# Patient Record
Sex: Male | Born: 1940
Health system: Southern US, Community
[De-identification: ages and names within clinical notes are randomized; demographics above are authoritative.]

## PROBLEM LIST (undated history)

## (undated) DIAGNOSIS — K5790 Diverticulosis of intestine, part unspecified, without perforation or abscess without bleeding: Secondary | ICD-10-CM

## (undated) DIAGNOSIS — Z9889 Other specified postprocedural states: Secondary | ICD-10-CM

## (undated) DIAGNOSIS — K635 Polyp of colon: Secondary | ICD-10-CM

## (undated) DIAGNOSIS — Z950 Presence of cardiac pacemaker: Secondary | ICD-10-CM

## (undated) DIAGNOSIS — R627 Adult failure to thrive: Secondary | ICD-10-CM

## (undated) DIAGNOSIS — I480 Paroxysmal atrial fibrillation: Secondary | ICD-10-CM

## (undated) DIAGNOSIS — G5603 Carpal tunnel syndrome, bilateral upper limbs: Secondary | ICD-10-CM

## (undated) DIAGNOSIS — E119 Type 2 diabetes mellitus without complications: Secondary | ICD-10-CM

## (undated) DIAGNOSIS — I639 Cerebral infarction, unspecified: Secondary | ICD-10-CM

## (undated) DIAGNOSIS — K279 Peptic ulcer, site unspecified, unspecified as acute or chronic, without hemorrhage or perforation: Secondary | ICD-10-CM

## (undated) DIAGNOSIS — I2510 Atherosclerotic heart disease of native coronary artery without angina pectoris: Secondary | ICD-10-CM

## (undated) DIAGNOSIS — I509 Heart failure, unspecified: Secondary | ICD-10-CM

## (undated) DIAGNOSIS — K2270 Barrett's esophagus without dysplasia: Secondary | ICD-10-CM

## (undated) DIAGNOSIS — N186 End stage renal disease: Secondary | ICD-10-CM

## (undated) DIAGNOSIS — D649 Anemia, unspecified: Secondary | ICD-10-CM

## (undated) DIAGNOSIS — Z992 Dependence on renal dialysis: Secondary | ICD-10-CM

## (undated) DIAGNOSIS — K219 Gastro-esophageal reflux disease without esophagitis: Secondary | ICD-10-CM

## (undated) DIAGNOSIS — Z92241 Personal history of systemic steroid therapy: Secondary | ICD-10-CM

## (undated) DIAGNOSIS — E785 Hyperlipidemia, unspecified: Secondary | ICD-10-CM

## (undated) DIAGNOSIS — E1142 Type 2 diabetes mellitus with diabetic polyneuropathy: Secondary | ICD-10-CM

## (undated) DIAGNOSIS — C801 Malignant (primary) neoplasm, unspecified: Secondary | ICD-10-CM

## (undated) DIAGNOSIS — D126 Benign neoplasm of colon, unspecified: Secondary | ICD-10-CM

## (undated) DIAGNOSIS — R112 Nausea with vomiting, unspecified: Secondary | ICD-10-CM

## (undated) DIAGNOSIS — F32A Depression, unspecified: Secondary | ICD-10-CM

## (undated) DIAGNOSIS — Z87442 Personal history of urinary calculi: Secondary | ICD-10-CM

## (undated) DIAGNOSIS — H3530 Unspecified macular degeneration: Secondary | ICD-10-CM

## (undated) DIAGNOSIS — H409 Unspecified glaucoma: Secondary | ICD-10-CM

## (undated) DIAGNOSIS — I350 Nonrheumatic aortic (valve) stenosis: Secondary | ICD-10-CM

## (undated) DIAGNOSIS — R0600 Dyspnea, unspecified: Secondary | ICD-10-CM

## (undated) DIAGNOSIS — R001 Bradycardia, unspecified: Secondary | ICD-10-CM

## (undated) DIAGNOSIS — I10 Essential (primary) hypertension: Secondary | ICD-10-CM

## (undated) DIAGNOSIS — I951 Orthostatic hypotension: Secondary | ICD-10-CM

## (undated) DIAGNOSIS — F329 Major depressive disorder, single episode, unspecified: Secondary | ICD-10-CM

## (undated) DIAGNOSIS — H548 Legal blindness, as defined in USA: Secondary | ICD-10-CM

## (undated) DIAGNOSIS — J302 Other seasonal allergic rhinitis: Secondary | ICD-10-CM

## (undated) HISTORY — DX: Cerebral infarction, unspecified: I63.9

## (undated) HISTORY — DX: Hyperlipidemia, unspecified: E78.5

## (undated) HISTORY — DX: Gastro-esophageal reflux disease without esophagitis: K21.9

## (undated) HISTORY — PX: BACK SURGERY: SHX140

## (undated) HISTORY — DX: Peptic ulcer, site unspecified, unspecified as acute or chronic, without hemorrhage or perforation: K27.9

## (undated) HISTORY — DX: Orthostatic hypotension: I95.1

## (undated) HISTORY — DX: Other seasonal allergic rhinitis: J30.2

## (undated) HISTORY — DX: Personal history of systemic steroid therapy: Z92.241

## (undated) HISTORY — DX: Type 2 diabetes mellitus with diabetic polyneuropathy: E11.42

## (undated) HISTORY — DX: Benign neoplasm of colon, unspecified: D12.6

## (undated) HISTORY — DX: Unspecified macular degeneration: H35.30

## (undated) HISTORY — DX: Barrett's esophagus without dysplasia: K22.70

## (undated) HISTORY — DX: Carpal tunnel syndrome, bilateral upper limbs: G56.03

## (undated) HISTORY — DX: Essential (primary) hypertension: I10

## (undated) HISTORY — PX: CYSTOSCOPY: SUR368

## (undated) HISTORY — DX: Major depressive disorder, single episode, unspecified: F32.9

## (undated) HISTORY — DX: Polyp of colon: K63.5

## (undated) HISTORY — DX: Diverticulosis of intestine, part unspecified, without perforation or abscess without bleeding: K57.90

## (undated) HISTORY — DX: Nonrheumatic aortic (valve) stenosis: I35.0

## (undated) HISTORY — DX: Depression, unspecified: F32.A

## (undated) HISTORY — DX: Anemia, unspecified: D64.9

---

## 1962-03-29 HISTORY — PX: TONSILLECTOMY: SUR1361

## 1967-03-30 HISTORY — PX: LAMINECTOMY: SHX219

## 1997-03-29 HISTORY — PX: CORNEAL TRANSPLANT: SHX108

## 1998-04-23 ENCOUNTER — Ambulatory Visit (HOSPITAL_COMMUNITY): Admission: RE | Admit: 1998-04-23 | Discharge: 1998-04-23 | Payer: Self-pay | Admitting: *Deleted

## 1998-06-19 ENCOUNTER — Ambulatory Visit (HOSPITAL_COMMUNITY): Admission: RE | Admit: 1998-06-19 | Discharge: 1998-06-19 | Payer: Self-pay | Admitting: Specialist

## 1998-06-19 ENCOUNTER — Encounter: Payer: Self-pay | Admitting: Specialist

## 1998-07-14 ENCOUNTER — Ambulatory Visit (HOSPITAL_COMMUNITY): Admission: RE | Admit: 1998-07-14 | Discharge: 1998-07-14 | Payer: Self-pay | Admitting: Specialist

## 1998-07-14 ENCOUNTER — Encounter: Payer: Self-pay | Admitting: Specialist

## 1998-07-29 ENCOUNTER — Ambulatory Visit (HOSPITAL_COMMUNITY): Admission: RE | Admit: 1998-07-29 | Discharge: 1998-07-29 | Payer: Self-pay | Admitting: Specialist

## 1998-07-29 ENCOUNTER — Encounter: Payer: Self-pay | Admitting: Specialist

## 1998-08-12 ENCOUNTER — Encounter: Payer: Self-pay | Admitting: Specialist

## 1998-08-12 ENCOUNTER — Ambulatory Visit (HOSPITAL_COMMUNITY): Admission: RE | Admit: 1998-08-12 | Discharge: 1998-08-12 | Payer: Self-pay | Admitting: Specialist

## 1999-09-17 ENCOUNTER — Encounter: Payer: Self-pay | Admitting: Internal Medicine

## 1999-09-17 ENCOUNTER — Ambulatory Visit (HOSPITAL_COMMUNITY): Admission: RE | Admit: 1999-09-17 | Discharge: 1999-09-17 | Payer: Self-pay | Admitting: Internal Medicine

## 1999-11-16 ENCOUNTER — Encounter: Payer: Self-pay | Admitting: Nephrology

## 1999-11-16 ENCOUNTER — Ambulatory Visit (HOSPITAL_COMMUNITY): Admission: RE | Admit: 1999-11-16 | Discharge: 1999-11-16 | Payer: Self-pay | Admitting: Nephrology

## 2000-06-21 ENCOUNTER — Encounter: Payer: Self-pay | Admitting: Emergency Medicine

## 2000-06-21 ENCOUNTER — Emergency Department (HOSPITAL_COMMUNITY): Admission: EM | Admit: 2000-06-21 | Discharge: 2000-06-21 | Payer: Self-pay | Admitting: Emergency Medicine

## 2000-06-28 ENCOUNTER — Ambulatory Visit (HOSPITAL_COMMUNITY): Admission: RE | Admit: 2000-06-28 | Discharge: 2000-06-28 | Payer: Self-pay | Admitting: Internal Medicine

## 2000-06-28 ENCOUNTER — Encounter: Payer: Self-pay | Admitting: Internal Medicine

## 2001-07-27 DIAGNOSIS — D126 Benign neoplasm of colon, unspecified: Secondary | ICD-10-CM

## 2001-07-27 HISTORY — DX: Benign neoplasm of colon, unspecified: D12.6

## 2001-08-03 ENCOUNTER — Encounter (INDEPENDENT_AMBULATORY_CARE_PROVIDER_SITE_OTHER): Payer: Self-pay | Admitting: Specialist

## 2001-08-03 ENCOUNTER — Ambulatory Visit (HOSPITAL_COMMUNITY): Admission: RE | Admit: 2001-08-03 | Discharge: 2001-08-03 | Payer: Self-pay | Admitting: *Deleted

## 2003-01-24 ENCOUNTER — Inpatient Hospital Stay (HOSPITAL_COMMUNITY): Admission: EM | Admit: 2003-01-24 | Discharge: 2003-01-30 | Payer: Self-pay | Admitting: Emergency Medicine

## 2003-01-25 ENCOUNTER — Encounter: Payer: Self-pay | Admitting: Internal Medicine

## 2003-02-07 ENCOUNTER — Inpatient Hospital Stay (HOSPITAL_COMMUNITY): Admission: EM | Admit: 2003-02-07 | Discharge: 2003-02-10 | Payer: Self-pay | Admitting: Emergency Medicine

## 2003-02-08 ENCOUNTER — Encounter: Payer: Self-pay | Admitting: Cardiology

## 2003-04-26 ENCOUNTER — Encounter: Admission: RE | Admit: 2003-04-26 | Discharge: 2003-04-26 | Payer: Self-pay | Admitting: Internal Medicine

## 2003-04-29 ENCOUNTER — Ambulatory Visit (HOSPITAL_COMMUNITY): Admission: RE | Admit: 2003-04-29 | Discharge: 2003-04-29 | Payer: Self-pay | Admitting: Neurology

## 2003-05-23 ENCOUNTER — Encounter: Admission: RE | Admit: 2003-05-23 | Discharge: 2003-05-23 | Payer: Self-pay | Admitting: *Deleted

## 2003-05-28 DIAGNOSIS — K2270 Barrett's esophagus without dysplasia: Secondary | ICD-10-CM

## 2003-05-28 HISTORY — DX: Barrett's esophagus without dysplasia: K22.70

## 2003-05-29 ENCOUNTER — Encounter: Admission: RE | Admit: 2003-05-29 | Discharge: 2003-07-24 | Payer: Self-pay | Admitting: Internal Medicine

## 2003-06-12 ENCOUNTER — Ambulatory Visit (HOSPITAL_COMMUNITY): Admission: RE | Admit: 2003-06-12 | Discharge: 2003-06-12 | Payer: Self-pay | Admitting: *Deleted

## 2003-06-12 ENCOUNTER — Encounter (INDEPENDENT_AMBULATORY_CARE_PROVIDER_SITE_OTHER): Payer: Self-pay | Admitting: Specialist

## 2004-08-25 ENCOUNTER — Ambulatory Visit (HOSPITAL_COMMUNITY): Admission: RE | Admit: 2004-08-25 | Discharge: 2004-08-25 | Payer: Self-pay | Admitting: *Deleted

## 2004-08-25 ENCOUNTER — Encounter (INDEPENDENT_AMBULATORY_CARE_PROVIDER_SITE_OTHER): Payer: Self-pay | Admitting: *Deleted

## 2004-10-21 ENCOUNTER — Ambulatory Visit: Payer: Self-pay

## 2004-11-14 ENCOUNTER — Encounter: Admission: RE | Admit: 2004-11-14 | Discharge: 2004-11-14 | Payer: Self-pay | Admitting: Neurology

## 2005-08-13 ENCOUNTER — Encounter (INDEPENDENT_AMBULATORY_CARE_PROVIDER_SITE_OTHER): Payer: Self-pay | Admitting: *Deleted

## 2005-08-13 ENCOUNTER — Ambulatory Visit (HOSPITAL_COMMUNITY): Admission: RE | Admit: 2005-08-13 | Discharge: 2005-08-13 | Payer: Self-pay | Admitting: *Deleted

## 2006-11-02 ENCOUNTER — Encounter (INDEPENDENT_AMBULATORY_CARE_PROVIDER_SITE_OTHER): Payer: Self-pay | Admitting: *Deleted

## 2006-11-02 ENCOUNTER — Ambulatory Visit (HOSPITAL_COMMUNITY): Admission: RE | Admit: 2006-11-02 | Discharge: 2006-11-02 | Payer: Self-pay | Admitting: *Deleted

## 2007-03-30 HISTORY — PX: AV FISTULA PLACEMENT: SHX1204

## 2007-08-24 ENCOUNTER — Ambulatory Visit: Payer: Self-pay | Admitting: *Deleted

## 2007-09-18 ENCOUNTER — Ambulatory Visit (HOSPITAL_COMMUNITY): Admission: RE | Admit: 2007-09-18 | Discharge: 2007-09-18 | Payer: Self-pay | Admitting: *Deleted

## 2007-09-18 ENCOUNTER — Ambulatory Visit: Payer: Self-pay | Admitting: *Deleted

## 2007-10-04 ENCOUNTER — Ambulatory Visit: Payer: Self-pay | Admitting: Vascular Surgery

## 2008-03-17 ENCOUNTER — Emergency Department (HOSPITAL_COMMUNITY): Admission: EM | Admit: 2008-03-17 | Discharge: 2008-03-17 | Payer: Self-pay | Admitting: Emergency Medicine

## 2008-09-04 ENCOUNTER — Ambulatory Visit (HOSPITAL_COMMUNITY): Admission: RE | Admit: 2008-09-04 | Discharge: 2008-09-04 | Payer: Self-pay | Admitting: *Deleted

## 2008-09-04 ENCOUNTER — Encounter (INDEPENDENT_AMBULATORY_CARE_PROVIDER_SITE_OTHER): Payer: Self-pay | Admitting: *Deleted

## 2009-04-28 ENCOUNTER — Encounter: Admission: RE | Admit: 2009-04-28 | Discharge: 2009-04-28 | Payer: Self-pay | Admitting: Endocrinology

## 2010-05-21 ENCOUNTER — Encounter (HOSPITAL_COMMUNITY): Payer: Medicare Other | Attending: Nephrology

## 2010-05-21 DIAGNOSIS — D509 Iron deficiency anemia, unspecified: Secondary | ICD-10-CM | POA: Insufficient documentation

## 2010-05-28 ENCOUNTER — Encounter (HOSPITAL_COMMUNITY): Payer: Medicare Other | Attending: Nephrology

## 2010-05-28 DIAGNOSIS — D509 Iron deficiency anemia, unspecified: Secondary | ICD-10-CM | POA: Insufficient documentation

## 2010-07-06 LAB — GLUCOSE, CAPILLARY: Glucose-Capillary: 96 mg/dL (ref 70–99)

## 2010-08-11 NOTE — Assessment & Plan Note (Signed)
OFFICE VISIT   JUNO, BOZARD  DOB:  1940/10/23                                       10/04/2007  [id number]#:[id number]   The patient returns for follow-up today after placement of a left  radiocephalic AV fistula on 09/18/2007.  On exam today, there is an  easily palpable thrill in the fistula.  The vein is maturing well on the  left volar forearm.  He does have a small amount of numbness in his left  thumb.  Hopefully this will resolve over time.  Overall, the fistula  seems to be maturing well.  He will follow up on an as-needed basis.   Janetta Hora. Fields, MD  Electronically Signed   CEF/MEDQ  D:  11/16/2007  T:  11/16/2007  Job:  [id number]

## 2010-08-11 NOTE — Consult Note (Signed)
VASCULAR SURGERY CONSULTATION   MORT, Raiford C  DOB:  09/10/1940                                       08/24/2007  [id number]#:[id number]   REFERRING PHYSICIAN:  Cecille Aver, M.D.   PRIMARY CARE PHYSICIAN:  Soyla Murphy. Renne Crigler, M.D.   REASON FOR CONSULTATION:  Chronic renal insufficiency.   HISTORY:  The patient is a 70 year old gentleman with a long history of  chronic kidney disease.  This is now stage IV.  This has progressed very  slowly.  He does complain of some fatigue.  He has chosen hemodialysis  and is referred at this time for access.   PAST MEDICAL HISTORY:  1. Hypertension.  2. Type 2 diabetes.  3. Hyperlipidemia.  4. Stage IV chronic kidney disease.  5. Anemia.  6. Secondary hyperparathyroidism.  7. CVA.   MEDICATIONS:  1. Micardis 80 mg daily.  2. Clonidine 0.1 mg b.i.d.  3. Prilosec 20 mg daily.  4. Plavix 75 mg daily.  5. Lipitor 40 mg daily.  6. Allegra 180 mg daily.  7. Actos 30 mg daily.  8. Zetia 10 mg daily at bedtime.  9. Fluoxetine 40 mg daily.  10.Furosemide 80 mg daily.  11.Toprol 25 mg daily.  12.Hectorol 0.5 mg b.i.d.  13.Iron 1 tablet daily.   ALLERGIES:  Penicillin which causes hives.   SOCIAL HISTORY:  The patient is married with three grown children.  He  is retired.  Does not smoke.  Discontinued tobacco use 6 years ago.  No  alcohol use.   REVIEW OF SYSTEMS:  Refer to patient encounter form, no major  complaints.   FAMILY HISTORY:  Noncontributory.   PHYSICAL EXAMINATION:  General:  A 70 year old male.  Alert and  oriented.  Mild expressive dysphasia.  No acute distress.  Vital signs:  BP 150/86, pulse 51 per minute.  Extremities:  Upper extremities 2+  brachial, radial pulses bilaterally.   INVESTIGATIONS:  Upper extremity vein mapping reveals patent bilateral  cephalic veins measuring from 4.4-0.3 cm bilaterally.   FINAL IMPRESSION:  1. Chronic kidney disease stage IV.  2. Hypertension.  3.  Hyperlipidemia.  4. Type 2 diabetes.  5. Cerebrovascular accident.   PLAN:  The patient will be scheduled for left Cimino arteriovenous  fistula to be performed 09/18/2007 at [facility].  Discontinue  Plavix 1 week prior to surgery and bridge with aspirin 325 mg daily.   Balinda Quails, M.D.  Electronically Signed  PGH/MEDQ  D:  08/24/2007  T:  08/25/2007  Job:  [id number]   cc:   Soyla Murphy. Renne Crigler, M.D.  Cecille Aver, M.D.

## 2010-08-11 NOTE — Op Note (Signed)
NAMEMONTY, SPICHER NO.:  [id number]   MEDICAL RECORD NO.:  [id number]          PATIENT TYPE:  AMB   LOCATION:  ENDO                         FACILITY:  [facility]   PHYSICIAN:  Georgiana Spinner, M.D.    DATE OF BIRTH:  08-21-1940   DATE OF PROCEDURE:  09/04/2008  DATE OF DISCHARGE:                               OPERATIVE REPORT   PROCEDURE:  Upper endoscopy with biopsy.   INDICATIONS:  Gastroesophageal reflux disease.   ANESTHESIA:  Fentanyl 50 mcg, Versed 5 mg.   PROCEDURE IN DETAIL:  With the patient mildly sedated in the left  lateral decubitus position the Pentax videoscopic endoscope was inserted  in the mouth and passed under direct vision through the esophagus which  appeared normal.  I really could not see a clear evidence of Barrett's  esophagus but I elected to biopsy around the periphery of the  squamocolumnar junction.  The endoscope was then advanced into the  stomach.  Fundus, body, antrum, duodenal bulb, second portion of  duodenum were visualized.  From this point the endoscope was slowly  withdrawn taking circumferential views of duodenal mucosa until the  endoscope had been pulled back into the stomach, placed in retroflexion  to view the stomach from below.  The endoscope was straightened and  withdrawn taking circumferential views of remaining gastric and  esophageal mucosa.  The patient's vital signs and pulse oximeter  remained stable.  The patient tolerated the procedure well without  apparent complication.   FINDINGS:  Question of Barrett's esophagus, biopsied.  Await biopsy  report.  The patient will call me for results and follow up with me as  an outpatient.           ______________________________  Georgiana Spinner, M.D.     GMO/MEDQ  D:  09/04/2008  T:  09/04/2008  Job:  [id number]

## 2010-08-11 NOTE — Procedures (Signed)
CEPHALIC VEIN MAPPING   INDICATION:  End-stage renal disease.   HISTORY:  End-stage renal disease.   EXAM:  Bilateral cephalic vein duplex.   The right cephalic vein is compressible.   Diameter measurements range from 0.33 to 0.64.   The left cephalic vein is compressible.   Diameter measurements range from 0.44 to 0.60   See attached worksheet for all measurements.   IMPRESSION:  Patent bilateral cephalic veins which are of acceptable  diameter for use as a dialysis access site.   ___________________________________________  P. Liliane Bade, M.D.   MG/MEDQ  D:  08/24/2007  T:  08/24/2007  Job:  [id number]

## 2010-08-11 NOTE — Op Note (Signed)
[name], [name] NO.:  [id number]   MEDICAL RECORD NO.:  [id number]          PATIENT TYPE:  AMB   LOCATION:  ENDO                         FACILITY:  [facility]   PHYSICIAN:  Georgiana Spinner, M.D.    DATE OF BIRTH:  [birth year]   DATE OF PROCEDURE:  11/02/2006  DATE OF DISCHARGE:                               OPERATIVE REPORT   PROCEDURE:  Upper endoscopy.   INDICATIONS:  GERD.   ANESTHESIA:  1. Fentanyl 50 mcg.  2. Versed 5 mg.   PROCEDURE:  With the patient mildly sedated in the left lateral  decubitus position, the Pentax videoscopic endoscope was inserted in the  mouth and passed under direct vision through the esophagus, which  appeared normal, except there was maybe one small area of Barrett's that  I could see.  This was photographed and biopsied.  We entered into the  stomach.  The fundus, body, antrum, duodenal bulb, and second portion of  the duodenum were visualized. From this point, the endoscope was slowly  withdrawn, taking circumferential views of the duodenal mucosa until the  endoscope had been pulled back into the stomach and placed in  retroflexion to view the stomach from below. The endoscope was then  straightened and withdrawn, taking circumferential views of the  remaining gastric and esophageal mucosa, stopping in the fundus of the  stomach, where undigested food particles had appeared.  These were  fairly recently and just were seen, photographed, and the fundus was  biopsied because it appeared that it may possibly have some degree of  gastritis.  The patient's vital signs and pulse oximeter remained  stable.  The patient tolerated procedure well, without apparent  complications.   FINDINGS:  Apparent small area of Barrett's esophagus, biopsied.  Gastric biopsies taken.  Await biopsy report.  The patient will call me  for results and follow up with me as an outpatient.           ______________________________  Georgiana Spinner,  M.D.     GMO/MEDQ  D:  11/02/2006  T:  11/02/2006  Job:  [id number]

## 2010-08-11 NOTE — Op Note (Signed)
NAMECHRISTOPHR, CALIX                ACCOUNT NO.:  [id number]   MEDICAL RECORD NO.:  [id number]          PATIENT TYPE:  AMB   LOCATION:  SDS                          FACILITY:  [facility]   PHYSICIAN:  Janetta Hora. Fields, MD  DATE OF BIRTH:  11/01/1940   DATE OF PROCEDURE:  09/18/2007  DATE OF DISCHARGE:  09/18/2007                               OPERATIVE REPORT   PROCEDURE:  Left radiocephalic arteriovenous fistula.   PREOPERATIVE DIAGNOSIS:  End-stage renal disease.   POSTOPERATIVE DIAGNOSIS:  End-stage renal disease.   ANESTHESIA:  Local with IV sedation.   ASSISTANT:  Ezzard Flax, RNFA.   OPERATIVE DETAILS:  After obtaining informed consent, the patient taken  to the operating.  The patient placed in supine position on the  operating table.  After adequate sedation, the patient's entire left  upper extremity was prepped and draped in usual sterile fashion.  Local  anesthesia was infiltrated near the wrist.  A longitudinal incision was  made midway between the cephalic vein and radial artery at the wrist on  the left arm.  Incision was carried down through the subcutaneous  tissues down to the level of cephalic vein.  Cephalic vein was dissected  free circumferentially.  Small side branches were ligated and divided  between silk ties.  Next, the radial artery was dissected free in the  medial portion of the incision.  Vessel loops were placed proximal and  distal to the planned site of arteriotomy.  The patient was then given  5000 units of intravenous heparin.  This cephalic vein was ligated with  a 2-0 silk tie and transected.  It was gently distended with heparinized  saline and found to be 3 to 3.5 mm in diameter.  It was then marked for  orientation and occluded proximally with a fine bulldog clamp.  Vessel  loops were used to control the radial artery.  Longitudinal opening was  made in the radial artery.  The vein was swung over to the level of the  artery and sewn end of  vein to side of artery using a running 7-0  Prolene suture.  Just prior to completion, anastomosis was fore bled,  back bled, and thoroughly flushed.  Anastomosis was secured.  Vessel  loops were released.  There was palpable thrill in the fistula  immediately.  Next, hemostasis was obtained.  Subcutaneous tissues  reapproximated using running 3-0 Vicryl suture.  Skin was closed with 4-  0 Vicryl subcuticular stitch.  The patient tolerated procedure well and  there were no complications.  Instrument, sponge, and needle counts were  correct at the end of the case.  The patient was taken to recovery room  in stable condition.      Janetta Hora. Fields, MD  Electronically Signed     CEF/MEDQ  D:  09/18/2007  T:  09/19/2007  Job:  [id number]

## 2010-08-14 NOTE — Op Note (Signed)
Alexander [name], Alexander [name] NO.:  [id number]   MEDICAL RECORD NO.:  [id number]          PATIENT TYPE:  AMB   LOCATION:  ENDO                         FACILITY:  [facility]   PHYSICIAN:  Georgiana Spinner, M.D.    DATE OF BIRTH:  10-May-1940   DATE OF PROCEDURE:  08/13/2005  DATE OF DISCHARGE:                                 OPERATIVE REPORT   PROCEDURE:  Colonoscopy.   INDICATIONS:  Colon polyps.   ANESTHESIA:  Demerol 60, Versed 8 mg.   PROCEDURE:  With the patient mildly sedated in the left lateral decubitus  position, a rectal examination was attempted.  Subsequently, the Olympus  videoscopic colonoscope was inserted into the rectum and passed under direct  vision to the cecum identified by ileocecal valve and appendiceal orifice,  both of which were photographed.  From this point, the colonoscope was  slowly withdrawn taking circumferential views of colonic mucosa, stopping at  60 cm from anal verge which I judged to be the descending colon, at which  point a polyp was seen photographed and removed using hot biopsy forceps  technique setting of 20/200 blended current.  We next stopped to photograph  diverticulosis seen in the sigmoid colon until we reached the rectum which  appeared normal on direct and showed hemorrhoids on retroflexed view.  The  endoscope was straightened and withdrawn.  The patient's vital signs and  pulse oximeter remained stable.  The patient tolerated the procedure well  without apparent complications.   FINDINGS:  Internal hemorrhoids, diverticulosis of sigmoid colon.  Intermediate size polyp, approximately 7 mm in size, in the descending  colon.   PLAN:  Await biopsy report.  The patient will call me for results and follow  up with me as an outpatient.           ______________________________  Georgiana Spinner, M.D.     GMO/MEDQ  D:  08/13/2005  T:  08/13/2005  Job:  [id number]

## 2010-08-14 NOTE — Procedures (Signed)
[facility]  Patient:    Alexander [name], Alexander [name] Visit Number: [id number] MRN: [id number]          Service Type: END Location: ENDO Attending Physician:  Sabino Gasser Dictated by:   Sabino Gasser, M.D. Proc. Date: 08/03/01 Admit Date:  08/03/2001                             Procedure Report  PROCEDURE:  Colonoscopy.  INDICATIONS:  Colon polyps.  ANESTHESIA:  Demerol 30, Versed 3 mg.  DESCRIPTION OF PROCEDURE:  With the patient mildly sedated in the left lateral decubitus position, the Olympus videoscopic colonoscope was inserted into the rectum and passed under direct vision to the cecum, identified by the ileocecal valve and appendiceal orifice.  From this point, the colonoscope was slowly withdrawn, taking  circumferential views of the remaining colonic mucosa, stopping only first in the ascending colon where a small polyp was seen and removed using hot biopsy forceps technique, setting of 20-20 blended current, in the proximal transverse colon where a slightly larger polyp was seen and photographed and removed using snare cautery technique, again a setting of 20-20 blended current.  This was done until we reached the rectum which showed changes of hemorrhoids on retroflex view.  The endoscope was straightened and withdrawn.  The patients vital signs and pulse oximeter remained stable.  The patient tolerated the procedure well without apparent complications.  FINDINGS: 1. Diverticulosis of sigmoid colon, fairly mild. 2. Polyp of ascending colon and proximal transverse colon, removed.  PLAN:  Await biopsy report.  The patient will call me for results and follow up with me as an outpatient.  See endoscopy note for further details as well. Dictated by:   Sabino Gasser, M.D. Attending Physician:  Sabino Gasser DD:  08/03/01 TD:  08/04/01 Job: [id number]

## 2010-08-14 NOTE — Discharge Summary (Signed)
NAME:  Alexander [name], Alexander [name]                          ACCOUNT NO.:  [id number]   MEDICAL RECORD NO.:  [id number]                   PATIENT TYPE:  INP   LOCATION:  2024                                 FACILITY:  [facility]   PHYSICIAN:  Norwood Levo, MD               DATE OF BIRTH:  1940/04/01   DATE OF ADMISSION:  02/07/2003  DATE OF DISCHARGE:  02/10/2003                                 DISCHARGE SUMMARY   PRIMARY CARE PHYSICIAN:  Dr. Soyla Murphy. Pharr   NEUROLOGIST:  Dr. Pearlean Brownie   NEPHROLOGIST:  Dr. Lavera Guise and Dr. Eliott Nine   CHIEF COMPLAINT:  Transient substernal chest pain, loss of consciousness,  right foot pain.   HISTORY OF PRESENT ILLNESS:  This 70 year old white male with a past medical  history significant for recent acute left brain stroke on January 16, 2003,  diabetes mellitus and blindness secondary to macular degeneration.  He  presents from home to the emergency room at [facility] after having passed  out at home secondary to a physical therapy exercise just minutes  previously.  The patient was trying to get up, felt lightheaded, and has  weak legs and buckled to the floor. This was witnessed by his daughter.  The  patient fell, traumatized his head to the floor and injured his right  shoulder and foot. The patient did not loose consciousness, but his daughter  did state that the patient's eyes rolled back into his head as he fell to  the floor. He did wake up almost immediately and oriented himself to his  wife. There was no reported seizure activity at that time.  There was  no  incontinence of bladder or bowel and no laceration or trauma to the head.  The patient had, at that time, no recollection of the event secondary to  short-term memory loss associated with the previous stroke.   He was later evaluated in Dr. Carolee Rota office and also complained of some  chest pain then and given a sublingual nitroglycerin in his office with pain  relief, and thus the patient was sent  to the emergency room for further  evaluation.  Of note is that the family had stated that he has poor  appetite.  He felt chronically dizzy secondary to dysequilibrium post  stroke.  He is blind and also was quite debilitated.  Patient at this time  did not use cane/walker for ambulation.  The patient denied any dysphagia,  any nausea, vomiting, diarrhea, or any headache.  Denies any melena or  bright red blood per rectum.  Denies any painful urination.   PAST MEDICAL HISTORY:  1. Status post acute left posterior communicating infarct on January 24, 2003.  MRI of the brain, last done January 26, 2003:  The patient has     residual, short-term memory loss and mild right-sided weakness.  2. Left internal carotid occlusion  per MRI of brain of 100%.  3. Hypertension.  4. Diabetes mellitus 2, hemoglobin A1c 6.6 in November 2004.  5. Hyperlipidemia.  6. Elevated homocystine.  7. Macular degeneration, legal blindness.  8. History of tobacco quite 3 years ago.  9. Recent sinus infection 1 month previously.  10.      Chronic renal insufficiency seen by Dr. Eliott Nine.  Baseline     creatinine between 2 and 3.   PAST SURGICAL HISTORY:  1. Status post low back surgery x2, status post tonsillectomy.  2. Status post colonic polyps, resected by Dr. Virginia Rochester.  3. Status post colonoscopy in May 2003.  4. Known history of diverticulosis and esophagitis.   MEDICATIONS ON ADMISSION:  1. Aspirin 325 p.o. daily.  2. Lopressor 100 mg p.o. b.i.d.  3. Avandia 4 mg p.o. daily.  4. Accupril 20 mg p.o. daily.  5. Pravachol 80 mg p.o. daily.  6. Clarinex 5 mg p.o. daily.  7. Micardis/HCTZ 80/12.5 mg p.o. b.i.d.  8. Catapres 0.2 mg p.o. t.i.d.  9. Plavix 75 p.o. daily.  10.      Foltx 1 tab p.o. daily.  11.      Tylenol p.r.n.  12.      Fish oil capsules 1-2 per day.  13.      Darvocet-N 100 1/2 tab q.6h. p.r.n. for pain.   ALLERGIES:  PENICILLIN.   SOCIAL HISTORY, FAMILY HISTORY, AND REVIEW OF SYSTEMS:   As per HPE.   PHYSICAL EXAMINATION:  Physical examination on admission is of note for mild  intense tremor of the right, but no motor sensory loss.  The gait was not  assessed at that time.   Orthostatic vital signs were noted with supine BP of 106/65 with a pulse of  60; standing BP of 99/45 with a pulse of 64; borderline orthostatic changes.   ADMITTING LABORATORIES:  Head CT shows old LPCA infarct with no acute  changes.  Chest x-ray shows no acute disease.  EKG shows sinus bradycardia  at a rate of 55 with nonspecific ST T-wave abnormalities.  Laboratories done  on admission were negative.   HOSPITAL COURSE BY PROBLEMS:  1. Cardiovascular:  Presyncope/syncope. At the time of admission, the     patient appears to be borderline orthostatic, dehydrated per BUN and     creatinine levels well above his baseline.  The creatinine as high as 3.4     and uremia.  He was placed on telemetry.  He underwent serial cardiac     enzymes--all negative.  Echocardiogram was performed and was also     negative with no wall motion abnormalities or valvular defect.  An     ejection fraction of approximately 55%.  During his hospitalization the     patient did have 2 brief runs of PSVT with normalization within seconds.     This improved with low dose Lopressor and hydration.  The patient had no     further symptoms of atypical angina.  Of course, with his short-term     memory loss it is very difficult to evaluate.  Certainly, from the     clinical standpoint, he did not appear to be having any immediate     symptomatology associated to ACS.  All his preadmission cardiac     medications were withheld because of his dehydrated state and suspected     orthostatic/vagal episode.  The only two reinstituted were Lopressor at     25 mg b.i.d. and Catapres  0.1 p.o. b.i.d. both reduced dramatically from    their original dosing.  Micardis and Accupril were held because of the     acute renal  insufficiency.   1. Renal: Acute renal failure, acute renal insufficiency, with good urine     output on chronic insufficiency.  Dr. Lavera Guise saw the patient for Dr.     Eliott Nine.  The patient was hydrated with good reduction in uremia and     lowering of creatinine from 3.4 to 2.9 on the day of discharge with good     down trending resolution. Once again, ACE inhibitors and diuretics were     held.  Urinalysis, urine sodium and creatinine were normal. The renal     ultrasound was not performed at this time, and the suggestion was to do     either a renal Doppler or renal MRA to rule out RAS in the patient.     This, in view of his history of atherosclerotic disease, although a     strong suspicion of __________, but there is an underlying diabetic     nephropathy.  Further workup will be performed on the patient as an     outpatient.   1. Neurology:  Status post LPCA CVA.  Romberg testing was negative.  It is     unclear whether there is a cerebellar involvement and that explains his     dysequilibrium.  Dr. Pearlean Brownie did see the patient and recommended possibly     allowing a slightly higher SVT baseline target at approximately 135-130.     Thus, Lopressor and Catapres were reinstituted for this purpose.  The     patient was continued on aspirin, Plavix and Pravachol; as well as Foltx.     The patient did not have any further episodes consistent with recurrent     CVA and TIA and no seizure activity.  The patient will be seen in     approximately 3-4 weeks as an outpatient by Dr. Pearlean Brownie for elective     catheter cerebral angiogram to further evaluate the LICA and LVA.   1. Endocrinology:  NIDDM.  CBGs were stable throughout and the patient was     continued on Avandia without any complications.  Past control has been     very good with hemoglobin A1c of 6.6.   DISCHARGE DIAGNOSES:  1. Orthostatism/vasovagal episode of near syncope.  2. Transient hypotension.  3. Acute renal insufficiency on  chronic renal insufficiency.  4. Status post acute LPCA infarct with residual short-term memory loss and     slight right-sided weakness and tremor and dysequilibrium.  5. Non-insulin-dependent diabetes mellitus.   MEDICATIONS ON DISCHARGE:  1. ASA 325 mg p.o. daily.  2. Plavix 75 mg p.o. daily.  3. MiraLax 17 gm p.o. daily.  4. Pravachol 80 mg p.o. q.h.s.  5. Foltx 1 tab p.o. daily.  6. Avandia 4 mg p.o. daily.  7. Lopressor 25 mg p.o. b.i.d.  8. Darvocet 100/650 one tab p.o. q.8h. p.r.n.  9. Catapres 0.1 mg p.o. b.i.d. (hold Accupril and Micardis/HCTZ until seen     by Dr. Renne Crigler this week.   DIETARY RESTRICTIONS:  ADA 1800 calorie diet with cardiac 2 diet.   ACTIVITY:  The patient will continue at home with physical therapy.  He was  evaluated by PT and OT during this hospitalization.  He requires no OT at  this time.  The patient's family has already had a PT home safety check  performed and has most of the necessary equipment to aide in the house.   DISCHARGE INSTRUCTIONS:  1. Follow up with Dr. Pearlean Brownie in 3-4 weeks for cerebral angiogram, call for     appointment.  2. Follow up with Dr. Lavera Guise or Eliott Nine in 2-4 weeks; call for appointment.  3. Follow up with Dr. Renne Crigler this week call for an appointment.                                                Norwood Levo, MD    APM/MEDQ  D:  02/10/2003  T:  02/10/2003  Job:  [id number]   cc:   Dr. Viann Shove, M.D.  [address] [place], [place] [address]  Fax: [phone]   Duke Salvia. Eliott Nine, M.D.  [address]  [place]  [place] [address]  Fax: [phone]   Soyla Murphy. Renne Crigler, M.D.  [address]  [place]  [place] [address]  Fax: [phone]

## 2010-08-14 NOTE — Op Note (Signed)
NAMETIRAS, BIANCHINI NO.:  [id number]   MEDICAL RECORD NO.:  [id number]          PATIENT TYPE:  AMB   LOCATION:  ENDO                         FACILITY:  [facility]   PHYSICIAN:  Georgiana Spinner, M.D.    DATE OF BIRTH:  Jul 11, 1940   DATE OF PROCEDURE:  08/25/2004  DATE OF DISCHARGE:                                 OPERATIVE REPORT   PROCEDURE:  Upper endoscopy.   ENDOSCOPIST:  Georgiana Spinner, M.D.   INDICATIONS:  GERD.   ANESTHESIA:  Demerol 50 mg, Versed 5 mg.   PROCEDURE:  With the patient mildly sedated in the left lateral decubitus  position, the Olympus videoscopic endoscope was inserted into the mouth and  passed under direct vision through the esophagus, which appeared normal.  The distal esophagus was approached and we photographed and biopsied this  area to assess for Barrett's.  We entered into the stomach and it was noted  that the fundus was somewhat erythematous and thickened, and it was  photographed and biopsied.  We advanced to the body and antrum, which  appeared normal, as did duodenal bulb and second portion of duodenum.  From  this point, the endoscope was slowly withdrawn, taking circumferential views  of duodenal mucosa until the endoscope had been pulled back into the stomach  and placed in retroflexion to view the stomach from below.  The endoscope  was straightened and withdrawn, taking circumferential views of remaining  gastric and esophageal mucosa.  The patient's vital signs and pulse oximetry  remained stable.  The patient tolerated the procedure well without apparent  complications.   FINDINGS:  Erythema of fundus of stomach, biopsied; biopsies of distal  esophagus; await biopsy report.   PLAN:  The patient will call me for results and follow up with me as an  outpatient      GMO/MEDQ  D:  08/25/2004  T:  08/25/2004  Job:  [id number]

## 2010-08-14 NOTE — Procedures (Signed)
[facility]  Patient:    Alexander [name], Alexander [name] Visit Number: [id number] MRN: [id number]          Service Type: END Location: ENDO Attending Physician:  Sabino Gasser Dictated by:   Sabino Gasser, M.D. Proc. Date: 08/03/01 Admit Date:  08/03/2001                             Procedure Report  PROCEDURE:  Upper endoscopy.  INDICATIONS:  GERD.  ANESTHESIA:  Demerol 60, Versed 6 mg.  DESCRIPTION OF PROCEDURE:  With patient mildly sedated in the left lateral decubitus position, the Olympus videoscopic endoscope was inserted into the mouth and passed under direct vision through the esophagus into the stomach and, subsequently, duodenal bulb and second portion of duodenum.  From this point, the endoscope was slowly withdrawn, taking circumferential views of the entire duodenal mucosa until the endoscope then pulled back into the stomach and placed in retroflexion to view the stomach from below.  The endoscope was then straightened and withdrawn, taking circumferential views of the remaining gastric and esophageal mucosa, stopping in the stomach where some changes of gastritis were seen in body and fundus.  These were photographed and biopsied. as well as changes of what appeared to be a localized esophagitis in the distal esophagus that was again photographed and biopsied.  The patients vital signs and pulse oximeter remained stable.  The patient tolerated the procedure well without apparent complications.  FINDINGS:  Changes of gastritis and possible esophagitis, biopsied.  Await biopsy report.  The patient will call me for results and follow up with me as an outpatient.  Proceed to colonoscopy as planned. Dictated by:   Sabino Gasser, M.D. Attending Physician:  Sabino Gasser DD:  08/03/01 TD:  08/04/01 Job: [id number]

## 2010-08-14 NOTE — H&P (Signed)
NAME:  Alexander [name], Alexander [name]                          ACCOUNT NO.:  [id number]   MEDICAL RECORD NO.:  [id number]                   PATIENT TYPE:  INP   LOCATION:  2024                                 FACILITY:  [facility]   PHYSICIAN:  Elliot Cousin, M.D.                 DATE OF BIRTH:  January 14, 1941   DATE OF ADMISSION:  02/07/2003  DATE OF DISCHARGE:                                HISTORY & PHYSICAL   PRIMARY CARE PHYSICIAN:  Dr. Soyla Murphy. Pharr.   CHIEF COMPLAINT:  Transient substernal chest pain, passed out, right  shoulder and right foot pain secondary to fall.   HISTORY OF PRESENT ILLNESS:  Mr. Koudelka is a 70 year old man with a past  medical history significant for an acute left brain stroke, January 24, 2003, diabetes mellitus, and blindness secondary to macular degeneration,  who presented to the emergency department today with an episode of passing  out transiently at home.  The patient was completing physical therapy today.  After the physical therapist left, he rested on the couch.  He attempted to  get up and became lightheaded, his legs became weak, and he buckled to the  floor.  His daughter witnessed this.  She tried to catch his fall but could  not.  The patient fell and hit his head on the floor and apparently fell on  his right side and hurt his shoulder and his right foot.  When the patient  hit the floor, he did not lose consciousness.  According to the daughter,  the patient stood up and transiently his eyes rolled back into his head and  then he fell to the floor.  However, when he hit the floor, he did wake up.  He was oriented to himself and to his daughter.  There was no reported  seizure activity.  There was no incontinence of bladder and bowel.  The  patient hit the back of his head, which was somewhat sore earlier, but on  exam tonight, the patient denies any pain or any area on the back of his  head that is swollen.   The patient had a routine followup  appointment with Dr. Renne Crigler today.  When  he arrived to the office, he complained of some substernal chest pain.  He  was given a sublingual nitroglycerin in the office which relieved his pain.  When the patient is asked about chest pain tonight in the emergency  department, he does not recall having chest pain.  He is currently chest-  pain-free.  His only complaint is some tenderness in the right shoulder and  some discomfort in his right leg.  The patient denies any associated  diaphoresis or nausea with the chest pain.  He denies any radiation of the  chest pain.   The patient's family states that the patient's appetite has been poor over  the past week or so.  He is generally more weak on his right side than he is  on his left side.  He does not use a cane or a walker for ambulation.  He is  chronically dizzy, primarily not from vertigo but from dysequilibrium;  this is secondary to the stroke, per family.  The patient is also blind from  macular degeneration.  However, he does not use a cane or walker to  ambulate.  The patient does not complain of a headache now.  He has had no  recent fever or chills.  No dysphagia.  No abdominal pain, nausea, vomiting  or diarrhea.  He does have some constipation.  He denies any melena or  bright red blood per rectum (per wife's report).  He denies any painful  urination.  He denies any increase in swelling in his legs or his knees over  the past few weeks.  He does have chronic shortness of breath which appears  to have started after his stroke two weeks ago.   PAST MEDICAL HISTORY:  1. Acute left posterior communicating artery infarction on January 24, 2003     per MRI of the brain on January 26, 2003.     a. The patient has residual short-term memory loss and mild right-sided        weakness.  2. Left internal carotid artery occlusion per MRA of the brain, January 26, 2003.  3. Hypertension.  4. Type 2 diabetes mellitus.  Hemoglobin A1c  6.6 in early November 2004.  5. Hyperlipidemia.  6. Elevated homocysteine level.  7. Macular degeneration causing legal blindness.  8. History of tobacco abuse, quit three years ago.  9. History of recent sinus infection approximately one month ago.  10.      Chronic renal insufficiency (has seen Dr. Aram Beecham B. Eliott Nine in the     past).  Baseline creatinine appears to be between 2 and 3.  11.      Status post low back surgery x2 in the past.  12.      Status post tonsillectomy as a young man.  13.      The patient has a history of diverticulosis of the sigmoid colon     and polyps of the ascending colon and proximal transverse colon which     were removed per Dr. Melrose Nakayama. Virginia Rochester per colonoscopy in May of 2003.  14.      Chronic gastritis and possible esophagitis per EGD per Dr. Virginia Rochester in     May of 2003.   MEDICATIONS:  1. Aspirin 325 mg daily.  2. Lopressor 100 mg b.i.d.  3. Avandia 4 mg daily.  4. Accupril 20 mg daily.  5. Pravachol 80 mg daily.  6. Clarinex 5 mg daily.  7. Micardis/hydrochlorothiazide 80/12.5 mg two tablets daily.  8. Catapres 0.2 mg t.i.d.  9. Plavix 75 mg daily.  10.      Foltx one daily.  11.      Tylenol p.r.n.  12.      Fish oil capsules one to two daily.  13.      Darvocet-N 100 half a tablet every six hours as needed for pain.   ALLERGIES:  PENICILLIN.   SOCIAL HISTORY:  The patient is married and lives in [place], [place].  He has three children.  He stopped smoking three years ago.  He  denies alcohol use.  He is retired and disabled from [employer].  He  ambulates unassisted.  He needs assistance with dressing and bathing.  He  does feed himself.   FAMILY HISTORY:  The family history is positive for hypertension.  His  father died of a myocardial infarction.  He has a sister who has a history  of hypertension.   REVIEW OF SYSTEMS:  Review of systems as above in the history of present  illness.   EXAM:  VITAL SIGNS:  Temperature  98.8, blood pressure 98/60, pulse 67,  respiratory rate 18, oxygen saturation 99% on 2 L.  GENERAL:  The patient is an alert 70 year old Caucasian man who is currently  sitting up in bed in no acute distress.  He is well-nourished.  HEENT:  Head is normocephalic, nontraumatic.  I could not palpate a  hematoma.  I did not see any ecchymotic lesion on his scalp.  His scalp and  his head were nontender.  Pupils are equal, round and reactive to light.  Extraocular movements are intact.  Conjunctivae are clear.  Sclerae are  white.  Tympanic membranes were obscured by cerumen bilaterally.  Nasal  mucosa is moist, no drainage.  No sinus tenderness.  Oropharynx reveals good  dentition.  Mucous membranes are mildly dry.  No posterior exudates or  erythema.  NECK:  Neck is supple with no adenopathy, no thyromegaly, no bruit.  LUNGS:  The lungs are clear to auscultation bilaterally.  HEART:  S1, S2, with a soft systolic murmur and bradycardia.  CHEST WALL:  No tenderness to palpation of the pectoralis major muscles on  the left or right.  ABDOMEN:  Positive bowel sounds.  Soft, nontender and nondistended.  No  hepatosplenomegaly.  MUSCULOSKELETAL/EXTREMITIES:  The patient has good range of motion of his  shoulders and his hips and his knees symmetrically and bilaterally.  The  patient has good muscle tone throughout.  He is only mildly tender to  palpation of his right shoulder capsule.  No bruises seen over his shoulder  or his back.  Both feet have pedal pulses, trace to 1+ bilaterally.  He is  able to move his right foot with good range of motion.  No swelling of his  right foot.  No swelling or edema of his extremities bilaterally.  NEUROLOGIC:  The patient is alert and oriented to year, place, mouth, wife,  daughter, himself.  The patient does have some short-term memory loss  because he becomes dependent on his family answering questions about the  events of the day.  Cranial nerves II-XII  are grossly intact with the  exception of visual acuity.  The patient does have a mild intention tremor  on the right.  The strength of his hand grips bilaterally is 5/5.  His  distal extremity strength is 5/5.  His proximal lower extremity muscles were  not tested.  The gait was not assessed.   ORTHOSTATIC VITAL SIGNS:  Supine blood pressure 106/55, supine pulse 60;  standing blood pressure 99/45 and standing pulse 64.   ADMISSION LABORATORIES:  Chest x-ray:  No acute disease.   CT scan of the head reveals an old left posterior communicating artery  infarct.  No acute changes.   EKG:  Sinus bradycardia with a heart rate of 55 and no acute changes.  Mild  ST-T wave abnormalities.   Sodium 137, potassium 3.5, chloride 102, BUN 59, glucose 82.  PT 12, INR  0.8, PTT 35.  Myoglobin 201, troponin I less than 0.05.  Magnesium 3.0.  WBC 6.7, hemoglobin 14.2,  hematocrit 42.2, MCV 90.6, platelets 255,000.   ASSESSMENT:  1. Presyncope/syncope.  The patient apparently became syncopal as he stood     up in preparation to walk.  He apparently fell and hit his head and     mildly injured his right shoulder.  The chest pain came later.  The     patient denied any chest pain prior to briefly passing out.  We will     consider orthostatic hypotension as the potential cause.  Other causes     would include ischemia, pulmonary embolus and transient ischemic attack.     The patient is relatively hypotensive, given that he is normally on four     antihypertensive medications for hypertension.  He is also mildly     orthostatic, which may be the most single important cause of his syncopal     episode today.  2. Transient substernal chest pain.  The patient's chest pain was apparently     relieved with a sublingual nitroglycerin today in Dr. Soyla Murphy. Pharr's     office.  The patient is currently chest-pain-free.  In fact, the patient     does not recall having chest pain earlier in the day.  His EKG  reveals     sinus bradycardia and no acute abnormalities.  His first set of cardiac     markers in the emergency department are negative.  He appears to be     hemodynamically stable with the exception of mildly low blood pressure.     The patient will need to be admitted to rule out myocardial infarction.     We will also check a D-dimer to assess for low-probability pulmonary     embolus.  3. Right shoulder and right foot pain secondary to a fall.  The patient has     good range of motion of his right shoulder and right foot.  No bruises or     edema noted on exam.  Only minimal tenderness of his right dorsum and     right shoulder capsule.  Given the mild discomfort and good range of     motion of all joints, the patient was not imaged.  4. Status post acute left brain stroke on January 24, 2003.  The patient was     discharged from the hospital on January 30, 2003.  He does have some     transient mild weakness on the right, per family history, however, the     patient's strength in the standing position was not assessed today.  His     proximal upper and lower extremity strength was not assessed today.  His     strength appeared to be intact distally and symmetrically of the lower     and upper extremities.  The patient probably has some chronic     dysequilibrium per the family's account of his progress over the past     week or so.  5. Type 2 diabetes mellitus.  The patient's A1c was assessed earlier in the     month and was well within normal limits with a value of 6.6.  6. Chronic renal insufficiency.  The patient's baseline creatinine and BUN     are unknown, however, his BUN in October was 28 and the creatinine was     2.5.  The patient may hae some aspect of volume depletion.  He has seen     Dr. Eliott Nine in the past.   PLAN:  1. The patient will be admitted to a telemetry bed.  Cardiac enzymes will be    ordered every eight hours x3 to rule out or rule in for a myocardial      infarction.  2. We will check an EKG in the morning.  3. We will add a D-dimer to assess for PE, although this is a low     probability.  4. We will continue to check orthostatics in the a.m.  5. Empiric oxygen therapy at 1.5 L per minute.  6. Gentle volume repletion with normal saline with 20 mEq of potassium     chloride at 125 mL an hour x8 hours, then 75 mL an hour.  7. Strict I's and O's.  8. Check a basic metabolic panel in the morning as well as a CBC.  The     patient's creatinine needs to be assessed.  9. We will hold the patient's antihypertensive medications for now.  Restart     medications one or more at the time when his blood pressure rebounds     after volume repletion.  10.      We will consider a cardiology consult if needed.  The patient did     have a 2-D echocardiogram ordered a week or so ago during hospital     admission.  The 2-D echo was technically difficult and with poor quality     at the time.  We will consider obtaining another 2-D echocardiogram.  11.      OT and PT consults as tolerated tomorrow.                                                Elliot Cousin, M.D.    DF/MEDQ  D:  02/08/2003  T:  02/08/2003  Job:  [id number]   cc:   Soyla Murphy. Renne Crigler, M.D.  [address]  [place]  [place] [address]  Fax: [phone]

## 2010-08-14 NOTE — Consult Note (Signed)
NAME:  Alexander [name], Alexander [name]                          ACCOUNT NO.:  [id number]   MEDICAL RECORD NO.:  [id number]                   PATIENT TYPE:  INP   LOCATION:  3704                                 FACILITY:  [facility]   PHYSICIAN:  Pramod P. Pearlean Brownie, MD                 DATE OF BIRTH:  1941/01/08   DATE OF CONSULTATION:  DATE OF DISCHARGE:  01/30/2003                                   CONSULTATION   REFERRING PHYSICIAN:  Elliot Cousin, M.D.   REASON FOR REFERRAL:  Stroke.   HISTORY OF PRESENT ILLNESS:  Mr. Alexander [name] is a pleasant 70 year old gentleman  who is known to me from recent hospital admission for left posterior  cerebral artery territory infarction on January 24, 2003.  The patient had  been discharged home and was doing well until two days ago when he developed  sudden onset of dizziness at home when he got up from his chair.  He fell  down and hit his right side of his body and toe and complained of pain.  Subsequently when he was seen in the physician's office he complained of  some chest pain which was relieved with nitroglycerin.  He has subsequently  been hospitalized and at the time of admission he had a blood pressure of  95/55.  Since then he has been able to get up and walk with assistance to  the bathroom and complains of dizziness only intermittently.  Denied any  vertigo, any numbness, extremity weakness, slurred speech.  He was recently  hospitalized on January 24, 2003 for sudden onset of falling and loss of  vision from the right periphery.  He has poor vision at baseline from  macular degeneration and was legally blind but now following the last stroke  he had lost complete vision.  Stroke risk stratification evaluation at that  time revealed a large left complete posterior cerebral artery infarction.  MRA of the brain revealed low flow in the left internal carotid artery.  There was diffuse intracranial atherosclerotic changes in the cerebral  vessels.  The right  posterior cerebral artery had a fetal origin.  Carotid  ultrasound revealed near total occlusion of the left internal carotid  artery.  The patient was started on aspirin and Plavix and not on Coumadin  because he was considered a high risk for fall due to his blindness and  recent stroke.  The plan was to have elective catheter angiogram done in  four to six weeks' time to see if his left internal carotid artery or the  left vertebral artery could be revascularized.  The carotid ultrasound had  shown abnormal left vertebral artery wave form which had indicated possible  proximal vertebral/subclavian artery stenosis.  The stroke risk factors had  revealed hypertension, diabetes, hyperlipidemia, as well as  hyperhomocysteinemia.  He was on appropriate medical therapy for the same.   PAST MEDICAL HISTORY:  1. Macular degeneration.  2. Chronic renal failure.  3. Diverticulosis.  4. Colon polyps.  5. Gastritis.  6. Osteoarthritis.   PAST SURGICAL HISTORY:  Low back surgery.   ALLERGIES:  PENICILLIN.   SOCIAL HISTORY:  The patient is on disability.  He used to be former  Curator.  He is married, living with his wife.  He has three daughters.   FAMILY HISTORY:  Not significant for anybody with stroke.   REVIEW OF SYSTEMS:  Positive nocturia, right foot pain, right-sided  weakness, chest pain.  No shortness of breath, abdominal pain, melena, or  headache.   MEDICATIONS ON ADMISSION:  1. Aspirin.  2. Plavix.  3. Claritin.  4. Citrucel.  5. Pravachol.  6. Avandia.  7. Senna.  8. Foltx.  9. Tylenol.   MEDICATIONS ON HOLD AT THE TIME OF ADMISSION DUE TO LOW BLOOD PRESSURE:  1. Accupril.  2. Micardis.  3. Hydrochlorothiazide.  4. Fish oil.  5. Lopressor.   PHYSICAL EXAMINATION:  GENERAL:  Pleasant, elderly gentleman not in  distress.  VITAL SIGNS:  Pulse rate 70 per minute, regular, blood pressure today  117/63, saturations 96% on room air.  HEENT:  Head is nontraumatic.   ENT examination unremarkable.  NECK:  Supple.  Soft carotid bruit heard bilaterally.  CARDIAC:  No murmur or gallop.  LUNGS:  Clear to auscultation.  NEUROLOGIC:  Awake, alert, oriented x3 with normal speech and language  function.  There is no aphasia, apraxia, or dysarthria.  Pupils are both 4  mm and very sluggishly reactive.  He is completely blind in both eyes.  He  has only light perception in the left eye and not in the right.  Funduscopic  examination was not done.  Eye movements have full ranges at this time.  Face is symmetric bilaterally.  Tongue is midline.  Motor system  examination:  There is symmetric upper and lower extremity strength, tone,  reflexes except for ankles, which are absent.  Plantars are downgoing.  He  has some hyperesthesia in both feet on the soles and toes.  His gait is  broad based and ataxic and he required one person assist.  There is no  significant truncal ataxia.   DATA REVIEWED:  CAT scan of the head reveals old left PCA infarct and no  acute abnormalities are noted.  Recent hospitalization for stroke and stroke  risk stratification work-up from January 24, 2003 admission were reviewed.   IMPRESSION:  A 70 year old gentleman with recent left posterior cerebral  artery infarction with multivessel diffuse severe arteriosclerotic disease  with transient episode of dizziness upon rising likely secondary to  orthostatic hypotension of multiple factor etiology due to hypertensive  effect of several medications as well as hypovolemia and prerenal azotemia.  New acute infarction is unlikely.   PLAN:  I would not recommend any further diagnostic neurological work-up at  the present time.  Continue aspirin and Plavix for secondary stroke  prevention as well as control of his hyperlipidemia, diabetes, and  hyperhomocysteinemia.  Agree with adjusting his antihypertensive medications and aim for a systolic blood pressure of at least 110.  The patient will  not  be able to tolerate hypertension below this secondary to his diffuse  intracranial vascular stenosis.  He needs pressure help to maintain his  cerebral function.  The patient may benefit by elective catheter cerebral  angiogram in three to four weeks' time to see if he will benefit from  revascularization of the left  internal carotid artery if it is open or left  vertebral artery or subclavian artery if they have high grade stenosis.  I  discussed this with patient and his wife and multiple family members and  answered questions.  Family call for questions.                                               Pramod P. Pearlean Brownie, MD    PPS/MEDQ  D:  02/09/2003  T:  02/09/2003  Job:  [id number]   cc:   Elliot Cousin, M.D.

## 2010-08-14 NOTE — Discharge Summary (Signed)
NAME:  Alexander [name], Alexander [name]                          ACCOUNT NO.:  [id number]   MEDICAL RECORD NO.:  [id number]                   PATIENT TYPE:  INP   LOCATION:  3704                                 FACILITY:  [facility]   PHYSICIAN:  Pramod P. Pearlean Brownie, MD                 DATE OF BIRTH:  March 28, 1941   DATE OF ADMISSION:  01/24/2003  DATE OF DISCHARGE:  01/30/2003                                 DISCHARGE SUMMARY   DIAGNOSES AT THE TIME OF DISCHARGE:  1. Acute left posterior communicating artery infarction.  2. Left internal carotid artery occlusion.  3. Diffuse intracranial atherosclerotic disease.  4. Hyperhomocysteinemia.  5. Hyperlipidemia.  6. Diabetes.  7. Hypertension.  8. Myocardial infarction, history.  9. Macular degeneration, legally blind.  10.      Status post root canal week of admission.  11.      History of tobacco abuse, quit three years ago.   MEDICATIONS AT THE TIME OF DISCHARGE:  1. Aspirin 325 mg daily.  2. Lopressor 100 mg b.i.d.  3. Avandia 4 mg daily with meals.  4. Accupril 20 mg a day.  5. Pravachol 80 mg a day.  6. Clarinex 5 mg a day.  7. Micardis 80/12.5 mg, 2 daily.  8. Catapres 0.2 mg three times daily.  9. Plavix 75 mg daily.  10.      Foltx 1 daily.  11.      Tylenol p.r.n.  12.      Okay to resume fish oil.  13.      Darvocet-N 100, 1/2 q.6h. p.r.n. pain.   STUDIES PERFORMED:  1. CT of the head on admission showed a left PCA infarct.  2. MRI showed acute left PCA infarct and occipital lobe.  3. MRA of the head showed decreased flow left ICA.  Decrease in left PCA.     Fetal origin right PCA.  Diffuse intracranial atherosclerosis     bilaterally.  4. Carotid Doppler showed left ICA occlusion.  With left vertebral artery     with abnormal wave forms.  5. A 2-D echocardiogram was technically difficult with poor quality and     sensitivity.  6. Transcranial Doppler was completed.  Results are pending at the time of     discharge.  7. EKG:  Sinus  bradycardia, suspect nonspecific ST and T-wave abnormality.   LABORATORY STUDIES:  Homocystine 19.49.  Hemoglobin A1C 6.6, cholesterol  213, triglycerides 183, LDL 150, HDL 26.  Cardiac enzymes negative.  Hemoglobin 15.8, hematocrit 45.4, white blood cells 9.2, red blood cells  4.9, MCV 92, MCHC 34.8, RDW 15.8, platelets 236, neutrophils elevated at 81,  otherwise differential normal.  Sodium 136, potassium 3.1, chloride 103, CO2  26, glucose 175, BUN 32, creatinine 2.5, calcium 9.4, total protein 8.1,  albumin 3.8.  AST 23, ALT 17, ALP 64, total bilirubin 0.8.  Urine drug  screen negative.  UA with few epithelials, 3-6 red blood cells, 3-6 white  blood cells with hyaline casts and granular casts.  Blood culture negative  x3.  Urine culture 2000 colonies, growth insignificant.   HISTORY OF PRESENT ILLNESS:  Alexander [name] is a 70 year old white male  who had sudden loss of right-sided vision and inability to walk straight,  accompanied by severe headaches.  He did not seek medical care and the next  day went to his dentist for a scheduled root canal.  Several days later he  appeared in Dr. Carolee Rota office somewhat confused, dysarthric, and unable to  coordinate movements.  He walked into objects due to his visual loss on the  right.  He described frontal and temporal headaches.  Dr. Renne Crigler referred the  patient to the emergency room, where a CT scan confirmed a left PCA stroke  in a juxta distribution involving the thalamus.  The patient was admitted to  the hospital for further stroke workup.   HOSPITAL COURSE:  MRI confirmed the presence of PCA infarction felt  secondary to diffuse intracranial atherosclerosis with stenosis in his  vertebral arteries.  His left ICA was occluded.  Coumadin would have been  the treatment of choice, but it was felt the patient was at high risk for  falls and bleeding due to his previously existing macular degeneration with  legal blindness, now with a  decrease in his visual field to the right.  Choice was made to treat the patient with aspirin and Plavix for six weeks.  At that point would do a diagnostic cerebral angiogram to look for a  treatable proximal left vertebral artery stenosis and consider stenting at  that time.  Will also reevaluate the left ICA occlusion to see if anything  can be done there.   Other risk factors identified include elevated homocystine for which the  patient was started on Foltx.  The patient also was encouraged to keep  better control of his diabetes.  Initial plans were to transfer him to  rehabilitation.  As he was able to walk 250 feet without assistance, then  the decision was made to send the patient home initially with home health,  PT, OT, and speech with probable conversion to outpatient.   The patient also had sinusitis present on CT scan with elevated neutrophils  and upper respiratory symptoms for approximately a month per wife.  He was  placed on Zithromax and treated with a five-day course.  He was treated here  with Claritin and will continue on Clarinex at home as prior to admission.   CONDITION AT DISCHARGE:  Patient alert and oriented x3.  Right and left  confusion is resolving.  He is able to name and repeat.  Able to follow  commands.  He is blind bilaterally, with only light perception on the left.  He moves all extremities well.  He has no focal weakness.   DISCHARGE PLAN:  1. Discharge home to care of wife.  She will take _________ to care for him     initially.  2. Home health PT, OT, and speech therapy with [facility].  3. Aspirin and Plavix for secondary prevention.  4.     Follow up with Dr. Pearlean Brownie in two months.  Consider angiogram in six weeks to      evaluate vertebral artery stenosis and questionable stenting of that as     well as occluded left ICA to see if that can be stented. 5.  Follow up with Dr. Renne Crigler in one month.      Annie Main, N.P.                          Pramod P. Pearlean Brownie, MD    SB/MEDQ  D:  01/30/2003  T:  01/31/2003  Job:  [id number]   cc:   Soyla Murphy. Renne Crigler, M.D.  [address]  [place]  [place] [address]  Fax: [phone]   Dr. Antoine Poche  phone #[phone]

## 2010-08-14 NOTE — Consult Note (Signed)
NAME:  Alexander [name], Alexander [name]                          ACCOUNT NO.:  [id number]   MEDICAL RECORD NO.:  [id number]                   PATIENT TYPE:  INP   LOCATION:  2024                                 FACILITY:  [facility]   PHYSICIAN:  Maree Krabbe, M.D.             DATE OF BIRTH:  1940-04-04   DATE OF CONSULTATION:  02/08/2003  DATE OF DISCHARGE:                                   CONSULTATION   REFERRING PHYSICIAN:  Norwood Levo, MD   REASON FOR CONSULTATION:  Acute on chronic renal insufficiency.   BRIEF HISTORY:  Mr. Mochizuki is a 70 year old man with recent acute stroke on  January 24, 2003.  He also has diabetes, chronic renal insufficiency,  blindness secondary to macular degeneration.  He was admitted by the  medicine hospitalists service for an episode of syncope or presyncope on  February 07, 2003.  The history the patient is giving is consistent with an  orthostatic hypotensive episode, and he also had persistent hypotension  since admission.  He was discharged on January 30, 2003, and he was placed  on four antihypertensive medications.  For the past week, he has not been  eating good, and his fluid intake was also poor.  The patient denies any  nausea, vomiting, diarrhea, polyuria.  The family confirms this.   PAST MEDICAL HISTORY:  1. Left communicating artery infarction with right-sided weakness in October     2004.  2. Left internal carotid occlusion.  3. Hypertension.  4. Diabetes mellitus.  5. Hyperlipidemia.  6. Hypohomocystinemia.  7. Macular degeneration.  8. Chronic renal insufficiency with baseline creatinine of 2.2.  9. Status post diskectomy.  10.      Diverticulosis.  11.      Colon polyps.  12.      Gastritis.  13.      Esophagitis.  14.      Osteoarthritis.   ALLERGIES:  PENICILLIN.   SOCIAL HISTORY:  He is on disability, former Curator.  He is married and  has three daughters.   FAMILY HISTORY:  Father passed away from an MI.  Mom passed  away with  cancer.  He has four siblings who are healthy short of some hypertension and  diabetes in a few of them.   REVIEW OF SYSTEMS:  Positive for nocturia.  Positive for some right-sided  weakness and some right foot pain.  Negative for abdominal pain, negative  for melena, hematochezia, or headache.   MEDICATIONS:  1. Aspirin 325 mg once a day.  2. Plavix 75 mg once a day.  3. Claritin 10 mg once a day.  4. Citracal twice a day.  5. Pravachol 80 once a day.  6. Avandia 4 once a day.  7. Senokot 2 tablets once a day.  8. B12 once a day.  9. Folic acid once a day.  10.      B6 once  a day.  11.      Tylenol as needed.  12.      Darvocet as needed.  13.      Before admission, he was also on Accupril, Micardis,     hydrochlorothiazide, and Lopressor.   PHYSICAL EXAMINATION:  VITAL SIGNS: Temperature 98.1, blood pressure 110/60,  heart rate 64, respirations 20, saturation 97% on room air.  Urine output  1300 ml.  GENERAL:  He is in no acute distress, sitting in chair.  HEENT:  Pupils equal, round, and reactive to light and accommodation.  Extraocular muscles intact.  Normocephalic and atraumatic.  NECK:  Supple. No JVD and no carotid bruits.  LUNGS:  Clear to auscultation without wheezing, rhonchi, or crackles.  HEART:  Regular rate and rhythm with prominent S1.  No murmurs, rubs, or  gallops.  ABDOMEN:  Soft, nontender, nondistended.  Bowel sounds present.  EXTREMITIES:  No edema and good pulses.  NEUROLOGIC:  Mild decrease in strength on the right side.  He has aphasia,  confusion.  SKIN:  Redness on both feet.  Left forearm has scratch marks, is dry, and  has positive turgor.   LABORATORY DATA:  Phosphorus 2.8, magnesium 2.9.  D-dimer less than 0.22.  Three sets of CK, CK-MB, and troponin normal.  Sodium 140, potassium 3.8,  chloride 103, bicarbonate 31, BUN 52, creatinine 3.4, glucose 116, calcium  10.  Leukocytes 6, hemoglobin 13.5, hematocrit 39.6, and platelets  219.   A 2-D echocardiogram of heart showed 65% ejection fraction and some left  ventricular hypertrophy.   IMPRESSION AND PLAN:  1. Acute on chronic renal insufficiency most likely related to     hyperperfusion secondary to persistent hypotension and ACE inhibitor use.     At this point, we agree with holding all the antihypertensives and     following him closely.  We also agree with gentle hydration.  Check urine     sodium, urine creatinine, and urinalysis to exclude acute tubular     necrosis.  If has persistent renal insufficiency, will check a renal     ultrasound, although at this point, he does not act like he has acute     obstruction.  2. His hypermagnesemia is most likely related to this renal insufficiency     and probably some magnesia-containing     chronic use at home.  3. Diabetes under good control here in the hospital and agree with all the     other things that he is on.   Will follow along with you.     Lonia Blood, M.D.                          Maree Krabbe, M.D.    SL/MEDQ  D:  02/08/2003  T:  02/09/2003  Job:  [id number]   cc:   Norwood Levo, MD  Fax: [phone]

## 2010-08-14 NOTE — Op Note (Signed)
NAME:  Alexander [name], Alexander [name]                          ACCOUNT NO.:  [id number]   MEDICAL RECORD NO.:  [id number]                   PATIENT TYPE:  AMB   LOCATION:  ENDO                                 FACILITY:  [facility]   PHYSICIAN:  Georgiana Spinner, M.D.                 DATE OF BIRTH:  [birth year]   DATE OF PROCEDURE:  06/12/2003  DATE OF DISCHARGE:                                 OPERATIVE REPORT   PROCEDURE:  Colonoscopy.   INDICATIONS FOR PROCEDURE:  Colon polyps resected approximately two years  ago.   ANESTHESIA:  Demerol 10, Versed 1 mg.   DESCRIPTION OF PROCEDURE:  With the patient mildly sedated in the left  lateral decubitus position, the Olympus videoscopic colonoscope was inserted  in the rectum and passed under direct vision to the cecum identified by the  ileocecal valve and base of the cecum both of which were photographed.  From  this point, the colonoscope was slowly withdrawn taking circumferential  views of the colonic mucosa stopping at where I felt was probably the  splenic flexure area at which point we could see a polyp; however, I could  photograph it but I could not really fully remove it because of positioning  at the fold.  I was able to biopsy it with hot biopsy forceps and try to  eradicate the top of the polyp with hot biopsy forceps but I am not sure we  were able to remove all the polypoid tissue therefore I placed a clip in  this area to localize it by x-ray. The endoscope was then further withdrawn  taking circumferential views of the remaining colonic mucosa stopping only  then in the rectum which appeared normal in direct and showed hemorrhoids on  retroflexed view. The endoscope was straightened and withdrawn. The  patient's vital signs and pulse oximeter remained stable.  The patient  tolerated the procedure well without apparent complications.   FINDINGS:  Moderate size polyp at what I thought was probably the splenic  flexure or a turn in the colon  marked with a clip for localization, some  moderate diverticulosis of sigmoid colon, internal hemorrhoids.   PLAN:  Await biopsy report and give consideration for surgical removal or  possibly reexplore endoscopic removal depending on results of biopsy.                                               Georgiana Spinner, M.D.    GMO/MEDQ  D:  06/12/2003  T:  06/12/2003  Job:  [id number]

## 2010-08-14 NOTE — H&P (Signed)
NAME:  Alexander [name], Alexander [name]                          ACCOUNT NO.:  [id number]   MEDICAL RECORD NO.:  [id number]                   PATIENT TYPE:  EMS   LOCATION:  MAJO                                 FACILITY:  [facility]   PHYSICIAN:  Melvyn Novas, M.D.               DATE OF BIRTH:  1940-06-03   DATE OF ADMISSION:  01/24/2003  DATE OF DISCHARGE:                                HISTORY & PHYSICAL   HISTORY OF PRESENT ILLNESS:  This gentleman is presenting to the ER with  five days of increasing malaise.  Apparently, he is a 70 year old Caucasian  gentleman who suffered from a respiratory infection for the last three  weeks, protracted, and felt congested for several weeks.  Then on Monday,  had a sudden loss of right-sided vision, inability to walk straight, and  accompanied by severe headaches.  He did not seek medical care.  He did go  to his dentist the next morning to keep a scheduled appointment for a root  canal.  He appeared in Dr. Carolee Rota office, appeared confusion, somewhat  dysarthric, and unable to coordinate, and apparently walked into objects due  to visual field loss on the right.  He also described a severe frontal and  temporal headache on the left.  Dr. Renne Crigler referred the patient to the ER  where a CT confirmed a left PCA stroke in a textbook distribution with  involvement of the perforated into the thalamus.   REVIEW OF SYSTEMS:  Visual field cut was basically some acquired injury due  to the visual deficit.  In addition, congestion, headaches, shortness of  breath.   PAST MEDICAL HISTORY:  1. Diabetes.  2. Hypercholesterolemia.  3. Macular degeneration, severe.  4. Status post laser surgery and retinal buccal implant, and multiple     corneal surgeries by Dr. Grier Rocher at [facility].  5. Hypertension.  6. Renal insufficiency.   FAMILY HISTORY:  Positive for hypertension.  Father died of a MI.  Sister  has hypertension.   SOCIAL HISTORY:  Married, children,  retired, unemployed for a prolonged  period of time prior to retirement.   MEDICATIONS:  1. Metoprolol 100 mg q.d. or b.i.d., not known at this time.  2. Allegra 180 mg.  3. Avandia 4 mg.  4. Micardis 80/12.5 once a day.  5. Pulmicort 80 mg at night.  6. Accupril 200 mg.  7. Clonidine 0.2 mg.   ALLERGIES:  PENICILLIN.   PHYSICAL EXAMINATION:  The patient has a heart rate of 75, blood pressure of  141/78, temperature of 97, 99% oxygenation on 2 L nasal air flow.  He has  bronchial rales, appears congested, and in distress.  He has a cardiac mild  rubbing murmur, no carotid bruit, no clubbing, no cyanosis.  Cranial nerves:  Left pupil 4 mm, right pupil 3 mm, both disks appear round, but appear  sluggish reactive to light.  Due to laser surgery, the retina appears scared  and I cannot appreciate a clear picture.  He has complete visual field loss  to bilateral simultaneous stimulation to the right side in a homonomous  fashion, and has also central vision loss for a prolonged period of time due  to macular degeneration.  Tongue and uvula move midline.  Facial symmetry is  intact.  Facial strength is intact bilaterally.  The patient complains of  left frontotemporal headache, but no sensory loss over the skin.  Motor exam  shows good cord strength with normal tone and equal DTRs.  He does have  apraxia and confuses left and right.  He has significant dysmetria when  asked to touch his nose, and a finger-nose test with the right.  There seems  to be a hemineglect on the right to visual and tactile responses.  The  patient had an upgoing Babinski response on the left, not on the right.   ASSESSMENT:  Left PCA ischemic infarction by history, most likely occurring  last Monday, which dates it over 72 hours back with involvement of  perforation into the thalamus.  Multiple risk factors, hypertension,  hypercholesterolemia, diabetes mellitus, renal failure, and smoking in the  past.   Complication in his symptoms is that the patient already has macular  degeneration and now has lost even more vision.  He is also appearing  confused, dysthymic; he has trouble to find names, and appears erratic in  his behavior and very frustrated.  He insisted that Dr. Renne Crigler, did for  example, his eye surgery, and that Dr. Carolynne Edouard never did eye surgery, etc.  He  appears somewhat restless.   PLAN:  Due to the patient's history of some metal implant I do not feel  comfortable ordering an MRI and MRA until we have confirmation from Dr. Carolynne Edouard  that this is an inert substance.  Due to his renal history, it is probably  not advisable to put him through a CT with contrast.  I will order all other  tests by stroke protocol.  I continued his medications.  He will be admitted  to telemetry, 2000, Dr. Pearlean Brownie.                                                Melvyn Novas, M.D.    CD/MEDQ  D:  01/24/2003  T:  01/24/2003  Job:  [id number]

## 2010-08-14 NOTE — Op Note (Signed)
NAME:  Alexander [name], Alexander [name]                          ACCOUNT NO.:  [id number]   MEDICAL RECORD NO.:  [id number]                   PATIENT TYPE:  AMB   LOCATION:  ENDO                                 [facility]AC[facility]   PHYSICIAN:  Georgiana Spinner, M.D.                 DATE OF BIRTH:  Nov 10, 1940   DATE OF PROCEDURE:  06/12/2003  DATE OF DISCHARGE:                                 OPERATIVE REPORT   PROCEDURE:  Upper endoscopy.   INDICATIONS FOR PROCEDURE:  Abdominal pain.   ANESTHESIA:  Demerol 60, Versed 6 mg.   DESCRIPTION OF PROCEDURE:  With the patient mildly sedated in the left  lateral decubitus position, the Olympus videoscopic endoscope was inserted  in the mouth and passed under direct vision through the esophagus which  appeared normal except possibly some changes of Barrett's or reflux changes  distally which were photographed and biopsied.  We entered into the stomach.  The fundus, body, antrum, duodenal bulb and second portion of the duodenum  were visualized. From this point, the endoscope was slowly withdrawn taking  circumferential views of the duodenal mucosa until the endoscope was then  pulled back in the stomach, placed in retroflexion to view the stomach from  below. The endoscope was then straightened and withdrawn taking  circumferential views of the remaining gastric and esophageal mucosa  stopping in the fundus of the stomach where there were changes of what  appeared to be passive gastropathy or just gastritis. These were  photographed and biopsied.  It was fairly extensive in the fundus.  The  endoscope was then withdrawn taking circumferential views of the remaining  gastric and esophageal mucosa.  The patient's vital signs and pulse oximeter  remained stable. The patient tolerated the procedure well without apparent  complications.   FINDINGS:  Changes described above in the fundus of the stomach.  Await  biopsy report. The patient has had a CT scan for this  abdominal pain which  showed negative abdominal and pelvic findings.   PLAN:  As above.  Await biopsy and proceed to colonoscopy as planned.                                               Georgiana Spinner, M.D.    GMO/MEDQ  D:  06/12/2003  T:  06/12/2003  Job:  [id number]

## 2010-12-07 ENCOUNTER — Other Ambulatory Visit (HOSPITAL_COMMUNITY): Payer: Self-pay | Admitting: Nephrology

## 2010-12-07 DIAGNOSIS — N186 End stage renal disease: Secondary | ICD-10-CM

## 2010-12-15 ENCOUNTER — Other Ambulatory Visit (HOSPITAL_COMMUNITY): Payer: BC Managed Care – PPO

## 2010-12-23 ENCOUNTER — Encounter: Payer: Self-pay | Admitting: Gastroenterology

## 2010-12-24 ENCOUNTER — Ambulatory Visit (HOSPITAL_COMMUNITY)
Admission: RE | Admit: 2010-12-24 | Discharge: 2010-12-24 | Disposition: A | Discharge status: Home / Self Care | Payer: Medicare Other | Source: Ambulatory Visit | Attending: Nephrology | Admitting: Nephrology

## 2010-12-24 DIAGNOSIS — T82898A Other specified complication of vascular prosthetic devices, implants and grafts, initial encounter: Secondary | ICD-10-CM | POA: Insufficient documentation

## 2010-12-24 DIAGNOSIS — I120 Hypertensive chronic kidney disease with stage 5 chronic kidney disease or end stage renal disease: Secondary | ICD-10-CM | POA: Insufficient documentation

## 2010-12-24 DIAGNOSIS — Z992 Dependence on renal dialysis: Secondary | ICD-10-CM

## 2010-12-24 DIAGNOSIS — E669 Obesity, unspecified: Secondary | ICD-10-CM | POA: Insufficient documentation

## 2010-12-24 DIAGNOSIS — D649 Anemia, unspecified: Secondary | ICD-10-CM | POA: Insufficient documentation

## 2010-12-24 DIAGNOSIS — N2581 Secondary hyperparathyroidism of renal origin: Secondary | ICD-10-CM | POA: Insufficient documentation

## 2010-12-24 DIAGNOSIS — E785 Hyperlipidemia, unspecified: Secondary | ICD-10-CM | POA: Insufficient documentation

## 2010-12-24 DIAGNOSIS — Y832 Surgical operation with anastomosis, bypass or graft as the cause of abnormal reaction of the patient, or of later complication, without mention of misadventure at the time of the procedure: Secondary | ICD-10-CM | POA: Insufficient documentation

## 2010-12-24 DIAGNOSIS — N186 End stage renal disease: Secondary | ICD-10-CM | POA: Insufficient documentation

## 2010-12-24 LAB — POCT I-STAT 4, (NA,K, GLUC, HGB,HCT)
Glucose, Bld: 122 — ABNORMAL HIGH
HCT: 41
Hemoglobin: 13.9
Operator id: 181601
Potassium: 3.8
Sodium: 140

## 2010-12-24 MED ORDER — IOHEXOL 300 MG/ML  SOLN
100.0000 mL | Freq: Once | INTRAMUSCULAR | Status: AC | PRN
  Administered 2010-12-24: 30 mL via INTRAVENOUS

## 2010-12-31 LAB — BASIC METABOLIC PANEL
BUN: 37 mg/dL — ABNORMAL HIGH (ref 6–23)
Calcium: 9.6 mg/dL (ref 8.4–10.5)
Creatinine, Ser: 3.39 mg/dL — ABNORMAL HIGH (ref 0.4–1.5)
GFR calc Af Amer: 22 mL/min — ABNORMAL LOW (ref >60)

## 2010-12-31 LAB — D-DIMER, QUANTITATIVE: D-Dimer, Quant: 0.24 ug/mL-FEU (ref 0.00–0.48)

## 2011-01-21 ENCOUNTER — Ambulatory Visit (AMBULATORY_SURGERY_CENTER): Payer: Medicare Other | Admitting: *Deleted

## 2011-01-21 ENCOUNTER — Telehealth: Payer: Self-pay | Admitting: *Deleted

## 2011-01-21 VITALS — Ht 70.5 in | Wt 186.0 lb

## 2011-01-21 DIAGNOSIS — Z1211 Encounter for screening for malignant neoplasm of colon: Secondary | ICD-10-CM

## 2011-01-21 MED ORDER — PEG-KCL-NACL-NASULF-NA ASC-C 100 G PO SOLR: ORAL | Status: DC

## 2011-01-21 NOTE — Telephone Encounter (Signed)
Dr. Russella Dar- pt in [facility] today for recall colonoscopy.  He had previous procedures with Dr. Virginia Rochester.  He has hx adenomatous polyps 2003, 2005.  Hyperplastic polyps 2007.  Also has had numerous EGD procedures with Dr. Virginia Rochester for GERD/Barrett's.  I am putting his records from Dr. Virginia Rochester on your desk for review.  Pt had ESRD and is on hemodialysis.  I have scheduled him with propofol.  [name]

## 2011-01-21 NOTE — Progress Notes (Signed)
Pt is previous pt of Dr. Virginia Rochester.  Colonoscopy in 2003 adenomatous polyps, 2005 adenomatous polyps, 2007 hyperplastic polyps.  EGD 2006 Barrett's Esophagus.  Last EGD 2010. Here for [facility] today for colonoscopy scheduled 02/16/2011 w/ Dr. Russella Dar.  Will give Dr. Russella Dar reports from Dr. Virginia Rochester to review.  [name]'

## 2011-01-22 NOTE — Telephone Encounter (Signed)
Reviewed records. OK for colonoscopy now. Recall EGD for 08/2013, for Barretts

## 2011-01-22 NOTE — Telephone Encounter (Signed)
Recall for EGD entered in computer for 09/20/2013 [name]

## 2011-01-28 ENCOUNTER — Emergency Department (HOSPITAL_COMMUNITY): Payer: Medicare Other

## 2011-01-28 ENCOUNTER — Encounter (HOSPITAL_COMMUNITY): Payer: Self-pay | Admitting: Radiology

## 2011-01-28 ENCOUNTER — Emergency Department (HOSPITAL_COMMUNITY)
Admission: EM | Admit: 2011-01-28 | Discharge: 2011-01-29 | Disposition: A | Discharge status: Home / Self Care | Payer: Medicare Other | Attending: Emergency Medicine | Admitting: Emergency Medicine

## 2011-01-28 DIAGNOSIS — E119 Type 2 diabetes mellitus without complications: Secondary | ICD-10-CM | POA: Insufficient documentation

## 2011-01-28 DIAGNOSIS — R0789 Other chest pain: Secondary | ICD-10-CM | POA: Insufficient documentation

## 2011-01-28 DIAGNOSIS — H3530 Unspecified macular degeneration: Secondary | ICD-10-CM | POA: Insufficient documentation

## 2011-01-28 DIAGNOSIS — Z992 Dependence on renal dialysis: Secondary | ICD-10-CM | POA: Insufficient documentation

## 2011-01-28 DIAGNOSIS — Z8679 Personal history of other diseases of the circulatory system: Secondary | ICD-10-CM | POA: Insufficient documentation

## 2011-01-28 DIAGNOSIS — F0390 Unspecified dementia without behavioral disturbance: Secondary | ICD-10-CM | POA: Insufficient documentation

## 2011-01-28 DIAGNOSIS — N186 End stage renal disease: Secondary | ICD-10-CM | POA: Insufficient documentation

## 2011-01-28 DIAGNOSIS — R1013 Epigastric pain: Secondary | ICD-10-CM | POA: Insufficient documentation

## 2011-01-28 DIAGNOSIS — I120 Hypertensive chronic kidney disease with stage 5 chronic kidney disease or end stage renal disease: Secondary | ICD-10-CM | POA: Insufficient documentation

## 2011-01-28 DIAGNOSIS — K859 Acute pancreatitis without necrosis or infection, unspecified: Secondary | ICD-10-CM | POA: Insufficient documentation

## 2011-01-28 DIAGNOSIS — I4460 Unspecified fascicular block: Secondary | ICD-10-CM | POA: Insufficient documentation

## 2011-01-28 DIAGNOSIS — R112 Nausea with vomiting, unspecified: Secondary | ICD-10-CM | POA: Insufficient documentation

## 2011-01-28 LAB — CBC
HCT: 36.8 % — ABNORMAL LOW (ref 39.0–52.0)
Hemoglobin: 11.9 g/dL — ABNORMAL LOW (ref 13.0–17.0)
MCH: 32.8 pg (ref 26.0–34.0)
MCHC: 32.3 g/dL (ref 30.0–36.0)
MCV: 101.4 fL — ABNORMAL HIGH (ref 78.0–100.0)
Platelets: 206 10*3/uL (ref 150–400)
RBC: 3.63 MIL/uL — ABNORMAL LOW (ref 4.22–5.81)
RDW: 16.1 % — ABNORMAL HIGH (ref 11.5–15.5)
WBC: 7.3 10*3/uL (ref 4.0–10.5)

## 2011-01-28 LAB — COMPREHENSIVE METABOLIC PANEL
ALT: 32 U/L (ref 0–53)
AST: 28 U/L (ref 0–37)
CO2: 25 mEq/L (ref 19–32)
Calcium: 10.8 mg/dL — ABNORMAL HIGH (ref 8.4–10.5)
Creatinine, Ser: 4.66 mg/dL — ABNORMAL HIGH (ref 0.50–1.35)
GFR calc Af Amer: 13 mL/min — ABNORMAL LOW (ref >90)
GFR calc non Af Amer: 12 mL/min — ABNORMAL LOW (ref >90)
Glucose, Bld: 128 mg/dL — ABNORMAL HIGH (ref 70–99)
Sodium: 140 mEq/L (ref 135–145)
Total Protein: 7.1 g/dL (ref 6.0–8.3)

## 2011-01-28 LAB — COMPREHENSIVE METABOLIC PANEL WITH GFR
Albumin: 3.8 g/dL (ref 3.5–5.2)
Alkaline Phosphatase: 59 U/L (ref 39–117)
BUN: 31 mg/dL — ABNORMAL HIGH (ref 6–23)
Chloride: 100 meq/L (ref 96–112)
Potassium: 3.4 meq/L — ABNORMAL LOW (ref 3.5–5.1)
Total Bilirubin: 0.5 mg/dL (ref 0.3–1.2)

## 2011-01-28 LAB — LIPASE, BLOOD: Lipase: 264 U/L — ABNORMAL HIGH (ref 11–59)

## 2011-01-28 LAB — POCT I-STAT TROPONIN I: Troponin i, poc: 0.05 ng/mL (ref 0.00–0.08)

## 2011-02-03 ENCOUNTER — Encounter: Payer: Self-pay | Admitting: Gastroenterology

## 2011-02-03 ENCOUNTER — Other Ambulatory Visit: Payer: Self-pay | Admitting: Gastroenterology

## 2011-02-03 ENCOUNTER — Ambulatory Visit (INDEPENDENT_AMBULATORY_CARE_PROVIDER_SITE_OTHER): Payer: Medicare Other | Admitting: Gastroenterology

## 2011-02-03 VITALS — BP 160/82 | HR 85 | Ht 70.0 in | Wt 186.0 lb

## 2011-02-03 DIAGNOSIS — R1011 Right upper quadrant pain: Secondary | ICD-10-CM

## 2011-02-03 DIAGNOSIS — K219 Gastro-esophageal reflux disease without esophagitis: Secondary | ICD-10-CM | POA: Insufficient documentation

## 2011-02-03 DIAGNOSIS — D649 Anemia, unspecified: Secondary | ICD-10-CM

## 2011-02-03 DIAGNOSIS — Z86010 Personal history of colonic polyps: Secondary | ICD-10-CM

## 2011-02-03 NOTE — Patient Instructions (Signed)
Keep your Colonoscopy appointment on November 20th, 2012.  Please call if you have any questions regarding your preparation for your procedure.  cc: Lauree Chandler, MD

## 2011-02-03 NOTE — Progress Notes (Signed)
History of Present Illness: This is a 70 year old male here today with his wife. He is a former patient of Dr. Greggory Stallion Ward. He has a history of Barrett's esophagus diagnosed in 2005 and history of adenomatous colon polyps initially diagnosed in 2005. He relates a history of chronic right upper quadrant pain is there constantly for 7 years. He occasionally has nausea and vomiting when he overeats. He recently underwent a CT scan of the abdomen and pelvis which was unremarkable. Recent blood work showed a macrocytic anemia with a hemoglobin of 11.9. Denies weight loss,  constipation, diarrhea, change in stool caliber, melena, hematochezia, dysphagia, chest pain.  Past Medical History  Diagnosis Date  . End stage renal disease     hemodialysis 3 times a week  . Seasonal allergies   . Hyperlipidemia   . Anemia   . Depression   . Diabetes mellitus     controlled by diet  . Macular degeneration     both eyes  . GERD (gastroesophageal reflux disease)   . Hypertension   . CVA (cerebral infarction)     2004  . Peptic ulcer     bleeding, 1969  . Kidney stones   . Renal insufficiency   . Diverticulosis   . Tubular adenoma of colon 07/2001  . Barrett's esophagus 05/2003  . Stroke 2004  . Colon polyps    Past Surgical History  Procedure Date  . Av fistula placement 2009  . Laminectomy 1969  . Tonsillectomy 1964  . Corneal transplant 1999    right eye  . Cystoscopy     kidney stones  . Back surgery     reports that he quit smoking about 13 years ago. He has never used smokeless tobacco. He reports that he does not drink alcohol or use illicit drugs. family history includes Hypertension in his father and Stomach cancer in his mother.  There is no history of Colon cancer. Allergies  Allergen Reactions  . Penicillins Swelling   Outpatient Encounter Prescriptions as of 02/03/2011  Medication Sig Dispense Refill  . amiodarone (PACERONE) 200 MG tablet Take 200 mg by mouth daily.       [name]  aspirin 81 MG tablet Take 81 mg by mouth daily.        [name] b complex-vitamin c-folic acid (NEPHRO-VITE) 0.8 MG TABS Take 0.8 mg by mouth at bedtime.        . bromocriptine (PARLODEL) 5 MG capsule Take 5 mg by mouth daily.       . calcium acetate, Phos Binder, (PHOSLYRA) 667 MG/5ML SOLN Take by mouth 3 (three) times daily with meals.        [name] FLUoxetine (PROZAC) 20 MG capsule Take by mouth 2 (two) times daily.       [name] levocetirizine (XYZAL) 5 MG tablet Take 5 mg by mouth every evening.       [name] omeprazole (PRILOSEC) 20 MG capsule Take 20 mg by mouth daily.        [name] ZETIA 10 MG tablet Take 10 mg by mouth daily.       [name] DISCONTD: atorvastatin (LIPITOR) 40 MG tablet Take 40 mg by mouth daily. Takes 1/2 tablet daily      . DISCONTD: peg 3350 powder (MOVIPREP) 100 G SOLR moviprep as directed  1 kit  0      Review of Systems: Pertinent positive and negative review of systems were noted in the above HPI section. All other review of systems were otherwise negative.  Physical Exam: General: Well developed , well nourished, no acute distress Head: Normocephalic and atraumatic Eyes:  sclerae anicteric, EOMI Ears: Normal auditory acuity Mouth: No deformity or lesions Neck: Supple, no masses or thyromegaly Lungs: Clear throughout to auscultation Heart: Regular rate and rhythm; no murmurs, rubs or bruits Abdomen: Soft, non tender and non distended. No masses, hepatosplenomegaly or hernias noted. Normal Bowel sounds Rectal: Deferred to colonoscopy, recent exam by Dr. Renne Crigler was unremarkable with Hemoccult negative stool Musculoskeletal: Symmetrical with no gross deformities  Skin: No lesions on visible extremities Pulses:  Normal pulses noted Extremities: No clubbing, cyanosis, edema or deformities noted Neurological: Alert oriented x 4, grossly nonfocal Cervical Nodes:  No significant cervical adenopathy Inguinal Nodes: No significant inguinal adenopathy Psychological:  Alert and cooperative. Normal  mood and affect  Assessment and Recommendations:  1. Chronic right upper quadrant pain. This appears to be abdominal wall pain. I suspect this is musculoskeletal or neuropathic. Schedule an abdominal ultrasound although his symptoms are not typical for biliary disorders. Consider a CCK HIDA.  2. Barrett's esophagus and GERD. Barrett's was not noted on his last 2 endoscopies. Continue daily PPI and standard antireflux measures. Surveillance endoscopy in 5 years.  3. Personal history of adenomatous colon polyps. He is due for surveillance colonoscopy. The risks, benefits, and alternatives to colonoscopy with possible biopsy and possible polypectomy were discussed with the patient and they consent to proceed.   4. Macrocytic anemia. Further evaluation with Dr. Renne Crigler.  5. End-stage renal disease on hemodialysis.

## 2011-02-04 ENCOUNTER — Other Ambulatory Visit: Payer: BC Managed Care – PPO | Admitting: Gastroenterology

## 2011-02-05 ENCOUNTER — Ambulatory Visit
Admission: RE | Admit: 2011-02-05 | Discharge: 2011-02-05 | Disposition: A | Discharge status: Home / Self Care | Payer: Medicare Other | Source: Ambulatory Visit | Attending: Gastroenterology | Admitting: Gastroenterology

## 2011-02-05 DIAGNOSIS — R1011 Right upper quadrant pain: Secondary | ICD-10-CM

## 2011-02-09 ENCOUNTER — Other Ambulatory Visit: Payer: Self-pay

## 2011-02-09 DIAGNOSIS — R1013 Epigastric pain: Secondary | ICD-10-CM

## 2011-02-15 ENCOUNTER — Telehealth: Payer: Self-pay | Admitting: Gastroenterology

## 2011-02-15 NOTE — Telephone Encounter (Signed)
Wife says pt. Ate full breakfast this am, bacon, eggs toast , coffee,wanting to know if Dr. Eulah Citizen still do procedure. Notified Dr. Russella Dar per Christie Nottingham. Called pt's wife back & told them Dr. Russella Dar said procedure would still be done that pt should remain on clear liquids only & do prep as instructed.

## 2011-02-16 ENCOUNTER — Telehealth: Payer: Self-pay | Admitting: Internal Medicine

## 2011-02-16 ENCOUNTER — Ambulatory Visit (AMBULATORY_SURGERY_CENTER): Payer: Medicare Other | Admitting: Gastroenterology

## 2011-02-16 ENCOUNTER — Encounter: Payer: Self-pay | Admitting: Gastroenterology

## 2011-02-16 ENCOUNTER — Other Ambulatory Visit: Payer: Self-pay | Admitting: Gastroenterology

## 2011-02-16 DIAGNOSIS — Z86010 Personal history of colonic polyps: Secondary | ICD-10-CM

## 2011-02-16 DIAGNOSIS — D126 Benign neoplasm of colon, unspecified: Secondary | ICD-10-CM

## 2011-02-16 DIAGNOSIS — R109 Unspecified abdominal pain: Secondary | ICD-10-CM

## 2011-02-16 DIAGNOSIS — R1011 Right upper quadrant pain: Secondary | ICD-10-CM

## 2011-02-16 DIAGNOSIS — Z1211 Encounter for screening for malignant neoplasm of colon: Secondary | ICD-10-CM

## 2011-02-16 DIAGNOSIS — D649 Anemia, unspecified: Secondary | ICD-10-CM

## 2011-02-16 LAB — GLUCOSE, CAPILLARY
Glucose-Capillary: 73 mg/dL (ref 70–99)
Glucose-Capillary: 87 mg/dL (ref 70–99)

## 2011-02-16 MED ORDER — SODIUM CHLORIDE 0.9 % IV SOLN: 500.0000 mL | INTRAVENOUS | Status: DC

## 2011-02-16 MED ORDER — DEXTROSE 5 % IV SOLN: INTRAVENOUS | Status: DC

## 2011-02-16 NOTE — Telephone Encounter (Signed)
Alexander [name] [id number] [birth year]   The patient's wife called. The husband was also on the line at times. It was in the background. He had a temperature to 101.5. This was hours after his colonoscopy. He had taken a nap and had eaten food without difficulty. There are no other symptoms. His IV site looks fine according to them. Specifically no abnormal pain, nausea or vomiting, diarrhea or bleeding reported.  I advised them to monitor things. I thought it was okay to take acetaminophen. If he has recurrent fever, or develops other problems I advised call back and or go to the emergency department.

## 2011-02-16 NOTE — Progress Notes (Signed)
Moderate amount of sputum and secretions with procedure.

## 2011-02-16 NOTE — Patient Instructions (Signed)
Please refer to the blue and neon green sheets for instructions regarding diet and activity for the rest of today.  Handouts on polyps,diverticulosis and hemorrhoids given. Resume previous medications.

## 2011-02-17 ENCOUNTER — Telehealth: Payer: Self-pay

## 2011-02-17 NOTE — Telephone Encounter (Signed)
Follow up Call- Patient questions:  Do you have a fever, pain , or abdominal swelling? no Pain Score  0 *  Have you tolerated food without any problems? yes  Have you been able to return to your normal activities? yes  Do you have any questions about your discharge instructions: Diet   no Medications  no Follow up visit  no  Do you have questions or concerns about your Care? no  Actions: * If pain score is 4 or above: No action needed, pain <4. Patient ran a slight temperature. Called MD on call instructed to take tylenol. Patient doing okay this morning no temperature. Instructed to call back if needed.

## 2011-02-22 ENCOUNTER — Encounter: Payer: Self-pay | Admitting: Gastroenterology

## 2011-02-25 ENCOUNTER — Encounter: Payer: BC Managed Care – PPO | Admitting: Gastroenterology

## 2011-03-04 ENCOUNTER — Other Ambulatory Visit (HOSPITAL_COMMUNITY): Payer: Medicare Other

## 2011-03-08 ENCOUNTER — Encounter (HOSPITAL_COMMUNITY): Payer: Self-pay | Admitting: Emergency Medicine

## 2011-03-08 ENCOUNTER — Emergency Department (HOSPITAL_COMMUNITY)
Admission: EM | Admit: 2011-03-08 | Discharge: 2011-03-09 | Disposition: A | Discharge status: Home / Self Care | Payer: Medicare Other | Attending: Emergency Medicine | Admitting: Emergency Medicine

## 2011-03-08 DIAGNOSIS — R55 Syncope and collapse: Secondary | ICD-10-CM | POA: Insufficient documentation

## 2011-03-08 DIAGNOSIS — Z8673 Personal history of transient ischemic attack (TIA), and cerebral infarction without residual deficits: Secondary | ICD-10-CM | POA: Insufficient documentation

## 2011-03-08 DIAGNOSIS — R404 Transient alteration of awareness: Secondary | ICD-10-CM | POA: Insufficient documentation

## 2011-03-08 DIAGNOSIS — R5381 Other malaise: Secondary | ICD-10-CM | POA: Insufficient documentation

## 2011-03-08 DIAGNOSIS — N186 End stage renal disease: Secondary | ICD-10-CM | POA: Insufficient documentation

## 2011-03-08 DIAGNOSIS — R5383 Other fatigue: Secondary | ICD-10-CM | POA: Insufficient documentation

## 2011-03-08 DIAGNOSIS — Z79899 Other long term (current) drug therapy: Secondary | ICD-10-CM | POA: Insufficient documentation

## 2011-03-08 DIAGNOSIS — F329 Major depressive disorder, single episode, unspecified: Secondary | ICD-10-CM | POA: Insufficient documentation

## 2011-03-08 DIAGNOSIS — Z992 Dependence on renal dialysis: Secondary | ICD-10-CM | POA: Insufficient documentation

## 2011-03-08 DIAGNOSIS — K219 Gastro-esophageal reflux disease without esophagitis: Secondary | ICD-10-CM | POA: Insufficient documentation

## 2011-03-08 DIAGNOSIS — K2270 Barrett's esophagus without dysplasia: Secondary | ICD-10-CM | POA: Insufficient documentation

## 2011-03-08 DIAGNOSIS — E785 Hyperlipidemia, unspecified: Secondary | ICD-10-CM | POA: Insufficient documentation

## 2011-03-08 DIAGNOSIS — I120 Hypertensive chronic kidney disease with stage 5 chronic kidney disease or end stage renal disease: Secondary | ICD-10-CM | POA: Insufficient documentation

## 2011-03-08 DIAGNOSIS — F3289 Other specified depressive episodes: Secondary | ICD-10-CM | POA: Insufficient documentation

## 2011-03-08 DIAGNOSIS — E119 Type 2 diabetes mellitus without complications: Secondary | ICD-10-CM | POA: Insufficient documentation

## 2011-03-08 LAB — DIFFERENTIAL
Eosinophils Relative: 1 % (ref 0–5)
Lymphocytes Relative: 23 % (ref 12–46)
Lymphs Abs: 1.7 10*3/uL (ref 0.7–4.0)
Monocytes Absolute: 0.5 10*3/uL (ref 0.1–1.0)
Neutro Abs: 5 10*3/uL (ref 1.7–7.7)

## 2011-03-08 LAB — CBC
HCT: 34 % — ABNORMAL LOW (ref 39.0–52.0)
MCV: 100.3 fL — ABNORMAL HIGH (ref 78.0–100.0)
Platelets: 206 10*3/uL (ref 150–400)
RBC: 3.39 MIL/uL — ABNORMAL LOW (ref 4.22–5.81)
WBC: 7.3 10*3/uL (ref 4.0–10.5)

## 2011-03-08 NOTE — ED Notes (Signed)
PT. REPORTS SYNCOPAL EPISODE AFTER HEMODIALYSIS THIS EVENING , STATES HIS BLOOD PRESSURE DROPPED ,  REPORTS HEADACHE / MID ABDOMINAL PAIN AFTER EATING A LOT. ALERT AND ORIENTED AT TRIAGE.

## 2011-03-09 ENCOUNTER — Emergency Department (HOSPITAL_COMMUNITY): Payer: Medicare Other

## 2011-03-09 ENCOUNTER — Other Ambulatory Visit: Payer: Self-pay

## 2011-03-09 LAB — BASIC METABOLIC PANEL
CO2: 31 mEq/L (ref 19–32)
Chloride: 97 mEq/L (ref 96–112)
GFR calc Af Amer: 18 mL/min — ABNORMAL LOW (ref >90)
Sodium: 138 mEq/L (ref 135–145)

## 2011-03-09 LAB — URINE MICROSCOPIC-ADD ON

## 2011-03-09 LAB — URINALYSIS, ROUTINE W REFLEX MICROSCOPIC
Bilirubin Urine: NEGATIVE
Glucose, UA: NEGATIVE mg/dL
Ketones, ur: NEGATIVE mg/dL
Leukocytes, UA: NEGATIVE
Nitrite: NEGATIVE
Protein, ur: 100 mg/dL — AB
Specific Gravity, Urine: 1.011 (ref 1.005–1.030)
Urobilinogen, UA: 1 mg/dL (ref 0.0–1.0)
pH: 6.5 (ref 5.0–8.0)

## 2011-03-09 LAB — POCT I-STAT TROPONIN I

## 2011-03-09 NOTE — ED Notes (Signed)
Pt resting quietly with no s/s of any pain or distress noted. Pt denies any pain, SOB, dizziness or n/v/d at this time. Plan of care is updated with verbal understanding and will continue to monitor pt. Pt is awaiting test results for disposition.

## 2011-03-09 NOTE — ED Notes (Signed)
Pt denies any pain or questions upon discharge. 

## 2011-03-11 ENCOUNTER — Telehealth: Payer: Self-pay | Admitting: Gastroenterology

## 2011-03-11 NOTE — Telephone Encounter (Signed)
Patient was scheduled for CCK Hida on 03/04/11 he was a no show.  I have rescheduled it for 04/06/11 [facility] 8:00.  Patient's wife was advised he needs to be NPO, arrive at 7:45 and register in radiology

## 2011-03-31 DIAGNOSIS — N2581 Secondary hyperparathyroidism of renal origin: Secondary | ICD-10-CM | POA: Diagnosis not present

## 2011-03-31 DIAGNOSIS — D509 Iron deficiency anemia, unspecified: Secondary | ICD-10-CM | POA: Diagnosis not present

## 2011-03-31 DIAGNOSIS — Z992 Dependence on renal dialysis: Secondary | ICD-10-CM | POA: Diagnosis not present

## 2011-03-31 DIAGNOSIS — E119 Type 2 diabetes mellitus without complications: Secondary | ICD-10-CM | POA: Diagnosis not present

## 2011-03-31 DIAGNOSIS — N186 End stage renal disease: Secondary | ICD-10-CM | POA: Diagnosis not present

## 2011-03-31 DIAGNOSIS — D631 Anemia in chronic kidney disease: Secondary | ICD-10-CM | POA: Diagnosis not present

## 2011-04-02 ENCOUNTER — Other Ambulatory Visit (HOSPITAL_COMMUNITY): Payer: Medicare Other

## 2011-04-06 ENCOUNTER — Encounter (HOSPITAL_COMMUNITY)
Admission: RE | Admit: 2011-04-06 | Discharge: 2011-04-06 | Disposition: A | Discharge status: Home / Self Care | Payer: Medicare Other | Source: Ambulatory Visit | Attending: Gastroenterology | Admitting: Gastroenterology

## 2011-04-06 ENCOUNTER — Encounter (HOSPITAL_COMMUNITY): Payer: Self-pay

## 2011-04-06 DIAGNOSIS — R109 Unspecified abdominal pain: Secondary | ICD-10-CM | POA: Insufficient documentation

## 2011-04-06 DIAGNOSIS — R1013 Epigastric pain: Secondary | ICD-10-CM

## 2011-04-06 MED ORDER — TECHNETIUM TC 99M MEBROFENIN IV KIT
4.9000 | PACK | Freq: Once | INTRAVENOUS | Status: AC | PRN
  Administered 2011-04-06: 4.9 via INTRAVENOUS

## 2011-04-06 MED ORDER — SINCALIDE 5 MCG IJ SOLR: 0.0200 ug/kg | Freq: Once | INTRAMUSCULAR | Status: DC

## 2011-04-20 ENCOUNTER — Other Ambulatory Visit: Payer: Self-pay | Admitting: Dermatology

## 2011-04-20 DIAGNOSIS — L570 Actinic keratosis: Secondary | ICD-10-CM | POA: Diagnosis not present

## 2011-04-20 DIAGNOSIS — D485 Neoplasm of uncertain behavior of skin: Secondary | ICD-10-CM | POA: Diagnosis not present

## 2011-04-20 DIAGNOSIS — L820 Inflamed seborrheic keratosis: Secondary | ICD-10-CM | POA: Diagnosis not present

## 2011-04-20 NOTE — ED Provider Notes (Signed)
History     CSN: [id number]  Arrival date & time 03/08/11  4098   First MD Initiated Contact with Patient 03/09/11 0028      Chief Complaint  Patient presents with  . Loss of Consciousness    HPI: Patient is a 71 y.o. male presenting with syncope.  Loss of Consciousness This is a recurrent problem. The current episode started today. The problem has been gradually worsening. Associated symptoms include weakness. Pertinent negatives include no chest pain, coughing, fever, vertigo or visual change. He has tried nothing for the symptoms.  Pt reports weakness followed by syncopal episode today after dialysis. States recently started dialysis and with last 2 to 3 dialysis treatments he becomes very weak immediately following dialysis. Efforts were made today to alter his dialysis (not pull off as much fluid) in order to avoid this weakness and pt states he was feeling better initially after his treatment. But when he attempted to leave the facility he again became very weak and ultimately "fainted". Denies CP, SOB, nausea or other sx's other than weakness prior to or after the event. No unilateral symptoms of weakness or visual disturbances. No bowel or bladder incontinence. Denies recent illnesses. Pt states his symptoms have completely resolved and he now "feels fine".  Past Medical History  Diagnosis Date  . End stage renal disease     hemodialysis 3 times a week  . Seasonal allergies   . Hyperlipidemia   . Anemia   . Depression   . Diabetes mellitus     controlled by diet  . Macular degeneration     both eyes  . GERD (gastroesophageal reflux disease)   . Hypertension   . CVA (cerebral infarction)     2004  . Peptic ulcer     bleeding, 1969  . Kidney stones   . Renal insufficiency   . Diverticulosis   . Tubular adenoma of colon 07/2001  . Barrett's esophagus 05/2003  . Stroke 2004  . Colon polyps   . Renal failure   . Hemodialysis patient     Past Surgical History    Procedure Date  . Av fistula placement 2009  . Laminectomy 1969  . Tonsillectomy 1964  . Corneal transplant 1999    right eye  . Cystoscopy     kidney stones  . Back surgery     Family History  Problem Relation Age of Onset  . Colon cancer Neg Hx   . Stomach cancer Mother   . Hypertension Father     Died of heart attack    History  Substance Use Topics  . Smoking status: Former Smoker    Quit date: 01/20/1998  . Smokeless tobacco: Never Used  . Alcohol Use: No      Review of Systems  Constitutional: Negative for fever.  HENT: Negative.   Eyes: Negative.   Respiratory: Negative.  Negative for cough.   Cardiovascular: Positive for syncope. Negative for chest pain.  Gastrointestinal: Negative.   Genitourinary: Negative.   Musculoskeletal: Negative.   Skin: Negative.   Neurological: Positive for weakness. Negative for vertigo.  Hematological: Negative.   Psychiatric/Behavioral: Negative.     Allergies  Penicillins  Home Medications   Current Outpatient Rx  Name Route Sig Dispense Refill  . AMIODARONE HCL 200 MG PO TABS Oral Take 200 mg by mouth daily.     . ASPIRIN 81 MG PO TABS Oral Take 81 mg by mouth daily.     . ATORVASTATIN CALCIUM 40  MG PO TABS Oral Take 20 mg by mouth daily.     [name] NEPHRO-VITE 0.8 MG PO TABS Oral Take 0.8 mg by mouth at bedtime.      [name] BROMOCRIPTINE MESYLATE 5 MG PO CAPS Oral Take 5 mg by mouth daily.     [name] CALCIUM ACETATE (PHOS BINDER) 667 MG/5ML PO SOLN Oral Take by mouth 3 (three) times daily with meals.      [name] FLUOXETINE HCL 20 MG PO CAPS Oral Take 40 mg by mouth daily.     [name] LEVOCETIRIZINE DIHYDROCHLORIDE 5 MG PO TABS Oral Take 5 mg by mouth every evening.     [name] OMEPRAZOLE 20 MG PO CPDR Oral Take 20 mg by mouth daily.      [name] ZETIA 10 MG PO TABS Oral Take 10 mg by mouth daily.       BP 146/78  Pulse 57  Temp(Src) 97.8 F (36.6 C) (Oral)  Resp 18  SpO2 96%  Physical Exam  Constitutional: He is oriented to person, place,  and time. He appears well-developed and well-nourished.  HENT:  Head: Normocephalic and atraumatic.  Eyes: Conjunctivae are normal.  Neck: Neck supple.  Cardiovascular: Normal rate and regular rhythm.        LUE AV graft w/ positive bruit and thrill.   Pulmonary/Chest: Effort normal and breath sounds normal.  Abdominal: Soft. Bowel sounds are normal.  Musculoskeletal: Normal range of motion.  Neurological: He is alert and oriented to person, place, and time. He has normal strength and normal reflexes. No cranial nerve deficit. Coordination and gait normal. GCS eye subscore is 4. GCS verbal subscore is 5. GCS motor subscore is 6.  Skin: Skin is warm and dry.  Psychiatric: He has a normal mood and affect.    ED Course  Procedures    Date: 03/09/2011  Rate: 60  Rhythm:  sinus rhythm w/ 1st degree AV block  QRS Axis: normal  Intervals: QT prolonged  ST/T Wave abnormalities: normal  Conduction Disutrbances:none  Narrative Interpretation:  Prolonged QT and 1st degree AV block new since EKG from 01/28/2011  Old EKG Reviewed: changes noted  Findings discussed w/ pt and spouse. Pt continues to deny symptoms. I have discussed pt w/ Dr Hyacinth Meeker. Will consult w/ nephrology for plan.  0220: Discussed pt w/  Dr Lewis Moccasin who states pt may be d/c'd home. States pt should keep his scheduled appointment for dialysis tomorrow and they would again adjust his dialysate. Discussed plan w/ pt who is agreeable.  Labs Reviewed  BASIC METABOLIC PANEL - Abnormal; Notable for the following:    Potassium 3.2 (*)    Creatinine, Ser 3.58 (*)    GFR calc non Af Amer 16 (*)    GFR calc Af Amer 18 (*)    All other components within normal limits  CBC - Abnormal; Notable for the following:    RBC 3.39 (*)    Hemoglobin 11.3 (*)    HCT 34.0 (*)    MCV 100.3 (*)    All other components within normal limits  URINALYSIS, ROUTINE W REFLEX MICROSCOPIC - Abnormal; Notable for the following:    APPearance CLOUDY  (*)    Hgb urine dipstick TRACE (*)    Protein, ur 100 (*)    All other components within normal limits  URINE MICROSCOPIC-ADD ON - Abnormal; Notable for the following:    Squamous Epithelial / LPF FEW (*)    Bacteria, UA FEW (*)    Casts HYALINE  CASTS (*)    All other components within normal limits  DIFFERENTIAL  LAB REPORT - SCANNED  POCT I-STAT TROPONIN I   No results found.   1. Syncope       MDM  HPI/PE and clinical findings c/w syncopal episode likely related to intolerance to dialysis as there have been repeated episodes of weakness after dialysis recently. EKG w/o acute findings, Trop I neg, no focal neurological findings. Symptoms have resolved.        Leanne Chang, NP 04/20/11 2013

## 2011-04-20 NOTE — ED Provider Notes (Signed)
Medical screening examination/treatment/procedure(s) were performed by non-physician practitioner and as supervising physician I was immediately available for consultation/collaboration.   Vida Roller, MD 04/20/11 2312

## 2011-04-21 DIAGNOSIS — E1129 Type 2 diabetes mellitus with other diabetic kidney complication: Secondary | ICD-10-CM | POA: Diagnosis not present

## 2011-04-27 DIAGNOSIS — E119 Type 2 diabetes mellitus without complications: Secondary | ICD-10-CM | POA: Diagnosis not present

## 2011-04-29 DIAGNOSIS — N186 End stage renal disease: Secondary | ICD-10-CM | POA: Diagnosis not present

## 2011-04-30 DIAGNOSIS — D631 Anemia in chronic kidney disease: Secondary | ICD-10-CM | POA: Diagnosis not present

## 2011-04-30 DIAGNOSIS — D509 Iron deficiency anemia, unspecified: Secondary | ICD-10-CM | POA: Diagnosis not present

## 2011-04-30 DIAGNOSIS — N186 End stage renal disease: Secondary | ICD-10-CM | POA: Diagnosis not present

## 2011-04-30 DIAGNOSIS — E119 Type 2 diabetes mellitus without complications: Secondary | ICD-10-CM | POA: Diagnosis not present

## 2011-04-30 DIAGNOSIS — N2581 Secondary hyperparathyroidism of renal origin: Secondary | ICD-10-CM | POA: Diagnosis not present

## 2011-05-04 DIAGNOSIS — E119 Type 2 diabetes mellitus without complications: Secondary | ICD-10-CM | POA: Diagnosis not present

## 2011-05-04 DIAGNOSIS — N186 End stage renal disease: Secondary | ICD-10-CM | POA: Diagnosis not present

## 2011-05-04 DIAGNOSIS — R7989 Other specified abnormal findings of blood chemistry: Secondary | ICD-10-CM | POA: Diagnosis not present

## 2011-05-04 DIAGNOSIS — Z7682 Awaiting organ transplant status: Secondary | ICD-10-CM | POA: Diagnosis not present

## 2011-05-04 DIAGNOSIS — I10 Essential (primary) hypertension: Secondary | ICD-10-CM | POA: Diagnosis not present

## 2011-05-06 DIAGNOSIS — N289 Disorder of kidney and ureter, unspecified: Secondary | ICD-10-CM | POA: Diagnosis not present

## 2011-05-06 DIAGNOSIS — E119 Type 2 diabetes mellitus without complications: Secondary | ICD-10-CM | POA: Diagnosis not present

## 2011-05-20 DIAGNOSIS — I4891 Unspecified atrial fibrillation: Secondary | ICD-10-CM | POA: Diagnosis not present

## 2011-05-20 DIAGNOSIS — I10 Essential (primary) hypertension: Secondary | ICD-10-CM | POA: Diagnosis not present

## 2011-05-26 DIAGNOSIS — N2581 Secondary hyperparathyroidism of renal origin: Secondary | ICD-10-CM | POA: Diagnosis not present

## 2011-05-26 DIAGNOSIS — D509 Iron deficiency anemia, unspecified: Secondary | ICD-10-CM | POA: Diagnosis not present

## 2011-05-26 DIAGNOSIS — N186 End stage renal disease: Secondary | ICD-10-CM | POA: Diagnosis not present

## 2011-05-27 DIAGNOSIS — N186 End stage renal disease: Secondary | ICD-10-CM | POA: Diagnosis not present

## 2011-05-28 DIAGNOSIS — D509 Iron deficiency anemia, unspecified: Secondary | ICD-10-CM | POA: Diagnosis not present

## 2011-05-28 DIAGNOSIS — D631 Anemia in chronic kidney disease: Secondary | ICD-10-CM | POA: Diagnosis not present

## 2011-05-28 DIAGNOSIS — N2581 Secondary hyperparathyroidism of renal origin: Secondary | ICD-10-CM | POA: Diagnosis not present

## 2011-05-28 DIAGNOSIS — N186 End stage renal disease: Secondary | ICD-10-CM | POA: Diagnosis not present

## 2011-05-28 DIAGNOSIS — E119 Type 2 diabetes mellitus without complications: Secondary | ICD-10-CM | POA: Diagnosis not present

## 2011-06-15 DIAGNOSIS — N186 End stage renal disease: Secondary | ICD-10-CM | POA: Diagnosis not present

## 2011-06-15 DIAGNOSIS — I6529 Occlusion and stenosis of unspecified carotid artery: Secondary | ICD-10-CM | POA: Diagnosis not present

## 2011-06-27 DIAGNOSIS — N186 End stage renal disease: Secondary | ICD-10-CM | POA: Diagnosis not present

## 2011-06-28 DIAGNOSIS — E119 Type 2 diabetes mellitus without complications: Secondary | ICD-10-CM | POA: Diagnosis not present

## 2011-06-28 DIAGNOSIS — N2581 Secondary hyperparathyroidism of renal origin: Secondary | ICD-10-CM | POA: Diagnosis not present

## 2011-06-28 DIAGNOSIS — N186 End stage renal disease: Secondary | ICD-10-CM | POA: Diagnosis not present

## 2011-06-28 DIAGNOSIS — D631 Anemia in chronic kidney disease: Secondary | ICD-10-CM | POA: Diagnosis not present

## 2011-06-30 DIAGNOSIS — N186 End stage renal disease: Secondary | ICD-10-CM | POA: Diagnosis not present

## 2011-06-30 DIAGNOSIS — D631 Anemia in chronic kidney disease: Secondary | ICD-10-CM | POA: Diagnosis not present

## 2011-06-30 DIAGNOSIS — E119 Type 2 diabetes mellitus without complications: Secondary | ICD-10-CM | POA: Diagnosis not present

## 2011-06-30 DIAGNOSIS — N2581 Secondary hyperparathyroidism of renal origin: Secondary | ICD-10-CM | POA: Diagnosis not present

## 2011-07-02 DIAGNOSIS — N039 Chronic nephritic syndrome with unspecified morphologic changes: Secondary | ICD-10-CM | POA: Diagnosis not present

## 2011-07-02 DIAGNOSIS — E119 Type 2 diabetes mellitus without complications: Secondary | ICD-10-CM | POA: Diagnosis not present

## 2011-07-02 DIAGNOSIS — N2581 Secondary hyperparathyroidism of renal origin: Secondary | ICD-10-CM | POA: Diagnosis not present

## 2011-07-02 DIAGNOSIS — N186 End stage renal disease: Secondary | ICD-10-CM | POA: Diagnosis not present

## 2011-07-05 DIAGNOSIS — N039 Chronic nephritic syndrome with unspecified morphologic changes: Secondary | ICD-10-CM | POA: Diagnosis not present

## 2011-07-05 DIAGNOSIS — E119 Type 2 diabetes mellitus without complications: Secondary | ICD-10-CM | POA: Diagnosis not present

## 2011-07-05 DIAGNOSIS — N186 End stage renal disease: Secondary | ICD-10-CM | POA: Diagnosis not present

## 2011-07-05 DIAGNOSIS — N2581 Secondary hyperparathyroidism of renal origin: Secondary | ICD-10-CM | POA: Diagnosis not present

## 2011-07-06 DIAGNOSIS — D444 Neoplasm of uncertain behavior of craniopharyngeal duct: Secondary | ICD-10-CM | POA: Diagnosis not present

## 2011-07-06 DIAGNOSIS — D443 Neoplasm of uncertain behavior of pituitary gland: Secondary | ICD-10-CM | POA: Diagnosis not present

## 2011-07-07 DIAGNOSIS — N186 End stage renal disease: Secondary | ICD-10-CM | POA: Diagnosis not present

## 2011-07-07 DIAGNOSIS — N039 Chronic nephritic syndrome with unspecified morphologic changes: Secondary | ICD-10-CM | POA: Diagnosis not present

## 2011-07-07 DIAGNOSIS — N2581 Secondary hyperparathyroidism of renal origin: Secondary | ICD-10-CM | POA: Diagnosis not present

## 2011-07-07 DIAGNOSIS — E119 Type 2 diabetes mellitus without complications: Secondary | ICD-10-CM | POA: Diagnosis not present

## 2011-07-08 DIAGNOSIS — D235 Other benign neoplasm of skin of trunk: Secondary | ICD-10-CM | POA: Diagnosis not present

## 2011-07-08 DIAGNOSIS — L570 Actinic keratosis: Secondary | ICD-10-CM | POA: Diagnosis not present

## 2011-07-09 DIAGNOSIS — N186 End stage renal disease: Secondary | ICD-10-CM | POA: Diagnosis not present

## 2011-07-09 DIAGNOSIS — E119 Type 2 diabetes mellitus without complications: Secondary | ICD-10-CM | POA: Diagnosis not present

## 2011-07-09 DIAGNOSIS — D631 Anemia in chronic kidney disease: Secondary | ICD-10-CM | POA: Diagnosis not present

## 2011-07-09 DIAGNOSIS — N2581 Secondary hyperparathyroidism of renal origin: Secondary | ICD-10-CM | POA: Diagnosis not present

## 2011-07-12 DIAGNOSIS — N039 Chronic nephritic syndrome with unspecified morphologic changes: Secondary | ICD-10-CM | POA: Diagnosis not present

## 2011-07-12 DIAGNOSIS — N2581 Secondary hyperparathyroidism of renal origin: Secondary | ICD-10-CM | POA: Diagnosis not present

## 2011-07-12 DIAGNOSIS — N186 End stage renal disease: Secondary | ICD-10-CM | POA: Diagnosis not present

## 2011-07-12 DIAGNOSIS — D509 Iron deficiency anemia, unspecified: Secondary | ICD-10-CM | POA: Diagnosis not present

## 2011-07-12 DIAGNOSIS — D631 Anemia in chronic kidney disease: Secondary | ICD-10-CM | POA: Diagnosis not present

## 2011-07-13 DIAGNOSIS — E789 Disorder of lipoprotein metabolism, unspecified: Secondary | ICD-10-CM | POA: Diagnosis not present

## 2011-07-13 DIAGNOSIS — D352 Benign neoplasm of pituitary gland: Secondary | ICD-10-CM | POA: Diagnosis not present

## 2011-07-13 DIAGNOSIS — D353 Benign neoplasm of craniopharyngeal duct: Secondary | ICD-10-CM | POA: Diagnosis not present

## 2011-07-13 DIAGNOSIS — N189 Chronic kidney disease, unspecified: Secondary | ICD-10-CM | POA: Diagnosis not present

## 2011-07-14 DIAGNOSIS — D631 Anemia in chronic kidney disease: Secondary | ICD-10-CM | POA: Diagnosis not present

## 2011-07-14 DIAGNOSIS — N2581 Secondary hyperparathyroidism of renal origin: Secondary | ICD-10-CM | POA: Diagnosis not present

## 2011-07-14 DIAGNOSIS — D509 Iron deficiency anemia, unspecified: Secondary | ICD-10-CM | POA: Diagnosis not present

## 2011-07-14 DIAGNOSIS — N186 End stage renal disease: Secondary | ICD-10-CM | POA: Diagnosis not present

## 2011-07-16 DIAGNOSIS — D631 Anemia in chronic kidney disease: Secondary | ICD-10-CM | POA: Diagnosis not present

## 2011-07-16 DIAGNOSIS — N039 Chronic nephritic syndrome with unspecified morphologic changes: Secondary | ICD-10-CM | POA: Diagnosis not present

## 2011-07-16 DIAGNOSIS — D509 Iron deficiency anemia, unspecified: Secondary | ICD-10-CM | POA: Diagnosis not present

## 2011-07-16 DIAGNOSIS — N2581 Secondary hyperparathyroidism of renal origin: Secondary | ICD-10-CM | POA: Diagnosis not present

## 2011-07-16 DIAGNOSIS — N186 End stage renal disease: Secondary | ICD-10-CM | POA: Diagnosis not present

## 2011-07-19 DIAGNOSIS — D509 Iron deficiency anemia, unspecified: Secondary | ICD-10-CM | POA: Diagnosis not present

## 2011-07-19 DIAGNOSIS — D631 Anemia in chronic kidney disease: Secondary | ICD-10-CM | POA: Diagnosis not present

## 2011-07-19 DIAGNOSIS — N2581 Secondary hyperparathyroidism of renal origin: Secondary | ICD-10-CM | POA: Diagnosis not present

## 2011-07-19 DIAGNOSIS — N186 End stage renal disease: Secondary | ICD-10-CM | POA: Diagnosis not present

## 2011-07-21 DIAGNOSIS — N039 Chronic nephritic syndrome with unspecified morphologic changes: Secondary | ICD-10-CM | POA: Diagnosis not present

## 2011-07-21 DIAGNOSIS — D509 Iron deficiency anemia, unspecified: Secondary | ICD-10-CM | POA: Diagnosis not present

## 2011-07-21 DIAGNOSIS — N186 End stage renal disease: Secondary | ICD-10-CM | POA: Diagnosis not present

## 2011-07-21 DIAGNOSIS — N2581 Secondary hyperparathyroidism of renal origin: Secondary | ICD-10-CM | POA: Diagnosis not present

## 2011-07-21 DIAGNOSIS — E1129 Type 2 diabetes mellitus with other diabetic kidney complication: Secondary | ICD-10-CM | POA: Diagnosis not present

## 2011-07-23 DIAGNOSIS — D631 Anemia in chronic kidney disease: Secondary | ICD-10-CM | POA: Diagnosis not present

## 2011-07-23 DIAGNOSIS — N186 End stage renal disease: Secondary | ICD-10-CM | POA: Diagnosis not present

## 2011-07-23 DIAGNOSIS — D509 Iron deficiency anemia, unspecified: Secondary | ICD-10-CM | POA: Diagnosis not present

## 2011-07-23 DIAGNOSIS — N2581 Secondary hyperparathyroidism of renal origin: Secondary | ICD-10-CM | POA: Diagnosis not present

## 2011-07-26 DIAGNOSIS — N186 End stage renal disease: Secondary | ICD-10-CM | POA: Diagnosis not present

## 2011-07-26 DIAGNOSIS — D509 Iron deficiency anemia, unspecified: Secondary | ICD-10-CM | POA: Diagnosis not present

## 2011-07-26 DIAGNOSIS — N039 Chronic nephritic syndrome with unspecified morphologic changes: Secondary | ICD-10-CM | POA: Diagnosis not present

## 2011-07-26 DIAGNOSIS — N2581 Secondary hyperparathyroidism of renal origin: Secondary | ICD-10-CM | POA: Diagnosis not present

## 2011-07-28 DIAGNOSIS — D509 Iron deficiency anemia, unspecified: Secondary | ICD-10-CM | POA: Diagnosis not present

## 2011-07-28 DIAGNOSIS — N186 End stage renal disease: Secondary | ICD-10-CM | POA: Diagnosis not present

## 2011-07-28 DIAGNOSIS — D631 Anemia in chronic kidney disease: Secondary | ICD-10-CM | POA: Diagnosis not present

## 2011-07-28 DIAGNOSIS — N2581 Secondary hyperparathyroidism of renal origin: Secondary | ICD-10-CM | POA: Diagnosis not present

## 2011-08-16 DIAGNOSIS — E1159 Type 2 diabetes mellitus with other circulatory complications: Secondary | ICD-10-CM | POA: Diagnosis not present

## 2011-08-16 DIAGNOSIS — L608 Other nail disorders: Secondary | ICD-10-CM | POA: Diagnosis not present

## 2011-08-16 DIAGNOSIS — I739 Peripheral vascular disease, unspecified: Secondary | ICD-10-CM | POA: Diagnosis not present

## 2011-08-16 DIAGNOSIS — L84 Corns and callosities: Secondary | ICD-10-CM | POA: Diagnosis not present

## 2011-08-27 DIAGNOSIS — N186 End stage renal disease: Secondary | ICD-10-CM | POA: Diagnosis not present

## 2011-08-30 DIAGNOSIS — N2581 Secondary hyperparathyroidism of renal origin: Secondary | ICD-10-CM | POA: Diagnosis not present

## 2011-08-30 DIAGNOSIS — D631 Anemia in chronic kidney disease: Secondary | ICD-10-CM | POA: Diagnosis not present

## 2011-08-30 DIAGNOSIS — Z992 Dependence on renal dialysis: Secondary | ICD-10-CM | POA: Diagnosis not present

## 2011-08-30 DIAGNOSIS — N186 End stage renal disease: Secondary | ICD-10-CM | POA: Diagnosis not present

## 2011-08-30 DIAGNOSIS — D509 Iron deficiency anemia, unspecified: Secondary | ICD-10-CM | POA: Diagnosis not present

## 2011-09-10 DIAGNOSIS — F325 Major depressive disorder, single episode, in full remission: Secondary | ICD-10-CM | POA: Diagnosis not present

## 2011-09-10 DIAGNOSIS — Z940 Kidney transplant status: Secondary | ICD-10-CM | POA: Diagnosis not present

## 2011-09-18 DIAGNOSIS — N186 End stage renal disease: Secondary | ICD-10-CM | POA: Diagnosis not present

## 2011-09-20 DIAGNOSIS — N186 End stage renal disease: Secondary | ICD-10-CM | POA: Diagnosis not present

## 2011-09-20 DIAGNOSIS — N2581 Secondary hyperparathyroidism of renal origin: Secondary | ICD-10-CM | POA: Diagnosis not present

## 2011-09-22 DIAGNOSIS — N2581 Secondary hyperparathyroidism of renal origin: Secondary | ICD-10-CM | POA: Diagnosis not present

## 2011-09-22 DIAGNOSIS — N186 End stage renal disease: Secondary | ICD-10-CM | POA: Diagnosis not present

## 2011-09-24 DIAGNOSIS — N2581 Secondary hyperparathyroidism of renal origin: Secondary | ICD-10-CM | POA: Diagnosis not present

## 2011-09-24 DIAGNOSIS — N186 End stage renal disease: Secondary | ICD-10-CM | POA: Diagnosis not present

## 2011-09-27 DIAGNOSIS — E1129 Type 2 diabetes mellitus with other diabetic kidney complication: Secondary | ICD-10-CM | POA: Diagnosis not present

## 2011-09-27 DIAGNOSIS — D509 Iron deficiency anemia, unspecified: Secondary | ICD-10-CM | POA: Diagnosis not present

## 2011-09-27 DIAGNOSIS — N186 End stage renal disease: Secondary | ICD-10-CM | POA: Diagnosis not present

## 2011-09-27 DIAGNOSIS — D539 Nutritional anemia, unspecified: Secondary | ICD-10-CM | POA: Diagnosis not present

## 2011-09-27 DIAGNOSIS — E878 Other disorders of electrolyte and fluid balance, not elsewhere classified: Secondary | ICD-10-CM | POA: Diagnosis not present

## 2011-09-27 DIAGNOSIS — N2581 Secondary hyperparathyroidism of renal origin: Secondary | ICD-10-CM | POA: Diagnosis not present

## 2011-10-07 DIAGNOSIS — L570 Actinic keratosis: Secondary | ICD-10-CM | POA: Diagnosis not present

## 2011-10-20 DIAGNOSIS — E1129 Type 2 diabetes mellitus with other diabetic kidney complication: Secondary | ICD-10-CM | POA: Diagnosis not present

## 2011-10-27 DIAGNOSIS — N186 End stage renal disease: Secondary | ICD-10-CM | POA: Diagnosis not present

## 2011-10-29 DIAGNOSIS — N186 End stage renal disease: Secondary | ICD-10-CM | POA: Diagnosis not present

## 2011-10-29 DIAGNOSIS — D509 Iron deficiency anemia, unspecified: Secondary | ICD-10-CM | POA: Diagnosis not present

## 2011-10-29 DIAGNOSIS — D631 Anemia in chronic kidney disease: Secondary | ICD-10-CM | POA: Diagnosis not present

## 2011-10-29 DIAGNOSIS — N2581 Secondary hyperparathyroidism of renal origin: Secondary | ICD-10-CM | POA: Diagnosis not present

## 2011-11-02 DIAGNOSIS — N2581 Secondary hyperparathyroidism of renal origin: Secondary | ICD-10-CM | POA: Diagnosis not present

## 2011-11-02 DIAGNOSIS — N186 End stage renal disease: Secondary | ICD-10-CM | POA: Diagnosis not present

## 2011-11-04 DIAGNOSIS — N186 End stage renal disease: Secondary | ICD-10-CM | POA: Diagnosis not present

## 2011-11-04 DIAGNOSIS — N2581 Secondary hyperparathyroidism of renal origin: Secondary | ICD-10-CM | POA: Diagnosis not present

## 2011-11-06 DIAGNOSIS — N186 End stage renal disease: Secondary | ICD-10-CM | POA: Diagnosis not present

## 2011-11-06 DIAGNOSIS — N2581 Secondary hyperparathyroidism of renal origin: Secondary | ICD-10-CM | POA: Diagnosis not present

## 2011-11-16 DIAGNOSIS — I4891 Unspecified atrial fibrillation: Secondary | ICD-10-CM | POA: Diagnosis not present

## 2011-11-16 DIAGNOSIS — I495 Sick sinus syndrome: Secondary | ICD-10-CM | POA: Diagnosis not present

## 2011-11-24 DIAGNOSIS — R5383 Other fatigue: Secondary | ICD-10-CM | POA: Diagnosis not present

## 2011-11-25 DIAGNOSIS — I495 Sick sinus syndrome: Secondary | ICD-10-CM | POA: Diagnosis not present

## 2011-11-25 DIAGNOSIS — I4891 Unspecified atrial fibrillation: Secondary | ICD-10-CM | POA: Diagnosis not present

## 2011-11-25 DIAGNOSIS — I359 Nonrheumatic aortic valve disorder, unspecified: Secondary | ICD-10-CM | POA: Diagnosis not present

## 2011-11-27 DIAGNOSIS — N186 End stage renal disease: Secondary | ICD-10-CM | POA: Diagnosis not present

## 2011-11-29 DIAGNOSIS — D509 Iron deficiency anemia, unspecified: Secondary | ICD-10-CM | POA: Diagnosis not present

## 2011-11-29 DIAGNOSIS — D631 Anemia in chronic kidney disease: Secondary | ICD-10-CM | POA: Diagnosis not present

## 2011-11-29 DIAGNOSIS — N2581 Secondary hyperparathyroidism of renal origin: Secondary | ICD-10-CM | POA: Diagnosis not present

## 2011-11-29 DIAGNOSIS — N186 End stage renal disease: Secondary | ICD-10-CM | POA: Diagnosis not present

## 2011-11-29 DIAGNOSIS — Z23 Encounter for immunization: Secondary | ICD-10-CM | POA: Diagnosis not present

## 2011-12-02 DIAGNOSIS — H353290 Exudative age-related macular degeneration, unspecified eye, stage unspecified: Secondary | ICD-10-CM | POA: Insufficient documentation

## 2011-12-02 DIAGNOSIS — H3320 Serous retinal detachment, unspecified eye: Secondary | ICD-10-CM | POA: Insufficient documentation

## 2011-12-27 DIAGNOSIS — N186 End stage renal disease: Secondary | ICD-10-CM | POA: Diagnosis not present

## 2011-12-29 DIAGNOSIS — N186 End stage renal disease: Secondary | ICD-10-CM | POA: Diagnosis not present

## 2011-12-29 DIAGNOSIS — N2581 Secondary hyperparathyroidism of renal origin: Secondary | ICD-10-CM | POA: Diagnosis not present

## 2011-12-29 DIAGNOSIS — D509 Iron deficiency anemia, unspecified: Secondary | ICD-10-CM | POA: Diagnosis not present

## 2011-12-29 DIAGNOSIS — D631 Anemia in chronic kidney disease: Secondary | ICD-10-CM | POA: Diagnosis not present

## 2012-01-18 DIAGNOSIS — E119 Type 2 diabetes mellitus without complications: Secondary | ICD-10-CM | POA: Diagnosis not present

## 2012-01-18 DIAGNOSIS — E7800 Pure hypercholesterolemia, unspecified: Secondary | ICD-10-CM | POA: Diagnosis not present

## 2012-01-18 DIAGNOSIS — I10 Essential (primary) hypertension: Secondary | ICD-10-CM | POA: Diagnosis not present

## 2012-01-18 DIAGNOSIS — Z125 Encounter for screening for malignant neoplasm of prostate: Secondary | ICD-10-CM | POA: Diagnosis not present

## 2012-01-18 DIAGNOSIS — D443 Neoplasm of uncertain behavior of pituitary gland: Secondary | ICD-10-CM | POA: Diagnosis not present

## 2012-01-18 DIAGNOSIS — E789 Disorder of lipoprotein metabolism, unspecified: Secondary | ICD-10-CM | POA: Diagnosis not present

## 2012-01-18 DIAGNOSIS — Z0000 Encounter for general adult medical examination without abnormal findings: Secondary | ICD-10-CM | POA: Diagnosis not present

## 2012-01-19 DIAGNOSIS — E1129 Type 2 diabetes mellitus with other diabetic kidney complication: Secondary | ICD-10-CM | POA: Diagnosis not present

## 2012-01-20 DIAGNOSIS — Z7682 Awaiting organ transplant status: Secondary | ICD-10-CM | POA: Diagnosis not present

## 2012-01-25 DIAGNOSIS — Z0000 Encounter for general adult medical examination without abnormal findings: Secondary | ICD-10-CM | POA: Diagnosis not present

## 2012-01-25 DIAGNOSIS — E119 Type 2 diabetes mellitus without complications: Secondary | ICD-10-CM | POA: Diagnosis not present

## 2012-01-25 DIAGNOSIS — N189 Chronic kidney disease, unspecified: Secondary | ICD-10-CM | POA: Diagnosis not present

## 2012-01-25 DIAGNOSIS — Z1212 Encounter for screening for malignant neoplasm of rectum: Secondary | ICD-10-CM | POA: Diagnosis not present

## 2012-01-25 DIAGNOSIS — Z992 Dependence on renal dialysis: Secondary | ICD-10-CM | POA: Diagnosis not present

## 2012-01-25 DIAGNOSIS — I10 Essential (primary) hypertension: Secondary | ICD-10-CM | POA: Diagnosis not present

## 2012-01-25 DIAGNOSIS — R252 Cramp and spasm: Secondary | ICD-10-CM | POA: Diagnosis not present

## 2012-01-27 DIAGNOSIS — N186 End stage renal disease: Secondary | ICD-10-CM | POA: Diagnosis not present

## 2012-01-27 DIAGNOSIS — D353 Benign neoplasm of craniopharyngeal duct: Secondary | ICD-10-CM | POA: Diagnosis not present

## 2012-01-27 DIAGNOSIS — N19 Unspecified kidney failure: Secondary | ICD-10-CM | POA: Diagnosis not present

## 2012-01-27 DIAGNOSIS — D352 Benign neoplasm of pituitary gland: Secondary | ICD-10-CM | POA: Diagnosis not present

## 2012-01-27 DIAGNOSIS — N62 Hypertrophy of breast: Secondary | ICD-10-CM | POA: Diagnosis not present

## 2012-01-27 DIAGNOSIS — E789 Disorder of lipoprotein metabolism, unspecified: Secondary | ICD-10-CM | POA: Diagnosis not present

## 2012-01-28 DIAGNOSIS — N2581 Secondary hyperparathyroidism of renal origin: Secondary | ICD-10-CM | POA: Diagnosis not present

## 2012-01-28 DIAGNOSIS — N186 End stage renal disease: Secondary | ICD-10-CM | POA: Diagnosis not present

## 2012-01-28 DIAGNOSIS — N039 Chronic nephritic syndrome with unspecified morphologic changes: Secondary | ICD-10-CM | POA: Diagnosis not present

## 2012-01-28 DIAGNOSIS — D631 Anemia in chronic kidney disease: Secondary | ICD-10-CM | POA: Diagnosis not present

## 2012-01-28 DIAGNOSIS — D509 Iron deficiency anemia, unspecified: Secondary | ICD-10-CM | POA: Diagnosis not present

## 2012-01-31 DIAGNOSIS — N2581 Secondary hyperparathyroidism of renal origin: Secondary | ICD-10-CM | POA: Diagnosis not present

## 2012-01-31 DIAGNOSIS — N186 End stage renal disease: Secondary | ICD-10-CM | POA: Diagnosis not present

## 2012-01-31 DIAGNOSIS — D631 Anemia in chronic kidney disease: Secondary | ICD-10-CM | POA: Diagnosis not present

## 2012-01-31 DIAGNOSIS — D509 Iron deficiency anemia, unspecified: Secondary | ICD-10-CM | POA: Diagnosis not present

## 2012-02-02 DIAGNOSIS — N186 End stage renal disease: Secondary | ICD-10-CM | POA: Diagnosis not present

## 2012-02-02 DIAGNOSIS — N2581 Secondary hyperparathyroidism of renal origin: Secondary | ICD-10-CM | POA: Diagnosis not present

## 2012-02-02 DIAGNOSIS — D509 Iron deficiency anemia, unspecified: Secondary | ICD-10-CM | POA: Diagnosis not present

## 2012-02-02 DIAGNOSIS — N039 Chronic nephritic syndrome with unspecified morphologic changes: Secondary | ICD-10-CM | POA: Diagnosis not present

## 2012-02-04 DIAGNOSIS — N2581 Secondary hyperparathyroidism of renal origin: Secondary | ICD-10-CM | POA: Diagnosis not present

## 2012-02-04 DIAGNOSIS — N186 End stage renal disease: Secondary | ICD-10-CM | POA: Diagnosis not present

## 2012-02-04 DIAGNOSIS — D509 Iron deficiency anemia, unspecified: Secondary | ICD-10-CM | POA: Diagnosis not present

## 2012-02-04 DIAGNOSIS — N039 Chronic nephritic syndrome with unspecified morphologic changes: Secondary | ICD-10-CM | POA: Diagnosis not present

## 2012-02-07 DIAGNOSIS — D509 Iron deficiency anemia, unspecified: Secondary | ICD-10-CM | POA: Diagnosis not present

## 2012-02-07 DIAGNOSIS — D631 Anemia in chronic kidney disease: Secondary | ICD-10-CM | POA: Diagnosis not present

## 2012-02-07 DIAGNOSIS — N2581 Secondary hyperparathyroidism of renal origin: Secondary | ICD-10-CM | POA: Diagnosis not present

## 2012-02-07 DIAGNOSIS — N186 End stage renal disease: Secondary | ICD-10-CM | POA: Diagnosis not present

## 2012-02-09 DIAGNOSIS — N186 End stage renal disease: Secondary | ICD-10-CM | POA: Diagnosis not present

## 2012-02-09 DIAGNOSIS — N2581 Secondary hyperparathyroidism of renal origin: Secondary | ICD-10-CM | POA: Diagnosis not present

## 2012-02-09 DIAGNOSIS — D509 Iron deficiency anemia, unspecified: Secondary | ICD-10-CM | POA: Diagnosis not present

## 2012-02-09 DIAGNOSIS — N039 Chronic nephritic syndrome with unspecified morphologic changes: Secondary | ICD-10-CM | POA: Diagnosis not present

## 2012-02-11 DIAGNOSIS — D631 Anemia in chronic kidney disease: Secondary | ICD-10-CM | POA: Diagnosis not present

## 2012-02-11 DIAGNOSIS — N039 Chronic nephritic syndrome with unspecified morphologic changes: Secondary | ICD-10-CM | POA: Diagnosis not present

## 2012-02-11 DIAGNOSIS — N2581 Secondary hyperparathyroidism of renal origin: Secondary | ICD-10-CM | POA: Diagnosis not present

## 2012-02-11 DIAGNOSIS — N186 End stage renal disease: Secondary | ICD-10-CM | POA: Diagnosis not present

## 2012-02-11 DIAGNOSIS — D509 Iron deficiency anemia, unspecified: Secondary | ICD-10-CM | POA: Diagnosis not present

## 2012-02-14 DIAGNOSIS — D509 Iron deficiency anemia, unspecified: Secondary | ICD-10-CM | POA: Diagnosis not present

## 2012-02-14 DIAGNOSIS — N2581 Secondary hyperparathyroidism of renal origin: Secondary | ICD-10-CM | POA: Diagnosis not present

## 2012-02-14 DIAGNOSIS — N039 Chronic nephritic syndrome with unspecified morphologic changes: Secondary | ICD-10-CM | POA: Diagnosis not present

## 2012-02-14 DIAGNOSIS — N186 End stage renal disease: Secondary | ICD-10-CM | POA: Diagnosis not present

## 2012-02-16 DIAGNOSIS — D509 Iron deficiency anemia, unspecified: Secondary | ICD-10-CM | POA: Diagnosis not present

## 2012-02-16 DIAGNOSIS — N2581 Secondary hyperparathyroidism of renal origin: Secondary | ICD-10-CM | POA: Diagnosis not present

## 2012-02-16 DIAGNOSIS — N186 End stage renal disease: Secondary | ICD-10-CM | POA: Diagnosis not present

## 2012-02-16 DIAGNOSIS — D631 Anemia in chronic kidney disease: Secondary | ICD-10-CM | POA: Diagnosis not present

## 2012-02-18 DIAGNOSIS — D631 Anemia in chronic kidney disease: Secondary | ICD-10-CM | POA: Diagnosis not present

## 2012-02-18 DIAGNOSIS — N2581 Secondary hyperparathyroidism of renal origin: Secondary | ICD-10-CM | POA: Diagnosis not present

## 2012-02-18 DIAGNOSIS — N186 End stage renal disease: Secondary | ICD-10-CM | POA: Diagnosis not present

## 2012-02-18 DIAGNOSIS — D509 Iron deficiency anemia, unspecified: Secondary | ICD-10-CM | POA: Diagnosis not present

## 2012-02-21 DIAGNOSIS — N186 End stage renal disease: Secondary | ICD-10-CM | POA: Diagnosis not present

## 2012-02-21 DIAGNOSIS — N2581 Secondary hyperparathyroidism of renal origin: Secondary | ICD-10-CM | POA: Diagnosis not present

## 2012-02-21 DIAGNOSIS — N039 Chronic nephritic syndrome with unspecified morphologic changes: Secondary | ICD-10-CM | POA: Diagnosis not present

## 2012-02-21 DIAGNOSIS — D509 Iron deficiency anemia, unspecified: Secondary | ICD-10-CM | POA: Diagnosis not present

## 2012-02-23 DIAGNOSIS — N2581 Secondary hyperparathyroidism of renal origin: Secondary | ICD-10-CM | POA: Diagnosis not present

## 2012-02-23 DIAGNOSIS — N186 End stage renal disease: Secondary | ICD-10-CM | POA: Diagnosis not present

## 2012-02-23 DIAGNOSIS — D509 Iron deficiency anemia, unspecified: Secondary | ICD-10-CM | POA: Diagnosis not present

## 2012-02-23 DIAGNOSIS — N039 Chronic nephritic syndrome with unspecified morphologic changes: Secondary | ICD-10-CM | POA: Diagnosis not present

## 2012-02-25 DIAGNOSIS — D631 Anemia in chronic kidney disease: Secondary | ICD-10-CM | POA: Diagnosis not present

## 2012-02-25 DIAGNOSIS — D509 Iron deficiency anemia, unspecified: Secondary | ICD-10-CM | POA: Diagnosis not present

## 2012-02-25 DIAGNOSIS — N2581 Secondary hyperparathyroidism of renal origin: Secondary | ICD-10-CM | POA: Diagnosis not present

## 2012-02-25 DIAGNOSIS — N186 End stage renal disease: Secondary | ICD-10-CM | POA: Diagnosis not present

## 2012-02-26 DIAGNOSIS — N186 End stage renal disease: Secondary | ICD-10-CM | POA: Diagnosis not present

## 2012-02-28 DIAGNOSIS — Z992 Dependence on renal dialysis: Secondary | ICD-10-CM | POA: Diagnosis not present

## 2012-02-28 DIAGNOSIS — D509 Iron deficiency anemia, unspecified: Secondary | ICD-10-CM | POA: Diagnosis not present

## 2012-02-28 DIAGNOSIS — N2581 Secondary hyperparathyroidism of renal origin: Secondary | ICD-10-CM | POA: Diagnosis not present

## 2012-02-28 DIAGNOSIS — D631 Anemia in chronic kidney disease: Secondary | ICD-10-CM | POA: Diagnosis not present

## 2012-02-28 DIAGNOSIS — N186 End stage renal disease: Secondary | ICD-10-CM | POA: Diagnosis not present

## 2012-03-02 DIAGNOSIS — N2581 Secondary hyperparathyroidism of renal origin: Secondary | ICD-10-CM | POA: Diagnosis not present

## 2012-03-02 DIAGNOSIS — N186 End stage renal disease: Secondary | ICD-10-CM | POA: Diagnosis not present

## 2012-03-04 DIAGNOSIS — N186 End stage renal disease: Secondary | ICD-10-CM | POA: Diagnosis not present

## 2012-03-04 DIAGNOSIS — N2581 Secondary hyperparathyroidism of renal origin: Secondary | ICD-10-CM | POA: Diagnosis not present

## 2012-03-28 DIAGNOSIS — N186 End stage renal disease: Secondary | ICD-10-CM | POA: Diagnosis not present

## 2012-03-31 DIAGNOSIS — N2581 Secondary hyperparathyroidism of renal origin: Secondary | ICD-10-CM | POA: Diagnosis not present

## 2012-03-31 DIAGNOSIS — D509 Iron deficiency anemia, unspecified: Secondary | ICD-10-CM | POA: Diagnosis not present

## 2012-03-31 DIAGNOSIS — N186 End stage renal disease: Secondary | ICD-10-CM | POA: Diagnosis not present

## 2012-03-31 DIAGNOSIS — Z992 Dependence on renal dialysis: Secondary | ICD-10-CM | POA: Diagnosis not present

## 2012-03-31 DIAGNOSIS — D631 Anemia in chronic kidney disease: Secondary | ICD-10-CM | POA: Diagnosis not present

## 2012-04-03 DIAGNOSIS — N2581 Secondary hyperparathyroidism of renal origin: Secondary | ICD-10-CM | POA: Diagnosis not present

## 2012-04-03 DIAGNOSIS — Z992 Dependence on renal dialysis: Secondary | ICD-10-CM | POA: Diagnosis not present

## 2012-04-03 DIAGNOSIS — D509 Iron deficiency anemia, unspecified: Secondary | ICD-10-CM | POA: Diagnosis not present

## 2012-04-03 DIAGNOSIS — N186 End stage renal disease: Secondary | ICD-10-CM | POA: Diagnosis not present

## 2012-04-03 DIAGNOSIS — D631 Anemia in chronic kidney disease: Secondary | ICD-10-CM | POA: Diagnosis not present

## 2012-04-05 DIAGNOSIS — D631 Anemia in chronic kidney disease: Secondary | ICD-10-CM | POA: Diagnosis not present

## 2012-04-05 DIAGNOSIS — D509 Iron deficiency anemia, unspecified: Secondary | ICD-10-CM | POA: Diagnosis not present

## 2012-04-05 DIAGNOSIS — N186 End stage renal disease: Secondary | ICD-10-CM | POA: Diagnosis not present

## 2012-04-05 DIAGNOSIS — Z992 Dependence on renal dialysis: Secondary | ICD-10-CM | POA: Diagnosis not present

## 2012-04-05 DIAGNOSIS — N2581 Secondary hyperparathyroidism of renal origin: Secondary | ICD-10-CM | POA: Diagnosis not present

## 2012-04-07 DIAGNOSIS — D509 Iron deficiency anemia, unspecified: Secondary | ICD-10-CM | POA: Diagnosis not present

## 2012-04-07 DIAGNOSIS — N186 End stage renal disease: Secondary | ICD-10-CM | POA: Diagnosis not present

## 2012-04-07 DIAGNOSIS — N039 Chronic nephritic syndrome with unspecified morphologic changes: Secondary | ICD-10-CM | POA: Diagnosis not present

## 2012-04-07 DIAGNOSIS — Z992 Dependence on renal dialysis: Secondary | ICD-10-CM | POA: Diagnosis not present

## 2012-04-07 DIAGNOSIS — N2581 Secondary hyperparathyroidism of renal origin: Secondary | ICD-10-CM | POA: Diagnosis not present

## 2012-04-10 DIAGNOSIS — Z992 Dependence on renal dialysis: Secondary | ICD-10-CM | POA: Diagnosis not present

## 2012-04-10 DIAGNOSIS — N2581 Secondary hyperparathyroidism of renal origin: Secondary | ICD-10-CM | POA: Diagnosis not present

## 2012-04-10 DIAGNOSIS — N186 End stage renal disease: Secondary | ICD-10-CM | POA: Diagnosis not present

## 2012-04-10 DIAGNOSIS — N039 Chronic nephritic syndrome with unspecified morphologic changes: Secondary | ICD-10-CM | POA: Diagnosis not present

## 2012-04-10 DIAGNOSIS — D509 Iron deficiency anemia, unspecified: Secondary | ICD-10-CM | POA: Diagnosis not present

## 2012-04-12 DIAGNOSIS — Z992 Dependence on renal dialysis: Secondary | ICD-10-CM | POA: Diagnosis not present

## 2012-04-12 DIAGNOSIS — N039 Chronic nephritic syndrome with unspecified morphologic changes: Secondary | ICD-10-CM | POA: Diagnosis not present

## 2012-04-12 DIAGNOSIS — N2581 Secondary hyperparathyroidism of renal origin: Secondary | ICD-10-CM | POA: Diagnosis not present

## 2012-04-12 DIAGNOSIS — D509 Iron deficiency anemia, unspecified: Secondary | ICD-10-CM | POA: Diagnosis not present

## 2012-04-12 DIAGNOSIS — N186 End stage renal disease: Secondary | ICD-10-CM | POA: Diagnosis not present

## 2012-04-14 DIAGNOSIS — Z992 Dependence on renal dialysis: Secondary | ICD-10-CM | POA: Diagnosis not present

## 2012-04-14 DIAGNOSIS — N186 End stage renal disease: Secondary | ICD-10-CM | POA: Diagnosis not present

## 2012-04-14 DIAGNOSIS — N2581 Secondary hyperparathyroidism of renal origin: Secondary | ICD-10-CM | POA: Diagnosis not present

## 2012-04-14 DIAGNOSIS — D631 Anemia in chronic kidney disease: Secondary | ICD-10-CM | POA: Diagnosis not present

## 2012-04-14 DIAGNOSIS — D509 Iron deficiency anemia, unspecified: Secondary | ICD-10-CM | POA: Diagnosis not present

## 2012-04-14 DIAGNOSIS — N039 Chronic nephritic syndrome with unspecified morphologic changes: Secondary | ICD-10-CM | POA: Diagnosis not present

## 2012-04-17 DIAGNOSIS — N2581 Secondary hyperparathyroidism of renal origin: Secondary | ICD-10-CM | POA: Diagnosis not present

## 2012-04-17 DIAGNOSIS — N039 Chronic nephritic syndrome with unspecified morphologic changes: Secondary | ICD-10-CM | POA: Diagnosis not present

## 2012-04-17 DIAGNOSIS — D509 Iron deficiency anemia, unspecified: Secondary | ICD-10-CM | POA: Diagnosis not present

## 2012-04-17 DIAGNOSIS — Z992 Dependence on renal dialysis: Secondary | ICD-10-CM | POA: Diagnosis not present

## 2012-04-17 DIAGNOSIS — N186 End stage renal disease: Secondary | ICD-10-CM | POA: Diagnosis not present

## 2012-04-19 DIAGNOSIS — N2581 Secondary hyperparathyroidism of renal origin: Secondary | ICD-10-CM | POA: Diagnosis not present

## 2012-04-19 DIAGNOSIS — D509 Iron deficiency anemia, unspecified: Secondary | ICD-10-CM | POA: Diagnosis not present

## 2012-04-19 DIAGNOSIS — N186 End stage renal disease: Secondary | ICD-10-CM | POA: Diagnosis not present

## 2012-04-19 DIAGNOSIS — D631 Anemia in chronic kidney disease: Secondary | ICD-10-CM | POA: Diagnosis not present

## 2012-04-19 DIAGNOSIS — Z992 Dependence on renal dialysis: Secondary | ICD-10-CM | POA: Diagnosis not present

## 2012-04-21 DIAGNOSIS — D631 Anemia in chronic kidney disease: Secondary | ICD-10-CM | POA: Diagnosis not present

## 2012-04-21 DIAGNOSIS — N2581 Secondary hyperparathyroidism of renal origin: Secondary | ICD-10-CM | POA: Diagnosis not present

## 2012-04-21 DIAGNOSIS — D509 Iron deficiency anemia, unspecified: Secondary | ICD-10-CM | POA: Diagnosis not present

## 2012-04-21 DIAGNOSIS — Z992 Dependence on renal dialysis: Secondary | ICD-10-CM | POA: Diagnosis not present

## 2012-04-21 DIAGNOSIS — N186 End stage renal disease: Secondary | ICD-10-CM | POA: Diagnosis not present

## 2012-04-24 DIAGNOSIS — Z992 Dependence on renal dialysis: Secondary | ICD-10-CM | POA: Diagnosis not present

## 2012-04-24 DIAGNOSIS — N2581 Secondary hyperparathyroidism of renal origin: Secondary | ICD-10-CM | POA: Diagnosis not present

## 2012-04-24 DIAGNOSIS — D631 Anemia in chronic kidney disease: Secondary | ICD-10-CM | POA: Diagnosis not present

## 2012-04-24 DIAGNOSIS — D509 Iron deficiency anemia, unspecified: Secondary | ICD-10-CM | POA: Diagnosis not present

## 2012-04-24 DIAGNOSIS — N186 End stage renal disease: Secondary | ICD-10-CM | POA: Diagnosis not present

## 2012-04-25 DIAGNOSIS — H5210 Myopia, unspecified eye: Secondary | ICD-10-CM | POA: Diagnosis not present

## 2012-04-26 DIAGNOSIS — N2581 Secondary hyperparathyroidism of renal origin: Secondary | ICD-10-CM | POA: Diagnosis not present

## 2012-04-26 DIAGNOSIS — D509 Iron deficiency anemia, unspecified: Secondary | ICD-10-CM | POA: Diagnosis not present

## 2012-04-26 DIAGNOSIS — Z992 Dependence on renal dialysis: Secondary | ICD-10-CM | POA: Diagnosis not present

## 2012-04-26 DIAGNOSIS — E1129 Type 2 diabetes mellitus with other diabetic kidney complication: Secondary | ICD-10-CM | POA: Diagnosis not present

## 2012-04-26 DIAGNOSIS — N186 End stage renal disease: Secondary | ICD-10-CM | POA: Diagnosis not present

## 2012-04-26 DIAGNOSIS — N039 Chronic nephritic syndrome with unspecified morphologic changes: Secondary | ICD-10-CM | POA: Diagnosis not present

## 2012-04-28 DIAGNOSIS — N186 End stage renal disease: Secondary | ICD-10-CM | POA: Diagnosis not present

## 2012-04-28 DIAGNOSIS — D509 Iron deficiency anemia, unspecified: Secondary | ICD-10-CM | POA: Diagnosis not present

## 2012-04-28 DIAGNOSIS — N2581 Secondary hyperparathyroidism of renal origin: Secondary | ICD-10-CM | POA: Diagnosis not present

## 2012-04-28 DIAGNOSIS — Z992 Dependence on renal dialysis: Secondary | ICD-10-CM | POA: Diagnosis not present

## 2012-04-28 DIAGNOSIS — N039 Chronic nephritic syndrome with unspecified morphologic changes: Secondary | ICD-10-CM | POA: Diagnosis not present

## 2012-05-01 DIAGNOSIS — D509 Iron deficiency anemia, unspecified: Secondary | ICD-10-CM | POA: Diagnosis not present

## 2012-05-01 DIAGNOSIS — N186 End stage renal disease: Secondary | ICD-10-CM | POA: Diagnosis not present

## 2012-05-01 DIAGNOSIS — N2581 Secondary hyperparathyroidism of renal origin: Secondary | ICD-10-CM | POA: Diagnosis not present

## 2012-05-01 DIAGNOSIS — D631 Anemia in chronic kidney disease: Secondary | ICD-10-CM | POA: Diagnosis not present

## 2012-05-26 DIAGNOSIS — N186 End stage renal disease: Secondary | ICD-10-CM | POA: Diagnosis not present

## 2012-05-29 DIAGNOSIS — D509 Iron deficiency anemia, unspecified: Secondary | ICD-10-CM | POA: Diagnosis not present

## 2012-05-29 DIAGNOSIS — N2581 Secondary hyperparathyroidism of renal origin: Secondary | ICD-10-CM | POA: Diagnosis not present

## 2012-05-29 DIAGNOSIS — N039 Chronic nephritic syndrome with unspecified morphologic changes: Secondary | ICD-10-CM | POA: Diagnosis not present

## 2012-05-29 DIAGNOSIS — N186 End stage renal disease: Secondary | ICD-10-CM | POA: Diagnosis not present

## 2012-05-31 DIAGNOSIS — N186 End stage renal disease: Secondary | ICD-10-CM | POA: Diagnosis not present

## 2012-05-31 DIAGNOSIS — N039 Chronic nephritic syndrome with unspecified morphologic changes: Secondary | ICD-10-CM | POA: Diagnosis not present

## 2012-05-31 DIAGNOSIS — N2581 Secondary hyperparathyroidism of renal origin: Secondary | ICD-10-CM | POA: Diagnosis not present

## 2012-05-31 DIAGNOSIS — D509 Iron deficiency anemia, unspecified: Secondary | ICD-10-CM | POA: Diagnosis not present

## 2012-06-01 DIAGNOSIS — I359 Nonrheumatic aortic valve disorder, unspecified: Secondary | ICD-10-CM | POA: Diagnosis not present

## 2012-06-01 DIAGNOSIS — N19 Unspecified kidney failure: Secondary | ICD-10-CM | POA: Diagnosis not present

## 2012-06-01 DIAGNOSIS — D500 Iron deficiency anemia secondary to blood loss (chronic): Secondary | ICD-10-CM | POA: Diagnosis not present

## 2012-06-02 DIAGNOSIS — D631 Anemia in chronic kidney disease: Secondary | ICD-10-CM | POA: Diagnosis not present

## 2012-06-02 DIAGNOSIS — N186 End stage renal disease: Secondary | ICD-10-CM | POA: Diagnosis not present

## 2012-06-02 DIAGNOSIS — N2581 Secondary hyperparathyroidism of renal origin: Secondary | ICD-10-CM | POA: Diagnosis not present

## 2012-06-02 DIAGNOSIS — D509 Iron deficiency anemia, unspecified: Secondary | ICD-10-CM | POA: Diagnosis not present

## 2012-06-05 DIAGNOSIS — D509 Iron deficiency anemia, unspecified: Secondary | ICD-10-CM | POA: Diagnosis not present

## 2012-06-05 DIAGNOSIS — D631 Anemia in chronic kidney disease: Secondary | ICD-10-CM | POA: Diagnosis not present

## 2012-06-05 DIAGNOSIS — N2581 Secondary hyperparathyroidism of renal origin: Secondary | ICD-10-CM | POA: Diagnosis not present

## 2012-06-05 DIAGNOSIS — N186 End stage renal disease: Secondary | ICD-10-CM | POA: Diagnosis not present

## 2012-06-07 ENCOUNTER — Other Ambulatory Visit (HOSPITAL_COMMUNITY): Payer: Self-pay | Admitting: Cardiovascular Disease

## 2012-06-07 DIAGNOSIS — N186 End stage renal disease: Secondary | ICD-10-CM | POA: Diagnosis not present

## 2012-06-07 DIAGNOSIS — N2581 Secondary hyperparathyroidism of renal origin: Secondary | ICD-10-CM | POA: Diagnosis not present

## 2012-06-07 DIAGNOSIS — I350 Nonrheumatic aortic (valve) stenosis: Secondary | ICD-10-CM

## 2012-06-07 DIAGNOSIS — D631 Anemia in chronic kidney disease: Secondary | ICD-10-CM | POA: Diagnosis not present

## 2012-06-07 DIAGNOSIS — D509 Iron deficiency anemia, unspecified: Secondary | ICD-10-CM | POA: Diagnosis not present

## 2012-06-09 DIAGNOSIS — N186 End stage renal disease: Secondary | ICD-10-CM | POA: Diagnosis not present

## 2012-06-09 DIAGNOSIS — D631 Anemia in chronic kidney disease: Secondary | ICD-10-CM | POA: Diagnosis not present

## 2012-06-09 DIAGNOSIS — D509 Iron deficiency anemia, unspecified: Secondary | ICD-10-CM | POA: Diagnosis not present

## 2012-06-09 DIAGNOSIS — N2581 Secondary hyperparathyroidism of renal origin: Secondary | ICD-10-CM | POA: Diagnosis not present

## 2012-06-12 DIAGNOSIS — N2581 Secondary hyperparathyroidism of renal origin: Secondary | ICD-10-CM | POA: Diagnosis not present

## 2012-06-12 DIAGNOSIS — D509 Iron deficiency anemia, unspecified: Secondary | ICD-10-CM | POA: Diagnosis not present

## 2012-06-12 DIAGNOSIS — N039 Chronic nephritic syndrome with unspecified morphologic changes: Secondary | ICD-10-CM | POA: Diagnosis not present

## 2012-06-12 DIAGNOSIS — N186 End stage renal disease: Secondary | ICD-10-CM | POA: Diagnosis not present

## 2012-06-14 DIAGNOSIS — D631 Anemia in chronic kidney disease: Secondary | ICD-10-CM | POA: Diagnosis not present

## 2012-06-14 DIAGNOSIS — N2581 Secondary hyperparathyroidism of renal origin: Secondary | ICD-10-CM | POA: Diagnosis not present

## 2012-06-14 DIAGNOSIS — D509 Iron deficiency anemia, unspecified: Secondary | ICD-10-CM | POA: Diagnosis not present

## 2012-06-14 DIAGNOSIS — N186 End stage renal disease: Secondary | ICD-10-CM | POA: Diagnosis not present

## 2012-06-15 ENCOUNTER — Ambulatory Visit (HOSPITAL_COMMUNITY)
Admission: RE | Admit: 2012-06-15 | Discharge: 2012-06-15 | Disposition: A | Discharge status: Home / Self Care | Payer: Medicare Other | Source: Ambulatory Visit | Attending: Cardiovascular Disease | Admitting: Cardiovascular Disease

## 2012-06-15 DIAGNOSIS — I350 Nonrheumatic aortic (valve) stenosis: Secondary | ICD-10-CM

## 2012-06-15 DIAGNOSIS — I359 Nonrheumatic aortic valve disorder, unspecified: Secondary | ICD-10-CM | POA: Insufficient documentation

## 2012-06-15 DIAGNOSIS — I4891 Unspecified atrial fibrillation: Secondary | ICD-10-CM | POA: Diagnosis not present

## 2012-06-15 DIAGNOSIS — G459 Transient cerebral ischemic attack, unspecified: Secondary | ICD-10-CM | POA: Diagnosis not present

## 2012-06-15 HISTORY — DX: Nonrheumatic aortic (valve) stenosis: I35.0

## 2012-06-15 NOTE — Progress Notes (Signed)
[facility]   2D echo completed 06/15/2012.   Cindy Kevan Prouty, RDCS  

## 2012-06-16 DIAGNOSIS — N186 End stage renal disease: Secondary | ICD-10-CM | POA: Diagnosis not present

## 2012-06-16 DIAGNOSIS — N2581 Secondary hyperparathyroidism of renal origin: Secondary | ICD-10-CM | POA: Diagnosis not present

## 2012-06-16 DIAGNOSIS — D631 Anemia in chronic kidney disease: Secondary | ICD-10-CM | POA: Diagnosis not present

## 2012-06-16 DIAGNOSIS — D509 Iron deficiency anemia, unspecified: Secondary | ICD-10-CM | POA: Diagnosis not present

## 2012-06-19 DIAGNOSIS — D509 Iron deficiency anemia, unspecified: Secondary | ICD-10-CM | POA: Diagnosis not present

## 2012-06-19 DIAGNOSIS — D631 Anemia in chronic kidney disease: Secondary | ICD-10-CM | POA: Diagnosis not present

## 2012-06-19 DIAGNOSIS — N2581 Secondary hyperparathyroidism of renal origin: Secondary | ICD-10-CM | POA: Diagnosis not present

## 2012-06-19 DIAGNOSIS — N039 Chronic nephritic syndrome with unspecified morphologic changes: Secondary | ICD-10-CM | POA: Diagnosis not present

## 2012-06-19 DIAGNOSIS — N186 End stage renal disease: Secondary | ICD-10-CM | POA: Diagnosis not present

## 2012-06-21 DIAGNOSIS — D509 Iron deficiency anemia, unspecified: Secondary | ICD-10-CM | POA: Diagnosis not present

## 2012-06-21 DIAGNOSIS — N039 Chronic nephritic syndrome with unspecified morphologic changes: Secondary | ICD-10-CM | POA: Diagnosis not present

## 2012-06-21 DIAGNOSIS — N186 End stage renal disease: Secondary | ICD-10-CM | POA: Diagnosis not present

## 2012-06-21 DIAGNOSIS — N2581 Secondary hyperparathyroidism of renal origin: Secondary | ICD-10-CM | POA: Diagnosis not present

## 2012-06-23 DIAGNOSIS — D631 Anemia in chronic kidney disease: Secondary | ICD-10-CM | POA: Diagnosis not present

## 2012-06-23 DIAGNOSIS — N2581 Secondary hyperparathyroidism of renal origin: Secondary | ICD-10-CM | POA: Diagnosis not present

## 2012-06-23 DIAGNOSIS — N186 End stage renal disease: Secondary | ICD-10-CM | POA: Diagnosis not present

## 2012-06-23 DIAGNOSIS — D509 Iron deficiency anemia, unspecified: Secondary | ICD-10-CM | POA: Diagnosis not present

## 2012-06-26 ENCOUNTER — Encounter: Payer: Self-pay | Admitting: Cardiovascular Disease

## 2012-06-26 ENCOUNTER — Encounter: Payer: Self-pay | Admitting: Pharmacist Clinician (PhC)/ Clinical Pharmacy Specialist

## 2012-06-26 DIAGNOSIS — D509 Iron deficiency anemia, unspecified: Secondary | ICD-10-CM | POA: Diagnosis not present

## 2012-06-26 DIAGNOSIS — N2581 Secondary hyperparathyroidism of renal origin: Secondary | ICD-10-CM | POA: Diagnosis not present

## 2012-06-26 DIAGNOSIS — N186 End stage renal disease: Secondary | ICD-10-CM | POA: Diagnosis not present

## 2012-06-26 DIAGNOSIS — N039 Chronic nephritic syndrome with unspecified morphologic changes: Secondary | ICD-10-CM | POA: Diagnosis not present

## 2012-06-28 DIAGNOSIS — D631 Anemia in chronic kidney disease: Secondary | ICD-10-CM | POA: Diagnosis not present

## 2012-06-28 DIAGNOSIS — D509 Iron deficiency anemia, unspecified: Secondary | ICD-10-CM | POA: Diagnosis not present

## 2012-06-28 DIAGNOSIS — N186 End stage renal disease: Secondary | ICD-10-CM | POA: Diagnosis not present

## 2012-06-28 DIAGNOSIS — N2581 Secondary hyperparathyroidism of renal origin: Secondary | ICD-10-CM | POA: Diagnosis not present

## 2012-07-17 DIAGNOSIS — E7800 Pure hypercholesterolemia, unspecified: Secondary | ICD-10-CM | POA: Diagnosis not present

## 2012-07-17 DIAGNOSIS — R946 Abnormal results of thyroid function studies: Secondary | ICD-10-CM | POA: Diagnosis not present

## 2012-07-17 DIAGNOSIS — E789 Disorder of lipoprotein metabolism, unspecified: Secondary | ICD-10-CM | POA: Diagnosis not present

## 2012-07-17 DIAGNOSIS — D443 Neoplasm of uncertain behavior of pituitary gland: Secondary | ICD-10-CM | POA: Diagnosis not present

## 2012-07-17 DIAGNOSIS — D444 Neoplasm of uncertain behavior of craniopharyngeal duct: Secondary | ICD-10-CM | POA: Diagnosis not present

## 2012-07-19 DIAGNOSIS — E1129 Type 2 diabetes mellitus with other diabetic kidney complication: Secondary | ICD-10-CM | POA: Diagnosis not present

## 2012-07-25 DIAGNOSIS — I10 Essential (primary) hypertension: Secondary | ICD-10-CM | POA: Diagnosis not present

## 2012-07-25 DIAGNOSIS — D352 Benign neoplasm of pituitary gland: Secondary | ICD-10-CM | POA: Diagnosis not present

## 2012-07-25 DIAGNOSIS — D353 Benign neoplasm of craniopharyngeal duct: Secondary | ICD-10-CM | POA: Diagnosis not present

## 2012-07-25 DIAGNOSIS — E789 Disorder of lipoprotein metabolism, unspecified: Secondary | ICD-10-CM | POA: Diagnosis not present

## 2012-07-25 DIAGNOSIS — N19 Unspecified kidney failure: Secondary | ICD-10-CM | POA: Diagnosis not present

## 2012-07-26 DIAGNOSIS — N186 End stage renal disease: Secondary | ICD-10-CM | POA: Diagnosis not present

## 2012-07-28 DIAGNOSIS — D631 Anemia in chronic kidney disease: Secondary | ICD-10-CM | POA: Diagnosis not present

## 2012-07-28 DIAGNOSIS — N2581 Secondary hyperparathyroidism of renal origin: Secondary | ICD-10-CM | POA: Diagnosis not present

## 2012-07-28 DIAGNOSIS — N186 End stage renal disease: Secondary | ICD-10-CM | POA: Diagnosis not present

## 2012-08-17 DIAGNOSIS — I120 Hypertensive chronic kidney disease with stage 5 chronic kidney disease or end stage renal disease: Secondary | ICD-10-CM | POA: Diagnosis not present

## 2012-08-17 DIAGNOSIS — N186 End stage renal disease: Secondary | ICD-10-CM | POA: Diagnosis not present

## 2012-08-17 DIAGNOSIS — I6529 Occlusion and stenosis of unspecified carotid artery: Secondary | ICD-10-CM | POA: Diagnosis not present

## 2012-08-17 DIAGNOSIS — I6523 Occlusion and stenosis of bilateral carotid arteries: Secondary | ICD-10-CM | POA: Insufficient documentation

## 2012-08-26 DIAGNOSIS — N186 End stage renal disease: Secondary | ICD-10-CM | POA: Diagnosis not present

## 2012-08-28 DIAGNOSIS — B1910 Unspecified viral hepatitis B without hepatic coma: Secondary | ICD-10-CM | POA: Diagnosis not present

## 2012-08-28 DIAGNOSIS — N186 End stage renal disease: Secondary | ICD-10-CM | POA: Diagnosis not present

## 2012-08-28 DIAGNOSIS — N2581 Secondary hyperparathyroidism of renal origin: Secondary | ICD-10-CM | POA: Diagnosis not present

## 2012-08-28 DIAGNOSIS — D631 Anemia in chronic kidney disease: Secondary | ICD-10-CM | POA: Diagnosis not present

## 2012-08-30 DIAGNOSIS — N2581 Secondary hyperparathyroidism of renal origin: Secondary | ICD-10-CM | POA: Diagnosis not present

## 2012-08-30 DIAGNOSIS — B1910 Unspecified viral hepatitis B without hepatic coma: Secondary | ICD-10-CM | POA: Diagnosis not present

## 2012-08-30 DIAGNOSIS — N186 End stage renal disease: Secondary | ICD-10-CM | POA: Diagnosis not present

## 2012-08-30 DIAGNOSIS — D631 Anemia in chronic kidney disease: Secondary | ICD-10-CM | POA: Diagnosis not present

## 2012-09-01 DIAGNOSIS — N2581 Secondary hyperparathyroidism of renal origin: Secondary | ICD-10-CM | POA: Diagnosis not present

## 2012-09-01 DIAGNOSIS — N186 End stage renal disease: Secondary | ICD-10-CM | POA: Diagnosis not present

## 2012-09-01 DIAGNOSIS — N039 Chronic nephritic syndrome with unspecified morphologic changes: Secondary | ICD-10-CM | POA: Diagnosis not present

## 2012-09-01 DIAGNOSIS — B1910 Unspecified viral hepatitis B without hepatic coma: Secondary | ICD-10-CM | POA: Diagnosis not present

## 2012-09-04 DIAGNOSIS — D631 Anemia in chronic kidney disease: Secondary | ICD-10-CM | POA: Diagnosis not present

## 2012-09-04 DIAGNOSIS — B1910 Unspecified viral hepatitis B without hepatic coma: Secondary | ICD-10-CM | POA: Diagnosis not present

## 2012-09-04 DIAGNOSIS — N2581 Secondary hyperparathyroidism of renal origin: Secondary | ICD-10-CM | POA: Diagnosis not present

## 2012-09-04 DIAGNOSIS — N186 End stage renal disease: Secondary | ICD-10-CM | POA: Diagnosis not present

## 2012-09-06 DIAGNOSIS — N186 End stage renal disease: Secondary | ICD-10-CM | POA: Diagnosis not present

## 2012-09-06 DIAGNOSIS — N039 Chronic nephritic syndrome with unspecified morphologic changes: Secondary | ICD-10-CM | POA: Diagnosis not present

## 2012-09-06 DIAGNOSIS — B1910 Unspecified viral hepatitis B without hepatic coma: Secondary | ICD-10-CM | POA: Diagnosis not present

## 2012-09-06 DIAGNOSIS — N2581 Secondary hyperparathyroidism of renal origin: Secondary | ICD-10-CM | POA: Diagnosis not present

## 2012-09-08 DIAGNOSIS — N2581 Secondary hyperparathyroidism of renal origin: Secondary | ICD-10-CM | POA: Diagnosis not present

## 2012-09-08 DIAGNOSIS — N039 Chronic nephritic syndrome with unspecified morphologic changes: Secondary | ICD-10-CM | POA: Diagnosis not present

## 2012-09-08 DIAGNOSIS — B1910 Unspecified viral hepatitis B without hepatic coma: Secondary | ICD-10-CM | POA: Diagnosis not present

## 2012-09-08 DIAGNOSIS — N186 End stage renal disease: Secondary | ICD-10-CM | POA: Diagnosis not present

## 2012-09-11 DIAGNOSIS — N2581 Secondary hyperparathyroidism of renal origin: Secondary | ICD-10-CM | POA: Diagnosis not present

## 2012-09-11 DIAGNOSIS — B1910 Unspecified viral hepatitis B without hepatic coma: Secondary | ICD-10-CM | POA: Diagnosis not present

## 2012-09-11 DIAGNOSIS — N039 Chronic nephritic syndrome with unspecified morphologic changes: Secondary | ICD-10-CM | POA: Diagnosis not present

## 2012-09-11 DIAGNOSIS — N186 End stage renal disease: Secondary | ICD-10-CM | POA: Diagnosis not present

## 2012-09-13 DIAGNOSIS — N2581 Secondary hyperparathyroidism of renal origin: Secondary | ICD-10-CM | POA: Diagnosis not present

## 2012-09-13 DIAGNOSIS — N186 End stage renal disease: Secondary | ICD-10-CM | POA: Diagnosis not present

## 2012-09-13 DIAGNOSIS — B1910 Unspecified viral hepatitis B without hepatic coma: Secondary | ICD-10-CM | POA: Diagnosis not present

## 2012-09-13 DIAGNOSIS — N039 Chronic nephritic syndrome with unspecified morphologic changes: Secondary | ICD-10-CM | POA: Diagnosis not present

## 2012-09-15 DIAGNOSIS — N186 End stage renal disease: Secondary | ICD-10-CM | POA: Diagnosis not present

## 2012-09-15 DIAGNOSIS — N2581 Secondary hyperparathyroidism of renal origin: Secondary | ICD-10-CM | POA: Diagnosis not present

## 2012-09-15 DIAGNOSIS — D631 Anemia in chronic kidney disease: Secondary | ICD-10-CM | POA: Diagnosis not present

## 2012-09-15 DIAGNOSIS — B1910 Unspecified viral hepatitis B without hepatic coma: Secondary | ICD-10-CM | POA: Diagnosis not present

## 2012-09-18 DIAGNOSIS — D631 Anemia in chronic kidney disease: Secondary | ICD-10-CM | POA: Diagnosis not present

## 2012-09-18 DIAGNOSIS — N039 Chronic nephritic syndrome with unspecified morphologic changes: Secondary | ICD-10-CM | POA: Diagnosis not present

## 2012-09-18 DIAGNOSIS — N186 End stage renal disease: Secondary | ICD-10-CM | POA: Diagnosis not present

## 2012-09-20 DIAGNOSIS — N039 Chronic nephritic syndrome with unspecified morphologic changes: Secondary | ICD-10-CM | POA: Diagnosis not present

## 2012-09-20 DIAGNOSIS — N186 End stage renal disease: Secondary | ICD-10-CM | POA: Diagnosis not present

## 2012-09-22 DIAGNOSIS — N186 End stage renal disease: Secondary | ICD-10-CM | POA: Diagnosis not present

## 2012-09-22 DIAGNOSIS — D631 Anemia in chronic kidney disease: Secondary | ICD-10-CM | POA: Diagnosis not present

## 2012-09-25 DIAGNOSIS — N186 End stage renal disease: Secondary | ICD-10-CM | POA: Diagnosis not present

## 2012-09-25 DIAGNOSIS — N039 Chronic nephritic syndrome with unspecified morphologic changes: Secondary | ICD-10-CM | POA: Diagnosis not present

## 2012-09-25 DIAGNOSIS — N2581 Secondary hyperparathyroidism of renal origin: Secondary | ICD-10-CM | POA: Diagnosis not present

## 2012-09-25 DIAGNOSIS — B1910 Unspecified viral hepatitis B without hepatic coma: Secondary | ICD-10-CM | POA: Diagnosis not present

## 2012-09-27 DIAGNOSIS — Z23 Encounter for immunization: Secondary | ICD-10-CM | POA: Diagnosis not present

## 2012-09-27 DIAGNOSIS — D509 Iron deficiency anemia, unspecified: Secondary | ICD-10-CM | POA: Diagnosis not present

## 2012-09-27 DIAGNOSIS — D631 Anemia in chronic kidney disease: Secondary | ICD-10-CM | POA: Diagnosis not present

## 2012-09-27 DIAGNOSIS — N2581 Secondary hyperparathyroidism of renal origin: Secondary | ICD-10-CM | POA: Diagnosis not present

## 2012-09-27 DIAGNOSIS — N186 End stage renal disease: Secondary | ICD-10-CM | POA: Diagnosis not present

## 2012-10-18 DIAGNOSIS — E1129 Type 2 diabetes mellitus with other diabetic kidney complication: Secondary | ICD-10-CM | POA: Diagnosis not present

## 2012-10-26 DIAGNOSIS — N186 End stage renal disease: Secondary | ICD-10-CM | POA: Diagnosis not present

## 2012-10-27 DIAGNOSIS — N2581 Secondary hyperparathyroidism of renal origin: Secondary | ICD-10-CM | POA: Diagnosis not present

## 2012-10-27 DIAGNOSIS — D509 Iron deficiency anemia, unspecified: Secondary | ICD-10-CM | POA: Diagnosis not present

## 2012-10-27 DIAGNOSIS — D631 Anemia in chronic kidney disease: Secondary | ICD-10-CM | POA: Diagnosis not present

## 2012-10-27 DIAGNOSIS — N039 Chronic nephritic syndrome with unspecified morphologic changes: Secondary | ICD-10-CM | POA: Diagnosis not present

## 2012-10-27 DIAGNOSIS — N186 End stage renal disease: Secondary | ICD-10-CM | POA: Diagnosis not present

## 2012-10-30 DIAGNOSIS — D509 Iron deficiency anemia, unspecified: Secondary | ICD-10-CM | POA: Diagnosis not present

## 2012-10-30 DIAGNOSIS — D631 Anemia in chronic kidney disease: Secondary | ICD-10-CM | POA: Diagnosis not present

## 2012-10-30 DIAGNOSIS — N186 End stage renal disease: Secondary | ICD-10-CM | POA: Diagnosis not present

## 2012-10-30 DIAGNOSIS — N2581 Secondary hyperparathyroidism of renal origin: Secondary | ICD-10-CM | POA: Diagnosis not present

## 2012-11-01 DIAGNOSIS — N186 End stage renal disease: Secondary | ICD-10-CM | POA: Diagnosis not present

## 2012-11-01 DIAGNOSIS — D509 Iron deficiency anemia, unspecified: Secondary | ICD-10-CM | POA: Diagnosis not present

## 2012-11-01 DIAGNOSIS — N2581 Secondary hyperparathyroidism of renal origin: Secondary | ICD-10-CM | POA: Diagnosis not present

## 2012-11-01 DIAGNOSIS — D631 Anemia in chronic kidney disease: Secondary | ICD-10-CM | POA: Diagnosis not present

## 2012-11-01 DIAGNOSIS — N039 Chronic nephritic syndrome with unspecified morphologic changes: Secondary | ICD-10-CM | POA: Diagnosis not present

## 2012-11-02 ENCOUNTER — Other Ambulatory Visit: Payer: Self-pay | Admitting: *Deleted

## 2012-11-02 DIAGNOSIS — E782 Mixed hyperlipidemia: Secondary | ICD-10-CM | POA: Diagnosis not present

## 2012-11-02 DIAGNOSIS — R0989 Other specified symptoms and signs involving the circulatory and respiratory systems: Secondary | ICD-10-CM

## 2012-11-02 DIAGNOSIS — R011 Cardiac murmur, unspecified: Secondary | ICD-10-CM | POA: Diagnosis not present

## 2012-11-02 DIAGNOSIS — I359 Nonrheumatic aortic valve disorder, unspecified: Secondary | ICD-10-CM | POA: Diagnosis not present

## 2012-11-02 DIAGNOSIS — I718 Aortic aneurysm of unspecified site, ruptured: Secondary | ICD-10-CM

## 2012-11-03 ENCOUNTER — Telehealth: Payer: Self-pay | Admitting: Cardiovascular Disease

## 2012-11-03 DIAGNOSIS — N2581 Secondary hyperparathyroidism of renal origin: Secondary | ICD-10-CM | POA: Diagnosis not present

## 2012-11-03 DIAGNOSIS — D509 Iron deficiency anemia, unspecified: Secondary | ICD-10-CM | POA: Diagnosis not present

## 2012-11-03 DIAGNOSIS — N186 End stage renal disease: Secondary | ICD-10-CM | POA: Diagnosis not present

## 2012-11-03 DIAGNOSIS — D631 Anemia in chronic kidney disease: Secondary | ICD-10-CM | POA: Diagnosis not present

## 2012-11-03 NOTE — Telephone Encounter (Signed)
Message forwarded to J.C. Wildman, LPN.  

## 2012-11-03 NOTE — Telephone Encounter (Signed)
Please call-wants you to contact a doctor at [facility] had some test down there.

## 2012-11-06 DIAGNOSIS — N186 End stage renal disease: Secondary | ICD-10-CM | POA: Diagnosis not present

## 2012-11-06 DIAGNOSIS — N039 Chronic nephritic syndrome with unspecified morphologic changes: Secondary | ICD-10-CM | POA: Diagnosis not present

## 2012-11-06 DIAGNOSIS — D509 Iron deficiency anemia, unspecified: Secondary | ICD-10-CM | POA: Diagnosis not present

## 2012-11-06 DIAGNOSIS — N2581 Secondary hyperparathyroidism of renal origin: Secondary | ICD-10-CM | POA: Diagnosis not present

## 2012-11-08 DIAGNOSIS — N186 End stage renal disease: Secondary | ICD-10-CM | POA: Diagnosis not present

## 2012-11-08 DIAGNOSIS — N2581 Secondary hyperparathyroidism of renal origin: Secondary | ICD-10-CM | POA: Diagnosis not present

## 2012-11-08 DIAGNOSIS — D509 Iron deficiency anemia, unspecified: Secondary | ICD-10-CM | POA: Diagnosis not present

## 2012-11-08 DIAGNOSIS — N039 Chronic nephritic syndrome with unspecified morphologic changes: Secondary | ICD-10-CM | POA: Diagnosis not present

## 2012-11-10 DIAGNOSIS — N2581 Secondary hyperparathyroidism of renal origin: Secondary | ICD-10-CM | POA: Diagnosis not present

## 2012-11-10 DIAGNOSIS — N039 Chronic nephritic syndrome with unspecified morphologic changes: Secondary | ICD-10-CM | POA: Diagnosis not present

## 2012-11-10 DIAGNOSIS — N186 End stage renal disease: Secondary | ICD-10-CM | POA: Diagnosis not present

## 2012-11-10 DIAGNOSIS — D509 Iron deficiency anemia, unspecified: Secondary | ICD-10-CM | POA: Diagnosis not present

## 2012-11-13 DIAGNOSIS — N2581 Secondary hyperparathyroidism of renal origin: Secondary | ICD-10-CM | POA: Diagnosis not present

## 2012-11-13 DIAGNOSIS — N186 End stage renal disease: Secondary | ICD-10-CM | POA: Diagnosis not present

## 2012-11-13 DIAGNOSIS — D509 Iron deficiency anemia, unspecified: Secondary | ICD-10-CM | POA: Diagnosis not present

## 2012-11-13 DIAGNOSIS — D631 Anemia in chronic kidney disease: Secondary | ICD-10-CM | POA: Diagnosis not present

## 2012-11-15 DIAGNOSIS — N2581 Secondary hyperparathyroidism of renal origin: Secondary | ICD-10-CM | POA: Diagnosis not present

## 2012-11-15 DIAGNOSIS — N186 End stage renal disease: Secondary | ICD-10-CM | POA: Diagnosis not present

## 2012-11-15 DIAGNOSIS — D631 Anemia in chronic kidney disease: Secondary | ICD-10-CM | POA: Diagnosis not present

## 2012-11-15 DIAGNOSIS — D509 Iron deficiency anemia, unspecified: Secondary | ICD-10-CM | POA: Diagnosis not present

## 2012-11-16 ENCOUNTER — Encounter: Payer: Self-pay | Admitting: Gastroenterology

## 2012-11-16 ENCOUNTER — Ambulatory Visit (HOSPITAL_COMMUNITY)
Admission: RE | Admit: 2012-11-16 | Discharge: 2012-11-16 | Disposition: A | Discharge status: Home / Self Care | Payer: Medicare Other | Source: Ambulatory Visit | Attending: Internal Medicine | Admitting: Internal Medicine

## 2012-11-16 DIAGNOSIS — I718 Aortic aneurysm of unspecified site, ruptured: Secondary | ICD-10-CM | POA: Diagnosis not present

## 2012-11-16 NOTE — Progress Notes (Signed)
Abdominal Aortic Duplex Completed °Brianna L Mazza,RVT °

## 2012-11-17 ENCOUNTER — Telehealth: Payer: Self-pay | Admitting: Gastroenterology

## 2012-11-17 DIAGNOSIS — N186 End stage renal disease: Secondary | ICD-10-CM | POA: Diagnosis not present

## 2012-11-17 DIAGNOSIS — D631 Anemia in chronic kidney disease: Secondary | ICD-10-CM | POA: Diagnosis not present

## 2012-11-17 DIAGNOSIS — N2581 Secondary hyperparathyroidism of renal origin: Secondary | ICD-10-CM | POA: Diagnosis not present

## 2012-11-17 DIAGNOSIS — D509 Iron deficiency anemia, unspecified: Secondary | ICD-10-CM | POA: Diagnosis not present

## 2012-11-17 NOTE — Telephone Encounter (Signed)
See Recall Sheet Scanned

## 2012-11-20 DIAGNOSIS — N2581 Secondary hyperparathyroidism of renal origin: Secondary | ICD-10-CM | POA: Diagnosis not present

## 2012-11-20 DIAGNOSIS — N186 End stage renal disease: Secondary | ICD-10-CM | POA: Diagnosis not present

## 2012-11-20 DIAGNOSIS — D631 Anemia in chronic kidney disease: Secondary | ICD-10-CM | POA: Diagnosis not present

## 2012-11-20 DIAGNOSIS — D509 Iron deficiency anemia, unspecified: Secondary | ICD-10-CM | POA: Diagnosis not present

## 2012-11-22 DIAGNOSIS — N2581 Secondary hyperparathyroidism of renal origin: Secondary | ICD-10-CM | POA: Diagnosis not present

## 2012-11-22 DIAGNOSIS — D631 Anemia in chronic kidney disease: Secondary | ICD-10-CM | POA: Diagnosis not present

## 2012-11-22 DIAGNOSIS — N186 End stage renal disease: Secondary | ICD-10-CM | POA: Diagnosis not present

## 2012-11-22 DIAGNOSIS — D509 Iron deficiency anemia, unspecified: Secondary | ICD-10-CM | POA: Diagnosis not present

## 2012-11-24 DIAGNOSIS — D509 Iron deficiency anemia, unspecified: Secondary | ICD-10-CM | POA: Diagnosis not present

## 2012-11-24 DIAGNOSIS — N186 End stage renal disease: Secondary | ICD-10-CM | POA: Diagnosis not present

## 2012-11-24 DIAGNOSIS — N2581 Secondary hyperparathyroidism of renal origin: Secondary | ICD-10-CM | POA: Diagnosis not present

## 2012-11-24 DIAGNOSIS — D631 Anemia in chronic kidney disease: Secondary | ICD-10-CM | POA: Diagnosis not present

## 2012-11-26 DIAGNOSIS — N186 End stage renal disease: Secondary | ICD-10-CM | POA: Diagnosis not present

## 2012-11-27 DIAGNOSIS — R609 Edema, unspecified: Secondary | ICD-10-CM | POA: Diagnosis not present

## 2012-11-27 DIAGNOSIS — D509 Iron deficiency anemia, unspecified: Secondary | ICD-10-CM | POA: Diagnosis not present

## 2012-11-27 DIAGNOSIS — N186 End stage renal disease: Secondary | ICD-10-CM | POA: Diagnosis not present

## 2012-11-27 DIAGNOSIS — N2581 Secondary hyperparathyroidism of renal origin: Secondary | ICD-10-CM | POA: Diagnosis not present

## 2012-11-27 DIAGNOSIS — D631 Anemia in chronic kidney disease: Secondary | ICD-10-CM | POA: Diagnosis not present

## 2012-11-29 DIAGNOSIS — N2581 Secondary hyperparathyroidism of renal origin: Secondary | ICD-10-CM | POA: Diagnosis not present

## 2012-11-29 DIAGNOSIS — D631 Anemia in chronic kidney disease: Secondary | ICD-10-CM | POA: Diagnosis not present

## 2012-11-29 DIAGNOSIS — R609 Edema, unspecified: Secondary | ICD-10-CM | POA: Diagnosis not present

## 2012-11-29 DIAGNOSIS — D509 Iron deficiency anemia, unspecified: Secondary | ICD-10-CM | POA: Diagnosis not present

## 2012-11-29 DIAGNOSIS — N186 End stage renal disease: Secondary | ICD-10-CM | POA: Diagnosis not present

## 2012-12-01 DIAGNOSIS — D631 Anemia in chronic kidney disease: Secondary | ICD-10-CM | POA: Diagnosis not present

## 2012-12-01 DIAGNOSIS — N186 End stage renal disease: Secondary | ICD-10-CM | POA: Diagnosis not present

## 2012-12-01 DIAGNOSIS — N2581 Secondary hyperparathyroidism of renal origin: Secondary | ICD-10-CM | POA: Diagnosis not present

## 2012-12-01 DIAGNOSIS — R609 Edema, unspecified: Secondary | ICD-10-CM | POA: Diagnosis not present

## 2012-12-01 DIAGNOSIS — D509 Iron deficiency anemia, unspecified: Secondary | ICD-10-CM | POA: Diagnosis not present

## 2012-12-04 DIAGNOSIS — D631 Anemia in chronic kidney disease: Secondary | ICD-10-CM | POA: Diagnosis not present

## 2012-12-04 DIAGNOSIS — N186 End stage renal disease: Secondary | ICD-10-CM | POA: Diagnosis not present

## 2012-12-04 DIAGNOSIS — N2581 Secondary hyperparathyroidism of renal origin: Secondary | ICD-10-CM | POA: Diagnosis not present

## 2012-12-04 DIAGNOSIS — R609 Edema, unspecified: Secondary | ICD-10-CM | POA: Diagnosis not present

## 2012-12-04 DIAGNOSIS — D509 Iron deficiency anemia, unspecified: Secondary | ICD-10-CM | POA: Diagnosis not present

## 2012-12-05 ENCOUNTER — Ambulatory Visit (HOSPITAL_COMMUNITY)
Admission: RE | Admit: 2012-12-05 | Discharge: 2012-12-05 | Disposition: A | Discharge status: Home / Self Care | Payer: Medicare Other | Source: Ambulatory Visit | Attending: Cardiovascular Disease | Admitting: Cardiovascular Disease

## 2012-12-05 DIAGNOSIS — R0989 Other specified symptoms and signs involving the circulatory and respiratory systems: Secondary | ICD-10-CM

## 2012-12-05 NOTE — Progress Notes (Signed)
Carotid Duplex Completed. An occluded left ICA was noted. - preliminary by tech. Marilynne Halsted, BS, RDMS, RVT

## 2012-12-06 DIAGNOSIS — R609 Edema, unspecified: Secondary | ICD-10-CM | POA: Diagnosis not present

## 2012-12-06 DIAGNOSIS — D631 Anemia in chronic kidney disease: Secondary | ICD-10-CM | POA: Diagnosis not present

## 2012-12-06 DIAGNOSIS — N2581 Secondary hyperparathyroidism of renal origin: Secondary | ICD-10-CM | POA: Diagnosis not present

## 2012-12-06 DIAGNOSIS — D509 Iron deficiency anemia, unspecified: Secondary | ICD-10-CM | POA: Diagnosis not present

## 2012-12-06 DIAGNOSIS — N186 End stage renal disease: Secondary | ICD-10-CM | POA: Diagnosis not present

## 2012-12-08 ENCOUNTER — Encounter (HOSPITAL_COMMUNITY): Payer: Self-pay | Admitting: Emergency Medicine

## 2012-12-08 ENCOUNTER — Emergency Department (HOSPITAL_COMMUNITY)
Admission: EM | Admit: 2012-12-08 | Discharge: 2012-12-08 | Disposition: A | Discharge status: Home / Self Care | Payer: Medicare Other | Attending: Emergency Medicine | Admitting: Emergency Medicine

## 2012-12-08 DIAGNOSIS — F329 Major depressive disorder, single episode, unspecified: Secondary | ICD-10-CM | POA: Insufficient documentation

## 2012-12-08 DIAGNOSIS — Z79899 Other long term (current) drug therapy: Secondary | ICD-10-CM | POA: Insufficient documentation

## 2012-12-08 DIAGNOSIS — Z860100 Personal history of colon polyps, unspecified: Secondary | ICD-10-CM | POA: Insufficient documentation

## 2012-12-08 DIAGNOSIS — Z992 Dependence on renal dialysis: Secondary | ICD-10-CM | POA: Insufficient documentation

## 2012-12-08 DIAGNOSIS — N186 End stage renal disease: Secondary | ICD-10-CM | POA: Insufficient documentation

## 2012-12-08 DIAGNOSIS — Z8711 Personal history of peptic ulcer disease: Secondary | ICD-10-CM | POA: Insufficient documentation

## 2012-12-08 DIAGNOSIS — R079 Chest pain, unspecified: Secondary | ICD-10-CM | POA: Diagnosis not present

## 2012-12-08 DIAGNOSIS — E119 Type 2 diabetes mellitus without complications: Secondary | ICD-10-CM | POA: Diagnosis not present

## 2012-12-08 DIAGNOSIS — Z87891 Personal history of nicotine dependence: Secondary | ICD-10-CM | POA: Diagnosis not present

## 2012-12-08 DIAGNOSIS — I350 Nonrheumatic aortic (valve) stenosis: Secondary | ICD-10-CM

## 2012-12-08 DIAGNOSIS — Z87442 Personal history of urinary calculi: Secondary | ICD-10-CM | POA: Insufficient documentation

## 2012-12-08 DIAGNOSIS — Z880 Allergy status to penicillin: Secondary | ICD-10-CM | POA: Diagnosis not present

## 2012-12-08 DIAGNOSIS — Z862 Personal history of diseases of the blood and blood-forming organs and certain disorders involving the immune mechanism: Secondary | ICD-10-CM | POA: Diagnosis not present

## 2012-12-08 DIAGNOSIS — R0789 Other chest pain: Secondary | ICD-10-CM | POA: Diagnosis not present

## 2012-12-08 DIAGNOSIS — I359 Nonrheumatic aortic valve disorder, unspecified: Secondary | ICD-10-CM | POA: Insufficient documentation

## 2012-12-08 DIAGNOSIS — I4891 Unspecified atrial fibrillation: Secondary | ICD-10-CM | POA: Insufficient documentation

## 2012-12-08 DIAGNOSIS — Z8669 Personal history of other diseases of the nervous system and sense organs: Secondary | ICD-10-CM | POA: Insufficient documentation

## 2012-12-08 DIAGNOSIS — R609 Edema, unspecified: Secondary | ICD-10-CM | POA: Diagnosis not present

## 2012-12-08 DIAGNOSIS — Z7982 Long term (current) use of aspirin: Secondary | ICD-10-CM | POA: Insufficient documentation

## 2012-12-08 DIAGNOSIS — F3289 Other specified depressive episodes: Secondary | ICD-10-CM | POA: Insufficient documentation

## 2012-12-08 DIAGNOSIS — Z8673 Personal history of transient ischemic attack (TIA), and cerebral infarction without residual deficits: Secondary | ICD-10-CM | POA: Diagnosis not present

## 2012-12-08 DIAGNOSIS — K219 Gastro-esophageal reflux disease without esophagitis: Secondary | ICD-10-CM | POA: Diagnosis not present

## 2012-12-08 DIAGNOSIS — D631 Anemia in chronic kidney disease: Secondary | ICD-10-CM | POA: Diagnosis not present

## 2012-12-08 DIAGNOSIS — R011 Cardiac murmur, unspecified: Secondary | ICD-10-CM | POA: Diagnosis not present

## 2012-12-08 DIAGNOSIS — I480 Paroxysmal atrial fibrillation: Secondary | ICD-10-CM

## 2012-12-08 DIAGNOSIS — E785 Hyperlipidemia, unspecified: Secondary | ICD-10-CM | POA: Diagnosis not present

## 2012-12-08 DIAGNOSIS — R109 Unspecified abdominal pain: Secondary | ICD-10-CM | POA: Insufficient documentation

## 2012-12-08 DIAGNOSIS — I120 Hypertensive chronic kidney disease with stage 5 chronic kidney disease or end stage renal disease: Secondary | ICD-10-CM | POA: Insufficient documentation

## 2012-12-08 DIAGNOSIS — D509 Iron deficiency anemia, unspecified: Secondary | ICD-10-CM | POA: Diagnosis not present

## 2012-12-08 DIAGNOSIS — N2581 Secondary hyperparathyroidism of renal origin: Secondary | ICD-10-CM | POA: Diagnosis not present

## 2012-12-08 LAB — CBC
Hemoglobin: 10.6 g/dL — ABNORMAL LOW (ref 13.0–17.0)
MCH: 32.4 pg (ref 26.0–34.0)
Platelets: 186 10*3/uL (ref 150–400)
RBC: 3.27 MIL/uL — ABNORMAL LOW (ref 4.22–5.81)
WBC: 9.6 10*3/uL (ref 4.0–10.5)

## 2012-12-08 LAB — BASIC METABOLIC PANEL
CO2: 28 mEq/L (ref 19–32)
Chloride: 101 mEq/L (ref 96–112)
Glucose, Bld: 103 mg/dL — ABNORMAL HIGH (ref 70–99)
Potassium: 3.4 mEq/L — ABNORMAL LOW (ref 3.5–5.1)
Sodium: 139 mEq/L (ref 135–145)

## 2012-12-08 LAB — POCT I-STAT TROPONIN I: Troponin i, poc: 0.01 ng/mL (ref 0.00–0.08)

## 2012-12-08 LAB — TROPONIN I: Troponin I: <0.3 ng/mL (ref <0.30)

## 2012-12-08 NOTE — Progress Notes (Signed)
Discontinue needle from AV shunt;no bleeding noted,pressure drsg applied.Educated pt.

## 2012-12-08 NOTE — H&P (Signed)
Pt. Seen and examined. Agree with the NP/PA-C note as written.  Pleasant 72 yo male with a-fib, moderate aortic stenosis by exam, ESRD on HD M,W,F. He notes today that his blood pressure was just above 100 systolic before dialysis. He noted during dialysis that he became diaphoretic and had a crampy pain develop across his upper abdomen. He was then given fluid back and the symptoms resolved before EMS arrived. He continues to be asymptomatic. He says this has happened before a few times and was related to "taking off too much fluid". He plans to discuss with his nephrologist. Initial POC troponin was 0.01. Second troponin is pending. He had a nuclear stress test last month which was negative.   Impression: 1. Possible abdominal cramping from dialysis versus hypotensive episode, in the setting of moderate AS  Plan: 1. Agree with checking a second troponin. It has been 5-6 hours. If negative, he could be safely discharged and follow-up with Dr. Alanda Amass.  Chrystie Nose, MD, [facility] Attending Cardiologist [facility]

## 2012-12-08 NOTE — ED Notes (Signed)
Troponin was completed at 1109

## 2012-12-08 NOTE — ED Provider Notes (Signed)
CSN: [id number]     Arrival date & time 12/08/12  2725 History   First MD Initiated Contact with Patient 12/08/12 [phone]     Chief Complaint  Patient presents with  . Chest Pain   (Consider location/radiation/quality/duration/timing/severity/associated sxs/prior Treatment) HPI Comments: Alexander [name] is a 72 y.o. male who developed chest pain, while being dialyzed this morning. The chest pain, improved spontaneously. He was taken off the dialysis before completion of the treatment. He was given an unknown amount of aspirin. He took his usual 81 mg aspirin at home, this morning. He ate this morning without problems. There has been no recurrence of the pain. He did not receive treatment, by EMS. He has not had chest pain like this in several months. He does not use nitroglycerin. He sees his cardiologist regularly and reports having a recent stress test that was normal. There been no recent illnesses. He denies fever, chills, nausea, vomiting, cough, shortness of breath, or back pain. There are no other known modifying factors.   Patient is a 72 y.o. male presenting with chest pain. The history is provided by the patient, the EMS personnel and a relative.  Chest Pain   Past Medical History  Diagnosis Date  . End stage renal disease     hemodialysis 3 times a week  . Seasonal allergies   . Hyperlipidemia   . Anemia   . Depression   . Diabetes mellitus     controlled by diet  . Macular degeneration     both eyes  . GERD (gastroesophageal reflux disease)   . Hypertension   . CVA (cerebral infarction)     2004  . Peptic ulcer     bleeding, 1969  . Kidney stones   . Renal insufficiency   . Diverticulosis   . Tubular adenoma of colon 07/2001  . Barrett's esophagus 05/2003  . Stroke 2004  . Colon polyps   . Renal failure   . Hemodialysis patient   . Aortic stenosis 06/15/12    TEE - EF 55-60%; grade 1 diastolic dysfunction; mild/mod aortic valve stenosis; Mitral valve had calcified  annulus, mild pulm htn PA peak pressure  . Atrial fibrillation 04/01/10    14 day event monitor - some sinus bradycardia, PACs and sinus tachycardia  . Atrial fibrillation 11/03/09    R/P MV - EF 66%; normal myocardial perfusion study, w/o chest pain or EKG changes for ischemia   Past Surgical History  Procedure Laterality Date  . Av fistula placement  2009  . Laminectomy  1969  . Tonsillectomy  1964  . 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 Cystoscopy      kidney stones  . Back surgery     Family History  Problem Relation Age of Onset  . Colon cancer Neg Hx   . Stomach cancer Mother   . Hypertension Father     Died of heart attack   History  Substance Use Topics  . Smoking status: Former Smoker    Quit date: 01/20/1998  . Smokeless tobacco: Never Used  . Alcohol Use: No    Review of Systems  Cardiovascular: Positive for chest pain.  All other systems reviewed and are negative.    Allergies  Penicillins  Home Medications   Current Outpatient Rx  Name  Route  Sig  Dispense  Refill  . acetaminophen (TYLENOL) 500 MG tablet   Oral   Take 1,000 mg by mouth every 6 (six)  hours as needed for pain.         [name] amiodarone (PACERONE) 200 MG tablet   Oral   Take 200 mg by mouth daily.          [name] aspirin 81 MG tablet   Oral   Take 81 mg by mouth daily.          [name] atorvastatin (LIPITOR) 20 MG tablet   Oral   Take 10 mg by mouth daily.         [name] b complex-vitamin c-folic acid (NEPHRO-VITE) 0.8 MG TABS   Oral   Take 0.8 mg by mouth daily.          . bromocriptine (PARLODEL) 5 MG capsule   Oral   Take 5 mg by mouth daily.          . calcium acetate (PHOSLO) 667 MG capsule   Oral   Take 1,334 mg by mouth 3 (three) times daily with meals.         . cinacalcet (SENSIPAR) 30 MG tablet   Oral   Take 30 mg by mouth daily.         [name] FLUoxetine (PROZAC) 20 MG capsule   Oral   Take 40 mg by mouth daily.          [name] levocetirizine (XYZAL) 5  MG tablet   Oral   Take 5 mg by mouth every evening.          [name] omeprazole (PRILOSEC) 20 MG capsule   Oral   Take 20 mg by mouth daily.            BP 142/48  Pulse 56  Temp(Src) 98 F (36.7 C) (Oral)  Resp 17  Ht 5' 10.5" (1.791 m)  Wt 190 lb (86.183 kg)  BMI 26.87 kg/m2  SpO2 98% Physical Exam  Nursing note and vitals reviewed. Constitutional: He is oriented to person, place, and time. He appears well-developed.  Elderly, appears older than stated age  HENT:  Head: Normocephalic and atraumatic.  Right Ear: External ear normal.  Left Ear: External ear normal.  Eyes: Conjunctivae and EOM are normal. Pupils are equal, round, and reactive to light.  Neck: Normal range of motion and phonation normal. Neck supple.  Cardiovascular: Normal rate, regular rhythm, normal heart sounds and intact distal pulses.   Pulmonary/Chest: Effort normal and breath sounds normal. No respiratory distress. He has no wheezes. He exhibits no tenderness and no bony tenderness.  Abdominal: Soft. Normal appearance. There is tenderness (mild lower quadrant tenderness, bilaterally, which he states is usual for him.).  Musculoskeletal: Normal range of motion.  Neurological: He is alert and oriented to person, place, and time. He has normal strength. No cranial nerve deficit or sensory deficit. He exhibits normal muscle tone. Coordination normal.  No dysarthria or aphasia.  Skin: Skin is warm, dry and intact.  Psychiatric: He has a normal mood and affect. His behavior is normal. Judgment and thought content normal.    ED Course  Procedures (including critical care time)  Patient Vitals for the past 24 hrs:  BP Temp Temp src Pulse Resp SpO2 Height Weight  12/08/12 1806 142/48 mmHg - - 56 17 98 % - -  12/08/12 1608 135/60 mmHg - - 57 18 100 % - -  12/08/12 1600 - - - 58 18 98 % - -  12/08/12 1545 - - - 60 18 97 % - -  12/08/12 1530 - - - 60 21 98 % - -  12/08/12 1515 - - - 60 20 96 % - -  12/08/12  1500 - - - 58 24 98 % - -  12/08/12 1445 - - - 59 22 97 % - -  12/08/12 1400 130/62 mmHg - - 62 20 97 % - -  12/08/12 1330 130/95 mmHg - - 63 18 97 % - -  12/08/12 1300 139/76 mmHg - - 63 17 100 % - -  12/08/12 1230 140/59 mmHg - - 65 19 94 % - -  12/08/12 1200 147/69 mmHg - - 68 19 93 % - -  12/08/12 1130 139/76 mmHg - - 67 17 98 % - -  12/08/12 1100 122/45 mmHg - - 63 30 97 % - -  12/08/12 1030 125/55 mmHg - - 64 20 99 % - -  12/08/12 0913 131/89 mmHg - - 65 24 100 % - -  12/08/12 0845 128/65 mmHg - - 61 17 100 % - -  12/08/12 0840 139/57 mmHg 98 F (36.7 C) Oral 63 20 100 % 5' 10.5" (1.791 m) 190 lb (86.183 kg)  12/08/12 0827 - - - - - 100 % - -     0900- chest pain free in the emergency department. He has not required treatment or intervention, at this time.  12:00 PM Reevaluation with update and discussion. After initial assessment and treatment, an updated evaluation reveals . He remains pain-free. Epsie Walthall L    Date: 12/08/12  Rate: 63  Rhythm: normal sinus rhythm  QRS Axis: normal  PR and QT Intervals: Borderline prolonged PR interval, prolonged QT  ST/T Wave abnormalities: normal  PR and QRS Conduction Disutrbances:first-degree A-V block   Narrative Interpretation:   Old EKG Reviewed: changes noted- PR interval is longer, since 06/01/2012   Labs Review Labs Reviewed  CBC - Abnormal; Notable for the following:    RBC 3.27 (*)    Hemoglobin 10.6 (*)    HCT 31.8 (*)    All other components within normal limits  BASIC METABOLIC PANEL - Abnormal; Notable for the following:    Potassium 3.4 (*)    Glucose, Bld 103 (*)    BUN 25 (*)    Creatinine, Ser 4.93 (*)    Calcium 8.1 (*)    GFR calc non Af Amer 11 (*)    GFR calc Af Amer 12 (*)    All other components within normal limits  TROPONIN I  POCT I-STAT TROPONIN I   Imaging Review No results found.  MDM   1. Chest pain   2. PAF (paroxysmal atrial fibrillation)   3. Moderate aortic stenosis     Nonspecific transient chest pain, improved spontaneously. The patient has been evaluated in the emergency department by cardiology, who feel he can be discharged, to follow up with his PCP, as there are no indicators for cardiac injury.   Nursing Notes Reviewed/ Care Coordinated, and agree without changes. Applicable Imaging Reviewed.  Interpretation of Laboratory Data incorporated into ED treatment   Plan: Home Medications- usual; Home Treatments and Observation- rest, watch for progressive symptoms; return here if the recommended treatment, does not improve the symptoms; Recommended follow up- follow up with cardiology in one week and PCP, as needed    Flint Melter, MD 12/08/12 1909

## 2012-12-08 NOTE — ED Notes (Signed)
Receieved pt from dialysis with c/o sudden onset of centered CP. Pt was about 1 1/2 hours into his dialysis when occurred. Pt given 1 81 mg ASA at dialysis.

## 2012-12-08 NOTE — H&P (Signed)
Alexander [name] is an 72 y.o. male.   Chief Complaint:  CP HPI:   The patient is a 72 YO male with history of atrial fibrillation on chronic amiodarone therapy, chronic renal failure with end-stage renal disease on hemodialysis Monday Wednesday Friday, diabetes mellitus, hyperlipidemia, TIA in 2004.  Patient's most recent recent nuclear stress test was August 2014 and was normal.  Quit smoking 14 years ago.  He was at hemodialysis today when he suddenly developed a 10 out of 10 chest pressure which was more in the epigastric region. The patient states that he felt like "they pumped to quick" at hemodialysis.  By the time EMS arrived at hemodialysis his pain had resolved in he has had none since. He reports some diaphoresis but otherwise denies nausea, vomiting, fever,  shortness of breath, orthopnea, dizziness, PND, cough, congestion, abdominal pain, hematochezia, melena, lower extremity edema, claudication.   Past Medical History  Diagnosis Date  . End stage renal disease     hemodialysis 3 times a week  . Seasonal allergies   . Hyperlipidemia   . Anemia   . Depression   . Diabetes mellitus     controlled by diet  . Macular degeneration     both eyes  . GERD (gastroesophageal reflux disease)   . Hypertension   . CVA (cerebral infarction)     2004  . Peptic ulcer     bleeding, 1969  . Kidney stones   . Renal insufficiency   . Diverticulosis   . Tubular adenoma of colon 07/2001  . Barrett's esophagus 05/2003  . Stroke 2004  . Colon polyps   . Renal failure   . Hemodialysis patient   . Aortic stenosis 06/15/12    TEE - EF 55-60%; grade 1 diastolic dysfunction; mild/mod aortic valve stenosis; Mitral valve had calcified annulus, mild pulm htn PA peak pressure  . Atrial fibrillation 04/01/10    14 day event monitor - some sinus bradycardia, PACs and sinus tachycardia  . Atrial fibrillation 11/03/09    R/P MV - EF 66%; normal myocardial perfusion study, w/o chest pain or EKG changes  for ischemia    Past Surgical History  Procedure Laterality Date  . Av fistula placement  2009  . Laminectomy  1969  . Tonsillectomy  1964  . 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 Cystoscopy      kidney stones  . Back surgery      Family History  Problem Relation Age of Onset  . Colon cancer Neg Hx   . Stomach cancer Mother   . Hypertension Father     Died of heart attack   Social History:  reports that he quit smoking about 14 years ago. He has never used smokeless tobacco. He reports that he does not drink alcohol or use illicit drugs.  Allergies:  Allergies  Allergen Reactions  . Penicillins Swelling     (Not in a hospital admission)  Results for orders placed during the hospital encounter of 12/08/12 (from the past 48 hour(s))  CBC     Status: Abnormal   Collection Time    12/08/12  8:43 AM      Result Value Range   WBC 9.6  4.0 - 10.5 K/uL   RBC 3.27 (*) 4.22 - 5.81 MIL/uL   Hemoglobin 10.6 (*) 13.0 - 17.0 g/dL   HCT 40.9 (*) 81.1 - 91.4 %   MCV 97.2  78.0 - 100.0 fL  MCH 32.4  26.0 - 34.0 pg   MCHC 33.3  30.0 - 36.0 g/dL   RDW 16.1  09.6 - 04.5 %   Platelets 186  150 - 400 K/uL  BASIC METABOLIC PANEL     Status: Abnormal   Collection Time    12/08/12  8:43 AM      Result Value Range   Sodium 139  135 - 145 mEq/L   Potassium 3.4 (*) 3.5 - 5.1 mEq/L   Chloride 101  96 - 112 mEq/L   CO2 28  19 - 32 mEq/L   Glucose, Bld 103 (*) 70 - 99 mg/dL   BUN 25 (*) 6 - 23 mg/dL   Creatinine, Ser 4.09 (*) 0.50 - 1.35 mg/dL   Calcium 8.1 (*) 8.4 - 10.5 mg/dL   GFR calc non Af Amer 11 (*) >90 mL/min   GFR calc Af Amer 12 (*) >90 mL/min   Comment: (NOTE)     The eGFR has been calculated using the CKD EPI equation.     This calculation has not been validated in all clinical situations.     eGFR's persistently <90 mL/min signify possible Chronic Kidney     Disease.  POCT I-STAT TROPONIN I     Status: None   Collection Time    12/08/12 11:00 AM      Result  Value Range   Troponin i, poc 0.01  0.00 - 0.08 ng/mL   Comment 3            Comment: Due to the release kinetics of cTnI,     a negative result within the first hours     of the onset of symptoms does not rule out     myocardial infarction with certainty.     If myocardial infarction is still suspected,     repeat the test at appropriate intervals.   No results found.  Review of Systems  Constitutional: Negative for fever.  HENT: Negative for congestion.   Respiratory: Negative for cough and shortness of breath.   Cardiovascular: Positive for chest pain. Negative for orthopnea, leg swelling and PND.  Gastrointestinal: Positive for abdominal pain (He describes this at a "tightness" which he has had for ten years.). Negative for nausea, vomiting and blood in stool.  Genitourinary: Negative for hematuria.  Musculoskeletal: Negative for myalgias.  Neurological: Negative for dizziness.  All other systems reviewed and are negative.    Blood pressure 140/59, pulse 65, temperature 98 F (36.7 C), temperature source Oral, resp. rate 19, height 5' 10.5" (1.791 m), weight 190 lb (86.183 kg), SpO2 94.00%. Physical Exam  Constitutional: He is oriented to person, place, and time. He appears well-developed and well-nourished. No distress.  HENT:  Head: Normocephalic and atraumatic.  Mouth/Throat: Oropharynx is clear and moist. No oropharyngeal exudate.  Eyes: EOM are normal. Pupils are equal, round, and reactive to light. No scleral icterus.  Neck: Normal range of motion. Neck supple. No JVD present.  Cardiovascular: Normal rate, regular rhythm, S1 normal and S2 normal.   Murmur heard.  Systolic murmur is present with a grade of 2/6  Pulses:      Radial pulses are 2+ on the right side, and 2+ on the left side.       Dorsalis pedis pulses are 2+ on the right side, and 2+ on the left side.  Respiratory: Effort normal and breath sounds normal. He has no wheezes. He has no rales.  GI: Soft.  Bowel sounds are  normal. He exhibits no distension. There is no tenderness.  Musculoskeletal: He exhibits no edema.  Neurological: He is alert and oriented to person, place, and time. He exhibits normal muscle tone.  Skin: Skin is warm and dry.  Psychiatric: He has a normal mood and affect.     Assessment/Plan Active Problems:   Chest pain   Hx of PAF  Plan:  Initial troponin is negative. No acute EKG changes.  Patient had a negative nuclear stress test one month ago. It is conceivable that his pain was related to demand ischemia from hypotension while at HD however, I do not see where he may have been hypotensive.  He does not report any dizziness either.  I would repeat the troponin at 1430 and if negative, DC home.    MD opinion to follow.    Laine Giovanetti 12/08/2012, 1:11 PM

## 2012-12-08 NOTE — ED Notes (Signed)
Cardiology at bedside.

## 2012-12-09 DIAGNOSIS — D509 Iron deficiency anemia, unspecified: Secondary | ICD-10-CM | POA: Diagnosis not present

## 2012-12-09 DIAGNOSIS — R609 Edema, unspecified: Secondary | ICD-10-CM | POA: Diagnosis not present

## 2012-12-09 DIAGNOSIS — D631 Anemia in chronic kidney disease: Secondary | ICD-10-CM | POA: Diagnosis not present

## 2012-12-09 DIAGNOSIS — N186 End stage renal disease: Secondary | ICD-10-CM | POA: Diagnosis not present

## 2012-12-09 DIAGNOSIS — N2581 Secondary hyperparathyroidism of renal origin: Secondary | ICD-10-CM | POA: Diagnosis not present

## 2012-12-11 DIAGNOSIS — R609 Edema, unspecified: Secondary | ICD-10-CM | POA: Diagnosis not present

## 2012-12-11 DIAGNOSIS — D631 Anemia in chronic kidney disease: Secondary | ICD-10-CM | POA: Diagnosis not present

## 2012-12-11 DIAGNOSIS — D509 Iron deficiency anemia, unspecified: Secondary | ICD-10-CM | POA: Diagnosis not present

## 2012-12-11 DIAGNOSIS — N2581 Secondary hyperparathyroidism of renal origin: Secondary | ICD-10-CM | POA: Diagnosis not present

## 2012-12-11 DIAGNOSIS — N186 End stage renal disease: Secondary | ICD-10-CM | POA: Diagnosis not present

## 2012-12-12 ENCOUNTER — Encounter: Payer: Self-pay | Admitting: Cardiology

## 2012-12-12 ENCOUNTER — Ambulatory Visit (INDEPENDENT_AMBULATORY_CARE_PROVIDER_SITE_OTHER): Payer: Medicare Other | Admitting: Cardiology

## 2012-12-12 VITALS — BP 128/68 | HR 56 | Ht 70.5 in | Wt 197.1 lb

## 2012-12-12 DIAGNOSIS — I480 Paroxysmal atrial fibrillation: Secondary | ICD-10-CM

## 2012-12-12 DIAGNOSIS — I359 Nonrheumatic aortic valve disorder, unspecified: Secondary | ICD-10-CM | POA: Diagnosis not present

## 2012-12-12 DIAGNOSIS — R079 Chest pain, unspecified: Secondary | ICD-10-CM

## 2012-12-12 DIAGNOSIS — I4891 Unspecified atrial fibrillation: Secondary | ICD-10-CM

## 2012-12-12 DIAGNOSIS — I350 Nonrheumatic aortic (valve) stenosis: Secondary | ICD-10-CM

## 2012-12-12 DIAGNOSIS — I739 Peripheral vascular disease, unspecified: Secondary | ICD-10-CM | POA: Insufficient documentation

## 2012-12-12 NOTE — Assessment & Plan Note (Signed)
Rare episode, currently on no anticoagulation, no episodes recently

## 2012-12-12 NOTE — Progress Notes (Signed)
12/12/2012   PCP: Londell Moh, MD   Chief Complaint  Patient presents with  . Follow-up    post hosp  on Friday w/CP; no complaints; carotid doppler last week    Primary Cardiologist:Dr. Alanda Amass requested Dr. Rennis Golden    HPI:  72 year old white married male is here today after emergency room visit for chest pain.  He has a history of atrial fibrillation on chronic amiodarone therapy, chronic renal failure with end-stage renal disease on hemodialysis Monday Wednesday Friday, diabetes mellitus, hyperlipidemia, TIA in 2004. Patient's most recent recent nuclear stress test was August 2014 and was normal. Quit smoking 14 years ago. He was at hemodialysis 12/08/12 when he suddenly developed a 10 out of 10 chest pressure which was more in the epigastric region. The patient stated that he felt like "they pumped to quick" at hemodialysis. By the time EMS arrived at hemodialysis his pain had resolved and he has had none since. He reported some diaphoresis but otherwise denied nausea, vomiting, fever, shortness of breath, orthopnea, dizziness, PND, cough, congestion, abdominal pain, hematochezia, melena, lower extremity edema, claudication.  His troponin's were negative.  He was seen by Dr. Rennis Golden and felt stable for discharge. He is back today as stated.    Patient has been quite well since the emergency room visit he denies any further chest pain, no lightheadedness, no dizziness, no complaints at all.  We reviewed his carotid dopplers and his abd. Ultrasound.  We discussed Dr. Kandis Cocking retirement and the patient would prefer to followup with Dr. Rennis Golden at this point since he does know him now.   Allergies  Allergen Reactions  . Penicillins Swelling    Current Outpatient Prescriptions  Medication Sig Dispense Refill  . acetaminophen (TYLENOL) 500 MG tablet Take 1,000 mg by mouth every 6 (six) hours as needed for pain.      [name] amiodarone (PACERONE) 200 MG tablet Take 200 mg by  mouth daily.       [name] aspirin 81 MG tablet Take 81 mg by mouth daily.       [name] atorvastatin (LIPITOR) 20 MG tablet Take 10 mg by mouth daily.      [name] b complex-vitamin c-folic acid (NEPHRO-VITE) 0.8 MG TABS Take 0.8 mg by mouth daily.       . bromocriptine (PARLODEL) 5 MG capsule Take 5 mg by mouth daily.       . calcium acetate (PHOSLO) 667 MG capsule Take 1,334 mg by mouth 3 (three) times daily with meals.      . Cetirizine HCl 10 MG CAPS Take by mouth daily.      . cinacalcet (SENSIPAR) 30 MG tablet Take 30 mg by mouth daily.      [name] FLUoxetine (PROZAC) 20 MG capsule Take 40 mg by mouth daily.       [name] omeprazole (PRILOSEC) 20 MG capsule Take 20 mg by mouth daily.         Current Facility-Administered Medications  Medication Dose Route Frequency Provider Last Rate Last Dose  . dextrose 5 % solution   Intravenous Continuous Meryl Dare, MD        Past Medical History  Diagnosis Date  . End stage renal disease     hemodialysis 3 times a week  . Seasonal allergies   . Hyperlipidemia   . Anemia   . Depression   . Diabetes mellitus     controlled by diet  . Macular degeneration  both eyes  . GERD (gastroesophageal reflux disease)   . Hypertension   . CVA (cerebral infarction)     2004  . Peptic ulcer     bleeding, 1969  . Kidney stones   . Renal insufficiency   . Diverticulosis   . Tubular adenoma of colon 07/2001  . Barrett's esophagus 05/2003  . Stroke 2004  . Colon polyps   . Renal failure   . Hemodialysis patient   . Aortic stenosis 06/15/12    TEE - EF 55-60%; grade 1 diastolic dysfunction; mild/mod aortic valve stenosis; Mitral valve had calcified annulus, mild pulm htn PA peak pressure  . Atrial fibrillation 04/01/10    14 day event monitor - some sinus bradycardia, PACs and sinus tachycardia  . Atrial fibrillation 11/03/09    R/P MV - EF 66%; normal myocardial perfusion study, w/o chest pain or EKG changes for ischemia    Past Surgical History  Procedure  Laterality Date  . Av fistula placement  2009  . Laminectomy  1969  . Tonsillectomy  1964  . 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 Cystoscopy      kidney stones  . Back surgery      WUJ:WJXBJYN:WG colds or fevers, no weight changes Skin:no rashes or ulcers HEENT:no blurred vision, no congestion CV:see HPI PUL:see HPI GI:no diarrhea, constipation or melena, no indigestion GU:no hematuria, no dysuria MS:no joint pain, no claudication Neuro:no syncope, no lightheadedness Endo:+ diabetes, no thyroid disease  PHYSICAL EXAM BP 128/68  Pulse 56  Ht 5' 10.5" (1.791 m)  Wt 197 lb 1.6 oz (89.404 kg)  BMI 27.87 kg/m2 General:Pleasant affect, NAD Skin:Warm and dry, brisk capillary refill HEENT:normocephalic, sclera clear, mucus membranes moist Neck:supple, no JVD, + carotid bruits bil. Heart:S1S2 RRR without murmur, gallup, rub or click Lungs:clear without rales, rhonchi, or wheezes NFA:OZHY, non tender, + BS, do not palpate liver spleen or masses Ext:no lower ext edema, 2+ pedal pulses, 2+ radial pulses Neuro:alert and oriented, MAE, follows commands, + facial symmetry  EKG: Sinus bradycardia rate of 56 per screen the block PR 216 ms,no acute changes from previous tracings.  ASSESSMENT AND PLAN Chest pain Result no further episodes  Aortic stenosis Stable  PAF (paroxysmal atrial fibrillation) Rare episode, currently on no anticoagulation, no episodes recently  PAD (peripheral artery disease) Carotid dopplers with complete Lt ICA occl.  ? Old, rt ICA 50-69 % diameter reduction.  Lt subclavian 50-69% diameter reduction by velocity, ? Due to Lt arm dialysis shunt.   Currently stable patient will follow up with Dr. Rennis Golden in 3-4 months I will asked Dr. Alanda Amass his opinion of his carotid Dopplers. Patient will call if he has any further problems.

## 2012-12-12 NOTE — Assessment & Plan Note (Signed)
Stable

## 2012-12-12 NOTE — Patient Instructions (Addendum)
Your physician wants you to follow-up in: 3-4 months with Dr. Rennis Golden. You will receive a reminder letter in the mail two months in advance. If you don't receive a letter, please call our office to schedule the follow-up appointment.  Your abdominal aorta ultrasound was normal.  Your carotid doppler studies were stable, when compared to your last scan.

## 2012-12-12 NOTE — Assessment & Plan Note (Signed)
Carotid dopplers with complete Lt ICA occl.  ? Old, rt ICA 50-69 % diameter reduction.  Lt subclavian 50-69% diameter reduction by velocity, ? Due to Lt arm dialysis shunt.

## 2012-12-12 NOTE — Assessment & Plan Note (Signed)
Result no further episodes

## 2012-12-13 DIAGNOSIS — D631 Anemia in chronic kidney disease: Secondary | ICD-10-CM | POA: Diagnosis not present

## 2012-12-13 DIAGNOSIS — D509 Iron deficiency anemia, unspecified: Secondary | ICD-10-CM | POA: Diagnosis not present

## 2012-12-13 DIAGNOSIS — R609 Edema, unspecified: Secondary | ICD-10-CM | POA: Diagnosis not present

## 2012-12-13 DIAGNOSIS — N2581 Secondary hyperparathyroidism of renal origin: Secondary | ICD-10-CM | POA: Diagnosis not present

## 2012-12-13 DIAGNOSIS — N186 End stage renal disease: Secondary | ICD-10-CM | POA: Diagnosis not present

## 2012-12-15 DIAGNOSIS — N2581 Secondary hyperparathyroidism of renal origin: Secondary | ICD-10-CM | POA: Diagnosis not present

## 2012-12-15 DIAGNOSIS — D631 Anemia in chronic kidney disease: Secondary | ICD-10-CM | POA: Diagnosis not present

## 2012-12-15 DIAGNOSIS — N186 End stage renal disease: Secondary | ICD-10-CM | POA: Diagnosis not present

## 2012-12-15 DIAGNOSIS — R609 Edema, unspecified: Secondary | ICD-10-CM | POA: Diagnosis not present

## 2012-12-15 DIAGNOSIS — D509 Iron deficiency anemia, unspecified: Secondary | ICD-10-CM | POA: Diagnosis not present

## 2012-12-18 DIAGNOSIS — D631 Anemia in chronic kidney disease: Secondary | ICD-10-CM | POA: Diagnosis not present

## 2012-12-18 DIAGNOSIS — R609 Edema, unspecified: Secondary | ICD-10-CM | POA: Diagnosis not present

## 2012-12-18 DIAGNOSIS — D509 Iron deficiency anemia, unspecified: Secondary | ICD-10-CM | POA: Diagnosis not present

## 2012-12-18 DIAGNOSIS — N2581 Secondary hyperparathyroidism of renal origin: Secondary | ICD-10-CM | POA: Diagnosis not present

## 2012-12-18 DIAGNOSIS — N186 End stage renal disease: Secondary | ICD-10-CM | POA: Diagnosis not present

## 2012-12-20 DIAGNOSIS — D631 Anemia in chronic kidney disease: Secondary | ICD-10-CM | POA: Diagnosis not present

## 2012-12-20 DIAGNOSIS — N186 End stage renal disease: Secondary | ICD-10-CM | POA: Diagnosis not present

## 2012-12-20 DIAGNOSIS — R609 Edema, unspecified: Secondary | ICD-10-CM | POA: Diagnosis not present

## 2012-12-20 DIAGNOSIS — N2581 Secondary hyperparathyroidism of renal origin: Secondary | ICD-10-CM | POA: Diagnosis not present

## 2012-12-20 DIAGNOSIS — D509 Iron deficiency anemia, unspecified: Secondary | ICD-10-CM | POA: Diagnosis not present

## 2012-12-22 DIAGNOSIS — D509 Iron deficiency anemia, unspecified: Secondary | ICD-10-CM | POA: Diagnosis not present

## 2012-12-22 DIAGNOSIS — N2581 Secondary hyperparathyroidism of renal origin: Secondary | ICD-10-CM | POA: Diagnosis not present

## 2012-12-22 DIAGNOSIS — N186 End stage renal disease: Secondary | ICD-10-CM | POA: Diagnosis not present

## 2012-12-22 DIAGNOSIS — R609 Edema, unspecified: Secondary | ICD-10-CM | POA: Diagnosis not present

## 2012-12-22 DIAGNOSIS — D631 Anemia in chronic kidney disease: Secondary | ICD-10-CM | POA: Diagnosis not present

## 2012-12-25 DIAGNOSIS — D509 Iron deficiency anemia, unspecified: Secondary | ICD-10-CM | POA: Diagnosis not present

## 2012-12-25 DIAGNOSIS — N186 End stage renal disease: Secondary | ICD-10-CM | POA: Diagnosis not present

## 2012-12-25 DIAGNOSIS — R609 Edema, unspecified: Secondary | ICD-10-CM | POA: Diagnosis not present

## 2012-12-25 DIAGNOSIS — N2581 Secondary hyperparathyroidism of renal origin: Secondary | ICD-10-CM | POA: Diagnosis not present

## 2012-12-25 DIAGNOSIS — D631 Anemia in chronic kidney disease: Secondary | ICD-10-CM | POA: Diagnosis not present

## 2012-12-26 DIAGNOSIS — N186 End stage renal disease: Secondary | ICD-10-CM | POA: Diagnosis not present

## 2012-12-27 DIAGNOSIS — N2581 Secondary hyperparathyroidism of renal origin: Secondary | ICD-10-CM | POA: Diagnosis not present

## 2012-12-27 DIAGNOSIS — N186 End stage renal disease: Secondary | ICD-10-CM | POA: Diagnosis not present

## 2012-12-27 DIAGNOSIS — D509 Iron deficiency anemia, unspecified: Secondary | ICD-10-CM | POA: Diagnosis not present

## 2012-12-27 DIAGNOSIS — Z23 Encounter for immunization: Secondary | ICD-10-CM | POA: Diagnosis not present

## 2012-12-27 DIAGNOSIS — D631 Anemia in chronic kidney disease: Secondary | ICD-10-CM | POA: Diagnosis not present

## 2013-01-02 DIAGNOSIS — Z961 Presence of intraocular lens: Secondary | ICD-10-CM | POA: Diagnosis not present

## 2013-01-02 DIAGNOSIS — H40059 Ocular hypertension, unspecified eye: Secondary | ICD-10-CM | POA: Diagnosis not present

## 2013-01-02 DIAGNOSIS — Z79899 Other long term (current) drug therapy: Secondary | ICD-10-CM | POA: Diagnosis not present

## 2013-01-02 DIAGNOSIS — H43819 Vitreous degeneration, unspecified eye: Secondary | ICD-10-CM | POA: Diagnosis not present

## 2013-01-02 DIAGNOSIS — H353290 Exudative age-related macular degeneration, unspecified eye, stage unspecified: Secondary | ICD-10-CM | POA: Diagnosis not present

## 2013-01-08 ENCOUNTER — Telehealth: Payer: Self-pay | Admitting: Gastroenterology

## 2013-01-08 ENCOUNTER — Encounter: Payer: Self-pay | Admitting: Gastroenterology

## 2013-01-08 NOTE — Telephone Encounter (Signed)
Message copied by Arna Snipe on Mon Jan 08, 2013  9:00 AM ------      Message from: Jessee Avers      Created: Mon Jan 08, 2013  8:11 AM       Can you read this note from Dr. Russella Dar please. Can you please schedule him for a direct. He doesn't need to keep his appt.      ----- Message -----         From: Meryl Dare, MD         Sent: 01/06/2013   1:33 PM           To: Jessee Avers, CMA            HD patients can be direct scheduled. Direct is ok with me.             ----- Message -----         From: Jessee Avers, CMA         Sent: 01/05/2013   2:43 PM           To: Meryl Dare, MD            This patient is on your schedule for 01/22/13 to discuss Colon, on dialysis. We did send him a recall letter in August. He also needs a extended bowel prep. I don't see where this patient actually needs to come into the office but I wanted to ask you first. Do they need an appt or can they be a direct?             ------

## 2013-01-17 DIAGNOSIS — E1129 Type 2 diabetes mellitus with other diabetic kidney complication: Secondary | ICD-10-CM | POA: Diagnosis not present

## 2013-01-18 DIAGNOSIS — E7800 Pure hypercholesterolemia, unspecified: Secondary | ICD-10-CM | POA: Diagnosis not present

## 2013-01-18 DIAGNOSIS — E119 Type 2 diabetes mellitus without complications: Secondary | ICD-10-CM | POA: Diagnosis not present

## 2013-01-18 DIAGNOSIS — Z125 Encounter for screening for malignant neoplasm of prostate: Secondary | ICD-10-CM | POA: Diagnosis not present

## 2013-01-18 DIAGNOSIS — I10 Essential (primary) hypertension: Secondary | ICD-10-CM | POA: Diagnosis not present

## 2013-01-18 DIAGNOSIS — D443 Neoplasm of uncertain behavior of pituitary gland: Secondary | ICD-10-CM | POA: Diagnosis not present

## 2013-01-22 ENCOUNTER — Ambulatory Visit: Payer: Medicare Other | Admitting: Gastroenterology

## 2013-01-25 DIAGNOSIS — Z23 Encounter for immunization: Secondary | ICD-10-CM | POA: Diagnosis not present

## 2013-01-25 DIAGNOSIS — N189 Chronic kidney disease, unspecified: Secondary | ICD-10-CM | POA: Diagnosis not present

## 2013-01-25 DIAGNOSIS — E789 Disorder of lipoprotein metabolism, unspecified: Secondary | ICD-10-CM | POA: Diagnosis not present

## 2013-01-25 DIAGNOSIS — D352 Benign neoplasm of pituitary gland: Secondary | ICD-10-CM | POA: Diagnosis not present

## 2013-01-26 DIAGNOSIS — N186 End stage renal disease: Secondary | ICD-10-CM | POA: Diagnosis not present

## 2013-01-29 DIAGNOSIS — D509 Iron deficiency anemia, unspecified: Secondary | ICD-10-CM | POA: Diagnosis not present

## 2013-01-29 DIAGNOSIS — N2581 Secondary hyperparathyroidism of renal origin: Secondary | ICD-10-CM | POA: Diagnosis not present

## 2013-01-29 DIAGNOSIS — N186 End stage renal disease: Secondary | ICD-10-CM | POA: Diagnosis not present

## 2013-01-29 DIAGNOSIS — D631 Anemia in chronic kidney disease: Secondary | ICD-10-CM | POA: Diagnosis not present

## 2013-01-31 DIAGNOSIS — N2581 Secondary hyperparathyroidism of renal origin: Secondary | ICD-10-CM | POA: Diagnosis not present

## 2013-01-31 DIAGNOSIS — D631 Anemia in chronic kidney disease: Secondary | ICD-10-CM | POA: Diagnosis not present

## 2013-01-31 DIAGNOSIS — D509 Iron deficiency anemia, unspecified: Secondary | ICD-10-CM | POA: Diagnosis not present

## 2013-01-31 DIAGNOSIS — N186 End stage renal disease: Secondary | ICD-10-CM | POA: Diagnosis not present

## 2013-02-01 DIAGNOSIS — H6120 Impacted cerumen, unspecified ear: Secondary | ICD-10-CM | POA: Diagnosis not present

## 2013-02-01 DIAGNOSIS — N186 End stage renal disease: Secondary | ICD-10-CM | POA: Diagnosis not present

## 2013-02-01 DIAGNOSIS — N2581 Secondary hyperparathyroidism of renal origin: Secondary | ICD-10-CM | POA: Diagnosis not present

## 2013-02-01 DIAGNOSIS — E7800 Pure hypercholesterolemia, unspecified: Secondary | ICD-10-CM | POA: Diagnosis not present

## 2013-02-01 DIAGNOSIS — E119 Type 2 diabetes mellitus without complications: Secondary | ICD-10-CM | POA: Diagnosis not present

## 2013-02-01 DIAGNOSIS — N189 Chronic kidney disease, unspecified: Secondary | ICD-10-CM | POA: Diagnosis not present

## 2013-02-01 DIAGNOSIS — Z1331 Encounter for screening for depression: Secondary | ICD-10-CM | POA: Diagnosis not present

## 2013-02-01 DIAGNOSIS — Z1212 Encounter for screening for malignant neoplasm of rectum: Secondary | ICD-10-CM | POA: Diagnosis not present

## 2013-02-01 DIAGNOSIS — Z992 Dependence on renal dialysis: Secondary | ICD-10-CM | POA: Diagnosis not present

## 2013-02-01 DIAGNOSIS — D631 Anemia in chronic kidney disease: Secondary | ICD-10-CM | POA: Diagnosis not present

## 2013-02-01 DIAGNOSIS — D509 Iron deficiency anemia, unspecified: Secondary | ICD-10-CM | POA: Diagnosis not present

## 2013-02-02 ENCOUNTER — Encounter: Payer: Self-pay | Admitting: Internal Medicine

## 2013-02-05 ENCOUNTER — Telehealth: Payer: Self-pay

## 2013-02-05 DIAGNOSIS — N186 End stage renal disease: Secondary | ICD-10-CM | POA: Diagnosis not present

## 2013-02-05 DIAGNOSIS — D631 Anemia in chronic kidney disease: Secondary | ICD-10-CM | POA: Diagnosis not present

## 2013-02-05 DIAGNOSIS — D509 Iron deficiency anemia, unspecified: Secondary | ICD-10-CM | POA: Diagnosis not present

## 2013-02-05 DIAGNOSIS — N2581 Secondary hyperparathyroidism of renal origin: Secondary | ICD-10-CM | POA: Diagnosis not present

## 2013-02-05 NOTE — Telephone Encounter (Signed)
Can pt have cardiac clearance for colonoscopy at [facility]?

## 2013-02-06 ENCOUNTER — Ambulatory Visit (AMBULATORY_SURGERY_CENTER): Payer: Self-pay

## 2013-02-06 VITALS — Ht 71.0 in | Wt 200.4 lb

## 2013-02-06 DIAGNOSIS — Z86010 Personal history of colonic polyps: Secondary | ICD-10-CM

## 2013-02-06 MED ORDER — MOVIPREP 100 G PO SOLR: ORAL | Status: DC

## 2013-02-07 DIAGNOSIS — N2581 Secondary hyperparathyroidism of renal origin: Secondary | ICD-10-CM | POA: Diagnosis not present

## 2013-02-07 DIAGNOSIS — D631 Anemia in chronic kidney disease: Secondary | ICD-10-CM | POA: Diagnosis not present

## 2013-02-07 DIAGNOSIS — N186 End stage renal disease: Secondary | ICD-10-CM | POA: Diagnosis not present

## 2013-02-07 DIAGNOSIS — D509 Iron deficiency anemia, unspecified: Secondary | ICD-10-CM | POA: Diagnosis not present

## 2013-02-09 DIAGNOSIS — D631 Anemia in chronic kidney disease: Secondary | ICD-10-CM | POA: Diagnosis not present

## 2013-02-09 DIAGNOSIS — N2581 Secondary hyperparathyroidism of renal origin: Secondary | ICD-10-CM | POA: Diagnosis not present

## 2013-02-09 DIAGNOSIS — D509 Iron deficiency anemia, unspecified: Secondary | ICD-10-CM | POA: Diagnosis not present

## 2013-02-09 DIAGNOSIS — N186 End stage renal disease: Secondary | ICD-10-CM | POA: Diagnosis not present

## 2013-02-12 DIAGNOSIS — D509 Iron deficiency anemia, unspecified: Secondary | ICD-10-CM | POA: Diagnosis not present

## 2013-02-12 DIAGNOSIS — N186 End stage renal disease: Secondary | ICD-10-CM | POA: Diagnosis not present

## 2013-02-12 DIAGNOSIS — N2581 Secondary hyperparathyroidism of renal origin: Secondary | ICD-10-CM | POA: Diagnosis not present

## 2013-02-12 DIAGNOSIS — D631 Anemia in chronic kidney disease: Secondary | ICD-10-CM | POA: Diagnosis not present

## 2013-02-13 DIAGNOSIS — H353290 Exudative age-related macular degeneration, unspecified eye, stage unspecified: Secondary | ICD-10-CM | POA: Diagnosis not present

## 2013-02-14 DIAGNOSIS — D509 Iron deficiency anemia, unspecified: Secondary | ICD-10-CM | POA: Diagnosis not present

## 2013-02-14 DIAGNOSIS — N2581 Secondary hyperparathyroidism of renal origin: Secondary | ICD-10-CM | POA: Diagnosis not present

## 2013-02-14 DIAGNOSIS — D631 Anemia in chronic kidney disease: Secondary | ICD-10-CM | POA: Diagnosis not present

## 2013-02-14 DIAGNOSIS — N186 End stage renal disease: Secondary | ICD-10-CM | POA: Diagnosis not present

## 2013-02-16 DIAGNOSIS — D631 Anemia in chronic kidney disease: Secondary | ICD-10-CM | POA: Diagnosis not present

## 2013-02-16 DIAGNOSIS — N2581 Secondary hyperparathyroidism of renal origin: Secondary | ICD-10-CM | POA: Diagnosis not present

## 2013-02-16 DIAGNOSIS — D509 Iron deficiency anemia, unspecified: Secondary | ICD-10-CM | POA: Diagnosis not present

## 2013-02-16 DIAGNOSIS — N186 End stage renal disease: Secondary | ICD-10-CM | POA: Diagnosis not present

## 2013-02-19 DIAGNOSIS — N2581 Secondary hyperparathyroidism of renal origin: Secondary | ICD-10-CM | POA: Diagnosis not present

## 2013-02-19 DIAGNOSIS — N186 End stage renal disease: Secondary | ICD-10-CM | POA: Diagnosis not present

## 2013-02-19 DIAGNOSIS — D631 Anemia in chronic kidney disease: Secondary | ICD-10-CM | POA: Diagnosis not present

## 2013-02-19 DIAGNOSIS — D509 Iron deficiency anemia, unspecified: Secondary | ICD-10-CM | POA: Diagnosis not present

## 2013-02-21 DIAGNOSIS — N2581 Secondary hyperparathyroidism of renal origin: Secondary | ICD-10-CM | POA: Diagnosis not present

## 2013-02-21 DIAGNOSIS — D509 Iron deficiency anemia, unspecified: Secondary | ICD-10-CM | POA: Diagnosis not present

## 2013-02-21 DIAGNOSIS — D631 Anemia in chronic kidney disease: Secondary | ICD-10-CM | POA: Diagnosis not present

## 2013-02-21 DIAGNOSIS — N186 End stage renal disease: Secondary | ICD-10-CM | POA: Diagnosis not present

## 2013-02-24 DIAGNOSIS — N2581 Secondary hyperparathyroidism of renal origin: Secondary | ICD-10-CM | POA: Diagnosis not present

## 2013-02-24 DIAGNOSIS — D631 Anemia in chronic kidney disease: Secondary | ICD-10-CM | POA: Diagnosis not present

## 2013-02-24 DIAGNOSIS — N186 End stage renal disease: Secondary | ICD-10-CM | POA: Diagnosis not present

## 2013-02-24 DIAGNOSIS — D509 Iron deficiency anemia, unspecified: Secondary | ICD-10-CM | POA: Diagnosis not present

## 2013-02-25 DIAGNOSIS — N186 End stage renal disease: Secondary | ICD-10-CM | POA: Diagnosis not present

## 2013-02-27 ENCOUNTER — Encounter: Payer: Self-pay | Admitting: Gastroenterology

## 2013-02-27 ENCOUNTER — Ambulatory Visit (AMBULATORY_SURGERY_CENTER): Payer: Medicare Other | Admitting: Gastroenterology

## 2013-02-27 VITALS — BP 122/66 | HR 76 | Temp 96.8°F | Resp 20 | Ht 71.0 in | Wt 200.0 lb

## 2013-02-27 DIAGNOSIS — Z1211 Encounter for screening for malignant neoplasm of colon: Secondary | ICD-10-CM | POA: Diagnosis not present

## 2013-02-27 DIAGNOSIS — Z8673 Personal history of transient ischemic attack (TIA), and cerebral infarction without residual deficits: Secondary | ICD-10-CM | POA: Diagnosis not present

## 2013-02-27 DIAGNOSIS — Z860100 Personal history of colon polyps, unspecified: Secondary | ICD-10-CM

## 2013-02-27 DIAGNOSIS — D126 Benign neoplasm of colon, unspecified: Secondary | ICD-10-CM

## 2013-02-27 DIAGNOSIS — I10 Essential (primary) hypertension: Secondary | ICD-10-CM | POA: Diagnosis not present

## 2013-02-27 MED ORDER — SODIUM CHLORIDE 0.9 % IV SOLN: 500.0000 mL | INTRAVENOUS | Status: DC

## 2013-02-27 NOTE — Op Note (Addendum)
[facility] [address] [place], [address]   COLONOSCOPY PROCEDURE REPORT PATIENT: Alexander [name], Alexander [name]  MR#: [id number] BIRTHDATE: Jul 29, 1940 , 72  yrs. old GENDER: Male ENDOSCOPIST: Meryl Dare, MD, [facility] PROCEDURE DATE:  02/27/2013 PROCEDURE:   Colonoscopy with snare polypectomy First Screening Colonoscopy - Avg.  risk and is 50 yrs.  old or older - No.  Prior Negative Screening - Now for repeat screening. N/A  History of Adenoma - Now for follow-up colonoscopy & has been > or = to 3 yrs.  No.  It has been less than 3 yrs since last colonoscopy.  Medical reason.  Polyps Removed Today? Yes. ASA CLASS:   Class III INDICATIONS:Patient's personal history of adenomatous colon polyps.  MEDICATIONS: MAC sedation, administered by CRNA and propofol (Diprivan) 250mg  IV DESCRIPTION OF PROCEDURE:   After the risks benefits and alternatives of the procedure were thoroughly explained, informed consent was obtained.  A digital rectal exam revealed no abnormalities of the rectum.   The LB [id number]  endoscope was introduced through the anus and advanced to the cecum, which was identified by both the appendix and ileocecal valve. No adverse events experienced wtih a tortuous colon.   The quality of the prep was good, using MoviPrep  The instrument was then slowly withdrawn as the colon was fully examined.  COLON FINDINGS: A 1 cm sessile polyp was found in the ascending colon.  A polypectomy was performed using snare cautery.  The resection was complete and the polyp tissue was completely retrieved.   Three sessile polyps measuring 7-8 mm in size were found in the hepatic flexure. A polypectomy was performed using snare cautery. The resection was complete and the polyp tissue was completely retrieved.   A sessile polyp measuring 7 mm in size was found in the transverse colon.  A polypectomy was performed using snare cautery  The resection was complete and the polyp  tissue was completely retrieved.  Moderate diverticulosis was noted in the descending colon and sigmoid colon.  The colon was otherwise normal. There was no diverticulosis, inflammation, polyps or cancers unless previously stated.  Retroflexed views revealed internal hemorrhoids. The time to cecum=6 minutes 30 seconds. Withdrawal time=15 minutes 32 seconds.  The scope was withdrawn and the procedure completed. COMPLICATIONS: There were no complications. ENDOSCOPIC IMPRESSION: 1.   Sessile polyp measuring 1 cm in the ascending colon; polypectomy performed using snare cautery 2.   Three sessile polyps measuring 7-8 mm in the hepatic flexure; polypectomy performed using snare cautery 3.   Sessile polyp measuring 7 mm in size in the transverse colon; polypectomy performed using snare cautery 4.   Moderate diverticulosis in the descending colon and sigmoid colon 5.   Large internal hemorrhoids RECOMMENDATIONS: 1.  Await pathology results 2.  Hold aspirin, aspirin products, and anti-inflammatory medication for 2 weeks. 3.  High fiber diet with liberal fluid intake. 4.  Repeat Colonoscopy in 3 years. eSigned:  Meryl Dare, MD, [facility] 02/27/2013 2:01 PM Revised: 02/27/2013 2:01 PM cc: Romero Liner, MD   PATIENT NAME:  Alexander [name], Alexander [name] MR#: [id number]

## 2013-02-27 NOTE — Progress Notes (Signed)
Patient did not experience any of the following events: a burn prior to discharge; a fall within the facility; wrong site/side/patient/procedure/implant event; or a hospital transfer or hospital admission upon discharge from the facility. (G8907) Patient did not have preoperative order for IV antibiotic SSI prophylaxis. (G8918)  

## 2013-02-27 NOTE — Patient Instructions (Signed)

## 2013-02-27 NOTE — Progress Notes (Signed)
Called to room to assist during endoscopic procedure.  Patient ID and intended procedure confirmed with present staff. Received instructions for my participation in the procedure from the performing physician.  

## 2013-02-28 ENCOUNTER — Telehealth: Payer: Self-pay | Admitting: *Deleted

## 2013-02-28 DIAGNOSIS — N186 End stage renal disease: Secondary | ICD-10-CM | POA: Diagnosis not present

## 2013-02-28 DIAGNOSIS — N2581 Secondary hyperparathyroidism of renal origin: Secondary | ICD-10-CM | POA: Diagnosis not present

## 2013-02-28 DIAGNOSIS — D509 Iron deficiency anemia, unspecified: Secondary | ICD-10-CM | POA: Diagnosis not present

## 2013-02-28 DIAGNOSIS — D631 Anemia in chronic kidney disease: Secondary | ICD-10-CM | POA: Diagnosis not present

## 2013-02-28 NOTE — Telephone Encounter (Signed)
  Follow up Call-  Call back number 02/27/2013 02/16/2011  Post procedure Call Back phone  # [phone] (wife's #) [phone]  Permission to leave phone message Yes -     Patient questions:  Do you have a fever, pain , or abdominal swelling? no Pain Score  0 *  Have you tolerated food without any problems? yes  Have you been able to return to your normal activities? yes  Do you have any questions about your discharge instructions: Diet   no Medications  no Follow up visit  no  Do you have questions or concerns about your Care? no  Actions: * If pain score is 4 or above: No action needed, pain <4.

## 2013-03-02 DIAGNOSIS — D509 Iron deficiency anemia, unspecified: Secondary | ICD-10-CM | POA: Diagnosis not present

## 2013-03-02 DIAGNOSIS — N2581 Secondary hyperparathyroidism of renal origin: Secondary | ICD-10-CM | POA: Diagnosis not present

## 2013-03-02 DIAGNOSIS — D631 Anemia in chronic kidney disease: Secondary | ICD-10-CM | POA: Diagnosis not present

## 2013-03-02 DIAGNOSIS — N186 End stage renal disease: Secondary | ICD-10-CM | POA: Diagnosis not present

## 2013-03-05 DIAGNOSIS — N2581 Secondary hyperparathyroidism of renal origin: Secondary | ICD-10-CM | POA: Diagnosis not present

## 2013-03-05 DIAGNOSIS — N186 End stage renal disease: Secondary | ICD-10-CM | POA: Diagnosis not present

## 2013-03-05 DIAGNOSIS — D631 Anemia in chronic kidney disease: Secondary | ICD-10-CM | POA: Diagnosis not present

## 2013-03-05 DIAGNOSIS — D509 Iron deficiency anemia, unspecified: Secondary | ICD-10-CM | POA: Diagnosis not present

## 2013-03-06 ENCOUNTER — Encounter: Payer: Self-pay | Admitting: Gastroenterology

## 2013-03-07 DIAGNOSIS — D509 Iron deficiency anemia, unspecified: Secondary | ICD-10-CM | POA: Diagnosis not present

## 2013-03-07 DIAGNOSIS — D631 Anemia in chronic kidney disease: Secondary | ICD-10-CM | POA: Diagnosis not present

## 2013-03-07 DIAGNOSIS — N186 End stage renal disease: Secondary | ICD-10-CM | POA: Diagnosis not present

## 2013-03-07 DIAGNOSIS — N2581 Secondary hyperparathyroidism of renal origin: Secondary | ICD-10-CM | POA: Diagnosis not present

## 2013-03-09 DIAGNOSIS — N186 End stage renal disease: Secondary | ICD-10-CM | POA: Diagnosis not present

## 2013-03-09 DIAGNOSIS — D509 Iron deficiency anemia, unspecified: Secondary | ICD-10-CM | POA: Diagnosis not present

## 2013-03-09 DIAGNOSIS — D631 Anemia in chronic kidney disease: Secondary | ICD-10-CM | POA: Diagnosis not present

## 2013-03-09 DIAGNOSIS — N2581 Secondary hyperparathyroidism of renal origin: Secondary | ICD-10-CM | POA: Diagnosis not present

## 2013-03-12 DIAGNOSIS — D509 Iron deficiency anemia, unspecified: Secondary | ICD-10-CM | POA: Diagnosis not present

## 2013-03-12 DIAGNOSIS — D631 Anemia in chronic kidney disease: Secondary | ICD-10-CM | POA: Diagnosis not present

## 2013-03-12 DIAGNOSIS — N2581 Secondary hyperparathyroidism of renal origin: Secondary | ICD-10-CM | POA: Diagnosis not present

## 2013-03-12 DIAGNOSIS — N186 End stage renal disease: Secondary | ICD-10-CM | POA: Diagnosis not present

## 2013-03-13 ENCOUNTER — Encounter: Payer: Self-pay | Admitting: Internal Medicine

## 2013-03-13 ENCOUNTER — Ambulatory Visit (INDEPENDENT_AMBULATORY_CARE_PROVIDER_SITE_OTHER): Payer: Medicare Other | Admitting: Internal Medicine

## 2013-03-13 VITALS — Ht 70.5 in | Wt 198.6 lb

## 2013-03-13 DIAGNOSIS — I359 Nonrheumatic aortic valve disorder, unspecified: Secondary | ICD-10-CM | POA: Diagnosis not present

## 2013-03-13 DIAGNOSIS — I350 Nonrheumatic aortic (valve) stenosis: Secondary | ICD-10-CM

## 2013-03-13 DIAGNOSIS — N186 End stage renal disease: Secondary | ICD-10-CM

## 2013-03-13 DIAGNOSIS — I4891 Unspecified atrial fibrillation: Secondary | ICD-10-CM

## 2013-03-13 DIAGNOSIS — I739 Peripheral vascular disease, unspecified: Secondary | ICD-10-CM | POA: Diagnosis not present

## 2013-03-13 DIAGNOSIS — R079 Chest pain, unspecified: Secondary | ICD-10-CM

## 2013-03-13 DIAGNOSIS — I480 Paroxysmal atrial fibrillation: Secondary | ICD-10-CM

## 2013-03-13 DIAGNOSIS — I351 Nonrheumatic aortic (valve) insufficiency: Secondary | ICD-10-CM

## 2013-03-13 NOTE — Patient Instructions (Signed)
Your physician has requested that you have an echocardiogram. Echocardiography is a painless test that uses sound waves to create images of your heart. It provides your doctor with information about the size and shape of your heart and how well your heart's chambers and valves are working. This procedure takes approximately one hour. There are no restrictions for this procedure. Have the echo prior to your follow up visit.   Your physician wants you to follow-up in: 6 months. You will receive a reminder letter in the mail two months in advance. If you don't receive a letter, please call our office to schedule the follow-up appointment.

## 2013-03-13 NOTE — Progress Notes (Signed)
03/13/2013   PCP: Londell Moh, MD   Chief Complaint  Patient presents with  . ROV 3 months    No complaints.    HPI:  72 year old white married male, previously followed by Dr. Alanda Amass. I had actually admitted him to the hospital back in the Spring and will be assuming care of him as his cardiologist. He has a history of atrial fibrillation on chronic amiodarone therapy, chronic renal failure with end-stage renal disease on hemodialysis Monday Wednesday Friday, diabetes mellitus, hyperlipidemia, TIA in 2004. Patient's most recent recent nuclear stress test was August 2014 and was normal. Quit smoking 14 years ago. He was at hemodialysis 12/08/12 when he suddenly developed a 10 out of 10 chest pressure which was more in the epigastric region. The patient stated that he felt like "they pumped to quick" at hemodialysis. By the time EMS arrived at hemodialysis his pain had resolved and he has had none since. He reported some diaphoresis but otherwise denied nausea, vomiting, fever, shortness of breath, orthopnea, dizziness, PND, cough, congestion, abdominal pain, hematochezia, melena, lower extremity edema, claudication.  His troponin's were negative.  He was seen by Dr. Rennis Golden and felt stable for discharge. He is back today as stated.  Patient has been quite well since the emergency room visit he denies any further chest pain, no lightheadedness, no dizziness, no complaints at all.  We reviewed his carotid dopplers and his abd. Ultrasound.  He reports that he continues to go to [facility] for some followup of his carotid Dopplers. 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 He unfortunately is not a candidate for renal transplant 22 peripheral arterial disease and vessel calcification. His last echocardiogram as mentioned was in February of 2014 which showed at least moderate aortic stenosis.  Allergies  Allergen Reactions  . Penicillins  Swelling    Current Outpatient Prescriptions  Medication Sig Dispense Refill  . acetaminophen (TYLENOL) 500 MG tablet Take 1,000 mg by mouth every 6 (six) hours as needed for pain.      [name] amiodarone (PACERONE) 200 MG tablet Take 200 mg by mouth daily.       [name] aspirin 81 MG tablet Take 81 mg by mouth daily.       [name] atorvastatin (LIPITOR) 40 MG tablet Take 20 mg by mouth daily.      [name] b complex-vitamin c-folic acid (NEPHRO-VITE) 0.8 MG TABS Take 0.8 mg by mouth daily.       . bromocriptine (PARLODEL) 5 MG capsule Take 5 mg by mouth daily.       . calcium acetate (PHOSLO) 667 MG capsule Take 1,334 mg by mouth 3 (three) times daily with meals.      . Cetirizine HCl 10 MG CAPS Take by mouth daily.      . cinacalcet (SENSIPAR) 30 MG tablet Take 30 mg by mouth daily.      [name] FLUoxetine (PROZAC) 20 MG capsule Take 40 mg by mouth daily.       [name] ipratropium (ATROVENT) 0.03 % nasal spray Place 1 spray into the nose daily as needed.      . latanoprost (XALATAN) 0.005 % ophthalmic solution Place 1 drop into both eyes at bedtime.      [name] omeprazole (PRILOSEC) 20 MG capsule Take 20 mg by mouth daily.         Current Facility-Administered Medications  Medication Dose Route Frequency Provider Last Rate Last Dose  .  dextrose 5 % solution   Intravenous Continuous Meryl Dare, MD        Past Medical History  Diagnosis Date  . End stage renal disease     hemodialysis 3 times a week  . Seasonal allergies   . Hyperlipidemia   . Anemia   . Depression   . Macular degeneration     both eyes  . GERD (gastroesophageal reflux disease)   . Hypertension   . CVA (cerebral infarction)     2004/affected left side  . Peptic ulcer     bleeding, 1969  . Kidney stones   . Renal insufficiency   . Diverticulosis   . Tubular adenoma of colon 07/2001  . Barrett's esophagus 05/2003  . Stroke 2004  . Colon polyps   . Renal failure   . Hemodialysis patient   . Aortic stenosis 06/15/12    TEE - EF 55-60%; grade 1  diastolic dysfunction; mild/mod aortic valve stenosis; Mitral valve had calcified annulus, mild pulm htn PA peak pressure  . Atrial fibrillation 04/01/10    14 day event monitor - some sinus bradycardia, PACs and sinus tachycardia  . Atrial fibrillation 11/03/09    R/P MV - EF 66%; normal myocardial perfusion study, w/o chest pain or EKG changes for ischemia  . S/P epidural steroid injection     last  injection over 10 years ago    Past Surgical History  Procedure Laterality Date  . Av fistula placement  2009  . Laminectomy  1969  . Tonsillectomy  1964  . 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 Cystoscopy  several times    kidney stones  . Back surgery      BJY:NWGNFAO:ZH colds or fevers, no weight changes Skin:no rashes or ulcers HEENT:no blurred vision, no congestion CV:see HPI PUL:see HPI GI:no diarrhea, constipation or melena, no indigestion GU:no hematuria, no dysuria MS:no joint pain, no claudication Neuro:no syncope, no lightheadedness Endo:+ diabetes, no thyroid disease  PHYSICAL EXAM Ht 5' 10.5" (1.791 m)  Wt 198 lb 9.6 oz (90.084 kg)  BMI 28.08 kg/m2 General:Pleasant affect, NAD Skin:Warm and dry, brisk capillary refill HEENT:normocephalic, sclera clear, mucus membranes moist Neck:supple, no JVD, + carotid bruits bil. Heart:S1S2 RRR, there is a mid-peaking 3/6 systolic aortic murmur at the RUSB, that does not radiate Lungs:clear without rales, rhonchi, or wheezes YQM:VHQI, non tender, + BS, do not palpate liver spleen or masses Ext:no lower ext edema, 2+ pedal pulses, 2+ radial pulses Neuro:alert and oriented, MAE, follows commands, + facial symmetry  EKG: Sinus bradycardia rate of 53, 1st degree AV block PR 226 ms  ASSESSMENT AND PLAN Patient Active Problem List   Diagnosis Date Noted  . ESRD (end stage renal disease) on dialysis 03/13/2013  . PAF (paroxysmal atrial fibrillation) 12/12/2012  . PAD (peripheral artery disease) 12/12/2012  . Chest  pain 12/08/2012  . Aortic stenosis 06/15/2012  . Personal history of colonic polyps 02/03/2011  . Esophageal reflux 02/03/2011  . Anemia, unspecified 02/03/2011   PLAN: 1.  Mr. Lagman has at least moderate aortic stenosis. He was last assessed in February of 2014 and should have a repeat echo prior to his next visit in 6 months. At this point I would not recommend changing any of his medications. He seems to do fairly well at dialysis as long as excess fluid is not removed.  Chrystie Nose, MD, [facility] Attending Cardiologist [facility]

## 2013-03-14 DIAGNOSIS — D631 Anemia in chronic kidney disease: Secondary | ICD-10-CM | POA: Diagnosis not present

## 2013-03-14 DIAGNOSIS — N2581 Secondary hyperparathyroidism of renal origin: Secondary | ICD-10-CM | POA: Diagnosis not present

## 2013-03-14 DIAGNOSIS — N186 End stage renal disease: Secondary | ICD-10-CM | POA: Diagnosis not present

## 2013-03-14 DIAGNOSIS — D509 Iron deficiency anemia, unspecified: Secondary | ICD-10-CM | POA: Diagnosis not present

## 2013-03-16 DIAGNOSIS — D509 Iron deficiency anemia, unspecified: Secondary | ICD-10-CM | POA: Diagnosis not present

## 2013-03-16 DIAGNOSIS — N186 End stage renal disease: Secondary | ICD-10-CM | POA: Diagnosis not present

## 2013-03-16 DIAGNOSIS — D631 Anemia in chronic kidney disease: Secondary | ICD-10-CM | POA: Diagnosis not present

## 2013-03-16 DIAGNOSIS — N2581 Secondary hyperparathyroidism of renal origin: Secondary | ICD-10-CM | POA: Diagnosis not present

## 2013-03-18 DIAGNOSIS — N2581 Secondary hyperparathyroidism of renal origin: Secondary | ICD-10-CM | POA: Diagnosis not present

## 2013-03-18 DIAGNOSIS — D631 Anemia in chronic kidney disease: Secondary | ICD-10-CM | POA: Diagnosis not present

## 2013-03-18 DIAGNOSIS — D509 Iron deficiency anemia, unspecified: Secondary | ICD-10-CM | POA: Diagnosis not present

## 2013-03-18 DIAGNOSIS — N186 End stage renal disease: Secondary | ICD-10-CM | POA: Diagnosis not present

## 2013-03-20 DIAGNOSIS — N186 End stage renal disease: Secondary | ICD-10-CM | POA: Diagnosis not present

## 2013-03-20 DIAGNOSIS — D631 Anemia in chronic kidney disease: Secondary | ICD-10-CM | POA: Diagnosis not present

## 2013-03-20 DIAGNOSIS — N2581 Secondary hyperparathyroidism of renal origin: Secondary | ICD-10-CM | POA: Diagnosis not present

## 2013-03-20 DIAGNOSIS — D509 Iron deficiency anemia, unspecified: Secondary | ICD-10-CM | POA: Diagnosis not present

## 2013-03-23 DIAGNOSIS — N2581 Secondary hyperparathyroidism of renal origin: Secondary | ICD-10-CM | POA: Diagnosis not present

## 2013-03-23 DIAGNOSIS — D631 Anemia in chronic kidney disease: Secondary | ICD-10-CM | POA: Diagnosis not present

## 2013-03-23 DIAGNOSIS — N186 End stage renal disease: Secondary | ICD-10-CM | POA: Diagnosis not present

## 2013-03-23 DIAGNOSIS — D509 Iron deficiency anemia, unspecified: Secondary | ICD-10-CM | POA: Diagnosis not present

## 2013-03-25 DIAGNOSIS — N2581 Secondary hyperparathyroidism of renal origin: Secondary | ICD-10-CM | POA: Diagnosis not present

## 2013-03-25 DIAGNOSIS — N186 End stage renal disease: Secondary | ICD-10-CM | POA: Diagnosis not present

## 2013-03-25 DIAGNOSIS — D631 Anemia in chronic kidney disease: Secondary | ICD-10-CM | POA: Diagnosis not present

## 2013-03-25 DIAGNOSIS — D509 Iron deficiency anemia, unspecified: Secondary | ICD-10-CM | POA: Diagnosis not present

## 2013-03-27 DIAGNOSIS — N2581 Secondary hyperparathyroidism of renal origin: Secondary | ICD-10-CM | POA: Diagnosis not present

## 2013-03-27 DIAGNOSIS — N186 End stage renal disease: Secondary | ICD-10-CM | POA: Diagnosis not present

## 2013-03-27 DIAGNOSIS — D509 Iron deficiency anemia, unspecified: Secondary | ICD-10-CM | POA: Diagnosis not present

## 2013-03-27 DIAGNOSIS — D631 Anemia in chronic kidney disease: Secondary | ICD-10-CM | POA: Diagnosis not present

## 2013-03-28 DIAGNOSIS — N186 End stage renal disease: Secondary | ICD-10-CM | POA: Diagnosis not present

## 2013-03-30 DIAGNOSIS — N039 Chronic nephritic syndrome with unspecified morphologic changes: Secondary | ICD-10-CM | POA: Diagnosis not present

## 2013-03-30 DIAGNOSIS — N2581 Secondary hyperparathyroidism of renal origin: Secondary | ICD-10-CM | POA: Diagnosis not present

## 2013-03-30 DIAGNOSIS — N186 End stage renal disease: Secondary | ICD-10-CM | POA: Diagnosis not present

## 2013-03-30 DIAGNOSIS — D631 Anemia in chronic kidney disease: Secondary | ICD-10-CM | POA: Diagnosis not present

## 2013-04-02 DIAGNOSIS — N186 End stage renal disease: Secondary | ICD-10-CM | POA: Diagnosis not present

## 2013-04-02 DIAGNOSIS — D631 Anemia in chronic kidney disease: Secondary | ICD-10-CM | POA: Diagnosis not present

## 2013-04-02 DIAGNOSIS — N2581 Secondary hyperparathyroidism of renal origin: Secondary | ICD-10-CM | POA: Diagnosis not present

## 2013-04-04 DIAGNOSIS — N186 End stage renal disease: Secondary | ICD-10-CM | POA: Diagnosis not present

## 2013-04-04 DIAGNOSIS — N2581 Secondary hyperparathyroidism of renal origin: Secondary | ICD-10-CM | POA: Diagnosis not present

## 2013-04-04 DIAGNOSIS — D631 Anemia in chronic kidney disease: Secondary | ICD-10-CM | POA: Diagnosis not present

## 2013-04-04 DIAGNOSIS — N039 Chronic nephritic syndrome with unspecified morphologic changes: Secondary | ICD-10-CM | POA: Diagnosis not present

## 2013-04-06 DIAGNOSIS — N2581 Secondary hyperparathyroidism of renal origin: Secondary | ICD-10-CM | POA: Diagnosis not present

## 2013-04-06 DIAGNOSIS — D631 Anemia in chronic kidney disease: Secondary | ICD-10-CM | POA: Diagnosis not present

## 2013-04-06 DIAGNOSIS — N186 End stage renal disease: Secondary | ICD-10-CM | POA: Diagnosis not present

## 2013-04-09 DIAGNOSIS — N186 End stage renal disease: Secondary | ICD-10-CM | POA: Diagnosis not present

## 2013-04-09 DIAGNOSIS — D631 Anemia in chronic kidney disease: Secondary | ICD-10-CM | POA: Diagnosis not present

## 2013-04-09 DIAGNOSIS — N2581 Secondary hyperparathyroidism of renal origin: Secondary | ICD-10-CM | POA: Diagnosis not present

## 2013-04-11 DIAGNOSIS — D631 Anemia in chronic kidney disease: Secondary | ICD-10-CM | POA: Diagnosis not present

## 2013-04-11 DIAGNOSIS — N2581 Secondary hyperparathyroidism of renal origin: Secondary | ICD-10-CM | POA: Diagnosis not present

## 2013-04-11 DIAGNOSIS — N039 Chronic nephritic syndrome with unspecified morphologic changes: Secondary | ICD-10-CM | POA: Diagnosis not present

## 2013-04-11 DIAGNOSIS — N186 End stage renal disease: Secondary | ICD-10-CM | POA: Diagnosis not present

## 2013-04-13 DIAGNOSIS — N2581 Secondary hyperparathyroidism of renal origin: Secondary | ICD-10-CM | POA: Diagnosis not present

## 2013-04-13 DIAGNOSIS — N186 End stage renal disease: Secondary | ICD-10-CM | POA: Diagnosis not present

## 2013-04-13 DIAGNOSIS — D631 Anemia in chronic kidney disease: Secondary | ICD-10-CM | POA: Diagnosis not present

## 2013-04-16 DIAGNOSIS — D631 Anemia in chronic kidney disease: Secondary | ICD-10-CM | POA: Diagnosis not present

## 2013-04-16 DIAGNOSIS — N186 End stage renal disease: Secondary | ICD-10-CM | POA: Diagnosis not present

## 2013-04-16 DIAGNOSIS — N2581 Secondary hyperparathyroidism of renal origin: Secondary | ICD-10-CM | POA: Diagnosis not present

## 2013-04-17 DIAGNOSIS — Z961 Presence of intraocular lens: Secondary | ICD-10-CM | POA: Diagnosis not present

## 2013-04-17 DIAGNOSIS — H353290 Exudative age-related macular degeneration, unspecified eye, stage unspecified: Secondary | ICD-10-CM | POA: Diagnosis not present

## 2013-04-18 DIAGNOSIS — N039 Chronic nephritic syndrome with unspecified morphologic changes: Secondary | ICD-10-CM | POA: Diagnosis not present

## 2013-04-18 DIAGNOSIS — N2581 Secondary hyperparathyroidism of renal origin: Secondary | ICD-10-CM | POA: Diagnosis not present

## 2013-04-18 DIAGNOSIS — N186 End stage renal disease: Secondary | ICD-10-CM | POA: Diagnosis not present

## 2013-04-18 DIAGNOSIS — D631 Anemia in chronic kidney disease: Secondary | ICD-10-CM | POA: Diagnosis not present

## 2013-04-20 DIAGNOSIS — D631 Anemia in chronic kidney disease: Secondary | ICD-10-CM | POA: Diagnosis not present

## 2013-04-20 DIAGNOSIS — N186 End stage renal disease: Secondary | ICD-10-CM | POA: Diagnosis not present

## 2013-04-20 DIAGNOSIS — N2581 Secondary hyperparathyroidism of renal origin: Secondary | ICD-10-CM | POA: Diagnosis not present

## 2013-04-23 DIAGNOSIS — N039 Chronic nephritic syndrome with unspecified morphologic changes: Secondary | ICD-10-CM | POA: Diagnosis not present

## 2013-04-23 DIAGNOSIS — D631 Anemia in chronic kidney disease: Secondary | ICD-10-CM | POA: Diagnosis not present

## 2013-04-23 DIAGNOSIS — N186 End stage renal disease: Secondary | ICD-10-CM | POA: Diagnosis not present

## 2013-04-23 DIAGNOSIS — N2581 Secondary hyperparathyroidism of renal origin: Secondary | ICD-10-CM | POA: Diagnosis not present

## 2013-04-25 DIAGNOSIS — E1129 Type 2 diabetes mellitus with other diabetic kidney complication: Secondary | ICD-10-CM | POA: Diagnosis not present

## 2013-04-25 DIAGNOSIS — N2581 Secondary hyperparathyroidism of renal origin: Secondary | ICD-10-CM | POA: Diagnosis not present

## 2013-04-25 DIAGNOSIS — D631 Anemia in chronic kidney disease: Secondary | ICD-10-CM | POA: Diagnosis not present

## 2013-04-25 DIAGNOSIS — N039 Chronic nephritic syndrome with unspecified morphologic changes: Secondary | ICD-10-CM | POA: Diagnosis not present

## 2013-04-25 DIAGNOSIS — N186 End stage renal disease: Secondary | ICD-10-CM | POA: Diagnosis not present

## 2013-04-27 DIAGNOSIS — N2581 Secondary hyperparathyroidism of renal origin: Secondary | ICD-10-CM | POA: Diagnosis not present

## 2013-04-27 DIAGNOSIS — N186 End stage renal disease: Secondary | ICD-10-CM | POA: Diagnosis not present

## 2013-04-28 DIAGNOSIS — N186 End stage renal disease: Secondary | ICD-10-CM | POA: Diagnosis not present

## 2013-04-30 DIAGNOSIS — N2581 Secondary hyperparathyroidism of renal origin: Secondary | ICD-10-CM | POA: Diagnosis not present

## 2013-04-30 DIAGNOSIS — N186 End stage renal disease: Secondary | ICD-10-CM | POA: Diagnosis not present

## 2013-04-30 DIAGNOSIS — D631 Anemia in chronic kidney disease: Secondary | ICD-10-CM | POA: Diagnosis not present

## 2013-05-26 DIAGNOSIS — N186 End stage renal disease: Secondary | ICD-10-CM | POA: Diagnosis not present

## 2013-05-28 DIAGNOSIS — D631 Anemia in chronic kidney disease: Secondary | ICD-10-CM | POA: Diagnosis not present

## 2013-05-28 DIAGNOSIS — N2581 Secondary hyperparathyroidism of renal origin: Secondary | ICD-10-CM | POA: Diagnosis not present

## 2013-05-28 DIAGNOSIS — D509 Iron deficiency anemia, unspecified: Secondary | ICD-10-CM | POA: Diagnosis not present

## 2013-05-28 DIAGNOSIS — N186 End stage renal disease: Secondary | ICD-10-CM | POA: Diagnosis not present

## 2013-05-31 DIAGNOSIS — H548 Legal blindness, as defined in USA: Secondary | ICD-10-CM | POA: Diagnosis not present

## 2013-05-31 DIAGNOSIS — H353290 Exudative age-related macular degeneration, unspecified eye, stage unspecified: Secondary | ICD-10-CM | POA: Diagnosis not present

## 2013-06-07 DIAGNOSIS — H543 Unqualified visual loss, both eyes: Secondary | ICD-10-CM | POA: Diagnosis not present

## 2013-06-14 DIAGNOSIS — H543 Unqualified visual loss, both eyes: Secondary | ICD-10-CM | POA: Diagnosis not present

## 2013-06-21 DIAGNOSIS — H543 Unqualified visual loss, both eyes: Secondary | ICD-10-CM | POA: Diagnosis not present

## 2013-06-26 DIAGNOSIS — N186 End stage renal disease: Secondary | ICD-10-CM | POA: Diagnosis not present

## 2013-06-27 DIAGNOSIS — D631 Anemia in chronic kidney disease: Secondary | ICD-10-CM | POA: Diagnosis not present

## 2013-06-27 DIAGNOSIS — N186 End stage renal disease: Secondary | ICD-10-CM | POA: Diagnosis not present

## 2013-06-27 DIAGNOSIS — E1129 Type 2 diabetes mellitus with other diabetic kidney complication: Secondary | ICD-10-CM | POA: Diagnosis not present

## 2013-06-28 DIAGNOSIS — H543 Unqualified visual loss, both eyes: Secondary | ICD-10-CM | POA: Diagnosis not present

## 2013-07-03 DIAGNOSIS — H543 Unqualified visual loss, both eyes: Secondary | ICD-10-CM | POA: Diagnosis not present

## 2013-07-05 ENCOUNTER — Encounter: Payer: Self-pay | Admitting: Gastroenterology

## 2013-07-10 DIAGNOSIS — H3530 Unspecified macular degeneration: Secondary | ICD-10-CM | POA: Diagnosis not present

## 2013-07-10 DIAGNOSIS — H40059 Ocular hypertension, unspecified eye: Secondary | ICD-10-CM | POA: Diagnosis not present

## 2013-07-10 DIAGNOSIS — I6529 Occlusion and stenosis of unspecified carotid artery: Secondary | ICD-10-CM | POA: Diagnosis not present

## 2013-07-10 DIAGNOSIS — H4011X0 Primary open-angle glaucoma, stage unspecified: Secondary | ICD-10-CM | POA: Diagnosis not present

## 2013-07-10 DIAGNOSIS — Z961 Presence of intraocular lens: Secondary | ICD-10-CM | POA: Diagnosis not present

## 2013-07-10 DIAGNOSIS — H401190 Primary open-angle glaucoma, unspecified eye, stage unspecified: Secondary | ICD-10-CM | POA: Insufficient documentation

## 2013-07-10 DIAGNOSIS — H43819 Vitreous degeneration, unspecified eye: Secondary | ICD-10-CM | POA: Diagnosis not present

## 2013-07-10 DIAGNOSIS — H409 Unspecified glaucoma: Secondary | ICD-10-CM | POA: Diagnosis not present

## 2013-07-10 DIAGNOSIS — Z79899 Other long term (current) drug therapy: Secondary | ICD-10-CM | POA: Diagnosis not present

## 2013-07-10 DIAGNOSIS — H353290 Exudative age-related macular degeneration, unspecified eye, stage unspecified: Secondary | ICD-10-CM | POA: Diagnosis not present

## 2013-07-12 DIAGNOSIS — D443 Neoplasm of uncertain behavior of pituitary gland: Secondary | ICD-10-CM | POA: Diagnosis not present

## 2013-07-12 DIAGNOSIS — I10 Essential (primary) hypertension: Secondary | ICD-10-CM | POA: Diagnosis not present

## 2013-07-12 DIAGNOSIS — E119 Type 2 diabetes mellitus without complications: Secondary | ICD-10-CM | POA: Diagnosis not present

## 2013-07-12 DIAGNOSIS — E7800 Pure hypercholesterolemia, unspecified: Secondary | ICD-10-CM | POA: Diagnosis not present

## 2013-07-18 DIAGNOSIS — E1129 Type 2 diabetes mellitus with other diabetic kidney complication: Secondary | ICD-10-CM | POA: Diagnosis not present

## 2013-07-19 DIAGNOSIS — D353 Benign neoplasm of craniopharyngeal duct: Secondary | ICD-10-CM | POA: Diagnosis not present

## 2013-07-19 DIAGNOSIS — D352 Benign neoplasm of pituitary gland: Secondary | ICD-10-CM | POA: Diagnosis not present

## 2013-07-19 DIAGNOSIS — H543 Unqualified visual loss, both eyes: Secondary | ICD-10-CM | POA: Diagnosis not present

## 2013-07-19 DIAGNOSIS — E789 Disorder of lipoprotein metabolism, unspecified: Secondary | ICD-10-CM | POA: Diagnosis not present

## 2013-07-19 DIAGNOSIS — I4891 Unspecified atrial fibrillation: Secondary | ICD-10-CM | POA: Diagnosis not present

## 2013-07-26 DIAGNOSIS — N186 End stage renal disease: Secondary | ICD-10-CM | POA: Diagnosis not present

## 2013-07-27 DIAGNOSIS — E1129 Type 2 diabetes mellitus with other diabetic kidney complication: Secondary | ICD-10-CM | POA: Diagnosis not present

## 2013-07-27 DIAGNOSIS — D631 Anemia in chronic kidney disease: Secondary | ICD-10-CM | POA: Diagnosis not present

## 2013-07-27 DIAGNOSIS — N186 End stage renal disease: Secondary | ICD-10-CM | POA: Diagnosis not present

## 2013-07-31 DIAGNOSIS — H543 Unqualified visual loss, both eyes: Secondary | ICD-10-CM | POA: Diagnosis not present

## 2013-08-16 DIAGNOSIS — I6350 Cerebral infarction due to unspecified occlusion or stenosis of unspecified cerebral artery: Secondary | ICD-10-CM | POA: Diagnosis not present

## 2013-08-16 DIAGNOSIS — I6529 Occlusion and stenosis of unspecified carotid artery: Secondary | ICD-10-CM | POA: Diagnosis not present

## 2013-08-26 DIAGNOSIS — N186 End stage renal disease: Secondary | ICD-10-CM | POA: Diagnosis not present

## 2013-08-27 ENCOUNTER — Ambulatory Visit (INDEPENDENT_AMBULATORY_CARE_PROVIDER_SITE_OTHER): Payer: Medicare Other | Admitting: Internal Medicine

## 2013-08-27 ENCOUNTER — Encounter: Payer: Self-pay | Admitting: Internal Medicine

## 2013-08-27 ENCOUNTER — Encounter: Payer: Self-pay | Admitting: Gastroenterology

## 2013-08-27 VITALS — BP 154/92 | HR 55 | Ht 70.0 in | Wt 203.4 lb

## 2013-08-27 DIAGNOSIS — I359 Nonrheumatic aortic valve disorder, unspecified: Secondary | ICD-10-CM | POA: Diagnosis not present

## 2013-08-27 DIAGNOSIS — N186 End stage renal disease: Secondary | ICD-10-CM | POA: Diagnosis not present

## 2013-08-27 DIAGNOSIS — E1129 Type 2 diabetes mellitus with other diabetic kidney complication: Secondary | ICD-10-CM | POA: Diagnosis not present

## 2013-08-27 DIAGNOSIS — D631 Anemia in chronic kidney disease: Secondary | ICD-10-CM | POA: Diagnosis not present

## 2013-08-27 DIAGNOSIS — I480 Paroxysmal atrial fibrillation: Secondary | ICD-10-CM

## 2013-08-27 DIAGNOSIS — I350 Nonrheumatic aortic (valve) stenosis: Secondary | ICD-10-CM

## 2013-08-27 DIAGNOSIS — I739 Peripheral vascular disease, unspecified: Secondary | ICD-10-CM

## 2013-08-27 DIAGNOSIS — I4891 Unspecified atrial fibrillation: Secondary | ICD-10-CM

## 2013-08-27 DIAGNOSIS — Z992 Dependence on renal dialysis: Secondary | ICD-10-CM

## 2013-08-27 NOTE — Patient Instructions (Signed)
Please schedule your echocardiogram.   Your physician wants you to follow-up in: 6 months. You will receive a reminder letter in the mail two months in advance. If you don't receive a letter, please call our office to schedule the follow-up appointment.

## 2013-08-27 NOTE — Progress Notes (Signed)
08/27/2013   PCP: Horatio Pel, MD   Chief Complaint  Patient presents with  . 6 month visit    occasional lightheadedness/dizziness while in dialysis and a few other times per wife    HPI:  73 year old white married male, previously followed by Dr. Rollene Fare. I had actually admitted him to the hospital back in the Spring and will be assuming care of him as his cardiologist. He has a history of atrial fibrillation on chronic amiodarone therapy, chronic renal failure with end-stage renal disease on hemodialysis Monday Wednesday Friday, diabetes mellitus, hyperlipidemia, TIA in 2004. Patient's most recent recent nuclear stress test was August 2014 and was normal. Quit smoking 14 years ago. He was at hemodialysis 12/08/12 when he suddenly developed a 10 out of 10 chest pressure which was more in the epigastric region. The patient stated that he felt like "they pumped to quick" at hemodialysis. By the time EMS arrived at hemodialysis his pain had resolved and he has had none since. He reported some diaphoresis but otherwise denied nausea, vomiting, fever, shortness of breath, orthopnea, dizziness, PND, cough, congestion, abdominal pain, hematochezia, melena, lower extremity edema, claudication.  His troponin's were negative.  He was seen by [name] and felt stable for discharge. He is back today as stated.  Patient has been quite well since the emergency room visit he denies any further chest pain, no lightheadedness, no dizziness, no complaints at all.  We reviewed his carotid dopplers and his abd. Ultrasound.  He reports that he continues to go to [facility] for some followup of his carotid Dopplers. 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 He unfortunately is not a candidate for renal transplant 22 peripheral arterial disease and vessel calcification. His last echocardiogram as mentioned was in February of 2014 which showed at least moderate  aortic stenosis.  Mr. Zunker returns today for followup. He's been doing generally well at dialysis but does have episodes of hypotension. He feels dizzy during those episodes. This is certainly related to his aortic stenosis. This is felt to be moderate to severe and he is due for a repeat echocardiogram. I had ordered at his last visit as it now has been over a year since his last echo, but it was not obtained. He denies any chest pain or syncopal events.  Allergies  Allergen Reactions  . Penicillins Swelling    Current Outpatient Prescriptions  Medication Sig Dispense Refill  . acetaminophen (TYLENOL) 500 MG tablet Take 1,000 mg by mouth every 6 (six) hours as needed for pain.      [name] amiodarone (PACERONE) 200 MG tablet Take 200 mg by mouth daily.       [name] aspirin 81 MG tablet Take 81 mg by mouth daily.       [name] atorvastatin (LIPITOR) 40 MG tablet Take 20 mg by mouth daily.      [name] b complex-vitamin c-folic acid (NEPHRO-VITE) 0.8 MG TABS Take 0.8 mg by mouth daily.       . bromocriptine (PARLODEL) 5 MG capsule Take 5 mg by mouth daily.       . calcium acetate (PHOSLO) 667 MG capsule Take 1,334 mg by mouth 3 (three) times daily with meals.      . Cetirizine HCl 10 MG CAPS Take by mouth daily.      . cinacalcet (SENSIPAR) 30 MG tablet Take 30 mg by mouth daily.      [name]  dorzolamide-timolol (COSOPT) 22.3-6.8 MG/ML ophthalmic solution Place 1 drop into the right eye 2 (two) times daily.      [name] FLUoxetine (PROZAC) 20 MG capsule Take 40 mg by mouth daily.       [name] ipratropium (ATROVENT) 0.03 % nasal spray Place 1 spray into the nose daily as needed.      . latanoprost (XALATAN) 0.005 % ophthalmic solution Place 1 drop into both eyes at bedtime.      [name] omeprazole (PRILOSEC) 20 MG capsule Take 20 mg by mouth daily.         Current Facility-Administered Medications  Medication Dose Route Frequency Provider Last Rate Last Dose  . dextrose 5 % solution   Intravenous Continuous Ladene Artist, MD          Past Medical History  Diagnosis Date  . End stage renal disease     hemodialysis 3 times a week  . Seasonal allergies   . Hyperlipidemia   . Anemia   . Depression   . Macular degeneration     both eyes  . GERD (gastroesophageal reflux disease)   . Hypertension   . CVA (cerebral infarction)     2004/affected left side  . Peptic ulcer     bleeding, 1969  . Kidney stones   . Renal insufficiency   . Diverticulosis   . Tubular adenoma of colon 07/2001  . Barrett's esophagus 05/2003  . Stroke 2004  . Colon polyps   . Renal failure   . Hemodialysis patient   . Aortic stenosis 06/15/12    TEE - EF 95-09%; grade 1 diastolic dysfunction; mild/mod aortic valve stenosis; Mitral valve had calcified annulus, mild pulm htn PA peak pressure 58mmHg  . Atrial fibrillation 04/01/10    14 day event monitor - some sinus bradycardia, PACs and sinus tachycardia  . Atrial fibrillation 11/03/09    R/P MV - EF 66%; normal myocardial perfusion study, w/o chest pain or EKG changes for ischemia  . S/P epidural steroid injection     last  injection over 10 years ago    Past Surgical History  Procedure Laterality Date  . Av fistula placement  2009  . Laminectomy  1969  . Tonsillectomy  1964  . 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 Cystoscopy  several times    kidney stones  . Back surgery      TOI:ZTIWPYK:DX colds or fevers, no weight changes Skin:no rashes or ulcers HEENT:no blurred vision, no congestion CV:see HPI PUL:see HPI GI:no diarrhea, constipation or melena, no indigestion GU:no hematuria, no dysuria MS:no joint pain, no claudication Neuro:no syncope, no lightheadedness Endo:+ diabetes, no thyroid disease  PHYSICAL EXAM BP 154/92  Pulse 55  Ht 5\' 10"  (1.778 m)  Wt 203 lb 6.4 oz (92.262 kg)  BMI 29.18 kg/m2 General:Pleasant affect, NAD Skin:Warm and dry, brisk capillary refill HEENT:normocephalic, sclera clear, mucus membranes moist Neck:supple, no JVD, + carotid bruits  bil. Heart:S1S2 RRR, there is a mid-peaking 3/6 systolic aortic murmur at the RUSB, that does not radiate Lungs:clear without rales, rhonchi, or wheezes IPJ:ASNK, non tender, + BS, do not palpate liver spleen or masses Ext:no lower ext edema, 2+ pedal pulses, 2+ radial pulses Neuro:alert and oriented, MAE, follows commands, + facial symmetry  EKG: Sinus bradycardia rate of 53, 1st degree AV block PR 226 ms  ASSESSMENT AND PLAN Patient Active Problem List   Diagnosis Date Noted  . ESRD (end stage renal disease) on dialysis 03/13/2013  .  PAF (paroxysmal atrial fibrillation) 12/12/2012  . PAD (peripheral artery disease) 12/12/2012  . Chest pain 12/08/2012  . Aortic stenosis 06/15/2012  . Personal history of colonic polyps 02/03/2011  . Esophageal reflux 02/03/2011  . Anemia, unspecified 02/03/2011   PLAN: 1.  Mr. Hageman has at least moderate aortic stenosis. He has been having some episodes of hypotension at dialysis and does get mildly short of breath with exertion. He is however able to mow his own lawn albeit at a slow pace. His clinical exam today is concerning for more severe aortic stenosis and I will again refer him for echocardiogram to evaluate this. We briefly discussed some options regarding surgical management and/or possibly TAVR as alternatives for aortic valve replacement. Followup will be scheduled for 6 months however based on his echo results he may need to be seen sooner.  He was advised that he should discuss with his nephrologist to make sure that the volume is not taken off too quickly at dialysis as he is relatively preload dependent.  Pixie Casino, MD, [facility] Attending Cardiologist [place]

## 2013-08-30 ENCOUNTER — Ambulatory Visit (HOSPITAL_COMMUNITY)
Admission: RE | Admit: 2013-08-30 | Discharge: 2013-08-30 | Disposition: A | Discharge status: Home / Self Care | Payer: Medicare Other | Source: Ambulatory Visit | Attending: Internal Medicine | Admitting: Internal Medicine

## 2013-08-30 DIAGNOSIS — I369 Nonrheumatic tricuspid valve disorder, unspecified: Secondary | ICD-10-CM | POA: Diagnosis not present

## 2013-08-30 DIAGNOSIS — I351 Nonrheumatic aortic (valve) insufficiency: Secondary | ICD-10-CM

## 2013-08-30 DIAGNOSIS — I359 Nonrheumatic aortic valve disorder, unspecified: Secondary | ICD-10-CM | POA: Diagnosis not present

## 2013-08-30 NOTE — Progress Notes (Signed)
2D Echo Performed 08/30/2013    Lamerle Jabs, RCS  

## 2013-09-17 DIAGNOSIS — N186 End stage renal disease: Secondary | ICD-10-CM | POA: Diagnosis not present

## 2013-09-19 DIAGNOSIS — N186 End stage renal disease: Secondary | ICD-10-CM | POA: Diagnosis not present

## 2013-09-21 DIAGNOSIS — N186 End stage renal disease: Secondary | ICD-10-CM | POA: Diagnosis not present

## 2013-09-25 DIAGNOSIS — N186 End stage renal disease: Secondary | ICD-10-CM | POA: Diagnosis not present

## 2013-09-26 ENCOUNTER — Telehealth: Payer: Self-pay | Admitting: *Deleted

## 2013-09-26 DIAGNOSIS — N186 End stage renal disease: Secondary | ICD-10-CM | POA: Diagnosis not present

## 2013-09-26 DIAGNOSIS — N039 Chronic nephritic syndrome with unspecified morphologic changes: Secondary | ICD-10-CM | POA: Diagnosis not present

## 2013-09-26 DIAGNOSIS — E1129 Type 2 diabetes mellitus with other diabetic kidney complication: Secondary | ICD-10-CM | POA: Diagnosis not present

## 2013-09-26 DIAGNOSIS — D631 Anemia in chronic kidney disease: Secondary | ICD-10-CM | POA: Diagnosis not present

## 2013-09-26 NOTE — Telephone Encounter (Signed)
Alexander [name],  This pt is cleared for care at [facility]  Thanks,  Jenny Reichmann

## 2013-09-26 NOTE — Telephone Encounter (Signed)
Alexander [name],  This pt has a complicated hx.  He is scheduled for an EGD 10-11-13.  Is he ok for [place] or does he need an OV first?  The last time he saw Dr. Fuller Plan in the office was in 2012.  Thanks, [employer]

## 2013-09-27 ENCOUNTER — Ambulatory Visit (AMBULATORY_SURGERY_CENTER): Payer: Self-pay

## 2013-09-27 VITALS — Ht 70.5 in | Wt 204.0 lb

## 2013-09-27 DIAGNOSIS — K2270 Barrett's esophagus without dysplasia: Secondary | ICD-10-CM

## 2013-09-27 NOTE — Progress Notes (Signed)
No allergies to eggs or soy No past problems with anesthesia No home oxygen No diet/weight loss meds ON DIALYSIS 3 TIMES WEEKLY  Has email  Emmi instructions given for endoscopy

## 2013-10-11 ENCOUNTER — Encounter: Payer: Self-pay | Admitting: Gastroenterology

## 2013-10-11 ENCOUNTER — Ambulatory Visit (AMBULATORY_SURGERY_CENTER): Payer: Medicare Other | Admitting: Gastroenterology

## 2013-10-11 VITALS — BP 131/58 | HR 51 | Temp 98.2°F | Resp 26 | Ht 70.0 in | Wt 204.0 lb

## 2013-10-11 DIAGNOSIS — K2270 Barrett's esophagus without dysplasia: Secondary | ICD-10-CM | POA: Diagnosis not present

## 2013-10-11 DIAGNOSIS — Z8673 Personal history of transient ischemic attack (TIA), and cerebral infarction without residual deficits: Secondary | ICD-10-CM | POA: Diagnosis not present

## 2013-10-11 DIAGNOSIS — N19 Unspecified kidney failure: Secondary | ICD-10-CM | POA: Diagnosis not present

## 2013-10-11 DIAGNOSIS — K2970 Gastritis, unspecified, without bleeding: Secondary | ICD-10-CM | POA: Diagnosis not present

## 2013-10-11 DIAGNOSIS — K2960 Other gastritis without bleeding: Secondary | ICD-10-CM | POA: Diagnosis not present

## 2013-10-11 DIAGNOSIS — K2990 Gastroduodenitis, unspecified, without bleeding: Secondary | ICD-10-CM

## 2013-10-11 MED ORDER — SODIUM CHLORIDE 0.9 % IV SOLN: 500.0000 mL | INTRAVENOUS | Status: DC

## 2013-10-11 NOTE — Progress Notes (Signed)
Called to room to assist during endoscopic procedure.  Patient ID and intended procedure confirmed with present staff. Received instructions for my participation in the procedure from the performing physician.  

## 2013-10-11 NOTE — Patient Instructions (Signed)

## 2013-10-11 NOTE — Op Note (Signed)
[place]  [address] [place], [address]   ENDOSCOPY PROCEDURE REPORT  PATIENT: Alexander, [name]  MR#: [id number] BIRTHDATE: 06-08-1940 , 73  yrs. old GENDER: Male ENDOSCOPIST: Ladene Artist, MD, [facility] REFERRED BY:  Alden Server, M.D. PROCEDURE DATE:  10/11/2013 PROCEDURE:  EGD w/ biopsy ASA CLASS:     Class III INDICATIONS:  history of Barrett's esophagus. MEDICATIONS: MAC sedation, administered by CRNA and propofol (Diprivan) 100mg  IV TOPICAL ANESTHETIC: none DESCRIPTION OF PROCEDURE: After the risks benefits and alternatives of the procedure were thoroughly explained, informed consent was obtained.  The LB [id number] endoscope was introduced through the mouth and advanced to the second portion of the duodenum. Without limitations.  The instrument was slowly withdrawn as the mucosa was fully examined.    ESOPHAGUS: A variable Z-line was observed at 38 cm from incisors. Multiple biopsies were performed.   The esophagus was otherwise normal. STOMACH: Moderate gastritis was found in the gastric body, gastric fundus, and gastric antrum.  Multiple biopsies were performed. The stomach otherwise appeared normal. DUODENUM: The duodenal mucosa showed no abnormalities in the bulb and second portion of the duodenum.  Retroflexed views revealed no abnormalities.     The scope was then withdrawn from the patient and the procedure completed.  COMPLICATIONS: There were no complications.  ENDOSCOPIC IMPRESSION: 1.   Variable Z-line was observed; multiple biopsies 2.   Gastritis in the gastric body, gastric fundus, and gastric antrum; multiple biopsies  RECOMMENDATIONS: 1.  Anti-reflux regimen long term 2.  Await pathology results. If Barrett's with no dysplasia is noted follow up EGD in 5 years 3.  PPI qam long term   eSigned:  Ladene Artist, MD, [facility] 10/11/2013 10:44 AM

## 2013-10-11 NOTE — Progress Notes (Signed)
A/ox3 pleased with MAC, report to Penny RN 

## 2013-10-12 ENCOUNTER — Telehealth: Payer: Self-pay | Admitting: *Deleted

## 2013-10-12 NOTE — Telephone Encounter (Signed)
  Follow up Call-  Call back number 10/11/2013 02/27/2013 02/16/2011  Post procedure Call Back phone  # [phone] (wife's #) [phone]  Permission to leave phone message Yes Yes -     Patient questions:  Do you have a fever, pain , or abdominal swelling? No. Pain Score  0 *  Have you tolerated food without any problems? Yes.    Have you been able to return to your normal activities? Yes.    Do you have any questions about your discharge instructions: Diet   No. Medications  No. Follow up visit  No.  Do you have questions or concerns about your Care? No.  Actions: * If pain score is 4 or above: No action needed, pain <4.

## 2013-10-16 ENCOUNTER — Encounter: Payer: Self-pay | Admitting: Gastroenterology

## 2013-10-17 DIAGNOSIS — E1129 Type 2 diabetes mellitus with other diabetic kidney complication: Secondary | ICD-10-CM | POA: Diagnosis not present

## 2013-10-26 DIAGNOSIS — N186 End stage renal disease: Secondary | ICD-10-CM | POA: Diagnosis not present

## 2013-10-29 DIAGNOSIS — D631 Anemia in chronic kidney disease: Secondary | ICD-10-CM | POA: Diagnosis not present

## 2013-10-29 DIAGNOSIS — E1129 Type 2 diabetes mellitus with other diabetic kidney complication: Secondary | ICD-10-CM | POA: Diagnosis not present

## 2013-10-29 DIAGNOSIS — N186 End stage renal disease: Secondary | ICD-10-CM | POA: Diagnosis not present

## 2013-10-29 DIAGNOSIS — N039 Chronic nephritic syndrome with unspecified morphologic changes: Secondary | ICD-10-CM | POA: Diagnosis not present

## 2013-11-06 DIAGNOSIS — H4011X0 Primary open-angle glaucoma, stage unspecified: Secondary | ICD-10-CM | POA: Diagnosis not present

## 2013-11-06 DIAGNOSIS — H409 Unspecified glaucoma: Secondary | ICD-10-CM | POA: Diagnosis not present

## 2013-11-06 DIAGNOSIS — Z961 Presence of intraocular lens: Secondary | ICD-10-CM | POA: Diagnosis not present

## 2013-11-26 DIAGNOSIS — N186 End stage renal disease: Secondary | ICD-10-CM | POA: Diagnosis not present

## 2013-11-28 DIAGNOSIS — E1129 Type 2 diabetes mellitus with other diabetic kidney complication: Secondary | ICD-10-CM | POA: Diagnosis not present

## 2013-11-28 DIAGNOSIS — N186 End stage renal disease: Secondary | ICD-10-CM | POA: Diagnosis not present

## 2013-11-28 DIAGNOSIS — N039 Chronic nephritic syndrome with unspecified morphologic changes: Secondary | ICD-10-CM | POA: Diagnosis not present

## 2013-11-28 DIAGNOSIS — D631 Anemia in chronic kidney disease: Secondary | ICD-10-CM | POA: Diagnosis not present

## 2013-12-04 DIAGNOSIS — H548 Legal blindness, as defined in USA: Secondary | ICD-10-CM | POA: Diagnosis not present

## 2013-12-18 ENCOUNTER — Telehealth: Payer: Self-pay | Admitting: Internal Medicine

## 2013-12-18 NOTE — Telephone Encounter (Signed)
Closed encounter °

## 2013-12-26 DIAGNOSIS — N186 End stage renal disease: Secondary | ICD-10-CM | POA: Diagnosis not present

## 2013-12-28 DIAGNOSIS — N186 End stage renal disease: Secondary | ICD-10-CM | POA: Diagnosis not present

## 2013-12-28 DIAGNOSIS — D631 Anemia in chronic kidney disease: Secondary | ICD-10-CM | POA: Diagnosis not present

## 2013-12-28 DIAGNOSIS — E1129 Type 2 diabetes mellitus with other diabetic kidney complication: Secondary | ICD-10-CM | POA: Diagnosis not present

## 2013-12-28 DIAGNOSIS — Z23 Encounter for immunization: Secondary | ICD-10-CM | POA: Diagnosis not present

## 2014-01-10 DIAGNOSIS — R946 Abnormal results of thyroid function studies: Secondary | ICD-10-CM | POA: Diagnosis not present

## 2014-01-10 DIAGNOSIS — E789 Disorder of lipoprotein metabolism, unspecified: Secondary | ICD-10-CM | POA: Diagnosis not present

## 2014-01-17 DIAGNOSIS — I10 Essential (primary) hypertension: Secondary | ICD-10-CM | POA: Diagnosis not present

## 2014-01-17 DIAGNOSIS — E780 Pure hypercholesterolemia: Secondary | ICD-10-CM | POA: Diagnosis not present

## 2014-01-17 DIAGNOSIS — N19 Unspecified kidney failure: Secondary | ICD-10-CM | POA: Diagnosis not present

## 2014-01-17 DIAGNOSIS — D352 Benign neoplasm of pituitary gland: Secondary | ICD-10-CM | POA: Diagnosis not present

## 2014-01-23 DIAGNOSIS — E1129 Type 2 diabetes mellitus with other diabetic kidney complication: Secondary | ICD-10-CM | POA: Diagnosis not present

## 2014-01-26 DIAGNOSIS — N186 End stage renal disease: Secondary | ICD-10-CM | POA: Diagnosis not present

## 2014-01-26 DIAGNOSIS — Z992 Dependence on renal dialysis: Secondary | ICD-10-CM | POA: Diagnosis not present

## 2014-01-28 DIAGNOSIS — N186 End stage renal disease: Secondary | ICD-10-CM | POA: Diagnosis not present

## 2014-01-28 DIAGNOSIS — E1129 Type 2 diabetes mellitus with other diabetic kidney complication: Secondary | ICD-10-CM | POA: Diagnosis not present

## 2014-01-28 DIAGNOSIS — D509 Iron deficiency anemia, unspecified: Secondary | ICD-10-CM | POA: Diagnosis not present

## 2014-01-28 DIAGNOSIS — D631 Anemia in chronic kidney disease: Secondary | ICD-10-CM | POA: Diagnosis not present

## 2014-02-05 ENCOUNTER — Other Ambulatory Visit: Payer: Self-pay | Admitting: *Deleted

## 2014-02-05 MED ORDER — AMIODARONE HCL 200 MG PO TABS: 200.0000 mg | ORAL_TABLET | Freq: Every day | ORAL | Status: DC

## 2014-02-07 DIAGNOSIS — J40 Bronchitis, not specified as acute or chronic: Secondary | ICD-10-CM | POA: Diagnosis not present

## 2014-02-25 DIAGNOSIS — N186 End stage renal disease: Secondary | ICD-10-CM | POA: Diagnosis not present

## 2014-02-25 DIAGNOSIS — Z992 Dependence on renal dialysis: Secondary | ICD-10-CM | POA: Diagnosis not present

## 2014-02-26 ENCOUNTER — Encounter: Payer: Self-pay | Admitting: Internal Medicine

## 2014-02-26 ENCOUNTER — Ambulatory Visit (INDEPENDENT_AMBULATORY_CARE_PROVIDER_SITE_OTHER): Payer: Medicare Other | Admitting: Internal Medicine

## 2014-02-26 VITALS — BP 128/64 | HR 52 | Ht 69.0 in | Wt 207.6 lb

## 2014-02-26 DIAGNOSIS — Z5181 Encounter for therapeutic drug level monitoring: Secondary | ICD-10-CM

## 2014-02-26 DIAGNOSIS — I350 Nonrheumatic aortic (valve) stenosis: Secondary | ICD-10-CM | POA: Diagnosis not present

## 2014-02-26 DIAGNOSIS — I480 Paroxysmal atrial fibrillation: Secondary | ICD-10-CM

## 2014-02-26 DIAGNOSIS — I739 Peripheral vascular disease, unspecified: Secondary | ICD-10-CM | POA: Diagnosis not present

## 2014-02-26 DIAGNOSIS — Z992 Dependence on renal dialysis: Secondary | ICD-10-CM

## 2014-02-26 DIAGNOSIS — N186 End stage renal disease: Secondary | ICD-10-CM | POA: Diagnosis not present

## 2014-02-26 DIAGNOSIS — Z79899 Other long term (current) drug therapy: Secondary | ICD-10-CM

## 2014-02-26 LAB — HEPATIC FUNCTION PANEL
ALT: 22 U/L (ref 0–53)
AST: 23 U/L (ref 0–37)
Albumin: 4 g/dL (ref 3.5–5.2)
Alkaline Phosphatase: 57 U/L (ref 39–117)
Bilirubin, Direct: 0.2 mg/dL (ref 0.0–0.3)
Indirect Bilirubin: 0.4 mg/dL (ref 0.2–1.2)
Total Bilirubin: 0.6 mg/dL (ref 0.2–1.2)
Total Protein: 6.6 g/dL (ref 6.0–8.3)

## 2014-02-26 LAB — TSH: TSH: 1.599 u[IU]/mL (ref 0.350–4.500)

## 2014-02-26 NOTE — Progress Notes (Signed)
02/26/2014   PCP: Horatio Pel, MD   Chief Complaint  Patient presents with  . 6 month visit    dizzy after dialysis (normal)     HPI:  73 year old white married male, previously followed by Dr. Rollene Fare. I had actually admitted him to the hospital back in the Spring and will be assuming care of him as his cardiologist. He has a history of atrial fibrillation on chronic amiodarone therapy, chronic renal failure with end-stage renal disease on hemodialysis Monday Wednesday Friday, diabetes mellitus, hyperlipidemia, TIA in 2004. Patient's most recent recent nuclear stress test was August 2014 and was normal. Quit smoking 14 years ago. He was at hemodialysis 12/08/12 when he suddenly developed a 10 out of 10 chest pressure which was more in the epigastric region. The patient stated that he felt like "they pumped to quick" at hemodialysis. By the time EMS arrived at hemodialysis his pain had resolved and he has had none since. He reported some diaphoresis but otherwise denied nausea, vomiting, fever, shortness of breath, orthopnea, dizziness, PND, cough, congestion, abdominal pain, hematochezia, melena, lower extremity edema, claudication.  His troponin's were negative.  He was seen by [name] and felt stable for discharge. He is back today as stated.  Patient has been quite well since the emergency room visit he denies any further chest pain, no lightheadedness, no dizziness, no complaints at all.  We reviewed his carotid dopplers and his abd. Ultrasound.  He reports that he continues to go to [facility] for some followup of his carotid Dopplers. 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 He unfortunately is not a candidate for renal transplant 22 peripheral arterial disease and vessel calcification. His last echocardiogram as mentioned was in February of 2014 which showed at least moderate aortic stenosis.  Mr. Baranek returns today for  followup. He's been doing generally well at dialysis but does have episodes of hypotension. He feels dizzy during those episodes. This is certainly related to his aortic stenosis. Recent echo in June 2015 demonstrated moderate aortic stenosis with a valve area of 1-1.1 cm, however was interpreted as mild. On physical exam the murmur is mid to late peaking consistent with more moderate to severe aortic stenosis. He continues to be fairly stable with his episodes of hypotension however we may need to consider adding low-dose Midodrine prior to his dialysis sessions. He denies any chest pain. He has not had recent screening laboratory work for his amiodarone therapy.  Allergies  Allergen Reactions  . Penicillins Swelling    Current Outpatient Prescriptions  Medication Sig Dispense Refill  . acetaminophen (TYLENOL) 500 MG tablet Take 1,000 mg by mouth every 6 (six) hours as needed for pain.    [name] amiodarone (PACERONE) 200 MG tablet Take 1 tablet (200 mg total) by mouth daily. 90 tablet 1  . aspirin 81 MG tablet Take 81 mg by mouth daily.     [name] atorvastatin (LIPITOR) 40 MG tablet Take 20 mg by mouth daily.    [name] b complex-vitamin c-folic acid (NEPHRO-VITE) 0.8 MG TABS Take 0.8 mg by mouth daily.     . bromocriptine (PARLODEL) 5 MG capsule Take 5 mg by mouth daily.     . calcium acetate (PHOSLO) 667 MG capsule Take 1,334 mg by mouth 3 (three) times daily with meals.    . Cetirizine HCl 10 MG CAPS Take by mouth daily.    . cinacalcet (SENSIPAR) 30 MG  tablet Take 30 mg by mouth daily.    . dorzolamide-timolol (COSOPT) 22.3-6.8 MG/ML ophthalmic solution Place 1 drop into the right eye 2 (two) times daily.    [name] FLUoxetine (PROZAC) 20 MG capsule Take 40 mg by mouth daily.     [name] latanoprost (XALATAN) 0.005 % ophthalmic solution Place 1 drop into both eyes at bedtime.    [name] omeprazole (PRILOSEC) 20 MG capsule Take 20 mg by mouth daily.       Current Facility-Administered Medications  Medication Dose Route  Frequency Provider Last Rate Last Dose  . dextrose 5 % solution   Intravenous Continuous Ladene Artist, MD        Past Medical History  Diagnosis Date  . End stage renal disease     hemodialysis 3 times a week  . Seasonal allergies   . Hyperlipidemia   . Anemia   . Depression   . Macular degeneration     both eyes  . GERD (gastroesophageal reflux disease)   . Hypertension   . CVA (cerebral infarction)     2004/affected left side  . Peptic ulcer     bleeding, 1969  . Kidney stones   . Renal insufficiency   . Diverticulosis   . Tubular adenoma of colon 07/2001  . Barrett's esophagus 05/2003  . Stroke 2004  . Colon polyps   . Renal failure   . Hemodialysis patient   . Aortic stenosis 06/15/12    TEE - EF 99-35%; grade 1 diastolic dysfunction; mild/mod aortic valve stenosis; Mitral valve had calcified annulus, mild pulm htn PA peak pressure 82mmHg  . Atrial fibrillation 04/01/10    14 day event monitor - some sinus bradycardia, PACs and sinus tachycardia  . Atrial fibrillation 11/03/09    R/P MV - EF 66%; normal myocardial perfusion study, w/o chest pain or EKG changes for ischemia  . S/P epidural steroid injection     last  injection over 10 years ago    Past Surgical History  Procedure Laterality Date  . Av fistula placement  2009  . Laminectomy  1969  . Tonsillectomy  1964  . 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 Cystoscopy  several times    kidney stones  . Back surgery      TSV:[id number]:ES colds or fevers, no weight changes Skin:no rashes or ulcers HEENT:no blurred vision, no congestion CV:see HPI PUL:see HPI GI:no diarrhea, constipation or melena, no indigestion GU:no hematuria, no dysuria MS:no joint pain, no claudication Neuro:no syncope, no lightheadedness Endo:+ diabetes, no thyroid disease  PHYSICAL EXAM BP 128/64 mmHg  Pulse 52  Ht 5\' 9"  (1.753 m)  Wt 207 lb 9.6 oz (94.167 kg)  BMI 30.64 kg/m2 General:Pleasant affect, NAD Skin:Warm and dry,  brisk capillary refill HEENT:normocephalic, sclera clear, mucus membranes moist Neck:supple, no JVD, + carotid bruits bil. Heart:S1S2 RRR, there is a mid-peaking 3/6 systolic aortic murmur at the RUSB, that does not radiate Lungs:clear without rales, rhonchi, or wheezes PQZ:RAQT, non tender, + BS, do not palpate liver spleen or masses Ext:no lower ext edema, 2+ pedal pulses, 2+ radial pulses Neuro:alert and oriented, MAE, follows commands, + facial symmetry  EKG: Sinus bradycardia rate of 52, 1st degree AV block PR 254 ms  ASSESSMENT AND PLAN Patient Active Problem List   Diagnosis Date Noted  . ESRD (end stage renal disease) on dialysis 03/13/2013  . PAF (paroxysmal atrial fibrillation) 12/12/2012  . PAD (peripheral artery disease) 12/12/2012  . Chest pain  12/08/2012  . Aortic stenosis 06/15/2012  . Personal history of colonic polyps 02/03/2011  . Esophageal reflux 02/03/2011  . Anemia, unspecified 02/03/2011   PLAN: 1.  Mr. Heyde has at least moderate aortic stenosis. He has been having some episodes of hypotension at dialysis and does get mildly short of breath with exertion. His echocardiogram shows fairly stable moderate aortic stenosis. This point he is not a need for valve replacement. We have discussed options. With regards to ongoing amiodarone there is no recurrent A. fib that I'm aware of. He will need repeat thyroid function testing, pulmonary function testing and liver function testing. Will order those today. Plan to see him back in 6 months or sooner as necessary.  Pixie Casino, MD, [facility] Attending Cardiologist [place]

## 2014-02-26 NOTE — Patient Instructions (Signed)
Your physician wants you to follow-up in: 6 months with [name]. You will receive a reminder letter in the mail two months in advance. If you don't receive a letter, please call our office to schedule the follow-up appointment.  Your physician has recommended that you have a pulmonary function test. Pulmonary Function Tests are a group of tests that measure how well air moves in and out of your lungs. >> at [facility] recommends that you return for lab work today or at your convenience.

## 2014-02-27 DIAGNOSIS — D509 Iron deficiency anemia, unspecified: Secondary | ICD-10-CM | POA: Diagnosis not present

## 2014-02-27 DIAGNOSIS — E1129 Type 2 diabetes mellitus with other diabetic kidney complication: Secondary | ICD-10-CM | POA: Diagnosis not present

## 2014-02-27 DIAGNOSIS — N186 End stage renal disease: Secondary | ICD-10-CM | POA: Diagnosis not present

## 2014-02-27 DIAGNOSIS — D631 Anemia in chronic kidney disease: Secondary | ICD-10-CM | POA: Diagnosis not present

## 2014-02-28 DIAGNOSIS — I10 Essential (primary) hypertension: Secondary | ICD-10-CM | POA: Diagnosis not present

## 2014-02-28 DIAGNOSIS — E118 Type 2 diabetes mellitus with unspecified complications: Secondary | ICD-10-CM | POA: Diagnosis not present

## 2014-02-28 DIAGNOSIS — Z125 Encounter for screening for malignant neoplasm of prostate: Secondary | ICD-10-CM | POA: Diagnosis not present

## 2014-02-28 DIAGNOSIS — N390 Urinary tract infection, site not specified: Secondary | ICD-10-CM | POA: Diagnosis not present

## 2014-02-28 DIAGNOSIS — E119 Type 2 diabetes mellitus without complications: Secondary | ICD-10-CM | POA: Diagnosis not present

## 2014-02-28 DIAGNOSIS — E780 Pure hypercholesterolemia: Secondary | ICD-10-CM | POA: Diagnosis not present

## 2014-02-28 DIAGNOSIS — Z0000 Encounter for general adult medical examination without abnormal findings: Secondary | ICD-10-CM | POA: Diagnosis not present

## 2014-03-01 DIAGNOSIS — E1129 Type 2 diabetes mellitus with other diabetic kidney complication: Secondary | ICD-10-CM | POA: Diagnosis not present

## 2014-03-01 DIAGNOSIS — D631 Anemia in chronic kidney disease: Secondary | ICD-10-CM | POA: Diagnosis not present

## 2014-03-01 DIAGNOSIS — N186 End stage renal disease: Secondary | ICD-10-CM | POA: Diagnosis not present

## 2014-03-01 DIAGNOSIS — D509 Iron deficiency anemia, unspecified: Secondary | ICD-10-CM | POA: Diagnosis not present

## 2014-03-04 DIAGNOSIS — N186 End stage renal disease: Secondary | ICD-10-CM | POA: Diagnosis not present

## 2014-03-04 DIAGNOSIS — D509 Iron deficiency anemia, unspecified: Secondary | ICD-10-CM | POA: Diagnosis not present

## 2014-03-04 DIAGNOSIS — E1129 Type 2 diabetes mellitus with other diabetic kidney complication: Secondary | ICD-10-CM | POA: Diagnosis not present

## 2014-03-04 DIAGNOSIS — D631 Anemia in chronic kidney disease: Secondary | ICD-10-CM | POA: Diagnosis not present

## 2014-03-06 DIAGNOSIS — N186 End stage renal disease: Secondary | ICD-10-CM | POA: Diagnosis not present

## 2014-03-06 DIAGNOSIS — D509 Iron deficiency anemia, unspecified: Secondary | ICD-10-CM | POA: Diagnosis not present

## 2014-03-06 DIAGNOSIS — E1129 Type 2 diabetes mellitus with other diabetic kidney complication: Secondary | ICD-10-CM | POA: Diagnosis not present

## 2014-03-06 DIAGNOSIS — D631 Anemia in chronic kidney disease: Secondary | ICD-10-CM | POA: Diagnosis not present

## 2014-03-07 DIAGNOSIS — H6120 Impacted cerumen, unspecified ear: Secondary | ICD-10-CM | POA: Diagnosis not present

## 2014-03-07 DIAGNOSIS — J311 Chronic nasopharyngitis: Secondary | ICD-10-CM | POA: Diagnosis not present

## 2014-03-07 DIAGNOSIS — R05 Cough: Secondary | ICD-10-CM | POA: Diagnosis not present

## 2014-03-07 DIAGNOSIS — H6123 Impacted cerumen, bilateral: Secondary | ICD-10-CM | POA: Diagnosis not present

## 2014-03-07 DIAGNOSIS — Z1212 Encounter for screening for malignant neoplasm of rectum: Secondary | ICD-10-CM | POA: Diagnosis not present

## 2014-03-08 DIAGNOSIS — D631 Anemia in chronic kidney disease: Secondary | ICD-10-CM | POA: Diagnosis not present

## 2014-03-08 DIAGNOSIS — N186 End stage renal disease: Secondary | ICD-10-CM | POA: Diagnosis not present

## 2014-03-08 DIAGNOSIS — E1129 Type 2 diabetes mellitus with other diabetic kidney complication: Secondary | ICD-10-CM | POA: Diagnosis not present

## 2014-03-08 DIAGNOSIS — D509 Iron deficiency anemia, unspecified: Secondary | ICD-10-CM | POA: Diagnosis not present

## 2014-03-11 DIAGNOSIS — D631 Anemia in chronic kidney disease: Secondary | ICD-10-CM | POA: Diagnosis not present

## 2014-03-11 DIAGNOSIS — N186 End stage renal disease: Secondary | ICD-10-CM | POA: Diagnosis not present

## 2014-03-11 DIAGNOSIS — E1129 Type 2 diabetes mellitus with other diabetic kidney complication: Secondary | ICD-10-CM | POA: Diagnosis not present

## 2014-03-11 DIAGNOSIS — D509 Iron deficiency anemia, unspecified: Secondary | ICD-10-CM | POA: Diagnosis not present

## 2014-03-12 DIAGNOSIS — H4011X0 Primary open-angle glaucoma, stage unspecified: Secondary | ICD-10-CM | POA: Diagnosis not present

## 2014-03-13 ENCOUNTER — Telehealth: Payer: Self-pay | Admitting: Internal Medicine

## 2014-03-13 DIAGNOSIS — E1129 Type 2 diabetes mellitus with other diabetic kidney complication: Secondary | ICD-10-CM | POA: Diagnosis not present

## 2014-03-13 DIAGNOSIS — N186 End stage renal disease: Secondary | ICD-10-CM | POA: Diagnosis not present

## 2014-03-13 DIAGNOSIS — D631 Anemia in chronic kidney disease: Secondary | ICD-10-CM | POA: Diagnosis not present

## 2014-03-13 DIAGNOSIS — D509 Iron deficiency anemia, unspecified: Secondary | ICD-10-CM | POA: Diagnosis not present

## 2014-03-13 NOTE — Telephone Encounter (Signed)
Pt scheduled for a test tomorrow at [facility]. Pt has a question about his instructions.

## 2014-03-13 NOTE — Telephone Encounter (Signed)
Instructions for B/A PFT's state no caffeine.  Calling to see if he can eat breakfast.  Call Pulmonary at [facility] to verify but had to leave a voice mail message.  Asked that they call the patient and verify restriction.  Wife notified she should be getting a call.

## 2014-03-14 ENCOUNTER — Ambulatory Visit (HOSPITAL_COMMUNITY)
Admission: RE | Admit: 2014-03-14 | Discharge: 2014-03-14 | Disposition: A | Discharge status: Home / Self Care | Payer: Medicare Other | Source: Ambulatory Visit | Attending: Internal Medicine | Admitting: Internal Medicine

## 2014-03-14 DIAGNOSIS — Z79899 Other long term (current) drug therapy: Secondary | ICD-10-CM | POA: Diagnosis not present

## 2014-03-14 DIAGNOSIS — Z5181 Encounter for therapeutic drug level monitoring: Secondary | ICD-10-CM

## 2014-03-14 LAB — PULMONARY FUNCTION TEST
DL/VA % pred: 91 %
DL/VA: 4.2 ml/min/mmHg/L
DLCO UNC % PRED: 84 %
DLCO UNC: 27.33 ml/min/mmHg
FEF 25-75 POST: 2.6 L/s
FEF 25-75 PRE: 1.74 L/s
FEF2575-%Change-Post: 49 %
FEF2575-%Pred-Post: 113 %
FEF2575-%Pred-Pre: 75 %
FEV1-%Change-Post: 8 %
FEV1-%Pred-Post: 99 %
FEV1-%Pred-Pre: 92 %
FEV1-POST: 3.1 L
FEV1-PRE: 2.87 L
FEV1FVC-%Change-Post: 0 %
FEV1FVC-%Pred-Pre: 98 %
FEV6-%CHANGE-POST: 8 %
FEV6-%Pred-Post: 104 %
FEV6-%Pred-Pre: 95 %
FEV6-PRE: 3.87 L
FEV6-Post: 4.21 L
FEV6FVC-%CHANGE-POST: 1 %
FEV6FVC-%PRED-POST: 104 %
FEV6FVC-%Pred-Pre: 103 %
FVC-%Change-Post: 7 %
FVC-%Pred-Post: 99 %
FVC-%Pred-Pre: 93 %
FVC-Post: 4.28 L
FVC-Pre: 3.99 L
PRE FEV6/FVC RATIO: 97 %
Post FEV1/FVC ratio: 72 %
Post FEV6/FVC ratio: 98 %
Pre FEV1/FVC ratio: 72 %
RV % pred: 77 %
RV: 1.94 L
TLC % pred: 91 %
TLC: 6.45 L

## 2014-03-14 MED ORDER — ALBUTEROL SULFATE (2.5 MG/3ML) 0.083% IN NEBU
2.5000 mg | INHALATION_SOLUTION | Freq: Once | RESPIRATORY_TRACT | Status: AC
  Administered 2014-03-14: 2.5 mg via RESPIRATORY_TRACT

## 2014-03-15 DIAGNOSIS — D509 Iron deficiency anemia, unspecified: Secondary | ICD-10-CM | POA: Diagnosis not present

## 2014-03-15 DIAGNOSIS — D631 Anemia in chronic kidney disease: Secondary | ICD-10-CM | POA: Diagnosis not present

## 2014-03-15 DIAGNOSIS — N186 End stage renal disease: Secondary | ICD-10-CM | POA: Diagnosis not present

## 2014-03-15 DIAGNOSIS — E1129 Type 2 diabetes mellitus with other diabetic kidney complication: Secondary | ICD-10-CM | POA: Diagnosis not present

## 2014-03-18 DIAGNOSIS — N186 End stage renal disease: Secondary | ICD-10-CM | POA: Diagnosis not present

## 2014-03-18 DIAGNOSIS — E1129 Type 2 diabetes mellitus with other diabetic kidney complication: Secondary | ICD-10-CM | POA: Diagnosis not present

## 2014-03-18 DIAGNOSIS — D631 Anemia in chronic kidney disease: Secondary | ICD-10-CM | POA: Diagnosis not present

## 2014-03-18 DIAGNOSIS — D509 Iron deficiency anemia, unspecified: Secondary | ICD-10-CM | POA: Diagnosis not present

## 2014-03-20 DIAGNOSIS — N186 End stage renal disease: Secondary | ICD-10-CM | POA: Diagnosis not present

## 2014-03-20 DIAGNOSIS — D509 Iron deficiency anemia, unspecified: Secondary | ICD-10-CM | POA: Diagnosis not present

## 2014-03-20 DIAGNOSIS — D631 Anemia in chronic kidney disease: Secondary | ICD-10-CM | POA: Diagnosis not present

## 2014-03-20 DIAGNOSIS — E1129 Type 2 diabetes mellitus with other diabetic kidney complication: Secondary | ICD-10-CM | POA: Diagnosis not present

## 2014-03-23 DIAGNOSIS — N186 End stage renal disease: Secondary | ICD-10-CM | POA: Diagnosis not present

## 2014-03-23 DIAGNOSIS — D509 Iron deficiency anemia, unspecified: Secondary | ICD-10-CM | POA: Diagnosis not present

## 2014-03-23 DIAGNOSIS — D631 Anemia in chronic kidney disease: Secondary | ICD-10-CM | POA: Diagnosis not present

## 2014-03-23 DIAGNOSIS — E1129 Type 2 diabetes mellitus with other diabetic kidney complication: Secondary | ICD-10-CM | POA: Diagnosis not present

## 2014-03-25 DIAGNOSIS — E1129 Type 2 diabetes mellitus with other diabetic kidney complication: Secondary | ICD-10-CM | POA: Diagnosis not present

## 2014-03-25 DIAGNOSIS — D509 Iron deficiency anemia, unspecified: Secondary | ICD-10-CM | POA: Diagnosis not present

## 2014-03-25 DIAGNOSIS — D631 Anemia in chronic kidney disease: Secondary | ICD-10-CM | POA: Diagnosis not present

## 2014-03-25 DIAGNOSIS — N186 End stage renal disease: Secondary | ICD-10-CM | POA: Diagnosis not present

## 2014-03-27 DIAGNOSIS — N186 End stage renal disease: Secondary | ICD-10-CM | POA: Diagnosis not present

## 2014-03-27 DIAGNOSIS — E1129 Type 2 diabetes mellitus with other diabetic kidney complication: Secondary | ICD-10-CM | POA: Diagnosis not present

## 2014-03-27 DIAGNOSIS — D631 Anemia in chronic kidney disease: Secondary | ICD-10-CM | POA: Diagnosis not present

## 2014-03-27 DIAGNOSIS — D509 Iron deficiency anemia, unspecified: Secondary | ICD-10-CM | POA: Diagnosis not present

## 2014-03-28 DIAGNOSIS — N186 End stage renal disease: Secondary | ICD-10-CM | POA: Diagnosis not present

## 2014-03-28 DIAGNOSIS — Z992 Dependence on renal dialysis: Secondary | ICD-10-CM | POA: Diagnosis not present

## 2014-03-30 DIAGNOSIS — E1129 Type 2 diabetes mellitus with other diabetic kidney complication: Secondary | ICD-10-CM | POA: Diagnosis not present

## 2014-03-30 DIAGNOSIS — D509 Iron deficiency anemia, unspecified: Secondary | ICD-10-CM | POA: Diagnosis not present

## 2014-03-30 DIAGNOSIS — N186 End stage renal disease: Secondary | ICD-10-CM | POA: Diagnosis not present

## 2014-04-03 ENCOUNTER — Emergency Department (HOSPITAL_COMMUNITY): Payer: Medicare Other

## 2014-04-03 ENCOUNTER — Inpatient Hospital Stay (HOSPITAL_COMMUNITY)
Admission: EM | Admit: 2014-04-03 | Discharge: 2014-04-06 | DRG: 202 | Disposition: A | Discharge status: Home / Self Care | Payer: Medicare Other | Attending: Internal Medicine | Admitting: Internal Medicine

## 2014-04-03 ENCOUNTER — Encounter (HOSPITAL_COMMUNITY): Payer: Self-pay | Admitting: Emergency Medicine

## 2014-04-03 DIAGNOSIS — I959 Hypotension, unspecified: Secondary | ICD-10-CM | POA: Diagnosis present

## 2014-04-03 DIAGNOSIS — I2510 Atherosclerotic heart disease of native coronary artery without angina pectoris: Secondary | ICD-10-CM | POA: Diagnosis present

## 2014-04-03 DIAGNOSIS — R072 Precordial pain: Secondary | ICD-10-CM | POA: Diagnosis not present

## 2014-04-03 DIAGNOSIS — R079 Chest pain, unspecified: Secondary | ICD-10-CM | POA: Diagnosis not present

## 2014-04-03 DIAGNOSIS — H3530 Unspecified macular degeneration: Secondary | ICD-10-CM | POA: Diagnosis present

## 2014-04-03 DIAGNOSIS — N2581 Secondary hyperparathyroidism of renal origin: Secondary | ICD-10-CM | POA: Diagnosis present

## 2014-04-03 DIAGNOSIS — Z8673 Personal history of transient ischemic attack (TIA), and cerebral infarction without residual deficits: Secondary | ICD-10-CM

## 2014-04-03 DIAGNOSIS — D631 Anemia in chronic kidney disease: Secondary | ICD-10-CM | POA: Diagnosis present

## 2014-04-03 DIAGNOSIS — I480 Paroxysmal atrial fibrillation: Secondary | ICD-10-CM | POA: Diagnosis present

## 2014-04-03 DIAGNOSIS — I359 Nonrheumatic aortic valve disorder, unspecified: Secondary | ICD-10-CM | POA: Diagnosis not present

## 2014-04-03 DIAGNOSIS — Z992 Dependence on renal dialysis: Secondary | ICD-10-CM

## 2014-04-03 DIAGNOSIS — I700 Atherosclerosis of aorta: Secondary | ICD-10-CM | POA: Diagnosis present

## 2014-04-03 DIAGNOSIS — F329 Major depressive disorder, single episode, unspecified: Secondary | ICD-10-CM | POA: Diagnosis present

## 2014-04-03 DIAGNOSIS — J9811 Atelectasis: Secondary | ICD-10-CM | POA: Diagnosis present

## 2014-04-03 DIAGNOSIS — Z7982 Long term (current) use of aspirin: Secondary | ICD-10-CM | POA: Diagnosis not present

## 2014-04-03 DIAGNOSIS — Z87891 Personal history of nicotine dependence: Secondary | ICD-10-CM

## 2014-04-03 DIAGNOSIS — N186 End stage renal disease: Secondary | ICD-10-CM | POA: Diagnosis present

## 2014-04-03 DIAGNOSIS — I272 Other secondary pulmonary hypertension: Secondary | ICD-10-CM | POA: Diagnosis present

## 2014-04-03 DIAGNOSIS — K219 Gastro-esophageal reflux disease without esophagitis: Secondary | ICD-10-CM | POA: Diagnosis present

## 2014-04-03 DIAGNOSIS — K2270 Barrett's esophagus without dysplasia: Secondary | ICD-10-CM | POA: Diagnosis present

## 2014-04-03 DIAGNOSIS — J209 Acute bronchitis, unspecified: Secondary | ICD-10-CM | POA: Diagnosis not present

## 2014-04-03 DIAGNOSIS — I951 Orthostatic hypotension: Secondary | ICD-10-CM | POA: Diagnosis not present

## 2014-04-03 DIAGNOSIS — I120 Hypertensive chronic kidney disease with stage 5 chronic kidney disease or end stage renal disease: Secondary | ICD-10-CM | POA: Diagnosis present

## 2014-04-03 DIAGNOSIS — D649 Anemia, unspecified: Secondary | ICD-10-CM | POA: Diagnosis present

## 2014-04-03 DIAGNOSIS — E785 Hyperlipidemia, unspecified: Secondary | ICD-10-CM | POA: Diagnosis present

## 2014-04-03 DIAGNOSIS — I350 Nonrheumatic aortic (valve) stenosis: Secondary | ICD-10-CM | POA: Diagnosis present

## 2014-04-03 DIAGNOSIS — R918 Other nonspecific abnormal finding of lung field: Secondary | ICD-10-CM | POA: Diagnosis not present

## 2014-04-03 DIAGNOSIS — R071 Chest pain on breathing: Secondary | ICD-10-CM | POA: Diagnosis not present

## 2014-04-03 HISTORY — DX: Type 2 diabetes mellitus without complications: E11.9

## 2014-04-03 LAB — CBC
HCT: 37.9 % — ABNORMAL LOW (ref 39.0–52.0)
Hemoglobin: 12.6 g/dL — ABNORMAL LOW (ref 13.0–17.0)
MCH: 34.4 pg — ABNORMAL HIGH (ref 26.0–34.0)
MCHC: 33.2 g/dL (ref 30.0–36.0)
MCV: 103.6 fL — AB (ref 78.0–100.0)
PLATELETS: 178 10*3/uL (ref 150–400)
RBC: 3.66 MIL/uL — AB (ref 4.22–5.81)
RDW: 15.2 % (ref 11.5–15.5)
WBC: 15.5 10*3/uL — AB (ref 4.0–10.5)

## 2014-04-03 LAB — COMPREHENSIVE METABOLIC PANEL
ALT: 19 U/L (ref 0–53)
AST: 23 U/L (ref 0–37)
Albumin: 3.6 g/dL (ref 3.5–5.2)
Alkaline Phosphatase: 79 U/L (ref 39–117)
Anion gap: 14 (ref 5–15)
BUN: 20 mg/dL (ref 6–23)
CO2: 23 mmol/L (ref 19–32)
CREATININE: 4.47 mg/dL — AB (ref 0.50–1.35)
Calcium: 8.7 mg/dL (ref 8.4–10.5)
Chloride: 100 mEq/L (ref 96–112)
GFR calc Af Amer: 14 mL/min — ABNORMAL LOW (ref >90)
GFR calc non Af Amer: 12 mL/min — ABNORMAL LOW (ref >90)
Glucose, Bld: 133 mg/dL — ABNORMAL HIGH (ref 70–99)
Potassium: 3.4 mmol/L — ABNORMAL LOW (ref 3.5–5.1)
Sodium: 137 mmol/L (ref 135–145)
TOTAL PROTEIN: 7.2 g/dL (ref 6.0–8.3)
Total Bilirubin: 0.7 mg/dL (ref 0.3–1.2)

## 2014-04-03 LAB — TROPONIN I

## 2014-04-03 MED ORDER — ASPIRIN 325 MG PO TABS
325.0000 mg | ORAL_TABLET | Freq: Once | ORAL | Status: AC
  Administered 2014-04-03: 325 mg via ORAL
  Filled 2014-04-03: qty 1

## 2014-04-03 MED ORDER — IOHEXOL 350 MG/ML SOLN
100.0000 mL | Freq: Once | INTRAVENOUS | Status: AC | PRN
  Administered 2014-04-03: 100 mL via INTRAVENOUS

## 2014-04-03 NOTE — ED Provider Notes (Signed)
CSN: [id number]     Arrival date & time 04/03/14  1915 History   First MD Initiated Contact with Patient 04/03/14 1937     Chief Complaint  Patient presents with  . Chest Pain     (Consider location/radiation/quality/duration/timing/severity/associated sxs/prior Treatment) Patient is a 74 y.o. male presenting with chest pain.  Chest Pain Pain location:  Substernal area Pain quality: pressure and tightness   Pain radiates to:  Does not radiate Pain radiates to the back: no   Pain severity:  Moderate Onset quality:  Gradual Duration:  1 day Timing:  Constant Progression:  Worsening Chronicity:  New Context: breathing and at rest   Relieved by:  Nothing Worsened by:  Nothing tried Ineffective treatments:  None tried Associated symptoms: shortness of breath   Associated symptoms: no abdominal pain, no fever, no nausea and not vomiting     Past Medical History  Diagnosis Date  . End stage renal disease     hemodialysis 3 times a week  . Seasonal allergies   . Hyperlipidemia   . Anemia   . Depression   . Macular degeneration     both eyes  . GERD (gastroesophageal reflux disease)   . Hypertension   . CVA (cerebral infarction)     2004/affected left side  . Peptic ulcer     bleeding, 1969  . Kidney stones   . Renal insufficiency   . Diverticulosis   . Tubular adenoma of colon 07/2001  . Barrett's esophagus 05/2003  . Stroke 2004  . Colon polyps   . Renal failure   . Hemodialysis patient   . Aortic stenosis 06/15/12    TEE - EF 09-38%; grade 1 diastolic dysfunction; mild/mod aortic valve stenosis; Mitral valve had calcified annulus, mild pulm htn PA peak pressure 61mmHg  . Atrial fibrillation 04/01/10    14 day event monitor - some sinus bradycardia, PACs and sinus tachycardia  . Atrial fibrillation 11/03/09    R/P MV - EF 66%; normal myocardial perfusion study, w/o chest pain or EKG changes for ischemia  . S/P epidural steroid injection     last  injection over 10 years  ago   Past Surgical History  Procedure Laterality Date  . Av fistula placement  2009  . Laminectomy  1969  . Tonsillectomy  1964  . 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 Cystoscopy  several times    kidney stones  . Back surgery     Family History  Problem Relation Age of Onset  . Colon cancer Neg Hx   . Stomach cancer Mother   . Hypertension Father     Died of heart attack   History  Substance Use Topics  . Smoking status: Former Smoker    Quit date: 01/20/1998  . Smokeless tobacco: Never Used  . Alcohol Use: No    Review of Systems  Constitutional: Negative for fever.  Respiratory: Positive for shortness of breath.   Cardiovascular: Positive for chest pain.  Gastrointestinal: Negative for nausea, vomiting and abdominal pain.  All other systems reviewed and are negative.     Allergies  Penicillins  Home Medications   Prior to Admission medications   Medication Sig Start Date End Date Taking? Authorizing Provider  acetaminophen (TYLENOL) 500 MG tablet Take 1,000 mg by mouth every 6 (six) hours as needed for pain.   Yes Historical Provider, MD  amiodarone (PACERONE) 200 MG tablet Take 1 tablet (200 mg total) by mouth daily.  02/05/14  Yes Pixie Casino, MD  aspirin 81 MG tablet Take 81 mg by mouth daily.    Yes Historical Provider, MD  atorvastatin (LIPITOR) 40 MG tablet Take 20 mg by mouth daily.   Yes Historical Provider, MD  b complex-vitamin c-folic acid (NEPHRO-VITE) 0.8 MG TABS Take 0.8 mg by mouth daily.    Yes Historical Provider, MD  bromocriptine (PARLODEL) 5 MG capsule Take 5 mg by mouth daily.  11/30/10  Yes Historical Provider, MD  calcium acetate (PHOSLO) 667 MG capsule Take 1,334 mg by mouth 3 (three) times daily with meals.   Yes Historical Provider, MD  cinacalcet (SENSIPAR) 30 MG tablet Take 30 mg by mouth daily.   Yes Historical Provider, MD  dextromethorphan 15 MG/5ML syrup Take 10 mLs by mouth 4 (four) times daily as needed for cough.   Yes  Historical Provider, MD  dorzolamide-timolol (COSOPT) 22.3-6.8 MG/ML ophthalmic solution Place 1 drop into the right eye 2 (two) times daily. 08/13/13  Yes Historical Provider, MD  FLUoxetine (PROZAC) 20 MG capsule Take 40 mg by mouth daily.  11/30/10  Yes Historical Provider, MD  latanoprost (XALATAN) 0.005 % ophthalmic solution Place 1 drop into both eyes at bedtime. 02/13/13  Yes Historical Provider, MD  omeprazole (PRILOSEC) 20 MG capsule Take 20 mg by mouth daily.     Yes Historical Provider, MD  Cetirizine HCl 10 MG CAPS Take by mouth daily.    Historical Provider, MD   BP 91/62 mmHg  Pulse 93  Temp(Src) 98.3 F (36.8 C) (Oral)  Resp 22  SpO2 95% Physical Exam  Constitutional: He is oriented to person, place, and time. He appears well-developed and well-nourished.  HENT:  Head: Normocephalic and atraumatic.  Eyes: Conjunctivae and EOM are normal.  Neck: Normal range of motion. Neck supple.  Cardiovascular: Normal rate, regular rhythm and normal heart sounds.   Pulmonary/Chest: Effort normal and breath sounds normal. No respiratory distress.  Abdominal: He exhibits no distension. There is no tenderness. There is no rebound and no guarding.  Musculoskeletal: Normal range of motion.  Neurological: He is alert and oriented to person, place, and time.  Skin: Skin is warm and dry.  Vitals reviewed.   ED Course  Procedures (including critical care time) Labs Review Labs Reviewed  CBC - Abnormal; Notable for the following:    WBC 15.5 (*)    RBC 3.66 (*)    Hemoglobin 12.6 (*)    HCT 37.9 (*)    MCV 103.6 (*)    MCH 34.4 (*)    All other components within normal limits  COMPREHENSIVE METABOLIC PANEL - Abnormal; Notable for the following:    Potassium 3.4 (*)    Glucose, Bld 133 (*)    Creatinine, Ser 4.47 (*)    GFR calc non Af Amer 12 (*)    GFR calc Af Amer 14 (*)    All other components within normal limits  TROPONIN I - Abnormal; Notable for the following:    Troponin I  0.04 (*)    All other components within normal limits  CBC - Abnormal; Notable for the following:    WBC 14.6 (*)    RBC 3.44 (*)    Hemoglobin 11.9 (*)    HCT 35.4 (*)    MCV 102.9 (*)    MCH 34.6 (*)    All other components within normal limits  CREATININE, SERUM - Abnormal; Notable for the following:    Creatinine, Ser 5.22 (*)  GFR calc non Af Amer 10 (*)    GFR calc Af Amer 11 (*)    All other components within normal limits  LIPID PANEL - Abnormal; Notable for the following:    HDL 35 (*)    All other components within normal limits  BRAIN NATRIURETIC PEPTIDE - Abnormal; Notable for the following:    B Natriuretic Peptide 571.9 (*)    All other components within normal limits  TROPONIN I  TROPONIN I  TROPONIN I  HEMOGLOBIN A1C    Imaging Review Dg Chest 2 View  04/03/2014   CLINICAL DATA:  Acute onset of mid sternal chest pain and epigastric pain. Initial encounter.  EXAM: CHEST  2 VIEW  COMPARISON:  Chest radiograph performed 03/09/2011  FINDINGS: The lungs are well-aerated. Mild left basilar opacity may reflect atelectasis or mild pneumonia. There is no evidence of pleural effusion or pneumothorax.  The heart is mildly enlarged. There is a unusual contour to the right paraspinal region at the lower thoracic spine and to the descending thoracic aortic contour, without definite evidence of a hiatal hernia on the lateral view. The thoracic aorta appears mildly more prominent on the frontal view in comparison to 2012. No acute osseous abnormalities are seen.  IMPRESSION: 1. Mild left basilar opacity may reflect atelectasis or mild pneumonia. 2. Unusual contour to the right paraspinal region at the lower thoracic spine, and to the descending thoracic aortic contour, without definite evidence of a hiatal hernia on the lateral view. The thoracic aorta appears mildly more prominent on the current study. CT of the chest would be helpful for further evaluation, to exclude underlying mass  or thoracic aortic aneurysm, as deemed clinically appropriate.   Electronically Signed   By: Garald Balding M.D.   On: 04/03/2014 21:40   Ct Angio Chest Aorta W/cm &/or Wo/cm  04/03/2014   CLINICAL DATA:  Of patient complains of central non radiating sharp and stabbing chest pain with shortness of breath. Chest pain began this morning. Patient went in dialysis and chest pain subsided. After dialysis, chest pain began again. History of stroke.  EXAM: CT ANGIOGRAPHY CHEST, ABDOMEN AND PELVIS  TECHNIQUE: Multidetector CT imaging through the chest, abdomen and pelvis was performed using the standard protocol during bolus administration of intravenous contrast. Multiplanar reconstructed images and MIPs were obtained and reviewed to evaluate the vascular anatomy.  CONTRAST:  168mL OMNIPAQUE IOHEXOL 350 MG/ML SOLN  COMPARISON:  CT abdomen and pelvis 01/28/2011  FINDINGS: CTA CHEST FINDINGS  Noncontrast images of the chest demonstrate calcification of the thoracic aorta and Coronary arteries as well as base of great vessels. Calcification in the visualized abdominal aorta and splenic artery. No evidence of intramural hematoma.  On views obtained during arterial phase after intravenous contrast injection are somewhat limited due to motion artifact. However, the aorta is normal in caliber without evidence of aneurysm. No evidence of aortic dissection, allowing for motion artifact. Central pulmonary arteries are opacified without evidence of significant pulmonary embolus.  Normal heart size. Great vessel origins are patent. Scattered mediastinal lymph nodes are not pathologically enlarged. Esophagus is decompressed. Sub cm hypo enhancing lesion in the left lobe of the thyroid. Thyroid gland is otherwise normal in size.  Evaluation of lungs is limited due to motion artifact. Probable dependent atelectasis in the lung bases. No focal consolidation is suggested. No pleural effusions. No pneumothorax. Airways appear patent.   Review of the MIP images confirms the above findings.  CTA ABDOMEN AND PELVIS FINDINGS  Abdominal aorta demonstrates diffuse calcification. No aortic aneurysm or dissection. The abdominal aorta, celiac axis, superior mesenteric artery, single bilateral renal arteries, inferior mesenteric artery, and bilateral iliac, external iliac, internal iliac, and common femoral arteries are patent although there is diffuse atherosclerotic change throughout. Renal nephrograms are symmetrical.  The liver, spleen, gallbladder, pancreas, adrenal glands, inferior vena cava, and retroperitoneal lymph nodes are unremarkable. Both kidneys demonstrate diffuse parenchymal atrophy with subcentimeter cysts. No hydronephrosis or hydroureter. Stomach and small bowel are decompressed. Stool-filled colon without abnormal distention. No free air or free fluid in the abdomen.  Pelvis: The appendix is normal. Prostate gland is mildly enlarged, measuring 4.5 x 3.5 cm. Bladder wall is mildly thickened, possibly due to under distention. Bladder infection or bowel obstruction not excluded. Diverticulosis of the sigmoid colon without evidence of diverticulitis. No free or loculated pelvic fluid collections. No pelvic mass or lymphadenopathy. Small right inguinal hernia containing fat.  Bones: Degenerative changes throughout the thoracic and lumbar spine. Normal alignment. Degenerative changes in the shoulders. No destructive bone lesions are identified. No acute displaced fractures are suggested.  Review of the MIP images confirms the above findings.  IMPRESSION: No evidence of aneurysm or dissection in the thoracic or abdominal aorta. Diffuse atherosclerotic changes throughout. No definite evidence of any active pulmonary disease. No focal acute process demonstrated in the abdomen or pelvis. Prostate gland is enlarged. Small right inguinal hernia containing fat. Diffuse parenchymal atrophy in both kidneys.   Electronically Signed   By: Lucienne Capers M.D.   On: 04/03/2014 23:30   Ct Angio Abd/pel W/ And/or W/o  04/03/2014   CLINICAL DATA:  Of patient complains of central non radiating sharp and stabbing chest pain with shortness of breath. Chest pain began this morning. Patient went in dialysis and chest pain subsided. After dialysis, chest pain began again. History of stroke.  EXAM: CT ANGIOGRAPHY CHEST, ABDOMEN AND PELVIS  TECHNIQUE: Multidetector CT imaging through the chest, abdomen and pelvis was performed using the standard protocol during bolus administration of intravenous contrast. Multiplanar reconstructed images and MIPs were obtained and reviewed to evaluate the vascular anatomy.  CONTRAST:  180mL OMNIPAQUE IOHEXOL 350 MG/ML SOLN  COMPARISON:  CT abdomen and pelvis 01/28/2011  FINDINGS: CTA CHEST FINDINGS  Noncontrast images of the chest demonstrate calcification of the thoracic aorta and Coronary arteries as well as base of great vessels. Calcification in the visualized abdominal aorta and splenic artery. No evidence of intramural hematoma.  On views obtained during arterial phase after intravenous contrast injection are somewhat limited due to motion artifact. However, the aorta is normal in caliber without evidence of aneurysm. No evidence of aortic dissection, allowing for motion artifact. Central pulmonary arteries are opacified without evidence of significant pulmonary embolus.  Normal heart size. Great vessel origins are patent. Scattered mediastinal lymph nodes are not pathologically enlarged. Esophagus is decompressed. Sub cm hypo enhancing lesion in the left lobe of the thyroid. Thyroid gland is otherwise normal in size.  Evaluation of lungs is limited due to motion artifact. Probable dependent atelectasis in the lung bases. No focal consolidation is suggested. No pleural effusions. No pneumothorax. Airways appear patent.  Review of the MIP images confirms the above findings.  CTA ABDOMEN AND PELVIS FINDINGS  Abdominal aorta  demonstrates diffuse calcification. No aortic aneurysm or dissection. The abdominal aorta, celiac axis, superior mesenteric artery, single bilateral renal arteries, inferior mesenteric artery, and bilateral iliac, external iliac, internal iliac, and common femoral arteries are patent although there is  diffuse atherosclerotic change throughout. Renal nephrograms are symmetrical.  The liver, spleen, gallbladder, pancreas, adrenal glands, inferior vena cava, and retroperitoneal lymph nodes are unremarkable. Both kidneys demonstrate diffuse parenchymal atrophy with subcentimeter cysts. No hydronephrosis or hydroureter. Stomach and small bowel are decompressed. Stool-filled colon without abnormal distention. No free air or free fluid in the abdomen.  Pelvis: The appendix is normal. Prostate gland is mildly enlarged, measuring 4.5 x 3.5 cm. Bladder wall is mildly thickened, possibly due to under distention. Bladder infection or bowel obstruction not excluded. Diverticulosis of the sigmoid colon without evidence of diverticulitis. No free or loculated pelvic fluid collections. No pelvic mass or lymphadenopathy. Small right inguinal hernia containing fat.  Bones: Degenerative changes throughout the thoracic and lumbar spine. Normal alignment. Degenerative changes in the shoulders. No destructive bone lesions are identified. No acute displaced fractures are suggested.  Review of the MIP images confirms the above findings.  IMPRESSION: No evidence of aneurysm or dissection in the thoracic or abdominal aorta. Diffuse atherosclerotic changes throughout. No definite evidence of any active pulmonary disease. No focal acute process demonstrated in the abdomen or pelvis. Prostate gland is enlarged. Small right inguinal hernia containing fat. Diffuse parenchymal atrophy in both kidneys.   Electronically Signed   By: Lucienne Capers M.D.   On: 04/03/2014 23:30     EKG Interpretation   Date/Time:  Wednesday April 03 2014  19:21:01 EST Ventricular Rate:  66 PR Interval:    QRS Duration: 103 QT Interval:  611 QTC Calculation: 640 R Axis:   -32 Text Interpretation:  Normal sinus rhythm Prolonged QT interval Baseline  wander in lead(s) II III aVF No significant change since last tracing  Confirmed by Debby Freiberg [phone]) on 04/03/2014 8:17:34 PM      MDM   Final diagnoses:  Chest pain    74 y.o. male with pertinent PMH of prior CVA with very minimal L sided residual deficit, ESRD on MWF dialysis presents with new onset chest pain concerning for unstable angina.  First noted this am, then resolved.  Pt then had recurrence which has persisted until now.  Elba Barman today remarkable for prominence of aortic shadow on xr, so ct scan obtained and unremarkable.  Admitted for ACS ro.    I have reviewed all laboratory and imaging studies if ordered as above  1. Chest pain         Debby Freiberg, MD 04/04/14 1136

## 2014-04-03 NOTE — ED Notes (Signed)
Per EMS, pt from home c/o of 8/10 central non-radiating "sharp and stabbing" CP with mild SOB. Pt states CP began this morning, he went to dialysis and CP subsided. After dialysis CP began again. Pt denies dizziness, N/V. Pt has hx of stroke so he is mildly dysphagic at baseline. LT pupil is larger than RT, per EMS this is also baseline. Pt legally blind. NAD noted. VSS.

## 2014-04-03 NOTE — ED Notes (Addendum)
Pt 02 saturation 88% when resting. Placed pt on 2L [place]. O2 saturation 100% on 2L.

## 2014-04-03 NOTE — ED Notes (Signed)
MD Gentry at bedside.

## 2014-04-04 ENCOUNTER — Encounter (HOSPITAL_COMMUNITY): Payer: Self-pay | Admitting: General Practice

## 2014-04-04 DIAGNOSIS — R071 Chest pain on breathing: Secondary | ICD-10-CM

## 2014-04-04 DIAGNOSIS — Z992 Dependence on renal dialysis: Secondary | ICD-10-CM

## 2014-04-04 DIAGNOSIS — I951 Orthostatic hypotension: Secondary | ICD-10-CM | POA: Diagnosis not present

## 2014-04-04 DIAGNOSIS — Z7982 Long term (current) use of aspirin: Secondary | ICD-10-CM | POA: Diagnosis not present

## 2014-04-04 DIAGNOSIS — N186 End stage renal disease: Secondary | ICD-10-CM | POA: Diagnosis not present

## 2014-04-04 DIAGNOSIS — Z87891 Personal history of nicotine dependence: Secondary | ICD-10-CM | POA: Diagnosis not present

## 2014-04-04 DIAGNOSIS — R072 Precordial pain: Secondary | ICD-10-CM | POA: Diagnosis not present

## 2014-04-04 DIAGNOSIS — E785 Hyperlipidemia, unspecified: Secondary | ICD-10-CM | POA: Diagnosis not present

## 2014-04-04 DIAGNOSIS — I359 Nonrheumatic aortic valve disorder, unspecified: Secondary | ICD-10-CM | POA: Diagnosis not present

## 2014-04-04 DIAGNOSIS — Z8673 Personal history of transient ischemic attack (TIA), and cerebral infarction without residual deficits: Secondary | ICD-10-CM | POA: Diagnosis not present

## 2014-04-04 DIAGNOSIS — I2510 Atherosclerotic heart disease of native coronary artery without angina pectoris: Secondary | ICD-10-CM | POA: Diagnosis present

## 2014-04-04 DIAGNOSIS — K2270 Barrett's esophagus without dysplasia: Secondary | ICD-10-CM

## 2014-04-04 DIAGNOSIS — D631 Anemia in chronic kidney disease: Secondary | ICD-10-CM | POA: Diagnosis not present

## 2014-04-04 DIAGNOSIS — K219 Gastro-esophageal reflux disease without esophagitis: Secondary | ICD-10-CM

## 2014-04-04 DIAGNOSIS — I700 Atherosclerosis of aorta: Secondary | ICD-10-CM | POA: Diagnosis present

## 2014-04-04 DIAGNOSIS — R079 Chest pain, unspecified: Secondary | ICD-10-CM

## 2014-04-04 DIAGNOSIS — D649 Anemia, unspecified: Secondary | ICD-10-CM | POA: Diagnosis present

## 2014-04-04 DIAGNOSIS — I480 Paroxysmal atrial fibrillation: Secondary | ICD-10-CM | POA: Diagnosis not present

## 2014-04-04 DIAGNOSIS — I350 Nonrheumatic aortic (valve) stenosis: Secondary | ICD-10-CM | POA: Diagnosis present

## 2014-04-04 DIAGNOSIS — I959 Hypotension, unspecified: Secondary | ICD-10-CM | POA: Diagnosis not present

## 2014-04-04 DIAGNOSIS — N2581 Secondary hyperparathyroidism of renal origin: Secondary | ICD-10-CM | POA: Diagnosis present

## 2014-04-04 DIAGNOSIS — I272 Other secondary pulmonary hypertension: Secondary | ICD-10-CM | POA: Diagnosis present

## 2014-04-04 DIAGNOSIS — F329 Major depressive disorder, single episode, unspecified: Secondary | ICD-10-CM | POA: Diagnosis present

## 2014-04-04 DIAGNOSIS — J9811 Atelectasis: Secondary | ICD-10-CM | POA: Diagnosis present

## 2014-04-04 DIAGNOSIS — J209 Acute bronchitis, unspecified: Secondary | ICD-10-CM | POA: Diagnosis not present

## 2014-04-04 DIAGNOSIS — I120 Hypertensive chronic kidney disease with stage 5 chronic kidney disease or end stage renal disease: Secondary | ICD-10-CM | POA: Diagnosis not present

## 2014-04-04 DIAGNOSIS — H3530 Unspecified macular degeneration: Secondary | ICD-10-CM | POA: Diagnosis present

## 2014-04-04 LAB — CBC
HEMATOCRIT: 35.4 % — AB (ref 39.0–52.0)
HEMOGLOBIN: 11.9 g/dL — AB (ref 13.0–17.0)
MCH: 34.6 pg — AB (ref 26.0–34.0)
MCHC: 33.6 g/dL (ref 30.0–36.0)
MCV: 102.9 fL — ABNORMAL HIGH (ref 78.0–100.0)
Platelets: 163 10*3/uL (ref 150–400)
RBC: 3.44 MIL/uL — AB (ref 4.22–5.81)
RDW: 15.5 % (ref 11.5–15.5)
WBC: 14.6 10*3/uL — ABNORMAL HIGH (ref 4.0–10.5)

## 2014-04-04 LAB — MRSA PCR SCREENING: MRSA by PCR: NEGATIVE

## 2014-04-04 LAB — LIPID PANEL
Cholesterol: 104 mg/dL (ref 0–200)
HDL: 35 mg/dL — ABNORMAL LOW
LDL Cholesterol: 55 mg/dL (ref 0–99)
Total CHOL/HDL Ratio: 3 ratio
Triglycerides: 68 mg/dL
VLDL: 14 mg/dL (ref 0–40)

## 2014-04-04 LAB — TROPONIN I
TROPONIN I: 0.04 ng/mL — AB (ref <0.031)
TROPONIN I: 0.03 ng/mL (ref <0.031)
Troponin I: 0.03 ng/mL (ref <0.031)
Troponin I: 0.03 ng/mL (ref <0.031)
Troponin I: 0.03 ng/mL (ref <0.031)

## 2014-04-04 LAB — HEMOGLOBIN A1C
Hgb A1c MFr Bld: 5.3 %
Mean Plasma Glucose: 105 mg/dL

## 2014-04-04 LAB — BRAIN NATRIURETIC PEPTIDE: B NATRIURETIC PEPTIDE 5: 571.9 pg/mL — AB (ref 0.0–100.0)

## 2014-04-04 LAB — CREATININE, SERUM
CREATININE: 5.22 mg/dL — AB (ref 0.50–1.35)
GFR calc Af Amer: 11 mL/min — ABNORMAL LOW (ref >90)
GFR, EST NON AFRICAN AMERICAN: 10 mL/min — AB (ref >90)

## 2014-04-04 LAB — CORTISOL: Cortisol, Plasma: 40.4 ug/dL

## 2014-04-04 LAB — TSH: TSH: 1.721 u[IU]/mL (ref 0.350–4.500)

## 2014-04-04 MED ORDER — FLUOXETINE HCL 20 MG PO CAPS
40.0000 mg | ORAL_CAPSULE | Freq: Every day | ORAL | Status: DC
  Administered 2014-04-04 – 2014-04-06 (×3): 40 mg via ORAL
  Filled 2014-04-04 (×3): qty 2

## 2014-04-04 MED ORDER — MORPHINE SULFATE 2 MG/ML IJ SOLN
2.0000 mg | INTRAMUSCULAR | Status: DC | PRN
  Administered 2014-04-04 (×2): 2 mg via INTRAVENOUS
  Filled 2014-04-04 (×3): qty 1

## 2014-04-04 MED ORDER — FENTANYL CITRATE 0.05 MG/ML IJ SOLN: 25.0000 ug | INTRAMUSCULAR | Status: DC | PRN

## 2014-04-04 MED ORDER — ONDANSETRON HCL 4 MG/2ML IJ SOLN
4.0000 mg | Freq: Four times a day (QID) | INTRAMUSCULAR | Status: DC | PRN
  Administered 2014-04-05 – 2014-04-06 (×2): 4 mg via INTRAVENOUS
  Filled 2014-04-04 (×2): qty 2

## 2014-04-04 MED ORDER — PANTOPRAZOLE SODIUM 40 MG IV SOLR: 40.0000 mg | Freq: Two times a day (BID) | INTRAVENOUS | Status: DC

## 2014-04-04 MED ORDER — DORZOLAMIDE HCL-TIMOLOL MAL 2-0.5 % OP SOLN
1.0000 [drp] | Freq: Two times a day (BID) | OPHTHALMIC | Status: DC
  Administered 2014-04-04 – 2014-04-06 (×5): 1 [drp] via OPHTHALMIC
  Filled 2014-04-04: qty 10

## 2014-04-04 MED ORDER — ASPIRIN EC 325 MG PO TBEC
325.0000 mg | DELAYED_RELEASE_TABLET | Freq: Every day | ORAL | Status: DC
  Administered 2014-04-04: 325 mg via ORAL
  Filled 2014-04-04 (×2): qty 1

## 2014-04-04 MED ORDER — DEXTROMETHORPHAN POLISTIREX 30 MG/5ML PO LQCR
30.0000 mg | Freq: Two times a day (BID) | ORAL | Status: DC | PRN
  Filled 2014-04-04: qty 5

## 2014-04-04 MED ORDER — CINACALCET HCL 30 MG PO TABS
30.0000 mg | ORAL_TABLET | Freq: Every day | ORAL | Status: DC
  Administered 2014-04-04 – 2014-04-06 (×3): 30 mg via ORAL
  Filled 2014-04-04 (×4): qty 1

## 2014-04-04 MED ORDER — HEPARIN SODIUM (PORCINE) 5000 UNIT/ML IJ SOLN
5000.0000 [IU] | Freq: Three times a day (TID) | INTRAMUSCULAR | Status: DC
  Administered 2014-04-04 – 2014-04-06 (×7): 5000 [IU] via SUBCUTANEOUS
  Filled 2014-04-04 (×8): qty 1

## 2014-04-04 MED ORDER — ATORVASTATIN CALCIUM 20 MG PO TABS
20.0000 mg | ORAL_TABLET | Freq: Every day | ORAL | Status: DC
  Administered 2014-04-04 – 2014-04-05 (×2): 20 mg via ORAL
  Filled 2014-04-04 (×3): qty 1

## 2014-04-04 MED ORDER — MIDODRINE HCL 5 MG PO TABS
10.0000 mg | ORAL_TABLET | Freq: Every day | ORAL | Status: DC
  Administered 2014-04-04 – 2014-04-06 (×3): 10 mg via ORAL
  Filled 2014-04-04 (×2): qty 2

## 2014-04-04 MED ORDER — AMIODARONE HCL 200 MG PO TABS
200.0000 mg | ORAL_TABLET | Freq: Every day | ORAL | Status: DC
  Administered 2014-04-04 – 2014-04-06 (×3): 200 mg via ORAL
  Filled 2014-04-04 (×3): qty 1

## 2014-04-04 MED ORDER — DEXTROMETHORPHAN HBR 15 MG/5ML PO SYRP: 10.0000 mL | ORAL_SOLUTION | Freq: Four times a day (QID) | ORAL | Status: DC | PRN

## 2014-04-04 MED ORDER — ASPIRIN EC 81 MG PO TBEC: 81.0000 mg | DELAYED_RELEASE_TABLET | Freq: Every day | ORAL | Status: DC

## 2014-04-04 MED ORDER — SODIUM CHLORIDE 0.9 % IV SOLN
INTRAVENOUS | Status: DC
  Administered 2014-04-04: 100 mL/h via INTRAVENOUS

## 2014-04-04 MED ORDER — CALCIUM ACETATE 667 MG PO CAPS
1334.0000 mg | ORAL_CAPSULE | Freq: Three times a day (TID) | ORAL | Status: DC
  Administered 2014-04-04 – 2014-04-06 (×6): 1334 mg via ORAL
  Filled 2014-04-04 (×10): qty 2

## 2014-04-04 MED ORDER — LATANOPROST 0.005 % OP SOLN
1.0000 [drp] | Freq: Every day | OPHTHALMIC | Status: DC
  Administered 2014-04-04 – 2014-04-05 (×2): 1 [drp] via OPHTHALMIC
  Filled 2014-04-04 (×2): qty 2.5

## 2014-04-04 MED ORDER — PANTOPRAZOLE SODIUM 40 MG PO TBEC
80.0000 mg | DELAYED_RELEASE_TABLET | Freq: Every day | ORAL | Status: DC
  Administered 2014-04-04: 80 mg via ORAL
  Filled 2014-04-04: qty 2

## 2014-04-04 MED ORDER — ACETAMINOPHEN 325 MG PO TABS: 650.0000 mg | ORAL_TABLET | ORAL | Status: DC | PRN

## 2014-04-04 MED ORDER — PANTOPRAZOLE SODIUM 40 MG PO TBEC
40.0000 mg | DELAYED_RELEASE_TABLET | Freq: Every day | ORAL | Status: DC
  Administered 2014-04-05: 40 mg via ORAL
  Filled 2014-04-04 (×2): qty 1

## 2014-04-04 MED ORDER — SODIUM CHLORIDE 0.9 % IV BOLUS (SEPSIS)
1000.0000 mL | Freq: Once | INTRAVENOUS | Status: AC
  Administered 2014-04-04: 1000 mL via INTRAVENOUS

## 2014-04-04 MED ORDER — ZOLPIDEM TARTRATE 5 MG PO TABS: 5.0000 mg | ORAL_TABLET | Freq: Every evening | ORAL | Status: DC | PRN

## 2014-04-04 MED ORDER — BROMOCRIPTINE MESYLATE 2.5 MG PO TABS
5.0000 mg | ORAL_TABLET | Freq: Every day | ORAL | Status: DC
  Administered 2014-04-04: 5 mg via ORAL
  Filled 2014-04-04 (×2): qty 2

## 2014-04-04 MED ORDER — GI COCKTAIL ~~LOC~~
30.0000 mL | Freq: Once | ORAL | Status: AC
  Administered 2014-04-04: 30 mL via ORAL

## 2014-04-04 NOTE — Consult Note (Signed)
Reason for Consult: chest pain Primary Cardiologist: [name] Referring Physician: Dr. Colin Rhein and Dr. Donato Heinz is an 74 y.o. male.  HPI: Mr. Dhanush Jokerst is a 74 yo man with PMH of ESRD on hemodialysis, dyslipidemia, atrial fibrillation on chronic amiodarone therapy, GERD, prior CVA/TIA, hypertension who presents with chest/upper abdominal discomfort. He characterizes the pain as tightness/pressure that is worse with deep breaths and coughing. In the ER he had a negative CTA for dissection and CT Abd/Pelvis for other etiologies unrevealing. He's been coughing for a few weeks and he cannot get everything up. He denies sick contacts. His chest/abdomen discomfort did not improve with morphine (although it made him feel slightly better). His initial cardiac biomarkers were negative and ECG unrevealing in the ER. He had a negative August 2014 nuclear stress test.   Past Medical History  Diagnosis Date  . End stage renal disease     hemodialysis 3 times a week  . Seasonal allergies   . Hyperlipidemia   . Anemia   . Depression   . Macular degeneration     both eyes  . GERD (gastroesophageal reflux disease)   . Hypertension   . CVA (cerebral infarction)     2004/affected left side  . Peptic ulcer     bleeding, 1969  . Kidney stones   . Renal insufficiency   . Diverticulosis   . Tubular adenoma of colon 07/2001  . Barrett's esophagus 05/2003  . Stroke 2004  . Colon polyps   . Renal failure   . Hemodialysis patient   . Aortic stenosis 06/15/12    TEE - EF 05-69%; grade 1 diastolic dysfunction; mild/mod aortic valve stenosis; Mitral valve had calcified annulus, mild pulm htn PA peak pressure 73mHg  . Atrial fibrillation 04/01/10    14 day event monitor - some sinus bradycardia, PACs and sinus tachycardia  . Atrial fibrillation 11/03/09    R/P MV - EF 66%; normal myocardial perfusion study, w/o chest pain or EKG changes for ischemia  . S/P epidural steroid injection     last   injection over 10 years ago    Past Surgical History  Procedure Laterality Date  . Av fistula placement  2009  . Laminectomy  1969  . Tonsillectomy  1964  . 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 Cystoscopy  several times    kidney stones  . Back surgery      Family History  Problem Relation Age of Onset  . Colon cancer Neg Hx   . Stomach cancer Mother   . Hypertension Father     Died of heart attack    Social History:  reports that he quit smoking about 16 years ago. He has never used smokeless tobacco. He reports that he does not drink alcohol or use illicit drugs.  Allergies:  Allergies  Allergen Reactions  . Penicillins Swelling    Medications: I have reviewed the patient's current medications. Prior to Admission:  (Not in a hospital admission) Scheduled:  Continuous:   Results for orders placed or performed during the hospital encounter of 04/03/14 (from the past 48 hour(s))  CBC     Status: Abnormal   Collection Time: 04/03/14  8:47 PM  Result Value Ref Range   WBC 15.5 (H) 4.0 - 10.5 K/uL   RBC 3.66 (L) 4.22 - 5.81 MIL/uL   Hemoglobin 12.6 (L) 13.0 - 17.0 g/dL   HCT 37.9 (L) 39.0 - 52.0 %  MCV 103.6 (H) 78.0 - 100.0 fL   MCH 34.4 (H) 26.0 - 34.0 pg   MCHC 33.2 30.0 - 36.0 g/dL   RDW 15.2 11.5 - 15.5 %   Platelets 178 150 - 400 K/uL  Troponin I     Status: None   Collection Time: 04/03/14  8:47 PM  Result Value Ref Range   Troponin I <0.03 <0.031 ng/mL    Comment:        NO INDICATION OF MYOCARDIAL INJURY. Please note change in reference range.   Comprehensive metabolic panel     Status: Abnormal   Collection Time: 04/03/14  8:47 PM  Result Value Ref Range   Sodium 137 135 - 145 mmol/L    Comment: Please note change in reference range.   Potassium 3.4 (L) 3.5 - 5.1 mmol/L    Comment: Please note change in reference range.   Chloride 100 96 - 112 mEq/L   CO2 23 19 - 32 mmol/L   Glucose, Bld 133 (H) 70 - 99 mg/dL   BUN 20 6 - 23 mg/dL    Creatinine, Ser 4.47 (H) 0.50 - 1.35 mg/dL   Calcium 8.7 8.4 - 10.5 mg/dL   Total Protein 7.2 6.0 - 8.3 g/dL   Albumin 3.6 3.5 - 5.2 g/dL   AST 23 0 - 37 U/L   ALT 19 0 - 53 U/L   Alkaline Phosphatase 79 39 - 117 U/L   Total Bilirubin 0.7 0.3 - 1.2 mg/dL   GFR calc non Af Amer 12 (L) >90 mL/min   GFR calc Af Amer 14 (L) >90 mL/min    Comment: (NOTE) The eGFR has been calculated using the CKD EPI equation. This calculation has not been validated in all clinical situations. eGFR's persistently <90 mL/min signify possible Chronic Kidney Disease.    Anion gap 14 5 - 15    Dg Chest 2 View  04/03/2014   CLINICAL DATA:  Acute onset of mid sternal chest pain and epigastric pain. Initial encounter.  EXAM: CHEST  2 VIEW  COMPARISON:  Chest radiograph performed 03/09/2011  FINDINGS: The lungs are well-aerated. Mild left basilar opacity may reflect atelectasis or mild pneumonia. There is no evidence of pleural effusion or pneumothorax.  The heart is mildly enlarged. There is a unusual contour to the right paraspinal region at the lower thoracic spine and to the descending thoracic aortic contour, without definite evidence of a hiatal hernia on the lateral view. The thoracic aorta appears mildly more prominent on the frontal view in comparison to 2012. No acute osseous abnormalities are seen.  IMPRESSION: 1. Mild left basilar opacity may reflect atelectasis or mild pneumonia. 2. Unusual contour to the right paraspinal region at the lower thoracic spine, and to the descending thoracic aortic contour, without definite evidence of a hiatal hernia on the lateral view. The thoracic aorta appears mildly more prominent on the current study. CT of the chest would be helpful for further evaluation, to exclude underlying mass or thoracic aortic aneurysm, as deemed clinically appropriate.   Electronically Signed   By: Garald Balding M.D.   On: 04/03/2014 21:40   Ct Angio Chest Aorta W/cm &/or Wo/cm  04/03/2014    CLINICAL DATA:  Of patient complains of central non radiating sharp and stabbing chest pain with shortness of breath. Chest pain began this morning. Patient went in dialysis and chest pain subsided. After dialysis, chest pain began again. History of stroke.  EXAM: CT ANGIOGRAPHY CHEST, ABDOMEN AND PELVIS  TECHNIQUE: Multidetector CT imaging through the chest, abdomen and pelvis was performed using the standard protocol during bolus administration of intravenous contrast. Multiplanar reconstructed images and MIPs were obtained and reviewed to evaluate the vascular anatomy.  CONTRAST:  169m OMNIPAQUE IOHEXOL 350 MG/ML SOLN  COMPARISON:  CT abdomen and pelvis 01/28/2011  FINDINGS: CTA CHEST FINDINGS  Noncontrast images of the chest demonstrate calcification of the thoracic aorta and Coronary arteries as well as base of great vessels. Calcification in the visualized abdominal aorta and splenic artery. No evidence of intramural hematoma.  On views obtained during arterial phase after intravenous contrast injection are somewhat limited due to motion artifact. However, the aorta is normal in caliber without evidence of aneurysm. No evidence of aortic dissection, allowing for motion artifact. Central pulmonary arteries are opacified without evidence of significant pulmonary embolus.  Normal heart size. Great vessel origins are patent. Scattered mediastinal lymph nodes are not pathologically enlarged. Esophagus is decompressed. Sub cm hypo enhancing lesion in the left lobe of the thyroid. Thyroid gland is otherwise normal in size.  Evaluation of lungs is limited due to motion artifact. Probable dependent atelectasis in the lung bases. No focal consolidation is suggested. No pleural effusions. No pneumothorax. Airways appear patent.  Review of the MIP images confirms the above findings.  CTA ABDOMEN AND PELVIS FINDINGS  Abdominal aorta demonstrates diffuse calcification. No aortic aneurysm or dissection. The abdominal aorta,  celiac axis, superior mesenteric artery, single bilateral renal arteries, inferior mesenteric artery, and bilateral iliac, external iliac, internal iliac, and common femoral arteries are patent although there is diffuse atherosclerotic change throughout. Renal nephrograms are symmetrical.  The liver, spleen, gallbladder, pancreas, adrenal glands, inferior vena cava, and retroperitoneal lymph nodes are unremarkable. Both kidneys demonstrate diffuse parenchymal atrophy with subcentimeter cysts. No hydronephrosis or hydroureter. Stomach and small bowel are decompressed. Stool-filled colon without abnormal distention. No free air or free fluid in the abdomen.  Pelvis: The appendix is normal. Prostate gland is mildly enlarged, measuring 4.5 x 3.5 cm. Bladder wall is mildly thickened, possibly due to under distention. Bladder infection or bowel obstruction not excluded. Diverticulosis of the sigmoid colon without evidence of diverticulitis. No free or loculated pelvic fluid collections. No pelvic mass or lymphadenopathy. Small right inguinal hernia containing fat.  Bones: Degenerative changes throughout the thoracic and lumbar spine. Normal alignment. Degenerative changes in the shoulders. No destructive bone lesions are identified. No acute displaced fractures are suggested.  Review of the MIP images confirms the above findings.  IMPRESSION: No evidence of aneurysm or dissection in the thoracic or abdominal aorta. Diffuse atherosclerotic changes throughout. No definite evidence of any active pulmonary disease. No focal acute process demonstrated in the abdomen or pelvis. Prostate gland is enlarged. Small right inguinal hernia containing fat. Diffuse parenchymal atrophy in both kidneys.   Electronically Signed   By: WLucienne CapersM.D.   On: 04/03/2014 23:30   Ct Angio Abd/pel W/ And/or W/o  04/03/2014   CLINICAL DATA:  Of patient complains of central non radiating sharp and stabbing chest pain with shortness of  breath. Chest pain began this morning. Patient went in dialysis and chest pain subsided. After dialysis, chest pain began again. History of stroke.  EXAM: CT ANGIOGRAPHY CHEST, ABDOMEN AND PELVIS  TECHNIQUE: Multidetector CT imaging through the chest, abdomen and pelvis was performed using the standard protocol during bolus administration of intravenous contrast. Multiplanar reconstructed images and MIPs were obtained and reviewed to evaluate the vascular anatomy.  CONTRAST:  1064mOMNIPAQUE  IOHEXOL 350 MG/ML SOLN  COMPARISON:  CT abdomen and pelvis 01/28/2011  FINDINGS: CTA CHEST FINDINGS  Noncontrast images of the chest demonstrate calcification of the thoracic aorta and Coronary arteries as well as base of great vessels. Calcification in the visualized abdominal aorta and splenic artery. No evidence of intramural hematoma.  On views obtained during arterial phase after intravenous contrast injection are somewhat limited due to motion artifact. However, the aorta is normal in caliber without evidence of aneurysm. No evidence of aortic dissection, allowing for motion artifact. Central pulmonary arteries are opacified without evidence of significant pulmonary embolus.  Normal heart size. Great vessel origins are patent. Scattered mediastinal lymph nodes are not pathologically enlarged. Esophagus is decompressed. Sub cm hypo enhancing lesion in the left lobe of the thyroid. Thyroid gland is otherwise normal in size.  Evaluation of lungs is limited due to motion artifact. Probable dependent atelectasis in the lung bases. No focal consolidation is suggested. No pleural effusions. No pneumothorax. Airways appear patent.  Review of the MIP images confirms the above findings.  CTA ABDOMEN AND PELVIS FINDINGS  Abdominal aorta demonstrates diffuse calcification. No aortic aneurysm or dissection. The abdominal aorta, celiac axis, superior mesenteric artery, single bilateral renal arteries, inferior mesenteric artery, and  bilateral iliac, external iliac, internal iliac, and common femoral arteries are patent although there is diffuse atherosclerotic change throughout. Renal nephrograms are symmetrical.  The liver, spleen, gallbladder, pancreas, adrenal glands, inferior vena cava, and retroperitoneal lymph nodes are unremarkable. Both kidneys demonstrate diffuse parenchymal atrophy with subcentimeter cysts. No hydronephrosis or hydroureter. Stomach and small bowel are decompressed. Stool-filled colon without abnormal distention. No free air or free fluid in the abdomen.  Pelvis: The appendix is normal. Prostate gland is mildly enlarged, measuring 4.5 x 3.5 cm. Bladder wall is mildly thickened, possibly due to under distention. Bladder infection or bowel obstruction not excluded. Diverticulosis of the sigmoid colon without evidence of diverticulitis. No free or loculated pelvic fluid collections. No pelvic mass or lymphadenopathy. Small right inguinal hernia containing fat.  Bones: Degenerative changes throughout the thoracic and lumbar spine. Normal alignment. Degenerative changes in the shoulders. No destructive bone lesions are identified. No acute displaced fractures are suggested.  Review of the MIP images confirms the above findings.  IMPRESSION: No evidence of aneurysm or dissection in the thoracic or abdominal aorta. Diffuse atherosclerotic changes throughout. No definite evidence of any active pulmonary disease. No focal acute process demonstrated in the abdomen or pelvis. Prostate gland is enlarged. Small right inguinal hernia containing fat. Diffuse parenchymal atrophy in both kidneys.   Electronically Signed   By: Lucienne Capers M.D.   On: 04/03/2014 23:30    Review of Systems  Constitutional: Positive for malaise/fatigue. Negative for fever and chills.  HENT: Negative for ear pain.   Eyes: Negative for double vision and photophobia.  Respiratory: Positive for cough. Negative for shortness of breath.     Cardiovascular: Negative for palpitations.       Substernal/Upper abdominal discomfort  Gastrointestinal: Positive for heartburn and abdominal pain. Negative for nausea, vomiting and blood in stool.  Genitourinary: Negative for dysuria and hematuria.  Musculoskeletal: Negative for myalgias and neck pain.  Skin: Positive for itching.  Neurological: Negative for tingling, tremors, sensory change and headaches.  Endo/Heme/Allergies: Negative for polydipsia. Bruises/bleeds easily.  Psychiatric/Behavioral: Negative for suicidal ideas, hallucinations and substance abuse.   Blood pressure 130/65, pulse 66, temperature 98.3 F (36.8 C), temperature source Oral, resp. rate 18, SpO2 95 %. Physical Exam  Nursing note and vitals reviewed. Constitutional: He is oriented to person, place, and time. He appears well-developed and well-nourished. He appears distressed.  Cardiovascular: Normal rate, regular rhythm and intact distal pulses.   Murmur heard. Systolic murmur at RUSB/LUSB and LLSB  Respiratory: Effort normal and breath sounds normal. No respiratory distress. He has no wheezes. He has no rales. He exhibits tenderness.  Slight reproduction of pain; Coughs frequently one exam  GI: Soft. Bowel sounds are normal. He exhibits no distension. There is no tenderness.  Musculoskeletal: Normal range of motion. He exhibits no edema or tenderness.  Neurological: He is oriented to person, place, and time. No cranial nerve deficit. Coordination normal.  Skin: Skin is warm and dry. No rash noted. He is not diaphoretic. No erythema.  Psychiatric: His behavior is normal. Thought content normal.   Labs reviewed; wbc 15.5, h/h 12.6/37.9, plt 178, na 137, K 3.4, bun/cr 20/4.5, ast/alt 23/19, Troponin negative CTA negative for dissection ECG with ? Inferior Q, sinus rhythm 6/15 Echo with EF 50-55%, moderate LVH, mild AS with mean gradient 17 mmHg  Assessment/Plan: Problem List Chest Discomfort Abdominal  Discomfort Coughing ESRD on HD Mild Aortic Stenosis  GERD Dyslipidemia  Mr. Jasten Guyette is a 74 yo man with PMH of ESRD on hemodialysis, dyslipidemia, GERD, prior CVA, hypertension who presents with substernal chest discomfort/upper abdominal pain. Differential diagnosis is musculoskeletal pain, esophageal spasm, GERD, pericarditis, ACS/NSTEMI among other etiologies. I favor a diagnosis of underlying esophageal spasm, musculoskeletal pain related to incessant coughing or some sort of bronchospasm. I cannot rule out pericarditis but his ECG is not entirely consistent with this and his discomfort is not that positional. Although there is a high prevalence of CAD in patients with significant renal disease on dialysis he had a negative 2014 stress nuclear study. Trial of GI cocktail also done in the ER to assess response.  - trend cardiac markers - observation on telemetry - continue asa 81 mg, atorvastatin 40 mg qHS - hba1c, tsh, lipid panel, BNP - would suppress cough as able - tylenol with codeine  Jamielyn Petrucci 04/04/2014, 1:23 AM

## 2014-04-04 NOTE — H&P (Signed)
[facility] History and Physical  Alexander [name] VOJ:[id number] DOB: June 02, 1940 DOA: 04/03/2014  Referring physician: Debby Freiberg, MD PCP: Horatio Pel, MD   Chief Complaint: Chest Pain  HPI: Alexander [name] is a 74 y.o. male presents with chest pain. Patient states the pain started this morning. Patient states that pain is located in the middle chest. He indicates that it does not radiate. Patient states the pain is more of a heaviness. He states nothing has relieved the pain he has only had aspirin. Patient states the pain does not go down his arms. He states he does have a history of reflux. He did take prilosec this morning. In addition he does have a cold over the last two weeks. He has not been on any antibiotics. He states pain started on an empty stomach. Patient states he has not had relief with food. This pain is not similar to any chest pains he has had in the past.   Review of Systems:  Constitutional:  No weight loss, night sweats, Fevers, chills, fatigue.  HEENT:  No headaches, ++ear ache, ++nasal congestion, no post nasal drip,  Cardio-vascular:  ++chest pain, no Orthopnea, PND, ++dizziness, no palpitations GI:  No heartburn, indigestion, abdominal pain, nausea, vomiting, diarrhea, change in bowel habits Resp:  ++shortness of breath. No coughing up of blood.No change in color of mucus.No wheezing  Skin:  no rash or lesions.  GU:  no dysuria, change in color of urine, no urgency or frequency Musculoskeletal:  No joint pain or swelling. No decreased range of motion Psych:  No change in mood or affect. No depression or anxiety  Past Medical History  Diagnosis Date  . End stage renal disease     hemodialysis 3 times a week  . Seasonal allergies   . Hyperlipidemia   . Anemia   . Depression   . Macular degeneration     both eyes  . GERD (gastroesophageal reflux disease)   . Hypertension   . CVA (cerebral infarction)     2004/affected left side    . Peptic ulcer     bleeding, 1969  . Kidney stones   . Renal insufficiency   . Diverticulosis   . Tubular adenoma of colon 07/2001  . Barrett's esophagus 05/2003  . Stroke 2004  . Colon polyps   . Renal failure   . Hemodialysis patient   . Aortic stenosis 06/15/12    TEE - EF 99-37%; grade 1 diastolic dysfunction; mild/mod aortic valve stenosis; Mitral valve had calcified annulus, mild pulm htn PA peak pressure 2mmHg  . Atrial fibrillation 04/01/10    14 day event monitor - some sinus bradycardia, PACs and sinus tachycardia  . Atrial fibrillation 11/03/09    R/P MV - EF 66%; normal myocardial perfusion study, w/o chest pain or EKG changes for ischemia  . S/P epidural steroid injection     last  injection over 10 years ago   Past Surgical History  Procedure Laterality Date  . Av fistula placement  2009  . Laminectomy  1969  . Tonsillectomy  1964  . 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 Cystoscopy  several times    kidney stones  . Back surgery     Social History:  reports that he quit smoking about 16 years ago. He has never used smokeless tobacco. He reports that he does not drink alcohol or use illicit drugs.  Allergies  Allergen Reactions  . Penicillins Swelling  Family History  Problem Relation Age of Onset  . Colon cancer Neg Hx   . Stomach cancer Mother   . Hypertension Father     Died of heart attack     Prior to Admission medications   Medication Sig Start Date End Date Taking? Authorizing Provider  acetaminophen (TYLENOL) 500 MG tablet Take 1,000 mg by mouth every 6 (six) hours as needed for pain.   Yes Historical Provider, MD  amiodarone (PACERONE) 200 MG tablet Take 1 tablet (200 mg total) by mouth daily. 02/05/14  Yes Pixie Casino, MD  aspirin 81 MG tablet Take 81 mg by mouth daily.    Yes Historical Provider, MD  atorvastatin (LIPITOR) 40 MG tablet Take 20 mg by mouth daily.   Yes Historical Provider, MD  b complex-vitamin c-folic acid  (NEPHRO-VITE) 0.8 MG TABS Take 0.8 mg by mouth daily.    Yes Historical Provider, MD  bromocriptine (PARLODEL) 5 MG capsule Take 5 mg by mouth daily.  11/30/10  Yes Historical Provider, MD  calcium acetate (PHOSLO) 667 MG capsule Take 1,334 mg by mouth 3 (three) times daily with meals.   Yes Historical Provider, MD  cinacalcet (SENSIPAR) 30 MG tablet Take 30 mg by mouth daily.   Yes Historical Provider, MD  dextromethorphan 15 MG/5ML syrup Take 10 mLs by mouth 4 (four) times daily as needed for cough.   Yes Historical Provider, MD  dorzolamide-timolol (COSOPT) 22.3-6.8 MG/ML ophthalmic solution Place 1 drop into the right eye 2 (two) times daily. 08/13/13  Yes Historical Provider, MD  FLUoxetine (PROZAC) 20 MG capsule Take 40 mg by mouth daily.  11/30/10  Yes Historical Provider, MD  latanoprost (XALATAN) 0.005 % ophthalmic solution Place 1 drop into both eyes at bedtime. 02/13/13  Yes Historical Provider, MD  omeprazole (PRILOSEC) 20 MG capsule Take 20 mg by mouth daily.     Yes Historical Provider, MD  Cetirizine HCl 10 MG CAPS Take by mouth daily.    Historical Provider, MD   Physical Exam: Filed Vitals:   04/03/14 2200 04/03/14 2230 04/03/14 2324 04/03/14 2325  BP: 146/70 145/66 142/60   Pulse: 71 72  73  Temp:      TempSrc:      Resp: 25 23 17 19   SpO2: 91% 94%  96%    Wt Readings from Last 3 Encounters:  02/26/14 94.167 kg (207 lb 9.6 oz)  10/11/13 92.534 kg (204 lb)  09/27/13 92.534 kg (204 lb)    General:  Appears calm and comfortable Eyes: PERRL, normal lids, irises & conjunctiva ENT: grossly normal hearing, lips & tongue Neck: no LAD, masses or thyromegaly Cardiovascular: RRR, no m/r/g. No LE edema. Respiratory: CTA bilaterally, no w/r/r. Normal respiratory effort. Abdomen: soft, ntnd Skin: no rash or induration seen on limited exam Musculoskeletal: grossly normal tone BUE/BLE Psychiatric: grossly normal mood and affect, speech fluent and appropriate Neurologic: grossly  non-focal.          Labs on Admission:  Basic Metabolic Panel:  Recent Labs Lab 04/03/14 2047  NA 137  K 3.4*  CL 100  CO2 23  GLUCOSE 133*  BUN 20  CREATININE 4.47*  CALCIUM 8.7   Liver Function Tests:  Recent Labs Lab 04/03/14 2047  AST 23  ALT 19  ALKPHOS 79  BILITOT 0.7  PROT 7.2  ALBUMIN 3.6   No results for input(s): LIPASE, AMYLASE in the last 168 hours. No results for input(s): AMMONIA in the last 168 hours. CBC:  Recent  Labs Lab 04/03/14 2047  WBC 15.5*  HGB 12.6*  HCT 37.9*  MCV 103.6*  PLT 178   Cardiac Enzymes:  Recent Labs Lab 04/03/14 2047  TROPONINI <0.03    BNP (last 3 results) No results for input(s): PROBNP in the last 8760 hours. CBG: No results for input(s): GLUCAP in the last 168 hours.  Radiological Exams on Admission: Dg Chest 2 View  04/03/2014   CLINICAL DATA:  Acute onset of mid sternal chest pain and epigastric pain. Initial encounter.  EXAM: CHEST  2 VIEW  COMPARISON:  Chest radiograph performed 03/09/2011  FINDINGS: The lungs are well-aerated. Mild left basilar opacity may reflect atelectasis or mild pneumonia. There is no evidence of pleural effusion or pneumothorax.  The heart is mildly enlarged. There is a unusual contour to the right paraspinal region at the lower thoracic spine and to the descending thoracic aortic contour, without definite evidence of a hiatal hernia on the lateral view. The thoracic aorta appears mildly more prominent on the frontal view in comparison to 2012. No acute osseous abnormalities are seen.  IMPRESSION: 1. Mild left basilar opacity may reflect atelectasis or mild pneumonia. 2. Unusual contour to the right paraspinal region at the lower thoracic spine, and to the descending thoracic aortic contour, without definite evidence of a hiatal hernia on the lateral view. The thoracic aorta appears mildly more prominent on the current study. CT of the chest would be helpful for further evaluation, to  exclude underlying mass or thoracic aortic aneurysm, as deemed clinically appropriate.   Electronically Signed   By: Garald Balding M.D.   On: 04/03/2014 21:40   Ct Angio Chest Aorta W/cm &/or Wo/cm  04/03/2014   CLINICAL DATA:  Of patient complains of central non radiating sharp and stabbing chest pain with shortness of breath. Chest pain began this morning. Patient went in dialysis and chest pain subsided. After dialysis, chest pain began again. History of stroke.  EXAM: CT ANGIOGRAPHY CHEST, ABDOMEN AND PELVIS  TECHNIQUE: Multidetector CT imaging through the chest, abdomen and pelvis was performed using the standard protocol during bolus administration of intravenous contrast. Multiplanar reconstructed images and MIPs were obtained and reviewed to evaluate the vascular anatomy.  CONTRAST:  153mL OMNIPAQUE IOHEXOL 350 MG/ML SOLN  COMPARISON:  CT abdomen and pelvis 01/28/2011  FINDINGS: CTA CHEST FINDINGS  Noncontrast images of the chest demonstrate calcification of the thoracic aorta and Coronary arteries as well as base of great vessels. Calcification in the visualized abdominal aorta and splenic artery. No evidence of intramural hematoma.  On views obtained during arterial phase after intravenous contrast injection are somewhat limited due to motion artifact. However, the aorta is normal in caliber without evidence of aneurysm. No evidence of aortic dissection, allowing for motion artifact. Central pulmonary arteries are opacified without evidence of significant pulmonary embolus.  Normal heart size. Great vessel origins are patent. Scattered mediastinal lymph nodes are not pathologically enlarged. Esophagus is decompressed. Sub cm hypo enhancing lesion in the left lobe of the thyroid. Thyroid gland is otherwise normal in size.  Evaluation of lungs is limited due to motion artifact. Probable dependent atelectasis in the lung bases. No focal consolidation is suggested. No pleural effusions. No pneumothorax.  Airways appear patent.  Review of the MIP images confirms the above findings.  CTA ABDOMEN AND PELVIS FINDINGS  Abdominal aorta demonstrates diffuse calcification. No aortic aneurysm or dissection. The abdominal aorta, celiac axis, superior mesenteric artery, single bilateral renal arteries, inferior mesenteric artery, and bilateral  iliac, external iliac, internal iliac, and common femoral arteries are patent although there is diffuse atherosclerotic change throughout. Renal nephrograms are symmetrical.  The liver, spleen, gallbladder, pancreas, adrenal glands, inferior vena cava, and retroperitoneal lymph nodes are unremarkable. Both kidneys demonstrate diffuse parenchymal atrophy with subcentimeter cysts. No hydronephrosis or hydroureter. Stomach and small bowel are decompressed. Stool-filled colon without abnormal distention. No free air or free fluid in the abdomen.  Pelvis: The appendix is normal. Prostate gland is mildly enlarged, measuring 4.5 x 3.5 cm. Bladder wall is mildly thickened, possibly due to under distention. Bladder infection or bowel obstruction not excluded. Diverticulosis of the sigmoid colon without evidence of diverticulitis. No free or loculated pelvic fluid collections. No pelvic mass or lymphadenopathy. Small right inguinal hernia containing fat.  Bones: Degenerative changes throughout the thoracic and lumbar spine. Normal alignment. Degenerative changes in the shoulders. No destructive bone lesions are identified. No acute displaced fractures are suggested.  Review of the MIP images confirms the above findings.  IMPRESSION: No evidence of aneurysm or dissection in the thoracic or abdominal aorta. Diffuse atherosclerotic changes throughout. No definite evidence of any active pulmonary disease. No focal acute process demonstrated in the abdomen or pelvis. Prostate gland is enlarged. Small right inguinal hernia containing fat. Diffuse parenchymal atrophy in both kidneys.   Electronically  Signed   By: Lucienne Capers M.D.   On: 04/03/2014 23:30   Ct Angio Abd/pel W/ And/or W/o  04/03/2014   CLINICAL DATA:  Of patient complains of central non radiating sharp and stabbing chest pain with shortness of breath. Chest pain began this morning. Patient went in dialysis and chest pain subsided. After dialysis, chest pain began again. History of stroke.  EXAM: CT ANGIOGRAPHY CHEST, ABDOMEN AND PELVIS  TECHNIQUE: Multidetector CT imaging through the chest, abdomen and pelvis was performed using the standard protocol during bolus administration of intravenous contrast. Multiplanar reconstructed images and MIPs were obtained and reviewed to evaluate the vascular anatomy.  CONTRAST:  120mL OMNIPAQUE IOHEXOL 350 MG/ML SOLN  COMPARISON:  CT abdomen and pelvis 01/28/2011  FINDINGS: CTA CHEST FINDINGS  Noncontrast images of the chest demonstrate calcification of the thoracic aorta and Coronary arteries as well as base of great vessels. Calcification in the visualized abdominal aorta and splenic artery. No evidence of intramural hematoma.  On views obtained during arterial phase after intravenous contrast injection are somewhat limited due to motion artifact. However, the aorta is normal in caliber without evidence of aneurysm. No evidence of aortic dissection, allowing for motion artifact. Central pulmonary arteries are opacified without evidence of significant pulmonary embolus.  Normal heart size. Great vessel origins are patent. Scattered mediastinal lymph nodes are not pathologically enlarged. Esophagus is decompressed. Sub cm hypo enhancing lesion in the left lobe of the thyroid. Thyroid gland is otherwise normal in size.  Evaluation of lungs is limited due to motion artifact. Probable dependent atelectasis in the lung bases. No focal consolidation is suggested. No pleural effusions. No pneumothorax. Airways appear patent.  Review of the MIP images confirms the above findings.  CTA ABDOMEN AND PELVIS FINDINGS   Abdominal aorta demonstrates diffuse calcification. No aortic aneurysm or dissection. The abdominal aorta, celiac axis, superior mesenteric artery, single bilateral renal arteries, inferior mesenteric artery, and bilateral iliac, external iliac, internal iliac, and common femoral arteries are patent although there is diffuse atherosclerotic change throughout. Renal nephrograms are symmetrical.  The liver, spleen, gallbladder, pancreas, adrenal glands, inferior vena cava, and retroperitoneal lymph nodes are unremarkable. Both kidneys  demonstrate diffuse parenchymal atrophy with subcentimeter cysts. No hydronephrosis or hydroureter. Stomach and small bowel are decompressed. Stool-filled colon without abnormal distention. No free air or free fluid in the abdomen.  Pelvis: The appendix is normal. Prostate gland is mildly enlarged, measuring 4.5 x 3.5 cm. Bladder wall is mildly thickened, possibly due to under distention. Bladder infection or bowel obstruction not excluded. Diverticulosis of the sigmoid colon without evidence of diverticulitis. No free or loculated pelvic fluid collections. No pelvic mass or lymphadenopathy. Small right inguinal hernia containing fat.  Bones: Degenerative changes throughout the thoracic and lumbar spine. Normal alignment. Degenerative changes in the shoulders. No destructive bone lesions are identified. No acute displaced fractures are suggested.  Review of the MIP images confirms the above findings.  IMPRESSION: No evidence of aneurysm or dissection in the thoracic or abdominal aorta. Diffuse atherosclerotic changes throughout. No definite evidence of any active pulmonary disease. No focal acute process demonstrated in the abdomen or pelvis. Prostate gland is enlarged. Small right inguinal hernia containing fat. Diffuse parenchymal atrophy in both kidneys.   Electronically Signed   By: Lucienne Capers M.D.   On: 04/03/2014 23:30      Assessment/Plan Principal Problem:   Chest  pain Active Problems:   PAF (paroxysmal atrial fibrillation)   ESRD (end stage renal disease) on dialysis   Hyperlipidemia   1. Chest pain -appears to sound atypical but he does have aortic stenosis in the past -will admit to telemetry -cardiology to see the patient  2. ESRD on Dialysis -received dialysis today -will monitor labs  3. Hyperlipidemia -continue with home medications -will monitor labs  4. Atrial fibrillation -currently rate controlled -will monitor  5. Aortic Stenosis -was mild by echo in June -cardiology to see -repeat echo   Code Status: Full Code (must indicate code status--if unknown or must be presumed, indicate so) DVT Prophylaxis:Heparin Family Communication: Wife (indicate person spoken with, if applicable, with phone number if by telephone) Disposition Plan: Home (indicate anticipated LOS)  Time spent: 76min  Jana Swartzlander A [facility] Pager [phone]

## 2014-04-04 NOTE — ED Notes (Signed)
Contacted pharmacy regarding pt [place], pharmacy will send to pod A

## 2014-04-04 NOTE — ED Notes (Signed)
Spoke with Dr. Hilbert Bible, will change pt's bed request. 2W informed. Flow called.

## 2014-04-04 NOTE — Consult Note (Signed)
Referring Provider: No ref. provider found Primary Care Physician:  Horatio Pel, MD Primary Nephrologist:  Dr. Lorrene Reid  Reason for Consultation:  Medical management of ESRD   Anemia  Secondary HPT and volume management  HPI: Alexander [name] is a 74 y.o. male presents with chest pain x 1 day. locatized in the middle chest. He indicates that it does not radiate. Patient states the pain is more of a heaviness.  Pain is persistent despite nitroglycerine . [name] He states he does have a history of  Dyspepsia and reflux.and takes prilosec. He has aortic stenosis and underwent 2 D echo 2013.   Past Medical History  Diagnosis Date  . End stage renal disease     hemodialysis 3 times a week  . Seasonal allergies   . Hyperlipidemia   . Anemia   . Depression   . Macular degeneration     both eyes  . GERD (gastroesophageal reflux disease)   . Hypertension   . CVA (cerebral infarction)     2004/affected left side  . Peptic ulcer     bleeding, 1969  . Kidney stones   . Renal insufficiency   . Diverticulosis   . Tubular adenoma of colon 07/2001  . Barrett's esophagus 05/2003  . Stroke 2004  . Colon polyps   . Renal failure   . Hemodialysis patient   . Aortic stenosis 06/15/12    TEE - EF 33-54%; grade 1 diastolic dysfunction; mild/mod aortic valve stenosis; Mitral valve had calcified annulus, mild pulm htn PA peak pressure 20mmHg  . Atrial fibrillation 04/01/10    14 day event monitor - some sinus bradycardia, PACs and sinus tachycardia  . Atrial fibrillation 11/03/09    R/P MV - EF 66%; normal myocardial perfusion study, w/o chest pain or EKG changes for ischemia  . S/P epidural steroid injection     last  injection over 10 years ago  . Diabetes mellitus without complication     Past Surgical History  Procedure Laterality Date  . Av fistula placement  2009  . Laminectomy  1969  . Tonsillectomy  1964  . 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 Cystoscopy  several times    kidney  stones  . Back surgery      Prior to Admission medications   Medication Sig Start Date End Date Taking? Authorizing Provider  acetaminophen (TYLENOL) 500 MG tablet Take 1,000 mg by mouth every 6 (six) hours as needed for pain.   Yes Historical Provider, MD  amiodarone (PACERONE) 200 MG tablet Take 1 tablet (200 mg total) by mouth daily. 02/05/14  Yes Pixie Casino, MD  aspirin 81 MG tablet Take 81 mg by mouth daily.    Yes Historical Provider, MD  atorvastatin (LIPITOR) 40 MG tablet Take 20 mg by mouth daily.   Yes Historical Provider, MD  b complex-vitamin c-folic acid (NEPHRO-VITE) 0.8 MG TABS Take 0.8 mg by mouth daily.    Yes Historical Provider, MD  bromocriptine (PARLODEL) 5 MG capsule Take 5 mg by mouth daily.  11/30/10  Yes Historical Provider, MD  calcium acetate (PHOSLO) 667 MG capsule Take 1,334 mg by mouth 3 (three) times daily with meals.   Yes Historical Provider, MD  cinacalcet (SENSIPAR) 30 MG tablet Take 30 mg by mouth daily.   Yes Historical Provider, MD  dextromethorphan 15 MG/5ML syrup Take 10 mLs by mouth 4 (four) times daily as needed for cough.   Yes Historical Provider, MD  dorzolamide-timolol (COSOPT) 22.3-6.8 MG/ML ophthalmic solution Place 1 drop into the right eye 2 (two) times daily. 08/13/13  Yes Historical Provider, MD  FLUoxetine (PROZAC) 20 MG capsule Take 40 mg by mouth daily.  11/30/10  Yes Historical Provider, MD  latanoprost (XALATAN) 0.005 % ophthalmic solution Place 1 drop into both eyes at bedtime. 02/13/13  Yes Historical Provider, MD  omeprazole (PRILOSEC) 20 MG capsule Take 20 mg by mouth daily.     Yes Historical Provider, MD  Cetirizine HCl 10 MG CAPS Take by mouth daily.    Historical Provider, MD    Current Facility-Administered Medications  Medication Dose Route Frequency Provider Last Rate Last Dose  . acetaminophen (TYLENOL) tablet 650 mg  650 mg Oral Q4H PRN Allyne Gee, MD      . amiodarone (PACERONE) tablet 200 mg  200 mg Oral Daily Allyne Gee, MD   200 mg at 04/04/14 1058  . [START ON 04/05/2014] aspirin EC tablet 81 mg  81 mg Oral Daily Sahar Osman, PA-C      . atorvastatin (LIPITOR) tablet 20 mg  20 mg Oral q1800 Allyne Gee, MD   20 mg at 04/04/14 1809  . bromocriptine (PARLODEL) tablet 5 mg  5 mg Oral Daily Allyne Gee, MD   5 mg at 04/04/14 1026  . calcium acetate (PHOSLO) capsule 1,334 mg  1,334 mg Oral TID WC Allyne Gee, MD   1,334 mg at 04/04/14 1800  . cinacalcet (SENSIPAR) tablet 30 mg  30 mg Oral Q breakfast Allyne Gee, MD   30 mg at 04/04/14 1027  . dextromethorphan (DELSYM) 30 MG/5ML liquid 30 mg  30 mg Oral BID PRN Arty Baumgartner, RPH      . dorzolamide-timolol (COSOPT) 22.3-6.8 MG/ML ophthalmic solution 1 drop  1 drop Right Eye BID Allyne Gee, MD   1 drop at 04/04/14 0550  . fentaNYL (SUBLIMAZE) injection 25 mcg  25 mcg Intravenous Q4H PRN Ripudeep K Rai, MD      . FLUoxetine (PROZAC) capsule 40 mg  40 mg Oral Daily Allyne Gee, MD   40 mg at 04/04/14 1032  . heparin injection 5,000 Units  5,000 Units Subcutaneous 3 times per day Allyne Gee, MD   5,000 Units at 04/04/14 1330  . latanoprost (XALATAN) 0.005 % ophthalmic solution 1 drop  1 drop Both Eyes QHS Allyne Gee, MD      . midodrine (PROAMATINE) tablet 10 mg  10 mg Oral Daily Ripudeep Krystal Eaton, MD   10 mg at 04/04/14 1809  . ondansetron (ZOFRAN) injection 4 mg  4 mg Intravenous Q6H PRN Allyne Gee, MD      . Derrill Memo ON 04/05/2014] pantoprazole (PROTONIX) EC tablet 40 mg  40 mg Oral Q0600 Vena Rua, PA-C      . zolpidem (AMBIEN) tablet 5 mg  5 mg Oral QHS PRN Allyne Gee, MD        Allergies as of 04/03/2014 - Review Complete 04/03/2014  Allergen Reaction Noted  . Penicillins Swelling 01/21/2011    Family History  Problem Relation Age of Onset  . Colon cancer Neg Hx   . Stomach cancer Mother   . Hypertension Father     Died of heart attack    History   Social History  . Marital Status: Married    Spouse Name: N/A     Number of Children: 3  . Years of Education: N/A  Occupational History  . retired    Social History Main Topics  . Smoking status: Former Smoker    Quit date: 01/20/1998  . Smokeless tobacco: Never Used  . Alcohol Use: No  . Drug Use: No  . Sexual Activity: Not on file   Other Topics Concern  . Not on file   Social History Narrative    Review of Systems: Gen: Denies any fever, chills, sweats, anorexia, fatigue, weakness, malaise, weight loss, and sleep disorder HEENT: some congestion  CV: + chest pain, angina, palpitations, syncope,+ orthopnea, PND, peripheral edema, and claudication. Resp: Denies dyspnea at rest, dyspnea with exercise, cough, sputum, wheezing, coughing up blood, and pleurisy. GI: Denies vomiting blood, jaundice, and fecal incontinence.   Denies dysphagia or odynophagia. GU : Denies urinary burning, blood in urine, urinary frequency, urinary hesitancy, nocturnal urination, and urinary incontinence.  No renal calculi. MS: Denies joint pain, limitation of movement, and swelling, stiffness, low back pain, extremity pain. Denies muscle weakness, cramps, atrophy.  No use of non steroidal antiinflammatory drugs. Derm: Denies rash, itching, dry skin, hives, moles, warts, or unhealing ulcers.  Psych: Denies depression, anxiety, memory loss, suicidal ideation, hallucinations, paranoia, and confusion. Heme: Denies bruising, bleeding, and enlarged lymph nodes. Neuro: No headache.  No diplopia. No dysarthria.  No dysphasia.  No history of CVA.  No Seizures. No paresthesias.  No weakness. Endocrine No DM.  No Thyroid disease.  No Adrenal disease.  Physical Exam: Vital signs in last 24 hours: Temp:  [98.3 F (36.8 C)] 98.3 F (36.8 C) (01/07 1205) Pulse Rate:  [56-93] 88 (01/07 1205) Resp:  [16-29] 18 (01/07 1205) BP: (80-151)/(47-89) 80/61 mmHg (01/07 1205) SpO2:  [90 %-100 %] 94 % (01/07 1205) Weight:  [95.482 kg (210 lb 8 oz)] 95.482 kg (210 lb 8 oz) (01/07 1205)    General:   Alert,  Well-developed, well-nourished, pleasant and cooperative in NAD Head:  Normocephalic and atraumatic. Eyes:  Sclera clear, no icterus.   Conjunctiva pink. Ears:  Normal auditory acuity. Nose:  No deformity, discharge,  or lesions. Mouth:  No deformity or lesions, dentition normal. Neck:  Supple; no masses or thyromegaly. JVP not elevated Lungs:  Clear throughout to auscultation.   No wheezes, crackles, or rhonchi. No acute distress. Heart:  Regular rate and rhythm; no murmurs, clicks, rubs,  or gallops. Abdomen:  Soft, nontender and nondistended. No masses, hepatosplenomegaly or hernias noted. Normal bowel sounds, without guarding, and without rebound.   Msk:  Symmetrical without gross deformities. Normal posture. Pulses:  No carotid, renal, femoral bruits. DP and PT symmetrical and equal Extremities:  Without clubbing or edema. Neurologic:  Alert and  oriented x4;  grossly normal neurologically. Skin:  Intact without significant lesions or rashes. Cervical Nodes:  No significant cervical adenopathy. Psych:  Alert and cooperative. Normal mood and affect.  Intake/Output from previous day:   Intake/Output this shift:    Lab Results:  Recent Labs  04/03/14 2047 04/04/14 0357  WBC 15.5* 14.6*  HGB 12.6* 11.9*  HCT 37.9* 35.4*  PLT 178 163   BMET  Recent Labs  04/03/14 2047 04/04/14 0357  NA 137  --   K 3.4*  --   CL 100  --   CO2 23  --   GLUCOSE 133*  --   BUN 20  --   CREATININE 4.47* 5.22*  CALCIUM 8.7  --    LFT  Recent Labs  04/03/14 2047  PROT 7.2  ALBUMIN 3.6  AST 23  ALT 19  ALKPHOS 79  BILITOT 0.7   PT/INR No results for input(s): LABPROT, INR in the last 72 hours. Hepatitis Panel No results for input(s): HEPBSAG, HCVAB, HEPAIGM, HEPBIGM in the last 72 hours.  Studies/Results: Dg Chest 2 View  04/03/2014   CLINICAL DATA:  Acute onset of mid sternal chest pain and epigastric pain. Initial encounter.  EXAM: CHEST  2 VIEW   COMPARISON:  Chest radiograph performed 03/09/2011  FINDINGS: The lungs are well-aerated. Mild left basilar opacity may reflect atelectasis or mild pneumonia. There is no evidence of pleural effusion or pneumothorax.  The heart is mildly enlarged. There is a unusual contour to the right paraspinal region at the lower thoracic spine and to the descending thoracic aortic contour, without definite evidence of a hiatal hernia on the lateral view. The thoracic aorta appears mildly more prominent on the frontal view in comparison to 2012. No acute osseous abnormalities are seen.  IMPRESSION: 1. Mild left basilar opacity may reflect atelectasis or mild pneumonia. 2. Unusual contour to the right paraspinal region at the lower thoracic spine, and to the descending thoracic aortic contour, without definite evidence of a hiatal hernia on the lateral view. The thoracic aorta appears mildly more prominent on the current study. CT of the chest would be helpful for further evaluation, to exclude underlying mass or thoracic aortic aneurysm, as deemed clinically appropriate.   Electronically Signed   By: Garald Balding M.D.   On: 04/03/2014 21:40   Ct Angio Chest Aorta W/cm &/or Wo/cm  04/03/2014   CLINICAL DATA:  Of patient complains of central non radiating sharp and stabbing chest pain with shortness of breath. Chest pain began this morning. Patient went in dialysis and chest pain subsided. After dialysis, chest pain began again. History of stroke.  EXAM: CT ANGIOGRAPHY CHEST, ABDOMEN AND PELVIS  TECHNIQUE: Multidetector CT imaging through the chest, abdomen and pelvis was performed using the standard protocol during bolus administration of intravenous contrast. Multiplanar reconstructed images and MIPs were obtained and reviewed to evaluate the vascular anatomy.  CONTRAST:  145mL OMNIPAQUE IOHEXOL 350 MG/ML SOLN  COMPARISON:  CT abdomen and pelvis 01/28/2011  FINDINGS: CTA CHEST FINDINGS  Noncontrast images of the chest  demonstrate calcification of the thoracic aorta and Coronary arteries as well as base of great vessels. Calcification in the visualized abdominal aorta and splenic artery. No evidence of intramural hematoma.  On views obtained during arterial phase after intravenous contrast injection are somewhat limited due to motion artifact. However, the aorta is normal in caliber without evidence of aneurysm. No evidence of aortic dissection, allowing for motion artifact. Central pulmonary arteries are opacified without evidence of significant pulmonary embolus.  Normal heart size. Great vessel origins are patent. Scattered mediastinal lymph nodes are not pathologically enlarged. Esophagus is decompressed. Sub cm hypo enhancing lesion in the left lobe of the thyroid. Thyroid gland is otherwise normal in size.  Evaluation of lungs is limited due to motion artifact. Probable dependent atelectasis in the lung bases. No focal consolidation is suggested. No pleural effusions. No pneumothorax. Airways appear patent.  Review of the MIP images confirms the above findings.  CTA ABDOMEN AND PELVIS FINDINGS  Abdominal aorta demonstrates diffuse calcification. No aortic aneurysm or dissection. The abdominal aorta, celiac axis, superior mesenteric artery, single bilateral renal arteries, inferior mesenteric artery, and bilateral iliac, external iliac, internal iliac, and common femoral arteries are patent although there is diffuse atherosclerotic change throughout. Renal nephrograms are symmetrical.  The liver, spleen,  gallbladder, pancreas, adrenal glands, inferior vena cava, and retroperitoneal lymph nodes are unremarkable. Both kidneys demonstrate diffuse parenchymal atrophy with subcentimeter cysts. No hydronephrosis or hydroureter. Stomach and small bowel are decompressed. Stool-filled colon without abnormal distention. No free air or free fluid in the abdomen.  Pelvis: The appendix is normal. Prostate gland is mildly enlarged,  measuring 4.5 x 3.5 cm. Bladder wall is mildly thickened, possibly due to under distention. Bladder infection or bowel obstruction not excluded. Diverticulosis of the sigmoid colon without evidence of diverticulitis. No free or loculated pelvic fluid collections. No pelvic mass or lymphadenopathy. Small right inguinal hernia containing fat.  Bones: Degenerative changes throughout the thoracic and lumbar spine. Normal alignment. Degenerative changes in the shoulders. No destructive bone lesions are identified. No acute displaced fractures are suggested.  Review of the MIP images confirms the above findings.  IMPRESSION: No evidence of aneurysm or dissection in the thoracic or abdominal aorta. Diffuse atherosclerotic changes throughout. No definite evidence of any active pulmonary disease. No focal acute process demonstrated in the abdomen or pelvis. Prostate gland is enlarged. Small right inguinal hernia containing fat. Diffuse parenchymal atrophy in both kidneys.   Electronically Signed   By: Lucienne Capers M.D.   On: 04/03/2014 23:30   Ct Angio Abd/pel W/ And/or W/o  04/03/2014   CLINICAL DATA:  Of patient complains of central non radiating sharp and stabbing chest pain with shortness of breath. Chest pain began this morning. Patient went in dialysis and chest pain subsided. After dialysis, chest pain began again. History of stroke.  EXAM: CT ANGIOGRAPHY CHEST, ABDOMEN AND PELVIS  TECHNIQUE: Multidetector CT imaging through the chest, abdomen and pelvis was performed using the standard protocol during bolus administration of intravenous contrast. Multiplanar reconstructed images and MIPs were obtained and reviewed to evaluate the vascular anatomy.  CONTRAST:  149mL OMNIPAQUE IOHEXOL 350 MG/ML SOLN  COMPARISON:  CT abdomen and pelvis 01/28/2011  FINDINGS: CTA CHEST FINDINGS  Noncontrast images of the chest demonstrate calcification of the thoracic aorta and Coronary arteries as well as base of great vessels.  Calcification in the visualized abdominal aorta and splenic artery. No evidence of intramural hematoma.  On views obtained during arterial phase after intravenous contrast injection are somewhat limited due to motion artifact. However, the aorta is normal in caliber without evidence of aneurysm. No evidence of aortic dissection, allowing for motion artifact. Central pulmonary arteries are opacified without evidence of significant pulmonary embolus.  Normal heart size. Great vessel origins are patent. Scattered mediastinal lymph nodes are not pathologically enlarged. Esophagus is decompressed. Sub cm hypo enhancing lesion in the left lobe of the thyroid. Thyroid gland is otherwise normal in size.  Evaluation of lungs is limited due to motion artifact. Probable dependent atelectasis in the lung bases. No focal consolidation is suggested. No pleural effusions. No pneumothorax. Airways appear patent.  Review of the MIP images confirms the above findings.  CTA ABDOMEN AND PELVIS FINDINGS  Abdominal aorta demonstrates diffuse calcification. No aortic aneurysm or dissection. The abdominal aorta, celiac axis, superior mesenteric artery, single bilateral renal arteries, inferior mesenteric artery, and bilateral iliac, external iliac, internal iliac, and common femoral arteries are patent although there is diffuse atherosclerotic change throughout. Renal nephrograms are symmetrical.  The liver, spleen, gallbladder, pancreas, adrenal glands, inferior vena cava, and retroperitoneal lymph nodes are unremarkable. Both kidneys demonstrate diffuse parenchymal atrophy with subcentimeter cysts. No hydronephrosis or hydroureter. Stomach and small bowel are decompressed. Stool-filled colon without abnormal distention. No free air or  free fluid in the abdomen.  Pelvis: The appendix is normal. Prostate gland is mildly enlarged, measuring 4.5 x 3.5 cm. Bladder wall is mildly thickened, possibly due to under distention. Bladder infection  or bowel obstruction not excluded. Diverticulosis of the sigmoid colon without evidence of diverticulitis. No free or loculated pelvic fluid collections. No pelvic mass or lymphadenopathy. Small right inguinal hernia containing fat.  Bones: Degenerative changes throughout the thoracic and lumbar spine. Normal alignment. Degenerative changes in the shoulders. No destructive bone lesions are identified. No acute displaced fractures are suggested.  Review of the MIP images confirms the above findings.  IMPRESSION: No evidence of aneurysm or dissection in the thoracic or abdominal aorta. Diffuse atherosclerotic changes throughout. No definite evidence of any active pulmonary disease. No focal acute process demonstrated in the abdomen or pelvis. Prostate gland is enlarged. Small right inguinal hernia containing fat. Diffuse parenchymal atrophy in both kidneys.   Electronically Signed   By: Lucienne Capers M.D.   On: 04/03/2014 23:30    Assessment/Plan:  ESRD- MWF dialysis patient [place]   ANEMIA- no ESA  MBD- will check phosphorus with next set of labs  HTN/VOL- low BP concerning no evidence of sepsis  Most likely  Cardiac   ACCESS- AVF with [place] Left  LOS: 1 [place] W @TODAY @6 :10 PM

## 2014-04-04 NOTE — Progress Notes (Signed)
B/P 80/61 MD notified 1L bolus started lunch tray came to room and pt sat on edge of bed took 1 bite and began having dry heaves After settled down pt did tol 1 saltine cracker and few sips of sprite. Bolus completed at this time and pt is resting quietly in bed B/P 88/65

## 2014-04-04 NOTE — ED Notes (Signed)
Cardiology at [facility]

## 2014-04-04 NOTE — ED Notes (Signed)
Admitting MD paged 

## 2014-04-04 NOTE — ED Notes (Signed)
Dr. Claiborne Billings, cardio, gives verbal order for GI cocktail.

## 2014-04-04 NOTE — ED Notes (Signed)
Will adm morphine and will re-evaluate pain to see if pt is able to go to a telemetry unit.

## 2014-04-04 NOTE — ED Notes (Signed)
Contacted pharmacy again in regards to [place], pharmacy to send to [facility] right now

## 2014-04-04 NOTE — Consult Note (Signed)
[facility] Consult: 8:28 AM 04/04/2014  LOS: 1 day    Referring Provider: Dr Tana Coast  Primary Care Physician:  Alexander Pel, MD Primary Gastroenterologist:  Alexander. Fuller [name].  Previously Alexander [name].      Reason for Consultation:  Non-cardiac chest pain.    HPI: Alexander [name] is a 74 y.o. male.  Hx ESRD. CVA/TIA.  HTN   DM2. A fib on Amiodarone but not warfarin etc. . Non-cardiac chest/epigastric pain. Hx Barretts and gastritis.  No Barretts on EGD 09/2013.  Hx adenomatous polyps on several colonoscopies.   Pt has had vigorous, productive cough with nasal congestion for 2 weeks.  Alexander Shelia Media treated it with nasal spray. Awoke 6 AM yesterday with acute pain in epigastrum/lower sternum.  Resolved after one hour.  Spent the usual 4 hours at dialysis.  No problems at lunch.  At 3 PM pain recurred and has not resolved yet.  Some slight improvement with GI cocktail and better relief with Morphine.  However his chronically low BP (systolic 82N) is now in 56O so anaelgesic options limited. Swallowing liquids has not exacerbated the discomfort.     Labs thus far with negative cardiac enzymes and EKG.  Nuclear stress test 10/2012 was negative.  Cardiology has seen pt and favor non-cardiac cause of sxs.   LFTs normal.  CT angio chest/abd/pelvis/aorta with diffuse atherosclerosis of aorta/celiac/mesenteric/iliac/femoral vessels but all are patent.      Past Medical History  Diagnosis Date  . End stage renal disease     hemodialysis 3 times a week  . Seasonal allergies   . Hyperlipidemia   . Anemia   . Depression   . Macular degeneration     both eyes  . GERD (gastroesophageal reflux disease)   . Hypertension   . CVA (cerebral infarction)     2004/affected left side  . Peptic ulcer     bleeding, 1969  . Kidney stones   .  Renal insufficiency   . Diverticulosis   . Tubular adenoma of colon 07/2001  . Barrett's esophagus 05/2003  . Stroke 2004  . Colon polyps   . Renal failure   . Hemodialysis patient   . Aortic stenosis 06/15/12    TEE - EF 13-08%; grade 1 diastolic dysfunction; mild/mod aortic valve stenosis; Mitral valve had calcified annulus, mild pulm htn PA peak pressure 68mmHg  . Atrial fibrillation 04/01/10    14 day event monitor - some sinus bradycardia, PACs and sinus tachycardia  . Atrial fibrillation 11/03/09    R/P MV - EF 66%; normal myocardial perfusion study, w/o chest pain or EKG changes for ischemia  . S/P epidural steroid injection     last  injection over 10 years ago    Past Surgical History  Procedure Laterality Date  . Av fistula placement  2009  . Laminectomy  1969  . Tonsillectomy  1964  . 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 Cystoscopy  several times    kidney stones  . Back surgery      Prior  to Admission medications   Medication Sig Start Date End Date Taking? Authorizing Provider  acetaminophen (TYLENOL) 500 MG tablet Take 1,000 mg by mouth every 6 (six) hours as needed for pain.   Yes Historical Provider, MD  amiodarone (PACERONE) 200 MG tablet Take 1 tablet (200 mg total) by mouth daily. 02/05/14  Yes Pixie Casino, MD  aspirin 81 MG tablet Take 81 mg by mouth daily.    Yes Historical Provider, MD  atorvastatin (LIPITOR) 40 MG tablet Take 20 mg by mouth daily.   Yes Historical Provider, MD  b complex-vitamin c-folic acid (NEPHRO-VITE) 0.8 MG TABS Take 0.8 mg by mouth daily.    Yes Historical Provider, MD  bromocriptine (PARLODEL) 5 MG capsule Take 5 mg by mouth daily.  11/30/10  Yes Historical Provider, MD  calcium acetate (PHOSLO) 667 MG capsule Take 1,334 mg by mouth 3 (three) times daily with meals.   Yes Historical Provider, MD  cinacalcet (SENSIPAR) 30 MG tablet Take 30 mg by mouth daily.   Yes Historical Provider, MD  dextromethorphan 15 MG/5ML syrup Take 10  mLs by mouth 4 (four) times daily as needed for cough.   Yes Historical Provider, MD  dorzolamide-timolol (COSOPT) 22.3-6.8 MG/ML ophthalmic solution Place 1 drop into the right eye 2 (two) times daily. 08/13/13  Yes Historical Provider, MD  FLUoxetine (PROZAC) 20 MG capsule Take 40 mg by mouth daily.  11/30/10  Yes Historical Provider, MD  latanoprost (XALATAN) 0.005 % ophthalmic solution Place 1 drop into both eyes at bedtime. 02/13/13  Yes Historical Provider, MD  omeprazole (PRILOSEC) 20 MG capsule Take 20 mg by mouth daily.     Yes Historical Provider, MD  Cetirizine HCl 10 MG CAPS Take by mouth daily.    Historical Provider, MD    Scheduled Meds: . amiodarone  200 mg Oral Daily  . aspirin EC  325 mg Oral Daily  . atorvastatin  20 mg Oral Daily  . bromocriptine  5 mg Oral Daily  . calcium acetate  1,334 mg Oral TID WC  . cinacalcet  30 mg Oral Daily  . dorzolamide-timolol  1 drop Right Eye BID  . FLUoxetine  40 mg Oral Daily  . heparin  5,000 Units Subcutaneous 3 times per day  . latanoprost  1 drop Both Eyes QHS  . pantoprazole  80 mg Oral Daily   Infusions:   PRN Meds: acetaminophen, dextromethorphan, morphine injection, ondansetron (ZOFRAN) IV, zolpidem   Allergies as of 04/03/2014 - Review Complete 04/03/2014  Allergen Reaction Noted  . Penicillins Swelling 01/21/2011    Family History  Problem Relation Age of Onset  . Colon cancer Neg Hx   . Stomach cancer Mother   . Hypertension Father     Died of heart attack    History   Social History  . Marital Status: Married    Spouse Name: N/A    Number of Children: 3  . Years of Education: N/A   Occupational History  . retired    Social History Main Topics  . Smoking status: Former Smoker    Quit date: 01/20/1998  . Smokeless tobacco: Never Used  . Alcohol Use: No  . Drug Use: No  . Sexual Activity: Not on file   Other Topics Concern  . Not on file   Social History Narrative    REVIEW OF  SYSTEMS: Constitutional:  Stable weight. No weakness. ENT:  No nose bleeds Pulm:  Per HPI CV:  No palpitations, no LE edema.  GU:  No hematuria, no frequency.  Does still make urine. GI:  Per HPI Heme:  No unusual bleeding or excessive bruising.   Transfusions:   None Neuro:  No headaches, no peripheral tingling or numbness Derm:  No itching, no rash or sores.  Endocrine:  No sweats or chills.  No polyuria or dysuria Immunization:   Up-to-date on flu and pneumococcal vaccinations. Travel:  None beyond local counties in last few months.    PHYSICAL EXAM: Vital signs in last 24 hours: Filed Vitals:   04/04/14 0656  BP: 133/89  Pulse: 75  Temp:   Resp: 20   Wt Readings from Last 3 Encounters:  02/26/14 207 lb 9.6 oz (94.167 kg)  10/11/13 204 lb (92.534 kg)  09/27/13 204 lb (92.534 kg)   General:  patient looks well and is comfortable. Head:   No facial edema or asymmetry.  Eyes:   No scleral icterus, no conjunctival pallor. Ears:   Slightly diminished hearing.  Nose:   No rhinorrhea or sneezing. Mouth:   Clear, mucous membranes moist. Good dental repair but lots of plaque. Neck:   No JVD, masses, TMG or bruits. Lungs:   Clear to auscultation and percussion bilaterally.  No cough or dyspnea observed. No wet vocal quality. Heart:  RRR. No MRG. S1/S2 audible. Abdomen:   Soft, ND.  There is tenderness in the epigastric region to palpation. Pain also accelerates when patient asked to take a deep breath. No masses, no HSM, no bruits..   Rectal:  deferred   Musc/Skeltl:  no joint swelling, contracture or erythema. Extremities:   No pedal edema. Feet are warm.  Neurologic:   Alert and oriented 3. No limb weakness or asymmetric strength. No tremors. Skin:   No telangiectasia, sores, or rashes. Tattoos:   None Nodes:   No cervical adenopathy.   Psych:   Pleasant, relaxed.  Intake/Output from previous day:   Intake/Output this shift:    LAB RESULTS:  Recent Labs   04/03/14 2047 04/04/14 0357  WBC 15.5* 14.6*  HGB 12.6* 11.9*  HCT 37.9* 35.4*  PLT 178 163   BMET Lab Results  Component Value Date   NA 137 04/03/2014   NA 139 12/08/2012   NA 138 03/08/2011   K 3.4* 04/03/2014   K 3.4* 12/08/2012   K 3.2* 03/08/2011   CL 100 04/03/2014   CL 101 12/08/2012   CL 97 03/08/2011   CO2 23 04/03/2014   CO2 28 12/08/2012   CO2 31 03/08/2011   GLUCOSE 133* 04/03/2014   GLUCOSE 103* 12/08/2012   GLUCOSE 90 03/08/2011   BUN 20 04/03/2014   BUN 25* 12/08/2012   BUN 18 03/08/2011   CREATININE 5.22* 04/04/2014   CREATININE 4.47* 04/03/2014   CREATININE 4.93* 12/08/2012   CALCIUM 8.7 04/03/2014   CALCIUM 8.1* 12/08/2012   CALCIUM 9.8 03/08/2011   LFT  Recent Labs  04/03/14 2047  PROT 7.2  ALBUMIN 3.6  AST 23  ALT 19  ALKPHOS 79  BILITOT 0.7   Lipase     Component Value Date/Time   LIPASE 264* 01/28/2011 1844   RADIOLOGY STUDIES: Dg Chest 2 View 04/03/2014   CLINICAL DATA:  Acute onset of mid sternal chest pain and epigastric pain. Initial encounter.  EXAM: CHEST  2 VIEW  COMPARISON:  Chest radiograph performed 03/09/2011  FINDINGS: The lungs are well-aerated. Mild left basilar opacity may reflect atelectasis or mild pneumonia. There is no evidence of pleural effusion or pneumothorax.  The  heart is mildly enlarged. There is a unusual contour to the right paraspinal region at the lower thoracic spine and to the descending thoracic aortic contour, without definite evidence of a hiatal hernia on the lateral view. The thoracic aorta appears mildly more prominent on the frontal view in comparison to 2012. No acute osseous abnormalities are seen.  IMPRESSION: 1. Mild left basilar opacity may reflect atelectasis or mild pneumonia. 2. Unusual contour to the right paraspinal region at the lower thoracic spine, and to the descending thoracic aortic contour, without definite evidence of a hiatal hernia on the lateral view. The thoracic aorta appears  mildly more prominent on the current study. CT of the chest would be helpful for further evaluation, to exclude underlying mass or thoracic aortic aneurysm, as deemed clinically appropriate.   Electronically Signed   By: Garald Balding M.D.   On: 04/03/2014 21:40   Ct Angio Chest Aorta W/cm &/or Wo/cm Ct Angio Abd/[name] W/ And/or W/o 04/03/2014     COMPARISON:  CT abdomen and pelvis 01/28/2011  FINDINGS: CTA CHEST FINDINGS  Noncontrast images of the chest demonstrate calcification of the thoracic aorta and Coronary arteries as well as base of great vessels. Calcification in the visualized abdominal aorta and splenic artery. No evidence of intramural hematoma.  On views obtained during arterial phase after intravenous contrast injection are somewhat limited due to motion artifact. However, the aorta is normal in caliber without evidence of aneurysm. No evidence of aortic dissection, allowing for motion artifact. Central pulmonary arteries are opacified without evidence of significant pulmonary embolus.  Normal heart size. Great vessel origins are patent. Scattered mediastinal lymph nodes are not pathologically enlarged. Esophagus is decompressed. Sub cm hypo enhancing lesion in the left lobe of the thyroid. Thyroid gland is otherwise normal in size.  Evaluation of lungs is limited due to motion artifact. Probable dependent atelectasis in the lung bases. No focal consolidation is suggested. No pleural effusions. No pneumothorax. Airways appear patent.  Review of the MIP images confirms the above findings.  CTA ABDOMEN AND PELVIS FINDINGS  Abdominal aorta demonstrates diffuse calcification. No aortic aneurysm or dissection. The abdominal aorta, celiac axis, superior mesenteric artery, single bilateral renal arteries, inferior mesenteric artery, and bilateral iliac, external iliac, internal iliac, and common femoral arteries are patent although there is diffuse atherosclerotic change throughout. Renal nephrograms are  symmetrical.  The liver, spleen, gallbladder, pancreas, adrenal glands, inferior vena cava, and retroperitoneal lymph nodes are unremarkable. Both kidneys demonstrate diffuse parenchymal atrophy with subcentimeter cysts. No hydronephrosis or hydroureter. Stomach and small bowel are decompressed. Stool-filled colon without abnormal distention. No free air or free fluid in the abdomen.  Pelvis: The appendix is normal. Prostate gland is mildly enlarged, measuring 4.5 x 3.5 cm. Bladder wall is mildly thickened, possibly due to under distention. Bladder infection or bowel obstruction not excluded. Diverticulosis of the sigmoid colon without evidence of diverticulitis. No free or loculated pelvic fluid collections. No pelvic mass or lymphadenopathy. Small right inguinal hernia containing fat.  Bones: Degenerative changes throughout the thoracic and lumbar spine. Normal alignment. Degenerative changes in the shoulders. No destructive bone lesions are identified. No acute displaced fractures are suggested.  Review of the MIP images confirms the above findings.  IMPRESSION: No evidence of aneurysm or dissection in the thoracic or abdominal aorta. Diffuse atherosclerotic changes throughout. No definite evidence of any active pulmonary disease. No focal acute process demonstrated in the abdomen or pelvis. Prostate gland is enlarged. Small right inguinal hernia containing fat.  Diffuse parenchymal atrophy in both kidneys.   Electronically Signed   By: Lucienne Capers M.D.   On: 04/03/2014 23:30    ENDOSCOPIC STUDIES: 10/11/2013   EGD  Alexander [name]  For hx Barretts Irregular Z line.  Path: - INTESTINAL METAPLASIA WITH GOBLET CELLS (BARRETT'S ESOPHAGUS) - NEGATIVE FOR DYSPLASIA - SQUAMOCOLUMNAR MUCOSA WITH SQUAMOUS EPITHELIAL CHANGES CONSISTENT WITH REFLUX RELATED INJURY - NEGATIVE FOR MORPHOLOGICAL FEATURES OF EOSINOPHILIC ESOPHAGITIS Gastritis. Path: - MILD CHRONIC INACTIVE GASTRITIS (OXYNTIC MUCOSA) WITH PARIETAL  CELL PSEUDOHYPERTROPHY.  WARTHIN STARRY STAIN NEGATIVE FOR H. PYLORI  02/2013  Colonoscopy  Alexander [name].  5 polyps removed: ascending, hepatic flexure, transverse colon.  Left sided tics, large internal hemorrhoids.  Patholgy: tubular adenomas.   2012 Colonoscopy  Alexander [name] 5 polyps in tranxverse colon.  Sigmoid/descending tics.  Internal hemorrhoids.  Path: tubular adenoma.   2010 EGD  Alexander Alexander [name].  ? Barretts.  Pathology:  No Barretts.   2008  Colonoscopy.  Alexander Alexander [name].  Internal hemorrhoids. Descending polyp  2008  EGD Barretts esophagus. Biopsy of normal looking esophagus.   2006   EGD Gastric erythema.    2005  Colonoscopy Polyp splenic flexure. Biopsied, not removed.   2005 EGD  ? Barretts.  Gastritis.   2003 EGD  Gastritis .  Esophagitis.    2003  Colonoscopy Sigmoid tics.  Ascending and transverse polyps  IMPRESSION:   *  Acute chest pain, no prodrome.  Pattern of occurrence after 2 weeks of vigorous coughing, nasal congestion etc along with pain worsened by deep breathing and palpation of sternum/epigastrium all c/w musculoskeletal pain.  Per chest x-ray there is question of mild pneumonia versus atelectasis. On the CT this looks to be more atelectatic.   *  Hx Barretts esophagus, not seen on latest EGD of 7/16.   *  GERD.  sxs controlled with daily Omeprazole.   *  Hx adenomatous colon polyps.  Up to date with screening.   *  ESRD  *  Hypotension, acute on chronic.     [name]:     *  Stop the IV Protonix, replace with once daily oral Protonix.  *  Stop the clear liquid diet and advanced to renal diet.    Azucena Freed  04/04/2014, 8:28 AM Pager: [phone]      Attending physician's note   I have taken a history, examined the patient and reviewed the chart. I agree with the Advanced Practitioner's note, impression and recommendations. History and exam are all c/w musculoskeletal chest pain. Atelectasis or pneumonia suggested on CT as well. He had had some N/V  after arrival to ED likely related to morphine given in ED. Would avoid morphine. Clear liquids for now and advance diet as tolerated. No further GI evaluation needed. Resume once daily PPI for chronic GERD/Barrett's. GI signing off.  Ladene Artist, MD [facility]

## 2014-04-04 NOTE — ED Notes (Signed)
Admitting MD at bedside. Discussing consults with gastroenterology due to pt hx of GERD.

## 2014-04-04 NOTE — ED Notes (Signed)
Spoke with admitting MD regarding pt's active chest pain, reports he believes his chest pain is atypical and that he can go to the floor. Spoke with 2W charge RN & RR RN, unable to have pt go to telemetry floor. On-call MD paged.

## 2014-04-04 NOTE — Progress Notes (Signed)
[facility] PROGRESS NOTE  NAITHEN RIVENBURG VOJ:[id number] DOB: [birth year] DOA: 04/03/2014 PCP: Horatio Pel, MD  Assessment/Plan:  Chest pain -With risk for CAD given ESRD and AS, however, unlikely cardiac given negative initial workup/ relation with cough/ improvement with GI cocktail   -CTA abd/pelvis and CTA chest- no evidence of dissection/aneurysm/active pulmonary disease -Cards on board, rec's- continue ASA 81mg , statin -GI consulted -Cycled troponins negative; BNP 571.9; LDL normal -Repeat Echo pending  Leukocytosis -WBC 14.6, afebrile -CXR with questionable PNA, however CTA chest with no active pulmonary disease -will check UA  Hypotension - BP soft.  Given 1L bolus. Will check cortisol levels.    Aortic Stenosis -2D echo 08/2013- EF 50-55%, moderate LVH, mild stenosis; mitral valve calcified annulus, mild pulm HTN PAP 3mmhg  Atrial Fibrillation -Rate controlled -Continue amiodarone 200mg  daily   CVA -No current deficits -Continue ASA 81mg  daily  Dyslipidemia --Lipid panel normal -Continue Lipitor 20mg  daily  GERD -Continue Protonix 80 mg daily, Phoslo with meals  ESRD - Hemodialysis M, W, F -Will consult Nephro Friday for dialysis -Continue Cinaclcet, Phoslo  daily   Depression -stable, no complaints -Continue Prozac 40mg  daily   Macular degeneration -Continue Cosopt drops daily   DVT Prophylaxis SQ heparin Code Status: Full Family Communication: wife at bedside Disposition Plan: Inpatient   Consultants:  Cardiology  Gastroenterology  Procedures:  2D echo 04/04/13  Antibiotics:  None  HPI/Subjective: Omere Marti 74 yo male with PMH of ESRD on hemodialysis, atrial fibrillation on amiodarone, aortic stenosis, dyslipidemia, hypertension, history of CVA, GERD, that presents with chest pain that started the morning of admission.  Describes the pain as heaviness in the middle of his chest.  The pain lasted for one hour, and  recurred again later.  States pain is worth with breathing and coughing, and relates that he's had a cough for the past 2 weeks. He denies any radiation to his arms, shoulder, sob,  nausea, vomiting, or diaphoresis. He took aspirin without relief of his symptoms.He has a history of GERD, which she takes medication for.  In ED, initial cardiac markers were negative, EKG unrevealing.  Cardiology has been consultation, and patient is admitted for further workup.  States chest pain is persistent.  Denies any shortness of breath, or any other complaints.  Objective: Filed Vitals:   04/04/14 1030  BP: 94/55  Pulse: 84  Temp:   Resp: 19    Intake/Output Summary (Last 24 hours) at 04/04/14 1101 Last data filed at 04/04/14 0932  Gross per 24 hour  Intake      0 ml  Output      0 ml  Net      0 ml   There were no vitals filed for this visit.  Exam:  Gen: Alert Caucasian male in NAD Chest: clear to auscultate bilaterally, no ronchi or rales  Cardiac: Regular rate and rhythm, X9-B7, 1+ systolic murmur  Abdomen: soft, non tender, non distended, +bowel sounds. No guarding or rigidity  Extremities: Symmetrical in appearance without cyanosis or edema  Neurological: Alert awake oriented to time place and person.  Psychiatric: Appears normal.   Data Reviewed: Basic Metabolic Panel:  Recent Labs Lab 04/03/14 2047 04/04/14 0357  NA 137  --   K 3.4*  --   CL 100  --   CO2 23  --   GLUCOSE 133*  --   BUN 20  --   CREATININE 4.47* 5.22*  CALCIUM 8.7  --    Liver Function  Tests:  Recent Labs Lab 04/03/14 2047  AST 23  ALT 19  ALKPHOS 79  BILITOT 0.7  PROT 7.2  ALBUMIN 3.6   No results for input(s): LIPASE, AMYLASE in the last 168 hours. No results for input(s): AMMONIA in the last 168 hours. CBC:  Recent Labs Lab 04/03/14 2047 04/04/14 0357  WBC 15.5* 14.6*  HGB 12.6* 11.9*  HCT 37.9* 35.4*  MCV 103.6* 102.9*  PLT 178 163   Cardiac Enzymes:  Recent Labs Lab  04/03/14 2047 04/04/14 0357 04/04/14 0845  TROPONINI <0.03 0.03  0.03 0.04*   BNP (last 3 results) No results for input(s): PROBNP in the last 8760 hours. CBG: No results for input(s): GLUCAP in the last 168 hours.  No results found for this or any previous visit (from the past 240 hour(s)).   Studies: Dg Chest 2 View  04/03/2014   CLINICAL DATA:  Acute onset of mid sternal chest pain and epigastric pain. Initial encounter.  EXAM: CHEST  2 VIEW  COMPARISON:  Chest radiograph performed 03/09/2011  FINDINGS: The lungs are well-aerated. Mild left basilar opacity may reflect atelectasis or mild pneumonia. There is no evidence of pleural effusion or pneumothorax.  The heart is mildly enlarged. There is a unusual contour to the right paraspinal region at the lower thoracic spine and to the descending thoracic aortic contour, without definite evidence of a hiatal hernia on the lateral view. The thoracic aorta appears mildly more prominent on the frontal view in comparison to 2012. No acute osseous abnormalities are seen.  IMPRESSION: 1. Mild left basilar opacity may reflect atelectasis or mild pneumonia. 2. Unusual contour to the right paraspinal region at the lower thoracic spine, and to the descending thoracic aortic contour, without definite evidence of a hiatal hernia on the lateral view. The thoracic aorta appears mildly more prominent on the current study. CT of the chest would be helpful for further evaluation, to exclude underlying mass or thoracic aortic aneurysm, as deemed clinically appropriate.   Electronically Signed   By: Garald Balding M.D.   On: 04/03/2014 21:40   Ct Angio Chest Aorta W/cm &/or Wo/cm  04/03/2014   CLINICAL DATA:  Of patient complains of central non radiating sharp and stabbing chest pain with shortness of breath. Chest pain began this morning. Patient went in dialysis and chest pain subsided. After dialysis, chest pain began again. History of stroke.  EXAM: CT ANGIOGRAPHY  CHEST, ABDOMEN AND PELVIS  TECHNIQUE: Multidetector CT imaging through the chest, abdomen and pelvis was performed using the standard protocol during bolus administration of intravenous contrast. Multiplanar reconstructed images and MIPs were obtained and reviewed to evaluate the vascular anatomy.  CONTRAST:  1106mL OMNIPAQUE IOHEXOL 350 MG/ML SOLN  COMPARISON:  CT abdomen and pelvis 01/28/2011  FINDINGS: CTA CHEST FINDINGS  Noncontrast images of the chest demonstrate calcification of the thoracic aorta and Coronary arteries as well as base of great vessels. Calcification in the visualized abdominal aorta and splenic artery. No evidence of intramural hematoma.  On views obtained during arterial phase after intravenous contrast injection are somewhat limited due to motion artifact. However, the aorta is normal in caliber without evidence of aneurysm. No evidence of aortic dissection, allowing for motion artifact. Central pulmonary arteries are opacified without evidence of significant pulmonary embolus.  Normal heart size. Great vessel origins are patent. Scattered mediastinal lymph nodes are not pathologically enlarged. Esophagus is decompressed. Sub cm hypo enhancing lesion in the left lobe of the thyroid. Thyroid gland is  otherwise normal in size.  Evaluation of lungs is limited due to motion artifact. Probable dependent atelectasis in the lung bases. No focal consolidation is suggested. No pleural effusions. No pneumothorax. Airways appear patent.  Review of the MIP images confirms the above findings.  CTA ABDOMEN AND PELVIS FINDINGS  Abdominal aorta demonstrates diffuse calcification. No aortic aneurysm or dissection. The abdominal aorta, celiac axis, superior mesenteric artery, single bilateral renal arteries, inferior mesenteric artery, and bilateral iliac, external iliac, internal iliac, and common femoral arteries are patent although there is diffuse atherosclerotic change throughout. Renal nephrograms are  symmetrical.  The liver, spleen, gallbladder, pancreas, adrenal glands, inferior vena cava, and retroperitoneal lymph nodes are unremarkable. Both kidneys demonstrate diffuse parenchymal atrophy with subcentimeter cysts. No hydronephrosis or hydroureter. Stomach and small bowel are decompressed. Stool-filled colon without abnormal distention. No free air or free fluid in the abdomen.  Pelvis: The appendix is normal. Prostate gland is mildly enlarged, measuring 4.5 x 3.5 cm. Bladder wall is mildly thickened, possibly due to under distention. Bladder infection or bowel obstruction not excluded. Diverticulosis of the sigmoid colon without evidence of diverticulitis. No free or loculated pelvic fluid collections. No pelvic mass or lymphadenopathy. Small right inguinal hernia containing fat.  Bones: Degenerative changes throughout the thoracic and lumbar spine. Normal alignment. Degenerative changes in the shoulders. No destructive bone lesions are identified. No acute displaced fractures are suggested.  Review of the MIP images confirms the above findings.  IMPRESSION: No evidence of aneurysm or dissection in the thoracic or abdominal aorta. Diffuse atherosclerotic changes throughout. No definite evidence of any active pulmonary disease. No focal acute process demonstrated in the abdomen or pelvis. Prostate gland is enlarged. Small right inguinal hernia containing fat. Diffuse parenchymal atrophy in both kidneys.   Electronically Signed   By: Lucienne Capers M.D.   On: 04/03/2014 23:30   Ct Angio Abd/pel W/ And/or W/o  04/03/2014   CLINICAL DATA:  Of patient complains of central non radiating sharp and stabbing chest pain with shortness of breath. Chest pain began this morning. Patient went in dialysis and chest pain subsided. After dialysis, chest pain began again. History of stroke.  EXAM: CT ANGIOGRAPHY CHEST, ABDOMEN AND PELVIS  TECHNIQUE: Multidetector CT imaging through the chest, abdomen and pelvis was  performed using the standard protocol during bolus administration of intravenous contrast. Multiplanar reconstructed images and MIPs were obtained and reviewed to evaluate the vascular anatomy.  CONTRAST:  142mL OMNIPAQUE IOHEXOL 350 MG/ML SOLN  COMPARISON:  CT abdomen and pelvis 01/28/2011  FINDINGS: CTA CHEST FINDINGS  Noncontrast images of the chest demonstrate calcification of the thoracic aorta and Coronary arteries as well as base of great vessels. Calcification in the visualized abdominal aorta and splenic artery. No evidence of intramural hematoma.  On views obtained during arterial phase after intravenous contrast injection are somewhat limited due to motion artifact. However, the aorta is normal in caliber without evidence of aneurysm. No evidence of aortic dissection, allowing for motion artifact. Central pulmonary arteries are opacified without evidence of significant pulmonary embolus.  Normal heart size. Great vessel origins are patent. Scattered mediastinal lymph nodes are not pathologically enlarged. Esophagus is decompressed. Sub cm hypo enhancing lesion in the left lobe of the thyroid. Thyroid gland is otherwise normal in size.  Evaluation of lungs is limited due to motion artifact. Probable dependent atelectasis in the lung bases. No focal consolidation is suggested. No pleural effusions. No pneumothorax. Airways appear patent.  Review of the MIP images confirms  the above findings.  CTA ABDOMEN AND PELVIS FINDINGS  Abdominal aorta demonstrates diffuse calcification. No aortic aneurysm or dissection. The abdominal aorta, celiac axis, superior mesenteric artery, single bilateral renal arteries, inferior mesenteric artery, and bilateral iliac, external iliac, internal iliac, and common femoral arteries are patent although there is diffuse atherosclerotic change throughout. Renal nephrograms are symmetrical.  The liver, spleen, gallbladder, pancreas, adrenal glands, inferior vena cava, and  retroperitoneal lymph nodes are unremarkable. Both kidneys demonstrate diffuse parenchymal atrophy with subcentimeter cysts. No hydronephrosis or hydroureter. Stomach and small bowel are decompressed. Stool-filled colon without abnormal distention. No free air or free fluid in the abdomen.  Pelvis: The appendix is normal. Prostate gland is mildly enlarged, measuring 4.5 x 3.5 cm. Bladder wall is mildly thickened, possibly due to under distention. Bladder infection or bowel obstruction not excluded. Diverticulosis of the sigmoid colon without evidence of diverticulitis. No free or loculated pelvic fluid collections. No pelvic mass or lymphadenopathy. Small right inguinal hernia containing fat.  Bones: Degenerative changes throughout the thoracic and lumbar spine. Normal alignment. Degenerative changes in the shoulders. No destructive bone lesions are identified. No acute displaced fractures are suggested.  Review of the MIP images confirms the above findings.  IMPRESSION: No evidence of aneurysm or dissection in the thoracic or abdominal aorta. Diffuse atherosclerotic changes throughout. No definite evidence of any active pulmonary disease. No focal acute process demonstrated in the abdomen or pelvis. Prostate gland is enlarged. Small right inguinal hernia containing fat. Diffuse parenchymal atrophy in both kidneys.   Electronically Signed   By: Lucienne Capers M.D.   On: 04/03/2014 23:30    Scheduled Meds: . amiodarone  200 mg Oral Daily  . aspirin EC  325 mg Oral Daily  . atorvastatin  20 mg Oral q1800  . bromocriptine  5 mg Oral Daily  . calcium acetate  1,334 mg Oral TID WC  . cinacalcet  30 mg Oral Q breakfast  . dorzolamide-timolol  1 drop Right Eye BID  . FLUoxetine  40 mg Oral Daily  . heparin  5,000 Units Subcutaneous 3 times per day  . latanoprost  1 drop Both Eyes QHS  . pantoprazole  80 mg Oral Daily   Continuous Infusions:   Principal Problem:   Chest pain Active Problems:   PAF  (paroxysmal atrial fibrillation)   ESRD (end stage renal disease) on dialysis   Hyperlipidemia    Time spent: [place], [facility]  [facility] Pager [phone]. If 7PM-7AM, please contact night-coverage at [email or web address], password [facility] 04/04/2014, 11:01 AM  LOS: 1 day     I have personally examined the patient and reviewed the entire database. Agree with the above note and plan as outlined, any necessary changes made. 74year old male with CAD, ESRD on HD admitted with atypical CP, trop slightly positive x1, cardiology consulted. GI also consulted. Chronically low BP in 90's, now 80's, gave fluid bolus and placed on maintenance IVF.    Gi recommending oral PPI, renal diet. Endoscopy plans per GI.  Hoyte Ziebell M.D. [facility] 04/04/2014, 1:52 PM Pager: [phone]  If 7PM-7AM, please contact night-coverage [email or web address] Password [email or web address]

## 2014-04-04 NOTE — ED Notes (Signed)
Pt resting at this time. No apparent distress noted. Vitals stable. Chest pain 5/10 sharp stabbing. Pt refuses pain medication.

## 2014-04-04 NOTE — ED Notes (Signed)
Ordered breakfast tray  

## 2014-04-04 NOTE — ED Notes (Signed)
Spoke with Dr. Tana Coast regarding amiodarone and most recent BP 91/68. Per Dr. Tana Coast, amiodarone to be given.

## 2014-04-04 NOTE — Progress Notes (Signed)
Echocardiogram 2D Echocardiogram has been performed.  Alexander [name] 04/04/2014, 5:10 PM

## 2014-04-05 DIAGNOSIS — R072 Precordial pain: Secondary | ICD-10-CM

## 2014-04-05 DIAGNOSIS — J209 Acute bronchitis, unspecified: Secondary | ICD-10-CM | POA: Diagnosis present

## 2014-04-05 DIAGNOSIS — I480 Paroxysmal atrial fibrillation: Secondary | ICD-10-CM

## 2014-04-05 LAB — CBC
HEMATOCRIT: 33.9 % — AB (ref 39.0–52.0)
Hemoglobin: 11.2 g/dL — ABNORMAL LOW (ref 13.0–17.0)
MCH: 34.8 pg — ABNORMAL HIGH (ref 26.0–34.0)
MCHC: 33 g/dL (ref 30.0–36.0)
MCV: 105.3 fL — AB (ref 78.0–100.0)
PLATELETS: 141 10*3/uL — AB (ref 150–400)
RBC: 3.22 MIL/uL — ABNORMAL LOW (ref 4.22–5.81)
RDW: 15.8 % — ABNORMAL HIGH (ref 11.5–15.5)
WBC: 10.8 10*3/uL — AB (ref 4.0–10.5)

## 2014-04-05 LAB — BASIC METABOLIC PANEL
Anion gap: 13 (ref 5–15)
BUN: 41 mg/dL — ABNORMAL HIGH (ref 6–23)
CO2: 20 mmol/L (ref 19–32)
Calcium: 7.7 mg/dL — ABNORMAL LOW (ref 8.4–10.5)
Chloride: 101 mEq/L (ref 96–112)
Creatinine, Ser: 7.02 mg/dL — ABNORMAL HIGH (ref 0.50–1.35)
GFR calc Af Amer: 8 mL/min — ABNORMAL LOW (ref >90)
GFR calc non Af Amer: 7 mL/min — ABNORMAL LOW (ref >90)
Glucose, Bld: 98 mg/dL (ref 70–99)
Potassium: 3.9 mmol/L (ref 3.5–5.1)
Sodium: 134 mmol/L — ABNORMAL LOW (ref 135–145)

## 2014-04-05 LAB — PROTIME-INR
INR: 1.13 (ref 0.00–1.49)
Prothrombin Time: 14.6 seconds (ref 11.6–15.2)

## 2014-04-05 LAB — PHOSPHORUS: Phosphorus: 3.9 mg/dL (ref 2.3–4.6)

## 2014-04-05 LAB — TROPONIN I: Troponin I: 0.03 ng/mL (ref <0.031)

## 2014-04-05 MED ORDER — SODIUM CHLORIDE 0.9 % IV SOLN: 100.0000 mL | INTRAVENOUS | Status: DC | PRN

## 2014-04-05 MED ORDER — BENZONATATE 100 MG PO CAPS
200.0000 mg | ORAL_CAPSULE | Freq: Three times a day (TID) | ORAL | Status: DC
  Administered 2014-04-05 – 2014-04-06 (×3): 200 mg via ORAL
  Filled 2014-04-05 (×4): qty 2

## 2014-04-05 MED ORDER — TRAMADOL HCL 50 MG PO TABS: 50.0000 mg | ORAL_TABLET | Freq: Three times a day (TID) | ORAL | Status: DC | PRN

## 2014-04-05 MED ORDER — PANTOPRAZOLE SODIUM 40 MG PO TBEC: 40.0000 mg | DELAYED_RELEASE_TABLET | Freq: Every day | ORAL | Status: DC

## 2014-04-05 MED ORDER — LIDOCAINE HCL (PF) 1 % IJ SOLN: 5.0000 mL | INTRAMUSCULAR | Status: DC | PRN

## 2014-04-05 MED ORDER — PENTAFLUOROPROP-TETRAFLUOROETH EX AERO: 1.0000 "application " | INHALATION_SPRAY | CUTANEOUS | Status: DC | PRN

## 2014-04-05 MED ORDER — MIDODRINE HCL 5 MG PO TABS
ORAL_TABLET | ORAL | Status: AC
  Filled 2014-04-05: qty 2

## 2014-04-05 MED ORDER — MIDODRINE HCL 10 MG PO TABS: 10.0000 mg | ORAL_TABLET | Freq: Every day | ORAL | Status: DC

## 2014-04-05 MED ORDER — WARFARIN SODIUM 2.5 MG PO TABS: 2.5000 mg | ORAL_TABLET | Freq: Every evening | ORAL | Status: DC

## 2014-04-05 MED ORDER — LEVALBUTEROL HCL 0.63 MG/3ML IN NEBU: 0.6300 mg | INHALATION_SOLUTION | Freq: Four times a day (QID) | RESPIRATORY_TRACT | Status: DC | PRN

## 2014-04-05 MED ORDER — HYDROCODONE-HOMATROPINE 5-1.5 MG/5ML PO SYRP: 5.0000 mL | ORAL_SOLUTION | Freq: Four times a day (QID) | ORAL | Status: DC | PRN

## 2014-04-05 MED ORDER — HYDROCODONE-HOMATROPINE 5-1.5 MG/5ML PO SYRP
5.0000 mL | ORAL_SOLUTION | ORAL | Status: DC | PRN
  Administered 2014-04-05 – 2014-04-06 (×3): 5 mL via ORAL
  Filled 2014-04-05 (×3): qty 5

## 2014-04-05 MED ORDER — PREDNISONE 20 MG PO TABS
40.0000 mg | ORAL_TABLET | Freq: Every day | ORAL | Status: DC
  Administered 2014-04-06: 40 mg via ORAL
  Filled 2014-04-05: qty 2

## 2014-04-05 MED ORDER — LIDOCAINE-PRILOCAINE 2.5-2.5 % EX CREA
1.0000 "application " | TOPICAL_CREAM | CUTANEOUS | Status: DC | PRN
  Filled 2014-04-05: qty 5

## 2014-04-05 MED ORDER — PANTOPRAZOLE SODIUM 40 MG PO TBEC
40.0000 mg | DELAYED_RELEASE_TABLET | Freq: Two times a day (BID) | ORAL | Status: DC
  Administered 2014-04-05 – 2014-04-06 (×2): 40 mg via ORAL
  Filled 2014-04-05 (×2): qty 1

## 2014-04-05 MED ORDER — LORATADINE 10 MG PO TABS
10.0000 mg | ORAL_TABLET | Freq: Every day | ORAL | Status: DC
  Administered 2014-04-05 – 2014-04-06 (×2): 10 mg via ORAL
  Filled 2014-04-05 (×2): qty 1

## 2014-04-05 MED ORDER — PREDNISONE 20 MG PO TABS
60.0000 mg | ORAL_TABLET | Freq: Once | ORAL | Status: AC
  Administered 2014-04-05: 60 mg via ORAL
  Filled 2014-04-05: qty 3

## 2014-04-05 MED ORDER — HEPARIN SODIUM (PORCINE) 1000 UNIT/ML DIALYSIS: 1000.0000 [IU] | INTRAMUSCULAR | Status: DC | PRN

## 2014-04-05 MED ORDER — ALTEPLASE 2 MG IJ SOLR
2.0000 mg | Freq: Once | INTRAMUSCULAR | Status: DC | PRN
  Filled 2014-04-05: qty 2

## 2014-04-05 MED ORDER — LEVOFLOXACIN IN D5W 750 MG/150ML IV SOLN: 750.0000 mg | INTRAVENOUS | Status: DC

## 2014-04-05 MED ORDER — LEVALBUTEROL HCL 0.63 MG/3ML IN NEBU
0.6300 mg | INHALATION_SOLUTION | Freq: Four times a day (QID) | RESPIRATORY_TRACT | Status: DC
  Administered 2014-04-05 (×2): 0.63 mg via RESPIRATORY_TRACT
  Filled 2014-04-05 (×2): qty 3

## 2014-04-05 MED ORDER — LEVOFLOXACIN IN D5W 750 MG/150ML IV SOLN
750.0000 mg | Freq: Once | INTRAVENOUS | Status: AC
  Administered 2014-04-05: 750 mg via INTRAVENOUS
  Filled 2014-04-05: qty 150

## 2014-04-05 MED ORDER — LEVOFLOXACIN IN D5W 500 MG/100ML IV SOLN: 500.0000 mg | INTRAVENOUS | Status: DC

## 2014-04-05 MED ORDER — WARFARIN - PHARMACIST DOSING INPATIENT
Freq: Every day | Status: DC
  Administered 2014-04-05: 18:00:00

## 2014-04-05 MED ORDER — BROMOCRIPTINE MESYLATE 2.5 MG PO TABS
5.0000 mg | ORAL_TABLET | Freq: Every day | ORAL | Status: DC
  Administered 2014-04-05 – 2014-04-06 (×2): 5 mg via ORAL
  Filled 2014-04-05 (×2): qty 2

## 2014-04-05 MED ORDER — TRAMADOL HCL 50 MG PO TABS
50.0000 mg | ORAL_TABLET | Freq: Three times a day (TID) | ORAL | Status: DC | PRN
Start: 2014-04-05 — End: 2014-04-06

## 2014-04-05 MED ORDER — RENA-VITE PO TABS
1.0000 | ORAL_TABLET | Freq: Every day | ORAL | Status: DC
  Administered 2014-04-05: 1 via ORAL
  Filled 2014-04-05: qty 1

## 2014-04-05 MED ORDER — WARFARIN SODIUM 2.5 MG PO TABS
2.5000 mg | ORAL_TABLET | Freq: Every day | ORAL | Status: DC
  Administered 2014-04-05: 2.5 mg via ORAL
  Filled 2014-04-05: qty 1

## 2014-04-05 MED ORDER — COUMADIN BOOK
Freq: Once | Status: AC
  Administered 2014-04-05: 14:00:00
  Filled 2014-04-05: qty 1

## 2014-04-05 MED ORDER — NEPRO/CARBSTEADY PO LIQD
237.0000 mL | ORAL | Status: DC | PRN
  Filled 2014-04-05: qty 237

## 2014-04-05 NOTE — Progress Notes (Signed)
UR Completed Danashia Landers Graves-Bigelow, RN,BSN [phone]  

## 2014-04-05 NOTE — Progress Notes (Signed)
ANTICOAGULATION CONSULT NOTE - Initial Consult  Pharmacy Consult for coumadin Indication: atrial fibrillation  Allergies  Allergen Reactions  . Penicillins Swelling    Patient Measurements: Height: 5' 10.5" (179.1 cm) Weight: 203 lb 7.8 oz (92.3 kg) IBW/kg (Calculated) : 74.15  Vital Signs: Temp: 98 F (36.7 C) (01/08 1138) Temp Source: Oral (01/08 1138) BP: 122/73 mmHg (01/08 1138) Pulse Rate: 88 (01/08 1104)  Labs:  Recent Labs  04/03/14 2047 04/04/14 0357  04/04/14 1237 04/04/14 1830 04/05/14 0015  HGB 12.6* 11.9*  --   --   --  11.2*  HCT 37.9* 35.4*  --   --   --  33.9*  PLT 178 163  --   --   --  141*  CREATININE 4.47* 5.22*  --   --   --  7.02*  TROPONINI <0.03 0.03  0.03  < > 0.03 0.03 0.03  < > = values in this interval not displayed.  Estimated Creatinine Clearance: 10.8 mL/min (by C-G formula based on Cr of 7.02).   Medical History: Past Medical History  Diagnosis Date  . End stage renal disease     hemodialysis 3 times a week  . Seasonal allergies   . Hyperlipidemia   . Anemia   . Depression   . Macular degeneration     both eyes  . GERD (gastroesophageal reflux disease)   . Hypertension   . CVA (cerebral infarction)     2004/affected left side  . Peptic ulcer     bleeding, 1969  . Kidney stones   . Renal insufficiency   . Diverticulosis   . Tubular adenoma of colon 07/2001  . Barrett's esophagus 05/2003  . Stroke 2004  . Colon polyps   . Renal failure   . Hemodialysis patient   . Aortic stenosis 06/15/12    TEE - EF 22-97%; grade 1 diastolic dysfunction; mild/mod aortic valve stenosis; Mitral valve had calcified annulus, mild pulm htn PA peak pressure 50mmHg  . Atrial fibrillation 04/01/10    14 day event monitor - some sinus bradycardia, PACs and sinus tachycardia  . Atrial fibrillation 11/03/09    R/P MV - EF 66%; normal myocardial perfusion study, w/o chest pain or EKG changes for ischemia  . S/P epidural steroid injection     last   injection over 10 years ago  . Diabetes mellitus without complication    Assessment: Patient is a 74 y.o M with ESRD on HD MWF and is now on afib.  To start coumadin for stroke prevention.  Goal of Therapy:  INR 2-3    Plan:  - coumadin 2.5mg  x1 today per Dr. Stanford Breed - will be conservative with coumadin dose as patient has ESRD and is on amiodarone and levaquin - daily INR - Will adjust Levaquin to 750mg  IV x1 then 500mg  q48h for renal function  Tanmay Halteman P 04/05/2014,12:47 PM

## 2014-04-05 NOTE — Progress Notes (Addendum)
    Subjective:  Denies CP or dyspnea   Objective:  Filed Vitals:   04/05/14 0929 04/05/14 1000 04/05/14 1025 04/05/14 1053  BP: 101/78 143/75 123/75 132/69  Pulse: 92 93 93 90  Temp:      TempSrc:      Resp: 20 19    Height:      Weight:      SpO2: 96%       Intake/Output from previous day:  Intake/Output Summary (Last 24 hours) at 04/05/14 1053 Last data filed at 04/05/14 0500  Gross per 24 hour  Intake    240 ml  Output    100 ml  Net    140 ml    Physical Exam: Physical exam: Well-developed well-nourished in no acute distress.  Skin is warm and dry.  HEENT is normal.  Neck is supple. Chest is clear to auscultation with normal expansion.  Cardiovascular exam is irregular Abdominal exam nontender or distended. No masses palpated. Extremities show no edema. neuro grossly intact    Lab Results: Basic Metabolic Panel:  Recent Labs  04/03/14 2047 04/04/14 0357 04/05/14 0015 04/05/14 0725  NA 137  --  134*  --   K 3.4*  --  3.9  --   CL 100  --  101  --   CO2 23  --  20  --   GLUCOSE 133*  --  98  --   BUN 20  --  41*  --   CREATININE 4.47* 5.22* 7.02*  --   CALCIUM 8.7  --  7.7*  --   PHOS  --   --   --  3.9   CBC:  Recent Labs  04/04/14 0357 04/05/14 0015  WBC 14.6* 10.8*  HGB 11.9* 11.2*  HCT 35.4* 33.9*  MCV 102.9* 105.3*  PLT 163 141*   Cardiac Enzymes:  Recent Labs  04/04/14 1237 04/04/14 1830 04/05/14 0015  TROPONINI 0.03 0.03 0.03     Assessment/Plan:  1 chest pain-symptoms seem most consistent with musculoskeletal pain. Enzymes unremarkable. No plans for further ischemia evaluation. 2 paroxysmal atrial fibrillation-follow-up electrocardiogram shows recurrent atrial fibrillation; rate controlled. The patient is not having symptoms. He has multiple embolic risk factors and his chadsvasc score is 4; would discontinue aspirin and treat with Coumadin (begin 2.5 mg daily in light of interaction with amiodarone) with goal INR 2-3.  He will need follow-up in the Coumadin clinic.Continue amiodarone for now as atrial fibrillation appears to be paroxysmal. Note TSH is normal. Echocardiogram shows normal LV function with mild to moderate aortic stenosis. 3 history of mild to moderate aortic stenosis. No change on recent echocardiogram. 4 end-stage renal disease-nephrology following for dialysis.  Kirk Ruths 04/05/2014, 10:53 AM

## 2014-04-05 NOTE — Discharge Instructions (Signed)

## 2014-04-05 NOTE — Procedures (Signed)
Pt seen on HD.   Ap 170 Vp 140.  BFR 400.  Says CP is somewhat better.  On 4K bath.

## 2014-04-05 NOTE — Progress Notes (Signed)
[facility] PROGRESS NOTE  EN[place] CRAIGHEAD PPJ:[id number] DOB: [birth year] DOA: 04/03/2014 PCP: Horatio Pel, MD   Brief history of present illness  Patient is a 74 year old male with ESRD on hemodialysis, chronic hypotension, hyperlipidemia, CVA presented with atypical chest pain, more of a heaviness. Patient also reported history of reflux. He reported having a cold over the last 2 weeks but not on any antibiotics. Patient reported chest pain worse with deep breathing and coughing. CT angiogram of the chest, abdomen and pelvis was negative for dissection and with no acute pathology. Slight improvement with GI cocktail.  Assessment/Plan:  Chest pain - Resolved, risk for CAD given ESRD and AS, however, unlikely cardiac given negative initial workup/ relation with cough/ improvement with GI cocktail. CTA abd/pelvis and CTA chest showed no evidence of dissection/aneurysm/active pulmonary disease - Cardiology was consulted, recommended low-dose aspirin with statin, no further cardiac workup as less likely to be cardiac etiology -Gastroenterology was also consulted, felt likely musculoskeletal chest pain, no further GI evaluation, once daily PPI . - cycled troponins negative; BNP 571.9; LDL normalEcho showed EF of 60-65%, wall motion normal, mild to moderate aortic stenosis, no regional wall motion of the mellitus.   Leukocytosis/Acute bronchitis - Patient continues to have coughing, causes him to have vomiting. Discussed in detail with the patient's daughters and wife at the bedside, they reported that his coughing spells with wheezing has been going on for 2 weeks. I feel that his symptoms at presentation could be all related to acute bronchitis. Placed on Levaquin, Hycodan cough syrup, Tessalon Perles, prednisone with taper.    Hypotension -Patient has chronically low BP, placed on midodrine   Aortic Stenosis -2D echo 08/2013- EF 50-55%, moderate LVH, mild stenosis; mitral valve  calcified annulus, mild pulm HTN PAP 31mmhg  Atrial Fibrillation -Rate controlled - Continue amiodarone 200mg  daily  - Cardiology recommended starting Coumadin, placed on 2.5 mg daily starting today. Coumadin teaching per pharmacy.   CVA -No current deficits, will not need aspirin if starting on Coumadin  - DC aspirin after starting Coumadin today   Dyslipidemia --Lipid panel normal -Continue Lipitor 20mg  daily  GERD -Continue Protonix 80 mg daily, Phoslo with meals  ESRD - Hemodialysis M, W, F -Will consult Nephro Friday for dialysis -Continue Cinaclcet, Phoslo  daily   Depression -stable, no complaints -Continue Prozac 40mg  daily   Macular degeneration -Continue Cosopt drops daily   DVT Prophylaxis SQ heparin  Code Status: Full  Family Communication: Discussed in detail with wife and daughters at bedside  Disposition Plan: Inpatient, Hopefully DC tomorrow   Consultants:  Cardiology  Gastroenterology  Procedures:  2D echo 04/04/13  Antibiotics:  IV Levaquin  Subjective:  patient seen and examined twice, once during hemodialysis, doing well with no complaints  Subsequently seen after hemodialysis, with family at the bedside, having coughing spells. Patient's family reported was not able to tolerate diet due to coughing and that triggers vomiting  Objective: Filed Vitals:   04/05/14 1331  BP: 101/64  Pulse: 88  Temp: 98.2 F (36.8 C)  Resp: 20    Intake/Output Summary (Last 24 hours) at 04/05/14 1335 Last data filed at 04/05/14 1300  Gross per 24 hour  Intake    440 ml  Output   2110 ml  Net  -1670 ml   Filed Weights   04/05/14 0628 04/05/14 0700 04/05/14 1104  Weight: 95.119 kg (209 lb 11.2 oz) 94.1 kg (207 lb 7.3 oz) 92.3 kg (203 lb 7.8 oz)  Exam:  Gen: Alert and oriented, NAD  Chest:  mild wheezing in the upper lobes after coughing  Cardiac: Regular rate and rhythm, C3-U1, 1+ systolic murmur  Abdomen: soft, non tender, non  distended, +bowel sounds. No guarding or rigidity  Extremities: Symmetrical in appearance without cyanosis or edema  Neurological: Alert awake oriented to time place and person.  Psychiatric: Appears normal.   Data Reviewed: Basic Metabolic Panel:  Recent Labs Lab 04/03/14 2047 04/04/14 0357 04/05/14 0015 04/05/14 0725  NA 137  --  134*  --   K 3.4*  --  3.9  --   CL 100  --  101  --   CO2 23  --  20  --   GLUCOSE 133*  --  98  --   BUN 20  --  41*  --   CREATININE 4.47* 5.22* 7.02*  --   CALCIUM 8.7  --  7.7*  --   PHOS  --   --   --  3.9   Liver Function Tests:  Recent Labs Lab 04/03/14 2047  AST 23  ALT 19  ALKPHOS 79  BILITOT 0.7  PROT 7.2  ALBUMIN 3.6   No results for input(s): LIPASE, AMYLASE in the last 168 hours. No results for input(s): AMMONIA in the last 168 hours. CBC:  Recent Labs Lab 04/03/14 2047 04/04/14 0357 04/05/14 0015  WBC 15.5* 14.6* 10.8*  HGB 12.6* 11.9* 11.2*  HCT 37.9* 35.4* 33.9*  MCV 103.6* 102.9* 105.3*  PLT 178 163 141*   Cardiac Enzymes:  Recent Labs Lab 04/04/14 0357 04/04/14 0845 04/04/14 1237 04/04/14 1830 04/05/14 0015  TROPONINI 0.03  0.03 0.04* 0.03 0.03 0.03   BNP (last 3 results) No results for input(s): PROBNP in the last 8760 hours. CBG: No results for input(s): GLUCAP in the last 168 hours.  Recent Results (from the past 240 hour(s))  MRSA PCR Screening     Status: None   Collection Time: 04/04/14  7:08 PM  Result Value Ref Range Status   MRSA by PCR NEGATIVE NEGATIVE Final    Comment:        The GeneXpert MRSA Assay (FDA approved for NASAL specimens only), is one component of a comprehensive MRSA colonization surveillance program. It is not intended to diagnose MRSA infection nor to guide or monitor treatment for MRSA infections.      Studies: Dg Chest 2 View  04/03/2014   CLINICAL DATA:  Acute onset of mid sternal chest pain and epigastric pain. Initial encounter.  EXAM: CHEST  2 VIEW   COMPARISON:  Chest radiograph performed 03/09/2011  FINDINGS: The lungs are well-aerated. Mild left basilar opacity may reflect atelectasis or mild pneumonia. There is no evidence of pleural effusion or pneumothorax.  The heart is mildly enlarged. There is a unusual contour to the right paraspinal region at the lower thoracic spine and to the descending thoracic aortic contour, without definite evidence of a hiatal hernia on the lateral view. The thoracic aorta appears mildly more prominent on the frontal view in comparison to 2012. No acute osseous abnormalities are seen.  IMPRESSION: 1. Mild left basilar opacity may reflect atelectasis or mild pneumonia. 2. Unusual contour to the right paraspinal region at the lower thoracic spine, and to the descending thoracic aortic contour, without definite evidence of a hiatal hernia on the lateral view. The thoracic aorta appears mildly more prominent on the current study. CT of the chest would be helpful for further evaluation, to exclude underlying  mass or thoracic aortic aneurysm, as deemed clinically appropriate.   Electronically Signed   By: Garald Balding M.D.   On: 04/03/2014 21:40   Ct Angio Chest Aorta W/cm &/or Wo/cm  04/03/2014   CLINICAL DATA:  Of patient complains of central non radiating sharp and stabbing chest pain with shortness of breath. Chest pain began this morning. Patient went in dialysis and chest pain subsided. After dialysis, chest pain began again. History of stroke.  EXAM: CT ANGIOGRAPHY CHEST, ABDOMEN AND PELVIS  TECHNIQUE: Multidetector CT imaging through the chest, abdomen and pelvis was performed using the standard protocol during bolus administration of intravenous contrast. Multiplanar reconstructed images and MIPs were obtained and reviewed to evaluate the vascular anatomy.  CONTRAST:  173mL OMNIPAQUE IOHEXOL 350 MG/ML SOLN  COMPARISON:  CT abdomen and pelvis 01/28/2011  FINDINGS: CTA CHEST FINDINGS  Noncontrast images of the chest  demonstrate calcification of the thoracic aorta and Coronary arteries as well as base of great vessels. Calcification in the visualized abdominal aorta and splenic artery. No evidence of intramural hematoma.  On views obtained during arterial phase after intravenous contrast injection are somewhat limited due to motion artifact. However, the aorta is normal in caliber without evidence of aneurysm. No evidence of aortic dissection, allowing for motion artifact. Central pulmonary arteries are opacified without evidence of significant pulmonary embolus.  Normal heart size. Great vessel origins are patent. Scattered mediastinal lymph nodes are not pathologically enlarged. Esophagus is decompressed. Sub cm hypo enhancing lesion in the left lobe of the thyroid. Thyroid gland is otherwise normal in size.  Evaluation of lungs is limited due to motion artifact. Probable dependent atelectasis in the lung bases. No focal consolidation is suggested. No pleural effusions. No pneumothorax. Airways appear patent.  Review of the MIP images confirms the above findings.  CTA ABDOMEN AND PELVIS FINDINGS  Abdominal aorta demonstrates diffuse calcification. No aortic aneurysm or dissection. The abdominal aorta, celiac axis, superior mesenteric artery, single bilateral renal arteries, inferior mesenteric artery, and bilateral iliac, external iliac, internal iliac, and common femoral arteries are patent although there is diffuse atherosclerotic change throughout. Renal nephrograms are symmetrical.  The liver, spleen, gallbladder, pancreas, adrenal glands, inferior vena cava, and retroperitoneal lymph nodes are unremarkable. Both kidneys demonstrate diffuse parenchymal atrophy with subcentimeter cysts. No hydronephrosis or hydroureter. Stomach and small bowel are decompressed. Stool-filled colon without abnormal distention. No free air or free fluid in the abdomen.  Pelvis: The appendix is normal. Prostate gland is mildly enlarged,  measuring 4.5 x 3.5 cm. Bladder wall is mildly thickened, possibly due to under distention. Bladder infection or bowel obstruction not excluded. Diverticulosis of the sigmoid colon without evidence of diverticulitis. No free or loculated pelvic fluid collections. No pelvic mass or lymphadenopathy. Small right inguinal hernia containing fat.  Bones: Degenerative changes throughout the thoracic and lumbar spine. Normal alignment. Degenerative changes in the shoulders. No destructive bone lesions are identified. No acute displaced fractures are suggested.  Review of the MIP images confirms the above findings.  IMPRESSION: No evidence of aneurysm or dissection in the thoracic or abdominal aorta. Diffuse atherosclerotic changes throughout. No definite evidence of any active pulmonary disease. No focal acute process demonstrated in the abdomen or pelvis. Prostate gland is enlarged. Small right inguinal hernia containing fat. Diffuse parenchymal atrophy in both kidneys.   Electronically Signed   By: Lucienne Capers M.D.   On: 04/03/2014 23:30   Ct Angio Abd/pel W/ And/or W/o  04/03/2014   CLINICAL DATA:  Of patient complains of central non radiating sharp and stabbing chest pain with shortness of breath. Chest pain began this morning. Patient went in dialysis and chest pain subsided. After dialysis, chest pain began again. History of stroke.  EXAM: CT ANGIOGRAPHY CHEST, ABDOMEN AND PELVIS  TECHNIQUE: Multidetector CT imaging through the chest, abdomen and pelvis was performed using the standard protocol during bolus administration of intravenous contrast. Multiplanar reconstructed images and MIPs were obtained and reviewed to evaluate the vascular anatomy.  CONTRAST:  129mL OMNIPAQUE IOHEXOL 350 MG/ML SOLN  COMPARISON:  CT abdomen and pelvis 01/28/2011  FINDINGS: CTA CHEST FINDINGS  Noncontrast images of the chest demonstrate calcification of the thoracic aorta and Coronary arteries as well as base of great vessels.  Calcification in the visualized abdominal aorta and splenic artery. No evidence of intramural hematoma.  On views obtained during arterial phase after intravenous contrast injection are somewhat limited due to motion artifact. However, the aorta is normal in caliber without evidence of aneurysm. No evidence of aortic dissection, allowing for motion artifact. Central pulmonary arteries are opacified without evidence of significant pulmonary embolus.  Normal heart size. Great vessel origins are patent. Scattered mediastinal lymph nodes are not pathologically enlarged. Esophagus is decompressed. Sub cm hypo enhancing lesion in the left lobe of the thyroid. Thyroid gland is otherwise normal in size.  Evaluation of lungs is limited due to motion artifact. Probable dependent atelectasis in the lung bases. No focal consolidation is suggested. No pleural effusions. No pneumothorax. Airways appear patent.  Review of the MIP images confirms the above findings.  CTA ABDOMEN AND PELVIS FINDINGS  Abdominal aorta demonstrates diffuse calcification. No aortic aneurysm or dissection. The abdominal aorta, celiac axis, superior mesenteric artery, single bilateral renal arteries, inferior mesenteric artery, and bilateral iliac, external iliac, internal iliac, and common femoral arteries are patent although there is diffuse atherosclerotic change throughout. Renal nephrograms are symmetrical.  The liver, spleen, gallbladder, pancreas, adrenal glands, inferior vena cava, and retroperitoneal lymph nodes are unremarkable. Both kidneys demonstrate diffuse parenchymal atrophy with subcentimeter cysts. No hydronephrosis or hydroureter. Stomach and small bowel are decompressed. Stool-filled colon without abnormal distention. No free air or free fluid in the abdomen.  Pelvis: The appendix is normal. Prostate gland is mildly enlarged, measuring 4.5 x 3.5 cm. Bladder wall is mildly thickened, possibly due to under distention. Bladder infection  or bowel obstruction not excluded. Diverticulosis of the sigmoid colon without evidence of diverticulitis. No free or loculated pelvic fluid collections. No pelvic mass or lymphadenopathy. Small right inguinal hernia containing fat.  Bones: Degenerative changes throughout the thoracic and lumbar spine. Normal alignment. Degenerative changes in the shoulders. No destructive bone lesions are identified. No acute displaced fractures are suggested.  Review of the MIP images confirms the above findings.  IMPRESSION: No evidence of aneurysm or dissection in the thoracic or abdominal aorta. Diffuse atherosclerotic changes throughout. No definite evidence of any active pulmonary disease. No focal acute process demonstrated in the abdomen or pelvis. Prostate gland is enlarged. Small right inguinal hernia containing fat. Diffuse parenchymal atrophy in both kidneys.   Electronically Signed   By: Lucienne Capers M.D.   On: 04/03/2014 23:30    Scheduled Meds: . amiodarone  200 mg Oral Daily  . atorvastatin  20 mg Oral q1800  . benzonatate  200 mg Oral TID  . bromocriptine  5 mg Oral Daily  . calcium acetate  1,334 mg Oral TID WC  . cinacalcet  30 mg Oral Q breakfast  .  coumadin book   Does not apply Once  . dorzolamide-timolol  1 drop Right Eye BID  . FLUoxetine  40 mg Oral Daily  . heparin  5,000 Units Subcutaneous 3 times per day  . latanoprost  1 drop Both Eyes QHS  . levalbuterol  0.63 mg Nebulization Q6H  . [START ON 04/07/2014] levofloxacin (LEVAQUIN) IV  500 mg Intravenous Q48H  . levofloxacin (LEVAQUIN) IV  750 mg Intravenous Once  . loratadine  10 mg Oral Daily  . midodrine  10 mg Oral Daily  . multivitamin  1 tablet Oral QHS  . pantoprazole  40 mg Oral BID AC  . [START ON 04/06/2014] predniSONE  40 mg Oral Q breakfast  . predniSONE  60 mg Oral Once  . warfarin  2.5 mg Oral q1800  . Warfarin - Pharmacist Dosing Inpatient   Does not apply q25    Peytan Andringa M.D. [facility] 04/05/2014,  1:35 PM Pager: [phone]  If 7PM-7AM, please contact night-coverage [email or web address] Password [email or web address]

## 2014-04-06 LAB — BASIC METABOLIC PANEL
Anion gap: 9 (ref 5–15)
BUN: 33 mg/dL — ABNORMAL HIGH (ref 6–23)
CALCIUM: 7.9 mg/dL — AB (ref 8.4–10.5)
CHLORIDE: 98 meq/L (ref 96–112)
CO2: 28 mmol/L (ref 19–32)
CREATININE: 5.6 mg/dL — AB (ref 0.50–1.35)
GFR calc Af Amer: 10 mL/min — ABNORMAL LOW (ref >90)
GFR calc non Af Amer: 9 mL/min — ABNORMAL LOW (ref >90)
GLUCOSE: 126 mg/dL — AB (ref 70–99)
POTASSIUM: 4.5 mmol/L (ref 3.5–5.1)
SODIUM: 135 mmol/L (ref 135–145)

## 2014-04-06 LAB — PROTIME-INR
INR: 1.1 (ref 0.00–1.49)
PROTHROMBIN TIME: 14.4 s (ref 11.6–15.2)

## 2014-04-06 LAB — CBC
HEMATOCRIT: 33.8 % — AB (ref 39.0–52.0)
Hemoglobin: 11.1 g/dL — ABNORMAL LOW (ref 13.0–17.0)
MCH: 33.8 pg (ref 26.0–34.0)
MCHC: 32.8 g/dL (ref 30.0–36.0)
MCV: 103 fL — ABNORMAL HIGH (ref 78.0–100.0)
PLATELETS: 166 10*3/uL (ref 150–400)
RBC: 3.28 MIL/uL — ABNORMAL LOW (ref 4.22–5.81)
RDW: 15.5 % (ref 11.5–15.5)
WBC: 11.1 10*3/uL — ABNORMAL HIGH (ref 4.0–10.5)

## 2014-04-06 MED ORDER — LEVOFLOXACIN 500 MG PO TABS: 500.0000 mg | ORAL_TABLET | ORAL | Status: DC

## 2014-04-06 MED ORDER — PREDNISONE 10 MG PO TABS: ORAL_TABLET | ORAL | Status: DC

## 2014-04-06 MED ORDER — LEVALBUTEROL TARTRATE 45 MCG/ACT IN AERO: 1.0000 | INHALATION_SPRAY | Freq: Three times a day (TID) | RESPIRATORY_TRACT | Status: DC | PRN

## 2014-04-06 MED ORDER — LORATADINE 10 MG PO TABS: 10.0000 mg | ORAL_TABLET | Freq: Every day | ORAL | Status: DC

## 2014-04-06 MED ORDER — OMEPRAZOLE 20 MG PO CPDR: 20.0000 mg | DELAYED_RELEASE_CAPSULE | Freq: Two times a day (BID) | ORAL | Status: DC

## 2014-04-06 MED ORDER — TRAMADOL HCL 50 MG PO TABS: 50.0000 mg | ORAL_TABLET | Freq: Three times a day (TID) | ORAL | Status: DC | PRN

## 2014-04-06 MED ORDER — BENZONATATE 200 MG PO CAPS: 200.0000 mg | ORAL_CAPSULE | Freq: Three times a day (TID) | ORAL | Status: DC | PRN

## 2014-04-06 NOTE — Discharge Summary (Signed)
Physician Discharge Summary  Patient ID: Alexander [name] MRN: [id number] DOB/AGE: 27-Feb-1941 74 y.o.  Admit date: 04/03/2014 Discharge date: 04/06/2014  Primary Care Physician:  Horatio Pel, MD  Discharge Diagnoses:    . Chest pain . PAF (paroxysmal atrial fibrillation) . Acute bronchitis GERD  Consults: Cardiology, Dr. Stanford Breed Gastroenterology, Dr. Fuller Plan    Recommendations for Outpatient Follow-up:  Patient is started on Coumadin by cardiology service, will need close following of PT/INR. Appointment scheduled on 04/08/14 with cardiology Coumadin clinic.    TESTS THAT NEED FOLLOW-UP PT/INR   DIET: Mechanical soft diet    Allergies:   Allergies  Allergen Reactions  . Penicillins Swelling     Discharge Medications:   Medication List    STOP taking these medications        aspirin 81 MG tablet     Cetirizine HCl 10 MG Caps     dextromethorphan 15 MG/5ML syrup      TAKE these medications        acetaminophen 500 MG tablet  Commonly known as:  TYLENOL  Take 1,000 mg by mouth every 6 (six) hours as needed for pain.     amiodarone 200 MG tablet  Commonly known as:  PACERONE  Take 1 tablet (200 mg total) by mouth daily.     atorvastatin 40 MG tablet  Commonly known as:  LIPITOR  Take 20 mg by mouth daily.     b complex-vitamin c-folic acid 0.8 MG Tabs tablet  Take 0.8 mg by mouth daily.     benzonatate 200 MG capsule  Commonly known as:  TESSALON  Take 1 capsule (200 mg total) by mouth 3 (three) times daily as needed for cough.     bromocriptine 5 MG capsule  Commonly known as:  PARLODEL  Take 5 mg by mouth daily.     calcium acetate 667 MG capsule  Commonly known as:  PHOSLO  Take 1,334 mg by mouth 3 (three) times daily with meals.     cinacalcet 30 MG tablet  Commonly known as:  SENSIPAR  Take 30 mg by mouth daily.     dorzolamide-timolol 22.3-6.8 MG/ML ophthalmic solution  Commonly known as:  COSOPT  Place 1 drop into the right  eye 2 (two) times daily.     FLUoxetine 20 MG capsule  Commonly known as:  PROZAC  Take 40 mg by mouth daily.     HYDROcodone-homatropine 5-1.5 MG/5ML syrup  Commonly known as:  HYCODAN  Take 5 mLs by mouth every 6 (six) hours as needed for cough.     latanoprost 0.005 % ophthalmic solution  Commonly known as:  XALATAN  Place 1 drop into both eyes at bedtime.     levalbuterol 45 MCG/ACT inhaler  Commonly known as:  XOPENEX HFA  Inhale 1 puff into the lungs every 8 (eight) hours as needed for wheezing.     levofloxacin 500 MG tablet  Commonly known as:  LEVAQUIN  Take 1 tablet (500 mg total) by mouth every other day.  Start taking on:  04/07/2014     loratadine 10 MG tablet  Commonly known as:  CLARITIN  Take 1 tablet (10 mg total) by mouth daily.     midodrine 10 MG tablet  Commonly known as:  PROAMATINE  Take 1 tablet (10 mg total) by mouth daily.     omeprazole 20 MG capsule  Commonly known as:  PRILOSEC  Take 1 capsule (20 mg total) by mouth 2 (two) times daily  before a meal.     pantoprazole 40 MG tablet  Commonly known as:  PROTONIX  Take 1 tablet (40 mg total) by mouth daily.     predniSONE 10 MG tablet  Commonly known as:  DELTASONE  - Prednisone dosing: Take  Prednisone 40mg  (4 tabs) x 2 days, then taper to 30mg  (3 tabs) x 3 days, then 20mg  (2 tabs) x 3days, then 10mg  (1 tab) x 3days, then OFF.  -   - Dispense:  26 tabs, refills: None  Start taking on:  04/07/2014     traMADol 50 MG tablet  Commonly known as:  ULTRAM  Take 1 tablet (50 mg total) by mouth every 8 (eight) hours as needed for moderate pain.     warfarin 2.5 MG tablet  Commonly known as:  COUMADIN  Take 1 tablet (2.5 mg total) by mouth every evening. Dose to be adjusted at the coumadin clinic         Brief H and P: For complete details please refer to admission H and P, but in brief, patient is a 74 year old male with ESRD on hemodialysis, atrial fibrillation on amiodarone, aortic  stenosis, dyslipidemia, hypertension, CVA, GERD presented with chest pain that started the morning of admission. He described the pain as heaviness in the middle of his chest, lasted for about an hour and reoccurred again. Pain was worse with breathing and coughing, reported coughing for last 2 weeks. He took aspirin without any relief of his symptoms, in ED initial cardiac markers were negative. Cardiology was consulted and patient was admitted for further workup.  Hospital Course:   Patient is a 74 year old male with ESRD on hemodialysis, chronic hypotension, hyperlipidemia, CVA presented with atypical chest pain, more of a heaviness. Patient also reported history of reflux. He reported having a cold over the last 2 weeks but not on any antibiotics. Patient reported chest pain worse with deep breathing and coughing. CT angiogram of the chest, abdomen and pelvis was negative for dissection and with no acute pathology. Slight improvement with GI cocktail.  Chest pain - Resolved, risk for CAD given ESRD and AS, however, unlikely cardiac given negative initial workup/ relation with cough/ improvement with GI cocktail. CTA abd/pelvis and CTA chest showed no evidence of dissection/aneurysm/active pulmonary disease. Cardiology was consulted, recommended low-dose aspirin with statin, no further cardiac workup as less likely to be cardiac etiology. Gastroenterology was also consulted, felt likely musculoskeletal chest pain, no further GI evaluation, once daily PPI . Cycled troponins negative; BNP 571.9; LDL normalEcho showed EF of 60-65%, wall motion normal, mild to moderate aortic stenosis, no regional wall motion of the mellitus.   Leukocytosis/Acute bronchitis - Patient continued to have coughing, caused him to have vomiting. Discussed in detail with the patient's daughters and wife at the bedside, they reported that his coughing spells with wheezing has been going on for 2 weeks. I feel that his symptoms at  presentation could be all related to acute bronchitis. Placed on Levaquin, Hycodan cough syrup, Tessalon Perles, prednisone with taper. The patient had remarkable improvement in his symptoms and was able to tolerate solid diet. Shortness of breath had significantly improved at the time of discharge.  Hypotension -Patient has chronically low BP, placed on midodrine. This can be changed to every other day or on the days of dialysis by his PCP or nephrologist.  Aortic Stenosis -2D echo 08/2013- EF 50-55%, moderate LVH, mild stenosis; mitral valve calcified annulus, mild pulm HTN PAP 43mmhg  Atrial Fibrillation -  Rate controlled - Continue amiodarone 200mg  daily  - Cardiology recommended starting Coumadin, placed on 2.5 mg daily . Patient has appointment to check PT/INR on 04/08/14  CVA -No current deficits, will not need aspirin if starting on Coumadin   Dyslipidemia --Lipid panel normal -Continue Lipitor 20mg  daily  GERD -Continue Protonix 80 mg daily, Phoslo with meals  ESRD - Hemodialysis M, W, F, patient received his regular hemodialysis on Friday.  Depression -stable, no complaints -Continue Prozac 40mg  daily   Macular degeneration -Continue Cosopt drops daily   Day of Discharge BP 118/90 mmHg  Pulse 86  Temp(Src) 98.4 F (36.9 C) (Oral)  Resp 16  Ht 5' 10.5" (1.791 m)  Wt 92.171 kg (203 lb 3.2 oz)  BMI 28.73 kg/m2  SpO2 90%  Physical Exam: General: Alert and awake oriented x3 not in any acute distress. HEENT: anicteric sclera, pupils reactive to light and accommodation CVS: S1-S2 clear no murmur rubs or gallops Chest: clear to auscultation bilaterally, no wheezing rales or rhonchi Abdomen: soft nontender, nondistended, normal bowel sounds Extremities: no cyanosis, clubbing or edema noted bilaterally Neuro: Cranial nerves II-XII intact, no focal neurological deficits   The results of significant diagnostics from this hospitalization (including imaging,  microbiology, ancillary and laboratory) are listed below for reference.    LAB RESULTS: Basic Metabolic Panel:  Recent Labs Lab 04/05/14 0015 04/05/14 0725 04/06/14 0340  NA 134*  --  135  K 3.9  --  4.5  CL 101  --  98  CO2 20  --  28  GLUCOSE 98  --  126*  BUN 41*  --  33*  CREATININE 7.02*  --  5.60*  CALCIUM 7.7*  --  7.9*  PHOS  --  3.9  --    Liver Function Tests:  Recent Labs Lab 04/03/14 2047  AST 23  ALT 19  ALKPHOS 79  BILITOT 0.7  PROT 7.2  ALBUMIN 3.6   No results for input(s): LIPASE, AMYLASE in the last 168 hours. No results for input(s): AMMONIA in the last 168 hours. CBC:  Recent Labs Lab 04/05/14 0015 04/06/14 0340  WBC 10.8* 11.1*  HGB 11.2* 11.1*  HCT 33.9* 33.8*  MCV 105.3* 103.0*  PLT 141* 166   Cardiac Enzymes:  Recent Labs Lab 04/04/14 1830 04/05/14 0015  TROPONINI 0.03 0.03   BNP: Invalid input(s): POCBNP CBG: No results for input(s): GLUCAP in the last 168 hours.  Significant Diagnostic Studies:  Dg Chest 2 View  04/03/2014   CLINICAL DATA:  Acute onset of mid sternal chest pain and epigastric pain. Initial encounter.  EXAM: CHEST  2 VIEW  COMPARISON:  Chest radiograph performed 03/09/2011  FINDINGS: The lungs are well-aerated. Mild left basilar opacity may reflect atelectasis or mild pneumonia. There is no evidence of pleural effusion or pneumothorax.  The heart is mildly enlarged. There is a unusual contour to the right paraspinal region at the lower thoracic spine and to the descending thoracic aortic contour, without definite evidence of a hiatal hernia on the lateral view. The thoracic aorta appears mildly more prominent on the frontal view in comparison to 2012. No acute osseous abnormalities are seen.  IMPRESSION: 1. Mild left basilar opacity may reflect atelectasis or mild pneumonia. 2. Unusual contour to the right paraspinal region at the lower thoracic spine, and to the descending thoracic aortic contour, without definite  evidence of a hiatal hernia on the lateral view. The thoracic aorta appears mildly more prominent on the current study. CT  of the chest would be helpful for further evaluation, to exclude underlying mass or thoracic aortic aneurysm, as deemed clinically appropriate.   Electronically Signed   By: Garald Balding M.D.   On: 04/03/2014 21:40   Ct Angio Chest Aorta W/cm &/or Wo/cm  04/03/2014   CLINICAL DATA:  Of patient complains of central non radiating sharp and stabbing chest pain with shortness of breath. Chest pain began this morning. Patient went in dialysis and chest pain subsided. After dialysis, chest pain began again. History of stroke.  EXAM: CT ANGIOGRAPHY CHEST, ABDOMEN AND PELVIS  TECHNIQUE: Multidetector CT imaging through the chest, abdomen and pelvis was performed using the standard protocol during bolus administration of intravenous contrast. Multiplanar reconstructed images and MIPs were obtained and reviewed to evaluate the vascular anatomy.  CONTRAST:  12mL OMNIPAQUE IOHEXOL 350 MG/ML SOLN  COMPARISON:  CT abdomen and pelvis 01/28/2011  FINDINGS: CTA CHEST FINDINGS  Noncontrast images of the chest demonstrate calcification of the thoracic aorta and Coronary arteries as well as base of great vessels. Calcification in the visualized abdominal aorta and splenic artery. No evidence of intramural hematoma.  On views obtained during arterial phase after intravenous contrast injection are somewhat limited due to motion artifact. However, the aorta is normal in caliber without evidence of aneurysm. No evidence of aortic dissection, allowing for motion artifact. Central pulmonary arteries are opacified without evidence of significant pulmonary embolus.  Normal heart size. Great vessel origins are patent. Scattered mediastinal lymph nodes are not pathologically enlarged. Esophagus is decompressed. Sub cm hypo enhancing lesion in the left lobe of the thyroid. Thyroid gland is otherwise normal in size.   Evaluation of lungs is limited due to motion artifact. Probable dependent atelectasis in the lung bases. No focal consolidation is suggested. No pleural effusions. No pneumothorax. Airways appear patent.  Review of the MIP images confirms the above findings.  CTA ABDOMEN AND PELVIS FINDINGS  Abdominal aorta demonstrates diffuse calcification. No aortic aneurysm or dissection. The abdominal aorta, celiac axis, superior mesenteric artery, single bilateral renal arteries, inferior mesenteric artery, and bilateral iliac, external iliac, internal iliac, and common femoral arteries are patent although there is diffuse atherosclerotic change throughout. Renal nephrograms are symmetrical.  The liver, spleen, gallbladder, pancreas, adrenal glands, inferior vena cava, and retroperitoneal lymph nodes are unremarkable. Both kidneys demonstrate diffuse parenchymal atrophy with subcentimeter cysts. No hydronephrosis or hydroureter. Stomach and small bowel are decompressed. Stool-filled colon without abnormal distention. No free air or free fluid in the abdomen.  Pelvis: The appendix is normal. Prostate gland is mildly enlarged, measuring 4.5 x 3.5 cm. Bladder wall is mildly thickened, possibly due to under distention. Bladder infection or bowel obstruction not excluded. Diverticulosis of the sigmoid colon without evidence of diverticulitis. No free or loculated pelvic fluid collections. No pelvic mass or lymphadenopathy. Small right inguinal hernia containing fat.  Bones: Degenerative changes throughout the thoracic and lumbar spine. Normal alignment. Degenerative changes in the shoulders. No destructive bone lesions are identified. No acute displaced fractures are suggested.  Review of the MIP images confirms the above findings.  IMPRESSION: No evidence of aneurysm or dissection in the thoracic or abdominal aorta. Diffuse atherosclerotic changes throughout. No definite evidence of any active pulmonary disease. No focal acute  process demonstrated in the abdomen or pelvis. Prostate gland is enlarged. Small right inguinal hernia containing fat. Diffuse parenchymal atrophy in both kidneys.   Electronically Signed   By: Lucienne Capers M.D.   On: 04/03/2014 23:30   Ct  Angio Abd/pel W/ And/or W/o  04/03/2014   CLINICAL DATA:  Of patient complains of central non radiating sharp and stabbing chest pain with shortness of breath. Chest pain began this morning. Patient went in dialysis and chest pain subsided. After dialysis, chest pain began again. History of stroke.  EXAM: CT ANGIOGRAPHY CHEST, ABDOMEN AND PELVIS  TECHNIQUE: Multidetector CT imaging through the chest, abdomen and pelvis was performed using the standard protocol during bolus administration of intravenous contrast. Multiplanar reconstructed images and MIPs were obtained and reviewed to evaluate the vascular anatomy.  CONTRAST:  110mL OMNIPAQUE IOHEXOL 350 MG/ML SOLN  COMPARISON:  CT abdomen and pelvis 01/28/2011  FINDINGS: CTA CHEST FINDINGS  Noncontrast images of the chest demonstrate calcification of the thoracic aorta and Coronary arteries as well as base of great vessels. Calcification in the visualized abdominal aorta and splenic artery. No evidence of intramural hematoma.  On views obtained during arterial phase after intravenous contrast injection are somewhat limited due to motion artifact. However, the aorta is normal in caliber without evidence of aneurysm. No evidence of aortic dissection, allowing for motion artifact. Central pulmonary arteries are opacified without evidence of significant pulmonary embolus.  Normal heart size. Great vessel origins are patent. Scattered mediastinal lymph nodes are not pathologically enlarged. Esophagus is decompressed. Sub cm hypo enhancing lesion in the left lobe of the thyroid. Thyroid gland is otherwise normal in size.  Evaluation of lungs is limited due to motion artifact. Probable dependent atelectasis in the lung bases. No  focal consolidation is suggested. No pleural effusions. No pneumothorax. Airways appear patent.  Review of the MIP images confirms the above findings.  CTA ABDOMEN AND PELVIS FINDINGS  Abdominal aorta demonstrates diffuse calcification. No aortic aneurysm or dissection. The abdominal aorta, celiac axis, superior mesenteric artery, single bilateral renal arteries, inferior mesenteric artery, and bilateral iliac, external iliac, internal iliac, and common femoral arteries are patent although there is diffuse atherosclerotic change throughout. Renal nephrograms are symmetrical.  The liver, spleen, gallbladder, pancreas, adrenal glands, inferior vena cava, and retroperitoneal lymph nodes are unremarkable. Both kidneys demonstrate diffuse parenchymal atrophy with subcentimeter cysts. No hydronephrosis or hydroureter. Stomach and small bowel are decompressed. Stool-filled colon without abnormal distention. No free air or free fluid in the abdomen.  Pelvis: The appendix is normal. Prostate gland is mildly enlarged, measuring 4.5 x 3.5 cm. Bladder wall is mildly thickened, possibly due to under distention. Bladder infection or bowel obstruction not excluded. Diverticulosis of the sigmoid colon without evidence of diverticulitis. No free or loculated pelvic fluid collections. No pelvic mass or lymphadenopathy. Small right inguinal hernia containing fat.  Bones: Degenerative changes throughout the thoracic and lumbar spine. Normal alignment. Degenerative changes in the shoulders. No destructive bone lesions are identified. No acute displaced fractures are suggested.  Review of the MIP images confirms the above findings.  IMPRESSION: No evidence of aneurysm or dissection in the thoracic or abdominal aorta. Diffuse atherosclerotic changes throughout. No definite evidence of any active pulmonary disease. No focal acute process demonstrated in the abdomen or pelvis. Prostate gland is enlarged. Small right inguinal hernia  containing fat. Diffuse parenchymal atrophy in both kidneys.   Electronically Signed   By: Lucienne Capers M.D.   On: 04/03/2014 23:30    2D ECHO: Study Conclusions  - Left ventricle: The cavity size was normal. There was mild concentric hypertrophy. Systolic function was normal. The estimated ejection fraction was in the range of 60% to 65%. Wall motion was normal; there were no  regional wall motion abnormalities. - Aortic valve: Cusp separation was reduced. There was mild to moderate stenosis. Valve area (VTI): 1.25 cm^2. Valve area (Vmax): 1.26 cm^2. Valve area (Vmean): 1.22 cm^2. - Mitral valve: Moderately calcified annulus. - Left atrium: The atrium was mildly dilated. - Right ventricle: The cavity size was mildly dilated. Wall thickness was normal. - Right atrium: The atrium was mildly dilated. - Pulmonary arteries: Systolic pressure was mildly increased. PA peak pressure: 39 mm Hg (S).  Impressions:  - Since prior studu, there is no advancement in aortic stenosis.  Disposition and Follow-up:     Discharge Instructions    Diet - low sodium heart healthy    Complete by:  As directed      Discharge instructions    Complete by:  As directed   Soft bland diet     Discharge instructions    Complete by:  As directed   DIET: SOFT BLAND DIET for a week atleast  Please continue Tesselon perles for cough TID for this week, after that, as needed.   While on prednisone, please continue prilosec twice a day.     Increase activity slowly    Complete by:  As directed      Increase activity slowly    Complete by:  As directed             DISPOSITION: home   DISCHARGE FOLLOW-UP Follow-up Information    Follow up with [facility] On 04/08/2014.   Why:  at 7:40 AM   Contact information:   [address] [place] [address]       Follow up with Horatio Pel, MD. Schedule an appointment as soon as possible for a  visit in 2 weeks.   Specialty:  Internal Medicine   Contact information:   [address] [place] [address] [phone]       Follow up with Pixie Casino, MD. Schedule an appointment as soon as possible for a visit in 2 weeks.   Specialty:  Cardiology   Why:  for hospital follow-up   Contact information:   [address] [place] [address] [phone]        Time spent on Discharge: 40 mins  Signed:   Lynnel Zanetti M.D. [facility] 04/06/2014, 2:13 PM Pager: [phone]

## 2014-04-06 NOTE — Progress Notes (Signed)
ANTICOAGULATION CONSULT NOTE - Follow Up Consult  Pharmacy Consult for Coumadin Indication: atrial fibrillation  Allergies  Allergen Reactions  . Penicillins Swelling    Patient Measurements: Height: 5' 10.5" (179.1 cm) Weight: 203 lb 3.2 oz (92.171 kg) IBW/kg (Calculated) : 74.15  Vital Signs: Temp: 98.4 F (36.9 C) (01/09 1159) Temp Source: Oral (01/09 1159) BP: 118/90 mmHg (01/09 1159) Pulse Rate: 86 (01/09 1159)  Labs:  Recent Labs  04/04/14 0357  04/04/14 1237 04/04/14 1830 04/05/14 0015 04/05/14 1250 04/06/14 0340  HGB 11.9*  --   --   --  11.2*  --  11.1*  HCT 35.4*  --   --   --  33.9*  --  33.8*  PLT 163  --   --   --  141*  --  166  LABPROT  --   --   --   --   --  14.6 14.4  INR  --   --   --   --   --  1.13 1.10  CREATININE 5.22*  --   --   --  7.02*  --  5.60*  TROPONINI 0.03  0.03  < > 0.03 0.03 0.03  --   --   < > = values in this interval not displayed.  Estimated Creatinine Clearance: 13.5 mL/min (by C-G formula based on Cr of 5.6).   Medications:  Coumadin 2.5mg  given on 1/8  Assessment: 74 year old male with atrial fibrillation who started anticoagulation with Coumadin on 1/8.  His INR is unchanged after his first dose of Coumadin as would be expected. Note a lower dose of Coumadin is being utilized due to drug interactions with amiodarone and levofloxacin.  Goal of Therapy:  INR 2-3   Plan:  Continue Coumadin 2.5mg  daily Daily PT/INR  Legrand Como, Pharm.D., BCPS, AAHIVP Clinical Pharmacist Phone: [phone] or [phone] 04/06/2014, 12:31 PM

## 2014-04-06 NOTE — Progress Notes (Signed)
Pt tolerated regular diet well, no c/o nausea or vomiting, will continue to monitor

## 2014-04-08 ENCOUNTER — Ambulatory Visit (INDEPENDENT_AMBULATORY_CARE_PROVIDER_SITE_OTHER): Payer: Medicare Other | Admitting: Pharmacist Clinician (PhC)/ Clinical Pharmacy Specialist

## 2014-04-08 ENCOUNTER — Encounter: Payer: Medicare Other | Admitting: Pharmacist Clinician (PhC)/ Clinical Pharmacy Specialist

## 2014-04-08 DIAGNOSIS — Z7901 Long term (current) use of anticoagulants: Secondary | ICD-10-CM | POA: Diagnosis not present

## 2014-04-08 DIAGNOSIS — I480 Paroxysmal atrial fibrillation: Secondary | ICD-10-CM

## 2014-04-08 LAB — POCT INR: INR: 1.4

## 2014-04-10 ENCOUNTER — Encounter: Payer: Medicare Other | Admitting: Pharmacist Clinician (PhC)/ Clinical Pharmacy Specialist

## 2014-04-11 DIAGNOSIS — R0789 Other chest pain: Secondary | ICD-10-CM | POA: Diagnosis not present

## 2014-04-11 DIAGNOSIS — J208 Acute bronchitis due to other specified organisms: Secondary | ICD-10-CM | POA: Diagnosis not present

## 2014-04-11 DIAGNOSIS — R109 Unspecified abdominal pain: Secondary | ICD-10-CM | POA: Diagnosis not present

## 2014-04-12 ENCOUNTER — Ambulatory Visit (INDEPENDENT_AMBULATORY_CARE_PROVIDER_SITE_OTHER): Payer: Medicare Other | Admitting: Pharmacist Clinician (PhC)/ Clinical Pharmacy Specialist

## 2014-04-12 DIAGNOSIS — I480 Paroxysmal atrial fibrillation: Secondary | ICD-10-CM

## 2014-04-12 DIAGNOSIS — Z7901 Long term (current) use of anticoagulants: Secondary | ICD-10-CM

## 2014-04-12 LAB — POCT INR: INR: 1.5

## 2014-04-16 ENCOUNTER — Encounter: Payer: Self-pay | Admitting: Internal Medicine

## 2014-04-16 ENCOUNTER — Ambulatory Visit (INDEPENDENT_AMBULATORY_CARE_PROVIDER_SITE_OTHER): Payer: Medicare Other | Admitting: Internal Medicine

## 2014-04-16 VITALS — BP 160/90 | HR 93 | Ht 70.5 in | Wt 206.6 lb

## 2014-04-16 DIAGNOSIS — I480 Paroxysmal atrial fibrillation: Secondary | ICD-10-CM

## 2014-04-16 DIAGNOSIS — D689 Coagulation defect, unspecified: Secondary | ICD-10-CM

## 2014-04-16 DIAGNOSIS — R5383 Other fatigue: Secondary | ICD-10-CM | POA: Diagnosis not present

## 2014-04-16 DIAGNOSIS — Z01812 Encounter for preprocedural laboratory examination: Secondary | ICD-10-CM

## 2014-04-16 DIAGNOSIS — I350 Nonrheumatic aortic (valve) stenosis: Secondary | ICD-10-CM

## 2014-04-16 DIAGNOSIS — Z992 Dependence on renal dialysis: Secondary | ICD-10-CM

## 2014-04-16 DIAGNOSIS — N186 End stage renal disease: Secondary | ICD-10-CM

## 2014-04-16 DIAGNOSIS — I739 Peripheral vascular disease, unspecified: Secondary | ICD-10-CM | POA: Diagnosis not present

## 2014-04-16 DIAGNOSIS — Z7901 Long term (current) use of anticoagulants: Secondary | ICD-10-CM

## 2014-04-16 MED ORDER — AMIODARONE HCL 200 MG PO TABS: 400.0000 mg | ORAL_TABLET | Freq: Every day | ORAL | Status: DC

## 2014-04-16 NOTE — Patient Instructions (Addendum)
Your physician has recommended you make the following change in your medication: INCREASE amiodarone to 400mg  daily   You will need weekly INR checks with [name] (pharmacist) for 1 month >> please schedule your next INR check for 04/19/14 or 04/22/14 with [name]  [name] has ordered a cardioversion - this will be done late February/early March (your INR checks need to be therapeutic or in range for 4 weeks) >> this is done at [facility]  >> you will need to have lab work done 3-5 days prior to your procedure

## 2014-04-16 NOTE — Progress Notes (Signed)
04/16/2014   PCP: Alexander Pel, MD   Chief Complaint  Patient presents with  . Follow-up    post hospital (chest pain, SOB)    HPI:  74 year old white married male, previously followed by Dr. Rollene [name]. I had actually admitted him to the hospital back in the Spring and will be assuming care of him as his cardiologist. He has a history of atrial fibrillation on chronic amiodarone therapy, chronic renal failure with end-stage renal disease on hemodialysis Monday Wednesday Friday, diabetes mellitus, hyperlipidemia, TIA in 2004. Patient's most recent recent nuclear stress test was August 2014 and was normal. Quit smoking 14 years ago. He was at hemodialysis 12/08/12 when he suddenly developed a 10 out of 10 chest pressure which was more in the epigastric region. The patient stated that he felt like "they pumped to quick" at hemodialysis. By the time EMS arrived at hemodialysis his pain had resolved and he has had none since. He reported some diaphoresis but otherwise denied nausea, vomiting, fever, shortness of breath, orthopnea, dizziness, PND, cough, congestion, abdominal pain, hematochezia, melena, lower extremity edema, claudication.  His troponin's were negative.  He was seen by Dr. Debara [name] and felt stable for discharge. He is back today as stated.  Patient has been quite well since the emergency room visit he denies any further chest pain, no lightheadedness, no dizziness, no complaints at all.  We reviewed his carotid dopplers and his abd. Ultrasound.  He reports that he continues to go to [facility] for some followup of his carotid Dopplers. 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 He unfortunately is not a candidate for renal transplant 22 peripheral arterial disease and vessel calcification. His last echocardiogram as mentioned was in February of 2014 which showed at least moderate aortic stenosis.  Alexander [name] returns today for  followup. He's been doing generally well at dialysis but does have episodes of hypotension. He feels dizzy during those episodes. This is certainly related to his aortic stenosis. Recent echo in June 2015 demonstrated moderate aortic stenosis with a valve area of 1-1.1 cm, however was interpreted as mild. On physical exam the murmur is mid to late peaking consistent with more moderate to severe aortic stenosis. He continues to be fairly stable with his episodes of hypotension however we may need to consider adding low-dose Midodrine prior to his dialysis sessions. He denies any chest pain. He has not had recent screening laboratory work for his amiodarone therapy.  I saw Alexander [name] in the office today. He was recently hospitalized with atypical chest pain. He was thought to have accommodation of acute bronchitis and productive cough. He was also noted to be back in atrial fibrillation, therefore is thought to be paroxysmal. He says he feels somewhat more tired in atrial fibrillation. Based on this he was started back on warfarin and recently was seen in our anticoagulation clinic.  Allergies  Allergen Reactions  . Penicillins Swelling    Current Outpatient Prescriptions  Medication Sig Dispense Refill  . acetaminophen (TYLENOL) 500 MG tablet Take 1,000 mg by mouth every 6 (six) hours as needed for pain.    [name] amiodarone (PACERONE) 200 MG tablet Take 2 tablets (400 mg total) by mouth daily. 180 tablet 0  . atorvastatin (LIPITOR) 40 MG tablet Take 20 mg by mouth daily.    [name] b complex-vitamin c-folic acid (NEPHRO-VITE) 0.8 MG TABS Take 0.8 mg by mouth daily.     [name]  benzonatate (TESSALON) 200 MG capsule Take 1 capsule (200 mg total) by mouth 3 (three) times daily as needed for cough. 60 capsule 0  . bromocriptine (PARLODEL) 5 MG capsule Take 5 mg by mouth daily.     . calcium acetate (PHOSLO) 667 MG capsule Take 1,334 mg by mouth 3 (three) times daily with meals.    . cinacalcet (SENSIPAR) 30 MG tablet  Take 30 mg by mouth daily.    . dorzolamide-timolol (COSOPT) 22.3-6.8 MG/ML ophthalmic solution Place 1 drop into the right eye 2 (two) times daily.    [name] FLUoxetine (PROZAC) 20 MG capsule Take 40 mg by mouth daily.     [name] HYDROcodone-homatropine (HYCODAN) 5-1.5 MG/5ML syrup Take 5 mLs by mouth every 6 (six) hours as needed for cough. 120 mL 0  . latanoprost (XALATAN) 0.005 % ophthalmic solution Place 1 drop into both eyes at bedtime.    . levalbuterol (XOPENEX HFA) 45 MCG/ACT inhaler Inhale 1 puff into the lungs every 8 (eight) hours as needed for wheezing. 1 Inhaler 2  . loratadine (CLARITIN) 10 MG tablet Take 1 tablet (10 mg total) by mouth daily. 30 tablet 2  . midodrine (PROAMATINE) 10 MG tablet Take 1 tablet (10 mg total) by mouth daily. 30 tablet 4  . omeprazole (PRILOSEC) 20 MG capsule Take 1 capsule (20 mg total) by mouth 2 (two) times daily before a meal. 60 capsule 3  . pantoprazole (PROTONIX) 40 MG tablet Take 1 tablet (40 mg total) by mouth daily. 30 tablet 3  . predniSONE (DELTASONE) 10 MG tablet Prednisone dosing: Take  Prednisone 40mg  (4 tabs) x 2 days, then taper to 30mg  (3 tabs) x 3 days, then 20mg  (2 tabs) x 3days, then 10mg  (1 tab) x 3days, then OFF.  Dispense:  26 tabs, refills: None 26 tablet 0  . traMADol (ULTRAM) 50 MG tablet Take 1 tablet (50 mg total) by mouth every 8 (eight) hours as needed for moderate pain. 60 tablet 0  . warfarin (COUMADIN) 2.5 MG tablet Take 1 tablet (2.5 mg total) by mouth every evening. Dose to be adjusted at the coumadin clinic (Patient taking differently: Take 2.5 mg by mouth every evening. 1 and 1/2 tablets daily. Dose to be adjusted at the coumadin clinic) 30 tablet 0   No current facility-administered medications for this visit.    Past Medical History  Diagnosis Date  . End stage renal disease     hemodialysis 3 times a week  . Seasonal allergies   . Hyperlipidemia   . Anemia   . Depression   . Macular degeneration     both eyes  .  GERD (gastroesophageal reflux disease)   . Hypertension   . CVA (cerebral infarction)     2004/affected left side  . Peptic ulcer     bleeding, 1969  . Kidney stones   . Renal insufficiency   . Diverticulosis   . Tubular adenoma of colon 07/2001  . Barrett's esophagus 05/2003  . Stroke 2004  . Colon polyps   . Renal failure   . Hemodialysis patient   . Aortic stenosis 06/15/12    TEE - EF 85-88%; grade 1 diastolic dysfunction; mild/mod aortic valve stenosis; Mitral valve had calcified annulus, mild pulm htn PA peak pressure 45mmHg  . Atrial fibrillation 04/01/10    14 day event monitor - some sinus bradycardia, PACs and sinus tachycardia  . Atrial fibrillation 11/03/09    R/P MV - EF 66%; normal myocardial perfusion study, w/o  chest pain or EKG changes for ischemia  . S/P epidural steroid injection     last  injection over 10 years ago  . Diabetes mellitus without complication     Past Surgical History  Procedure Laterality Date  . Av fistula placement  2009  . Laminectomy  1969  . Tonsillectomy  1964  . 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 Cystoscopy  several times    kidney stones  . Back surgery      YOM:AYOKHTX:HF colds or fevers, no weight changes Skin:no rashes or ulcers HEENT:no blurred vision, no congestion CV:see HPI PUL:see HPI GI:no diarrhea, constipation or melena, no indigestion GU:no hematuria, no dysuria MS:no joint pain, no claudication Neuro:no syncope, no lightheadedness Endo:+ diabetes, no thyroid disease  PHYSICAL EXAM BP 160/90 mmHg  Pulse 93  Ht 5' 10.5" (1.791 m)  Wt 206 lb 9.6 oz (93.713 kg)  BMI 29.22 kg/m2 General:Pleasant affect, NAD Skin:Warm and dry, brisk capillary refill HEENT:normocephalic, sclera clear, mucus membranes moist Neck:supple, no JVD, + carotid bruits bil. Heart:S1S2 RRR, there is a mid-peaking 3/6 systolic aortic murmur at the RUSB, that does not radiate Lungs:clear without rales, rhonchi, or wheezes SFS:ELTR, non  tender, + BS, do not palpate liver spleen or masses Ext:no lower ext edema, 2+ pedal pulses, 2+ radial pulses Neuro:alert and oriented, MAE, follows commands, + facial symmetry  EKG:  Atrial fibrillation at 93  ASSESSMENT AND PLAN Patient Active Problem List   Diagnosis Date Noted  . Long-term (current) use of anticoagulants 04/08/2014  . Acute bronchitis 04/05/2014  . Hyperlipidemia 04/04/2014  . ESRD (end stage renal disease) on dialysis 03/13/2013  . PAF (paroxysmal atrial fibrillation) 12/12/2012  . PAD (peripheral artery disease) 12/12/2012  . Chest pain 12/08/2012  . Aortic stenosis 06/15/2012  . Personal history of colonic polyps 02/03/2011  . Esophageal reflux 02/03/2011  . Anemia, unspecified 02/03/2011   PLAN: 1.  Mr. Ribas has at least moderate aortic stenosis. Recently he's noted to go back in atrial fibrillation with controlled ventricular response. He says he feels a little more fatigue, and this could be from decreased preload in the setting of at least moderate aortic stenosis. He also of course has end-stage renal disease on dialysis. We could consider an attempt at reestablishing sinus rhythm. He was last noted to be in sinus rhythm in the beginning of December when I saw him in the office. In order to increase our chances of that I would recommend increasing his amiodarone to 400 mg daily. We'll continue to check his warfarin levels and he'll need to have weekly INRs that are therapeutic between 2 and 3 for at least one month prior to possible cardioversion. We discussed risks and benefits of cardioversion and wishes to proceed.  Alexander Casino, MD, [facility] Attending Cardiologist [place]

## 2014-04-17 DIAGNOSIS — E1129 Type 2 diabetes mellitus with other diabetic kidney complication: Secondary | ICD-10-CM | POA: Diagnosis not present

## 2014-04-19 ENCOUNTER — Ambulatory Visit: Payer: Medicare Other | Admitting: Pharmacist Clinician (PhC)/ Clinical Pharmacy Specialist

## 2014-04-23 ENCOUNTER — Other Ambulatory Visit: Payer: Self-pay | Admitting: Physical Medicine and Rehabilitation

## 2014-04-23 DIAGNOSIS — M961 Postlaminectomy syndrome, not elsewhere classified: Secondary | ICD-10-CM

## 2014-04-24 DIAGNOSIS — I482 Chronic atrial fibrillation: Secondary | ICD-10-CM | POA: Diagnosis not present

## 2014-04-26 ENCOUNTER — Other Ambulatory Visit: Payer: Self-pay | Admitting: Pharmacist Clinician (PhC)/ Clinical Pharmacy Specialist

## 2014-04-26 ENCOUNTER — Ambulatory Visit (INDEPENDENT_AMBULATORY_CARE_PROVIDER_SITE_OTHER): Payer: Medicare Other | Admitting: Pharmacist Clinician (PhC)/ Clinical Pharmacy Specialist

## 2014-04-26 ENCOUNTER — Telehealth: Payer: Self-pay | Admitting: Pharmacist Clinician (PhC)/ Clinical Pharmacy Specialist

## 2014-04-26 DIAGNOSIS — I482 Chronic atrial fibrillation: Secondary | ICD-10-CM | POA: Diagnosis not present

## 2014-04-26 DIAGNOSIS — I480 Paroxysmal atrial fibrillation: Secondary | ICD-10-CM

## 2014-04-26 DIAGNOSIS — Z7901 Long term (current) use of anticoagulants: Secondary | ICD-10-CM

## 2014-04-26 LAB — PROTIME-INR: INR: 3.6 — AB (ref 0.9–1.1)

## 2014-04-26 MED ORDER — WARFARIN SODIUM 2.5 MG PO TABS: ORAL_TABLET | ORAL | Status: DC

## 2014-04-26 NOTE — Telephone Encounter (Signed)
She also stated that she help his Heparin and took another INR

## 2014-04-26 NOTE — Telephone Encounter (Signed)
Caryl Pina wanted to report and urgent INR for this pt. And would like to be advised on some things moving forward   INR-3.56

## 2014-04-28 DIAGNOSIS — N186 End stage renal disease: Secondary | ICD-10-CM | POA: Diagnosis not present

## 2014-04-28 DIAGNOSIS — Z992 Dependence on renal dialysis: Secondary | ICD-10-CM | POA: Diagnosis not present

## 2014-04-28 NOTE — Telephone Encounter (Signed)
See anticoag note

## 2014-04-29 ENCOUNTER — Ambulatory Visit (INDEPENDENT_AMBULATORY_CARE_PROVIDER_SITE_OTHER): Payer: Medicare Other | Admitting: Pharmacist Clinician (PhC)/ Clinical Pharmacy Specialist

## 2014-04-29 DIAGNOSIS — Z7901 Long term (current) use of anticoagulants: Secondary | ICD-10-CM | POA: Diagnosis not present

## 2014-04-29 DIAGNOSIS — D631 Anemia in chronic kidney disease: Secondary | ICD-10-CM | POA: Diagnosis not present

## 2014-04-29 DIAGNOSIS — N186 End stage renal disease: Secondary | ICD-10-CM | POA: Diagnosis not present

## 2014-04-29 DIAGNOSIS — I480 Paroxysmal atrial fibrillation: Secondary | ICD-10-CM

## 2014-04-29 LAB — POCT INR: INR: 1.4

## 2014-05-02 ENCOUNTER — Other Ambulatory Visit: Payer: Medicare Other

## 2014-05-06 ENCOUNTER — Ambulatory Visit (INDEPENDENT_AMBULATORY_CARE_PROVIDER_SITE_OTHER): Payer: Medicare Other | Admitting: Pharmacist Clinician (PhC)/ Clinical Pharmacy Specialist

## 2014-05-06 DIAGNOSIS — Z7901 Long term (current) use of anticoagulants: Secondary | ICD-10-CM

## 2014-05-06 DIAGNOSIS — I480 Paroxysmal atrial fibrillation: Secondary | ICD-10-CM

## 2014-05-06 LAB — POCT INR: INR: 1.2

## 2014-05-12 ENCOUNTER — Ambulatory Visit
Admission: RE | Admit: 2014-05-12 | Discharge: 2014-05-12 | Disposition: A | Discharge status: Home / Self Care | Payer: Medicare Other | Source: Ambulatory Visit | Attending: Physical Medicine and Rehabilitation | Admitting: Physical Medicine and Rehabilitation

## 2014-05-12 DIAGNOSIS — M961 Postlaminectomy syndrome, not elsewhere classified: Secondary | ICD-10-CM

## 2014-05-12 DIAGNOSIS — M47816 Spondylosis without myelopathy or radiculopathy, lumbar region: Secondary | ICD-10-CM | POA: Diagnosis not present

## 2014-05-13 ENCOUNTER — Ambulatory Visit (INDEPENDENT_AMBULATORY_CARE_PROVIDER_SITE_OTHER): Payer: Medicare Other | Admitting: Pharmacist Clinician (PhC)/ Clinical Pharmacy Specialist

## 2014-05-13 DIAGNOSIS — Z7901 Long term (current) use of anticoagulants: Secondary | ICD-10-CM

## 2014-05-13 DIAGNOSIS — I480 Paroxysmal atrial fibrillation: Secondary | ICD-10-CM

## 2014-05-13 LAB — POCT INR: INR: 1.8

## 2014-05-14 DIAGNOSIS — R1031 Right lower quadrant pain: Secondary | ICD-10-CM | POA: Diagnosis not present

## 2014-05-14 DIAGNOSIS — M5137 Other intervertebral disc degeneration, lumbosacral region: Secondary | ICD-10-CM | POA: Diagnosis not present

## 2014-05-14 DIAGNOSIS — M961 Postlaminectomy syndrome, not elsewhere classified: Secondary | ICD-10-CM | POA: Diagnosis not present

## 2014-05-14 DIAGNOSIS — G894 Chronic pain syndrome: Secondary | ICD-10-CM | POA: Diagnosis not present

## 2014-05-14 DIAGNOSIS — M5417 Radiculopathy, lumbosacral region: Secondary | ICD-10-CM | POA: Diagnosis not present

## 2014-05-14 DIAGNOSIS — M4727 Other spondylosis with radiculopathy, lumbosacral region: Secondary | ICD-10-CM | POA: Diagnosis not present

## 2014-05-14 DIAGNOSIS — M4806 Spinal stenosis, lumbar region: Secondary | ICD-10-CM | POA: Diagnosis not present

## 2014-05-14 DIAGNOSIS — M5127 Other intervertebral disc displacement, lumbosacral region: Secondary | ICD-10-CM | POA: Diagnosis not present

## 2014-05-16 ENCOUNTER — Telehealth: Payer: Self-pay | Admitting: Internal Medicine

## 2014-05-16 NOTE — Telephone Encounter (Signed)
Acknowledged receipt of fax in Dr. Lysbeth Penner inbasket. Daughter wanted to be advised of any recommendations or instructions.  Will forward to [place].

## 2014-05-16 NOTE — Telephone Encounter (Signed)
Routed to [name] to review note

## 2014-05-16 NOTE — Telephone Encounter (Signed)
Please call,daughter wants to know if Dr Ace Gins have talked to Dr Debara Pickett. Dr Ace Gins wants to do injection in his spine.

## 2014-05-17 DIAGNOSIS — N186 End stage renal disease: Secondary | ICD-10-CM | POA: Diagnosis not present

## 2014-05-17 NOTE — Telephone Encounter (Signed)
Left message for [name] (daughter) to call back.

## 2014-05-17 NOTE — Telephone Encounter (Signed)
Explained situation to daughter, communicated Dr. Lysbeth Penner advice, she voiced understanding.

## 2014-05-17 NOTE — Telephone Encounter (Signed)
He cannot come off of warfarin for injections right now - he is back in a-fib and we are trying to get him therapeutic for a cardioversion (please see my office note from January) - he will need a month of consecutive therapeutic INR's before we can do this, but he has been all over the board.  Alexander [name] is working with him.  Dr. Lemmie Evens

## 2014-05-20 ENCOUNTER — Ambulatory Visit (INDEPENDENT_AMBULATORY_CARE_PROVIDER_SITE_OTHER): Payer: Medicare Other | Admitting: Pharmacist Clinician (PhC)/ Clinical Pharmacy Specialist

## 2014-05-20 DIAGNOSIS — Z7901 Long term (current) use of anticoagulants: Secondary | ICD-10-CM

## 2014-05-20 DIAGNOSIS — I480 Paroxysmal atrial fibrillation: Secondary | ICD-10-CM | POA: Diagnosis not present

## 2014-05-20 LAB — POCT INR: INR: 2.2

## 2014-05-21 DIAGNOSIS — J329 Chronic sinusitis, unspecified: Secondary | ICD-10-CM | POA: Diagnosis not present

## 2014-05-22 DIAGNOSIS — I482 Chronic atrial fibrillation: Secondary | ICD-10-CM | POA: Diagnosis not present

## 2014-05-27 ENCOUNTER — Ambulatory Visit (INDEPENDENT_AMBULATORY_CARE_PROVIDER_SITE_OTHER): Payer: Medicare Other | Admitting: Pharmacist Clinician (PhC)/ Clinical Pharmacy Specialist

## 2014-05-27 DIAGNOSIS — I480 Paroxysmal atrial fibrillation: Secondary | ICD-10-CM | POA: Diagnosis not present

## 2014-05-27 DIAGNOSIS — Z992 Dependence on renal dialysis: Secondary | ICD-10-CM | POA: Diagnosis not present

## 2014-05-27 DIAGNOSIS — N186 End stage renal disease: Secondary | ICD-10-CM | POA: Diagnosis not present

## 2014-05-27 DIAGNOSIS — Z7901 Long term (current) use of anticoagulants: Secondary | ICD-10-CM | POA: Diagnosis not present

## 2014-05-27 LAB — POCT INR: INR: 2.1

## 2014-05-28 DIAGNOSIS — H4011X4 Primary open-angle glaucoma, indeterminate stage: Secondary | ICD-10-CM | POA: Diagnosis not present

## 2014-05-28 DIAGNOSIS — H40022 Open angle with borderline findings, high risk, left eye: Secondary | ICD-10-CM | POA: Diagnosis not present

## 2014-05-29 DIAGNOSIS — E1129 Type 2 diabetes mellitus with other diabetic kidney complication: Secondary | ICD-10-CM | POA: Diagnosis not present

## 2014-05-29 DIAGNOSIS — N186 End stage renal disease: Secondary | ICD-10-CM | POA: Diagnosis not present

## 2014-05-29 DIAGNOSIS — D631 Anemia in chronic kidney disease: Secondary | ICD-10-CM | POA: Diagnosis not present

## 2014-05-31 NOTE — Telephone Encounter (Signed)
Faxed notification to [facility], [place] [phone]) that patient CANNOT hold warfarin for injections

## 2014-06-03 ENCOUNTER — Ambulatory Visit (INDEPENDENT_AMBULATORY_CARE_PROVIDER_SITE_OTHER): Payer: Medicare Other | Admitting: Pharmacist Clinician (PhC)/ Clinical Pharmacy Specialist

## 2014-06-03 DIAGNOSIS — I480 Paroxysmal atrial fibrillation: Secondary | ICD-10-CM | POA: Diagnosis not present

## 2014-06-03 DIAGNOSIS — Z7901 Long term (current) use of anticoagulants: Secondary | ICD-10-CM | POA: Diagnosis not present

## 2014-06-03 LAB — POCT INR: INR: 1.8

## 2014-06-06 DIAGNOSIS — H548 Legal blindness, as defined in USA: Secondary | ICD-10-CM | POA: Diagnosis not present

## 2014-06-06 DIAGNOSIS — H542 Low vision, both eyes: Secondary | ICD-10-CM | POA: Diagnosis not present

## 2014-06-06 DIAGNOSIS — H40022 Open angle with borderline findings, high risk, left eye: Secondary | ICD-10-CM | POA: Diagnosis not present

## 2014-06-06 DIAGNOSIS — H3532 Exudative age-related macular degeneration: Secondary | ICD-10-CM | POA: Diagnosis not present

## 2014-06-06 NOTE — Telephone Encounter (Signed)
Called Dr. Anselmo Rod office - [facility] [place] and informed them that patient CANNOT hold warfarin for lumbar injections r/t cardiac condition. Explained that this info was faxed on 05/31/14. Informed them there is no time frame known at this time at which holding warfarin will be acceptable and this will have to be addressed at a later date.

## 2014-06-10 ENCOUNTER — Ambulatory Visit (INDEPENDENT_AMBULATORY_CARE_PROVIDER_SITE_OTHER): Payer: Medicare Other | Admitting: Pharmacist Clinician (PhC)/ Clinical Pharmacy Specialist

## 2014-06-10 DIAGNOSIS — I480 Paroxysmal atrial fibrillation: Secondary | ICD-10-CM

## 2014-06-10 DIAGNOSIS — Z7901 Long term (current) use of anticoagulants: Secondary | ICD-10-CM | POA: Diagnosis not present

## 2014-06-10 LAB — POCT INR: INR: 2.4

## 2014-06-17 ENCOUNTER — Ambulatory Visit (INDEPENDENT_AMBULATORY_CARE_PROVIDER_SITE_OTHER): Payer: Medicare Other | Admitting: Pharmacist Clinician (PhC)/ Clinical Pharmacy Specialist

## 2014-06-17 DIAGNOSIS — Z7901 Long term (current) use of anticoagulants: Secondary | ICD-10-CM | POA: Diagnosis not present

## 2014-06-17 DIAGNOSIS — I480 Paroxysmal atrial fibrillation: Secondary | ICD-10-CM | POA: Diagnosis not present

## 2014-06-17 LAB — POCT INR: INR: 2.8

## 2014-06-17 MED ORDER — WARFARIN SODIUM 2.5 MG PO TABS: ORAL_TABLET | ORAL | Status: DC

## 2014-06-19 DIAGNOSIS — I482 Chronic atrial fibrillation: Secondary | ICD-10-CM | POA: Diagnosis not present

## 2014-06-24 ENCOUNTER — Ambulatory Visit (INDEPENDENT_AMBULATORY_CARE_PROVIDER_SITE_OTHER): Payer: Medicare Other | Admitting: *Deleted

## 2014-06-24 ENCOUNTER — Encounter: Payer: Self-pay | Admitting: Cardiology

## 2014-06-24 ENCOUNTER — Other Ambulatory Visit: Payer: Self-pay | Admitting: *Deleted

## 2014-06-24 DIAGNOSIS — Z0189 Encounter for other specified special examinations: Secondary | ICD-10-CM

## 2014-06-24 DIAGNOSIS — Z7901 Long term (current) use of anticoagulants: Secondary | ICD-10-CM | POA: Diagnosis not present

## 2014-06-24 DIAGNOSIS — I480 Paroxysmal atrial fibrillation: Secondary | ICD-10-CM

## 2014-06-24 LAB — POCT INR: INR: 2.6

## 2014-06-27 DIAGNOSIS — E1129 Type 2 diabetes mellitus with other diabetic kidney complication: Secondary | ICD-10-CM | POA: Diagnosis not present

## 2014-06-27 DIAGNOSIS — N186 End stage renal disease: Secondary | ICD-10-CM | POA: Diagnosis not present

## 2014-06-27 DIAGNOSIS — Z992 Dependence on renal dialysis: Secondary | ICD-10-CM | POA: Diagnosis not present

## 2014-06-28 DIAGNOSIS — N186 End stage renal disease: Secondary | ICD-10-CM | POA: Diagnosis not present

## 2014-06-28 DIAGNOSIS — E1129 Type 2 diabetes mellitus with other diabetic kidney complication: Secondary | ICD-10-CM | POA: Diagnosis not present

## 2014-06-28 DIAGNOSIS — D631 Anemia in chronic kidney disease: Secondary | ICD-10-CM | POA: Diagnosis not present

## 2014-07-01 ENCOUNTER — Other Ambulatory Visit: Payer: Self-pay | Admitting: *Deleted

## 2014-07-01 DIAGNOSIS — I480 Paroxysmal atrial fibrillation: Secondary | ICD-10-CM

## 2014-07-01 DIAGNOSIS — N186 End stage renal disease: Secondary | ICD-10-CM | POA: Diagnosis not present

## 2014-07-01 DIAGNOSIS — E1129 Type 2 diabetes mellitus with other diabetic kidney complication: Secondary | ICD-10-CM | POA: Diagnosis not present

## 2014-07-01 DIAGNOSIS — D631 Anemia in chronic kidney disease: Secondary | ICD-10-CM | POA: Diagnosis not present

## 2014-07-01 MED ORDER — AMIODARONE HCL 200 MG PO TABS: 400.0000 mg | ORAL_TABLET | Freq: Every day | ORAL | Status: DC

## 2014-07-01 NOTE — Telephone Encounter (Signed)
Rx(s) sent to pharmacy electronically.  

## 2014-07-03 ENCOUNTER — Ambulatory Visit (INDEPENDENT_AMBULATORY_CARE_PROVIDER_SITE_OTHER): Payer: Medicare Other | Admitting: Pharmacist Clinician (PhC)/ Clinical Pharmacy Specialist

## 2014-07-03 DIAGNOSIS — I480 Paroxysmal atrial fibrillation: Secondary | ICD-10-CM | POA: Diagnosis not present

## 2014-07-03 DIAGNOSIS — E1129 Type 2 diabetes mellitus with other diabetic kidney complication: Secondary | ICD-10-CM | POA: Diagnosis not present

## 2014-07-03 DIAGNOSIS — Z7901 Long term (current) use of anticoagulants: Secondary | ICD-10-CM | POA: Diagnosis not present

## 2014-07-03 DIAGNOSIS — N186 End stage renal disease: Secondary | ICD-10-CM | POA: Diagnosis not present

## 2014-07-03 DIAGNOSIS — D631 Anemia in chronic kidney disease: Secondary | ICD-10-CM | POA: Diagnosis not present

## 2014-07-03 LAB — POCT INR: INR: 2.2

## 2014-07-04 ENCOUNTER — Telehealth: Payer: Self-pay | Admitting: *Deleted

## 2014-07-04 DIAGNOSIS — I480 Paroxysmal atrial fibrillation: Secondary | ICD-10-CM

## 2014-07-04 MED ORDER — AMIODARONE HCL 200 MG PO TABS: 400.0000 mg | ORAL_TABLET | Freq: Every day | ORAL | Status: DC

## 2014-07-04 NOTE — Telephone Encounter (Signed)
Encounter for paper refill

## 2014-07-05 DIAGNOSIS — N186 End stage renal disease: Secondary | ICD-10-CM | POA: Diagnosis not present

## 2014-07-05 DIAGNOSIS — D689 Coagulation defect, unspecified: Secondary | ICD-10-CM | POA: Diagnosis not present

## 2014-07-05 DIAGNOSIS — D631 Anemia in chronic kidney disease: Secondary | ICD-10-CM | POA: Diagnosis not present

## 2014-07-05 DIAGNOSIS — Z01812 Encounter for preprocedural laboratory examination: Secondary | ICD-10-CM | POA: Diagnosis not present

## 2014-07-05 DIAGNOSIS — E1129 Type 2 diabetes mellitus with other diabetic kidney complication: Secondary | ICD-10-CM | POA: Diagnosis not present

## 2014-07-05 DIAGNOSIS — R5383 Other fatigue: Secondary | ICD-10-CM | POA: Diagnosis not present

## 2014-07-06 LAB — CBC
HCT: 35.6 % — ABNORMAL LOW (ref 39.0–52.0)
HEMOGLOBIN: 11.6 g/dL — AB (ref 13.0–17.0)
MCH: 34 pg (ref 26.0–34.0)
MCHC: 32.6 g/dL (ref 30.0–36.0)
MCV: 104.4 fL — ABNORMAL HIGH (ref 78.0–100.0)
MPV: 10.6 fL (ref 8.6–12.4)
Platelets: 255 10*3/uL (ref 150–400)
RBC: 3.41 MIL/uL — ABNORMAL LOW (ref 4.22–5.81)
RDW: 17.8 % — ABNORMAL HIGH (ref 11.5–15.5)
WBC: 6.6 10*3/uL (ref 4.0–10.5)

## 2014-07-06 LAB — BASIC METABOLIC PANEL
BUN: 12 mg/dL (ref 6–23)
CO2: 33 mEq/L — ABNORMAL HIGH (ref 19–32)
Calcium: 8.5 mg/dL (ref 8.4–10.5)
Chloride: 99 mEq/L (ref 96–112)
Creat: 3.29 mg/dL — ABNORMAL HIGH (ref 0.50–1.35)
Glucose, Bld: 93 mg/dL (ref 70–99)
Potassium: 3.9 mEq/L (ref 3.5–5.3)
Sodium: 139 mEq/L (ref 135–145)

## 2014-07-06 LAB — PROTIME-INR
INR: 2.05 — AB (ref <1.50)
PROTHROMBIN TIME: 23.1 s — AB (ref 11.6–15.2)

## 2014-07-06 LAB — TSH: TSH: 2.069 u[IU]/mL (ref 0.350–4.500)

## 2014-07-06 LAB — APTT: APTT: 39 s — AB (ref 24–37)

## 2014-07-08 DIAGNOSIS — N186 End stage renal disease: Secondary | ICD-10-CM | POA: Diagnosis not present

## 2014-07-08 DIAGNOSIS — D631 Anemia in chronic kidney disease: Secondary | ICD-10-CM | POA: Diagnosis not present

## 2014-07-08 DIAGNOSIS — E1129 Type 2 diabetes mellitus with other diabetic kidney complication: Secondary | ICD-10-CM | POA: Diagnosis not present

## 2014-07-09 DIAGNOSIS — H3323 Serous retinal detachment, bilateral: Secondary | ICD-10-CM | POA: Diagnosis not present

## 2014-07-09 DIAGNOSIS — H40022 Open angle with borderline findings, high risk, left eye: Secondary | ICD-10-CM | POA: Diagnosis not present

## 2014-07-09 DIAGNOSIS — H548 Legal blindness, as defined in USA: Secondary | ICD-10-CM | POA: Diagnosis not present

## 2014-07-09 DIAGNOSIS — H3532 Exudative age-related macular degeneration: Secondary | ICD-10-CM | POA: Diagnosis not present

## 2014-07-10 DIAGNOSIS — N186 End stage renal disease: Secondary | ICD-10-CM | POA: Diagnosis not present

## 2014-07-10 DIAGNOSIS — E1129 Type 2 diabetes mellitus with other diabetic kidney complication: Secondary | ICD-10-CM | POA: Diagnosis not present

## 2014-07-10 DIAGNOSIS — D631 Anemia in chronic kidney disease: Secondary | ICD-10-CM | POA: Diagnosis not present

## 2014-07-10 MED ORDER — SODIUM CHLORIDE 0.9 % IV SOLN: INTRAVENOUS | Status: DC

## 2014-07-11 ENCOUNTER — Encounter (HOSPITAL_COMMUNITY)
Admission: RE | Disposition: A | Discharge status: Home / Self Care | Payer: Self-pay | Source: Ambulatory Visit | Attending: Cardiology

## 2014-07-11 ENCOUNTER — Ambulatory Visit (HOSPITAL_COMMUNITY)
Admission: RE | Admit: 2014-07-11 | Discharge: 2014-07-11 | Disposition: A | Discharge status: Home / Self Care | Payer: Medicare Other | Source: Ambulatory Visit | Attending: Cardiology | Admitting: Cardiology

## 2014-07-11 ENCOUNTER — Telehealth: Payer: Self-pay | Admitting: Internal Medicine

## 2014-07-11 ENCOUNTER — Encounter (HOSPITAL_COMMUNITY): Payer: Self-pay | Admitting: *Deleted

## 2014-07-11 DIAGNOSIS — Z0189 Encounter for other specified special examinations: Secondary | ICD-10-CM

## 2014-07-11 DIAGNOSIS — Z539 Procedure and treatment not carried out, unspecified reason: Secondary | ICD-10-CM | POA: Insufficient documentation

## 2014-07-11 SURGERY — CANCELLED PROCEDURE

## 2014-07-11 NOTE — Telephone Encounter (Signed)
Returned call to patient's wife Dr.Hilty out of office.Spoke to DOD Dr.Croitoru he advised patient will be on coumadin long term.He advised ok to hold coumadin 5 to 7 days prior to steroid injection.Advised to restart coumadin as soon as injecting Dr.says ok to restart.

## 2014-07-11 NOTE — Telephone Encounter (Signed)
Pt's wife called in wanting to know that since his heart is back in rhythm , how long will he need to be on coumadin. She asks because he is having some pain in his back and will need a shot but he will have to be off of coumadin in order to receive the shot. Please f/u with his wife  Thanks

## 2014-07-11 NOTE — Telephone Encounter (Signed)
Returned call to patient's wife.She stated she wanted to ask Dr.Hilty how long will patient be on coumadin.Also he needs steroid injections in his back,will it be ok to hold coumadin before steroid injections.Message sent to Dr.Hilty for advice.

## 2014-07-12 DIAGNOSIS — E1129 Type 2 diabetes mellitus with other diabetic kidney complication: Secondary | ICD-10-CM | POA: Diagnosis not present

## 2014-07-12 DIAGNOSIS — N186 End stage renal disease: Secondary | ICD-10-CM | POA: Diagnosis not present

## 2014-07-12 DIAGNOSIS — D631 Anemia in chronic kidney disease: Secondary | ICD-10-CM | POA: Diagnosis not present

## 2014-07-15 DIAGNOSIS — D631 Anemia in chronic kidney disease: Secondary | ICD-10-CM | POA: Diagnosis not present

## 2014-07-15 DIAGNOSIS — E1129 Type 2 diabetes mellitus with other diabetic kidney complication: Secondary | ICD-10-CM | POA: Diagnosis not present

## 2014-07-15 DIAGNOSIS — N186 End stage renal disease: Secondary | ICD-10-CM | POA: Diagnosis not present

## 2014-07-17 DIAGNOSIS — E1129 Type 2 diabetes mellitus with other diabetic kidney complication: Secondary | ICD-10-CM | POA: Diagnosis not present

## 2014-07-17 DIAGNOSIS — I482 Chronic atrial fibrillation: Secondary | ICD-10-CM | POA: Diagnosis not present

## 2014-07-17 DIAGNOSIS — D631 Anemia in chronic kidney disease: Secondary | ICD-10-CM | POA: Diagnosis not present

## 2014-07-17 DIAGNOSIS — N186 End stage renal disease: Secondary | ICD-10-CM | POA: Diagnosis not present

## 2014-07-17 LAB — PROTIME-INR

## 2014-07-18 DIAGNOSIS — M5417 Radiculopathy, lumbosacral region: Secondary | ICD-10-CM | POA: Diagnosis not present

## 2014-07-18 DIAGNOSIS — M5127 Other intervertebral disc displacement, lumbosacral region: Secondary | ICD-10-CM | POA: Diagnosis not present

## 2014-07-18 DIAGNOSIS — M5137 Other intervertebral disc degeneration, lumbosacral region: Secondary | ICD-10-CM | POA: Diagnosis not present

## 2014-07-18 DIAGNOSIS — M4806 Spinal stenosis, lumbar region: Secondary | ICD-10-CM | POA: Diagnosis not present

## 2014-07-18 DIAGNOSIS — M961 Postlaminectomy syndrome, not elsewhere classified: Secondary | ICD-10-CM | POA: Diagnosis not present

## 2014-07-18 DIAGNOSIS — M4727 Other spondylosis with radiculopathy, lumbosacral region: Secondary | ICD-10-CM | POA: Diagnosis not present

## 2014-07-19 ENCOUNTER — Ambulatory Visit (INDEPENDENT_AMBULATORY_CARE_PROVIDER_SITE_OTHER): Payer: Medicare Other | Admitting: Pharmacist Clinician (PhC)/ Clinical Pharmacy Specialist

## 2014-07-19 DIAGNOSIS — N186 End stage renal disease: Secondary | ICD-10-CM | POA: Diagnosis not present

## 2014-07-19 DIAGNOSIS — Z7901 Long term (current) use of anticoagulants: Secondary | ICD-10-CM | POA: Diagnosis not present

## 2014-07-19 DIAGNOSIS — I480 Paroxysmal atrial fibrillation: Secondary | ICD-10-CM | POA: Diagnosis not present

## 2014-07-19 DIAGNOSIS — E1129 Type 2 diabetes mellitus with other diabetic kidney complication: Secondary | ICD-10-CM | POA: Diagnosis not present

## 2014-07-19 DIAGNOSIS — D631 Anemia in chronic kidney disease: Secondary | ICD-10-CM | POA: Diagnosis not present

## 2014-07-22 DIAGNOSIS — E1129 Type 2 diabetes mellitus with other diabetic kidney complication: Secondary | ICD-10-CM | POA: Diagnosis not present

## 2014-07-22 DIAGNOSIS — D631 Anemia in chronic kidney disease: Secondary | ICD-10-CM | POA: Diagnosis not present

## 2014-07-22 DIAGNOSIS — N186 End stage renal disease: Secondary | ICD-10-CM | POA: Diagnosis not present

## 2014-07-23 ENCOUNTER — Encounter: Payer: Self-pay | Admitting: Internal Medicine

## 2014-07-24 DIAGNOSIS — N186 End stage renal disease: Secondary | ICD-10-CM | POA: Diagnosis not present

## 2014-07-24 DIAGNOSIS — E1129 Type 2 diabetes mellitus with other diabetic kidney complication: Secondary | ICD-10-CM | POA: Diagnosis not present

## 2014-07-24 DIAGNOSIS — D631 Anemia in chronic kidney disease: Secondary | ICD-10-CM | POA: Diagnosis not present

## 2014-07-26 DIAGNOSIS — D631 Anemia in chronic kidney disease: Secondary | ICD-10-CM | POA: Diagnosis not present

## 2014-07-26 DIAGNOSIS — E1129 Type 2 diabetes mellitus with other diabetic kidney complication: Secondary | ICD-10-CM | POA: Diagnosis not present

## 2014-07-26 DIAGNOSIS — N186 End stage renal disease: Secondary | ICD-10-CM | POA: Diagnosis not present

## 2014-07-27 DIAGNOSIS — N186 End stage renal disease: Secondary | ICD-10-CM | POA: Diagnosis not present

## 2014-07-27 DIAGNOSIS — Z992 Dependence on renal dialysis: Secondary | ICD-10-CM | POA: Diagnosis not present

## 2014-07-27 DIAGNOSIS — E1129 Type 2 diabetes mellitus with other diabetic kidney complication: Secondary | ICD-10-CM | POA: Diagnosis not present

## 2014-07-29 ENCOUNTER — Ambulatory Visit (INDEPENDENT_AMBULATORY_CARE_PROVIDER_SITE_OTHER): Payer: Medicare Other | Admitting: Pharmacist Clinician (PhC)/ Clinical Pharmacy Specialist

## 2014-07-29 DIAGNOSIS — N186 End stage renal disease: Secondary | ICD-10-CM | POA: Diagnosis not present

## 2014-07-29 DIAGNOSIS — E8779 Other fluid overload: Secondary | ICD-10-CM | POA: Diagnosis not present

## 2014-07-29 DIAGNOSIS — E1129 Type 2 diabetes mellitus with other diabetic kidney complication: Secondary | ICD-10-CM | POA: Diagnosis not present

## 2014-07-29 DIAGNOSIS — D631 Anemia in chronic kidney disease: Secondary | ICD-10-CM | POA: Diagnosis not present

## 2014-07-29 DIAGNOSIS — I480 Paroxysmal atrial fibrillation: Secondary | ICD-10-CM | POA: Diagnosis not present

## 2014-07-29 DIAGNOSIS — Z7901 Long term (current) use of anticoagulants: Secondary | ICD-10-CM | POA: Diagnosis not present

## 2014-07-29 LAB — POCT INR: INR: 2.1

## 2014-08-01 ENCOUNTER — Telehealth: Payer: Self-pay | Admitting: *Deleted

## 2014-08-01 NOTE — Telephone Encounter (Signed)
Faxed clearance for patient to HOLD coumadin 6 days prior to lumbar injection to [facility] [place] - Dr. Neomia Dear

## 2014-08-06 DIAGNOSIS — D352 Benign neoplasm of pituitary gland: Secondary | ICD-10-CM | POA: Diagnosis not present

## 2014-08-13 DIAGNOSIS — N189 Chronic kidney disease, unspecified: Secondary | ICD-10-CM | POA: Diagnosis not present

## 2014-08-13 DIAGNOSIS — E119 Type 2 diabetes mellitus without complications: Secondary | ICD-10-CM | POA: Diagnosis not present

## 2014-08-13 DIAGNOSIS — E789 Disorder of lipoprotein metabolism, unspecified: Secondary | ICD-10-CM | POA: Diagnosis not present

## 2014-08-13 DIAGNOSIS — E221 Hyperprolactinemia: Secondary | ICD-10-CM | POA: Diagnosis not present

## 2014-08-15 DIAGNOSIS — M4806 Spinal stenosis, lumbar region: Secondary | ICD-10-CM | POA: Diagnosis not present

## 2014-08-15 DIAGNOSIS — M5137 Other intervertebral disc degeneration, lumbosacral region: Secondary | ICD-10-CM | POA: Diagnosis not present

## 2014-08-15 DIAGNOSIS — M5127 Other intervertebral disc displacement, lumbosacral region: Secondary | ICD-10-CM | POA: Diagnosis not present

## 2014-08-15 DIAGNOSIS — M5417 Radiculopathy, lumbosacral region: Secondary | ICD-10-CM | POA: Diagnosis not present

## 2014-08-15 DIAGNOSIS — M961 Postlaminectomy syndrome, not elsewhere classified: Secondary | ICD-10-CM | POA: Diagnosis not present

## 2014-08-15 DIAGNOSIS — G894 Chronic pain syndrome: Secondary | ICD-10-CM | POA: Diagnosis not present

## 2014-08-15 DIAGNOSIS — M545 Low back pain: Secondary | ICD-10-CM | POA: Diagnosis not present

## 2014-08-15 DIAGNOSIS — M4727 Other spondylosis with radiculopathy, lumbosacral region: Secondary | ICD-10-CM | POA: Diagnosis not present

## 2014-08-21 ENCOUNTER — Encounter: Payer: Self-pay | Admitting: Internal Medicine

## 2014-08-21 DIAGNOSIS — I482 Chronic atrial fibrillation: Secondary | ICD-10-CM | POA: Diagnosis not present

## 2014-08-22 LAB — PROTIME-INR: INR: 1.2 — AB (ref 0.9–1.1)

## 2014-08-23 ENCOUNTER — Telehealth: Payer: Self-pay

## 2014-08-23 NOTE — Telephone Encounter (Signed)
Called spoke with pt and his wife, INR faxed from [facility] 1.18, monthly blood draw, pt had held Coumadin x 6 days prior to injection.  Resumed previous dosage of Coumadin on 08/16/14.  Advised to have pt take 7.5mg  today, and take 5mg  tomorrow, then resume previous dosage 3.75mg  QD except 5mg  MF.  Pt has recheck ordered at [facility] for 08/28/14.  Will await result.

## 2014-08-27 DIAGNOSIS — N186 End stage renal disease: Secondary | ICD-10-CM | POA: Diagnosis not present

## 2014-08-27 DIAGNOSIS — E1129 Type 2 diabetes mellitus with other diabetic kidney complication: Secondary | ICD-10-CM | POA: Diagnosis not present

## 2014-08-27 DIAGNOSIS — Z992 Dependence on renal dialysis: Secondary | ICD-10-CM | POA: Diagnosis not present

## 2014-08-28 ENCOUNTER — Ambulatory Visit (INDEPENDENT_AMBULATORY_CARE_PROVIDER_SITE_OTHER): Payer: Medicare Other | Admitting: Pharmacist Clinician (PhC)/ Clinical Pharmacy Specialist

## 2014-08-28 DIAGNOSIS — D631 Anemia in chronic kidney disease: Secondary | ICD-10-CM | POA: Diagnosis not present

## 2014-08-28 DIAGNOSIS — N2581 Secondary hyperparathyroidism of renal origin: Secondary | ICD-10-CM | POA: Diagnosis not present

## 2014-08-28 DIAGNOSIS — Z7901 Long term (current) use of anticoagulants: Secondary | ICD-10-CM | POA: Diagnosis not present

## 2014-08-28 DIAGNOSIS — N186 End stage renal disease: Secondary | ICD-10-CM | POA: Diagnosis not present

## 2014-08-28 DIAGNOSIS — E1129 Type 2 diabetes mellitus with other diabetic kidney complication: Secondary | ICD-10-CM | POA: Diagnosis not present

## 2014-08-28 DIAGNOSIS — I480 Paroxysmal atrial fibrillation: Secondary | ICD-10-CM | POA: Diagnosis not present

## 2014-08-28 LAB — POCT INR: INR: 3.1

## 2014-08-30 DIAGNOSIS — N186 End stage renal disease: Secondary | ICD-10-CM | POA: Diagnosis not present

## 2014-08-30 DIAGNOSIS — D631 Anemia in chronic kidney disease: Secondary | ICD-10-CM | POA: Diagnosis not present

## 2014-08-30 DIAGNOSIS — N2581 Secondary hyperparathyroidism of renal origin: Secondary | ICD-10-CM | POA: Diagnosis not present

## 2014-08-30 DIAGNOSIS — E1129 Type 2 diabetes mellitus with other diabetic kidney complication: Secondary | ICD-10-CM | POA: Diagnosis not present

## 2014-09-02 DIAGNOSIS — D631 Anemia in chronic kidney disease: Secondary | ICD-10-CM | POA: Diagnosis not present

## 2014-09-02 DIAGNOSIS — N186 End stage renal disease: Secondary | ICD-10-CM | POA: Diagnosis not present

## 2014-09-02 DIAGNOSIS — E1129 Type 2 diabetes mellitus with other diabetic kidney complication: Secondary | ICD-10-CM | POA: Diagnosis not present

## 2014-09-02 DIAGNOSIS — N2581 Secondary hyperparathyroidism of renal origin: Secondary | ICD-10-CM | POA: Diagnosis not present

## 2014-09-04 DIAGNOSIS — D631 Anemia in chronic kidney disease: Secondary | ICD-10-CM | POA: Diagnosis not present

## 2014-09-04 DIAGNOSIS — N186 End stage renal disease: Secondary | ICD-10-CM | POA: Diagnosis not present

## 2014-09-04 DIAGNOSIS — N2581 Secondary hyperparathyroidism of renal origin: Secondary | ICD-10-CM | POA: Diagnosis not present

## 2014-09-04 DIAGNOSIS — E1129 Type 2 diabetes mellitus with other diabetic kidney complication: Secondary | ICD-10-CM | POA: Diagnosis not present

## 2014-09-06 DIAGNOSIS — N2581 Secondary hyperparathyroidism of renal origin: Secondary | ICD-10-CM | POA: Diagnosis not present

## 2014-09-06 DIAGNOSIS — N186 End stage renal disease: Secondary | ICD-10-CM | POA: Diagnosis not present

## 2014-09-06 DIAGNOSIS — D631 Anemia in chronic kidney disease: Secondary | ICD-10-CM | POA: Diagnosis not present

## 2014-09-06 DIAGNOSIS — E1129 Type 2 diabetes mellitus with other diabetic kidney complication: Secondary | ICD-10-CM | POA: Diagnosis not present

## 2014-09-09 DIAGNOSIS — E1129 Type 2 diabetes mellitus with other diabetic kidney complication: Secondary | ICD-10-CM | POA: Diagnosis not present

## 2014-09-09 DIAGNOSIS — N2581 Secondary hyperparathyroidism of renal origin: Secondary | ICD-10-CM | POA: Diagnosis not present

## 2014-09-09 DIAGNOSIS — D631 Anemia in chronic kidney disease: Secondary | ICD-10-CM | POA: Diagnosis not present

## 2014-09-09 DIAGNOSIS — N186 End stage renal disease: Secondary | ICD-10-CM | POA: Diagnosis not present

## 2014-09-11 ENCOUNTER — Ambulatory Visit (INDEPENDENT_AMBULATORY_CARE_PROVIDER_SITE_OTHER): Payer: Medicare Other | Admitting: Pharmacist Clinician (PhC)/ Clinical Pharmacy Specialist

## 2014-09-11 DIAGNOSIS — I480 Paroxysmal atrial fibrillation: Secondary | ICD-10-CM | POA: Diagnosis not present

## 2014-09-11 DIAGNOSIS — Z7901 Long term (current) use of anticoagulants: Secondary | ICD-10-CM | POA: Diagnosis not present

## 2014-09-11 DIAGNOSIS — N2581 Secondary hyperparathyroidism of renal origin: Secondary | ICD-10-CM | POA: Diagnosis not present

## 2014-09-11 DIAGNOSIS — N186 End stage renal disease: Secondary | ICD-10-CM | POA: Diagnosis not present

## 2014-09-11 DIAGNOSIS — D631 Anemia in chronic kidney disease: Secondary | ICD-10-CM | POA: Diagnosis not present

## 2014-09-11 DIAGNOSIS — E1129 Type 2 diabetes mellitus with other diabetic kidney complication: Secondary | ICD-10-CM | POA: Diagnosis not present

## 2014-09-11 LAB — POCT INR: INR: 1

## 2014-09-12 DIAGNOSIS — G894 Chronic pain syndrome: Secondary | ICD-10-CM | POA: Diagnosis not present

## 2014-09-12 DIAGNOSIS — M5417 Radiculopathy, lumbosacral region: Secondary | ICD-10-CM | POA: Diagnosis not present

## 2014-09-12 DIAGNOSIS — M961 Postlaminectomy syndrome, not elsewhere classified: Secondary | ICD-10-CM | POA: Diagnosis not present

## 2014-09-12 DIAGNOSIS — M4727 Other spondylosis with radiculopathy, lumbosacral region: Secondary | ICD-10-CM | POA: Diagnosis not present

## 2014-09-12 DIAGNOSIS — M4806 Spinal stenosis, lumbar region: Secondary | ICD-10-CM | POA: Diagnosis not present

## 2014-09-12 DIAGNOSIS — M5137 Other intervertebral disc degeneration, lumbosacral region: Secondary | ICD-10-CM | POA: Diagnosis not present

## 2014-09-12 DIAGNOSIS — M5127 Other intervertebral disc displacement, lumbosacral region: Secondary | ICD-10-CM | POA: Diagnosis not present

## 2014-09-13 DIAGNOSIS — N186 End stage renal disease: Secondary | ICD-10-CM | POA: Diagnosis not present

## 2014-09-13 DIAGNOSIS — D631 Anemia in chronic kidney disease: Secondary | ICD-10-CM | POA: Diagnosis not present

## 2014-09-13 DIAGNOSIS — N2581 Secondary hyperparathyroidism of renal origin: Secondary | ICD-10-CM | POA: Diagnosis not present

## 2014-09-13 DIAGNOSIS — E1129 Type 2 diabetes mellitus with other diabetic kidney complication: Secondary | ICD-10-CM | POA: Diagnosis not present

## 2014-09-16 DIAGNOSIS — D631 Anemia in chronic kidney disease: Secondary | ICD-10-CM | POA: Diagnosis not present

## 2014-09-16 DIAGNOSIS — N186 End stage renal disease: Secondary | ICD-10-CM | POA: Diagnosis not present

## 2014-09-16 DIAGNOSIS — N2581 Secondary hyperparathyroidism of renal origin: Secondary | ICD-10-CM | POA: Diagnosis not present

## 2014-09-16 DIAGNOSIS — E1129 Type 2 diabetes mellitus with other diabetic kidney complication: Secondary | ICD-10-CM | POA: Diagnosis not present

## 2014-09-18 DIAGNOSIS — D631 Anemia in chronic kidney disease: Secondary | ICD-10-CM | POA: Diagnosis not present

## 2014-09-18 DIAGNOSIS — I482 Chronic atrial fibrillation: Secondary | ICD-10-CM | POA: Diagnosis not present

## 2014-09-18 DIAGNOSIS — N2581 Secondary hyperparathyroidism of renal origin: Secondary | ICD-10-CM | POA: Diagnosis not present

## 2014-09-18 DIAGNOSIS — N186 End stage renal disease: Secondary | ICD-10-CM | POA: Diagnosis not present

## 2014-09-18 DIAGNOSIS — E1129 Type 2 diabetes mellitus with other diabetic kidney complication: Secondary | ICD-10-CM | POA: Diagnosis not present

## 2014-09-20 DIAGNOSIS — E1129 Type 2 diabetes mellitus with other diabetic kidney complication: Secondary | ICD-10-CM | POA: Diagnosis not present

## 2014-09-20 DIAGNOSIS — N186 End stage renal disease: Secondary | ICD-10-CM | POA: Diagnosis not present

## 2014-09-20 DIAGNOSIS — N2581 Secondary hyperparathyroidism of renal origin: Secondary | ICD-10-CM | POA: Diagnosis not present

## 2014-09-20 DIAGNOSIS — D631 Anemia in chronic kidney disease: Secondary | ICD-10-CM | POA: Diagnosis not present

## 2014-09-23 DIAGNOSIS — N2581 Secondary hyperparathyroidism of renal origin: Secondary | ICD-10-CM | POA: Diagnosis not present

## 2014-09-23 DIAGNOSIS — N186 End stage renal disease: Secondary | ICD-10-CM | POA: Diagnosis not present

## 2014-09-25 DIAGNOSIS — N2581 Secondary hyperparathyroidism of renal origin: Secondary | ICD-10-CM | POA: Diagnosis not present

## 2014-09-25 DIAGNOSIS — N186 End stage renal disease: Secondary | ICD-10-CM | POA: Diagnosis not present

## 2014-09-26 DIAGNOSIS — N186 End stage renal disease: Secondary | ICD-10-CM | POA: Diagnosis not present

## 2014-09-26 DIAGNOSIS — Z992 Dependence on renal dialysis: Secondary | ICD-10-CM | POA: Diagnosis not present

## 2014-09-26 DIAGNOSIS — E1129 Type 2 diabetes mellitus with other diabetic kidney complication: Secondary | ICD-10-CM | POA: Diagnosis not present

## 2014-09-27 DIAGNOSIS — N186 End stage renal disease: Secondary | ICD-10-CM | POA: Diagnosis not present

## 2014-09-27 DIAGNOSIS — N2581 Secondary hyperparathyroidism of renal origin: Secondary | ICD-10-CM | POA: Diagnosis not present

## 2014-09-30 DIAGNOSIS — N2581 Secondary hyperparathyroidism of renal origin: Secondary | ICD-10-CM | POA: Diagnosis not present

## 2014-09-30 DIAGNOSIS — E1129 Type 2 diabetes mellitus with other diabetic kidney complication: Secondary | ICD-10-CM | POA: Diagnosis not present

## 2014-09-30 DIAGNOSIS — N186 End stage renal disease: Secondary | ICD-10-CM | POA: Diagnosis not present

## 2014-10-01 DIAGNOSIS — Z961 Presence of intraocular lens: Secondary | ICD-10-CM | POA: Diagnosis not present

## 2014-10-01 DIAGNOSIS — H40022 Open angle with borderline findings, high risk, left eye: Secondary | ICD-10-CM | POA: Diagnosis not present

## 2014-10-01 DIAGNOSIS — H4011X4 Primary open-angle glaucoma, indeterminate stage: Secondary | ICD-10-CM | POA: Diagnosis not present

## 2014-10-02 ENCOUNTER — Ambulatory Visit (INDEPENDENT_AMBULATORY_CARE_PROVIDER_SITE_OTHER): Payer: Medicare Other | Admitting: Pharmacist Clinician (PhC)/ Clinical Pharmacy Specialist

## 2014-10-02 DIAGNOSIS — I480 Paroxysmal atrial fibrillation: Secondary | ICD-10-CM

## 2014-10-02 DIAGNOSIS — N186 End stage renal disease: Secondary | ICD-10-CM | POA: Diagnosis not present

## 2014-10-02 DIAGNOSIS — E1129 Type 2 diabetes mellitus with other diabetic kidney complication: Secondary | ICD-10-CM | POA: Diagnosis not present

## 2014-10-02 DIAGNOSIS — N2581 Secondary hyperparathyroidism of renal origin: Secondary | ICD-10-CM | POA: Diagnosis not present

## 2014-10-02 DIAGNOSIS — Z7901 Long term (current) use of anticoagulants: Secondary | ICD-10-CM | POA: Diagnosis not present

## 2014-10-02 LAB — POCT INR: INR: 2.7

## 2014-10-03 ENCOUNTER — Telehealth: Payer: Self-pay | Admitting: Pharmacist Clinician (PhC)/ Clinical Pharmacy Specialist

## 2014-10-03 DIAGNOSIS — M47817 Spondylosis without myelopathy or radiculopathy, lumbosacral region: Secondary | ICD-10-CM | POA: Diagnosis not present

## 2014-10-03 DIAGNOSIS — G894 Chronic pain syndrome: Secondary | ICD-10-CM | POA: Diagnosis not present

## 2014-10-03 DIAGNOSIS — M545 Low back pain: Secondary | ICD-10-CM | POA: Diagnosis not present

## 2014-10-03 DIAGNOSIS — M961 Postlaminectomy syndrome, not elsewhere classified: Secondary | ICD-10-CM | POA: Diagnosis not present

## 2014-10-03 NOTE — Telephone Encounter (Signed)
[facility] asking to speak to me about mutual patient.    Tried to return call at 4pm, office closed, will be closed Friday.  Will return call on Monday

## 2014-10-04 DIAGNOSIS — E1129 Type 2 diabetes mellitus with other diabetic kidney complication: Secondary | ICD-10-CM | POA: Diagnosis not present

## 2014-10-04 DIAGNOSIS — N186 End stage renal disease: Secondary | ICD-10-CM | POA: Diagnosis not present

## 2014-10-04 DIAGNOSIS — N2581 Secondary hyperparathyroidism of renal origin: Secondary | ICD-10-CM | POA: Diagnosis not present

## 2014-10-04 MED ORDER — ENOXAPARIN SODIUM 30 MG/0.3ML ~~LOC~~ SOLN: 30.0000 mg | SUBCUTANEOUS | Status: DC

## 2014-10-04 NOTE — Telephone Encounter (Signed)
Spoke with spouse.  Because of previous TIA, will want to bridge with lovenox.  Pt to have procedure on Thursday July 14, per Dr. Ace Gins, off warfarin x 7 days.  Cannot get patient into office today for lovenox teaching.  Will call prescription to pharmacy, wife will pick up this weekend.  Patient and spouse have no knowledge of injection technique.  Will have patient come to office after dialysis on Monday for first injection and training.    Reviewed with [name] for dose, will give patient 30 mg q24h.  Rx sent to pharmacy.

## 2014-10-07 ENCOUNTER — Telehealth: Payer: Self-pay | Admitting: Pharmacist Clinician (PhC)/ Clinical Pharmacy Specialist

## 2014-10-07 DIAGNOSIS — N2581 Secondary hyperparathyroidism of renal origin: Secondary | ICD-10-CM | POA: Diagnosis not present

## 2014-10-07 DIAGNOSIS — E1129 Type 2 diabetes mellitus with other diabetic kidney complication: Secondary | ICD-10-CM | POA: Diagnosis not present

## 2014-10-07 DIAGNOSIS — N186 End stage renal disease: Secondary | ICD-10-CM | POA: Diagnosis not present

## 2014-10-07 NOTE — Telephone Encounter (Signed)
Pt and wife stopped in office for training of lovenox injections.  Per wife, rx is $500 for 10 syringes, they did not pick up.  Dose is 30 mg q24h x 2 days prior to injection (Monday and Tuesday - injection is Thursday am), then again q24h x 5 days after.  Pt was able to get some 80 mg syringes, his dose is 30 mg daily.  Showed wife how to remove medication from syringe to get to 30 mg dose.  She pushed out first syringe to correct dose and injection was given in the office.

## 2014-10-09 DIAGNOSIS — E1129 Type 2 diabetes mellitus with other diabetic kidney complication: Secondary | ICD-10-CM | POA: Diagnosis not present

## 2014-10-09 DIAGNOSIS — N186 End stage renal disease: Secondary | ICD-10-CM | POA: Diagnosis not present

## 2014-10-09 DIAGNOSIS — N2581 Secondary hyperparathyroidism of renal origin: Secondary | ICD-10-CM | POA: Diagnosis not present

## 2014-10-10 DIAGNOSIS — M47817 Spondylosis without myelopathy or radiculopathy, lumbosacral region: Secondary | ICD-10-CM | POA: Diagnosis not present

## 2014-10-10 DIAGNOSIS — G894 Chronic pain syndrome: Secondary | ICD-10-CM | POA: Diagnosis not present

## 2014-10-10 DIAGNOSIS — M545 Low back pain: Secondary | ICD-10-CM | POA: Diagnosis not present

## 2014-10-10 DIAGNOSIS — R1031 Right lower quadrant pain: Secondary | ICD-10-CM | POA: Diagnosis not present

## 2014-10-10 DIAGNOSIS — M961 Postlaminectomy syndrome, not elsewhere classified: Secondary | ICD-10-CM | POA: Diagnosis not present

## 2014-10-11 DIAGNOSIS — N2581 Secondary hyperparathyroidism of renal origin: Secondary | ICD-10-CM | POA: Diagnosis not present

## 2014-10-11 DIAGNOSIS — N186 End stage renal disease: Secondary | ICD-10-CM | POA: Diagnosis not present

## 2014-10-11 DIAGNOSIS — E1129 Type 2 diabetes mellitus with other diabetic kidney complication: Secondary | ICD-10-CM | POA: Diagnosis not present

## 2014-10-14 DIAGNOSIS — N186 End stage renal disease: Secondary | ICD-10-CM | POA: Diagnosis not present

## 2014-10-14 DIAGNOSIS — E1129 Type 2 diabetes mellitus with other diabetic kidney complication: Secondary | ICD-10-CM | POA: Diagnosis not present

## 2014-10-14 DIAGNOSIS — N2581 Secondary hyperparathyroidism of renal origin: Secondary | ICD-10-CM | POA: Diagnosis not present

## 2014-10-15 DIAGNOSIS — N186 End stage renal disease: Secondary | ICD-10-CM | POA: Diagnosis not present

## 2014-10-15 DIAGNOSIS — J449 Chronic obstructive pulmonary disease, unspecified: Secondary | ICD-10-CM | POA: Diagnosis not present

## 2014-10-16 ENCOUNTER — Ambulatory Visit (INDEPENDENT_AMBULATORY_CARE_PROVIDER_SITE_OTHER): Payer: Medicare Other | Admitting: Pharmacist Clinician (PhC)/ Clinical Pharmacy Specialist

## 2014-10-16 DIAGNOSIS — N186 End stage renal disease: Secondary | ICD-10-CM | POA: Diagnosis not present

## 2014-10-16 DIAGNOSIS — I480 Paroxysmal atrial fibrillation: Secondary | ICD-10-CM | POA: Diagnosis not present

## 2014-10-16 DIAGNOSIS — Z7901 Long term (current) use of anticoagulants: Secondary | ICD-10-CM | POA: Diagnosis not present

## 2014-10-16 DIAGNOSIS — E1129 Type 2 diabetes mellitus with other diabetic kidney complication: Secondary | ICD-10-CM | POA: Diagnosis not present

## 2014-10-16 DIAGNOSIS — N2581 Secondary hyperparathyroidism of renal origin: Secondary | ICD-10-CM | POA: Diagnosis not present

## 2014-10-16 LAB — POCT INR: INR: 1.3

## 2014-10-18 DIAGNOSIS — N2581 Secondary hyperparathyroidism of renal origin: Secondary | ICD-10-CM | POA: Diagnosis not present

## 2014-10-18 DIAGNOSIS — E1129 Type 2 diabetes mellitus with other diabetic kidney complication: Secondary | ICD-10-CM | POA: Diagnosis not present

## 2014-10-18 DIAGNOSIS — N186 End stage renal disease: Secondary | ICD-10-CM | POA: Diagnosis not present

## 2014-10-21 ENCOUNTER — Telehealth: Payer: Self-pay | Admitting: Internal Medicine

## 2014-10-21 DIAGNOSIS — N2581 Secondary hyperparathyroidism of renal origin: Secondary | ICD-10-CM | POA: Diagnosis not present

## 2014-10-21 DIAGNOSIS — E1129 Type 2 diabetes mellitus with other diabetic kidney complication: Secondary | ICD-10-CM | POA: Diagnosis not present

## 2014-10-21 DIAGNOSIS — N186 End stage renal disease: Secondary | ICD-10-CM | POA: Diagnosis not present

## 2014-10-21 MED ORDER — MIDODRINE HCL 10 MG PO TABS: ORAL_TABLET | ORAL | Status: DC

## 2014-10-21 NOTE — Telephone Encounter (Signed)
Patient has been having increased weakness and dizziness over the past month. Patient and his wife reports that it is to the extent that at times he feels he is going to "pass out" and he has fallen a few times due to the weakness. Wife states he is sometimes unable to even get out of the car on dialysis days (MWF) and usually has to have assistance with ambulating to make sure he does not fall. Reports that also at dialysis there are times that his BP systolically "bottoms out to around 60). Denies irregular heartrate. States he has not been taking Midodrine and is unsure why or when he stopped. Reviewed by [name]. Advised for patient to resume taking low dose Midodrine as ordered. Wife states they do not have Rx and could we call it in to [facility] on [address]. This has been completed. Patient is to call office back should he not see any improvement once on Midodrine. Also encouraged him to reach out to nephrologist to report hypotensive episodes with dialysis treatments. Wife verbalized understanding and agreement.

## 2014-10-21 NOTE — Telephone Encounter (Signed)
Patient has been having dizzy spells for the past couple of weeks.  Once in a while he is sick on his stomach with these spells.  Please call.

## 2014-10-23 DIAGNOSIS — N2581 Secondary hyperparathyroidism of renal origin: Secondary | ICD-10-CM | POA: Diagnosis not present

## 2014-10-23 DIAGNOSIS — N186 End stage renal disease: Secondary | ICD-10-CM | POA: Diagnosis not present

## 2014-10-23 DIAGNOSIS — E1129 Type 2 diabetes mellitus with other diabetic kidney complication: Secondary | ICD-10-CM | POA: Diagnosis not present

## 2014-10-25 ENCOUNTER — Encounter: Payer: Self-pay | Admitting: Internal Medicine

## 2014-10-25 DIAGNOSIS — N2581 Secondary hyperparathyroidism of renal origin: Secondary | ICD-10-CM | POA: Diagnosis not present

## 2014-10-25 DIAGNOSIS — E1129 Type 2 diabetes mellitus with other diabetic kidney complication: Secondary | ICD-10-CM | POA: Diagnosis not present

## 2014-10-25 DIAGNOSIS — N186 End stage renal disease: Secondary | ICD-10-CM | POA: Diagnosis not present

## 2014-10-27 DIAGNOSIS — Z992 Dependence on renal dialysis: Secondary | ICD-10-CM | POA: Diagnosis not present

## 2014-10-27 DIAGNOSIS — N186 End stage renal disease: Secondary | ICD-10-CM | POA: Diagnosis not present

## 2014-10-27 DIAGNOSIS — E1129 Type 2 diabetes mellitus with other diabetic kidney complication: Secondary | ICD-10-CM | POA: Diagnosis not present

## 2014-10-28 ENCOUNTER — Ambulatory Visit (INDEPENDENT_AMBULATORY_CARE_PROVIDER_SITE_OTHER): Payer: Medicare Other | Admitting: Pharmacist Clinician (PhC)/ Clinical Pharmacy Specialist

## 2014-10-28 DIAGNOSIS — N186 End stage renal disease: Secondary | ICD-10-CM | POA: Diagnosis not present

## 2014-10-28 DIAGNOSIS — Z7901 Long term (current) use of anticoagulants: Secondary | ICD-10-CM | POA: Diagnosis not present

## 2014-10-28 DIAGNOSIS — I480 Paroxysmal atrial fibrillation: Secondary | ICD-10-CM

## 2014-10-28 DIAGNOSIS — D631 Anemia in chronic kidney disease: Secondary | ICD-10-CM | POA: Diagnosis not present

## 2014-10-28 DIAGNOSIS — N2581 Secondary hyperparathyroidism of renal origin: Secondary | ICD-10-CM | POA: Diagnosis not present

## 2014-10-28 DIAGNOSIS — E1129 Type 2 diabetes mellitus with other diabetic kidney complication: Secondary | ICD-10-CM | POA: Diagnosis not present

## 2014-10-28 DIAGNOSIS — D509 Iron deficiency anemia, unspecified: Secondary | ICD-10-CM | POA: Diagnosis not present

## 2014-10-28 LAB — POCT INR: INR: 3.3

## 2014-10-29 DIAGNOSIS — I639 Cerebral infarction, unspecified: Secondary | ICD-10-CM | POA: Diagnosis not present

## 2014-10-29 DIAGNOSIS — I6523 Occlusion and stenosis of bilateral carotid arteries: Secondary | ICD-10-CM | POA: Diagnosis not present

## 2014-10-29 DIAGNOSIS — I6529 Occlusion and stenosis of unspecified carotid artery: Secondary | ICD-10-CM | POA: Diagnosis not present

## 2014-10-30 ENCOUNTER — Telehealth: Payer: Self-pay | Admitting: Internal Medicine

## 2014-10-30 ENCOUNTER — Ambulatory Visit: Payer: Medicare Other | Admitting: Pharmacist Clinician (PhC)/ Clinical Pharmacy Specialist

## 2014-10-30 DIAGNOSIS — N2581 Secondary hyperparathyroidism of renal origin: Secondary | ICD-10-CM | POA: Diagnosis not present

## 2014-10-30 DIAGNOSIS — D509 Iron deficiency anemia, unspecified: Secondary | ICD-10-CM | POA: Diagnosis not present

## 2014-10-30 DIAGNOSIS — N186 End stage renal disease: Secondary | ICD-10-CM | POA: Diagnosis not present

## 2014-10-30 DIAGNOSIS — E1129 Type 2 diabetes mellitus with other diabetic kidney complication: Secondary | ICD-10-CM | POA: Diagnosis not present

## 2014-10-30 DIAGNOSIS — D631 Anemia in chronic kidney disease: Secondary | ICD-10-CM | POA: Diagnosis not present

## 2014-10-30 NOTE — Telephone Encounter (Signed)
Wife is frustrated and patient is feeling bad but not able to be specific as to "What" feels bad.  Attributes it to the Midodrine he is on.  Wife states that the medication is too strong for him.  Each time after dialysis he passes out.  Wife wants to know if he can take a different medication.  Said she cut it down to 1/2 but it still happens and he feels bad.  I told her that I would send the messag to dr. Geralyn Corwin and Dr. Lorrene Reid for advice.

## 2014-10-30 NOTE — Telephone Encounter (Signed)
The Midodrine is too strong for the pt.His blood pressure drops when he stands up and he passes out. Is there a lower dose or something else he can take? Pt is also on dialysis.

## 2014-11-01 DIAGNOSIS — D509 Iron deficiency anemia, unspecified: Secondary | ICD-10-CM | POA: Diagnosis not present

## 2014-11-01 DIAGNOSIS — N186 End stage renal disease: Secondary | ICD-10-CM | POA: Diagnosis not present

## 2014-11-01 DIAGNOSIS — D631 Anemia in chronic kidney disease: Secondary | ICD-10-CM | POA: Diagnosis not present

## 2014-11-01 DIAGNOSIS — E1129 Type 2 diabetes mellitus with other diabetic kidney complication: Secondary | ICD-10-CM | POA: Diagnosis not present

## 2014-11-01 DIAGNOSIS — N2581 Secondary hyperparathyroidism of renal origin: Secondary | ICD-10-CM | POA: Diagnosis not present

## 2014-11-04 ENCOUNTER — Telehealth: Payer: Self-pay | Admitting: Internal Medicine

## 2014-11-04 DIAGNOSIS — N2581 Secondary hyperparathyroidism of renal origin: Secondary | ICD-10-CM | POA: Diagnosis not present

## 2014-11-04 DIAGNOSIS — E1129 Type 2 diabetes mellitus with other diabetic kidney complication: Secondary | ICD-10-CM | POA: Diagnosis not present

## 2014-11-04 DIAGNOSIS — D631 Anemia in chronic kidney disease: Secondary | ICD-10-CM | POA: Diagnosis not present

## 2014-11-04 DIAGNOSIS — D509 Iron deficiency anemia, unspecified: Secondary | ICD-10-CM | POA: Diagnosis not present

## 2014-11-04 DIAGNOSIS — N186 End stage renal disease: Secondary | ICD-10-CM | POA: Diagnosis not present

## 2014-11-04 NOTE — Telephone Encounter (Signed)
Alexander [name] is calling again. She left a message last week about  Alexander [name] bp dropping and the medication(midodrine hcl 10mg )  being too strong . Has not heard back from anyone . Please call    Thanks

## 2014-11-04 NOTE — Telephone Encounter (Signed)
Spoke with patient's wife. She reports patient has been having low BPs during dialysis, as low as 60s SBP. She reports dialysis will not release him until his SBP is >100 but it drops once he leaves and he passed out sometimes. She reports he does not feel good. Asked about non-dialysis day BPs and she states yesterday it was 108/87. She reports that patient thinks his midodrine is too strong and he has only been taking 1/4 tablet daily. Explained that midodrine was Rx'ed to help raise his BP and he should be taking the full 10mg  of midodrine every day.   Informed patient's wife I would notify [name] of their concerns regarding his BP but advised that he take midodrine as prescribed as this full dose may help his BP. He can break this up during the day if he wishes.

## 2014-11-05 NOTE — Telephone Encounter (Signed)
The midodrine is used to increase his blood pressure and should be taken before dialysis. Reducing the dose will make it less effective.  He should take more - such as 20 mg if the 10 mg dose is not helping.  Dr. Lemmie Evens

## 2014-11-06 ENCOUNTER — Telehealth: Payer: Self-pay

## 2014-11-06 DIAGNOSIS — N186 End stage renal disease: Secondary | ICD-10-CM | POA: Diagnosis not present

## 2014-11-06 DIAGNOSIS — D509 Iron deficiency anemia, unspecified: Secondary | ICD-10-CM | POA: Diagnosis not present

## 2014-11-06 DIAGNOSIS — E1129 Type 2 diabetes mellitus with other diabetic kidney complication: Secondary | ICD-10-CM | POA: Diagnosis not present

## 2014-11-06 DIAGNOSIS — D631 Anemia in chronic kidney disease: Secondary | ICD-10-CM | POA: Diagnosis not present

## 2014-11-06 DIAGNOSIS — N2581 Secondary hyperparathyroidism of renal origin: Secondary | ICD-10-CM | POA: Diagnosis not present

## 2014-11-06 NOTE — Telephone Encounter (Signed)
Called patients wife and discussed with her the purpose of the medication and actually if  10 mg is not helping he should be on 20 mg.  She said it just doesn't help he feels so bad.  Explained to her it was to raise his blood pressure.

## 2014-11-07 ENCOUNTER — Telehealth: Payer: Self-pay | Admitting: Internal Medicine

## 2014-11-07 NOTE — Telephone Encounter (Signed)
Alexander [name] is calling because  Alexander [name] blood pressure is staying in the low 70's , right now it is 76/59 and he has Taken a 1 1/2 bp pill today . Please call   Thanks

## 2014-11-07 NOTE — Telephone Encounter (Signed)
Wife called said his blood pressure is staying in the low 70's/ right now it is 76/59 Has taken 1 1/2 B/P pills today  Reviewed chart and see that we have instructed patients wife how her husband needs to take the Midodrine 10 mg and if that isn't bringing his blood pressure up he needs to be on 20 mg  So he had 1/2 pill (5mg ) at 0800 and 1 pill (10mg ) at 1130am  'Last b/p 76/54  Can't stand up   If he does he gets real weak  Fluid restriction 35 ounces daily.  Has had 20 oz today.  Does make some urine.  B/p pressure yesterday in dialysisi101/  Last evening b/p at home 113/  Spoke to [place]  at dialysis center  Dr. Krystal Eaton has left.  Patients blood pressure post dialysis yesterday prior to DC was 101/   Discussed with DOD Dr. Lovett Calender back wife and advised her that if b/p stays low and he is weak she should call 9-1-1 and take him to the ED

## 2014-11-08 DIAGNOSIS — E1129 Type 2 diabetes mellitus with other diabetic kidney complication: Secondary | ICD-10-CM | POA: Diagnosis not present

## 2014-11-08 DIAGNOSIS — N2581 Secondary hyperparathyroidism of renal origin: Secondary | ICD-10-CM | POA: Diagnosis not present

## 2014-11-08 DIAGNOSIS — N186 End stage renal disease: Secondary | ICD-10-CM | POA: Diagnosis not present

## 2014-11-08 DIAGNOSIS — D631 Anemia in chronic kidney disease: Secondary | ICD-10-CM | POA: Diagnosis not present

## 2014-11-08 DIAGNOSIS — D509 Iron deficiency anemia, unspecified: Secondary | ICD-10-CM | POA: Diagnosis not present

## 2014-11-11 ENCOUNTER — Ambulatory Visit (INDEPENDENT_AMBULATORY_CARE_PROVIDER_SITE_OTHER): Payer: Medicare Other | Admitting: Pharmacist Clinician (PhC)/ Clinical Pharmacy Specialist

## 2014-11-11 DIAGNOSIS — D509 Iron deficiency anemia, unspecified: Secondary | ICD-10-CM | POA: Diagnosis not present

## 2014-11-11 DIAGNOSIS — I480 Paroxysmal atrial fibrillation: Secondary | ICD-10-CM

## 2014-11-11 DIAGNOSIS — N186 End stage renal disease: Secondary | ICD-10-CM | POA: Diagnosis not present

## 2014-11-11 DIAGNOSIS — Z7901 Long term (current) use of anticoagulants: Secondary | ICD-10-CM | POA: Diagnosis not present

## 2014-11-11 DIAGNOSIS — D631 Anemia in chronic kidney disease: Secondary | ICD-10-CM | POA: Diagnosis not present

## 2014-11-11 DIAGNOSIS — E1129 Type 2 diabetes mellitus with other diabetic kidney complication: Secondary | ICD-10-CM | POA: Diagnosis not present

## 2014-11-11 DIAGNOSIS — N2581 Secondary hyperparathyroidism of renal origin: Secondary | ICD-10-CM | POA: Diagnosis not present

## 2014-11-11 LAB — POCT INR: INR: 3.3

## 2014-11-12 ENCOUNTER — Emergency Department (HOSPITAL_COMMUNITY): Payer: Medicare Other

## 2014-11-12 ENCOUNTER — Emergency Department (HOSPITAL_COMMUNITY)
Admission: EM | Admit: 2014-11-12 | Discharge: 2014-11-12 | Disposition: A | Discharge status: Home / Self Care | Payer: Medicare Other | Attending: Emergency Medicine | Admitting: Emergency Medicine

## 2014-11-12 ENCOUNTER — Encounter (HOSPITAL_COMMUNITY): Payer: Self-pay | Admitting: Emergency Medicine

## 2014-11-12 DIAGNOSIS — N186 End stage renal disease: Secondary | ICD-10-CM | POA: Insufficient documentation

## 2014-11-12 DIAGNOSIS — I120 Hypertensive chronic kidney disease with stage 5 chronic kidney disease or end stage renal disease: Secondary | ICD-10-CM | POA: Insufficient documentation

## 2014-11-12 DIAGNOSIS — Z87442 Personal history of urinary calculi: Secondary | ICD-10-CM | POA: Diagnosis not present

## 2014-11-12 DIAGNOSIS — W1839XA Other fall on same level, initial encounter: Secondary | ICD-10-CM | POA: Diagnosis not present

## 2014-11-12 DIAGNOSIS — Y998 Other external cause status: Secondary | ICD-10-CM | POA: Insufficient documentation

## 2014-11-12 DIAGNOSIS — Z79899 Other long term (current) drug therapy: Secondary | ICD-10-CM | POA: Insufficient documentation

## 2014-11-12 DIAGNOSIS — Z87891 Personal history of nicotine dependence: Secondary | ICD-10-CM | POA: Insufficient documentation

## 2014-11-12 DIAGNOSIS — K219 Gastro-esophageal reflux disease without esophagitis: Secondary | ICD-10-CM | POA: Diagnosis not present

## 2014-11-12 DIAGNOSIS — I951 Orthostatic hypotension: Secondary | ICD-10-CM | POA: Diagnosis not present

## 2014-11-12 DIAGNOSIS — Z992 Dependence on renal dialysis: Secondary | ICD-10-CM | POA: Insufficient documentation

## 2014-11-12 DIAGNOSIS — Z880 Allergy status to penicillin: Secondary | ICD-10-CM | POA: Insufficient documentation

## 2014-11-12 DIAGNOSIS — S0993XA Unspecified injury of face, initial encounter: Secondary | ICD-10-CM | POA: Insufficient documentation

## 2014-11-12 DIAGNOSIS — F329 Major depressive disorder, single episode, unspecified: Secondary | ICD-10-CM | POA: Insufficient documentation

## 2014-11-12 DIAGNOSIS — R296 Repeated falls: Secondary | ICD-10-CM

## 2014-11-12 DIAGNOSIS — E119 Type 2 diabetes mellitus without complications: Secondary | ICD-10-CM | POA: Diagnosis not present

## 2014-11-12 DIAGNOSIS — Z7901 Long term (current) use of anticoagulants: Secondary | ICD-10-CM | POA: Diagnosis not present

## 2014-11-12 DIAGNOSIS — Z8673 Personal history of transient ischemic attack (TIA), and cerebral infarction without residual deficits: Secondary | ICD-10-CM | POA: Insufficient documentation

## 2014-11-12 DIAGNOSIS — R55 Syncope and collapse: Secondary | ICD-10-CM

## 2014-11-12 DIAGNOSIS — Y9389 Activity, other specified: Secondary | ICD-10-CM | POA: Insufficient documentation

## 2014-11-12 DIAGNOSIS — Z8711 Personal history of peptic ulcer disease: Secondary | ICD-10-CM | POA: Diagnosis not present

## 2014-11-12 DIAGNOSIS — Y9289 Other specified places as the place of occurrence of the external cause: Secondary | ICD-10-CM | POA: Diagnosis not present

## 2014-11-12 DIAGNOSIS — I4891 Unspecified atrial fibrillation: Secondary | ICD-10-CM | POA: Diagnosis not present

## 2014-11-12 DIAGNOSIS — R404 Transient alteration of awareness: Secondary | ICD-10-CM | POA: Diagnosis not present

## 2014-11-12 DIAGNOSIS — E785 Hyperlipidemia, unspecified: Secondary | ICD-10-CM | POA: Diagnosis not present

## 2014-11-12 DIAGNOSIS — S0990XA Unspecified injury of head, initial encounter: Secondary | ICD-10-CM | POA: Diagnosis not present

## 2014-11-12 DIAGNOSIS — Z86010 Personal history of colonic polyps: Secondary | ICD-10-CM | POA: Diagnosis not present

## 2014-11-12 DIAGNOSIS — S199XXA Unspecified injury of neck, initial encounter: Secondary | ICD-10-CM | POA: Diagnosis not present

## 2014-11-12 DIAGNOSIS — I639 Cerebral infarction, unspecified: Secondary | ICD-10-CM | POA: Diagnosis not present

## 2014-11-12 LAB — BASIC METABOLIC PANEL
ANION GAP: 12 (ref 5–15)
BUN: 36 mg/dL — ABNORMAL HIGH (ref 6–20)
CALCIUM: 8.9 mg/dL (ref 8.9–10.3)
CO2: 26 mmol/L (ref 22–32)
Chloride: 102 mmol/L (ref 101–111)
Creatinine, Ser: 7.49 mg/dL — ABNORMAL HIGH (ref 0.61–1.24)
GFR, EST AFRICAN AMERICAN: 7 mL/min — AB (ref >60)
GFR, EST NON AFRICAN AMERICAN: 6 mL/min — AB (ref >60)
GLUCOSE: 94 mg/dL (ref 65–99)
Potassium: 4 mmol/L (ref 3.5–5.1)
SODIUM: 140 mmol/L (ref 135–145)

## 2014-11-12 LAB — URINALYSIS, ROUTINE W REFLEX MICROSCOPIC
GLUCOSE, UA: NEGATIVE mg/dL
KETONES UR: NEGATIVE mg/dL
Nitrite: NEGATIVE
PROTEIN: 100 mg/dL — AB
Specific Gravity, Urine: 1.016 (ref 1.005–1.030)
UROBILINOGEN UA: 0.2 mg/dL (ref 0.0–1.0)
pH: 5.5 (ref 5.0–8.0)

## 2014-11-12 LAB — CBC
HCT: 33.4 % — ABNORMAL LOW (ref 39.0–52.0)
HEMOGLOBIN: 11.2 g/dL — AB (ref 13.0–17.0)
MCH: 33.8 pg (ref 26.0–34.0)
MCHC: 33.5 g/dL (ref 30.0–36.0)
MCV: 100.9 fL — ABNORMAL HIGH (ref 78.0–100.0)
Platelets: 224 10*3/uL (ref 150–400)
RBC: 3.31 MIL/uL — ABNORMAL LOW (ref 4.22–5.81)
RDW: 16.2 % — ABNORMAL HIGH (ref 11.5–15.5)
WBC: 7.9 10*3/uL (ref 4.0–10.5)

## 2014-11-12 LAB — URINE MICROSCOPIC-ADD ON

## 2014-11-12 LAB — PROTIME-INR
INR: 2.06 — AB (ref 0.00–1.49)
Prothrombin Time: 23 seconds — ABNORMAL HIGH (ref 11.6–15.2)

## 2014-11-12 LAB — CBG MONITORING, ED: GLUCOSE-CAPILLARY: 93 mg/dL (ref 65–99)

## 2014-11-12 NOTE — ED Notes (Signed)
Phlebotomy on the way to collect PT-INR

## 2014-11-12 NOTE — ED Notes (Signed)
Patient requesting to take c-collar off, explained to patient that it needs to stay on until the physician clears his c-spine.

## 2014-11-12 NOTE — ED Provider Notes (Signed)
CSN: [id number]     Arrival date & time 11/12/14  1201 History   First MD Initiated Contact with Patient 11/12/14 1212     Chief Complaint  Patient presents with  . Loss of Consciousness  . Fall  . Facial Injury     HPI Patient reports over the last several weeks he has fallen multiple times per day.  He states that he stands up and becomes lightheaded and then starts to feel "drunk".  He has difficulty ambulating.  He states he does not grab onto something he will fall.  Sometimes he will have a syncopal episode sometimes he will not.  He has a history of prior posterior stroke.  He is a dialysis patient and dialyzes Monday Wednesday Friday.  He had a normal run of dialysis yesterday.  He has not spoken with his dialysis team about his lightheadedness and what sounds like orthostatic symptoms.  He denies chest pain and palpitations.  He denies neck pain.  No recent injury or trauma.   Past Medical History  Diagnosis Date  . End stage renal disease     hemodialysis 3 times a week  . Seasonal allergies   . Hyperlipidemia   . Anemia   . Depression   . Macular degeneration     both eyes  . GERD (gastroesophageal reflux disease)   . Hypertension   . CVA (cerebral infarction)     2004/affected left side  . Peptic ulcer     bleeding, 1969  . Kidney stones   . Renal insufficiency   . Diverticulosis   . Tubular adenoma of colon 07/2001  . Barrett's esophagus 05/2003  . Stroke 2004  . Colon polyps   . Renal failure   . Hemodialysis patient   . Aortic stenosis 06/15/12    TEE - EF 29-92%; grade 1 diastolic dysfunction; mild/mod aortic valve stenosis; Mitral valve had calcified annulus, mild pulm htn PA peak pressure 29mmHg  . Atrial fibrillation 04/01/10    14 day event monitor - some sinus bradycardia, PACs and sinus tachycardia  . Atrial fibrillation 11/03/09    R/P MV - EF 66%; normal myocardial perfusion study, w/o chest pain or EKG changes for ischemia  . S/P epidural steroid  injection     last  injection over 10 years ago  . Diabetes mellitus without complication    Past Surgical History  Procedure Laterality Date  . Av fistula placement  2009  . Laminectomy  1969  . Tonsillectomy  1964  . 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 Cystoscopy  several times    kidney stones  . Back surgery     Family History  Problem Relation Age of Onset  . Colon cancer Neg Hx   . Stomach cancer Mother   . Hypertension Father     Died of heart attack   Social History  Substance Use Topics  . Smoking status: Former Smoker    Quit date: 01/20/1998  . Smokeless tobacco: Never Used  . Alcohol Use: No    Review of Systems  All other systems reviewed and are negative.     Allergies  Codeine; Penicillins; and Tramadol  Home Medications   Prior to Admission medications   Medication Sig Start Date End Date Taking? Authorizing Provider  acetaminophen (TYLENOL) 500 MG tablet Take 1,000 mg by mouth every 6 (six) hours as needed for pain.    Historical Provider, MD  amiodarone (PACERONE) 200 MG tablet  Take 2 tablets (400 mg total) by mouth daily. 07/04/14   Pixie Casino, MD  atorvastatin (LIPITOR) 40 MG tablet Take 20 mg by mouth daily.    Historical Provider, MD  b complex-vitamin c-folic acid (NEPHRO-VITE) 0.8 MG TABS Take 0.8 mg by mouth daily.     Historical Provider, MD  benzonatate (TESSALON) 200 MG capsule Take 1 capsule (200 mg total) by mouth 3 (three) times daily as needed for cough. Patient not taking: Reported on 06/27/2014 04/06/14   Ripudeep Krystal Eaton, MD  bromocriptine (PARLODEL) 5 MG capsule Take 5 mg by mouth at bedtime.  11/30/10   Historical Provider, MD  calcium acetate (PHOSLO) 667 MG capsule Take 667 mg by mouth 3 (three) times daily with meals. Take 2 tablets each morning, 1 at mid-day and 1 each evening    Historical Provider, MD  cetirizine (ZYRTEC) 10 MG tablet Take 10 mg by mouth daily.    Historical Provider, MD  cinacalcet (SENSIPAR) 30 MG  tablet Take 30 mg by mouth every other day.     Historical Provider, MD  dorzolamide-timolol (COSOPT) 22.3-6.8 MG/ML ophthalmic solution Place 1 drop into the right eye 2 (two) times daily. 08/13/13   Historical Provider, MD  enoxaparin (LOVENOX) 30 MG/0.3ML injection Inject 0.3 mLs (30 mg total) into the skin daily. 10/04/14   Pixie Casino, MD  FLUoxetine (PROZAC) 20 MG capsule Take 40 mg by mouth every morning.  11/30/10   Historical Provider, MD  HYDROcodone-homatropine (HYCODAN) 5-1.5 MG/5ML syrup Take 5 mLs by mouth every 6 (six) hours as needed for cough. Patient not taking: Reported on 06/27/2014 04/05/14   Ripudeep K Rai, MD  ipratropium (ATROVENT) 0.03 % nasal spray Place 2 sprays into the nose at bedtime.    Historical Provider, MD  latanoprost (XALATAN) 0.005 % ophthalmic solution Place 1 drop into both eyes at bedtime. 02/13/13   Historical Provider, MD  levalbuterol (XOPENEX HFA) 45 MCG/ACT inhaler Inhale 1 puff into the lungs every 8 (eight) hours as needed for wheezing. Patient not taking: Reported on 06/27/2014 04/06/14   Ripudeep Krystal Eaton, MD  loratadine (CLARITIN) 10 MG tablet Take 1 tablet (10 mg total) by mouth daily. Patient not taking: Reported on 06/27/2014 04/06/14   Ripudeep Krystal Eaton, MD  midodrine (PROAMATINE) 10 MG tablet Midodrine 10 mg - Take (1) ONE tablet (10 mg total) by mouth daily. 10/21/14   Pixie Casino, MD  omeprazole (PRILOSEC) 20 MG capsule Take 1 capsule (20 mg total) by mouth 2 (two) times daily before a meal. 04/06/14   Ripudeep K Rai, MD  pantoprazole (PROTONIX) 40 MG tablet Take 1 tablet (40 mg total) by mouth daily. Patient not taking: Reported on 06/27/2014 04/05/14   Ripudeep Krystal Eaton, MD  predniSONE (DELTASONE) 10 MG tablet Prednisone dosing: Take  Prednisone 40mg  (4 tabs) x 2 days, then taper to 30mg  (3 tabs) x 3 days, then 20mg  (2 tabs) x 3days, then 10mg  (1 tab) x 3days, then OFF.  Dispense:  26 tabs, refills: None Patient not taking: Reported on 06/27/2014 04/07/14    Ripudeep Krystal Eaton, MD  traMADol (ULTRAM) 50 MG tablet Take 1 tablet (50 mg total) by mouth every 8 (eight) hours as needed for moderate pain. Patient not taking: Reported on 06/27/2014 04/06/14   Ripudeep Krystal Eaton, MD  warfarin (COUMADIN) 2.5 MG tablet Take 1.5 to 2 tablets by mouth daily as directed by coumadin clinic Patient taking differently: Take 3.75-5 mg by mouth daily. Takes 2  tablets (5 mg) on Mondays and Fridays.  Takes 1.5 tablets (3.75 mg) on all remaining days. 06/17/14   Pixie Casino, MD   BP 125/48 mmHg  Pulse 52  Temp(Src) 98.4 F (36.9 C) (Oral)  Resp 17  SpO2 94% Physical Exam  Constitutional: He is oriented to person, place, and time. He appears well-developed and well-nourished.  HENT:  Head: Normocephalic and atraumatic.  Eyes: EOM are normal. Pupils are equal, round, and reactive to light.  Neck: Normal range of motion.  Cardiovascular: Normal rate, regular rhythm, normal heart sounds and intact distal pulses.   Pulmonary/Chest: Effort normal and breath sounds normal. No respiratory distress.  Abdominal: Soft. He exhibits no distension. There is no tenderness.  Musculoskeletal: Normal range of motion.  Neurological: He is alert and oriented to person, place, and time.  5/5 strength in major muscle groups of  bilateral upper and lower extremities. Speech normal. No facial asymetry.   Skin: Skin is warm and dry.  Psychiatric: He has a normal mood and affect. Judgment normal.  Nursing note and vitals reviewed.   ED Course  Procedures (including critical care time) Labs Review Labs Reviewed  BASIC METABOLIC PANEL - Abnormal; Notable for the following:    BUN 36 (*)    Creatinine, Ser 7.49 (*)    GFR calc non Af Amer 6 (*)    GFR calc Af Amer 7 (*)    All other components within normal limits  CBC - Abnormal; Notable for the following:    RBC 3.31 (*)    Hemoglobin 11.2 (*)    HCT 33.4 (*)    MCV 100.9 (*)    RDW 16.2 (*)    All other components within normal  limits  URINALYSIS, ROUTINE W REFLEX MICROSCOPIC (NOT AT [facility]) - Abnormal; Notable for the following:    Hgb urine dipstick MODERATE (*)    Bilirubin Urine SMALL (*)    Protein, ur 100 (*)    Leukocytes, UA MODERATE (*)    All other components within normal limits  URINE MICROSCOPIC-ADD ON - Abnormal; Notable for the following:    Squamous Epithelial / LPF FEW (*)    Bacteria, UA FEW (*)    Casts HYALINE CASTS (*)    All other components within normal limits  URINALYSIS, ROUTINE W REFLEX MICROSCOPIC (NOT AT [facility])  PROTIME-INR  CBG MONITORING, ED    Imaging Review Ct Head Wo Contrast  11/12/2014   CLINICAL DATA:  Status post fall, multiple recent falls  EXAM: CT HEAD WITHOUT CONTRAST  CT CERVICAL SPINE WITHOUT CONTRAST  TECHNIQUE: Multidetector CT imaging of the head and cervical spine was performed following the standard protocol without intravenous contrast. Multiplanar CT image reconstructions of the cervical spine were also generated.  COMPARISON:  1/31/ 11  FINDINGS: CT HEAD FINDINGS  Again noted remote left PCA infarct with encephalomalacia. No skull fracture. No intracranial hemorrhage, mass effect or midline shift. Stable atrophy and chronic periventricular white matter disease. No definite acute cortical infarction.  CT CERVICAL SPINE FINDINGS  Axial images of the cervical spine shows no acute fracture. Computer processed images shows degenerative changes C1-C2 articulation. No acute fracture. Pannus formation noted surrounding C2 odontoid. There is about 4 mm anterolisthesis C3 on C4 vertebral body. Significant disc space flattening with moderate anterior and mild posterior spurring at C4-C5 level. Significant disc space flattening with mild anterior and mild posterior spurring at C5-C6 and C6-C7 level. No prevertebral soft tissue swelling. Cervical airway is patent.  There is no pneumothorax in visualized lung apices. Atherosclerotic calcifications are noted bilateral carotid  bifurcation.  IMPRESSION: 1. No acute intracranial abnormality. Stable remote left PCA infarct with encephalomalacia. Stable atrophy and chronic white matter disease. 2. No cervical spine acute fracture. There is about 4 mm anterolisthesis C3 on C4 vertebral body. Degenerative changes as described above. No prevertebral soft tissue swelling.   Electronically Signed   By: Lahoma Crocker M.D.   On: 11/12/2014 14:30   Ct Cervical Spine Wo Contrast  11/12/2014   CLINICAL DATA:  Status post fall, multiple recent falls  EXAM: CT HEAD WITHOUT CONTRAST  CT CERVICAL SPINE WITHOUT CONTRAST  TECHNIQUE: Multidetector CT imaging of the head and cervical spine was performed following the standard protocol without intravenous contrast. Multiplanar CT image reconstructions of the cervical spine were also generated.  COMPARISON:  1/31/ 11  FINDINGS: CT HEAD FINDINGS  Again noted remote left PCA infarct with encephalomalacia. No skull fracture. No intracranial hemorrhage, mass effect or midline shift. Stable atrophy and chronic periventricular white matter disease. No definite acute cortical infarction.  CT CERVICAL SPINE FINDINGS  Axial images of the cervical spine shows no acute fracture. Computer processed images shows degenerative changes C1-C2 articulation. No acute fracture. Pannus formation noted surrounding C2 odontoid. There is about 4 mm anterolisthesis C3 on C4 vertebral body. Significant disc space flattening with moderate anterior and mild posterior spurring at C4-C5 level. Significant disc space flattening with mild anterior and mild posterior spurring at C5-C6 and C6-C7 level. No prevertebral soft tissue swelling. Cervical airway is patent.  There is no pneumothorax in visualized lung apices. Atherosclerotic calcifications are noted bilateral carotid bifurcation.  IMPRESSION: 1. No acute intracranial abnormality. Stable remote left PCA infarct with encephalomalacia. Stable atrophy and chronic white matter disease. 2. No  cervical spine acute fracture. There is about 4 mm anterolisthesis C3 on C4 vertebral body. Degenerative changes as described above. No prevertebral soft tissue swelling.   Electronically Signed   By: Lahoma Crocker M.D.   On: 11/12/2014 14:30   I have personally reviewed and evaluated these images and lab results as part of my medical decision-making.   EKG Interpretation   Date/Time:  Tuesday November 12 2014 12:10:32 EDT Ventricular Rate:  56 PR Interval:  269 QRS Duration: 106 QT Interval:  470 QTC Calculation: 454 R Axis:   -26 Text Interpretation:  Sinus rhythm Prolonged PR interval Inferior infarct,  old No significant change was found Confirmed by Dana Debo  MD, Lennette Bihari ([id number])  on 11/12/2014 1:23:10 PM      MDM   Final diagnoses:  Frequent falls    Patient is overall well-appearing.  I tested the patient had bedside does seem somewhat lightheaded when he stands.  This may be a simple volume issue.  This could represent vertebrobasilar insufficiency.  Patient will undergo MRI of his head including MRA head and neck.  If all this is normal I suspect the patient be discharged home safely.  Patient and family is agreeable to this.  I spoke with nephrology who will make some alterations to his dialysis orders to possibly help with volume depletion.  Overall well-appearing.    Jola Schmidt, MD 11/12/14 [phone]

## 2014-11-12 NOTE — ED Provider Notes (Signed)
Assumed care of pt from Dr. Venora Maples awaiting MRI.  No new symptoms recently, however has had several weeks of falls.    MRI demonstrated no acute pathology.  Pt to fu with nephrology for dialysis tomorrow, as symptoms are very likely orthostatic.  DC home in stable condition.  1. Syncope, unspecified syncope type   2. Frequent falls   3. Orthostasis       Debby Freiberg, MD 11/12/14 1800

## 2014-11-12 NOTE — ED Notes (Addendum)
Lab called to add-on, no existing specimen to add lab to

## 2014-11-12 NOTE — ED Notes (Signed)
Pt's wife reports he was prescribed midodrine over the phone by PCP to assist with his BP dropping when standing.  Pt's wife states she thought it was beginning to help but it hasn't. Has not been evaluated in person for this complaint.

## 2014-11-12 NOTE — ED Notes (Signed)
Patient transported to MRI 

## 2014-11-12 NOTE — ED Notes (Signed)
Pt from home via [facility] with c/o syncopal episode while the carport falling face forward onto concrete.  Pt has chipped front teeth, abrasions to nose, upper lip, and bilateral legs, skin tears to hands.  Pt reports he felt dizzy prior to the fall, it also happened earlier today, and it happened for approx 15-20 seconds while speaking with EMS.  Pt reports he does this 2-3 times a day for the last several weeks and has been unable to get into see his PCP.  Pt not orthostatic per EMS, equal grips, no new deficits.  Pt reports he had dialysis yesterday and his BP is usually low and has a decreased ability to clot for a few days.  Pt denies pain, A&Ox4.

## 2014-11-12 NOTE — ED Notes (Signed)
Dr. Gentry at bedside. 

## 2014-11-12 NOTE — ED Notes (Signed)
Patient returned from MRI.

## 2014-11-13 ENCOUNTER — Telehealth: Payer: Self-pay | Admitting: Internal Medicine

## 2014-11-13 DIAGNOSIS — N2581 Secondary hyperparathyroidism of renal origin: Secondary | ICD-10-CM | POA: Diagnosis not present

## 2014-11-13 DIAGNOSIS — E1129 Type 2 diabetes mellitus with other diabetic kidney complication: Secondary | ICD-10-CM | POA: Diagnosis not present

## 2014-11-13 DIAGNOSIS — N186 End stage renal disease: Secondary | ICD-10-CM | POA: Diagnosis not present

## 2014-11-13 DIAGNOSIS — D509 Iron deficiency anemia, unspecified: Secondary | ICD-10-CM | POA: Diagnosis not present

## 2014-11-13 DIAGNOSIS — D631 Anemia in chronic kidney disease: Secondary | ICD-10-CM | POA: Diagnosis not present

## 2014-11-13 NOTE — Telephone Encounter (Signed)
Mr. Alexander [name] passed out again this morning after dialysis.  Please call to discuss his medications and future plan of action.

## 2014-11-13 NOTE — Telephone Encounter (Signed)
Spoke w/ Eliezer Lofts, she and Kyra Manges have both taken recent calls from patient's wife about these syncopal episodes.  They most commonly occur on dialysis days, when pt is having a drop in BP w/ SBP in 70s-mid 80s. He has dialysis MWF.  He also had syncopal event yesterday, went to ED for this d/t injury - was discharged home after eval. "Fairly sore" today.  Currently compliant w/ midodrine 10mg  BID.  Informed wife I would route to [name], see if other med adjustments warranted given freq of falls/syncope.

## 2014-11-15 DIAGNOSIS — D509 Iron deficiency anemia, unspecified: Secondary | ICD-10-CM | POA: Diagnosis not present

## 2014-11-15 DIAGNOSIS — N2581 Secondary hyperparathyroidism of renal origin: Secondary | ICD-10-CM | POA: Diagnosis not present

## 2014-11-15 DIAGNOSIS — D631 Anemia in chronic kidney disease: Secondary | ICD-10-CM | POA: Diagnosis not present

## 2014-11-15 DIAGNOSIS — E1129 Type 2 diabetes mellitus with other diabetic kidney complication: Secondary | ICD-10-CM | POA: Diagnosis not present

## 2014-11-15 DIAGNOSIS — N186 End stage renal disease: Secondary | ICD-10-CM | POA: Diagnosis not present

## 2014-11-18 ENCOUNTER — Encounter (HOSPITAL_COMMUNITY): Payer: Self-pay | Admitting: Emergency Medicine

## 2014-11-18 ENCOUNTER — Emergency Department (HOSPITAL_COMMUNITY)
Admission: EM | Admit: 2014-11-18 | Discharge: 2014-11-18 | Disposition: A | Discharge status: Home / Self Care | Payer: Medicare Other | Attending: Emergency Medicine | Admitting: Emergency Medicine

## 2014-11-18 DIAGNOSIS — E119 Type 2 diabetes mellitus without complications: Secondary | ICD-10-CM | POA: Diagnosis not present

## 2014-11-18 DIAGNOSIS — E785 Hyperlipidemia, unspecified: Secondary | ICD-10-CM | POA: Insufficient documentation

## 2014-11-18 DIAGNOSIS — D631 Anemia in chronic kidney disease: Secondary | ICD-10-CM | POA: Diagnosis not present

## 2014-11-18 DIAGNOSIS — I951 Orthostatic hypotension: Secondary | ICD-10-CM | POA: Insufficient documentation

## 2014-11-18 DIAGNOSIS — Z862 Personal history of diseases of the blood and blood-forming organs and certain disorders involving the immune mechanism: Secondary | ICD-10-CM | POA: Insufficient documentation

## 2014-11-18 DIAGNOSIS — Z8673 Personal history of transient ischemic attack (TIA), and cerebral infarction without residual deficits: Secondary | ICD-10-CM | POA: Insufficient documentation

## 2014-11-18 DIAGNOSIS — Z7901 Long term (current) use of anticoagulants: Secondary | ICD-10-CM | POA: Diagnosis not present

## 2014-11-18 DIAGNOSIS — R55 Syncope and collapse: Secondary | ICD-10-CM | POA: Diagnosis present

## 2014-11-18 DIAGNOSIS — Z87442 Personal history of urinary calculi: Secondary | ICD-10-CM | POA: Insufficient documentation

## 2014-11-18 DIAGNOSIS — N186 End stage renal disease: Secondary | ICD-10-CM | POA: Insufficient documentation

## 2014-11-18 DIAGNOSIS — E1129 Type 2 diabetes mellitus with other diabetic kidney complication: Secondary | ICD-10-CM | POA: Diagnosis not present

## 2014-11-18 DIAGNOSIS — D509 Iron deficiency anemia, unspecified: Secondary | ICD-10-CM | POA: Diagnosis not present

## 2014-11-18 DIAGNOSIS — Z79899 Other long term (current) drug therapy: Secondary | ICD-10-CM | POA: Insufficient documentation

## 2014-11-18 DIAGNOSIS — Z992 Dependence on renal dialysis: Secondary | ICD-10-CM | POA: Diagnosis not present

## 2014-11-18 DIAGNOSIS — Z87891 Personal history of nicotine dependence: Secondary | ICD-10-CM | POA: Insufficient documentation

## 2014-11-18 DIAGNOSIS — R031 Nonspecific low blood-pressure reading: Secondary | ICD-10-CM | POA: Diagnosis not present

## 2014-11-18 DIAGNOSIS — Z880 Allergy status to penicillin: Secondary | ICD-10-CM | POA: Insufficient documentation

## 2014-11-18 DIAGNOSIS — Z8711 Personal history of peptic ulcer disease: Secondary | ICD-10-CM | POA: Insufficient documentation

## 2014-11-18 DIAGNOSIS — N2581 Secondary hyperparathyroidism of renal origin: Secondary | ICD-10-CM | POA: Diagnosis not present

## 2014-11-18 DIAGNOSIS — K219 Gastro-esophageal reflux disease without esophagitis: Secondary | ICD-10-CM | POA: Diagnosis not present

## 2014-11-18 DIAGNOSIS — Z86010 Personal history of colonic polyps: Secondary | ICD-10-CM | POA: Insufficient documentation

## 2014-11-18 DIAGNOSIS — F329 Major depressive disorder, single episode, unspecified: Secondary | ICD-10-CM | POA: Insufficient documentation

## 2014-11-18 DIAGNOSIS — R40241 Glasgow coma scale score 13-15: Secondary | ICD-10-CM | POA: Diagnosis not present

## 2014-11-18 LAB — CBC WITH DIFFERENTIAL/PLATELET
BASOS ABS: 0 10*3/uL (ref 0.0–0.1)
BASOS PCT: 1 % (ref 0–1)
Eosinophils Absolute: 0.1 10*3/uL (ref 0.0–0.7)
Eosinophils Relative: 1 % (ref 0–5)
HEMATOCRIT: 30.4 % — AB (ref 39.0–52.0)
HEMOGLOBIN: 10.4 g/dL — AB (ref 13.0–17.0)
Lymphocytes Relative: 17 % (ref 12–46)
Lymphs Abs: 1.1 10*3/uL (ref 0.7–4.0)
MCH: 34.8 pg — ABNORMAL HIGH (ref 26.0–34.0)
MCHC: 34.2 g/dL (ref 30.0–36.0)
MCV: 101.7 fL — ABNORMAL HIGH (ref 78.0–100.0)
Monocytes Absolute: 0.5 10*3/uL (ref 0.1–1.0)
Monocytes Relative: 8 % (ref 3–12)
NEUTROS ABS: 5 10*3/uL (ref 1.7–7.7)
NEUTROS PCT: 73 % (ref 43–77)
Platelets: 198 10*3/uL (ref 150–400)
RBC: 2.99 MIL/uL — ABNORMAL LOW (ref 4.22–5.81)
RDW: 16.5 % — ABNORMAL HIGH (ref 11.5–15.5)
WBC: 6.7 10*3/uL (ref 4.0–10.5)

## 2014-11-18 LAB — MAGNESIUM: Magnesium: 1.9 mg/dL (ref 1.7–2.4)

## 2014-11-18 LAB — PROTIME-INR
INR: 2.17 — AB (ref 0.00–1.49)
Prothrombin Time: 24 seconds — ABNORMAL HIGH (ref 11.6–15.2)

## 2014-11-18 LAB — COMPREHENSIVE METABOLIC PANEL
ALBUMIN: 3 g/dL — AB (ref 3.5–5.0)
ALT: 65 U/L — AB (ref 17–63)
AST: 42 U/L — AB (ref 15–41)
Alkaline Phosphatase: 64 U/L (ref 38–126)
Anion gap: 9 (ref 5–15)
BILIRUBIN TOTAL: 0.3 mg/dL (ref 0.3–1.2)
BUN: 12 mg/dL (ref 6–20)
CALCIUM: 8.7 mg/dL — AB (ref 8.9–10.3)
CO2: 30 mmol/L (ref 22–32)
Chloride: 100 mmol/L — ABNORMAL LOW (ref 101–111)
Creatinine, Ser: 4.37 mg/dL — ABNORMAL HIGH (ref 0.61–1.24)
GFR calc Af Amer: 14 mL/min — ABNORMAL LOW (ref >60)
GFR calc non Af Amer: 12 mL/min — ABNORMAL LOW (ref >60)
GLUCOSE: 99 mg/dL (ref 65–99)
POTASSIUM: 3.5 mmol/L (ref 3.5–5.1)
Sodium: 139 mmol/L (ref 135–145)
TOTAL PROTEIN: 6.2 g/dL — AB (ref 6.5–8.1)

## 2014-11-18 LAB — PHOSPHORUS: Phosphorus: 2.6 mg/dL (ref 2.5–4.6)

## 2014-11-18 LAB — I-STAT CG4 LACTIC ACID, ED: LACTIC ACID, VENOUS: 0.57 mmol/L (ref 0.5–2.0)

## 2014-11-18 LAB — APTT: aPTT: 53 seconds — ABNORMAL HIGH (ref 24–37)

## 2014-11-18 MED ORDER — MIDODRINE HCL 5 MG PO TABS
10.0000 mg | ORAL_TABLET | Freq: Once | ORAL | Status: AC
  Administered 2014-11-18: 10 mg via ORAL
  Filled 2014-11-18: qty 2

## 2014-11-18 NOTE — ED Notes (Signed)
Got patient up to ambulate. Patient unsteady on feet. Patient returned to bed, side rails up, call bell at bedside.

## 2014-11-18 NOTE — ED Notes (Signed)
Dr. Oleta Mouse at bedside with patient.

## 2014-11-18 NOTE — Discharge Instructions (Signed)
I discussed your plan of care with the nephrologist on-call today, Dr. Marval Regal. He has discussed with the dialysis center to increase her dry weight by 1 kg. He has also recommended that you take your Midodrine 10 mg two times a day (MWF) on the days that you have dialysis. On Tu, Th, Sat, Sun he will take Midorine 10 mg once a day.  Please return without fail for worsening symptoms, including persistent lightheadedness, recurrence of passing out, chest pain, difficulty breathing, or any other symptoms concerning to you.  Orthostatic Hypotension Orthostatic hypotension is a sudden drop in blood pressure. It happens when you quickly stand up from a seated or lying position. You may feel dizzy or light-headed. This can last for just a few seconds or for up to a few minutes. It is usually not a serious problem. However, if this happens frequently or gets worse, it can be a sign of something more serious. CAUSES  Different things can cause orthostatic hypotension, including:   Loss of body fluids (dehydration).  Medicines that lower blood pressure.  Sudden changes in posture, such as standing up quickly after you have been sitting or lying down.  Taking too much of your medicine. SIGNS AND SYMPTOMS   Light-headedness or dizziness.   Fainting or near-fainting.   A fast heart rate.   Weakness.   Feeling tired (fatigue).  DIAGNOSIS  Your health care provider may do several things to help diagnose your condition and identify the cause. These may include:   Taking a medical history and doing a physical exam.  Checking your blood pressure. Your health care provider will check your blood pressure when you are:  Lying down.  Sitting.  Standing.  Using tilt table testing. In this test, you lie down on a table that moves from a lying position to a standing position. You will be strapped onto the table. This test monitors your blood pressure and heart rate when you are in different  positions. TREATMENT  Treatment will vary depending on the cause. Possible treatments include:   Changing the dosage of your medicines.  Wearing compression stockings on your lower legs.  Standing up slowly after sitting or lying down.  Eating more salt.  Eating frequent, small meals.  In some cases, getting IV fluids.  Taking medicine to enhance fluid retention. HOME CARE INSTRUCTIONS  Only take over-the-counter or prescription medicines as directed by your health care provider.  Follow your health care provider's instructions for changing the dosage of your current medicines.  Do not stop or adjust your medicine on your own.  Stand up slowly after sitting or lying down. This allows your body to adjust to the different position.  Wear compression stockings as directed.  Eat extra salt as directed.  Do not add extra salt to your diet unless directed to by your health care provider.  Eat frequent, small meals.  Avoid standing suddenly after eating.  Avoid hot showers or excessive heat as directed by your health care provider.  Keep all follow-up appointments. SEEK MEDICAL CARE IF:  You continue to feel dizzy or light-headed after standing.  You feel groggy or confused.  You feel cold, clammy, or sick to your stomach (nauseous).  You have blurred vision.  You feel short of breath. SEEK IMMEDIATE MEDICAL CARE IF:   You faint after standing.  You have chest pain.  You have difficulty breathing.   You lose feeling or movement in your arms or legs.   You have  slurred speech or difficulty talking, or you are unable to talk.  MAKE SURE YOU:   Understand these instructions.  Will watch your condition.  Will get help right away if you are not doing well or get worse. Document Released: 03/05/2002 Document Revised: 03/20/2013 Document Reviewed: 01/05/2013 [facility] Patient Information 2015 [place], [place]. This information is not intended to replace  advice given to you by your health care provider. Make sure you discuss any questions you have with your health care provider.

## 2014-11-18 NOTE — ED Provider Notes (Signed)
CSN: [id number]     Arrival date & time 11/18/14  1236 History   First MD Initiated Contact with Patient 11/18/14 1301     Chief Complaint  Patient presents with  . Hypotension     (Consider location/radiation/quality/duration/timing/severity/associated sxs/prior Treatment) HPI 74 year old male with history of end-stage renal disease on hemodialysis, hypertension, hyperlipidemia, and prior [facility] who presents with hypotension. Patient has had ongoing issues with orthostatic hypotension associated with dialysis over the course of the past 2 months. He has been to the emergency department in the past for evaluation of syncope in the setting of orthostasis. He was recently seen 11/12/2014 for this, and also received MRI/MRA for evaluation of a vertebral basilar insufficiency, which overall was unremarkable. He has also been started on Midrin, to take prior to dialysis, which she has been compliant with. He also was recently increased on his dry weight from 89.5 kg to 90 kg. He otherwise went to dialysis today, and reports that he felt well. After dialysis, he was noted to feel lightheaded upon standing and had systolic blood pressure of 80s. EMS was called and gave 1 L of IV fluids. He was then transferred to the emergency department for further evaluation. The patient denies any syncope, chest pain, difficulty breathing, melena, hematochezia, recent fevers or chills, or any recent illnesses.   Past Medical History  Diagnosis Date  . End stage renal disease     hemodialysis 3 times a week  . Seasonal allergies   . Hyperlipidemia   . Anemia   . Depression   . Macular degeneration     both eyes  . GERD (gastroesophageal reflux disease)   . Hypertension   . CVA (cerebral infarction)     2004/affected left side  . Peptic ulcer     bleeding, 1969  . Kidney stones   . Renal insufficiency   . Diverticulosis   . Tubular adenoma of colon 07/2001  . Barrett's esophagus 05/2003  . Stroke 2004  .  Colon polyps   . Renal failure   . Hemodialysis patient   . Aortic stenosis 06/15/12    TEE - EF 37-85%; grade 1 diastolic dysfunction; mild/mod aortic valve stenosis; Mitral valve had calcified annulus, mild pulm htn PA peak pressure 29mmHg  . Atrial fibrillation 04/01/10    14 day event monitor - some sinus bradycardia, PACs and sinus tachycardia  . Atrial fibrillation 11/03/09    R/P MV - EF 66%; normal myocardial perfusion study, w/o chest pain or EKG changes for ischemia  . S/P epidural steroid injection     last  injection over 10 years ago  . Diabetes mellitus without complication    Past Surgical History  Procedure Laterality Date  . Av fistula placement  2009  . Laminectomy  1969  . Tonsillectomy  1964  . 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 Cystoscopy  several times    kidney stones  . Back surgery     Family History  Problem Relation Age of Onset  . Colon cancer Neg Hx   . Stomach cancer Mother   . Hypertension Father     Died of heart attack   Social History  Substance Use Topics  . Smoking status: Former Smoker    Quit date: 01/20/1998  . Smokeless tobacco: Never Used  . Alcohol Use: No    Review of Systems 10/14 systems reviewed and are negative other than those stated in the HPI   Allergies  Codeine; Penicillins; and Tramadol  Home Medications   Prior to Admission medications   Medication Sig Start Date End Date Taking? Authorizing Provider  acetaminophen (TYLENOL) 500 MG tablet Take 1,000 mg by mouth every 6 (six) hours as needed for pain.   Yes Historical Provider, MD  amiodarone (PACERONE) 200 MG tablet Take 2 tablets (400 mg total) by mouth daily. 07/04/14  Yes Pixie Casino, MD  atorvastatin (LIPITOR) 40 MG tablet Take 20 mg by mouth daily.   Yes Historical Provider, MD  b complex-vitamin c-folic acid (NEPHRO-VITE) 0.8 MG TABS Take 0.8 mg by mouth daily.    Yes Historical Provider, MD  bromocriptine (PARLODEL) 5 MG capsule Take 5 mg by mouth at  bedtime.  11/30/10  Yes Historical Provider, MD  calcium acetate (PHOSLO) 667 MG capsule Take 667 mg by mouth 3 (three) times daily with meals. Take 2 tablets each morning, 1 at mid-day and 1 each evening   Yes Historical Provider, MD  cetirizine (ZYRTEC) 10 MG tablet Take 10 mg by mouth daily.   Yes Historical Provider, MD  cinacalcet (SENSIPAR) 30 MG tablet Take 30 mg by mouth every other day.    Yes Historical Provider, MD  dorzolamide-timolol (COSOPT) 22.3-6.8 MG/ML ophthalmic solution Place 1 drop into the right eye 2 (two) times daily. 08/13/13  Yes Historical Provider, MD  FLUoxetine (PROZAC) 20 MG capsule Take 40 mg by mouth every morning.  11/30/10  Yes Historical Provider, MD  latanoprost (XALATAN) 0.005 % ophthalmic solution Place 1 drop into both eyes at bedtime. 02/13/13  Yes Historical Provider, MD  midodrine (PROAMATINE) 10 MG tablet Midodrine 10 mg - Take (1) ONE tablet (10 mg total) by mouth daily. 10/21/14  Yes Pixie Casino, MD  omeprazole (PRILOSEC) 20 MG capsule Take 1 capsule (20 mg total) by mouth 2 (two) times daily before a meal. 04/06/14  Yes Ripudeep K Rai, MD  warfarin (COUMADIN) 2.5 MG tablet Take 1.5 to 2 tablets by mouth daily as directed by coumadin clinic Patient taking differently: Take 3.75-5 mg by mouth daily. Takes 2 tablets (5 mg) on Mondays and Fridays.  Takes 1.5 tablets (3.75 mg) on all remaining days. 06/17/14  Yes Pixie Casino, MD  benzonatate (TESSALON) 200 MG capsule Take 1 capsule (200 mg total) by mouth 3 (three) times daily as needed for cough. Patient not taking: Reported on 06/27/2014 04/06/14   Ripudeep Krystal Eaton, MD  enoxaparin (LOVENOX) 30 MG/0.3ML injection Inject 0.3 mLs (30 mg total) into the skin daily. 10/04/14   Pixie Casino, MD  HYDROcodone-homatropine [facility]) 5-1.5 MG/5ML syrup Take 5 mLs by mouth every 6 (six) hours as needed for cough. Patient not taking: Reported on 06/27/2014 04/05/14   Ripudeep Krystal Eaton, MD  levalbuterol [facility] HFA) 45 MCG/ACT  inhaler Inhale 1 puff into the lungs every 8 (eight) hours as needed for wheezing. Patient not taking: Reported on 06/27/2014 04/06/14   Ripudeep Krystal Eaton, MD  loratadine (CLARITIN) 10 MG tablet Take 1 tablet (10 mg total) by mouth daily. Patient not taking: Reported on 06/27/2014 04/06/14   Ripudeep Krystal Eaton, MD  predniSONE (DELTASONE) 10 MG tablet Prednisone dosing: Take  Prednisone 40mg  (4 tabs) x 2 days, then taper to 30mg  (3 tabs) x 3 days, then 20mg  (2 tabs) x 3days, then 10mg  (1 tab) x 3days, then OFF.  Dispense:  26 tabs, refills: None Patient not taking: Reported on 06/27/2014 04/07/14   Ripudeep Krystal Eaton, MD  traMADol (ULTRAM) 50 MG tablet  Take 1 tablet (50 mg total) by mouth every 8 (eight) hours as needed for moderate pain. Patient not taking: Reported on 06/27/2014 04/06/14   Ripudeep K Rai, MD   BP 133/56 mmHg  Pulse 71  Temp(Src) 97.4 F (36.3 C) (Oral)  Resp 21  Ht 5\' 4"  (1.626 m)  Wt 214 lb 1.1 oz (97.1 kg)  BMI 36.73 kg/m2  SpO2 96% Physical Exam Physical Exam  Nursing note and vitals reviewed. Constitutional: Well developed, well nourished, non-toxic, and in no acute distress Head: Normocephalic and atraumatic.  Mouth/Throat: Oropharynx is clear and moist.  Neck: Normal range of motion. Neck supple.  Cardiovascular: Normal rate and regular rhythm.   Pulmonary/Chest: Effort normal and breath sounds normal.  Abdominal: Soft. There is no tenderness. There is no rebound and no guarding.  Musculoskeletal: Normal range of motion.  Neurological: Alert, no facial droop, fluent speech, moves all extremities symmetrically Skin: Skin is warm and dry.  Psychiatric: Cooperative   ED Course  Procedures (including critical care time) Labs Review Labs Reviewed  CBC WITH DIFFERENTIAL/PLATELET - Abnormal; Notable for the following:    RBC 2.99 (*)    Hemoglobin 10.4 (*)    HCT 30.4 (*)    MCV 101.7 (*)    MCH 34.8 (*)    RDW 16.5 (*)    All other components within normal limits   COMPREHENSIVE METABOLIC PANEL - Abnormal; Notable for the following:    Chloride 100 (*)    Creatinine, Ser 4.37 (*)    Calcium 8.7 (*)    Total Protein 6.2 (*)    Albumin 3.0 (*)    AST 42 (*)    ALT 65 (*)    GFR calc non Af Amer 12 (*)    GFR calc Af Amer 14 (*)    All other components within normal limits  PROTIME-INR - Abnormal; Notable for the following:    Prothrombin Time 24.0 (*)    INR 2.17 (*)    All other components within normal limits  APTT - Abnormal; Notable for the following:    aPTT 53 (*)    All other components within normal limits  MAGNESIUM  PHOSPHORUS  I-STAT CG4 LACTIC ACID, ED  I-STAT CG4 LACTIC ACID, ED    I have personally reviewed and evaluated these lab results as part of my medical decision-making.   EKG Interpretation   Date/Time:  Monday November 18 2014 12:54:20 EDT Ventricular Rate:  49 PR Interval:  256 QRS Duration: 111 QT Interval:  522 QTC Calculation: 471 R Axis:   -29 Text Interpretation:  Sinus bradycardia Prolonged PR interval Borderline  left axis deviation Baseline wander in lead(s) V5 No significant change  since last tracing Confirmed by LIU MD, DANA ([id number]) on 11/18/2014 12:56:41  PM      MDM   Final diagnoses:  Orthostatic hypotension    In short, this 74 year old male with history of end-stage renal disease on hemodialysis who presents with hypotension after dialysis. On arrival, he is well-appearing, nontoxic in no acute distress. Reports that he is asymptomatic currently. Orthostatics are performed at bedside, and he is no longer orthostatic after receiving 1 L of IV fluids prior to arrival. His weight here today is 91.7 kg after IVF. EKG is nonischemic, basic blood work overall is unremarkable. He has a normal lactic acid. Do not believe that this is consistent with underlying acute cardiopulmonary cause or infection. I discussed this patient with the nephrologist on-call, who given that patient  continues to have  issues with orthostatic hypotension, has arranged his dry weight to be increased 1 kg. He also recommended that patient start taking Midrin 2 times a day when he is getting dialysis, and once a day when he is not getting dialysis. Patient currently does not show signs of fluid overload, but does not require any additional fluids here in the ED. I discussed this plan of care with the patient and his family members, who felt comfortable with this. Strict return and follow-up instructions are reviewed. He expressed understanding of all discharge instructions and felt comfortable with the plan of care.     Forde Dandy, MD 11/18/14 [phone]

## 2014-11-18 NOTE — Telephone Encounter (Signed)
Left msg for pt to call. 

## 2014-11-18 NOTE — Telephone Encounter (Signed)
Thanks for the note - see previous telephone notes, I had recommended increasing midodrine to 20 mg up to TID as needed for hypotension. He should be holding any BP Meds on dialysis days - I believe that Dr. Lorrene Reid (nephrology) is aware of this.  Dr. Lemmie Evens

## 2014-11-18 NOTE — ED Notes (Addendum)
EMS - Patient coming from [facility] with c/o of hypotension.  Patient systolic was in the 15'X, was given 1000L bolus of fluids at the facility.  EMS vitals 146/69, HR 48 and 18 respirations.  Patient had a fall last week on Tuesday at home with associated hypotension.  Patient is alert and oriented.  Was noted that patient is still taking a generic of HCTZ Beta Blocker despite being on dialysis.  Dialysis Monday, Wednesday and Friday.

## 2014-11-18 NOTE — ED Notes (Signed)
IV Team notified that patient is still accessed from dialysis.  MD made aware.

## 2014-11-20 ENCOUNTER — Telehealth: Payer: Self-pay | Admitting: Internal Medicine

## 2014-11-20 DIAGNOSIS — E1129 Type 2 diabetes mellitus with other diabetic kidney complication: Secondary | ICD-10-CM | POA: Diagnosis not present

## 2014-11-20 DIAGNOSIS — N186 End stage renal disease: Secondary | ICD-10-CM | POA: Diagnosis not present

## 2014-11-20 DIAGNOSIS — D509 Iron deficiency anemia, unspecified: Secondary | ICD-10-CM | POA: Diagnosis not present

## 2014-11-20 DIAGNOSIS — D631 Anemia in chronic kidney disease: Secondary | ICD-10-CM | POA: Diagnosis not present

## 2014-11-20 DIAGNOSIS — N2581 Secondary hyperparathyroidism of renal origin: Secondary | ICD-10-CM | POA: Diagnosis not present

## 2014-11-20 DIAGNOSIS — I482 Chronic atrial fibrillation: Secondary | ICD-10-CM | POA: Diagnosis not present

## 2014-11-20 MED ORDER — MIDODRINE HCL 10 MG PO TABS: 20.0000 mg | ORAL_TABLET | Freq: Three times a day (TID) | ORAL | Status: DC

## 2014-11-20 NOTE — Telephone Encounter (Signed)
Have them try the 20 mg TID for now and follow-up with [name] on the 8th. I understand that this is frustrating, however, there are few good treatments for these low blood pressure issues, especially with dialysis. He should reach out to his nephrologist for ideas as well.  Dr. Lemmie Evens

## 2014-11-20 NOTE — Telephone Encounter (Signed)
Spoke to [name] & explained I would call Kiyoshi Schaab (patient contact) about this - she voiced understanding.

## 2014-11-20 NOTE — Telephone Encounter (Signed)
Dose recommendation discussed w/ patient, Rx sent to patient's preferred pharmacy. Pt to follow up w/ [name] on 8th. Encouraged patient to call for any concerns in meantime. He voiced thanks for the call, and understanding of recommended med dose changes.

## 2014-11-20 NOTE — Telephone Encounter (Signed)
Patient's wife given instruction to increase dose of midodrine - voiced understanding.  She is still very upset because of the frequency of falls and is concerned that these are not necessarily due to dialysis; patient having low BP issues and falls on non-dialysis days.  Reviewed medications, pt not currently on any BP meds or narcotics.  Has appt w/ [name] on Sept 8th - can we work in/does he need to be seen sooner?  Note [name] also has patient coming in for coumadin visit on 29th - will cc.

## 2014-11-20 NOTE — Telephone Encounter (Signed)
[name] is calling because Alexander [name] keeps falling ans have been seen by his Nephrologist and they seem to think it is more of a Cardiac. He has seen [name] , but is a [name] patient .Please call   Thanks

## 2014-11-22 DIAGNOSIS — E1129 Type 2 diabetes mellitus with other diabetic kidney complication: Secondary | ICD-10-CM | POA: Diagnosis not present

## 2014-11-22 DIAGNOSIS — N186 End stage renal disease: Secondary | ICD-10-CM | POA: Diagnosis not present

## 2014-11-22 DIAGNOSIS — N2581 Secondary hyperparathyroidism of renal origin: Secondary | ICD-10-CM | POA: Diagnosis not present

## 2014-11-22 DIAGNOSIS — D509 Iron deficiency anemia, unspecified: Secondary | ICD-10-CM | POA: Diagnosis not present

## 2014-11-22 DIAGNOSIS — D631 Anemia in chronic kidney disease: Secondary | ICD-10-CM | POA: Diagnosis not present

## 2014-11-25 ENCOUNTER — Ambulatory Visit (INDEPENDENT_AMBULATORY_CARE_PROVIDER_SITE_OTHER): Payer: Medicare Other | Admitting: Pharmacist Clinician (PhC)/ Clinical Pharmacy Specialist

## 2014-11-25 ENCOUNTER — Telehealth: Payer: Self-pay

## 2014-11-25 DIAGNOSIS — N2581 Secondary hyperparathyroidism of renal origin: Secondary | ICD-10-CM | POA: Diagnosis not present

## 2014-11-25 DIAGNOSIS — Z7901 Long term (current) use of anticoagulants: Secondary | ICD-10-CM | POA: Diagnosis not present

## 2014-11-25 DIAGNOSIS — I480 Paroxysmal atrial fibrillation: Secondary | ICD-10-CM

## 2014-11-25 DIAGNOSIS — I350 Nonrheumatic aortic (valve) stenosis: Secondary | ICD-10-CM

## 2014-11-25 DIAGNOSIS — E1129 Type 2 diabetes mellitus with other diabetic kidney complication: Secondary | ICD-10-CM | POA: Diagnosis not present

## 2014-11-25 DIAGNOSIS — N186 End stage renal disease: Secondary | ICD-10-CM | POA: Diagnosis not present

## 2014-11-25 DIAGNOSIS — D631 Anemia in chronic kidney disease: Secondary | ICD-10-CM | POA: Diagnosis not present

## 2014-11-25 DIAGNOSIS — D509 Iron deficiency anemia, unspecified: Secondary | ICD-10-CM | POA: Diagnosis not present

## 2014-11-25 LAB — POCT INR: INR: 3.6

## 2014-11-27 DIAGNOSIS — E1129 Type 2 diabetes mellitus with other diabetic kidney complication: Secondary | ICD-10-CM | POA: Diagnosis not present

## 2014-11-27 DIAGNOSIS — Z992 Dependence on renal dialysis: Secondary | ICD-10-CM | POA: Diagnosis not present

## 2014-11-27 DIAGNOSIS — D631 Anemia in chronic kidney disease: Secondary | ICD-10-CM | POA: Diagnosis not present

## 2014-11-27 DIAGNOSIS — D509 Iron deficiency anemia, unspecified: Secondary | ICD-10-CM | POA: Diagnosis not present

## 2014-11-27 DIAGNOSIS — N2581 Secondary hyperparathyroidism of renal origin: Secondary | ICD-10-CM | POA: Diagnosis not present

## 2014-11-27 DIAGNOSIS — N186 End stage renal disease: Secondary | ICD-10-CM | POA: Diagnosis not present

## 2014-11-27 NOTE — Telephone Encounter (Signed)
Echo scheduled for 11/28/14. Return office visit with Dr Oval Linsey 12/05/14.

## 2014-11-28 ENCOUNTER — Ambulatory Visit (HOSPITAL_COMMUNITY): Payer: Medicare Other | Attending: Cardiology

## 2014-11-28 ENCOUNTER — Other Ambulatory Visit: Payer: Self-pay

## 2014-11-28 DIAGNOSIS — Z992 Dependence on renal dialysis: Secondary | ICD-10-CM | POA: Diagnosis not present

## 2014-11-28 DIAGNOSIS — I340 Nonrheumatic mitral (valve) insufficiency: Secondary | ICD-10-CM | POA: Diagnosis not present

## 2014-11-28 DIAGNOSIS — I071 Rheumatic tricuspid insufficiency: Secondary | ICD-10-CM | POA: Diagnosis not present

## 2014-11-28 DIAGNOSIS — E785 Hyperlipidemia, unspecified: Secondary | ICD-10-CM | POA: Insufficient documentation

## 2014-11-28 DIAGNOSIS — I350 Nonrheumatic aortic (valve) stenosis: Secondary | ICD-10-CM | POA: Diagnosis not present

## 2014-11-28 DIAGNOSIS — E119 Type 2 diabetes mellitus without complications: Secondary | ICD-10-CM | POA: Diagnosis not present

## 2014-11-28 DIAGNOSIS — I371 Nonrheumatic pulmonary valve insufficiency: Secondary | ICD-10-CM | POA: Insufficient documentation

## 2014-11-28 DIAGNOSIS — N186 End stage renal disease: Secondary | ICD-10-CM | POA: Diagnosis not present

## 2014-11-29 DIAGNOSIS — D631 Anemia in chronic kidney disease: Secondary | ICD-10-CM | POA: Diagnosis not present

## 2014-11-29 DIAGNOSIS — E1129 Type 2 diabetes mellitus with other diabetic kidney complication: Secondary | ICD-10-CM | POA: Diagnosis not present

## 2014-11-29 DIAGNOSIS — N2581 Secondary hyperparathyroidism of renal origin: Secondary | ICD-10-CM | POA: Diagnosis not present

## 2014-11-29 DIAGNOSIS — N186 End stage renal disease: Secondary | ICD-10-CM | POA: Diagnosis not present

## 2014-11-29 DIAGNOSIS — D509 Iron deficiency anemia, unspecified: Secondary | ICD-10-CM | POA: Diagnosis not present

## 2014-12-02 DIAGNOSIS — D509 Iron deficiency anemia, unspecified: Secondary | ICD-10-CM | POA: Diagnosis not present

## 2014-12-02 DIAGNOSIS — E1129 Type 2 diabetes mellitus with other diabetic kidney complication: Secondary | ICD-10-CM | POA: Diagnosis not present

## 2014-12-02 DIAGNOSIS — N2581 Secondary hyperparathyroidism of renal origin: Secondary | ICD-10-CM | POA: Diagnosis not present

## 2014-12-02 DIAGNOSIS — D631 Anemia in chronic kidney disease: Secondary | ICD-10-CM | POA: Diagnosis not present

## 2014-12-02 DIAGNOSIS — N186 End stage renal disease: Secondary | ICD-10-CM | POA: Diagnosis not present

## 2014-12-03 DIAGNOSIS — M961 Postlaminectomy syndrome, not elsewhere classified: Secondary | ICD-10-CM | POA: Diagnosis not present

## 2014-12-03 DIAGNOSIS — M5137 Other intervertebral disc degeneration, lumbosacral region: Secondary | ICD-10-CM | POA: Diagnosis not present

## 2014-12-03 DIAGNOSIS — G894 Chronic pain syndrome: Secondary | ICD-10-CM | POA: Diagnosis not present

## 2014-12-03 DIAGNOSIS — M5127 Other intervertebral disc displacement, lumbosacral region: Secondary | ICD-10-CM | POA: Diagnosis not present

## 2014-12-03 DIAGNOSIS — M4806 Spinal stenosis, lumbar region: Secondary | ICD-10-CM | POA: Diagnosis not present

## 2014-12-03 DIAGNOSIS — M5417 Radiculopathy, lumbosacral region: Secondary | ICD-10-CM | POA: Diagnosis not present

## 2014-12-03 DIAGNOSIS — M47817 Spondylosis without myelopathy or radiculopathy, lumbosacral region: Secondary | ICD-10-CM | POA: Diagnosis not present

## 2014-12-03 DIAGNOSIS — M545 Low back pain: Secondary | ICD-10-CM | POA: Diagnosis not present

## 2014-12-04 DIAGNOSIS — D509 Iron deficiency anemia, unspecified: Secondary | ICD-10-CM | POA: Diagnosis not present

## 2014-12-04 DIAGNOSIS — N2581 Secondary hyperparathyroidism of renal origin: Secondary | ICD-10-CM | POA: Diagnosis not present

## 2014-12-04 DIAGNOSIS — D631 Anemia in chronic kidney disease: Secondary | ICD-10-CM | POA: Diagnosis not present

## 2014-12-04 DIAGNOSIS — E1129 Type 2 diabetes mellitus with other diabetic kidney complication: Secondary | ICD-10-CM | POA: Diagnosis not present

## 2014-12-04 DIAGNOSIS — N186 End stage renal disease: Secondary | ICD-10-CM | POA: Diagnosis not present

## 2014-12-04 NOTE — Progress Notes (Signed)
Cardiology Office Note   Date:  12/05/2014   ID:  [name], DOB December 06, 1940, MRN [id number]  PCP:  Alexander Pel, MD  Cardiologist:   Alexander Harness, MD   Chief Complaint  Patient presents with  . Follow-up  . Dizziness  . Edema    IN KNEES AND ANKLES DUE TO A RECENT FALL      History of Present Illness: Alexander [name] is a 74 y.o. malewith moderate aortic stenosis, ESRD on HD, diabetes mellitus, hyperlipidemia, carotid artery disease with complete occlusion of the L carotid, prior TIA and atrial fibrillation  who presents for an evaluation of his hypotension and aortic stenosis. Alexander [name] has been experiencing low blood pressures both on dialysis days and in between dialysis. He was started on Midrin which was ultimately titrated to 20 mg 3 times a day. His dry weight was increased by 1 pound on 8/31. He went for an echo on 9/1.  On that day his systolic blood pressure was 150. Since that time he has not had to take any more Midodrine. His blood pressures have been in the 130s after leaving hemodialysis and is not had any other episodes of hypotension between sessions. He continues to feel lethargic and as though he is drunk. However he does not ever drink any alcohol. Upon review of the chart his heart rate has been quite low at times. It is then into the 40s. Alexander [name] takes amiodarone for atrial fibrillation and has been in sinus rhythm.  He had several episodes of syncope and falls prior to raising his blood pressure. Since September 1 he has not had any further episodes like this.  [name] denies any chest pain or shortness of breath. Since increasing his dry weight he has developed some mild edema to the ankles. He denies orthopnea or PND.    Alexander [name] had a negative nuclear stress test in Augues2014.  Past Medical History  Diagnosis Date  . End stage renal disease     hemodialysis 3 times a week  . Seasonal allergies   . Hyperlipidemia   . Anemia   .  Depression   . Macular degeneration     both eyes  . GERD (gastroesophageal reflux disease)   . Hypertension   . CVA (cerebral infarction)     2004/affected left side  . Peptic ulcer     bleeding, 1969  . Kidney stones   . Renal insufficiency   . Diverticulosis   . Tubular adenoma of colon 07/2001  . Barrett's esophagus 05/2003  . Stroke 2004  . Colon polyps   . Renal failure   . Hemodialysis patient   . Aortic stenosis 06/15/12    TEE - EF 08-65%; grade 1 diastolic dysfunction; mild/mod aortic valve stenosis; Mitral valve had calcified annulus, mild pulm htn PA peak pressure 48mmHg  . Atrial fibrillation 04/01/10    14 day event monitor - some sinus bradycardia, PACs and sinus tachycardia  . Atrial fibrillation 11/03/09    R/P MV - EF 66%; normal myocardial perfusion study, w/o chest pain or EKG changes for ischemia  . S/P epidural steroid injection     last  injection over 10 years ago  . Diabetes mellitus without complication     Past Surgical History  Procedure Laterality Date  . Av fistula placement  2009  . Laminectomy  1969  . Tonsillectomy  1964  . 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  Cystoscopy  several times    kidney stones  . Back surgery       Current Outpatient Prescriptions  Medication Sig Dispense Refill  . acetaminophen (TYLENOL) 500 MG tablet Take 1,000 mg by mouth every 6 (six) hours as needed for pain.    Alexander [name] amiodarone (PACERONE) 200 MG tablet Take 2 tablets (400 mg total) by mouth daily. 180 tablet 2  . atorvastatin (LIPITOR) 40 MG tablet Take 20 mg by mouth daily.    Alexander [name] b complex-vitamin c-folic acid (NEPHRO-VITE) 0.8 MG TABS Take 0.8 mg by mouth daily.     . benzonatate (TESSALON) 200 MG capsule Take 1 capsule (200 mg total) by mouth 3 (three) times daily as needed for cough. 60 capsule 0  . bromocriptine (PARLODEL) 5 MG capsule Take 5 mg by mouth at bedtime.     . calcium acetate (PHOSLO) 667 MG capsule Take 667 mg by mouth 3 (three) times daily  with meals. Take 2 tablets each morning, 1 at mid-day and 1 each evening    . cetirizine (ZYRTEC) 10 MG tablet Take 10 mg by mouth daily.    . cinacalcet (SENSIPAR) 30 MG tablet Take 30 mg by mouth every other day.     . dorzolamide-timolol (COSOPT) 22.3-6.8 MG/ML ophthalmic solution Place 1 drop into the right eye 2 (two) times daily.    Alexander [name] enoxaparin (LOVENOX) 30 MG/0.3ML injection Inject 0.3 mLs (30 mg total) into the skin daily. 10 Syringe 0  . FLUoxetine (PROZAC) 20 MG capsule Take 40 mg by mouth 2 (two) times daily. TAKE 2 TABLETS IN THE AM.    . latanoprost (XALATAN) 0.005 % ophthalmic solution Place 1 drop into both eyes at bedtime.    . levalbuterol (XOPENEX HFA) 45 MCG/ACT inhaler Inhale 1 puff into the lungs every 8 (eight) hours as needed for wheezing. 1 Inhaler 2  . loratadine (CLARITIN) 10 MG tablet Take 1 tablet (10 mg total) by mouth daily. 30 tablet 2  . midodrine (PROAMATINE) 10 MG tablet Take 2 tablets (20 mg total) by mouth 3 (three) times daily. 180 tablet 5  . omeprazole (PRILOSEC) 20 MG capsule Take 1 capsule (20 mg total) by mouth 2 (two) times daily before a meal. 60 capsule 3  . predniSONE (DELTASONE) 10 MG tablet Prednisone dosing: Take  Prednisone 40mg  (4 tabs) x 2 days, then taper to 30mg  (3 tabs) x 3 days, then 20mg  (2 tabs) x 3days, then 10mg  (1 tab) x 3days, then OFF.  Dispense:  26 tabs, refills: None 26 tablet 0  . warfarin (COUMADIN) 2.5 MG tablet Take 1.5 to 2 tablets by mouth daily as directed by coumadin clinic (Patient taking differently: Take 3.75-5 mg by mouth daily. Takes 2 tablets (5 mg) on Mondays and Fridays.  Takes 1.5 tablets (3.75 mg) on all remaining days.) 50 tablet 4   No current facility-administered medications for this visit.    Allergies:   Codeine; Penicillins; and Tramadol    Social History:  The patient  reports that he quit smoking about 16 years ago. He has never used smokeless tobacco. He reports that he does not drink alcohol or use  illicit drugs.   Family History:  The patient's family history includes Hypertension in his father; Stomach cancer in his mother. There is no history of Colon cancer.    ROS:  Please see the history of present illness.   Otherwise, review of systems are positive for none.   All other systems are reviewed and  negative.    PHYSICAL EXAM: VS:  BP 130/68 mmHg  Pulse 53  Ht 5\' 10"  (1.778 m)  Wt 92.987 kg (205 lb)  BMI 29.41 kg/m2 , BMI Body mass index is 29.41 kg/(m^2). GENERAL:  Well appearing HEENT:  Pupils equal round and reactive, fundi not visualized, oral mucosa unremarkable NECK:  No jugular venous distention, waveform within normal limits, carotid upstroke brisk and symmetric, no bruits, no thyromegaly LYMPHATICS:  No cervical adenopathy LUNGS:  Clear to auscultation bilaterally HEART:  RRR.  PMI not displaced or sustained,S1 and S2 within normal limits, no S3, no S4, no clicks, no rubs, III/VI mid-peaking crescendo-decrescendo murmur at the LUSB. ABD:  Flat, positive bowel sounds normal in frequency in pitch, no bruits, no rebound, no guarding, no midline pulsatile mass, no hepatomegaly, no splenomegaly EXT:  2 plus pulses throughout, no edema, no cyanosis no clubbing SKIN:  No rashes no nodules NEURO:  Cranial nerves II through XII grossly intact, motor grossly intact throughout PSYCH:  Cognitively intact, oriented to person place and time    EKG:  EKG is not ordered today. The ekg from 11/19/14 demonstrates sinus bradycardia at 49 bpm. First degree heart block.   11/28/14 TTE: Study Conclusions  - Left ventricle: The cavity size was normal. Systolic function was normal. The estimated ejection fraction was in the range of 60% to 65%. Wall motion was normal; there were no regional wall motion abnormalities. Features are consistent with a pseudonormal left ventricular filling pattern, with concomitant abnormal relaxation and increased filling pressure (grade 2  diastolic dysfunction). Doppler parameters are consistent with high ventricular filling pressure. - Aortic valve: Severe diffuse thickening and calcification. There was moderate stenosis.  VTI ratio of LVOT to aortic valve: 0.31. Valve area (VTI): 1.27 cm^2. Indexed valve area (VTI): 0.59 cm^2/m^2. Mean velocity ratio of LVOT to aortic valve: 0.31. Valve area (Vmean): 1.29 cm^2. Indexed valve area (Vmean): 0.6 cm^2/m^2.  Mean gradient (S): 22 mm Hg. - Mitral valve: Severely calcified annulus. There was mild regurgitation. - Left atrium: The atrium was mildly dilated. - Right ventricle: The cavity size was mildly dilated. Wall thickness was normal. - Tricuspid valve: There was trivial regurgitation. - Pulmonic valve: There was trivial regurgitation. - Pulmonary arteries: PA peak pressure: 37 mm Hg (S).   Recent Labs: 04/04/2014: B Natriuretic Peptide 571.9* 07/05/2014: TSH 2.069 11/18/2014: ALT 65*; BUN 12; Creatinine, Ser 4.37*; Hemoglobin 10.4*; Magnesium 1.9; Platelets 198; Potassium 3.5; Sodium 139    Lipid Panel    Component Value Date/Time   CHOL 104 04/04/2014 0843   TRIG 68 04/04/2014 0843   HDL 35* 04/04/2014 0843   CHOLHDL 3.0 04/04/2014 0843   VLDL 14 04/04/2014 0843   LDLCALC 55 04/04/2014 0843      Wt Readings from Last 3 Encounters:  12/05/14 92.987 kg (205 lb)  11/18/14 97.1 kg (214 lb 1.1 oz)  04/16/14 93.713 kg (206 lb 9.6 oz)      Other studies Reviewed: Additional studies/ records that were reviewed today include: [facility] records Review of the above records demonstrates:  Please see elsewhere in the note.     ASSESSMENT AND PLAN:  # Hypotension: Fortunately Mr. Cervenka hypotension has improved by increasing his dry weight. He is no longer needing midodrine.  We will leave it on his medication list but only as needed and reduce the dose to 10 mg 3 times a day. He will continue to check his blood pressure at home regularly and also has blood  pressure checked several times during each dialysis session 3 times per week.   # Bradycardia/atrial fibrillation: On review it looks as though Mr. Cornelio has several episodes of documented bradycardia. It's unclear how much this may have contributed to his syncope and hypotension. He does continue to complain of fatigue and lightheadedness. This certainly could be due to the bradycardia. He was symptomatic in atrial fibrillation and amiodarone has worked well to keep him in sinus rhythm, though its beta blocker effect may also be decreasing his heart rate. His options for antiarrhythmics are limited due to his end-stage renal disease and his age. We will refer him to EP for consideration of an alternative antiarrhythmic versus implantable pacemaker versus ablation. He is to continue on warfarin has managed in our anticoagulation clinic.    # Moderate aortic stenosis: Mr. Hevener aortic stenosis is in the moderate rate.  It is unlikely that this is causing his symptoms, though it certainly doesn't help. For now we will continue to monitor it.    Current medicines are reviewed at length with the patient today.  The patient does not have concerns regarding medicines.  The following changes have been made:  Midodrine prn  Labs/ tests ordered today include:  No orders of the defined types were placed in this encounter.     Disposition:   FU with Dr. Jonelle Sidle C. [place] in 6 months.  EP consult.    Signed, Alexander Harness, MD  12/05/2014 11:21 AM    [place]

## 2014-12-05 ENCOUNTER — Ambulatory Visit (INDEPENDENT_AMBULATORY_CARE_PROVIDER_SITE_OTHER): Payer: Medicare Other | Admitting: Pharmacist Clinician (PhC)/ Clinical Pharmacy Specialist

## 2014-12-05 ENCOUNTER — Telehealth: Payer: Self-pay | Admitting: *Deleted

## 2014-12-05 ENCOUNTER — Encounter: Payer: Self-pay | Admitting: Cardiovascular Disease

## 2014-12-05 ENCOUNTER — Ambulatory Visit (INDEPENDENT_AMBULATORY_CARE_PROVIDER_SITE_OTHER): Payer: Medicare Other | Admitting: Cardiovascular Disease

## 2014-12-05 VITALS — BP 130/68 | HR 53 | Ht 70.0 in | Wt 205.0 lb

## 2014-12-05 DIAGNOSIS — Z7901 Long term (current) use of anticoagulants: Secondary | ICD-10-CM

## 2014-12-05 DIAGNOSIS — R001 Bradycardia, unspecified: Secondary | ICD-10-CM

## 2014-12-05 DIAGNOSIS — I480 Paroxysmal atrial fibrillation: Secondary | ICD-10-CM

## 2014-12-05 LAB — POCT INR: INR: 2.1

## 2014-12-05 MED ORDER — MIDODRINE HCL 10 MG PO TABS
10.0000 mg | ORAL_TABLET | Freq: Three times a day (TID) | ORAL | Status: DC | PRN
Start: 2014-12-05 — End: 2015-01-17

## 2014-12-05 NOTE — Patient Instructions (Addendum)
Dr Oval Linsey has recommended making the following medication changes: Midodrine - take ONE (1) TABLET (10 mg total) by mouth three times daily AS NEEDED  Your physician has recommended that you wear a holter monitor. Holter monitors are medical devices that record the heart's electrical activity. Doctors most often use these monitors to diagnose arrhythmias. Arrhythmias are problems with the speed or rhythm of the heartbeat. The monitor is a small, portable device. You can wear one while you do your normal daily activities. This is usually used to diagnose what is causing palpitations/syncope (passing out). This will be scheduled to be placed at our [facility] location [place].  Dr Oval Linsey recommends that you schedule a follow-up appointment in 3 months.  **Dr Oval Linsey has referred you to Dr Allegra Lai at our [facility] location.

## 2014-12-05 NOTE — Telephone Encounter (Signed)
.  left message -please disregard this message patient saw doctor today

## 2014-12-05 NOTE — Telephone Encounter (Signed)
-----   Message from Skeet Latch, MD sent at 11/29/2014  5:55 PM EDT ----- Normal squeezing function but the heart does not relax well.  There is some thickening of the aortic valve and moderate aortic stenosis.

## 2014-12-06 DIAGNOSIS — E1129 Type 2 diabetes mellitus with other diabetic kidney complication: Secondary | ICD-10-CM | POA: Diagnosis not present

## 2014-12-06 DIAGNOSIS — N186 End stage renal disease: Secondary | ICD-10-CM | POA: Diagnosis not present

## 2014-12-06 DIAGNOSIS — D509 Iron deficiency anemia, unspecified: Secondary | ICD-10-CM | POA: Diagnosis not present

## 2014-12-06 DIAGNOSIS — D631 Anemia in chronic kidney disease: Secondary | ICD-10-CM | POA: Diagnosis not present

## 2014-12-06 DIAGNOSIS — N2581 Secondary hyperparathyroidism of renal origin: Secondary | ICD-10-CM | POA: Diagnosis not present

## 2014-12-09 ENCOUNTER — Ambulatory Visit (INDEPENDENT_AMBULATORY_CARE_PROVIDER_SITE_OTHER): Payer: Medicare Other

## 2014-12-09 ENCOUNTER — Other Ambulatory Visit: Payer: Self-pay | Admitting: Cardiovascular Disease

## 2014-12-09 DIAGNOSIS — R001 Bradycardia, unspecified: Secondary | ICD-10-CM | POA: Diagnosis not present

## 2014-12-09 DIAGNOSIS — E1129 Type 2 diabetes mellitus with other diabetic kidney complication: Secondary | ICD-10-CM | POA: Diagnosis not present

## 2014-12-09 DIAGNOSIS — D631 Anemia in chronic kidney disease: Secondary | ICD-10-CM | POA: Diagnosis not present

## 2014-12-09 DIAGNOSIS — D509 Iron deficiency anemia, unspecified: Secondary | ICD-10-CM | POA: Diagnosis not present

## 2014-12-09 DIAGNOSIS — N2581 Secondary hyperparathyroidism of renal origin: Secondary | ICD-10-CM | POA: Diagnosis not present

## 2014-12-09 DIAGNOSIS — N186 End stage renal disease: Secondary | ICD-10-CM | POA: Diagnosis not present

## 2014-12-11 DIAGNOSIS — H01025 Squamous blepharitis left lower eyelid: Secondary | ICD-10-CM | POA: Diagnosis not present

## 2014-12-11 DIAGNOSIS — N2581 Secondary hyperparathyroidism of renal origin: Secondary | ICD-10-CM | POA: Diagnosis not present

## 2014-12-11 DIAGNOSIS — D631 Anemia in chronic kidney disease: Secondary | ICD-10-CM | POA: Diagnosis not present

## 2014-12-11 DIAGNOSIS — N186 End stage renal disease: Secondary | ICD-10-CM | POA: Diagnosis not present

## 2014-12-11 DIAGNOSIS — H01024 Squamous blepharitis left upper eyelid: Secondary | ICD-10-CM | POA: Diagnosis not present

## 2014-12-11 DIAGNOSIS — H01021 Squamous blepharitis right upper eyelid: Secondary | ICD-10-CM | POA: Diagnosis not present

## 2014-12-11 DIAGNOSIS — Z961 Presence of intraocular lens: Secondary | ICD-10-CM | POA: Diagnosis not present

## 2014-12-11 DIAGNOSIS — D509 Iron deficiency anemia, unspecified: Secondary | ICD-10-CM | POA: Diagnosis not present

## 2014-12-11 DIAGNOSIS — H11121 Conjunctival concretions, right eye: Secondary | ICD-10-CM | POA: Diagnosis not present

## 2014-12-11 DIAGNOSIS — E1129 Type 2 diabetes mellitus with other diabetic kidney complication: Secondary | ICD-10-CM | POA: Diagnosis not present

## 2014-12-11 DIAGNOSIS — H16211 Exposure keratoconjunctivitis, right eye: Secondary | ICD-10-CM | POA: Diagnosis not present

## 2014-12-11 DIAGNOSIS — H01022 Squamous blepharitis right lower eyelid: Secondary | ICD-10-CM | POA: Diagnosis not present

## 2014-12-13 DIAGNOSIS — N2581 Secondary hyperparathyroidism of renal origin: Secondary | ICD-10-CM | POA: Diagnosis not present

## 2014-12-13 DIAGNOSIS — D631 Anemia in chronic kidney disease: Secondary | ICD-10-CM | POA: Diagnosis not present

## 2014-12-13 DIAGNOSIS — D509 Iron deficiency anemia, unspecified: Secondary | ICD-10-CM | POA: Diagnosis not present

## 2014-12-13 DIAGNOSIS — N186 End stage renal disease: Secondary | ICD-10-CM | POA: Diagnosis not present

## 2014-12-13 DIAGNOSIS — E1129 Type 2 diabetes mellitus with other diabetic kidney complication: Secondary | ICD-10-CM | POA: Diagnosis not present

## 2014-12-16 ENCOUNTER — Ambulatory Visit (INDEPENDENT_AMBULATORY_CARE_PROVIDER_SITE_OTHER): Payer: Medicare Other | Admitting: Pharmacist Clinician (PhC)/ Clinical Pharmacy Specialist

## 2014-12-16 DIAGNOSIS — N186 End stage renal disease: Secondary | ICD-10-CM | POA: Diagnosis not present

## 2014-12-16 DIAGNOSIS — Z7901 Long term (current) use of anticoagulants: Secondary | ICD-10-CM | POA: Diagnosis not present

## 2014-12-16 DIAGNOSIS — N2581 Secondary hyperparathyroidism of renal origin: Secondary | ICD-10-CM | POA: Diagnosis not present

## 2014-12-16 DIAGNOSIS — E1129 Type 2 diabetes mellitus with other diabetic kidney complication: Secondary | ICD-10-CM | POA: Diagnosis not present

## 2014-12-16 DIAGNOSIS — I480 Paroxysmal atrial fibrillation: Secondary | ICD-10-CM | POA: Diagnosis not present

## 2014-12-16 DIAGNOSIS — D631 Anemia in chronic kidney disease: Secondary | ICD-10-CM | POA: Diagnosis not present

## 2014-12-16 DIAGNOSIS — D509 Iron deficiency anemia, unspecified: Secondary | ICD-10-CM | POA: Diagnosis not present

## 2014-12-16 LAB — POCT INR: INR: 4.3

## 2014-12-17 ENCOUNTER — Telehealth: Payer: Self-pay | Admitting: *Deleted

## 2014-12-17 NOTE — Telephone Encounter (Signed)
SPOKE TO WIFE RESULTS GIVEN AND TO KEEP APPOINTMENT WITH EP FOR TOMORROW  SHE VERBALIZED UNDERSTANDING.

## 2014-12-17 NOTE — Telephone Encounter (Signed)
-----   Message from Skeet Latch, MD sent at 12/13/2014 10:09 PM EDT ----- Holter showed some slow heart rates.  Keep appointment with EP for consideration of pacemaker and management of atrial fibrillation.

## 2014-12-18 ENCOUNTER — Encounter: Payer: Self-pay | Admitting: Cardiology

## 2014-12-18 ENCOUNTER — Ambulatory Visit (INDEPENDENT_AMBULATORY_CARE_PROVIDER_SITE_OTHER): Payer: Medicare Other | Admitting: Cardiology

## 2014-12-18 ENCOUNTER — Other Ambulatory Visit: Payer: Self-pay | Admitting: Cardiology

## 2014-12-18 VITALS — BP 152/76 | HR 49 | Ht 70.5 in | Wt 201.2 lb

## 2014-12-18 DIAGNOSIS — N2581 Secondary hyperparathyroidism of renal origin: Secondary | ICD-10-CM | POA: Diagnosis not present

## 2014-12-18 DIAGNOSIS — N186 End stage renal disease: Secondary | ICD-10-CM | POA: Diagnosis not present

## 2014-12-18 DIAGNOSIS — I482 Chronic atrial fibrillation: Secondary | ICD-10-CM | POA: Diagnosis not present

## 2014-12-18 DIAGNOSIS — D631 Anemia in chronic kidney disease: Secondary | ICD-10-CM | POA: Diagnosis not present

## 2014-12-18 DIAGNOSIS — D509 Iron deficiency anemia, unspecified: Secondary | ICD-10-CM | POA: Diagnosis not present

## 2014-12-18 DIAGNOSIS — I480 Paroxysmal atrial fibrillation: Secondary | ICD-10-CM

## 2014-12-18 DIAGNOSIS — E1129 Type 2 diabetes mellitus with other diabetic kidney complication: Secondary | ICD-10-CM | POA: Diagnosis not present

## 2014-12-18 DIAGNOSIS — I4891 Unspecified atrial fibrillation: Secondary | ICD-10-CM | POA: Diagnosis not present

## 2014-12-18 NOTE — Addendum Note (Signed)
Addended by: Stanton Kidney on: 12/18/2014 04:59 PM   Modules accepted: Orders, Medications

## 2014-12-18 NOTE — Progress Notes (Signed)
Electrophysiology Office Note   Date:  12/18/2014   ID:  Alexander [name], DOB 09/14/1940, MRN [id number]  PCP:  Horatio Pel, MD  Cardiologist:  Jacinto Reap Primary Electrophysiologist:  Will Meredith [name], MD    Chief Complaint  Patient presents with  . Bradycardia     History of Present Illness: Alexander [name] is a 74 y.o. male who presents today for electrophysiology evaluation.   He has a history of moderate aortic stenosis, ESRD on HD, diabetes mellitus, hyperlipidemia, carotid artery disease with complete occlusion of the L carotid, prior TIA and atrial fibrillation.  He was having issues with hypotension but his dry weight was increased and he has not been having issues with BP.  He continues to feel lethargic and has had HR in the 40s.  He is on amiodarone for AF and has been in SR.  When his BP was low, he was having syncope with falls but none since Sept 1.     Today, he denies symptoms of palpitations, chest pain, shortness of breath, orthopnea, PND, lower extremity edema, claudication bleeding, or neurologic sequela. He has been having fatigue and presyncope.  He says that the presyncope comes suddenly without warning.  He has not passed out since his dry weight has changed, but has had continued symptoms.  Sometime it seems to be associated with dialysis, but other times it can occur on non HD days   Past Medical History  Diagnosis Date  . End stage renal disease     hemodialysis 3 times a week  . Seasonal allergies   . Hyperlipidemia   . Anemia   . Depression   . Macular degeneration     both eyes  . GERD (gastroesophageal reflux disease)   . Hypertension   . CVA (cerebral infarction)     2004/affected left side  . Peptic ulcer     bleeding, 1969  . Kidney stones   . Renal insufficiency   . Diverticulosis   . Tubular adenoma of colon 07/2001  . Barrett's esophagus 05/2003  . Stroke 2004  . Colon polyps   . Renal failure   . Hemodialysis  patient   . Aortic stenosis 06/15/12    TEE - EF 07-62%; grade 1 diastolic dysfunction; mild/mod aortic valve stenosis; Mitral valve had calcified annulus, mild pulm htn PA peak pressure 59mmHg  . Atrial fibrillation 04/01/10    14 day event monitor - some sinus bradycardia, PACs and sinus tachycardia  . Atrial fibrillation 11/03/09    R/P MV - EF 66%; normal myocardial perfusion study, w/o chest pain or EKG changes for ischemia  . S/P epidural steroid injection     last  injection over 10 years ago  . Diabetes mellitus without complication    Past Surgical History  Procedure Laterality Date  . Av fistula placement  2009  . Laminectomy  1969  . Tonsillectomy  1964  . 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 Cystoscopy  several times    kidney stones  . Back surgery       Current Outpatient Prescriptions  Medication Sig Dispense Refill  . acetaminophen (TYLENOL) 500 MG tablet Take 1,000 mg by mouth every 6 (six) hours as needed for pain.    [name] amiodarone (PACERONE) 200 MG tablet Take 2 tablets (400 mg total) by mouth daily. 180 tablet 2  . atorvastatin (LIPITOR) 40 MG tablet Take 20 mg by mouth daily.    [name] b  complex-vitamin c-folic acid (NEPHRO-VITE) 0.8 MG TABS Take 0.8 mg by mouth daily.     . benzonatate (TESSALON) 200 MG capsule Take 1 capsule (200 mg total) by mouth 3 (three) times daily as needed for cough. 60 capsule 0  . bromocriptine (PARLODEL) 5 MG capsule Take 5 mg by mouth at bedtime.     . calcium acetate (PHOSLO) 667 MG capsule Take 667 mg by mouth 3 (three) times daily with meals. Take 2 tablets each morning, 1 at mid-day and 1 each evening    . cetirizine (ZYRTEC) 10 MG tablet Take 10 mg by mouth daily.    . cinacalcet (SENSIPAR) 30 MG tablet Take 30 mg by mouth every other day.     . cromolyn (OPTICROM) 4 % ophthalmic solution Place 1 drop into both eyes 4 (four) times daily.  6  . dorzolamide-timolol (COSOPT) 22.3-6.8 MG/ML ophthalmic solution Place 1 drop into the  right eye 2 (two) times daily.    [name] FLUoxetine (PROZAC) 20 MG capsule Take 40 mg by mouth 2 (two) times daily. TAKE 2 TABLETS IN THE AM.    . midodrine (PROAMATINE) 10 MG tablet Take 1 tablet (10 mg total) by mouth 3 (three) times daily as needed. 90 tablet 5  . omeprazole (PRILOSEC) 20 MG capsule Take 1 capsule (20 mg total) by mouth 2 (two) times daily before a meal. 60 capsule 3  . warfarin (COUMADIN) 2.5 MG tablet Take 1.5 to 2 tablets by mouth daily as directed by coumadin clinic (Patient taking differently: Take 3.75-5 mg by mouth daily. Takes 2 tablets (5 mg) on Mondays and Fridays.  Takes 1.5 tablets (3.75 mg) on all remaining days.) 50 tablet 4   No current facility-administered medications for this visit.    Allergies:   Codeine; Penicillins; and Tramadol   Social History:  The patient  reports that he quit smoking about 16 years ago. He has never used smokeless tobacco. He reports that he does not drink alcohol or use illicit drugs.   Family History:  The patient's family history includes Hypertension in his father; Stomach cancer in his mother. There is no history of Colon cancer.    ROS:  Please see the history of present illness.   Otherwise, review of systems is positive for presyncope, falls, syncope, fatigue.   All other systems are reviewed and negative.    PHYSICAL EXAM: VS:  BP 152/76 mmHg  Pulse 49  Ht 5' 10.5" (1.791 m)  Wt 201 lb 3.2 oz (91.264 kg)  BMI 28.45 kg/m2 , BMI Body mass index is 28.45 kg/(m^2). GEN: Well nourished, well developed, in no acute distress HEENT: normal Neck: no JVD, carotid bruits, or masses Cardiac: RRR; no murmurs, rubs, or gallops,no edema  Respiratory:  clear to auscultation bilaterally, normal work of breathing GI: soft, nontender, nondistended, + BS MS: no deformity or atrophy Skin: warm and dry, fistula left forearm Neuro:  Strength and sensation are intact Psych: euthymic mood, full affect  EKG:  EKG is ordered today. The ekg  ordered today shows sinus bradycardia rate 49     Recent Labs: 04/04/2014: B Natriuretic Peptide 571.9* 07/05/2014: TSH 2.069 11/18/2014: ALT 65*; BUN 12; Creatinine, Ser 4.37*; Hemoglobin 10.4*; Magnesium 1.9; Platelets 198; Potassium 3.5; Sodium 139    Lipid Panel     Component Value Date/Time   CHOL 104 04/04/2014 0843   TRIG 68 04/04/2014 0843   HDL 35* 04/04/2014 0843   CHOLHDL 3.0 04/04/2014 0843   VLDL 14  04/04/2014 0843   LDLCALC 55 04/04/2014 0843     Wt Readings from Last 3 Encounters:  12/18/14 201 lb 3.2 oz (91.264 kg)  12/05/14 205 lb (92.987 kg)  11/18/14 214 lb 1.1 oz (97.1 kg)      11/28/14 TTE: Study Conclusions  - Left ventricle: The cavity size was normal. Systolic function was normal. The estimated ejection fraction was in the range of 60% to 65%. Wall motion was normal; there were no regional wall motion abnormalities. Features are consistent with a pseudonormal left ventricular filling pattern, with concomitant abnormal relaxation and increased filling pressure (grade 2 diastolic dysfunction). Doppler parameters are consistent with high ventricular filling pressure. - Aortic valve: Severe diffuse thickening and calcification. There was moderate stenosis. VTI ratio of LVOT to aortic valve: 0.31. Valve area (VTI): 1.27 cm^2. Indexed valve area (VTI): 0.59 cm^2/m^2. Mean velocity ratio of LVOT to aortic valve: 0.31. Valve area (Vmean): 1.29 cm^2. Indexed valve area (Vmean): 0.6 cm^2/m^2.  Mean gradient (S): 22 mm Hg. - Mitral valve: Severely calcified annulus. There was mild regurgitation. - Left atrium: The atrium was mildly dilated. - Right ventricle: The cavity size was mildly dilated. Wall thickness was normal. - Tricuspid valve: There was trivial regurgitation. - Pulmonic valve: There was trivial regurgitation. - Pulmonary arteries: PA peak pressure: 37 mm Hg (S).   ASSESSMENT AND PLAN:  1.  Atrial fibrillation: on  amiodarone and warfarin.  Has CHADS2VASc of 4.  It is possible that amiodarone is causing his symptoms of fatigue and presyncope, but his ECG has normal intervals on the medication without evidence of significant bradycardia or heart block.  That being said, will get an amiodarone level and hold amiodarone for a short time to see if that makes a difference.  He may need a 30 day monitor or pacing to improve his symptoms but would prefer to avoid pacemaker placement as he is a dialysis patient.    Current medicines are reviewed at length with the patient today.   The patient has concerns regarding his medicines.  The following changes were made today:  Hold amiodarone, get amiodarone level.  Labs/ tests ordered today include: amiodarone level  No orders of the defined types were placed in this encounter.     Disposition:   FU with Will Meredith [name]  3 months  Signed, Will Meredith [name], MD  12/18/2014 3:58 PM     [place] [address] [place] [address] [phone] (office) [phone] (fax)

## 2014-12-18 NOTE — Patient Instructions (Signed)
Medication Instructions:  1) STOP Amiodarone  Labwork: Amiodarone level today  Testing/Procedures: None ordered  Follow-Up: Your physician recommends that you schedule a follow-up appointment in: 3 months with Dr. Curt Bears   Any Other Special Instructions Will Be Listed Below (If Applicable). Thank you for choosing [facility]!!   Trinidad Curet, RN [phone]

## 2014-12-19 LAB — PROTIME-INR: INR: 2.6 — AB (ref 0.9–1.1)

## 2014-12-19 LAB — TSH: TSH: 1.368 u[IU]/mL (ref 0.350–4.500)

## 2014-12-20 DIAGNOSIS — E1129 Type 2 diabetes mellitus with other diabetic kidney complication: Secondary | ICD-10-CM | POA: Diagnosis not present

## 2014-12-20 DIAGNOSIS — D631 Anemia in chronic kidney disease: Secondary | ICD-10-CM | POA: Diagnosis not present

## 2014-12-20 DIAGNOSIS — D509 Iron deficiency anemia, unspecified: Secondary | ICD-10-CM | POA: Diagnosis not present

## 2014-12-20 DIAGNOSIS — N2581 Secondary hyperparathyroidism of renal origin: Secondary | ICD-10-CM | POA: Diagnosis not present

## 2014-12-20 DIAGNOSIS — N186 End stage renal disease: Secondary | ICD-10-CM | POA: Diagnosis not present

## 2014-12-23 ENCOUNTER — Telehealth: Payer: Self-pay | Admitting: Cardiology

## 2014-12-23 DIAGNOSIS — D631 Anemia in chronic kidney disease: Secondary | ICD-10-CM | POA: Diagnosis not present

## 2014-12-23 DIAGNOSIS — N186 End stage renal disease: Secondary | ICD-10-CM | POA: Diagnosis not present

## 2014-12-23 DIAGNOSIS — N2581 Secondary hyperparathyroidism of renal origin: Secondary | ICD-10-CM | POA: Diagnosis not present

## 2014-12-23 DIAGNOSIS — D509 Iron deficiency anemia, unspecified: Secondary | ICD-10-CM | POA: Diagnosis not present

## 2014-12-23 DIAGNOSIS — E1129 Type 2 diabetes mellitus with other diabetic kidney complication: Secondary | ICD-10-CM | POA: Diagnosis not present

## 2014-12-23 LAB — AMIODARONE LEVEL
Amiodarone Lvl: 1.8 ug/mL (ref 1.5–2.5)
Desethylamiodarone: 1.2 ug/mL — ABNORMAL LOW (ref 1.5–2.5)

## 2014-12-23 LAB — HEPATIC FUNCTION PANEL

## 2014-12-23 NOTE — Telephone Encounter (Signed)
Nurse made aware. Pt will get lab work done when in for monitor appt.

## 2014-12-23 NOTE — Telephone Encounter (Signed)
New message      Calling to let you know they were able to do TSH but will need to redraw for Liver

## 2014-12-24 ENCOUNTER — Other Ambulatory Visit: Payer: Self-pay | Admitting: Orthopedic Surgery

## 2014-12-24 ENCOUNTER — Other Ambulatory Visit: Payer: Self-pay | Admitting: Orthopaedic Surgery

## 2014-12-24 DIAGNOSIS — H11121 Conjunctival concretions, right eye: Secondary | ICD-10-CM | POA: Diagnosis not present

## 2014-12-24 DIAGNOSIS — M549 Dorsalgia, unspecified: Secondary | ICD-10-CM

## 2014-12-25 ENCOUNTER — Ambulatory Visit
Admission: RE | Admit: 2014-12-25 | Discharge: 2014-12-25 | Disposition: A | Discharge status: Home / Self Care | Payer: Medicare Other | Source: Ambulatory Visit | Attending: Orthopaedic Surgery | Admitting: Orthopaedic Surgery

## 2014-12-25 ENCOUNTER — Telehealth: Payer: Self-pay | Admitting: *Deleted

## 2014-12-25 DIAGNOSIS — Z79899 Other long term (current) drug therapy: Secondary | ICD-10-CM

## 2014-12-25 DIAGNOSIS — E1129 Type 2 diabetes mellitus with other diabetic kidney complication: Secondary | ICD-10-CM | POA: Diagnosis not present

## 2014-12-25 DIAGNOSIS — M549 Dorsalgia, unspecified: Secondary | ICD-10-CM

## 2014-12-25 DIAGNOSIS — N186 End stage renal disease: Secondary | ICD-10-CM | POA: Diagnosis not present

## 2014-12-25 DIAGNOSIS — D509 Iron deficiency anemia, unspecified: Secondary | ICD-10-CM | POA: Diagnosis not present

## 2014-12-25 DIAGNOSIS — D631 Anemia in chronic kidney disease: Secondary | ICD-10-CM | POA: Diagnosis not present

## 2014-12-25 DIAGNOSIS — M5134 Other intervertebral disc degeneration, thoracic region: Secondary | ICD-10-CM | POA: Diagnosis not present

## 2014-12-25 DIAGNOSIS — I480 Paroxysmal atrial fibrillation: Secondary | ICD-10-CM

## 2014-12-25 DIAGNOSIS — N2581 Secondary hyperparathyroidism of renal origin: Secondary | ICD-10-CM | POA: Diagnosis not present

## 2014-12-25 NOTE — Telephone Encounter (Signed)
lmtcb   (needs 30 day event monitor and LFT lab)

## 2014-12-25 NOTE — Telephone Encounter (Signed)
Ok to speak with patient's wife, per his verbal ok. States he recently had a 48 hour holter monitor and wants to know if 30 day event monitor is still needed.  Informed patient and his wife that I would review this with Dr. Curt Bears tomorrow and call them back with recommendations/explanation.  They both are agreeable to plan.

## 2014-12-26 ENCOUNTER — Other Ambulatory Visit: Payer: Self-pay | Admitting: Internal Medicine

## 2014-12-27 DIAGNOSIS — N186 End stage renal disease: Secondary | ICD-10-CM | POA: Diagnosis not present

## 2014-12-27 DIAGNOSIS — Z992 Dependence on renal dialysis: Secondary | ICD-10-CM | POA: Diagnosis not present

## 2014-12-27 DIAGNOSIS — N2581 Secondary hyperparathyroidism of renal origin: Secondary | ICD-10-CM | POA: Diagnosis not present

## 2014-12-27 DIAGNOSIS — D509 Iron deficiency anemia, unspecified: Secondary | ICD-10-CM | POA: Diagnosis not present

## 2014-12-27 DIAGNOSIS — E1129 Type 2 diabetes mellitus with other diabetic kidney complication: Secondary | ICD-10-CM | POA: Diagnosis not present

## 2014-12-27 DIAGNOSIS — D631 Anemia in chronic kidney disease: Secondary | ICD-10-CM | POA: Diagnosis not present

## 2014-12-27 NOTE — Telephone Encounter (Signed)
Informed patient and his wife of reasoning and need for 30 day event monitor. Also informed them that we need LFTs same day he comes in to pick up event monitor. Patient and his wife verbalized understanding and agreeable to plan.  They are aware someone will call them next week to arrange this.

## 2014-12-30 ENCOUNTER — Ambulatory Visit (INDEPENDENT_AMBULATORY_CARE_PROVIDER_SITE_OTHER): Payer: Medicare Other | Admitting: Pharmacist Clinician (PhC)/ Clinical Pharmacy Specialist

## 2014-12-30 DIAGNOSIS — E1129 Type 2 diabetes mellitus with other diabetic kidney complication: Secondary | ICD-10-CM | POA: Diagnosis not present

## 2014-12-30 DIAGNOSIS — D631 Anemia in chronic kidney disease: Secondary | ICD-10-CM | POA: Diagnosis not present

## 2014-12-30 DIAGNOSIS — N186 End stage renal disease: Secondary | ICD-10-CM | POA: Diagnosis not present

## 2014-12-30 DIAGNOSIS — N2581 Secondary hyperparathyroidism of renal origin: Secondary | ICD-10-CM | POA: Diagnosis not present

## 2014-12-30 DIAGNOSIS — Z7901 Long term (current) use of anticoagulants: Secondary | ICD-10-CM | POA: Diagnosis not present

## 2014-12-30 DIAGNOSIS — I480 Paroxysmal atrial fibrillation: Secondary | ICD-10-CM

## 2014-12-30 DIAGNOSIS — D509 Iron deficiency anemia, unspecified: Secondary | ICD-10-CM | POA: Diagnosis not present

## 2014-12-30 DIAGNOSIS — Z23 Encounter for immunization: Secondary | ICD-10-CM | POA: Diagnosis not present

## 2014-12-30 LAB — POCT INR: INR: 3.3

## 2015-01-01 DIAGNOSIS — Z23 Encounter for immunization: Secondary | ICD-10-CM | POA: Diagnosis not present

## 2015-01-01 DIAGNOSIS — D631 Anemia in chronic kidney disease: Secondary | ICD-10-CM | POA: Diagnosis not present

## 2015-01-01 DIAGNOSIS — E1129 Type 2 diabetes mellitus with other diabetic kidney complication: Secondary | ICD-10-CM | POA: Diagnosis not present

## 2015-01-01 DIAGNOSIS — D509 Iron deficiency anemia, unspecified: Secondary | ICD-10-CM | POA: Diagnosis not present

## 2015-01-01 DIAGNOSIS — N186 End stage renal disease: Secondary | ICD-10-CM | POA: Diagnosis not present

## 2015-01-01 DIAGNOSIS — N2581 Secondary hyperparathyroidism of renal origin: Secondary | ICD-10-CM | POA: Diagnosis not present

## 2015-01-02 ENCOUNTER — Ambulatory Visit (INDEPENDENT_AMBULATORY_CARE_PROVIDER_SITE_OTHER): Payer: Medicare Other

## 2015-01-02 ENCOUNTER — Other Ambulatory Visit (INDEPENDENT_AMBULATORY_CARE_PROVIDER_SITE_OTHER): Payer: Medicare Other | Admitting: *Deleted

## 2015-01-02 DIAGNOSIS — I480 Paroxysmal atrial fibrillation: Secondary | ICD-10-CM | POA: Diagnosis not present

## 2015-01-02 DIAGNOSIS — E785 Hyperlipidemia, unspecified: Secondary | ICD-10-CM | POA: Diagnosis not present

## 2015-01-03 ENCOUNTER — Telehealth: Payer: Self-pay | Admitting: *Deleted

## 2015-01-03 ENCOUNTER — Telehealth: Payer: Self-pay | Admitting: Gastroenterology

## 2015-01-03 DIAGNOSIS — N2581 Secondary hyperparathyroidism of renal origin: Secondary | ICD-10-CM | POA: Diagnosis not present

## 2015-01-03 DIAGNOSIS — Z23 Encounter for immunization: Secondary | ICD-10-CM | POA: Diagnosis not present

## 2015-01-03 DIAGNOSIS — E1129 Type 2 diabetes mellitus with other diabetic kidney complication: Secondary | ICD-10-CM | POA: Diagnosis not present

## 2015-01-03 DIAGNOSIS — D509 Iron deficiency anemia, unspecified: Secondary | ICD-10-CM | POA: Diagnosis not present

## 2015-01-03 DIAGNOSIS — N186 End stage renal disease: Secondary | ICD-10-CM | POA: Diagnosis not present

## 2015-01-03 DIAGNOSIS — D631 Anemia in chronic kidney disease: Secondary | ICD-10-CM | POA: Diagnosis not present

## 2015-01-03 LAB — HEPATIC FUNCTION PANEL
ALBUMIN: 3.7 g/dL (ref 3.6–5.1)
ALK PHOS: 63 U/L (ref 40–115)
ALT: 503 U/L — ABNORMAL HIGH (ref 9–46)
AST: 318 U/L — ABNORMAL HIGH (ref 10–35)
BILIRUBIN INDIRECT: 0.4 mg/dL (ref 0.2–1.2)
BILIRUBIN TOTAL: 0.6 mg/dL (ref 0.2–1.2)
Bilirubin, Direct: 0.2 mg/dL (ref <=0.2)
Total Protein: 6.5 g/dL (ref 6.1–8.1)

## 2015-01-03 NOTE — Telephone Encounter (Signed)
Spoke with cardiologist.  Acute elevated in transaminases and patient feels ill (weak but no abd pains).  They already held his amio, will also stop his statin for now.  We will get him in our office early next week for further evaluation.

## 2015-01-03 NOTE — Telephone Encounter (Signed)
Scheduled patient to see Dr. Fuller Plan on 01/06/15 at 2:00pm. Called patient to inform him. Pt verbalized understanding.

## 2015-01-03 NOTE — Telephone Encounter (Addendum)
Dr. Curt Bears spoke with Dr. Ardis Hughs w/ GI about elevated LFTs. Advised to stop Atorvastatin.   They are seeing GI next week, Monday 01/06/15. Advised to go to ED is pain/fever begin. Patient verbalized understanding and agreeable to plan.

## 2015-01-06 ENCOUNTER — Other Ambulatory Visit (INDEPENDENT_AMBULATORY_CARE_PROVIDER_SITE_OTHER): Payer: Medicare Other

## 2015-01-06 ENCOUNTER — Ambulatory Visit (INDEPENDENT_AMBULATORY_CARE_PROVIDER_SITE_OTHER): Payer: Medicare Other | Admitting: Gastroenterology

## 2015-01-06 ENCOUNTER — Encounter: Payer: Self-pay | Admitting: Gastroenterology

## 2015-01-06 VITALS — BP 130/70 | HR 68 | Ht 70.5 in | Wt 201.2 lb

## 2015-01-06 DIAGNOSIS — R799 Abnormal finding of blood chemistry, unspecified: Secondary | ICD-10-CM | POA: Diagnosis not present

## 2015-01-06 DIAGNOSIS — E1129 Type 2 diabetes mellitus with other diabetic kidney complication: Secondary | ICD-10-CM | POA: Diagnosis not present

## 2015-01-06 DIAGNOSIS — Z86010 Personal history of colonic polyps: Secondary | ICD-10-CM | POA: Diagnosis not present

## 2015-01-06 DIAGNOSIS — K2270 Barrett's esophagus without dysplasia: Secondary | ICD-10-CM

## 2015-01-06 DIAGNOSIS — D509 Iron deficiency anemia, unspecified: Secondary | ICD-10-CM | POA: Diagnosis not present

## 2015-01-06 DIAGNOSIS — D631 Anemia in chronic kidney disease: Secondary | ICD-10-CM | POA: Diagnosis not present

## 2015-01-06 DIAGNOSIS — R7989 Other specified abnormal findings of blood chemistry: Secondary | ICD-10-CM

## 2015-01-06 DIAGNOSIS — N186 End stage renal disease: Secondary | ICD-10-CM | POA: Diagnosis not present

## 2015-01-06 DIAGNOSIS — Z23 Encounter for immunization: Secondary | ICD-10-CM | POA: Diagnosis not present

## 2015-01-06 DIAGNOSIS — R945 Abnormal results of liver function studies: Principal | ICD-10-CM

## 2015-01-06 DIAGNOSIS — Z7901 Long term (current) use of anticoagulants: Secondary | ICD-10-CM | POA: Diagnosis not present

## 2015-01-06 DIAGNOSIS — N2581 Secondary hyperparathyroidism of renal origin: Secondary | ICD-10-CM | POA: Diagnosis not present

## 2015-01-06 LAB — HEPATIC FUNCTION PANEL
ALBUMIN: 4.1 g/dL (ref 3.5–5.2)
ALK PHOS: 85 U/L (ref 39–117)
ALT: 232 U/L — AB (ref 0–53)
AST: 86 U/L — AB (ref 0–37)
Bilirubin, Direct: 0.2 mg/dL (ref 0.0–0.3)
TOTAL PROTEIN: 7.6 g/dL (ref 6.0–8.3)
Total Bilirubin: 0.6 mg/dL (ref 0.2–1.2)

## 2015-01-06 LAB — HEPATITIS PANEL, ACUTE
HCV Ab: NEGATIVE
Hep A IgM: NONREACTIVE
Hep B C IgM: NONREACTIVE
Hepatitis B Surface Ag: NEGATIVE

## 2015-01-06 NOTE — Progress Notes (Signed)
    History of Present Illness: This is a 74 year old male with multiple comorbidities including end-stage renal disease, moderate aortic stenosis, diabetes mellitus, coronary artery disease, atrial fibrillation, prior TIA, L carotid stenosis, macular degeneration, GERD, Barrett's esophagus and adenomatous colon polyps here for evaluation of elevated LFTs. He is accompanied by his wife and daughter. Known to have normal LFTs in December 2015. Recent LFTs below show AST and ALT elevations that recently increased. He has chronic right-sided abdominal pain and chronic back pain that waxes and wanes without any clear relationship to any digestive function. He underwent evaluation for his right upper quadrant pain a few years ago with no gastrointestinal etiology uncovered. He has been maintained on Lipitor for several years and this was stopped last week. He was started on amiodarone a few months ago and this was stopped a few weeks ago and has not been restarted.      Ref Range 4d ago  48mo ago     Total Bilirubin 0.2 - 1.2 mg/dL 0.6 0.3R    Bilirubin, Direct <=0.2 mg/dL 0.2     Indirect Bilirubin 0.2 - 1.2 mg/dL 0.4     Alkaline Phosphatase 40 - 115 U/L 63 64R    AST 10 - 35 U/L 318 (H) 42 (H)R    ALT 9 - 46 U/L 503 (H) 65 (H)R       Total Protein 6.1 - 8.1 g/dL 6.5 6.2 (L)R    Albumin 3.6 - 5.1 g/dL 3.7 3.                              Current Medications, Allergies, Past Medical History, Past Surgical History, Family History and Social History were reviewed in [employer] record.  Physical Exam: General: Well developed, well nourished, fatigued, chronically ill appearing, no acute distress Head: Normocephalic and atraumatic Eyes:  sclerae anicteric, EOMI Ears: Normal auditory acuity Mouth: No deformity or lesions Lungs: Clear throughout to auscultation Heart: Regular rate and rhythm; no murmurs, rubs or bruits Abdomen: Soft, non tender  and non distended. No masses, hepatosplenomegaly or hernias noted. Normal Bowel sounds Musculoskeletal: Symmetrical with no gross deformities  Pulses:  Normal pulses noted Extremities: No clubbing, cyanosis, edema or deformities noted Neurological: Alert oriented x 4, grossly nonfocal Psychological:  Alert and cooperative. Normal mood and affect  Assessment and Recommendations:  1. Increasing transaminases. Rule out medication side effects secondary to amiodarone or Lipitor. Trend LFTs off these medications. Rule out viral hepatitis, biliary disorders, other liver diseases. Obtain acute hepatitis panel and recheck LFTs today. Schedule abdominal ultrasound. If above unremarkable will obtain other standard hepatic serologies.  2. GERD, Barrett's esophagus. Continue standard antireflux measures and omeprazole 20 mg twice daily. Surveillance EGD recommended in July 2020 however given his comorbidities will need to reassess the risks, benefits.  3. Personal history of adenomatous colon polyps. Surveillance colonoscopy recommended in December 2017 however given his comorbidities will need to reassess the risks, benefits.  4. Atrial fibrillation on chronic anticoagulation.  5. ESRD on HD.   6. Moderate AS.

## 2015-01-06 NOTE — Patient Instructions (Signed)
Your physician has requested that you go to the basement for lab work before leaving today.  You have been scheduled for an abdominal ultrasound at [facility] (1st floor of hospital) on 01/10/15 at 9:30am. Please arrive 15 minutes prior to your appointment for registration. Make certain not to have anything to eat or drink 6 hours prior to your appointment. Should you need to reschedule your appointment, please contact radiology at [phone]. This test typically takes about 30 minutes to perform.  Thank you for choosing me and [facility].  Pricilla Riffle. Dagoberto Ligas., MD., Marval Regal  cc: Will Meredith [name], MD

## 2015-01-08 DIAGNOSIS — N2581 Secondary hyperparathyroidism of renal origin: Secondary | ICD-10-CM | POA: Diagnosis not present

## 2015-01-08 DIAGNOSIS — N186 End stage renal disease: Secondary | ICD-10-CM | POA: Diagnosis not present

## 2015-01-08 DIAGNOSIS — Z23 Encounter for immunization: Secondary | ICD-10-CM | POA: Diagnosis not present

## 2015-01-08 DIAGNOSIS — D509 Iron deficiency anemia, unspecified: Secondary | ICD-10-CM | POA: Diagnosis not present

## 2015-01-08 DIAGNOSIS — D631 Anemia in chronic kidney disease: Secondary | ICD-10-CM | POA: Diagnosis not present

## 2015-01-08 DIAGNOSIS — E1129 Type 2 diabetes mellitus with other diabetic kidney complication: Secondary | ICD-10-CM | POA: Diagnosis not present

## 2015-01-10 ENCOUNTER — Ambulatory Visit (HOSPITAL_COMMUNITY)
Admission: RE | Admit: 2015-01-10 | Discharge: 2015-01-10 | Disposition: A | Discharge status: Home / Self Care | Payer: Medicare Other | Source: Ambulatory Visit | Attending: Gastroenterology | Admitting: Gastroenterology

## 2015-01-10 DIAGNOSIS — N2581 Secondary hyperparathyroidism of renal origin: Secondary | ICD-10-CM | POA: Diagnosis not present

## 2015-01-10 DIAGNOSIS — Z23 Encounter for immunization: Secondary | ICD-10-CM | POA: Diagnosis not present

## 2015-01-10 DIAGNOSIS — R932 Abnormal findings on diagnostic imaging of liver and biliary tract: Secondary | ICD-10-CM | POA: Insufficient documentation

## 2015-01-10 DIAGNOSIS — N186 End stage renal disease: Secondary | ICD-10-CM | POA: Diagnosis not present

## 2015-01-10 DIAGNOSIS — R7989 Other specified abnormal findings of blood chemistry: Secondary | ICD-10-CM | POA: Insufficient documentation

## 2015-01-10 DIAGNOSIS — D509 Iron deficiency anemia, unspecified: Secondary | ICD-10-CM | POA: Diagnosis not present

## 2015-01-10 DIAGNOSIS — K824 Cholesterolosis of gallbladder: Secondary | ICD-10-CM | POA: Insufficient documentation

## 2015-01-10 DIAGNOSIS — R93429 Abnormal radiologic findings on diagnostic imaging of unspecified kidney: Secondary | ICD-10-CM | POA: Insufficient documentation

## 2015-01-10 DIAGNOSIS — E1129 Type 2 diabetes mellitus with other diabetic kidney complication: Secondary | ICD-10-CM | POA: Diagnosis not present

## 2015-01-10 DIAGNOSIS — R945 Abnormal results of liver function studies: Secondary | ICD-10-CM

## 2015-01-10 DIAGNOSIS — D631 Anemia in chronic kidney disease: Secondary | ICD-10-CM | POA: Diagnosis not present

## 2015-01-13 ENCOUNTER — Other Ambulatory Visit: Payer: Self-pay

## 2015-01-13 ENCOUNTER — Ambulatory Visit (INDEPENDENT_AMBULATORY_CARE_PROVIDER_SITE_OTHER): Payer: Medicare Other | Admitting: Pharmacist Clinician (PhC)/ Clinical Pharmacy Specialist

## 2015-01-13 DIAGNOSIS — R7989 Other specified abnormal findings of blood chemistry: Secondary | ICD-10-CM

## 2015-01-13 DIAGNOSIS — I480 Paroxysmal atrial fibrillation: Secondary | ICD-10-CM

## 2015-01-13 DIAGNOSIS — N2581 Secondary hyperparathyroidism of renal origin: Secondary | ICD-10-CM | POA: Diagnosis not present

## 2015-01-13 DIAGNOSIS — Z7901 Long term (current) use of anticoagulants: Secondary | ICD-10-CM | POA: Diagnosis not present

## 2015-01-13 DIAGNOSIS — R945 Abnormal results of liver function studies: Secondary | ICD-10-CM

## 2015-01-13 DIAGNOSIS — N186 End stage renal disease: Secondary | ICD-10-CM | POA: Diagnosis not present

## 2015-01-13 DIAGNOSIS — Z23 Encounter for immunization: Secondary | ICD-10-CM | POA: Diagnosis not present

## 2015-01-13 DIAGNOSIS — D509 Iron deficiency anemia, unspecified: Secondary | ICD-10-CM | POA: Diagnosis not present

## 2015-01-13 DIAGNOSIS — E1129 Type 2 diabetes mellitus with other diabetic kidney complication: Secondary | ICD-10-CM | POA: Diagnosis not present

## 2015-01-13 DIAGNOSIS — D631 Anemia in chronic kidney disease: Secondary | ICD-10-CM | POA: Diagnosis not present

## 2015-01-13 LAB — POCT INR: INR: 4.8

## 2015-01-14 DIAGNOSIS — H401133 Primary open-angle glaucoma, bilateral, severe stage: Secondary | ICD-10-CM | POA: Diagnosis not present

## 2015-01-14 DIAGNOSIS — H01003 Unspecified blepharitis right eye, unspecified eyelid: Secondary | ICD-10-CM | POA: Diagnosis not present

## 2015-01-15 ENCOUNTER — Emergency Department (HOSPITAL_COMMUNITY)
Admission: EM | Admit: 2015-01-15 | Discharge: 2015-01-15 | Disposition: A | Discharge status: Home / Self Care | Payer: Medicare Other | Attending: Emergency Medicine | Admitting: Emergency Medicine

## 2015-01-15 ENCOUNTER — Encounter (HOSPITAL_COMMUNITY): Payer: Self-pay | Admitting: Emergency Medicine

## 2015-01-15 ENCOUNTER — Emergency Department (HOSPITAL_COMMUNITY): Payer: Medicare Other

## 2015-01-15 DIAGNOSIS — Z8669 Personal history of other diseases of the nervous system and sense organs: Secondary | ICD-10-CM | POA: Insufficient documentation

## 2015-01-15 DIAGNOSIS — K219 Gastro-esophageal reflux disease without esophagitis: Secondary | ICD-10-CM | POA: Diagnosis not present

## 2015-01-15 DIAGNOSIS — I953 Hypotension of hemodialysis: Secondary | ICD-10-CM | POA: Diagnosis not present

## 2015-01-15 DIAGNOSIS — Z86010 Personal history of colonic polyps: Secondary | ICD-10-CM | POA: Insufficient documentation

## 2015-01-15 DIAGNOSIS — Z87442 Personal history of urinary calculi: Secondary | ICD-10-CM | POA: Diagnosis not present

## 2015-01-15 DIAGNOSIS — D509 Iron deficiency anemia, unspecified: Secondary | ICD-10-CM | POA: Diagnosis not present

## 2015-01-15 DIAGNOSIS — Z8673 Personal history of transient ischemic attack (TIA), and cerebral infarction without residual deficits: Secondary | ICD-10-CM | POA: Diagnosis not present

## 2015-01-15 DIAGNOSIS — F329 Major depressive disorder, single episode, unspecified: Secondary | ICD-10-CM | POA: Insufficient documentation

## 2015-01-15 DIAGNOSIS — R031 Nonspecific low blood-pressure reading: Secondary | ICD-10-CM | POA: Diagnosis not present

## 2015-01-15 DIAGNOSIS — I120 Hypertensive chronic kidney disease with stage 5 chronic kidney disease or end stage renal disease: Secondary | ICD-10-CM | POA: Insufficient documentation

## 2015-01-15 DIAGNOSIS — R42 Dizziness and giddiness: Secondary | ICD-10-CM | POA: Insufficient documentation

## 2015-01-15 DIAGNOSIS — Z880 Allergy status to penicillin: Secondary | ICD-10-CM | POA: Diagnosis not present

## 2015-01-15 DIAGNOSIS — N186 End stage renal disease: Secondary | ICD-10-CM | POA: Insufficient documentation

## 2015-01-15 DIAGNOSIS — Z992 Dependence on renal dialysis: Secondary | ICD-10-CM | POA: Diagnosis not present

## 2015-01-15 DIAGNOSIS — I4891 Unspecified atrial fibrillation: Secondary | ICD-10-CM | POA: Insufficient documentation

## 2015-01-15 DIAGNOSIS — K2270 Barrett's esophagus without dysplasia: Secondary | ICD-10-CM | POA: Insufficient documentation

## 2015-01-15 DIAGNOSIS — Z86018 Personal history of other benign neoplasm: Secondary | ICD-10-CM | POA: Insufficient documentation

## 2015-01-15 DIAGNOSIS — R55 Syncope and collapse: Secondary | ICD-10-CM | POA: Diagnosis not present

## 2015-01-15 DIAGNOSIS — Z87891 Personal history of nicotine dependence: Secondary | ICD-10-CM | POA: Insufficient documentation

## 2015-01-15 DIAGNOSIS — Z862 Personal history of diseases of the blood and blood-forming organs and certain disorders involving the immune mechanism: Secondary | ICD-10-CM | POA: Diagnosis not present

## 2015-01-15 DIAGNOSIS — Z79899 Other long term (current) drug therapy: Secondary | ICD-10-CM | POA: Diagnosis not present

## 2015-01-15 DIAGNOSIS — E119 Type 2 diabetes mellitus without complications: Secondary | ICD-10-CM | POA: Diagnosis not present

## 2015-01-15 DIAGNOSIS — Z7901 Long term (current) use of anticoagulants: Secondary | ICD-10-CM | POA: Diagnosis not present

## 2015-01-15 DIAGNOSIS — E1129 Type 2 diabetes mellitus with other diabetic kidney complication: Secondary | ICD-10-CM | POA: Diagnosis not present

## 2015-01-15 DIAGNOSIS — I252 Old myocardial infarction: Secondary | ICD-10-CM | POA: Insufficient documentation

## 2015-01-15 DIAGNOSIS — R011 Cardiac murmur, unspecified: Secondary | ICD-10-CM | POA: Diagnosis not present

## 2015-01-15 DIAGNOSIS — N2581 Secondary hyperparathyroidism of renal origin: Secondary | ICD-10-CM | POA: Diagnosis not present

## 2015-01-15 DIAGNOSIS — I959 Hypotension, unspecified: Secondary | ICD-10-CM | POA: Diagnosis not present

## 2015-01-15 DIAGNOSIS — Z23 Encounter for immunization: Secondary | ICD-10-CM | POA: Diagnosis not present

## 2015-01-15 DIAGNOSIS — D631 Anemia in chronic kidney disease: Secondary | ICD-10-CM | POA: Diagnosis not present

## 2015-01-15 DIAGNOSIS — I482 Chronic atrial fibrillation: Secondary | ICD-10-CM | POA: Diagnosis not present

## 2015-01-15 LAB — CBC
HEMATOCRIT: 35 % — AB (ref 39.0–52.0)
HEMOGLOBIN: 11.6 g/dL — AB (ref 13.0–17.0)
MCH: 33.8 pg (ref 26.0–34.0)
MCHC: 33.1 g/dL (ref 30.0–36.0)
MCV: 102 fL — AB (ref 78.0–100.0)
Platelets: 210 10*3/uL (ref 150–400)
RBC: 3.43 MIL/uL — ABNORMAL LOW (ref 4.22–5.81)
RDW: 17.9 % — ABNORMAL HIGH (ref 11.5–15.5)
WBC: 7.9 10*3/uL (ref 4.0–10.5)

## 2015-01-15 LAB — BASIC METABOLIC PANEL
ANION GAP: 11 (ref 5–15)
BUN: 10 mg/dL (ref 6–20)
CHLORIDE: 98 mmol/L — AB (ref 101–111)
CO2: 27 mmol/L (ref 22–32)
Calcium: 9 mg/dL (ref 8.9–10.3)
Creatinine, Ser: 3.93 mg/dL — ABNORMAL HIGH (ref 0.61–1.24)
GFR calc Af Amer: 16 mL/min — ABNORMAL LOW (ref >60)
GFR calc non Af Amer: 14 mL/min — ABNORMAL LOW (ref >60)
GLUCOSE: 122 mg/dL — AB (ref 65–99)
POTASSIUM: 3.4 mmol/L — AB (ref 3.5–5.1)
Sodium: 136 mmol/L (ref 135–145)

## 2015-01-15 LAB — PROTIME-INR
INR: 2.07 — ABNORMAL HIGH (ref 0.00–1.49)
INR: 2.2 — AB (ref <=1.1)
PROTHROMBIN TIME: 23.2 s — AB (ref 11.6–15.2)

## 2015-01-15 LAB — CBG MONITORING, ED: Glucose-Capillary: 101 mg/dL — ABNORMAL HIGH (ref 65–99)

## 2015-01-15 NOTE — ED Notes (Signed)
cbg 101 

## 2015-01-15 NOTE — ED Provider Notes (Signed)
CSN: [id number]     Arrival date & time 01/15/15  1432 History   First MD Initiated Contact with Patient 01/15/15 1511     Chief Complaint  Patient presents with  . Hypotension     (Consider location/radiation/quality/duration/timing/severity/associated sxs/prior Treatment) HPI  74 year old male with a history of ESRD as well as syncope of unknown known etiology presents with recurrent syncope. Patient was feeling lightheaded and had a low blood pressure with a systolic of 78 at dialysis today. They gave him 500 mL IV fluids and he felt somewhat better and his blood pressure came up to 1-2 systolic. He went and ate lunch. He was waiting in the car while his wife went shopping and when he got up out of the car shortly after standing he felt acutely lightheaded and passed out on the sidewalk. His wife think she hit his head there is no obvious trauma. He denies a headache. He is on Coumadin. Patient never felt chest pain, shortness of breath, and has otherwise been feeling okay. This is been an ongoing issue of low blood pressure at dialysis as well as syncope and near syncope for the past 2 or more months. He doesn't or chronic neck pain for over one year that is not particularly worse now. No focal weakness or numbness. He is currently wearing a loop recorder. He has a known history of aortic stenosis.  Past Medical History  Diagnosis Date  . End stage renal disease ([place])     hemodialysis 3 times a week  . Seasonal allergies   . Hyperlipidemia   . Anemia   . Depression   . Macular degeneration     both eyes  . GERD (gastroesophageal reflux disease)   . Hypertension   . CVA (cerebral infarction)     2004/affected left side  . Peptic ulcer     bleeding, 1969  . Kidney stones   . Renal insufficiency   . Diverticulosis   . Tubular adenoma of colon 07/2001  . Barrett's esophagus 05/2003  . Stroke [facility]) 2004  . Colon polyps   . Renal failure   . Hemodialysis patient ([place])   . Aortic  stenosis 06/15/12    TEE - EF 73-22%; grade 1 diastolic dysfunction; mild/mod aortic valve stenosis; Mitral valve had calcified annulus, mild pulm htn PA peak pressure 55mmHg  . Atrial fibrillation ([place]) 04/01/10    14 day event monitor - some sinus bradycardia, PACs and sinus tachycardia  . Atrial fibrillation ([place]) 11/03/09    R/P MV - EF 66%; normal myocardial perfusion study, w/o chest pain or EKG changes for ischemia  . S/P epidural steroid injection     last  injection over 10 years ago  . Diabetes mellitus without complication [facility])    Past Surgical History  Procedure Laterality Date  . Av fistula placement  2009  . Laminectomy  1969  . Tonsillectomy  1964  . 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 Cystoscopy  several times    kidney stones  . Back surgery     Family History  Problem Relation Age of Onset  . Colon cancer Neg Hx   . Stomach cancer Mother   . Hypertension Father     Died of heart attack   Social History  Substance Use Topics  . Smoking status: Former Smoker    Quit date: 01/20/1998  . Smokeless tobacco: Never Used  . Alcohol Use: No    Review of Systems  Respiratory: Negative for shortness of breath.   Cardiovascular: Negative for chest pain.  Gastrointestinal: Negative for vomiting and abdominal pain.  Neurological: Positive for syncope and light-headedness. Negative for weakness, numbness and headaches.      Allergies  Codeine; Penicillins; and Tramadol  Home Medications   Prior to Admission medications   Medication Sig Start Date End Date Taking? Authorizing Provider  acetaminophen (TYLENOL) 500 MG tablet Take 1,000 mg by mouth every 6 (six) hours as needed for pain.    Historical Provider, MD  b complex-vitamin c-folic acid (NEPHRO-VITE) 0.8 MG TABS Take 0.8 mg by mouth daily.     Historical Provider, MD  benzonatate (TESSALON) 200 MG capsule Take 1 capsule (200 mg total) by mouth 3 (three) times daily as needed for cough. 04/06/14   Ripudeep  Krystal Eaton, MD  bromocriptine (PARLODEL) 5 MG capsule Take 5 mg by mouth at bedtime.  11/30/10   Historical Provider, MD  calcium acetate (PHOSLO) 667 MG capsule Take 667 mg by mouth 3 (three) times daily with meals. Take 2 tablets each morning, 1 at mid-day and 1 each evening    Historical Provider, MD  cetirizine (ZYRTEC) 10 MG tablet Take 10 mg by mouth daily.    Historical Provider, MD  cinacalcet (SENSIPAR) 30 MG tablet Take 30 mg by mouth every other day.     Historical Provider, MD  cromolyn (OPTICROM) 4 % ophthalmic solution Place 1 drop into both eyes 4 (four) times daily. 12/11/14   Historical Provider, MD  dorzolamide-timolol (COSOPT) 22.3-6.8 MG/ML ophthalmic solution Place 1 drop into the right eye 2 (two) times daily. 08/13/13   Historical Provider, MD  FLUoxetine (PROZAC) 20 MG capsule Take 40 mg by mouth 2 (two) times daily. TAKE 2 TABLETS IN THE AM. 11/30/10   Historical Provider, MD  midodrine (PROAMATINE) 10 MG tablet Take 1 tablet (10 mg total) by mouth 3 (three) times daily as needed. 12/05/14   Skeet Latch, MD  ofloxacin (OCUFLOX) 0.3 % ophthalmic solution  12/24/14   Historical Provider, MD  omeprazole (PRILOSEC) 20 MG capsule Take 1 capsule (20 mg total) by mouth 2 (two) times daily before a meal. 04/06/14   Ripudeep K Rai, MD  warfarin (COUMADIN) 2.5 MG tablet TAKE 1 & 1/2 TO 2 TABLETS BY MOUTH DAILY AS DIRECTED BY COUMADIN CLINIC 12/26/14   Pixie Casino, MD   BP 134/61 mmHg  Pulse 53  Resp 24  Ht 5\' 10"  (1.778 m)  Wt 200 lb (90.719 kg)  BMI 28.70 kg/m2  SpO2 95% Physical Exam  Constitutional: He is oriented to person, place, and time. He appears well-developed and well-nourished. No distress.  HENT:  Head: Normocephalic and atraumatic.  Right Ear: External ear normal.  Left Ear: External ear normal.  Nose: Nose normal.  Eyes: EOM are normal. Pupils are equal, round, and reactive to light. Right eye exhibits no discharge. Left eye exhibits no discharge.  Neck: Normal range  of motion. Neck supple.  Cardiovascular: Normal rate, regular rhythm and intact distal pulses.   Murmur heard.  Systolic murmur is present  Pulmonary/Chest: Effort normal and breath sounds normal.  Abdominal: Soft. There is no tenderness.  Musculoskeletal: He exhibits no edema.  Neurological: He is alert and oriented to person, place, and time.  CN 2-12 grossly intact. 5/5 strength in all 4 extremities  Skin: Skin is warm and dry. He is not diaphoretic.  Left AV fistula in forearm with normal thrill  Nursing note and vitals reviewed.  ED Course  Procedures (including critical care time) Labs Review Labs Reviewed  BASIC METABOLIC PANEL - Abnormal; Notable for the following:    Potassium 3.4 (*)    Chloride 98 (*)    Glucose, Bld 122 (*)    Creatinine, Ser 3.93 (*)    GFR calc non Af Amer 14 (*)    GFR calc Af Amer 16 (*)    All other components within normal limits  CBC - Abnormal; Notable for the following:    RBC 3.43 (*)    Hemoglobin 11.6 (*)    HCT 35.0 (*)    MCV 102.0 (*)    RDW 17.9 (*)    All other components within normal limits  PROTIME-INR - Abnormal; Notable for the following:    Prothrombin Time 23.2 (*)    INR 2.07 (*)    All other components within normal limits  CBG MONITORING, ED - Abnormal; Notable for the following:    Glucose-Capillary 101 (*)    All other components within normal limits    Imaging Review Dg Chest 2 View  01/15/2015  CLINICAL DATA:  Syncope EXAM: CHEST  2 VIEW COMPARISON:  04/03/2014 FINDINGS: Borderline cardiomegaly. No acute infiltrate or pleural effusion. No pulmonary edema. Bony thorax is unremarkable. IMPRESSION: No active cardiopulmonary disease. Electronically Signed   By: Lahoma Crocker M.D.   On: 01/15/2015 16:20   Ct Head Wo Contrast  01/15/2015  CLINICAL DATA:  Hypotensive episode after dialysis. Fall after dialysis. EXAM: CT HEAD WITHOUT CONTRAST TECHNIQUE: Contiguous axial images were obtained from the base of the skull  through the vertex without intravenous contrast. COMPARISON:  MR brain 11/12/2014 and CT head 11/12/2014. FINDINGS: Small rounded area of intermediate attenuation is seen in the posterior limb right internal capsule (series 2, image 17), possibly more prominent than on 11/12/2014. No additional evidence of an acute infarct, acute hemorrhage, mass lesion, mass effect or hydrocephalus. Atrophy. Mild confluent low-attenuation in the periventricular deep white matter. Encephalomalacia in the left PCA territory. Visualized portions of the paranasal sinuses and mastoid air cells are clear. No fracture. Degenerative change at C1-2 is partially imaged. IMPRESSION: 1. Possible subacute lacunar infarct in the posterior limb right internal capsule. 2. Atrophy and chronic microvascular white matter ischemic changes. 3. Left PCA territory encephalomalacia. Electronically Signed   By: Lorin Picket M.D.   On: 01/15/2015 16:16   Mr Brain Wo Contrast  01/15/2015  CLINICAL DATA:  Presyncopal episode without loss of consciousness after dialysis today. Abnormal CT of the head. EXAM: MRI HEAD WITHOUT CONTRAST TECHNIQUE: Multiplanar, multiecho pulse sequences of the brain and surrounding structures were obtained without intravenous contrast. COMPARISON:  CT head without contrast from the same day. FINDINGS: The diffusion-weighted images demonstrate no evidence for acute or subacute infarction. Remote left PCA and posterior left MCA territory infarct is again seen. Moderate generalized atrophy is present. A remote subcortical infarct is present in the left frontal lobe. There is relatively little white matter disease otherwise. No focal lesion is present in the posterior limb right internal capsule. The internal auditory canals are within normal limits. The brainstem is within normal limits. Flow is present in the vertebral arteries and right internal carotid artery. There is slow or occluded flow in the left internal carotid  artery and marked attenuation of the left MCA branch vessels. Bilateral lens extractions are noted. The paranasal sinuses and mastoid air cells are clear. Slight anterolisthesis is present at C3-4. There is significant loss of disc height  and moderate central canal stenosis at C4-5. Midline structures are otherwise unremarkable. IMPRESSION: 1. No acute infarct. 2. Stable remote left PCA and likely posterior left MCA infarct. 3. Chronic left ICA occlusion 4. Marked attenuation of left ACA and MCA branch vessels. 5. Degenerative changes in the upper cervical spine are most severe at C4-5. Electronically Signed   By: San Morelle M.D.   On: 01/15/2015 19:54   I have personally reviewed and evaluated these images and lab results as part of my medical decision-making.   EKG Interpretation   Date/Time:  Wednesday January 15 2015 14:36:41 EDT Ventricular Rate:  81 PR Interval:    QRS Duration: 98 QT Interval:  455 QTC Calculation: 528 R Axis:   -48 Text Interpretation:  Normal sinus rhythm Multi interpolated vent  premature complexes Inferior infarct, old no significant change since Aug  2016 Confirmed by Regenia Skeeter  MD, Niamh Rada ([id number]) on 01/15/2015 3:21:06 PM      MDM   Final diagnoses:  Syncope and collapse    Unclear why the patient had syncope. He has normal-appearing now with a normal neuro exam and has no orthostatic vital sign changes. Denies lightheadedness or dizziness now. This is been an ongoing problem with a significant outpatient workup with no obvious solution. He does have a history of aortic stenosis but this was not exactly exertional and does not appear to have acute CHF or ACS. Loop recorder shows no arrythmic events.  He has had ongoing symptoms for several months. His CT, which was obtained to rule out acute bleed from fall shows possible infarct. However his MRI is negative. No symptoms consistent with a stroke. Given he feels normal now, will recommend following up  closely with his PCP and cardiologist and to return if any symptoms were to worsen or return.    Sherwood Gambler, MD 01/15/15 2337

## 2015-01-15 NOTE — ED Notes (Signed)
[facility] reports pt hypotensive post dialysis. EMS reports pt had a fall post dialysis. Pt states that the hypotension started approximately 3 months prior. Pt states he felt like he was going to pass out prior to fall, but does not think he had LOC. Pt received dialysis today, and was given 800 ml bolus at dialysis center. Pt states some pain in neck, not started recently. Pt states soreness when moving head back and to both sides.

## 2015-01-15 NOTE — ED Notes (Signed)
Pt was able to complete orthostatic vital signs with stand by assistance only. Pt remains monitored by blood pressure, pulse ox, and 5 lead. pts family remains at bedside.

## 2015-01-15 NOTE — Discharge Instructions (Signed)

## 2015-01-15 NOTE — ED Notes (Signed)
Alexander [name] in main states she will add on PT/INR

## 2015-01-15 NOTE — ED Notes (Signed)
Pt to MRI

## 2015-01-15 NOTE — ED Notes (Signed)
Dr. Goldston at bedside.  

## 2015-01-15 NOTE — ED Notes (Signed)
Pt to CT

## 2015-01-15 NOTE — ED Notes (Addendum)
Loop recorder called and paperwork faxed by Raiford Noble and given to Dr. Regenia Skeeter.    Per company:  pt was SB today with 54 BPM noted, also 01/11/15 recordings noted pt SR with 67 BPM with heart block.

## 2015-01-17 ENCOUNTER — Ambulatory Visit (INDEPENDENT_AMBULATORY_CARE_PROVIDER_SITE_OTHER): Payer: Medicare Other | Admitting: Cardiovascular Disease

## 2015-01-17 ENCOUNTER — Encounter: Payer: Self-pay | Admitting: Cardiovascular Disease

## 2015-01-17 ENCOUNTER — Ambulatory Visit (INDEPENDENT_AMBULATORY_CARE_PROVIDER_SITE_OTHER): Payer: Medicare Other | Admitting: Pharmacist Clinician (PhC)/ Clinical Pharmacy Specialist

## 2015-01-17 VITALS — BP 150/72 | HR 55 | Ht 70.5 in | Wt 204.3 lb

## 2015-01-17 DIAGNOSIS — E1129 Type 2 diabetes mellitus with other diabetic kidney complication: Secondary | ICD-10-CM | POA: Diagnosis not present

## 2015-01-17 DIAGNOSIS — R5383 Other fatigue: Secondary | ICD-10-CM

## 2015-01-17 DIAGNOSIS — Z23 Encounter for immunization: Secondary | ICD-10-CM | POA: Diagnosis not present

## 2015-01-17 DIAGNOSIS — I480 Paroxysmal atrial fibrillation: Secondary | ICD-10-CM | POA: Diagnosis not present

## 2015-01-17 DIAGNOSIS — N186 End stage renal disease: Secondary | ICD-10-CM | POA: Diagnosis not present

## 2015-01-17 DIAGNOSIS — I950 Idiopathic hypotension: Secondary | ICD-10-CM

## 2015-01-17 DIAGNOSIS — R0602 Shortness of breath: Secondary | ICD-10-CM

## 2015-01-17 DIAGNOSIS — Z7901 Long term (current) use of anticoagulants: Secondary | ICD-10-CM

## 2015-01-17 DIAGNOSIS — D631 Anemia in chronic kidney disease: Secondary | ICD-10-CM | POA: Diagnosis not present

## 2015-01-17 DIAGNOSIS — D509 Iron deficiency anemia, unspecified: Secondary | ICD-10-CM | POA: Diagnosis not present

## 2015-01-17 DIAGNOSIS — N2581 Secondary hyperparathyroidism of renal origin: Secondary | ICD-10-CM | POA: Diagnosis not present

## 2015-01-17 LAB — POCT INR: INR: 1.5

## 2015-01-17 MED ORDER — MIDODRINE HCL 10 MG PO TABS: 10.0000 mg | ORAL_TABLET | Freq: Three times a day (TID) | ORAL | Status: DC | PRN

## 2015-01-17 NOTE — Progress Notes (Signed)
Cardiology Office Note   Date:  01/17/2015   ID:  Alexander [name], DOB 1940/05/09, MRN [id number]  PCP:  Horatio Pel, MD  Cardiologist:   Sharol Harness, MD   Chief Complaint  Patient presents with  . Hospitalization Follow-up    syncopal  . Edema    both feet  . Shortness of Breath    no SOB  . Chest Pain    no chest pain       History of Present Illness: Alexander [name] is a 74 y.o. malewith moderate aortic stenosis, ESRD on HD, diabetes mellitus, hyperlipidemia, carotid artery disease with complete occlusion of the L carotid, prior TIA and atrial fibrillation  who presents for follow up on hypotension.  He had HD on 65/46 and his systolic BP was 78 mmHg.  He was treated with a 500 ml bolus with improvement in his symptoms and blood pressure.  After the 500 ml fluid bolus there was no net fluid removal.  After leaving HD he was feeling well and  passed out and hit his head on the sidewalk.  When he arrived in the ED his BP was 134/61, HR 53 bpm.  He was wearing a loop recorder that reportedly showed no arrhythmic events.  Alexander [name] typically takes midodrine whenever his SBP is <130 mmHg.  Typically this is twice per day in the AM and at lunch.  The day prior to his syncopal event he did not take any midodrine because his SBP was consistetntly at 130 mmHg.  He denies any chest pain, shortness of breath, or palpitations prior to the event.  Alexander [name] had HD today and tolerated it without complication.  He took 2 midodrine tablets this AM and 1 in the afternoon.  His dry weight was increased to 91 kg.  His blood pressure during HD was 124 mmHg and 116 mmHg after HD.  His only complaints is fatigue that has been ongoing for approximately one year and worse in the last month.    After his last appointment, he was seen by Dr. Curt Bears for evaluation of his bradycardia. An amiodarone level was ordered, which was in the therapeutic range.  Amiodarone was held in order to  determine if this would improve his bradycardia.  He was also set up with a 30 day event monitor.  He was also noted to have transaminitis (AST 42, ALT 65), so his statin was also held.     Alexander [name] had a negative nuclear stress test in Augues2014.  Past Medical History  Diagnosis Date  . End stage renal disease ([place])     hemodialysis 3 times a week  . Seasonal allergies   . Hyperlipidemia   . Anemia   . Depression   . Macular degeneration     both eyes  . GERD (gastroesophageal reflux disease)   . Hypertension   . CVA (cerebral infarction)     2004/affected left side  . Peptic ulcer     bleeding, 1969  . Kidney stones   . Renal insufficiency   . Diverticulosis   . Tubular adenoma of colon 07/2001  . Barrett's esophagus 05/2003  . Stroke [facility]) 2004  . Colon polyps   . Renal failure   . Hemodialysis patient ([place])   . Aortic stenosis 06/15/12    TEE - EF 50-35%; grade 1 diastolic dysfunction; mild/mod aortic valve stenosis; Mitral valve had calcified annulus, mild pulm htn PA peak pressure 108mmHg  . Atrial  fibrillation ([place]) 04/01/10    14 day event monitor - some sinus bradycardia, PACs and sinus tachycardia  . Atrial fibrillation ([place]) 11/03/09    R/P MV - EF 66%; normal myocardial perfusion study, w/o chest pain or EKG changes for ischemia  . S/P epidural steroid injection     last  injection over 10 years ago  . Diabetes mellitus without complication [facility])     Past Surgical History  Procedure Laterality Date  . Av fistula placement  2009  . Laminectomy  1969  . Tonsillectomy  1964  . 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 Cystoscopy  several times    kidney stones  . Back surgery       Current Outpatient Prescriptions  Medication Sig Dispense Refill  . acetaminophen (TYLENOL) 500 MG tablet Take 1,000 mg by mouth every 6 (six) hours as needed for pain.    [name] b complex-vitamin c-folic acid (NEPHRO-VITE) 0.8 MG TABS Take 0.8 mg by mouth daily.     . benzonatate  (TESSALON) 200 MG capsule Take 1 capsule (200 mg total) by mouth 3 (three) times daily as needed for cough. 60 capsule 0  . bromocriptine (PARLODEL) 5 MG capsule Take 5 mg by mouth at bedtime.     . calcium acetate (PHOSLO) 667 MG capsule Take 667 mg by mouth 3 (three) times daily with meals. Take 2 tablets each morning, 1 at mid-day and 1 each evening    . cetirizine (ZYRTEC) 10 MG tablet Take 10 mg by mouth at bedtime.     . cinacalcet (SENSIPAR) 30 MG tablet Take 30 mg by mouth every other day.     . dorzolamide-timolol (COSOPT) 22.3-6.8 MG/ML ophthalmic solution Place 1 drop into the right eye 2 (two) times daily.    [name] FLUoxetine (PROZAC) 20 MG capsule Take 40 mg by mouth 2 (two) times daily. TAKE 2 TABLETS IN THE AM.    . midodrine (PROAMATINE) 10 MG tablet Take 1 tablet (10 mg total) by mouth 3 (three) times daily as needed. (Patient taking differently: Take 10 mg by mouth 3 (three) times daily as needed (low blood pressure 100). ) 90 tablet 5  . omeprazole (PRILOSEC) 20 MG capsule Take 1 capsule (20 mg total) by mouth 2 (two) times daily before a meal. 60 capsule 3  . warfarin (COUMADIN) 2.5 MG tablet TAKE 1 & 1/2 TO 2 TABLETS BY MOUTH DAILY AS DIRECTED BY COUMADIN CLINIC (Patient taking differently: Take 2.5 mg by mouth daily on Wed and Sat. Take 3.75 mg by mouth daily on Sun, Mon, Tue, Thur, and Fri in the evening.) 50 tablet 4   No current facility-administered medications for this visit.    Allergies:   Codeine; Penicillins; and Tramadol    Social History:  The patient  reports that he quit smoking about 17 years ago. He has never used smokeless tobacco. He reports that he does not drink alcohol or use illicit drugs.   Family History:  The patient's family history includes Hypertension in his father; Stomach cancer in his mother. There is no history of Colon cancer.    ROS:  Please see the history of present illness.   Otherwise, review of systems are positive for none.   All other  systems are reviewed and negative.    PHYSICAL EXAM: VS:  BP 150/72 mmHg  Pulse 55  Ht 5' 10.5" (1.791 m)  Wt 92.67 kg (204 lb 4.8 oz)  BMI 28.89 kg/m2 ,  BMI Body mass index is 28.89 kg/(m^2).  Orthostatics: Laying: 150/72, 54; sitting 141/72, 54; standing 114/633; 73 GENERAL:  Well appearing HEENT:  Pupils equal round and reactive, fundi not visualized, oral mucosa unremarkable NECK:  No jugular venous distention, waveform within normal limits, carotid upstroke brisk and symmetric, no bruits, no thyromegaly LYMPHATICS:  No cervical adenopathy LUNGS:  Clear to auscultation bilaterally HEART:  RRR.  PMI not displaced or sustained,S1 and S2 within normal limits, no S3, no S4, no clicks, no rubs, III/VI mid-peaking crescendo-decrescendo murmur at the LUSB. ABD:  Flat, positive bowel sounds normal in frequency in pitch, no bruits, no rebound, no guarding, no midline pulsatile mass, no hepatomegaly, no splenomegaly EXT:  2 plus pulses throughout, no edema, no cyanosis no clubbing SKIN:  No rashes no nodules NEURO:  Cranial nerves II through XII grossly intact, motor grossly intact throughout PSYCH:  Cognitively intact, oriented to person place and time    EKG:  EKG is ordered today. Sinus bradycardia.  First degree heart block.  LAFB.  U waves. The ekg from 11/19/14 demonstrates sinus bradycardia at 49 bpm. First degree heart block.   11/28/14 TTE: Study Conclusions  - Left ventricle: The cavity size was normal. Systolic function was normal. The estimated ejection fraction was in the range of 60% to 65%. Wall motion was normal; there were no regional wall motion abnormalities. Features are consistent with a pseudonormal left ventricular filling pattern, with concomitant abnormal relaxation and increased filling pressure (grade 2 diastolic dysfunction). Doppler parameters are consistent with high ventricular filling pressure. - Aortic valve: Severe diffuse thickening and  calcification. There was moderate stenosis.  VTI ratio of LVOT to aortic valve: 0.31. Valve area (VTI): 1.27 cm^2. Indexed valve area (VTI): 0.59 cm^2/m^2. Mean velocity ratio of LVOT to aortic valve: 0.31. Valve area (Vmean): 1.29 cm^2. Indexed valve area (Vmean): 0.6 cm^2/m^2.  Mean gradient (S): 22 mm Hg. - Mitral valve: Severely calcified annulus. There was mild regurgitation. - Left atrium: The atrium was mildly dilated. - Right ventricle: The cavity size was mildly dilated. Wall thickness was normal. - Tricuspid valve: There was trivial regurgitation. - Pulmonic valve: There was trivial regurgitation. - Pulmonary arteries: PA peak pressure: 37 mm Hg (S).   Recent Labs: 04/04/2014: B Natriuretic Peptide 571.9* 11/18/2014: Magnesium 1.9 12/18/2014: TSH 1.368 01/06/2015: ALT 232* 01/15/2015: BUN 10; Creatinine, Ser 3.93*; Hemoglobin 11.6*; Platelets 210; Potassium 3.4*; Sodium 136    Lipid Panel    Component Value Date/Time   CHOL 104 04/04/2014 0843   TRIG 68 04/04/2014 0843   HDL 35* 04/04/2014 0843   CHOLHDL 3.0 04/04/2014 0843   VLDL 14 04/04/2014 0843   LDLCALC 55 04/04/2014 0843      Wt Readings from Last 3 Encounters:  01/17/15 92.67 kg (204 lb 4.8 oz)  01/15/15 90.719 kg (200 lb)  01/06/15 91.264 kg (201 lb 3.2 oz)      Other studies Reviewed: Additional studies/ records that were reviewed today include: [facility] records Review of the above records demonstrates:  Please see elsewhere in the note.     ASSESSMENT AND PLAN:  # Hypotension/syncope: His BP seems to be better controlled when he uses the midodrine.  When he held it for one day, he had syncope the following day.  He will continue to take his BP regularly.  When his SBP is at 130 mmHg or less, he will take one midodrine tables.  If it is <120 mmHg, he will take 2 tabs.  This  is especially important on the days he has HD.  Given that hie also reports significant fatigue in the last year, we will  also refer him for Lexiscan Cardiolite in order to rule out ischemia as the cause of his fatigue and hypotension.  # Bradycardia/atrial fibrillation: Alexander [name] continues to have bradycardia despite stopping amiodarone.  It is unclear how much this may be contributing.  His event monitor reportedly did not reveal any significant arrhythmias.  He remains in sinus rhythm off antiarrhythmics.  Continue warfarin for anticoagulation.  # Moderate aortic stenosis: Alexander [name] aortic stenosis is in the moderate range.  It is unlikely that this is causing his symptoms, though it certainly doesn't help. For now we will continue to monitor it anually.  Current medicines are reviewed at length with the patient today.  The patient does not have concerns regarding medicines.  The following changes have been made:  Midodrine 1-2 tabs tid for SBP <130 mmHg.  Take on every HD day unless SBP is >140 mmHg.  Labs/ tests ordered today include:  No orders of the defined types were placed in this encounter.     Disposition:   FU with Dr. Jonelle Sidle C. Marshall in 3 months.   Signed, Sharol Harness, MD  01/17/2015 2:32 PM    [place]

## 2015-01-17 NOTE — Patient Instructions (Signed)
Dr Oval Linsey has recommended making the following medication changes: Please take Midodrine three times daily as needed if your blood pressure is less than 130.  Your physician has requested that you have a lexiscan myoview. For further information please visit [email or web address]. Please follow instruction sheet, as given.  Dr Oval Linsey recommends that you schedule a follow-up appointment in 3 months.  If you need a refill on your cardiac medications before your next appointment, please call your pharmacy.

## 2015-01-18 ENCOUNTER — Encounter: Payer: Self-pay | Admitting: Cardiovascular Disease

## 2015-01-20 ENCOUNTER — Ambulatory Visit: Payer: Medicare Other | Admitting: Pharmacist Clinician (PhC)/ Clinical Pharmacy Specialist

## 2015-01-20 DIAGNOSIS — E1129 Type 2 diabetes mellitus with other diabetic kidney complication: Secondary | ICD-10-CM | POA: Diagnosis not present

## 2015-01-20 DIAGNOSIS — N2581 Secondary hyperparathyroidism of renal origin: Secondary | ICD-10-CM | POA: Diagnosis not present

## 2015-01-20 DIAGNOSIS — D509 Iron deficiency anemia, unspecified: Secondary | ICD-10-CM | POA: Diagnosis not present

## 2015-01-20 DIAGNOSIS — D631 Anemia in chronic kidney disease: Secondary | ICD-10-CM | POA: Diagnosis not present

## 2015-01-20 DIAGNOSIS — Z23 Encounter for immunization: Secondary | ICD-10-CM | POA: Diagnosis not present

## 2015-01-20 DIAGNOSIS — N186 End stage renal disease: Secondary | ICD-10-CM | POA: Diagnosis not present

## 2015-01-22 ENCOUNTER — Emergency Department (EMERGENCY_DEPARTMENT_HOSPITAL): Payer: Medicare Other

## 2015-01-22 ENCOUNTER — Emergency Department (HOSPITAL_COMMUNITY): Payer: Medicare Other

## 2015-01-22 ENCOUNTER — Encounter (HOSPITAL_COMMUNITY): Payer: Self-pay | Admitting: Emergency Medicine

## 2015-01-22 ENCOUNTER — Emergency Department (HOSPITAL_COMMUNITY)
Admission: EM | Admit: 2015-01-22 | Discharge: 2015-01-22 | Disposition: A | Discharge status: Home / Self Care | Payer: Medicare Other | Attending: Physician Assistant | Admitting: Physician Assistant

## 2015-01-22 DIAGNOSIS — Z8669 Personal history of other diseases of the nervous system and sense organs: Secondary | ICD-10-CM | POA: Insufficient documentation

## 2015-01-22 DIAGNOSIS — Z87891 Personal history of nicotine dependence: Secondary | ICD-10-CM | POA: Diagnosis not present

## 2015-01-22 DIAGNOSIS — Z8673 Personal history of transient ischemic attack (TIA), and cerebral infarction without residual deficits: Secondary | ICD-10-CM | POA: Insufficient documentation

## 2015-01-22 DIAGNOSIS — Z87442 Personal history of urinary calculi: Secondary | ICD-10-CM | POA: Insufficient documentation

## 2015-01-22 DIAGNOSIS — M25422 Effusion, left elbow: Secondary | ICD-10-CM | POA: Diagnosis not present

## 2015-01-22 DIAGNOSIS — E1129 Type 2 diabetes mellitus with other diabetic kidney complication: Secondary | ICD-10-CM | POA: Diagnosis not present

## 2015-01-22 DIAGNOSIS — M25522 Pain in left elbow: Secondary | ICD-10-CM | POA: Diagnosis not present

## 2015-01-22 DIAGNOSIS — Z992 Dependence on renal dialysis: Secondary | ICD-10-CM | POA: Insufficient documentation

## 2015-01-22 DIAGNOSIS — R52 Pain, unspecified: Secondary | ICD-10-CM

## 2015-01-22 DIAGNOSIS — Z862 Personal history of diseases of the blood and blood-forming organs and certain disorders involving the immune mechanism: Secondary | ICD-10-CM | POA: Insufficient documentation

## 2015-01-22 DIAGNOSIS — Z86018 Personal history of other benign neoplasm: Secondary | ICD-10-CM | POA: Insufficient documentation

## 2015-01-22 DIAGNOSIS — Z79899 Other long term (current) drug therapy: Secondary | ICD-10-CM | POA: Insufficient documentation

## 2015-01-22 DIAGNOSIS — M25529 Pain in unspecified elbow: Secondary | ICD-10-CM

## 2015-01-22 DIAGNOSIS — Z8711 Personal history of peptic ulcer disease: Secondary | ICD-10-CM | POA: Insufficient documentation

## 2015-01-22 DIAGNOSIS — Z86010 Personal history of colonic polyps: Secondary | ICD-10-CM | POA: Insufficient documentation

## 2015-01-22 DIAGNOSIS — E119 Type 2 diabetes mellitus without complications: Secondary | ICD-10-CM | POA: Diagnosis not present

## 2015-01-22 DIAGNOSIS — F329 Major depressive disorder, single episode, unspecified: Secondary | ICD-10-CM | POA: Diagnosis not present

## 2015-01-22 DIAGNOSIS — I120 Hypertensive chronic kidney disease with stage 5 chronic kidney disease or end stage renal disease: Secondary | ICD-10-CM | POA: Insufficient documentation

## 2015-01-22 DIAGNOSIS — Z7901 Long term (current) use of anticoagulants: Secondary | ICD-10-CM | POA: Diagnosis not present

## 2015-01-22 DIAGNOSIS — K219 Gastro-esophageal reflux disease without esophagitis: Secondary | ICD-10-CM | POA: Diagnosis not present

## 2015-01-22 DIAGNOSIS — Z880 Allergy status to penicillin: Secondary | ICD-10-CM | POA: Insufficient documentation

## 2015-01-22 DIAGNOSIS — I4891 Unspecified atrial fibrillation: Secondary | ICD-10-CM | POA: Insufficient documentation

## 2015-01-22 DIAGNOSIS — Z23 Encounter for immunization: Secondary | ICD-10-CM | POA: Diagnosis not present

## 2015-01-22 DIAGNOSIS — N186 End stage renal disease: Secondary | ICD-10-CM | POA: Diagnosis not present

## 2015-01-22 DIAGNOSIS — N2581 Secondary hyperparathyroidism of renal origin: Secondary | ICD-10-CM | POA: Diagnosis not present

## 2015-01-22 DIAGNOSIS — D509 Iron deficiency anemia, unspecified: Secondary | ICD-10-CM | POA: Diagnosis not present

## 2015-01-22 DIAGNOSIS — D631 Anemia in chronic kidney disease: Secondary | ICD-10-CM | POA: Diagnosis not present

## 2015-01-22 IMAGING — CT CT EXTREM UP ENTIRE ARM*L* W/O CM
1 series · 1 of 2 positions shown · non-contrast
Comparison: None.

CLINICAL DATA: Left lower forearm fistula, with pain at the left
antecubital fossa. Assess for mild ascites. Initial encounter.

EXAM:
CT OF THE UPPER LEFT ARM WITHOUT CONTRAST
TECHNIQUE: Multidetector CT imaging was performed according to the standard
protocol. Multiplanar CT image reconstructions were also generated.

[Series 100: scout · coronal · 0.6mm · 0.98mm/px · 1 of 2 slices shown]
[im 2/2]
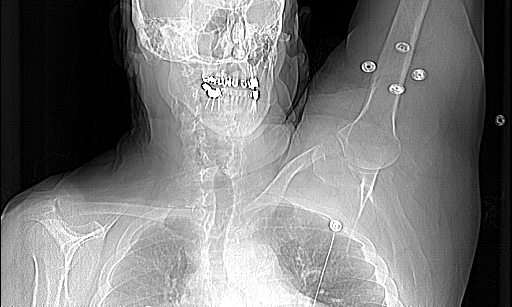

[1 of 2 positions shown; findings below may reference images not displayed]

FINDINGS: The dilated fistula is noted along the radial aspect of the left
forearm. There is mild associated soft tissue stranding along the
proximal aspect of the fistula, with slight wall thickening, raising
question for mild inflammation.

At the elbow, there is scattered subcortical cystic change and mild
joint space narrowing at the olecranon. An associated elbow joint
effusion is noted, somewhat multiloculated in appearance. Would
correlate for any evidence of infection. Overlying dorsal soft
tissue swelling at the elbow may reflect associated inflammation or
may be chronic in nature.

At the wrist, note is made of scattered calcification about the
carpal rows, with surrounding soft tissue edema and fluid, likely
reflecting a mild effusion. No definite acute osseous erosions are
characterized. The carpal tunnel is grossly unremarkable in
appearance. The flexor and extensor tendons are unremarkable.

There is no evidence of fracture or dislocation. The left humeral
head remains seated at the glenoid fossa. There appears to be a
small left glenohumeral joint effusion, and degenerative change at
the left acromioclavicular joint. There is no obvious evidence of
myositis. The musculature is unremarkable in appearance.

Scattered calcification is noted at the carotid bifurcations
bilaterally, with likely moderate to severe bilateral luminal
narrowing. A 1.2 cm hypodensity within the left thyroid lobe lobe is
likely benign, given its size.

There is mild to moderate cortical volume loss, with chronic
encephalomalacia at the left temporal and occipital lobes,
reflecting remote infarct. Mild cerebellar atrophy is noted.
IMPRESSION: 1. Dilated fistula along the radial aspect of the left forearm,
demonstrating mild soft tissue stranding along the proximal aspect
of the fistula, with slight wall thickening, raising question for
mild inflammation. The fistula is otherwise grossly unremarkable,
though not fully assessed without contrast.
2. Prominent elbow joint effusion noted, somewhat multiloculated NYA
appearance. Would correlate for any evidence of infection. Overlying
dorsal soft tissue swelling at the elbow may reflect associated
inflammation or may be chronic in nature.
3. Scattered calcification about the carpal rows, with surrounding
soft tissue edema and fluid, likely reflecting a mild effusion. No
definite acute osseous erosions seen.
4. No definite evidence for myositis.
5. Scattered subcortical cystic change and mild joint space
narrowing at the olecranon.
6. Small left glenohumeral joint effusion, and degenerative change
at the left acromioclavicular joint.
7. Scattered calcification at the carotid bifurcations bilaterally,
with likely moderate to severe bilateral luminal narrowing. Carotid
ultrasound is recommended for further evaluation, when and as deemed
clinically appropriate.
8. Mild to moderate cortical volume loss noted, with chronic
encephalomalacia at the left temporal and occipital lobes,
reflecting remote infarct.

## 2015-01-22 MED ORDER — OXYCODONE-ACETAMINOPHEN 5-325 MG PO TABS
1.0000 | ORAL_TABLET | Freq: Once | ORAL | Status: AC
  Administered 2015-01-22: 1 via ORAL
  Filled 2015-01-22: qty 1

## 2015-01-22 MED ORDER — OXYCODONE-ACETAMINOPHEN 5-325 MG PO TABS: 1.0000 | ORAL_TABLET | Freq: Four times a day (QID) | ORAL | Status: DC | PRN

## 2015-01-22 NOTE — ED Notes (Signed)
Vascular at bedside performing [place]

## 2015-01-22 NOTE — ED Provider Notes (Signed)
CSN: [id number]     Arrival date & time 01/22/15  2671 History   First MD Initiated Contact with Patient 01/22/15 1848     Chief Complaint  Patient presents with  . Arm Pain     (Consider location/radiation/quality/duration/timing/severity/associated sxs/prior Treatment) HPI   Patient is a 74 year old male with end-stage renal disease. He is presenting today with pain in his left elbow. Patient had dialysis today. His fistula is working fine. He has a positive thrill. His complaining of aching pain in his left elbow.  Patient unable to point to exactly where it as he feels like it's a interior pain. Wife is concerned about a blood clot.  Patient's fistula is located in his left forearm. His pain is in his left antecubital fossa.    Past Medical History  Diagnosis Date  . End stage renal disease ([place])     hemodialysis 3 times a week  . Seasonal allergies   . Hyperlipidemia   . Anemia   . Depression   . Macular degeneration     both eyes  . GERD (gastroesophageal reflux disease)   . Hypertension   . CVA (cerebral infarction)     2004/affected left side  . Peptic ulcer     bleeding, 1969  . Kidney stones   . Renal insufficiency   . Diverticulosis   . Tubular adenoma of colon 07/2001  . Barrett's esophagus 05/2003  . Stroke [facility]) 2004  . Colon polyps   . Renal failure   . Hemodialysis patient ([place])   . Aortic stenosis 06/15/12    TEE - EF 24-58%; grade 1 diastolic dysfunction; mild/mod aortic valve stenosis; Mitral valve had calcified annulus, mild pulm htn PA peak pressure 62mmHg  . Atrial fibrillation ([place]) 04/01/10    14 day event monitor - some sinus bradycardia, PACs and sinus tachycardia  . Atrial fibrillation ([place]) 11/03/09    R/P MV - EF 66%; normal myocardial perfusion study, w/o chest pain or EKG changes for ischemia  . S/P epidural steroid injection     last  injection over 10 years ago  . Diabetes mellitus without complication [facility])    Past Surgical History   Procedure Laterality Date  . Av fistula placement  2009  . Laminectomy  1969  . Tonsillectomy  1964  . 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 Cystoscopy  several times    kidney stones  . Back surgery     Family History  Problem Relation Age of Onset  . Colon cancer Neg Hx   . Stomach cancer Mother   . Hypertension Father     Died of heart attack   Social History  Substance Use Topics  . Smoking status: Former Smoker    Quit date: 01/20/1998  . Smokeless tobacco: Never Used  . Alcohol Use: No    Review of Systems  Constitutional: Negative for fever, chills and activity change.  HENT: Negative for drooling.   Eyes: Negative for discharge.  Respiratory: Negative for cough and shortness of breath.   Cardiovascular: Negative for chest pain.  Gastrointestinal: Negative for abdominal pain.  Genitourinary: Negative for dysuria and urgency.  Musculoskeletal: Positive for myalgias. Negative for back pain, joint swelling and arthralgias.  Skin: Negative for pallor and rash.  Allergic/Immunologic: Negative for immunocompromised state.  Neurological: Negative for seizures and speech difficulty.  Psychiatric/Behavioral: Negative for behavioral problems.  All other systems reviewed and are negative.     Allergies  Codeine; Penicillins;  and Tramadol  Home Medications   Prior to Admission medications   Medication Sig Start Date End Date Taking? Authorizing Provider  acetaminophen (TYLENOL) 500 MG tablet Take 1,000 mg by mouth every 6 (six) hours as needed for pain.    Historical Provider, MD  b complex-vitamin c-folic acid (NEPHRO-VITE) 0.8 MG TABS Take 0.8 mg by mouth daily.     Historical Provider, MD  benzonatate (TESSALON) 200 MG capsule Take 1 capsule (200 mg total) by mouth 3 (three) times daily as needed for cough. 04/06/14   Ripudeep Krystal Eaton, MD  bromocriptine (PARLODEL) 5 MG capsule Take 5 mg by mouth at bedtime.  11/30/10   Historical Provider, MD  calcium acetate  (PHOSLO) 667 MG capsule Take 667 mg by mouth 3 (three) times daily with meals. Take 2 tablets each morning, 1 at mid-day and 1 each evening    Historical Provider, MD  cetirizine (ZYRTEC) 10 MG tablet Take 10 mg by mouth at bedtime.     Historical Provider, MD  cinacalcet (SENSIPAR) 30 MG tablet Take 30 mg by mouth every other day.     Historical Provider, MD  dorzolamide-timolol (COSOPT) 22.3-6.8 MG/ML ophthalmic solution Place 1 drop into the right eye 2 (two) times daily. 08/13/13   Historical Provider, MD  FLUoxetine (PROZAC) 20 MG capsule Take 40 mg by mouth 2 (two) times daily. TAKE 2 TABLETS IN THE AM. 11/30/10   Historical Provider, MD  midodrine (PROAMATINE) 10 MG tablet Take 1 tablet (10 mg total) by mouth 3 (three) times daily as needed (for systolic blood pressure less than 130). 01/17/15   Skeet Latch, MD  omeprazole (PRILOSEC) 20 MG capsule Take 1 capsule (20 mg total) by mouth 2 (two) times daily before a meal. 04/06/14   Ripudeep K Rai, MD  warfarin (COUMADIN) 2.5 MG tablet TAKE 1 & 1/2 TO 2 TABLETS BY MOUTH DAILY AS DIRECTED BY COUMADIN CLINIC Patient taking differently: Take 2.5 mg by mouth daily on Wed and Sat. Take 3.75 mg by mouth daily on Sun, Mon, Tue, Thur, and Fri in the evening. 12/26/14   Pixie Casino, MD   BP 154/87 mmHg  Pulse 50  Temp(Src) 98.1 F (36.7 C) (Oral)  Resp 20  Ht 5\' 10"  (1.778 m)  Wt 191 lb (86.637 kg)  BMI 27.41 kg/m2  SpO2 98% Physical Exam  Constitutional: He is oriented to person, place, and time. He appears well-nourished.  HENT:  Head: Normocephalic.  Mouth/Throat: Oropharynx is clear and moist.  Eyes: Conjunctivae are normal.  Neck: No tracheal deviation present.  Cardiovascular: Normal rate.   Pulmonary/Chest: Effort normal. No stridor. No respiratory distress.  Abdominal: Soft. There is no tenderness. There is no guarding.  Musculoskeletal: Normal range of motion. He exhibits no edema.  Patient has no erythema, no bruising, no  swelling. Patient has fistula which appears to have positive thrill.  Patient does not really have any point tenderness. He just vaguely says that his arm is sore.  Neurological: He is oriented to person, place, and time. No cranial nerve deficit.  Skin: Skin is warm and dry. No rash noted. He is not diaphoretic.  Psychiatric: He has a normal mood and affect. His behavior is normal.  Nursing note and vitals reviewed.   ED Course  Procedures (including critical care time) Labs Review Labs Reviewed - No data to display  Imaging Review No results found. I have personally reviewed and evaluated these images and lab results as part of my medical  decision-making.   EKG Interpretation None      MDM   Final diagnoses:  Pain, elbow  Pain    Patient 74 year old male presenting with vague left pain in his antecubital fossa. Patient's fistula seems to be operating fine. No aneurysm, no discoloration,. We will get ultrasound to make sure that there is no clot. In addition we'll get a CT to evaluate for myositis. Both these are negative we'll have plan on patient having follow-up with his regular physician.  Do not suspect septic arthritis, gout. There is no erythema on the joint. There is no pain with passive movement. Compartment syndrome unlikely given no pain with passive stretch.  I can palpate the entire area with no issues, if patient is distracted.  Sakina Briones Julio Alm, MD 01/25/15 2549

## 2015-01-22 NOTE — ED Notes (Signed)
Pt states he has all of his belongings with him at d/c

## 2015-01-22 NOTE — ED Notes (Signed)
EDP at bedside  

## 2015-01-22 NOTE — ED Notes (Signed)
Pt c/o severe left forearm pain after dialysis today; fistula in same arm; CMS intact

## 2015-01-22 NOTE — Discharge Instructions (Signed)
Please return with any redness swelling or pain to that joint of the elbow or any other joint. Please return with any fever.

## 2015-01-22 NOTE — Progress Notes (Signed)
VASCULAR LAB PRELIMINARY  PRELIMINARY  PRELIMINARY  PRELIMINARY  Duplex of left forearm AVF completed.    Preliminary report:  AVF patent.  No thrombosis noted.  Possible pseudoaneurysm at the wrist.  Dajanique Robley, RVT 01/22/2015, 8:03 PM

## 2015-01-23 ENCOUNTER — Telehealth (HOSPITAL_COMMUNITY): Payer: Self-pay

## 2015-01-23 ENCOUNTER — Encounter: Payer: Self-pay | Admitting: Vascular Surgery

## 2015-01-23 NOTE — Telephone Encounter (Signed)
Encounter complete. 

## 2015-01-24 ENCOUNTER — Ambulatory Visit (INDEPENDENT_AMBULATORY_CARE_PROVIDER_SITE_OTHER): Payer: Medicare Other | Admitting: Pharmacist Clinician (PhC)/ Clinical Pharmacy Specialist

## 2015-01-24 DIAGNOSIS — M19022 Primary osteoarthritis, left elbow: Secondary | ICD-10-CM | POA: Diagnosis not present

## 2015-01-24 DIAGNOSIS — D631 Anemia in chronic kidney disease: Secondary | ICD-10-CM | POA: Diagnosis not present

## 2015-01-24 DIAGNOSIS — Z7901 Long term (current) use of anticoagulants: Secondary | ICD-10-CM | POA: Diagnosis not present

## 2015-01-24 DIAGNOSIS — I480 Paroxysmal atrial fibrillation: Secondary | ICD-10-CM

## 2015-01-24 DIAGNOSIS — D509 Iron deficiency anemia, unspecified: Secondary | ICD-10-CM | POA: Diagnosis not present

## 2015-01-24 DIAGNOSIS — Z23 Encounter for immunization: Secondary | ICD-10-CM | POA: Diagnosis not present

## 2015-01-24 DIAGNOSIS — N2581 Secondary hyperparathyroidism of renal origin: Secondary | ICD-10-CM | POA: Diagnosis not present

## 2015-01-24 DIAGNOSIS — E1129 Type 2 diabetes mellitus with other diabetic kidney complication: Secondary | ICD-10-CM | POA: Diagnosis not present

## 2015-01-24 DIAGNOSIS — M25522 Pain in left elbow: Secondary | ICD-10-CM | POA: Diagnosis not present

## 2015-01-24 DIAGNOSIS — N186 End stage renal disease: Secondary | ICD-10-CM | POA: Diagnosis not present

## 2015-01-24 LAB — POCT INR: INR: 2.1

## 2015-01-27 DIAGNOSIS — Z23 Encounter for immunization: Secondary | ICD-10-CM | POA: Diagnosis not present

## 2015-01-27 DIAGNOSIS — N2581 Secondary hyperparathyroidism of renal origin: Secondary | ICD-10-CM | POA: Diagnosis not present

## 2015-01-27 DIAGNOSIS — D509 Iron deficiency anemia, unspecified: Secondary | ICD-10-CM | POA: Diagnosis not present

## 2015-01-27 DIAGNOSIS — D631 Anemia in chronic kidney disease: Secondary | ICD-10-CM | POA: Diagnosis not present

## 2015-01-27 DIAGNOSIS — E1129 Type 2 diabetes mellitus with other diabetic kidney complication: Secondary | ICD-10-CM | POA: Diagnosis not present

## 2015-01-27 DIAGNOSIS — Z992 Dependence on renal dialysis: Secondary | ICD-10-CM | POA: Diagnosis not present

## 2015-01-27 DIAGNOSIS — N186 End stage renal disease: Secondary | ICD-10-CM | POA: Diagnosis not present

## 2015-01-28 ENCOUNTER — Encounter: Payer: Self-pay | Admitting: Vascular Surgery

## 2015-01-28 ENCOUNTER — Ambulatory Visit (INDEPENDENT_AMBULATORY_CARE_PROVIDER_SITE_OTHER): Payer: Medicare Other | Admitting: Vascular Surgery

## 2015-01-28 ENCOUNTER — Ambulatory Visit (HOSPITAL_COMMUNITY)
Admission: RE | Admit: 2015-01-28 | Discharge: 2015-01-28 | Disposition: A | Discharge status: Home / Self Care | Payer: Medicare Other | Source: Ambulatory Visit | Attending: Internal Medicine | Admitting: Internal Medicine

## 2015-01-28 VITALS — BP 133/58 | HR 78 | Temp 98.3°F | Resp 16 | Ht 70.5 in | Wt 201.0 lb

## 2015-01-28 DIAGNOSIS — R55 Syncope and collapse: Secondary | ICD-10-CM | POA: Diagnosis not present

## 2015-01-28 DIAGNOSIS — M79602 Pain in left arm: Secondary | ICD-10-CM | POA: Diagnosis not present

## 2015-01-28 DIAGNOSIS — N186 End stage renal disease: Secondary | ICD-10-CM

## 2015-01-28 DIAGNOSIS — R9439 Abnormal result of other cardiovascular function study: Secondary | ICD-10-CM | POA: Insufficient documentation

## 2015-01-28 DIAGNOSIS — Z87891 Personal history of nicotine dependence: Secondary | ICD-10-CM | POA: Diagnosis not present

## 2015-01-28 DIAGNOSIS — R0602 Shortness of breath: Secondary | ICD-10-CM

## 2015-01-28 DIAGNOSIS — R5383 Other fatigue: Secondary | ICD-10-CM

## 2015-01-28 LAB — MYOCARDIAL PERFUSION IMAGING
CHL CUP NUCLEAR SRS: 5
CHL CUP NUCLEAR SSS: 6
LV sys vol: 42 mL
LVDIAVOL: 128 mL
NUC STRESS TID: 1.04
Peak HR: 56 {beats}/min
Rest HR: 47 {beats}/min
SDS: 1

## 2015-01-28 MED ORDER — TECHNETIUM TC 99M SESTAMIBI GENERIC - CARDIOLITE
10.7000 | Freq: Once | INTRAVENOUS | Status: AC | PRN
  Administered 2015-01-28: 10.7 via INTRAVENOUS

## 2015-01-28 MED ORDER — REGADENOSON 0.4 MG/5ML IV SOLN
0.4000 mg | Freq: Once | INTRAVENOUS | Status: AC
  Administered 2015-01-28: 0.4 mg via INTRAVENOUS

## 2015-01-28 MED ORDER — TECHNETIUM TC 99M SESTAMIBI GENERIC - CARDIOLITE
30.3000 | Freq: Once | INTRAVENOUS | Status: AC | PRN
  Administered 2015-01-28: 30.3 via INTRAVENOUS

## 2015-01-28 NOTE — Progress Notes (Signed)
Subjective:     Patient ID: Alexander [name], male   DOB: Mar 03, 1941, 74 y.o.   MRN: [id number]  HPI this 74 year old male was referred by Dr. Jamal Maes for evaluation of left arm pain. Patient has end-stage renal disease and has been on hemodialysis for 5 years. He had a left radial-cephalic AV fistula created by Dr. Oneida Alar in 2009. The fistula has never thrombosed and is always worked well. It has slowly enlarged but has never bled or had purulent drainage. He has been having pain in the left forearm up to the elbow area for the past 2 months. This is worse with any movement or rotational activity of the left arm. He denies any pain or numbness of any severity in the left hand although he has had some mild tingling at times. Symptoms are not worse while he is on hemodialysis until he starts moving the left arm following dialysis. He had an ultrasound performed at [place] a few weeks ago which revealed a well functioning fistula with dilatation of the vein. He has seen an orthopedist as well as an emergency room physician and had a "injection" in the left elbow area which improved his symptoms briefly.  Past Medical History  Diagnosis Date  . End stage renal disease ([place])     hemodialysis 3 times a week  . Seasonal allergies   . Hyperlipidemia   . Anemia   . Depression   . Macular degeneration     both eyes  . GERD (gastroesophageal reflux disease)   . Hypertension   . CVA (cerebral infarction)     2004/affected left side  . Peptic ulcer     bleeding, 1969  . Kidney stones   . Renal insufficiency   . Diverticulosis   . Tubular adenoma of colon 07/2001  . Barrett's esophagus 05/2003  . Stroke [facility]) 2004  . Colon polyps   . Renal failure   . Hemodialysis patient ([place])   . Aortic stenosis 06/15/12    TEE - EF 48-01%; grade 1 diastolic dysfunction; mild/mod aortic valve stenosis; Mitral valve had calcified annulus, mild pulm htn PA peak pressure 33mmHg  . Atrial fibrillation ([place])  04/01/10    14 day event monitor - some sinus bradycardia, PACs and sinus tachycardia  . Atrial fibrillation ([place]) 11/03/09    R/P MV - EF 66%; normal myocardial perfusion study, w/o chest pain or EKG changes for ischemia  . S/P epidural steroid injection     last  injection over 10 years ago  . Diabetes mellitus without complication [facility])     Social History  Substance Use Topics  . Smoking status: Former Smoker    Quit date: 01/20/1998  . Smokeless tobacco: Never Used  . Alcohol Use: No    Family History  Problem Relation Age of Onset  . Colon cancer Neg Hx   . Stomach cancer Mother   . Hypertension Father     Died of heart attack      Current outpatient prescriptions:  .  acetaminophen (TYLENOL) 500 MG tablet, Take 1,000 mg by mouth every 6 (six) hours as needed for pain., Disp: , Rfl:  .  b complex-vitamin c-folic acid (NEPHRO-VITE) 0.8 MG TABS, Take 0.8 mg by mouth daily. , Disp: , Rfl:  .  bromocriptine (PARLODEL) 5 MG capsule, Take 5 mg by mouth at bedtime. , Disp: , Rfl:  .  calcium acetate (PHOSLO) 667 MG capsule, Take 667 mg by mouth 3 (three) times daily  with meals. Take 2 tablets each morning, 1 at mid-day and 1 each evening, Disp: , Rfl:  .  cetirizine (ZYRTEC) 10 MG tablet, Take 10 mg by mouth at bedtime. , Disp: , Rfl:  .  cinacalcet (SENSIPAR) 30 MG tablet, Take 30 mg by mouth every other day. , Disp: , Rfl:  .  dorzolamide-timolol (COSOPT) 22.3-6.8 MG/ML ophthalmic solution, Place 1 drop into the right eye 2 (two) times daily., Disp: , Rfl:  .  FLUoxetine (PROZAC) 20 MG capsule, Take 40 mg by mouth 2 (two) times daily. , Disp: , Rfl:  .  midodrine (PROAMATINE) 10 MG tablet, Take 1 tablet (10 mg total) by mouth 3 (three) times daily as needed (for systolic blood pressure less than 130)., Disp: 90 tablet, Rfl: 5 .  omeprazole (PRILOSEC) 20 MG capsule, Take 1 capsule (20 mg total) by mouth 2 (two) times daily before a meal., Disp: 60 capsule, Rfl: 3 .   oxyCODONE-acetaminophen (PERCOCET/ROXICET) 5-325 MG tablet, Take 1 tablet by mouth every 6 (six) hours as needed for severe pain., Disp: 7 tablet, Rfl: 0 .  warfarin (COUMADIN) 2.5 MG tablet, TAKE 1 & 1/2 TO 2 TABLETS BY MOUTH DAILY AS DIRECTED BY COUMADIN CLINIC (Patient taking differently: Take 2.5 mg by mouth daily on Wed and Sat. Take 3.75 mg by mouth daily on Sun, Mon, Tue, Thur, and Fri in the evening.), Disp: 50 tablet, Rfl: 4 .  benzonatate (TESSALON) 200 MG capsule, Take 1 capsule (200 mg total) by mouth 3 (three) times daily as needed for cough. (Patient not taking: Reported on 01/22/2015), Disp: 60 capsule, Rfl: 0  Filed Vitals:   01/28/15 0908  BP: 133/58  Pulse: 78  Temp: 98.3 F (36.8 C)  Resp: 16  Height: 5' 10.5" (1.791 m)  Weight: 201 lb (91.173 kg)  SpO2: 99%    Body mass index is 28.42 kg/(m^2).         Review of Systems denies chest pain, dyspnea on exertion, PND, orthopnea, hemoptysis     Objective:   Physical Exam BP 133/58 mmHg  Pulse 78  Temp(Src) 98.3 F (36.8 C)  Resp 16  Ht 5' 10.5" (1.791 m)  Wt 201 lb (91.173 kg)  BMI 28.42 kg/m2  SpO2 99%  Gen. well-developed well-nourished male in no apparent distress alert and oriented 3 Lungs no rhonchi or wheezing Abdomen soft nontender with no palpable masses Left upper extremity with good pulse and palpable thrill and radial-cephalic AV fistula. Diffuse dilatation of the distal portion of the cephalic vein near the arterial anastomosis with no true aneurysmal formation. Fistula measures approximately 2-2-1/2 cm in diameter diffusely in the lower half of the forearm. There is no erythema or eschar formation overlying this. Patient complains of pain when the forearm is rotated and the pain is in the proximal forearm up near the elbow and proximal third of the forearm. There is no point tenderness over the fistula. Left hand is pink and well perfused.  I reviewed the report of the else sound performed last  week which reveals the fistula to be functioning well with dilatation of the vein.     Assessment:     Left arm pain not due to left radial-cephalic AV fistula. It appears to be more mechanical in nature related to rotation of the forearm with no point tenderness over the fistula and no true aneurysm formation    Plan:     Will refer patient back to Dr. Lorrene Reid for further evaluation. I  think this is more of an orthopedic or neurologic I discomfort which is not related to the fistula itself which seems to be functioning well

## 2015-01-29 DIAGNOSIS — D631 Anemia in chronic kidney disease: Secondary | ICD-10-CM | POA: Diagnosis not present

## 2015-01-29 DIAGNOSIS — N2581 Secondary hyperparathyroidism of renal origin: Secondary | ICD-10-CM | POA: Diagnosis not present

## 2015-01-29 DIAGNOSIS — N186 End stage renal disease: Secondary | ICD-10-CM | POA: Diagnosis not present

## 2015-01-29 DIAGNOSIS — E1129 Type 2 diabetes mellitus with other diabetic kidney complication: Secondary | ICD-10-CM | POA: Diagnosis not present

## 2015-01-30 ENCOUNTER — Telehealth: Payer: Self-pay | Admitting: Cardiovascular Disease

## 2015-01-30 NOTE — Telephone Encounter (Signed)
Alexander [name] is calling about a test Alexander [name] had .

## 2015-01-30 NOTE — Telephone Encounter (Signed)
INFORMED PATIENT 'S WIFE RESULT ARE FROM STRESS TEST.

## 2015-02-03 DIAGNOSIS — G894 Chronic pain syndrome: Secondary | ICD-10-CM | POA: Diagnosis not present

## 2015-02-03 DIAGNOSIS — M961 Postlaminectomy syndrome, not elsewhere classified: Secondary | ICD-10-CM | POA: Diagnosis not present

## 2015-02-03 DIAGNOSIS — M545 Low back pain: Secondary | ICD-10-CM | POA: Diagnosis not present

## 2015-02-03 DIAGNOSIS — M5127 Other intervertebral disc displacement, lumbosacral region: Secondary | ICD-10-CM | POA: Diagnosis not present

## 2015-02-03 DIAGNOSIS — M5137 Other intervertebral disc degeneration, lumbosacral region: Secondary | ICD-10-CM | POA: Diagnosis not present

## 2015-02-03 DIAGNOSIS — R1031 Right lower quadrant pain: Secondary | ICD-10-CM | POA: Diagnosis not present

## 2015-02-03 DIAGNOSIS — M4806 Spinal stenosis, lumbar region: Secondary | ICD-10-CM | POA: Diagnosis not present

## 2015-02-04 ENCOUNTER — Telehealth: Payer: Self-pay | Admitting: Pharmacist Clinician (PhC)/ Clinical Pharmacy Specialist

## 2015-02-04 DIAGNOSIS — D352 Benign neoplasm of pituitary gland: Secondary | ICD-10-CM | POA: Diagnosis not present

## 2015-02-04 DIAGNOSIS — E119 Type 2 diabetes mellitus without complications: Secondary | ICD-10-CM | POA: Diagnosis not present

## 2015-02-04 DIAGNOSIS — E789 Disorder of lipoprotein metabolism, unspecified: Secondary | ICD-10-CM | POA: Diagnosis not present

## 2015-02-04 NOTE — Telephone Encounter (Signed)
[name] is calling to speak to you about stopping Mr. Alexander [name] coumadin and he probably has to take the shots , Please call    Thanks

## 2015-02-04 NOTE — Telephone Encounter (Signed)
Received calls from both patient spouse and practice doing injection.  LMOM for practice to give [place] the date of procedure.  Will hold warfarin for 5 days and do lovenox bridge.  Like last time, will dose patient at 30 mg qd due to dialysis status.

## 2015-02-05 MED ORDER — ENOXAPARIN SODIUM 30 MG/0.3ML ~~LOC~~ SOLN
30.0000 mg | Freq: Two times a day (BID) | SUBCUTANEOUS | Status: DC
Start: 2015-02-05 — End: 2015-02-25

## 2015-02-05 NOTE — Telephone Encounter (Signed)
Patient stopped at office after dialysis today.  Plans for injection on Tuesday.  Lovenox bridge for procedure set tentatively for Tuesday Nov 15.   Pt to hold warfarin x 5 days, starting with Thursday Nov 10.  Will do lovenox injection 30 mg Saturday and Sunday AM (30 mg q24hrs).  Restart injections Wednesday 30 mg q24hrs x 5 days.  Will increase warfarin to 5 mg x 2 days (Wed/Thurs after procedure) then resume previous schedule.  INR set for [facility] Nov 21.   Spoke with Nicki Reaper at [facility] - they have set injection for Tuesday at [place]

## 2015-02-07 ENCOUNTER — Ambulatory Visit: Payer: Medicare Other | Admitting: Pharmacist Clinician (PhC)/ Clinical Pharmacy Specialist

## 2015-02-10 ENCOUNTER — Ambulatory Visit (INDEPENDENT_AMBULATORY_CARE_PROVIDER_SITE_OTHER): Payer: Medicare Other | Admitting: Pharmacist Clinician (PhC)/ Clinical Pharmacy Specialist

## 2015-02-10 DIAGNOSIS — Z7901 Long term (current) use of anticoagulants: Secondary | ICD-10-CM | POA: Diagnosis not present

## 2015-02-10 DIAGNOSIS — I480 Paroxysmal atrial fibrillation: Secondary | ICD-10-CM

## 2015-02-10 LAB — POCT INR: INR: 1.2

## 2015-02-11 DIAGNOSIS — M5417 Radiculopathy, lumbosacral region: Secondary | ICD-10-CM | POA: Diagnosis not present

## 2015-02-11 DIAGNOSIS — E221 Hyperprolactinemia: Secondary | ICD-10-CM | POA: Diagnosis not present

## 2015-02-11 DIAGNOSIS — M5137 Other intervertebral disc degeneration, lumbosacral region: Secondary | ICD-10-CM | POA: Diagnosis not present

## 2015-02-11 DIAGNOSIS — R1031 Right lower quadrant pain: Secondary | ICD-10-CM | POA: Diagnosis not present

## 2015-02-11 DIAGNOSIS — N189 Chronic kidney disease, unspecified: Secondary | ICD-10-CM | POA: Diagnosis not present

## 2015-02-11 DIAGNOSIS — M4806 Spinal stenosis, lumbar region: Secondary | ICD-10-CM | POA: Diagnosis not present

## 2015-02-11 DIAGNOSIS — M545 Low back pain: Secondary | ICD-10-CM | POA: Diagnosis not present

## 2015-02-11 DIAGNOSIS — G894 Chronic pain syndrome: Secondary | ICD-10-CM | POA: Diagnosis not present

## 2015-02-11 DIAGNOSIS — M961 Postlaminectomy syndrome, not elsewhere classified: Secondary | ICD-10-CM | POA: Diagnosis not present

## 2015-02-13 ENCOUNTER — Telehealth: Payer: Self-pay | Admitting: Cardiology

## 2015-02-13 NOTE — Telephone Encounter (Signed)
Patient would like to have PPM implant on 03/11/15. Aware that I will arrange and call back tomorrow to review instructions.

## 2015-02-13 NOTE — Telephone Encounter (Signed)
New Message   Pt wife calling to sched a procedure. Please call back and discuss.

## 2015-02-14 NOTE — Telephone Encounter (Signed)
New message    Daughter calling mom is confuse about husband needing a Psychologist, forensic.  From the last visit with Dr. Curt Bears stated he did not need pacemaker  Need some clarification on conversation from yesterday.

## 2015-02-17 ENCOUNTER — Ambulatory Visit (INDEPENDENT_AMBULATORY_CARE_PROVIDER_SITE_OTHER): Payer: Medicare Other | Admitting: Pharmacist Clinician (PhC)/ Clinical Pharmacy Specialist

## 2015-02-17 ENCOUNTER — Telehealth: Payer: Self-pay | Admitting: Cardiovascular Disease

## 2015-02-17 DIAGNOSIS — I480 Paroxysmal atrial fibrillation: Secondary | ICD-10-CM

## 2015-02-17 DIAGNOSIS — Z7901 Long term (current) use of anticoagulants: Secondary | ICD-10-CM

## 2015-02-17 LAB — POCT INR: INR: 1.7

## 2015-02-17 NOTE — Telephone Encounter (Signed)
Spoke with pt wife, bp has been running 170-200/88 for the last several days. The pt reports increased fatigue. His dry weight at dialysis has been good. Previously his bp was running real low after dialysis but now it is high and they do not know what to do. Will forward for dr Good Thunder's review

## 2015-02-17 NOTE — Telephone Encounter (Signed)
Alexander [name] is calling because Alexander [name] Bp has been running high all weekend , he does come in to see [name] this afternoon. Please call .[name]  Thanks

## 2015-02-17 NOTE — Telephone Encounter (Signed)
Stop taking midodrine.  Only take midodrine if blood pressure is <120/80.

## 2015-02-18 DIAGNOSIS — M19022 Primary osteoarthritis, left elbow: Secondary | ICD-10-CM | POA: Diagnosis not present

## 2015-02-18 MED ORDER — HYDRALAZINE HCL 25 MG PO TABS: 25.0000 mg | ORAL_TABLET | Freq: Two times a day (BID) | ORAL | Status: DC

## 2015-02-18 NOTE — Telephone Encounter (Signed)
Spoke to wife Information given. wife verbalized understanding.  Appointment is already set for 03/03/15 with [name].  Medication e sent to pharmacy. Medication comes in 25 mg dose not a 20 mg dose  Wife wanted Dr Curt Bears aware of changes  Due patient having pacemaker implant on 03/11/15 RN routed information

## 2015-02-18 NOTE — Telephone Encounter (Signed)
Please start hydralazine 20 mg bid.  Check blood pressure prior to taking hydralazine and only take it if BP is >150/90.  Do not take prior to dialysis.  OK to take it after dialysis if BP is >150/90.  Please schedule follow up with me or [name] in 1-2 weeks.

## 2015-02-18 NOTE — Telephone Encounter (Signed)
Dtr is concerned about dialysis and patient getting it on Wednesday (day after procedure is scheduled).  Informed her that since patient normally goes for dialysis early, then we will dialyze him at hospital before discharge.  Then patient can return to normal dialysis location for Friday. Explained that I would review this with Dr. Curt Bears. She also is concerned about infection with device placement. She is aware I will review these concerns with Dr. Curt Bears and let her know his recommendations/response. She understands I will call her back today.  She is ok with me leaving her a detailed message on voicemail if she does not answer when I call back.

## 2015-02-18 NOTE — Telephone Encounter (Signed)
Spoke to wife  information given Wife is still concern about elevated blood pressure. She states patient has not had midodrine for about 1 and 1/2 weeks since blood pressure elevation.  wife states blood pressure elevates even occurs on dialysis - (after)--- she did not have any particular days of readings. Informed her, will defer to Dr Oval Linsey and contact her back.

## 2015-02-18 NOTE — Telephone Encounter (Signed)
Follow up     Daughter is calling requesting a callback soon.  She need to take regarding patients need for a pacemaker.  Please call today

## 2015-02-19 DIAGNOSIS — I482 Chronic atrial fibrillation: Secondary | ICD-10-CM | POA: Diagnosis not present

## 2015-02-19 NOTE — Telephone Encounter (Signed)
Left detailed message yesterday on dtr cell phone

## 2015-02-24 ENCOUNTER — Encounter: Payer: Self-pay | Admitting: *Deleted

## 2015-02-24 ENCOUNTER — Telehealth: Payer: Self-pay | Admitting: Cardiology

## 2015-02-24 DIAGNOSIS — R001 Bradycardia, unspecified: Secondary | ICD-10-CM

## 2015-02-24 DIAGNOSIS — Z01812 Encounter for preprocedural laboratory examination: Secondary | ICD-10-CM

## 2015-02-24 NOTE — Telephone Encounter (Signed)
F/u  Pt wife returning RN phone call.

## 2015-02-24 NOTE — Telephone Encounter (Signed)
Spoke with patient's wife  (DPR on file gives permission). Discussed PPM implant instructions/time/date. Letter of instructions reviewed and left at front desk for pick up. Pre procedure labs on 12/08. Wound check on 12/22. Advised that I will review Coumadin instructions with Dr. Curt Bears and let them know. She is agreeable to above stated plan and will review instructions with patient when he gets home.

## 2015-02-25 NOTE — Telephone Encounter (Signed)
Spoke with patient's wife - instructed her to inform patient to continue taking his coumadin.  Explained that he does not need to stop it for procedure, per Dr. Curt Bears. She agreed to inform patient of Coumadin instructions for his upcoming PPM implant on 03/11/15.

## 2015-02-26 DIAGNOSIS — N186 End stage renal disease: Secondary | ICD-10-CM | POA: Diagnosis not present

## 2015-02-26 DIAGNOSIS — E1129 Type 2 diabetes mellitus with other diabetic kidney complication: Secondary | ICD-10-CM | POA: Diagnosis not present

## 2015-02-26 DIAGNOSIS — Z992 Dependence on renal dialysis: Secondary | ICD-10-CM | POA: Diagnosis not present

## 2015-02-28 DIAGNOSIS — E1129 Type 2 diabetes mellitus with other diabetic kidney complication: Secondary | ICD-10-CM | POA: Diagnosis not present

## 2015-02-28 DIAGNOSIS — N186 End stage renal disease: Secondary | ICD-10-CM | POA: Diagnosis not present

## 2015-02-28 DIAGNOSIS — N2581 Secondary hyperparathyroidism of renal origin: Secondary | ICD-10-CM | POA: Diagnosis not present

## 2015-02-28 DIAGNOSIS — D631 Anemia in chronic kidney disease: Secondary | ICD-10-CM | POA: Diagnosis not present

## 2015-03-03 ENCOUNTER — Ambulatory Visit (INDEPENDENT_AMBULATORY_CARE_PROVIDER_SITE_OTHER): Payer: Medicare Other | Admitting: Pharmacist Clinician (PhC)/ Clinical Pharmacy Specialist

## 2015-03-03 DIAGNOSIS — I480 Paroxysmal atrial fibrillation: Secondary | ICD-10-CM

## 2015-03-03 DIAGNOSIS — N2581 Secondary hyperparathyroidism of renal origin: Secondary | ICD-10-CM | POA: Diagnosis not present

## 2015-03-03 DIAGNOSIS — E1129 Type 2 diabetes mellitus with other diabetic kidney complication: Secondary | ICD-10-CM | POA: Diagnosis not present

## 2015-03-03 DIAGNOSIS — N186 End stage renal disease: Secondary | ICD-10-CM | POA: Diagnosis not present

## 2015-03-03 DIAGNOSIS — Z7901 Long term (current) use of anticoagulants: Secondary | ICD-10-CM | POA: Diagnosis not present

## 2015-03-03 DIAGNOSIS — D631 Anemia in chronic kidney disease: Secondary | ICD-10-CM | POA: Diagnosis not present

## 2015-03-03 LAB — POCT INR: INR: 2.2

## 2015-03-05 DIAGNOSIS — N186 End stage renal disease: Secondary | ICD-10-CM | POA: Diagnosis not present

## 2015-03-05 DIAGNOSIS — E1129 Type 2 diabetes mellitus with other diabetic kidney complication: Secondary | ICD-10-CM | POA: Diagnosis not present

## 2015-03-05 DIAGNOSIS — N2581 Secondary hyperparathyroidism of renal origin: Secondary | ICD-10-CM | POA: Diagnosis not present

## 2015-03-05 DIAGNOSIS — D631 Anemia in chronic kidney disease: Secondary | ICD-10-CM | POA: Diagnosis not present

## 2015-03-06 ENCOUNTER — Other Ambulatory Visit (INDEPENDENT_AMBULATORY_CARE_PROVIDER_SITE_OTHER): Payer: Medicare Other

## 2015-03-06 DIAGNOSIS — Z01812 Encounter for preprocedural laboratory examination: Secondary | ICD-10-CM

## 2015-03-06 DIAGNOSIS — R001 Bradycardia, unspecified: Secondary | ICD-10-CM

## 2015-03-06 DIAGNOSIS — I498 Other specified cardiac arrhythmias: Secondary | ICD-10-CM | POA: Diagnosis not present

## 2015-03-06 LAB — BASIC METABOLIC PANEL
BUN: 26 mg/dL — AB (ref 7–25)
CHLORIDE: 99 mmol/L (ref 98–110)
CO2: 29 mmol/L (ref 20–31)
Calcium: 9.4 mg/dL (ref 8.6–10.3)
Creat: 4.69 mg/dL — ABNORMAL HIGH (ref 0.70–1.18)
GLUCOSE: 90 mg/dL (ref 65–99)
Potassium: 3.9 mmol/L (ref 3.5–5.3)
Sodium: 138 mmol/L (ref 135–146)

## 2015-03-06 LAB — CBC WITH DIFFERENTIAL/PLATELET
Basophils Absolute: 0 10*3/uL (ref 0.0–0.1)
Basophils Relative: 0 % (ref 0–1)
EOS ABS: 0.2 10*3/uL (ref 0.0–0.7)
EOS PCT: 2 % (ref 0–5)
HEMATOCRIT: 36.9 % — AB (ref 39.0–52.0)
Hemoglobin: 12 g/dL — ABNORMAL LOW (ref 13.0–17.0)
LYMPHS ABS: 1.2 10*3/uL (ref 0.7–4.0)
Lymphocytes Relative: 14 % (ref 12–46)
MCH: 33.4 pg (ref 26.0–34.0)
MCHC: 32.5 g/dL (ref 30.0–36.0)
MCV: 102.8 fL — AB (ref 78.0–100.0)
MONO ABS: 0.5 10*3/uL (ref 0.1–1.0)
MONOS PCT: 6 % (ref 3–12)
MPV: 11 fL (ref 8.6–12.4)
Neutro Abs: 6.6 10*3/uL (ref 1.7–7.7)
Neutrophils Relative %: 78 % — ABNORMAL HIGH (ref 43–77)
PLATELETS: 180 10*3/uL (ref 150–400)
RBC: 3.59 MIL/uL — ABNORMAL LOW (ref 4.22–5.81)
RDW: 18.3 % — AB (ref 11.5–15.5)
WBC: 8.4 10*3/uL (ref 4.0–10.5)

## 2015-03-07 DIAGNOSIS — D631 Anemia in chronic kidney disease: Secondary | ICD-10-CM | POA: Diagnosis not present

## 2015-03-07 DIAGNOSIS — E1129 Type 2 diabetes mellitus with other diabetic kidney complication: Secondary | ICD-10-CM | POA: Diagnosis not present

## 2015-03-07 DIAGNOSIS — N2581 Secondary hyperparathyroidism of renal origin: Secondary | ICD-10-CM | POA: Diagnosis not present

## 2015-03-07 DIAGNOSIS — N186 End stage renal disease: Secondary | ICD-10-CM | POA: Diagnosis not present

## 2015-03-07 LAB — PROTIME-INR
INR: 1.68 — ABNORMAL HIGH (ref <1.50)
Prothrombin Time: 20 seconds — ABNORMAL HIGH (ref 11.6–15.2)

## 2015-03-10 ENCOUNTER — Telehealth: Payer: Self-pay | Admitting: Pharmacist Clinician (PhC)/ Clinical Pharmacy Specialist

## 2015-03-10 DIAGNOSIS — E1129 Type 2 diabetes mellitus with other diabetic kidney complication: Secondary | ICD-10-CM | POA: Diagnosis not present

## 2015-03-10 DIAGNOSIS — N2581 Secondary hyperparathyroidism of renal origin: Secondary | ICD-10-CM | POA: Diagnosis not present

## 2015-03-10 DIAGNOSIS — N186 End stage renal disease: Secondary | ICD-10-CM | POA: Diagnosis not present

## 2015-03-10 DIAGNOSIS — D631 Anemia in chronic kidney disease: Secondary | ICD-10-CM | POA: Diagnosis not present

## 2015-03-10 MED ORDER — VANCOMYCIN HCL IN DEXTROSE 1-5 GM/200ML-% IV SOLN
1000.0000 mg | INTRAVENOUS | Status: DC
  Filled 2015-03-10: qty 200

## 2015-03-10 MED ORDER — SODIUM CHLORIDE 0.9 % IR SOLN
80.0000 mg | Status: DC
  Filled 2015-03-10: qty 2

## 2015-03-10 NOTE — Telephone Encounter (Signed)
LMOM for pt wife to give extra 1/2 tablet of warfarin today Monday Dec 12, then continue current regimen.  Will check INR Thursday when patient in office to see [name]

## 2015-03-11 ENCOUNTER — Encounter (HOSPITAL_COMMUNITY): Payer: Self-pay | Admitting: Cardiology

## 2015-03-11 ENCOUNTER — Ambulatory Visit (HOSPITAL_COMMUNITY)
Admission: RE | Admit: 2015-03-11 | Discharge: 2015-03-12 | Disposition: A | Discharge status: Home / Self Care | Payer: Medicare Other | Source: Ambulatory Visit | Attending: Cardiology | Admitting: Cardiology

## 2015-03-11 ENCOUNTER — Encounter (HOSPITAL_COMMUNITY)
Admission: RE | Disposition: A | Discharge status: Home / Self Care | Payer: Medicare Other | Source: Ambulatory Visit | Attending: Cardiology

## 2015-03-11 DIAGNOSIS — F329 Major depressive disorder, single episode, unspecified: Secondary | ICD-10-CM | POA: Diagnosis not present

## 2015-03-11 DIAGNOSIS — N186 End stage renal disease: Secondary | ICD-10-CM | POA: Diagnosis not present

## 2015-03-11 DIAGNOSIS — Z79899 Other long term (current) drug therapy: Secondary | ICD-10-CM | POA: Diagnosis not present

## 2015-03-11 DIAGNOSIS — I4891 Unspecified atrial fibrillation: Secondary | ICD-10-CM | POA: Diagnosis not present

## 2015-03-11 DIAGNOSIS — R001 Bradycardia, unspecified: Secondary | ICD-10-CM | POA: Diagnosis not present

## 2015-03-11 DIAGNOSIS — E1122 Type 2 diabetes mellitus with diabetic chronic kidney disease: Secondary | ICD-10-CM | POA: Insufficient documentation

## 2015-03-11 DIAGNOSIS — Z992 Dependence on renal dialysis: Secondary | ICD-10-CM | POA: Insufficient documentation

## 2015-03-11 DIAGNOSIS — E785 Hyperlipidemia, unspecified: Secondary | ICD-10-CM | POA: Diagnosis not present

## 2015-03-11 DIAGNOSIS — I120 Hypertensive chronic kidney disease with stage 5 chronic kidney disease or end stage renal disease: Secondary | ICD-10-CM | POA: Diagnosis not present

## 2015-03-11 DIAGNOSIS — Z7901 Long term (current) use of anticoagulants: Secondary | ICD-10-CM | POA: Insufficient documentation

## 2015-03-11 DIAGNOSIS — H3530 Unspecified macular degeneration: Secondary | ICD-10-CM | POA: Insufficient documentation

## 2015-03-11 DIAGNOSIS — K219 Gastro-esophageal reflux disease without esophagitis: Secondary | ICD-10-CM | POA: Diagnosis not present

## 2015-03-11 DIAGNOSIS — Z8673 Personal history of transient ischemic attack (TIA), and cerebral infarction without residual deficits: Secondary | ICD-10-CM | POA: Insufficient documentation

## 2015-03-11 DIAGNOSIS — Z87891 Personal history of nicotine dependence: Secondary | ICD-10-CM | POA: Insufficient documentation

## 2015-03-11 DIAGNOSIS — I6522 Occlusion and stenosis of left carotid artery: Secondary | ICD-10-CM | POA: Diagnosis not present

## 2015-03-11 DIAGNOSIS — Z95818 Presence of other cardiac implants and grafts: Secondary | ICD-10-CM

## 2015-03-11 HISTORY — DX: Bradycardia, unspecified: R00.1

## 2015-03-11 HISTORY — PX: EP IMPLANTABLE DEVICE: SHX172B

## 2015-03-11 HISTORY — DX: Paroxysmal atrial fibrillation: I48.0

## 2015-03-11 LAB — SURGICAL PCR SCREEN
MRSA, PCR: NEGATIVE
Staphylococcus aureus: POSITIVE — AB

## 2015-03-11 LAB — PROTIME-INR
INR: 1.77 — AB (ref 0.00–1.49)
Prothrombin Time: 20.6 seconds — ABNORMAL HIGH (ref 11.6–15.2)

## 2015-03-11 SURGERY — PACEMAKER IMPLANT

## 2015-03-11 MED ORDER — SODIUM CHLORIDE 0.9 % IR SOLN
Status: AC
  Filled 2015-03-11: qty 2

## 2015-03-11 MED ORDER — FENTANYL CITRATE (PF) 100 MCG/2ML IJ SOLN
INTRAMUSCULAR | Status: DC | PRN
  Administered 2015-03-11 (×3): 25 ug via INTRAVENOUS

## 2015-03-11 MED ORDER — VANCOMYCIN HCL IN DEXTROSE 1-5 GM/200ML-% IV SOLN
1000.0000 mg | INTRAVENOUS | Status: AC
  Administered 2015-03-11: 1000 mg via INTRAVENOUS
  Filled 2015-03-11: qty 200

## 2015-03-11 MED ORDER — LIDOCAINE HCL (PF) 1 % IJ SOLN
INTRAMUSCULAR | Status: DC | PRN
  Administered 2015-03-11: 09:00:00

## 2015-03-11 MED ORDER — CALCIUM ACETATE (PHOS BINDER) 667 MG PO CAPS
667.0000 mg | ORAL_CAPSULE | Freq: Two times a day (BID) | ORAL | Status: DC
  Administered 2015-03-11 – 2015-03-12 (×3): 667 mg via ORAL
  Filled 2015-03-11 (×3): qty 1

## 2015-03-11 MED ORDER — BROMOCRIPTINE MESYLATE 2.5 MG PO TABS
5.0000 mg | ORAL_TABLET | Freq: Every day | ORAL | Status: DC
  Administered 2015-03-11: 5 mg via ORAL
  Filled 2015-03-11 (×2): qty 2

## 2015-03-11 MED ORDER — HEPARIN (PORCINE) IN NACL 2-0.9 UNIT/ML-% IJ SOLN
INTRAMUSCULAR | Status: AC
  Filled 2015-03-11: qty 500

## 2015-03-11 MED ORDER — CHLORHEXIDINE GLUCONATE 4 % EX LIQD: 60.0000 mL | Freq: Once | CUTANEOUS | Status: DC

## 2015-03-11 MED ORDER — WARFARIN - PHARMACIST DOSING INPATIENT
Freq: Every day | Status: DC
Start: 2015-03-11 — End: 2015-03-12
  Administered 2015-03-11: 18:00:00

## 2015-03-11 MED ORDER — ACETAMINOPHEN 325 MG PO TABS
325.0000 mg | ORAL_TABLET | ORAL | Status: DC | PRN
Start: 2015-03-11 — End: 2015-03-12
  Administered 2015-03-11: 650 mg via ORAL
  Filled 2015-03-11: qty 2

## 2015-03-11 MED ORDER — OXYCODONE-ACETAMINOPHEN 5-325 MG PO TABS: 1.0000 | ORAL_TABLET | Freq: Four times a day (QID) | ORAL | Status: DC | PRN

## 2015-03-11 MED ORDER — WARFARIN SODIUM 7.5 MG PO TABS
3.7500 mg | ORAL_TABLET | ORAL | Status: DC
  Administered 2015-03-11: 3.75 mg via ORAL
  Filled 2015-03-11: qty 2

## 2015-03-11 MED ORDER — WARFARIN SODIUM 2.5 MG PO TABS: 2.5000 mg | ORAL_TABLET | ORAL | Status: DC

## 2015-03-11 MED ORDER — CALCIUM ACETATE (PHOS BINDER) 667 MG PO CAPS: 1334.0000 mg | ORAL_CAPSULE | Freq: Every day | ORAL | Status: DC

## 2015-03-11 MED ORDER — DOXERCALCIFEROL 4 MCG/2ML IV SOLN
1.0000 ug | INTRAVENOUS | Status: DC
  Administered 2015-03-12: 1 ug via INTRAVENOUS
  Filled 2015-03-11: qty 2

## 2015-03-11 MED ORDER — SODIUM CHLORIDE 0.9 % IR SOLN
80.0000 mg | Status: AC
  Administered 2015-03-11: 80 mg

## 2015-03-11 MED ORDER — ACETAMINOPHEN 325 MG PO TABS: 650.0000 mg | ORAL_TABLET | Freq: Four times a day (QID) | ORAL | Status: DC | PRN

## 2015-03-11 MED ORDER — MUPIROCIN 2 % EX OINT
TOPICAL_OINTMENT | CUTANEOUS | Status: AC
  Administered 2015-03-11: 07:00:00
  Filled 2015-03-11: qty 22

## 2015-03-11 MED ORDER — RENA-VITE PO TABS
1.0000 | ORAL_TABLET | Freq: Every day | ORAL | Status: DC
  Administered 2015-03-11: 1 via ORAL
  Filled 2015-03-11: qty 1

## 2015-03-11 MED ORDER — VANCOMYCIN HCL IN DEXTROSE 1-5 GM/200ML-% IV SOLN: 1000.0000 mg | INTRAVENOUS | Status: DC

## 2015-03-11 MED ORDER — DORZOLAMIDE HCL-TIMOLOL MAL 2-0.5 % OP SOLN
1.0000 [drp] | Freq: Two times a day (BID) | OPHTHALMIC | Status: DC
  Administered 2015-03-11 – 2015-03-12 (×3): 1 [drp] via OPHTHALMIC
  Filled 2015-03-11: qty 10

## 2015-03-11 MED ORDER — FENTANYL CITRATE (PF) 100 MCG/2ML IJ SOLN
INTRAMUSCULAR | Status: AC
  Filled 2015-03-11: qty 2

## 2015-03-11 MED ORDER — SODIUM CHLORIDE 0.9 % IV SOLN
INTRAVENOUS | Status: DC
  Administered 2015-03-11: 07:00:00 via INTRAVENOUS

## 2015-03-11 MED ORDER — LIDOCAINE HCL (PF) 1 % IJ SOLN
INTRAMUSCULAR | Status: AC
  Filled 2015-03-11: qty 30

## 2015-03-11 MED ORDER — CHLORHEXIDINE GLUCONATE CLOTH 2 % EX PADS
6.0000 | MEDICATED_PAD | Freq: Every day | CUTANEOUS | Status: DC
  Administered 2015-03-11: 6 via TOPICAL

## 2015-03-11 MED ORDER — MIDAZOLAM HCL 5 MG/5ML IJ SOLN
INTRAMUSCULAR | Status: DC | PRN
  Administered 2015-03-11 (×3): 1 mg via INTRAVENOUS

## 2015-03-11 MED ORDER — PANTOPRAZOLE SODIUM 40 MG PO TBEC
40.0000 mg | DELAYED_RELEASE_TABLET | Freq: Every day | ORAL | Status: DC
  Administered 2015-03-11 – 2015-03-12 (×2): 40 mg via ORAL
  Filled 2015-03-11 (×2): qty 1

## 2015-03-11 MED ORDER — WARFARIN SODIUM 7.5 MG PO TABS: 3.7500 mg | ORAL_TABLET | Freq: Once | ORAL | Status: DC

## 2015-03-11 MED ORDER — HYDRALAZINE HCL 25 MG PO TABS
25.0000 mg | ORAL_TABLET | Freq: Two times a day (BID) | ORAL | Status: DC
  Administered 2015-03-12: 25 mg via ORAL
  Filled 2015-03-11 (×3): qty 1

## 2015-03-11 MED ORDER — MIDAZOLAM HCL 5 MG/5ML IJ SOLN
INTRAMUSCULAR | Status: AC
  Filled 2015-03-11: qty 5

## 2015-03-11 MED ORDER — CALCIUM ACETATE 667 MG PO CAPS: 667.0000 mg | ORAL_CAPSULE | Freq: Three times a day (TID) | ORAL | Status: DC

## 2015-03-11 MED ORDER — WARFARIN - PHYSICIAN DOSING INPATIENT: Freq: Every day | Status: DC

## 2015-03-11 MED ORDER — CINACALCET HCL 30 MG PO TABS
30.0000 mg | ORAL_TABLET | ORAL | Status: DC
  Administered 2015-03-12: 30 mg via ORAL
  Filled 2015-03-11: qty 1

## 2015-03-11 MED ORDER — ONDANSETRON HCL 4 MG/2ML IJ SOLN: 4.0000 mg | Freq: Four times a day (QID) | INTRAMUSCULAR | Status: DC | PRN

## 2015-03-11 MED ORDER — LORATADINE 10 MG PO TABS
10.0000 mg | ORAL_TABLET | Freq: Every day | ORAL | Status: DC
  Administered 2015-03-11 – 2015-03-12 (×2): 10 mg via ORAL
  Filled 2015-03-11 (×2): qty 1

## 2015-03-11 MED ORDER — MUPIROCIN 2 % EX OINT: 1.0000 "application " | TOPICAL_OINTMENT | Freq: Once | CUTANEOUS | Status: DC

## 2015-03-11 MED ORDER — FLUOXETINE HCL 20 MG PO CAPS
40.0000 mg | ORAL_CAPSULE | Freq: Every day | ORAL | Status: DC
  Administered 2015-03-11 – 2015-03-12 (×2): 40 mg via ORAL
  Filled 2015-03-11 (×2): qty 2

## 2015-03-11 MED ORDER — MUPIROCIN 2 % EX OINT
1.0000 "application " | TOPICAL_OINTMENT | Freq: Two times a day (BID) | CUTANEOUS | Status: DC
  Administered 2015-03-11 – 2015-03-12 (×2): 1 via NASAL
  Filled 2015-03-11: qty 22

## 2015-03-11 SURGICAL SUPPLY — 7 items
CABLE SURGICAL S-101-97-12 (CABLE) ×2 IMPLANT
LEAD TENDRIL SDX 2088TC-46CM (Lead) ×2 IMPLANT
LEAD TENDRIL SDX 2088TC-52CM (Lead) ×2 IMPLANT
PAD DEFIB LIFELINK (PAD) ×2 IMPLANT
PPM ASSURITY DR PM2240 (Pacemaker) ×2 IMPLANT
SHEATH CLASSIC 7F (SHEATH) ×4 IMPLANT
TRAY PACEMAKER INSERTION (PACKS) ×2 IMPLANT

## 2015-03-11 NOTE — Progress Notes (Signed)
Called Energy manager to re-assess patient.  RN Ronalee Belts and I checked right chest PPM site. Site soft, swollen without bruising, and no subcutaneous air felt. Amber to check on patient later. Pt resting with call bell within reach.  Will continue to monitor. Payton Emerald, RN

## 2015-03-11 NOTE — Discharge Instructions (Signed)
° ° °  Supplemental Discharge Instructions for  Pacemaker/Defibrillator Patients  Activity No heavy lifting or vigorous activity with your left/right arm for 6 to 8 weeks.  Do not raise your left/right arm above your head for one week.  Gradually raise your affected arm as drawn below.           __       03-15-15                03-16-15                 03-17-15               03-18-15  NO DRIVING for 1 week      WOUND CARE - Keep the wound area clean and dry.  Do not get this area wet for one week. No showers for one week; you may shower on   03/18/15  . - The tape/steri-strips on your wound will fall off; do not pull them off.  No bandage is needed on the site.  DO  NOT apply any creams, oils, or ointments to the wound area. - If you notice any drainage or discharge from the wound, any swelling or bruising at the site, or you develop a fever > 101? F after you are discharged home, call the office at once.  Special Instructions - You are still able to use cellular telephones; use the ear opposite the side where you have your pacemaker/defibrillator.  Avoid carrying your cellular phone near your device. - When traveling through airports, show security personnel your identification card to avoid being screened in the metal detectors.  Ask the security personnel to use the hand wand. - Avoid arc welding equipment, MRI testing (magnetic resonance imaging), TENS units (transcutaneous nerve stimulators).  Call the office for questions about other devices. - Avoid electrical appliances that are in poor condition or are not properly grounded. - Microwave ovens are safe to be near or to operate.

## 2015-03-11 NOTE — H&P (Addendum)
ID: Alexander [name], DOB 03/13/1941, MRN [id number]  PCP: Horatio Pel, MD Cardiologist: Jacinto Reap Primary Electrophysiologist: Rachid Parham Meredith [name], MD   Chief Complaint  Patient presents with  . Bradycardia    History of Present Illness: Alexander [name] is a 75 y.o. male who presents today for electrophysiology evaluation. He has a history of moderate aortic stenosis, ESRD on HD, diabetes mellitus, hyperlipidemia, carotid artery disease with complete occlusion of the L carotid, prior TIA and atrial fibrillation. He was having issues with hypotension but his dry weight was increased and he has not been having issues with BP. He continues to feel lethargic and has had HR in the 40s. He continues to be bradycardiac at times.  Presents today for pacemaker placement.   Past Medical History  Diagnosis Date  . End stage renal disease     hemodialysis 3 times a week  . Seasonal allergies   . Hyperlipidemia   . Anemia   . Depression   . Macular degeneration     both eyes  . GERD (gastroesophageal reflux disease)   . Hypertension   . CVA (cerebral infarction)     2004/affected left side  . Peptic ulcer     bleeding, 1969  . Kidney stones   . Renal insufficiency   . Diverticulosis   . Tubular adenoma of colon 07/2001  . Barrett's esophagus 05/2003  . Stroke 2004  . Colon polyps   . Renal failure   . Hemodialysis patient   . Aortic stenosis 06/15/12    TEE - EF 0000000; grade 1 diastolic dysfunction; mild/mod aortic valve stenosis; Mitral valve had calcified annulus, mild pulm htn PA peak pressure 39mmHg  . Atrial fibrillation 04/01/10    14 day event monitor - some sinus bradycardia, PACs and sinus tachycardia  . Atrial fibrillation 11/03/09    R/P MV - EF 66%; normal myocardial perfusion study, w/o chest pain or EKG changes for ischemia  . S/P epidural  steroid injection     last injection over 10 years ago  . Diabetes mellitus without complication    Past Surgical History  Procedure Laterality Date  . Av fistula placement  2009  . Laminectomy  1969  . Tonsillectomy  1964  . 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 Cystoscopy  several times    kidney stones  . Back surgery       Current Outpatient Prescriptions  Medication Sig Dispense Refill  . acetaminophen (TYLENOL) 500 MG tablet Take 1,000 mg by mouth every 6 (six) hours as needed for pain.    [name] amiodarone (PACERONE) 200 MG tablet Take 2 tablets (400 mg total) by mouth daily. 180 tablet 2  . atorvastatin (LIPITOR) 40 MG tablet Take 20 mg by mouth daily.    [name] b complex-vitamin c-folic acid (NEPHRO-VITE) 0.8 MG TABS Take 0.8 mg by mouth daily.     . benzonatate (TESSALON) 200 MG capsule Take 1 capsule (200 mg total) by mouth 3 (three) times daily as needed for cough. 60 capsule 0  . bromocriptine (PARLODEL) 5 MG capsule Take 5 mg by mouth at bedtime.     . calcium acetate (PHOSLO) 667 MG capsule Take 667 mg by mouth 3 (three) times daily with meals. Take 2 tablets each morning, 1 at mid-day and 1 each evening    . cetirizine (ZYRTEC) 10 MG tablet Take 10 mg by mouth daily.    . cinacalcet (SENSIPAR) 30 MG tablet Take 30 mg  by mouth every other day.     . cromolyn (OPTICROM) 4 % ophthalmic solution Place 1 drop into both eyes 4 (four) times daily.  6  . dorzolamide-timolol (COSOPT) 22.3-6.8 MG/ML ophthalmic solution Place 1 drop into the right eye 2 (two) times daily.    [name] FLUoxetine (PROZAC) 20 MG capsule Take 40 mg by mouth 2 (two) times daily. TAKE 2 TABLETS IN THE AM.    . midodrine (PROAMATINE) 10 MG tablet Take 1 tablet (10 mg total) by mouth 3 (three) times daily as needed. 90 tablet 5  . omeprazole (PRILOSEC) 20 MG capsule Take 1 capsule (20 mg total) by mouth  2 (two) times daily before a meal. 60 capsule 3  . warfarin (COUMADIN) 2.5 MG tablet Take 1.5 to 2 tablets by mouth daily as directed by coumadin clinic (Patient taking differently: Take 3.75-5 mg by mouth daily. Takes 2 tablets (5 mg) on Mondays and Fridays. Takes 1.5 tablets (3.75 mg) on all remaining days.) 50 tablet 4   No current facility-administered medications for this visit.    Allergies: Codeine; Penicillins; and Tramadol   Social History: The patient  reports that he quit smoking about 16 years ago. He has never used smokeless tobacco. He reports that he does not drink alcohol or use illicit drugs.   Family History: The patient's family history includes Hypertension in his father; Stomach cancer in his mother. There is no history of Colon cancer.    ROS: Please see the history of present illness. Otherwise, review of systems is positive for presyncope, falls, syncope, fatigue. All other systems are reviewed and negative.    PHYSICAL EXAM: VS: BP 152/76 mmHg  Pulse 49  Ht 5' 10.5" (1.791 m)  Wt 201 lb 3.2 oz (91.264 kg)  BMI 28.45 kg/m2 , BMI Body mass index is 28.45 kg/(m^2). GEN: Well nourished, well developed, in no acute distress  HEENT: normal  Neck: no JVD, carotid bruits, or masses Cardiac: RRR; no murmurs, rubs, or gallops,no edema  Respiratory: clear to auscultation bilaterally, normal work of breathing GI: soft, nontender, nondistended, + BS MS: no deformity or atrophy  Skin: warm and dry, fistula left forearm Neuro: Strength and sensation are intact Psych: euthymic mood, full affect  EKG: EKG is ordered today. The ekg ordered today shows sinus bradycardia rate 49     Recent Labs: 04/04/2014: B Natriuretic Peptide 571.9* 07/05/2014: TSH 2.069 11/18/2014: ALT 65*; BUN 12; Creatinine, Ser 4.37*; Hemoglobin 10.4*; Magnesium 1.9; Platelets 198; Potassium 3.5; Sodium 139    Lipid Panel   Labs (Brief)       Component Value  Date/Time   CHOL 104 04/04/2014 0843   TRIG 68 04/04/2014 0843   HDL 35* 04/04/2014 0843   CHOLHDL 3.0 04/04/2014 0843   VLDL 14 04/04/2014 0843   LDLCALC 55 04/04/2014 0843       Wt Readings from Last 3 Encounters:  12/18/14 201 lb 3.2 oz (91.264 kg)  12/05/14 205 lb (92.987 kg)  11/18/14 214 lb 1.1 oz (97.1 kg)      11/28/14 TTE: Study Conclusions  - Left ventricle: The cavity size was normal. Systolic function was normal. The estimated ejection fraction was in the range of 60% to 65%. Wall motion was normal; there were no regional wall motion abnormalities. Features are consistent with a pseudonormal left ventricular filling pattern, with concomitant abnormal relaxation and increased filling pressure (grade 2 diastolic dysfunction). Doppler parameters are consistent with high ventricular filling pressure. - Aortic valve: Severe  diffuse thickening and calcification. There was moderate stenosis. VTI ratio of LVOT to aortic valve: 0.31. Valve area (VTI): 1.27 cm^2. Indexed valve area (VTI): 0.59 cm^2/m^2. Mean velocity ratio of LVOT to aortic valve: 0.31. Valve area (Vmean): 1.29 cm^2. Indexed valve area (Vmean): 0.6 cm^2/m^2.  Mean gradient (S): 22 mm Hg. - Mitral valve: Severely calcified annulus. There was mild regurgitation. - Left atrium: The atrium was mildly dilated. - Right ventricle: The cavity size was mildly dilated. Wall thickness was normal. - Tricuspid valve: There was trivial regurgitation. - Pulmonic valve: There was trivial regurgitation. - Pulmonary arteries: PA peak pressure: 37 mm Hg (S).   ASSESSMENT AND PLAN:  1. Atrial fibrillation: on amiodarone and warfarin. Has CHADS2VASc of 4. His LFTs were found to be high and his amiodarone was held at that time.  He was seen by GI and has been followed by them.  Currently, he is continuing to have fatigue.  Marcelles Clinard plan on placing pacemaker today for  bradycardia.  Explained the risks and benefits of the procedure.  Risks include bleeding, tamponade, pneumothroax.  He does have a higher risk of infection as he is a dialysis patient.  Told the patient that he has a 3-5% infection risk.  Marquee Fuchs 03/11/2015 7:16 AM

## 2015-03-11 NOTE — Discharge Summary (Signed)
ELECTROPHYSIOLOGY PROCEDURE DISCHARGE SUMMARY    Patient ID: Alexander [name],  MRN: [id number], DOB/AGE: 1941-01-18 74 y.o.  Admit date: 03/11/2015 Discharge date: 03/12/2015  Primary Care Physician: Horatio Pel, MD Primary Cardiologist: Oval Linsey Electrophysiologist: Curt Bears  Primary Discharge Diagnosis:  Symptomatic bradycardia status post pacemaker implantation this admission  Secondary Discharge Diagnosis:  1.  Paroxysmal atrial fibrillation 2.  ESRD on HD 3.  CVA 4.  Hyperlipidemia 5.  Diabetes  Allergies  Allergen Reactions  . Codeine Nausea Only  . Penicillins Swelling      . Tramadol Nausea Only     Procedures This Admission:  1.  Implantation of a STJ daul chamber PPM on 03-11-15 by Dr Curt Bears.  The patient received a STJ model number Assurity PPM with model number 2088 right atrial lead and 2088 right ventricular lead. There were no immediate post procedure complications. 2.  CXR on 03-12-15 demonstrated no pneumothorax status post device implantation.   Brief HPI: Alexander [name] is a 74 y.o. male was referred to electrophysiology in the outpatient setting for consideration of PPM implantation.  Past medical history is as outlined above.  The patient has had symptomatic bradycardia without reversible causes identified.  Risks, benefits, and alternatives to PPM implantation were reviewed with the patient who wished to proceed.   Hospital Course:  The patient was admitted and underwent implantation of a STJ dual chamber pacemaker with details as outlined above.  He was monitored on telemetry overnight which demonstrated AP with intrinsic ventricular conduction.  Right chest was without hematoma or ecchymosis.  The device was interrogated and found to be functioning normally.  CXR was obtained and demonstrated no pneumothorax status post device implantation.  Wound care, arm mobility, and restrictions were reviewed with the patient.  The patient was  examined and considered stable for discharge to home after dialysis.   LFT"s were elevated on amiodarone, medication was discontinued. Repeat LFT's this admission demonstrated normalization.    Physical Exam: Filed Vitals:   03/12/15 0551 03/12/15 0720 03/12/15 0730 03/12/15 0800  BP: 147/65 182/95 172/85 176/82  Pulse: 68 64 66 65  Temp: 97.7 F (36.5 C) 98.7 F (37.1 C)    TempSrc: Oral Oral    Resp: 18 18    Height:      Weight:  199 lb 4.7 oz (90.4 kg)    SpO2: 100%       GEN- The patient is well appearing, alert and oriented x 3 today.   HEENT: normocephalic, atraumatic; sclera clear, conjunctiva pink; hearing intact; oropharynx clear; neck supple  Lungs- Clear to ausculation bilaterally, normal work of breathing.  No wheezes, rales, rhonchi Heart- Regular rate and rhythm, no murmurs, rubs or gallops  GI- soft, non-tender, non-distended, bowel sounds present  Extremities- no clubbing, cyanosis, or edema; DP/PT/radial pulses 2+ bilaterally MS- no significant deformity or atrophy Skin- warm and dry, no rash or lesion, right chest without hematoma/ecchymosis Psych- euthymic mood, full affect Neuro- strength and sensation are intact   Labs:   Lab Results  Component Value Date   WBC 8.4 03/06/2015   HGB 12.0* 03/06/2015   HCT 36.9* 03/06/2015   MCV 102.8* 03/06/2015   PLT 180 03/06/2015     Recent Labs Lab 03/06/15 1102 03/12/15 0337  NA 138  --   K 3.9  --   CL 99  --   CO2 29  --   BUN 26*  --   CREATININE 4.69*  --   CALCIUM  9.4  --   PROT  --  5.7*  BILITOT  --  1.0  ALKPHOS  --  56  ALT  --  19  AST  --  38  GLUCOSE 90  --     Discharge Medications:    Medication List    TAKE these medications        acetaminophen 500 MG tablet  Commonly known as:  TYLENOL  Take 1,000 mg by mouth every 6 (six) hours as needed for pain.     b complex-vitamin c-folic acid 0.8 MG Tabs tablet  Take 0.8 mg by mouth daily.     bromocriptine 5 MG capsule    Commonly known as:  PARLODEL  Take 5 mg by mouth at bedtime.     calcium acetate 667 MG capsule  Commonly known as:  PHOSLO  Take 667 mg by mouth 3 (three) times daily with meals. Take 2 tablets each morning, 1 at mid-day and 1 each evening     cetirizine 10 MG tablet  Commonly known as:  ZYRTEC  Take 10 mg by mouth at bedtime.     cinacalcet 30 MG tablet  Commonly known as:  SENSIPAR  Take 30 mg by mouth every other day.     dorzolamide-timolol 22.3-6.8 MG/ML ophthalmic solution  Commonly known as:  COSOPT  Place 1 drop into the right eye 2 (two) times daily.     FLUoxetine 20 MG capsule  Commonly known as:  PROZAC  Take 40 mg by mouth daily.     hydrALAZINE 25 MG tablet  Commonly known as:  APRESOLINE  Take 1 tablet (25 mg total) by mouth 2 (two) times daily. Take if blood pressure greater than 150/90     midodrine 10 MG tablet  Commonly known as:  PROAMATINE  Take 1 tablet (10 mg total) by mouth 3 (three) times daily as needed (for systolic blood pressure less than 130).     omeprazole 20 MG capsule  Commonly known as:  PRILOSEC  Take 1 capsule (20 mg total) by mouth 2 (two) times daily before a meal.     oxyCODONE-acetaminophen 5-325 MG tablet  Commonly known as:  PERCOCET/ROXICET  Take 1 tablet by mouth every 6 (six) hours as needed for severe pain.     warfarin 2.5 MG tablet  Commonly known as:  COUMADIN  TAKE 1 & 1/2 TO 2 TABLETS BY MOUTH DAILY AS DIRECTED BY COUMADIN CLINIC        Disposition:  Discharge Instructions    Diet - low sodium heart healthy    Complete by:  As directed      Increase activity slowly    Complete by:  As directed           Follow-up Information    Follow up with [facility] On 03/20/2015.   Specialty:  Cardiology   Why:  at [facility] information:   [address] [place] [phone]      Duration of Discharge Encounter: Greater than 30 minutes  including physician time.  Signed, Chanetta Marshall, NP 03/12/2015 8:02 AM   I have seen and examined this patient with Chanetta Marshall.  Agree with above, note added to reflect my findings.  On exam, regular rhythm, no murmurs.  Device without issues on check this AM, no complication on Xray.  Had HD while in the hospital.  Khrystal Jeanmarie plan for discharge today with follow up  in device clinic in 10 days.    Kosha Jaquith M. Kailo Kosik MD 03/12/2015 2:47 PM

## 2015-03-11 NOTE — Progress Notes (Signed)
ANTICOAGULATION CONSULT NOTE - Initial Consult  Pharmacy Consult for Warfarin ` Indication: atrial fibrillation   Patient Measurements: Height: 5' 10.5" (179.1 cm) Weight: 200 lb (90.719 kg) IBW/kg (Calculated) : 74.15   Vital Signs:zx Temp: 97.7 F (36.5 C) (12/13 0600) Temp Source: Oral (12/13 0600) BP: 161/71 mmHg (12/13 0948) Pulse Rate: 68 (12/13 0948)  Labs:  Recent Labs  03/11/15 0634  LABPROT 20.6*  INR 1.77*    Estimated Creatinine Clearance: 15.8 mL/min (by C-G formula based on Cr of 4.69).   Medical History: Past Medical History  Diagnosis Date  . End stage renal disease ([place])     hemodialysis 3 times a week  . Seasonal allergies   . Hyperlipidemia   . Anemia   . Depression   . Macular degeneration     both eyes  . GERD (gastroesophageal reflux disease)   . Hypertension   . CVA (cerebral infarction)     2004/affected left side  . Peptic ulcer     bleeding, 1969  . Kidney stones   . Renal insufficiency   . Diverticulosis   . Tubular adenoma of colon 07/2001  . Barrett's esophagus 05/2003  . Stroke [facility]) 2004  . Colon polyps   . Renal failure   . Hemodialysis patient ([place])   . Aortic stenosis 06/15/12    TEE - EF 0000000; grade 1 diastolic dysfunction; mild/mod aortic valve stenosis; Mitral valve had calcified annulus, mild pulm htn PA peak pressure 34mmHg  . Atrial fibrillation ([place]) 04/01/10    14 day event monitor - some sinus bradycardia, PACs and sinus tachycardia  . Atrial fibrillation ([place]) 11/03/09    R/P MV - EF 66%; normal myocardial perfusion study, w/o chest pain or EKG changes for ischemia  . S/P epidural steroid injection     last  injection over 10 years ago  . Diabetes mellitus without complication ([place])       Assessment: Alexander [name] admit for pacer d/t low HR 40s.  On warfarin PTA for Afib admit INR 1.77 after boost of 5mg  by CVRR 12/12(INR 1.68), CBC stable.  Home dose 3.75mg  QD/2.5mg  Wed,Sat - continue home dose expect INR to  continue to rise with boost- amio d/c in oct but should already be seeing this effect - will watch closely and adjust accordingly.   Goal of Therapy:  INR 2-3 Monitor platelets by anticoagulation protocol: Yes   Plan:  Continue home dose warfarin 2.5mg  Wed, Sat / 3.75mg  all other days Daily protime  Bonnita Nasuti Pharm.D. CPP, BCPS Clinical Pharmacist [phone] 03/11/2015 11:36 AM

## 2015-03-12 ENCOUNTER — Ambulatory Visit (HOSPITAL_COMMUNITY): Payer: Medicare Other

## 2015-03-12 DIAGNOSIS — I120 Hypertensive chronic kidney disease with stage 5 chronic kidney disease or end stage renal disease: Secondary | ICD-10-CM | POA: Diagnosis not present

## 2015-03-12 DIAGNOSIS — N186 End stage renal disease: Secondary | ICD-10-CM | POA: Diagnosis not present

## 2015-03-12 DIAGNOSIS — E1122 Type 2 diabetes mellitus with diabetic chronic kidney disease: Secondary | ICD-10-CM | POA: Diagnosis not present

## 2015-03-12 DIAGNOSIS — I4891 Unspecified atrial fibrillation: Secondary | ICD-10-CM | POA: Diagnosis not present

## 2015-03-12 DIAGNOSIS — R001 Bradycardia, unspecified: Secondary | ICD-10-CM | POA: Diagnosis not present

## 2015-03-12 DIAGNOSIS — Z950 Presence of cardiac pacemaker: Secondary | ICD-10-CM | POA: Diagnosis not present

## 2015-03-12 DIAGNOSIS — Z992 Dependence on renal dialysis: Secondary | ICD-10-CM | POA: Diagnosis not present

## 2015-03-12 LAB — BASIC METABOLIC PANEL
Anion gap: 10 (ref 5–15)
BUN: 36 mg/dL — ABNORMAL HIGH (ref 6–20)
CO2: 25 mmol/L (ref 22–32)
Calcium: 8.4 mg/dL — ABNORMAL LOW (ref 8.9–10.3)
Chloride: 103 mmol/L (ref 101–111)
Creatinine, Ser: 7.01 mg/dL — ABNORMAL HIGH (ref 0.61–1.24)
GFR calc Af Amer: 8 mL/min — ABNORMAL LOW (ref >60)
GFR calc non Af Amer: 7 mL/min — ABNORMAL LOW (ref >60)
Glucose, Bld: 85 mg/dL (ref 65–99)
Potassium: 4 mmol/L (ref 3.5–5.1)
Sodium: 138 mmol/L (ref 135–145)

## 2015-03-12 LAB — HEPATIC FUNCTION PANEL
ALBUMIN: 2.8 g/dL — AB (ref 3.5–5.0)
ALT: 19 U/L (ref 17–63)
AST: 38 U/L (ref 15–41)
Alkaline Phosphatase: 56 U/L (ref 38–126)
BILIRUBIN DIRECT: 0.4 mg/dL (ref 0.1–0.5)
Indirect Bilirubin: 0.6 mg/dL (ref 0.3–0.9)
Total Bilirubin: 1 mg/dL (ref 0.3–1.2)
Total Protein: 5.7 g/dL — ABNORMAL LOW (ref 6.5–8.1)

## 2015-03-12 LAB — PROTIME-INR
INR: 2.13 — AB (ref 0.00–1.49)
PROTHROMBIN TIME: 23.7 s — AB (ref 11.6–15.2)

## 2015-03-12 MED ORDER — SODIUM CHLORIDE 0.9 % IV SOLN: 100.0000 mL | INTRAVENOUS | Status: DC | PRN

## 2015-03-12 MED ORDER — LIDOCAINE-PRILOCAINE 2.5-2.5 % EX CREA: 1.0000 "application " | TOPICAL_CREAM | CUTANEOUS | Status: DC | PRN

## 2015-03-12 MED ORDER — HEPARIN SODIUM (PORCINE) 1000 UNIT/ML DIALYSIS: 1000.0000 [IU] | INTRAMUSCULAR | Status: DC | PRN

## 2015-03-12 MED ORDER — DOXERCALCIFEROL 4 MCG/2ML IV SOLN
INTRAVENOUS | Status: AC
  Filled 2015-03-12: qty 2

## 2015-03-12 MED ORDER — PENTAFLUOROPROP-TETRAFLUOROETH EX AERO: 1.0000 "application " | INHALATION_SPRAY | CUTANEOUS | Status: DC | PRN

## 2015-03-12 MED ORDER — LIDOCAINE HCL (PF) 1 % IJ SOLN: 5.0000 mL | INTRAMUSCULAR | Status: DC | PRN

## 2015-03-12 MED ORDER — ALTEPLASE 2 MG IJ SOLR: 2.0000 mg | Freq: Once | INTRAMUSCULAR | Status: DC | PRN

## 2015-03-12 MED ORDER — HEPARIN SODIUM (PORCINE) 1000 UNIT/ML DIALYSIS
20.0000 [IU]/kg | INTRAMUSCULAR | Status: DC | PRN
  Administered 2015-03-12: 1800 [IU] via INTRAVENOUS_CENTRAL

## 2015-03-12 MED FILL — Lidocaine HCl Local Preservative Free (PF) Inj 1%: INTRAMUSCULAR | Qty: 30 | Status: AC

## 2015-03-12 NOTE — Progress Notes (Signed)
Pt seen on HD.  Had pacemaker placement yesterday by Dr/ Curt Bears. Stayed overnight in outpatient bed and dialyzed this am.  No heparin . Only 0.4 above EDW but not in street clothes so goal challenged. BP 177/81 Qb 400 Will keep outpt EDW the same. INR 2.13 K 4 - use 4 K today.   Amalia Hailey, PA-C

## 2015-03-12 NOTE — Progress Notes (Signed)
03/12/2015 12:48 PM Discharge AVS meds taken today and those due this evening reviewed.  Follow-up appointments and when to call md reviewed.  D/C IV and TELE.  Questions and concerns addressed.   D/C home per orders. [place]

## 2015-03-12 NOTE — Progress Notes (Signed)
ANTICOAGULATION CONSULT NOTE - Follow Up Consult  Pharmacy Consult for Warfarin Indication: atrial fibrillation  Patient Measurements: Height: 5' 10.5" (179.1 cm) Weight: 199 lb 4.7 oz (90.4 kg) IBW/kg (Calculated) : 74.15   Vital Signs: Temp: 98.7 F (37.1 C) (12/14 0720) Temp Source: Oral (12/14 0720) BP: 178/78 mmHg (12/14 0900) Pulse Rate: 65 (12/14 0900)  Labs:  Recent Labs  03/11/15 0634 03/12/15 0337 03/12/15 0730  LABPROT 20.6* 23.7*  --   INR 1.77* 2.13*  --   CREATININE  --   --  7.01*    Estimated Creatinine Clearance: 10.6 mL/min (by C-G formula based on Cr of 7.01).   Medications:  Scheduled:  . bromocriptine  5 mg Oral QHS  . calcium acetate  1,334 mg Oral Q breakfast   And  . calcium acetate  667 mg Oral BID AC  . Chlorhexidine Gluconate Cloth  6 each Topical Daily  . cinacalcet  30 mg Oral QODAY  . dorzolamide-timolol  1 drop Right Eye BID  . doxercalciferol  1 mcg Intravenous Q M,W,F-HD  . FLUoxetine  40 mg Oral Daily  . hydrALAZINE  25 mg Oral BID  . loratadine  10 mg Oral Daily  . multivitamin  1 tablet Oral QHS  . mupirocin ointment  1 application Nasal BID  . pantoprazole  40 mg Oral Daily  . warfarin  2.5 mg Oral Once per day on Wed Sat  . warfarin  3.75 mg Oral Once per day on Sun Mon Tue Thu Fri  . Warfarin - Pharmacist Dosing Inpatient   Does not apply q1800   Infusions:   PRN: sodium chloride, sodium chloride, acetaminophen, acetaminophen, alteplase, heparin, heparin, lidocaine (PF), lidocaine-prilocaine, ondansetron (ZOFRAN) IV, oxyCODONE-acetaminophen, pentafluoroprop-tetrafluoroeth  Assessment: 74yom admit for pacer d/t low HR 40s. On warfarin PTA for Afib admit INR 1.77 after boost of 5mg  by CVRR 12/12(INR 1.68), CBC stable.  Home dose 3.75mg  QD/2.5mg  Wed,Sat - continue home dose expect INR to continue to rise with boost- amio d/c in oct but should already be seeing this effect - will watch closely and adjust accordingly.    INR therapeutic at 2.13 today.  No s/sx bleeding noted  Goal of Therapy:  INR 2-3 Monitor platelets by anticoagulation protocol: Yes   Plan:  Continue home dose warfarin 2.5mg  Wed, Sat / 3.75mg  all other days Daily INR Monitor for s/sx of bleeding  Jaymond Waage E Sadye Kiernan 03/12/2015,9:24 AM

## 2015-03-13 ENCOUNTER — Ambulatory Visit (INDEPENDENT_AMBULATORY_CARE_PROVIDER_SITE_OTHER): Payer: Medicare Other | Admitting: Pharmacist Clinician (PhC)/ Clinical Pharmacy Specialist

## 2015-03-13 ENCOUNTER — Encounter: Payer: Medicare Other | Admitting: Pharmacist Clinician (PhC)/ Clinical Pharmacy Specialist

## 2015-03-13 ENCOUNTER — Ambulatory Visit: Payer: Medicare Other | Admitting: Cardiovascular Disease

## 2015-03-13 DIAGNOSIS — I480 Paroxysmal atrial fibrillation: Secondary | ICD-10-CM | POA: Diagnosis not present

## 2015-03-13 DIAGNOSIS — Z7901 Long term (current) use of anticoagulants: Secondary | ICD-10-CM | POA: Diagnosis not present

## 2015-03-13 LAB — POCT INR: INR: 2.3

## 2015-03-14 DIAGNOSIS — D631 Anemia in chronic kidney disease: Secondary | ICD-10-CM | POA: Diagnosis not present

## 2015-03-14 DIAGNOSIS — N186 End stage renal disease: Secondary | ICD-10-CM | POA: Diagnosis not present

## 2015-03-14 DIAGNOSIS — N2581 Secondary hyperparathyroidism of renal origin: Secondary | ICD-10-CM | POA: Diagnosis not present

## 2015-03-14 DIAGNOSIS — E1129 Type 2 diabetes mellitus with other diabetic kidney complication: Secondary | ICD-10-CM | POA: Diagnosis not present

## 2015-03-17 DIAGNOSIS — N2581 Secondary hyperparathyroidism of renal origin: Secondary | ICD-10-CM | POA: Diagnosis not present

## 2015-03-17 DIAGNOSIS — E1129 Type 2 diabetes mellitus with other diabetic kidney complication: Secondary | ICD-10-CM | POA: Diagnosis not present

## 2015-03-17 DIAGNOSIS — N186 End stage renal disease: Secondary | ICD-10-CM | POA: Diagnosis not present

## 2015-03-17 DIAGNOSIS — D631 Anemia in chronic kidney disease: Secondary | ICD-10-CM | POA: Diagnosis not present

## 2015-03-19 DIAGNOSIS — I482 Chronic atrial fibrillation: Secondary | ICD-10-CM | POA: Diagnosis not present

## 2015-03-19 DIAGNOSIS — D631 Anemia in chronic kidney disease: Secondary | ICD-10-CM | POA: Diagnosis not present

## 2015-03-19 DIAGNOSIS — N2581 Secondary hyperparathyroidism of renal origin: Secondary | ICD-10-CM | POA: Diagnosis not present

## 2015-03-19 DIAGNOSIS — N186 End stage renal disease: Secondary | ICD-10-CM | POA: Diagnosis not present

## 2015-03-19 DIAGNOSIS — E1129 Type 2 diabetes mellitus with other diabetic kidney complication: Secondary | ICD-10-CM | POA: Diagnosis not present

## 2015-03-20 ENCOUNTER — Ambulatory Visit (INDEPENDENT_AMBULATORY_CARE_PROVIDER_SITE_OTHER): Payer: Medicare Other | Admitting: *Deleted

## 2015-03-20 ENCOUNTER — Encounter: Payer: Self-pay | Admitting: Cardiology

## 2015-03-20 DIAGNOSIS — I480 Paroxysmal atrial fibrillation: Secondary | ICD-10-CM

## 2015-03-20 DIAGNOSIS — R001 Bradycardia, unspecified: Secondary | ICD-10-CM | POA: Diagnosis not present

## 2015-03-20 LAB — CUP PACEART INCLINIC DEVICE CHECK
Brady Statistic RA Percent Paced: 87 %
Implantable Lead Implant Date: 20161213
Implantable Lead Location: 753859
Implantable Lead Location: 753860
Lead Channel Impedance Value: 412.5 Ohm
Lead Channel Impedance Value: 487.5 Ohm
Lead Channel Pacing Threshold Amplitude: 0.625 V
Lead Channel Pacing Threshold Pulse Width: 0.5 ms
Lead Channel Pacing Threshold Pulse Width: 0.5 ms
Lead Channel Sensing Intrinsic Amplitude: 12 mV
Lead Channel Sensing Intrinsic Amplitude: 2.1 mV
Lead Channel Setting Pacing Pulse Width: 0.5 ms
MDC IDC LEAD IMPLANT DT: 20161213
MDC IDC MSMT BATTERY REMAINING LONGEVITY: 80.4
MDC IDC MSMT BATTERY VOLTAGE: 3.05 V
MDC IDC MSMT LEADCHNL RA PACING THRESHOLD AMPLITUDE: 1 V
MDC IDC MSMT LEADCHNL RA PACING THRESHOLD AMPLITUDE: 1 V
MDC IDC MSMT LEADCHNL RA PACING THRESHOLD PULSEWIDTH: 0.5 ms
MDC IDC SESS DTM: 20161222094103
MDC IDC SET LEADCHNL RA PACING AMPLITUDE: 3.5 V
MDC IDC SET LEADCHNL RV PACING AMPLITUDE: 0.875
MDC IDC SET LEADCHNL RV SENSING SENSITIVITY: 2 mV
MDC IDC STAT BRADY RV PERCENT PACED: 0.73 %
Pulse Gen Model: 2240
Pulse Gen Serial Number: 7839215

## 2015-03-20 NOTE — Progress Notes (Signed)
Wound check appointment. Steri-strips removed. Wound without redness. Incision edges approximated, wound well healed. Small hematoma is present + warfarin. Wound evaluated by Dr.Camnitz---no recommended changes, patient encouraged to call if site becomes larger.   Normal device function. Thresholds, sensing, and impedances consistent with implant measurements. Device programmed at 3.5V (RA)/ RV auto capture programmed on at implant for extra safety margin until 3 month visit. Histogram distribution appropriate for patient and level of activity. (2) mode switches (<1%)---max dur. 10 sec, Max A 210---sensor per EGM. No high ventricular rates noted. Patient educated about wound care, arm mobility, lifting restrictions. ROV in 3 months with WC.

## 2015-03-21 DIAGNOSIS — E1129 Type 2 diabetes mellitus with other diabetic kidney complication: Secondary | ICD-10-CM | POA: Diagnosis not present

## 2015-03-21 DIAGNOSIS — D631 Anemia in chronic kidney disease: Secondary | ICD-10-CM | POA: Diagnosis not present

## 2015-03-21 DIAGNOSIS — N2581 Secondary hyperparathyroidism of renal origin: Secondary | ICD-10-CM | POA: Diagnosis not present

## 2015-03-21 DIAGNOSIS — N186 End stage renal disease: Secondary | ICD-10-CM | POA: Diagnosis not present

## 2015-03-24 DIAGNOSIS — N2581 Secondary hyperparathyroidism of renal origin: Secondary | ICD-10-CM | POA: Diagnosis not present

## 2015-03-24 DIAGNOSIS — D631 Anemia in chronic kidney disease: Secondary | ICD-10-CM | POA: Diagnosis not present

## 2015-03-24 DIAGNOSIS — E1129 Type 2 diabetes mellitus with other diabetic kidney complication: Secondary | ICD-10-CM | POA: Diagnosis not present

## 2015-03-24 DIAGNOSIS — N186 End stage renal disease: Secondary | ICD-10-CM | POA: Diagnosis not present

## 2015-03-25 ENCOUNTER — Ambulatory Visit: Payer: Medicare Other | Admitting: Cardiology

## 2015-03-25 DIAGNOSIS — M19022 Primary osteoarthritis, left elbow: Secondary | ICD-10-CM | POA: Diagnosis not present

## 2015-03-26 ENCOUNTER — Encounter: Payer: Medicare Other | Admitting: Pharmacist Clinician (PhC)/ Clinical Pharmacy Specialist

## 2015-03-26 DIAGNOSIS — D631 Anemia in chronic kidney disease: Secondary | ICD-10-CM | POA: Diagnosis not present

## 2015-03-26 DIAGNOSIS — E1129 Type 2 diabetes mellitus with other diabetic kidney complication: Secondary | ICD-10-CM | POA: Diagnosis not present

## 2015-03-26 DIAGNOSIS — N186 End stage renal disease: Secondary | ICD-10-CM | POA: Diagnosis not present

## 2015-03-26 DIAGNOSIS — N2581 Secondary hyperparathyroidism of renal origin: Secondary | ICD-10-CM | POA: Diagnosis not present

## 2015-03-28 DIAGNOSIS — D631 Anemia in chronic kidney disease: Secondary | ICD-10-CM | POA: Diagnosis not present

## 2015-03-28 DIAGNOSIS — E1129 Type 2 diabetes mellitus with other diabetic kidney complication: Secondary | ICD-10-CM | POA: Diagnosis not present

## 2015-03-28 DIAGNOSIS — N186 End stage renal disease: Secondary | ICD-10-CM | POA: Diagnosis not present

## 2015-03-28 DIAGNOSIS — N2581 Secondary hyperparathyroidism of renal origin: Secondary | ICD-10-CM | POA: Diagnosis not present

## 2015-03-29 DIAGNOSIS — E1129 Type 2 diabetes mellitus with other diabetic kidney complication: Secondary | ICD-10-CM | POA: Diagnosis not present

## 2015-03-29 DIAGNOSIS — Z992 Dependence on renal dialysis: Secondary | ICD-10-CM | POA: Diagnosis not present

## 2015-03-29 DIAGNOSIS — N186 End stage renal disease: Secondary | ICD-10-CM | POA: Diagnosis not present

## 2015-03-30 DIAGNOSIS — R001 Bradycardia, unspecified: Secondary | ICD-10-CM | POA: Insufficient documentation

## 2015-03-30 HISTORY — DX: Bradycardia, unspecified: R00.1

## 2015-03-31 DIAGNOSIS — N186 End stage renal disease: Secondary | ICD-10-CM | POA: Diagnosis not present

## 2015-03-31 DIAGNOSIS — N2581 Secondary hyperparathyroidism of renal origin: Secondary | ICD-10-CM | POA: Diagnosis not present

## 2015-03-31 DIAGNOSIS — E1129 Type 2 diabetes mellitus with other diabetic kidney complication: Secondary | ICD-10-CM | POA: Diagnosis not present

## 2015-03-31 DIAGNOSIS — D631 Anemia in chronic kidney disease: Secondary | ICD-10-CM | POA: Diagnosis not present

## 2015-04-02 DIAGNOSIS — E1129 Type 2 diabetes mellitus with other diabetic kidney complication: Secondary | ICD-10-CM | POA: Diagnosis not present

## 2015-04-02 DIAGNOSIS — N2581 Secondary hyperparathyroidism of renal origin: Secondary | ICD-10-CM | POA: Diagnosis not present

## 2015-04-02 DIAGNOSIS — N186 End stage renal disease: Secondary | ICD-10-CM | POA: Diagnosis not present

## 2015-04-02 DIAGNOSIS — D631 Anemia in chronic kidney disease: Secondary | ICD-10-CM | POA: Diagnosis not present

## 2015-04-03 DIAGNOSIS — Z125 Encounter for screening for malignant neoplasm of prostate: Secondary | ICD-10-CM | POA: Diagnosis not present

## 2015-04-03 DIAGNOSIS — Z7982 Long term (current) use of aspirin: Secondary | ICD-10-CM | POA: Diagnosis not present

## 2015-04-03 DIAGNOSIS — Z23 Encounter for immunization: Secondary | ICD-10-CM | POA: Diagnosis not present

## 2015-04-03 DIAGNOSIS — E119 Type 2 diabetes mellitus without complications: Secondary | ICD-10-CM | POA: Diagnosis not present

## 2015-04-03 DIAGNOSIS — N390 Urinary tract infection, site not specified: Secondary | ICD-10-CM | POA: Diagnosis not present

## 2015-04-03 DIAGNOSIS — Z0000 Encounter for general adult medical examination without abnormal findings: Secondary | ICD-10-CM | POA: Diagnosis not present

## 2015-04-04 DIAGNOSIS — N2581 Secondary hyperparathyroidism of renal origin: Secondary | ICD-10-CM | POA: Diagnosis not present

## 2015-04-04 DIAGNOSIS — N186 End stage renal disease: Secondary | ICD-10-CM | POA: Diagnosis not present

## 2015-04-04 DIAGNOSIS — D631 Anemia in chronic kidney disease: Secondary | ICD-10-CM | POA: Diagnosis not present

## 2015-04-04 DIAGNOSIS — E1129 Type 2 diabetes mellitus with other diabetic kidney complication: Secondary | ICD-10-CM | POA: Diagnosis not present

## 2015-04-07 DIAGNOSIS — N2581 Secondary hyperparathyroidism of renal origin: Secondary | ICD-10-CM | POA: Diagnosis not present

## 2015-04-07 DIAGNOSIS — E1129 Type 2 diabetes mellitus with other diabetic kidney complication: Secondary | ICD-10-CM | POA: Diagnosis not present

## 2015-04-07 DIAGNOSIS — D631 Anemia in chronic kidney disease: Secondary | ICD-10-CM | POA: Diagnosis not present

## 2015-04-07 DIAGNOSIS — N186 End stage renal disease: Secondary | ICD-10-CM | POA: Diagnosis not present

## 2015-04-08 DIAGNOSIS — Z7982 Long term (current) use of aspirin: Secondary | ICD-10-CM | POA: Diagnosis not present

## 2015-04-08 DIAGNOSIS — J449 Chronic obstructive pulmonary disease, unspecified: Secondary | ICD-10-CM | POA: Diagnosis not present

## 2015-04-08 DIAGNOSIS — N186 End stage renal disease: Secondary | ICD-10-CM | POA: Diagnosis not present

## 2015-04-08 DIAGNOSIS — Z1212 Encounter for screening for malignant neoplasm of rectum: Secondary | ICD-10-CM | POA: Diagnosis not present

## 2015-04-08 DIAGNOSIS — E789 Disorder of lipoprotein metabolism, unspecified: Secondary | ICD-10-CM | POA: Diagnosis not present

## 2015-04-09 ENCOUNTER — Ambulatory Visit (INDEPENDENT_AMBULATORY_CARE_PROVIDER_SITE_OTHER): Payer: Medicare Other | Admitting: Pharmacist Clinician (PhC)/ Clinical Pharmacy Specialist

## 2015-04-09 DIAGNOSIS — Z7901 Long term (current) use of anticoagulants: Secondary | ICD-10-CM

## 2015-04-09 DIAGNOSIS — I480 Paroxysmal atrial fibrillation: Secondary | ICD-10-CM | POA: Diagnosis not present

## 2015-04-09 DIAGNOSIS — E1129 Type 2 diabetes mellitus with other diabetic kidney complication: Secondary | ICD-10-CM | POA: Diagnosis not present

## 2015-04-09 DIAGNOSIS — N2581 Secondary hyperparathyroidism of renal origin: Secondary | ICD-10-CM | POA: Diagnosis not present

## 2015-04-09 DIAGNOSIS — D631 Anemia in chronic kidney disease: Secondary | ICD-10-CM | POA: Diagnosis not present

## 2015-04-09 DIAGNOSIS — N186 End stage renal disease: Secondary | ICD-10-CM | POA: Diagnosis not present

## 2015-04-09 LAB — POCT INR: INR: 2

## 2015-04-11 ENCOUNTER — Encounter: Payer: Self-pay | Admitting: Cardiovascular Disease

## 2015-04-11 DIAGNOSIS — E1129 Type 2 diabetes mellitus with other diabetic kidney complication: Secondary | ICD-10-CM | POA: Diagnosis not present

## 2015-04-11 DIAGNOSIS — D631 Anemia in chronic kidney disease: Secondary | ICD-10-CM | POA: Diagnosis not present

## 2015-04-11 DIAGNOSIS — N186 End stage renal disease: Secondary | ICD-10-CM | POA: Diagnosis not present

## 2015-04-11 DIAGNOSIS — N2581 Secondary hyperparathyroidism of renal origin: Secondary | ICD-10-CM | POA: Diagnosis not present

## 2015-04-14 DIAGNOSIS — D631 Anemia in chronic kidney disease: Secondary | ICD-10-CM | POA: Diagnosis not present

## 2015-04-14 DIAGNOSIS — N2581 Secondary hyperparathyroidism of renal origin: Secondary | ICD-10-CM | POA: Diagnosis not present

## 2015-04-14 DIAGNOSIS — E1129 Type 2 diabetes mellitus with other diabetic kidney complication: Secondary | ICD-10-CM | POA: Diagnosis not present

## 2015-04-14 DIAGNOSIS — N186 End stage renal disease: Secondary | ICD-10-CM | POA: Diagnosis not present

## 2015-04-16 DIAGNOSIS — E1129 Type 2 diabetes mellitus with other diabetic kidney complication: Secondary | ICD-10-CM | POA: Diagnosis not present

## 2015-04-16 DIAGNOSIS — D631 Anemia in chronic kidney disease: Secondary | ICD-10-CM | POA: Diagnosis not present

## 2015-04-16 DIAGNOSIS — N186 End stage renal disease: Secondary | ICD-10-CM | POA: Diagnosis not present

## 2015-04-16 DIAGNOSIS — N2581 Secondary hyperparathyroidism of renal origin: Secondary | ICD-10-CM | POA: Diagnosis not present

## 2015-04-16 DIAGNOSIS — Z1212 Encounter for screening for malignant neoplasm of rectum: Secondary | ICD-10-CM | POA: Diagnosis not present

## 2015-04-18 DIAGNOSIS — D631 Anemia in chronic kidney disease: Secondary | ICD-10-CM | POA: Diagnosis not present

## 2015-04-18 DIAGNOSIS — N2581 Secondary hyperparathyroidism of renal origin: Secondary | ICD-10-CM | POA: Diagnosis not present

## 2015-04-18 DIAGNOSIS — N186 End stage renal disease: Secondary | ICD-10-CM | POA: Diagnosis not present

## 2015-04-18 DIAGNOSIS — E1129 Type 2 diabetes mellitus with other diabetic kidney complication: Secondary | ICD-10-CM | POA: Diagnosis not present

## 2015-04-21 DIAGNOSIS — N186 End stage renal disease: Secondary | ICD-10-CM | POA: Diagnosis not present

## 2015-04-21 DIAGNOSIS — E1129 Type 2 diabetes mellitus with other diabetic kidney complication: Secondary | ICD-10-CM | POA: Diagnosis not present

## 2015-04-21 DIAGNOSIS — D631 Anemia in chronic kidney disease: Secondary | ICD-10-CM | POA: Diagnosis not present

## 2015-04-21 DIAGNOSIS — N2581 Secondary hyperparathyroidism of renal origin: Secondary | ICD-10-CM | POA: Diagnosis not present

## 2015-04-22 DIAGNOSIS — M19022 Primary osteoarthritis, left elbow: Secondary | ICD-10-CM | POA: Diagnosis not present

## 2015-04-23 DIAGNOSIS — N186 End stage renal disease: Secondary | ICD-10-CM | POA: Diagnosis not present

## 2015-04-23 DIAGNOSIS — I482 Chronic atrial fibrillation: Secondary | ICD-10-CM | POA: Diagnosis not present

## 2015-04-23 DIAGNOSIS — N2581 Secondary hyperparathyroidism of renal origin: Secondary | ICD-10-CM | POA: Diagnosis not present

## 2015-04-23 DIAGNOSIS — E1129 Type 2 diabetes mellitus with other diabetic kidney complication: Secondary | ICD-10-CM | POA: Diagnosis not present

## 2015-04-23 DIAGNOSIS — D631 Anemia in chronic kidney disease: Secondary | ICD-10-CM | POA: Diagnosis not present

## 2015-04-23 LAB — PROTIME-INR: INR: 1.4 — AB (ref 0.9–1.1)

## 2015-04-24 ENCOUNTER — Ambulatory Visit (INDEPENDENT_AMBULATORY_CARE_PROVIDER_SITE_OTHER): Payer: Medicare Other | Admitting: Pharmacist Clinician (PhC)/ Clinical Pharmacy Specialist

## 2015-04-24 DIAGNOSIS — I480 Paroxysmal atrial fibrillation: Secondary | ICD-10-CM

## 2015-04-24 DIAGNOSIS — Z7901 Long term (current) use of anticoagulants: Secondary | ICD-10-CM

## 2015-04-25 DIAGNOSIS — E1129 Type 2 diabetes mellitus with other diabetic kidney complication: Secondary | ICD-10-CM | POA: Diagnosis not present

## 2015-04-25 DIAGNOSIS — D631 Anemia in chronic kidney disease: Secondary | ICD-10-CM | POA: Diagnosis not present

## 2015-04-25 DIAGNOSIS — N2581 Secondary hyperparathyroidism of renal origin: Secondary | ICD-10-CM | POA: Diagnosis not present

## 2015-04-25 DIAGNOSIS — N186 End stage renal disease: Secondary | ICD-10-CM | POA: Diagnosis not present

## 2015-04-28 DIAGNOSIS — E1129 Type 2 diabetes mellitus with other diabetic kidney complication: Secondary | ICD-10-CM | POA: Diagnosis not present

## 2015-04-28 DIAGNOSIS — N2581 Secondary hyperparathyroidism of renal origin: Secondary | ICD-10-CM | POA: Diagnosis not present

## 2015-04-28 DIAGNOSIS — N186 End stage renal disease: Secondary | ICD-10-CM | POA: Diagnosis not present

## 2015-04-28 DIAGNOSIS — D631 Anemia in chronic kidney disease: Secondary | ICD-10-CM | POA: Diagnosis not present

## 2015-04-29 DIAGNOSIS — N186 End stage renal disease: Secondary | ICD-10-CM | POA: Diagnosis not present

## 2015-04-29 DIAGNOSIS — Z992 Dependence on renal dialysis: Secondary | ICD-10-CM | POA: Diagnosis not present

## 2015-04-29 DIAGNOSIS — E1129 Type 2 diabetes mellitus with other diabetic kidney complication: Secondary | ICD-10-CM | POA: Diagnosis not present

## 2015-04-30 DIAGNOSIS — N186 End stage renal disease: Secondary | ICD-10-CM | POA: Diagnosis not present

## 2015-04-30 DIAGNOSIS — E1129 Type 2 diabetes mellitus with other diabetic kidney complication: Secondary | ICD-10-CM | POA: Diagnosis not present

## 2015-04-30 DIAGNOSIS — N2581 Secondary hyperparathyroidism of renal origin: Secondary | ICD-10-CM | POA: Diagnosis not present

## 2015-05-02 DIAGNOSIS — N2581 Secondary hyperparathyroidism of renal origin: Secondary | ICD-10-CM | POA: Diagnosis not present

## 2015-05-02 DIAGNOSIS — E1129 Type 2 diabetes mellitus with other diabetic kidney complication: Secondary | ICD-10-CM | POA: Diagnosis not present

## 2015-05-02 DIAGNOSIS — N186 End stage renal disease: Secondary | ICD-10-CM | POA: Diagnosis not present

## 2015-05-05 DIAGNOSIS — N186 End stage renal disease: Secondary | ICD-10-CM | POA: Diagnosis not present

## 2015-05-05 DIAGNOSIS — N2581 Secondary hyperparathyroidism of renal origin: Secondary | ICD-10-CM | POA: Diagnosis not present

## 2015-05-05 DIAGNOSIS — E1129 Type 2 diabetes mellitus with other diabetic kidney complication: Secondary | ICD-10-CM | POA: Diagnosis not present

## 2015-05-07 ENCOUNTER — Ambulatory Visit (INDEPENDENT_AMBULATORY_CARE_PROVIDER_SITE_OTHER): Payer: Medicare Other | Admitting: Pharmacist Clinician (PhC)/ Clinical Pharmacy Specialist

## 2015-05-07 DIAGNOSIS — Z7901 Long term (current) use of anticoagulants: Secondary | ICD-10-CM

## 2015-05-07 DIAGNOSIS — N2581 Secondary hyperparathyroidism of renal origin: Secondary | ICD-10-CM | POA: Diagnosis not present

## 2015-05-07 DIAGNOSIS — E1129 Type 2 diabetes mellitus with other diabetic kidney complication: Secondary | ICD-10-CM | POA: Diagnosis not present

## 2015-05-07 DIAGNOSIS — I480 Paroxysmal atrial fibrillation: Secondary | ICD-10-CM | POA: Diagnosis not present

## 2015-05-07 DIAGNOSIS — N186 End stage renal disease: Secondary | ICD-10-CM | POA: Diagnosis not present

## 2015-05-07 LAB — POCT INR: INR: 1.6

## 2015-05-09 DIAGNOSIS — N2581 Secondary hyperparathyroidism of renal origin: Secondary | ICD-10-CM | POA: Diagnosis not present

## 2015-05-09 DIAGNOSIS — N186 End stage renal disease: Secondary | ICD-10-CM | POA: Diagnosis not present

## 2015-05-12 DIAGNOSIS — N186 End stage renal disease: Secondary | ICD-10-CM | POA: Diagnosis not present

## 2015-05-12 DIAGNOSIS — N2581 Secondary hyperparathyroidism of renal origin: Secondary | ICD-10-CM | POA: Diagnosis not present

## 2015-05-12 DIAGNOSIS — E1129 Type 2 diabetes mellitus with other diabetic kidney complication: Secondary | ICD-10-CM | POA: Diagnosis not present

## 2015-05-14 DIAGNOSIS — N2581 Secondary hyperparathyroidism of renal origin: Secondary | ICD-10-CM | POA: Diagnosis not present

## 2015-05-14 DIAGNOSIS — N186 End stage renal disease: Secondary | ICD-10-CM | POA: Diagnosis not present

## 2015-05-14 DIAGNOSIS — E1129 Type 2 diabetes mellitus with other diabetic kidney complication: Secondary | ICD-10-CM | POA: Diagnosis not present

## 2015-05-16 DIAGNOSIS — E1129 Type 2 diabetes mellitus with other diabetic kidney complication: Secondary | ICD-10-CM | POA: Diagnosis not present

## 2015-05-16 DIAGNOSIS — N2581 Secondary hyperparathyroidism of renal origin: Secondary | ICD-10-CM | POA: Diagnosis not present

## 2015-05-16 DIAGNOSIS — N186 End stage renal disease: Secondary | ICD-10-CM | POA: Diagnosis not present

## 2015-05-19 DIAGNOSIS — E1129 Type 2 diabetes mellitus with other diabetic kidney complication: Secondary | ICD-10-CM | POA: Diagnosis not present

## 2015-05-19 DIAGNOSIS — N186 End stage renal disease: Secondary | ICD-10-CM | POA: Diagnosis not present

## 2015-05-19 DIAGNOSIS — N2581 Secondary hyperparathyroidism of renal origin: Secondary | ICD-10-CM | POA: Diagnosis not present

## 2015-05-21 ENCOUNTER — Ambulatory Visit (INDEPENDENT_AMBULATORY_CARE_PROVIDER_SITE_OTHER): Payer: Medicare Other | Admitting: Pharmacist Clinician (PhC)/ Clinical Pharmacy Specialist

## 2015-05-21 DIAGNOSIS — N186 End stage renal disease: Secondary | ICD-10-CM | POA: Diagnosis not present

## 2015-05-21 DIAGNOSIS — I480 Paroxysmal atrial fibrillation: Secondary | ICD-10-CM | POA: Diagnosis not present

## 2015-05-21 DIAGNOSIS — I482 Chronic atrial fibrillation: Secondary | ICD-10-CM | POA: Diagnosis not present

## 2015-05-21 DIAGNOSIS — Z7901 Long term (current) use of anticoagulants: Secondary | ICD-10-CM | POA: Diagnosis not present

## 2015-05-21 DIAGNOSIS — N2581 Secondary hyperparathyroidism of renal origin: Secondary | ICD-10-CM | POA: Diagnosis not present

## 2015-05-21 DIAGNOSIS — E1129 Type 2 diabetes mellitus with other diabetic kidney complication: Secondary | ICD-10-CM | POA: Diagnosis not present

## 2015-05-21 LAB — POCT INR: INR: 1.8

## 2015-05-23 DIAGNOSIS — N186 End stage renal disease: Secondary | ICD-10-CM | POA: Diagnosis not present

## 2015-05-23 DIAGNOSIS — E1129 Type 2 diabetes mellitus with other diabetic kidney complication: Secondary | ICD-10-CM | POA: Diagnosis not present

## 2015-05-23 DIAGNOSIS — N2581 Secondary hyperparathyroidism of renal origin: Secondary | ICD-10-CM | POA: Diagnosis not present

## 2015-05-26 DIAGNOSIS — E1129 Type 2 diabetes mellitus with other diabetic kidney complication: Secondary | ICD-10-CM | POA: Diagnosis not present

## 2015-05-26 DIAGNOSIS — N2581 Secondary hyperparathyroidism of renal origin: Secondary | ICD-10-CM | POA: Diagnosis not present

## 2015-05-26 DIAGNOSIS — N186 End stage renal disease: Secondary | ICD-10-CM | POA: Diagnosis not present

## 2015-05-27 DIAGNOSIS — E1129 Type 2 diabetes mellitus with other diabetic kidney complication: Secondary | ICD-10-CM | POA: Diagnosis not present

## 2015-05-27 DIAGNOSIS — Z992 Dependence on renal dialysis: Secondary | ICD-10-CM | POA: Diagnosis not present

## 2015-05-27 DIAGNOSIS — N186 End stage renal disease: Secondary | ICD-10-CM | POA: Diagnosis not present

## 2015-05-28 DIAGNOSIS — N186 End stage renal disease: Secondary | ICD-10-CM | POA: Diagnosis not present

## 2015-05-28 DIAGNOSIS — E1129 Type 2 diabetes mellitus with other diabetic kidney complication: Secondary | ICD-10-CM | POA: Diagnosis not present

## 2015-05-28 DIAGNOSIS — D631 Anemia in chronic kidney disease: Secondary | ICD-10-CM | POA: Diagnosis not present

## 2015-05-30 DIAGNOSIS — D631 Anemia in chronic kidney disease: Secondary | ICD-10-CM | POA: Diagnosis not present

## 2015-05-30 DIAGNOSIS — N186 End stage renal disease: Secondary | ICD-10-CM | POA: Diagnosis not present

## 2015-05-30 DIAGNOSIS — E1129 Type 2 diabetes mellitus with other diabetic kidney complication: Secondary | ICD-10-CM | POA: Diagnosis not present

## 2015-06-02 ENCOUNTER — Ambulatory Visit (INDEPENDENT_AMBULATORY_CARE_PROVIDER_SITE_OTHER): Payer: Medicare Other | Admitting: Pharmacist Clinician (PhC)/ Clinical Pharmacy Specialist

## 2015-06-02 DIAGNOSIS — I480 Paroxysmal atrial fibrillation: Secondary | ICD-10-CM | POA: Diagnosis not present

## 2015-06-02 DIAGNOSIS — D631 Anemia in chronic kidney disease: Secondary | ICD-10-CM | POA: Diagnosis not present

## 2015-06-02 DIAGNOSIS — Z7901 Long term (current) use of anticoagulants: Secondary | ICD-10-CM | POA: Diagnosis not present

## 2015-06-02 DIAGNOSIS — N186 End stage renal disease: Secondary | ICD-10-CM | POA: Diagnosis not present

## 2015-06-02 DIAGNOSIS — E1129 Type 2 diabetes mellitus with other diabetic kidney complication: Secondary | ICD-10-CM | POA: Diagnosis not present

## 2015-06-02 LAB — POCT INR: INR: 1.8

## 2015-06-04 DIAGNOSIS — N186 End stage renal disease: Secondary | ICD-10-CM | POA: Diagnosis not present

## 2015-06-04 DIAGNOSIS — E1129 Type 2 diabetes mellitus with other diabetic kidney complication: Secondary | ICD-10-CM | POA: Diagnosis not present

## 2015-06-04 DIAGNOSIS — D631 Anemia in chronic kidney disease: Secondary | ICD-10-CM | POA: Diagnosis not present

## 2015-06-06 DIAGNOSIS — D631 Anemia in chronic kidney disease: Secondary | ICD-10-CM | POA: Diagnosis not present

## 2015-06-06 DIAGNOSIS — N186 End stage renal disease: Secondary | ICD-10-CM | POA: Diagnosis not present

## 2015-06-06 DIAGNOSIS — E1129 Type 2 diabetes mellitus with other diabetic kidney complication: Secondary | ICD-10-CM | POA: Diagnosis not present

## 2015-06-09 DIAGNOSIS — N186 End stage renal disease: Secondary | ICD-10-CM | POA: Diagnosis not present

## 2015-06-09 DIAGNOSIS — D631 Anemia in chronic kidney disease: Secondary | ICD-10-CM | POA: Diagnosis not present

## 2015-06-09 DIAGNOSIS — E1129 Type 2 diabetes mellitus with other diabetic kidney complication: Secondary | ICD-10-CM | POA: Diagnosis not present

## 2015-06-10 DIAGNOSIS — H353290 Exudative age-related macular degeneration, unspecified eye, stage unspecified: Secondary | ICD-10-CM | POA: Diagnosis not present

## 2015-06-10 DIAGNOSIS — H548 Legal blindness, as defined in USA: Secondary | ICD-10-CM | POA: Diagnosis not present

## 2015-06-10 DIAGNOSIS — H40022 Open angle with borderline findings, high risk, left eye: Secondary | ICD-10-CM | POA: Diagnosis not present

## 2015-06-10 DIAGNOSIS — H542 Low vision, both eyes: Secondary | ICD-10-CM | POA: Diagnosis not present

## 2015-06-10 DIAGNOSIS — H401133 Primary open-angle glaucoma, bilateral, severe stage: Secondary | ICD-10-CM | POA: Diagnosis not present

## 2015-06-11 DIAGNOSIS — E1129 Type 2 diabetes mellitus with other diabetic kidney complication: Secondary | ICD-10-CM | POA: Diagnosis not present

## 2015-06-11 DIAGNOSIS — N186 End stage renal disease: Secondary | ICD-10-CM | POA: Diagnosis not present

## 2015-06-11 DIAGNOSIS — D631 Anemia in chronic kidney disease: Secondary | ICD-10-CM | POA: Diagnosis not present

## 2015-06-13 DIAGNOSIS — D631 Anemia in chronic kidney disease: Secondary | ICD-10-CM | POA: Diagnosis not present

## 2015-06-13 DIAGNOSIS — N186 End stage renal disease: Secondary | ICD-10-CM | POA: Diagnosis not present

## 2015-06-13 DIAGNOSIS — E1129 Type 2 diabetes mellitus with other diabetic kidney complication: Secondary | ICD-10-CM | POA: Diagnosis not present

## 2015-06-16 ENCOUNTER — Ambulatory Visit (INDEPENDENT_AMBULATORY_CARE_PROVIDER_SITE_OTHER): Payer: Medicare Other | Admitting: Cardiology

## 2015-06-16 ENCOUNTER — Ambulatory Visit (INDEPENDENT_AMBULATORY_CARE_PROVIDER_SITE_OTHER): Payer: Medicare Other | Admitting: Pharmacist Clinician (PhC)/ Clinical Pharmacy Specialist

## 2015-06-16 ENCOUNTER — Encounter: Payer: Self-pay | Admitting: Cardiology

## 2015-06-16 VITALS — BP 140/72 | HR 70 | Ht 70.5 in | Wt 204.6 lb

## 2015-06-16 DIAGNOSIS — I495 Sick sinus syndrome: Secondary | ICD-10-CM | POA: Diagnosis not present

## 2015-06-16 DIAGNOSIS — Z7901 Long term (current) use of anticoagulants: Secondary | ICD-10-CM

## 2015-06-16 DIAGNOSIS — E1129 Type 2 diabetes mellitus with other diabetic kidney complication: Secondary | ICD-10-CM | POA: Diagnosis not present

## 2015-06-16 DIAGNOSIS — Z45018 Encounter for adjustment and management of other part of cardiac pacemaker: Secondary | ICD-10-CM

## 2015-06-16 DIAGNOSIS — I480 Paroxysmal atrial fibrillation: Secondary | ICD-10-CM | POA: Diagnosis not present

## 2015-06-16 DIAGNOSIS — N186 End stage renal disease: Secondary | ICD-10-CM | POA: Diagnosis not present

## 2015-06-16 DIAGNOSIS — D631 Anemia in chronic kidney disease: Secondary | ICD-10-CM | POA: Diagnosis not present

## 2015-06-16 LAB — POCT INR: INR: 1.9

## 2015-06-16 NOTE — Progress Notes (Signed)
Electrophysiology Office Note   Date:  06/16/2015   ID:  Alexander [name], DOB 06/17/1940, MRN [id number]  PCP:  Horatio Pel, MD  Cardiologist:  Jacinto Reap Primary Electrophysiologist:  Sharrell Krawiec Meredith [name], MD    Chief Complaint  Patient presents with  . 90 day post implant     History of Present Illness: Alexander [name] is a 75 y.o. male who presents today for electrophysiology evaluation.   He has a history of moderate aortic stenosis, ESRD on HD, diabetes mellitus, hyperlipidemia, carotid artery disease with complete occlusion of the L carotid, prior TIA and atrial fibrillation.  He had a dual chamber pacemaker placed on 03/11/15. At that time, he was feeling weak and dizzy with presyncopal symptoms. Since then he has been feeling well without complaint.  Today, he denies symptoms of palpitations, chest pain, shortness of breath, orthopnea, PND, lower extremity edema, claudication bleeding, or neurologic sequela.   Past Medical History  Diagnosis Date  . End stage renal disease ([place])     hemodialysis 3 times a week  . Seasonal allergies   . Hyperlipidemia   . Anemia   . Depression   . Macular degeneration     both eyes  . GERD (gastroesophageal reflux disease)   . Hypertension   . CVA (cerebral infarction)     2004/affected left side  . Peptic ulcer     bleeding, 1969  . Kidney stones   . Diverticulosis   . Tubular adenoma of colon 07/2001  . Barrett's esophagus 05/2003  . Colon polyps   . Aortic stenosis 06/15/12    TEE - EF 0000000; grade 1 diastolic dysfunction; mild/mod aortic valve stenosis; Mitral valve had calcified annulus, mild pulm htn PA peak pressure 42mmHg  . S/P epidural steroid injection     last  injection over 10 years ago  . Diabetes mellitus without complication ([place])   . Bradycardia   . Paroxysmal atrial fibrillation [facility])    Past Surgical History  Procedure Laterality Date  . Av fistula placement  2009  . Laminectomy  1969  .  Tonsillectomy  1964  . 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 Cystoscopy  several times    kidney stones  . Back surgery    . Ep implantable device N/A 03/11/2015    Procedure: Pacemaker Implant;  Surgeon: Dalis Beers Meredith [name], MD;  Location: [facility];  Service: Cardiovascular;  Laterality: N/A;     Current Outpatient Prescriptions  Medication Sig Dispense Refill  . acetaminophen (TYLENOL) 500 MG tablet Take 1,000 mg by mouth every 6 (six) hours as needed for pain.    [name] b complex-vitamin c-folic acid (NEPHRO-VITE) 0.8 MG TABS Take 0.8 mg by mouth daily.     . bromocriptine (PARLODEL) 5 MG capsule Take 5 mg by mouth at bedtime.     . calcium acetate (PHOSLO) 667 MG capsule Take 667 mg by mouth 3 (three) times daily with meals. Take 2 tablets each morning, 1 at mid-day and 1 each evening    . cetirizine (ZYRTEC) 10 MG tablet Take 10 mg by mouth at bedtime.     . cinacalcet (SENSIPAR) 30 MG tablet Take 30 mg by mouth every other day.     . dorzolamide-timolol (COSOPT) 22.3-6.8 MG/ML ophthalmic solution Place 1 drop into the right eye 2 (two) times daily.    [name] FLUoxetine (PROZAC) 20 MG capsule Take 40 mg by mouth daily.     [name]  hydrALAZINE (APRESOLINE) 25 MG tablet Take 1 tablet (25 mg total) by mouth 2 (two) times daily. Take if blood pressure greater than 150/90 60 tablet 6  . midodrine (PROAMATINE) 10 MG tablet Take 1 tablet (10 mg total) by mouth 3 (three) times daily as needed (for systolic blood pressure less than 130). 90 tablet 5  . omeprazole (PRILOSEC) 20 MG capsule Take 1 capsule (20 mg total) by mouth 2 (two) times daily before a meal. 60 capsule 3  . oxyCODONE-acetaminophen (PERCOCET/ROXICET) 5-325 MG tablet Take 1 tablet by mouth every 6 (six) hours as needed for severe pain. 7 tablet 0  . warfarin (COUMADIN) 2.5 MG tablet TAKE 1 & 1/2 TO 2 TABLETS BY MOUTH DAILY AS DIRECTED BY COUMADIN CLINIC (Patient taking differently: Take 2.5 mg by mouth daily on Wed and Sat. Take  3.75 mg by mouth daily on Sun, Mon, Tue, Thur, and Fri in the evening.) 50 tablet 4   No current facility-administered medications for this visit.    Allergies:   Codeine; Penicillins; and Tramadol   Social History:  The patient  reports that he quit smoking about 17 years ago. He has never used smokeless tobacco. He reports that he does not drink alcohol or use illicit drugs.   Family History:  The patient's family history includes Hypertension in his father; Stomach cancer in his mother. There is no history of Colon cancer.    ROS:  Please see the history of present illness.   Otherwise, review of systems is positive for none.   All other systems are reviewed and negative.    PHYSICAL EXAM: VS:  BP 140/72 mmHg  Pulse 70  Ht 5' 10.5" (1.791 m)  Wt 204 lb 9.6 oz (92.806 kg)  BMI 28.93 kg/m2 , BMI Body mass index is 28.93 kg/(m^2). GEN: Well nourished, well developed, in no acute distress HEENT: normal Neck: no JVD, carotid bruits, or masses Cardiac: RRR; no murmurs, rubs, or gallops,no edema  Respiratory:  clear to auscultation bilaterally, normal work of breathing GI: soft, nontender, nondistended, + BS MS: no deformity or atrophy Skin: warm and dry, fistula left forearm, device pocket C/D/I Neuro:  Strength and sensation are intact Psych: euthymic mood, full affect  EKG:  EKG is ordered today. The ekg ordered today shows sinus rhythm, rate 70, inferior Q waves  Device interrogation performed today, results in [employer].   Recent Labs: 11/18/2014: Magnesium 1.9 12/18/2014: TSH 1.368 03/06/2015: Hemoglobin 12.0*; Platelets 180 03/12/2015: ALT 19; BUN 36*; Creatinine, Ser 7.01*; Potassium 4.0; Sodium 138    Lipid Panel     Component Value Date/Time   CHOL 104 04/04/2014 0843   TRIG 68 04/04/2014 0843   HDL 35* 04/04/2014 0843   CHOLHDL 3.0 04/04/2014 0843   VLDL 14 04/04/2014 0843   LDLCALC 55 04/04/2014 0843     Wt Readings from Last 3 Encounters:  06/16/15 204 lb  9.6 oz (92.806 kg)  03/12/15 197 lb 5 oz (89.5 kg)  01/28/15 204 lb (92.534 kg)      11/28/14 TTE: Study Conclusions  - Left ventricle: The cavity size was normal. Systolic function was normal. The estimated ejection fraction was in the range of 60% to 65%. Wall motion was normal; there were no regional wall motion abnormalities. Features are consistent with a pseudonormal left ventricular filling pattern, with concomitant abnormal relaxation and increased filling pressure (grade 2 diastolic dysfunction). Doppler parameters are consistent with high ventricular filling pressure. - Aortic valve: Severe diffuse thickening and  calcification. There was moderate stenosis. VTI ratio of LVOT to aortic valve: 0.31. Valve area (VTI): 1.27 cm^2. Indexed valve area (VTI): 0.59 cm^2/m^2. Mean velocity ratio of LVOT to aortic valve: 0.31. Valve area (Vmean): 1.29 cm^2. Indexed valve area (Vmean): 0.6 cm^2/m^2.  Mean gradient (S): 22 mm Hg. - Mitral valve: Severely calcified annulus. There was mild regurgitation. - Left atrium: The atrium was mildly dilated. - Right ventricle: The cavity size was mildly dilated. Wall thickness was normal. - Tricuspid valve: There was trivial regurgitation. - Pulmonic valve: There was trivial regurgitation. - Pulmonary arteries: PA peak pressure: 37 mm Hg (S).   ASSESSMENT AND PLAN:  1.  Atrial fibrillation: on warfarin.  Has CHADS2VASc of 4.  Currently taking warfarin. Dual-chamber pacemaker placed in December for symptomatically bradycardia and syncope.  Has had no AF since implant on device interrogation.   2. Presyncope: Dual-chamber pacemaker placed in December. Functioning appropriately. He did have some episodes of PMT, and the PVARP was increased  Current medicines are reviewed at length with the patient today.   The patient has concerns regarding his medicines.  The following changes were made today:  none  Labs/ tests ordered  today include:   Orders Placed This Encounter  Procedures  . EKG 12-Lead     Disposition:   FU with Tyrick Dunagan Meredith [name]  9 months  Signed, Tamura Lasky Meredith [name], MD  06/16/2015 3:17 PM     [place] [address] [place] [address] [phone] (office) [phone] (fax)

## 2015-06-16 NOTE — Patient Instructions (Addendum)
Medication Instructions:  Your physician recommends that you continue on your current medications as directed. Please refer to the Current Medication list given to you today.  Labwork: None ordered  Testing/Procedures: None ordered  Follow-Up: Remote monitoring is used to monitor your Pacemaker of ICD from home. This monitoring reduces the number of office visits required to check your device to one time per year. It allows [place] to keep an eye on the functioning of your device to ensure it is working properly. You are scheduled for a device check from home on 09/15/2015. You may send your transmission at any time that day. If you have a wireless device, the transmission will be sent automatically. After your physician reviews your transmission, you will receive a postcard with your next transmission date.  Your physician wants you to follow-up in: 9 months with Dr. Curt Bears. You will receive a reminder letter in the mail two months in advance. If you don't receive a letter, please call our office to schedule the follow-up appointment.  If you need a refill on your cardiac medications before your next appointment, please call your pharmacy.  Thank you for choosing [facility]!!   Trinidad Curet, RN [phone]

## 2015-06-17 ENCOUNTER — Encounter: Payer: Medicare Other | Admitting: Cardiology

## 2015-06-17 LAB — CUP PACEART INCLINIC DEVICE CHECK
Battery Voltage: 3.01 V
Brady Statistic RA Percent Paced: 72 %
Brady Statistic RV Percent Paced: 0.72 %
Date Time Interrogation Session: 20170320184923
Implantable Lead Implant Date: 20161213
Implantable Lead Implant Date: 20161213
Lead Channel Impedance Value: 450 Ohm
Lead Channel Impedance Value: 462.5 Ohm
Lead Channel Pacing Threshold Amplitude: 0.75 V
Lead Channel Pacing Threshold Pulse Width: 0.5 ms
Lead Channel Sensing Intrinsic Amplitude: 2.8 mV
Lead Channel Setting Pacing Amplitude: 1.75 V
MDC IDC LEAD LOCATION: 753859
MDC IDC LEAD LOCATION: 753860
MDC IDC MSMT BATTERY REMAINING LONGEVITY: 126
MDC IDC MSMT LEADCHNL RV PACING THRESHOLD AMPLITUDE: 0.875 V
MDC IDC MSMT LEADCHNL RV PACING THRESHOLD PULSEWIDTH: 0.5 ms
MDC IDC MSMT LEADCHNL RV SENSING INTR AMPL: 12 mV
MDC IDC SET LEADCHNL RV PACING AMPLITUDE: 1.125
MDC IDC SET LEADCHNL RV PACING PULSEWIDTH: 0.5 ms
MDC IDC SET LEADCHNL RV SENSING SENSITIVITY: 2 mV
Pulse Gen Serial Number: 7839215

## 2015-06-18 DIAGNOSIS — I482 Chronic atrial fibrillation: Secondary | ICD-10-CM | POA: Diagnosis not present

## 2015-06-18 DIAGNOSIS — N186 End stage renal disease: Secondary | ICD-10-CM | POA: Diagnosis not present

## 2015-06-18 DIAGNOSIS — E1129 Type 2 diabetes mellitus with other diabetic kidney complication: Secondary | ICD-10-CM | POA: Diagnosis not present

## 2015-06-18 DIAGNOSIS — D631 Anemia in chronic kidney disease: Secondary | ICD-10-CM | POA: Diagnosis not present

## 2015-06-20 DIAGNOSIS — E1129 Type 2 diabetes mellitus with other diabetic kidney complication: Secondary | ICD-10-CM | POA: Diagnosis not present

## 2015-06-20 DIAGNOSIS — N186 End stage renal disease: Secondary | ICD-10-CM | POA: Diagnosis not present

## 2015-06-20 DIAGNOSIS — D631 Anemia in chronic kidney disease: Secondary | ICD-10-CM | POA: Diagnosis not present

## 2015-06-23 DIAGNOSIS — N186 End stage renal disease: Secondary | ICD-10-CM | POA: Diagnosis not present

## 2015-06-23 DIAGNOSIS — E1129 Type 2 diabetes mellitus with other diabetic kidney complication: Secondary | ICD-10-CM | POA: Diagnosis not present

## 2015-06-23 DIAGNOSIS — D631 Anemia in chronic kidney disease: Secondary | ICD-10-CM | POA: Diagnosis not present

## 2015-06-25 ENCOUNTER — Other Ambulatory Visit: Payer: Self-pay | Admitting: *Deleted

## 2015-06-25 DIAGNOSIS — E1129 Type 2 diabetes mellitus with other diabetic kidney complication: Secondary | ICD-10-CM | POA: Diagnosis not present

## 2015-06-25 DIAGNOSIS — N186 End stage renal disease: Secondary | ICD-10-CM | POA: Diagnosis not present

## 2015-06-25 DIAGNOSIS — D631 Anemia in chronic kidney disease: Secondary | ICD-10-CM | POA: Diagnosis not present

## 2015-06-25 MED ORDER — WARFARIN SODIUM 2.5 MG PO TABS: ORAL_TABLET | ORAL | Status: DC

## 2015-06-27 DIAGNOSIS — N186 End stage renal disease: Secondary | ICD-10-CM | POA: Diagnosis not present

## 2015-06-27 DIAGNOSIS — D631 Anemia in chronic kidney disease: Secondary | ICD-10-CM | POA: Diagnosis not present

## 2015-06-27 DIAGNOSIS — E1129 Type 2 diabetes mellitus with other diabetic kidney complication: Secondary | ICD-10-CM | POA: Diagnosis not present

## 2015-06-27 DIAGNOSIS — Z992 Dependence on renal dialysis: Secondary | ICD-10-CM | POA: Diagnosis not present

## 2015-06-30 ENCOUNTER — Ambulatory Visit (INDEPENDENT_AMBULATORY_CARE_PROVIDER_SITE_OTHER): Payer: Medicare Other | Admitting: Pharmacist Clinician (PhC)/ Clinical Pharmacy Specialist

## 2015-06-30 DIAGNOSIS — N186 End stage renal disease: Secondary | ICD-10-CM | POA: Diagnosis not present

## 2015-06-30 DIAGNOSIS — Z7901 Long term (current) use of anticoagulants: Secondary | ICD-10-CM | POA: Diagnosis not present

## 2015-06-30 DIAGNOSIS — I480 Paroxysmal atrial fibrillation: Secondary | ICD-10-CM | POA: Diagnosis not present

## 2015-06-30 DIAGNOSIS — D631 Anemia in chronic kidney disease: Secondary | ICD-10-CM | POA: Diagnosis not present

## 2015-06-30 DIAGNOSIS — E1129 Type 2 diabetes mellitus with other diabetic kidney complication: Secondary | ICD-10-CM | POA: Diagnosis not present

## 2015-06-30 LAB — POCT INR: INR: 1.6

## 2015-07-02 DIAGNOSIS — E1129 Type 2 diabetes mellitus with other diabetic kidney complication: Secondary | ICD-10-CM | POA: Diagnosis not present

## 2015-07-02 DIAGNOSIS — D631 Anemia in chronic kidney disease: Secondary | ICD-10-CM | POA: Diagnosis not present

## 2015-07-02 DIAGNOSIS — N186 End stage renal disease: Secondary | ICD-10-CM | POA: Diagnosis not present

## 2015-07-04 DIAGNOSIS — N186 End stage renal disease: Secondary | ICD-10-CM | POA: Diagnosis not present

## 2015-07-04 DIAGNOSIS — E1129 Type 2 diabetes mellitus with other diabetic kidney complication: Secondary | ICD-10-CM | POA: Diagnosis not present

## 2015-07-04 DIAGNOSIS — D631 Anemia in chronic kidney disease: Secondary | ICD-10-CM | POA: Diagnosis not present

## 2015-07-07 DIAGNOSIS — E1129 Type 2 diabetes mellitus with other diabetic kidney complication: Secondary | ICD-10-CM | POA: Diagnosis not present

## 2015-07-07 DIAGNOSIS — N186 End stage renal disease: Secondary | ICD-10-CM | POA: Diagnosis not present

## 2015-07-07 DIAGNOSIS — D631 Anemia in chronic kidney disease: Secondary | ICD-10-CM | POA: Diagnosis not present

## 2015-07-09 DIAGNOSIS — D631 Anemia in chronic kidney disease: Secondary | ICD-10-CM | POA: Diagnosis not present

## 2015-07-09 DIAGNOSIS — E1129 Type 2 diabetes mellitus with other diabetic kidney complication: Secondary | ICD-10-CM | POA: Diagnosis not present

## 2015-07-09 DIAGNOSIS — N186 End stage renal disease: Secondary | ICD-10-CM | POA: Diagnosis not present

## 2015-07-11 DIAGNOSIS — D631 Anemia in chronic kidney disease: Secondary | ICD-10-CM | POA: Diagnosis not present

## 2015-07-11 DIAGNOSIS — E1129 Type 2 diabetes mellitus with other diabetic kidney complication: Secondary | ICD-10-CM | POA: Diagnosis not present

## 2015-07-11 DIAGNOSIS — N186 End stage renal disease: Secondary | ICD-10-CM | POA: Diagnosis not present

## 2015-07-14 ENCOUNTER — Ambulatory Visit (INDEPENDENT_AMBULATORY_CARE_PROVIDER_SITE_OTHER): Payer: Medicare Other | Admitting: Pharmacist Clinician (PhC)/ Clinical Pharmacy Specialist

## 2015-07-14 DIAGNOSIS — E1129 Type 2 diabetes mellitus with other diabetic kidney complication: Secondary | ICD-10-CM | POA: Diagnosis not present

## 2015-07-14 DIAGNOSIS — I480 Paroxysmal atrial fibrillation: Secondary | ICD-10-CM

## 2015-07-14 DIAGNOSIS — Z7901 Long term (current) use of anticoagulants: Secondary | ICD-10-CM | POA: Diagnosis not present

## 2015-07-14 DIAGNOSIS — N186 End stage renal disease: Secondary | ICD-10-CM | POA: Diagnosis not present

## 2015-07-14 DIAGNOSIS — D631 Anemia in chronic kidney disease: Secondary | ICD-10-CM | POA: Diagnosis not present

## 2015-07-14 LAB — POCT INR: INR: 1.5

## 2015-07-16 DIAGNOSIS — D631 Anemia in chronic kidney disease: Secondary | ICD-10-CM | POA: Diagnosis not present

## 2015-07-16 DIAGNOSIS — E1129 Type 2 diabetes mellitus with other diabetic kidney complication: Secondary | ICD-10-CM | POA: Diagnosis not present

## 2015-07-16 DIAGNOSIS — N186 End stage renal disease: Secondary | ICD-10-CM | POA: Diagnosis not present

## 2015-07-18 DIAGNOSIS — E1129 Type 2 diabetes mellitus with other diabetic kidney complication: Secondary | ICD-10-CM | POA: Diagnosis not present

## 2015-07-18 DIAGNOSIS — D631 Anemia in chronic kidney disease: Secondary | ICD-10-CM | POA: Diagnosis not present

## 2015-07-18 DIAGNOSIS — N186 End stage renal disease: Secondary | ICD-10-CM | POA: Diagnosis not present

## 2015-07-21 DIAGNOSIS — E1129 Type 2 diabetes mellitus with other diabetic kidney complication: Secondary | ICD-10-CM | POA: Diagnosis not present

## 2015-07-21 DIAGNOSIS — D631 Anemia in chronic kidney disease: Secondary | ICD-10-CM | POA: Diagnosis not present

## 2015-07-21 DIAGNOSIS — N186 End stage renal disease: Secondary | ICD-10-CM | POA: Diagnosis not present

## 2015-07-23 ENCOUNTER — Ambulatory Visit (INDEPENDENT_AMBULATORY_CARE_PROVIDER_SITE_OTHER): Payer: Medicare Other | Admitting: Pharmacist Clinician (PhC)/ Clinical Pharmacy Specialist

## 2015-07-23 DIAGNOSIS — Z7901 Long term (current) use of anticoagulants: Secondary | ICD-10-CM

## 2015-07-23 DIAGNOSIS — I482 Chronic atrial fibrillation: Secondary | ICD-10-CM | POA: Diagnosis not present

## 2015-07-23 DIAGNOSIS — I480 Paroxysmal atrial fibrillation: Secondary | ICD-10-CM | POA: Diagnosis not present

## 2015-07-23 DIAGNOSIS — D631 Anemia in chronic kidney disease: Secondary | ICD-10-CM | POA: Diagnosis not present

## 2015-07-23 DIAGNOSIS — N186 End stage renal disease: Secondary | ICD-10-CM | POA: Diagnosis not present

## 2015-07-23 DIAGNOSIS — E1129 Type 2 diabetes mellitus with other diabetic kidney complication: Secondary | ICD-10-CM | POA: Diagnosis not present

## 2015-07-23 LAB — POCT INR: INR: 1.9

## 2015-07-25 DIAGNOSIS — E1129 Type 2 diabetes mellitus with other diabetic kidney complication: Secondary | ICD-10-CM | POA: Diagnosis not present

## 2015-07-25 DIAGNOSIS — D631 Anemia in chronic kidney disease: Secondary | ICD-10-CM | POA: Diagnosis not present

## 2015-07-25 DIAGNOSIS — N186 End stage renal disease: Secondary | ICD-10-CM | POA: Diagnosis not present

## 2015-07-27 DIAGNOSIS — Z992 Dependence on renal dialysis: Secondary | ICD-10-CM | POA: Diagnosis not present

## 2015-07-27 DIAGNOSIS — E1129 Type 2 diabetes mellitus with other diabetic kidney complication: Secondary | ICD-10-CM | POA: Diagnosis not present

## 2015-07-27 DIAGNOSIS — N186 End stage renal disease: Secondary | ICD-10-CM | POA: Diagnosis not present

## 2015-07-28 DIAGNOSIS — E1129 Type 2 diabetes mellitus with other diabetic kidney complication: Secondary | ICD-10-CM | POA: Diagnosis not present

## 2015-07-28 DIAGNOSIS — N186 End stage renal disease: Secondary | ICD-10-CM | POA: Diagnosis not present

## 2015-07-30 DIAGNOSIS — N186 End stage renal disease: Secondary | ICD-10-CM | POA: Diagnosis not present

## 2015-07-30 DIAGNOSIS — E1129 Type 2 diabetes mellitus with other diabetic kidney complication: Secondary | ICD-10-CM | POA: Diagnosis not present

## 2015-08-01 DIAGNOSIS — E1129 Type 2 diabetes mellitus with other diabetic kidney complication: Secondary | ICD-10-CM | POA: Diagnosis not present

## 2015-08-01 DIAGNOSIS — N186 End stage renal disease: Secondary | ICD-10-CM | POA: Diagnosis not present

## 2015-08-04 DIAGNOSIS — N186 End stage renal disease: Secondary | ICD-10-CM | POA: Diagnosis not present

## 2015-08-04 DIAGNOSIS — E1129 Type 2 diabetes mellitus with other diabetic kidney complication: Secondary | ICD-10-CM | POA: Diagnosis not present

## 2015-08-05 DIAGNOSIS — H401133 Primary open-angle glaucoma, bilateral, severe stage: Secondary | ICD-10-CM | POA: Diagnosis not present

## 2015-08-06 ENCOUNTER — Encounter: Payer: Medicare Other | Admitting: Pharmacist Clinician (PhC)/ Clinical Pharmacy Specialist

## 2015-08-06 DIAGNOSIS — N186 End stage renal disease: Secondary | ICD-10-CM | POA: Diagnosis not present

## 2015-08-06 DIAGNOSIS — E1129 Type 2 diabetes mellitus with other diabetic kidney complication: Secondary | ICD-10-CM | POA: Diagnosis not present

## 2015-08-07 DIAGNOSIS — E221 Hyperprolactinemia: Secondary | ICD-10-CM | POA: Diagnosis not present

## 2015-08-07 DIAGNOSIS — E789 Disorder of lipoprotein metabolism, unspecified: Secondary | ICD-10-CM | POA: Diagnosis not present

## 2015-08-07 DIAGNOSIS — N186 End stage renal disease: Secondary | ICD-10-CM | POA: Diagnosis not present

## 2015-08-08 ENCOUNTER — Encounter: Payer: Self-pay | Admitting: Cardiology

## 2015-08-08 DIAGNOSIS — N186 End stage renal disease: Secondary | ICD-10-CM | POA: Diagnosis not present

## 2015-08-08 DIAGNOSIS — E1129 Type 2 diabetes mellitus with other diabetic kidney complication: Secondary | ICD-10-CM | POA: Diagnosis not present

## 2015-08-11 DIAGNOSIS — N186 End stage renal disease: Secondary | ICD-10-CM | POA: Diagnosis not present

## 2015-08-11 DIAGNOSIS — E1129 Type 2 diabetes mellitus with other diabetic kidney complication: Secondary | ICD-10-CM | POA: Diagnosis not present

## 2015-08-13 ENCOUNTER — Ambulatory Visit (INDEPENDENT_AMBULATORY_CARE_PROVIDER_SITE_OTHER): Payer: Medicare Other | Admitting: Pharmacist Clinician (PhC)/ Clinical Pharmacy Specialist

## 2015-08-13 DIAGNOSIS — N186 End stage renal disease: Secondary | ICD-10-CM | POA: Diagnosis not present

## 2015-08-13 DIAGNOSIS — Z7901 Long term (current) use of anticoagulants: Secondary | ICD-10-CM

## 2015-08-13 DIAGNOSIS — I480 Paroxysmal atrial fibrillation: Secondary | ICD-10-CM

## 2015-08-13 DIAGNOSIS — E1129 Type 2 diabetes mellitus with other diabetic kidney complication: Secondary | ICD-10-CM | POA: Diagnosis not present

## 2015-08-13 LAB — POCT INR: INR: 1.9

## 2015-08-14 DIAGNOSIS — E221 Hyperprolactinemia: Secondary | ICD-10-CM | POA: Diagnosis not present

## 2015-08-14 DIAGNOSIS — E789 Disorder of lipoprotein metabolism, unspecified: Secondary | ICD-10-CM | POA: Diagnosis not present

## 2015-08-15 DIAGNOSIS — N186 End stage renal disease: Secondary | ICD-10-CM | POA: Diagnosis not present

## 2015-08-15 DIAGNOSIS — E1129 Type 2 diabetes mellitus with other diabetic kidney complication: Secondary | ICD-10-CM | POA: Diagnosis not present

## 2015-08-18 DIAGNOSIS — E1129 Type 2 diabetes mellitus with other diabetic kidney complication: Secondary | ICD-10-CM | POA: Diagnosis not present

## 2015-08-18 DIAGNOSIS — N186 End stage renal disease: Secondary | ICD-10-CM | POA: Diagnosis not present

## 2015-08-20 DIAGNOSIS — I482 Chronic atrial fibrillation: Secondary | ICD-10-CM | POA: Diagnosis not present

## 2015-08-20 DIAGNOSIS — N186 End stage renal disease: Secondary | ICD-10-CM | POA: Diagnosis not present

## 2015-08-20 DIAGNOSIS — E1129 Type 2 diabetes mellitus with other diabetic kidney complication: Secondary | ICD-10-CM | POA: Diagnosis not present

## 2015-08-22 DIAGNOSIS — E1129 Type 2 diabetes mellitus with other diabetic kidney complication: Secondary | ICD-10-CM | POA: Diagnosis not present

## 2015-08-22 DIAGNOSIS — N186 End stage renal disease: Secondary | ICD-10-CM | POA: Diagnosis not present

## 2015-08-25 ENCOUNTER — Encounter: Payer: Self-pay | Admitting: Cardiology

## 2015-08-25 DIAGNOSIS — N186 End stage renal disease: Secondary | ICD-10-CM | POA: Diagnosis not present

## 2015-08-25 DIAGNOSIS — E1129 Type 2 diabetes mellitus with other diabetic kidney complication: Secondary | ICD-10-CM | POA: Diagnosis not present

## 2015-08-27 ENCOUNTER — Ambulatory Visit (INDEPENDENT_AMBULATORY_CARE_PROVIDER_SITE_OTHER): Payer: Medicare Other | Admitting: Pharmacist Clinician (PhC)/ Clinical Pharmacy Specialist

## 2015-08-27 DIAGNOSIS — Z7901 Long term (current) use of anticoagulants: Secondary | ICD-10-CM | POA: Diagnosis not present

## 2015-08-27 DIAGNOSIS — I480 Paroxysmal atrial fibrillation: Secondary | ICD-10-CM | POA: Diagnosis not present

## 2015-08-27 DIAGNOSIS — Z992 Dependence on renal dialysis: Secondary | ICD-10-CM | POA: Diagnosis not present

## 2015-08-27 DIAGNOSIS — N186 End stage renal disease: Secondary | ICD-10-CM | POA: Diagnosis not present

## 2015-08-27 DIAGNOSIS — E1129 Type 2 diabetes mellitus with other diabetic kidney complication: Secondary | ICD-10-CM | POA: Diagnosis not present

## 2015-08-27 LAB — POCT INR: INR: 1.6

## 2015-08-29 DIAGNOSIS — E1129 Type 2 diabetes mellitus with other diabetic kidney complication: Secondary | ICD-10-CM | POA: Diagnosis not present

## 2015-08-29 DIAGNOSIS — D631 Anemia in chronic kidney disease: Secondary | ICD-10-CM | POA: Diagnosis not present

## 2015-08-29 DIAGNOSIS — N186 End stage renal disease: Secondary | ICD-10-CM | POA: Diagnosis not present

## 2015-09-01 DIAGNOSIS — N186 End stage renal disease: Secondary | ICD-10-CM | POA: Diagnosis not present

## 2015-09-01 DIAGNOSIS — E1129 Type 2 diabetes mellitus with other diabetic kidney complication: Secondary | ICD-10-CM | POA: Diagnosis not present

## 2015-09-01 DIAGNOSIS — D631 Anemia in chronic kidney disease: Secondary | ICD-10-CM | POA: Diagnosis not present

## 2015-09-03 DIAGNOSIS — E1129 Type 2 diabetes mellitus with other diabetic kidney complication: Secondary | ICD-10-CM | POA: Diagnosis not present

## 2015-09-03 DIAGNOSIS — N186 End stage renal disease: Secondary | ICD-10-CM | POA: Diagnosis not present

## 2015-09-03 DIAGNOSIS — D631 Anemia in chronic kidney disease: Secondary | ICD-10-CM | POA: Diagnosis not present

## 2015-09-04 DIAGNOSIS — M79641 Pain in right hand: Secondary | ICD-10-CM | POA: Diagnosis not present

## 2015-09-04 DIAGNOSIS — M79642 Pain in left hand: Secondary | ICD-10-CM | POA: Diagnosis not present

## 2015-09-04 DIAGNOSIS — M19041 Primary osteoarthritis, right hand: Secondary | ICD-10-CM | POA: Diagnosis not present

## 2015-09-04 DIAGNOSIS — M19042 Primary osteoarthritis, left hand: Secondary | ICD-10-CM | POA: Diagnosis not present

## 2015-09-05 DIAGNOSIS — D631 Anemia in chronic kidney disease: Secondary | ICD-10-CM | POA: Diagnosis not present

## 2015-09-05 DIAGNOSIS — N186 End stage renal disease: Secondary | ICD-10-CM | POA: Diagnosis not present

## 2015-09-05 DIAGNOSIS — E1129 Type 2 diabetes mellitus with other diabetic kidney complication: Secondary | ICD-10-CM | POA: Diagnosis not present

## 2015-09-08 DIAGNOSIS — D631 Anemia in chronic kidney disease: Secondary | ICD-10-CM | POA: Diagnosis not present

## 2015-09-08 DIAGNOSIS — N186 End stage renal disease: Secondary | ICD-10-CM | POA: Diagnosis not present

## 2015-09-08 DIAGNOSIS — E1129 Type 2 diabetes mellitus with other diabetic kidney complication: Secondary | ICD-10-CM | POA: Diagnosis not present

## 2015-09-09 ENCOUNTER — Ambulatory Visit (INDEPENDENT_AMBULATORY_CARE_PROVIDER_SITE_OTHER): Payer: Medicare Other | Admitting: Pharmacist Clinician (PhC)/ Clinical Pharmacy Specialist

## 2015-09-09 ENCOUNTER — Encounter: Payer: Self-pay | Admitting: Cardiovascular Disease

## 2015-09-09 ENCOUNTER — Ambulatory Visit (INDEPENDENT_AMBULATORY_CARE_PROVIDER_SITE_OTHER): Payer: Medicare Other | Admitting: Cardiovascular Disease

## 2015-09-09 VITALS — BP 90/56 | HR 108 | Ht 70.5 in | Wt 207.6 lb

## 2015-09-09 DIAGNOSIS — I350 Nonrheumatic aortic (valve) stenosis: Secondary | ICD-10-CM | POA: Diagnosis not present

## 2015-09-09 DIAGNOSIS — I4891 Unspecified atrial fibrillation: Secondary | ICD-10-CM | POA: Diagnosis not present

## 2015-09-09 DIAGNOSIS — I951 Orthostatic hypotension: Secondary | ICD-10-CM | POA: Insufficient documentation

## 2015-09-09 DIAGNOSIS — I480 Paroxysmal atrial fibrillation: Secondary | ICD-10-CM

## 2015-09-09 DIAGNOSIS — Z7901 Long term (current) use of anticoagulants: Secondary | ICD-10-CM | POA: Diagnosis not present

## 2015-09-09 HISTORY — DX: Orthostatic hypotension: I95.1

## 2015-09-09 LAB — POCT INR: INR: 1.9

## 2015-09-09 MED ORDER — WARFARIN SODIUM 5 MG PO TABS: ORAL_TABLET | ORAL | Status: DC

## 2015-09-09 MED ORDER — MIDODRINE HCL 5 MG PO TABS
15.0000 mg | ORAL_TABLET | Freq: Three times a day (TID) | ORAL | Status: DC | PRN
Start: 2015-09-09 — End: 2016-08-09

## 2015-09-09 NOTE — Progress Notes (Signed)
Cardiology Office Note   Date:  09/09/2015   ID:  Alexander [name], DOB 10/26/1940, MRN [id number]  PCP:  Horatio Pel, MD  Cardiologist:   Skeet Latch, MD  Electrophysiologist: Allegra Lai, MD  Chief Complaint  Patient presents with  . Follow-up    overdue 3 month  had Stress Test and med changes at last ov in October  pt c/o BP dropping on dialysis accompanied by Dizziness; also becomes dizzy when not on dialysis when he stands up  . Orthostatic vitals      History of Present Illness: Alexander [name] is a 75 y.o. malewith moderate aortic stenosis, ESRD on HD, diabetes mellitus, hyperlipidemia, carotid artery disease with complete occlusion of the L carotid, prior TIA and paroxysmal atrial fibrillation  who presents for follow up on hypotension.  He was initially evaluated on 12/04/14 with hypotension requiring midodrine and syncope.  He has worn a loop recorder that did not reveal any arrhythmias.  He had an echo that revealed LVEF 60-65% with grade 2 diastolic dysfunction, moderate AS and mild MR. He wore a 48 hour Holter that revealed episodes of dizziness associated with heart rates in the 50s. He had a PPM implanted 01/2015.  He had a repeat monitor 12/2014 that showed atrial fibrillation with occasional junctional escape.  He subsequently had a Lexiscan Cardiolite 01/2015 that was negative for ischemia but showed an area of distal inferiror infarct vs artifact.     Alexander [name] has been feeling very tired lately and notes that his blood pressure has been low.  At the last appointment he was taking midodrine on average once per day.  Lately he Has been taking it 3 times daily and still has low blood pressures. He did not take any today because he wanted [place] to see the full effect of his hypotension.  On average when taking the midodrine three times daily his SBP is in the 110s.  In dialysis it sometimes goes as low as the 80s.  He feels very tired when it is this low.  He  also reports exertional fatigue. He has not noted any palpitations but does endorse several near syncopal episodes. He denies lower extremity edema, orthopnea or PND. Alexander [name] also notes that he has not been eating or drinking much lately.  He denies nausea or emesis.  Past Medical History  Diagnosis Date  . End stage renal disease ([place])     hemodialysis 3 times a week  . Seasonal allergies   . Hyperlipidemia   . Anemia   . Depression   . Macular degeneration     both eyes  . GERD (gastroesophageal reflux disease)   . Hypertension   . CVA (cerebral infarction)     2004/affected left side  . Peptic ulcer     bleeding, 1969  . Kidney stones   . Diverticulosis   . Tubular adenoma of colon 07/2001  . Barrett's esophagus 05/2003  . Colon polyps   . Aortic stenosis 06/15/12    TEE - EF 0000000; grade 1 diastolic dysfunction; mild/mod aortic valve stenosis; Mitral valve had calcified annulus, mild pulm htn PA peak pressure 24mmHg  . S/P epidural steroid injection     last  injection over 10 years ago  . Diabetes mellitus without complication ([place])   . Bradycardia   . Paroxysmal atrial fibrillation ([facility])   . Orthostatic hypotension 09/09/2015    Past Surgical History  Procedure Laterality Date  . Av fistula placement  2009  . Laminectomy  1969  . Tonsillectomy  1964  . 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 Cystoscopy  several times    kidney stones  . Back surgery    . Ep implantable device N/A 03/11/2015    Procedure: Pacemaker Implant;  Surgeon: Will Meredith [name], MD;  Location: [facility];  Service: Cardiovascular;  Laterality: N/A;     Current Outpatient Prescriptions  Medication Sig Dispense Refill  . acetaminophen (TYLENOL) 500 MG tablet Take 1,000 mg by mouth every 6 (six) hours as needed for pain.    [name] b complex-vitamin c-folic acid (NEPHRO-VITE) 0.8 MG TABS Take 0.8 mg by mouth daily.     . bromocriptine (PARLODEL) 5 MG capsule Take 5 mg by mouth at  bedtime.     . calcium acetate (PHOSLO) 667 MG capsule Take 667 mg by mouth 4 (four) times daily.     . cetirizine (ZYRTEC) 10 MG tablet Take 10 mg by mouth at bedtime.     . cinacalcet (SENSIPAR) 30 MG tablet Take 30 mg by mouth daily.     . dorzolamide-timolol (COSOPT) 22.3-6.8 MG/ML ophthalmic solution Place 1 drop into the right eye 2 (two) times daily.    [name] FLUoxetine (PROZAC) 20 MG capsule Take 40 mg by mouth daily.     . midodrine (PROAMATINE) 5 MG tablet Take 3 tablets (15 mg total) by mouth 3 (three) times daily as needed (for systolic blood pressure less than 130). 270 tablet 3  . omeprazole (PRILOSEC) 20 MG capsule Take 20 mg by mouth daily.    [name] warfarin (COUMADIN) 5 MG tablet TAKE BY MOUTH DAILY AS DIRECTED BY COUMADIN CLINIC 90 tablet 3   No current facility-administered medications for this visit.    Allergies:   Codeine; Penicillins; and Tramadol    Social History:  The patient  reports that he quit smoking about 17 years ago. He has never used smokeless tobacco. He reports that he does not drink alcohol or use illicit drugs.   Family History:  The patient's family history includes Hypertension in his father; Stomach cancer in his mother. There is no history of Colon cancer.    ROS:  Please see the history of present illness.   Otherwise, review of systems are positive for none.   All other systems are reviewed and negative.    PHYSICAL EXAM: VS:  BP 90/56 mmHg  Pulse 108  Ht 5' 10.5" (1.791 m)  Wt 207 lb 9.6 oz (94.167 kg)  BMI 29.36 kg/m2 , BMI Body mass index is 29.36 kg/(m^2).  Orthostatics: Laying: 150/72, 54; sitting 141/72, 54; standing 114/633; 73 GENERAL:  Well appearing HEENT:  Pupils equal round and reactive, fundi not visualized, oral mucosa unremarkable NECK:  No jugular venous distention, waveform within normal limits, carotid upstroke brisk and symmetric, no bruits LYMPHATICS:  No cervical adenopathy LUNGS:  Clear to auscultation bilaterally HEART:   RRR.  PMI not displaced or sustained,S1 and S2 within normal limits, no S3, no S4, no clicks, no rubs, III/VI mid-peaking crescendo-decrescendo murmur at the LUSB. ABD:  Flat, positive bowel sounds normal in frequency in pitch, no bruits, no rebound, no guarding, no midline pulsatile mass, no hepatomegaly, no splenomegaly EXT:  2 plus pulses throughout, no edema, no cyanosis no clubbing SKIN:  No rashes no nodules NEURO:  Cranial nerves II through XII grossly intact, motor grossly intact throughout PSYCH:  Cognitively intact, oriented to person place and time  EKG:  EKG is not ordered today. 09/09/15: Atrial fibrillation rate 108 bpm.  Prior inferior infarct. 01/16/15: Sinus bradycardia.  First degree heart block.  LAFB.  U waves. The ekg from 11/19/14 demonstrates sinus bradycardia at 49 bpm. First degree heart block.   Lexiscan Cardiolite 01/28/15:  Nuclear stress EF: 67%.  The left ventricular ejection fraction is hyperdynamic (>65%).  There was no ST segment deviation noted during stress.  This is a low risk study.  Low risk stress nuclear study with a small, severe, predominantly fixed distal inferior defect consistent with small prior infarct vs thinning; no significant ischemia; EF 67 with normal wall motion.  11/28/14 TTE:  Study Conclusions  - Left ventricle: The cavity size was normal. Systolic function was normal. The estimated ejection fraction was in the range of 60% to 65%. Wall motion was normal; there were no regional wall motion abnormalities. Features are consistent with a pseudonormal left ventricular filling pattern, with concomitant abnormal relaxation and increased filling pressure (grade 2 diastolic dysfunction). Doppler parameters are consistent with high ventricular filling pressure. - Aortic valve: Severe diffuse thickening and calcification. There was moderate stenosis.  VTI ratio of LVOT to aortic valve: 0.31. Valve area (VTI): 1.27  cm^2. Indexed valve area (VTI): 0.59 cm^2/m^2. Mean velocity ratio of LVOT to aortic valve: 0.31. Valve area (Vmean): 1.29 cm^2. Indexed valve area (Vmean): 0.6 cm^2/m^2.  Mean gradient (S): 22 mm Hg. - Mitral valve: Severely calcified annulus. There was mild regurgitation. - Left atrium: The atrium was mildly dilated. - Right ventricle: The cavity size was mildly dilated. Wall thickness was normal. - Tricuspid valve: There was trivial regurgitation. - Pulmonic valve: There was trivial regurgitation. - Pulmonary arteries: PA peak pressure: 37 mm Hg (S).  Recent Labs: 11/18/2014: Magnesium 1.9 12/18/2014: TSH 1.368 03/06/2015: Hemoglobin 12.0*; Platelets 180 03/12/2015: ALT 19; BUN 36*; Creatinine, Ser 7.01*; Potassium 4.0; Sodium 138    Lipid Panel    Component Value Date/Time   CHOL 104 04/04/2014 0843   TRIG 68 04/04/2014 0843   HDL 35* 04/04/2014 0843   CHOLHDL 3.0 04/04/2014 0843   VLDL 14 04/04/2014 0843   LDLCALC 55 04/04/2014 0843      Wt Readings from Last 3 Encounters:  09/09/15 207 lb 9.6 oz (94.167 kg)  06/16/15 204 lb 9.6 oz (92.806 kg)  03/12/15 197 lb 5 oz (89.5 kg)      Other studies Reviewed: Additional studies/ records that were reviewed today include: [facility] records Review of the above records demonstrates:  Please see elsewhere in the note.     ASSESSMENT AND PLAN:  # Hypotension/syncope: Alexander [name] is having more episodes of hypotension and pre-syncope.  We will increase midodrine to 15 mg tid.  He is in atrial fibrillation today, which may also be contribHis INR has not been within 2-3 recently. He was seen in our Coumadin clinic today and his dose was adjusted. Once his INR is within range for at least 3 weeks we will start amiodarone if he continues to be in atrial fibrillation. He is not interested in having a transesophageal echo to expedite this process. I also discussed the importance of eating regular meals and increasing his fluid  intake.  # Bradycardia/atrial fibrillation: Amiodarone  was previously discontinued due to bradycardia. He now has a pacemaker in place. Amiodarone will be restarted as above.  We cannot use any rate control given his hypotension.  Continue warfarin for anticoagulation.  # Moderate aortic stenosis: Alexander [name] aortic stenosis is  in the moderate range.  It is unlikely that this is causing his symptoms, though it certainly doesn't help. For now we will continue to monitor it anually.  Current medicines are reviewed at length with the patient today.  The patient does not have concerns regarding medicines.  The following changes have been made: Increase midodrine to 15 mg tid.  Labs/ tests ordered today include:   Orders Placed This Encounter  Procedures  . EKG 12-Lead     Disposition:   FU with Dr. Jonelle Sidle C. Orason in 1 month.   Signed, Skeet Latch, MD  09/09/2015 1:57 PM    [place]

## 2015-09-09 NOTE — Patient Instructions (Signed)
Medication Instructions:   INCREASE MIDODRINE TO 15 MG THREE TIMES DAILY AS NEEDED= 3 OF THE 5 MG TABLETS THREE TIMES DAILY  Follow-Up:  Your physician recommends that you schedule a follow-up appointment in: [address] [facility]

## 2015-09-10 DIAGNOSIS — G629 Polyneuropathy, unspecified: Secondary | ICD-10-CM | POA: Diagnosis not present

## 2015-09-10 DIAGNOSIS — E1129 Type 2 diabetes mellitus with other diabetic kidney complication: Secondary | ICD-10-CM | POA: Diagnosis not present

## 2015-09-10 DIAGNOSIS — D631 Anemia in chronic kidney disease: Secondary | ICD-10-CM | POA: Diagnosis not present

## 2015-09-10 DIAGNOSIS — N186 End stage renal disease: Secondary | ICD-10-CM | POA: Diagnosis not present

## 2015-09-10 DIAGNOSIS — M6530 Trigger finger, unspecified finger: Secondary | ICD-10-CM | POA: Diagnosis not present

## 2015-09-12 DIAGNOSIS — N186 End stage renal disease: Secondary | ICD-10-CM | POA: Diagnosis not present

## 2015-09-12 DIAGNOSIS — D631 Anemia in chronic kidney disease: Secondary | ICD-10-CM | POA: Diagnosis not present

## 2015-09-12 DIAGNOSIS — E1129 Type 2 diabetes mellitus with other diabetic kidney complication: Secondary | ICD-10-CM | POA: Diagnosis not present

## 2015-09-12 DIAGNOSIS — I482 Chronic atrial fibrillation: Secondary | ICD-10-CM | POA: Diagnosis not present

## 2015-09-13 LAB — POCT INR: INR: 1.76

## 2015-09-15 DIAGNOSIS — E1129 Type 2 diabetes mellitus with other diabetic kidney complication: Secondary | ICD-10-CM | POA: Diagnosis not present

## 2015-09-15 DIAGNOSIS — N186 End stage renal disease: Secondary | ICD-10-CM | POA: Diagnosis not present

## 2015-09-15 DIAGNOSIS — D631 Anemia in chronic kidney disease: Secondary | ICD-10-CM | POA: Diagnosis not present

## 2015-09-16 ENCOUNTER — Ambulatory Visit (INDEPENDENT_AMBULATORY_CARE_PROVIDER_SITE_OTHER): Payer: Medicare Other | Admitting: *Deleted

## 2015-09-16 ENCOUNTER — Ambulatory Visit (INDEPENDENT_AMBULATORY_CARE_PROVIDER_SITE_OTHER): Payer: Medicare Other | Admitting: Pharmacist

## 2015-09-16 ENCOUNTER — Telehealth: Payer: Self-pay | Admitting: Cardiology

## 2015-09-16 DIAGNOSIS — I495 Sick sinus syndrome: Secondary | ICD-10-CM | POA: Diagnosis not present

## 2015-09-16 DIAGNOSIS — I480 Paroxysmal atrial fibrillation: Secondary | ICD-10-CM

## 2015-09-16 DIAGNOSIS — Z7901 Long term (current) use of anticoagulants: Secondary | ICD-10-CM

## 2015-09-16 NOTE — Telephone Encounter (Signed)
LMOVM reminding pt to send remote transmission.   

## 2015-09-17 DIAGNOSIS — E1129 Type 2 diabetes mellitus with other diabetic kidney complication: Secondary | ICD-10-CM | POA: Diagnosis not present

## 2015-09-17 DIAGNOSIS — D631 Anemia in chronic kidney disease: Secondary | ICD-10-CM | POA: Diagnosis not present

## 2015-09-17 DIAGNOSIS — N186 End stage renal disease: Secondary | ICD-10-CM | POA: Diagnosis not present

## 2015-09-17 LAB — CUP PACEART REMOTE DEVICE CHECK
Date Time Interrogation Session: 20170621145647
Implantable Lead Implant Date: 20161213
Implantable Lead Location: 753859
Implantable Lead Location: 753860
Lead Channel Setting Pacing Amplitude: 1.125
Lead Channel Setting Pacing Amplitude: 1.75 V
Lead Channel Setting Pacing Pulse Width: 0.5 ms
Lead Channel Setting Sensing Sensitivity: 2 mV
MDC IDC LEAD IMPLANT DT: 20161213
MDC IDC MSMT LEADCHNL RA IMPEDANCE VALUE: 410 Ohm
MDC IDC MSMT LEADCHNL RV IMPEDANCE VALUE: 450 Ohm
MDC IDC MSMT LEADCHNL RV SENSING INTR AMPL: 10.1 mV
MDC IDC STAT BRADY RA PERCENT PACED: 34 %
MDC IDC STAT BRADY RV PERCENT PACED: 4.1 %
Pulse Gen Model: 2240
Pulse Gen Serial Number: 7839215

## 2015-09-17 NOTE — Progress Notes (Signed)
Remote pacemaker transmission.   

## 2015-09-19 ENCOUNTER — Encounter: Payer: Self-pay | Admitting: Cardiology

## 2015-09-19 DIAGNOSIS — D631 Anemia in chronic kidney disease: Secondary | ICD-10-CM | POA: Diagnosis not present

## 2015-09-19 DIAGNOSIS — N186 End stage renal disease: Secondary | ICD-10-CM | POA: Diagnosis not present

## 2015-09-19 DIAGNOSIS — E1129 Type 2 diabetes mellitus with other diabetic kidney complication: Secondary | ICD-10-CM | POA: Diagnosis not present

## 2015-09-22 DIAGNOSIS — N186 End stage renal disease: Secondary | ICD-10-CM | POA: Diagnosis not present

## 2015-09-24 DIAGNOSIS — N186 End stage renal disease: Secondary | ICD-10-CM | POA: Diagnosis not present

## 2015-09-26 DIAGNOSIS — N186 End stage renal disease: Secondary | ICD-10-CM | POA: Diagnosis not present

## 2015-09-26 DIAGNOSIS — E1129 Type 2 diabetes mellitus with other diabetic kidney complication: Secondary | ICD-10-CM | POA: Diagnosis not present

## 2015-09-26 DIAGNOSIS — Z992 Dependence on renal dialysis: Secondary | ICD-10-CM | POA: Diagnosis not present

## 2015-09-27 ENCOUNTER — Encounter: Payer: Self-pay | Admitting: Cardiology

## 2015-09-29 DIAGNOSIS — N186 End stage renal disease: Secondary | ICD-10-CM | POA: Diagnosis not present

## 2015-09-29 DIAGNOSIS — D631 Anemia in chronic kidney disease: Secondary | ICD-10-CM | POA: Diagnosis not present

## 2015-09-29 DIAGNOSIS — E1129 Type 2 diabetes mellitus with other diabetic kidney complication: Secondary | ICD-10-CM | POA: Diagnosis not present

## 2015-09-30 ENCOUNTER — Encounter: Payer: Self-pay | Admitting: Cardiology

## 2015-10-01 ENCOUNTER — Ambulatory Visit (INDEPENDENT_AMBULATORY_CARE_PROVIDER_SITE_OTHER): Payer: Medicare Other | Admitting: Pharmacist Clinician (PhC)/ Clinical Pharmacy Specialist

## 2015-10-01 DIAGNOSIS — I480 Paroxysmal atrial fibrillation: Secondary | ICD-10-CM

## 2015-10-01 DIAGNOSIS — D631 Anemia in chronic kidney disease: Secondary | ICD-10-CM | POA: Diagnosis not present

## 2015-10-01 DIAGNOSIS — E1129 Type 2 diabetes mellitus with other diabetic kidney complication: Secondary | ICD-10-CM | POA: Diagnosis not present

## 2015-10-01 DIAGNOSIS — N186 End stage renal disease: Secondary | ICD-10-CM | POA: Diagnosis not present

## 2015-10-01 DIAGNOSIS — Z7901 Long term (current) use of anticoagulants: Secondary | ICD-10-CM

## 2015-10-01 LAB — POCT INR: INR: 1.7

## 2015-10-02 ENCOUNTER — Telehealth: Payer: Self-pay | Admitting: *Deleted

## 2015-10-02 ENCOUNTER — Encounter: Payer: Self-pay | Admitting: Neurology

## 2015-10-02 ENCOUNTER — Ambulatory Visit (INDEPENDENT_AMBULATORY_CARE_PROVIDER_SITE_OTHER): Payer: Medicare Other | Admitting: Neurology

## 2015-10-02 VITALS — BP 135/81 | HR 122 | Ht 70.5 in | Wt 209.5 lb

## 2015-10-02 DIAGNOSIS — E1142 Type 2 diabetes mellitus with diabetic polyneuropathy: Secondary | ICD-10-CM

## 2015-10-02 DIAGNOSIS — M79643 Pain in unspecified hand: Secondary | ICD-10-CM | POA: Diagnosis not present

## 2015-10-02 HISTORY — DX: Type 2 diabetes mellitus with diabetic polyneuropathy: E11.42

## 2015-10-02 NOTE — Telephone Encounter (Signed)
Spoke to patient in regards to persistent AF. Patient states that he does feel his heart racing in the mornings. He also states that he has been more ShOB and fatigued over the last 3-4 days.   Patient will follow up with Dr.Camnitz on 7/11 @ 0945. Patient voiced understanding.

## 2015-10-02 NOTE — Progress Notes (Signed)
Reason for visit: Hand pain  Referring physician: Dr. Veleta Miners is a 75 y.o. male  History of present illness:  Alexander [name] is a 75 year old white male with a history of diabetes, end-stage renal disease on hemodialysis, and a two-year history of bilateral hand pain, left greater than right. The patient has numbness and achy discomfort in both hands that is worse in the evening hours. The patient denies any numbness or discomfort in the feet. He does have some mild gait instability, he has not had any falls. He reports a history of neck and low back pain, he has had prior lumbosacral spine surgery. The neck pain is in the base of the neck without radiation into the arms. He feels weak all over with fatigue, he has no particular weakness of one arm or one leg. He denies that he drops things from the hands, but he is having some problems with sensation and feeling things with the hands. He has macular degeneration with near blindness. He is sent to this office for further evaluation.  Past Medical History  Diagnosis Date  . End stage renal disease ([place])     hemodialysis 3 times a week  . Seasonal allergies   . Hyperlipidemia   . Anemia   . Depression   . Macular degeneration     both eyes  . GERD (gastroesophageal reflux disease)   . Hypertension   . CVA (cerebral infarction)     2004/affected left side  . Peptic ulcer     bleeding, 1969  . Kidney stones   . Diverticulosis   . Tubular adenoma of colon 07/2001  . Barrett's esophagus 05/2003  . Colon polyps   . Aortic stenosis 06/15/12    TEE - EF 0000000; grade 1 diastolic dysfunction; mild/mod aortic valve stenosis; Mitral valve had calcified annulus, mild pulm htn PA peak pressure 107mmHg  . S/P epidural steroid injection     last  injection over 10 years ago  . Diabetes mellitus without complication ([place])   . Bradycardia   . Paroxysmal atrial fibrillation ([facility])   . Orthostatic hypotension 09/09/2015  . Diabetic  peripheral neuropathy ([place]) 10/02/2015    Past Surgical History  Procedure Laterality Date  . Av fistula placement  2009  . Laminectomy  1969  . Tonsillectomy  1964  . 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 Cystoscopy  several times    kidney stones  . Back surgery    . Ep implantable device N/A 03/11/2015    Procedure: Pacemaker Implant;  Surgeon: Will Meredith [name], MD;  Location: [facility];  Service: Cardiovascular;  Laterality: N/A;    Family History  Problem Relation Age of Onset  . Colon cancer Neg Hx   . Stomach cancer Mother   . Hypertension Father     Died of heart attack  . Stroke Sister   . Heart disease Sister   . Cancer Brother     Social history:  reports that he quit smoking about 17 years ago. He has never used smokeless tobacco. He reports that he does not drink alcohol or use illicit drugs.  Medications:  Prior to Admission medications   Medication Sig Start Date End Date Taking? Authorizing Provider  acetaminophen (TYLENOL) 500 MG tablet Take 1,000 mg by mouth every 6 (six) hours as needed for pain.    Historical Provider, MD  b complex-vitamin c-folic acid (NEPHRO-VITE) 0.8 MG TABS Take 0.8  mg by mouth daily.     Historical Provider, MD  bromocriptine (PARLODEL) 5 MG capsule Take 5 mg by mouth at bedtime.  11/30/10   Historical Provider, MD  calcium acetate (PHOSLO) 667 MG capsule Take 667 mg by mouth 4 (four) times daily.     Historical Provider, MD  cetirizine (ZYRTEC) 10 MG tablet Take 10 mg by mouth at bedtime.     Historical Provider, MD  cinacalcet (SENSIPAR) 30 MG tablet Take 30 mg by mouth daily.     Historical Provider, MD  dorzolamide-timolol (COSOPT) 22.3-6.8 MG/ML ophthalmic solution Place 1 drop into the right eye 2 (two) times daily. 08/13/13   Historical Provider, MD  FLUoxetine (PROZAC) 20 MG capsule Take 40 mg by mouth daily.  11/30/10   Historical Provider, MD  midodrine (PROAMATINE) 5 MG tablet Take 3 tablets (15 mg total) by mouth  3 (three) times daily as needed (for systolic blood pressure less than 130). 09/09/15   Skeet Latch, MD  omeprazole (PRILOSEC) 20 MG capsule Take 20 mg by mouth daily.    Historical Provider, MD  warfarin (COUMADIN) 5 MG tablet TAKE BY MOUTH DAILY AS DIRECTED BY COUMADIN CLINIC 09/09/15   Skeet Latch, MD        ROS:  Out of a complete 14 system review of symptoms, the patient complains only of the following symptoms, and all other reviewed systems are negative.  Snoring Loss of vision Easy bruising, easy bleeding Joint pain Depression  Blood pressure 135/81, pulse 122, height 5' 10.5" (1.791 m), weight 209 lb 8 oz (95.029 kg).  Physical Exam  General: The patient is alert and cooperative at the time of the examination.  Eyes: Pupils are equal, round, and reactive to light. Discs are flat bilaterally.  Neck: The neck is supple, no carotid bruits are noted.  Respiratory: The respiratory examination is clear.  Cardiovascular: The cardiovascular examination reveals a regular rate and rhythm, no obvious murmurs or rubs are noted.  Skin: Extremities are without significant edema. The AV fistula is in the left arm.  Neurologic Exam  Mental status: The patient is alert and oriented x 3 at the time of the examination. The patient has apparent normal recent and remote memory, with an apparently normal attention span and concentration ability.  Cranial nerves: Facial symmetry is present. There is good sensation of the face to pinprick and soft touch bilaterally. The strength of the facial muscles and the muscles to head turning and shoulder shrug are normal bilaterally. Speech is well enunciated, no aphasia or dysarthria is noted. Extraocular movements are full. Visual fields are patchy, the patient has very little central vision. He is able to count fingers. The tongue is midline, and the patient has symmetric elevation of the soft palate. No obvious hearing deficits are  noted.  Motor: The motor testing reveals 5 over 5 strength of all 4 extremities. Good symmetric motor tone is noted throughout.  Sensory: Sensory testing is intact to pinprick, soft touch, vibration sensation, and position sense on all 4 extremities, with exception of a stocking pattern pinprick sensory deficit in the distal third of the legs bilaterally, and some impairment of vibration sensation in both feet. No evidence of extinction is noted.  Coordination: Cerebellar testing reveals good finger-nose-finger and heel-to-shin bilaterally. Tinel's sign at the wrists are positive bilaterally.  Gait and station: Gait is slightly wide-based. Tandem gait was not attempted. Romberg is unsteady, the patient does not fall. No drift is seen.  Reflexes: Deep  tendon reflexes are symmetric, but is slightly depressed bilaterally. Toes are downgoing bilaterally.   Assessment/Plan:  1. Bilateral hand pain  2. Diabetic peripheral neuropathy  The patient does appear to have a peripheral neuropathy that is likely related to diabetes. The patient had been treated with amiodarone as well in the past which can produce a peripheral neuropathy. The patient will be set up for nerve conduction studies on both arms and on one leg and EMG will be done on the right arm. The patient could have carpal tunnel syndrome as the etiology for his discomfort in the hands. He will follow-up for the above study.  Jill Alexanders MD 10/02/2015 10:36 AM  [facility] [address] [place], [place] [address]  Phone [phone] Fax [phone]

## 2015-10-03 DIAGNOSIS — D631 Anemia in chronic kidney disease: Secondary | ICD-10-CM | POA: Diagnosis not present

## 2015-10-03 DIAGNOSIS — E1129 Type 2 diabetes mellitus with other diabetic kidney complication: Secondary | ICD-10-CM | POA: Diagnosis not present

## 2015-10-03 DIAGNOSIS — N186 End stage renal disease: Secondary | ICD-10-CM | POA: Diagnosis not present

## 2015-10-04 ENCOUNTER — Emergency Department (HOSPITAL_COMMUNITY)
Admission: EM | Admit: 2015-10-04 | Discharge: 2015-10-05 | Disposition: A | Discharge status: Home / Self Care | Payer: Medicare Other | Attending: Emergency Medicine | Admitting: Emergency Medicine

## 2015-10-04 ENCOUNTER — Emergency Department (HOSPITAL_COMMUNITY): Payer: Medicare Other

## 2015-10-04 ENCOUNTER — Encounter (HOSPITAL_COMMUNITY): Payer: Self-pay | Admitting: Emergency Medicine

## 2015-10-04 DIAGNOSIS — N186 End stage renal disease: Secondary | ICD-10-CM | POA: Insufficient documentation

## 2015-10-04 DIAGNOSIS — G9389 Other specified disorders of brain: Secondary | ICD-10-CM | POA: Insufficient documentation

## 2015-10-04 DIAGNOSIS — Z7901 Long term (current) use of anticoagulants: Secondary | ICD-10-CM | POA: Insufficient documentation

## 2015-10-04 DIAGNOSIS — Z87891 Personal history of nicotine dependence: Secondary | ICD-10-CM | POA: Diagnosis not present

## 2015-10-04 DIAGNOSIS — Z992 Dependence on renal dialysis: Secondary | ICD-10-CM | POA: Insufficient documentation

## 2015-10-04 DIAGNOSIS — S199XXA Unspecified injury of neck, initial encounter: Secondary | ICD-10-CM | POA: Diagnosis not present

## 2015-10-04 DIAGNOSIS — R079 Chest pain, unspecified: Secondary | ICD-10-CM | POA: Diagnosis not present

## 2015-10-04 DIAGNOSIS — Z79899 Other long term (current) drug therapy: Secondary | ICD-10-CM | POA: Insufficient documentation

## 2015-10-04 DIAGNOSIS — I120 Hypertensive chronic kidney disease with stage 5 chronic kidney disease or end stage renal disease: Secondary | ICD-10-CM | POA: Insufficient documentation

## 2015-10-04 DIAGNOSIS — Y939 Activity, unspecified: Secondary | ICD-10-CM | POA: Insufficient documentation

## 2015-10-04 DIAGNOSIS — R10819 Abdominal tenderness, unspecified site: Secondary | ICD-10-CM | POA: Diagnosis not present

## 2015-10-04 DIAGNOSIS — E1122 Type 2 diabetes mellitus with diabetic chronic kidney disease: Secondary | ICD-10-CM | POA: Diagnosis not present

## 2015-10-04 DIAGNOSIS — S0990XA Unspecified injury of head, initial encounter: Secondary | ICD-10-CM | POA: Diagnosis not present

## 2015-10-04 DIAGNOSIS — Z8673 Personal history of transient ischemic attack (TIA), and cerebral infarction without residual deficits: Secondary | ICD-10-CM | POA: Diagnosis not present

## 2015-10-04 DIAGNOSIS — Y929 Unspecified place or not applicable: Secondary | ICD-10-CM | POA: Insufficient documentation

## 2015-10-04 DIAGNOSIS — S3991XA Unspecified injury of abdomen, initial encounter: Secondary | ICD-10-CM | POA: Diagnosis not present

## 2015-10-04 DIAGNOSIS — J984 Other disorders of lung: Secondary | ICD-10-CM | POA: Diagnosis not present

## 2015-10-04 DIAGNOSIS — E1142 Type 2 diabetes mellitus with diabetic polyneuropathy: Secondary | ICD-10-CM | POA: Diagnosis not present

## 2015-10-04 DIAGNOSIS — R1011 Right upper quadrant pain: Secondary | ICD-10-CM | POA: Diagnosis present

## 2015-10-04 DIAGNOSIS — Y999 Unspecified external cause status: Secondary | ICD-10-CM | POA: Diagnosis not present

## 2015-10-04 DIAGNOSIS — I480 Paroxysmal atrial fibrillation: Secondary | ICD-10-CM | POA: Insufficient documentation

## 2015-10-04 DIAGNOSIS — S299XXA Unspecified injury of thorax, initial encounter: Secondary | ICD-10-CM | POA: Diagnosis not present

## 2015-10-04 DIAGNOSIS — S3690XA Unspecified injury of unspecified intra-abdominal organ, initial encounter: Secondary | ICD-10-CM | POA: Diagnosis not present
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
## 2015-10-05 ENCOUNTER — Emergency Department (HOSPITAL_COMMUNITY): Payer: Medicare Other

## 2015-10-05 DIAGNOSIS — R079 Chest pain, unspecified: Secondary | ICD-10-CM | POA: Diagnosis not present

## 2015-10-05 DIAGNOSIS — S299XXA Unspecified injury of thorax, initial encounter: Secondary | ICD-10-CM | POA: Diagnosis not present

## 2015-10-05 DIAGNOSIS — S3991XA Unspecified injury of abdomen, initial encounter: Secondary | ICD-10-CM | POA: Diagnosis not present

## 2015-10-05 DIAGNOSIS — I480 Paroxysmal atrial fibrillation: Secondary | ICD-10-CM | POA: Diagnosis not present

## 2015-10-05 DIAGNOSIS — S0990XA Unspecified injury of head, initial encounter: Secondary | ICD-10-CM | POA: Diagnosis not present

## 2015-10-05 DIAGNOSIS — S199XXA Unspecified injury of neck, initial encounter: Secondary | ICD-10-CM | POA: Diagnosis not present

## 2015-10-05 LAB — COMPREHENSIVE METABOLIC PANEL
ALT: 20 U/L (ref 17–63)
AST: 22 U/L (ref 15–41)
Albumin: 3.4 g/dL — ABNORMAL LOW (ref 3.5–5.0)
Alkaline Phosphatase: 72 U/L (ref 38–126)
Anion gap: 10 (ref 5–15)
BUN: 37 mg/dL — AB (ref 6–20)
CHLORIDE: 100 mmol/L — AB (ref 101–111)
CO2: 25 mmol/L (ref 22–32)
Calcium: 8.5 mg/dL — ABNORMAL LOW (ref 8.9–10.3)
Creatinine, Ser: 6.85 mg/dL — ABNORMAL HIGH (ref 0.61–1.24)
GFR calc Af Amer: 8 mL/min — ABNORMAL LOW (ref >60)
GFR, EST NON AFRICAN AMERICAN: 7 mL/min — AB (ref >60)
Glucose, Bld: 123 mg/dL — ABNORMAL HIGH (ref 65–99)
POTASSIUM: 3.8 mmol/L (ref 3.5–5.1)
SODIUM: 135 mmol/L (ref 135–145)
Total Bilirubin: 0.5 mg/dL (ref 0.3–1.2)
Total Protein: 6.1 g/dL — ABNORMAL LOW (ref 6.5–8.1)

## 2015-10-05 LAB — CBC
HEMATOCRIT: 32.6 % — AB (ref 39.0–52.0)
Hemoglobin: 11 g/dL — ABNORMAL LOW (ref 13.0–17.0)
MCH: 34.5 pg — ABNORMAL HIGH (ref 26.0–34.0)
MCHC: 33.7 g/dL (ref 30.0–36.0)
MCV: 102.2 fL — AB (ref 78.0–100.0)
Platelets: 209 10*3/uL (ref 150–400)
RBC: 3.19 MIL/uL — AB (ref 4.22–5.81)
RDW: 15.4 % (ref 11.5–15.5)
WBC: 10.2 10*3/uL (ref 4.0–10.5)

## 2015-10-05 LAB — I-STAT TROPONIN, ED: Troponin i, poc: 0.01 ng/mL (ref 0.00–0.08)

## 2015-10-05 LAB — PROTIME-INR
INR: 1.77 — ABNORMAL HIGH (ref 0.00–1.49)
Prothrombin Time: 20.6 seconds — ABNORMAL HIGH (ref 11.6–15.2)

## 2015-10-05 LAB — SAMPLE TO BLOOD BANK

## 2015-10-05 MED ORDER — HYDROCODONE-ACETAMINOPHEN 5-325 MG PO TABS: 1.0000 | ORAL_TABLET | ORAL | Status: DC | PRN

## 2015-10-05 MED ORDER — IOPAMIDOL (ISOVUE-300) INJECTION 61%
INTRAVENOUS | Status: AC
  Administered 2015-10-05: 100 mL
  Filled 2015-10-05: qty 100

## 2015-10-05 NOTE — ED Notes (Signed)
Taken to CT at this time. 
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
## 2015-10-05 NOTE — Discharge Instructions (Signed)

## 2015-10-06 DIAGNOSIS — E1129 Type 2 diabetes mellitus with other diabetic kidney complication: Secondary | ICD-10-CM | POA: Diagnosis not present

## 2015-10-06 DIAGNOSIS — D631 Anemia in chronic kidney disease: Secondary | ICD-10-CM | POA: Diagnosis not present

## 2015-10-06 DIAGNOSIS — N186 End stage renal disease: Secondary | ICD-10-CM | POA: Diagnosis not present

## 2015-10-06 MED ORDER — IOPAMIDOL (ISOVUE-300) INJECTION 61%
100.0000 mL | Freq: Once | INTRAVENOUS | Status: AC | PRN
  Administered 2015-10-05: 100 mL via INTRAVENOUS
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
## 2015-10-07 ENCOUNTER — Other Ambulatory Visit: Payer: Self-pay | Admitting: Cardiology

## 2015-10-07 ENCOUNTER — Encounter: Payer: Self-pay | Admitting: Cardiology

## 2015-10-07 ENCOUNTER — Ambulatory Visit (INDEPENDENT_AMBULATORY_CARE_PROVIDER_SITE_OTHER): Payer: Medicare Other | Admitting: Cardiology

## 2015-10-07 VITALS — BP 104/70 | HR 111 | Ht 70.5 in | Wt 208.0 lb

## 2015-10-07 DIAGNOSIS — I481 Persistent atrial fibrillation: Secondary | ICD-10-CM | POA: Diagnosis not present

## 2015-10-07 DIAGNOSIS — I4819 Other persistent atrial fibrillation: Secondary | ICD-10-CM

## 2015-10-07 LAB — CUP PACEART INCLINIC DEVICE CHECK
Battery Remaining Longevity: 114
Implantable Lead Implant Date: 20161213
Implantable Lead Location: 753859
Lead Channel Impedance Value: 450 Ohm
Lead Channel Pacing Threshold Amplitude: 1 V
Lead Channel Sensing Intrinsic Amplitude: 11.9 mV
Lead Channel Setting Pacing Amplitude: 1.75 V
Lead Channel Setting Sensing Sensitivity: 2 mV
MDC IDC LEAD IMPLANT DT: 20161213
MDC IDC LEAD LOCATION: 753860
MDC IDC MSMT BATTERY VOLTAGE: 3.02 V
MDC IDC MSMT LEADCHNL RA IMPEDANCE VALUE: 412.5 Ohm
MDC IDC MSMT LEADCHNL RA SENSING INTR AMPL: 0.7 mV
MDC IDC MSMT LEADCHNL RV PACING THRESHOLD PULSEWIDTH: 0.5 ms
MDC IDC SESS DTM: 20170711113005
MDC IDC SET LEADCHNL RV PACING AMPLITUDE: 5 V
MDC IDC SET LEADCHNL RV PACING PULSEWIDTH: 0.5 ms
MDC IDC STAT BRADY RA PERCENT PACED: 28 %
MDC IDC STAT BRADY RV PERCENT PACED: 3.8 %
Pulse Gen Serial Number: 7839215

## 2015-10-07 MED ORDER — AMIODARONE HCL 200 MG PO TABS: 200.0000 mg | ORAL_TABLET | Freq: Every day | ORAL | Status: DC

## 2015-10-07 NOTE — Telephone Encounter (Signed)
Pharmacy is requesting refill on this medication. The office note says patient will need 3 weeks of therapeutic INRs and then we'll start amiodarone. Please advise.

## 2015-10-07 NOTE — Telephone Encounter (Signed)
Ok to fill med.   Patient understands I will call him with directions on when to start taking this medication.  But pharmacy may go ahead and fill it now.

## 2015-10-07 NOTE — Patient Instructions (Signed)
Medication Instructions:  Your physician has recommended you make the following change in your medication:  1) START Amiodarone  (DO NOT START THIS MEDICATION UNTIL NURSE CALLS YOU)  - take 200 mg twice a day for 1 week, then reduce and take 200 mg daily  ---  If you need a refill on your cardiac medications before your next appointment, please call your pharmacy.  Labwork: None ordered  Testing/Procedures: None ordered  Follow-Up: Nurse will call you to arrange follow up in several weeks.  Thank you for choosing [facility]!!   Trinidad Curet, RN [phone]   Any Other Special Instructions Will Be Listed Below (If Applicable).  Amiodarone tablets What is this medicine? AMIODARONE (a MEE oh da rone) is an antiarrhythmic drug. It helps make your heart beat regularly. Because of the side effects caused by this medicine, it is only used when other medicines have not worked. It is usually used for heartbeat problems that may be life threatening. This medicine may be used for other purposes; ask your health care provider or pharmacist if you have questions. What should I tell my health care provider before I take this medicine? They need to know if you have any of these conditions: -liver disease -lung disease -other heart problems -thyroid disease -an unusual or allergic reaction to amiodarone, iodine, other medicines, foods, dyes, or preservatives -pregnant or trying to get pregnant -breast-feeding How should I use this medicine? Take this medicine by mouth with a glass of water. Follow the directions on the prescription label. You can take this medicine with or without food. However, you should always take it the same way each time. Take your doses at regular intervals. Do not take your medicine more often than directed. Do not stop taking except on the advice of your doctor or health care professional. A special MedGuide will be given to you by the pharmacist with each  prescription and refill. Be sure to read this information carefully each time. Talk to your pediatrician regarding the use of this medicine in children. Special care may be needed. Overdosage: If you think you have taken too much of this medicine contact a poison control center or emergency room at once. NOTE: This medicine is only for you. Do not share this medicine with others. What if I miss a dose? If you miss a dose, take it as soon as you can. If it is almost time for your next dose, take only that dose. Do not take double or extra doses. What may interact with this medicine? Do not take this medicine with any of the following medications: -abarelix -apomorphine -arsenic trioxide -certain antibiotics like erythromycin, gemifloxacin, levofloxacin, pentamidine -certain medicines for depression like amoxapine, tricyclic antidepressants -certain medicines for fungal infections like fluconazole, itraconazole, ketoconazole, posaconazole, voriconazole -certain medicines for irregular heart beat like disopyramide, dofetilide, dronedarone, ibutilide, propafenone, sotalol -certain medicines for malaria like chloroquine, halofantrine -cisapride -droperidol -haloperidol -hawthorn -maprotiline -methadone -phenothiazines like chlorpromazine, mesoridazine, thioridazine -pimozide -ranolazine -red yeast rice -vardenafil -ziprasidone This medicine may also interact with the following medications: -antiviral medicines for HIV or AIDS -certain medicines for blood pressure, heart disease, irregular heart beat -certain medicines for cholesterol like atorvastatin, cerivastatin, lovastatin, simvastatin -certain medicines for hepatitis C like sofosbuvir and ledipasvir; sofosbuvir -certain medicines for seizures like phenytoin -certain medicines for thyroid problems -certain medicines that treat or prevent blood clots like  warfarin -cholestyramine -cimetidine -clopidogrel -cyclosporine -dextromethorphan -diuretics -fentanyl -general anesthetics -grapefruit juice -lidocaine -loratadine -methotrexate -other medicines that  prolong the QT interval (cause an abnormal heart rhythm) -procainamide -quinidine -rifabutin, rifampin, or rifapentine -St. John's Wort -trazodone This list may not describe all possible interactions. Give your health care provider a list of all the medicines, herbs, non-prescription drugs, or dietary supplements you use. Also tell them if you smoke, drink alcohol, or use illegal drugs. Some items may interact with your medicine. What should I watch for while using this medicine? Your condition will be monitored closely when you first begin therapy. Often, this drug is first started in a hospital or other monitored health care setting. Once you are on maintenance therapy, visit your doctor or health care professional for regular checks on your progress. Because your condition and use of this medicine carry some risk, it is a good idea to carry an identification card, necklace or bracelet with details of your condition, medications, and doctor or health care professional. Dennis Bast may get drowsy or dizzy. Do not drive, use machinery, or do anything that needs mental alertness until you know how this medicine affects you. Do not stand or sit up quickly, especially if you are an older patient. This reduces the risk of dizzy or fainting spells. This medicine can make you more sensitive to the sun. Keep out of the sun. If you cannot avoid being in the sun, wear protective clothing and use sunscreen. Do not use sun lamps or tanning beds/booths. You should have regular eye exams before and during treatment. Call your doctor if you have blurred vision, see halos, or your eyes become sensitive to light. Your eyes may get dry. It may be helpful to use a lubricating eye solution or artificial tears  solution. If you are going to have surgery or a procedure that requires contrast dyes, tell your doctor or health care professional that you are taking this medicine. What side effects may I notice from receiving this medicine? Side effects that you should report to your doctor or health care professional as soon as possible: -allergic reactions like skin rash, itching or hives, swelling of the face, lips, or tongue -blue-gray coloring of the skin -blurred vision, seeing blue green halos, increased sensitivity of the eyes to light -breathing problems -chest pain -dark urine -fast, irregular heartbeat -feeling faint or light-headed -intolerance to heat or cold -nausea or vomiting -pain and swelling of the scrotum -pain, tingling, numbness in feet, hands -redness, blistering, peeling or loosening of the skin, including inside the mouth -spitting up blood -stomach pain -sweating -unusual or uncontrolled movements of body -unusually weak or tired -weight gain or loss -yellowing of the eyes or skin Side effects that usually do not require medical attention (report to your doctor or health care professional if they continue or are bothersome): -change in sex drive or performance -constipation -dizziness -headache -loss of appetite -trouble sleeping This list may not describe all possible side effects. Call your doctor for medical advice about side effects. You may report side effects to FDA at 1-800-FDA-1088. Where should I keep my medicine? Keep out of the reach of children. Store at room temperature between 20 and 25 degrees C (68 and 77 degrees F). Protect from light. Keep container tightly closed. Throw away any unused medicine after the expiration date. NOTE: This sheet is a summary. It may not cover all possible information. If you have questions about this medicine, talk to your doctor, pharmacist, or health care provider.    2016, [employer] Standard. (2013-06-18  19:48:11)

## 2015-10-08 DIAGNOSIS — E1129 Type 2 diabetes mellitus with other diabetic kidney complication: Secondary | ICD-10-CM | POA: Diagnosis not present

## 2015-10-08 DIAGNOSIS — D631 Anemia in chronic kidney disease: Secondary | ICD-10-CM | POA: Diagnosis not present

## 2015-10-08 DIAGNOSIS — N186 End stage renal disease: Secondary | ICD-10-CM | POA: Diagnosis not present

## 2015-10-10 ENCOUNTER — Ambulatory Visit (INDEPENDENT_AMBULATORY_CARE_PROVIDER_SITE_OTHER): Payer: Medicare Other | Admitting: Pharmacist Clinician (PhC)/ Clinical Pharmacy Specialist

## 2015-10-10 DIAGNOSIS — I480 Paroxysmal atrial fibrillation: Secondary | ICD-10-CM | POA: Diagnosis not present

## 2015-10-10 DIAGNOSIS — Z7901 Long term (current) use of anticoagulants: Secondary | ICD-10-CM

## 2015-10-10 DIAGNOSIS — E1129 Type 2 diabetes mellitus with other diabetic kidney complication: Secondary | ICD-10-CM | POA: Diagnosis not present

## 2015-10-10 DIAGNOSIS — N186 End stage renal disease: Secondary | ICD-10-CM | POA: Diagnosis not present

## 2015-10-10 DIAGNOSIS — D631 Anemia in chronic kidney disease: Secondary | ICD-10-CM | POA: Diagnosis not present

## 2015-10-10 LAB — POCT INR: INR: 2

## 2015-10-13 DIAGNOSIS — E1129 Type 2 diabetes mellitus with other diabetic kidney complication: Secondary | ICD-10-CM | POA: Diagnosis not present

## 2015-10-13 DIAGNOSIS — D631 Anemia in chronic kidney disease: Secondary | ICD-10-CM | POA: Diagnosis not present

## 2015-10-13 DIAGNOSIS — N186 End stage renal disease: Secondary | ICD-10-CM | POA: Diagnosis not present

## 2015-10-15 DIAGNOSIS — E1129 Type 2 diabetes mellitus with other diabetic kidney complication: Secondary | ICD-10-CM | POA: Diagnosis not present

## 2015-10-15 DIAGNOSIS — D631 Anemia in chronic kidney disease: Secondary | ICD-10-CM | POA: Diagnosis not present

## 2015-10-15 DIAGNOSIS — N186 End stage renal disease: Secondary | ICD-10-CM | POA: Diagnosis not present

## 2015-10-17 ENCOUNTER — Ambulatory Visit (INDEPENDENT_AMBULATORY_CARE_PROVIDER_SITE_OTHER): Payer: Medicare Other | Admitting: Cardiovascular Disease

## 2015-10-17 ENCOUNTER — Ambulatory Visit (INDEPENDENT_AMBULATORY_CARE_PROVIDER_SITE_OTHER): Payer: Medicare Other | Admitting: Pharmacist

## 2015-10-17 VITALS — BP 113/77 | HR 73 | Ht 70.5 in | Wt 208.2 lb

## 2015-10-17 DIAGNOSIS — Z7901 Long term (current) use of anticoagulants: Secondary | ICD-10-CM | POA: Diagnosis not present

## 2015-10-17 DIAGNOSIS — E785 Hyperlipidemia, unspecified: Secondary | ICD-10-CM | POA: Diagnosis not present

## 2015-10-17 DIAGNOSIS — I480 Paroxysmal atrial fibrillation: Secondary | ICD-10-CM | POA: Diagnosis not present

## 2015-10-17 DIAGNOSIS — I350 Nonrheumatic aortic (valve) stenosis: Secondary | ICD-10-CM | POA: Diagnosis not present

## 2015-10-17 DIAGNOSIS — N186 End stage renal disease: Secondary | ICD-10-CM | POA: Diagnosis not present

## 2015-10-17 DIAGNOSIS — I951 Orthostatic hypotension: Secondary | ICD-10-CM

## 2015-10-17 DIAGNOSIS — E1129 Type 2 diabetes mellitus with other diabetic kidney complication: Secondary | ICD-10-CM | POA: Diagnosis not present

## 2015-10-17 DIAGNOSIS — D631 Anemia in chronic kidney disease: Secondary | ICD-10-CM | POA: Diagnosis not present

## 2015-10-17 LAB — POCT INR: INR: 2.1
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
## 2015-10-17 NOTE — Patient Instructions (Signed)
Medication Instructions:  Your physician recommends that you continue on your current medications as directed. Please refer to the Current Medication list given to you today.  Labwork: Fasting lipid panel at [facility] lab on the first floor soon, only water to drink morning of  Testing/Procedures: none  Follow-Up: Your physician recommends that you schedule a follow-up appointment in: 4 month ov  If you need a refill on your cardiac medications before your next appointment, please call your pharmacy.

## 2015-10-20 ENCOUNTER — Encounter: Payer: Self-pay | Admitting: Cardiovascular Disease

## 2015-10-20 DIAGNOSIS — E1129 Type 2 diabetes mellitus with other diabetic kidney complication: Secondary | ICD-10-CM | POA: Diagnosis not present

## 2015-10-20 DIAGNOSIS — D631 Anemia in chronic kidney disease: Secondary | ICD-10-CM | POA: Diagnosis not present

## 2015-10-20 DIAGNOSIS — N186 End stage renal disease: Secondary | ICD-10-CM | POA: Diagnosis not present

## 2015-10-22 DIAGNOSIS — I482 Chronic atrial fibrillation: Secondary | ICD-10-CM | POA: Diagnosis not present

## 2015-10-22 DIAGNOSIS — D631 Anemia in chronic kidney disease: Secondary | ICD-10-CM | POA: Diagnosis not present

## 2015-10-22 DIAGNOSIS — E1129 Type 2 diabetes mellitus with other diabetic kidney complication: Secondary | ICD-10-CM | POA: Diagnosis not present

## 2015-10-22 DIAGNOSIS — N186 End stage renal disease: Secondary | ICD-10-CM | POA: Diagnosis not present

## 2015-10-23 LAB — PROTIME-INR: INR: 2.2 — AB (ref 0.9–1.1)

## 2015-10-24 ENCOUNTER — Ambulatory Visit (INDEPENDENT_AMBULATORY_CARE_PROVIDER_SITE_OTHER): Payer: Medicare Other | Admitting: Pharmacist Clinician (PhC)/ Clinical Pharmacy Specialist

## 2015-10-24 DIAGNOSIS — Z7901 Long term (current) use of anticoagulants: Secondary | ICD-10-CM | POA: Diagnosis not present

## 2015-10-24 DIAGNOSIS — I480 Paroxysmal atrial fibrillation: Secondary | ICD-10-CM

## 2015-10-24 DIAGNOSIS — N186 End stage renal disease: Secondary | ICD-10-CM | POA: Diagnosis not present

## 2015-10-24 DIAGNOSIS — D631 Anemia in chronic kidney disease: Secondary | ICD-10-CM | POA: Diagnosis not present

## 2015-10-24 DIAGNOSIS — E1129 Type 2 diabetes mellitus with other diabetic kidney complication: Secondary | ICD-10-CM | POA: Diagnosis not present

## 2015-10-24 LAB — POCT INR: INR: 2.4

## 2015-10-27 ENCOUNTER — Telehealth: Payer: Self-pay | Admitting: Cardiovascular Disease

## 2015-10-27 DIAGNOSIS — E1129 Type 2 diabetes mellitus with other diabetic kidney complication: Secondary | ICD-10-CM | POA: Diagnosis not present

## 2015-10-27 DIAGNOSIS — D631 Anemia in chronic kidney disease: Secondary | ICD-10-CM | POA: Diagnosis not present

## 2015-10-27 DIAGNOSIS — Z992 Dependence on renal dialysis: Secondary | ICD-10-CM | POA: Diagnosis not present

## 2015-10-27 DIAGNOSIS — N186 End stage renal disease: Secondary | ICD-10-CM | POA: Diagnosis not present

## 2015-10-27 NOTE — Telephone Encounter (Signed)
New Message  Pt c/o medication issue:  1. Name of Medication: Amiodarone  2. How are you currently taking this medication (dosage and times per day)? Not taking as of yet  3. Are you having a reaction (difficulty breathing--STAT)? No  4. What is your medication issue? Someone is suppose to call to confirm him to take his medication. Pt currently does have medication to start but is needing confirmation to take prior to appt on 10/31/15.

## 2015-10-27 NOTE — Telephone Encounter (Signed)
Return patient call:  Informed patient that per note he needed one more therapeutic INR check in order to have the DCCV and begin the amiodarone.  Pt is scheduled for 8/4 for 3rd INR check.  Pt verbalized understanding.

## 2015-10-28 DIAGNOSIS — E785 Hyperlipidemia, unspecified: Secondary | ICD-10-CM | POA: Diagnosis not present

## 2015-10-29 DIAGNOSIS — N186 End stage renal disease: Secondary | ICD-10-CM | POA: Diagnosis not present

## 2015-10-29 DIAGNOSIS — E1129 Type 2 diabetes mellitus with other diabetic kidney complication: Secondary | ICD-10-CM | POA: Diagnosis not present

## 2015-10-29 DIAGNOSIS — D631 Anemia in chronic kidney disease: Secondary | ICD-10-CM | POA: Diagnosis not present

## 2015-10-29 LAB — LIPID PANEL
Cholesterol: 138 mg/dL (ref 125–200)
HDL: 30 mg/dL — ABNORMAL LOW (ref >=40)
LDL CALC: 76 mg/dL (ref <130)
Total CHOL/HDL Ratio: 4.6 Ratio (ref <=5.0)
Triglycerides: 158 mg/dL — ABNORMAL HIGH (ref <150)
VLDL: 32 mg/dL — AB (ref <30)

## 2015-10-31 ENCOUNTER — Telehealth: Payer: Self-pay | Admitting: *Deleted

## 2015-10-31 ENCOUNTER — Ambulatory Visit (INDEPENDENT_AMBULATORY_CARE_PROVIDER_SITE_OTHER): Payer: Medicare Other | Admitting: Pharmacist

## 2015-10-31 DIAGNOSIS — Z7901 Long term (current) use of anticoagulants: Secondary | ICD-10-CM

## 2015-10-31 DIAGNOSIS — I480 Paroxysmal atrial fibrillation: Secondary | ICD-10-CM | POA: Diagnosis not present

## 2015-10-31 DIAGNOSIS — D631 Anemia in chronic kidney disease: Secondary | ICD-10-CM | POA: Diagnosis not present

## 2015-10-31 DIAGNOSIS — N186 End stage renal disease: Secondary | ICD-10-CM | POA: Diagnosis not present

## 2015-10-31 DIAGNOSIS — E1129 Type 2 diabetes mellitus with other diabetic kidney complication: Secondary | ICD-10-CM | POA: Diagnosis not present

## 2015-10-31 LAB — POCT INR: INR: 2.6

## 2015-10-31 NOTE — Telephone Encounter (Signed)
Patient seen on 7/11 by Dr. Curt Bears.  Camnitz wanted patient to start Amiodarone and schedule DCCV, but patient is on Coumadin and has been subtherapeutic since January. He is now been therapeutic for past 4 weeks.  Left message for patient to call back. When he calls back he needs to start Amiodarone and be scheduled for DCCV. (Amiodarone instructions  - take 200 mg twice a day for 1 week, then reduce and take 200 mg daily)  Routing to triage to advise patient when he calls back.

## 2015-11-03 ENCOUNTER — Encounter: Payer: Self-pay | Admitting: *Deleted

## 2015-11-03 ENCOUNTER — Encounter: Payer: Self-pay | Admitting: Neurology

## 2015-11-03 ENCOUNTER — Other Ambulatory Visit: Payer: Self-pay | Admitting: Cardiology

## 2015-11-03 ENCOUNTER — Ambulatory Visit (INDEPENDENT_AMBULATORY_CARE_PROVIDER_SITE_OTHER): Payer: Self-pay | Admitting: Neurology

## 2015-11-03 ENCOUNTER — Ambulatory Visit (INDEPENDENT_AMBULATORY_CARE_PROVIDER_SITE_OTHER): Payer: Medicare Other | Admitting: Neurology

## 2015-11-03 DIAGNOSIS — G5601 Carpal tunnel syndrome, right upper limb: Secondary | ICD-10-CM | POA: Diagnosis not present

## 2015-11-03 DIAGNOSIS — G609 Hereditary and idiopathic neuropathy, unspecified: Secondary | ICD-10-CM

## 2015-11-03 DIAGNOSIS — G5603 Carpal tunnel syndrome, bilateral upper limbs: Secondary | ICD-10-CM | POA: Insufficient documentation

## 2015-11-03 DIAGNOSIS — N186 End stage renal disease: Secondary | ICD-10-CM | POA: Diagnosis not present

## 2015-11-03 DIAGNOSIS — G5602 Carpal tunnel syndrome, left upper limb: Secondary | ICD-10-CM

## 2015-11-03 DIAGNOSIS — D631 Anemia in chronic kidney disease: Secondary | ICD-10-CM | POA: Diagnosis not present

## 2015-11-03 DIAGNOSIS — M79643 Pain in unspecified hand: Secondary | ICD-10-CM

## 2015-11-03 DIAGNOSIS — E1129 Type 2 diabetes mellitus with other diabetic kidney complication: Secondary | ICD-10-CM | POA: Diagnosis not present

## 2015-11-03 DIAGNOSIS — E1142 Type 2 diabetes mellitus with diabetic polyneuropathy: Secondary | ICD-10-CM

## 2015-11-03 HISTORY — DX: Carpal tunnel syndrome, bilateral upper limbs: G56.03

## 2015-11-03 NOTE — Procedures (Signed)
     HISTORY:  Her Alexander [name] is a 75 year old gentleman with a history of diabetes and end-stage renal disease who has had a two-year history of hand numbness. The patient oftentimes wakes up at night with numbness. He has discomfort in the hands as well. He is being evaluated for a possible neuropathy or a cervical radiculopathy.  NERVE CONDUCTION STUDIES:  Nerve conduction studies were performed on both upper extremities. The distal motor latencies for the median and ulnar nerves were prolonged bilaterally with low motor amplitudes for the left median and ulnar nerves, normal on the right. The nerve conduction velocities for the median and ulnar nerves were slowed bilaterally. The sensory latencies for the median and ulnar nerves on the right were unobtainable, on the left the median sensory latency was unobtainable, and was prolonged for the left ulnar nerve.  Nerve conduction studies were performed on the right lower extremity. The distal motor latencies for the right peroneal and posterior tibial nerves were normal with low motor amplitudes seen for these nerves. Slowing was seen above the knee for the peroneal nerve, slowed for the right posterior tibial nerve. The right sural and peroneal sensory latencies were unobtainable.  EMG STUDIES:  EMG study was performed on the right upper extremity:  The first dorsal interosseous muscle reveals 2 to 4 K units with full recruitment. No fibrillations or positive waves were noted. The abductor pollicis brevis muscle reveals 2 to 5 K units with decreased recruitment. No fibrillations or positive waves were noted. The extensor indicis proprius muscle reveals 1 to 3 K units with full recruitment. No fibrillations or positive waves were noted. The pronator teres muscle reveals 2 to 3 K units with full recruitment. No fibrillations or positive waves were noted. The biceps muscle reveals 1 to 2 K units with full recruitment. No fibrillations or positive  waves were noted. The triceps muscle reveals 2 to 4 K units with full recruitment. No fibrillations or positive waves were noted. The anterior deltoid muscle reveals 2 to 3 K units with full recruitment. No fibrillations or positive waves were noted. The cervical paraspinal muscles were tested at 2 levels. No abnormalities of insertional activity were seen at either level tested. There was good relaxation.   IMPRESSION:  Nerve conduction studies done on both upper extremities and on the right lower extremity shows evidence of a significant generalized peripheral neuropathy of mixed axonal and demyelinating type. The patient does have bilateral carpal tunnel syndrome of moderate severity. EMG evaluation of the right upper extremity was relatively unremarkable without evidence of an overlying cervical radiculopathy.  Jill Alexanders MD 11/03/2015 4:57 PM  [facility] [address] [place], [place] [address]  Phone [phone] Fax [phone]

## 2015-11-03 NOTE — Progress Notes (Signed)
Alexander [name] comes in today for EMG nerve conduction study evaluation. Nerve conduction studies do show evidence of a generalized peripheral neuropathy affecting the arms and legs. The patient does have bilateral carpal tunnel syndrome. EMG of the right arm does not show an overlying cervical radiculopathy. The symptoms in the hands wake him up at night, the symptoms are worse on the left than the right.  We will try wrist splints for the next 6-8 weeks. If this offers no benefit, he is to contact our office and we will send him to a Copy.

## 2015-11-03 NOTE — Telephone Encounter (Signed)
Pt's wife advised DCCV has been scheduled for 11/13/15 11AM with Dr Johnsie Cancel. Pt's wife advised pt should start amiodarone today 200mg  bid for 1 week then decrease to 200mg  daily. Pt's wife advised I will ask [facility] to contact her to arrange an appt for PT/INR for pt the morning of cardioversion 11/13/15, pt does have PT/INR already scheduled for 11/07/15.  Pt's wife advised instructions mailed to pt.   I spoke with [employer] rep, he is aware of cardioversion date and time. Case number for cardioversion [id number].

## 2015-11-03 NOTE — Telephone Encounter (Signed)
Patient's wife, [name], was returning Sherri's call regarding medication changes.  Please call the patient at home today.

## 2015-11-03 NOTE — Progress Notes (Signed)
Please refer to EMG and nerve conduction study procedure note. 

## 2015-11-03 NOTE — Telephone Encounter (Signed)
In Basket message to prior authorization.

## 2015-11-04 DIAGNOSIS — I63032 Cerebral infarction due to thrombosis of left carotid artery: Secondary | ICD-10-CM | POA: Diagnosis not present

## 2015-11-04 DIAGNOSIS — N186 End stage renal disease: Secondary | ICD-10-CM | POA: Diagnosis not present

## 2015-11-04 DIAGNOSIS — I6523 Occlusion and stenosis of bilateral carotid arteries: Secondary | ICD-10-CM | POA: Diagnosis not present

## 2015-11-04 DIAGNOSIS — I6522 Occlusion and stenosis of left carotid artery: Secondary | ICD-10-CM | POA: Diagnosis not present

## 2015-11-05 DIAGNOSIS — N186 End stage renal disease: Secondary | ICD-10-CM | POA: Diagnosis not present

## 2015-11-05 DIAGNOSIS — E1129 Type 2 diabetes mellitus with other diabetic kidney complication: Secondary | ICD-10-CM | POA: Diagnosis not present

## 2015-11-05 DIAGNOSIS — D631 Anemia in chronic kidney disease: Secondary | ICD-10-CM | POA: Diagnosis not present

## 2015-11-07 ENCOUNTER — Ambulatory Visit (INDEPENDENT_AMBULATORY_CARE_PROVIDER_SITE_OTHER): Payer: Medicare Other | Admitting: Pharmacist

## 2015-11-07 DIAGNOSIS — E1129 Type 2 diabetes mellitus with other diabetic kidney complication: Secondary | ICD-10-CM | POA: Diagnosis not present

## 2015-11-07 DIAGNOSIS — N186 End stage renal disease: Secondary | ICD-10-CM | POA: Diagnosis not present

## 2015-11-07 DIAGNOSIS — Z7901 Long term (current) use of anticoagulants: Secondary | ICD-10-CM

## 2015-11-07 DIAGNOSIS — I480 Paroxysmal atrial fibrillation: Secondary | ICD-10-CM

## 2015-11-07 DIAGNOSIS — D631 Anemia in chronic kidney disease: Secondary | ICD-10-CM | POA: Diagnosis not present

## 2015-11-07 LAB — POCT INR: INR: 3.1

## 2015-11-07 NOTE — Telephone Encounter (Signed)
Patient scheduled 8/17 per chart

## 2015-11-10 DIAGNOSIS — E1129 Type 2 diabetes mellitus with other diabetic kidney complication: Secondary | ICD-10-CM | POA: Diagnosis not present

## 2015-11-10 DIAGNOSIS — D631 Anemia in chronic kidney disease: Secondary | ICD-10-CM | POA: Diagnosis not present

## 2015-11-10 DIAGNOSIS — N186 End stage renal disease: Secondary | ICD-10-CM | POA: Diagnosis not present

## 2015-11-11 DIAGNOSIS — H35373 Puckering of macula, bilateral: Secondary | ICD-10-CM | POA: Diagnosis not present

## 2015-11-11 DIAGNOSIS — H353133 Nonexudative age-related macular degeneration, bilateral, advanced atrophic without subfoveal involvement: Secondary | ICD-10-CM | POA: Diagnosis not present

## 2015-11-12 DIAGNOSIS — N186 End stage renal disease: Secondary | ICD-10-CM | POA: Diagnosis not present

## 2015-11-12 DIAGNOSIS — E1129 Type 2 diabetes mellitus with other diabetic kidney complication: Secondary | ICD-10-CM | POA: Diagnosis not present

## 2015-11-12 DIAGNOSIS — D631 Anemia in chronic kidney disease: Secondary | ICD-10-CM | POA: Diagnosis not present

## 2015-11-13 ENCOUNTER — Ambulatory Visit (HOSPITAL_COMMUNITY): Payer: Medicare Other | Admitting: Certified Registered"

## 2015-11-13 ENCOUNTER — Ambulatory Visit (INDEPENDENT_AMBULATORY_CARE_PROVIDER_SITE_OTHER): Payer: Medicare Other | Admitting: Pharmacist

## 2015-11-13 ENCOUNTER — Encounter (HOSPITAL_COMMUNITY): Payer: Self-pay | Admitting: *Deleted

## 2015-11-13 ENCOUNTER — Ambulatory Visit (HOSPITAL_COMMUNITY)
Admission: RE | Admit: 2015-11-13 | Discharge: 2015-11-13 | Disposition: A | Discharge status: Home / Self Care | Payer: Medicare Other | Source: Ambulatory Visit | Attending: Cardiovascular Disease | Admitting: Cardiovascular Disease

## 2015-11-13 ENCOUNTER — Encounter (HOSPITAL_COMMUNITY)
Admission: RE | Disposition: A | Discharge status: Home / Self Care | Payer: Self-pay | Source: Ambulatory Visit | Attending: Cardiovascular Disease

## 2015-11-13 DIAGNOSIS — E785 Hyperlipidemia, unspecified: Secondary | ICD-10-CM | POA: Diagnosis not present

## 2015-11-13 DIAGNOSIS — Z8673 Personal history of transient ischemic attack (TIA), and cerebral infarction without residual deficits: Secondary | ICD-10-CM | POA: Insufficient documentation

## 2015-11-13 DIAGNOSIS — I4819 Other persistent atrial fibrillation: Secondary | ICD-10-CM

## 2015-11-13 DIAGNOSIS — I959 Hypotension, unspecified: Secondary | ICD-10-CM | POA: Insufficient documentation

## 2015-11-13 DIAGNOSIS — I120 Hypertensive chronic kidney disease with stage 5 chronic kidney disease or end stage renal disease: Secondary | ICD-10-CM | POA: Insufficient documentation

## 2015-11-13 DIAGNOSIS — I480 Paroxysmal atrial fibrillation: Secondary | ICD-10-CM | POA: Diagnosis not present

## 2015-11-13 DIAGNOSIS — I350 Nonrheumatic aortic (valve) stenosis: Secondary | ICD-10-CM | POA: Insufficient documentation

## 2015-11-13 DIAGNOSIS — N186 End stage renal disease: Secondary | ICD-10-CM | POA: Diagnosis not present

## 2015-11-13 DIAGNOSIS — E1142 Type 2 diabetes mellitus with diabetic polyneuropathy: Secondary | ICD-10-CM | POA: Insufficient documentation

## 2015-11-13 DIAGNOSIS — Z992 Dependence on renal dialysis: Secondary | ICD-10-CM | POA: Diagnosis not present

## 2015-11-13 DIAGNOSIS — I481 Persistent atrial fibrillation: Secondary | ICD-10-CM | POA: Diagnosis not present

## 2015-11-13 DIAGNOSIS — E1122 Type 2 diabetes mellitus with diabetic chronic kidney disease: Secondary | ICD-10-CM | POA: Diagnosis not present

## 2015-11-13 DIAGNOSIS — Z7901 Long term (current) use of anticoagulants: Secondary | ICD-10-CM

## 2015-11-13 DIAGNOSIS — I4891 Unspecified atrial fibrillation: Secondary | ICD-10-CM | POA: Diagnosis not present

## 2015-11-13 HISTORY — PX: CARDIOVERSION: SHX1299

## 2015-11-13 LAB — POCT INR: INR: 3

## 2015-11-13 LAB — POCT I-STAT 4, (NA,K, GLUC, HGB,HCT)
GLUCOSE: 83 mg/dL (ref 65–99)
HCT: 30 % — ABNORMAL LOW (ref 39.0–52.0)
Hemoglobin: 10.2 g/dL — ABNORMAL LOW (ref 13.0–17.0)
Potassium: 3.8 mmol/L (ref 3.5–5.1)
SODIUM: 142 mmol/L (ref 135–145)

## 2015-11-13 SURGERY — CARDIOVERSION
Anesthesia: General

## 2015-11-13 MED ORDER — LIDOCAINE 2% (20 MG/ML) 5 ML SYRINGE
INTRAMUSCULAR | Status: AC
Start: 2015-11-13 — End: 2015-11-13
  Filled 2015-11-13: qty 5

## 2015-11-13 MED ORDER — SODIUM CHLORIDE 0.9 % IV SOLN
INTRAVENOUS | Status: DC
  Administered 2015-11-13: 500 mL via INTRAVENOUS

## 2015-11-13 MED ORDER — PROPOFOL 10 MG/ML IV BOLUS
INTRAVENOUS | Status: DC | PRN
  Administered 2015-11-13: 70 mg via INTRAVENOUS

## 2015-11-13 MED ORDER — LIDOCAINE 2% (20 MG/ML) 5 ML SYRINGE
INTRAMUSCULAR | Status: DC | PRN
  Administered 2015-11-13: 20 mg via INTRAVENOUS

## 2015-11-13 MED ORDER — PROPOFOL 10 MG/ML IV BOLUS
INTRAVENOUS | Status: AC
  Filled 2015-11-13: qty 20

## 2015-11-13 NOTE — CV Procedure (Signed)
  CARDIOVERSION NOTE   Procedure: Electrical Cardioversion Indications:  Atrial Fibrillation  Procedure Details:  Consent: Risks of procedure as well as the alternatives and risks of each were explained to the (patient/caregiver).  Consent for procedure obtained.  Time Out: Verified patient identification, verified procedure, site/side was marked, verified correct patient position, special equipment/implants available, medications/allergies/relevent history reviewed, required imaging and test results available.  Performed  Patient placed on cardiac monitor, pulse oximetry, supplemental oxygen as necessary.  Sedation given: propofol 70 mg and lidocaine 20 mg Pacer pads placed anterior and posterior chest.  Cardioverted 1 time(s).  Cardioverted at 120J.  Evaluation: Findings: Post procedure EKG shows: NSR Complications: None Patient did tolerate procedure well.  Troy Sine, MD, [facility] 11/13/2015 11:40 AM

## 2015-11-13 NOTE — Transfer of Care (Signed)
Immediate Anesthesia Transfer of Care Note  Patient: Alexander [name]  Procedure(s) Performed: Procedure(s): CARDIOVERSION (N/A)  Patient Location: Endoscopy Unit  Anesthesia Type:MAC  Level of Consciousness: awake, alert  and oriented  Airway & Oxygen Therapy: Patient Spontanous Breathing  Post-op Assessment: Report given to RN  Post vital signs: Reviewed and stable  Last Vitals:  Vitals:   11/13/15 1138 11/13/15 1139  BP:    Pulse: (!) 59 (!) 59  Resp: (!) 23 (!) 22    Last Pain: There were no vitals filed for this visit.       Complications: No apparent anesthesia complications
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
## 2015-11-13 NOTE — Discharge Instructions (Signed)
Electrical Cardioversion, Care After °Refer to this sheet in the next few weeks. These instructions provide you with information on caring for yourself after your procedure. Your health care provider may also give you more specific instructions. Your treatment has been planned according to current medical practices, but problems sometimes occur. Call your health care provider if you have any problems or questions after your procedure. °WHAT TO EXPECT AFTER THE PROCEDURE °After your procedure, it is typical to have the following sensations: °· Some redness on the skin where the shocks were delivered. If this is tender, a sunburn lotion or hydrocortisone cream may help. °· Possible return of an abnormal heart rhythm within hours or days after the procedure. °HOME CARE INSTRUCTIONS °· Take medicines only as directed by your health care provider. Be sure you understand how and when to take your medicine. °· Learn how to feel your pulse and check it often. °· Limit your activity for 48 hours after the procedure or as directed by your health care provider. °· Avoid or minimize caffeine and other stimulants as directed by your health care provider. °SEEK MEDICAL CARE IF: °· You feel like your heart is beating too fast or your pulse is not regular. °· You have any questions about your medicines. °· You have bleeding that will not stop. °SEEK IMMEDIATE MEDICAL CARE IF: °· You are dizzy or feel faint. °· It is hard to breathe or you feel short of breath. °· There is a change in discomfort in your chest. °· Your speech is slurred or you have trouble moving an arm or leg on one side of your body. °· You get a serious muscle cramp that does not go away. °· Your fingers or toes turn cold or blue. °  °This information is not intended to replace advice given to you by your health care provider. Make sure you discuss any questions you have with your health care provider. °  °Document Released: 01/03/2013 Document Revised: 04/05/2014  Document Reviewed: 01/03/2013 °Elsevier Interactive Patient Education ©2016 [employer] ° °

## 2015-11-13 NOTE — Anesthesia Preprocedure Evaluation (Addendum)
Anesthesia Evaluation  Patient identified by MRN, date of birth, ID band Patient awake    Reviewed: Allergy & Precautions, NPO status , Patient's Chart, lab work & pertinent test results  Airway Mallampati: II  TM Distance: >3 FB Neck ROM: Full    Dental  (+) Teeth Intact, Chipped,    Pulmonary former smoker,    breath sounds clear to auscultation       Cardiovascular hypertension, + Peripheral Vascular Disease  + pacemaker + Cardiac Defibrillator  Rhythm:Irregular Rate:Normal     Neuro/Psych    GI/Hepatic   Endo/Other  diabetes  Renal/GU Dialysis and ESRFRenal disease     Musculoskeletal   Abdominal   Peds  Hematology   Anesthesia Other Findings   Reproductive/Obstetrics                           Anesthesia Physical Anesthesia Plan  ASA: III  Anesthesia Plan: General   Post-op Pain Management:    Induction: Intravenous  Airway Management Planned: Mask  Additional Equipment:   Intra-op Plan:   Post-operative Plan:   Informed Consent: I have reviewed the patients History and Physical, chart, labs and discussed the procedure including the risks, benefits and alternatives for the proposed anesthesia with the patient or authorized representative who has indicated his/her understanding and acceptance.     Plan Discussed with: CRNA and Anesthesiologist  Anesthesia Plan Comments:         Anesthesia Quick Evaluation

## 2015-11-13 NOTE — Anesthesia Postprocedure Evaluation (Signed)
Anesthesia Post Note  Patient: Alexander [name]  Procedure(s) Performed: Procedure(s) (LRB): CARDIOVERSION (N/A)  Patient location during evaluation: PACU Anesthesia Type: General Level of consciousness: awake, awake and alert and oriented Pain management: pain level controlled Vital Signs Assessment: post-procedure vital signs reviewed and stable Respiratory status: spontaneous breathing, nonlabored ventilation and respiratory function stable Anesthetic complications: no    Last Vitals:  Vitals:   11/13/15 1147 11/13/15 1157  BP: (!) 111/51 (!) 111/54  Pulse: (!) 106 64  Resp: (!) 21 (!) 22    Last Pain: There were no vitals filed for this visit.               Tammara Massing COKER

## 2015-11-13 NOTE — Interval H&P Note (Signed)
History and Physical Interval Note:  11/13/2015 11:21 AM  Alexander [name]  has presented today for surgery, with the diagnosis of AFIB  The various methods of treatment have been discussed with the patient and family. After consideration of risks, benefits and other options for treatment, the patient has consented to  Procedure(s): CARDIOVERSION (N/A) as a surgical intervention .  The patient's history has been reviewed, patient examined, no change in status, stable for surgery.  I have reviewed the patient's chart and labs.  Questions were answered to the patient's satisfaction.     Shelva Majestic

## 2015-11-14 ENCOUNTER — Encounter (HOSPITAL_COMMUNITY): Payer: Self-pay | Admitting: Cardiovascular Disease

## 2015-11-14 DIAGNOSIS — N186 End stage renal disease: Secondary | ICD-10-CM | POA: Diagnosis not present

## 2015-11-14 DIAGNOSIS — D631 Anemia in chronic kidney disease: Secondary | ICD-10-CM | POA: Diagnosis not present

## 2015-11-14 DIAGNOSIS — E1129 Type 2 diabetes mellitus with other diabetic kidney complication: Secondary | ICD-10-CM | POA: Diagnosis not present

## 2015-11-17 DIAGNOSIS — D631 Anemia in chronic kidney disease: Secondary | ICD-10-CM | POA: Diagnosis not present

## 2015-11-17 DIAGNOSIS — E1129 Type 2 diabetes mellitus with other diabetic kidney complication: Secondary | ICD-10-CM | POA: Diagnosis not present

## 2015-11-17 DIAGNOSIS — N186 End stage renal disease: Secondary | ICD-10-CM | POA: Diagnosis not present

## 2015-11-18 DIAGNOSIS — I10 Essential (primary) hypertension: Secondary | ICD-10-CM | POA: Diagnosis not present

## 2015-11-18 DIAGNOSIS — M179 Osteoarthritis of knee, unspecified: Secondary | ICD-10-CM | POA: Diagnosis not present

## 2015-11-18 DIAGNOSIS — M1990 Unspecified osteoarthritis, unspecified site: Secondary | ICD-10-CM | POA: Diagnosis not present

## 2015-11-19 DIAGNOSIS — M1990 Unspecified osteoarthritis, unspecified site: Secondary | ICD-10-CM | POA: Diagnosis not present

## 2015-11-19 DIAGNOSIS — M25461 Effusion, right knee: Secondary | ICD-10-CM | POA: Diagnosis not present

## 2015-11-19 DIAGNOSIS — I482 Chronic atrial fibrillation: Secondary | ICD-10-CM | POA: Diagnosis not present

## 2015-11-19 DIAGNOSIS — D631 Anemia in chronic kidney disease: Secondary | ICD-10-CM | POA: Diagnosis not present

## 2015-11-19 DIAGNOSIS — E1129 Type 2 diabetes mellitus with other diabetic kidney complication: Secondary | ICD-10-CM | POA: Diagnosis not present

## 2015-11-19 DIAGNOSIS — N186 End stage renal disease: Secondary | ICD-10-CM | POA: Diagnosis not present

## 2015-11-21 DIAGNOSIS — N186 End stage renal disease: Secondary | ICD-10-CM | POA: Diagnosis not present

## 2015-11-21 DIAGNOSIS — D631 Anemia in chronic kidney disease: Secondary | ICD-10-CM | POA: Diagnosis not present

## 2015-11-21 DIAGNOSIS — E1129 Type 2 diabetes mellitus with other diabetic kidney complication: Secondary | ICD-10-CM | POA: Diagnosis not present

## 2015-11-24 ENCOUNTER — Encounter: Payer: Self-pay | Admitting: Cardiology

## 2015-11-24 DIAGNOSIS — D631 Anemia in chronic kidney disease: Secondary | ICD-10-CM | POA: Diagnosis not present

## 2015-11-24 DIAGNOSIS — E1129 Type 2 diabetes mellitus with other diabetic kidney complication: Secondary | ICD-10-CM | POA: Diagnosis not present

## 2015-11-24 DIAGNOSIS — N186 End stage renal disease: Secondary | ICD-10-CM | POA: Diagnosis not present

## 2015-11-26 DIAGNOSIS — N186 End stage renal disease: Secondary | ICD-10-CM | POA: Diagnosis not present

## 2015-11-26 DIAGNOSIS — M25579 Pain in unspecified ankle and joints of unspecified foot: Secondary | ICD-10-CM | POA: Diagnosis not present

## 2015-11-26 DIAGNOSIS — D631 Anemia in chronic kidney disease: Secondary | ICD-10-CM | POA: Diagnosis not present

## 2015-11-26 DIAGNOSIS — M67919 Unspecified disorder of synovium and tendon, unspecified shoulder: Secondary | ICD-10-CM | POA: Diagnosis not present

## 2015-11-26 DIAGNOSIS — E1129 Type 2 diabetes mellitus with other diabetic kidney complication: Secondary | ICD-10-CM | POA: Diagnosis not present

## 2015-11-26 DIAGNOSIS — M1189 Other specified crystal arthropathies, multiple sites: Secondary | ICD-10-CM | POA: Diagnosis not present

## 2015-11-26 DIAGNOSIS — M25519 Pain in unspecified shoulder: Secondary | ICD-10-CM | POA: Diagnosis not present

## 2015-11-27 ENCOUNTER — Other Ambulatory Visit: Payer: Self-pay

## 2015-11-27 ENCOUNTER — Ambulatory Visit (INDEPENDENT_AMBULATORY_CARE_PROVIDER_SITE_OTHER): Payer: Medicare Other | Admitting: Pharmacist Clinician (PhC)/ Clinical Pharmacy Specialist

## 2015-11-27 DIAGNOSIS — I480 Paroxysmal atrial fibrillation: Secondary | ICD-10-CM

## 2015-11-27 DIAGNOSIS — N186 End stage renal disease: Secondary | ICD-10-CM | POA: Diagnosis not present

## 2015-11-27 DIAGNOSIS — Z7901 Long term (current) use of anticoagulants: Secondary | ICD-10-CM

## 2015-11-27 DIAGNOSIS — E1129 Type 2 diabetes mellitus with other diabetic kidney complication: Secondary | ICD-10-CM | POA: Diagnosis not present

## 2015-11-27 DIAGNOSIS — Z992 Dependence on renal dialysis: Secondary | ICD-10-CM | POA: Diagnosis not present

## 2015-11-27 LAB — POCT INR: INR: 2.9

## 2015-11-28 DIAGNOSIS — D631 Anemia in chronic kidney disease: Secondary | ICD-10-CM | POA: Diagnosis not present

## 2015-11-28 DIAGNOSIS — E1129 Type 2 diabetes mellitus with other diabetic kidney complication: Secondary | ICD-10-CM | POA: Diagnosis not present

## 2015-11-28 DIAGNOSIS — N186 End stage renal disease: Secondary | ICD-10-CM | POA: Diagnosis not present

## 2015-12-01 DIAGNOSIS — N186 End stage renal disease: Secondary | ICD-10-CM | POA: Diagnosis not present

## 2015-12-01 DIAGNOSIS — E1129 Type 2 diabetes mellitus with other diabetic kidney complication: Secondary | ICD-10-CM | POA: Diagnosis not present

## 2015-12-01 DIAGNOSIS — D631 Anemia in chronic kidney disease: Secondary | ICD-10-CM | POA: Diagnosis not present

## 2015-12-03 ENCOUNTER — Telehealth: Payer: Self-pay | Admitting: Cardiology

## 2015-12-03 DIAGNOSIS — N186 End stage renal disease: Secondary | ICD-10-CM | POA: Diagnosis not present

## 2015-12-03 DIAGNOSIS — E1129 Type 2 diabetes mellitus with other diabetic kidney complication: Secondary | ICD-10-CM | POA: Diagnosis not present

## 2015-12-03 DIAGNOSIS — D631 Anemia in chronic kidney disease: Secondary | ICD-10-CM | POA: Diagnosis not present

## 2015-12-03 NOTE — Telephone Encounter (Signed)
Patient and wife are both on phone. States patient is currently in AFib, but asymptomatic currently. Advised to continue Amiodarone. Will discuss next step in plan of care at next week's scheduled OV. Advised to call back if becomes symptomatic. They are both agreeable to this plan.

## 2015-12-03 NOTE — Telephone Encounter (Signed)
New Message  Pt wife call requesting to speak with RN. Pt wife states pt Afib came back after corrective procedure. Pt wife wanted to communicate information to RN and Doctor.   Pt wife also ask when will pt be able to come of medication Amiodarone 200mg . Please call back to discuss

## 2015-12-05 ENCOUNTER — Ambulatory Visit (INDEPENDENT_AMBULATORY_CARE_PROVIDER_SITE_OTHER): Payer: Medicare Other | Admitting: Pharmacist Clinician (PhC)/ Clinical Pharmacy Specialist

## 2015-12-05 DIAGNOSIS — Z7901 Long term (current) use of anticoagulants: Secondary | ICD-10-CM

## 2015-12-05 DIAGNOSIS — N186 End stage renal disease: Secondary | ICD-10-CM | POA: Diagnosis not present

## 2015-12-05 DIAGNOSIS — E1129 Type 2 diabetes mellitus with other diabetic kidney complication: Secondary | ICD-10-CM | POA: Diagnosis not present

## 2015-12-05 DIAGNOSIS — I480 Paroxysmal atrial fibrillation: Secondary | ICD-10-CM

## 2015-12-05 DIAGNOSIS — D631 Anemia in chronic kidney disease: Secondary | ICD-10-CM | POA: Diagnosis not present

## 2015-12-05 LAB — POCT INR: INR: 4.3

## 2015-12-08 DIAGNOSIS — N186 End stage renal disease: Secondary | ICD-10-CM | POA: Diagnosis not present

## 2015-12-08 DIAGNOSIS — E1129 Type 2 diabetes mellitus with other diabetic kidney complication: Secondary | ICD-10-CM | POA: Diagnosis not present

## 2015-12-08 DIAGNOSIS — D631 Anemia in chronic kidney disease: Secondary | ICD-10-CM | POA: Diagnosis not present

## 2015-12-08 NOTE — Progress Notes (Signed)
Electrophysiology Office Note   Date:  12/09/2015   ID:  LIEM MURAOKA, DOB [birth year], MRN [id number]  PCP:  Horatio Pel, MD  Cardiologist:  Skeet Latch Primary Electrophysiologist:  Will Meredith [name], MD    Chief Complaint  Patient presents with  . Pacemaker Check    persistent Afib     History of Present Illness: Alexander [name] is a 75 y.o. male who presents today for electrophysiology evaluation.   He has a history of moderate aortic stenosis, ESRD on HD, diabetes mellitus, hyperlipidemia, carotid artery disease with complete occlusion of the L carotid, prior TIA and atrial fibrillation.  He had a dual chamber pacemaker placed on 03/11/15. At that time, he was feeling weak and dizzy with presyncopal symptoms.   Today, he denies symptoms of palpitations, chest pain, shortness of breath, orthopnea, PND, lower extremity edema, claudication bleeding, or neurologic sequela. He has been feeling significant fatigue and shortness of breath. Had cardioversion for AF which was unsuccessful. Since then, he has noted fatigue and shortness of breath.  Past Medical History:  Diagnosis Date  . Anemia   . Aortic stenosis 06/15/12   TEE - EF 0000000; grade 1 diastolic dysfunction; mild/mod aortic valve stenosis; Mitral valve had calcified annulus, mild pulm htn PA peak pressure 35mmHg  . Barrett's esophagus 05/2003  . Bradycardia   . Carpal tunnel syndrome, bilateral 11/03/2015  . Colon polyps   . CVA (cerebral infarction)    2004/affected left side  . Depression   . Diabetes mellitus without complication ([facility])   . Diabetic peripheral neuropathy ([facility]) 10/02/2015  . Diverticulosis   . End stage renal disease ([place] [facility])    hemodialysis 3 times a week  . GERD (gastroesophageal reflux disease)   . Hyperlipidemia   . Hypertension   . Kidney stones   . Macular degeneration    both eyes  . Orthostatic hypotension 09/09/2015  . Paroxysmal atrial fibrillation ([facility])   . Peptic ulcer      bleeding, 1969  . S/P epidural steroid injection    last  injection over 10 years ago  . Seasonal allergies   . Tubular adenoma of colon 07/2001   Past Surgical History:  Procedure Laterality Date  . AV FISTULA PLACEMENT  2009  . BACK SURGERY    . CARDIOVERSION N/A 11/13/2015   Procedure: CARDIOVERSION;  Surgeon: Troy Sine, MD;  Location: [facility];  Service: Cardiovascular;  Laterality: N/A;  . CORNEAL TRANSPLANT  1999   right eye  . CYSTOSCOPY  several times   kidney stones  . EP IMPLANTABLE DEVICE N/A 03/11/2015   Procedure: Pacemaker Implant;  Surgeon: Will Meredith [name], MD;  Location: [facility];  Service: Cardiovascular;  Laterality: N/A;  . LAMINECTOMY  1969  . TONSILLECTOMY  1964     Current Outpatient Prescriptions  Medication Sig Dispense Refill  . acetaminophen (TYLENOL) 500 MG tablet Take 1,000 mg by mouth every 6 (six) hours as needed for pain.    [name] amiodarone (PACERONE) 200 MG tablet Take 1 tablet (200 mg total) by mouth daily. 45 tablet 0  . atorvastatin (LIPITOR) 40 MG tablet Take 20 mg by mouth daily.    [name] b complex-vitamin c-folic acid (NEPHRO-VITE) 0.8 MG TABS Take 1 tablet by mouth daily.     . bromocriptine (PARLODEL) 5 MG capsule Take 5 mg by mouth at bedtime.     . calcium acetate (PHOSLO) 667 MG capsule Take 667 mg by mouth 4 (four) times  daily.     . cetirizine (ZYRTEC) 10 MG tablet Take 10 mg by mouth at bedtime.     . cinacalcet (SENSIPAR) 30 MG tablet Take 30 mg by mouth daily.     . dorzolamide-timolol (COSOPT) 22.3-6.8 MG/ML ophthalmic solution Place 1 drop into the right eye 2 (two) times daily.    [name] FLUoxetine (PROZAC) 20 MG capsule Take 40 mg by mouth daily.     . midodrine (PROAMATINE) 5 MG tablet Take 3 tablets (15 mg total) by mouth 3 (three) times daily as needed (for systolic blood pressure less than 130). (Patient taking differently: Take 5 mg by mouth 3 (three) times daily as needed (for systolic blood pressure less than  130). ) 270 tablet 3  . omeprazole (PRILOSEC) 20 MG capsule Take 20 mg by mouth daily.    [name] warfarin (COUMADIN) 5 MG tablet TAKE BY MOUTH DAILY AS DIRECTED BY COUMADIN CLINIC (Patient taking differently: Take 5-7.5 mg by mouth daily at 6 PM. Takes 7.5mg -MON, WED, FRI, SAT Takes 5mg -TUES< THURS, SUN) 90 tablet 3   No current facility-administered medications for this visit.     Allergies:   Codeine; Penicillins; and Tramadol   Social History:  The patient  reports that he quit smoking about 17 years ago. He has never used smokeless tobacco. He reports that he does not drink alcohol or use drugs.   Family History:  The patient's family history includes Cancer in his brother; Heart disease in his sister; Hypertension in his father; Stomach cancer in his mother; Stroke in his sister.    ROS:  Please see the history of present illness.   Otherwise, review of systems is positive for none.   All other systems are reviewed and negative.    PHYSICAL EXAM: VS:  BP 140/88   Pulse 97   Ht 5' 10.5" (1.791 m)   Wt 209 lb (94.8 kg)   BMI 29.56 kg/m  , BMI Body mass index is 29.56 kg/m. GEN: Well nourished, well developed, in no acute distress  HEENT: normal  Neck: no JVD, carotid bruits, or masses Cardiac: tachycardic, irregular; no murmurs, rubs, or gallops,no edema  Respiratory:  clear to auscultation bilaterally, normal work of breathing GI: soft, nontender, nondistended, + BS MS: no deformity or atrophy  Skin: warm and dry, fistula left forearm, device pocket C/D/I Neuro:  Strength and sensation are intact Psych: euthymic mood, full affect  EKG:  EKG is ordered today. Personal review of the ECG shows atrial fibrillation, rate 97  Device interrogation performed today, results in [facility].   Recent Labs: 12/18/2014: TSH 1.368 10/04/2015: ALT 20; BUN 37; Creatinine, Ser 6.85; Platelets 209 11/13/2015: Hemoglobin 10.2; Potassium 3.8; Sodium 142    Lipid Panel     Component Value  Date/Time   CHOL 138 10/28/2015 1008   TRIG 158 (H) 10/28/2015 1008   HDL 30 (L) 10/28/2015 1008   CHOLHDL 4.6 10/28/2015 1008   VLDL 32 (H) 10/28/2015 1008   LDLCALC 76 10/28/2015 1008     Wt Readings from Last 3 Encounters:  12/09/15 209 lb (94.8 kg)  11/13/15 202 lb (91.6 kg)  10/17/15 208 lb 3.2 oz (94.4 kg)      11/28/14 TTE: Study Conclusions  - Left ventricle: The cavity size was normal. Systolic function was normal. The estimated ejection fraction was in the range of 60% to 65%. Wall motion was normal; there were no regional wall motion abnormalities. Features are consistent with a pseudonormal left ventricular filling  pattern, with concomitant abnormal relaxation and increased filling pressure (grade 2 diastolic dysfunction). Doppler parameters are consistent with high ventricular filling pressure. - Aortic valve: Severe diffuse thickening and calcification. There was moderate stenosis. VTI ratio of LVOT to aortic valve: 0.31. Valve area (VTI): 1.27 cm^2. Indexed valve area (VTI): 0.59 cm^2/m^2. Mean velocity ratio of LVOT to aortic valve: 0.31. Valve area (Vmean): 1.29 cm^2. Indexed valve area (Vmean): 0.6 cm^2/m^2.  Mean gradient (S): 22 mm Hg. - Mitral valve: Severely calcified annulus. There was mild regurgitation. - Left atrium: The atrium was mildly dilated. - Right ventricle: The cavity size was mildly dilated. Wall thickness was normal. - Tricuspid valve: There was trivial regurgitation. - Pulmonic valve: There was trivial regurgitation. - Pulmonary arteries: PA peak pressure: 37 mm Hg (S).   ASSESSMENT AND PLAN:  1.  Atrial fibrillation: on warfarin.  Has CHADS2VASc of 4.  Currently taking warfarin. Dual-chamber pacemaker placed in December for symptomatically bradycardia and syncope.  He has been in atrial fibrillation since April. Was started in amiodarone with cardioversion which was unsuccessful. We will attempt a second  cardioversion with increased energy.  2. Presyncope: Dual-chamber pacemaker placed in December. Functioning appropriately.   Current medicines are reviewed at length with the patient today.   The patient has concerns regarding his medicines.  The following changes were made today:  none  Labs/ tests ordered today include:   No orders of the defined types were placed in this encounter.    Disposition:   FU with Will Meredith [name] 6  months  Signed, Will Meredith [name], MD  12/09/2015 12:02 PM     [facility] [address] [place] [address] [phone] (office) [phone] (fax)

## 2015-12-09 ENCOUNTER — Encounter: Payer: Self-pay | Admitting: *Deleted

## 2015-12-09 ENCOUNTER — Ambulatory Visit (INDEPENDENT_AMBULATORY_CARE_PROVIDER_SITE_OTHER): Payer: Medicare Other | Admitting: Cardiology

## 2015-12-09 ENCOUNTER — Encounter: Payer: Self-pay | Admitting: Cardiology

## 2015-12-09 VITALS — BP 140/88 | HR 97 | Ht 70.5 in | Wt 209.0 lb

## 2015-12-09 DIAGNOSIS — Z45018 Encounter for adjustment and management of other part of cardiac pacemaker: Secondary | ICD-10-CM

## 2015-12-09 DIAGNOSIS — I481 Persistent atrial fibrillation: Secondary | ICD-10-CM | POA: Diagnosis not present

## 2015-12-09 DIAGNOSIS — I4819 Other persistent atrial fibrillation: Secondary | ICD-10-CM

## 2015-12-09 DIAGNOSIS — I10 Essential (primary) hypertension: Secondary | ICD-10-CM | POA: Diagnosis not present

## 2015-12-09 DIAGNOSIS — D352 Benign neoplasm of pituitary gland: Secondary | ICD-10-CM | POA: Diagnosis not present

## 2015-12-09 DIAGNOSIS — E119 Type 2 diabetes mellitus without complications: Secondary | ICD-10-CM | POA: Diagnosis not present

## 2015-12-09 DIAGNOSIS — I495 Sick sinus syndrome: Secondary | ICD-10-CM

## 2015-12-09 DIAGNOSIS — Z01812 Encounter for preprocedural laboratory examination: Secondary | ICD-10-CM

## 2015-12-09 LAB — CBC WITH DIFFERENTIAL/PLATELET
BASOS PCT: 0 %
Basophils Absolute: 0 cells/uL (ref 0–200)
Eosinophils Absolute: 85 cells/uL (ref 15–500)
Eosinophils Relative: 1 %
HEMATOCRIT: 35.3 % — AB (ref 38.5–50.0)
HEMOGLOBIN: 11.5 g/dL — AB (ref 13.2–17.1)
LYMPHS ABS: 1360 {cells}/uL (ref 850–3900)
Lymphocytes Relative: 16 %
MCH: 33.2 pg — ABNORMAL HIGH (ref 27.0–33.0)
MCHC: 32.6 g/dL (ref 32.0–36.0)
MCV: 102 fL — ABNORMAL HIGH (ref 80.0–100.0)
MONO ABS: 595 {cells}/uL (ref 200–950)
MPV: 10.9 fL (ref 7.5–12.5)
Monocytes Relative: 7 %
Neutro Abs: 6460 cells/uL (ref 1500–7800)
Neutrophils Relative %: 76 %
Platelets: 192 10*3/uL (ref 140–400)
RBC: 3.46 MIL/uL — AB (ref 4.20–5.80)
RDW: 18 % — ABNORMAL HIGH (ref 11.0–15.0)
WBC: 8.5 10*3/uL (ref 3.8–10.8)

## 2015-12-09 LAB — BASIC METABOLIC PANEL
BUN: 40 mg/dL — ABNORMAL HIGH (ref 7–25)
CALCIUM: 7.8 mg/dL — AB (ref 8.6–10.3)
CHLORIDE: 99 mmol/L (ref 98–110)
CO2: 27 mmol/L (ref 20–31)
Creat: 5.67 mg/dL — ABNORMAL HIGH (ref 0.70–1.18)
Glucose, Bld: 65 mg/dL (ref 65–99)
Potassium: 4.3 mmol/L (ref 3.5–5.3)
SODIUM: 139 mmol/L (ref 135–146)

## 2015-12-09 LAB — PROTIME-INR
INR: 1.7 — AB
Prothrombin Time: 17.7 s — ABNORMAL HIGH (ref 9.0–11.5)

## 2015-12-09 NOTE — Patient Instructions (Addendum)
Medication Instructions:  Your physician recommends that you continue on your current medications as directed. Please refer to the Current Medication list given to you today.  Labwork: Pre procedure labs today: BMET, CBC w/ diff and INR  Testing/Procedures: Your physician has recommended that you have a Cardioversion (DCCV). Electrical Cardioversion uses a jolt of electricity to your heart either through paddles or wired patches attached to your chest. This is a controlled, usually prescheduled, procedure. Defibrillation is done under light anesthesia in the hospital, and you usually go home the day of the procedure. This is done to get your heart back into a normal rhythm. You are not awake for the procedure.  You are scheduled for 12/11/15 with Dr. Curt Bears  Please arrive at [facility] at 12:00 pm.  You  may have clear liquid breakfast, then nothing to eat or drink after 6:00      am.  Clear liquids include: water, broth, Sprite, Ginger Ale, black coffee,          tea (no sugar), cranberry / grape / apple juice, jello (not red),  popsicle         from clear juices (not red).  You will have your pre procedure lab work today.  Follow-Up: Remote monitoring is used to monitor your Pacemaker of ICD from home. This monitoring reduces the number of office visits required to check your device to one time per year. It allows [place] to keep an eye on the functioning of your device to ensure it is working properly. You are scheduled for a device check from home on 03/09/2016. You may send your transmission at any time that day. If you have a wireless device, the transmission will be sent automatically. After your physician reviews your transmission, you will receive a postcard with your next transmission date.  Your physician wants you to follow-up in: 6 months with Dr. Curt Bears. You will receive a reminder letter in the mail two months in advance. If you don't receive a letter, please call our office to schedule  the follow-up appointment.  If you need a refill on your cardiac medications before your next appointment, please call your pharmacy.  Thank you for choosing [facility]!!   Trinidad Curet, RN [phone]

## 2015-12-10 DIAGNOSIS — D631 Anemia in chronic kidney disease: Secondary | ICD-10-CM | POA: Diagnosis not present

## 2015-12-10 DIAGNOSIS — N186 End stage renal disease: Secondary | ICD-10-CM | POA: Diagnosis not present

## 2015-12-10 DIAGNOSIS — E1129 Type 2 diabetes mellitus with other diabetic kidney complication: Secondary | ICD-10-CM | POA: Diagnosis not present

## 2015-12-11 ENCOUNTER — Ambulatory Visit (HOSPITAL_COMMUNITY): Admission: RE | Admit: 2015-12-11 | Payer: Medicare Other | Source: Ambulatory Visit | Admitting: Cardiology

## 2015-12-11 ENCOUNTER — Encounter (HOSPITAL_COMMUNITY): Admission: RE | Payer: Self-pay | Source: Ambulatory Visit

## 2015-12-11 SURGERY — CARDIOVERSION
Anesthesia: Monitor Anesthesia Care

## 2015-12-12 ENCOUNTER — Ambulatory Visit (INDEPENDENT_AMBULATORY_CARE_PROVIDER_SITE_OTHER): Payer: Medicare Other | Admitting: Pharmacist Clinician (PhC)/ Clinical Pharmacy Specialist

## 2015-12-12 DIAGNOSIS — E1129 Type 2 diabetes mellitus with other diabetic kidney complication: Secondary | ICD-10-CM | POA: Diagnosis not present

## 2015-12-12 DIAGNOSIS — D631 Anemia in chronic kidney disease: Secondary | ICD-10-CM | POA: Diagnosis not present

## 2015-12-12 DIAGNOSIS — Z7901 Long term (current) use of anticoagulants: Secondary | ICD-10-CM

## 2015-12-12 DIAGNOSIS — N186 End stage renal disease: Secondary | ICD-10-CM | POA: Diagnosis not present

## 2015-12-12 DIAGNOSIS — I480 Paroxysmal atrial fibrillation: Secondary | ICD-10-CM | POA: Diagnosis not present

## 2015-12-12 LAB — POCT INR: INR: 3.4

## 2015-12-12 NOTE — Addendum Note (Signed)
Addended by: Stanton Kidney on: 12/12/2015 01:20 PM   Modules accepted: Orders

## 2015-12-15 DIAGNOSIS — D631 Anemia in chronic kidney disease: Secondary | ICD-10-CM | POA: Diagnosis not present

## 2015-12-15 DIAGNOSIS — E1129 Type 2 diabetes mellitus with other diabetic kidney complication: Secondary | ICD-10-CM | POA: Diagnosis not present

## 2015-12-15 DIAGNOSIS — N186 End stage renal disease: Secondary | ICD-10-CM | POA: Diagnosis not present

## 2015-12-16 DIAGNOSIS — E221 Hyperprolactinemia: Secondary | ICD-10-CM | POA: Diagnosis not present

## 2015-12-16 DIAGNOSIS — E789 Disorder of lipoprotein metabolism, unspecified: Secondary | ICD-10-CM | POA: Diagnosis not present

## 2015-12-16 DIAGNOSIS — I4891 Unspecified atrial fibrillation: Secondary | ICD-10-CM | POA: Diagnosis not present

## 2015-12-17 DIAGNOSIS — N186 End stage renal disease: Secondary | ICD-10-CM | POA: Diagnosis not present

## 2015-12-17 DIAGNOSIS — E1129 Type 2 diabetes mellitus with other diabetic kidney complication: Secondary | ICD-10-CM | POA: Diagnosis not present

## 2015-12-17 DIAGNOSIS — D631 Anemia in chronic kidney disease: Secondary | ICD-10-CM | POA: Diagnosis not present

## 2015-12-17 DIAGNOSIS — M1189 Other specified crystal arthropathies, multiple sites: Secondary | ICD-10-CM | POA: Diagnosis not present

## 2015-12-17 DIAGNOSIS — M67919 Unspecified disorder of synovium and tendon, unspecified shoulder: Secondary | ICD-10-CM | POA: Diagnosis not present

## 2015-12-19 ENCOUNTER — Ambulatory Visit (INDEPENDENT_AMBULATORY_CARE_PROVIDER_SITE_OTHER): Payer: Medicare Other | Admitting: Pharmacist

## 2015-12-19 DIAGNOSIS — E1129 Type 2 diabetes mellitus with other diabetic kidney complication: Secondary | ICD-10-CM | POA: Diagnosis not present

## 2015-12-19 DIAGNOSIS — D631 Anemia in chronic kidney disease: Secondary | ICD-10-CM | POA: Diagnosis not present

## 2015-12-19 DIAGNOSIS — Z7901 Long term (current) use of anticoagulants: Secondary | ICD-10-CM

## 2015-12-19 DIAGNOSIS — I480 Paroxysmal atrial fibrillation: Secondary | ICD-10-CM | POA: Diagnosis not present

## 2015-12-19 DIAGNOSIS — N186 End stage renal disease: Secondary | ICD-10-CM | POA: Diagnosis not present

## 2015-12-19 LAB — POCT INR: INR: 4.4

## 2015-12-22 DIAGNOSIS — N186 End stage renal disease: Secondary | ICD-10-CM | POA: Diagnosis not present

## 2015-12-22 DIAGNOSIS — E1129 Type 2 diabetes mellitus with other diabetic kidney complication: Secondary | ICD-10-CM | POA: Diagnosis not present

## 2015-12-22 DIAGNOSIS — D631 Anemia in chronic kidney disease: Secondary | ICD-10-CM | POA: Diagnosis not present

## 2015-12-23 ENCOUNTER — Other Ambulatory Visit: Payer: Self-pay | Admitting: Cardiology

## 2015-12-24 DIAGNOSIS — N186 End stage renal disease: Secondary | ICD-10-CM | POA: Diagnosis not present

## 2015-12-24 DIAGNOSIS — I482 Chronic atrial fibrillation: Secondary | ICD-10-CM | POA: Diagnosis not present

## 2015-12-24 DIAGNOSIS — E1129 Type 2 diabetes mellitus with other diabetic kidney complication: Secondary | ICD-10-CM | POA: Diagnosis not present

## 2015-12-24 DIAGNOSIS — D631 Anemia in chronic kidney disease: Secondary | ICD-10-CM | POA: Diagnosis not present

## 2015-12-26 ENCOUNTER — Ambulatory Visit (INDEPENDENT_AMBULATORY_CARE_PROVIDER_SITE_OTHER): Payer: Medicare Other | Admitting: Pharmacist Clinician (PhC)/ Clinical Pharmacy Specialist

## 2015-12-26 DIAGNOSIS — D631 Anemia in chronic kidney disease: Secondary | ICD-10-CM | POA: Diagnosis not present

## 2015-12-26 DIAGNOSIS — E1129 Type 2 diabetes mellitus with other diabetic kidney complication: Secondary | ICD-10-CM | POA: Diagnosis not present

## 2015-12-26 DIAGNOSIS — Z7901 Long term (current) use of anticoagulants: Secondary | ICD-10-CM | POA: Diagnosis not present

## 2015-12-26 DIAGNOSIS — N186 End stage renal disease: Secondary | ICD-10-CM | POA: Diagnosis not present

## 2015-12-26 DIAGNOSIS — I480 Paroxysmal atrial fibrillation: Secondary | ICD-10-CM

## 2015-12-26 LAB — POCT INR: INR: 2.9

## 2015-12-27 DIAGNOSIS — E1129 Type 2 diabetes mellitus with other diabetic kidney complication: Secondary | ICD-10-CM | POA: Diagnosis not present

## 2015-12-27 DIAGNOSIS — Z992 Dependence on renal dialysis: Secondary | ICD-10-CM | POA: Diagnosis not present

## 2015-12-27 DIAGNOSIS — N186 End stage renal disease: Secondary | ICD-10-CM | POA: Diagnosis not present

## 2015-12-29 ENCOUNTER — Telehealth: Payer: Self-pay | Admitting: Cardiology

## 2015-12-29 DIAGNOSIS — N186 End stage renal disease: Secondary | ICD-10-CM | POA: Diagnosis not present

## 2015-12-29 DIAGNOSIS — Z23 Encounter for immunization: Secondary | ICD-10-CM | POA: Diagnosis not present

## 2015-12-29 DIAGNOSIS — D631 Anemia in chronic kidney disease: Secondary | ICD-10-CM | POA: Diagnosis not present

## 2015-12-29 NOTE — Telephone Encounter (Signed)
New Message:    She says pt is supposed to be having a procedure,they are waiting to hear something Please call,they would like to know what is going on.

## 2015-12-29 NOTE — Telephone Encounter (Addendum)
Left detailed message informing wife that I would be in touch with her/pt this week to discuss/arrange next step in plan of care.    Rockne Menghini, RPH-CPP  Alexander Kidney, RN        Alexander [name] has 4 weeks with INR > 2.0 and is okay to schedule for cardioversion. Please let me know once this is arranged and I can cancel/move his INR check for next Friday.    Thanks,  [name]

## 2015-12-31 DIAGNOSIS — N186 End stage renal disease: Secondary | ICD-10-CM | POA: Diagnosis not present

## 2015-12-31 DIAGNOSIS — D631 Anemia in chronic kidney disease: Secondary | ICD-10-CM | POA: Diagnosis not present

## 2015-12-31 DIAGNOSIS — Z23 Encounter for immunization: Secondary | ICD-10-CM | POA: Diagnosis not present

## 2016-01-02 ENCOUNTER — Other Ambulatory Visit: Payer: Self-pay | Admitting: Cardiology

## 2016-01-02 ENCOUNTER — Ambulatory Visit (INDEPENDENT_AMBULATORY_CARE_PROVIDER_SITE_OTHER): Payer: Medicare Other | Admitting: Pharmacist

## 2016-01-02 DIAGNOSIS — N186 End stage renal disease: Secondary | ICD-10-CM | POA: Diagnosis not present

## 2016-01-02 DIAGNOSIS — I480 Paroxysmal atrial fibrillation: Secondary | ICD-10-CM

## 2016-01-02 DIAGNOSIS — I481 Persistent atrial fibrillation: Secondary | ICD-10-CM | POA: Diagnosis not present

## 2016-01-02 DIAGNOSIS — Z23 Encounter for immunization: Secondary | ICD-10-CM | POA: Diagnosis not present

## 2016-01-02 DIAGNOSIS — Z7901 Long term (current) use of anticoagulants: Secondary | ICD-10-CM

## 2016-01-02 DIAGNOSIS — Z01812 Encounter for preprocedural laboratory examination: Secondary | ICD-10-CM | POA: Diagnosis not present

## 2016-01-02 DIAGNOSIS — D631 Anemia in chronic kidney disease: Secondary | ICD-10-CM | POA: Diagnosis not present

## 2016-01-02 LAB — BASIC METABOLIC PANEL
BUN: 15 mg/dL (ref 7–25)
CHLORIDE: 95 mmol/L — AB (ref 98–110)
CO2: 25 mmol/L (ref 20–31)
CREATININE: 3.61 mg/dL — AB (ref 0.70–1.18)
Calcium: 8.3 mg/dL — ABNORMAL LOW (ref 8.6–10.3)
Glucose, Bld: 114 mg/dL — ABNORMAL HIGH (ref 65–99)
POTASSIUM: 3.5 mmol/L (ref 3.5–5.3)
Sodium: 134 mmol/L — ABNORMAL LOW (ref 135–146)

## 2016-01-02 LAB — CBC WITH DIFFERENTIAL/PLATELET
BASOS ABS: 0 {cells}/uL (ref 0–200)
Basophils Relative: 0 %
EOS PCT: 1 %
Eosinophils Absolute: 67 cells/uL (ref 15–500)
HCT: 34.1 % — ABNORMAL LOW (ref 38.5–50.0)
HEMOGLOBIN: 11.1 g/dL — AB (ref 13.2–17.1)
LYMPHS ABS: 1340 {cells}/uL (ref 850–3900)
Lymphocytes Relative: 20 %
MCH: 32.9 pg (ref 27.0–33.0)
MCHC: 32.6 g/dL (ref 32.0–36.0)
MCV: 101.2 fL — ABNORMAL HIGH (ref 80.0–100.0)
MONOS PCT: 8 %
MPV: 10.6 fL (ref 7.5–12.5)
Monocytes Absolute: 536 cells/uL (ref 200–950)
NEUTROS ABS: 4757 {cells}/uL (ref 1500–7800)
Neutrophils Relative %: 71 %
PLATELETS: 257 10*3/uL (ref 140–400)
RBC: 3.37 MIL/uL — AB (ref 4.20–5.80)
RDW: 17.7 % — ABNORMAL HIGH (ref 11.0–15.0)
WBC: 6.7 10*3/uL (ref 3.8–10.8)

## 2016-01-02 LAB — POCT INR: INR: 3.9

## 2016-01-02 NOTE — Telephone Encounter (Signed)
Spoke with wife (ok per DPR on file)  Scheduled DCCV w/ Camnitz on 01/13/2016 @ 10:00am. Pre procedure labs to be done at [facility] today when they go for pt's INR check. Aware to arrive at [facility] hospital at [facility]. Follow up appt made w/ Camntiz on 11/09.  Wife verbalized understanding and agreeable to plan.

## 2016-01-03 LAB — PROTIME-INR
INR: 3.8 — AB
PROTHROMBIN TIME: 37.7 s — AB (ref 9.0–11.5)

## 2016-01-05 DIAGNOSIS — N186 End stage renal disease: Secondary | ICD-10-CM | POA: Diagnosis not present

## 2016-01-05 DIAGNOSIS — Z23 Encounter for immunization: Secondary | ICD-10-CM | POA: Diagnosis not present

## 2016-01-05 DIAGNOSIS — D631 Anemia in chronic kidney disease: Secondary | ICD-10-CM | POA: Diagnosis not present

## 2016-01-07 DIAGNOSIS — N186 End stage renal disease: Secondary | ICD-10-CM | POA: Diagnosis not present

## 2016-01-07 DIAGNOSIS — D631 Anemia in chronic kidney disease: Secondary | ICD-10-CM | POA: Diagnosis not present

## 2016-01-07 DIAGNOSIS — Z23 Encounter for immunization: Secondary | ICD-10-CM | POA: Diagnosis not present

## 2016-01-08 NOTE — Telephone Encounter (Signed)
Informed wife procedure time changed to 1pm secondary to anesthesia.  (hospital called me to change time).  She is aware patient needs to arrive at noon.  Advised light/clear liquid breakfast before 6am.  NPO after 6am. She verbalized understanding and agreeable to plan.

## 2016-01-09 ENCOUNTER — Ambulatory Visit (INDEPENDENT_AMBULATORY_CARE_PROVIDER_SITE_OTHER): Payer: Medicare Other | Admitting: Pharmacist Clinician (PhC)/ Clinical Pharmacy Specialist

## 2016-01-09 DIAGNOSIS — I480 Paroxysmal atrial fibrillation: Secondary | ICD-10-CM | POA: Diagnosis not present

## 2016-01-09 DIAGNOSIS — Z7901 Long term (current) use of anticoagulants: Secondary | ICD-10-CM

## 2016-01-09 DIAGNOSIS — N186 End stage renal disease: Secondary | ICD-10-CM | POA: Diagnosis not present

## 2016-01-09 DIAGNOSIS — Z23 Encounter for immunization: Secondary | ICD-10-CM | POA: Diagnosis not present

## 2016-01-09 DIAGNOSIS — D631 Anemia in chronic kidney disease: Secondary | ICD-10-CM | POA: Diagnosis not present

## 2016-01-09 LAB — POCT INR: INR: 3.6

## 2016-01-12 DIAGNOSIS — Z23 Encounter for immunization: Secondary | ICD-10-CM | POA: Diagnosis not present

## 2016-01-12 DIAGNOSIS — D631 Anemia in chronic kidney disease: Secondary | ICD-10-CM | POA: Diagnosis not present

## 2016-01-12 DIAGNOSIS — N186 End stage renal disease: Secondary | ICD-10-CM | POA: Diagnosis not present

## 2016-01-13 ENCOUNTER — Ambulatory Visit (HOSPITAL_COMMUNITY)
Admission: RE | Admit: 2016-01-13 | Discharge: 2016-01-13 | Disposition: A | Discharge status: Home / Self Care | Payer: Medicare Other | Source: Ambulatory Visit | Attending: Cardiology | Admitting: Cardiology

## 2016-01-13 ENCOUNTER — Encounter (HOSPITAL_COMMUNITY): Payer: Self-pay | Admitting: *Deleted

## 2016-01-13 ENCOUNTER — Ambulatory Visit (HOSPITAL_COMMUNITY): Payer: Medicare Other | Admitting: Anesthesiology

## 2016-01-13 ENCOUNTER — Encounter (HOSPITAL_COMMUNITY)
Admission: RE | Disposition: A | Discharge status: Home / Self Care | Payer: Self-pay | Source: Ambulatory Visit | Attending: Cardiology

## 2016-01-13 DIAGNOSIS — I4891 Unspecified atrial fibrillation: Secondary | ICD-10-CM | POA: Diagnosis not present

## 2016-01-13 DIAGNOSIS — Z87891 Personal history of nicotine dependence: Secondary | ICD-10-CM | POA: Diagnosis not present

## 2016-01-13 DIAGNOSIS — E785 Hyperlipidemia, unspecified: Secondary | ICD-10-CM | POA: Diagnosis not present

## 2016-01-13 DIAGNOSIS — I120 Hypertensive chronic kidney disease with stage 5 chronic kidney disease or end stage renal disease: Secondary | ICD-10-CM | POA: Diagnosis not present

## 2016-01-13 DIAGNOSIS — K219 Gastro-esophageal reflux disease without esophagitis: Secondary | ICD-10-CM | POA: Insufficient documentation

## 2016-01-13 DIAGNOSIS — Z79899 Other long term (current) drug therapy: Secondary | ICD-10-CM | POA: Insufficient documentation

## 2016-01-13 DIAGNOSIS — E1122 Type 2 diabetes mellitus with diabetic chronic kidney disease: Secondary | ICD-10-CM | POA: Diagnosis not present

## 2016-01-13 DIAGNOSIS — Z950 Presence of cardiac pacemaker: Secondary | ICD-10-CM | POA: Insufficient documentation

## 2016-01-13 DIAGNOSIS — Z992 Dependence on renal dialysis: Secondary | ICD-10-CM | POA: Diagnosis not present

## 2016-01-13 DIAGNOSIS — Z947 Corneal transplant status: Secondary | ICD-10-CM | POA: Insufficient documentation

## 2016-01-13 DIAGNOSIS — E1142 Type 2 diabetes mellitus with diabetic polyneuropathy: Secondary | ICD-10-CM | POA: Insufficient documentation

## 2016-01-13 DIAGNOSIS — I481 Persistent atrial fibrillation: Secondary | ICD-10-CM | POA: Diagnosis not present

## 2016-01-13 DIAGNOSIS — N186 End stage renal disease: Secondary | ICD-10-CM | POA: Insufficient documentation

## 2016-01-13 DIAGNOSIS — I480 Paroxysmal atrial fibrillation: Secondary | ICD-10-CM | POA: Insufficient documentation

## 2016-01-13 DIAGNOSIS — Z8673 Personal history of transient ischemic attack (TIA), and cerebral infarction without residual deficits: Secondary | ICD-10-CM | POA: Diagnosis not present

## 2016-01-13 DIAGNOSIS — Z7901 Long term (current) use of anticoagulants: Secondary | ICD-10-CM | POA: Diagnosis not present

## 2016-01-13 DIAGNOSIS — I129 Hypertensive chronic kidney disease with stage 1 through stage 4 chronic kidney disease, or unspecified chronic kidney disease: Secondary | ICD-10-CM | POA: Diagnosis not present

## 2016-01-13 DIAGNOSIS — F329 Major depressive disorder, single episode, unspecified: Secondary | ICD-10-CM | POA: Insufficient documentation

## 2016-01-13 DIAGNOSIS — E139 Other specified diabetes mellitus without complications: Secondary | ICD-10-CM | POA: Diagnosis not present

## 2016-01-13 HISTORY — PX: CARDIOVERSION: SHX1299

## 2016-01-13 LAB — POCT I-STAT 4, (NA,K, GLUC, HGB,HCT)
Glucose, Bld: 92 mg/dL (ref 65–99)
HEMATOCRIT: 31 % — AB (ref 39.0–52.0)
HEMOGLOBIN: 10.5 g/dL — AB (ref 13.0–17.0)
Potassium: 5.6 mmol/L — ABNORMAL HIGH (ref 3.5–5.1)
Sodium: 138 mmol/L (ref 135–145)

## 2016-01-13 SURGERY — CARDIOVERSION
Anesthesia: General

## 2016-01-13 MED ORDER — SODIUM CHLORIDE 0.9 % IV SOLN
INTRAVENOUS | Status: DC
  Administered 2016-01-13: 500 mL via INTRAVENOUS

## 2016-01-13 MED ORDER — LIDOCAINE 2% (20 MG/ML) 5 ML SYRINGE
INTRAMUSCULAR | Status: DC | PRN
  Administered 2016-01-13: 40 mg via INTRAVENOUS

## 2016-01-13 MED ORDER — PROPOFOL 10 MG/ML IV BOLUS
INTRAVENOUS | Status: DC | PRN
  Administered 2016-01-13: 60 mg via INTRAVENOUS

## 2016-01-13 MED ORDER — FENTANYL CITRATE (PF) 100 MCG/2ML IJ SOLN: 25.0000 ug | INTRAMUSCULAR | Status: DC | PRN

## 2016-01-13 MED ORDER — ONDANSETRON HCL 4 MG/2ML IJ SOLN: 4.0000 mg | Freq: Once | INTRAMUSCULAR | Status: DC | PRN

## 2016-01-13 NOTE — Transfer of Care (Signed)
Immediate Anesthesia Transfer of Care Note  Patient: Alexander [name]  Procedure(s) Performed: Procedure(s): CARDIOVERSION (N/A)  Patient Location: PACU  Anesthesia Type:MAC  Level of Consciousness: awake and alert   Airway & Oxygen Therapy: Patient Spontanous Breathing  Post-op Assessment: Report given to RN and Post -op Vital signs reviewed and stable  Post vital signs: Reviewed and stable  Last Vitals:  Vitals:   01/13/16 1240  BP: (!) 151/99  Pulse: 95  Resp: (!) 22  Temp: 36.7 C    Last Pain:  Vitals:   01/13/16 1240  TempSrc: Oral         Complications: No apparent anesthesia complications

## 2016-01-13 NOTE — Anesthesia Preprocedure Evaluation (Addendum)
Anesthesia Evaluation  Patient identified by MRN, date of birth, ID band Patient awake    Reviewed: Allergy & Precautions, H&P , NPO status , Patient's Chart, lab work & pertinent test results  Airway Mallampati: IV  TM Distance: >3 FB Neck ROM: Full    Dental no notable dental hx. (+) Teeth Intact, Dental Advisory Given, Chipped,    Pulmonary neg pulmonary ROS, former smoker,    Pulmonary exam normal breath sounds clear to auscultation       Cardiovascular hypertension, + dysrhythmias Atrial Fibrillation + Valvular Problems/Murmurs AS  Rhythm:Irregular Rate:Normal + Systolic murmurs    Neuro/Psych Depression negative neurological ROS     GI/Hepatic Neg liver ROS, GERD  Medicated and Controlled,  Endo/Other  diabetes  Renal/GU ESRF and DialysisRenal disease  negative genitourinary   Musculoskeletal   Abdominal   Peds  Hematology negative hematology ROS (+)   Anesthesia Other Findings   Reproductive/Obstetrics negative OB ROS                            Anesthesia Physical Anesthesia Plan  ASA: III  Anesthesia Plan: General   Post-op Pain Management:    Induction: Intravenous  Airway Management Planned: Mask  Additional Equipment:   Intra-op Plan:   Post-operative Plan:   Informed Consent: I have reviewed the patients History and Physical, chart, labs and discussed the procedure including the risks, benefits and alternatives for the proposed anesthesia with the patient or authorized representative who has indicated his/her understanding and acceptance.   Dental advisory given  Plan Discussed with: CRNA  Anesthesia Plan Comments:         Anesthesia Quick Evaluation

## 2016-01-13 NOTE — Progress Notes (Signed)
Unable to print AVS at this time, printed discharge instructions given to patient and family.

## 2016-01-13 NOTE — Anesthesia Postprocedure Evaluation (Signed)
Anesthesia Post Note  Patient: Alexander [name]  Procedure(s) Performed: Procedure(s) (LRB): CARDIOVERSION (N/A)  Patient location during evaluation: PACU Anesthesia Type: General Level of consciousness: awake and alert Pain management: pain level controlled Vital Signs Assessment: post-procedure vital signs reviewed and stable Respiratory status: spontaneous breathing, nonlabored ventilation and respiratory function stable Cardiovascular status: blood pressure returned to baseline and stable Postop Assessment: no signs of nausea or vomiting Anesthetic complications: no    Last Vitals:  Vitals:   01/13/16 1335 01/13/16 1345  BP: 111/61 123/69  Pulse: 66 60  Resp: 19 (!) 22  Temp:      Last Pain:  Vitals:   01/13/16 1240  TempSrc: Oral                 Jamaul Heist,W. EDMOND

## 2016-01-13 NOTE — Op Note (Signed)
Procedure: Electrical Cardioversion Indications:  Atrial Fibrillation  Procedure Details:  Consent: Risks of procedure as well as the alternatives and risks of each were explained to the (patient/caregiver).  Consent for procedure obtained.  Time Out: Verified patient identification, verified procedure, site/side was marked, verified correct patient position, special equipment/implants available, medications/allergies/relevent history reviewed, required imaging and test results available.  Performed  Patient placed on cardiac monitor, pulse oximetry, supplemental oxygen as necessary.  Sedation given per anesthesia report Pacer pads placed anterior and posterior chest.  Cardioverted 2 time(s).  Cardioverted at [place].  Evaluation: Findings: Post procedure EKG shows: NSR Complications: None Patient did tolerate procedure well.  Javoni Lucken Curt Bears, MD 01/13/2016 1:34 PM

## 2016-01-13 NOTE — H&P (Signed)
ID:  Alexander [name], DOB 1940-08-12, MRN [id number]  PCP:  Horatio Pel, MD      Cardiologist:  Skeet Latch Primary Electrophysiologist:  Constance Haw, MD           Chief Complaint  Patient presents with  . Pacemaker Check    persistent Afib     History of Present Illness: Alexander [name] is a 75 y.o. male who presents today for electrophysiology evaluation.   He has a history of moderate aortic stenosis, ESRD on HD, diabetes mellitus, hyperlipidemia, carotid artery disease with complete occlusion of the L carotid, prior TIA and atrial fibrillation.  He had a dual chamber pacemaker placed on 03/11/15. At that time, he was feeling weak and dizzy with presyncopal symptoms.   Today, he denies symptoms of palpitations, chest pain, shortness of breath, orthopnea, PND, lower extremity edema, claudication bleeding, or neurologic sequela. He has been feeling significant fatigue and shortness of breath. Plan for cardioversion today.      Past Medical History:  Diagnosis Date  . Anemia   . Aortic stenosis 06/15/12   TEE - EF 0000000; grade 1 diastolic dysfunction; mild/mod aortic valve stenosis; Mitral valve had calcified annulus, mild pulm htn PA peak pressure 76mmHg  . Barrett's esophagus 05/2003  . Bradycardia   . Carpal tunnel syndrome, bilateral 11/03/2015  . Colon polyps   . CVA (cerebral infarction)    2004/affected left side  . Depression   . Diabetes mellitus without complication ([place])   . Diabetic peripheral neuropathy ([place]) 10/02/2015  . Diverticulosis   . End stage renal disease ([place])    hemodialysis 3 times a week  . GERD (gastroesophageal reflux disease)   . Hyperlipidemia   . Hypertension   . Kidney stones   . Macular degeneration    both eyes  . Orthostatic hypotension 09/09/2015  . Paroxysmal atrial fibrillation ([facility])   . Peptic ulcer    bleeding, 1969  . S/P epidural steroid injection    last  injection over 10 years ago   . Seasonal allergies   . Tubular adenoma of colon 07/2001        Past Surgical History:  Procedure Laterality Date  . AV FISTULA PLACEMENT  2009  . BACK SURGERY    . CARDIOVERSION N/A 11/13/2015   Procedure: CARDIOVERSION;  Surgeon: Troy Sine, MD;  Location: [facility];  Service: Cardiovascular;  Laterality: N/A;  . CORNEAL TRANSPLANT  1999   right eye  . CYSTOSCOPY  several times   kidney stones  . EP IMPLANTABLE DEVICE N/A 03/11/2015   Procedure: Pacemaker Implant;  Surgeon: Allyn Bartelson Meredith [name], MD;  Location: [place] [facility];  Service: Cardiovascular;  Laterality: N/A;  . LAMINECTOMY  1969  . TONSILLECTOMY  1964     Current Outpatient Prescriptions  Medication Sig Dispense Refill  . acetaminophen (TYLENOL) 500 MG tablet Take 1,000 mg by mouth every 6 (six) hours as needed for pain.    [name] amiodarone (PACERONE) 200 MG tablet Take 1 tablet (200 mg total) by mouth daily. 45 tablet 0  . atorvastatin (LIPITOR) 40 MG tablet Take 20 mg by mouth daily.    [name] b complex-vitamin c-folic acid (NEPHRO-VITE) 0.8 MG TABS Take 1 tablet by mouth daily.     . bromocriptine (PARLODEL) 5 MG capsule Take 5 mg by mouth at bedtime.     . calcium acetate (PHOSLO) 667 MG capsule Take 667 mg by mouth 4 (four) times daily.     [name]  cetirizine (ZYRTEC) 10 MG tablet Take 10 mg by mouth at bedtime.     . cinacalcet (SENSIPAR) 30 MG tablet Take 30 mg by mouth daily.     . dorzolamide-timolol (COSOPT) 22.3-6.8 MG/ML ophthalmic solution Place 1 drop into the right eye 2 (two) times daily.    [name] FLUoxetine (PROZAC) 20 MG capsule Take 40 mg by mouth daily.     . midodrine (PROAMATINE) 5 MG tablet Take 3 tablets (15 mg total) by mouth 3 (three) times daily as needed (for systolic blood pressure less than 130). (Patient taking differently: Take 5 mg by mouth 3 (three) times daily as needed (for systolic blood pressure less than 130). ) 270 tablet 3  . omeprazole (PRILOSEC) 20 MG  capsule Take 20 mg by mouth daily.    [name] warfarin (COUMADIN) 5 MG tablet TAKE BY MOUTH DAILY AS DIRECTED BY COUMADIN CLINIC (Patient taking differently: Take 5-7.5 mg by mouth daily at 6 PM. Takes 7.5mg -MON, WED, FRI, SAT Takes 5mg -TUES< THURS, SUN) 90 tablet 3   No current facility-administered medications for this visit.     Allergies:   Codeine; Penicillins; and Tramadol   Social History:  The patient  reports that he quit smoking about 17 years ago. He has never used smokeless tobacco. He reports that he does not drink alcohol or use drugs.   Family History:  The patient's family history includes Cancer in his brother; Heart disease in his sister; Hypertension in his father; Stomach cancer in his mother; Stroke in his sister.    ROS:  Please see the history of present illness.   Otherwise, review of systems is positive for none.   All other systems are reviewed and negative.    PHYSICAL EXAM: There were no vitals filed for this visit.  GEN: Well nourished, well developed, in no acute distress  HEENT: normal  Neck: no JVD, carotid bruits, or masses Cardiac:  irregular; no murmurs, rubs, or gallops,no edema  Respiratory:  clear to auscultation bilaterally, normal work of breathing GI: soft, nontender, nondistended, + BS MS: no deformity or atrophy  Skin: warm and dry, fistula left forearm, device pocket C/D/I Neuro:  Strength and sensation are intact Psych: euthymic mood, full affect  EKG:  EKG is ordered today. Personal review of the ECG shows atrial fibrillation, rate 97  Device interrogation performed today, results in [facility].   Recent Labs: 12/18/2014: TSH 1.368 10/04/2015: ALT 20; BUN 37; Creatinine, Ser 6.85; Platelets 209 11/13/2015: Hemoglobin 10.2; Potassium 3.8; Sodium 142    Lipid Panel  Labs(Brief)          Component Value Date/Time   CHOL 138 10/28/2015 1008   TRIG 158 (H) 10/28/2015 1008   HDL 30 (L) 10/28/2015 1008   CHOLHDL 4.6  10/28/2015 1008   VLDL 32 (H) 10/28/2015 1008   LDLCALC 76 10/28/2015 1008          Wt Readings from Last 3 Encounters:  12/09/15 209 lb (94.8 kg)  11/13/15 202 lb (91.6 kg)  10/17/15 208 lb 3.2 oz (94.4 kg)      11/28/14 TTE: Study Conclusions  - Left ventricle: The cavity size was normal. Systolic function was normal. The estimated ejection fraction was in the range of 60% to 65%. Wall motion was normal; there were no regional wall motion abnormalities. Features are consistent with a pseudonormal left ventricular filling pattern, with concomitant abnormal relaxation and increased filling pressure (grade 2 diastolic dysfunction). Doppler parameters are consistent with high ventricular filling  pressure. - Aortic valve: Severe diffuse thickening and calcification. There was moderate stenosis. VTI ratio of LVOT to aortic valve: 0.31. Valve area (VTI): 1.27 cm^2. Indexed valve area (VTI): 0.59 cm^2/m^2. Mean velocity ratio of LVOT to aortic valve: 0.31. Valve area (Vmean): 1.29 cm^2. Indexed valve area (Vmean): 0.6 cm^2/m^2.  Mean gradient (S): 22 mm Hg. - Mitral valve: Severely calcified annulus. There was mild regurgitation. - Left atrium: The atrium was mildly dilated. - Right ventricle: The cavity size was mildly dilated. Wall thickness was normal. - Tricuspid valve: There was trivial regurgitation. - Pulmonic valve: There was trivial regurgitation. - Pulmonary arteries: PA peak pressure: 37 mm Hg (S).   ASSESSMENT AND PLAN:  1.  Atrial fibrillation: on warfarin.  Has CHADS2VASc of 4.  Currently taking warfarin. Therapeutic on doses.  [place] Well plan for cardioversion and continued amiodarone.   2. Presyncope: Dual-chamber pacemaker placed in December. Functioning appropriately.   Current medicines are reviewed at length with the patient today.   The patient has concerns regarding his medicines.  The following changes were made today:   none  Labs/ tests ordered today include:   No orders of the defined types were placed in this encounter.

## 2016-01-13 NOTE — Discharge Instructions (Signed)
Electrical Cardioversion, Care After °Refer to this sheet in the next few weeks. These instructions provide you with information on caring for yourself after your procedure. Your health care provider may also give you more specific instructions. Your treatment has been planned according to current medical practices, but problems sometimes occur. Call your health care provider if you have any problems or questions after your procedure. °WHAT TO EXPECT AFTER THE PROCEDURE °After your procedure, it is typical to have the following sensations: °· Some redness on the skin where the shocks were delivered. If this is tender, a sunburn lotion or hydrocortisone cream may help. °· Possible return of an abnormal heart rhythm within hours or days after the procedure. °HOME CARE INSTRUCTIONS °· Take medicines only as directed by your health care provider. Be sure you understand how and when to take your medicine. °· Learn how to feel your pulse and check it often. °· Limit your activity for 48 hours after the procedure or as directed by your health care provider. °· Avoid or minimize caffeine and other stimulants as directed by your health care provider. °SEEK MEDICAL CARE IF: °· You feel like your heart is beating too fast or your pulse is not regular. °· You have any questions about your medicines. °· You have bleeding that will not stop. °SEEK IMMEDIATE MEDICAL CARE IF: °· You are dizzy or feel faint. °· It is hard to breathe or you feel short of breath. °· There is a change in discomfort in your chest. °· Your speech is slurred or you have trouble moving an arm or leg on one side of your body. °· You get a serious muscle cramp that does not go away. °· Your fingers or toes turn cold or blue. °  °This information is not intended to replace advice given to you by your health care provider. Make sure you discuss any questions you have with your health care provider. °  °Document Released: 01/03/2013 Document Revised: 04/05/2014  Document Reviewed: 01/03/2013 °Elsevier Interactive Patient Education ©2016 [employer] ° °

## 2016-01-14 ENCOUNTER — Encounter (HOSPITAL_COMMUNITY): Payer: Self-pay | Admitting: Cardiology

## 2016-01-14 DIAGNOSIS — N186 End stage renal disease: Secondary | ICD-10-CM | POA: Diagnosis not present

## 2016-01-14 DIAGNOSIS — Z23 Encounter for immunization: Secondary | ICD-10-CM | POA: Diagnosis not present

## 2016-01-14 DIAGNOSIS — M7582 Other shoulder lesions, left shoulder: Secondary | ICD-10-CM | POA: Diagnosis not present

## 2016-01-14 DIAGNOSIS — M1189 Other specified crystal arthropathies, multiple sites: Secondary | ICD-10-CM | POA: Diagnosis not present

## 2016-01-14 DIAGNOSIS — D631 Anemia in chronic kidney disease: Secondary | ICD-10-CM | POA: Diagnosis not present

## 2016-01-16 DIAGNOSIS — N186 End stage renal disease: Secondary | ICD-10-CM | POA: Diagnosis not present

## 2016-01-16 DIAGNOSIS — Z23 Encounter for immunization: Secondary | ICD-10-CM | POA: Diagnosis not present

## 2016-01-16 DIAGNOSIS — D631 Anemia in chronic kidney disease: Secondary | ICD-10-CM | POA: Diagnosis not present

## 2016-01-17 ENCOUNTER — Encounter (HOSPITAL_COMMUNITY): Payer: Self-pay | Admitting: Emergency Medicine

## 2016-01-17 ENCOUNTER — Encounter (HOSPITAL_COMMUNITY): Payer: Self-pay | Admitting: Family Medicine

## 2016-01-17 ENCOUNTER — Emergency Department (HOSPITAL_COMMUNITY)
Admission: EM | Admit: 2016-01-17 | Discharge: 2016-01-17 | Disposition: A | Discharge status: Home / Self Care | Payer: Medicare Other | Attending: Emergency Medicine | Admitting: Emergency Medicine

## 2016-01-17 ENCOUNTER — Emergency Department (HOSPITAL_COMMUNITY): Payer: Medicare Other

## 2016-01-17 ENCOUNTER — Ambulatory Visit (HOSPITAL_COMMUNITY)
Admission: EM | Admit: 2016-01-17 | Discharge: 2016-01-17 | Disposition: A | Discharge status: Home / Self Care | Payer: Medicare Other | Attending: Family Medicine | Admitting: Family Medicine

## 2016-01-17 DIAGNOSIS — J069 Acute upper respiratory infection, unspecified: Secondary | ICD-10-CM

## 2016-01-17 DIAGNOSIS — H7291 Unspecified perforation of tympanic membrane, right ear: Secondary | ICD-10-CM | POA: Diagnosis not present

## 2016-01-17 DIAGNOSIS — I120 Hypertensive chronic kidney disease with stage 5 chronic kidney disease or end stage renal disease: Secondary | ICD-10-CM | POA: Diagnosis not present

## 2016-01-17 DIAGNOSIS — Z8673 Personal history of transient ischemic attack (TIA), and cerebral infarction without residual deficits: Secondary | ICD-10-CM | POA: Diagnosis not present

## 2016-01-17 DIAGNOSIS — J019 Acute sinusitis, unspecified: Secondary | ICD-10-CM | POA: Insufficient documentation

## 2016-01-17 DIAGNOSIS — Z87891 Personal history of nicotine dependence: Secondary | ICD-10-CM | POA: Insufficient documentation

## 2016-01-17 DIAGNOSIS — R05 Cough: Secondary | ICD-10-CM | POA: Diagnosis not present

## 2016-01-17 DIAGNOSIS — Z7901 Long term (current) use of anticoagulants: Secondary | ICD-10-CM | POA: Diagnosis not present

## 2016-01-17 DIAGNOSIS — N186 End stage renal disease: Secondary | ICD-10-CM | POA: Insufficient documentation

## 2016-01-17 DIAGNOSIS — E1140 Type 2 diabetes mellitus with diabetic neuropathy, unspecified: Secondary | ICD-10-CM | POA: Diagnosis not present

## 2016-01-17 DIAGNOSIS — Z79899 Other long term (current) drug therapy: Secondary | ICD-10-CM | POA: Insufficient documentation

## 2016-01-17 DIAGNOSIS — H9201 Otalgia, right ear: Secondary | ICD-10-CM | POA: Diagnosis not present

## 2016-01-17 LAB — PROTIME-INR
INR: 3.28
Prothrombin Time: 34.1 seconds — ABNORMAL HIGH (ref 11.4–15.2)

## 2016-01-17 MED ORDER — AEROCHAMBER PLUS FLO-VU MEDIUM MISC
1.0000 | Freq: Once | Status: AC
  Administered 2016-01-17: 1
  Filled 2016-01-17: qty 1

## 2016-01-17 MED ORDER — CEFDINIR 300 MG PO CAPS: 300.0000 mg | ORAL_CAPSULE | ORAL | 0 refills | Status: DC

## 2016-01-17 MED ORDER — ALBUTEROL SULFATE HFA 108 (90 BASE) MCG/ACT IN AERS
1.0000 | INHALATION_SPRAY | RESPIRATORY_TRACT | Status: DC | PRN
  Administered 2016-01-17: 1 via RESPIRATORY_TRACT
  Filled 2016-01-17: qty 6.7

## 2016-01-17 NOTE — ED Provider Notes (Signed)
[place]    CSN: [id number] Arrival date & time: 01/17/16  1209     History   Chief Complaint Chief Complaint  Patient presents with  . Otalgia  . Nasal Congestion    HPI Alexander [name] is a 75 y.o. male.   The history is provided by the patient and the spouse.  Otalgia  Location:  Right Behind ear:  No abnormality Quality:  Throbbing Severity:  Mild Onset quality:  Sudden Duration:  2 days Progression:  Unchanged Chronicity:  New Relieved by:  None tried Worsened by:  Nothing Ineffective treatments:  None tried Associated symptoms: congestion, cough, ear discharge and rhinorrhea   Associated symptoms: no fever and no sore throat     Past Medical History:  Diagnosis Date  . Anemia   . Aortic stenosis 06/15/12   TEE - EF 0000000; grade 1 diastolic dysfunction; mild/mod aortic valve stenosis; Mitral valve had calcified annulus, mild pulm htn PA peak pressure 52mmHg  . Barrett's esophagus 05/2003  . Bradycardia   . Carpal tunnel syndrome, bilateral 11/03/2015  . Colon polyps   . CVA (cerebral infarction)    2004/affected left side  . Depression   . Diabetes mellitus without complication ([place])   . Diabetic peripheral neuropathy ([place]) 10/02/2015  . Diverticulosis   . End stage renal disease ([place])    hemodialysis 3 times a week  . GERD (gastroesophageal reflux disease)   . Hyperlipidemia   . Hypertension   . Kidney stones   . Macular degeneration    both eyes  . Orthostatic hypotension 09/09/2015  . Paroxysmal atrial fibrillation ([facility])   . Peptic ulcer    bleeding, 1969  . S/P epidural steroid injection    last  injection over 10 years ago  . Seasonal allergies   . Tubular adenoma of colon 07/2001    Patient Active Problem List   Diagnosis Date Noted  . Persistent atrial fibrillation ([place])   . Carpal tunnel syndrome, bilateral 11/03/2015  . Hand pain 10/02/2015  . Diabetic peripheral neuropathy ([place]) 10/02/2015  . Orthostatic hypotension  09/09/2015  . Bradycardia 03/11/2015  . End stage renal disease ([place]) 01/28/2015  . Left arm pain 01/28/2015  . Long-term (current) use of anticoagulants 04/08/2014  . Acute bronchitis 04/05/2014  . Hyperlipidemia 04/04/2014  . ESRD (end stage renal disease) on dialysis 03/13/2013  . PAF (paroxysmal atrial fibrillation) ([place]) 12/12/2012  . PAD (peripheral artery disease) ([place]) 12/12/2012  . Chest pain 12/08/2012  . Aortic stenosis 06/15/2012  . Personal history of colonic polyps 02/03/2011  . Esophageal reflux 02/03/2011  . Anemia, unspecified 02/03/2011    Past Surgical History:  Procedure Laterality Date  . AV FISTULA PLACEMENT  2009  . BACK SURGERY    . CARDIOVERSION N/A 11/13/2015   Procedure: CARDIOVERSION;  Surgeon: Troy Sine, MD;  Location: [facility] ENDOSCOPY;  Service: Cardiovascular;  Laterality: N/A;  . CARDIOVERSION N/A 01/13/2016   Procedure: CARDIOVERSION;  Surgeon: Will Meredith [name], MD;  Location: [facility];  Service: Cardiovascular;  Laterality: N/A;  . [place]   right eye  . CYSTOSCOPY  several times   kidney stones  . EP IMPLANTABLE DEVICE N/A 03/11/2015   Procedure: Pacemaker Implant;  Surgeon: Will Meredith [name], MD;  Location: [facility];  Service: Cardiovascular;  Laterality: N/A;  . LAMINECTOMY  1969  . TONSILLECTOMY  1964       Home Medications    Prior to Admission medications  Medication Sig Start Date End Date Taking? Authorizing Provider  acetaminophen (TYLENOL) 500 MG tablet Take 1,000 mg by mouth every 6 (six) hours as needed for pain.    Historical Provider, MD  amiodarone (PACERONE) 200 MG tablet TAKE 1 TABLET BY MOUTH DAILY 12/23/15   Will Meredith [name], MD  atorvastatin (LIPITOR) 40 MG tablet Take 20 mg by mouth daily.    Historical Provider, MD  b complex-vitamin c-folic acid (NEPHRO-VITE) 0.8 MG TABS Take 1 tablet by mouth daily.     Historical Provider, MD  bromocriptine (PARLODEL) 5 MG capsule Take 5 mg  by mouth at bedtime.  11/30/10   Historical Provider, MD  calcium acetate (PHOSLO) 667 MG capsule Take 667 mg by mouth 4 (four) times daily.     Historical Provider, MD  cetirizine (ZYRTEC) 10 MG tablet Take 10 mg by mouth at bedtime.     Historical Provider, MD  cinacalcet (SENSIPAR) 30 MG tablet Take 30 mg by mouth daily.     Historical Provider, MD  dorzolamide-timolol (COSOPT) 22.3-6.8 MG/ML ophthalmic solution Place 1 drop into the right eye 2 (two) times daily. 08/13/13   Historical Provider, MD  FLUoxetine (PROZAC) 20 MG capsule Take 40 mg by mouth daily.  11/30/10   Historical Provider, MD  midodrine (PROAMATINE) 5 MG tablet Take 3 tablets (15 mg total) by mouth 3 (three) times daily as needed (for systolic blood pressure less than 130). Patient taking differently: Take 5 mg by mouth 3 (three) times daily as needed (for systolic blood pressure less than 130).  09/09/15   Skeet Latch, MD  omeprazole (PRILOSEC) 20 MG capsule Take 20 mg by mouth daily.    Historical Provider, MD  warfarin (COUMADIN) 5 MG tablet TAKE BY MOUTH DAILY AS DIRECTED BY COUMADIN CLINIC Patient taking differently: Take 5-7.5 mg by mouth daily at 6 PM. Takes 7.5mg -MON, WED, FRI, SAT Takes 5mg -TUES< Raynelle Dick, SUN 09/09/15   Skeet Latch, MD    Family History Family History  Problem Relation Age of Onset  . Stomach cancer Mother   . Hypertension Father     Died of heart attack  . Stroke Sister   . Heart disease Sister   . Cancer Brother   . Colon cancer Neg Hx     Social History Social History  Substance Use Topics  . Smoking status: Former Smoker    Quit date: 01/20/1998  . Smokeless tobacco: Never Used  . Alcohol use No     Allergies   Codeine; Penicillins; and Tramadol   Review of Systems Review of Systems  Constitutional: Negative.  Negative for fever.  HENT: Positive for congestion, ear discharge, ear pain and rhinorrhea. Negative for sore throat.   Respiratory: Positive for cough.     Cardiovascular: Negative.      Physical Exam Triage Vital Signs ED Triage Vitals [01/17/16 1304]  Enc Vitals Group     BP 137/84     Pulse Rate 97     Resp 18     Temp 97.6 F (36.4 C)     Temp src      SpO2 100 %     Weight      Height      Head Circumference      Peak Flow      Pain Score      Pain Loc      Pain Edu?      Excl. in [place]?    No data found.   Updated Vital  Signs BP 137/84   Pulse 97 Comment: irregular  Temp 97.6 F (36.4 C)   Resp 18   SpO2 100%   Visual Acuity Right Eye Distance:   Left Eye Distance:   Bilateral Distance:    Right Eye Near:   Left Eye Near:    Bilateral Near:     Physical Exam  Constitutional: He is oriented to person, place, and time. He appears well-developed and well-nourished. No distress.  HENT:  Right Ear: Tympanic membrane is perforated. There is hemotympanum. Decreased hearing is noted.  Left Ear: External ear normal.  Nose: Nose normal.  Mouth/Throat: Oropharynx is clear and moist.  Neck: Normal range of motion. Neck supple.  Cardiovascular: Intact distal pulses.  An irregularly irregular rhythm present. Tachycardia present.  PMI is not displaced.   Pulmonary/Chest: Effort normal. He has rales.  Lymphadenopathy:    He has no cervical adenopathy.  Neurological: He is alert and oriented to person, place, and time.  Skin: Skin is warm and dry.  Nursing note and vitals reviewed.    UC Treatments / Results  Labs (all labs ordered are listed, but only abnormal results are displayed) Labs Reviewed - No data to display  EKG  EKG Interpretation None       Radiology No results found.  Procedures Procedures (including critical care time)  Medications Ordered in UC Medications - No data to display   Initial Impression / Assessment and Plan / UC Course  I have reviewed the triage vital signs and the nursing notes.  Pertinent labs & imaging results that were available during my care of the patient were  reviewed by me and considered in my medical decision making (see chart for details).  Clinical Course    Sent for mult med problem eval.right ear bleeding, on coumadin, afib, dialysis, cough., pacemaker.  Final Clinical Impressions(s) / UC Diagnoses   Final diagnoses:  None    New Prescriptions New Prescriptions   No medications on file     Billy Fischer, MD 01/17/16 1315

## 2016-01-17 NOTE — ED Provider Notes (Signed)
[facility] DEPT Provider Note   CSN: [id number] Arrival date & time: 01/17/16  1329     History   Chief Complaint Chief Complaint  Patient presents with  . Otalgia    unable to hear    HPI Alexander [name] is a 75 y.o. male.  Pt presents to the ED today with bleeding from right ear.  The pt said that he noticed it yesterday after dialysis.  The pt denies any pain, but c/o not being able to hear.  The pt is on coumadin for a.fib and had a cardioversion last week.  The pt has also had sinus pressure and a cough.      Past Medical History:  Diagnosis Date  . Anemia   . Aortic stenosis 06/15/12   TEE - EF 0000000; grade 1 diastolic dysfunction; mild/mod aortic valve stenosis; Mitral valve had calcified annulus, mild pulm htn PA peak pressure 69mmHg  . Barrett's esophagus 05/2003  . Bradycardia   . Carpal tunnel syndrome, bilateral 11/03/2015  . Colon polyps   . CVA (cerebral infarction)    2004/affected left side  . Depression   . Diabetes mellitus without complication ([place])   . Diabetic peripheral neuropathy ([place]) 10/02/2015  . Diverticulosis   . End stage renal disease ([place])    hemodialysis 3 times a week  . GERD (gastroesophageal reflux disease)   . Hyperlipidemia   . Hypertension   . Kidney stones   . Macular degeneration    both eyes  . Orthostatic hypotension 09/09/2015  . Paroxysmal atrial fibrillation ([facility])   . Peptic ulcer    bleeding, 1969  . S/P epidural steroid injection    last  injection over 10 years ago  . Seasonal allergies   . Tubular adenoma of colon 07/2001    Patient Active Problem List   Diagnosis Date Noted  . Persistent atrial fibrillation ([place])   . Carpal tunnel syndrome, bilateral 11/03/2015  . Hand pain 10/02/2015  . Diabetic peripheral neuropathy ([place]) 10/02/2015  . Orthostatic hypotension 09/09/2015  . Bradycardia 03/11/2015  . End stage renal disease ([place]) 01/28/2015  . Left arm pain 01/28/2015  . Long-term (current) use of  anticoagulants 04/08/2014  . Acute bronchitis 04/05/2014  . Hyperlipidemia 04/04/2014  . ESRD (end stage renal disease) on dialysis 03/13/2013  . PAF (paroxysmal atrial fibrillation) ([place]) 12/12/2012  . PAD (peripheral artery disease) ([place]) 12/12/2012  . Chest pain 12/08/2012  . Aortic stenosis 06/15/2012  . Personal history of colonic polyps 02/03/2011  . Esophageal reflux 02/03/2011  . Anemia, unspecified 02/03/2011    Past Surgical History:  Procedure Laterality Date  . AV FISTULA PLACEMENT  2009  . BACK SURGERY    . CARDIOVERSION N/A 11/13/2015   Procedure: CARDIOVERSION;  Surgeon: Troy Sine, MD;  Location: [facility] ENDOSCOPY;  Service: Cardiovascular;  Laterality: N/A;  . CARDIOVERSION N/A 01/13/2016   Procedure: CARDIOVERSION;  Surgeon: Will Meredith [name], MD;  Location: [facility];  Service: Cardiovascular;  Laterality: N/A;  . [place]   right eye  . CYSTOSCOPY  several times   kidney stones  . EP IMPLANTABLE DEVICE N/A 03/11/2015   Procedure: Pacemaker Implant;  Surgeon: Will Meredith [name], MD;  Location: [place] CV LAB;  Service: Cardiovascular;  Laterality: N/A;  . LAMINECTOMY  1969  . TONSILLECTOMY  1964       Home Medications    Prior to Admission medications   Medication Sig Start Date End Date Taking? Authorizing Provider  acetaminophen (  TYLENOL) 500 MG tablet Take 1,000 mg by mouth every 6 (six) hours as needed for pain.    Historical Provider, MD  amiodarone (PACERONE) 200 MG tablet TAKE 1 TABLET BY MOUTH DAILY 12/23/15   Will Meredith [name], MD  atorvastatin (LIPITOR) 40 MG tablet Take 20 mg by mouth daily.    Historical Provider, MD  b complex-vitamin c-folic acid (NEPHRO-VITE) 0.8 MG TABS Take 1 tablet by mouth daily.     Historical Provider, MD  bromocriptine (PARLODEL) 5 MG capsule Take 5 mg by mouth at bedtime.  11/30/10   Historical Provider, MD  calcium acetate (PHOSLO) 667 MG capsule Take 667 mg by mouth 4 (four) times daily.      Historical Provider, MD  cefdinir (OMNICEF) 300 MG capsule Take 1 capsule (300 mg total) by mouth every other day. Take after dialysis 01/17/16   Isla Pence, MD  cetirizine (ZYRTEC) 10 MG tablet Take 10 mg by mouth at bedtime.     Historical Provider, MD  cinacalcet (SENSIPAR) 30 MG tablet Take 30 mg by mouth daily.     Historical Provider, MD  dorzolamide-timolol (COSOPT) 22.3-6.8 MG/ML ophthalmic solution Place 1 drop into the right eye 2 (two) times daily. 08/13/13   Historical Provider, MD  FLUoxetine (PROZAC) 20 MG capsule Take 40 mg by mouth daily.  11/30/10   Historical Provider, MD  midodrine (PROAMATINE) 5 MG tablet Take 3 tablets (15 mg total) by mouth 3 (three) times daily as needed (for systolic blood pressure less than 130). Patient taking differently: Take 5 mg by mouth 3 (three) times daily as needed (for systolic blood pressure less than 130).  09/09/15   Skeet Latch, MD  omeprazole (PRILOSEC) 20 MG capsule Take 20 mg by mouth daily.    Historical Provider, MD  warfarin (COUMADIN) 5 MG tablet TAKE BY MOUTH DAILY AS DIRECTED BY COUMADIN CLINIC Patient taking differently: Take 5-7.5 mg by mouth daily at 6 PM. Takes 7.5mg -MON, WED, FRI, SAT Takes 5mg -TUES< Raynelle Dick, SUN 09/09/15   Skeet Latch, MD    Family History Family History  Problem Relation Age of Onset  . Stomach cancer Mother   . Hypertension Father     Died of heart attack  . Stroke Sister   . Heart disease Sister   . Cancer Brother   . Colon cancer Neg Hx     Social History Social History  Substance Use Topics  . Smoking status: Former Smoker    Quit date: 01/20/1998  . Smokeless tobacco: Former Systems developer  . Alcohol use No     Allergies   Codeine; Penicillins; and Tramadol   Review of Systems Review of Systems  HENT: Positive for ear discharge and sinus pressure.   Respiratory: Positive for cough.   All other systems reviewed and are negative.    Physical Exam Updated Vital Signs BP 133/75    Pulse 71   Temp 97.6 F (36.4 C) (Oral)   Resp 16   Ht 5' 10.5" (1.791 m)   Wt 209 lb (94.8 kg)   SpO2 93%   BMI 29.56 kg/m   Physical Exam  Constitutional: He is oriented to person, place, and time. He appears well-developed and well-nourished.  HENT:  Head: Normocephalic and atraumatic.  Left Ear: External ear normal.  Nose: Nose normal.  Mouth/Throat: Oropharynx is clear and moist.  Right ear canal filled with blood.  Presumed perforated TM, but I am unable to visualize this.  Eyes: Conjunctivae and EOM are normal. Pupils are  equal, round, and reactive to light.  Neck: Normal range of motion. Neck supple.  Cardiovascular: Normal rate, regular rhythm, normal heart sounds and intact distal pulses.   Pulmonary/Chest: Effort normal and breath sounds normal.  Abdominal: Soft. Bowel sounds are normal.  Musculoskeletal: Normal range of motion.  Left forearm left AVF with good thrill  Neurological: He is alert and oriented to person, place, and time.  Skin: Skin is warm.  Psychiatric: He has a normal mood and affect. His behavior is normal. Judgment and thought content normal.  Nursing note and vitals reviewed.    ED Treatments / Results  Labs (all labs ordered are listed, but only abnormal results are displayed) Labs Reviewed  PROTIME-INR - Abnormal; Notable for the following:       Result Value   Prothrombin Time 34.1 (*)    All other components within normal limits    EKG  EKG Interpretation None       Radiology Dg Chest 2 View  Result Date: 01/17/2016 CLINICAL DATA:  Pt reports cough, SOB, and mid-sternal CP x 2 days; pt reports h/o DM, PNA, and pacemaker insertion; Former smoker EXAM: CHEST  2 VIEW COMPARISON:  Chest CT, 10/05/2015.  Chest radiograph, 10/04/2015. FINDINGS: The cardiac silhouette is top-normal in size. No mediastinal or hilar masses or evidence of adenopathy. Clear lungs.  No pleural effusion or pneumothorax. Right anterior chest wall sequential  pacemaker is stable and well positioned. Skeletal structures are demineralized but intact. IMPRESSION: No acute cardiopulmonary disease. Electronically Signed   By: Lajean Manes M.D.   On: 01/17/2016 15:33    Procedures Procedures (including critical care time)  Medications Ordered in ED Medications  albuterol (PROVENTIL HFA;VENTOLIN HFA) 108 (90 Base) MCG/ACT inhaler 1-2 puff (not administered)  AEROCHAMBER PLUS FLO-VU MEDIUM MISC 1 each (not administered)     Initial Impression / Assessment and Plan / ED Course  I have reviewed the triage vital signs and the nursing notes.  Pertinent labs & imaging results that were available during my care of the patient were reviewed by me and considered in my medical decision making (see chart for details).  Clinical Course     Final Clinical Impressions(s) / ED Diagnoses   Final diagnoses:  Perforation of right tympanic membrane  Upper respiratory tract infection, unspecified type  Acute non-recurrent sinusitis, unspecified location    New Prescriptions New Prescriptions   CEFDINIR (OMNICEF) 300 MG CAPSULE    Take 1 capsule (300 mg total) by mouth every other day. Take after dialysis     Isla Pence, MD 01/17/16 [phone]

## 2016-01-17 NOTE — ED Triage Notes (Signed)
Pt. Stated, I 've had blood coming ot of my rt. Ear and I can't hear from it. This started yesterday.

## 2016-01-17 NOTE — ED Triage Notes (Signed)
Pt here for a week on congestion and yesterday his right ear started bleeding. Pt is on coumadin and had recent procedure done for afib. Pt on dialysis and last one yesterday.

## 2016-01-17 NOTE — ED Notes (Signed)
Patient transported to X-ray 

## 2016-01-19 DIAGNOSIS — H9121 Sudden idiopathic hearing loss, right ear: Secondary | ICD-10-CM | POA: Diagnosis not present

## 2016-01-19 DIAGNOSIS — H9221 Otorrhagia, right ear: Secondary | ICD-10-CM | POA: Diagnosis not present

## 2016-01-19 DIAGNOSIS — Z23 Encounter for immunization: Secondary | ICD-10-CM | POA: Diagnosis not present

## 2016-01-19 DIAGNOSIS — Z7901 Long term (current) use of anticoagulants: Secondary | ICD-10-CM | POA: Insufficient documentation

## 2016-01-19 DIAGNOSIS — N186 End stage renal disease: Secondary | ICD-10-CM | POA: Diagnosis not present

## 2016-01-19 DIAGNOSIS — H6122 Impacted cerumen, left ear: Secondary | ICD-10-CM | POA: Insufficient documentation

## 2016-01-19 DIAGNOSIS — D631 Anemia in chronic kidney disease: Secondary | ICD-10-CM | POA: Diagnosis not present

## 2016-01-22 DIAGNOSIS — D631 Anemia in chronic kidney disease: Secondary | ICD-10-CM | POA: Diagnosis not present

## 2016-01-22 DIAGNOSIS — Z23 Encounter for immunization: Secondary | ICD-10-CM | POA: Diagnosis not present

## 2016-01-22 DIAGNOSIS — E1129 Type 2 diabetes mellitus with other diabetic kidney complication: Secondary | ICD-10-CM | POA: Diagnosis not present

## 2016-01-22 DIAGNOSIS — N186 End stage renal disease: Secondary | ICD-10-CM | POA: Diagnosis not present

## 2016-01-22 DIAGNOSIS — J4521 Mild intermittent asthma with (acute) exacerbation: Secondary | ICD-10-CM | POA: Diagnosis not present

## 2016-01-22 DIAGNOSIS — I482 Chronic atrial fibrillation: Secondary | ICD-10-CM | POA: Diagnosis not present

## 2016-01-23 ENCOUNTER — Ambulatory Visit (INDEPENDENT_AMBULATORY_CARE_PROVIDER_SITE_OTHER): Payer: Medicare Other | Admitting: Pharmacist Clinician (PhC)/ Clinical Pharmacy Specialist

## 2016-01-23 ENCOUNTER — Telehealth: Payer: Self-pay | Admitting: *Deleted

## 2016-01-23 DIAGNOSIS — D631 Anemia in chronic kidney disease: Secondary | ICD-10-CM | POA: Diagnosis not present

## 2016-01-23 DIAGNOSIS — Z23 Encounter for immunization: Secondary | ICD-10-CM | POA: Diagnosis not present

## 2016-01-23 DIAGNOSIS — I480 Paroxysmal atrial fibrillation: Secondary | ICD-10-CM

## 2016-01-23 DIAGNOSIS — Z7901 Long term (current) use of anticoagulants: Secondary | ICD-10-CM

## 2016-01-23 DIAGNOSIS — N186 End stage renal disease: Secondary | ICD-10-CM | POA: Diagnosis not present

## 2016-01-23 LAB — PROTIME-INR
INR: 7.2 — ABNORMAL HIGH
Prothrombin Time: 72.3 s — ABNORMAL HIGH (ref 9.0–11.5)

## 2016-01-23 NOTE — Telephone Encounter (Signed)
Alert high INR notification from [facility] lab reported at 16:04 today.  Result posted at 13:04  RBV an INR of 7.2 [place] by [place] She is aware I will relay information to pharmD for review and notification to patient.

## 2016-01-23 NOTE — Telephone Encounter (Signed)
See anticoag note

## 2016-01-26 ENCOUNTER — Ambulatory Visit (INDEPENDENT_AMBULATORY_CARE_PROVIDER_SITE_OTHER): Payer: Medicare Other | Admitting: Pharmacist Clinician (PhC)/ Clinical Pharmacy Specialist

## 2016-01-26 DIAGNOSIS — N186 End stage renal disease: Secondary | ICD-10-CM | POA: Diagnosis not present

## 2016-01-26 DIAGNOSIS — D631 Anemia in chronic kidney disease: Secondary | ICD-10-CM | POA: Diagnosis not present

## 2016-01-26 DIAGNOSIS — Z7901 Long term (current) use of anticoagulants: Secondary | ICD-10-CM | POA: Diagnosis not present

## 2016-01-26 DIAGNOSIS — I480 Paroxysmal atrial fibrillation: Secondary | ICD-10-CM | POA: Diagnosis not present

## 2016-01-26 DIAGNOSIS — Z23 Encounter for immunization: Secondary | ICD-10-CM | POA: Diagnosis not present

## 2016-01-26 LAB — POCT INR: INR: 3.3

## 2016-01-27 DIAGNOSIS — E1129 Type 2 diabetes mellitus with other diabetic kidney complication: Secondary | ICD-10-CM | POA: Diagnosis not present

## 2016-01-27 DIAGNOSIS — N186 End stage renal disease: Secondary | ICD-10-CM | POA: Diagnosis not present

## 2016-01-27 DIAGNOSIS — Z992 Dependence on renal dialysis: Secondary | ICD-10-CM | POA: Diagnosis not present

## 2016-01-28 DIAGNOSIS — N2581 Secondary hyperparathyroidism of renal origin: Secondary | ICD-10-CM | POA: Diagnosis not present

## 2016-01-28 DIAGNOSIS — N186 End stage renal disease: Secondary | ICD-10-CM | POA: Diagnosis not present

## 2016-01-28 DIAGNOSIS — E1129 Type 2 diabetes mellitus with other diabetic kidney complication: Secondary | ICD-10-CM | POA: Diagnosis not present

## 2016-01-28 DIAGNOSIS — D631 Anemia in chronic kidney disease: Secondary | ICD-10-CM | POA: Diagnosis not present

## 2016-01-30 ENCOUNTER — Encounter: Payer: Self-pay | Admitting: Gastroenterology

## 2016-01-30 DIAGNOSIS — N2581 Secondary hyperparathyroidism of renal origin: Secondary | ICD-10-CM | POA: Diagnosis not present

## 2016-01-30 DIAGNOSIS — D631 Anemia in chronic kidney disease: Secondary | ICD-10-CM | POA: Diagnosis not present

## 2016-01-30 DIAGNOSIS — N186 End stage renal disease: Secondary | ICD-10-CM | POA: Diagnosis not present

## 2016-01-30 DIAGNOSIS — E1129 Type 2 diabetes mellitus with other diabetic kidney complication: Secondary | ICD-10-CM | POA: Diagnosis not present

## 2016-02-02 ENCOUNTER — Ambulatory Visit (INDEPENDENT_AMBULATORY_CARE_PROVIDER_SITE_OTHER): Payer: Medicare Other | Admitting: Pharmacist Clinician (PhC)/ Clinical Pharmacy Specialist

## 2016-02-02 DIAGNOSIS — I480 Paroxysmal atrial fibrillation: Secondary | ICD-10-CM | POA: Diagnosis not present

## 2016-02-02 DIAGNOSIS — Z7901 Long term (current) use of anticoagulants: Secondary | ICD-10-CM | POA: Diagnosis not present

## 2016-02-02 DIAGNOSIS — N186 End stage renal disease: Secondary | ICD-10-CM | POA: Diagnosis not present

## 2016-02-02 DIAGNOSIS — N2581 Secondary hyperparathyroidism of renal origin: Secondary | ICD-10-CM | POA: Diagnosis not present

## 2016-02-02 DIAGNOSIS — E1129 Type 2 diabetes mellitus with other diabetic kidney complication: Secondary | ICD-10-CM | POA: Diagnosis not present

## 2016-02-02 DIAGNOSIS — D631 Anemia in chronic kidney disease: Secondary | ICD-10-CM | POA: Diagnosis not present

## 2016-02-02 LAB — POCT INR: INR: 3.3

## 2016-02-03 DIAGNOSIS — H6122 Impacted cerumen, left ear: Secondary | ICD-10-CM | POA: Diagnosis not present

## 2016-02-03 DIAGNOSIS — H9121 Sudden idiopathic hearing loss, right ear: Secondary | ICD-10-CM | POA: Diagnosis not present

## 2016-02-04 DIAGNOSIS — E1129 Type 2 diabetes mellitus with other diabetic kidney complication: Secondary | ICD-10-CM | POA: Diagnosis not present

## 2016-02-04 DIAGNOSIS — D631 Anemia in chronic kidney disease: Secondary | ICD-10-CM | POA: Diagnosis not present

## 2016-02-04 DIAGNOSIS — N2581 Secondary hyperparathyroidism of renal origin: Secondary | ICD-10-CM | POA: Diagnosis not present

## 2016-02-04 DIAGNOSIS — N186 End stage renal disease: Secondary | ICD-10-CM | POA: Diagnosis not present

## 2016-02-04 NOTE — Progress Notes (Signed)
Electrophysiology Office Note   Date:  02/05/2016   ID:  ELDRID VARNES, DOB 1940-09-11, MRN [id number]  PCP:  Horatio Pel, MD  Cardiologist:  Skeet Latch Primary Electrophysiologist:  Will Meredith [name], MD    Chief Complaint  Patient presents with  . Pacemaker Check    Persistent AFib     History of Present Illness: Alexander [name] is a 75 y.o. male who presents today for electrophysiology evaluation.   He has a history of moderate aortic stenosis, ESRD on HD, diabetes mellitus, hyperlipidemia, carotid artery disease with complete occlusion of the L carotid, prior TIA and atrial fibrillation.  He had a dual chamber pacemaker placed on 03/11/15. At that time, he was feeling weak and dizzy with presyncopal symptoms.   Today, he denies symptoms of palpitations, chest pain, shortness of breath, orthopnea, PND, lower extremity edema, claudication bleeding, or neurologic sequela. He has been feeling significant fatigue and shortness of breath. He had a repeat cardioversion, but unfortunately went back into atrial fibrillation at the same day. He is remained in atrial fibrillation since that time. He says that at times, he does notice that his heart rate is high, but in general he feels well without major symptoms of fatigue, although there is some underlying episodes of fatigue.  Past Medical History:  Diagnosis Date  . Anemia   . Aortic stenosis 06/15/12   TEE - EF 0000000; grade 1 diastolic dysfunction; mild/mod aortic valve stenosis; Mitral valve had calcified annulus, mild pulm htn PA peak pressure 59mmHg  . Barrett's esophagus 05/2003  . Bradycardia   . Carpal tunnel syndrome, bilateral 11/03/2015  . Colon polyps   . CVA (cerebral infarction)    2004/affected left side  . Depression   . Diabetes mellitus without complication ([place])   . Diabetic peripheral neuropathy ([place]) 10/02/2015  . Diverticulosis   . End stage renal disease ([place])    hemodialysis 3 times a week  .  GERD (gastroesophageal reflux disease)   . Hyperlipidemia   . Hypertension   . Kidney stones   . Macular degeneration    both eyes  . Orthostatic hypotension 09/09/2015  . Paroxysmal atrial fibrillation ([facility])   . Peptic ulcer    bleeding, 1969  . S/P epidural steroid injection    last  injection over 10 years ago  . Seasonal allergies   . Tubular adenoma of colon 07/2001   Past Surgical History:  Procedure Laterality Date  . AV FISTULA PLACEMENT  2009  . BACK SURGERY    . CARDIOVERSION N/A 11/13/2015   Procedure: CARDIOVERSION;  Surgeon: Troy Sine, MD;  Location[facility] ENDOSCOPY;  Service: Cardiovascular;  Laterality: N/A;  . CARDIOVERSION N/A 01/13/2016   Procedure: CARDIOVERSION;  Surgeon: Will Meredith [name], MD;  Location: [place];  Service: Cardiovascular;  Laterality: N/A;  . [place]   right eye  . CYSTOSCOPY  several times   kidney stones  . EP IMPLANTABLE DEVICE N/A 03/11/2015   Procedure: Pacemaker Implant;  Surgeon: Will Meredith [name], MD;  Location: [facility] CV LAB;  Service: Cardiovascular;  Laterality: N/A;  . LAMINECTOMY  1969  . TONSILLECTOMY  1964     Current Outpatient Prescriptions  Medication Sig Dispense Refill  . acetaminophen (TYLENOL) 500 MG tablet Take 1,000 mg by mouth every 6 (six) hours as needed for pain.    [name] amiodarone (PACERONE) 200 MG tablet TAKE 1 TABLET BY MOUTH DAILY 90 tablet 3  . atorvastatin (LIPITOR) 40  MG tablet Take 20 mg by mouth daily.    [name] b complex-vitamin c-folic acid (NEPHRO-VITE) 0.8 MG TABS Take 1 tablet by mouth daily.     . bromocriptine (PARLODEL) 5 MG capsule Take 5 mg by mouth at bedtime.     . calcium acetate (PHOSLO) 667 MG capsule Take 667 mg by mouth 4 (four) times daily.     . cefdinir (OMNICEF) 300 MG capsule Take 1 capsule (300 mg total) by mouth every other day. Take after dialysis 4 capsule 0  . cetirizine (ZYRTEC) 10 MG tablet Take 10 mg by mouth at bedtime.     . cinacalcet (SENSIPAR)  30 MG tablet Take 30 mg by mouth daily.     . dorzolamide-timolol (COSOPT) 22.3-6.8 MG/ML ophthalmic solution Place 1 drop into the right eye 2 (two) times daily.    [name] FLUoxetine (PROZAC) 20 MG capsule Take 40 mg by mouth daily.     . midodrine (PROAMATINE) 5 MG tablet Take 3 tablets (15 mg total) by mouth 3 (three) times daily as needed (for systolic blood pressure less than 130). (Patient taking differently: Take 5 mg by mouth 3 (three) times daily as needed (for systolic blood pressure less than 130). ) 270 tablet 3  . omeprazole (PRILOSEC) 20 MG capsule Take 20 mg by mouth daily.    [name] warfarin (COUMADIN) 5 MG tablet TAKE BY MOUTH DAILY AS DIRECTED BY COUMADIN CLINIC (Patient taking differently: Take 5-7.5 mg by mouth daily at 6 PM. Takes 7.5mg -MON, WED, FRI, SAT Takes 5mg -TUES< THURS, SUN) 90 tablet 3   No current facility-administered medications for this visit.     Allergies:   Codeine; Penicillins; and Tramadol   Social History:  The patient  reports that he quit smoking about 18 years ago. He has quit using smokeless tobacco. He reports that he does not drink alcohol or use drugs.   Family History:  The patient's family history includes Cancer in his brother; Heart disease in his sister; Hypertension in his father; Stomach cancer in his mother; Stroke in his sister.    ROS:  Please see the history of present illness.   Otherwise, review of systems is positive for hearing loss, cough, DOE, snoring, wheezing.   All other systems are reviewed and negative.    PHYSICAL EXAM: VS:  BP 134/82   Pulse 84   Ht 5' 10.5" (1.791 m)   Wt 209 lb 12.8 oz (95.2 kg)   BMI 29.68 kg/m  , BMI Body mass index is 29.68 kg/m. GEN: Well nourished, well developed, in no acute distress  HEENT: normal  Neck: no JVD, carotid bruits, or masses Cardiac: RRR, irregular; no murmurs, rubs, or gallops,no edema  Respiratory:  clear to auscultation bilaterally, normal work of breathing GI: soft, nontender,  nondistended, + BS MS: no deformity or atrophy  Skin: warm and dry, fistula left forearm, device pocket C/D/I Neuro:  Strength and sensation are intact Psych: euthymic mood, full affect  EKG:  EKG is ordered today. Personal review of the ECG shows atrial fibrillation, rate 84  Device interrogation performed today, results in [facility].   Recent Labs: 10/04/2015: ALT 20 01/02/2016: BUN 15; Creat 3.61; Platelets 257 01/13/2016: Hemoglobin 10.5; Potassium 5.6; Sodium 138    Lipid Panel     Component Value Date/Time   CHOL 138 10/28/2015 1008   TRIG 158 (H) 10/28/2015 1008   HDL 30 (L) 10/28/2015 1008   CHOLHDL 4.6 10/28/2015 1008   VLDL 32 (H) 10/28/2015 1008  LDLCALC 76 10/28/2015 1008     Wt Readings from Last 3 Encounters:  02/05/16 209 lb 12.8 oz (95.2 kg)  01/17/16 209 lb (94.8 kg)  01/13/16 209 lb (94.8 kg)      11/28/14 TTE: Study Conclusions  - Left ventricle: The cavity size was normal. Systolic function was normal. The estimated ejection fraction was in the range of 60% to 65%. Wall motion was normal; there were no regional wall motion abnormalities. Features are consistent with a pseudonormal left ventricular filling pattern, with concomitant abnormal relaxation and increased filling pressure (grade 2 diastolic dysfunction). Doppler parameters are consistent with high ventricular filling pressure. - Aortic valve: Severe diffuse thickening and calcification. There was moderate stenosis. VTI ratio of LVOT to aortic valve: 0.31. Valve area (VTI): 1.27 cm^2. Indexed valve area (VTI): 0.59 cm^2/m^2. Mean velocity ratio of LVOT to aortic valve: 0.31. Valve area (Vmean): 1.29 cm^2. Indexed valve area (Vmean): 0.6 cm^2/m^2.  Mean gradient (S): 22 mm Hg. - Mitral valve: Severely calcified annulus. There was mild regurgitation. - Left atrium: The atrium was mildly dilated. - Right ventricle: The cavity size was mildly dilated. Wall thickness  was normal. - Tricuspid valve: There was trivial regurgitation. - Pulmonic valve: There was trivial regurgitation. - Pulmonary arteries: PA peak pressure: 37 mm Hg (S).   ASSESSMENT AND PLAN:  1.  Atrial fibrillation: on warfarin.  Has CHADS2VASc of 4.  Currently taking warfarin. He is back in atrial fibrillation today. Due to that, we'll start him on metoprolol and stop his amiodarone with a rate control strategy.  2. Presyncope: Dual-chamber pacemaker placed in December. Functioning appropriately.   Current medicines are reviewed at length with the patient today.   The patient has concerns regarding his medicines.  The following changes were made today:  Stop amiodarone, start metoprolol  Labs/ tests ordered today include:   No orders of the defined types were placed in this encounter.    Disposition:   FU with Will Meredith [name] 6  months  Signed, Will Meredith [name], MD  02/05/2016 12:29 PM     [facility] [address] [place] [address] [phone] (office) [phone] (fax)

## 2016-02-05 ENCOUNTER — Other Ambulatory Visit: Payer: Self-pay | Admitting: Cardiology

## 2016-02-05 ENCOUNTER — Encounter: Payer: Self-pay | Admitting: Cardiology

## 2016-02-05 ENCOUNTER — Ambulatory Visit (INDEPENDENT_AMBULATORY_CARE_PROVIDER_SITE_OTHER): Payer: Medicare Other | Admitting: Cardiology

## 2016-02-05 VITALS — BP 134/82 | HR 84 | Ht 70.5 in | Wt 209.8 lb

## 2016-02-05 DIAGNOSIS — I481 Persistent atrial fibrillation: Secondary | ICD-10-CM

## 2016-02-05 DIAGNOSIS — Z950 Presence of cardiac pacemaker: Secondary | ICD-10-CM

## 2016-02-05 DIAGNOSIS — I4819 Other persistent atrial fibrillation: Secondary | ICD-10-CM

## 2016-02-05 LAB — CUP PACEART INCLINIC DEVICE CHECK
Battery Remaining Longevity: 117 mo
Battery Voltage: 3.01 V
Brady Statistic RA Percent Paced: 14 %
Brady Statistic RV Percent Paced: 6.6 %
Date Time Interrogation Session: 20171109174033
Implantable Lead Implant Date: 20161213
Implantable Lead Location: 753860
Lead Channel Impedance Value: 387.5 Ohm
Lead Channel Pacing Threshold Amplitude: 0.75 V
Lead Channel Pacing Threshold Pulse Width: 0.5 ms
Lead Channel Sensing Intrinsic Amplitude: 1 mV
Lead Channel Setting Pacing Amplitude: 2.5 V
Lead Channel Setting Pacing Pulse Width: 0.5 ms
MDC IDC LEAD IMPLANT DT: 20161213
MDC IDC LEAD LOCATION: 753859
MDC IDC MSMT LEADCHNL RV IMPEDANCE VALUE: 437.5 Ohm
MDC IDC MSMT LEADCHNL RV PACING THRESHOLD AMPLITUDE: 1 V
MDC IDC MSMT LEADCHNL RV PACING THRESHOLD PULSEWIDTH: 0.5 ms
MDC IDC MSMT LEADCHNL RV SENSING INTR AMPL: 9.5 mV
MDC IDC PG IMPLANT DT: 20161213
MDC IDC PG SERIAL: 7839215
MDC IDC SET LEADCHNL RA PACING AMPLITUDE: 1.75 V
MDC IDC SET LEADCHNL RV SENSING SENSITIVITY: 2 mV

## 2016-02-05 MED ORDER — METOPROLOL TARTRATE 25 MG PO TABS: 25.0000 mg | ORAL_TABLET | Freq: Two times a day (BID) | ORAL | 6 refills | Status: DC

## 2016-02-05 NOTE — Patient Instructions (Addendum)
Medication Instructions:    Your physician has recommended you make the following change in your medication:   1) STOP Amiodarone  2) START Lopressor 25 mg twice a day  --- If you need a refill on your cardiac medications before your next appointment, please call your pharmacy. ---  Labwork:  None ordered  Testing/Procedures:  None ordered  Follow-Up:  Your physician wants you to follow-up in:6 months with Dr. Curt Bears.  You will receive a reminder letter in the mail two months in advance. If you don't receive a letter, please call our office to schedule the follow-up appointment.  Thank you for choosing [facility]!!   Trinidad Curet, RN [phone]

## 2016-02-06 DIAGNOSIS — N186 End stage renal disease: Secondary | ICD-10-CM | POA: Diagnosis not present

## 2016-02-06 DIAGNOSIS — E1129 Type 2 diabetes mellitus with other diabetic kidney complication: Secondary | ICD-10-CM | POA: Diagnosis not present

## 2016-02-06 DIAGNOSIS — D631 Anemia in chronic kidney disease: Secondary | ICD-10-CM | POA: Diagnosis not present

## 2016-02-06 DIAGNOSIS — N2581 Secondary hyperparathyroidism of renal origin: Secondary | ICD-10-CM | POA: Diagnosis not present

## 2016-02-09 DIAGNOSIS — D631 Anemia in chronic kidney disease: Secondary | ICD-10-CM | POA: Diagnosis not present

## 2016-02-09 DIAGNOSIS — N186 End stage renal disease: Secondary | ICD-10-CM | POA: Diagnosis not present

## 2016-02-09 DIAGNOSIS — N2581 Secondary hyperparathyroidism of renal origin: Secondary | ICD-10-CM | POA: Diagnosis not present

## 2016-02-09 DIAGNOSIS — E1129 Type 2 diabetes mellitus with other diabetic kidney complication: Secondary | ICD-10-CM | POA: Diagnosis not present

## 2016-02-11 ENCOUNTER — Ambulatory Visit (INDEPENDENT_AMBULATORY_CARE_PROVIDER_SITE_OTHER): Payer: Medicare Other | Admitting: Pharmacist

## 2016-02-11 DIAGNOSIS — I480 Paroxysmal atrial fibrillation: Secondary | ICD-10-CM | POA: Diagnosis not present

## 2016-02-11 DIAGNOSIS — Z7901 Long term (current) use of anticoagulants: Secondary | ICD-10-CM | POA: Diagnosis not present

## 2016-02-11 DIAGNOSIS — N2581 Secondary hyperparathyroidism of renal origin: Secondary | ICD-10-CM | POA: Diagnosis not present

## 2016-02-11 DIAGNOSIS — E1129 Type 2 diabetes mellitus with other diabetic kidney complication: Secondary | ICD-10-CM | POA: Diagnosis not present

## 2016-02-11 DIAGNOSIS — D631 Anemia in chronic kidney disease: Secondary | ICD-10-CM | POA: Diagnosis not present

## 2016-02-11 DIAGNOSIS — N186 End stage renal disease: Secondary | ICD-10-CM | POA: Diagnosis not present

## 2016-02-11 LAB — POCT INR: INR: 4.7

## 2016-02-13 DIAGNOSIS — E1129 Type 2 diabetes mellitus with other diabetic kidney complication: Secondary | ICD-10-CM | POA: Diagnosis not present

## 2016-02-13 DIAGNOSIS — D631 Anemia in chronic kidney disease: Secondary | ICD-10-CM | POA: Diagnosis not present

## 2016-02-13 DIAGNOSIS — N186 End stage renal disease: Secondary | ICD-10-CM | POA: Diagnosis not present

## 2016-02-13 DIAGNOSIS — N2581 Secondary hyperparathyroidism of renal origin: Secondary | ICD-10-CM | POA: Diagnosis not present

## 2016-02-15 DIAGNOSIS — D631 Anemia in chronic kidney disease: Secondary | ICD-10-CM | POA: Diagnosis not present

## 2016-02-15 DIAGNOSIS — N186 End stage renal disease: Secondary | ICD-10-CM | POA: Diagnosis not present

## 2016-02-15 DIAGNOSIS — N2581 Secondary hyperparathyroidism of renal origin: Secondary | ICD-10-CM | POA: Diagnosis not present

## 2016-02-15 DIAGNOSIS — E1129 Type 2 diabetes mellitus with other diabetic kidney complication: Secondary | ICD-10-CM | POA: Diagnosis not present

## 2016-02-17 DIAGNOSIS — N2581 Secondary hyperparathyroidism of renal origin: Secondary | ICD-10-CM | POA: Diagnosis not present

## 2016-02-17 DIAGNOSIS — I482 Chronic atrial fibrillation: Secondary | ICD-10-CM | POA: Diagnosis not present

## 2016-02-17 DIAGNOSIS — D631 Anemia in chronic kidney disease: Secondary | ICD-10-CM | POA: Diagnosis not present

## 2016-02-17 DIAGNOSIS — E1129 Type 2 diabetes mellitus with other diabetic kidney complication: Secondary | ICD-10-CM | POA: Diagnosis not present

## 2016-02-17 DIAGNOSIS — N186 End stage renal disease: Secondary | ICD-10-CM | POA: Diagnosis not present

## 2016-02-17 LAB — PROTIME-INR

## 2016-02-18 ENCOUNTER — Telehealth: Payer: Self-pay | Admitting: Pharmacist

## 2016-02-18 NOTE — Telephone Encounter (Signed)
Spoke to pt wife and instructed to hold today's warfarin dose. We received INR from dialysis yesterday and INR was still elevated at 3.78. Hold today then resume usual dose 5mg  daily except 7.5mg  each Friday and Monday. He is scheduled to come in for INR check on Monday 02/23/16.   She states understanding and appreciation for call.

## 2016-02-20 DIAGNOSIS — D631 Anemia in chronic kidney disease: Secondary | ICD-10-CM | POA: Diagnosis not present

## 2016-02-20 DIAGNOSIS — E1129 Type 2 diabetes mellitus with other diabetic kidney complication: Secondary | ICD-10-CM | POA: Diagnosis not present

## 2016-02-20 DIAGNOSIS — N186 End stage renal disease: Secondary | ICD-10-CM | POA: Diagnosis not present

## 2016-02-20 DIAGNOSIS — N2581 Secondary hyperparathyroidism of renal origin: Secondary | ICD-10-CM | POA: Diagnosis not present

## 2016-02-23 ENCOUNTER — Ambulatory Visit (INDEPENDENT_AMBULATORY_CARE_PROVIDER_SITE_OTHER): Payer: Medicare Other | Admitting: Pharmacist Clinician (PhC)/ Clinical Pharmacy Specialist

## 2016-02-23 DIAGNOSIS — Z7901 Long term (current) use of anticoagulants: Secondary | ICD-10-CM | POA: Diagnosis not present

## 2016-02-23 DIAGNOSIS — N2581 Secondary hyperparathyroidism of renal origin: Secondary | ICD-10-CM | POA: Diagnosis not present

## 2016-02-23 DIAGNOSIS — I480 Paroxysmal atrial fibrillation: Secondary | ICD-10-CM | POA: Diagnosis not present

## 2016-02-23 DIAGNOSIS — E1129 Type 2 diabetes mellitus with other diabetic kidney complication: Secondary | ICD-10-CM | POA: Diagnosis not present

## 2016-02-23 DIAGNOSIS — D631 Anemia in chronic kidney disease: Secondary | ICD-10-CM | POA: Diagnosis not present

## 2016-02-23 DIAGNOSIS — N186 End stage renal disease: Secondary | ICD-10-CM | POA: Diagnosis not present

## 2016-02-23 LAB — POCT INR: INR: 1.7

## 2016-02-25 DIAGNOSIS — E1129 Type 2 diabetes mellitus with other diabetic kidney complication: Secondary | ICD-10-CM | POA: Diagnosis not present

## 2016-02-25 DIAGNOSIS — D631 Anemia in chronic kidney disease: Secondary | ICD-10-CM | POA: Diagnosis not present

## 2016-02-25 DIAGNOSIS — N2581 Secondary hyperparathyroidism of renal origin: Secondary | ICD-10-CM | POA: Diagnosis not present

## 2016-02-25 DIAGNOSIS — N186 End stage renal disease: Secondary | ICD-10-CM | POA: Diagnosis not present

## 2016-02-26 DIAGNOSIS — E1129 Type 2 diabetes mellitus with other diabetic kidney complication: Secondary | ICD-10-CM | POA: Diagnosis not present

## 2016-02-26 DIAGNOSIS — N186 End stage renal disease: Secondary | ICD-10-CM | POA: Diagnosis not present

## 2016-02-26 DIAGNOSIS — Z992 Dependence on renal dialysis: Secondary | ICD-10-CM | POA: Diagnosis not present

## 2016-02-27 DIAGNOSIS — E1129 Type 2 diabetes mellitus with other diabetic kidney complication: Secondary | ICD-10-CM | POA: Diagnosis not present

## 2016-02-27 DIAGNOSIS — N186 End stage renal disease: Secondary | ICD-10-CM | POA: Diagnosis not present

## 2016-02-27 DIAGNOSIS — N2581 Secondary hyperparathyroidism of renal origin: Secondary | ICD-10-CM | POA: Diagnosis not present

## 2016-03-01 ENCOUNTER — Ambulatory Visit (INDEPENDENT_AMBULATORY_CARE_PROVIDER_SITE_OTHER): Payer: Medicare Other | Admitting: Pharmacist Clinician (PhC)/ Clinical Pharmacy Specialist

## 2016-03-01 DIAGNOSIS — N186 End stage renal disease: Secondary | ICD-10-CM | POA: Diagnosis not present

## 2016-03-01 DIAGNOSIS — I480 Paroxysmal atrial fibrillation: Secondary | ICD-10-CM

## 2016-03-01 DIAGNOSIS — E1129 Type 2 diabetes mellitus with other diabetic kidney complication: Secondary | ICD-10-CM | POA: Diagnosis not present

## 2016-03-01 DIAGNOSIS — Z7901 Long term (current) use of anticoagulants: Secondary | ICD-10-CM | POA: Diagnosis not present

## 2016-03-01 DIAGNOSIS — N2581 Secondary hyperparathyroidism of renal origin: Secondary | ICD-10-CM | POA: Diagnosis not present

## 2016-03-01 LAB — POCT INR: INR: 3.5

## 2016-03-03 ENCOUNTER — Other Ambulatory Visit: Payer: Self-pay | Admitting: *Deleted

## 2016-03-03 DIAGNOSIS — N2581 Secondary hyperparathyroidism of renal origin: Secondary | ICD-10-CM | POA: Diagnosis not present

## 2016-03-03 DIAGNOSIS — E1129 Type 2 diabetes mellitus with other diabetic kidney complication: Secondary | ICD-10-CM | POA: Diagnosis not present

## 2016-03-03 DIAGNOSIS — N186 End stage renal disease: Secondary | ICD-10-CM | POA: Diagnosis not present

## 2016-03-03 MED ORDER — WARFARIN SODIUM 5 MG PO TABS: ORAL_TABLET | ORAL | 1 refills | Status: DC

## 2016-03-05 DIAGNOSIS — E1129 Type 2 diabetes mellitus with other diabetic kidney complication: Secondary | ICD-10-CM | POA: Diagnosis not present

## 2016-03-05 DIAGNOSIS — N2581 Secondary hyperparathyroidism of renal origin: Secondary | ICD-10-CM | POA: Diagnosis not present

## 2016-03-05 DIAGNOSIS — N186 End stage renal disease: Secondary | ICD-10-CM | POA: Diagnosis not present

## 2016-03-08 ENCOUNTER — Ambulatory Visit (INDEPENDENT_AMBULATORY_CARE_PROVIDER_SITE_OTHER): Payer: Medicare Other | Admitting: Pharmacist Clinician (PhC)/ Clinical Pharmacy Specialist

## 2016-03-08 ENCOUNTER — Other Ambulatory Visit: Payer: Self-pay | Admitting: *Deleted

## 2016-03-08 DIAGNOSIS — Z7901 Long term (current) use of anticoagulants: Secondary | ICD-10-CM

## 2016-03-08 DIAGNOSIS — I480 Paroxysmal atrial fibrillation: Secondary | ICD-10-CM | POA: Diagnosis not present

## 2016-03-08 DIAGNOSIS — N186 End stage renal disease: Secondary | ICD-10-CM | POA: Diagnosis not present

## 2016-03-08 DIAGNOSIS — E1129 Type 2 diabetes mellitus with other diabetic kidney complication: Secondary | ICD-10-CM | POA: Diagnosis not present

## 2016-03-08 DIAGNOSIS — N2581 Secondary hyperparathyroidism of renal origin: Secondary | ICD-10-CM | POA: Diagnosis not present

## 2016-03-08 LAB — POCT INR: INR: 3.1

## 2016-03-08 MED ORDER — WARFARIN SODIUM 5 MG PO TABS: ORAL_TABLET | ORAL | 1 refills | Status: DC

## 2016-03-09 DIAGNOSIS — D352 Benign neoplasm of pituitary gland: Secondary | ICD-10-CM | POA: Diagnosis not present

## 2016-03-10 DIAGNOSIS — E1129 Type 2 diabetes mellitus with other diabetic kidney complication: Secondary | ICD-10-CM | POA: Diagnosis not present

## 2016-03-10 DIAGNOSIS — N2581 Secondary hyperparathyroidism of renal origin: Secondary | ICD-10-CM | POA: Diagnosis not present

## 2016-03-10 DIAGNOSIS — N186 End stage renal disease: Secondary | ICD-10-CM | POA: Diagnosis not present

## 2016-03-12 DIAGNOSIS — N2581 Secondary hyperparathyroidism of renal origin: Secondary | ICD-10-CM | POA: Diagnosis not present

## 2016-03-12 DIAGNOSIS — N186 End stage renal disease: Secondary | ICD-10-CM | POA: Diagnosis not present

## 2016-03-12 DIAGNOSIS — E1129 Type 2 diabetes mellitus with other diabetic kidney complication: Secondary | ICD-10-CM | POA: Diagnosis not present

## 2016-03-15 DIAGNOSIS — E1129 Type 2 diabetes mellitus with other diabetic kidney complication: Secondary | ICD-10-CM | POA: Diagnosis not present

## 2016-03-15 DIAGNOSIS — N2581 Secondary hyperparathyroidism of renal origin: Secondary | ICD-10-CM | POA: Diagnosis not present

## 2016-03-15 DIAGNOSIS — N186 End stage renal disease: Secondary | ICD-10-CM | POA: Diagnosis not present

## 2016-03-16 DIAGNOSIS — M1189 Other specified crystal arthropathies, multiple sites: Secondary | ICD-10-CM | POA: Diagnosis not present

## 2016-03-16 DIAGNOSIS — M7582 Other shoulder lesions, left shoulder: Secondary | ICD-10-CM | POA: Diagnosis not present

## 2016-03-16 DIAGNOSIS — N181 Chronic kidney disease, stage 1: Secondary | ICD-10-CM | POA: Diagnosis not present

## 2016-03-16 DIAGNOSIS — N186 End stage renal disease: Secondary | ICD-10-CM | POA: Diagnosis not present

## 2016-03-16 DIAGNOSIS — E237 Disorder of pituitary gland, unspecified: Secondary | ICD-10-CM | POA: Diagnosis not present

## 2016-03-17 DIAGNOSIS — N186 End stage renal disease: Secondary | ICD-10-CM | POA: Diagnosis not present

## 2016-03-17 DIAGNOSIS — N2581 Secondary hyperparathyroidism of renal origin: Secondary | ICD-10-CM | POA: Diagnosis not present

## 2016-03-17 DIAGNOSIS — E1129 Type 2 diabetes mellitus with other diabetic kidney complication: Secondary | ICD-10-CM | POA: Diagnosis not present

## 2016-03-18 DIAGNOSIS — Z992 Dependence on renal dialysis: Secondary | ICD-10-CM | POA: Diagnosis not present

## 2016-03-18 DIAGNOSIS — T82858A Stenosis of vascular prosthetic devices, implants and grafts, initial encounter: Secondary | ICD-10-CM | POA: Diagnosis not present

## 2016-03-18 DIAGNOSIS — I771 Stricture of artery: Secondary | ICD-10-CM | POA: Diagnosis not present

## 2016-03-18 DIAGNOSIS — N186 End stage renal disease: Secondary | ICD-10-CM | POA: Diagnosis not present

## 2016-03-19 DIAGNOSIS — N2581 Secondary hyperparathyroidism of renal origin: Secondary | ICD-10-CM | POA: Diagnosis not present

## 2016-03-19 DIAGNOSIS — E1129 Type 2 diabetes mellitus with other diabetic kidney complication: Secondary | ICD-10-CM | POA: Diagnosis not present

## 2016-03-19 DIAGNOSIS — N186 End stage renal disease: Secondary | ICD-10-CM | POA: Diagnosis not present

## 2016-03-21 DIAGNOSIS — E1129 Type 2 diabetes mellitus with other diabetic kidney complication: Secondary | ICD-10-CM | POA: Diagnosis not present

## 2016-03-21 DIAGNOSIS — N186 End stage renal disease: Secondary | ICD-10-CM | POA: Diagnosis not present

## 2016-03-21 DIAGNOSIS — N2581 Secondary hyperparathyroidism of renal origin: Secondary | ICD-10-CM | POA: Diagnosis not present

## 2016-03-24 DIAGNOSIS — N2581 Secondary hyperparathyroidism of renal origin: Secondary | ICD-10-CM | POA: Diagnosis not present

## 2016-03-24 DIAGNOSIS — E1129 Type 2 diabetes mellitus with other diabetic kidney complication: Secondary | ICD-10-CM | POA: Diagnosis not present

## 2016-03-24 DIAGNOSIS — N186 End stage renal disease: Secondary | ICD-10-CM | POA: Diagnosis not present

## 2016-03-24 DIAGNOSIS — I482 Chronic atrial fibrillation: Secondary | ICD-10-CM | POA: Diagnosis not present

## 2016-03-24 LAB — PROTIME-INR: INR: 4 — AB (ref <=1.1)

## 2016-03-25 LAB — PROTIME-INR

## 2016-03-26 ENCOUNTER — Telehealth: Payer: Self-pay | Admitting: Cardiology

## 2016-03-26 ENCOUNTER — Ambulatory Visit (INDEPENDENT_AMBULATORY_CARE_PROVIDER_SITE_OTHER): Payer: Medicare Other | Admitting: Pharmacist

## 2016-03-26 DIAGNOSIS — I480 Paroxysmal atrial fibrillation: Secondary | ICD-10-CM

## 2016-03-26 DIAGNOSIS — N2581 Secondary hyperparathyroidism of renal origin: Secondary | ICD-10-CM | POA: Diagnosis not present

## 2016-03-26 DIAGNOSIS — Z7901 Long term (current) use of anticoagulants: Secondary | ICD-10-CM

## 2016-03-26 DIAGNOSIS — N186 End stage renal disease: Secondary | ICD-10-CM | POA: Diagnosis not present

## 2016-03-26 NOTE — Telephone Encounter (Signed)
See anticoag note from 03/26/16

## 2016-03-26 NOTE — Telephone Encounter (Signed)
INR alert from patient's dialysis center [facility]. They note the result was from Wednesday of this week, they just received notification today on this. Patient is out of town at [facility] but may be reached on cell.  INR was 3.97.  He has been followed recently in [facility] coumadin clinic. Routed for review and adjustment to coumadin dosing by clinical pharmacist.

## 2016-03-28 DIAGNOSIS — Z992 Dependence on renal dialysis: Secondary | ICD-10-CM | POA: Diagnosis not present

## 2016-03-28 DIAGNOSIS — E1129 Type 2 diabetes mellitus with other diabetic kidney complication: Secondary | ICD-10-CM | POA: Diagnosis not present

## 2016-03-28 DIAGNOSIS — N2581 Secondary hyperparathyroidism of renal origin: Secondary | ICD-10-CM | POA: Diagnosis not present

## 2016-03-28 DIAGNOSIS — N186 End stage renal disease: Secondary | ICD-10-CM | POA: Diagnosis not present

## 2016-03-31 DIAGNOSIS — N186 End stage renal disease: Secondary | ICD-10-CM | POA: Diagnosis not present

## 2016-03-31 DIAGNOSIS — E1129 Type 2 diabetes mellitus with other diabetic kidney complication: Secondary | ICD-10-CM | POA: Diagnosis not present

## 2016-03-31 DIAGNOSIS — N2581 Secondary hyperparathyroidism of renal origin: Secondary | ICD-10-CM | POA: Diagnosis not present

## 2016-04-02 ENCOUNTER — Ambulatory Visit (INDEPENDENT_AMBULATORY_CARE_PROVIDER_SITE_OTHER): Payer: Medicare Other | Admitting: Pharmacist Clinician (PhC)/ Clinical Pharmacy Specialist

## 2016-04-02 DIAGNOSIS — N2581 Secondary hyperparathyroidism of renal origin: Secondary | ICD-10-CM | POA: Diagnosis not present

## 2016-04-02 DIAGNOSIS — E1129 Type 2 diabetes mellitus with other diabetic kidney complication: Secondary | ICD-10-CM | POA: Diagnosis not present

## 2016-04-02 DIAGNOSIS — Z7901 Long term (current) use of anticoagulants: Secondary | ICD-10-CM | POA: Diagnosis not present

## 2016-04-02 DIAGNOSIS — I480 Paroxysmal atrial fibrillation: Secondary | ICD-10-CM | POA: Diagnosis not present

## 2016-04-02 DIAGNOSIS — N186 End stage renal disease: Secondary | ICD-10-CM | POA: Diagnosis not present

## 2016-04-02 LAB — POCT INR: INR: 3.4

## 2016-04-05 DIAGNOSIS — N186 End stage renal disease: Secondary | ICD-10-CM | POA: Diagnosis not present

## 2016-04-05 DIAGNOSIS — E1129 Type 2 diabetes mellitus with other diabetic kidney complication: Secondary | ICD-10-CM | POA: Diagnosis not present

## 2016-04-05 DIAGNOSIS — N2581 Secondary hyperparathyroidism of renal origin: Secondary | ICD-10-CM | POA: Diagnosis not present

## 2016-04-07 DIAGNOSIS — N186 End stage renal disease: Secondary | ICD-10-CM | POA: Diagnosis not present

## 2016-04-07 DIAGNOSIS — N2581 Secondary hyperparathyroidism of renal origin: Secondary | ICD-10-CM | POA: Diagnosis not present

## 2016-04-07 DIAGNOSIS — E1129 Type 2 diabetes mellitus with other diabetic kidney complication: Secondary | ICD-10-CM | POA: Diagnosis not present

## 2016-04-09 DIAGNOSIS — E1129 Type 2 diabetes mellitus with other diabetic kidney complication: Secondary | ICD-10-CM | POA: Diagnosis not present

## 2016-04-09 DIAGNOSIS — N2581 Secondary hyperparathyroidism of renal origin: Secondary | ICD-10-CM | POA: Diagnosis not present

## 2016-04-09 DIAGNOSIS — N186 End stage renal disease: Secondary | ICD-10-CM | POA: Diagnosis not present

## 2016-04-12 DIAGNOSIS — E1129 Type 2 diabetes mellitus with other diabetic kidney complication: Secondary | ICD-10-CM | POA: Diagnosis not present

## 2016-04-12 DIAGNOSIS — N2581 Secondary hyperparathyroidism of renal origin: Secondary | ICD-10-CM | POA: Diagnosis not present

## 2016-04-12 DIAGNOSIS — N186 End stage renal disease: Secondary | ICD-10-CM | POA: Diagnosis not present

## 2016-04-13 DIAGNOSIS — H401133 Primary open-angle glaucoma, bilateral, severe stage: Secondary | ICD-10-CM | POA: Diagnosis not present

## 2016-04-14 DIAGNOSIS — N186 End stage renal disease: Secondary | ICD-10-CM | POA: Diagnosis not present

## 2016-04-14 DIAGNOSIS — E1129 Type 2 diabetes mellitus with other diabetic kidney complication: Secondary | ICD-10-CM | POA: Diagnosis not present

## 2016-04-14 DIAGNOSIS — N2581 Secondary hyperparathyroidism of renal origin: Secondary | ICD-10-CM | POA: Diagnosis not present

## 2016-04-16 ENCOUNTER — Ambulatory Visit (INDEPENDENT_AMBULATORY_CARE_PROVIDER_SITE_OTHER): Payer: Medicare Other | Admitting: Pharmacist

## 2016-04-16 DIAGNOSIS — N2581 Secondary hyperparathyroidism of renal origin: Secondary | ICD-10-CM | POA: Diagnosis not present

## 2016-04-16 DIAGNOSIS — Z7901 Long term (current) use of anticoagulants: Secondary | ICD-10-CM

## 2016-04-16 DIAGNOSIS — I480 Paroxysmal atrial fibrillation: Secondary | ICD-10-CM | POA: Diagnosis not present

## 2016-04-16 DIAGNOSIS — E1129 Type 2 diabetes mellitus with other diabetic kidney complication: Secondary | ICD-10-CM | POA: Diagnosis not present

## 2016-04-16 DIAGNOSIS — N186 End stage renal disease: Secondary | ICD-10-CM | POA: Diagnosis not present

## 2016-04-16 LAB — POCT INR: INR: 3.1

## 2016-04-19 DIAGNOSIS — N2581 Secondary hyperparathyroidism of renal origin: Secondary | ICD-10-CM | POA: Diagnosis not present

## 2016-04-19 DIAGNOSIS — N186 End stage renal disease: Secondary | ICD-10-CM | POA: Diagnosis not present

## 2016-04-19 DIAGNOSIS — E1129 Type 2 diabetes mellitus with other diabetic kidney complication: Secondary | ICD-10-CM | POA: Diagnosis not present

## 2016-04-20 DIAGNOSIS — N390 Urinary tract infection, site not specified: Secondary | ICD-10-CM | POA: Diagnosis not present

## 2016-04-20 DIAGNOSIS — Z0000 Encounter for general adult medical examination without abnormal findings: Secondary | ICD-10-CM | POA: Diagnosis not present

## 2016-04-20 DIAGNOSIS — E119 Type 2 diabetes mellitus without complications: Secondary | ICD-10-CM | POA: Diagnosis not present

## 2016-04-20 DIAGNOSIS — Z125 Encounter for screening for malignant neoplasm of prostate: Secondary | ICD-10-CM | POA: Diagnosis not present

## 2016-04-20 DIAGNOSIS — Z7982 Long term (current) use of aspirin: Secondary | ICD-10-CM | POA: Diagnosis not present

## 2016-04-20 DIAGNOSIS — D649 Anemia, unspecified: Secondary | ICD-10-CM | POA: Diagnosis not present

## 2016-04-20 DIAGNOSIS — D509 Iron deficiency anemia, unspecified: Secondary | ICD-10-CM | POA: Diagnosis not present

## 2016-04-20 DIAGNOSIS — I10 Essential (primary) hypertension: Secondary | ICD-10-CM | POA: Diagnosis not present

## 2016-04-21 ENCOUNTER — Telehealth: Payer: Self-pay | Admitting: Physician Assistant

## 2016-04-21 DIAGNOSIS — E1129 Type 2 diabetes mellitus with other diabetic kidney complication: Secondary | ICD-10-CM | POA: Diagnosis not present

## 2016-04-21 DIAGNOSIS — I482 Chronic atrial fibrillation: Secondary | ICD-10-CM | POA: Diagnosis not present

## 2016-04-21 DIAGNOSIS — N186 End stage renal disease: Secondary | ICD-10-CM | POA: Diagnosis not present

## 2016-04-21 DIAGNOSIS — N2581 Secondary hyperparathyroidism of renal origin: Secondary | ICD-10-CM | POA: Diagnosis not present

## 2016-04-21 NOTE — Telephone Encounter (Signed)
Received records from [facility] for appointment on 04/30/16 with Rosaria Ferries, PA.  Records put with Rhonda's schedule for 04/30/16. lp

## 2016-04-22 DIAGNOSIS — N2581 Secondary hyperparathyroidism of renal origin: Secondary | ICD-10-CM | POA: Diagnosis not present

## 2016-04-22 DIAGNOSIS — I10 Essential (primary) hypertension: Secondary | ICD-10-CM | POA: Diagnosis not present

## 2016-04-22 DIAGNOSIS — I482 Chronic atrial fibrillation: Secondary | ICD-10-CM | POA: Diagnosis not present

## 2016-04-22 DIAGNOSIS — D638 Anemia in other chronic diseases classified elsewhere: Secondary | ICD-10-CM | POA: Diagnosis not present

## 2016-04-23 DIAGNOSIS — N186 End stage renal disease: Secondary | ICD-10-CM | POA: Diagnosis not present

## 2016-04-23 DIAGNOSIS — N2581 Secondary hyperparathyroidism of renal origin: Secondary | ICD-10-CM | POA: Diagnosis not present

## 2016-04-23 DIAGNOSIS — E1129 Type 2 diabetes mellitus with other diabetic kidney complication: Secondary | ICD-10-CM | POA: Diagnosis not present

## 2016-04-26 ENCOUNTER — Telehealth: Payer: Self-pay | Admitting: Cardiology

## 2016-04-26 DIAGNOSIS — N186 End stage renal disease: Secondary | ICD-10-CM | POA: Diagnosis not present

## 2016-04-26 DIAGNOSIS — E1129 Type 2 diabetes mellitus with other diabetic kidney complication: Secondary | ICD-10-CM | POA: Diagnosis not present

## 2016-04-26 DIAGNOSIS — N2581 Secondary hyperparathyroidism of renal origin: Secondary | ICD-10-CM | POA: Diagnosis not present

## 2016-04-26 NOTE — Telephone Encounter (Signed)
Called, spoke with pt's wife, [name] (on [place]). Pt's wife had med questions. Pt wanted to know if pt was supposed to be taking Amiodarone.Informed last ov note from Dr. Curt Bears stated STOP Amiodarone and START Lopressor 25 mg twice daily. Wife had questions about Midodrine. Reviewed instructions for Midodrine 5 mg tab - Take 3 tablets (15 mg total) by mouth 3 (three) times daily as needed (for systolic blood pressure less than 130). Wife verbalized understanding and thanked me for calling.

## 2016-04-26 NOTE — Telephone Encounter (Signed)
New message       Talk to a nurse to go over patient's medication list.  They are not sure that he is taking his meds correctly

## 2016-04-28 DIAGNOSIS — N186 End stage renal disease: Secondary | ICD-10-CM | POA: Diagnosis not present

## 2016-04-28 DIAGNOSIS — E1129 Type 2 diabetes mellitus with other diabetic kidney complication: Secondary | ICD-10-CM | POA: Diagnosis not present

## 2016-04-28 DIAGNOSIS — Z992 Dependence on renal dialysis: Secondary | ICD-10-CM | POA: Diagnosis not present

## 2016-04-28 DIAGNOSIS — N2581 Secondary hyperparathyroidism of renal origin: Secondary | ICD-10-CM | POA: Diagnosis not present

## 2016-04-29 ENCOUNTER — Ambulatory Visit (INDEPENDENT_AMBULATORY_CARE_PROVIDER_SITE_OTHER): Payer: Medicare Other | Admitting: Physician Assistant

## 2016-04-29 ENCOUNTER — Ambulatory Visit (INDEPENDENT_AMBULATORY_CARE_PROVIDER_SITE_OTHER): Payer: Medicare Other | Admitting: Pharmacist Clinician (PhC)/ Clinical Pharmacy Specialist

## 2016-04-29 ENCOUNTER — Encounter: Payer: Self-pay | Admitting: Physician Assistant

## 2016-04-29 VITALS — BP 119/77 | HR 81 | Ht 70.5 in | Wt 213.0 lb

## 2016-04-29 DIAGNOSIS — I481 Persistent atrial fibrillation: Secondary | ICD-10-CM

## 2016-04-29 DIAGNOSIS — Z7901 Long term (current) use of anticoagulants: Secondary | ICD-10-CM

## 2016-04-29 DIAGNOSIS — I480 Paroxysmal atrial fibrillation: Secondary | ICD-10-CM

## 2016-04-29 DIAGNOSIS — I4819 Other persistent atrial fibrillation: Secondary | ICD-10-CM

## 2016-04-29 LAB — POCT INR: INR: 3

## 2016-04-29 NOTE — Progress Notes (Signed)
Cardiology Office Note   Date:  04/29/2016   ID:  MANDEL COTUGNO, DOB [birth year], MRN [id number]  PCP:  Horatio Pel, MD  Cardiologist:  [name] 10/17/2015  Primary Electrophysiologist:  Constance Haw, MD  02/05/2016 Rosaria Ferries, PA-C   Chief Complaint  Patient presents with  . Shortness of Breath  . Back Pain   History of Present Illness: Alexander [name] is a 76 y.o. male with a history of moderate AS, ESRD on HD, DM, HLD, complete occlusion of the L carotid, prior TIA, atrial fibrillation on Coumadin. History of bradycardia s/p [employer] 2240 Assurity dual-lead pacer,  CHADS2VASc of 4 (age x 2, PAD, DM), neuropathy  02/05/2016 office visit with Dr. Curt Bears, patient back in A. fib, amiodarone discontinued and metoprolol added. Patient is also on midodrine  Alexander [name] presents for cardiology follow up.  He goes in and out of atrial fib. He does not tolerate it well. He feels weak, has increased DOE. He is having trouble walking to the mailbox because of the SOB. He has been spending more time in afib. Because of that, he is not doing much, activity is decreasing.   He is compliant with HD appts tolerates that well as long as he takes the midodrine.  He has chronic back issues, has seen MDs in the past and was getting steroid shots, which helped. He is having a great deal of pain now.   He is tolerating the metoprolol well, but does not take it till after HD on HD days. He feels the atrial fib, but cannot say when or how often his HR is elevated.   Past Medical History:  Diagnosis Date  . Anemia   . Aortic stenosis 06/15/12   TEE - EF 0000000; grade 1 diastolic dysfunction; mild/mod aortic valve stenosis; Mitral valve had calcified annulus, mild pulm htn PA peak pressure 35mmHg  . Barrett's esophagus 05/2003  . Bradycardia 2017   [employer] 2240 Assurity dual-lead pacemaker  . Carpal tunnel syndrome, bilateral 11/03/2015  . Colon polyps     . CVA (cerebral infarction)    2004/affected left side  . Depression   . Diabetes mellitus without complication ([place])   . Diabetic peripheral neuropathy ([place]) 10/02/2015  . Diverticulosis   . End stage renal disease ([place])    hemodialysis 3 times a week  . GERD (gastroesophageal reflux disease)   . Hyperlipidemia   . Hypertension   . Kidney stones   . Macular degeneration    both eyes  . Orthostatic hypotension 09/09/2015  . Paroxysmal atrial fibrillation ([facility])   . Peptic ulcer    bleeding, 1969  . S/P epidural steroid injection    last  injection over 10 years ago  . Seasonal allergies   . Tubular adenoma of colon 07/2001    Past Surgical History:  Procedure Laterality Date  . AV FISTULA PLACEMENT  2009  . BACK SURGERY    . CARDIOVERSION N/A 11/13/2015   Procedure: CARDIOVERSION;  Surgeon: Troy Sine, MD;  Location: [facility] ENDOSCOPY;  Service: Cardiovascular;  Laterality: N/A;  . CARDIOVERSION N/A 01/13/2016   Procedure: CARDIOVERSION;  Surgeon: Will Meredith [name], MD;  Location: [place];  Service: Cardiovascular;  Laterality: N/A;  . [place]   right eye  . CYSTOSCOPY  several times   kidney stones  . EP IMPLANTABLE DEVICE N/A 03/11/2015   Procedure: Pacemaker Implant;  Surgeon: Will Meredith [name], MD;  Francella Solian.  [employer] 2240 [employer]urity;  Laterality: Left  . LAMINECTOMY  1969  . TONSILLECTOMY  1964    Medication Sig  . acetaminophen (TYLENOL) 500 MG tablet Take 1,000 mg by mouth every 6 (six) hours as needed for pain.  [name] atorvastatin (LIPITOR) 40 MG tablet Take 20 mg by mouth daily.  [name] b complex-vitamin c-folic acid (NEPHRO-VITE) 0.8 MG TABS Take 1 tablet by mouth daily.   . bromocriptine (PARLODEL) 5 MG capsule Take 5 mg by mouth at bedtime.   . calcium acetate (PHOSLO) 667 MG capsule Take 667 mg by mouth 4 (four) times daily.   . cefdinir (OMNICEF) 300 MG capsule Take 1 capsule (300 mg total) by mouth every other day. Take after dialysis  .  cetirizine (ZYRTEC) 10 MG tablet Take 10 mg by mouth at bedtime.   . dorzolamide-timolol (COSOPT) 22.3-6.8 MG/ML ophthalmic solution Place 1 drop into the right eye 2 (two) times daily.  [name] FLUoxetine (PROZAC) 20 MG capsule Take 40 mg by mouth daily.   . metoprolol tartrate (LOPRESSOR) 25 MG tablet TAKE 1 TABLET BY MOUTH TWICE DAILY  . midodrine (PROAMATINE) 5 MG tablet Take 3 tablets (15 mg total) by mouth 3 (three) times daily as needed (for systolic blood pressure less than 130). (Patient taking differently: Take 5 mg by mouth 3 (three) times daily as needed (for systolic blood pressure less than 130). )  . omeprazole (PRILOSEC) 20 MG capsule Take 20 mg by mouth daily.  . predniSONE (DELTASONE) 5 MG tablet Take 1 tablet by mouth daily.  . SENSIPAR 30 MG tablet Take 1 tablet by mouth daily.  [name] warfarin (COUMADIN) 5 MG tablet Take 1-1.5 tablets by mouth daily as directed by coumadin clinic   No current facility-administered medications for this visit.     Allergies:   Codeine; Penicillins; and Tramadol    Social History:  The patient  reports that he quit smoking about 18 years ago. He has quit using smokeless tobacco. He reports that he does not drink alcohol or use drugs.   Family History:  The patient's family history includes Cancer in his brother; Heart disease in his sister; Hypertension in his father; Stomach cancer in his mother; Stroke in his sister.    ROS:  Please see the history of present illness. All other systems are reviewed and negative.    PHYSICAL EXAM: VS:  BP 119/77   Pulse 81   Ht 5' 10.5" (1.791 m)   Wt 213 lb (96.6 kg)   BMI 30.13 kg/m  , BMI Body mass index is 30.13 kg/m. GEN: Well nourished, well developed, male in no acute distress  HEENT: normal for age  Neck: no JVD, + carotid bruit, no masses Cardiac: Irreg R&R; 2/6 AS murmur, no rubs, or gallops Respiratory:  clear to auscultation bilaterally, normal work of breathing GI: soft, nontender,  nondistended, + BS MS: no deformity or atrophy; no edema; distal pulses are 2+ in 3/4 extremities. HD graft L forearm w/ thrill Skin: warm and dry, no rash Neuro:  Strength and sensation are intact Psych: euthymic mood, full affect   EKG:  EKG is ordered today. The ekg ordered today demonstrates Atrial fib, V pacing prn, HR 81  ECHO: 11/28/2014 - Left ventricle: The cavity size was normal. Systolic function was   normal. The estimated ejection fraction was in the range of 60%   to 65%. Wall motion was normal; there were no regional wall   motion abnormalities. Features are consistent with  a pseudonormal   left ventricular filling pattern, with concomitant abnormal   relaxation and increased filling pressure (grade 2 diastolic   dysfunction). Doppler parameters are consistent with high   ventricular filling pressure. - Aortic valve: Severe diffuse thickening and calcification. There   was moderate stenosis. - Mitral valve: Severely calcified annulus. There was mild   regurgitation. - Left atrium: The atrium was mildly dilated. - Right atrium:  The atrium was normal in size. - Right ventricle: The cavity size was mildly dilated. Wall   thickness was normal. - Tricuspid valve: There was trivial regurgitation. - Pulmonic valve: There was trivial regurgitation. - Pulmonary arteries: PA peak pressure: 37 mm Hg (S). Impressions: - The right ventricular systolic pressure was increased consistent   with mild pulmonary hypertension.  Recent Labs: 10/04/2015: ALT 20 01/02/2016: BUN 15; Creat 3.61; Platelets 257 01/13/2016: Hemoglobin 10.5; Potassium 5.6; Sodium 138    Lipid Panel    Component Value Date/Time   CHOL 138 10/28/2015 1008   TRIG 158 (H) 10/28/2015 1008   HDL 30 (L) 10/28/2015 1008   CHOLHDL 4.6 10/28/2015 1008   VLDL 32 (H) 10/28/2015 1008   LDLCALC 76 10/28/2015 1008     Wt Readings from Last 3 Encounters:  04/29/16 213 lb (96.6 kg)  02/05/16 209 lb 12.8 oz  (95.2 kg)  01/17/16 209 lb (94.8 kg)     Other studies Reviewed: Additional studies/ records that were reviewed today include: office notes, hospital records and testing.  ASSESSMENT AND PLAN:  1.  Persistent atrial fib: Dr Curt Bears follows him for this and his PPM, will set up EP appt ASAP. He is not tolerating the afib well. Atrial sizes not very abnl on last echo, EF normal, recheck.   2. Chronic anticoag - on coumadin, ck today. Advised him to ask the pharmacist (or I would) if X-cover is needed with Lovenox is needed for procedures.   Current medicines are reviewed at length with the patient today.  The patient does not have concerns regarding medicines.  The following changes have been made:  no change  Labs/ tests ordered today include:  No orders of the defined types were placed in this encounter.    Disposition:   FU with Dr. Curt Bears and [name]  Signed, Barrett, [address]  04/29/2016 10:39 AM    [place] Phone: [phone]; Fax: [phone]  This note was written with the assistance of speech recognition software. Please excuse any transcriptional errors.

## 2016-04-29 NOTE — Patient Instructions (Signed)
Medication Instructions:  Continue current medications  Labwork: None Ordered  Testing/Procedures: None ordered  Follow-Up: Your physician recommends that you schedule a follow-up appointment in: First Available with Dr Curt Bears or EP extender   Any Other Special Instructions Will Be Listed Below (If Applicable).   If you need a refill on your cardiac medications before your next appointment, please call your pharmacy.

## 2016-04-30 ENCOUNTER — Ambulatory Visit: Payer: Medicare Other | Admitting: Physician Assistant

## 2016-04-30 DIAGNOSIS — N2581 Secondary hyperparathyroidism of renal origin: Secondary | ICD-10-CM | POA: Diagnosis not present

## 2016-04-30 DIAGNOSIS — E1129 Type 2 diabetes mellitus with other diabetic kidney complication: Secondary | ICD-10-CM | POA: Diagnosis not present

## 2016-04-30 DIAGNOSIS — N186 End stage renal disease: Secondary | ICD-10-CM | POA: Diagnosis not present

## 2016-04-30 DIAGNOSIS — D631 Anemia in chronic kidney disease: Secondary | ICD-10-CM | POA: Diagnosis not present

## 2016-05-03 DIAGNOSIS — D631 Anemia in chronic kidney disease: Secondary | ICD-10-CM | POA: Diagnosis not present

## 2016-05-03 DIAGNOSIS — E1129 Type 2 diabetes mellitus with other diabetic kidney complication: Secondary | ICD-10-CM | POA: Diagnosis not present

## 2016-05-03 DIAGNOSIS — N2581 Secondary hyperparathyroidism of renal origin: Secondary | ICD-10-CM | POA: Diagnosis not present

## 2016-05-03 DIAGNOSIS — N186 End stage renal disease: Secondary | ICD-10-CM | POA: Diagnosis not present

## 2016-05-04 ENCOUNTER — Encounter: Payer: Self-pay | Admitting: Cardiology

## 2016-05-04 ENCOUNTER — Ambulatory Visit (INDEPENDENT_AMBULATORY_CARE_PROVIDER_SITE_OTHER): Payer: Medicare Other | Admitting: Cardiology

## 2016-05-04 VITALS — BP 126/70 | HR 86 | Ht 70.5 in | Wt 213.0 lb

## 2016-05-04 DIAGNOSIS — R001 Bradycardia, unspecified: Secondary | ICD-10-CM

## 2016-05-04 DIAGNOSIS — I481 Persistent atrial fibrillation: Secondary | ICD-10-CM | POA: Diagnosis not present

## 2016-05-04 DIAGNOSIS — I4819 Other persistent atrial fibrillation: Secondary | ICD-10-CM

## 2016-05-04 LAB — CUP PACEART INCLINIC DEVICE CHECK
Brady Statistic RV Percent Paced: 23 %
Date Time Interrogation Session: 20180206100137
Implantable Lead Implant Date: 20161213
Implantable Lead Location: 753860
Implantable Pulse Generator Implant Date: 20161213
Lead Channel Impedance Value: 337.5 Ohm
Lead Channel Impedance Value: 462.5 Ohm
Lead Channel Pacing Threshold Amplitude: 0.75 V
Lead Channel Pacing Threshold Pulse Width: 0.5 ms
Lead Channel Pacing Threshold Pulse Width: 0.5 ms
Lead Channel Sensing Intrinsic Amplitude: 11.1 mV
Lead Channel Setting Pacing Amplitude: 1.75 V
Lead Channel Setting Sensing Sensitivity: 2 mV
MDC IDC LEAD IMPLANT DT: 20161213
MDC IDC LEAD LOCATION: 753859
MDC IDC MSMT BATTERY VOLTAGE: 3.01 V
MDC IDC MSMT LEADCHNL RA PACING THRESHOLD AMPLITUDE: 0.75 V
MDC IDC MSMT LEADCHNL RA SENSING INTR AMPL: 1 mV
MDC IDC MSMT LEADCHNL RV PACING THRESHOLD AMPLITUDE: 0.75 V
MDC IDC MSMT LEADCHNL RV PACING THRESHOLD PULSEWIDTH: 0.5 ms
MDC IDC SET LEADCHNL RV PACING AMPLITUDE: 2.5 V
MDC IDC SET LEADCHNL RV PACING PULSEWIDTH: 0.5 ms
MDC IDC STAT BRADY RA PERCENT PACED: 0 %
Pulse Gen Model: 2240
Pulse Gen Serial Number: 7839215

## 2016-05-04 NOTE — Progress Notes (Signed)
Electrophysiology Office Note   Date:  05/04/2016   ID:  Alexander [name], DOB 11/14/1940, MRN [id number]  PCP:  Horatio Pel, MD  Cardiologist:  Skeet Latch Primary Electrophysiologist:  Emmalia Heyboer Meredith [name], MD    Chief Complaint  Patient presents with  . Pacemaker Check    Persistent Afib/SND  . Shortness of Breath  . Dizziness     History of Present Illness: Alexander [name] is a 76 y.o. male who presents today for electrophysiology evaluation.   He has a history of moderate aortic stenosis, ESRD on HD, diabetes mellitus, hyperlipidemia, carotid artery disease with complete occlusion of the L carotid, prior TIA and atrial fibrillation.  He had a dual chamber pacemaker placed on 03/11/15. At that time, he was feeling weak and dizzy with presyncopal symptoms. He was loaded on amiodarone and had cardioversion, but quickly returned to atrial fibrillation. The amiodarone was stopped and he was put on metoprolol at that time.  Today, he denies symptoms of palpitations, chest pain, shortness of breath, orthopnea, PND, lower extremity edema, claudication bleeding, or neurologic sequela. He is continued to feel significant fatigue and shortness of breath. He says that at times he gets dizzy upon standing. He is also been having back and right leg pain. He was getting injections in his back up to 2 years ago, but the physician moved out of town and he is yet to find another physician.  Past Medical History:  Diagnosis Date  . Anemia   . Aortic stenosis 06/15/12   TEE - EF 0000000; grade 1 diastolic dysfunction; mild/mod aortic valve stenosis; Mitral valve had calcified annulus, mild pulm htn PA peak pressure 81mmHg  . Barrett's esophagus 05/2003  . Bradycardia 2017   [employer] 2240 Assurity dual-lead pacemaker  . Carpal tunnel syndrome, bilateral 11/03/2015  . Colon polyps   . CVA (cerebral infarction)    2004/affected left side  . Depression   . Diabetes mellitus without  complication ([place])   . Diabetic peripheral neuropathy ([place]) 10/02/2015  . Diverticulosis   . End stage renal disease ([place])    hemodialysis 3 times a week  . GERD (gastroesophageal reflux disease)   . Hyperlipidemia   . Hypertension   . Kidney stones   . Macular degeneration    both eyes  . Orthostatic hypotension 09/09/2015  . Paroxysmal atrial fibrillation ([facility])   . Peptic ulcer    bleeding, 1969  . S/P epidural steroid injection    last  injection over 10 years ago  . Seasonal allergies   . Tubular adenoma of colon 07/2001   Past Surgical History:  Procedure Laterality Date  . AV FISTULA PLACEMENT  2009  . BACK SURGERY    . CARDIOVERSION N/A 11/13/2015   Procedure: CARDIOVERSION;  Surgeon: Troy Sine, MD;  Location: [facility];  Service: Cardiovascular;  Laterality: N/A;  . CARDIOVERSION N/A 01/13/2016   Procedure: CARDIOVERSION;  Surgeon: Nesreen Albano Meredith [name], MD;  Location: [facility];  Service: Cardiovascular;  Laterality: N/A;  . [place]   right eye  . CYSTOSCOPY  several times   kidney stones  . EP IMPLANTABLE DEVICE N/A 03/11/2015   Procedure: Pacemaker Implant;  Surgeon: Evianna Chandran Meredith [name], MD;  [place];  Laterality: Left  . LAMINECTOMY  1969  . TONSILLECTOMY  1964     Current Outpatient Prescriptions  Medication Sig Dispense Refill  . acetaminophen (TYLENOL) 500 MG tablet Take 1,000 mg by mouth every  6 (six) hours as needed for pain.    [name] atorvastatin (LIPITOR) 40 MG tablet Take 20 mg by mouth daily.    [name] b complex-vitamin c-folic acid (NEPHRO-VITE) 0.8 MG TABS Take 1 tablet by mouth daily.     . bromocriptine (PARLODEL) 5 MG capsule Take 5 mg by mouth at bedtime.     . calcium acetate (PHOSLO) 667 MG capsule Take 667 mg by mouth 4 (four) times daily.     . cefdinir (OMNICEF) 300 MG capsule Take 1 capsule (300 mg total) by mouth every other day. Take after dialysis 4 capsule 0  . cetirizine (ZYRTEC) 10 MG tablet Take  10 mg by mouth at bedtime.     . dorzolamide-timolol (COSOPT) 22.3-6.8 MG/ML ophthalmic solution Place 1 drop into the right eye 2 (two) times daily.    [name] FLUoxetine (PROZAC) 20 MG capsule Take 40 mg by mouth daily.     . metoprolol tartrate (LOPRESSOR) 25 MG tablet TAKE 1 TABLET BY MOUTH TWICE DAILY 180 tablet 3  . midodrine (PROAMATINE) 5 MG tablet Take 3 tablets (15 mg total) by mouth 3 (three) times daily as needed (for systolic blood pressure less than 130). (Patient taking differently: Take 5 mg by mouth 3 (three) times daily as needed (for systolic blood pressure less than 130). ) 270 tablet 3  . omeprazole (PRILOSEC) 20 MG capsule Take 20 mg by mouth daily.    . predniSONE (DELTASONE) 5 MG tablet Take 1 tablet by mouth daily.  1  . SENSIPAR 30 MG tablet Take 1 tablet by mouth daily.  5  . warfarin (COUMADIN) 5 MG tablet Take 1-1.5 tablets by mouth daily as directed by coumadin clinic 120 tablet 1   No current facility-administered medications for this visit.     Allergies:   Codeine; Penicillins; and Tramadol   Social History:  The patient  reports that he quit smoking about 18 years ago. He has quit using smokeless tobacco. He reports that he does not drink alcohol or use drugs.   Family History:  The patient's family history includes Cancer in his brother; Heart disease in his sister; Hypertension in his father; Stomach cancer in his mother; Stroke in his sister.    ROS:  Please see the history of present illness.   Otherwise, review of systems is positive for none.   All other systems are reviewed and negative.    PHYSICAL EXAM: VS:  BP 126/70   Pulse 86   Alexander 5' 10.5" (1.791 m)   Wt 213 lb (96.6 kg)   BMI 30.13 kg/m  , BMI Body mass index is 30.13 kg/m. GEN: Well nourished, well developed, in no acute distress  HEENT: normal  Neck: no JVD, carotid bruits, or masses Cardiac: iRRR, irregular; no murmurs, rubs, or gallops,no edema  Respiratory:  clear to auscultation  bilaterally, normal work of breathing GI: soft, nontender, nondistended, + BS MS: no deformity or atrophy  Skin: warm and dry, fistula left forearm, device pocket C/D/I Neuro:  Strength and sensation are intact Psych: euthymic mood, full affect  EKG:  EKG is not ordered today. Personal review of the ECG shows 04/29/16 atrial fibrillation, intermittent V pacing  Device interrogation performed today, results in [facility].   Recent Labs: 10/04/2015: ALT 20 01/02/2016: BUN 15; Creat 3.61; Platelets 257 01/13/2016: Hemoglobin 10.5; Potassium 5.6; Sodium 138    Lipid Panel     Component Value Date/Time   CHOL 138 10/28/2015 1008   TRIG 158 (H)  10/28/2015 1008   HDL 30 (L) 10/28/2015 1008   CHOLHDL 4.6 10/28/2015 1008   VLDL 32 (H) 10/28/2015 1008   LDLCALC 76 10/28/2015 1008     Wt Readings from Last 3 Encounters:  05/04/16 213 lb (96.6 kg)  04/29/16 213 lb (96.6 kg)  02/05/16 209 lb 12.8 oz (95.2 kg)      11/28/14 TTE: Study Conclusions  - Left ventricle: The cavity size was normal. Systolic function was normal. The estimated ejection fraction was in the range of 60% to 65%. Wall motion was normal; there were no regional wall motion abnormalities. Features are consistent with a pseudonormal left ventricular filling pattern, with concomitant abnormal relaxation and increased filling pressure (grade 2 diastolic dysfunction). Doppler parameters are consistent with high ventricular filling pressure. - Aortic valve: Severe diffuse thickening and calcification. There was moderate stenosis. VTI ratio of LVOT to aortic valve: 0.31. Valve area (VTI): 1.27 cm^2. Indexed valve area (VTI): 0.59 cm^2/m^2. Mean velocity ratio of LVOT to aortic valve: 0.31. Valve area (Vmean): 1.29 cm^2. Indexed valve area (Vmean): 0.6 cm^2/m^2.  Mean gradient (S): 22 mm Hg. - Mitral valve: Severely calcified annulus. There was mild regurgitation. - Left atrium: The atrium was mildly  dilated. - Right ventricle: The cavity size was mildly dilated. Wall thickness was normal. - Tricuspid valve: There was trivial regurgitation. - Pulmonic valve: There was trivial regurgitation. - Pulmonary arteries: PA peak pressure: 37 mm Hg (S).   ASSESSMENT AND PLAN:  1.  Atrial fibrillation: on warfarin.  Has CHADS2VASc of 4.  Currently taking warfarin. Currently rate controlled with metoprolol. Unfortunately he is continuing to feel poorly. It appears that amiodarone is his only medical option for atrial fibrillation. At this time, he Kaytelynn Scripter stay in atrial fibrillation with rate control.  2. Presyncope: Dual-chamber pacemaker placed in December. Functioning appropriately.   Current medicines are reviewed at length with the patient today.   The patient has concerns regarding his medicines.  The following changes were made today:  Stop amiodarone, start metoprolol  Labs/ tests ordered today include:   No orders of the defined types were placed in this encounter.    Disposition:   FU with Virginia Francisco Meredith [name] 6  months  Signed, Amahd Morino Meredith [name], MD  05/04/2016 10:00 AM     [facility] [address] [place] [address] [phone] (office) [phone] (fax)

## 2016-05-04 NOTE — Patient Instructions (Signed)
Medication Instructions:    Your physician recommends that you continue on your current medications as directed. Please refer to the Current Medication list given to you today.  --- If you need a refill on your cardiac medications before your next appointment, please call your pharmacy. ---  Labwork:  None ordered  Testing/Procedures:  None ordered  Follow-Up: Remote monitoring is used to monitor your Pacemaker of ICD from home. This monitoring reduces the number of office visits required to check your device to one time per year. It allows [place] to keep an eye on the functioning of your device to ensure it is working properly. You are scheduled for a device check from home on 08/03/2016. You may send your transmission at any time that day. If you have a wireless device, the transmission will be sent automatically. After your physician reviews your transmission, you will receive a postcard with your next transmission date.   Your physician wants you to follow-up in: 6 months with Dr. Curt Bears.  You will receive a reminder letter in the mail two months in advance. If you don't receive a letter, please call our office to schedule the follow-up appointment.  Thank you for choosing [facility]!!   Trinidad Curet, RN [phone]

## 2016-05-05 DIAGNOSIS — N186 End stage renal disease: Secondary | ICD-10-CM | POA: Diagnosis not present

## 2016-05-05 DIAGNOSIS — N2581 Secondary hyperparathyroidism of renal origin: Secondary | ICD-10-CM | POA: Diagnosis not present

## 2016-05-05 DIAGNOSIS — D631 Anemia in chronic kidney disease: Secondary | ICD-10-CM | POA: Diagnosis not present

## 2016-05-05 DIAGNOSIS — E1129 Type 2 diabetes mellitus with other diabetic kidney complication: Secondary | ICD-10-CM | POA: Diagnosis not present

## 2016-05-07 DIAGNOSIS — E1129 Type 2 diabetes mellitus with other diabetic kidney complication: Secondary | ICD-10-CM | POA: Diagnosis not present

## 2016-05-07 DIAGNOSIS — N2581 Secondary hyperparathyroidism of renal origin: Secondary | ICD-10-CM | POA: Diagnosis not present

## 2016-05-07 DIAGNOSIS — N186 End stage renal disease: Secondary | ICD-10-CM | POA: Diagnosis not present

## 2016-05-07 DIAGNOSIS — D631 Anemia in chronic kidney disease: Secondary | ICD-10-CM | POA: Diagnosis not present

## 2016-05-10 DIAGNOSIS — E1129 Type 2 diabetes mellitus with other diabetic kidney complication: Secondary | ICD-10-CM | POA: Diagnosis not present

## 2016-05-10 DIAGNOSIS — N186 End stage renal disease: Secondary | ICD-10-CM | POA: Diagnosis not present

## 2016-05-10 DIAGNOSIS — N2581 Secondary hyperparathyroidism of renal origin: Secondary | ICD-10-CM | POA: Diagnosis not present

## 2016-05-10 DIAGNOSIS — D631 Anemia in chronic kidney disease: Secondary | ICD-10-CM | POA: Diagnosis not present

## 2016-05-12 DIAGNOSIS — D631 Anemia in chronic kidney disease: Secondary | ICD-10-CM | POA: Diagnosis not present

## 2016-05-12 DIAGNOSIS — N2581 Secondary hyperparathyroidism of renal origin: Secondary | ICD-10-CM | POA: Diagnosis not present

## 2016-05-12 DIAGNOSIS — N186 End stage renal disease: Secondary | ICD-10-CM | POA: Diagnosis not present

## 2016-05-12 DIAGNOSIS — E1129 Type 2 diabetes mellitus with other diabetic kidney complication: Secondary | ICD-10-CM | POA: Diagnosis not present

## 2016-05-13 ENCOUNTER — Other Ambulatory Visit (HOSPITAL_COMMUNITY): Payer: Medicare Other

## 2016-05-14 DIAGNOSIS — D631 Anemia in chronic kidney disease: Secondary | ICD-10-CM | POA: Diagnosis not present

## 2016-05-14 DIAGNOSIS — N186 End stage renal disease: Secondary | ICD-10-CM | POA: Diagnosis not present

## 2016-05-14 DIAGNOSIS — N2581 Secondary hyperparathyroidism of renal origin: Secondary | ICD-10-CM | POA: Diagnosis not present

## 2016-05-14 DIAGNOSIS — E1129 Type 2 diabetes mellitus with other diabetic kidney complication: Secondary | ICD-10-CM | POA: Diagnosis not present

## 2016-05-17 DIAGNOSIS — N2581 Secondary hyperparathyroidism of renal origin: Secondary | ICD-10-CM | POA: Diagnosis not present

## 2016-05-17 DIAGNOSIS — D631 Anemia in chronic kidney disease: Secondary | ICD-10-CM | POA: Diagnosis not present

## 2016-05-17 DIAGNOSIS — E1129 Type 2 diabetes mellitus with other diabetic kidney complication: Secondary | ICD-10-CM | POA: Diagnosis not present

## 2016-05-17 DIAGNOSIS — N186 End stage renal disease: Secondary | ICD-10-CM | POA: Diagnosis not present

## 2016-05-19 ENCOUNTER — Ambulatory Visit (INDEPENDENT_AMBULATORY_CARE_PROVIDER_SITE_OTHER): Payer: Medicare Other | Admitting: Pharmacist

## 2016-05-19 DIAGNOSIS — Z7901 Long term (current) use of anticoagulants: Secondary | ICD-10-CM

## 2016-05-19 DIAGNOSIS — N186 End stage renal disease: Secondary | ICD-10-CM | POA: Diagnosis not present

## 2016-05-19 DIAGNOSIS — N2581 Secondary hyperparathyroidism of renal origin: Secondary | ICD-10-CM | POA: Diagnosis not present

## 2016-05-19 DIAGNOSIS — I480 Paroxysmal atrial fibrillation: Secondary | ICD-10-CM | POA: Diagnosis not present

## 2016-05-19 DIAGNOSIS — I482 Chronic atrial fibrillation: Secondary | ICD-10-CM | POA: Diagnosis not present

## 2016-05-19 DIAGNOSIS — E1129 Type 2 diabetes mellitus with other diabetic kidney complication: Secondary | ICD-10-CM | POA: Diagnosis not present

## 2016-05-19 DIAGNOSIS — D631 Anemia in chronic kidney disease: Secondary | ICD-10-CM | POA: Diagnosis not present

## 2016-05-19 LAB — POCT INR: INR: 2.3

## 2016-05-21 DIAGNOSIS — D631 Anemia in chronic kidney disease: Secondary | ICD-10-CM | POA: Diagnosis not present

## 2016-05-21 DIAGNOSIS — E1129 Type 2 diabetes mellitus with other diabetic kidney complication: Secondary | ICD-10-CM | POA: Diagnosis not present

## 2016-05-21 DIAGNOSIS — N2581 Secondary hyperparathyroidism of renal origin: Secondary | ICD-10-CM | POA: Diagnosis not present

## 2016-05-21 DIAGNOSIS — N186 End stage renal disease: Secondary | ICD-10-CM | POA: Diagnosis not present

## 2016-05-24 DIAGNOSIS — D631 Anemia in chronic kidney disease: Secondary | ICD-10-CM | POA: Diagnosis not present

## 2016-05-24 DIAGNOSIS — E1129 Type 2 diabetes mellitus with other diabetic kidney complication: Secondary | ICD-10-CM | POA: Diagnosis not present

## 2016-05-24 DIAGNOSIS — N186 End stage renal disease: Secondary | ICD-10-CM | POA: Diagnosis not present

## 2016-05-24 DIAGNOSIS — N2581 Secondary hyperparathyroidism of renal origin: Secondary | ICD-10-CM | POA: Diagnosis not present

## 2016-05-26 DIAGNOSIS — Z992 Dependence on renal dialysis: Secondary | ICD-10-CM | POA: Diagnosis not present

## 2016-05-26 DIAGNOSIS — E1129 Type 2 diabetes mellitus with other diabetic kidney complication: Secondary | ICD-10-CM | POA: Diagnosis not present

## 2016-05-26 DIAGNOSIS — N186 End stage renal disease: Secondary | ICD-10-CM | POA: Diagnosis not present

## 2016-05-26 DIAGNOSIS — D631 Anemia in chronic kidney disease: Secondary | ICD-10-CM | POA: Diagnosis not present

## 2016-05-26 DIAGNOSIS — N2581 Secondary hyperparathyroidism of renal origin: Secondary | ICD-10-CM | POA: Diagnosis not present

## 2016-05-28 DIAGNOSIS — N186 End stage renal disease: Secondary | ICD-10-CM | POA: Diagnosis not present

## 2016-05-31 DIAGNOSIS — N186 End stage renal disease: Secondary | ICD-10-CM | POA: Diagnosis not present

## 2016-05-31 DIAGNOSIS — D631 Anemia in chronic kidney disease: Secondary | ICD-10-CM | POA: Diagnosis not present

## 2016-05-31 DIAGNOSIS — N2581 Secondary hyperparathyroidism of renal origin: Secondary | ICD-10-CM | POA: Diagnosis not present

## 2016-05-31 DIAGNOSIS — E1129 Type 2 diabetes mellitus with other diabetic kidney complication: Secondary | ICD-10-CM | POA: Diagnosis not present

## 2016-06-02 DIAGNOSIS — E1129 Type 2 diabetes mellitus with other diabetic kidney complication: Secondary | ICD-10-CM | POA: Diagnosis not present

## 2016-06-02 DIAGNOSIS — N186 End stage renal disease: Secondary | ICD-10-CM | POA: Diagnosis not present

## 2016-06-02 DIAGNOSIS — N2581 Secondary hyperparathyroidism of renal origin: Secondary | ICD-10-CM | POA: Diagnosis not present

## 2016-06-02 DIAGNOSIS — D631 Anemia in chronic kidney disease: Secondary | ICD-10-CM | POA: Diagnosis not present

## 2016-06-04 DIAGNOSIS — N2581 Secondary hyperparathyroidism of renal origin: Secondary | ICD-10-CM | POA: Diagnosis not present

## 2016-06-04 DIAGNOSIS — N186 End stage renal disease: Secondary | ICD-10-CM | POA: Diagnosis not present

## 2016-06-04 DIAGNOSIS — D631 Anemia in chronic kidney disease: Secondary | ICD-10-CM | POA: Diagnosis not present

## 2016-06-04 DIAGNOSIS — E1129 Type 2 diabetes mellitus with other diabetic kidney complication: Secondary | ICD-10-CM | POA: Diagnosis not present

## 2016-06-07 DIAGNOSIS — N186 End stage renal disease: Secondary | ICD-10-CM | POA: Diagnosis not present

## 2016-06-07 DIAGNOSIS — N2581 Secondary hyperparathyroidism of renal origin: Secondary | ICD-10-CM | POA: Diagnosis not present

## 2016-06-07 DIAGNOSIS — E1129 Type 2 diabetes mellitus with other diabetic kidney complication: Secondary | ICD-10-CM | POA: Diagnosis not present

## 2016-06-07 DIAGNOSIS — D631 Anemia in chronic kidney disease: Secondary | ICD-10-CM | POA: Diagnosis not present

## 2016-06-09 DIAGNOSIS — N186 End stage renal disease: Secondary | ICD-10-CM | POA: Diagnosis not present

## 2016-06-09 DIAGNOSIS — D631 Anemia in chronic kidney disease: Secondary | ICD-10-CM | POA: Diagnosis not present

## 2016-06-09 DIAGNOSIS — N2581 Secondary hyperparathyroidism of renal origin: Secondary | ICD-10-CM | POA: Diagnosis not present

## 2016-06-09 DIAGNOSIS — E1129 Type 2 diabetes mellitus with other diabetic kidney complication: Secondary | ICD-10-CM | POA: Diagnosis not present

## 2016-06-11 DIAGNOSIS — N2581 Secondary hyperparathyroidism of renal origin: Secondary | ICD-10-CM | POA: Diagnosis not present

## 2016-06-11 DIAGNOSIS — N186 End stage renal disease: Secondary | ICD-10-CM | POA: Diagnosis not present

## 2016-06-11 DIAGNOSIS — E1129 Type 2 diabetes mellitus with other diabetic kidney complication: Secondary | ICD-10-CM | POA: Diagnosis not present

## 2016-06-11 DIAGNOSIS — D631 Anemia in chronic kidney disease: Secondary | ICD-10-CM | POA: Diagnosis not present

## 2016-06-14 DIAGNOSIS — N2581 Secondary hyperparathyroidism of renal origin: Secondary | ICD-10-CM | POA: Diagnosis not present

## 2016-06-14 DIAGNOSIS — D631 Anemia in chronic kidney disease: Secondary | ICD-10-CM | POA: Diagnosis not present

## 2016-06-14 DIAGNOSIS — N186 End stage renal disease: Secondary | ICD-10-CM | POA: Diagnosis not present

## 2016-06-14 DIAGNOSIS — E1129 Type 2 diabetes mellitus with other diabetic kidney complication: Secondary | ICD-10-CM | POA: Diagnosis not present

## 2016-06-16 ENCOUNTER — Ambulatory Visit (INDEPENDENT_AMBULATORY_CARE_PROVIDER_SITE_OTHER): Payer: Medicare Other | Admitting: Pharmacist Clinician (PhC)/ Clinical Pharmacy Specialist

## 2016-06-16 DIAGNOSIS — I480 Paroxysmal atrial fibrillation: Secondary | ICD-10-CM | POA: Diagnosis not present

## 2016-06-16 DIAGNOSIS — D631 Anemia in chronic kidney disease: Secondary | ICD-10-CM | POA: Diagnosis not present

## 2016-06-16 DIAGNOSIS — N186 End stage renal disease: Secondary | ICD-10-CM | POA: Diagnosis not present

## 2016-06-16 DIAGNOSIS — Z7901 Long term (current) use of anticoagulants: Secondary | ICD-10-CM

## 2016-06-16 DIAGNOSIS — E1129 Type 2 diabetes mellitus with other diabetic kidney complication: Secondary | ICD-10-CM | POA: Diagnosis not present

## 2016-06-16 DIAGNOSIS — N2581 Secondary hyperparathyroidism of renal origin: Secondary | ICD-10-CM | POA: Diagnosis not present

## 2016-06-16 LAB — POCT INR: INR: 3.5

## 2016-06-18 DIAGNOSIS — E1129 Type 2 diabetes mellitus with other diabetic kidney complication: Secondary | ICD-10-CM | POA: Diagnosis not present

## 2016-06-18 DIAGNOSIS — D631 Anemia in chronic kidney disease: Secondary | ICD-10-CM | POA: Diagnosis not present

## 2016-06-18 DIAGNOSIS — N186 End stage renal disease: Secondary | ICD-10-CM | POA: Diagnosis not present

## 2016-06-18 DIAGNOSIS — N2581 Secondary hyperparathyroidism of renal origin: Secondary | ICD-10-CM | POA: Diagnosis not present

## 2016-06-21 ENCOUNTER — Other Ambulatory Visit: Payer: Self-pay

## 2016-06-21 ENCOUNTER — Ambulatory Visit (HOSPITAL_COMMUNITY): Payer: Medicare Other | Attending: Cardiology

## 2016-06-21 DIAGNOSIS — E1129 Type 2 diabetes mellitus with other diabetic kidney complication: Secondary | ICD-10-CM | POA: Diagnosis not present

## 2016-06-21 DIAGNOSIS — I081 Rheumatic disorders of both mitral and tricuspid valves: Secondary | ICD-10-CM | POA: Insufficient documentation

## 2016-06-21 DIAGNOSIS — E1122 Type 2 diabetes mellitus with diabetic chronic kidney disease: Secondary | ICD-10-CM | POA: Insufficient documentation

## 2016-06-21 DIAGNOSIS — N2581 Secondary hyperparathyroidism of renal origin: Secondary | ICD-10-CM | POA: Diagnosis not present

## 2016-06-21 DIAGNOSIS — I120 Hypertensive chronic kidney disease with stage 5 chronic kidney disease or end stage renal disease: Secondary | ICD-10-CM | POA: Diagnosis not present

## 2016-06-21 DIAGNOSIS — I481 Persistent atrial fibrillation: Secondary | ICD-10-CM | POA: Diagnosis not present

## 2016-06-21 DIAGNOSIS — N186 End stage renal disease: Secondary | ICD-10-CM | POA: Insufficient documentation

## 2016-06-21 DIAGNOSIS — D631 Anemia in chronic kidney disease: Secondary | ICD-10-CM | POA: Diagnosis not present

## 2016-06-21 DIAGNOSIS — E785 Hyperlipidemia, unspecified: Secondary | ICD-10-CM | POA: Insufficient documentation

## 2016-06-21 DIAGNOSIS — I4819 Other persistent atrial fibrillation: Secondary | ICD-10-CM

## 2016-06-22 ENCOUNTER — Encounter: Payer: Self-pay | Admitting: Physician Assistant

## 2016-06-22 ENCOUNTER — Ambulatory Visit (INDEPENDENT_AMBULATORY_CARE_PROVIDER_SITE_OTHER): Payer: Medicare Other | Admitting: Physician Assistant

## 2016-06-22 VITALS — BP 110/70 | HR 80 | Ht 70.5 in | Wt 212.0 lb

## 2016-06-22 DIAGNOSIS — N186 End stage renal disease: Secondary | ICD-10-CM | POA: Diagnosis not present

## 2016-06-22 DIAGNOSIS — Z7901 Long term (current) use of anticoagulants: Secondary | ICD-10-CM

## 2016-06-22 DIAGNOSIS — M7582 Other shoulder lesions, left shoulder: Secondary | ICD-10-CM | POA: Diagnosis not present

## 2016-06-22 DIAGNOSIS — Z86010 Personal history of colonic polyps: Secondary | ICD-10-CM

## 2016-06-22 DIAGNOSIS — M1189 Other specified crystal arthropathies, multiple sites: Secondary | ICD-10-CM | POA: Diagnosis not present

## 2016-06-22 NOTE — Progress Notes (Signed)
Reviewed and agree with management plan.  Mynor Witkop T. Declyn Delsol, MD FACG 

## 2016-06-22 NOTE — Patient Instructions (Signed)
If you are age 76 or older, your body mass index should be between 23-30. Your Body mass index is 29.99 kg/m. If this is out of the aforementioned range listed, please consider follow up with your Primary Care Provider.  If you are age 22 or younger, your body mass index should be between 19-25. Your Body mass index is 29.99 kg/m. If this is out of the aformentioned range listed, please consider follow up with your Primary Care Provider.   Please follow up with Dr. Fuller Plan or Nicoletta Ba, PA as needed.  Thank you for choosing me and [facility].  Nicoletta Ba, PA

## 2016-06-22 NOTE — Progress Notes (Signed)
Subjective:    Patient ID: Alexander [name], male    DOB: 05/25/1940, 76 y.o.   MRN: [id number]  HPI Alexander [name] is a very nice 76 year old white male known to Alexander [name]. He comes in today to discuss follow-up colonoscopy. The patient has history of adenomatous colon polyps and last had colonoscopy in December 2014. He had 5 polyps removed, all were adenomatous, also noted moderate diverticulosis and large internal hemorrhoids. He is indicated for 3 year interval follow-up. Patient has multiple other medical problems including atrial fibrillation for which she is now being maintained on Coumadin, aortic stenosis, peripheral arterial disease, adult-onset diabetes mellitus, end-stage renal disease on dialysis, and also had a remote TIA. He has a pacemaker. He says he has not been feeling well recently and has had an increase in fatigue and dyspnea especially with any exertion over the past couple of months. He is also now being treated for hypotension with midodrine. He and his wife both state that his blood pressure drops quite low after dialysis and he has to take midodrine on the dialysis days. He is also checking his blood pressure at home, and any time systolic is less than 270 he takes midodrine  because he usually feels very poorly. His wife says that when his blood pressure gets too low he gets lightheaded, and passes out. Patient had been in to see cardiology and actually had a 2-D echo done yesterday. Report is final,and shows an EF of 60-65% but moderate to severe aortic stenosis Patient has no concerns regarding abdominal pain and changes in bowel habits melena or hematochezia. He also had EGD in 2015 because of history of Barrett's. Biopsies showed evidence of Barrett's but no dysplasia.  Review of Systems Pertinent positive and negative review of systems were noted in the above HPI section.  All other review of systems was otherwise negative.  Outpatient Encounter Prescriptions as of 06/22/2016    Medication Sig  . acetaminophen (TYLENOL) 500 MG tablet Take 1,000 mg by mouth every 6 (six) hours as needed for pain.  [name] atorvastatin (LIPITOR) 40 MG tablet Take 20 mg by mouth daily.  [name] b complex-vitamin c-folic acid (NEPHRO-VITE) 0.8 MG TABS Take 1 tablet by mouth daily.   . bromocriptine (PARLODEL) 5 MG capsule Take 5 mg by mouth at bedtime.   . calcium acetate (PHOSLO) 667 MG capsule Take 667 mg by mouth 4 (four) times daily.   . cefdinir (OMNICEF) 300 MG capsule Take 1 capsule (300 mg total) by mouth every other day. Take after dialysis  . cetirizine (ZYRTEC) 10 MG tablet Take 10 mg by mouth at bedtime.   . dorzolamide-timolol (COSOPT) 22.3-6.8 MG/ML ophthalmic solution Place 1 drop into the right eye 2 (two) times daily.  [name] FLUoxetine (PROZAC) 20 MG capsule Take 40 mg by mouth daily.   . metoprolol tartrate (LOPRESSOR) 25 MG tablet TAKE 1 TABLET BY MOUTH TWICE DAILY  . midodrine (PROAMATINE) 5 MG tablet Take 3 tablets (15 mg total) by mouth 3 (three) times daily as needed (for systolic blood pressure less than 130). (Patient taking differently: Take 5 mg by mouth 3 (three) times daily as needed (for systolic blood pressure less than 130). )  . omeprazole (PRILOSEC) 20 MG capsule Take 20 mg by mouth daily.  . predniSONE (DELTASONE) 5 MG tablet Take 1 tablet by mouth daily.  . SENSIPAR 30 MG tablet Take 1 tablet by mouth daily.  [name] warfarin (COUMADIN) 5 MG tablet Take 1-1.5 tablets by mouth  daily as directed by coumadin clinic   No facility-administered encounter medications on file as of 06/22/2016.    Allergies  Allergen Reactions  . Codeine Nausea Only  . Penicillins Swelling    Has patient had a PCN reactise.  . Tramadol Nausea Only   Patient Active Problem List   Diagnosis Date Noted  . Persistent atrial fibrillation ([place])   . Carpal tunnel syndrome, bilateral 11/03/2015  . Hand pain 10/02/2015  . Diabetic peripheral neuropathy ([place]) 10/02/2015  . Orthostatic hypotension  09/09/2015  . Bradycardia 03/11/2015  . End stage renal disease ([place]) 01/28/2015  . Left arm pain 01/28/2015  . Long-term (current) use of anticoagulants 04/08/2014  . Acute bronchitis 04/05/2014  . Hyperlipidemia 04/04/2014  . ESRD (end stage renal disease) on dialysis 03/13/2013  . PAF (paroxysmal atrial fibrillation) ([place]) 12/12/2012  . PAD (peripheral artery disease) ([place]) 12/12/2012  . Chest pain 12/08/2012  . Aortic stenosis 06/15/2012  . Personal history of colonic polyps 02/03/2011  . Esophageal reflux 02/03/2011  . Anemia, unspecified 02/03/2011   Social History   Social History  . Marital status: Married    Spouse name: N/A  . Number of children: 3  . Years of education: 14   Occupational History  . retired Retired   Social History Main Topics  . Smoking status: Former Smoker    Quit date: 01/20/1998  . Smokeless tobacco: Former Systems developer  . Alcohol use No  . Drug use: No  . Sexual activity: Yes   Other Topics Concern  . Not on file   Social History Narrative   Lives at home w/ his wife   Right-handed   Drinks 1 cup of coffee per day    Alexander [name] family history includes Cancer in his brother; Heart disease in his sister; Hypertension in his father; Stomach cancer in his mother; Stroke in his sister.      Objective:    Vitals:   06/22/16 0903  BP: 110/70  Pulse: 80    Physical Exam   well-developed older WM in NAD. BP:110/70  BMI 29.9 HEENT; Nontraumatic normocephalic EOMI, PERRLA sclera anicteric. Not further examined today discussion only.    Assessment & [name]:   #87 76 year old white male with history of multiple adenomatous colon polyps, due for follow-up colonoscopy, last colonoscopy December 2014 Patient has numerous serious comorbidities and has had a decline in overall status since last colonoscopy. He is now on chronic Coumadin for atrial fibrillation. Patient with end-stage renal disease on dialysis. Hypotension intermittent requiring  midodrine Progressive aortic stenosis Patient has been having increasing fatigue, dyspnea, lightheadedness and frequent episodes of hypotension. Cardiac workup is in progress  #2 Barretts esophagus-last surveillance 2015  [name]; Due  to multiple comorbidities as outlined above, I do not feel that he is an appropriate candidate for repeat colonoscopy, and would be very high risk for complications with sedation.  Rationale for foregoing colonoscopy was discussed in detail with the patient and his wife, they expressed understanding, and patient is content  with this decision. I offered reconsideration should he have significant improvement in his cardiac status in the next several months.   Amy Genia Harold PA-C 06/22/2016   Cc: Deland Pretty, MD

## 2016-06-23 ENCOUNTER — Telehealth: Payer: Self-pay | Admitting: *Deleted

## 2016-06-23 ENCOUNTER — Telehealth: Payer: Self-pay | Admitting: Cardiovascular Disease

## 2016-06-23 DIAGNOSIS — N186 End stage renal disease: Secondary | ICD-10-CM | POA: Diagnosis not present

## 2016-06-23 DIAGNOSIS — N2581 Secondary hyperparathyroidism of renal origin: Secondary | ICD-10-CM | POA: Diagnosis not present

## 2016-06-23 DIAGNOSIS — I482 Chronic atrial fibrillation: Secondary | ICD-10-CM | POA: Diagnosis not present

## 2016-06-23 DIAGNOSIS — E1129 Type 2 diabetes mellitus with other diabetic kidney complication: Secondary | ICD-10-CM | POA: Diagnosis not present

## 2016-06-23 DIAGNOSIS — D631 Anemia in chronic kidney disease: Secondary | ICD-10-CM | POA: Diagnosis not present

## 2016-06-23 NOTE — Telephone Encounter (Signed)
-----   Message from Lonn Georgia, PA-C sent at 06/23/2016  4:35 PM EDT ----- Pt of Dr Oval Linsey Please let him know his aortic valve looks tighter than it did previously. If he is having any problems, work in sooner. If he is doing ok, keep f/u appt with Dr Oval Linsey Thanks

## 2016-06-23 NOTE — Telephone Encounter (Signed)
Spoke w patient and wife. Noted concerns on recent echo. Wife does note patient having more trouble ambulating recently. Denies increase in SOB or fatigue, describes as more of his energy level being decreased. I offered to schedule appt w [name] based on recommendations. Availability reviewed.  Pt declined to schedule with me.  Notes from april 13th to 20th, out of town on trip Pt has dialysis on MWF  Aware I will inform [name] and send note to scheduling. He's been added to the April/May waitlist.

## 2016-06-23 NOTE — Telephone Encounter (Signed)
WIFE called,  wanted to know if patient wears a electronic device ( fast pass bracelet at [employer] in 2 weeks) would that interfere with pacer.  RN informed wife will defer to device clinic to answer she verbalized understanding. Please call patient with answer

## 2016-06-23 NOTE — Telephone Encounter (Signed)
Wife/patient given 800# for [employer]. I encouraged them to call to see if the bracelet would cause any interference. Wife voiced understanding.

## 2016-06-23 NOTE — Telephone Encounter (Signed)
Echo has not been reviewed left message will call when result is availble

## 2016-06-23 NOTE — Telephone Encounter (Signed)
Alexander [name] is calling about the Echo results that was done on Monday 06/21/16.  Thanks

## 2016-06-25 DIAGNOSIS — E1129 Type 2 diabetes mellitus with other diabetic kidney complication: Secondary | ICD-10-CM | POA: Diagnosis not present

## 2016-06-25 DIAGNOSIS — N2581 Secondary hyperparathyroidism of renal origin: Secondary | ICD-10-CM | POA: Diagnosis not present

## 2016-06-25 DIAGNOSIS — N186 End stage renal disease: Secondary | ICD-10-CM | POA: Diagnosis not present

## 2016-06-25 DIAGNOSIS — D631 Anemia in chronic kidney disease: Secondary | ICD-10-CM | POA: Diagnosis not present

## 2016-06-25 NOTE — Telephone Encounter (Signed)
This is not emergent but we should have him come in to discuss. Please schedule next available with me.

## 2016-06-25 NOTE — Telephone Encounter (Signed)
Appointment scheduled for 4/9

## 2016-06-25 NOTE — Telephone Encounter (Signed)
Left message to call back  

## 2016-06-26 DIAGNOSIS — E1129 Type 2 diabetes mellitus with other diabetic kidney complication: Secondary | ICD-10-CM | POA: Diagnosis not present

## 2016-06-26 DIAGNOSIS — N186 End stage renal disease: Secondary | ICD-10-CM | POA: Diagnosis not present

## 2016-06-26 DIAGNOSIS — Z992 Dependence on renal dialysis: Secondary | ICD-10-CM | POA: Diagnosis not present

## 2016-06-28 DIAGNOSIS — D631 Anemia in chronic kidney disease: Secondary | ICD-10-CM | POA: Diagnosis not present

## 2016-06-28 DIAGNOSIS — N2581 Secondary hyperparathyroidism of renal origin: Secondary | ICD-10-CM | POA: Diagnosis not present

## 2016-06-28 DIAGNOSIS — E1129 Type 2 diabetes mellitus with other diabetic kidney complication: Secondary | ICD-10-CM | POA: Diagnosis not present

## 2016-06-28 DIAGNOSIS — N186 End stage renal disease: Secondary | ICD-10-CM | POA: Diagnosis not present

## 2016-06-30 ENCOUNTER — Ambulatory Visit (INDEPENDENT_AMBULATORY_CARE_PROVIDER_SITE_OTHER): Payer: Medicare Other | Admitting: Pharmacist Clinician (PhC)/ Clinical Pharmacy Specialist

## 2016-06-30 DIAGNOSIS — Z7901 Long term (current) use of anticoagulants: Secondary | ICD-10-CM | POA: Diagnosis not present

## 2016-06-30 DIAGNOSIS — N186 End stage renal disease: Secondary | ICD-10-CM | POA: Diagnosis not present

## 2016-06-30 DIAGNOSIS — N2581 Secondary hyperparathyroidism of renal origin: Secondary | ICD-10-CM | POA: Diagnosis not present

## 2016-06-30 DIAGNOSIS — D631 Anemia in chronic kidney disease: Secondary | ICD-10-CM | POA: Diagnosis not present

## 2016-06-30 DIAGNOSIS — I480 Paroxysmal atrial fibrillation: Secondary | ICD-10-CM

## 2016-06-30 DIAGNOSIS — E1129 Type 2 diabetes mellitus with other diabetic kidney complication: Secondary | ICD-10-CM | POA: Diagnosis not present

## 2016-06-30 LAB — POCT INR: INR: 3.6

## 2016-07-02 DIAGNOSIS — D631 Anemia in chronic kidney disease: Secondary | ICD-10-CM | POA: Diagnosis not present

## 2016-07-02 DIAGNOSIS — N2581 Secondary hyperparathyroidism of renal origin: Secondary | ICD-10-CM | POA: Diagnosis not present

## 2016-07-02 DIAGNOSIS — N186 End stage renal disease: Secondary | ICD-10-CM | POA: Diagnosis not present

## 2016-07-02 DIAGNOSIS — E1129 Type 2 diabetes mellitus with other diabetic kidney complication: Secondary | ICD-10-CM | POA: Diagnosis not present

## 2016-07-04 NOTE — Progress Notes (Signed)
Cardiology Office Note   Date:  07/06/2016   ID:  [name], DOB 15-May-1940, MRN [id number]  PCP:  Horatio Pel, MD  Cardiologist:   Skeet Latch, MD  Electrophysiologist: Allegra Lai, MD  Chief Complaint  Patient presents with  . Follow-up    echo  . Shortness of Breath    frequently  . Edema    ankles.      History of Present Illness: Alexander [name] is a 76 y.o. male with moderate aortic stenosis, ESRD on HD, diabetes mellitus, hyperlipidemia, carotid artery disease with complete occlusion of the L carotid, prior TIA and paroxysmal atrial fibrillation  who presents for follow up on hypotension.  He was initially evaluated on 12/04/14 with hypotension requiring midodrine and syncope.  He had an echo that revealed LVEF 60-65% with grade 2 diastolic dysfunction, moderate AS and mild MR. He wore a 48 hour Holter 11/2014 that revealed episodes of dizziness associated with heart rates in the 50s. He had a PPM implanted 01/2015.  He had a repeat monitor 12/2014 that showed atrial fibrillation with occasional junctional escape.  He subsequently had a Lexiscan Cardiolite 01/2015 that was negative for ischemia but showed an area of distal inferior infarct vs artifact.  At his last appointment midodrine was increased to 15 mg tid.  He saw Dr. Curt Bears on 10/06/15 where he was found to be in atrial fibrillation since April.  They made a plan to restart amiodarone and schedule DCCV after 3 weeks of therapeutic INR.  Since his last appointment Mr. Stracener has been feeling short of breath.  He denies palpitations but only mild dizziness.  He endorses mild lower extremity edema but denies orthopnea or PND.  His daughter notes that he has been much more fatigued lately.  He hasn't been exerting himself much lately but does get short of breath and has chest tightness with exertion.  He had an echo 06/21/16 that revealed LVEF 60-65% and moderate aortic stenosis with a mean gradient of 33  mmHg.    Past Medical History:  Diagnosis Date  . Anemia   . Aortic stenosis 06/15/12   TEE - EF 37-34%; grade 1 diastolic dysfunction; mild/mod aortic valve stenosis; Mitral valve had calcified annulus, mild pulm htn PA peak pressure 82mmHg  . Barrett's esophagus 05/2003  . Bradycardia 2017   [employer] 2240 Assurity dual-lead pacemaker  . Carpal tunnel syndrome, bilateral 11/03/2015  . Colon polyps   . CVA (cerebral infarction)    2004/affected left side  . Depression   . Diabetes mellitus without complication ([place])   . Diabetic peripheral neuropathy ([place]) 10/02/2015  . Diverticulosis   . End stage renal disease ([place])    hemodialysis 3 times a week  . GERD (gastroesophageal reflux disease)   . Hyperlipidemia   . Hypertension   . Kidney stones   . Macular degeneration    both eyes  . Orthostatic hypotension 09/09/2015  . Paroxysmal atrial fibrillation ([facility])   . Peptic ulcer    bleeding, 1969  . S/P epidural steroid injection    last  injection over 10 years ago  . Seasonal allergies   . Tubular adenoma of colon 07/2001    Past Surgical History:  Procedure Laterality Date  . AV FISTULA PLACEMENT  2009  . BACK SURGERY    . CARDIOVERSION N/A 11/13/2015   Procedure: CARDIOVERSION;  Surgeon: Troy Sine, MD;  Location: [place];  Service: Cardiovascular;  Laterality: N/A;  .  CARDIOVERSION N/A 01/13/2016   Procedure: CARDIOVERSION;  Surgeon: Will Meredith [name], MD;  Location: [place];  Service: Cardiovascular;  Laterality: N/A;  . [place]   right eye  . CYSTOSCOPY  several times   kidney stones  . EP IMPLANTABLE DEVICE N/A 03/11/2015   Procedure: Pacemaker Implant;  Surgeon: Will Meredith [name], MD;  [place];  Laterality: Left  . LAMINECTOMY  1969  . TONSILLECTOMY  1964     Current Outpatient Prescriptions  Medication Sig Dispense Refill  . acetaminophen (TYLENOL) 500 MG tablet Take 1,000 mg by mouth every 6 (six)  hours as needed for pain.    [name] atorvastatin (LIPITOR) 40 MG tablet Take 20 mg by mouth daily.    [name] b complex-vitamin c-folic acid (NEPHRO-VITE) 0.8 MG TABS Take 1 tablet by mouth daily.     . bromocriptine (PARLODEL) 5 MG capsule Take 5 mg by mouth at bedtime.     . calcium acetate (PHOSLO) 667 MG capsule Take 667 mg by mouth 4 (four) times daily.     . cefdinir (OMNICEF) 300 MG capsule Take 1 capsule (300 mg total) by mouth every other day. Take after dialysis 4 capsule 0  . cetirizine (ZYRTEC) 10 MG tablet Take 10 mg by mouth at bedtime.     . dorzolamide-timolol (COSOPT) 22.3-6.8 MG/ML ophthalmic solution Place 1 drop into the right eye 2 (two) times daily.    [name] FLUoxetine (PROZAC) 20 MG capsule Take 40 mg by mouth daily.     . metoprolol tartrate (LOPRESSOR) 25 MG tablet TAKE 1 TABLET BY MOUTH TWICE DAILY 180 tablet 3  . midodrine (PROAMATINE) 5 MG tablet Take 3 tablets (15 mg total) by mouth 3 (three) times daily as needed (for systolic blood pressure less than 130). (Patient taking differently: Take 5 mg by mouth 3 (three) times daily as needed (for systolic blood pressure less than 130). ) 270 tablet 3  . omeprazole (PRILOSEC) 20 MG capsule Take 20 mg by mouth daily.    . predniSONE (DELTASONE) 5 MG tablet Take 1 tablet by mouth daily.  1  . SENSIPAR 30 MG tablet Take 1 tablet by mouth daily.  5  . warfarin (COUMADIN) 5 MG tablet Take 1-1.5 tablets by mouth daily as directed by coumadin clinic 120 tablet 1   No current facility-administered medications for this visit.     Allergies:   Codeine; Penicillins; and Tramadol    Social History:  The patient  reports that he quit smoking about 18 years ago. He has quit using smokeless tobacco. He reports that he does not drink alcohol or use drugs.   Family History:  The patient's family history includes Cancer in his brother; Heart disease in his sister; Hypertension in his father; Stomach cancer in his mother; Stroke in his sister.     ROS:  Please see the history of present illness.   Otherwise, review of systems are positive for none.   All other systems are reviewed and negative.    PHYSICAL EXAM: VS:  BP (!) 144/89   Pulse (!) 104   Ht 5' 10.5" (1.791 m)   Wt 95.3 kg (210 lb 3.2 oz)   BMI 29.73 kg/m  , BMI Body mass index is 29.73 kg/m.  GENERAL:  Tired appearing HEENT:  Pupils equal round and reactive, fundi not visualized, oral mucosa unremarkable NECK:  No jugular venous distention, waveform within normal limits, carotid upstroke brisk and symmetric, no bruits LYMPHATICS:  No cervical adenopathy LUNGS:  Clear to auscultation bilaterally HEART:  Irregularly irregular.  PMI not displaced or sustained,S1 and S2 within normal limits, no S3, no S4, no clicks, no rubs, III/VI late-peaking crescendo-decrescendo murmur at the LUSB. ABD:  Flat, positive bowel sounds normal in frequency in pitch, no bruits, no rebound, no guarding, no midline pulsatile mass, no hepatomegaly, no splenomegaly EXT:  2 plus pulses throughout, no edema, no cyanosis no clubbing SKIN:  No rashes no nodules NEURO:  Cranial nerves II through XII grossly intact, motor grossly intact throughout PSYCH:  Cognitively intact, oriented to person place and time   EKG:  EKG is not ordered today. 09/09/15: Atrial fibrillation rate 108 bpm.  Prior inferior infarct. 01/16/15: Sinus bradycardia.  First degree heart block.  LAFB.  U waves. The ekg from 11/19/14 demonstrates sinus bradycardia at 49 bpm. First degree heart block.   Lexiscan Cardiolite 01/28/15:  Nuclear stress EF: 67%.  The left ventricular ejection fraction is hyperdynamic (>65%).  There was no ST segment deviation noted during stress.  This is a low risk study.  Low risk stress nuclear study with a small, severe, predominantly fixed distal inferior defect consistent with small prior infarct vs thinning; no significant ischemia; EF 67 with normal wall motion.  11/28/14 TTE:   Study Conclusions  - Left ventricle: The cavity size was normal. Systolic function was normal. The estimated ejection fraction was in the range of 60% to 65%. Wall motion was normal; there were no regional wall motion abnormalities. Features are consistent with a pseudonormal left ventricular filling pattern, with concomitant abnormal relaxation and increased filling pressure (grade 2 diastolic dysfunction). Doppler parameters are consistent with high ventricular filling pressure. - Aortic valve: Severe diffuse thickening and calcification. There was moderate stenosis.  VTI ratio of LVOT to aortic valve: 0.31. Valve area (VTI): 1.27 cm^2. Indexed valve area (VTI): 0.59 cm^2/m^2. Mean velocity ratio of LVOT to aortic valve: 0.31. Valve area (Vmean): 1.29 cm^2. Indexed valve area (Vmean): 0.6 cm^2/m^2.  Mean gradient (S): 22 mm Hg. - Mitral valve: Severely calcified annulus. There was mild regurgitation. - Left atrium: The atrium was mildly dilated. - Right ventricle: The cavity size was mildly dilated. Wall thickness was normal. - Tricuspid valve: There was trivial regurgitation. - Pulmonic valve: There was trivial regurgitation. - Pulmonary arteries: PA peak pressure: 37 mm Hg (S).  Echo 06/21/16: Study Conclusions  - Left ventricle: The cavity size was normal. Wall thickness was   increased in a pattern of mild LVH. Systolic function was normal.   The estimated ejection fraction was in the range of 60% to 65%.   Indeterminant diastolic function (atrial fibrillation). Wall   motion was normal; there were no regional wall motion   abnormalities. - Aortic valve: Trileaflet; severely calcified leaflets. There was   moderate to severe stenosis. There was trivial regurgitation.   Mean gradient (S): 33 mm Hg. Peak gradient (S): 61 mm Hg. Valve   area (VTI): 0.81 cm^2. - Aorta: Ascending aortic diameter: 37 mm (S). - Ascending aorta: The ascending aorta was  borderline dilated. - Mitral valve: Moderately calcified annulus. There was mild   regurgitation. - Left atrium: The atrium was moderately dilated. - Right ventricle: The cavity size was normal. Systolic function   was normal. - Right atrium: The atrium was moderately dilated. - Tricuspid valve: Peak RV-RA gradient (S): 35 mm Hg. - Pulmonary arteries: PA peak pressure: 43 mm Hg (S). - Systemic veins: IVC measured 2.0 cm with <  50% respirophasic   variation, suggesting RA pressure 8 mmHg.  Recent Labs: 10/04/2015: ALT 20 01/02/2016: BUN 15; Creat 3.61; Platelets 257 01/13/2016: Hemoglobin 10.5; Potassium 5.6; Sodium 138    Lipid Panel    Component Value Date/Time   CHOL 138 10/28/2015 1008   TRIG 158 (H) 10/28/2015 1008   HDL 30 (L) 10/28/2015 1008   CHOLHDL 4.6 10/28/2015 1008   VLDL 32 (H) 10/28/2015 1008   LDLCALC 76 10/28/2015 1008      Wt Readings from Last 3 Encounters:  07/05/16 95.3 kg (210 lb 3.2 oz)  06/22/16 96.2 kg (212 lb)  05/04/16 96.6 kg (213 lb)      Other studies Reviewed: Additional studies/ records that were reviewed today include: [facility] records Review of the above records demonstrates:  Please see elsewhere in the note.     ASSESSMENT AND PLAN:  # Hypotension/syncope: Mr. Stay is doing much better and denies any recent episodes of pre-syncope.  Continue midodrine to 15 mg tid.    # Bradycardia/atrial fibrillation: Mr. Fettig had a cardioversion but only went into sinus rhythm temporarily.  He is currently in atrial fibrillation with poorly-controlled rates.  Given his hypotension we will not increase his nodal agents at this time. Continue warfarin for anticoagulation.  If his TEE doesn't show severe AS we will try increasing metoprolol.  # Moderate aortic stenosis:  # Fatigue: # Shortness of breath: Mr. Hopes aortic stenosis is moderate and has increased significantly in the last year.  Visually his aortic valve appears significantly  stenotic.  It is unclear how much this is contributing.  We will obtain a TEE to better assess.  We will also get a Lexiscan Myoview to evaluate for ischemia given his chest pressure.  # Hyperlipidemia: LDL 76 10/2015.  Continue atorvastatin.  Current medicines are reviewed at length with the patient today.  The patient does not have concerns regarding medicines.  The following changes have been made: None Labs/ tests ordered today include:   Orders Placed This Encounter  Procedures  . Myocardial Perfusion Imaging     Disposition:   FU with Dr. Jonelle Sidle C. [place] in 1 month   Signed, Skeet Latch, MD  07/06/2016 10:36 PM    [place]

## 2016-07-05 ENCOUNTER — Ambulatory Visit (INDEPENDENT_AMBULATORY_CARE_PROVIDER_SITE_OTHER): Payer: Medicare Other | Admitting: Cardiovascular Disease

## 2016-07-05 ENCOUNTER — Ambulatory Visit (INDEPENDENT_AMBULATORY_CARE_PROVIDER_SITE_OTHER): Payer: Medicare Other | Admitting: Pharmacist

## 2016-07-05 ENCOUNTER — Encounter: Payer: Self-pay | Admitting: Cardiovascular Disease

## 2016-07-05 ENCOUNTER — Encounter: Payer: Self-pay | Admitting: *Deleted

## 2016-07-05 VITALS — BP 144/89 | HR 104 | Ht 70.5 in | Wt 210.2 lb

## 2016-07-05 DIAGNOSIS — Z7901 Long term (current) use of anticoagulants: Secondary | ICD-10-CM | POA: Diagnosis not present

## 2016-07-05 DIAGNOSIS — I952 Hypotension due to drugs: Secondary | ICD-10-CM | POA: Diagnosis not present

## 2016-07-05 DIAGNOSIS — I350 Nonrheumatic aortic (valve) stenosis: Secondary | ICD-10-CM

## 2016-07-05 DIAGNOSIS — I481 Persistent atrial fibrillation: Secondary | ICD-10-CM | POA: Diagnosis not present

## 2016-07-05 DIAGNOSIS — N2581 Secondary hyperparathyroidism of renal origin: Secondary | ICD-10-CM | POA: Diagnosis not present

## 2016-07-05 DIAGNOSIS — R079 Chest pain, unspecified: Secondary | ICD-10-CM | POA: Diagnosis not present

## 2016-07-05 DIAGNOSIS — N186 End stage renal disease: Secondary | ICD-10-CM | POA: Diagnosis not present

## 2016-07-05 DIAGNOSIS — I480 Paroxysmal atrial fibrillation: Secondary | ICD-10-CM | POA: Diagnosis not present

## 2016-07-05 DIAGNOSIS — E7800 Pure hypercholesterolemia, unspecified: Secondary | ICD-10-CM | POA: Diagnosis not present

## 2016-07-05 DIAGNOSIS — E1129 Type 2 diabetes mellitus with other diabetic kidney complication: Secondary | ICD-10-CM | POA: Diagnosis not present

## 2016-07-05 DIAGNOSIS — I4819 Other persistent atrial fibrillation: Secondary | ICD-10-CM

## 2016-07-05 DIAGNOSIS — D631 Anemia in chronic kidney disease: Secondary | ICD-10-CM | POA: Diagnosis not present

## 2016-07-05 LAB — POCT INR: INR: 2.9

## 2016-07-05 NOTE — Patient Instructions (Addendum)
Medication Instructions:  Your physician recommends that you continue on your current medications as directed. Please refer to the Current Medication list given to you today.  Labwork: NONE  Testing/Procedures: Your physician has requested that you have a lexiscan myoview. For further information please visit [email or web address]. Please follow instruction sheet, as given.  WILL ARRANGE FOR YOU TO HAVE A TEE ON 07/22/16 WITH DR [place]   Follow-Up: Your physician recommends that you schedule a follow-up appointment in: [place]  If you need a refill on your cardiac medications before your next appointment, please call your pharmacy.   Transesophageal Echocardiogram Transesophageal echocardiography (TEE) is a picture test of your heart using sound waves. The pictures taken can give very detailed pictures of your heart. This can help your doctor see if there are problems with your heart. TEE can check:  If your heart has blood clots in it.  How well your heart valves are working.  If you have an infection on the inside of your heart.  Some of the major arteries of your heart.  If your heart valve is working after a Office manager.  Your heart before a procedure that uses a shock to your heart to get the rhythm back to normal. What happens before the procedure?  Do not eat or drink for 6 hours before the procedure or as told by your doctor.  Make plans to have someone drive you home after the procedure. Do not drive yourself home.  An IV tube will be put in your arm. What happens during the procedure?  You will be given a medicine to help you relax (sedative). It will be given through the IV tube.  A numbing medicine will be sprayed or gargled in the back of your throat to help numb it.  The tip of the probe is placed into the back of your mouth. You will be asked to swallow. This helps to pass the probe into your esophagus.  Once the tip of the probe is in the right place, your  doctor can take pictures of your heart.  You may feel pressure at the back of your throat. What happens after the procedure?  You will be taken to a recovery area so the sedative can wear off.  Your throat may be sore and scratchy. This will go away slowly over time.  You will go home when you are fully awake and able to swallow liquids.  You should have someone stay with you for the next 24 hours.  Do not drive or operate machinery for the next 24 hours. This information is not intended to replace advice given to you by your health care provider. Make sure you discuss any questions you have with your health care provider. Document Released: 01/10/2009 Document Revised: 08/21/2015 Document Reviewed: 09/14/2012 Elsevier Interactive Patient Education  2017 [employer].

## 2016-07-06 ENCOUNTER — Telehealth (HOSPITAL_COMMUNITY): Payer: Self-pay

## 2016-07-06 NOTE — Telephone Encounter (Signed)
Encounter complete. 

## 2016-07-07 DIAGNOSIS — E1129 Type 2 diabetes mellitus with other diabetic kidney complication: Secondary | ICD-10-CM | POA: Diagnosis not present

## 2016-07-07 DIAGNOSIS — D631 Anemia in chronic kidney disease: Secondary | ICD-10-CM | POA: Diagnosis not present

## 2016-07-07 DIAGNOSIS — N186 End stage renal disease: Secondary | ICD-10-CM | POA: Diagnosis not present

## 2016-07-07 DIAGNOSIS — N2581 Secondary hyperparathyroidism of renal origin: Secondary | ICD-10-CM | POA: Diagnosis not present

## 2016-07-08 ENCOUNTER — Ambulatory Visit (HOSPITAL_COMMUNITY)
Admission: RE | Admit: 2016-07-08 | Discharge: 2016-07-08 | Disposition: A | Discharge status: Home / Self Care | Payer: Medicare Other | Source: Ambulatory Visit | Attending: Cardiovascular Disease | Admitting: Cardiovascular Disease

## 2016-07-08 DIAGNOSIS — Z8249 Family history of ischemic heart disease and other diseases of the circulatory system: Secondary | ICD-10-CM | POA: Insufficient documentation

## 2016-07-08 DIAGNOSIS — Z87891 Personal history of nicotine dependence: Secondary | ICD-10-CM | POA: Insufficient documentation

## 2016-07-08 DIAGNOSIS — I10 Essential (primary) hypertension: Secondary | ICD-10-CM | POA: Diagnosis not present

## 2016-07-08 DIAGNOSIS — Z8673 Personal history of transient ischemic attack (TIA), and cerebral infarction without residual deficits: Secondary | ICD-10-CM | POA: Diagnosis not present

## 2016-07-08 DIAGNOSIS — I350 Nonrheumatic aortic (valve) stenosis: Secondary | ICD-10-CM | POA: Diagnosis not present

## 2016-07-08 DIAGNOSIS — I480 Paroxysmal atrial fibrillation: Secondary | ICD-10-CM | POA: Insufficient documentation

## 2016-07-08 DIAGNOSIS — R079 Chest pain, unspecified: Secondary | ICD-10-CM

## 2016-07-08 LAB — MYOCARDIAL PERFUSION IMAGING
CHL CUP NUCLEAR SDS: 2
LV dias vol: 73 mL (ref 62–150)
LVSYSVOL: 31 mL
NUC STRESS TID: 1.01
Peak HR: 103 {beats}/min
Rest HR: 86 {beats}/min
SRS: 6
SSS: 8

## 2016-07-08 MED ORDER — AMINOPHYLLINE 25 MG/ML IV SOLN
75.0000 mg | Freq: Once | INTRAVENOUS | Status: AC
  Administered 2016-07-08: 75 mg via INTRAVENOUS

## 2016-07-08 MED ORDER — TECHNETIUM TC 99M TETROFOSMIN IV KIT
31.6000 | PACK | Freq: Once | INTRAVENOUS | Status: AC | PRN
  Administered 2016-07-08: 31.6 via INTRAVENOUS
  Filled 2016-07-08: qty 32

## 2016-07-08 MED ORDER — TECHNETIUM TC 99M TETROFOSMIN IV KIT
10.5000 | PACK | Freq: Once | INTRAVENOUS | Status: AC | PRN
  Administered 2016-07-08: 10.5 via INTRAVENOUS
  Filled 2016-07-08: qty 11

## 2016-07-08 MED ORDER — REGADENOSON 0.4 MG/5ML IV SOLN
0.4000 mg | Freq: Once | INTRAVENOUS | Status: AC
  Administered 2016-07-08: 0.4 mg via INTRAVENOUS

## 2016-07-09 DIAGNOSIS — D631 Anemia in chronic kidney disease: Secondary | ICD-10-CM | POA: Diagnosis not present

## 2016-07-09 DIAGNOSIS — N2581 Secondary hyperparathyroidism of renal origin: Secondary | ICD-10-CM | POA: Diagnosis not present

## 2016-07-09 DIAGNOSIS — E1129 Type 2 diabetes mellitus with other diabetic kidney complication: Secondary | ICD-10-CM | POA: Diagnosis not present

## 2016-07-09 DIAGNOSIS — N186 End stage renal disease: Secondary | ICD-10-CM | POA: Diagnosis not present

## 2016-07-12 DIAGNOSIS — N186 End stage renal disease: Secondary | ICD-10-CM | POA: Diagnosis not present

## 2016-07-12 DIAGNOSIS — N2581 Secondary hyperparathyroidism of renal origin: Secondary | ICD-10-CM | POA: Diagnosis not present

## 2016-07-14 DIAGNOSIS — N2581 Secondary hyperparathyroidism of renal origin: Secondary | ICD-10-CM | POA: Diagnosis not present

## 2016-07-14 DIAGNOSIS — N186 End stage renal disease: Secondary | ICD-10-CM | POA: Diagnosis not present

## 2016-07-16 DIAGNOSIS — N2581 Secondary hyperparathyroidism of renal origin: Secondary | ICD-10-CM | POA: Diagnosis not present

## 2016-07-16 DIAGNOSIS — D631 Anemia in chronic kidney disease: Secondary | ICD-10-CM | POA: Diagnosis not present

## 2016-07-16 DIAGNOSIS — E1129 Type 2 diabetes mellitus with other diabetic kidney complication: Secondary | ICD-10-CM | POA: Diagnosis not present

## 2016-07-16 DIAGNOSIS — N186 End stage renal disease: Secondary | ICD-10-CM | POA: Diagnosis not present

## 2016-07-19 ENCOUNTER — Ambulatory Visit (INDEPENDENT_AMBULATORY_CARE_PROVIDER_SITE_OTHER): Payer: Medicare Other | Admitting: Pharmacist Clinician (PhC)/ Clinical Pharmacy Specialist

## 2016-07-19 DIAGNOSIS — I480 Paroxysmal atrial fibrillation: Secondary | ICD-10-CM | POA: Diagnosis not present

## 2016-07-19 DIAGNOSIS — E1129 Type 2 diabetes mellitus with other diabetic kidney complication: Secondary | ICD-10-CM | POA: Diagnosis not present

## 2016-07-19 DIAGNOSIS — Z7901 Long term (current) use of anticoagulants: Secondary | ICD-10-CM | POA: Diagnosis not present

## 2016-07-19 DIAGNOSIS — N2581 Secondary hyperparathyroidism of renal origin: Secondary | ICD-10-CM | POA: Diagnosis not present

## 2016-07-19 DIAGNOSIS — N186 End stage renal disease: Secondary | ICD-10-CM | POA: Diagnosis not present

## 2016-07-19 DIAGNOSIS — D631 Anemia in chronic kidney disease: Secondary | ICD-10-CM | POA: Diagnosis not present

## 2016-07-19 LAB — POCT INR: INR: 4.4

## 2016-07-21 DIAGNOSIS — D631 Anemia in chronic kidney disease: Secondary | ICD-10-CM | POA: Diagnosis not present

## 2016-07-21 DIAGNOSIS — N2581 Secondary hyperparathyroidism of renal origin: Secondary | ICD-10-CM | POA: Diagnosis not present

## 2016-07-21 DIAGNOSIS — E1129 Type 2 diabetes mellitus with other diabetic kidney complication: Secondary | ICD-10-CM | POA: Diagnosis not present

## 2016-07-21 DIAGNOSIS — I482 Chronic atrial fibrillation: Secondary | ICD-10-CM | POA: Diagnosis not present

## 2016-07-21 DIAGNOSIS — N186 End stage renal disease: Secondary | ICD-10-CM | POA: Diagnosis not present

## 2016-07-21 LAB — PROTIME-INR: INR: 2.2 — AB (ref 0.9–1.1)

## 2016-07-22 ENCOUNTER — Other Ambulatory Visit: Payer: Self-pay | Admitting: Cardiovascular Disease

## 2016-07-22 ENCOUNTER — Ambulatory Visit (HOSPITAL_BASED_OUTPATIENT_CLINIC_OR_DEPARTMENT_OTHER)
Admission: RE | Admit: 2016-07-22 | Discharge: 2016-07-22 | Disposition: A | Discharge status: Home / Self Care | Payer: Medicare Other | Source: Ambulatory Visit | Attending: Cardiovascular Disease | Admitting: Cardiovascular Disease

## 2016-07-22 ENCOUNTER — Encounter (HOSPITAL_COMMUNITY)
Admission: RE | Disposition: A | Discharge status: Home / Self Care | Payer: Self-pay | Source: Ambulatory Visit | Attending: Cardiovascular Disease

## 2016-07-22 ENCOUNTER — Encounter (HOSPITAL_COMMUNITY): Payer: Self-pay | Admitting: Cardiovascular Disease

## 2016-07-22 ENCOUNTER — Ambulatory Visit (HOSPITAL_COMMUNITY)
Admission: RE | Admit: 2016-07-22 | Discharge: 2016-07-22 | Disposition: A | Discharge status: Home / Self Care | Payer: Medicare Other | Source: Ambulatory Visit | Attending: Cardiovascular Disease | Admitting: Cardiovascular Disease

## 2016-07-22 DIAGNOSIS — I350 Nonrheumatic aortic (valve) stenosis: Secondary | ICD-10-CM | POA: Diagnosis not present

## 2016-07-22 DIAGNOSIS — K219 Gastro-esophageal reflux disease without esophagitis: Secondary | ICD-10-CM | POA: Insufficient documentation

## 2016-07-22 DIAGNOSIS — I272 Pulmonary hypertension, unspecified: Secondary | ICD-10-CM | POA: Diagnosis not present

## 2016-07-22 DIAGNOSIS — Z992 Dependence on renal dialysis: Secondary | ICD-10-CM | POA: Insufficient documentation

## 2016-07-22 DIAGNOSIS — Z87891 Personal history of nicotine dependence: Secondary | ICD-10-CM | POA: Diagnosis not present

## 2016-07-22 DIAGNOSIS — I120 Hypertensive chronic kidney disease with stage 5 chronic kidney disease or end stage renal disease: Secondary | ICD-10-CM | POA: Diagnosis not present

## 2016-07-22 DIAGNOSIS — Z8711 Personal history of peptic ulcer disease: Secondary | ICD-10-CM | POA: Diagnosis not present

## 2016-07-22 DIAGNOSIS — I480 Paroxysmal atrial fibrillation: Secondary | ICD-10-CM | POA: Diagnosis not present

## 2016-07-22 DIAGNOSIS — Z7952 Long term (current) use of systemic steroids: Secondary | ICD-10-CM | POA: Insufficient documentation

## 2016-07-22 DIAGNOSIS — E785 Hyperlipidemia, unspecified: Secondary | ICD-10-CM | POA: Insufficient documentation

## 2016-07-22 DIAGNOSIS — Z8673 Personal history of transient ischemic attack (TIA), and cerebral infarction without residual deficits: Secondary | ICD-10-CM | POA: Diagnosis not present

## 2016-07-22 DIAGNOSIS — I4891 Unspecified atrial fibrillation: Secondary | ICD-10-CM

## 2016-07-22 DIAGNOSIS — Z8249 Family history of ischemic heart disease and other diseases of the circulatory system: Secondary | ICD-10-CM | POA: Insufficient documentation

## 2016-07-22 DIAGNOSIS — N186 End stage renal disease: Secondary | ICD-10-CM | POA: Diagnosis not present

## 2016-07-22 DIAGNOSIS — R55 Syncope and collapse: Secondary | ICD-10-CM | POA: Diagnosis not present

## 2016-07-22 DIAGNOSIS — E1122 Type 2 diabetes mellitus with diabetic chronic kidney disease: Secondary | ICD-10-CM | POA: Diagnosis not present

## 2016-07-22 DIAGNOSIS — Z880 Allergy status to penicillin: Secondary | ICD-10-CM | POA: Diagnosis not present

## 2016-07-22 DIAGNOSIS — Z7901 Long term (current) use of anticoagulants: Secondary | ICD-10-CM | POA: Insufficient documentation

## 2016-07-22 DIAGNOSIS — I959 Hypotension, unspecified: Secondary | ICD-10-CM | POA: Insufficient documentation

## 2016-07-22 DIAGNOSIS — F329 Major depressive disorder, single episode, unspecified: Secondary | ICD-10-CM | POA: Insufficient documentation

## 2016-07-22 DIAGNOSIS — E1142 Type 2 diabetes mellitus with diabetic polyneuropathy: Secondary | ICD-10-CM | POA: Diagnosis not present

## 2016-07-22 HISTORY — PX: TEE WITHOUT CARDIOVERSION: SHX5443

## 2016-07-22 SURGERY — ECHOCARDIOGRAM, TRANSESOPHAGEAL
Anesthesia: Moderate Sedation

## 2016-07-22 MED ORDER — FENTANYL CITRATE (PF) 100 MCG/2ML IJ SOLN
INTRAMUSCULAR | Status: DC | PRN
  Administered 2016-07-22: 25 ug via INTRAVENOUS

## 2016-07-22 MED ORDER — FENTANYL CITRATE (PF) 100 MCG/2ML IJ SOLN
INTRAMUSCULAR | Status: AC
  Filled 2016-07-22: qty 2

## 2016-07-22 MED ORDER — MIDAZOLAM HCL 5 MG/ML IJ SOLN
INTRAMUSCULAR | Status: AC
  Filled 2016-07-22: qty 2

## 2016-07-22 MED ORDER — BUTAMBEN-TETRACAINE-BENZOCAINE 2-2-14 % EX AERO
INHALATION_SPRAY | CUTANEOUS | Status: DC | PRN
  Administered 2016-07-22: 2 via TOPICAL

## 2016-07-22 MED ORDER — MIDAZOLAM HCL 10 MG/2ML IJ SOLN
INTRAMUSCULAR | Status: DC | PRN
  Administered 2016-07-22: 2 mg via INTRAVENOUS
  Administered 2016-07-22: 1 mg via INTRAVENOUS

## 2016-07-22 MED ORDER — SODIUM CHLORIDE 0.9 % IV SOLN
INTRAVENOUS | Status: DC
  Administered 2016-07-22: 500 mL via INTRAVENOUS

## 2016-07-22 NOTE — Discharge Instructions (Signed)

## 2016-07-22 NOTE — CV Procedure (Signed)
Brief TEE Note  LVEF 55-60% Moderate to severe aortic stenosis Moderate MR Mild TR No LA/LAA thrombus.  Smoke noted in the appendage  During this procedure the patient is administered a total of Versed 3 mg and Fentanyl 25 mcg to achieve and maintain moderate conscious sedation.  The patient's heart rate, blood pressure, and oxygen saturation are monitored continuously during the procedure. The period of conscious sedation is 36 minutes, of which I was present face-to-face 100% of this time.  For additional details see full report.  Dequavion Follette C. Oval Linsey, MD, [facility] 07/22/2016 10:49 AM

## 2016-07-22 NOTE — Interval H&P Note (Signed)
History and Physical Interval Note:  07/22/2016 10:09 AM  Alexander [name]  has presented today for surgery, with the diagnosis of AORTIC VALVE STENOSIS  The various methods of treatment have been discussed with the patient and family. After consideration of risks, benefits and other options for treatment, the patient has consented to  Procedure(s): TRANSESOPHAGEAL ECHOCARDIOGRAM (TEE) (N/A) as a surgical intervention .  The patient's history has been reviewed, patient examined, no change in status, stable for surgery.  I have reviewed the patient's chart and labs.  Questions were answered to the patient's satisfaction.     Skeet Latch, MD

## 2016-07-22 NOTE — H&P (View-Only) (Signed)
Cardiology Office Note   Date:  07/06/2016   ID:  Alexander [name], DOB Mar 24, 1941, MRN [id number]  PCP:  Horatio Pel, MD  Cardiologist:   Skeet Latch, MD  Electrophysiologist: Allegra Lai, MD  Chief Complaint  Patient presents with  . Follow-up    echo  . Shortness of Breath    frequently  . Edema    ankles.      History of Present Illness: Alexander [name] is a 76 y.o. male with moderate aortic stenosis, ESRD on HD, diabetes mellitus, hyperlipidemia, carotid artery disease with complete occlusion of the L carotid, prior TIA and paroxysmal atrial fibrillation  who presents for follow up on hypotension.  He was initially evaluated on 12/04/14 with hypotension requiring midodrine and syncope.  He had an echo that revealed LVEF 60-65% with grade 2 diastolic dysfunction, moderate AS and mild MR. He wore a 48 hour Holter 11/2014 that revealed episodes of dizziness associated with heart rates in the 50s. He had a PPM implanted 01/2015.  He had a repeat monitor 12/2014 that showed atrial fibrillation with occasional junctional escape.  He subsequently had a Lexiscan Cardiolite 01/2015 that was negative for ischemia but showed an area of distal inferior infarct vs artifact.  At his last appointment midodrine was increased to 15 mg tid.  He saw Dr. Curt Bears on 10/06/15 where he was found to be in atrial fibrillation since April.  They made a plan to restart amiodarone and schedule DCCV after 3 weeks of therapeutic INR.  Since his last appointment [name] has been feeling short of breath.  He denies palpitations but only mild dizziness.  He endorses mild lower extremity edema but denies orthopnea or PND.  His daughter notes that he has been much more fatigued lately.  He hasn't been exerting himself much lately but does get short of breath and has chest tightness with exertion.  He had an echo 06/21/16 that revealed LVEF 60-65% and moderate aortic stenosis with a mean gradient of 33  mmHg.    Past Medical History:  Diagnosis Date  . Anemia   . Aortic stenosis 06/15/12   TEE - EF 94-49%; grade 1 diastolic dysfunction; mild/mod aortic valve stenosis; Mitral valve had calcified annulus, mild pulm htn PA peak pressure 43mmHg  . Barrett's esophagus 05/2003  . Bradycardia 2017   [employer] 2240 Assurity dual-lead pacemaker  . Carpal tunnel syndrome, bilateral 11/03/2015  . Colon polyps   . CVA (cerebral infarction)    2004/affected left side  . Depression   . Diabetes mellitus without complication ([place])   . Diabetic peripheral neuropathy ([facility]) 10/02/2015  . Diverticulosis   . End stage renal disease ([place])    hemodialysis 3 times a week  . GERD (gastroesophageal reflux disease)   . Hyperlipidemia   . Hypertension   . Kidney stones   . Macular degeneration    both eyes  . Orthostatic hypotension 09/09/2015  . Paroxysmal atrial fibrillation ([facility])   . Peptic ulcer    bleeding, 1969  . S/P epidural steroid injection    last  injection over 10 years ago  . Seasonal allergies   . Tubular adenoma of colon 07/2001    Past Surgical History:  Procedure Laterality Date  . AV FISTULA PLACEMENT  2009  . BACK SURGERY    . CARDIOVERSION N/A 11/13/2015   Procedure: CARDIOVERSION;  Surgeon: Troy Sine, MD;  Location: [facility];  Service: Cardiovascular;  Laterality: N/A;  .  CARDIOVERSION N/A 01/13/2016   Procedure: CARDIOVERSION;  Surgeon: Will Meredith [name], MD;  Location: [facility];  Service: Cardiovascular;  Laterality: N/A;  . [place]   right eye  . CYSTOSCOPY  several times   kidney stones  . EP IMPLANTABLE DEVICE N/A 03/11/2015   Procedure: Pacemaker Implant;  Surgeon: Will Meredith [name], MD;  [place];  Laterality: Left  . LAMINECTOMY  1969  . TONSILLECTOMY  1964     Current Outpatient Prescriptions  Medication Sig Dispense Refill  . acetaminophen (TYLENOL) 500 MG tablet Take 1,000 mg by mouth every 6 (six)  hours as needed for pain.    [name] atorvastatin (LIPITOR) 40 MG tablet Take 20 mg by mouth daily.    [name] b complex-vitamin c-folic acid (NEPHRO-VITE) 0.8 MG TABS Take 1 tablet by mouth daily.     . bromocriptine (PARLODEL) 5 MG capsule Take 5 mg by mouth at bedtime.     . calcium acetate (PHOSLO) 667 MG capsule Take 667 mg by mouth 4 (four) times daily.     . cefdinir (OMNICEF) 300 MG capsule Take 1 capsule (300 mg total) by mouth every other day. Take after dialysis 4 capsule 0  . cetirizine (ZYRTEC) 10 MG tablet Take 10 mg by mouth at bedtime.     . dorzolamide-timolol (COSOPT) 22.3-6.8 MG/ML ophthalmic solution Place 1 drop into the right eye 2 (two) times daily.    [name] FLUoxetine (PROZAC) 20 MG capsule Take 40 mg by mouth daily.     . metoprolol tartrate (LOPRESSOR) 25 MG tablet TAKE 1 TABLET BY MOUTH TWICE DAILY 180 tablet 3  . midodrine (PROAMATINE) 5 MG tablet Take 3 tablets (15 mg total) by mouth 3 (three) times daily as needed (for systolic blood pressure less than 130). (Patient taking differently: Take 5 mg by mouth 3 (three) times daily as needed (for systolic blood pressure less than 130). ) 270 tablet 3  . omeprazole (PRILOSEC) 20 MG capsule Take 20 mg by mouth daily.    . predniSONE (DELTASONE) 5 MG tablet Take 1 tablet by mouth daily.  1  . SENSIPAR 30 MG tablet Take 1 tablet by mouth daily.  5  . warfarin (COUMADIN) 5 MG tablet Take 1-1.5 tablets by mouth daily as directed by coumadin clinic 120 tablet 1   No current facility-administered medications for this visit.     Allergies:   Codeine; Penicillins; and Tramadol    Social History:  The patient  reports that he quit smoking about 18 years ago. He has quit using smokeless tobacco. He reports that he does not drink alcohol or use drugs.   Family History:  The patient's family history includes Cancer in his brother; Heart disease in his sister; Hypertension in his father; Stomach cancer in his mother; Stroke in his sister.     ROS:  Please see the history of present illness.   Otherwise, review of systems are positive for none.   All other systems are reviewed and negative.    PHYSICAL EXAM: VS:  BP (!) 144/89   Pulse (!) 104   Ht 5' 10.5" (1.791 m)   Wt 95.3 kg (210 lb 3.2 oz)   BMI 29.73 kg/m  , BMI Body mass index is 29.73 kg/m.  GENERAL:  Tired appearing HEENT:  Pupils equal round and reactive, fundi not visualized, oral mucosa unremarkable NECK:  No jugular venous distention, waveform within normal limits, carotid upstroke brisk and symmetric, no bruits LYMPHATICS:  No cervical adenopathy LUNGS:  Clear to auscultation bilaterally HEART:  Irregularly irregular.  PMI not displaced or sustained,S1 and S2 within normal limits, no S3, no S4, no clicks, no rubs, III/VI late-peaking crescendo-decrescendo murmur at the LUSB. ABD:  Flat, positive bowel sounds normal in frequency in pitch, no bruits, no rebound, no guarding, no midline pulsatile mass, no hepatomegaly, no splenomegaly EXT:  2 plus pulses throughout, no edema, no cyanosis no clubbing SKIN:  No rashes no nodules NEURO:  Cranial nerves II through XII grossly intact, motor grossly intact throughout PSYCH:  Cognitively intact, oriented to person place and time   EKG:  EKG is not ordered today. 09/09/15: Atrial fibrillation rate 108 bpm.  Prior inferior infarct. 01/16/15: Sinus bradycardia.  First degree heart block.  LAFB.  U waves. The ekg from 11/19/14 demonstrates sinus bradycardia at 49 bpm. First degree heart block.   Lexiscan Cardiolite 01/28/15:  Nuclear stress EF: 67%.  The left ventricular ejection fraction is hyperdynamic (>65%).  There was no ST segment deviation noted during stress.  This is a low risk study.  Low risk stress nuclear study with a small, severe, predominantly fixed distal inferior defect consistent with small prior infarct vs thinning; no significant ischemia; EF 67 with normal wall motion.  11/28/14 TTE:   Study Conclusions  - Left ventricle: The cavity size was normal. Systolic function was normal. The estimated ejection fraction was in the range of 60% to 65%. Wall motion was normal; there were no regional wall motion abnormalities. Features are consistent with a pseudonormal left ventricular filling pattern, with concomitant abnormal relaxation and increased filling pressure (grade 2 diastolic dysfunction). Doppler parameters are consistent with high ventricular filling pressure. - Aortic valve: Severe diffuse thickening and calcification. There was moderate stenosis.  VTI ratio of LVOT to aortic valve: 0.31. Valve area (VTI): 1.27 cm^2. Indexed valve area (VTI): 0.59 cm^2/m^2. Mean velocity ratio of LVOT to aortic valve: 0.31. Valve area (Vmean): 1.29 cm^2. Indexed valve area (Vmean): 0.6 cm^2/m^2.  Mean gradient (S): 22 mm Hg. - Mitral valve: Severely calcified annulus. There was mild regurgitation. - Left atrium: The atrium was mildly dilated. - Right ventricle: The cavity size was mildly dilated. Wall thickness was normal. - Tricuspid valve: There was trivial regurgitation. - Pulmonic valve: There was trivial regurgitation. - Pulmonary arteries: PA peak pressure: 37 mm Hg (S).  Echo 06/21/16: Study Conclusions  - Left ventricle: The cavity size was normal. Wall thickness was   increased in a pattern of mild LVH. Systolic function was normal.   The estimated ejection fraction was in the range of 60% to 65%.   Indeterminant diastolic function (atrial fibrillation). Wall   motion was normal; there were no regional wall motion   abnormalities. - Aortic valve: Trileaflet; severely calcified leaflets. There was   moderate to severe stenosis. There was trivial regurgitation.   Mean gradient (S): 33 mm Hg. Peak gradient (S): 61 mm Hg. Valve   area (VTI): 0.81 cm^2. - Aorta: Ascending aortic diameter: 37 mm (S). - Ascending aorta: The ascending aorta was  borderline dilated. - Mitral valve: Moderately calcified annulus. There was mild   regurgitation. - Left atrium: The atrium was moderately dilated. - Right ventricle: The cavity size was normal. Systolic function   was normal. - Right atrium: The atrium was moderately dilated. - Tricuspid valve: Peak RV-RA gradient (S): 35 mm Hg. - Pulmonary arteries: PA peak pressure: 43 mm Hg (S). - Systemic veins: IVC measured 2.0 cm with <  50% respirophasic   variation, suggesting RA pressure 8 mmHg.  Recent Labs: 10/04/2015: ALT 20 01/02/2016: BUN 15; Creat 3.61; Platelets 257 01/13/2016: Hemoglobin 10.5; Potassium 5.6; Sodium 138    Lipid Panel    Component Value Date/Time   CHOL 138 10/28/2015 1008   TRIG 158 (H) 10/28/2015 1008   HDL 30 (L) 10/28/2015 1008   CHOLHDL 4.6 10/28/2015 1008   VLDL 32 (H) 10/28/2015 1008   LDLCALC 76 10/28/2015 1008      Wt Readings from Last 3 Encounters:  07/05/16 95.3 kg (210 lb 3.2 oz)  06/22/16 96.2 kg (212 lb)  05/04/16 96.6 kg (213 lb)      Other studies Reviewed: Additional studies/ records that were reviewed today include: [facility] records Review of the above records demonstrates:  Please see elsewhere in the note.     ASSESSMENT AND PLAN:  # Hypotension/syncope: Mr. Goodson is doing much better and denies any recent episodes of pre-syncope.  Continue midodrine to 15 mg tid.    # Bradycardia/atrial fibrillation: Mr. Hopping had a cardioversion but only went into sinus rhythm temporarily.  He is currently in atrial fibrillation with poorly-controlled rates.  Given his hypotension we will not increase his nodal agents at this time. Continue warfarin for anticoagulation.  If his TEE doesn't show severe AS we will try increasing metoprolol.  # Moderate aortic stenosis:  # Fatigue: # Shortness of breath: Mr. Banks aortic stenosis is moderate and has increased significantly in the last year.  Visually his aortic valve appears significantly  stenotic.  It is unclear how much this is contributing.  We will obtain a TEE to better assess.  We will also get a Lexiscan Myoview to evaluate for ischemia given his chest pressure.  # Hyperlipidemia: LDL 76 10/2015.  Continue atorvastatin.  Current medicines are reviewed at length with the patient today.  The patient does not have concerns regarding medicines.  The following changes have been made: None Labs/ tests ordered today include:   Orders Placed This Encounter  Procedures  . Myocardial Perfusion Imaging     Disposition:   FU with Dr. Jonelle Sidle C. Fletcher in 1 month   Signed, Skeet Latch, MD  07/06/2016 10:36 PM    [facility]

## 2016-07-23 ENCOUNTER — Encounter (HOSPITAL_COMMUNITY): Payer: Self-pay | Admitting: Cardiovascular Disease

## 2016-07-23 DIAGNOSIS — E1129 Type 2 diabetes mellitus with other diabetic kidney complication: Secondary | ICD-10-CM | POA: Diagnosis not present

## 2016-07-23 DIAGNOSIS — D631 Anemia in chronic kidney disease: Secondary | ICD-10-CM | POA: Diagnosis not present

## 2016-07-23 DIAGNOSIS — N186 End stage renal disease: Secondary | ICD-10-CM | POA: Diagnosis not present

## 2016-07-23 DIAGNOSIS — N2581 Secondary hyperparathyroidism of renal origin: Secondary | ICD-10-CM | POA: Diagnosis not present

## 2016-07-26 DIAGNOSIS — Z992 Dependence on renal dialysis: Secondary | ICD-10-CM | POA: Diagnosis not present

## 2016-07-26 DIAGNOSIS — N2581 Secondary hyperparathyroidism of renal origin: Secondary | ICD-10-CM | POA: Diagnosis not present

## 2016-07-26 DIAGNOSIS — D631 Anemia in chronic kidney disease: Secondary | ICD-10-CM | POA: Diagnosis not present

## 2016-07-26 DIAGNOSIS — N186 End stage renal disease: Secondary | ICD-10-CM | POA: Diagnosis not present

## 2016-07-26 DIAGNOSIS — E1129 Type 2 diabetes mellitus with other diabetic kidney complication: Secondary | ICD-10-CM | POA: Diagnosis not present

## 2016-07-28 DIAGNOSIS — E1129 Type 2 diabetes mellitus with other diabetic kidney complication: Secondary | ICD-10-CM | POA: Diagnosis not present

## 2016-07-28 DIAGNOSIS — D631 Anemia in chronic kidney disease: Secondary | ICD-10-CM | POA: Diagnosis not present

## 2016-07-28 DIAGNOSIS — N2581 Secondary hyperparathyroidism of renal origin: Secondary | ICD-10-CM | POA: Diagnosis not present

## 2016-07-28 DIAGNOSIS — N186 End stage renal disease: Secondary | ICD-10-CM | POA: Diagnosis not present

## 2016-07-30 ENCOUNTER — Ambulatory Visit (INDEPENDENT_AMBULATORY_CARE_PROVIDER_SITE_OTHER): Payer: Medicare Other | Admitting: Pharmacist Clinician (PhC)/ Clinical Pharmacy Specialist

## 2016-07-30 DIAGNOSIS — Z7901 Long term (current) use of anticoagulants: Secondary | ICD-10-CM

## 2016-07-30 DIAGNOSIS — D631 Anemia in chronic kidney disease: Secondary | ICD-10-CM | POA: Diagnosis not present

## 2016-07-30 DIAGNOSIS — E1129 Type 2 diabetes mellitus with other diabetic kidney complication: Secondary | ICD-10-CM | POA: Diagnosis not present

## 2016-07-30 DIAGNOSIS — I480 Paroxysmal atrial fibrillation: Secondary | ICD-10-CM | POA: Diagnosis not present

## 2016-07-30 DIAGNOSIS — N2581 Secondary hyperparathyroidism of renal origin: Secondary | ICD-10-CM | POA: Diagnosis not present

## 2016-07-30 DIAGNOSIS — N186 End stage renal disease: Secondary | ICD-10-CM | POA: Diagnosis not present

## 2016-07-30 LAB — POCT INR: INR: 2.7

## 2016-08-02 ENCOUNTER — Telehealth: Payer: Self-pay | Admitting: Cardiovascular Disease

## 2016-08-02 DIAGNOSIS — D631 Anemia in chronic kidney disease: Secondary | ICD-10-CM | POA: Diagnosis not present

## 2016-08-02 DIAGNOSIS — E1129 Type 2 diabetes mellitus with other diabetic kidney complication: Secondary | ICD-10-CM | POA: Diagnosis not present

## 2016-08-02 DIAGNOSIS — N186 End stage renal disease: Secondary | ICD-10-CM | POA: Diagnosis not present

## 2016-08-02 DIAGNOSIS — N2581 Secondary hyperparathyroidism of renal origin: Secondary | ICD-10-CM | POA: Diagnosis not present

## 2016-08-02 NOTE — Telephone Encounter (Signed)
New Message     Pt is returning [place] call about echo results

## 2016-08-02 NOTE — Telephone Encounter (Signed)
Advised wife of results  Spoke with Alexander Mainland RN and patient scheduled for Monday 5/14 at 2 pm with Dr Angelena Form  Advised wife of location, date, and time

## 2016-08-02 NOTE — Telephone Encounter (Signed)
-----   Message from Skeet Latch, MD sent at 07/30/2016  5:12 PM EDT ----- Reviewed this echo and the echo from before. I think his aortic stenosis is severe.  Please refer to Dr. Angelena Form for TAVR.

## 2016-08-03 ENCOUNTER — Ambulatory Visit (INDEPENDENT_AMBULATORY_CARE_PROVIDER_SITE_OTHER): Payer: Medicare Other | Admitting: *Deleted

## 2016-08-03 DIAGNOSIS — I495 Sick sinus syndrome: Secondary | ICD-10-CM

## 2016-08-03 NOTE — Progress Notes (Signed)
Remote pacemaker transmission.   

## 2016-08-04 DIAGNOSIS — D631 Anemia in chronic kidney disease: Secondary | ICD-10-CM | POA: Diagnosis not present

## 2016-08-04 DIAGNOSIS — E1129 Type 2 diabetes mellitus with other diabetic kidney complication: Secondary | ICD-10-CM | POA: Diagnosis not present

## 2016-08-04 DIAGNOSIS — N2581 Secondary hyperparathyroidism of renal origin: Secondary | ICD-10-CM | POA: Diagnosis not present

## 2016-08-04 DIAGNOSIS — N186 End stage renal disease: Secondary | ICD-10-CM | POA: Diagnosis not present

## 2016-08-05 ENCOUNTER — Encounter: Payer: Self-pay | Admitting: Cardiovascular Disease

## 2016-08-05 LAB — CUP PACEART REMOTE DEVICE CHECK
Battery Remaining Percentage: 95.5 %
Battery Voltage: 3.01 V
Brady Statistic AS VS Percent: 23 %
Brady Statistic RA Percent Paced: 1 %
Date Time Interrogation Session: 20180508060015
Implantable Lead Implant Date: 20161213
Implantable Lead Implant Date: 20161213
Implantable Lead Location: 753859
Implantable Lead Location: 753860
Implantable Pulse Generator Implant Date: 20161213
Lead Channel Pacing Threshold Amplitude: 0.75 V
Lead Channel Pacing Threshold Pulse Width: 0.5 ms
Lead Channel Pacing Threshold Pulse Width: 0.5 ms
Lead Channel Sensing Intrinsic Amplitude: 1 mV
Lead Channel Setting Pacing Amplitude: 2.5 V
Lead Channel Setting Sensing Sensitivity: 2 mV
MDC IDC MSMT BATTERY REMAINING LONGEVITY: 115 mo
MDC IDC MSMT LEADCHNL RA IMPEDANCE VALUE: 360 Ohm
MDC IDC MSMT LEADCHNL RV IMPEDANCE VALUE: 450 Ohm
MDC IDC MSMT LEADCHNL RV PACING THRESHOLD AMPLITUDE: 0.75 V
MDC IDC MSMT LEADCHNL RV SENSING INTR AMPL: 10.8 mV
MDC IDC PG SERIAL: 7839215
MDC IDC SET LEADCHNL RA PACING AMPLITUDE: 1.75 V
MDC IDC SET LEADCHNL RV PACING PULSEWIDTH: 0.5 ms
MDC IDC STAT BRADY AP VP PERCENT: 0 %
MDC IDC STAT BRADY AP VS PERCENT: 0 %
MDC IDC STAT BRADY AS VP PERCENT: 77 %
MDC IDC STAT BRADY RV PERCENT PACED: 21 %

## 2016-08-06 ENCOUNTER — Ambulatory Visit (INDEPENDENT_AMBULATORY_CARE_PROVIDER_SITE_OTHER): Payer: Medicare Other | Admitting: Pharmacist Clinician (PhC)/ Clinical Pharmacy Specialist

## 2016-08-06 ENCOUNTER — Encounter: Payer: Self-pay | Admitting: Cardiovascular Disease

## 2016-08-06 ENCOUNTER — Ambulatory Visit (INDEPENDENT_AMBULATORY_CARE_PROVIDER_SITE_OTHER): Payer: Medicare Other | Admitting: Cardiovascular Disease

## 2016-08-06 VITALS — BP 116/64 | HR 82 | Ht 70.5 in | Wt 209.4 lb

## 2016-08-06 DIAGNOSIS — I4819 Other persistent atrial fibrillation: Secondary | ICD-10-CM

## 2016-08-06 DIAGNOSIS — D631 Anemia in chronic kidney disease: Secondary | ICD-10-CM | POA: Diagnosis not present

## 2016-08-06 DIAGNOSIS — I481 Persistent atrial fibrillation: Secondary | ICD-10-CM

## 2016-08-06 DIAGNOSIS — E7800 Pure hypercholesterolemia, unspecified: Secondary | ICD-10-CM | POA: Diagnosis not present

## 2016-08-06 DIAGNOSIS — I480 Paroxysmal atrial fibrillation: Secondary | ICD-10-CM | POA: Diagnosis not present

## 2016-08-06 DIAGNOSIS — I350 Nonrheumatic aortic (valve) stenosis: Secondary | ICD-10-CM

## 2016-08-06 DIAGNOSIS — Z7901 Long term (current) use of anticoagulants: Secondary | ICD-10-CM

## 2016-08-06 DIAGNOSIS — R0602 Shortness of breath: Secondary | ICD-10-CM | POA: Diagnosis not present

## 2016-08-06 DIAGNOSIS — E1129 Type 2 diabetes mellitus with other diabetic kidney complication: Secondary | ICD-10-CM | POA: Diagnosis not present

## 2016-08-06 DIAGNOSIS — N186 End stage renal disease: Secondary | ICD-10-CM | POA: Diagnosis not present

## 2016-08-06 DIAGNOSIS — N2581 Secondary hyperparathyroidism of renal origin: Secondary | ICD-10-CM | POA: Diagnosis not present

## 2016-08-06 DIAGNOSIS — I951 Orthostatic hypotension: Secondary | ICD-10-CM | POA: Diagnosis not present

## 2016-08-06 LAB — POCT INR: INR: 4.4

## 2016-08-06 NOTE — Patient Instructions (Signed)
Medication Instructions:  Your physician recommends that you continue on your current medications as directed. Please refer to the Current Medication list given to you today.  Labwork: none  Testing/Procedures: none  Follow-Up: Keep your appointment with Dr Angelena Form next week  Your physician recommends that you schedule a follow-up appointment in: 3 month ov  If you need a refill on your cardiac medications before your next appointment, please call your pharmacy.

## 2016-08-06 NOTE — Progress Notes (Signed)
Cardiology Office Note   Date:  08/07/2016   ID:  BRAELYN [name], DOB 1940/06/10, MRN [id number]  PCP:  Deland Pretty, MD  Cardiologist:   Skeet Latch, MD  Electrophysiologist: Allegra Lai, MD  Chief Complaint  Patient presents with  . Follow-up    pt c/o chest pain and SOB on minimal exertion; dizziness; swelling in legs; occasional leg cramping     History of Present Illness: Alexander [name] is a 76 y.o. male with severe aortic stenosis, ESRD on HD, hypotension on midodrine, diabetes mellitus, hyperlipidemia, carotid artery disease with complete occlusion of the L carotid, prior TIA and paroxysmal atrial fibrillation  who presents for follow up.  He was initially evaluated on 12/04/14 with hypotension requiring midodrine and syncope.  He had an echo that revealed LVEF 60-65% with grade 2 diastolic dysfunction, moderate AS and mild MR. He wore a 48 hour Holter 11/2014 that revealed episodes of dizziness associated with heart rates in the 50s. He had a PPM implanted 01/2015.  He had a repeat monitor 12/2014 that showed atrial fibrillation with occasional junctional escape.  He subsequently had a Lexiscan Cardiolite 01/2015 that was negative for ischemia but showed an area of distal inferior infarct vs artifact.  He saw Dr. Curt Bears on 10/06/15 where he was found to have been in atrial fibrillation since April.  He went into sinus rhythm briefly after DCCV.  Mr. Rudin had an echo 3/ 06/21/16 that revealed LVEF 60-65% and moderate aortic stenosis with a mean gradient of 33 mmHg.  He continued to complain of fatigue and dyspnea, so he had a TEE 07/22/16 that revealed normal systolic function and an aortic valve that appeared severely stenoitic, though gradients indicated moderate aortic stenosis.  He continues to be very fatigued and short of breath.  He reports mild edema but denies orthopnea or PND.  He denies palpitations or recent syncope.  He has been completing full HD sessions without  complication.    Past Medical History:  Diagnosis Date  . Anemia   . Aortic stenosis 06/15/12   TEE - EF 67-34%; grade 1 diastolic dysfunction; mild/mod aortic valve stenosis; Mitral valve had calcified annulus, mild pulm htn PA peak pressure 25mmHg  . Barrett's esophagus 05/2003  . Bradycardia 2017   [employer] 2240 Assurity dual-lead pacemaker  . Carpal tunnel syndrome, bilateral 11/03/2015  . Colon polyps   . CVA (cerebral infarction)    2004/affected left side  . Depression   . Diabetes mellitus without complication ([place])   . Diabetic peripheral neuropathy ([place]) 10/02/2015  . Diverticulosis   . End stage renal disease ([place])    hemodialysis 3 times a week  . GERD (gastroesophageal reflux disease)   . Hyperlipidemia   . Hypertension   . Kidney stones   . Macular degeneration    both eyes  . Orthostatic hypotension 09/09/2015  . Paroxysmal atrial fibrillation (H[facility])   . Peptic ulcer    bleeding, 1969  . S/P epidural steroid injection    last  injection over 10 years ago  . Seasonal allergies   . Tubular adenoma of colon 07/2001    Past Surgical History:  Procedure Laterality Date  . AV FISTULA PLACEMENT  2009  . BACK SURGERY    . CARDIOVERSION N/A 11/13/2015   Procedure: CARDIOVERSION;  Surgeon: Troy Sine, MD;  Location: [facility] ENDOSCOPY;  Service: Cardiovascular;  Laterality: N/A;  . CARDIOVERSION N/A 01/13/2016   Procedure: CARDIOVERSION;  Surgeon: Will Hassell Done  Curt Bears, MD;  Location: [place];  Service: Cardiovascular;  Laterality: N/A;  . [place]   right eye  . CYSTOSCOPY  several times   kidney stones  . EP IMPLANTABLE DEVICE N/A 03/11/2015   Procedure: Pacemaker Implant;  Surgeon: Will Meredith [name], MD;  [place];  Laterality: Left  . LAMINECTOMY  1969  . TEE WITHOUT CARDIOVERSION N/A 07/22/2016   Procedure: TRANSESOPHAGEAL ECHOCARDIOGRAM (TEE);  Surgeon: Skeet Latch, MD;  Location: [facility] ENDOSCOPY;  Service:  Cardiovascular;  Laterality: N/A;  . TONSILLECTOMY  1964     Current Outpatient Prescriptions  Medication Sig Dispense Refill  . atorvastatin (LIPITOR) 40 MG tablet Take 20 mg by mouth daily.    [name] b complex-vitamin c-folic acid (NEPHRO-VITE) 0.8 MG TABS Take 1 tablet by mouth daily.     . bromocriptine (PARLODEL) 5 MG capsule Take 5 mg by mouth at bedtime.     . calcium acetate (PHOSLO) 667 MG capsule Take 667 mg by mouth 4 (four) times daily.     . cetirizine (ZYRTEC) 10 MG tablet Take 10 mg by mouth at bedtime.     . dorzolamide-timolol (COSOPT) 22.3-6.8 MG/ML ophthalmic solution Place 1 drop into the right eye 2 (two) times daily.    [name] FLUoxetine (PROZAC) 20 MG capsule Take 40 mg by mouth daily.     . metoprolol tartrate (LOPRESSOR) 25 MG tablet TAKE 1 TABLET BY MOUTH TWICE DAILY 180 tablet 3  . midodrine (PROAMATINE) 5 MG tablet Take 3 tablets (15 mg total) by mouth 3 (three) times daily as needed (for systolic blood pressure less than 130). (Patient taking differently: Take 5 mg by mouth 3 (three) times daily as needed (for systolic blood pressure less than 130). ) 270 tablet 3  . omeprazole (PRILOSEC) 20 MG capsule Take 20 mg by mouth daily.    . predniSONE (DELTASONE) 5 MG tablet Take 1 tablet by mouth daily.  1  . SENSIPAR 30 MG tablet Take 1 tablet by mouth daily.  5  . warfarin (COUMADIN) 5 MG tablet Take 1-1.5 tablets by mouth daily as directed by coumadin clinic 120 tablet 1   No current facility-administered medications for this visit.     Allergies:   Codeine; Penicillins; and Tramadol    Social History:  The patient  reports that he quit smoking about 18 years ago. He has quit using smokeless tobacco. He reports that he does not drink alcohol or use drugs.   Family History:  The patient's family history includes Cancer in his brother; Heart disease in his sister; Hypertension in his father; Stomach cancer in his mother; Stroke in his sister.    ROS:  Please see the  history of present illness.   Otherwise, review of systems are positive for none.   All other systems are reviewed and negative.    PHYSICAL EXAM: VS:  BP 116/64 (BP Location: Right Arm, Patient Position: Sitting, Cuff Size: Normal)   Pulse 82   Ht 5' 10.5" (1.791 m)   Wt 95 kg (209 lb 6.4 oz)   BMI 29.62 kg/m  , BMI Body mass index is 29.62 kg/m.  GENERAL:  Tired appearing.  No acute distress. HEENT:  Pupils equal round and reactive, fundi not visualized, oral mucosa unremarkable NECK:  No jugular venous distention, waveform within normal limits, carotid upstroke brisk and symmetric, no bruits LYMPHATICS:  No cervical adenopathy LUNGS:  Clear to auscultation bilaterally.  No crackles, wheezes or rhonchi. HEART:  Irregularly irregular.  PMI not displaced or sustained,S1 and S2 within normal limits, no S3, no S4, no clicks, no rubs, III/VI late-peaking crescendo-decrescendo murmur at the LUSB. ABD:  Flat, positive bowel sounds normal in frequency in pitch, no bruits, no rebound, no guarding, no midline pulsatile mass, no hepatomegaly, no splenomegaly EXT:  2 plus pulses throughout, trace edema, no cyanosis no clubbing SKIN:  No rashes no nodules NEURO:  Cranial nerves II through XII grossly intact, motor grossly intact throughout PSYCH:  Cognitively intact, oriented to person place and time   EKG:  EKG is ordered today. 09/09/15: Atrial fibrillation rate 108 bpm.  Prior inferior infarct. 01/16/15: Sinus bradycardia.  First degree heart block.  LAFB.  U waves. The ekg from 11/19/14 demonstrates sinus bradycardia at 49 bpm. First degree heart block.  08/06/16: Atrial fibrillation.  Rate 82 bpm.  Occcasional VP.  Lexiscan Cardiolite 01/28/15:  Nuclear stress EF: 67%.  The left ventricular ejection fraction is hyperdynamic (>65%).  There was no ST segment deviation noted during stress.  This is a low risk study.  Low risk stress nuclear study with a small, severe, predominantly fixed  distal inferior defect consistent with small prior infarct vs thinning; no significant ischemia; EF 67 with normal wall motion.  11/28/14 TTE:  Study Conclusions  - Left ventricle: The cavity size was normal. Systolic function was normal. The estimated ejection fraction was in the range of 60% to 65%. Wall motion was normal; there were no regional wall motion abnormalities. Features are consistent with a pseudonormal left ventricular filling pattern, with concomitant abnormal relaxation and increased filling pressure (grade 2 diastolic dysfunction). Doppler parameters are consistent with high ventricular filling pressure. - Aortic valve: Severe diffuse thickening and calcification. There was moderate stenosis.  VTI ratio of LVOT to aortic valve: 0.31. Valve area (VTI): 1.27 cm^2. Indexed valve area (VTI): 0.59 cm^2/m^2. Mean velocity ratio of LVOT to aortic valve: 0.31. Valve area (Vmean): 1.29 cm^2. Indexed valve area (Vmean): 0.6 cm^2/m^2.  Mean gradient (S): 22 mm Hg. - Mitral valve: Severely calcified annulus. There was mild regurgitation. - Left atrium: The atrium was mildly dilated. - Right ventricle: The cavity size was mildly dilated. Wall thickness was normal. - Tricuspid valve: There was trivial regurgitation. - Pulmonic valve: There was trivial regurgitation. - Pulmonary arteries: PA peak pressure: 37 mm Hg (S).  Echo 06/21/16: Study Conclusions  - Left ventricle: The cavity size was normal. Wall thickness was   increased in a pattern of mild LVH. Systolic function was normal.   The estimated ejection fraction was in the range of 60% to 65%.   Indeterminant diastolic function (atrial fibrillation). Wall   motion was normal; there were no regional wall motion   abnormalities. - Aortic valve: Trileaflet; severely calcified leaflets. There was   moderate to severe stenosis. There was trivial regurgitation.   Mean gradient (S): 33 mm Hg. Peak  gradient (S): 61 mm Hg. Valve   area (VTI): 0.81 cm^2. - Aorta: Ascending aortic diameter: 37 mm (S). - Ascending aorta: The ascending aorta was borderline dilated. - Mitral valve: Moderately calcified annulus. There was mild   regurgitation. - Left atrium: The atrium was moderately dilated. - Right ventricle: The cavity size was normal. Systolic function   was normal. - Right atrium: The atrium was moderately dilated. - Tricuspid valve: Peak RV-RA gradient (S): 35 mm Hg. - Pulmonary arteries: PA peak pressure: 43 mm Hg (S). - Systemic veins: IVC measured 2.0 cm with < 50%  respirophasic   variation, suggesting RA pressure 8 mmHg.  Recent Labs: 10/04/2015: ALT 20 01/02/2016: BUN 15; Creat 3.61; Platelets 257 01/13/2016: Hemoglobin 10.5; Potassium 5.6; Sodium 138    Lipid Panel    Component Value Date/Time   CHOL 138 10/28/2015 1008   TRIG 158 (H) 10/28/2015 1008   HDL 30 (L) 10/28/2015 1008   CHOLHDL 4.6 10/28/2015 1008   VLDL 32 (H) 10/28/2015 1008   LDLCALC 76 10/28/2015 1008      Wt Readings from Last 3 Encounters:  08/06/16 95 kg (209 lb 6.4 oz)  07/08/16 95.3 kg (210 lb)  07/05/16 95.3 kg (210 lb 3.2 oz)      Other studies Reviewed: Additional studies/ records that were reviewed today include: [facility] records Review of the above records demonstrates:  Please see elsewhere in the note.     ASSESSMENT AND PLAN:  # Severe aortic stenosis:  # Fatigue: # Shortness of breath: I reviewed Mr. Hafley TTE and TEE.  The valve is heavily calcified and severely stenotic.  There are continuous wave wave Doppler tracings that reveal peak velocities at least 4 cm/s, indicating severe aortic stenosis.  His case was reviewed with Dr. Julianne Handler, who thinks he may be a candidate for TAVR.  We will refer him to Dr. Reatha Armour clinic.  # Hypotension/syncope: Mr. Muns is doing much better and denies any recent episodes of pre-syncope.  Continue midodrine to 15 mg tid.  This would  likely improve with treatment of his severe aortic stenosis.  # Bradycardia/atrial fibrillation: Mr. Carmen had a cardioversion but only went into sinus rhythm temporarily.  He is currently in atrial fibrillation and rates are controlled. Continue warfarin for anticoagulation.   # Hyperlipidemia: LDL 76 10/2015.  Continue atorvastatin.  Current medicines are reviewed at length with the patient today.  The patient does not have concerns regarding medicines.  The following changes have been made: None  Labs/ tests ordered today include:   No orders of the defined types were placed in this encounter.    Disposition:   FU with Dr. Jonelle Sidle C. [place] in 3 months   Signed, Skeet Latch, MD  08/07/2016 4:21 PM    [facility]

## 2016-08-09 ENCOUNTER — Encounter: Payer: Self-pay | Admitting: Cardiovascular Disease

## 2016-08-09 ENCOUNTER — Ambulatory Visit (INDEPENDENT_AMBULATORY_CARE_PROVIDER_SITE_OTHER): Payer: Medicare Other | Admitting: Cardiovascular Disease

## 2016-08-09 ENCOUNTER — Encounter: Payer: Self-pay | Admitting: *Deleted

## 2016-08-09 VITALS — BP 134/70 | HR 77 | Ht 70.5 in | Wt 210.8 lb

## 2016-08-09 DIAGNOSIS — E1129 Type 2 diabetes mellitus with other diabetic kidney complication: Secondary | ICD-10-CM | POA: Diagnosis not present

## 2016-08-09 DIAGNOSIS — I350 Nonrheumatic aortic (valve) stenosis: Secondary | ICD-10-CM

## 2016-08-09 DIAGNOSIS — D631 Anemia in chronic kidney disease: Secondary | ICD-10-CM | POA: Diagnosis not present

## 2016-08-09 DIAGNOSIS — N186 End stage renal disease: Secondary | ICD-10-CM | POA: Diagnosis not present

## 2016-08-09 DIAGNOSIS — N2581 Secondary hyperparathyroidism of renal origin: Secondary | ICD-10-CM | POA: Diagnosis not present

## 2016-08-09 NOTE — Patient Instructions (Signed)
Medication Instructions:  Your physician recommends that you continue on your current medications as directed. Please refer to the Current Medication list given to you today.   Labwork: Your physician recommends that you return for lab work on May 24,2018.  The lab opens at 7:30 AM.  You do not need to be fasting for the lab work.    Testing/Procedures: Your physician has requested that you have a cardiac catheterization. Cardiac catheterization is used to diagnose and/or treat various heart conditions. Doctors may recommend this procedure for a number of different reasons. The most common reason is to evaluate chest pain. Chest pain can be a symptom of coronary artery disease (CAD), and cardiac catheterization can show whether plaque is narrowing or blocking your heart's arteries. This procedure is also used to evaluate the valves, as well as measure the blood flow and oxygen levels in different parts of your heart. For further information please visit [email or web address]. Please follow instruction sheet, as given.    Follow-Up: To be determined after catheterization  Any Other Special Instructions Will Be Listed Below (If Applicable).     If you need a refill on your cardiac medications before your next appointment, please call your pharmacy.

## 2016-08-09 NOTE — Addendum Note (Signed)
Addended by: Lauree Chandler D on: 08/09/2016 06:13 PM   Modules accepted: Orders, SmartSet

## 2016-08-09 NOTE — Progress Notes (Signed)
Chief Complaint  Patient presents with  . New Patient (Initial Visit)    aortic stenosis     History of Present Illness: 76 yo male with history of bradycardia s/p pacemaker placement, DM, HTN, HLD, ESRD on HD, PAF, prior CVA, carotid artery disease and severe aortic stenosis who is referred today as a new consult for further discussion regarding his aortic stenosis and possible TAVR. He is referred by [name]. He has had ongoing issues with hypotension on dialysis requiring midodrine therapy. Pacemaker placed in 2016 due to bradycardia with dizziness. He has been followed in the EP clinic for paroxysmal atrial fibrillation and has failed cardioversion. He is on coumadin. Nuclear stress test negative for ischemia in April 2018. Last echo April 2018 (TEE) with normal LV systolic function, heavily calcified aortic valve leaflets with poor leaflet excursion, severe aortic stenosis. I have reviewed the echo personally with Dr Oval Linsey and the mean gradient of 25 mmHg is underreported. The mean gradient is around 52mmHg on close review of gradient tracings across the aortic valve.    He tells me today that he has had worsening dyspnea with minimal exertion, dizziness. He has had several episodes of syncope over the last year. He also endorses chest pressure with exertion. He has chronic LE edema.   Primary Care Physician: Deland Pretty, MD Primary Cardiologist: Oval Linsey Referring Physician: Oval Linsey  Past Medical History:  Diagnosis Date  . Anemia   . Aortic stenosis 06/15/12   TEE - EF 47-82%; grade 1 diastolic dysfunction; mild/mod aortic valve stenosis; Mitral valve had calcified annulus, mild pulm htn PA peak pressure 19mmHg  . Barrett's esophagus 05/2003  . Bradycardia 2017   [employer] 2240 Assurity dual-lead pacemaker  . Carpal tunnel syndrome, bilateral 11/03/2015  . Colon polyps   . CVA (cerebral infarction)    2004/affected left side  . Depression   . Diabetes mellitus  without complication ([place])   . Diabetic peripheral neuropathy ([place]) 10/02/2015  . Diverticulosis   . End stage renal disease ([place])    hemodialysis 3 times a week  . GERD (gastroesophageal reflux disease)   . Hyperlipidemia   . Hypertension   . Kidney stones   . Macular degeneration    both eyes  . Orthostatic hypotension 09/09/2015  . Paroxysmal atrial fibrillation (HCC)   . Peptic ulcer    bleeding, 1969  . S/P epidural steroid injection    last  injection over 10 years ago  . Seasonal allergies   . Tubular adenoma of colon 07/2001    Past Surgical History:  Procedure Laterality Date  . AV FISTULA PLACEMENT  2009  . BACK SURGERY    . CARDIOVERSION N/A 11/13/2015   Procedure: CARDIOVERSION;  Surgeon: Troy Sine, MD;  Location: [facility] ENDOSCOPY;  Service: Cardiovascular;  Laterality: N/A;  . CARDIOVERSION N/A 01/13/2016   Procedure: CARDIOVERSION;  Surgeon: Will Meredith [name], MD;  Location: [place];  Service: Cardiovascular;  Laterality: N/A;  . [place]   right eye  . CYSTOSCOPY  several times   kidney stones  . EP IMPLANTABLE DEVICE N/A 03/11/2015   Procedure: Pacemaker Implant;  Surgeon: Will Meredith [name], MD;  [place];  Laterality: Left  . LAMINECTOMY  1969  . TEE WITHOUT CARDIOVERSION N/A 07/22/2016   Procedure: TRANSESOPHAGEAL ECHOCARDIOGRAM (TEE);  Surgeon: Skeet Latch, MD;  Location: [place];  Service: Cardiovascular;  Laterality: N/A;  . [place]    Current Outpatient  Prescriptions  Medication Sig Dispense Refill  . atorvastatin (LIPITOR) 40 MG tablet Take 20 mg by mouth daily.    [name] b complex-vitamin c-folic acid (NEPHRO-VITE) 0.8 MG TABS Take 1 tablet by mouth daily.     . bromocriptine (PARLODEL) 5 MG capsule Take 5 mg by mouth at bedtime.     . calcium acetate (PHOSLO) 667 MG capsule Take 667 mg by mouth 4 (four) times daily.     . cefdinir (OMNICEF) 300 MG capsule Take 300 mg by mouth every other  day.    . cetirizine (ZYRTEC) 10 MG tablet Take 10 mg by mouth at bedtime.     . dorzolamide-timolol (COSOPT) 22.3-6.8 MG/ML ophthalmic solution Place 1 drop into the right eye 2 (two) times daily.    [name] FLUoxetine (PROZAC) 20 MG capsule Take 40 mg by mouth daily.     . metoprolol tartrate (LOPRESSOR) 25 MG tablet TAKE 1 TABLET BY MOUTH TWICE DAILY 180 tablet 3  . midodrine (PROAMATINE) 5 MG tablet Take 5 mg by mouth 3 (three) times daily as needed (for systolic blood pressure less than 130).    [name] omeprazole (PRILOSEC) 20 MG capsule Take 20 mg by mouth daily.    . predniSONE (DELTASONE) 5 MG tablet Take 1 tablet by mouth daily.  1  . SENSIPAR 30 MG tablet Take 1 tablet by mouth daily.  5  . warfarin (COUMADIN) 5 MG tablet Take 1-1.5 tablets by mouth daily as directed by coumadin clinic 120 tablet 1   No current facility-administered medications for this visit.     Allergies  Allergen Reactions  . Codeine Nausea Only  . Penicillins Swelling      . Tramadol Nausea Only    Social History   Social History  . Marital status: Married    Spouse name: N/A  . Number of children: 3  . Years of education: 14   Occupational History  . 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 Smoking status: Former Smoker    Packs/day: 2.00    Years: 45.00    Types: Cigarettes    Quit date: 01/20/1998  . Smokeless tobacco: Former Systems developer  . Alcohol use No  . Drug use: No  . Sexual activity: Yes   Other Topics Concern  . Not on file   Social History Narrative   Lives at home w/ his wife   Right-handed   Drinks 1 cup of coffee per day    Family History  Problem Relation Age of Onset  . Stomach cancer Mother   . Hypertension Father        Died of heart attack  . Heart attack Father   . Stroke Sister   . Heart disease Sister   . Cancer Brother   . Colon cancer Neg Hx     Review of Systems:  As stated in the HPI and otherwise negative.   BP 134/70   Pulse 77   Ht 5'  10.5" (1.791 m)   Wt 210 lb 12.8 oz (95.6 kg)   SpO2 98%   BMI 29.82 kg/m   Physical Examination: General: Well developed, well nourished, NAD  HEENT: OP clear, mucus membranes moist  SKIN: warm, dry. No rashes. Neuro: No focal deficits  Musculoskeletal: Muscle strength 5/5 all ext  Psychiatric: Mood and affect normal  Neck: No JVD, no carotid bruits, no thyromegaly, no lymphadenopathy.  Lungs:Clear bilaterally, no wheezes, rhonci, crackles Cardiovascular: Regular rate and rhythm. Loud harsh systolic  murmur. No  gallops or rubs. Abdomen:Soft. Bowel sounds present. Non-tender.  Extremities: No lower extremity edema. Pulses are 2 + in the bilateral DP/PT.  Echo 07/22/16: Left ventricle: Systolic function was normal. The estimated   ejection fraction was in the range of 55% to 60%. Wall motion was   normal; there were no regional wall motion abnormalities. - Aortic valve: Severe calcification. Cusp separation was reduced.   Valve mobility was restricted. There was moderate to severe   stenosis. Peak velocity (S): 318 cm/s. Mean gradient (S): 25 mm   Hg. - Mitral valve: Moderately calcified annulus. Mobility of the   posterior leaflet was restricted. Mild prolapse, involving the   middle segment of the anterior leaflet. There was moderate   regurgitation. - Left atrium: No evidence of thrombus in the atrial cavity or   appendage. No evidence of thrombus in the atrial cavity or   appendage. There was spontaneous echo contrast (&quot;smoke&quot;). - Right ventricle: Systolic function was normal. - Right atrium: No evidence of thrombus in the atrial cavity or   appendage. - Atrial septum: No defect or patent foramen ovale was identified   by color flow Doppler. - Tricuspid valve: There was mild regurgitation.  EKG:  EKG is not ordered today. The ekg ordered 08/06/16 is reviewed today and demonstrates atrial fib, PVC  Recent Labs: 10/04/2015: ALT 20 01/02/2016: BUN 15; Creat 3.61;  Platelets 257 01/13/2016: Hemoglobin 10.5; Potassium 5.6; Sodium 138   Lipid Panel    Component Value Date/Time   CHOL 138 10/28/2015 1008   TRIG 158 (H) 10/28/2015 1008   HDL 30 (L) 10/28/2015 1008   CHOLHDL 4.6 10/28/2015 1008   VLDL 32 (H) 10/28/2015 1008   LDLCALC 76 10/28/2015 1008     Wt Readings from Last 3 Encounters:  08/09/16 210 lb 12.8 oz (95.6 kg)  08/06/16 209 lb 6.4 oz (95 kg)  07/08/16 210 lb (95.3 kg)     Other studies Reviewed: Additional studies/ records that were reviewed today include: Echo images Review of the above records demonstrates: severe AS  Assessment and Plan:   1. Severe aortic valve stenosis: He has stage D symptomatic aortic valve stenosis. I have personally reviewed the echo images. The aortic valve is thickened, calcified with limited leaflet mobility. I think he would benefit from AVR. Given advanced age and comorbid medical conditions, he is not a good candidate for conventional AVR by surgical approach. I think he may be a good candidate for TAVR. He is a very functional 76 yo patient. He is mostly limited by visual impairment. I have reviewed the TAVR procedure in detail today with the patient and his family. He would like to proceed with planning for TAVR. I will arrange a right and left heart catheterization at [facility] 08/20/16. Risks and benefits of procedure reviewed with the patient. After the cath, he will be referred to see one of the CT surgeons on our TAVR team. He will then have Cardiac CT and CTA of the chest/abd/pelvis, PFTs and carotid dopplers.   Current medicines are reviewed at length with the patient today.  The patient does not have concerns regarding medicines.  The following changes have been made:  no change  Labs/ tests ordered today include:   Orders Placed This Encounter  Procedures  . CBC  . Basic Metabolic Panel (BMET)  . INR/PT     Disposition:   FU with our TAVR CT surgeons following his cath.     Signed, Harrell Gave  Angelena Form, MD 08/09/2016 6:10 PM    [facility] [address], [place], [place]  [address] Phone: [phone]; Fax: [phone]

## 2016-08-10 ENCOUNTER — Other Ambulatory Visit: Payer: Self-pay | Admitting: *Deleted

## 2016-08-10 DIAGNOSIS — I350 Nonrheumatic aortic (valve) stenosis: Secondary | ICD-10-CM

## 2016-08-11 DIAGNOSIS — N2581 Secondary hyperparathyroidism of renal origin: Secondary | ICD-10-CM | POA: Diagnosis not present

## 2016-08-11 DIAGNOSIS — E1129 Type 2 diabetes mellitus with other diabetic kidney complication: Secondary | ICD-10-CM | POA: Diagnosis not present

## 2016-08-11 DIAGNOSIS — D631 Anemia in chronic kidney disease: Secondary | ICD-10-CM | POA: Diagnosis not present

## 2016-08-11 DIAGNOSIS — N186 End stage renal disease: Secondary | ICD-10-CM | POA: Diagnosis not present

## 2016-08-13 ENCOUNTER — Ambulatory Visit (INDEPENDENT_AMBULATORY_CARE_PROVIDER_SITE_OTHER): Payer: Medicare Other | Admitting: Pharmacist Clinician (PhC)/ Clinical Pharmacy Specialist

## 2016-08-13 DIAGNOSIS — Z7901 Long term (current) use of anticoagulants: Secondary | ICD-10-CM | POA: Diagnosis not present

## 2016-08-13 DIAGNOSIS — I480 Paroxysmal atrial fibrillation: Secondary | ICD-10-CM

## 2016-08-13 DIAGNOSIS — E1129 Type 2 diabetes mellitus with other diabetic kidney complication: Secondary | ICD-10-CM | POA: Diagnosis not present

## 2016-08-13 DIAGNOSIS — N186 End stage renal disease: Secondary | ICD-10-CM | POA: Diagnosis not present

## 2016-08-13 DIAGNOSIS — D631 Anemia in chronic kidney disease: Secondary | ICD-10-CM | POA: Diagnosis not present

## 2016-08-13 DIAGNOSIS — N2581 Secondary hyperparathyroidism of renal origin: Secondary | ICD-10-CM | POA: Diagnosis not present

## 2016-08-13 LAB — POCT INR: INR: 1.9

## 2016-08-13 MED ORDER — ENOXAPARIN SODIUM 80 MG/0.8ML ~~LOC~~ SOLN: 80.0000 mg | SUBCUTANEOUS | 0 refills | Status: DC

## 2016-08-13 NOTE — Patient Instructions (Addendum)
Enoxaparin instructions:  Weight: 200 lb, Enoxaparin dose 80 mg once daily  Sunday May 20 - no enoxaparin, no warfarin Monday May 21 - enoxaparin 80 mg at noon, no warfarin Tuesday May 22 - enoxaparin 80 mg at noon, no warfarin Wednesday May 23 - enoxaparin 80 mg at noon, no warfarin Thursday May 24  enoxaparin 80 mg at 9 am, no warfarin Friday May 25 - no enoxaparin, take warfarin 2.5 mg (1/2 tab) Saturday May 26 -  enoxaparin 80 mg at noon, warfarin 7.5 mg (1.5 tabs) Sunday May 27 -  enoxaparin 80 mg at noon, warfarin 7.5 mg ( 1.5 tabs) Monday May 28 -  enoxaparin 80 mg at noon, warfarin 7.5 mg (1.5 tabs) Tuesday May 29 - enoxaparin 80 mg at noon, warfarin 7.5 mg (1.5 tabs) Wednesday May 30 - CHECK INR AFTER DIALYSIS

## 2016-08-16 DIAGNOSIS — D631 Anemia in chronic kidney disease: Secondary | ICD-10-CM | POA: Diagnosis not present

## 2016-08-16 DIAGNOSIS — N186 End stage renal disease: Secondary | ICD-10-CM | POA: Diagnosis not present

## 2016-08-16 DIAGNOSIS — N2581 Secondary hyperparathyroidism of renal origin: Secondary | ICD-10-CM | POA: Diagnosis not present

## 2016-08-16 DIAGNOSIS — E1129 Type 2 diabetes mellitus with other diabetic kidney complication: Secondary | ICD-10-CM | POA: Diagnosis not present

## 2016-08-18 DIAGNOSIS — N186 End stage renal disease: Secondary | ICD-10-CM | POA: Diagnosis not present

## 2016-08-18 DIAGNOSIS — E1129 Type 2 diabetes mellitus with other diabetic kidney complication: Secondary | ICD-10-CM | POA: Diagnosis not present

## 2016-08-18 DIAGNOSIS — N2581 Secondary hyperparathyroidism of renal origin: Secondary | ICD-10-CM | POA: Diagnosis not present

## 2016-08-18 DIAGNOSIS — D631 Anemia in chronic kidney disease: Secondary | ICD-10-CM | POA: Diagnosis not present

## 2016-08-19 ENCOUNTER — Other Ambulatory Visit: Payer: Medicare Other | Admitting: *Deleted

## 2016-08-19 ENCOUNTER — Other Ambulatory Visit: Payer: Self-pay | Admitting: Cardiology

## 2016-08-19 DIAGNOSIS — I350 Nonrheumatic aortic (valve) stenosis: Secondary | ICD-10-CM | POA: Diagnosis not present

## 2016-08-19 NOTE — Telephone Encounter (Signed)
Medication Detail    Disp Refills Start End   metoprolol tartrate (LOPRESSOR) 25 MG tablet 180 tablet 3 02/05/2016    Sig: TAKE 1 TABLET BY MOUTH TWICE DAILY   Notes to Pharmacy: **Patient requests 90 days supply**   E-Prescribing Status: Receipt confirmed by pharmacy (02/05/2016 3:05 PM EST)   Pharmacy   [id number] - [place], [place] - [address] AT [place]

## 2016-08-20 ENCOUNTER — Ambulatory Visit (HOSPITAL_COMMUNITY)
Admission: RE | Disposition: A | Discharge status: Home / Self Care | Payer: Self-pay | Source: Ambulatory Visit | Attending: Cardiovascular Disease

## 2016-08-20 ENCOUNTER — Ambulatory Visit (HOSPITAL_COMMUNITY)
Admission: RE | Admit: 2016-08-20 | Discharge: 2016-08-20 | Disposition: A | Discharge status: Home / Self Care | Payer: Medicare Other | Source: Ambulatory Visit | Attending: Cardiovascular Disease | Admitting: Cardiovascular Disease

## 2016-08-20 DIAGNOSIS — I120 Hypertensive chronic kidney disease with stage 5 chronic kidney disease or end stage renal disease: Secondary | ICD-10-CM | POA: Diagnosis not present

## 2016-08-20 DIAGNOSIS — F329 Major depressive disorder, single episode, unspecified: Secondary | ICD-10-CM | POA: Diagnosis not present

## 2016-08-20 DIAGNOSIS — Z87891 Personal history of nicotine dependence: Secondary | ICD-10-CM | POA: Diagnosis not present

## 2016-08-20 DIAGNOSIS — I350 Nonrheumatic aortic (valve) stenosis: Secondary | ICD-10-CM | POA: Insufficient documentation

## 2016-08-20 DIAGNOSIS — I480 Paroxysmal atrial fibrillation: Secondary | ICD-10-CM | POA: Insufficient documentation

## 2016-08-20 DIAGNOSIS — K219 Gastro-esophageal reflux disease without esophagitis: Secondary | ICD-10-CM | POA: Insufficient documentation

## 2016-08-20 DIAGNOSIS — E785 Hyperlipidemia, unspecified: Secondary | ICD-10-CM | POA: Insufficient documentation

## 2016-08-20 DIAGNOSIS — Z950 Presence of cardiac pacemaker: Secondary | ICD-10-CM | POA: Diagnosis not present

## 2016-08-20 DIAGNOSIS — Z7901 Long term (current) use of anticoagulants: Secondary | ICD-10-CM | POA: Diagnosis not present

## 2016-08-20 DIAGNOSIS — H3530 Unspecified macular degeneration: Secondary | ICD-10-CM | POA: Diagnosis not present

## 2016-08-20 DIAGNOSIS — Z8711 Personal history of peptic ulcer disease: Secondary | ICD-10-CM | POA: Diagnosis not present

## 2016-08-20 DIAGNOSIS — Z7952 Long term (current) use of systemic steroids: Secondary | ICD-10-CM | POA: Insufficient documentation

## 2016-08-20 DIAGNOSIS — D631 Anemia in chronic kidney disease: Secondary | ICD-10-CM | POA: Insufficient documentation

## 2016-08-20 DIAGNOSIS — I2584 Coronary atherosclerosis due to calcified coronary lesion: Secondary | ICD-10-CM | POA: Diagnosis not present

## 2016-08-20 DIAGNOSIS — I2510 Atherosclerotic heart disease of native coronary artery without angina pectoris: Secondary | ICD-10-CM | POA: Diagnosis not present

## 2016-08-20 DIAGNOSIS — N186 End stage renal disease: Secondary | ICD-10-CM | POA: Insufficient documentation

## 2016-08-20 DIAGNOSIS — E1142 Type 2 diabetes mellitus with diabetic polyneuropathy: Secondary | ICD-10-CM | POA: Insufficient documentation

## 2016-08-20 DIAGNOSIS — Z880 Allergy status to penicillin: Secondary | ICD-10-CM | POA: Insufficient documentation

## 2016-08-20 DIAGNOSIS — E1122 Type 2 diabetes mellitus with diabetic chronic kidney disease: Secondary | ICD-10-CM | POA: Insufficient documentation

## 2016-08-20 DIAGNOSIS — Z8249 Family history of ischemic heart disease and other diseases of the circulatory system: Secondary | ICD-10-CM | POA: Insufficient documentation

## 2016-08-20 DIAGNOSIS — Z992 Dependence on renal dialysis: Secondary | ICD-10-CM | POA: Insufficient documentation

## 2016-08-20 DIAGNOSIS — Z8673 Personal history of transient ischemic attack (TIA), and cerebral infarction without residual deficits: Secondary | ICD-10-CM | POA: Insufficient documentation

## 2016-08-20 DIAGNOSIS — I953 Hypotension of hemodialysis: Secondary | ICD-10-CM | POA: Insufficient documentation

## 2016-08-20 DIAGNOSIS — Z947 Corneal transplant status: Secondary | ICD-10-CM | POA: Insufficient documentation

## 2016-08-20 DIAGNOSIS — E1129 Type 2 diabetes mellitus with other diabetic kidney complication: Secondary | ICD-10-CM | POA: Diagnosis not present

## 2016-08-20 DIAGNOSIS — N2581 Secondary hyperparathyroidism of renal origin: Secondary | ICD-10-CM | POA: Diagnosis not present

## 2016-08-20 HISTORY — PX: RIGHT/LEFT HEART CATH AND CORONARY ANGIOGRAPHY: CATH118266

## 2016-08-20 LAB — POCT I-STAT 3, ART BLOOD GAS (G3+)
ACID-BASE EXCESS: 6 mmol/L — AB (ref 0.0–2.0)
BICARBONATE: 28.7 mmol/L — AB (ref 20.0–28.0)
O2 Saturation: 97 %
TCO2: 30 mmol/L (ref 0–100)
pCO2 arterial: 34.3 mmHg (ref 32.0–48.0)
pH, Arterial: 7.53 — ABNORMAL HIGH (ref 7.350–7.450)
pO2, Arterial: 76 mmHg — ABNORMAL LOW (ref 83.0–108.0)

## 2016-08-20 LAB — POCT I-STAT 3, VENOUS BLOOD GAS (G3P V)
Acid-Base Excess: 8 mmol/L — ABNORMAL HIGH (ref 0.0–2.0)
Bicarbonate: 32.2 mmol/L — ABNORMAL HIGH (ref 20.0–28.0)
O2 SAT: 67 %
TCO2: 33 mmol/L (ref 0–100)
pCO2, Ven: 43 mmHg — ABNORMAL LOW (ref 44.0–60.0)
pH, Ven: 7.482 — ABNORMAL HIGH (ref 7.250–7.430)
pO2, Ven: 33 mmHg (ref 32.0–45.0)

## 2016-08-20 LAB — BASIC METABOLIC PANEL
BUN/Creatinine Ratio: 4 — ABNORMAL LOW (ref 10–24)
BUN: 22 mg/dL (ref 8–27)
CALCIUM: 8.2 mg/dL — AB (ref 8.6–10.2)
CO2: 26 mmol/L (ref 18–29)
CREATININE: 5.21 mg/dL — AB (ref 0.76–1.27)
Chloride: 95 mmol/L — ABNORMAL LOW (ref 96–106)
GFR, EST AFRICAN AMERICAN: 12 mL/min/{1.73_m2} — AB (ref >59)
GFR, EST NON AFRICAN AMERICAN: 10 mL/min/{1.73_m2} — AB (ref >59)
Glucose: 124 mg/dL — ABNORMAL HIGH (ref 65–99)
Potassium: 3.6 mmol/L (ref 3.5–5.2)
Sodium: 139 mmol/L (ref 134–144)

## 2016-08-20 LAB — CBC
HEMATOCRIT: 34.5 % — AB (ref 37.5–51.0)
HEMOGLOBIN: 11.2 g/dL — AB (ref 13.0–17.7)
MCH: 32.9 pg (ref 26.6–33.0)
MCHC: 32.5 g/dL (ref 31.5–35.7)
MCV: 102 fL — AB (ref 79–97)
Platelets: 186 10*3/uL (ref 150–379)
RBC: 3.4 x10E6/uL — ABNORMAL LOW (ref 4.14–5.80)
RDW: 17.5 % — ABNORMAL HIGH (ref 12.3–15.4)
WBC: 6.6 10*3/uL (ref 3.4–10.8)

## 2016-08-20 LAB — PROTIME-INR
INR: 1.1 (ref 0.8–1.2)
INR: 1.01
PROTHROMBIN TIME: 11.8 s (ref 9.1–12.0)
Prothrombin Time: 13.3 seconds (ref 11.4–15.2)

## 2016-08-20 LAB — GLUCOSE, CAPILLARY
GLUCOSE-CAPILLARY: 94 mg/dL (ref 65–99)
Glucose-Capillary: 87 mg/dL (ref 65–99)

## 2016-08-20 SURGERY — RIGHT/LEFT HEART CATH AND CORONARY ANGIOGRAPHY
Anesthesia: LOCAL

## 2016-08-20 MED ORDER — IOPAMIDOL (ISOVUE-370) INJECTION 76%
INTRAVENOUS | Status: AC
  Filled 2016-08-20: qty 100

## 2016-08-20 MED ORDER — SODIUM CHLORIDE 0.9 % IV SOLN: INTRAVENOUS | Status: DC

## 2016-08-20 MED ORDER — SODIUM CHLORIDE 0.9 % IV SOLN
250.0000 mL | INTRAVENOUS | Status: DC | PRN
Start: 2016-08-20 — End: 2016-08-20

## 2016-08-20 MED ORDER — LIDOCAINE HCL (PF) 1 % IJ SOLN
INTRAMUSCULAR | Status: AC
  Filled 2016-08-20: qty 30

## 2016-08-20 MED ORDER — MIDAZOLAM HCL 2 MG/2ML IJ SOLN
INTRAMUSCULAR | Status: AC
  Filled 2016-08-20: qty 2

## 2016-08-20 MED ORDER — SODIUM CHLORIDE 0.9% FLUSH: 3.0000 mL | INTRAVENOUS | Status: DC | PRN

## 2016-08-20 MED ORDER — SODIUM CHLORIDE 0.9% FLUSH
3.0000 mL | Freq: Two times a day (BID) | INTRAVENOUS | Status: DC
Start: 2016-08-20 — End: 2016-08-20

## 2016-08-20 MED ORDER — HEPARIN (PORCINE) IN NACL 2-0.9 UNIT/ML-% IJ SOLN
INTRAMUSCULAR | Status: AC | PRN
  Administered 2016-08-20: 1000 mL

## 2016-08-20 MED ORDER — FENTANYL CITRATE (PF) 100 MCG/2ML IJ SOLN
INTRAMUSCULAR | Status: AC
  Filled 2016-08-20: qty 2

## 2016-08-20 MED ORDER — ASPIRIN 81 MG PO CHEW
CHEWABLE_TABLET | ORAL | Status: AC
  Filled 2016-08-20: qty 1

## 2016-08-20 MED ORDER — MIDAZOLAM HCL 2 MG/2ML IJ SOLN
INTRAMUSCULAR | Status: DC | PRN
  Administered 2016-08-20: 0.5 mg via INTRAVENOUS

## 2016-08-20 MED ORDER — HEPARIN (PORCINE) IN NACL 2-0.9 UNIT/ML-% IJ SOLN
INTRAMUSCULAR | Status: AC
  Filled 2016-08-20: qty 1000

## 2016-08-20 MED ORDER — SODIUM CHLORIDE 0.9 % IV SOLN: 250.0000 mL | INTRAVENOUS | Status: DC | PRN

## 2016-08-20 MED ORDER — IOPAMIDOL (ISOVUE-370) INJECTION 76%
INTRAVENOUS | Status: DC | PRN
  Administered 2016-08-20: 45 mL via INTRA_ARTERIAL

## 2016-08-20 MED ORDER — LIDOCAINE HCL (PF) 1 % IJ SOLN
INTRAMUSCULAR | Status: DC | PRN
  Administered 2016-08-20: 15 mL

## 2016-08-20 MED ORDER — SODIUM CHLORIDE 0.9% FLUSH: 3.0000 mL | Freq: Two times a day (BID) | INTRAVENOUS | Status: DC

## 2016-08-20 MED ORDER — ASPIRIN 81 MG PO CHEW
81.0000 mg | CHEWABLE_TABLET | ORAL | Status: AC
  Administered 2016-08-20: 81 mg via ORAL

## 2016-08-20 SURGICAL SUPPLY — 12 items
CATH EXPO 5FR AL2 (CATHETERS) ×2 IMPLANT
CATH INFINITI 5FR MULTPACK ANG (CATHETERS) ×2 IMPLANT
CATH SWAN GANZ 7F STRAIGHT (CATHETERS) ×2 IMPLANT
GUIDEWIRE 3MM J TIP .035 145 (WIRE) ×2 IMPLANT
KIT HEART LEFT (KITS) ×2 IMPLANT
PACK CARDIAC CATHETERIZATION (CUSTOM PROCEDURE TRAY) ×2 IMPLANT
SHEATH PINNACLE 5F 10CM (SHEATH) ×2 IMPLANT
SHEATH PINNACLE 7F 10CM (SHEATH) ×2 IMPLANT
TRANSDUCER W/STOPCOCK (MISCELLANEOUS) ×2 IMPLANT
TUBING CIL FLEX 10 FLL-RA (TUBING) ×2 IMPLANT
WIRE EMERALD 3MM-J .025X260CM (WIRE) ×2 IMPLANT
WIRE EMERALD ST .035X150CM (WIRE) ×2 IMPLANT

## 2016-08-20 NOTE — Progress Notes (Signed)
Site area: Right groin a 5 french arterial sheath was removed  Site Prior to Removal:  Level 0  Pressure Applied For 20 MINUTES    Bedrest Beginning at 1635p  Manual:   Yes.    Patient Status During Pull:  stable  Post Pull Groin Site:  Level 0  Post Pull Instructions Given:  Yes.    Post Pull Pulses Present:  Yes.    Dressing Applied:  Yes.    Comments:  VS remain stable during sheath pull

## 2016-08-20 NOTE — Interval H&P Note (Signed)
History and Physical Interval Note:  08/20/2016 12:18 PM  [name]  has presented today for cardiac cath with the diagnosis of severe AS. The various methods of treatment have been discussed with the patient and family. After consideration of risks, benefits and other options for treatment, the patient has consented to  Procedure(s): Right/Left Heart Cath and Coronary Angiography (N/A) as a surgical intervention .  The patient's history has been reviewed, patient examined, no change in status, stable for surgery.  I have reviewed the patient's chart and labs.  Questions were answered to the patient's satisfaction.    Cath Lab Visit (complete for each Cath Lab visit)  Clinical Evaluation Leading to the Procedure:   ACS: No.  Non-ACS:    Anginal Classification: CCS III  Anti-ischemic medical therapy: Minimal Therapy (1 class of medications)  Non-Invasive Test Results: No non-invasive testing performed  Prior CABG: No previous CABG        Lauree Chandler

## 2016-08-20 NOTE — H&P (View-Only) (Signed)
Chief Complaint  Patient presents with  . New Patient (Initial Visit)    aortic stenosis     History of Present Illness: 76 yo male with history of bradycardia s/p pacemaker placement, DM, HTN, HLD, ESRD on HD, PAF, prior CVA, carotid artery disease and severe aortic stenosis who is referred today as a new consult for further discussion regarding his aortic stenosis and possible TAVR. He is referred by [name]. He has had ongoing issues with hypotension on dialysis requiring midodrine therapy. Pacemaker placed in 2016 due to bradycardia with dizziness. He has been followed in the EP clinic for paroxysmal atrial fibrillation and has failed cardioversion. He is on coumadin. Nuclear stress test negative for ischemia in April 2018. Last echo April 2018 (TEE) with normal LV systolic function, heavily calcified aortic valve leaflets with poor leaflet excursion, severe aortic stenosis. I have reviewed the echo personally with Dr Oval Linsey and the mean gradient of 25 mmHg is underreported. The mean gradient is around 57mmHg on close review of gradient tracings across the aortic valve.    He tells me today that he has had worsening dyspnea with minimal exertion, dizziness. He has had several episodes of syncope over the last year. He also endorses chest pressure with exertion. He has chronic LE edema.   Primary Care Physician: Deland Pretty, MD Primary Cardiologist: Oval Linsey Referring Physician: Oval Linsey  Past Medical History:  Diagnosis Date  . Anemia   . Aortic stenosis 06/15/12   TEE - EF 16-10%; grade 1 diastolic dysfunction; mild/mod aortic valve stenosis; Mitral valve had calcified annulus, mild pulm htn PA peak pressure 7mmHg  . Barrett's esophagus 05/2003  . Bradycardia 2017   [employer] 2240 Assurity dual-lead pacemaker  . Carpal tunnel syndrome, bilateral 11/03/2015  . Colon polyps   . CVA (cerebral infarction)    2004/affected left side  . Depression   . Diabetes mellitus  without complication ([place])   . Diabetic peripheral neuropathy ([place]) 10/02/2015  . Diverticulosis   . End stage renal disease ([place])    hemodialysis 3 times a week  . GERD (gastroesophageal reflux disease)   . Hyperlipidemia   . Hypertension   . Kidney stones   . Macular degeneration    both eyes  . Orthostatic hypotension 09/09/2015  . Paroxysmal atrial fibrillation ([facility])   . Peptic ulcer    bleeding, 1969  . S/P epidural steroid injection    last  injection over 10 years ago  . Seasonal allergies   . Tubular adenoma of colon 07/2001    Past Surgical History:  Procedure Laterality Date  . AV FISTULA PLACEMENT  2009  . BACK SURGERY    . CARDIOVERSION N/A 11/13/2015   Procedure: CARDIOVERSION;  Surgeon: Troy Sine, MD;  [facility] ENDOSCOPY;  Service: Cardiovascular;  Laterality: N/A;  . CARDIOVERSION N/A 01/13/2016   Procedure: CARDIOVERSION;  Surgeon: Will Meredith [name], MD;  Location: [facility];  Service: Cardiovascular;  Laterality: N/A;  . [place]   right eye  . CYSTOSCOPY  several times   kidney stones  . EP IMPLANTABLE DEVICE N/A 03/11/2015   Procedure: Pacemaker Implant;  Surgeon: Will Meredith [name], MD;  [place];  Laterality: Left  . LAMINECTOMY  1969  . TEE WITHOUT CARDIOVERSION N/A 07/22/2016   Procedure: TRANSESOPHAGEAL ECHOCARDIOGRAM (TEE);  Surgeon: Skeet Latch, MD;  Location: [facility];  Service: Cardiovascular;  Laterality: N/A;  . [place]    Current Outpatient  Prescriptions  Medication Sig Dispense Refill  . atorvastatin (LIPITOR) 40 MG tablet Take 20 mg by mouth daily.    [name] b complex-vitamin c-folic acid (NEPHRO-VITE) 0.8 MG TABS Take 1 tablet by mouth daily.     . bromocriptine (PARLODEL) 5 MG capsule Take 5 mg by mouth at bedtime.     . calcium acetate (PHOSLO) 667 MG capsule Take 667 mg by mouth 4 (four) times daily.     . cefdinir (OMNICEF) 300 MG capsule Take 300 mg by mouth every other  day.    . cetirizine (ZYRTEC) 10 MG tablet Take 10 mg by mouth at bedtime.     . dorzolamide-timolol (COSOPT) 22.3-6.8 MG/ML ophthalmic solution Place 1 drop into the right eye 2 (two) times daily.    [name] FLUoxetine (PROZAC) 20 MG capsule Take 40 mg by mouth daily.     . metoprolol tartrate (LOPRESSOR) 25 MG tablet TAKE 1 TABLET BY MOUTH TWICE DAILY 180 tablet 3  . midodrine (PROAMATINE) 5 MG tablet Take 5 mg by mouth 3 (three) times daily as needed (for systolic blood pressure less than 130).    [name] omeprazole (PRILOSEC) 20 MG capsule Take 20 mg by mouth daily.    . predniSONE (DELTASONE) 5 MG tablet Take 1 tablet by mouth daily.  1  . SENSIPAR 30 MG tablet Take 1 tablet by mouth daily.  5  . warfarin (COUMADIN) 5 MG tablet Take 1-1.5 tablets by mouth daily as directed by coumadin clinic 120 tablet 1   No current facility-administered medications for this visit.     Allergies  Allergen Reactions  . Codeine Nausea Only  . Penicillins Swelling      . Tramadol Nausea Only    Social History   Social History  . Marital status: Married    Spouse name: N/A  . Number of children: 3  . Years of education: 14   Occupational History  . 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 Smoking status: Former Smoker    Packs/day: 2.00    Years: 45.00    Types: Cigarettes    Quit date: 01/20/1998  . Smokeless tobacco: Former Systems developer  . Alcohol use No  . Drug use: No  . Sexual activity: Yes   Other Topics Concern  . Not on file   Social History Narrative   Lives at home w/ his wife   Right-handed   Drinks 1 cup of coffee per day    Family History  Problem Relation Age of Onset  . Stomach cancer Mother   . Hypertension Father        Died of heart attack  . Heart attack Father   . Stroke Sister   . Heart disease Sister   . Cancer Brother   . Colon cancer Neg Hx     Review of Systems:  As stated in the HPI and otherwise negative.   BP 134/70   Pulse 77   Ht 5'  10.5" (1.791 m)   Wt 210 lb 12.8 oz (95.6 kg)   SpO2 98%   BMI 29.82 kg/m   Physical Examination: General: Well developed, well nourished, NAD  HEENT: OP clear, mucus membranes moist  SKIN: warm, dry. No rashes. Neuro: No focal deficits  Musculoskeletal: Muscle strength 5/5 all ext  Psychiatric: Mood and affect normal  Neck: No JVD, no carotid bruits, no thyromegaly, no lymphadenopathy.  Lungs:Clear bilaterally, no wheezes, rhonci, crackles Cardiovascular: Regular rate and rhythm. Loud harsh systolic  murmur. No  gallops or rubs. Abdomen:Soft. Bowel sounds present. Non-tender.  Extremities: No lower extremity edema. Pulses are 2 + in the bilateral DP/PT.  Echo 07/22/16: Left ventricle: Systolic function was normal. The estimated   ejection fraction was in the range of 55% to 60%. Wall motion was   normal; there were no regional wall motion abnormalities. - Aortic valve: Severe calcification. Cusp separation was reduced.   Valve mobility was restricted. There was moderate to severe   stenosis. Peak velocity (S): 318 cm/s. Mean gradient (S): 25 mm   Hg. - Mitral valve: Moderately calcified annulus. Mobility of the   posterior leaflet was restricted. Mild prolapse, involving the   middle segment of the anterior leaflet. There was moderate   regurgitation. - Left atrium: No evidence of thrombus in the atrial cavity or   appendage. No evidence of thrombus in the atrial cavity or   appendage. There was spontaneous echo contrast (&quot;smoke&quot;). - Right ventricle: Systolic function was normal. - Right atrium: No evidence of thrombus in the atrial cavity or   appendage. - Atrial septum: No defect or patent foramen ovale was identified   by color flow Doppler. - Tricuspid valve: There was mild regurgitation.  EKG:  EKG is not ordered today. The ekg ordered 08/06/16 is reviewed today and demonstrates atrial fib, PVC  Recent Labs: 10/04/2015: ALT 20 01/02/2016: BUN 15; Creat 3.61;  Platelets 257 01/13/2016: Hemoglobin 10.5; Potassium 5.6; Sodium 138   Lipid Panel    Component Value Date/Time   CHOL 138 10/28/2015 1008   TRIG 158 (H) 10/28/2015 1008   HDL 30 (L) 10/28/2015 1008   CHOLHDL 4.6 10/28/2015 1008   VLDL 32 (H) 10/28/2015 1008   LDLCALC 76 10/28/2015 1008     Wt Readings from Last 3 Encounters:  08/09/16 210 lb 12.8 oz (95.6 kg)  08/06/16 209 lb 6.4 oz (95 kg)  07/08/16 210 lb (95.3 kg)     Other studies Reviewed: Additional studies/ records that were reviewed today include: Echo images Review of the above records demonstrates: severe AS  Assessment and Plan:   1. Severe aortic valve stenosis: He has stage D symptomatic aortic valve stenosis. I have personally reviewed the echo images. The aortic valve is thickened, calcified with limited leaflet mobility. I think he would benefit from AVR. Given advanced age and comorbid medical conditions, he is not a good candidate for conventional AVR by surgical approach. I think he may be a good candidate for TAVR. He is a very functional 76 yo patient. He is mostly limited by visual impairment. I have reviewed the TAVR procedure in detail today with the patient and his family. He would like to proceed with planning for TAVR. I will arrange a right and left heart catheterization at [facility] 08/20/16. Risks and benefits of procedure reviewed with the patient. After the cath, he will be referred to see one of the CT surgeons on our TAVR team. He will then have Cardiac CT and CTA of the chest/abd/pelvis, PFTs and carotid dopplers.   Current medicines are reviewed at length with the patient today.  The patient does not have concerns regarding medicines.  The following changes have been made:  no change  Labs/ tests ordered today include:   Orders Placed This Encounter  Procedures  . CBC  . Basic Metabolic Panel (BMET)  . INR/PT     Disposition:   FU with our TAVR CT surgeons following his cath.     Signed, Harrell Gave  Angelena Form, MD 08/09/2016 6:10 PM    [facility] [place], [place], [place]  [address] Phone: [phone]; Fax: [phone]

## 2016-08-20 NOTE — Progress Notes (Signed)
Up and walked and tol well and right groin stable; no bleeding or hematoma 

## 2016-08-20 NOTE — Discharge Instructions (Signed)
Femoral Site Care Refer to this sheet in the next few weeks. These instructions provide you with information about caring for yourself after your procedure. Your health care provider may also give you more specific instructions. Your treatment has been planned according to current medical practices, but problems sometimes occur. Call your health care provider if you have any problems or questions after your procedure. What can I expect after the procedure? After your procedure, it is typical to have the following:  Bruising at the site that usually fades within 1-2 weeks.  Blood collecting in the tissue (hematoma) that may be painful to the touch. It should usually decrease in size and tenderness within 1-2 weeks. Follow these instructions at home:  Take medicines only as directed by your health care provider.  You may shower 24-48 hours after the procedure or as directed by your health care provider. Remove the bandage (dressing) and gently wash the site with plain soap and water. Pat the area dry with a clean towel. Do not rub the site, because this may cause bleeding.  Do not take baths, swim, or use a hot tub until your health care provider approves.  Check your insertion site every day for redness, swelling, or drainage.  Do not apply powder or lotion to the site.  Limit use of stairs to twice a day for the first 2-3 days or as directed by your health care provider.  Do not squat for the first 2-3 days or as directed by your health care provider.  Do not lift over 10 lb (4.5 kg) for 5 days after your procedure or as directed by your health care provider.  Ask your health care provider when it is okay to:  Return to work or school.  Resume usual physical activities or sports.  Resume sexual activity.  Do not drive home if you are discharged the same day as the procedure. Have someone else drive you.  You may drive 24 hours after the procedure unless otherwise instructed by  your health care provider.  Do not operate machinery or power tools for 24 hours after the procedure or as directed by your health care provider.  If your procedure was done as an outpatient procedure, which means that you went home the same day as your procedure, a responsible adult should be with you for the first 24 hours after you arrive home.  Keep all follow-up visits as directed by your health care provider. This is important. Contact a health care provider if:  You have a fever.  You have chills.  You have increased bleeding from the site. Hold pressure on the site. Get help right away if:  You have unusual pain at the site.  You have redness, warmth, or swelling at the site.  You have drainage (other than a small amount of blood on the dressing) from the site.  The site is bleeding, and the bleeding does not stop after 30 minutes of holding steady pressure on the site.  Your leg or foot becomes pale, cool, tingly, or numb. This information is not intended to replace advice given to you by your health care provider. Make sure you discuss any questions you have with your health care provider. Document Released: 11/16/2013 Document Revised: 08/21/2015 Document Reviewed: 10/02/2013 Elsevier Interactive Patient Education  2017 [place] Lovenox injections tomorrow if no bleeding from groin Resume coumadin tonight Follow up for INR check Tuesday

## 2016-08-23 DIAGNOSIS — D631 Anemia in chronic kidney disease: Secondary | ICD-10-CM | POA: Diagnosis not present

## 2016-08-23 DIAGNOSIS — N186 End stage renal disease: Secondary | ICD-10-CM | POA: Diagnosis not present

## 2016-08-23 DIAGNOSIS — N2581 Secondary hyperparathyroidism of renal origin: Secondary | ICD-10-CM | POA: Diagnosis not present

## 2016-08-23 DIAGNOSIS — E1129 Type 2 diabetes mellitus with other diabetic kidney complication: Secondary | ICD-10-CM | POA: Diagnosis not present

## 2016-08-24 ENCOUNTER — Ambulatory Visit (HOSPITAL_COMMUNITY)
Admission: RE | Admit: 2016-08-24 | Discharge: 2016-08-24 | Disposition: A | Discharge status: Home / Self Care | Payer: Medicare Other | Source: Ambulatory Visit | Attending: Cardiovascular Disease | Admitting: Cardiovascular Disease

## 2016-08-24 ENCOUNTER — Encounter (HOSPITAL_COMMUNITY): Payer: Self-pay | Admitting: Cardiovascular Disease

## 2016-08-24 ENCOUNTER — Ambulatory Visit (HOSPITAL_BASED_OUTPATIENT_CLINIC_OR_DEPARTMENT_OTHER)
Admission: RE | Admit: 2016-08-24 | Discharge: 2016-08-24 | Disposition: A | Discharge status: Home / Self Care | Payer: Medicare Other | Source: Ambulatory Visit | Attending: Cardiovascular Disease | Admitting: Cardiovascular Disease

## 2016-08-24 DIAGNOSIS — I700 Atherosclerosis of aorta: Secondary | ICD-10-CM | POA: Diagnosis not present

## 2016-08-24 DIAGNOSIS — I6523 Occlusion and stenosis of bilateral carotid arteries: Secondary | ICD-10-CM | POA: Insufficient documentation

## 2016-08-24 DIAGNOSIS — I350 Nonrheumatic aortic (valve) stenosis: Secondary | ICD-10-CM

## 2016-08-24 DIAGNOSIS — J90 Pleural effusion, not elsewhere classified: Secondary | ICD-10-CM | POA: Diagnosis not present

## 2016-08-24 DIAGNOSIS — K5700 Diverticulitis of small intestine with perforation and abscess without bleeding: Secondary | ICD-10-CM | POA: Diagnosis not present

## 2016-08-24 LAB — PULMONARY FUNCTION TEST
DL/VA % PRED: 93 %
DL/VA: 4.3 ml/min/mmHg/L
DLCO COR % PRED: 71 %
DLCO COR: 23.2 ml/min/mmHg
DLCO unc % pred: 63 %
DLCO unc: 20.62 ml/min/mmHg
FEF 25-75 POST: 1.75 L/s
FEF 25-75 Pre: 1.42 L/sec
FEF2575-%CHANGE-POST: 23 %
FEF2575-%PRED-PRE: 65 %
FEF2575-%Pred-Post: 80 %
FEV1-%CHANGE-POST: 3 %
FEV1-%Pred-Post: 79 %
FEV1-%Pred-Pre: 77 %
FEV1-Post: 2.42 L
FEV1-Pre: 2.34 L
FEV1FVC-%CHANGE-POST: 2 %
FEV1FVC-%PRED-PRE: 99 %
FEV6-%Change-Post: 2 %
FEV6-%PRED-PRE: 80 %
FEV6-%Pred-Post: 82 %
FEV6-Post: 3.25 L
FEV6-Pre: 3.18 L
FEV6FVC-%Change-Post: 1 %
FEV6FVC-%Pred-Post: 105 %
FEV6FVC-%Pred-Pre: 104 %
FVC-%Change-Post: 0 %
FVC-%PRED-PRE: 77 %
FVC-%Pred-Post: 78 %
FVC-POST: 3.28 L
FVC-PRE: 3.25 L
POST FEV6/FVC RATIO: 99 %
PRE FEV1/FVC RATIO: 72 %
PRE FEV6/FVC RATIO: 98 %
Post FEV1/FVC ratio: 74 %
RV % pred: 70 %
RV: 1.82 L
TLC % pred: 76 %
TLC: 5.42 L

## 2016-08-24 LAB — VAS US CAROTID
LCCADSYS: 29 cm/s
LCCAPSYS: 28 cm/s
LEFT ECA DIAS: -9 cm/s
LICAPSYS: 0 cm/s
RCCADSYS: -130 cm/s
RCCAPDIAS: 30 cm/s
RIGHT ECA DIAS: -11 cm/s
RIGHT VERTEBRAL DIAS: -18 cm/s
Right CCA prox sys: 66 cm/s

## 2016-08-24 MED ORDER — ALBUTEROL SULFATE (2.5 MG/3ML) 0.083% IN NEBU
2.5000 mg | INHALATION_SOLUTION | Freq: Once | RESPIRATORY_TRACT | Status: AC
  Administered 2016-08-24: 2.5 mg via RESPIRATORY_TRACT

## 2016-08-24 MED ORDER — METOPROLOL TARTRATE 5 MG/5ML IV SOLN
INTRAVENOUS | Status: AC
  Filled 2016-08-24: qty 20

## 2016-08-24 MED ORDER — IOPAMIDOL (ISOVUE-370) INJECTION 76%
INTRAVENOUS | Status: AC
  Administered 2016-08-24: 100 mL
  Filled 2016-08-24: qty 100

## 2016-08-24 MED ORDER — METOPROLOL TARTRATE 5 MG/5ML IV SOLN
5.0000 mg | INTRAVENOUS | Status: AC | PRN
  Administered 2016-08-24 (×4): 5 mg via INTRAVENOUS
  Filled 2016-08-24 (×4): qty 5

## 2016-08-24 MED ORDER — IOPAMIDOL (ISOVUE-370) INJECTION 76%
INTRAVENOUS | Status: AC
  Administered 2016-08-24: 50 mL
  Filled 2016-08-24: qty 50

## 2016-08-24 MED FILL — Fentanyl Citrate Preservative Free (PF) Inj 100 MCG/2ML: INTRAMUSCULAR | Qty: 2 | Status: AC

## 2016-08-24 NOTE — Progress Notes (Signed)
Preliminary results by tech - Carotid Duplex Completed. Right ICA 40-59% stenosis, left ICA demonstrated known occlusive disease. Right vertebral appeared patent and left vertebral appeared occluded. Oda Cogan, BS, RDMS, RVT

## 2016-08-25 ENCOUNTER — Ambulatory Visit (INDEPENDENT_AMBULATORY_CARE_PROVIDER_SITE_OTHER): Payer: Medicare Other | Admitting: Pharmacist Clinician (PhC)/ Clinical Pharmacy Specialist

## 2016-08-25 ENCOUNTER — Other Ambulatory Visit: Payer: Self-pay | Admitting: Cardiovascular Disease

## 2016-08-25 DIAGNOSIS — N186 End stage renal disease: Secondary | ICD-10-CM | POA: Diagnosis not present

## 2016-08-25 DIAGNOSIS — M1189 Other specified crystal arthropathies, multiple sites: Secondary | ICD-10-CM | POA: Diagnosis not present

## 2016-08-25 DIAGNOSIS — N2581 Secondary hyperparathyroidism of renal origin: Secondary | ICD-10-CM | POA: Diagnosis not present

## 2016-08-25 DIAGNOSIS — Z7901 Long term (current) use of anticoagulants: Secondary | ICD-10-CM

## 2016-08-25 DIAGNOSIS — E1129 Type 2 diabetes mellitus with other diabetic kidney complication: Secondary | ICD-10-CM | POA: Diagnosis not present

## 2016-08-25 DIAGNOSIS — M25512 Pain in left shoulder: Secondary | ICD-10-CM | POA: Diagnosis not present

## 2016-08-25 DIAGNOSIS — I480 Paroxysmal atrial fibrillation: Secondary | ICD-10-CM | POA: Diagnosis not present

## 2016-08-25 DIAGNOSIS — D631 Anemia in chronic kidney disease: Secondary | ICD-10-CM | POA: Diagnosis not present

## 2016-08-25 LAB — POCT INR: INR: 2

## 2016-08-25 NOTE — Telephone Encounter (Signed)
Rx has been sent to the pharmacy electronically. ° °

## 2016-08-25 NOTE — Telephone Encounter (Signed)
Please review for refill. Thanks!  

## 2016-08-26 ENCOUNTER — Institutional Professional Consult (permissible substitution) (INDEPENDENT_AMBULATORY_CARE_PROVIDER_SITE_OTHER): Payer: Medicare Other | Admitting: Surgery

## 2016-08-26 ENCOUNTER — Encounter: Payer: Self-pay | Admitting: Physical Therapy

## 2016-08-26 ENCOUNTER — Encounter: Payer: Self-pay | Admitting: Surgery

## 2016-08-26 ENCOUNTER — Ambulatory Visit: Payer: Medicare Other | Attending: Surgery | Admitting: Physical Therapy

## 2016-08-26 VITALS — BP 107/73 | HR 80 | Resp 16 | Ht 71.0 in | Wt 210.0 lb

## 2016-08-26 DIAGNOSIS — M6281 Muscle weakness (generalized): Secondary | ICD-10-CM | POA: Diagnosis not present

## 2016-08-26 DIAGNOSIS — Z992 Dependence on renal dialysis: Secondary | ICD-10-CM | POA: Diagnosis not present

## 2016-08-26 DIAGNOSIS — R2689 Other abnormalities of gait and mobility: Secondary | ICD-10-CM | POA: Diagnosis not present

## 2016-08-26 DIAGNOSIS — I350 Nonrheumatic aortic (valve) stenosis: Secondary | ICD-10-CM | POA: Diagnosis not present

## 2016-08-26 DIAGNOSIS — N186 End stage renal disease: Secondary | ICD-10-CM | POA: Diagnosis not present

## 2016-08-26 DIAGNOSIS — E1129 Type 2 diabetes mellitus with other diabetic kidney complication: Secondary | ICD-10-CM | POA: Diagnosis not present

## 2016-08-26 NOTE — Therapy (Signed)
[place], [place], [address] Phone: [phone]   Fax:  [phone]  Physical Therapy Evaluation  Patient Details  Name: Alexander [name] MRN: [id number] Date of Birth: 1940/04/03 Referring Provider: Dr. Gilford Raid  Encounter Date: 08/26/2016      PT End of Session - 08/26/16 1342    Visit Number 1   PT Start Time 1330   PT Stop Time 1425   PT Time Calculation (min) 55 min   Equipment Utilized During Treatment Gait belt      Past Medical History:  Diagnosis Date  . Anemia   . Aortic stenosis 06/15/12   TEE - EF 27-51%; grade 1 diastolic dysfunction; mild/mod aortic valve stenosis; Mitral valve had calcified annulus, mild pulm htn PA peak pressure 45mmHg  . Barrett's esophagus 05/2003  . Bradycardia 2017   [employer] 2240 Assurity dual-lead pacemaker  . Carpal tunnel syndrome, bilateral 11/03/2015  . Colon polyps   . CVA (cerebral infarction)    2004/affected left side  . Depression   . Diabetes mellitus without complication ([place])   . Diabetic peripheral neuropathy ([place]) 10/02/2015  . Diverticulosis   . End stage renal disease ([place])    hemodialysis 3 times a week  . GERD (gastroesophageal reflux disease)   . Hyperlipidemia   . Hypertension   . Kidney stones   . Macular degeneration    both eyes  . Orthostatic hypotension 09/09/2015  . Paroxysmal atrial fibrillation ([facility])   . Peptic ulcer    bleeding, 1969  . S/P epidural steroid injection    last  injection over 10 years ago  . Seasonal allergies   . Tubular adenoma of colon 07/2001    Past Surgical History:  Procedure Laterality Date  . AV FISTULA PLACEMENT  2009  . BACK SURGERY    . CARDIOVERSION N/A 11/13/2015   Procedure: CARDIOVERSION;  Surgeon: Troy Sine, MD;  Location: [facility] ENDOSCOPY;  Service: Cardiovascular;  Laterality: N/A;  . CARDIOVERSION N/A 01/13/2016   Procedure: CARDIOVERSION;  Surgeon: Will Meredith [name], MD;   Location: [facility];  Service: Cardiovascular;  Laterality: N/A;  . [place]   right eye  . CYSTOSCOPY  several times   kidney stones  . EP IMPLANTABLE DEVICE N/A 03/11/2015   Procedure: Pacemaker Implant;  Surgeon: Will Meredith [name], MD;  [place];  Laterality: Left  . LAMINECTOMY  1969  . RIGHT/LEFT HEART CATH AND CORONARY ANGIOGRAPHY N/A 08/20/2016   Procedure: Right/Left Heart Cath and Coronary Angiography;  Surgeon: Burnell Blanks, MD;  Location: [facility];  Service: Cardiovascular;  Laterality: N/A;  . TEE WITHOUT CARDIOVERSION N/A 07/22/2016   Procedure: TRANSESOPHAGEAL ECHOCARDIOGRAM (TEE);  Surgeon: Skeet Latch, MD;  Location: [facility] ENDOSCOPY;  Service: Cardiovascular;  Laterality: N/A;  . TONSILLECTOMY  1964    There were no vitals filed for this visit.       Subjective Assessment - 08/26/16 1335    Subjective Pt reports worsening shortness of breath with exertion, dizziness, syncope and chest pressure with exertion. Taking out the trash tends to get him short of breath.    Pertinent History pacemaker, ESRD with HD   Patient Stated Goals to fix your heart   Currently in Pain? No/denies            [facility] PT Assessment - 08/26/16 0001      Assessment   Medical Diagnosis severe aortic stenosis  Referring Provider Dr. Gilford Raid   Onset Date/Surgical Date --  over the last year     Precautions   Precaution Comments NO BP LUE, low vision     Restrictions   Weight Bearing Restrictions No     Balance Screen   Has the patient fallen in the past 6 months No   Has the patient had a decrease in activity level because of a fear of falling?  No   Is the patient reluctant to leave their home because of a fear of falling?  No     Home [employer] residence   Living Arrangements Spouse/significant other   [place] to enter   Entrance Stairs-Number of Steps 1  small    Entrance Stairs-Rails None   Home Layout One level     Prior Function   Level of Independence Independent with household mobility without device  needs supervision in community due to vision     ROM / Strength   AROM / PROM / Strength AROM;Strength     AROM   Overall AROM Comments grossly WNL     Strength   Overall Strength Comments grossly 5/5 R side and 4/5 L side   Strength Assessment Site Hand   Right/Left hand Right;Left   Right Hand Grip (lbs) 69  R hand dominant   Left Hand Grip (lbs) 55     Ambulation/Gait   Gait Pattern Decreased step length - right;Decreased step length - left   Gait Comments CGA due to vision. Pt's distance limited by 74% for age/gender.      6 Minute walk- Post Test   BP (mmHg) (!)  133/109  93/71 after 5 minutes rest          [facility] Pre-Surgical Assessment - 08/26/16 0001    5 Meter Walk Test- trial 1 11 sec   5 Meter Walk Test- trial 2 10 sec.    5 Meter Walk Test- trial 3 9 sec.   5 meter walk test average 10 sec   4 Stage Balance Test tolerated for:  10 sec.   4 Stage Balance Test Position 2   Sit To Stand Test- trial 1 17 sec.   Comment increased dizziness and shortness of breath   ADL/IADL Independent with: Bathing;Dressing   ADL/IADL Needs Assistance with: Meal prep;Finances;Yard work   ADL/IADL Therapist, sports Index Midly frail   6 Minute Walk- Baseline yes   BP (mmHg) 126/74   HR (bpm) 117   02 Sat (%RA) 97 %   Modified Borg Scale for Dyspnea 0- Nothing at all   Perceived Rate of Exertion (Borg) 6-   6 Minute Walk Post Test yes   HR (bpm) 120  drops to 96 during ambulation   02 Sat (%RA) 96 %   Modified Borg Scale for Dyspnea 2- Mild shortness of breath   Perceived Rate of Exertion (Borg) 13- Somewhat hard   Aerobic Endurance Distance Walked 448          Objective measurements completed on examination: See above findings.                  PT Education - 08/26/16 1427    Education provided Yes   Education  Details fall risk   Person(s) Educated Patient   Methods Explanation   Comprehension Verbalized understanding                     Plan - 08/26/16  1427    Clinical Impression Statement see below   PT Frequency One time visit   Consulted and Agree with Plan of Care Patient     Clinical Impression Statement: Pt is a 76 yo male presenting to OP PT for evaluation prior to possible TAVR surgery due to severe aortic stenosis. Pt reports onset of progressive shortness of breath with activity, dizziness and chest pressure with activity over the past year. Symptoms are limiting ability to perform household tasks like taking out the trash. Pt presents with good ROM and strength with mild weakness in L side likely residual of CVA, poor balance and is at high fall risk 4 stage balance test, slow walking speed and poor aerobic endurance per 6 minute walk test. Pt ambulated 380 feet in 3:45 before requesting a seated rest beak lasting 1:30. Pt able to resume after rest and ambulate an additional 78 feet. Pt ambulated a total of 448 feet in 6 minute walk. BP increased significantly with 6 minute walk test, increasing to 133/109. It recovered after 5 minutes. Based on the Short Physical Performance Battery, patient has a frailty rating of 4/12 with </= 5/12 considered frail.    Patient demonstrated the following deficits and impairments:     Visit Diagnosis: Other abnormalities of gait and mobility  Muscle weakness (generalized)      G-Codes - 09/17/16 1428    Functional Assessment Tool Used (Outpatient Only) 6 minute walk 448'   Functional Limitation Mobility: Walking and moving around   Mobility: Walking and Moving Around Current Status [phone]) At least 60 percent but less than 80 percent impaired, limited or restricted   Mobility: Walking and Moving Around Goal Status [phone]) At least 60 percent but less than 80 percent impaired, limited or restricted   Mobility: Walking and Moving Around  Discharge Status [phone]) At least 60 percent but less than 80 percent impaired, limited or restricted       Problem List Patient Active Problem List   Diagnosis Date Noted  . Long term (current) use of anticoagulants [Z79.01] 08/13/2016  . Persistent atrial fibrillation ([place])   . Carpal tunnel syndrome, bilateral 11/03/2015  . Hand pain 10/02/2015  . Diabetic peripheral neuropathy ([place]) 10/02/2015  . Orthostatic hypotension 09/09/2015  . Bradycardia 03/11/2015  . End stage renal disease ([place]) 01/28/2015  . Left arm pain 01/28/2015  . Long-term (current) use of anticoagulants 04/08/2014  . Acute bronchitis 04/05/2014  . Hyperlipidemia 04/04/2014  . ESRD (end stage renal disease) on dialysis 03/13/2013  . PAD (peripheral artery disease) ([place]) 12/12/2012  . Chest pain 12/08/2012  . Severe aortic stenosis 06/15/2012  . Personal history of colonic polyps 02/03/2011  . Esophageal reflux 02/03/2011  . Anemia, unspecified 02/03/2011    NICOLETTA,DANA, PT 09/17/2016, 2:29 PM  [facility] [address] [place], [place], [address] Phone: [phone]   Fax:  [phone]  Name: Alexander [name] MRN: [id number] Date of Birth: [birth year]

## 2016-08-26 NOTE — Telephone Encounter (Signed)
Refill Request.  

## 2016-08-26 NOTE — Progress Notes (Signed)
Patient ID: Alexander [name], male   DOB: 10/12/1940, 77 y.o.   MRN: [id number]  Alexander [name] SURGERY CONSULTATION REPORT  Referring Provider is Skeet Latch, MD PCP is Deland Pretty, MD  Chief Complaint  Patient presents with  . Aortic Stenosis    Surgical eval for TAVR, review all studies    HPI:  The patient is a 76 year old gentleman with diabetes, hypertension, hyperlipidemia, ESRD on HD, PAF on Coumadin, prior stroke in 2004 with some left sided weakness and aphasia, bradycardia s/p PPM and aortic stenosis. Over the past year he has noted progressive exertional shortness of breath and fatigue. Recently this has been occurring with walking around his house. He has had occasional chest pressure with exertion and frequent dizziness with some episodes of syncope and falls. He has had some problems with hypotension on dialysis and takes Midodrine on his dialysis days. An echo on 06/21/2016 showed a normal LVEF of 60-65%. The aortic valve is trileaflet with severe leaflet calcification with a mean gradient of 33 mm Hg and a peak of 61 mm Hg. A nuclear stress test on 07/08/2016 showed an LVEF of 55-65% with no ischemia. He underwent a TEE by [name] on 4/26 which showed a mean AV gradient of 25 mm Hg and a peak of 40 mm Hg. LVEF was normal. He was referred to Dr. Angelena Form for consideration of TAVR and cath was done on 5/25 showing only mild non-obstructive CAD. The mean AV gradient was only measured at 14.9 mm Hg.  The patient says that he is mainly limited by his shortness of breath and fatigue with exertion. He is legally blind due to macular degeneration which has reduced his independent mobility but he still tries to be active. He lives at home with his wife who is about to start chemotherapy for cancer. He has three daughters and one is with him today along with his wife.   Past Medical History:  Diagnosis Date  .  Anemia   . Aortic stenosis 06/15/12   TEE - EF 63-84%; grade 1 diastolic dysfunction; mild/mod aortic valve stenosis; Mitral valve had calcified annulus, mild pulm htn PA peak pressure 59mmHg  . Barrett's esophagus 05/2003  . Bradycardia 2017   [employer] 2240 Assurity dual-lead pacemaker  . Carpal tunnel syndrome, bilateral 11/03/2015  . Colon polyps   . CVA (cerebral infarction)    2004/affected left side  . Depression   . Diabetes mellitus without complication ([place])   . Diabetic peripheral neuropathy ([place]) 10/02/2015  . Diverticulosis   . End stage renal disease ([place])    hemodialysis 3 times a week  . GERD (gastroesophageal reflux disease)   . Hyperlipidemia   . Hypertension   . Kidney stones   . Macular degeneration    both eyes  . Orthostatic hypotension 09/09/2015  . Paroxysmal atrial fibrillation ([facility])   . Peptic ulcer    bleeding, 1969  . S/P epidural steroid injection    last  injection over 10 years ago  . Seasonal allergies   . Tubular adenoma of colon 07/2001    Past Surgical History:  Procedure Laterality Date  . AV FISTULA PLACEMENT  2009  . BACK SURGERY    . CARDIOVERSION N/A 11/13/2015   Procedure: CARDIOVERSION;  Surgeon: Troy Sine, MD;  Location: [facility] ENDOSCOPY;  Service: Cardiovascular;  Laterality: N/A;  . CARDIOVERSION N/A 01/13/2016   Procedure: CARDIOVERSION;  Surgeon: Will  Meredith [name], MD;  Location: [facility] ENDOSCOPY;  Service: Cardiovascular;  Laterality: N/A;  . [place]   right eye  . CYSTOSCOPY  several times   kidney stones  . EP IMPLANTABLE DEVICE N/A 03/11/2015   Procedure: Pacemaker Implant;  Surgeon: Will Meredith [name], MD;  [place];  Laterality: Left  . LAMINECTOMY  1969  . RIGHT/LEFT HEART CATH AND CORONARY ANGIOGRAPHY N/A 08/20/2016   Procedure: Right/Left Heart Cath and Coronary Angiography;  Surgeon: Burnell Blanks, MD;  Location: [facility] CV LAB;  Service: Cardiovascular;   Laterality: N/A;  . TEE WITHOUT CARDIOVERSION N/A 07/22/2016   Procedure: TRANSESOPHAGEAL ECHOCARDIOGRAM (TEE);  Surgeon: Skeet Latch, MD;  Location: [facility] ENDOSCOPY;  Service: Cardiovascular;  Laterality: N/A;  . TONSILLECTOMY  1964    Family History  Problem Relation Age of Onset  . Stomach cancer Mother   . Hypertension Father        Died of heart attack  . Heart attack Father   . Stroke Sister   . Heart disease Sister   . Cancer Brother   . Colon cancer Neg Hx     Social History   Social History  . Marital status: Married    Spouse name: N/A  . Number of children: 3  . Years of education: 14   Occupational History  . 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 Smoking status: Former Smoker    Packs/day: 2.00    Years: 45.00    Types: Cigarettes    Quit date: 01/20/1998  . Smokeless tobacco: Former Systems developer  . Alcohol use No  . Drug use: No  . Sexual activity: Yes   Other Topics Concern  . Not on file   Social History Narrative   Lives at home w/ his wife   Right-handed   Drinks 1 cup of coffee per day    Current Outpatient Prescriptions  Medication Sig Dispense Refill  . atorvastatin (LIPITOR) 40 MG tablet Take 20 mg by mouth daily.    [name] b complex-vitamin c-folic acid (NEPHRO-VITE) 0.8 MG TABS Take 1 tablet by mouth daily.     . bromocriptine (PARLODEL) 5 MG capsule Take 5 mg by mouth at bedtime.     . calcium acetate (PHOSLO) 667 MG capsule Take 667 mg by mouth 4 (four) times daily.     . cetirizine (ZYRTEC) 10 MG tablet Take 10 mg by mouth at bedtime.     . dorzolamide-timolol (COSOPT) 22.3-6.8 MG/ML ophthalmic solution Place 1 drop into the right eye 2 (two) times daily.    [name] FLUoxetine (PROZAC) 20 MG capsule Take 40 mg by mouth daily.     . metoprolol tartrate (LOPRESSOR) 25 MG tablet TAKE 1 TABLET BY MOUTH TWICE DAILY 180 tablet 3  . midodrine (PROAMATINE) 5 MG tablet Take 15 mg by mouth 3 (three) times daily as needed (for systolic  blood pressure less than 130).     [name] omeprazole (PRILOSEC) 20 MG capsule Take 20 mg by mouth daily.    . predniSONE (DELTASONE) 5 MG tablet Take 5 mg by mouth daily.   1  . SENSIPAR 30 MG tablet Take 30 mg by mouth at bedtime.   5  . warfarin (COUMADIN) 5 MG tablet Take 1-1.5 tablets by mouth daily as directed by coumadin clinic 120 tablet 1  . enoxaparin (LOVENOX) 80 MG/0.8ML injection Inject 0.8 mLs (80 mg total) into the skin daily. (Patient not taking: Reported on  08/26/2016) 10 Syringe 0  . midodrine (PROAMATINE) 5 MG tablet TAKE 3 TABLETS BY MOUTH THREE TIMES DAILY AS NEEDED. (Patient not taking: Reported on 08/26/2016) 270 tablet 1   No current facility-administered medications for this visit.     Allergies  Allergen Reactions  . Codeine Nausea Only  . Penicillins Swelling    Has patient had a PCN reaction causing immediate rash, facial/tongue/throat swelling, SOB or lightheadedness with hypotension: Yes Has patient had a PCN reaction causing severe rash involving mucus membranes or skin necrosis: No Has patient had a PCN reaction that required hospitalization No Has patient had a PCN reaction occurring within the last 10 years: No If all of the above answers are "NO", then may proceed with Cephalosporin use.  . Tramadol Nausea Only      Review of Systems:   General:  good appetite, poor energy, no weight gain, no weight loss, no fever  Cardiac:  has chest pressure with exertion, no chest pressure at rest, has SOB with minimal exertion, no resting SOB, no PND, no orthopnea, has palpitations, has arrhythmia, paroxysmal atrial fibrillation, has LE edema, has dizzy spells, has had syncope  Respiratory:  exertional shortness of breath, no home oxygen, has productive cough, no dry cough, no bronchitis, no wheezing, no hemoptysis, no asthma, no pain with inspiration or cough, no sleep apnea, no CPAP at night  GI:   no difficulty swallowing, has reflux, no frequent heartburn, no hiatal  hernia, no abdominal pain, no constipation, no diarrhea, no hematochezia, no hematemesis, no melena  GU:   no dysuria,  no frequency, no urinary tract infection, no hematuria, no enlarged prostate, has kidney stones, chronic kidney disease  Vascular:  no pain suggestive of claudication, no pain in feet, has leg cramps, no varicose veins, no DVT, no non-healing foot ulcer  Neuro:   prior stroke with some mild residual left sided weakness and mild receptive aphasia, no TIA's, no seizures, no headaches, no temporary blindness one eye,  no slurred speech, no peripheral neuropathy, has chronic pain, some instability of gait, some memory/cognitive dysfunction  Musculoskeletal: no arthritis, no joint swelling, has myalgias, some difficulty walking, reduced mobility   Skin:   no rash, no itching, no skin infections, no pressure sores or ulcerations  Psych:   no anxiety, some depression, no nervousness, has unusual recent stress due to wife's cancer diagnosis.  Eyes:   Blind from macular degeneration  ENT:   no hearing loss, no loose or painful teeth, no dentures, last saw dentist every 6 months  Hematologic:  has easy bruising on Coumadin, no abnormal bleeding, no clotting disorder, no frequent epistaxis  Endocrine:  has diabetes, does not check CBG's at home, only at dialysis         Physical Exam:   BP 107/73 (BP Location: Right Arm, Patient Position: Sitting, Cuff Size: Large)   Pulse 80   Resp 16   Ht 5\' 11"  (1.803 m)   Wt 210 lb (95.3 kg)   SpO2 98% Comment: RA  BMI 29.29 kg/m   General:  Elderly but  well-appearing  HEENT:  Unremarkable, NCAT, Corneal haze, EOMI, oropharynx clear, teeth appear in fair condition  Neck:   no JVD, no bruits, no adenopathy or thyromegaly  Chest:   clear to auscultation, symmetrical breath sounds, no wheezes, no rhonchi   CV:   RRR, grade III/VI crescendo/decrescendo murmur heard best at RSB,  no diastolic murmur  Abdomen:  soft, non-tender, no masses or  organomegaly  Extremities:  warm, well-perfused, pulses palpable but diminished dp and pt, minimal LE edema in ankles. Left arm AV fistula with strong thrill  Rectal/GU  Deferred  Neuro:   Grossly non-focal and symmetrical throughout  Skin:   Clean and dry, no rashes, no breakdown   Diagnostic Tests:    [facility]                        [address], [place] [address]                            [phone]  ------------------------------------------------------------------- Transthoracic Echocardiography  Patient:    Alexander [name], Alexander [name] MR #:       [id number] Study Date: 06/21/2016 Gender:     M Age:        63 Height:     179.1 cm Weight:     96.6 kg BSA:        2.22 m^2 Pt. Status: Room:   SONOGRAPHER  Oletta Lamas, Will  ORDERING     Barrett, Evelene Croon  REFERRING    Barrett, Evelene Croon  ATTENDING    Loralie Champagne, M.D.  PERFORMING   [facility], Outpatient  cc:  ------------------------------------------------------------------- LV EF: 60% -   65%  ------------------------------------------------------------------- Indications:      (I48.1).  ------------------------------------------------------------------- History:   PMH:  ESRD. AS. Acquired from the patient and from the patient&'s chart.  Atrial fibrillation.  Atrial fibrillation.  Risk factors:  Hypertension. Diabetes mellitus. Dyslipidemia.  ------------------------------------------------------------------- Study Conclusions  - Left ventricle: The cavity size was normal. Wall thickness was   increased in a pattern of mild LVH. Systolic function was normal.   The estimated ejection fraction was in the range of 60% to 65%.   Indeterminant diastolic function (atrial fibrillation). Wall   motion was normal; there were no regional wall motion   abnormalities. - Aortic valve: Trileaflet; severely calcified leaflets. There was   moderate to severe stenosis. There was trivial  regurgitation.   Mean gradient (S): 33 mm Hg. Peak gradient (S): 61 mm Hg. Valve   area (VTI): 0.81 cm^2. - Aorta: Ascending aortic diameter: 37 mm (S). - Ascending aorta: The ascending aorta was borderline dilated. - Mitral valve: Moderately calcified annulus. There was mild   regurgitation. - Left atrium: The atrium was moderately dilated. - Right ventricle: The cavity size was normal. Systolic function   was normal. - Right atrium: The atrium was moderately dilated. - Tricuspid valve: Peak RV-RA gradient (S): 35 mm Hg. - Pulmonary arteries: PA peak pressure: 43 mm Hg (S). - Systemic veins: IVC measured 2.0 cm with < 50% respirophasic   variation, suggesting RA pressure 8 mmHg.  Impressions:  - The patient was in atrial fibrillation. Normal LV size with mild   LV hypertrophy. EF 60-65%. Normal RV size and systolic function.   Moderate biatrial enlargement. The aortic valve was severely   calcified with moderate to severe stenosis (moderate by mean   gradient, severe by calculated valve area). Would consider TEE to   more closely assess the aortic valve.  ------------------------------------------------------------------- Labs, prior tests, procedures, and surgery: Permanent pacemaker system implantation.  ------------------------------------------------------------------- Study data:  Comparison was made to the study of 11/28/2014.  Study status:  Routine.  Procedure:  The  patient reported no pain pre or post test. Transthoracic echocardiography for left ventricular function evaluation and for assessment of valvular function. Image quality was adequate.  Study completion:  There were no complications.          Transthoracic echocardiography.  M-mode, complete 2D, spectral Doppler, and color Doppler.  Birthdate: Patient birthdate: 1940-12-09.  Age:  Patient is 76 yr old.  Sex: Gender: male.    BMI: 30.1 kg/m^2.  Blood pressure:     126/70 Patient status:  Outpatient.   Study date:  Study date: 06/21/2016. Study time: 02:17 PM.  Location:  [facility]  -------------------------------------------------------------------  ------------------------------------------------------------------- Left ventricle:  The cavity size was normal. Wall thickness was increased in a pattern of mild LVH. Systolic function was normal. The estimated ejection fraction was in the range of 60% to 65%. Indeterminant diastolic function (atrial fibrillation). Wall motion was normal; there were no regional wall motion abnormalities.  ------------------------------------------------------------------- Aortic valve:   Trileaflet; severely calcified leaflets.  Doppler:  There was moderate to severe stenosis.   There was trivial regurgitation.    VTI ratio of LVOT to aortic valve: 0.67. Valve area (VTI): 0.81 cm^2. Indexed valve area (VTI): 1.05 cm^2/m^2. Peak velocity ratio of LVOT to aortic valve: 1.03. Indexed valve area (Vmax): 1.6 cm^2/m^2. Mean velocity ratio of LVOT to aortic valve: 0.67.    Mean gradient (S): 33 mm Hg. Peak gradient (S): 61 mm Hg.  ------------------------------------------------------------------- Aorta:  Aortic root: The aortic root was normal in size. Ascending aorta: The ascending aorta was borderline dilated.  ------------------------------------------------------------------- Mitral valve:   Moderately calcified annulus.  Doppler:   There was no evidence for stenosis.   There was mild regurgitation.    Peak gradient (D): 14 mm Hg.  ------------------------------------------------------------------- Left atrium:  The atrium was moderately dilated.  ------------------------------------------------------------------- Right ventricle:  The cavity size was normal. Systolic function was normal.  ------------------------------------------------------------------- Pulmonic valve:    Structurally normal valve.   Cusp separation was normal.   Doppler:  Transvalvular velocity was within the normal range. There was trivial regurgitation.  ------------------------------------------------------------------- Tricuspid valve:   Doppler:  There was mild regurgitation.  ------------------------------------------------------------------- Right atrium:  The atrium was moderately dilated.  ------------------------------------------------------------------- Pericardium:  There was no pericardial effusion.  ------------------------------------------------------------------- Systemic veins:  IVC measured 2.0 cm with < 50% respirophasic variation, suggesting RA pressure 8 mmHg.  ------------------------------------------------------------------- Measurements   Left ventricle                            Value          Reference  LV ID, ED, PLAX chordal                   45.6  mm       43 - 52  LV ID, ES, PLAX chordal                   29.1  mm       23 - 38  LV fx shortening, PLAX chordal            36    %        >=29  LV PW thickness, ED                       14.1  mm       ---------  IVS/LV PW ratio, ED  0.99           <=1.3  Stroke volume, 2D                         171   ml       ---------  Stroke volume/bsa, 2D                     77    ml/m^2   ---------    Ventricular septum                        Value          Reference  IVS thickness, ED                         14    mm       ---------    LVOT                                      Value          Reference  LVOT ID, S                                21    mm       ---------  LVOT area                                 3.46  cm^2     ---------  LVOT ID                                   21    mm       ---------  LVOT peak velocity, S                     391   cm/s     ---------  LVOT mean velocity, S                     163   cm/s     ---------  LVOT VTI, S                               49.4  cm       ---------  LVOT peak gradient, S                      61    mm Hg    ---------  Stroke volume (SV), LVOT DP               171.1 ml       ---------  Stroke index (SV/bsa), LVOT DP            77.2  ml/m^2   ---------    Aortic valve                              Value          Reference  Aortic  valve peak velocity, S             381   cm/s     ---------  Aortic valve mean velocity, S             242   cm/s     ---------  Aortic valve VTI, S                       73.7  cm       ---------  Aortic mean gradient, S                   28    mm Hg    ---------  Aortic peak gradient, S                   58    mm Hg    ---------  VTI ratio, LVOT/AV                        0.67           ---------  Aortic valve area, VTI                    2.32  cm^2     ---------  Aortic valve area/bsa, VTI                1.05  cm^2/m^2 ---------  Velocity ratio, peak, LVOT/AV             1.03           ---------  Aortic valve area/bsa, peak               1.6   cm^2/m^2 ---------  velocity  Velocity ratio, mean, LVOT/AV             0.67           ---------  Aortic regurg pressure half-time          401   ms       ---------    Aorta                                     Value          Reference  Aortic root ID, ED                        36    mm       ---------  Ascending aorta ID, A-P, S                37    mm       ---------    Left atrium                               Value          Reference  LA ID, A-P, ES                            46    mm       ---------  LA ID/bsa, A-P  2.08  cm/m^2   <=2.2  LA volume, S                              115   ml       ---------  LA volume/bsa, S                          51.9  ml/m^2   ---------  LA volume, ES, 1-p A4C                    100   ml       ---------  LA volume/bsa, ES, 1-p A4C                45.1  ml/m^2   ---------  LA volume, ES, 1-p A2C                    116   ml       ---------  LA volume/bsa, ES, 1-p A2C                52.3  ml/m^2   ---------    Mitral valve                               Value          Reference  Mitral E-wave peak velocity               190   cm/s     ---------  Mitral deceleration time          (H)     282   ms       150 - 230  Mitral peak gradient, D                   14    mm Hg    ---------    Pulmonary arteries                        Value          Reference  PA pressure, S, DP                (H)     43    mm Hg    <=30    Tricuspid valve                           Value          Reference  Tricuspid regurg peak velocity            296   cm/s     ---------  Tricuspid peak RV-RA gradient             35    mm Hg    ---------    Systemic veins                            Value          Reference  Estimated CVP                             8     mm Hg    ---------  Right ventricle                           Value          Reference  RV pressure, S, DP                (H)     43    mm Hg    <=30  Legend: (L)  and  (H)  mark values outside specified reference range.  ------------------------------------------------------------------- Prepared and Electronically Authenticated by  Loralie Champagne, M.D. 2018-03-26T21:44:38  Physicians   Panel Physicians Referring Physician Case Authorizing Physician  Burnell Blanks, MD (Primary)    Procedures   Right/Left Heart Cath and Coronary Angiography  Conclusion     Prox Cx to Mid Cx lesion, 20 %stenosed.  Ost 3rd Mrg to 3rd Mrg lesion, 20 %stenosed.  Prox LAD to Mid LAD lesion, 20 %stenosed.  Prox RCA to Dist RCA lesion, 10 %stenosed.   1. Mild non-obstructive CAD 2. Aortic stenosis (the mean gradient by cath is 14.9 mmHg). The mean gradient by echo is 40 mmHg and the valve is heavily calcified with poor leaflet excursion.   Recommendation: Continue planning for TAVR. He is scheduled to see our CT surgeons.    Indications   Severe aortic stenosis [I35.0 (ICD-10-CM)]  Procedural Details/Technique   Technical Details Indication: 76 yo male with severe AS, ESRD on HD, normal LV  function. Workup for TAVR  Procedure: The risks, benefits, complications, treatment options, and expected outcomes were discussed with the patient. The patient and/or family concurred with the proposed plan, giving informed consent. The patient was further sedated with Versed and Fentanyl. The right groin was prepped and draped in the usual manner. Using the modified Seldinger access technique, a 5 French sheath was placed in the right femoral artery. A 7 French sheath was placed in the right femoral vein. Right heart cath performed with a balloon tipped catheter. Standard diagnostic catheters were used to perform selective coronary angiography. I crossed his aortic valve with an AL-2 catheter and straight wire. No left ventricular angiogram was performed.  There were no immediate complications. The patient was taken to the recovery area in stable condition. \   Estimated blood loss <50 mL.  During this procedure the patient was administered the following to achieve and maintain moderate conscious sedation: Versed 0.5 mg, Fentanyl mcg, while the patient's heart rate, blood pressure, and oxygen saturation were continuously monitored. The period of conscious sedation was 32 minutes, of which I was present face-to-face 100% of this time.    Complications   Complications documented before study signed (08/20/2016 3:52 PM EDT)    RIGHT/LEFT HEART CATH AND CORONARY ANGIOGRAPHY   None Documented by Burnell Blanks, MD 08/20/2016 3:51 PM EDT  Time Range: Intra-procedure      Coronary Findings   Dominance: Right  Left Anterior Descending  Vessel is large.  Prox LAD to Mid LAD lesion, 20% stenosed.  First Diagonal Branch  Vessel is moderate in size.  First Septal Branch  Vessel is small in size.  Second Diagonal Branch  Vessel is small in size.  Second Septal Branch  Vessel is small in size.  Third Diagonal Branch  Vessel is small in size.  Third Septal Branch  Vessel is small in  size.  Left Circumflex  Vessel is large.  Prox Cx to Mid Cx lesion, 20% stenosed.  First Obtuse Marginal Branch  Vessel  is small in size.  Second Obtuse Marginal Branch  Vessel is small in size.  Third Obtuse Marginal Branch  Vessel is large in size.  Ost 3rd Mrg to 3rd Mrg lesion, 20% stenosed.  Right Coronary Artery  Prox RCA to Dist RCA lesion, 10% stenosed.  Coronary Diagrams   Diagnostic Diagram       Implants     No implant documentation for this case.  PACS Images   Show images for Cardiac catheterization   Link to Procedure Log   Procedure Log    Hemo Data    Most Recent Value  Fick Cardiac Output 6.14 L/min  Fick Cardiac Output Index 2.91 (L/min)/BSA  Aortic Mean Gradient 14.9 mmHg  Aortic Peak Gradient 7 mmHg  Aortic Valve Area 2.63  Aortic Value Area Index 1.25 cm2/BSA  RA A Wave 15 mmHg  RA V Wave 16 mmHg  RA Mean 14 mmHg  RV Systolic Pressure 39 mmHg  RV Diastolic Pressure 9 mmHg  RV EDP 12 mmHg  PA Systolic Pressure 49 mmHg  PA Diastolic Pressure 23 mmHg  PA Mean 33 mmHg  PW A Wave 25 mmHg  PW V Wave 20 mmHg  PW Mean 23 mmHg  AO Systolic Pressure 440 mmHg  AO Diastolic Pressure 70 mmHg  AO Mean 93 mmHg  LV Systolic Pressure 347 mmHg  LV Diastolic Pressure 12 mmHg  LV EDP 14 mmHg  Arterial Occlusion Pressure Extended Systolic Pressure 425 mmHg  Arterial Occlusion Pressure Extended Diastolic Pressure 68 mmHg  Arterial Occlusion Pressure Extended Mean Pressure 90 mmHg  Left Ventricular Apex Extended Systolic Pressure 956 mmHg  Left Ventricular Apex Extended Diastolic Pressure 7 mmHg  Left Ventricular Apex Extended EDP Pressure 20 mmHg  QP/QS 1  TPVR Index 11.34 HRUI  TSVR Index 31.95 HRUI  PVR SVR Ratio 0.13  TPVR/TSVR Ratio 0.35      *[facility]*                         [address], [place] [address]                             [phone]  ------------------------------------------------------------------- Noninvasive Vascular Lab  Carotid Duplex Study  Patient:    Alexander [name], Alexander [name] MR #:       [id number] Study Date: 08/24/2016 Gender:     M Age:        7 Height:     180.3 cm Weight:     90.7 kg BSA:        2.15 m^2 Pt. Status: Room:   ATTENDING    [place], [place]  SONOGRAPHER  Oda Cogan, BS, RDMS, RVT  Reports also to:  ------------------------------------------------------------------- History and indications:  History  Pre-op evaluation Patient present with history of known carotid disease. Last study 12/05/12 showed right ICA 50-69% stenosis and left ICA chronic occlusion. Prior study from 12/05/12 is available for comparison. Prior R ICA results: 50-69% Prior L ICA results: Occlusion  ------------------------------------------------------------------- Study information:  Study status:  Routine.  Procedure:  The right CCA, right ECA, right ICA, right vertebral, left CCA, left ECA, left ICA, and left vertebral arteries were examined. A vascular evaluation was performed with the patient in the supine position. Image quality was adequate.    Carotid duplex study.     Carotid Duplex exam including 2D imaging, color and spectral Doppler were performed on the extracranial carotid and vertebral arteries using standard established protocols.  Birthdate:  Patient birthdate: May 01, 1940. Age:  Patient is 76 yr old.  Sex:  Gender: male.  Height:  Height: 180.3 cm. Height: 71 in.  Weight:  Weight: 90.7 kg. Weight: 199.6 lb.  Body mass index:  BMI: 27.9 kg/m^2.  Body surface area: BSA: 2.15 m^2.  Study date:  Study date: 08/24/2016. Study time: 02:09 PM.  Location:  Vascular laboratory.  Patient status: Outpatient.  Arterial  flow:  +--------------+---------+--------+--------------+----------------+ !Location      !V sys    !V ed    !Flow analysis !Comment         ! +--------------+---------+--------+--------------+----------------+ !Right CCA -   !66 cm/s  !30 cm/s !--------------!----------------! !proximal      !         !        !              !                ! +--------------+---------+--------+--------------+----------------+ !Right CCA -   !41 cm/s  !12 cm/s !--------------!----------------! !distal        !         !        !              !                ! +--------------+---------+--------+--------------+----------------+ !Right ECA     !-46 cm/s !-11 cm/s!--------------!----------------! +--------------+---------+--------+--------------+----------------+ !Right ICA -   !-70 cm/s !-33 cm/s!--------------!----------------! !proximal      !         !        !              !                ! +--------------+---------+--------+--------------+----------------+ !Right ICA -   !142 cm/s !56 cm/s !--------------!moderate        ! !mid           !         !        !              !heterogeneous   ! !              !         !        !              !plaque with     ! !              !         !        !              !tortuosity of   ! !              !         !        !              !the vessel.     ! +--------------+---------+--------+--------------+----------------+ !  Right ICA -   !-130 cm/s!-30 cm/s!--------------!tortuosity of   ! !distal        !         !        !              !the vessel.     ! +--------------+---------+--------+--------------+----------------+ !Right         !-64 cm/s !-18 cm/s!Antegrade flow!----------------! !vertebral     !         !        !              !                ! +--------------+---------+--------+--------------+----------------+ !Right         !75 cm/s  !--------!--------------!----------------! !subclavian -  !         !        !              !                 ! !proximal      !         !        !              !                ! +--------------+---------+--------+--------------+----------------+ !Left CCA -    !28 cm/s  !--------!--------------!----------------! !proximal      !         !        !              !                ! +--------------+---------+--------+--------------+----------------+ !Left CCA - mid!43 cm/s  !--------!--------------!----------------! +--------------+---------+--------+--------------+----------------+ !Left CCA -    !29 cm/s  !--------!--------------!----------------! !distal        !         !        !              !                ! +--------------+---------+--------+--------------+----------------+ !Left ECA      !-45 cm/s !-9 cm/s !--------------!----------------! +--------------+---------+--------+--------------+----------------+ !Left ICA -    !0 cm/s   !--------!--------------!chronic         ! !proximal      !         !        !              !occlusion.      ! +--------------+---------+--------+--------------+----------------+ !Left ICA - mid!0 cm/s   !--------!--------------!chronic         ! !              !         !        !              !occlusion.      ! +--------------+---------+--------+--------------+----------------+ !Left ICA -    !0 cm/s   !--------!--------------!chronic         ! !distal        !         !        !              !occlusion.      ! +--------------+---------+--------+--------------+----------------+ !  Left vertebral!0 cm/s   !--------!--------------!appeared        ! !              !         !        !              !occluded.       ! +--------------+---------+--------+--------------+----------------+ !Left          !200 cm/s !--------!--------------!----------------! !subclavian -  !         !        !              !                ! !proximal      !         !        !              !                 ! +--------------+---------+--------+--------------+----------------+  ------------------------------------------------------------------- Summary:  1. Right internal carotid artery demonstrated 40-59% stenosis by    velocities. Left internal carotid artery demonstrated chronic    occlusive disease, which is know. The left vertebral appeared    occluded on this exam.  Other specific details can be found in the table(s) above. Prepared and Electronically Authenticated by  Leia Alf, MD 2018-05-29T20:27:42   ADDENDUM REPORT: 08/26/2016 08:10  EXAM: OVER-READ INTERPRETATION  CT CHEST  The following report is an over-read performed by radiologist Dr. Rebekah Chesterfield [facility] on 08/26/2016. This over-read does not include interpretation of cardiac or coronary anatomy or pathology. The cardiac CTA interpretation by the cardiologist is attached.  COMPARISON:  None.  FINDINGS: Extracardiac findings will be described separately under dictation for contemporaneously obtained CTA of the chest, abdomen and pelvis.  IMPRESSION: Please see separate dictation for contemporaneously obtained CTA of the chest, abdomen and pelvis 08/24/2016 for full description of extracardiac findings.   Electronically Signed   By: Vinnie Langton M.D.   On: 08/26/2016 08:10   Addended by Etheleen Mayhew, MD on 08/26/2016 8:12 AM    Study Result   CLINICAL DATA:  76 year old male with severe aortic stenosis.  EXAM: Cardiac TAVR CT  TECHNIQUE: The patient was scanned on a Philips 256 scanner. A 120 kV retrospective scan was triggered in the descending thoracic aorta at 111 HU's. Gantry rotation speed was 270 msecs and collimation was .9 mm. 10 mg of iv Metoprolol and no nitro were given. The 3D data set was reconstructed in 5% intervals of the R-R cycle. Systolic and diastolic phases were analyzed on a dedicated work station using MPR, MIP and VRT  modes. The patient received 80 cc of contrast.  FINDINGS: Aortic Valve: Trileaflet, severely thickened with asymmetric calcifications predominantly in the non-coronary leaflet extending into the LVOT.  Aorta:  Normal size, moderate diffuse calcifications, no dissection.  Sinotubular Junction: 29 x 29 mm with almost circumferential calcifications  Ascending Thoracic Aorta:  36 x 35 mm  Aortic Arch:  32 x 29 mm  Descending Thoracic Aorta:  29 x 28 mm  Sinus of Valsalva Measurements:  Non-coronary:  39 mm  Right -coronary:  38 mm  Left -coronary:  37 mm  Coronary Artery Height above Annulus:  Left Main:  10.6 mm  Right Coronary:  16 mm  Virtual Basal Annulus Measurements:  Maximum/Minimum Diameter:  30 x 22 mm  Perimeter:  570 mm2  Area:  87 mm  Optimum Fluoroscopic Angle for Delivery:  LAO 1 CAU 1  No left atrial appendage thrombus (verified with delayed images).  No ASD/VSD.  Two chamber pacemaker with leads seen in the right atrium and right ventricle.  IMPRESSION: 1. Trileaflet, severely thickened with asymmetric calcifications predominantly in the non-coronary leaflet extending into the LVOT. Annular measurements suitable for delivery of a 29 mm Edwards-SAPIEN 3 valve.  2. Sufficient RCA to annulus distance, however borderline LM to annulus distance measuring 10 mm.  3. Optimum Fluoroscopic Angle for Delivery:  LAO 1 CAU 1  4. No left atrial appendage thrombus.  Ena Dawley  Electronically Signed: By: Ena Dawley On: 08/24/2016 12:09       CLINICAL DATA:  76 year old male with history of severe aortic stenosis. Preprocedural study prior to potential transcatheter aortic valve replacement (TAVR) procedure.  EXAM: CT ANGIOGRAPHY CHEST, ABDOMEN AND PELVIS  TECHNIQUE: Multidetector CT imaging through the chest, abdomen and pelvis was performed using the standard protocol during bolus administration  of intravenous contrast. Multiplanar reconstructed images and MIPs were obtained and reviewed to evaluate the vascular anatomy.  CONTRAST:  70 mL of Isovue 370.  COMPARISON:  CT the chest, abdomen and pelvis 10/05/2015.  FINDINGS: CTA CHEST FINDINGS  Cardiovascular: Heart size is enlarged with biatrial dilatation. There is no significant pericardial fluid, thickening or pericardial calcification. There is aortic atherosclerosis, as well as atherosclerosis of the great vessels of the mediastinum and the coronary arteries, including calcified atherosclerotic plaque in the left main, left anterior descending, left circumflex and right coronary arteries. Severe thickening calcification of the aortic valve. Calcification of the mitral annulus. Right-sided pacemaker device in place with lead tip terminating in the right atrial appendage and near the right ventricular apex along the free wall.  Mediastinum/Lymph Nodes: Multiple borderline enlarged and minimally enlarged mediastinal lymph nodes measuring up to 12 mm in short axis in the high right paratracheal nodal station (axial image 53 of series 5). Esophagus is unremarkable in appearance. No axillary lymphadenopathy.  Lungs/Pleura: Small left pleural effusion lying dependently. No right pleural effusion. No suspicious appearing pulmonary nodules or masses. No acute consolidative airspace disease.  Musculoskeletal/Soft Tissues: There are no aggressive appearing lytic or blastic lesions noted in the visualized portions of the skeleton.  CTA ABDOMEN AND PELVIS FINDINGS  Hepatobiliary: No cystic or solid hepatic lesions. No intra or extrahepatic biliary ductal dilatation. Gallbladder is normal in appearance.  Pancreas: No pancreatic mass. No pancreatic ductal dilatation. No pancreatic or peripancreatic fluid or inflammatory changes.  Spleen: Unremarkable.  Adrenals/Urinary Tract: Severe atrophy of the kidneys  bilaterally. Multiple small low-attenuation lesions in both kidneys compatible with simple cysts. Other smaller subcentimeter low-attenuation lesions in both kidneys, too small to characterize, but favored to represent tiny cysts. Bilateral adrenal glands are normal in appearance. No hydroureteronephrosis. Urinary bladder is unremarkable in appearance.  Stomach/Bowel: Normal appearance of the stomach. No pathologic dilatation of small bowel or colon. Normal appendix. Numerous colonic diverticulae are noted, without surrounding inflammatory changes to suggest an acute diverticulitis at this time.  Vascular/Lymphatic: Aortic atherosclerosis, without evidence of aneurysm or dissection in the abdominal or pelvic vasculature. Vascular findings and measurements pertinent to potential TAVR procedure, as detailed below. Circumaortic left renal vein (normal anatomical variant) incidentally noted. Celiac axis, superior mesenteric artery and inferior mesenteric artery are all widely patent without hemodynamically significant stenosis. Single renal arteries are patent bilaterally. No lymphadenopathy noted  in the abdomen or pelvis.  Reproductive: 1.4 cm cyst in the median lobe of the prostate gland incidentally noted. Seminal vesicles are unremarkable in appearance.  Other: No significant volume of ascites.  No pneumoperitoneum.  Musculoskeletal: There are no aggressive appearing lytic or blastic lesions noted in the visualized portions of the skeleton.  VASCULAR MEASUREMENTS PERTINENT TO TAVR:  AORTA:  Minimal Aortic Diameter -  16 x 15 mm  Severity of Aortic Calcification -  severe  RIGHT PELVIS:  Right Common Iliac Artery -  Minimal Diameter - 5.6 x 6.8 mm  Tortuosity - mild  Calcification - severe  Right External Iliac Artery -  Minimal Diameter - 6.1 x 5.6 mm  Tortuosity - moderate  Calcification - moderate  Right Common Femoral Artery -  Minimal  Diameter - 8.2 x 6.9 mm  Tortuosity - mild  Calcification - moderate  LEFT PELVIS:  Left Common Iliac Artery -  Minimal Diameter - 7.2 x 6.6 mm  Tortuosity - mild  Calcification - severe  Left External Iliac Artery -  Minimal Diameter - 7.6 x 7.4 mm  Tortuosity - moderate  Calcification - moderate  Left Common Femoral Artery -  Minimal Diameter - 7.9 x 7.2 mm  Tortuosity - mild  Calcification - moderate  Review of the MIP images confirms the above findings.  IMPRESSION: 1. Vascular findings and measurements pertinent to potential TAVR procedure, as detailed above. This patient does appear to have suitable pelvic arterial access bilaterally. 2. Severe thickening calcification of the aortic valve, compatible with the reported clinical history of severe aortic stenosis. 3. Small left pleural effusion lying dependently. 4. Multiple borderline enlarged and minimally enlarged mediastinal lymph nodes. This is nonspecific and stable compared to remote prior study from 10/05/2015, presumably benign. 5. Cardiomegaly with biatrial dilatation. 6. Colonic diverticulosis without evidence of acute diverticulitis at this time. 7. Severe bilateral renal atrophy. 8. Additional incidental findings, as above.   Electronically Signed   By: Vinnie Langton M.D.   On: 08/26/2016 09:30   RISK SCORES About the STS Risk Calculator Procedure: AV Replacement  Risk of Mortality: 6.846%  Morbidity or Mortality: 37.422%  Long Length of Stay: 23.538%  Short Length of Stay: 10.16%  Permanent Stroke: 3.377%  Prolonged Ventilation: 31.13%  DSW Infection: 1.308%  Renal Failure: N/A  Reoperation: 13.752%   Impression:  This 76 year old gentleman has stage D severe, symptomatic aortic stenosis with NYHA class III symptoms of shortness of breath, fatigue and chest pressure with minimal exertion walking in his house and frequent episodes of dizziness. This is  consistent with chronic diastolic heart failure. I have personally reviewed his 2D echo, TEE, cath and CTA studies. His 2D echo in March showed a trileaflet aortic valve that had severe calcification, thickening and poor mobility of all three leaflets and looked like a severely stenotic valve. The mean gradient was measured at 33 mm Hg. The peak velocity ratio was measured at 1.03 and the indexed valve area 1.6 cm2/m2 which makes no sense given the appearance of the valve. The TEE showed a mean gradient of 25 mm Hg which I think is falsely low and again the appearance of the valve is that of severe AS. The cath showed no significant CAD. The mean gradient was recorded at 14.9 mm Hg which again makes no sense and I think must be a measurement error. His symptoms are consistent with severe AS and the appearance of his valve is consistent with severe AS.  I agree that AVR is indicated for relief of his symptoms and to prevent progressive LV deterioration and to allow HD to occur without worsening hemodynamic instability. His risk for open surgical AVR is at least moderately elevated as not by the STS PROM score but I think it would likely be higher. His cardiac CT shows significant calcification of his ascending aorta and he has severe cerebrovascular disease with  occluded left ICA and vertebral arteries. I think TAVR would be the best treatment for him. His gated cardiac CT shows anatomy suitable for TAVR using a Sapien 3 valve. The LM height is only 10 mm but is probably adequate. The abdominal and pelvic CT shows fairly severe calcified plaque in the aorto-iliac vessels although the lumen size appears large enough and the femoral vessels have minimal plaque. Transfemoral insertion should be possible.   The patient and his wife and daughter were counseled at length regarding treatment alternatives for management of severe symptomatic aortic stenosis. The risks and benefits of surgical intervention has been  discussed in detail. Long-term prognosis with medical therapy was discussed. Alternative approaches such as conventional surgical aortic valve replacement, transcatheter aortic valve replacement, and palliative medical therapy were compared and contrasted at length. This discussion was placed in the context of the patient's own specific clinical presentation and past medical history. All of their questions have been addressed. The patient is eager to proceed with surgical management as soon as possible before his wife has to start chemotherapy.  Assuming we decide to proceed with transcatheter aortic valve replacement, a discussion was held regarding what types of management strategies would be attempted intraoperatively in the event of life-threatening complications, including whether or not the patient would be considered a candidate for the use of cardiopulmonary bypass and/or conversion to open sternotomy for attempted surgical intervention.   The patient has been advised of a variety of complications that might develop including but not limited to risks of death, stroke, paravalvular leak, aortic dissection or other major vascular complications, aortic annulus rupture, device embolization, cardiac rupture or perforation, mitral regurgitation, acute myocardial infarction, arrhythmia, heart block or bradycardia requiring permanent pacemaker placement, congestive heart failure, respiratory failure, renal failure, pneumonia, infection, other late complications related to structural valve deterioration or migration, or other complications that might ultimately cause a temporary or permanent loss of functional independence or other long term morbidity. The patient provides full informed consent for the procedure as described and all questions were answered. He understands that he will need to have a second surgical evaluation and that his candidacy for TAVR will depend on the discussion of the multidisciplinary  structural heart valve team after that evaluation.   Plan:  He will return for a second surgical evaluation by Dr. Roxy Manns. If the team decides to proceed with TAVR it will be scheduled for 08/31/2016. He has been on Coumadin for PAF and with his prior stroke he was placed on a Lovenox bridge prior to his cath. He will stop his Coumadin as of tonight for possible surgery and we will arrange a Lovenox bridge with pharmacy. He would have HD on Monday and then surgery Tuesday.   I spent 60 minutes performing this consultation and > 50% of this time was spent face to face counseling and coordinating the care of this patient's severe aortic stenosis.    Gaye Pollack, MD 08/26/2016

## 2016-08-27 ENCOUNTER — Institutional Professional Consult (permissible substitution) (INDEPENDENT_AMBULATORY_CARE_PROVIDER_SITE_OTHER): Payer: Medicare Other | Admitting: Thoracic Surgery (Cardiothoracic Vascular Surgery)

## 2016-08-27 ENCOUNTER — Other Ambulatory Visit: Payer: Self-pay | Admitting: *Deleted

## 2016-08-27 ENCOUNTER — Other Ambulatory Visit: Payer: Self-pay | Admitting: Pharmacist Clinician (PhC)/ Clinical Pharmacy Specialist

## 2016-08-27 ENCOUNTER — Telehealth: Payer: Self-pay | Admitting: Pharmacist Clinician (PhC)/ Clinical Pharmacy Specialist

## 2016-08-27 ENCOUNTER — Encounter: Payer: Self-pay | Admitting: Thoracic Surgery (Cardiothoracic Vascular Surgery)

## 2016-08-27 VITALS — BP 97/58 | HR 71 | Resp 20 | Ht 71.0 in | Wt 210.0 lb

## 2016-08-27 DIAGNOSIS — I350 Nonrheumatic aortic (valve) stenosis: Secondary | ICD-10-CM | POA: Diagnosis not present

## 2016-08-27 DIAGNOSIS — N186 End stage renal disease: Secondary | ICD-10-CM | POA: Diagnosis not present

## 2016-08-27 DIAGNOSIS — D631 Anemia in chronic kidney disease: Secondary | ICD-10-CM | POA: Diagnosis not present

## 2016-08-27 DIAGNOSIS — N2581 Secondary hyperparathyroidism of renal origin: Secondary | ICD-10-CM | POA: Diagnosis not present

## 2016-08-27 DIAGNOSIS — E1129 Type 2 diabetes mellitus with other diabetic kidney complication: Secondary | ICD-10-CM | POA: Diagnosis not present

## 2016-08-27 MED ORDER — ENOXAPARIN SODIUM 80 MG/0.8ML ~~LOC~~ SOLN: 80.0000 mg | SUBCUTANEOUS | 0 refills | Status: DC

## 2016-08-27 NOTE — Patient Instructions (Addendum)
Stop taking Coumadin (warfarin) and pick up your prescription for lovenox injections:  Friday June 1 - enoxaparin 80 mg at noon, no warfarin Saturday June 2 - enoxaparin 80 mg at noon, no warfarin Sunday June 3 - enoxaparin 80 mg at noon, no warfarin Monday June 4 enoxaparin 80 mg at 9 am, no warfarin Tuesday June 5 - no enoxaparin, no warfarin  Continue taking all other medications without change through the day before surgery.  Have nothing to eat or drink after midnight the night before surgery.  On the morning of surgery take only Prednisone and Metoprolol with a sip of water.  Do not take a lovenox injection on the morning of surgery.

## 2016-08-27 NOTE — Telephone Encounter (Signed)
NOTE:  The below schedule was modified for patient because he is expected to be in the hospital until Friday.  Dosing instructions for Saturday and Sunday have been adjusted down to 5 mg daily, with the caveat that hospital may give him other instructions.  Those instructions will override anything they are given in the office today.  Wife and daughter understand this.

## 2016-08-27 NOTE — Telephone Encounter (Signed)
Weight: 200 lb, Enoxaparin dose 80 mg once daily   Friday June 1 - enoxaparin 80 mg at noon, no warfarin Saturday June 2 - enoxaparin 80 mg at noon, no warfarin Sunday June 3 - enoxaparin 80 mg at noon, no warfarin Monday June 4  enoxaparin 80 mg at 9 am, no warfarin Tuesday June 5 - no enoxaparin, no warfarin Wednesday June 6 -  enoxaparin 80 mg at noon, warfarin 7.5 mg (1.5 tabs)  Thursday June 7 -  enoxaparin 80 mg at noon, warfarin 7.5 mg ( 1.5 tabs) Friday June 8 -  enoxaparin 80 mg at noon, warfarin 7.5 mg (1.5 tabs) Saturday June 9 - enoxaparin 80 mg at noon, warfarin 7.5 mg (1.5 tabs) Sunday June 10 - enoxaparin 80 mg at noon, warfarin 7.5 mg (1.5 tabs) Monday June 11 -  CHECK INR AFTER DIALYSIS   If they keep you in the hospital more than overnight, the hospital will dose the enoxaparin and warfarin until discharge.  Then just follow above schedule from whichever day his his discharged.

## 2016-08-27 NOTE — Progress Notes (Signed)
[facility] CONSULTATION REPORT  Referring Provider is Skeet Latch, MD PCP is Deland Pretty, MD  Chief Complaint  Patient presents with  . Aortic Stenosis    2nd TAVR eval, review all studies    HPI:  Patient is a 76 year old male with history of aortic stenosis, long standing hypertension, end-stage renal disease on hemodialysis, type 2 diabetes mellitus, long-standing persistent atrial fibrillation on warfarin anticoagulation, cerebrovascular disease with complete occlusion of the left carotid artery, previous stroke with residual neurologic deficit, symptomatic bradycardia status post permanent pacemaker placement, and hyperlipidemia who has been referred for a second surgical consultation to discuss treatment options for management of severe symptomatic aortic stenosis. The patient has a long-standing history of complex cardiac disease and originally was a patient of Dr. Rollene Fare but more recently has been followed by [name]. The patient has known of the presence of a heart murmur for many years and echocardiograms have demonstrated the presence of aortic stenosis that has gradually progressed on follow-up echocardiography. He was first evaluated by [name] in 2016 at which time the patient was having problems with hypotension and syncope. Echocardiogram performed at that time revealed normal left ventricular systolic function with moderate aortic stenosis and mild mitral regurgitation. Holter monitor revealed intermittent atrial fibrillation and bradycardia and he underwent permanent pacemaker placement in November 2016.  Attempts to maintain sinus rhythm were unsuccessful and the patient has been anticoagulated ever since on warfarin.  More recently the patient has experienced progression of symptoms of exertional shortness of breath, chest tightness with activity, and orthostatic hypotension with  symptoms of dizziness. He has been having increasing problems with low blood pressure during hemodialysis treatments.  Follow-up echocardiogram performed 06/21/2016 revealed normal left ventricular systolic function with ejection fraction estimated 60-65%. There was at least moderate aortic stenosis with peak velocity across the aortic valve measured 3.8 m/s corresponding to mean transvalvular gradient 33 mmHg.  TEE was performed 07/22/2016 confirming the presence of moderate to severe aortic stenosis. The aortic valve was trileaflet with severely restricted leaflet mobility involving all 3 leaflets. There was spontaneous contrast in the left atrium consistent with low flow. Peak velocity across the aortic valve measured 3.2 m/s corresponding to mean transvalvular gradient estimated 25 mmHg.  There was moderate central mitral regurgitation.  The patient was referred to the multidisciplinary heart valve clinic and evaluated by Dr. Angelena Form performed left and right heart catheterization on 08/20/2016.  Catheterization revealed mild nonobstructive coronary artery disease. Transvalvular gradient measured at catheterization was only 14.9 mmHg.  Pulmonary artery pressures were moderately elevated.  CT angiography was performed and the patient was referred for surgical consultation. He was evaluated by Dr. Cyndia Bent who felt the patient would be relatively poor candidate for conventional surgery. Transcatheter aortic valve replacement has been planned for next week and the patient was referred for a second surgical opinion.  The patient is married and lives locally in [place] with his wife. He is accompanied for his office consultation by his wife and his daughter. He has been disabled for many years secondary to blindness caused by macular degeneration. He has been on hemodialysis for more than 6 years. He dialyzes on a Monday Wednesday and Friday schedule at the [facility]. via left forearm  primary AV fistula. He has remained reasonably active despite his numerous physical limitations including blindness and degenerative arthritis. He admits that over the last 2 or 3 years he  has had to slow down considerably. He is now limited because of exertional shortness of breath, chest tightness, and fatigue. He gets short of breath and tired with very low level activity. He denies resting shortness of breath, PND, orthopnea, or lower extremity edema. He has frequent dizzy spells, particularly when he gets up from a sitting position. He has had syncopal episodes in the past, although none recently.  He has residual dysarthria there is mild and difficulty finding words with speech, all of which dates back to his previous stroke.  He has been chronically immunosuppressed on oral prednisone for many years because of arthritis.  He has been chronically anticoagulated using warfarin for nearly 2 years without any significant bleeding problems or complications.  Past Medical History:  Diagnosis Date  . Anemia   . Aortic stenosis 06/15/12   TEE - EF 03-47%; grade 1 diastolic dysfunction; mild/mod aortic valve stenosis; Mitral valve had calcified annulus, mild pulm htn PA peak pressure 62mmHg  . Barrett's esophagus 05/2003  . Bradycardia 2017   [employer] 2240 Assurity dual-lead pacemaker  . Carpal tunnel syndrome, bilateral 11/03/2015  . Colon polyps   . CVA (cerebral infarction)    2004/affected left side  . Depression   . Diabetes mellitus without complication ([place])   . Diabetic peripheral neuropathy ([place]) 10/02/2015  . Diverticulosis   . End stage renal disease ([place])    hemodialysis 3 times a week  . GERD (gastroesophageal reflux disease)   . Hyperlipidemia   . Hypertension   . Kidney stones   . Macular degeneration    both eyes  . Orthostatic hypotension 09/09/2015  . Paroxysmal atrial fibrillation ([facility])   . Peptic ulcer    bleeding, 1969  . S/P epidural steroid injection    last   injection over 10 years ago  . Seasonal allergies   . Tubular adenoma of colon 07/2001    Past Surgical History:  Procedure Laterality Date  . AV FISTULA PLACEMENT  2009  . BACK SURGERY    . CARDIOVERSION N/A 11/13/2015   Procedure: CARDIOVERSION;  Surgeon: Troy Sine, MD;  Location: [facility] ENDOSCOPY;  Service: Cardiovascular;  Laterality: N/A;  . CARDIOVERSION N/A 01/13/2016   Procedure: CARDIOVERSION;  Surgeon: Will Meredith [name], MD;  Location: [place] [facility];  Service: Cardiovascular;  Laterality: N/A;  . [place]   right eye  . CYSTOSCOPY  several times   kidney stones  . EP IMPLANTABLE DEVICE N/A 03/11/2015   Procedure: Pacemaker Implant;  Surgeon: Will Meredith [name], MD;  [place];  Laterality: Left  . LAMINECTOMY  1969  . RIGHT/LEFT HEART CATH AND CORONARY ANGIOGRAPHY N/A 08/20/2016   Procedure: Right/Left Heart Cath and Coronary Angiography;  Surgeon: Burnell Blanks, MD;  Location: [facility] CV LAB;  Service: Cardiovascular;  Laterality: N/A;  . TEE WITHOUT CARDIOVERSION N/A 07/22/2016   Procedure: TRANSESOPHAGEAL ECHOCARDIOGRAM (TEE);  Surgeon: Skeet Latch, MD;  Location: [facility] ENDOSCOPY;  Service: Cardiovascular;  Laterality: N/A;  . TONSILLECTOMY  1964    Family History  Problem Relation Age of Onset  . Stomach cancer Mother   . Hypertension Father        Died of heart attack  . Heart attack Father   . Stroke Sister   . Heart disease Sister   . Cancer Brother   . Colon cancer Neg Hx     Social History   Social History  . Marital status: Married    Spouse  name: N/A  . Number of children: 3  . Years of education: 14   Occupational History  . 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 Smoking status: Former Smoker    Packs/day: 2.00    Years: 45.00    Types: Cigarettes    Quit date: 01/20/1998  . Smokeless tobacco: Former Systems developer  . Alcohol use No  . Drug use: No  . Sexual activity: Yes     Other Topics Concern  . Not on file   Social History Narrative   Lives at home w/ his wife   Right-handed   Drinks 1 cup of coffee per day    Current Outpatient Prescriptions  Medication Sig Dispense Refill  . atorvastatin (LIPITOR) 40 MG tablet Take 20 mg by mouth daily.    [name] b complex-vitamin c-folic acid (NEPHRO-VITE) 0.8 MG TABS Take 1 tablet by mouth daily.     . bromocriptine (PARLODEL) 5 MG capsule Take 5 mg by mouth at bedtime.     . calcium acetate (PHOSLO) 667 MG capsule Take 667 mg by mouth 4 (four) times daily.     . cetirizine (ZYRTEC) 10 MG tablet Take 10 mg by mouth at bedtime.     . dorzolamide-timolol (COSOPT) 22.3-6.8 MG/ML ophthalmic solution Place 1 drop into the right eye 2 (two) times daily.    [name] FLUoxetine (PROZAC) 20 MG capsule Take 40 mg by mouth daily.     . metoprolol tartrate (LOPRESSOR) 25 MG tablet TAKE 1 TABLET BY MOUTH TWICE DAILY 180 tablet 3  . midodrine (PROAMATINE) 5 MG tablet Take 15 mg by mouth 3 (three) times daily as needed (for systolic blood pressure less than 130).     . midodrine (PROAMATINE) 5 MG tablet TAKE 3 TABLETS BY MOUTH THREE TIMES DAILY AS NEEDED. 270 tablet 1  . omeprazole (PRILOSEC) 20 MG capsule Take 20 mg by mouth daily.    . predniSONE (DELTASONE) 5 MG tablet Take 5 mg by mouth daily.   1  . SENSIPAR 30 MG tablet Take 30 mg by mouth at bedtime.   5  . enoxaparin (LOVENOX) 80 MG/0.8ML injection Inject 0.8 mLs (80 mg total) into the skin daily. (Patient not taking: Reported on 08/27/2016) 10 Syringe 0  . warfarin (COUMADIN) 5 MG tablet Take 1-1.5 tablets by mouth daily as directed by coumadin clinic (Patient not taking: Reported on 08/27/2016) 120 tablet 1   No current facility-administered medications for this visit.     Allergies  Allergen Reactions  . Codeine Nausea Only  . Penicillins Swelling    Has patient had a PCN reaction causing immediate rash, facial/tongue/throat swelling, SOB or lightheadedness with hypotension:  Yes Has patient had a PCN reaction causing severe rash involving mucus membranes or skin necrosis: No Has patient had a PCN reaction that required hospitalization No Has patient had a PCN reaction occurring within the last 10 years: No If all of the above answers are "NO", then may proceed with Cephalosporin use.  . Tramadol Nausea Only      Review of Systems:   General:  normal appetite, decreased energy, no weight gain, no weight loss, no fever  Cardiac:  + chest pain with exertion, no chest pain at rest, + SOB with exertion, no resting SOB, no PND, no orthopnea, + palpitations, + arrhythmia, + atrial fibrillation, no LE edema, + dizzy spells, + syncope  Respiratory:  + shortness of breath, no home oxygen, + productive cough, no dry cough,  no bronchitis, no wheezing, no hemoptysis, no asthma, no pain with inspiration or cough, no sleep apnea, no CPAP at night  GI:   no difficulty swallowing, + reflux, no frequent heartburn, no hiatal hernia, no abdominal pain, no constipation, no diarrhea, no hematochezia, no hematemesis, no melena  GU:   Anuric, no dysuria,  no frequency, no urinary tract infection, no hematuria, no enlarged prostate, no kidney stones, + kidney disease  Vascular:  no pain suggestive of claudication, no pain in feet, + leg cramps, no varicose veins, no DVT, no non-healing foot ulcer  Neuro:   + stroke, + TIA's, no seizures, no headaches, no temporary blindness one eye,  mild slurred speech, + peripheral neuropathy, + chronic pain, + instability of gait, + memory/cognitive dysfunction  Musculoskeletal: + arthritis, + joint swelling, no myalgias, + difficulty walking, limited mobility   Skin:   no rash, no itching, no skin infections, no pressure sores or ulcerations  Psych:   no anxiety, + depression, no nervousness, + unusual recent stress  Eyes:   Legally blind, + blurry vision, no floaters, no recent vision changes, does not wears glasses or contacts  ENT:   no hearing  loss, no loose or painful teeth, no dentures, last saw dentist within 6 months  Hematologic:  + easy bruising, no abnormal bleeding, no clotting disorder, no frequent epistaxis  Endocrine:  + diabetes, does not check CBG's at home           Physical Exam:   BP (!) 97/58   Pulse 71   Resp 20   Ht 5\' 11"  (1.803 m)   Wt 210 lb (95.3 kg)   SpO2 98% Comment: RA  BMI 29.29 kg/m    General:  Elderly, somewhat frail-appearing  HEENT:  Unremarkable   Neck:   no JVD, no bruits, no adenopathy   Chest:   clear to auscultation, symmetrical breath sounds, no wheezes, no rhonchi   CV:   RRR, grade III/VI crescendo/decrescendo murmur heard best at LLSB,  no diastolic murmur  Abdomen:  soft, non-tender, no masses   Extremities:  warm, well-perfused, pulses diminished, no LE edema  Rectal/GU  Deferred  Neuro:   Grossly non-focal and symmetrical throughout  Skin:   Clean and dry, no rashes, no breakdown, thin and frail   Diagnostic Tests:  Transthoracic Echocardiography  Patient: Alexander [name], Alexander [name] MR #: [id number] Study Date: 06/21/2016 Gender: M Age: 13 Height: 179.1 cm Weight: 96.6 kg BSA: 2.22 m^2 Pt. Status: Room:  SONOGRAPHER Oletta Lamas, Will ORDERING Barrett, Evelene Croon REFERRING Barrett, Evelene Croon ATTENDING Loralie Champagne, M.D. PERFORMING [facility], Outpatient  cc:  ------------------------------------------------------------------- LV EF: 60% - 65%  ------------------------------------------------------------------- Indications: (I48.1).  ------------------------------------------------------------------- History: PMH: ESRD. AS. Acquired from the patient and from the patient&'s chart. Atrial fibrillation. Atrial fibrillation. Risk factors: Hypertension. Diabetes mellitus. Dyslipidemia.  ------------------------------------------------------------------- Study Conclusions  - Left ventricle: The cavity  size was normal. Wall thickness was increased in a pattern of mild LVH. Systolic function was normal. The estimated ejection fraction was in the range of 60% to 65%. Indeterminant diastolic function (atrial fibrillation). Wall motion was normal; there were no regional wall motion abnormalities. - Aortic valve: Trileaflet; severely calcified leaflets. There was moderate to severe stenosis. There was trivial regurgitation. Mean gradient (S): 33 mm Hg. Peak gradient (S): 61 mm Hg. Valve area (VTI): 0.81 cm^2. - Aorta: Ascending aortic diameter: 37 mm (S). - Ascending aorta: The ascending aorta was borderline dilated. - Mitral valve: Moderately calcified annulus. There  was mild regurgitation. - Left atrium: The atrium was moderately dilated. - Right ventricle: The cavity size was normal. Systolic function was normal. - Right atrium: The atrium was moderately dilated. - Tricuspid valve: Peak RV-RA gradient (S): 35 mm Hg. - Pulmonary arteries: PA peak pressure: 43 mm Hg (S). - Systemic veins: IVC measured 2.0 cm with < 50% respirophasic variation, suggesting RA pressure 8 mmHg.  Impressions:  - The patient was in atrial fibrillation. Normal LV size with mild LV hypertrophy. EF 60-65%. Normal RV size and systolic function. Moderate biatrial enlargement. The aortic valve was severely calcified with moderate to severe stenosis (moderate by mean gradient, severe by calculated valve area). Would consider TEE to more closely assess the aortic valve.  ------------------------------------------------------------------- Labs, prior tests, procedures, and surgery: Permanent pacemaker system implantation.  ------------------------------------------------------------------- Study data: Comparison was made to the study of 11/28/2014. Study status: Routine. Procedure: The patient reported no pain pre or post test. Transthoracic echocardiography for left  ventricular function evaluation and for assessment of valvular function. Image quality was adequate. Study completion: There were no complications. Transthoracic echocardiography. M-mode, complete 2D, spectral Doppler, and color Doppler. Birthdate: Patient birthdate: 1940-11-02. Age: Patient is 76 yr old. Sex: Gender: male. BMI: 30.1 kg/m^2. Blood pressure: 126/70 Patient status: Outpatient. Study date: Study date: 06/21/2016. Study time: 02:17 PM. Location: [facility]  -------------------------------------------------------------------  ------------------------------------------------------------------- Left ventricle: The cavity size was normal. Wall thickness was increased in a pattern of mild LVH. Systolic function was normal. The estimated ejection fraction was in the range of 60% to 65%. Indeterminant diastolic function (atrial fibrillation). Wall motion was normal; there were no regional wall motion abnormalities.  ------------------------------------------------------------------- Aortic valve: Trileaflet; severely calcified leaflets. Doppler: There was moderate to severe stenosis. There was trivial regurgitation. VTI ratio of LVOT to aortic valve: 0.67. Valve area (VTI): 0.81 cm^2. Indexed valve area (VTI): 1.05 cm^2/m^2. Peak velocity ratio of LVOT to aortic valve: 1.03. Indexed valve area (Vmax): 1.6 cm^2/m^2. Mean velocity ratio of LVOT to aortic valve: 0.67. Mean gradient (S): 33 mm Hg. Peak gradient (S): 61 mm Hg.  ------------------------------------------------------------------- Aorta: Aortic root: The aortic root was normal in size. Ascending aorta: The ascending aorta was borderline dilated.  ------------------------------------------------------------------- Mitral valve: Moderately calcified annulus. Doppler: There was no evidence for stenosis. There was mild regurgitation. Peak gradient (D): 14  mm Hg.  ------------------------------------------------------------------- Left atrium: The atrium was moderately dilated.  ------------------------------------------------------------------- Right ventricle: The cavity size was normal. Systolic function was normal.  ------------------------------------------------------------------- Pulmonic valve: Structurally normal valve. Cusp separation was normal. Doppler: Transvalvular velocity was within the normal range. There was trivial regurgitation.  ------------------------------------------------------------------- Tricuspid valve: Doppler: There was mild regurgitation.  ------------------------------------------------------------------- Right atrium: The atrium was moderately dilated.  ------------------------------------------------------------------- Pericardium: There was no pericardial effusion.  ------------------------------------------------------------------- Systemic veins: IVC measured 2.0 cm with <50% respirophasic variation, suggesting RA pressure 8 mmHg.  ------------------------------------------------------------------- Measurements  Left ventricle Value Reference LV ID, ED, PLAX chordal 45.6 mm 43 - 52 LV ID, ES, PLAX chordal 29.1 mm 23 - 38 LV fx shortening, PLAX chordal 36 % >=29 LV PW thickness, ED 14.1 mm --------- IVS/LV PW ratio, ED 0.99 <=1.3 Stroke volume, 2D 171 ml --------- Stroke volume/bsa, 2D 77 ml/m^2 ---------  Ventricular septum Value Reference IVS thickness, ED 14 mm ---------  LVOT Value Reference LVOT ID, S  21 mm --------- LVOT area 3.46 cm^2 --------- LVOT ID 21 mm --------- LVOT peak velocity, S 391 cm/s --------- LVOT mean velocity, S 163 cm/s ---------  LVOT VTI, S 49.4 cm --------- LVOT peak gradient, S 61 mm Hg --------- Stroke volume (SV), LVOT DP 171.1 ml --------- Stroke index (SV/bsa), LVOT DP 77.2 ml/m^2 ---------  Aortic valve Value Reference Aortic valve peak velocity, S 381 cm/s --------- Aortic valve mean velocity, S 242 cm/s --------- Aortic valve VTI, S 73.7 cm --------- Aortic mean gradient, S 28 mm Hg --------- Aortic peak gradient, S 58 mm Hg --------- VTI ratio, LVOT/AV 0.67 --------- Aortic valve area, VTI 2.32 cm^2 --------- Aortic valve area/bsa, VTI 1.05 cm^2/m^2 --------- Velocity ratio, peak, LVOT/AV 1.03 --------- Aortic valve area/bsa, peak 1.6 cm^2/m^2 --------- velocity Velocity ratio, mean, LVOT/AV 0.67 --------- Aortic regurg pressure half-time 401 ms ---------  Aorta Value Reference Aortic root ID, ED 36 mm --------- Ascending aorta ID, A-P, S 37 mm ---------  Left atrium Value Reference LA ID, A-P, ES 46 mm --------- LA ID/bsa, A-P 2.08 cm/m^2 <=2.2 LA volume, S  115 ml --------- LA volume/bsa, S 51.9 ml/m^2 --------- LA volume, ES, 1-p A4C 100 ml --------- LA volume/bsa, ES, 1-p A4C 45.1 ml/m^2 --------- LA volume, ES, 1-p A2C 116 ml --------- LA volume/bsa, ES, 1-p A2C 52.3 ml/m^2 ---------  Mitral valve Value Reference Mitral E-wave peak velocity 190 cm/s --------- Mitral deceleration time (H) 282 ms 150 - 230 Mitral peak gradient, D 14 mm Hg ---------  Pulmonary arteries Value Reference PA pressure, S, DP (H) 43 mm Hg <=30  Tricuspid valve Value Reference Tricuspid regurg peak velocity 296 cm/s --------- Tricuspid peak RV-RA gradient 35 mm Hg ---------  Systemic veins Value Reference Estimated CVP 8 mm Hg ---------  Right ventricle Value Reference RV pressure, S, DP (H) 43 mm Hg <=30  Legend: (L) and (H) mark values outside specified reference range.  ------------------------------------------------------------------- Prepared and Electronically Authenticated by  Loralie Champagne, M.D. [id number]:44:38   Transesophageal Echocardiography  Patient:    Emeterio, Balke MR #:       [id number] Study Date: 07/22/2016 Gender:     M Age:        75 Height:     180.3 cm Weight:     95.3 kg BSA:        2.21 m^2 Pt. Status: Room:   ADMITTING   Skeet Latch, MD  [place], MD  [place], MD  PERFORMING  Skeet Latch, MD  [place],  MD  cc:  ------------------------------------------------------------------- LV EF: 55% -   60%  ------------------------------------------------------------------- Indications:      Atrial fibrillation - 427.31.  ------------------------------------------------------------------- Study Conclusions  - Left ventricle: Systolic function was normal. The estimated   ejection fraction was in the range of 55% to 60%. Wall motion was   normal; there were no regional wall motion abnormalities. - Aortic valve: Severe calcification. Cusp separation was reduced.   Valve mobility was restricted. There was moderate to severe   stenosis. Peak velocity (S): 318 cm/s. Mean gradient (S): 25 mm   Hg. - Mitral valve: Moderately calcified annulus. Mobility of the   posterior leaflet was restricted. Mild prolapse, involving the   middle segment of the anterior leaflet. There was moderate   regurgitation. - Left atrium: No evidence of thrombus in the atrial cavity or   appendage. No evidence of thrombus in the atrial cavity or   appendage. There was spontaneous echo contrast (&quot;smoke&quot;). - Right ventricle: Systolic function was normal. - Right atrium: No evidence of thrombus in the atrial cavity or  appendage. - Atrial septum: No defect or patent foramen ovale was identified   by color flow Doppler. - Tricuspid valve: There was mild regurgitation.  Impressions:  - Aortic stenosis calculates as moderate but appears severe   visually. A true aortic valve gradient was not obtained.  ------------------------------------------------------------------- Study data:   Procedure:  Initial setup. The patient was brought to the laboratory. Surface ECG leads were monitored. Sedation. Conscious sedation was administered. Transesophageal echocardiography. An adult multiplane transesophageal probe was inserted by the attending cardiologist. Image quality was adequate.  Study completion:  The  patient tolerated the procedure well. There were no complications.          Diagnostic transesophageal echocardiography.  2D and color Doppler.  Birthdate:  Patient birthdate: [birth year].  Age:  Patient is 76 yr old.  Sex:  Gender: male.    BMI: 29.3 kg/m^2.  Study date:  Study date: 07/22/2016. Study time: 10:04 AM.  -------------------------------------------------------------------  ------------------------------------------------------------------- Left ventricle:  Systolic function was normal. The estimated ejection fraction was in the range of 55% to 60%. Wall motion was normal; there were no regional wall motion abnormalities.  ------------------------------------------------------------------- Aortic valve:   Trileaflet; severely thickened leaflets.  Severe calcification. Cusp separation was reduced. Valve mobility was restricted.  Doppler:   There was moderate to severe stenosis. There was no significant regurgitation.    Mean gradient (S): 25 mm Hg. Peak gradient (S): 40 mm Hg.  ------------------------------------------------------------------- Aorta:  There was atheromatous plaque in the descending aorta. There was no evidence for dissection. Aortic root: The aortic root was not dilated. Ascending aorta: The ascending aorta was normal in size. Aortic arch: The aortic arch was normal in size. Descending aorta: The descending aorta was normal in size.  ------------------------------------------------------------------- Mitral valve:   Moderately calcified annulus. Leaflet separation was normal. Mobility of the posterior leaflet was restricted.  Mild prolapse, involving the middle segment of the anterior leaflet. Doppler:  There was moderate regurgitation.  ------------------------------------------------------------------- Left atrium:  The atrium was normal in size.  No evidence of thrombus in the atrial cavity or appendage.  No evidence of thrombus in the  atrial cavity or appendage. There was spontaneous echo contrast (&quot;smoke&quot;). The appendage was morphologically a left appendage, multilobulated, and of normal size. Emptying velocity was normal.  ------------------------------------------------------------------- Atrial septum:  No defect or patent foramen ovale was identified by color flow Doppler.  ------------------------------------------------------------------- Right ventricle:  The cavity size was normal. Wall thickness was normal. Systolic function was normal.  ------------------------------------------------------------------- Pulmonic valve:    Structurally normal valve.  ------------------------------------------------------------------- Tricuspid valve:   Structurally normal valve.   Leaflet separation was normal.  Doppler:  There was mild regurgitation.  ------------------------------------------------------------------- Pulmonary artery:   The main pulmonary artery was normal-sized.  ------------------------------------------------------------------- Right atrium:  The atrium was normal in size.  No evidence of thrombus in the atrial cavity or appendage. The appendage was morphologically a right appendage.  ------------------------------------------------------------------- Pericardium:  There was no pericardial effusion.  ------------------------------------------------------------------- Measurements   Left ventricle                       Value  LV ejection time                     330   ms    LVOT  Value  LVOT ID, S                           20    mm  LVOT area                            3.14  cm^2    Aortic valve                         Value  Aortic valve peak velocity, S        318   cm/s  Aortic valve mean velocity, S        235   cm/s  Aortic valve VTI, S                  74.4  cm  Aortic mean gradient, S              25    mm Hg  Aortic peak gradient,  S              40    mm Hg    Left atrium                          Value  LA appendage peak velocity           28.41 cm/s    Tricuspid valve                      Value  Tricuspid regurg peak velocity       287   cm/s  Tricuspid peak RV-RA gradient        33    mm Hg  Legend: (L)  and  (H)  mark values outside specified reference range.  ------------------------------------------------------------------- Prepared and Electronically Authenticated by  Skeet Latch, MD 2018-05-04T17:11:42    Right/Left Heart Cath and Coronary Angiography  Conclusion     Prox Cx to Mid Cx lesion, 20 %stenosed.  Ost 3rd Mrg to 3rd Mrg lesion, 20 %stenosed.  Prox LAD to Mid LAD lesion, 20 %stenosed.  Prox RCA to Dist RCA lesion, 10 %stenosed.   1. Mild non-obstructive CAD 2. Aortic stenosis (the mean gradient by cath is 14.9 mmHg). The mean gradient by echo is 40 mmHg and the valve is heavily calcified with poor leaflet excursion.   Recommendation: Continue planning for TAVR. He is scheduled to see our CT surgeons.    Indications   Severe aortic stenosis [I35.0 (ICD-10-CM)]  Procedural Details/Technique   Technical Details Indication: 76 yo male with severe AS, ESRD on HD, normal LV function. Workup for TAVR  Procedure: The risks, benefits, complications, treatment options, and expected outcomes were discussed with the patient. The patient and/or family concurred with the proposed plan, giving informed consent. The patient was further sedated with Versed and Fentanyl. The right groin was prepped and draped in the usual manner. Using the modified Seldinger access technique, a 5 French sheath was placed in the right femoral artery. A 7 French sheath was placed in the right femoral vein. Right heart cath performed with a balloon tipped catheter. Standard diagnostic catheters were used to perform selective coronary angiography. I crossed his aortic valve with an AL-2 catheter and straight  wire. No left ventricular angiogram was performed.  There were no immediate complications. The patient was taken  to the recovery area in stable condition. \   Estimated blood loss <50 mL.  During this procedure the patient was administered the following to achieve and maintain moderate conscious sedation: Versed 0.5 mg, Fentanyl mcg, while the patient's heart rate, blood pressure, and oxygen saturation were continuously monitored. The period of conscious sedation was 32 minutes, of which I was present face-to-face 100% of this time.    Complications   Complications documented before study signed (08/20/2016 3:52 PM EDT)    RIGHT/LEFT HEART CATH AND CORONARY ANGIOGRAPHY   None Documented by Burnell Blanks, MD 08/20/2016 3:51 PM EDT  Time Range: Intra-procedure      Coronary Findings   Dominance: Right  Left Anterior Descending  Vessel is large.  Prox LAD to Mid LAD lesion, 20% stenosed.  First Diagonal Branch  Vessel is moderate in size.  First Septal Branch  Vessel is small in size.  Second Diagonal Branch  Vessel is small in size.  Second Septal Branch  Vessel is small in size.  Third Diagonal Branch  Vessel is small in size.  Third Septal Branch  Vessel is small in size.  Left Circumflex  Vessel is large.  Prox Cx to Mid Cx lesion, 20% stenosed.  First Obtuse Marginal Branch  Vessel is small in size.  Second Obtuse Marginal Branch  Vessel is small in size.  Third Obtuse Marginal Branch  Vessel is large in size.  Ost 3rd Mrg to 3rd Mrg lesion, 20% stenosed.  Right Coronary Artery  Prox RCA to Dist RCA lesion, 10% stenosed.  Coronary Diagrams   Diagnostic Diagram       Implants     No implant documentation for this case.  PACS Images   Show images for Cardiac catheterization   Link to Procedure Log   Procedure Log    Hemo Data    Most Recent Value  Fick Cardiac Output 6.14 L/min  Fick Cardiac Output Index 2.91 (L/min)/BSA  Aortic Mean  Gradient 14.9 mmHg  Aortic Peak Gradient 7 mmHg  Aortic Valve Area 2.63  Aortic Value Area Index 1.25 cm2/BSA  RA A Wave 15 mmHg  RA V Wave 16 mmHg  RA Mean 14 mmHg  RV Systolic Pressure 39 mmHg  RV Diastolic Pressure 9 mmHg  RV EDP 12 mmHg  PA Systolic Pressure 49 mmHg  PA Diastolic Pressure 23 mmHg  PA Mean 33 mmHg  PW A Wave 25 mmHg  PW V Wave 20 mmHg  PW Mean 23 mmHg  AO Systolic Pressure 703 mmHg  AO Diastolic Pressure 70 mmHg  AO Mean 93 mmHg  LV Systolic Pressure 500 mmHg  LV Diastolic Pressure 12 mmHg  LV EDP 14 mmHg  Arterial Occlusion Pressure Extended Systolic Pressure 938 mmHg  Arterial Occlusion Pressure Extended Diastolic Pressure 68 mmHg  Arterial Occlusion Pressure Extended Mean Pressure 90 mmHg  Left Ventricular Apex Extended Systolic Pressure 182 mmHg  Left Ventricular Apex Extended Diastolic Pressure 7 mmHg  Left Ventricular Apex Extended EDP Pressure 20 mmHg  QP/QS 1  TPVR Index 11.34 HRUI  TSVR Index 31.95 HRUI  PVR SVR Ratio 0.13  TPVR/TSVR Ratio 0.35     Cardiac TAVR CT  TECHNIQUE: The patient was scanned on a Philips 256 scanner. A 120 kV retrospective scan was triggered in the descending thoracic aorta at 111 HU's. Gantry rotation speed was 270 msecs and collimation was .9 mm. 10 mg of iv Metoprolol and no nitro were given. The 3D data set was reconstructed  in 5% intervals of the R-R cycle. Systolic and diastolic phases were analyzed on a dedicated work station using MPR, MIP and VRT modes. The patient received 80 cc of contrast.  FINDINGS: Aortic Valve: Trileaflet, severely thickened with asymmetric calcifications predominantly in the non-coronary leaflet extending into the LVOT.  Aorta:  Normal size, moderate diffuse calcifications, no dissection.  Sinotubular Junction: 29 x 29 mm with almost circumferential calcifications  Ascending Thoracic Aorta:  36 x 35 mm  Aortic Arch:  32 x 29 mm  Descending Thoracic Aorta:  29 x 28  mm  Sinus of Valsalva Measurements:  Non-coronary:  39 mm  Right -coronary:  38 mm  Left -coronary:  37 mm  Coronary Artery Height above Annulus:  Left Main:  10.6 mm  Right Coronary:  16 mm  Virtual Basal Annulus Measurements:  Maximum/Minimum Diameter:  30 x 22 mm  Perimeter:  570 mm2  Area:  87 mm  Optimum Fluoroscopic Angle for Delivery:  LAO 1 CAU 1  No left atrial appendage thrombus (verified with delayed images).  No ASD/VSD.  Two chamber pacemaker with leads seen in the right atrium and right ventricle.  IMPRESSION: 1. Trileaflet, severely thickened with asymmetric calcifications predominantly in the non-coronary leaflet extending into the LVOT. Annular measurements suitable for delivery of a 29 mm Edwards-SAPIEN 3 valve.  2. Sufficient RCA to annulus distance, however borderline LM to annulus distance measuring 10 mm.  3. Optimum Fluoroscopic Angle for Delivery:  LAO 1 CAU 1  4. No left atrial appendage thrombus.  Ena Dawley  Electronically Signed: By: Ena Dawley On: 08/24/2016 12:09   CT ANGIOGRAPHY CHEST, ABDOMEN AND PELVIS  TECHNIQUE: Multidetector CT imaging through the chest, abdomen and pelvis was performed using the standard protocol during bolus administration of intravenous contrast. Multiplanar reconstructed images and MIPs were obtained and reviewed to evaluate the vascular anatomy.  CONTRAST:  70 mL of Isovue 370.  COMPARISON:  CT the chest, abdomen and pelvis 10/05/2015.  FINDINGS: CTA CHEST FINDINGS  Cardiovascular: Heart size is enlarged with biatrial dilatation. There is no significant pericardial fluid, thickening or pericardial calcification. There is aortic atherosclerosis, as well as atherosclerosis of the great vessels of the mediastinum and the coronary arteries, including calcified atherosclerotic plaque in the left main, left anterior descending, left circumflex and  right coronary arteries. Severe thickening calcification of the aortic valve. Calcification of the mitral annulus. Right-sided pacemaker device in place with lead tip terminating in the right atrial appendage and near the right ventricular apex along the free wall.  Mediastinum/Lymph Nodes: Multiple borderline enlarged and minimally enlarged mediastinal lymph nodes measuring up to 12 mm in short axis in the high right paratracheal nodal station (axial image 53 of series 5). Esophagus is unremarkable in appearance. No axillary lymphadenopathy.  Lungs/Pleura: Small left pleural effusion lying dependently. No right pleural effusion. No suspicious appearing pulmonary nodules or masses. No acute consolidative airspace disease.  Musculoskeletal/Soft Tissues: There are no aggressive appearing lytic or blastic lesions noted in the visualized portions of the skeleton.  CTA ABDOMEN AND PELVIS FINDINGS  Hepatobiliary: No cystic or solid hepatic lesions. No intra or extrahepatic biliary ductal dilatation. Gallbladder is normal in appearance.  Pancreas: No pancreatic mass. No pancreatic ductal dilatation. No pancreatic or peripancreatic fluid or inflammatory changes.  Spleen: Unremarkable.  Adrenals/Urinary Tract: Severe atrophy of the kidneys bilaterally. Multiple small low-attenuation lesions in both kidneys compatible with simple cysts. Other smaller subcentimeter low-attenuation lesions in both kidneys, too small to characterize,  but favored to represent tiny cysts. Bilateral adrenal glands are normal in appearance. No hydroureteronephrosis. Urinary bladder is unremarkable in appearance.  Stomach/Bowel: Normal appearance of the stomach. No pathologic dilatation of small bowel or colon. Normal appendix. Numerous colonic diverticulae are noted, without surrounding inflammatory changes to suggest an acute diverticulitis at this time.  Vascular/Lymphatic: Aortic  atherosclerosis, without evidence of aneurysm or dissection in the abdominal or pelvic vasculature. Vascular findings and measurements pertinent to potential TAVR procedure, as detailed below. Circumaortic left renal vein (normal anatomical variant) incidentally noted. Celiac axis, superior mesenteric artery and inferior mesenteric artery are all widely patent without hemodynamically significant stenosis. Single renal arteries are patent bilaterally. No lymphadenopathy noted in the abdomen or pelvis.  Reproductive: 1.4 cm cyst in the median lobe of the prostate gland incidentally noted. Seminal vesicles are unremarkable in appearance.  Other: No significant volume of ascites.  No pneumoperitoneum.  Musculoskeletal: There are no aggressive appearing lytic or blastic lesions noted in the visualized portions of the skeleton.  VASCULAR MEASUREMENTS PERTINENT TO TAVR:  AORTA:  Minimal Aortic Diameter -  16 x 15 mm  Severity of Aortic Calcification -  severe  RIGHT PELVIS:  Right Common Iliac Artery -  Minimal Diameter - 5.6 x 6.8 mm  Tortuosity - mild  Calcification - severe  Right External Iliac Artery -  Minimal Diameter - 6.1 x 5.6 mm  Tortuosity - moderate  Calcification - moderate  Right Common Femoral Artery -  Minimal Diameter - 8.2 x 6.9 mm  Tortuosity - mild  Calcification - moderate  LEFT PELVIS:  Left Common Iliac Artery -  Minimal Diameter - 7.2 x 6.6 mm  Tortuosity - mild  Calcification - severe  Left External Iliac Artery -  Minimal Diameter - 7.6 x 7.4 mm  Tortuosity - moderate  Calcification - moderate  Left Common Femoral Artery -  Minimal Diameter - 7.9 x 7.2 mm  Tortuosity - mild  Calcification - moderate  Review of the MIP images confirms the above findings.  IMPRESSION: 1. Vascular findings and measurements pertinent to potential TAVR procedure, as detailed above. This patient does  appear to have suitable pelvic arterial access bilaterally. 2. Severe thickening calcification of the aortic valve, compatible with the reported clinical history of severe aortic stenosis. 3. Small left pleural effusion lying dependently. 4. Multiple borderline enlarged and minimally enlarged mediastinal lymph nodes. This is nonspecific and stable compared to remote prior study from 10/05/2015, presumably benign. 5. Cardiomegaly with biatrial dilatation. 6. Colonic diverticulosis without evidence of acute diverticulitis at this time. 7. Severe bilateral renal atrophy. 8. Additional incidental findings, as above.   Electronically Signed   By: Vinnie Langton M.D.   On: 08/26/2016 09:30    STS Risk Calculator  Procedure    AVR  Risk of Mortality   9.8% Morbidity or Mortality  46.0% Prolonged LOS   32.0% Short LOS    9.4% Permanent Stroke   5.1% Prolonged Vent Support  30.2% DSW Infection    1.3% Renal Failure    n/a% Reoperation    16.1%    Impression:  Patient has stage D severe symptomatic aortic stenosis. He presents with slow gradual progression of symptoms of exertional shortness of breath and exertional chest tightness, and fatigue that now occur with minimal activity, consistent with chronic diastolic congestive heart failure, New [place] Heart Association functional class III.  Symptoms are unquestionably multifactorial and influenced by the presence of left ventricular diastolic dysfunction and dialysis dependent end-stage  renal disease.  Serial echocardiograms have demonstrated the presence of aortic stenosis that has gradually progressed over several years. I have personally reviewed the patient's recent transthoracic and transesophageal echocardiograms, diagnostic cardiac catheterization, and CT angiogram. I agree the patient likely has severe low gradient normal ejection fraction aortic stenosis. Transthoracic and transesophageal echocardiograms demonstrated the  presence of an aortic valve that appears to be severely diseased with severe thickening, calcification, and restricted leaflet mobility involving all 3 leaflets of the valve. Peak velocity across the aortic valve and estimated transvalvular gradients have varied dramatically between diagnostic tests and are clearly affected by the patient's underlying atrial fibrillation and significant diastolic dysfunction. Peak velocity across the aortic valve on transthoracic echocardiogram has ranged anywhere between 3.0 and 3.9 m/s corresponding to mean transvalvular gradient approaching 40 mmHg. Transvalvular gradient measured at catheterization was notably somewhat lower, which may or may not be accurate. Dobutamine stress echocardiography could be performed to confirm the presence of severe aortic stenosis, but I agree with previous assessments that the aortic stenosis appears to be functionally severe and I feel that the patient would benefit from aortic valve replacement. However, risks associated with conventional surgery would be extremely high in this elderly patient with numerous comorbid medical problems. In addition, CT angiography reveals the presence of essentially complete napkin ring calcification involving the aortic root at the level of the sinotubular junction. Under the circumstances risks associated with conventional surgery would be exceptionally high, and I would not consider this patient a candidate for conventional surgery. Cardiac gated CT angiogram of the heart reveals anatomical characteristics consistent with severe aortic stenosis and suitable for transcatheter aortic valve replacement without any significant complicating features. The distance between the aortic annulus and left main coronary artery is a little bit shorter than usual, but the size of the aortic root and the sinuses of Valsalva are relatively large, thereby mitigating the potential risk for coronary occlusion. CT angiogram of the  aorta and iliac vessels demonstrates diffuse atherosclerotic disease but what appears to be adequate pelvic vascular access for a transfemoral approach.    Plan:  The patient and his family were counseled at length regarding treatment alternatives for management of severe symptomatic aortic stenosis. Alternative approaches such as conventional aortic valve replacement, transcatheter aortic valve replacement, and palliative medical therapy were compared and contrasted at length.  The risks associated with conventional surgical aortic valve replacement were been discussed in detail, as were expectations for post-operative convalescence, and why I would be reluctant to consider this patient a candidate for conventional surgery.  Issues specific to transcatheter aortic valve replacement were discussed including questions about long term valve durability, the potential for paravalvular leak, possible increased risk of need for permanent pacemaker placement, and other technical complications related to the procedure itself.  Long-term prognosis with medical therapy was discussed. This discussion was placed in the context of the patient's own specific clinical presentation and past medical history.  All of their questions been addressed.  The patient hopes to proceed with transcatheter aortic valve replacement next week as previously scheduled.  Following the decision to proceed with transcatheter aortic valve replacement, a discussion has been held regarding what types of management strategies would be attempted intraoperatively in the event of life-threatening complications, including whether or not the patient would be considered a candidate for the use of cardiopulmonary bypass and/or conversion to open sternotomy for attempted surgical intervention.  The patient has been advised of a variety of complications that might develop including  but not limited to risks of death, stroke, paravalvular leak, aortic  dissection or other major vascular complications, aortic annulus rupture, device embolization, cardiac rupture or perforation, mitral regurgitation, acute myocardial infarction, arrhythmia, heart block or bradycardia requiring permanent pacemaker placement, congestive heart failure, respiratory failure, renal failure, pneumonia, infection, other late complications related to structural valve deterioration or migration, or other complications that might ultimately cause a temporary or permanent loss of functional independence or other long term morbidity.  The patient provides full informed consent for the procedure as described and all questions were answered.   I spent in excess of 90 minutes during the conduct of this office consultation and >50% of this time involved direct face-to-face encounter with the patient for counseling and/or coordination of their care.    Valentina Gu. Roxy Manns, MD 08/27/2016 12:19 PM

## 2016-08-30 ENCOUNTER — Telehealth: Payer: Self-pay | Admitting: Cardiovascular Disease

## 2016-08-30 ENCOUNTER — Encounter (HOSPITAL_COMMUNITY): Payer: Self-pay | Admitting: *Deleted

## 2016-08-30 DIAGNOSIS — N186 End stage renal disease: Secondary | ICD-10-CM | POA: Diagnosis not present

## 2016-08-30 DIAGNOSIS — E1129 Type 2 diabetes mellitus with other diabetic kidney complication: Secondary | ICD-10-CM | POA: Diagnosis not present

## 2016-08-30 DIAGNOSIS — D631 Anemia in chronic kidney disease: Secondary | ICD-10-CM | POA: Diagnosis not present

## 2016-08-30 DIAGNOSIS — N2581 Secondary hyperparathyroidism of renal origin: Secondary | ICD-10-CM | POA: Diagnosis not present

## 2016-08-30 MED ORDER — SODIUM CHLORIDE 0.9 % IV SOLN
INTRAVENOUS | Status: DC
  Administered 2016-08-31 (×2): via INTRAVENOUS

## 2016-08-30 MED ORDER — INSULIN REGULAR HUMAN 100 UNIT/ML IJ SOLN
INTRAMUSCULAR | Status: DC
  Filled 2016-08-30: qty 1

## 2016-08-30 MED ORDER — VANCOMYCIN HCL 10 G IV SOLR
1500.0000 mg | INTRAVENOUS | Status: AC
  Administered 2016-08-31: 1500 mg via INTRAVENOUS
  Filled 2016-08-30: qty 1500

## 2016-08-30 MED ORDER — NITROGLYCERIN IN D5W 200-5 MCG/ML-% IV SOLN
2.0000 ug/min | INTRAVENOUS | Status: DC
  Filled 2016-08-30: qty 250

## 2016-08-30 MED ORDER — PHENYLEPHRINE HCL 10 MG/ML IJ SOLN
30.0000 ug/min | INTRAMUSCULAR | Status: DC
  Filled 2016-08-30: qty 2

## 2016-08-30 MED ORDER — NOREPINEPHRINE BITARTRATE 1 MG/ML IV SOLN
0.0000 ug/min | INTRAVENOUS | Status: DC
  Filled 2016-08-30: qty 4

## 2016-08-30 MED ORDER — DOPAMINE-DEXTROSE 3.2-5 MG/ML-% IV SOLN
0.0000 ug/kg/min | INTRAVENOUS | Status: DC
  Filled 2016-08-30: qty 250

## 2016-08-30 MED ORDER — SODIUM CHLORIDE 0.9 % IV SOLN
INTRAVENOUS | Status: DC
Start: 2016-08-31 — End: 2016-08-31
  Filled 2016-08-30: qty 30

## 2016-08-30 MED ORDER — EPINEPHRINE PF 1 MG/ML IJ SOLN
0.0000 ug/min | INTRAVENOUS | Status: DC
  Filled 2016-08-30: qty 4

## 2016-08-30 MED ORDER — CHLORHEXIDINE GLUCONATE 0.12 % MT SOLN
15.0000 mL | Freq: Once | OROMUCOSAL | Status: AC
  Administered 2016-08-31: 15 mL via OROMUCOSAL
  Filled 2016-08-30: qty 15

## 2016-08-30 MED ORDER — POTASSIUM CHLORIDE 2 MEQ/ML IV SOLN
80.0000 meq | INTRAVENOUS | Status: DC
  Filled 2016-08-30: qty 40

## 2016-08-30 MED ORDER — DEXMEDETOMIDINE HCL IN NACL 400 MCG/100ML IV SOLN
0.1000 ug/kg/h | INTRAVENOUS | Status: AC
  Administered 2016-08-31: .3 ug/kg/h via INTRAVENOUS
  Filled 2016-08-30: qty 100

## 2016-08-30 MED ORDER — LEVOFLOXACIN IN D5W 500 MG/100ML IV SOLN
500.0000 mg | INTRAVENOUS | Status: AC
  Administered 2016-08-31: 500 mg via INTRAVENOUS
  Filled 2016-08-30: qty 100

## 2016-08-30 MED ORDER — MAGNESIUM SULFATE 50 % IJ SOLN
40.0000 meq | INTRAMUSCULAR | Status: DC
  Filled 2016-08-30: qty 10

## 2016-08-30 NOTE — Telephone Encounter (Signed)
LM for daughter that [name] is out of the office today and to inquire if we can be of any assistance/clarify what is needed (unable to determine from note). Will route to MD

## 2016-08-30 NOTE — Telephone Encounter (Signed)
New message   Pt daughter verbalized that he has an appt in Aug 26,2018 and   she feels that she should not go to see another heart physicians.  Pt daughter wants a call from [place]

## 2016-08-30 NOTE — Progress Notes (Signed)
Pt stated that he gets SOB with exertion but denies chest pain. Pt made aware to stop taking vitamins, fish oil and herbal medications. Do not take any NSAIDs ie: Ibuprofen, Advil, Naproxen, BC and Goody Powder. Pt does not take any diabetes medications and does not check BG. Spoke with Thurmond Butts, RN, to clarify MD's instructions for pt to take Metoprolol on DOS. Thurmond Butts stated that it is okay for pt to take Prilosec and Prozac and Prednisone but pt should not take Metoprolol as advised. Pt stated that last dose of Lovenox was today at 1:00P.M. Pt verbalized understanding of all pre-op instructions. Anesthesia asked to review pt history ( see note).

## 2016-08-30 NOTE — H&P (Signed)
[address] [address] [address]       [place], [address]             [phone]      Cardiothoracic Surgery Admission History and Physical   Referring Provider is Skeet Latch, MD PCP is Deland Pretty, MD      Chief Complaint  Patient presents with  . Aortic Stenosis        HPI:  The patient is a 76 year old gentleman with diabetes, hypertension, hyperlipidemia, ESRD on HD, PAF on Coumadin, prior stroke in 2004 with some left sided weakness and aphasia, bradycardia s/p PPM and aortic stenosis. Over the past year he has noted progressive exertional shortness of breath and fatigue. Recently this has been occurring with walking around his house. He has had occasional chest pressure with exertion and frequent dizziness with some episodes of syncope and falls. He has had some problems with hypotension on dialysis and takes Midodrine on his dialysis days. An echo on 06/21/2016 showed a normal LVEF of 60-65%. The aortic valve is trileaflet with severe leaflet calcification with a mean gradient of 33 mm Hg and a peak of 61 mm Hg. A nuclear stress test on 07/08/2016 showed an LVEF of 55-65% with no ischemia. He underwent a TEE by [name] on 4/26 which showed a mean AV gradient of 25 mm Hg and a peak of 40 mm Hg. LVEF was normal. He was referred to Dr. Angelena Form for consideration of TAVR and cath was done on 5/25 showing only mild non-obstructive CAD. The mean AV gradient was only measured at 14.9 mm Hg.  The patient says that he is mainly limited by his shortness of breath and fatigue with exertion. He is legally blind due to macular degeneration which has reduced his independent mobility but he still tries to be active. He lives at home with his wife who is about to start chemotherapy for cancer. He has three daughters and one is with him today along with his wife.       Past Medical History:  Diagnosis Date  . Anemia   . Aortic stenosis 06/15/12   TEE - EF 55-60%; grade  1 diastolic dysfunction; mild/mod aortic valve stenosis; Mitral valve had calcified annulus, mild pulm htn PA peak pressure 38mmHg  . Barrett's esophagus 05/2003  . Bradycardia 2017   [employer] 2240 Assurity dual-lead pacemaker  . Carpal tunnel syndrome, bilateral 11/03/2015  . Colon polyps   . CVA (cerebral infarction)    2004/affected left side  . Depression   . Diabetes mellitus without complication ([facility])   . Diabetic peripheral neuropathy ([facility]) 10/02/2015  . Diverticulosis   . End stage renal disease ([facility])    hemodialysis 3 times a week  . GERD (gastroesophageal reflux disease)   . Hyperlipidemia   . Hypertension   . Kidney stones   . Macular degeneration    both eyes  . Orthostatic hypotension 09/09/2015  . Paroxysmal atrial fibrillation ([facility])   . Peptic ulcer    bleeding, 1969  . S/P epidural steroid injection    last  injection over 10 years ago  . Seasonal allergies   . Tubular adenoma of colon 07/2001         Past Surgical History:  Procedure Laterality Date  . AV FISTULA PLACEMENT  2009  . BACK SURGERY    . CARDIOVERSION N/A 11/13/2015   Procedure: CARDIOVERSION;  Surgeon: Troy Sine, MD;  Location: [facility];  Service: Cardiovascular;  Laterality: N/A;  . CARDIOVERSION N/A 01/13/2016   Procedure: CARDIOVERSION;  Surgeon: Will Meredith [name], MD;  Location: [facility];  Service: Cardiovascular;  Laterality: N/A;  . [place]   right eye  . CYSTOSCOPY  several times   kidney stones  . EP IMPLANTABLE DEVICE N/A 03/11/2015   Procedure: Pacemaker Implant;  Surgeon: Will Meredith [name], MD;  [place];  Laterality: Left  . LAMINECTOMY  1969  . RIGHT/LEFT HEART CATH AND CORONARY ANGIOGRAPHY N/A 08/20/2016   Procedure: Right/Left Heart Cath and Coronary Angiography;  Surgeon: Burnell Blanks, MD;  Location: [facility];  Service: Cardiovascular;  Laterality: N/A;  . TEE  WITHOUT CARDIOVERSION N/A 07/22/2016   Procedure: TRANSESOPHAGEAL ECHOCARDIOGRAM (TEE);  Surgeon: Skeet Latch, MD;  Location: [facility] ENDOSCOPY;  Service: Cardiovascular;  Laterality: N/A;  . TONSILLECTOMY  1964         Family History  Problem Relation Age of Onset  . Stomach cancer Mother   . Hypertension Father        Died of heart attack  . Heart attack Father   . Stroke Sister   . Heart disease Sister   . Cancer Brother   . Colon cancer Neg Hx     Social History        Social History  . Marital status: Married    Spouse name: N/A  . Number of children: 3  . Years of education: 14   Occupational History  . 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 Smoking status: Former Smoker    Packs/day: 2.00    Years: 45.00    Types: Cigarettes    Quit date: 01/20/1998  . Smokeless tobacco: Former Systems developer  . Alcohol use No  . Drug use: No  . Sexual activity: Yes       Other Topics Concern  . Not on file      Social History Narrative   Lives at home w/ his wife   Right-handed   Drinks 1 cup of coffee per day          Current Outpatient Prescriptions  Medication Sig Dispense Refill  . atorvastatin (LIPITOR) 40 MG tablet Take 20 mg by mouth daily.    [name] b complex-vitamin c-folic acid (NEPHRO-VITE) 0.8 MG TABS Take 1 tablet by mouth daily.     . bromocriptine (PARLODEL) 5 MG capsule Take 5 mg by mouth at bedtime.     . calcium acetate (PHOSLO) 667 MG capsule Take 667 mg by mouth 4 (four) times daily.     . cetirizine (ZYRTEC) 10 MG tablet Take 10 mg by mouth at bedtime.     . dorzolamide-timolol (COSOPT) 22.3-6.8 MG/ML ophthalmic solution Place 1 drop into the right eye 2 (two) times daily.    [name] FLUoxetine (PROZAC) 20 MG capsule Take 40 mg by mouth daily.     . metoprolol tartrate (LOPRESSOR) 25 MG tablet TAKE 1 TABLET BY MOUTH TWICE DAILY 180 tablet 3  . midodrine (PROAMATINE) 5 MG tablet Take 15 mg  by mouth 3 (three) times daily as needed (for systolic blood pressure less than 130).     [name] omeprazole (PRILOSEC) 20 MG capsule Take 20 mg by mouth daily.    . predniSONE (DELTASONE) 5 MG tablet Take 5 mg by mouth daily.   1  . SENSIPAR 30 MG tablet Take 30 mg by mouth at bedtime.  5  . warfarin (COUMADIN) 5 MG tablet Take 1-1.5 tablets by mouth daily as directed by coumadin clinic 120 tablet 1  . enoxaparin (LOVENOX) 80 MG/0.8ML injection Inject 0.8 mLs (80 mg total) into the skin daily. (Patient not taking: Reported on 08/26/2016) 10 Syringe 0  . midodrine (PROAMATINE) 5 MG tablet TAKE 3 TABLETS BY MOUTH THREE TIMES DAILY AS NEEDED. (Patient not taking: Reported on 08/26/2016) 270 tablet 1   No current facility-administered medications for this visit.          Allergies  Allergen Reactions  . Codeine Nausea Only  . Penicillins Swelling    Has patient had a PCN reaction causing immediate rash, facial/tongue/throat swelling, SOB or lightheadedness with hypotension: Yes Has patient had a PCN reaction causing severe rash involving mucus membranes or skin necrosis: No Has patient had a PCN reaction that required hospitalization No Has patient had a PCN reaction occurring within the last 10 years: No If all of the above answers are "NO", then may proceed with Cephalosporin use.  . Tramadol Nausea Only      Review of Systems:              General:                      good appetite, poor energy, no weight gain, no weight loss, no fever             Cardiac:                       has chest pressure with exertion, no chest pressure at rest, has SOB with minimal exertion, no resting SOB, no PND, no orthopnea, has palpitations, has arrhythmia, paroxysmal atrial fibrillation, has LE edema, has dizzy spells, has had syncope             Respiratory:                 exertional shortness of breath, no home oxygen, has productive cough, no dry cough, no bronchitis, no wheezing, no  hemoptysis, no asthma, no pain with inspiration or cough, no sleep apnea, no CPAP at night             GI:                               no difficulty swallowing, has reflux, no frequent heartburn, no hiatal hernia, no abdominal pain, no constipation, no diarrhea, no hematochezia, no hematemesis, no melena             GU:                              no dysuria,  no frequency, no urinary tract infection, no hematuria, no enlarged prostate, has kidney stones, chronic kidney disease             Vascular:                     no pain suggestive of claudication, no pain in feet, has leg cramps, no varicose veins, no DVT, no non-healing foot ulcer             Neuro:                         prior stroke with some mild  residual left sided weakness and mild receptive aphasia, no TIA's, no seizures, no headaches, no temporary blindness one eye,  no slurred speech, no peripheral neuropathy, has chronic pain, some instability of gait, some memory/cognitive dysfunction             Musculoskeletal:         no arthritis, no joint swelling, has myalgias, some difficulty walking, reduced mobility              Skin:                            no rash, no itching, no skin infections, no pressure sores or ulcerations             Psych:                         no anxiety, some depression, no nervousness, has unusual recent stress due to wife's cancer diagnosis.             Eyes:                           Blind from macular degeneration             ENT:                            no hearing loss, no loose or painful teeth, no dentures, last saw dentist every 6 months             Hematologic:               has easy bruising on Coumadin, no abnormal bleeding, no clotting disorder, no frequent epistaxis             Endocrine:                   has diabetes, does not check CBG's at home, only at dialysis                                          Physical Exam:              BP 107/73 (BP Location: Right Arm, Patient  Position: Sitting, Cuff Size: Large)   Pulse 80   Resp 16   Ht 5\' 11"  (1.803 m)   Wt 210 lb (95.3 kg)   SpO2 98% Comment: RA  BMI 29.29 kg/m              General:                      Elderly but  well-appearing             HEENT:                       Unremarkable, NCAT, Corneal haze, EOMI, oropharynx clear, teeth appear in fair condition             Neck:                           no JVD, no bruits, no adenopathy or thyromegaly  Chest:                          clear to auscultation, symmetrical breath sounds, no wheezes, no rhonchi              CV:                              RRR, grade III/VI crescendo/decrescendo murmur heard best at RSB,  no diastolic murmur             Abdomen:                    soft, non-tender, no masses or organomegaly             Extremities:                 warm, well-perfused, pulses palpable but diminished dp and pt, minimal LE edema in ankles. Left arm AV fistula with strong thrill             Rectal/GU                   Deferred             Neuro:                         Grossly non-focal and symmetrical throughout             Skin:                            Clean and dry, no rashes, no breakdown   Diagnostic Tests:  [facility] [address], [place] [address] [phone]  ------------------------------------------------------------------- Transthoracic Echocardiography  Patient: Sair, Faulcon MR #: [id number] Study Date: 06/21/2016 Gender: M Age: 89 Height: 179.1 cm Weight: 96.6 kg BSA: 2.22 m^2 Pt. Status: Room:  SONOGRAPHER Oletta Lamas, Will ORDERING Barrett, Evelene Croon REFERRING Barrett, Evelene Croon ATTENDING Loralie Champagne, M.D. PERFORMING [facility], Outpatient  cc:  ------------------------------------------------------------------- LV EF: 60% -  65%  ------------------------------------------------------------------- Indications: (I48.1).  ------------------------------------------------------------------- History: PMH: ESRD. AS. Acquired from the patient and from the patient&'s chart. Atrial fibrillation. Atrial fibrillation. Risk factors: Hypertension. Diabetes mellitus. Dyslipidemia.  ------------------------------------------------------------------- Study Conclusions  - Left ventricle: The cavity size was normal. Wall thickness was increased in a pattern of mild LVH. Systolic function was normal. The estimated ejection fraction was in the range of 60% to 65%. Indeterminant diastolic function (atrial fibrillation). Wall motion was normal; there were no regional wall motion abnormalities. - Aortic valve: Trileaflet; severely calcified leaflets. There was moderate to severe stenosis. There was trivial regurgitation. Mean gradient (S): 33 mm Hg. Peak gradient (S): 61 mm Hg. Valve area (VTI): 0.81 cm^2. - Aorta: Ascending aortic diameter: 37 mm (S). - Ascending aorta: The ascending aorta was borderline dilated. - Mitral valve: Moderately calcified annulus. There was mild regurgitation. - Left atrium: The atrium was moderately dilated. - Right ventricle: The cavity size was normal. Systolic function was normal. - Right atrium: The atrium was moderately dilated. - Tricuspid valve: Peak RV-RA gradient (S): 35 mm Hg. - Pulmonary arteries: PA peak pressure: 43 mm Hg (S). - Systemic veins: IVC measured 2.0 cm with < 50% respirophasic variation, suggesting RA pressure 8 mmHg.  Impressions:  - The patient was in atrial fibrillation. Normal LV  size with mild LV hypertrophy. EF 60-65%. Normal RV size and systolic function. Moderate biatrial enlargement. The aortic valve was severely calcified with moderate to severe stenosis (moderate by mean gradient, severe by calculated  valve area). Would consider TEE to more closely assess the aortic valve.  ------------------------------------------------------------------- Labs, prior tests, procedures, and surgery: Permanent pacemaker system implantation.  ------------------------------------------------------------------- Study data: Comparison was made to the study of 11/28/2014. Study status: Routine. Procedure: The patient reported no pain pre or post test. Transthoracic echocardiography for left ventricular function evaluation and for assessment of valvular function. Image quality was adequate. Study completion: There were no complications. Transthoracic echocardiography. M-mode, complete 2D, spectral Doppler, and color Doppler. Birthdate: Patient birthdate: 03/11/1941. Age: Patient is 76 yr old. Sex: Gender: male. BMI: 30.1 kg/m^2. Blood pressure: 126/70 Patient status: Outpatient. Study date: Study date: 06/21/2016. Study time: 02:17 PM. Location: [facility]  -------------------------------------------------------------------  ------------------------------------------------------------------- Left ventricle: The cavity size was normal. Wall thickness was increased in a pattern of mild LVH. Systolic function was normal. The estimated ejection fraction was in the range of 60% to 65%. Indeterminant diastolic function (atrial fibrillation). Wall motion was normal; there were no regional wall motion abnormalities.  ------------------------------------------------------------------- Aortic valve: Trileaflet; severely calcified leaflets. Doppler: There was moderate to severe stenosis. There was trivial regurgitation. VTI ratio of LVOT to aortic valve: 0.67. Valve area (VTI): 0.81 cm^2. Indexed valve area (VTI): 1.05 cm^2/m^2. Peak velocity ratio of LVOT to aortic valve: 1.03. Indexed valve area (Vmax): 1.6 cm^2/m^2. Mean velocity ratio of LVOT to  aortic valve: 0.67. Mean gradient (S): 33 mm Hg. Peak gradient (S): 61 mm Hg.  ------------------------------------------------------------------- Aorta: Aortic root: The aortic root was normal in size. Ascending aorta: The ascending aorta was borderline dilated.  ------------------------------------------------------------------- Mitral valve: Moderately calcified annulus. Doppler: There was no evidence for stenosis. There was mild regurgitation. Peak gradient (D): 14 mm Hg.  ------------------------------------------------------------------- Left atrium: The atrium was moderately dilated.  ------------------------------------------------------------------- Right ventricle: The cavity size was normal. Systolic function was normal.  ------------------------------------------------------------------- Pulmonic valve: Structurally normal valve. Cusp separation was normal. Doppler: Transvalvular velocity was within the normal range. There was trivial regurgitation.  ------------------------------------------------------------------- Tricuspid valve: Doppler: There was mild regurgitation.  ------------------------------------------------------------------- Right atrium: The atrium was moderately dilated.  ------------------------------------------------------------------- Pericardium: There was no pericardial effusion.  ------------------------------------------------------------------- Systemic veins: IVC measured 2.0 cm with <50% respirophasic variation, suggesting RA pressure 8 mmHg.  ------------------------------------------------------------------- Measurements  Left ventricle Value Reference LV ID, ED, PLAX chordal 45.6 mm 43 - 52 LV ID, ES, PLAX chordal 29.1 mm 23 - 38 LV fx shortening, PLAX chordal 36 % >=29 LV PW  thickness, ED 14.1 mm --------- IVS/LV PW ratio, ED 0.99 <=1.3 Stroke volume, 2D 171 ml --------- Stroke volume/bsa, 2D 77 ml/m^2 ---------  Ventricular septum Value Reference IVS thickness, ED 14 mm ---------  LVOT Value Reference LVOT ID, S 21 mm --------- LVOT area 3.46 cm^2 --------- LVOT ID 21 mm --------- LVOT peak velocity, S 391 cm/s --------- LVOT mean velocity, S 163 cm/s --------- LVOT VTI, S 49.4 cm --------- LVOT peak gradient, S 61 mm Hg --------- Stroke volume (SV), LVOT DP 171.1 ml --------- Stroke index (SV/bsa), LVOT DP 77.2 ml/m^2 ---------  Aortic valve Value Reference Aortic valve peak velocity, S 381 cm/s --------- Aortic valve mean velocity, S 242 cm/s --------- Aortic valve VTI, S 73.7 cm --------- Aortic mean gradient, S 28 mm Hg --------- Aortic peak gradient, S 58 mm Hg --------- VTI ratio, LVOT/AV 0.67 --------- Aortic  valve area, VTI 2.32 cm^2 --------- Aortic valve area/bsa, VTI 1.05 cm^2/m^2 --------- Velocity ratio, peak, LVOT/AV 1.03 --------- Aortic valve area/bsa, peak 1.6 cm^2/m^2 --------- velocity Velocity ratio, mean, LVOT/AV 0.67 --------- Aortic  regurg pressure half-time 401 ms ---------  Aorta Value Reference Aortic root ID, ED 36 mm --------- Ascending aorta ID, A-P, S 37 mm ---------  Left atrium Value Reference LA ID, A-P, ES 46 mm --------- LA ID/bsa, A-P 2.08 cm/m^2 <=2.2 LA volume, S 115 ml --------- LA volume/bsa, S 51.9 ml/m^2 --------- LA volume, ES, 1-p A4C 100 ml --------- LA volume/bsa, ES, 1-p A4C 45.1 ml/m^2 --------- LA volume, ES, 1-p A2C 116 ml --------- LA volume/bsa, ES, 1-p A2C 52.3 ml/m^2 ---------  Mitral valve Value Reference Mitral E-wave peak velocity 190 cm/s --------- Mitral deceleration time (H) 282 ms 150 - 230 Mitral peak gradient, D 14 mm Hg ---------  Pulmonary arteries Value Reference PA pressure, S, DP (H) 43 mm Hg <=30  Tricuspid valve Value Reference Tricuspid regurg peak velocity 296 cm/s --------- Tricuspid peak RV-RA gradient 35 mm Hg ---------  Systemic veins Value Reference Estimated CVP 8 mm Hg ---------  Right ventricle Value Reference RV pressure, S, DP (H) 43 mm Hg <=30  Legend: (L) and (H) mark values outside specified reference range.  ------------------------------------------------------------------- Prepared  and Electronically Authenticated by  Loralie Champagne, M.D. 2018-03-26T21:44:38  Physicians   Panel Physicians Referring Physician Case Authorizing Physician  Burnell Blanks, MD (Primary)    Procedures   Right/Left Heart Cath and Coronary Angiography  Conclusion     Prox Cx to Mid Cx lesion, 20 %stenosed.  Ost 3rd Mrg to 3rd Mrg lesion, 20 %stenosed.  Prox LAD to Mid LAD lesion, 20 %stenosed.  Prox RCA to Dist RCA lesion, 10 %stenosed.  1. Mild non-obstructive CAD 2. Aortic stenosis (the mean gradient by cath is 14.9 mmHg). The mean gradient by echo is 40 mmHg and the valve is heavily calcified with poor leaflet excursion.   Recommendation: Continue planning for TAVR. He is scheduled to see our CT surgeons.    Indications   Severe aortic stenosis [I35.0 (ICD-10-CM)]  Procedural Details/Technique   Technical Details Indication: 76 yo male with severe AS, ESRD on HD, normal LV function. Workup for TAVR  Procedure: The risks, benefits, complications, treatment options, and expected outcomes were discussed with the patient. The patient and/or family concurred with the proposed plan, giving informed consent. The patient was further sedated with Versed and Fentanyl. The right groin was prepped and draped in the usual manner. Using the modified Seldinger access technique, a 5 French sheath was placed in the right femoral artery. A 7 French sheath was placed in the right femoral vein. Right heart cath performed with a balloon tipped catheter. Standard diagnostic catheters were used to perform selective coronary angiography. I crossed his aortic valve with an AL-2 catheter and straight wire. No left ventricular angiogram was performed.  There were no immediate complications. The patient was taken to the recovery area in stable condition. \   Estimated blood loss <50 mL.  During this procedure the patient was administered the following to achieve and maintain  moderate conscious sedation: Versed 0.5 mg, Fentanyl mcg, while the patient's heart rate, blood pressure, and oxygen saturation were continuously monitored. The period of conscious sedation was 32 minutes, of which I was present face-to-face 100% of this time.    Complications   Complications documented before study signed (08/20/2016 3:52 PM EDT)    RIGHT/LEFT HEART  CATH AND CORONARY ANGIOGRAPHY   None Documented by Burnell Blanks, MD 08/20/2016 3:51 PM EDT  Time Range: Intra-procedure      Coronary Findings   Dominance: Right  Left Anterior Descending  Vessel is large.  Prox LAD to Mid LAD lesion, 20% stenosed.  First Diagonal Branch  Vessel is moderate in size.  First Septal Branch  Vessel is small in size.  Second Diagonal Branch  Vessel is small in size.  Second Septal Branch  Vessel is small in size.  Third Diagonal Branch  Vessel is small in size.  Third Septal Branch  Vessel is small in size.  Left Circumflex  Vessel is large.  Prox Cx to Mid Cx lesion, 20% stenosed.  First Obtuse Marginal Branch  Vessel is small in size.  Second Obtuse Marginal Branch  Vessel is small in size.  Third Obtuse Marginal Branch  Vessel is large in size.  Ost 3rd Mrg to 3rd Mrg lesion, 20% stenosed.  Right Coronary Artery  Prox RCA to Dist RCA lesion, 10% stenosed.  Coronary Diagrams   Diagnostic Diagram       Implants        No implant documentation for this case.  PACS Images   Show images for Cardiac catheterization   Link to Procedure Log   Procedure Log    Hemo Data    Most Recent Value  Fick Cardiac Output 6.14 L/min  Fick Cardiac Output Index 2.91 (L/min)/BSA  Aortic Mean Gradient 14.9 mmHg  Aortic Peak Gradient 7 mmHg  Aortic Valve Area 2.63  Aortic Value Area Index 1.25 cm2/BSA  RA A Wave 15 mmHg  RA V Wave 16 mmHg  RA Mean 14 mmHg  RV Systolic Pressure 39 mmHg  RV Diastolic Pressure 9 mmHg  RV EDP 12 mmHg  PA Systolic  Pressure 49 mmHg  PA Diastolic Pressure 23 mmHg  PA Mean 33 mmHg  PW A Wave 25 mmHg  PW V Wave 20 mmHg  PW Mean 23 mmHg  AO Systolic Pressure 751 mmHg  AO Diastolic Pressure 70 mmHg  AO Mean 93 mmHg  LV Systolic Pressure 025 mmHg  LV Diastolic Pressure 12 mmHg  LV EDP 14 mmHg  Arterial Occlusion Pressure Extended Systolic Pressure 852 mmHg  Arterial Occlusion Pressure Extended Diastolic Pressure 68 mmHg  Arterial Occlusion Pressure Extended Mean Pressure 90 mmHg  Left Ventricular Apex Extended Systolic Pressure 778 mmHg  Left Ventricular Apex Extended Diastolic Pressure 7 mmHg  Left Ventricular Apex Extended EDP Pressure 20 mmHg  QP/QS 1  TPVR Index 11.34 HRUI  TSVR Index 31.95 HRUI  PVR SVR Ratio 0.13  TPVR/TSVR Ratio 0.35    *[facility]* [address], [place] [address] [phone]  ------------------------------------------------------------------- Noninvasive Vascular Lab  Carotid Duplex Study  Patient: Abem, Shaddix MR #: [id number] Study Date: 08/24/2016 Gender: M Age: 78 Height: 180.3 cm Weight: 90.7 kg BSA: 2.15 m^2 Pt. Status: Room:  ATTENDING [place], [place] SONOGRAPHER Oda Cogan, BS, RDMS, RVT  Reports also to:  ------------------------------------------------------------------- History and indications:  History  Pre-op evaluation Patient present with history of known carotid disease. Last study 12/05/12 showed right ICA 50-69% stenosis and left ICA chronic occlusion. Prior study from 12/05/12 is available for comparison. Prior R ICA results: 50-69% Prior L ICA results: Occlusion  ------------------------------------------------------------------- Study  information:  Study status: Routine. Procedure: The right CCA, right ECA, right ICA, right vertebral, left CCA,  left ECA, left ICA, and left vertebral arteries were examined. A vascular evaluation was performed with the patient in the supine position. Image quality was adequate. Carotid duplex study. Carotid Duplex exam including 2D imaging, color and spectral Doppler were performed on the extracranial carotid and vertebral arteries using standard established protocols. Birthdate: Patient birthdate: [birth year]. Age: Patient is 76 yr old. Sex: Gender: male. Height: Height: 180.3 cm. Height: 71 in. Weight: Weight: 90.7 kg. Weight: 199.6 lb. Body mass index: BMI: 27.9 kg/m^2. Body surface area: BSA: 2.15 m^2. Study date: Study date: 08/24/2016. Study time: 02:09 PM. Location: Vascular laboratory. Patient status: Outpatient.  Arterial flow:  +--------------+---------+--------+--------------+----------------+ !Location !V sys !V ed !Flow analysis !Comment ! +--------------+---------+--------+--------------+----------------+ !Right CCA - !66 cm/s !30 cm/s !--------------!----------------! !proximal ! ! ! ! ! +--------------+---------+--------+--------------+----------------+ !Right CCA - !41 cm/s !12 cm/s !--------------!----------------! !distal ! ! ! ! ! +--------------+---------+--------+--------------+----------------+ !Right ECA !-46 cm/s !-11 cm/s!--------------!----------------! +--------------+---------+--------+--------------+----------------+ !Right ICA - !-70 cm/s !-33 cm/s!--------------!----------------! !proximal ! ! ! ! ! +--------------+---------+--------+--------------+----------------+ !Right ICA - !142 cm/s !56 cm/s !--------------!moderate ! !mid  ! ! ! !heterogeneous ! ! ! ! ! !plaque with ! ! ! ! ! !tortuosity of ! ! ! ! ! !the vessel. ! +--------------+---------+--------+--------------+----------------+ !Right ICA - !-130 cm/s!-30 cm/s!--------------!tortuosity of ! !distal ! ! ! !the vessel. ! +--------------+---------+--------+--------------+----------------+ !Right !-64 cm/s !-18 cm/s!Antegrade flow!----------------! !vertebral ! ! ! ! ! +--------------+---------+--------+--------------+----------------+ !Right !75 cm/s !--------!--------------!----------------! !subclavian - ! ! ! ! ! !proximal ! ! ! ! ! +--------------+---------+--------+--------------+----------------+ !Left CCA - !28 cm/s !--------!--------------!----------------! !proximal ! ! ! ! ! +--------------+---------+--------+--------------+----------------+ !Left CCA - mid!43 cm/s !--------!--------------!----------------! +--------------+---------+--------+--------------+----------------+ !Left CCA - !29 cm/s !--------!--------------!----------------! !distal ! ! ! ! ! +--------------+---------+--------+--------------+----------------+ !Left ECA !-45 cm/s !-9 cm/s !--------------!----------------! +--------------+---------+--------+--------------+----------------+ !Left ICA - !0 cm/s !--------!--------------!chronic ! !proximal ! ! ! !occlusion. ! +--------------+---------+--------+--------------+----------------+ !Left ICA - mid!0 cm/s  !--------!--------------!chronic ! ! ! ! ! !occlusion. ! +--------------+---------+--------+--------------+----------------+ !Left ICA - !0 cm/s !--------!--------------!chronic ! !distal ! ! ! !occlusion. ! +--------------+---------+--------+--------------+----------------+ !Left vertebral!0 cm/s !--------!--------------!appeared ! ! ! ! ! !occluded. ! +--------------+---------+--------+--------------+----------------+ !Left !200 cm/s !--------!--------------!----------------! !subclavian - ! ! ! ! ! !proximal ! ! ! ! ! +--------------+---------+--------+--------------+----------------+  ------------------------------------------------------------------- Summary:  1. Right internal carotid artery demonstrated 40-59% stenosis by velocities. Left internal carotid artery demonstrated chronic occlusive disease, which is know. The left vertebral appeared occluded on this exam.  Other specific details can be found in the table(s) above. Prepared and Electronically Authenticated by  Leia Alf, MD 2018-05-29T20:27:42   ADDENDUM REPORT: 08/26/2016 08:10  EXAM: OVER-READ INTERPRETATION CT CHEST  The following report is an over-read performed by radiologist Dr. Rebekah Chesterfield [facility] on 08/26/2016. This over-read does not include interpretation of cardiac or coronary anatomy or pathology. The cardiac CTA interpretation by the cardiologist is attached.  COMPARISON: None.  FINDINGS: Extracardiac findings will be described separately under dictation for contemporaneously obtained CTA of the chest, abdomen and pelvis.  IMPRESSION: Please see separate dictation for  contemporaneously obtained CTA of the chest, abdomen and pelvis 08/24/2016 for full description of extracardiac findings.   Electronically Signed By: Vinnie Langton M.D. On: 08/26/2016 08:10   Addended by Etheleen Mayhew, MD on 08/26/2016 8:12 AM    Study Result   CLINICAL DATA: 76 year old male with severe aortic stenosis.  EXAM: Cardiac TAVR CT  TECHNIQUE: The patient was scanned on a Philips 256 scanner. A 120 kV retrospective scan was triggered in the descending thoracic aorta at 111 HU's. Gantry rotation speed was 270 msecs  and collimation was .9 mm. 10 mg of iv Metoprolol and no nitro were given. The 3D data set was reconstructed in 5% intervals of the R-R cycle. Systolic and diastolic phases were analyzed on a dedicated work station using MPR, MIP and VRT modes. The patient received 80 cc of contrast.  FINDINGS: Aortic Valve: Trileaflet, severely thickened with asymmetric calcifications predominantly in the non-coronary leaflet extending into the LVOT.  Aorta: Normal size, moderate diffuse calcifications, no dissection.  Sinotubular Junction: 29 x 29 mm with almost circumferential calcifications  Ascending Thoracic Aorta: 36 x 35 mm  Aortic Arch: 32 x 29 mm  Descending Thoracic Aorta: 29 x 28 mm  Sinus of Valsalva Measurements:  Non-coronary: 39 mm  Right -coronary: 38 mm  Left -coronary: 37 mm  Coronary Artery Height above Annulus:  Left Main: 10.6 mm  Right Coronary: 16 mm  Virtual Basal Annulus Measurements:  Maximum/Minimum Diameter: 30 x 22 mm  Perimeter: 570 mm2  Area: 87 mm  Optimum Fluoroscopic Angle for Delivery: LAO 1 CAU 1  No left atrial appendage thrombus (verified with delayed images).  No ASD/VSD.  Two chamber pacemaker with leads seen in the right atrium and right ventricle.  IMPRESSION: 1. Trileaflet, severely thickened with asymmetric calcifications predominantly in  the non-coronary leaflet extending into the LVOT. Annular measurements suitable for delivery of a 29 mm Edwards-SAPIEN 3 valve.  2. Sufficient RCA to annulus distance, however borderline LM to annulus distance measuring 10 mm.  3. Optimum Fluoroscopic Angle for Delivery: LAO 1 CAU 1  4. No left atrial appendage thrombus.  Ena Dawley  Electronically Signed: By: Ena Dawley On: 08/24/2016 12:09       CLINICAL DATA: 76 year old male with history of severe aortic stenosis. Preprocedural study prior to potential transcatheter aortic valve replacement (TAVR) procedure.  EXAM: CT ANGIOGRAPHY CHEST, ABDOMEN AND PELVIS  TECHNIQUE: Multidetector CT imaging through the chest, abdomen and pelvis was performed using the standard protocol during bolus administration of intravenous contrast. Multiplanar reconstructed images and MIPs were obtained and reviewed to evaluate the vascular anatomy.  CONTRAST: 70 mL of Isovue 370.  COMPARISON: CT the chest, abdomen and pelvis 10/05/2015.  FINDINGS: CTA CHEST FINDINGS  Cardiovascular: Heart size is enlarged with biatrial dilatation. There is no significant pericardial fluid, thickening or pericardial calcification. There is aortic atherosclerosis, as well as atherosclerosis of the great vessels of the mediastinum and the coronary arteries, including calcified atherosclerotic plaque in the left main, left anterior descending, left circumflex and right coronary arteries. Severe thickening calcification of the aortic valve. Calcification of the mitral annulus. Right-sided pacemaker device in place with lead tip terminating in the right atrial appendage and near the right ventricular apex along the free wall.  Mediastinum/Lymph Nodes: Multiple borderline enlarged and minimally enlarged mediastinal lymph nodes measuring up to 12 mm in short axis in the high right paratracheal nodal station (axial image 53  of series 5). Esophagus is unremarkable in appearance. No axillary lymphadenopathy.  Lungs/Pleura: Small left pleural effusion lying dependently. No right pleural effusion. No suspicious appearing pulmonary nodules or masses. No acute consolidative airspace disease.  Musculoskeletal/Soft Tissues: There are no aggressive appearing lytic or blastic lesions noted in the visualized portions of the skeleton.  CTA ABDOMEN AND PELVIS FINDINGS  Hepatobiliary: No cystic or solid hepatic lesions. No intra or extrahepatic biliary ductal dilatation. Gallbladder is normal in appearance.  Pancreas: No pancreatic mass. No pancreatic ductal dilatation. No pancreatic or peripancreatic fluid or inflammatory changes.  Spleen: Unremarkable.  Adrenals/Urinary Tract: Severe atrophy of the kidneys bilaterally. Multiple small low-attenuation lesions in both kidneys compatible with simple cysts. Other smaller subcentimeter low-attenuation lesions in both kidneys, too small to characterize, but favored to represent tiny cysts. Bilateral adrenal glands are normal in appearance. No hydroureteronephrosis. Urinary bladder is unremarkable in appearance.  Stomach/Bowel: Normal appearance of the stomach. No pathologic dilatation of small bowel or colon. Normal appendix. Numerous colonic diverticulae are noted, without surrounding inflammatory changes to suggest an acute diverticulitis at this time.  Vascular/Lymphatic: Aortic atherosclerosis, without evidence of aneurysm or dissection in the abdominal or pelvic vasculature. Vascular findings and measurements pertinent to potential TAVR procedure, as detailed below. Circumaortic left renal vein (normal anatomical variant) incidentally noted. Celiac axis, superior mesenteric artery and inferior mesenteric artery are all widely patent without hemodynamically significant stenosis. Single renal arteries are patent bilaterally. No lymphadenopathy noted  in the abdomen or pelvis.  Reproductive: 1.4 cm cyst in the median lobe of the prostate gland incidentally noted. Seminal vesicles are unremarkable in appearance.  Other: No significant volume of ascites. No pneumoperitoneum.  Musculoskeletal: There are no aggressive appearing lytic or blastic lesions noted in the visualized portions of the skeleton.  VASCULAR MEASUREMENTS PERTINENT TO TAVR:  AORTA:  Minimal Aortic Diameter - 16 x 15 mm  Severity of Aortic Calcification - severe  RIGHT PELVIS:  Right Common Iliac Artery -  Minimal Diameter - 5.6 x 6.8 mm  Tortuosity - mild  Calcification - severe  Right External Iliac Artery -  Minimal Diameter - 6.1 x 5.6 mm  Tortuosity - moderate  Calcification - moderate  Right Common Femoral Artery -  Minimal Diameter - 8.2 x 6.9 mm  Tortuosity - mild  Calcification - moderate  LEFT PELVIS:  Left Common Iliac Artery -  Minimal Diameter - 7.2 x 6.6 mm  Tortuosity - mild  Calcification - severe  Left External Iliac Artery -  Minimal Diameter - 7.6 x 7.4 mm  Tortuosity - moderate  Calcification - moderate  Left Common Femoral Artery -  Minimal Diameter - 7.9 x 7.2 mm  Tortuosity - mild  Calcification - moderate  Review of the MIP images confirms the above findings.  IMPRESSION: 1. Vascular findings and measurements pertinent to potential TAVR procedure, as detailed above. This patient does appear to have suitable pelvic arterial access bilaterally. 2. Severe thickening calcification of the aortic valve, compatible with the reported clinical history of severe aortic stenosis. 3. Small left pleural effusion lying dependently. 4. Multiple borderline enlarged and minimally enlarged mediastinal lymph nodes. This is nonspecific and stable compared to remote prior study from 10/05/2015, presumably benign. 5. Cardiomegaly with biatrial dilatation. 6. Colonic  diverticulosis without evidence of acute diverticulitis at this time. 7. Severe bilateral renal atrophy. 8. Additional incidental findings, as above.   Electronically Signed By: Vinnie Langton M.D. On: 08/26/2016 09:30   RISK SCORES About the STS Risk Calculator Procedure: AV Replacement  Risk of Mortality: 6.846%  Morbidity or Mortality: 37.422%  Long Length of Stay: 23.538%  Short Length of Stay: 10.16%  Permanent Stroke: 3.377%  Prolonged Ventilation: 31.13%  DSW Infection: 1.308%  Renal Failure: N/A  Reoperation: 13.752%   Impression:  This 76 year old gentleman has stage D severe, symptomatic aortic stenosis with NYHA class III symptoms of shortness of breath, fatigue and chest pressure with minimal exertion walking in his house and frequent episodes of dizziness. This is consistent with chronic diastolic heart failure. I have personally reviewed his 2D echo, TEE,  cath and CTA studies. His 2D echo in March showed a trileaflet aortic valve that had severe calcification, thickening and poor mobility of all three leaflets and looked like a severely stenotic valve. The mean gradient was measured at 33 mm Hg. The peak velocity ratio was measured at 1.03 and the indexed valve area 1.6 cm2/m2 which makes no sense given the appearance of the valve. The TEE showed a mean gradient of 25 mm Hg which I think is falsely low and again the appearance of the valve is that of severe AS. The cath showed no significant CAD. The mean gradient was recorded at 14.9 mm Hg which again makes no sense and I think must be a measurement error. His symptoms are consistent with severe AS and the appearance of his valve is consistent with severe AS. I agree that AVR is indicated for relief of his symptoms and to prevent progressive LV deterioration and to allow HD to occur without worsening hemodynamic instability. His risk for open surgical AVR is at least moderately elevated as not by the STS PROM  score but I think it would likely be higher. His cardiac CT shows significant calcification of his ascending aorta and he has severe cerebrovascular disease with  occluded left ICA and vertebral arteries. I think TAVR would be the best treatment for him. His gated cardiac CT shows anatomy suitable for TAVR using a Sapien 3 valve. The LM height is only 10 mm but is probably adequate. The abdominal and pelvic CT shows fairly severe calcified plaque in the aorto-iliac vessels although the lumen size appears large enough and the femoral vessels have minimal plaque. Transfemoral insertion should be possible.   The patient and his wife and daughter were counseled at length regarding treatment alternatives for management of severe symptomatic aortic stenosis. The risks and benefits of surgical intervention has been discussed in detail. Long-term prognosis with medical therapy was discussed. Alternative approaches such as conventional surgical aortic valve replacement, transcatheter aortic valve replacement, and palliative medical therapy were compared and contrasted at length. This discussion was placed in the context of the patient's own specific clinical presentation and past medical history. All of their questions have been addressed. The patient is eager to proceed with surgical management as soon as possible before his wife has to start chemotherapy.  Assuming we decide to proceed with transcatheter aortic valve replacement, a discussion was held regarding what types of management strategies would be attempted intraoperatively in the event of life-threatening complications, including whether or not the patient would be considered a candidate for the use of cardiopulmonary bypass and/or conversion to open sternotomy for attempted surgical intervention.   The patient has been advised of a variety of complications that might develop including but not limited to risks of death, stroke, paravalvular leak, aortic  dissection or other major vascular complications, aortic annulus rupture, device embolization, cardiac rupture or perforation, mitral regurgitation, acute myocardial infarction, arrhythmia, heart block or bradycardia requiring permanent pacemaker placement, congestive heart failure, respiratory failure, renal failure, pneumonia, infection, other late complications related to structural valve deterioration or migration, or other complications that might ultimately cause a temporary or permanent loss of functional independence or other long term morbidity. The patient provides full informed consent for the procedure as described and all questions were answered.   Plan:  Transfemoral TAVR using a Sapien 3 valve    Gaye Pollack, MD

## 2016-08-30 NOTE — Telephone Encounter (Signed)
I don't see an appointment ordered for that day.  It might be part of his TAVR follow up.  I'll see him while in the hospital.

## 2016-08-31 ENCOUNTER — Inpatient Hospital Stay (HOSPITAL_COMMUNITY): Payer: Medicare Other | Admitting: Emergency Medicine

## 2016-08-31 ENCOUNTER — Encounter (HOSPITAL_COMMUNITY)
Admission: RE | Disposition: A | Discharge status: Home / Self Care | Payer: Self-pay | Source: Ambulatory Visit | Attending: Surgery

## 2016-08-31 ENCOUNTER — Encounter (HOSPITAL_COMMUNITY): Payer: Self-pay | Admitting: Surgery

## 2016-08-31 ENCOUNTER — Inpatient Hospital Stay (HOSPITAL_COMMUNITY)
Admission: RE | Admit: 2016-08-31 | Discharge: 2016-09-02 | DRG: 266 | Disposition: A | Discharge status: Home / Self Care | Payer: Medicare Other | Source: Ambulatory Visit | Attending: Surgery | Admitting: Surgery

## 2016-08-31 ENCOUNTER — Inpatient Hospital Stay (HOSPITAL_COMMUNITY): Payer: Medicare Other

## 2016-08-31 ENCOUNTER — Encounter: Payer: Medicare Other | Admitting: Surgery

## 2016-08-31 ENCOUNTER — Ambulatory Visit (HOSPITAL_COMMUNITY)
Admission: RE | Admit: 2016-08-31 | Discharge: 2016-08-31 | Disposition: A | Discharge status: Home / Self Care | Payer: Medicare Other | Source: Ambulatory Visit | Attending: Cardiovascular Disease | Admitting: Cardiovascular Disease

## 2016-08-31 ENCOUNTER — Other Ambulatory Visit: Payer: Self-pay | Admitting: *Deleted

## 2016-08-31 DIAGNOSIS — H3530 Unspecified macular degeneration: Secondary | ICD-10-CM | POA: Diagnosis present

## 2016-08-31 DIAGNOSIS — F329 Major depressive disorder, single episode, unspecified: Secondary | ICD-10-CM | POA: Diagnosis present

## 2016-08-31 DIAGNOSIS — K219 Gastro-esophageal reflux disease without esophagitis: Secondary | ICD-10-CM | POA: Diagnosis present

## 2016-08-31 DIAGNOSIS — E1122 Type 2 diabetes mellitus with diabetic chronic kidney disease: Secondary | ICD-10-CM | POA: Diagnosis present

## 2016-08-31 DIAGNOSIS — I480 Paroxysmal atrial fibrillation: Secondary | ICD-10-CM | POA: Diagnosis present

## 2016-08-31 DIAGNOSIS — I132 Hypertensive heart and chronic kidney disease with heart failure and with stage 5 chronic kidney disease, or end stage renal disease: Secondary | ICD-10-CM | POA: Diagnosis present

## 2016-08-31 DIAGNOSIS — I350 Nonrheumatic aortic (valve) stenosis: Secondary | ICD-10-CM | POA: Diagnosis not present

## 2016-08-31 DIAGNOSIS — Z992 Dependence on renal dialysis: Secondary | ICD-10-CM

## 2016-08-31 DIAGNOSIS — I359 Nonrheumatic aortic valve disorder, unspecified: Secondary | ICD-10-CM | POA: Diagnosis not present

## 2016-08-31 DIAGNOSIS — I5032 Chronic diastolic (congestive) heart failure: Secondary | ICD-10-CM | POA: Diagnosis present

## 2016-08-31 DIAGNOSIS — D62 Acute posthemorrhagic anemia: Secondary | ICD-10-CM | POA: Diagnosis not present

## 2016-08-31 DIAGNOSIS — I69320 Aphasia following cerebral infarction: Secondary | ICD-10-CM

## 2016-08-31 DIAGNOSIS — I083 Combined rheumatic disorders of mitral, aortic and tricuspid valves: Secondary | ICD-10-CM | POA: Diagnosis not present

## 2016-08-31 DIAGNOSIS — H409 Unspecified glaucoma: Secondary | ICD-10-CM | POA: Diagnosis present

## 2016-08-31 DIAGNOSIS — D631 Anemia in chronic kidney disease: Secondary | ICD-10-CM | POA: Diagnosis not present

## 2016-08-31 DIAGNOSIS — Z800 Family history of malignant neoplasm of digestive organs: Secondary | ICD-10-CM | POA: Diagnosis not present

## 2016-08-31 DIAGNOSIS — I120 Hypertensive chronic kidney disease with stage 5 chronic kidney disease or end stage renal disease: Secondary | ICD-10-CM | POA: Diagnosis not present

## 2016-08-31 DIAGNOSIS — E119 Type 2 diabetes mellitus without complications: Secondary | ICD-10-CM | POA: Diagnosis not present

## 2016-08-31 DIAGNOSIS — Z87891 Personal history of nicotine dependence: Secondary | ICD-10-CM

## 2016-08-31 DIAGNOSIS — Z79899 Other long term (current) drug therapy: Secondary | ICD-10-CM

## 2016-08-31 DIAGNOSIS — I69354 Hemiplegia and hemiparesis following cerebral infarction affecting left non-dominant side: Secondary | ICD-10-CM

## 2016-08-31 DIAGNOSIS — Z952 Presence of prosthetic heart valve: Secondary | ICD-10-CM

## 2016-08-31 DIAGNOSIS — I2510 Atherosclerotic heart disease of native coronary artery without angina pectoris: Secondary | ICD-10-CM | POA: Diagnosis present

## 2016-08-31 DIAGNOSIS — Z006 Encounter for examination for normal comparison and control in clinical research program: Secondary | ICD-10-CM

## 2016-08-31 DIAGNOSIS — H548 Legal blindness, as defined in USA: Secondary | ICD-10-CM | POA: Diagnosis present

## 2016-08-31 DIAGNOSIS — E785 Hyperlipidemia, unspecified: Secondary | ICD-10-CM | POA: Diagnosis present

## 2016-08-31 DIAGNOSIS — Z823 Family history of stroke: Secondary | ICD-10-CM | POA: Diagnosis not present

## 2016-08-31 DIAGNOSIS — Z954 Presence of other heart-valve replacement: Secondary | ICD-10-CM | POA: Diagnosis not present

## 2016-08-31 DIAGNOSIS — Z8249 Family history of ischemic heart disease and other diseases of the circulatory system: Secondary | ICD-10-CM | POA: Diagnosis not present

## 2016-08-31 DIAGNOSIS — I10 Essential (primary) hypertension: Secondary | ICD-10-CM | POA: Diagnosis not present

## 2016-08-31 DIAGNOSIS — Z950 Presence of cardiac pacemaker: Secondary | ICD-10-CM

## 2016-08-31 DIAGNOSIS — D696 Thrombocytopenia, unspecified: Secondary | ICD-10-CM | POA: Diagnosis not present

## 2016-08-31 DIAGNOSIS — I700 Atherosclerosis of aorta: Secondary | ICD-10-CM | POA: Diagnosis not present

## 2016-08-31 DIAGNOSIS — I481 Persistent atrial fibrillation: Secondary | ICD-10-CM | POA: Diagnosis not present

## 2016-08-31 DIAGNOSIS — E1142 Type 2 diabetes mellitus with diabetic polyneuropathy: Secondary | ICD-10-CM | POA: Diagnosis present

## 2016-08-31 DIAGNOSIS — Z7901 Long term (current) use of anticoagulants: Secondary | ICD-10-CM | POA: Diagnosis not present

## 2016-08-31 DIAGNOSIS — N186 End stage renal disease: Secondary | ICD-10-CM | POA: Diagnosis present

## 2016-08-31 HISTORY — DX: Dyspnea, unspecified: R06.00

## 2016-08-31 HISTORY — DX: Unspecified glaucoma: H40.9

## 2016-08-31 HISTORY — PX: TRANSCATHETER AORTIC VALVE REPLACEMENT, TRANSFEMORAL: SHX6400

## 2016-08-31 HISTORY — DX: Legal blindness, as defined in USA: H54.8

## 2016-08-31 HISTORY — PX: TEE WITHOUT CARDIOVERSION: SHX5443

## 2016-08-31 HISTORY — DX: Presence of cardiac pacemaker: Z95.0

## 2016-08-31 LAB — POCT I-STAT, CHEM 8
BUN: 21 mg/dL — ABNORMAL HIGH (ref 6–20)
CHLORIDE: 101 mmol/L (ref 101–111)
Calcium, Ion: 0.96 mmol/L — ABNORMAL LOW (ref 1.15–1.40)
Creatinine, Ser: 6.1 mg/dL — ABNORMAL HIGH (ref 0.61–1.24)
Glucose, Bld: 125 mg/dL — ABNORMAL HIGH (ref 65–99)
HCT: 25 % — ABNORMAL LOW (ref 39.0–52.0)
Hemoglobin: 8.5 g/dL — ABNORMAL LOW (ref 13.0–17.0)
Potassium: 3.3 mmol/L — ABNORMAL LOW (ref 3.5–5.1)
SODIUM: 138 mmol/L (ref 135–145)
TCO2: 26 mmol/L (ref 0–100)

## 2016-08-31 LAB — CBC
HCT: 30.6 % — ABNORMAL LOW (ref 39.0–52.0)
HCT: 26.3 % — ABNORMAL LOW (ref 39.0–52.0)
HEMOGLOBIN: 8.5 g/dL — AB (ref 13.0–17.0)
Hemoglobin: 9.6 g/dL — ABNORMAL LOW (ref 13.0–17.0)
MCH: 32.1 pg (ref 26.0–34.0)
MCH: 32.8 pg (ref 26.0–34.0)
MCHC: 31.4 g/dL (ref 30.0–36.0)
MCHC: 32.3 g/dL (ref 30.0–36.0)
MCV: 102.3 fL — ABNORMAL HIGH (ref 78.0–100.0)
MCV: 101.5 fL — ABNORMAL HIGH (ref 78.0–100.0)
PLATELETS: 176 10*3/uL (ref 150–400)
PLATELETS: 126 10*3/uL — AB (ref 150–400)
RBC: 2.99 MIL/uL — ABNORMAL LOW (ref 4.22–5.81)
RBC: 2.59 MIL/uL — ABNORMAL LOW (ref 4.22–5.81)
RDW: 16.5 % — AB (ref 11.5–15.5)
RDW: 16.2 % — AB (ref 11.5–15.5)
WBC: 6.8 10*3/uL (ref 4.0–10.5)
WBC: 6.2 10*3/uL (ref 4.0–10.5)

## 2016-08-31 LAB — BLOOD GAS, ARTERIAL
Acid-Base Excess: 3.4 mmol/L — ABNORMAL HIGH (ref 0.0–2.0)
Bicarbonate: 27 mmol/L (ref 20.0–28.0)
Drawn by: 421801
O2 SAT: 97.1 %
PATIENT TEMPERATURE: 98.6
PO2 ART: 89.3 mmHg (ref 83.0–108.0)
pCO2 arterial: 38.1 mmHg (ref 32.0–48.0)
pH, Arterial: 7.465 — ABNORMAL HIGH (ref 7.350–7.450)

## 2016-08-31 LAB — COMPREHENSIVE METABOLIC PANEL
ALT: 24 U/L (ref 17–63)
ANION GAP: 13 (ref 5–15)
AST: 20 U/L (ref 15–41)
Albumin: 3.3 g/dL — ABNORMAL LOW (ref 3.5–5.0)
Alkaline Phosphatase: 61 U/L (ref 38–126)
BUN: 20 mg/dL (ref 6–20)
CHLORIDE: 101 mmol/L (ref 101–111)
CO2: 24 mmol/L (ref 22–32)
Calcium: 8.1 mg/dL — ABNORMAL LOW (ref 8.9–10.3)
Creatinine, Ser: 5.88 mg/dL — ABNORMAL HIGH (ref 0.61–1.24)
GFR, EST AFRICAN AMERICAN: 10 mL/min — AB (ref >60)
GFR, EST NON AFRICAN AMERICAN: 8 mL/min — AB (ref >60)
Glucose, Bld: 85 mg/dL (ref 65–99)
POTASSIUM: 3.4 mmol/L — AB (ref 3.5–5.1)
Sodium: 138 mmol/L (ref 135–145)
Total Bilirubin: 1 mg/dL (ref 0.3–1.2)
Total Protein: 5.9 g/dL — ABNORMAL LOW (ref 6.5–8.1)

## 2016-08-31 LAB — POCT I-STAT 3, ART BLOOD GAS (G3+)
Acid-Base Excess: 4 mmol/L — ABNORMAL HIGH (ref 0.0–2.0)
Acid-Base Excess: 1 mmol/L (ref 0.0–2.0)
BICARBONATE: 23.7 mmol/L (ref 20.0–28.0)
Bicarbonate: 28.4 mmol/L — ABNORMAL HIGH (ref 20.0–28.0)
Bicarbonate: 24.7 mmol/L (ref 20.0–28.0)
O2 SAT: 98 %
O2 Saturation: 100 %
O2 Saturation: 100 %
PCO2 ART: 41.4 mmHg (ref 32.0–48.0)
PCO2 ART: 29.8 mmHg — AB (ref 32.0–48.0)
PCO2 ART: 38.2 mmHg (ref 32.0–48.0)
PH ART: 7.445 (ref 7.350–7.450)
PH ART: 7.508 — AB (ref 7.350–7.450)
PO2 ART: 384 mmHg — AB (ref 83.0–108.0)
PO2 ART: 107 mmHg (ref 83.0–108.0)
Patient temperature: 36.3
TCO2: 30 mmol/L (ref 0–100)
TCO2: 25 mmol/L (ref 0–100)
TCO2: 26 mmol/L (ref 0–100)
pH, Arterial: 7.415 (ref 7.350–7.450)
pO2, Arterial: 174 mmHg — ABNORMAL HIGH (ref 83.0–108.0)

## 2016-08-31 LAB — APTT
APTT: 38 s — AB (ref 24–36)
aPTT: 41 seconds — ABNORMAL HIGH (ref 24–36)

## 2016-08-31 LAB — GLUCOSE, CAPILLARY
Glucose-Capillary: 89 mg/dL (ref 65–99)
Glucose-Capillary: 103 mg/dL — ABNORMAL HIGH (ref 65–99)
Glucose-Capillary: 114 mg/dL — ABNORMAL HIGH (ref 65–99)

## 2016-08-31 LAB — POCT I-STAT 4, (NA,K, GLUC, HGB,HCT)
Glucose, Bld: 111 mg/dL — ABNORMAL HIGH (ref 65–99)
Glucose, Bld: 103 mg/dL — ABNORMAL HIGH (ref 65–99)
HCT: 25 % — ABNORMAL LOW (ref 39.0–52.0)
HEMATOCRIT: 25 % — AB (ref 39.0–52.0)
HEMOGLOBIN: 8.5 g/dL — AB (ref 13.0–17.0)
HEMOGLOBIN: 8.5 g/dL — AB (ref 13.0–17.0)
POTASSIUM: 3.1 mmol/L — AB (ref 3.5–5.1)
Potassium: 3.1 mmol/L — ABNORMAL LOW (ref 3.5–5.1)
SODIUM: 141 mmol/L (ref 135–145)
Sodium: 140 mmol/L (ref 135–145)

## 2016-08-31 LAB — ABO/RH: ABO/RH(D): O POS

## 2016-08-31 LAB — PROTIME-INR
INR: 1.1
INR: 1.32
PROTHROMBIN TIME: 14.3 s (ref 11.4–15.2)
PROTHROMBIN TIME: 16.4 s — AB (ref 11.4–15.2)

## 2016-08-31 LAB — TYPE AND SCREEN
ABO/RH(D): O POS
Antibody Screen: NEGATIVE

## 2016-08-31 LAB — SURGICAL PCR SCREEN
MRSA, PCR: NEGATIVE
STAPHYLOCOCCUS AUREUS: POSITIVE — AB

## 2016-08-31 SURGERY — IMPLANTATION, AORTIC VALVE, TRANSCATHETER, FEMORAL APPROACH
Anesthesia: General

## 2016-08-31 MED ORDER — PROPOFOL 10 MG/ML IV BOLUS
INTRAVENOUS | Status: AC
  Filled 2016-08-31: qty 20

## 2016-08-31 MED ORDER — SODIUM CHLORIDE 0.9% FLUSH: 3.0000 mL | INTRAVENOUS | Status: DC | PRN

## 2016-08-31 MED ORDER — MUPIROCIN 2 % EX OINT
1.0000 "application " | TOPICAL_OINTMENT | Freq: Two times a day (BID) | CUTANEOUS | Status: DC
  Administered 2016-08-31 – 2016-09-02 (×3): 1 via NASAL
  Filled 2016-08-31 (×2): qty 22

## 2016-08-31 MED ORDER — LEVOFLOXACIN IN D5W 750 MG/150ML IV SOLN
750.0000 mg | INTRAVENOUS | Status: DC
  Filled 2016-08-31: qty 150

## 2016-08-31 MED ORDER — LACTATED RINGERS IV SOLN: INTRAVENOUS | Status: DC

## 2016-08-31 MED ORDER — ALBUMIN HUMAN 5 % IV SOLN
INTRAVENOUS | Status: DC | PRN
Start: 2016-08-31 — End: 2016-08-31
  Administered 2016-08-31: 15:00:00 via INTRAVENOUS

## 2016-08-31 MED ORDER — SODIUM CHLORIDE 0.9 % IV SOLN
0.0000 ug/min | INTRAVENOUS | Status: DC
  Filled 2016-08-31: qty 2

## 2016-08-31 MED ORDER — CHLORHEXIDINE GLUCONATE CLOTH 2 % EX PADS
6.0000 | MEDICATED_PAD | Freq: Every day | CUTANEOUS | Status: DC
  Administered 2016-09-02: 6 via TOPICAL

## 2016-08-31 MED ORDER — NITROGLYCERIN IN D5W 200-5 MCG/ML-% IV SOLN
0.0000 ug/min | INTRAVENOUS | Status: DC
Start: 2016-08-31 — End: 2016-09-01

## 2016-08-31 MED ORDER — NOREPINEPHRINE BITARTRATE 1 MG/ML IV SOLN
INTRAVENOUS | Status: DC | PRN
  Administered 2016-08-31: 4 ug/min via INTRAVENOUS

## 2016-08-31 MED ORDER — PHENYLEPHRINE HCL 10 MG/ML IJ SOLN
INTRAVENOUS | Status: DC | PRN
  Administered 2016-08-31: 50 ug/min via INTRAVENOUS

## 2016-08-31 MED ORDER — MUPIROCIN 2 % EX OINT
TOPICAL_OINTMENT | CUTANEOUS | Status: AC
  Filled 2016-08-31: qty 22

## 2016-08-31 MED ORDER — INSULIN ASPART 100 UNIT/ML ~~LOC~~ SOLN: 0.0000 [IU] | SUBCUTANEOUS | Status: DC

## 2016-08-31 MED ORDER — ALBUMIN HUMAN 5 % IV SOLN: 250.0000 mL | INTRAVENOUS | Status: DC | PRN

## 2016-08-31 MED ORDER — METOPROLOL TARTRATE 5 MG/5ML IV SOLN: 2.5000 mg | INTRAVENOUS | Status: DC | PRN

## 2016-08-31 MED ORDER — ACETAMINOPHEN 160 MG/5ML PO SOLN: 1000.0000 mg | Freq: Four times a day (QID) | ORAL | Status: DC

## 2016-08-31 MED ORDER — INSULIN REGULAR BOLUS VIA INFUSION
0.0000 [IU] | Freq: Three times a day (TID) | INTRAVENOUS | Status: DC
  Filled 2016-08-31: qty 10

## 2016-08-31 MED ORDER — CALCIUM ACETATE (PHOS BINDER) 667 MG PO CAPS
667.0000 mg | ORAL_CAPSULE | Freq: Three times a day (TID) | ORAL | Status: DC
  Administered 2016-09-01 – 2016-09-02 (×5): 667 mg via ORAL
  Filled 2016-08-31 (×5): qty 1

## 2016-08-31 MED ORDER — SODIUM CHLORIDE 0.9 % IV SOLN: 250.0000 mL | INTRAVENOUS | Status: DC | PRN

## 2016-08-31 MED ORDER — POTASSIUM CHLORIDE 10 MEQ/50ML IV SOLN
10.0000 meq | INTRAVENOUS | Status: DC | PRN
  Administered 2016-08-31: 10 meq via INTRAVENOUS
  Filled 2016-08-31 (×2): qty 50

## 2016-08-31 MED ORDER — ASPIRIN 81 MG PO CHEW: 324.0000 mg | CHEWABLE_TABLET | Freq: Every day | ORAL | Status: DC

## 2016-08-31 MED ORDER — SODIUM CHLORIDE 0.9 % IV SOLN
INTRAVENOUS | Status: DC
  Filled 2016-08-31: qty 1

## 2016-08-31 MED ORDER — FENTANYL CITRATE (PF) 100 MCG/2ML IJ SOLN: 50.0000 ug | INTRAMUSCULAR | Status: DC | PRN

## 2016-08-31 MED ORDER — LORATADINE 10 MG PO TABS
10.0000 mg | ORAL_TABLET | Freq: Every day | ORAL | Status: DC
  Administered 2016-08-31 – 2016-09-02 (×3): 10 mg via ORAL
  Filled 2016-08-31 (×3): qty 1

## 2016-08-31 MED ORDER — PANTOPRAZOLE SODIUM 40 MG PO TBEC: 40.0000 mg | DELAYED_RELEASE_TABLET | Freq: Every day | ORAL | Status: DC

## 2016-08-31 MED ORDER — HEPARIN SODIUM (PORCINE) 1000 UNIT/ML IJ SOLN
INTRAMUSCULAR | Status: DC | PRN
  Administered 2016-08-31: 14000 [IU] via INTRAVENOUS

## 2016-08-31 MED ORDER — MUPIROCIN 2 % EX OINT
1.0000 "application " | TOPICAL_OINTMENT | Freq: Once | CUTANEOUS | Status: AC
  Administered 2016-08-31: 13:00:00 via TOPICAL

## 2016-08-31 MED ORDER — LACTATED RINGERS IV SOLN: INTRAVENOUS | Status: DC | PRN

## 2016-08-31 MED ORDER — CHLORHEXIDINE GLUCONATE 0.12 % MT SOLN
15.0000 mL | OROMUCOSAL | Status: AC
  Administered 2016-08-31: 15 mL via OROMUCOSAL
  Filled 2016-08-31: qty 15

## 2016-08-31 MED ORDER — ROCURONIUM BROMIDE 100 MG/10ML IV SOLN
INTRAVENOUS | Status: DC | PRN
  Administered 2016-08-31: 50 mg via INTRAVENOUS

## 2016-08-31 MED ORDER — SODIUM CHLORIDE 0.9 % IV SOLN
250.0000 mL | INTRAVENOUS | Status: DC | PRN
Start: 2016-08-31 — End: 2016-09-01

## 2016-08-31 MED ORDER — SODIUM CHLORIDE 0.9% FLUSH
3.0000 mL | Freq: Two times a day (BID) | INTRAVENOUS | Status: DC
Start: 2016-08-31 — End: 2016-09-01

## 2016-08-31 MED ORDER — ACETAMINOPHEN 650 MG RE SUPP: 650.0000 mg | Freq: Once | RECTAL | Status: DC

## 2016-08-31 MED ORDER — IODIXANOL 320 MG/ML IV SOLN
INTRAVENOUS | Status: DC | PRN
  Administered 2016-08-31: 65.3 mL via INTRA_ARTERIAL

## 2016-08-31 MED ORDER — ACETAMINOPHEN 500 MG PO TABS
1000.0000 mg | ORAL_TABLET | Freq: Four times a day (QID) | ORAL | Status: DC
  Administered 2016-08-31 – 2016-09-01 (×2): 1000 mg via ORAL
  Filled 2016-08-31 (×2): qty 2

## 2016-08-31 MED ORDER — DORZOLAMIDE HCL-TIMOLOL MAL 2-0.5 % OP SOLN
1.0000 [drp] | Freq: Two times a day (BID) | OPHTHALMIC | Status: DC
  Administered 2016-08-31 – 2016-09-02 (×3): 1 [drp] via OPHTHALMIC
  Filled 2016-08-31 (×2): qty 10

## 2016-08-31 MED ORDER — VANCOMYCIN HCL IN DEXTROSE 1-5 GM/200ML-% IV SOLN
1000.0000 mg | Freq: Once | INTRAVENOUS | Status: AC
  Administered 2016-08-31: 1000 mg via INTRAVENOUS
  Filled 2016-08-31: qty 200

## 2016-08-31 MED ORDER — ACETAMINOPHEN 160 MG/5ML PO SOLN: 650.0000 mg | Freq: Once | ORAL | Status: DC

## 2016-08-31 MED ORDER — METOPROLOL TARTRATE 12.5 MG HALF TABLET
12.5000 mg | ORAL_TABLET | Freq: Two times a day (BID) | ORAL | Status: DC
  Administered 2016-08-31: 12.5 mg via ORAL
  Filled 2016-08-31: qty 1

## 2016-08-31 MED ORDER — ATORVASTATIN CALCIUM 20 MG PO TABS
20.0000 mg | ORAL_TABLET | Freq: Every day | ORAL | Status: DC
  Administered 2016-08-31 – 2016-09-01 (×2): 20 mg via ORAL
  Filled 2016-08-31 (×2): qty 1

## 2016-08-31 MED ORDER — 0.9 % SODIUM CHLORIDE (POUR BTL) OPTIME
TOPICAL | Status: DC | PRN
  Administered 2016-08-31: 1000 mL

## 2016-08-31 MED ORDER — PROTAMINE SULFATE 10 MG/ML IV SOLN
INTRAVENOUS | Status: DC | PRN
  Administered 2016-08-31: 140 mg via INTRAVENOUS

## 2016-08-31 MED ORDER — CINACALCET HCL 30 MG PO TABS
30.0000 mg | ORAL_TABLET | Freq: Every day | ORAL | Status: DC
Start: 2016-08-31 — End: 2016-09-01

## 2016-08-31 MED ORDER — BROMOCRIPTINE MESYLATE 2.5 MG PO TABS
5.0000 mg | ORAL_TABLET | Freq: Every day | ORAL | Status: DC
  Administered 2016-08-31 – 2016-09-01 (×2): 5 mg via ORAL
  Filled 2016-08-31 (×3): qty 2

## 2016-08-31 MED ORDER — SUGAMMADEX SODIUM 500 MG/5ML IV SOLN
INTRAVENOUS | Status: DC | PRN
  Administered 2016-08-31: 400 mg via INTRAVENOUS

## 2016-08-31 MED ORDER — CHLORHEXIDINE GLUCONATE 4 % EX LIQD: 30.0000 mL | CUTANEOUS | Status: DC

## 2016-08-31 MED ORDER — PROPOFOL 10 MG/ML IV BOLUS
INTRAVENOUS | Status: DC | PRN
  Administered 2016-08-31: 40 mg via INTRAVENOUS
  Administered 2016-08-31: 70 mg via INTRAVENOUS

## 2016-08-31 MED ORDER — RENA-VITE PO TABS
1.0000 | ORAL_TABLET | Freq: Every day | ORAL | Status: DC
  Administered 2016-08-31 – 2016-09-01 (×2): 1 via ORAL
  Filled 2016-08-31 (×2): qty 1

## 2016-08-31 MED ORDER — CALCIUM ACETATE 667 MG PO CAPS: 667.0000 mg | ORAL_CAPSULE | Freq: Four times a day (QID) | ORAL | Status: DC

## 2016-08-31 MED ORDER — SODIUM CHLORIDE 0.9 % IV SOLN
INTRAVENOUS | Status: DC | PRN
  Administered 2016-08-31 (×3): 500 mL

## 2016-08-31 MED ORDER — FENTANYL CITRATE (PF) 250 MCG/5ML IJ SOLN
INTRAMUSCULAR | Status: AC
  Filled 2016-08-31: qty 5

## 2016-08-31 MED ORDER — FLUOXETINE HCL 20 MG PO CAPS
40.0000 mg | ORAL_CAPSULE | Freq: Every day | ORAL | Status: DC
  Administered 2016-09-01 – 2016-09-02 (×2): 40 mg via ORAL
  Filled 2016-08-31 (×2): qty 2

## 2016-08-31 MED ORDER — FAMOTIDINE IN NACL 20-0.9 MG/50ML-% IV SOLN
20.0000 mg | INTRAVENOUS | Status: DC
  Administered 2016-08-31: 20 mg via INTRAVENOUS
  Filled 2016-08-31 (×2): qty 50

## 2016-08-31 MED ORDER — REMIFENTANIL HCL 1 MG IV SOLR
INTRAVENOUS | Status: DC | PRN
  Administered 2016-08-31: .5 ug/kg/min via INTRAVENOUS

## 2016-08-31 MED ORDER — ONDANSETRON HCL 4 MG/2ML IJ SOLN
4.0000 mg | Freq: Four times a day (QID) | INTRAMUSCULAR | Status: DC | PRN
Start: 2016-08-31 — End: 2016-09-01

## 2016-08-31 MED ORDER — BROMOCRIPTINE MESYLATE 5 MG PO CAPS: 5.0000 mg | ORAL_CAPSULE | Freq: Every day | ORAL | Status: DC

## 2016-08-31 MED ORDER — ASPIRIN EC 325 MG PO TBEC: 325.0000 mg | DELAYED_RELEASE_TABLET | Freq: Every day | ORAL | Status: DC

## 2016-08-31 MED ORDER — FENTANYL CITRATE (PF) 100 MCG/2ML IJ SOLN
INTRAMUSCULAR | Status: DC | PRN
  Administered 2016-08-31: 50 ug via INTRAVENOUS

## 2016-08-31 MED ORDER — SODIUM CHLORIDE 0.9% FLUSH: 3.0000 mL | Freq: Two times a day (BID) | INTRAVENOUS | Status: DC

## 2016-08-31 MED ORDER — METOPROLOL TARTRATE 25 MG/10 ML ORAL SUSPENSION: 12.5000 mg | Freq: Two times a day (BID) | ORAL | Status: DC

## 2016-08-31 MED ORDER — PREDNISONE 5 MG PO TABS
5.0000 mg | ORAL_TABLET | Freq: Every day | ORAL | Status: DC
Start: 2016-08-31 — End: 2016-09-02
  Administered 2016-08-31 – 2016-09-02 (×3): 5 mg via ORAL
  Filled 2016-08-31 (×4): qty 1

## 2016-08-31 SURGICAL SUPPLY — 97 items
ADAPTER UNIV SWAN GANZ BIP (ADAPTER) ×1 IMPLANT
ADAPTER UNV SWAN GANZ BIP (ADAPTER) ×2
BAG BANDED W/RUBBER/TAPE 36X54 (MISCELLANEOUS) ×3 IMPLANT
BAG DECANTER FOR FLEXI CONT (MISCELLANEOUS) IMPLANT
BAG SNAP BAND KOVER 36X36 (MISCELLANEOUS) ×6 IMPLANT
BLADE CLIPPER SURG (BLADE) IMPLANT
BLADE OSCILLATING /SAGITTAL (BLADE) IMPLANT
BLADE STERNUM SYSTEM 6 (BLADE) ×3 IMPLANT
CABLE PACING FASLOC BIEGE (MISCELLANEOUS) ×3 IMPLANT
CABLE PACING FASLOC BLUE (MISCELLANEOUS) ×3 IMPLANT
CANNULA FEM VENOUS REMOTE 22FR (CANNULA) IMPLANT
CANNULA OPTISITE PERFUSION 16F (CANNULA) IMPLANT
CANNULA OPTISITE PERFUSION 18F (CANNULA) IMPLANT
CATH DIAG EXPO 6F VENT PIG 145 (CATHETERS) ×6 IMPLANT
CATH EXPO 5FR AL1 (CATHETERS) ×3 IMPLANT
CATH S G BIP PACING (SET/KITS/TRAYS/PACK) ×6 IMPLANT
CLIP TI MEDIUM 24 (CLIP) ×3 IMPLANT
CLIP TI WIDE RED SMALL 24 (CLIP) ×3 IMPLANT
CONT SPEC 4OZ CLIKSEAL STRL BL (MISCELLANEOUS) ×6 IMPLANT
COVER BACK TABLE 24X17X13 BIG (DRAPES) ×3 IMPLANT
COVER BACK TABLE 60X90IN (DRAPES) ×3 IMPLANT
COVER BACK TABLE 80X110 HD (DRAPES) ×3 IMPLANT
COVER DOME SNAP 22 D (MISCELLANEOUS) ×3 IMPLANT
COVER MAYO STAND STRL (DRAPES) ×3 IMPLANT
COVER PROBE W GEL 5X96 (DRAPES) ×3 IMPLANT
CRADLE DONUT ADULT HEAD (MISCELLANEOUS) ×3 IMPLANT
DERMABOND ADVANCED (GAUZE/BANDAGES/DRESSINGS) ×4
DERMABOND ADVANCED .7 DNX12 (GAUZE/BANDAGES/DRESSINGS) ×2 IMPLANT
DEVICE CLOSURE PERCLS PRGLD 6F (VASCULAR PRODUCTS) ×2 IMPLANT
DRAPE INCISE IOBAN 66X45 STRL (DRAPES) IMPLANT
DRAPE SLUSH MACHINE 52X66 (DRAPES) ×3 IMPLANT
DRSG COVADERM 4X10 (GAUZE/BANDAGES/DRESSINGS) ×6 IMPLANT
DRSG TEGADERM 4X4.75 (GAUZE/BANDAGES/DRESSINGS) ×3 IMPLANT
ELECT REM PT RETURN 9FT ADLT (ELECTROSURGICAL) ×6
ELECTRODE REM PT RTRN 9FT ADLT (ELECTROSURGICAL) ×2 IMPLANT
FELT TEFLON 6X6 (MISCELLANEOUS) ×3 IMPLANT
FEMORAL VENOUS CANN RAP (CANNULA) IMPLANT
GAUZE SPONGE 4X4 12PLY STRL (GAUZE/BANDAGES/DRESSINGS) ×3 IMPLANT
GAUZE SPONGE 4X4 12PLY STRL LF (GAUZE/BANDAGES/DRESSINGS) ×6 IMPLANT
GLOVE BIO SURGEON STRL SZ8 (GLOVE) ×6 IMPLANT
GLOVE EUDERMIC 7 POWDERFREE (GLOVE) ×6 IMPLANT
GLOVE ORTHO TXT STRL SZ7.5 (GLOVE) ×6 IMPLANT
GOWN STRL REUS W/ TWL LRG LVL3 (GOWN DISPOSABLE) ×3 IMPLANT
GOWN STRL REUS W/ TWL XL LVL3 (GOWN DISPOSABLE) ×6 IMPLANT
GOWN STRL REUS W/TWL LRG LVL3 (GOWN DISPOSABLE) ×6
GOWN STRL REUS W/TWL XL LVL3 (GOWN DISPOSABLE) ×12
GUIDEWIRE SAF TJ AMPL .035X180 (WIRE) ×3 IMPLANT
GUIDEWIRE SAFE TJ AMPLATZ EXST (WIRE) ×3 IMPLANT
GUIDEWIRE STRAIGHT .035 260CM (WIRE) ×3 IMPLANT
GUIDEWIRE WHOLEY .035 145 JTIP (WIRE) ×3 IMPLANT
INSERT FOGARTY 61MM (MISCELLANEOUS) ×3 IMPLANT
INSERT FOGARTY SM (MISCELLANEOUS) IMPLANT
INSERT FOGARTY XLG (MISCELLANEOUS) IMPLANT
KIT BASIN OR (CUSTOM PROCEDURE TRAY) ×3 IMPLANT
KIT DILATOR VASC 18G NDL (KITS) IMPLANT
KIT HEART LEFT (KITS) ×3 IMPLANT
KIT ROOM TURNOVER OR (KITS) ×3 IMPLANT
KIT SUCTION CATH 14FR (SUCTIONS) ×6 IMPLANT
NEEDLE PERC 18GX7CM (NEEDLE) ×3 IMPLANT
NS IRRIG 1000ML POUR BTL (IV SOLUTION) ×9 IMPLANT
PACK AORTA (CUSTOM PROCEDURE TRAY) ×3 IMPLANT
PAD ARMBOARD 7.5X6 YLW CONV (MISCELLANEOUS) ×6 IMPLANT
PAD ELECT DEFIB RADIOL ZOLL (MISCELLANEOUS) ×3 IMPLANT
PERCLOSE PROGLIDE 6F (VASCULAR PRODUCTS) ×6
SET MICROPUNCTURE 5F STIFF (MISCELLANEOUS) ×3 IMPLANT
SHEATH AVANTI 11CM 8FR (MISCELLANEOUS) ×3 IMPLANT
SHEATH PINNACLE 6F 10CM (SHEATH) ×6 IMPLANT
SLEEVE REPOSITIONING LENGTH 30 (MISCELLANEOUS) ×3 IMPLANT
SPONGE LAP 4X18 X RAY DECT (DISPOSABLE) ×3 IMPLANT
STOPCOCK MORSE 400PSI 3WAY (MISCELLANEOUS) ×18 IMPLANT
SUT ETHIBOND X763 2 0 SH 1 (SUTURE) IMPLANT
SUT GORETEX CV 4 TH 22 36 (SUTURE) IMPLANT
SUT GORETEX CV4 TH-18 (SUTURE) IMPLANT
SUT GORETEX TH-18 36 INCH (SUTURE) IMPLANT
SUT PROLENE 3 0 SH1 36 (SUTURE) IMPLANT
SUT PROLENE 4 0 RB 1 (SUTURE)
SUT PROLENE 4-0 RB1 .5 CRCL 36 (SUTURE) IMPLANT
SUT PROLENE 5 0 C 1 36 (SUTURE) IMPLANT
SUT PROLENE 6 0 C 1 30 (SUTURE) IMPLANT
SUT SILK  1 MH (SUTURE) ×2
SUT SILK 1 MH (SUTURE) ×1 IMPLANT
SUT SILK 2 0 SH CR/8 (SUTURE) IMPLANT
SUT VIC AB 2-0 CT1 27 (SUTURE)
SUT VIC AB 2-0 CT1 TAPERPNT 27 (SUTURE) IMPLANT
SUT VIC AB 2-0 CTX 36 (SUTURE) IMPLANT
SUT VIC AB 3-0 SH 8-18 (SUTURE) IMPLANT
SYR 10ML LL (SYRINGE) ×9 IMPLANT
SYR 30ML LL (SYRINGE) ×6 IMPLANT
SYR 50ML LL SCALE MARK (SYRINGE) ×3 IMPLANT
TOWEL OR 17X26 10 PK STRL BLUE (TOWEL DISPOSABLE) ×6 IMPLANT
TRANSDUCER W/STOPCOCK (MISCELLANEOUS) ×6 IMPLANT
TRAY FOLEY SILVER 16FR TEMP (SET/KITS/TRAYS/PACK) ×3 IMPLANT
TUBING HIGH PRESSURE 120CM (CONNECTOR) ×3 IMPLANT
VALVE HEART TRANSCATH SZ3 29MM (Prosthesis & Implant Heart) ×3 IMPLANT
WIRE AMPLATZ SS-J .035X180CM (WIRE) ×3 IMPLANT
WIRE BENTSON .035X145CM (WIRE) ×3 IMPLANT
WIRE MINI STICK MAX (SHEATH) ×3 IMPLANT

## 2016-08-31 NOTE — Anesthesia Preprocedure Evaluation (Addendum)
Anesthesia Evaluation  Patient identified by MRN, date of birth, ID band Patient awake    Reviewed: Allergy & Precautions, NPO status , Patient's Chart, lab work & pertinent test results  Airway Mallampati: II  TM Distance: >3 FB Neck ROM: Full    Dental  (+) Teeth Intact, Dental Advisory Given   Pulmonary former smoker,    breath sounds clear to auscultation       Cardiovascular hypertension,  Rhythm:Irregular Rate:Normal     Neuro/Psych    GI/Hepatic   Endo/Other  diabetes  Renal/GU      Musculoskeletal   Abdominal   Peds  Hematology   Anesthesia Other Findings   Reproductive/Obstetrics                            Anesthesia Physical Anesthesia Plan  ASA: III  Anesthesia Plan: General   Post-op Pain Management:    Induction: Intravenous  PONV Risk Score and Plan: Ondansetron  Airway Management Planned: Oral ETT  Additional Equipment: CVP, Arterial line and 3D TEE  Intra-op Plan:   Post-operative Plan: Extubation in OR  Informed Consent: I have reviewed the patients History and Physical, chart, labs and discussed the procedure including the risks, benefits and alternatives for the proposed anesthesia with the patient or authorized representative who has indicated his/her understanding and acceptance.   Dental advisory given  Plan Discussed with: CRNA and Anesthesiologist  Anesthesia Plan Comments:        Anesthesia Quick Evaluation

## 2016-08-31 NOTE — Brief Op Note (Signed)
08/31/2016  3:41 PM  PATIENT:  [name]  76 y.o. male  PRE-OPERATIVE DIAGNOSIS:  SEVERE AS  POST-OPERATIVE DIAGNOSIS:  SEVERE AS  PROCEDURE:  Procedure(s): TRANSCATHETER AORTIC VALVE REPLACEMENT, TRANSFEMORAL (N/A) TRANSESOPHAGEAL ECHOCARDIOGRAM (TEE) (N/A)  SURGEON:  Surgeon(s) and Role:    * Burnell Blanks, MD - Primary    * Garon Melander, Fernande Boyden, MD - Co-Surgeon (only for TAVR, EVAR, and Barostim procedure)  PHYSICIAN ASSISTANT: none   ANESTHESIA:   general  EBL:  Total I/O In: 1250 [I.V.:1000; IV Piggyback:250] Out: -   BLOOD ADMINISTERED:none  DRAINS: none   LOCAL MEDICATIONS USED:  NONE  SPECIMEN:  No Specimen  DISPOSITION OF SPECIMEN:  N/A  COUNTS:  YES  TOURNIQUET:  * No tourniquets in log *  DICTATION: .Note written in EPIC  PLAN OF CARE: Admit to inpatient   PATIENT DISPOSITION:  ICU - extubated and stable.   Delay start of Pharmacological VTE agent (>24hrs) due to surgical blood loss or risk of bleeding: yes

## 2016-08-31 NOTE — Progress Notes (Signed)
Patient ID: Alexander [name], male   DOB: May 26, 1940, 76 y.o.   MRN: [id number]  SICU Evening Rounds:  Hemodynamically stable  Atrial fib 80's  Awake and alert  No murmur  Groin access sites ok  BMET    Component Value Date/Time   NA 141 08/31/2016 1633   NA 139 08/19/2016 0923   K 3.1 (L) 08/31/2016 1633   CL 101 08/31/2016 1519   CO2 24 08/31/2016 1207   GLUCOSE 103 (H) 08/31/2016 1633   BUN 21 (H) 08/31/2016 1519   BUN 22 08/19/2016 0923   CREATININE 6.10 (H) 08/31/2016 1519   CREATININE 3.61 (H) 01/02/2016 1312   CALCIUM 8.1 (L) 08/31/2016 1207   GFRNONAA 8 (L) 08/31/2016 1207   GFRAA 10 (L) 08/31/2016 1207   CBC    Component Value Date/Time   WBC 6.2 08/31/2016 1630   RBC 2.59 (L) 08/31/2016 1630   HGB 8.5 (L) 08/31/2016 1633   HCT 25.0 (L) 08/31/2016 1633   HCT 34.5 (L) 08/19/2016 0923   PLT 126 (L) 08/31/2016 1630   PLT 186 08/19/2016 0923   MCV 101.5 (H) 08/31/2016 1630   MCV 102 (H) 08/19/2016 0923   MCH 32.8 08/31/2016 1630   MCHC 32.3 08/31/2016 1630   RDW 16.2 (H) 08/31/2016 1630   RDW 17.5 (H) 08/19/2016 0923   LYMPHSABS 1,340 01/02/2016 1312   MONOABS 536 01/02/2016 1312   EOSABS 67 01/02/2016 1312   BASOSABS 0 01/02/2016 1312    A/P: stable postop course. K+ is 3.1 so gave him 10 meq only due to ESRD.

## 2016-08-31 NOTE — CV Procedure (Signed)
[facility]  TAVR OPERATIVE NOTE   Date of Procedure:  08/31/2016  Preoperative Diagnosis: Severe Aortic Stenosis   Postoperative Diagnosis: Same   Procedure:    Transcatheter Aortic Valve Replacement - Transfemoral Approach  Edwards Sapien 3 THV (size 29 mm, model # [id number], serial # [id number])   Co-Surgeons:  Gaye Pollack, MD and Lauree Chandler, MD  Anesthesiologist:  Linna Caprice  Echocardiographer:  Meda Coffee  Pre-operative Echo Findings:  Severe aortic stenosis  Normal left ventricular systolic function  Post-operative Echo Findings:  Trivial paravalvular leak  Normal left ventricular systolic function  BRIEF CLINICAL NOTE AND INDICATIONS FOR SURGERY  76 yo male with history of bradycardia s/p pacemaker placement, DM, HTN, HLD, ESRD on HD, PAF, prior CVA, carotid artery disease and severe aortic stenosis who is here today for TAVR. He is followed by DR. Randoloph. He is known to have aortic stenosis which has progressed over the last year. He has had ongoing issues with hypotension on dialysis requiring midodrine therapy. Pacemaker placed in 2016 due to bradycardia with dizziness. He has been followed in the EP clinic for paroxysmal atrial fibrillation and has failed cardioversion. He is on coumadin. Cardiac cath with  Mild CAD. Echo with heavily calcified valve with mean gradient around 40 mmHg.   During the course of the patient's preoperative work up they have been evaluated comprehensively by a multidisciplinary team of specialists coordinated through the [facility] in the [facility].  They have been demonstrated to suffer from symptomatic severe aortic stenosis as noted above. The patient has been counseled extensively as to the relative risks and benefits of all options for the treatment of severe aortic stenosis including long term medical therapy, conventional surgery for aortic valve replacement,  and transcatheter aortic valve replacement.  The patient has been independently evaluated by two cardiac surgeons including Dr Roxy Manns and Dr. Cyndia Bent. and they are felt to be at high risk for conventional surgical aortic valve replacement. Both surgeons indicated the patient would be a poor candidate for conventional surgery because of comorbidities including ESRD on HD, advanced age, prior CVA. Based upon review of all of the patient's preoperative diagnostic tests they are felt to be candidate for transcatheter aortic valve replacement using the transfemoral approach as an alternative to high risk conventional surgery.    Following the decision to proceed with transcatheter aortic valve replacement, a discussion has been held regarding what types of management strategies would be attempted intraoperatively in the event of life-threatening complications, including whether or not the patient would be considered a candidate for the use of cardiopulmonary bypass and/or conversion to open sternotomy for attempted surgical intervention.  The patient has been advised of a variety of complications that might develop peculiar to this approach including but not limited to risks of death, stroke, paravalvular leak, aortic dissection or other major vascular complications, aortic annulus rupture, device embolization, cardiac rupture or perforation, acute myocardial infarction, arrhythmia, heart block or bradycardia requiring permanent pacemaker placement, congestive heart failure, respiratory failure, renal failure, pneumonia, infection, other late complications related to structural valve deterioration or migration, or other complications that might ultimately cause a temporary or permanent loss of functional independence or other long term morbidity.  The patient provides full informed consent for the procedure as described and all questions were answered preoperatively.    DETAILS OF THE OPERATIVE  PROCEDURE  PREPARATION:   The patient is brought to the operating room on the above  mentioned date and central monitoring was established by the anesthesia team including placement of Swan-Ganz catheter and radial arterial line. The patient is placed in the supine position on the operating table.  Intravenous antibiotics are administered. General endotracheal anesthesia is induced uneventfully. A Foley catheter is placed.  Baseline transesophageal echocardiogram was performed. The patient's chest, abdomen, both groins, and both lower extremities are prepared and draped in a sterile manner. A time out procedure is performed.   PERIPHERAL ACCESS:   Using the modified Seldinger technique, femoral arterial and venous access were obtained with placement of 6 Fr sheaths on the left side.  A pigtail diagnostic catheter was passed through the femoral arterial sheath under fluoroscopic guidance into the aortic root.  A temporary transvenous pacemaker catheter was passed through the femoral venous sheath under fluoroscopic guidance into the right ventricle.  The pacemaker was tested to ensure stable lead placement and pacemaker capture. Aortic root angiography was performed in order to determine the optimal angiographic angle for valve deployment.  TRANSFEMORAL ACCESS:  A micropuncture kit was used to obtain access in the right femoral artery. Position confirmed with angiography. Pre-closure with double ProGlide closure devices. The patient was heparinized systemically and ACT verified > 250 seconds.    A 16 Fr transfemoral E-sheath was introduced into the right femoral artery after progressively dilating over an Amplatz superstiff wire. An AL-2 catheter was used to direct a straight-tip exchange length wire across the native aortic valve into the left ventricle. This was exchanged out for a pigtail catheter and position was confirmed in the LV apex. Simultaneous LV and Ao pressures were recorded.  The pigtail  catheter was then exchanged for an Amplatz Extra-stiff wire in the LV apex.   TRANSCATHETER HEART VALVE DEPLOYMENT:  An Edwards Sapien 3 THV (size 29 mm) was prepared and crimped per manufacturer's guidelines, and the proper orientation of the valve is confirmed on the [employer] delivery system. The valve was advanced through the introducer sheath using normal technique until in an appropriate position in the abdominal aorta beyond the sheath tip. The balloon was then retracted and using the fine-tuning wheel was centered on the valve. The valve was then advanced across the aortic arch using appropriate flexion of the catheter. The valve was carefully positioned across the aortic valve annulus. The Commander catheter was retracted using normal technique. Once final position of the valve has been confirmed by angiographic assessment, the valve is deployed while temporarily holding ventilation and during rapid ventricular pacing to maintain systolic blood pressure < 50 mmHg and pulse pressure < 10 mmHg. The balloon inflation is held for >3 seconds after reaching full deployment volume. Once the balloon has fully deflated the balloon is retracted into the ascending aorta and valve function is assessed using TEE. There is felt to be trivial paravalvular leak and no central aortic insufficiency.  The patient's hemodynamic recovery following valve deployment is good.  The deployment balloon and guidewire are both removed. Echo demostrated acceptable post-procedural gradients, stable mitral valve function, and trivial AI.    PROCEDURE COMPLETION:  The sheath was then removed and closure completed. Protamine was administered once femoral arterial repair was complete. The temporary pacemaker, pigtail catheters and femoral sheaths were removed with manual pressure used for hemostasis.   The patient tolerated the procedure well and is transported to the surgical intensive care in stable condition. There were no  immediate intraoperative complications. All sponge instrument and needle counts are verified correct at completion of the  operation.   No blood products were administered during the operation.  The patient received a total of 65.3 mL of intravenous contrast during the procedure.  Lauree Chandler MD 08/31/2016 1:09 PM

## 2016-08-31 NOTE — Transfer of Care (Signed)
Immediate Anesthesia Transfer of Care Note  Patient: KEONI RISINGER  Procedure(s) Performed: Procedure(s): TRANSCATHETER AORTIC VALVE REPLACEMENT, TRANSFEMORAL (N/A) TRANSESOPHAGEAL ECHOCARDIOGRAM (TEE) (N/A)  Patient Location: PACU  Anesthesia Type:General  Level of Consciousness: awake, alert , oriented and patient cooperative  Airway & Oxygen Therapy: Patient Spontanous Breathing and Patient connected to nasal cannula oxygen  Post-op Assessment: Report given to RN, Post -op Vital signs reviewed and stable and Patient moving all extremities  Post vital signs: Reviewed and stable  Last Vitals:  Vitals:   08/31/16 1448 08/31/16 1600  BP:  93/63  Pulse: (!) 122 90  Resp:  16  Temp:      Last Pain:  Vitals:   08/31/16 1126  TempSrc: Oral         Complications: No apparent anesthesia complications

## 2016-08-31 NOTE — Anesthesia Postprocedure Evaluation (Signed)
Anesthesia Post Note  Patient: Alexander [name]  Procedure(s) Performed: Procedure(s) (LRB): TRANSCATHETER AORTIC VALVE REPLACEMENT, TRANSFEMORAL (N/A) TRANSESOPHAGEAL ECHOCARDIOGRAM (TEE) (N/A)     Patient location during evaluation: SICU Anesthesia Type: General Level of consciousness: awake, awake and alert, oriented, sedated and patient cooperative Pain management: pain level controlled Vital Signs Assessment: post-procedure vital signs reviewed and stable Respiratory status: spontaneous breathing, nonlabored ventilation, respiratory function stable and patient connected to nasal cannula oxygen Cardiovascular status: blood pressure returned to baseline Anesthetic complications: no    Last Vitals:  Vitals:   08/31/16 1600 08/31/16 1642  BP: 93/63   Pulse: 90   Resp: 16   Temp:  36.4 C    Last Pain:  Vitals:   08/31/16 1642  TempSrc: Oral                 Enyla Lisbon COKER

## 2016-08-31 NOTE — Anesthesia Procedure Notes (Signed)
Central Venous Catheter Insertion Performed by: Roberts Gaudy, anesthesiologist Start/End6/07/2016 1:00 PM, 08/31/2016 1:05 PM Patient location: Pre-op. Preanesthetic checklist: patient identified, IV checked, site marked, risks and benefits discussed, surgical consent, monitors and equipment checked, pre-op evaluation and timeout performed Position: supine Hand hygiene performed , maximum sterile barriers used  and Seldinger technique used Catheter size: 8 Fr Central line was placed.Double lumen Procedure performed using ultrasound guided technique. Ultrasound Notes:anatomy identified, needle tip was noted to be adjacent to the nerve/plexus identified, no ultrasound evidence of intravascular and/or intraneural injection and image(s) printed for medical record Attempts: 1 Following insertion, line sutured, Biopatch and dressing applied. Post procedure assessment: blood return through all ports, free fluid flow and no air  Patient tolerated the procedure well with no immediate complications.

## 2016-08-31 NOTE — Interval H&P Note (Signed)
History and Physical Interval Note:  08/31/2016 12:17 PM  Alexander [name]  has presented today for surgery, with the diagnosis of SEVERE AS  The various methods of treatment have been discussed with the patient and family. After consideration of risks, benefits and other options for treatment, the patient has consented to  Procedure(s): TRANSCATHETER AORTIC VALVE REPLACEMENT, TRANSFEMORAL (N/A) TRANSESOPHAGEAL ECHOCARDIOGRAM (TEE) (N/A) as a surgical intervention .  The patient's history has been reviewed, patient examined, no change in status, stable for surgery.  I have reviewed the patient's chart and labs.  Questions were answered to the patient's satisfaction.     Gaye Pollack

## 2016-08-31 NOTE — Op Note (Signed)
[facility]   MULTIDISCIPLINARY HEART VALVE TEAM   TAVR OPERATIVE NOTE   Date of Procedure:  08/31/2016  Preoperative Diagnosis: Severe Aortic Stenosis   Postoperative Diagnosis: Same   Procedure:    Transcatheter Aortic Valve Replacement - Percutaneous right Transfemoral Approach  Edwards Sapien 3 THV (size 29 mm, model # [id number], serial # [id number])   Co-Surgeons: Gaye Pollack, MD and Lauree Chandler, MD       Anesthesiologist:  Roberts Gaudy, MD  Echocardiographer:  Ena Dawley, MD  Pre-operative Echo Findings:  Severe aortic stenosis  Normal left ventricular systolic function  Post-operative Echo Findings:  Trivial paravalvular leak   Normal left ventricular systolic function   BRIEF CLINICAL NOTE AND INDICATIONS FOR SURGERY  The patient is a 76 year old gentleman with diabetes, hypertension, hyperlipidemia, ESRD on HD, PAF on Coumadin, prior stroke in 2004 with some left sided weakness and aphasia, bradycardia s/p PPM and aortic stenosis. Over the past year he has noted progressive exertional shortness of breath and fatigue. Recently this has been occurring with walking around his house. He has had occasional chest pressure with exertion and frequent dizziness with some episodes of syncope and falls. He has had some problems with hypotension on dialysis and takes Midodrine on his dialysis days. An echo on 06/21/2016 showed a normal LVEF of 60-65%. The aortic valve is trileaflet with severe leaflet calcification with a mean gradient of 33 mm Hg and a peak of 61 mm Hg. A nuclear stress test on 07/08/2016 showed an LVEF of 55-65% with no ischemia. He underwent a TEE by [name] on 4/26 which showed a mean AV gradient of 25 mm Hg and a peak of 40 mm Hg. LVEF was normal. He was referred to Dr. Angelena Form for consideration of TAVR and cath was done on 5/25 showing only mild non-obstructive CAD. The mean AV gradient was only measured at 14.9 mm Hg.  He  has stage D severe, symptomatic aortic stenosis with NYHA class III symptoms of shortness of breath, fatigue and chest pressure with minimal exertion walking in his house and frequent episodes of dizziness. This is consistent with chronic diastolic heart failure. I have personally reviewed his 2D echo, TEE, cath and CTA studies. His 2D echo in March showed a trileaflet aortic valve that had severe calcification, thickening and poor mobility of all three leaflets and looked like a severely stenotic valve. The mean gradient was measured at 33 mm Hg. The peak velocity ratio was measured at 1.03 and the indexed valve area 1.6 cm2/m2 which makes no sense given the appearance of the valve. The TEE showed a mean gradient of 25 mm Hg which I think is falsely low and again the appearance of the valve is that of severe AS. The cath showed no significant CAD. The mean gradient was recorded at 14.9 mm Hg which again makes no sense and I think must be a measurement error. His symptoms are consistent with severe AS and the appearance of his valve is consistent with severe AS. I agree that AVR is indicated for relief of his symptoms and to prevent progressive LV deterioration and to allow HD to occur without worsening hemodynamic instability. His risk for open surgical AVR is at least moderately elevated as not by the STS PROM score but I think it would likely be higher. His cardiac CT shows significant calcification of his ascending aorta and he has severe cerebrovascular disease with occluded left ICA and vertebral arteries. I  think TAVR would be the best treatment for him. His gated cardiac CT shows anatomy suitable for TAVR using a Sapien 3 valve. The LM height is only 10 mm but is probably adequate. The abdominal and pelvic CT shows fairly severe calcified plaque in the aorto-iliac vessels although the lumen size appears large enough and the femoral vessels have minimal plaque. Transfemoral insertion should be possible.    The patient and his wife and daughter werecounseled at length regarding treatment alternatives for management of severe symptomatic aortic stenosis. The risks and benefits of surgical intervention has been discussed in detail. Long-term prognosis with medical therapy was discussed. Alternative approaches such as conventional surgical aortic valve replacement, transcatheter aortic valve replacement, and palliative medical therapy were compared and contrasted at length. This discussion was placed in the context of the patient's own specific clinical presentation and past medical history. All of their questions have been addressed. The patient is eager to proceed with surgical management as soon as possible before his wife has to start chemotherapy.  A discussion was held regarding what types of management strategies would be attempted intraoperatively in the event of life-threatening complications, including whether or not the patient would be considered a candidate for the use of cardiopulmonary bypass and/or conversion to open sternotomy for attempted surgical intervention.   The patient has been advised of a variety of complications that might develop including but not limited to risks of death, stroke, paravalvular leak, aortic dissection or other major vascular complications, aortic annulus rupture, device embolization, cardiac rupture or perforation, mitral regurgitation, acute myocardial infarction, arrhythmia, heart block or bradycardia requiring permanent pacemaker placement, congestive heart failure, respiratory failure, renal failure, pneumonia, infection, other late complications related to structural valve deterioration or migration, or other complications that might ultimately cause a temporary or permanent loss of functional independence or other long term morbidity. The patient provides full informed consent for the procedure as described and all questions were answered.    DETAILS  OF THE OPERATIVE PROCEDURE  PREPARATION:    The patient is brought to the operating room on the above mentioned date and central monitoring was established by the anesthesia team including placement of a central venous line and radial arterial line. The patient is placed in the supine position on the operating table.  Intravenous antibiotics are administered. General endotracheal anesthesia is induced uneventfully.  A Foley catheter is placed.  Baseline transesophageal echocardiogram was performed. The patient's chest, abdomen, both groins, and both lower extremities are prepared and draped in a sterile manner. A time out procedure is performed.   PERIPHERAL ACCESS:    Using the modified Seldinger technique, femoral arterial and venous access was obtained with placement of 6 Fr sheaths on the left side.  A pigtail diagnostic catheter was passed through the left arterial sheath under fluoroscopic guidance into the aortic root.  A temporary transvenous pacemaker catheter was passed through the left femoral venous sheath under fluoroscopic guidance into the right ventricle.  The pacemaker was tested to ensure stable lead placement and pacemaker capture. Aortic root angiography was performed in order to determine the optimal angiographic angle for valve deployment.   TRANSFEMORAL ACCESS:   Percutaneous transfemoral access and sheath placement was performed by Dr Angelena Form. Please see his separate operative note for details. The patient was heparinized systemically and ACT verified > 250 seconds.    A 16 Fr transfemoral E-sheath was introduced into the right femoral artery after progressively dilating over an Amplatz superstiff wire. An AL-2  catheter was used to direct a straight-tip exchange length wire across the native aortic valve into the left ventricle. This was exchanged out for a pigtail catheter and position was confirmed in the LV apex. Simultaneous LV and Ao pressures were recorded.  The  pigtail catheter was exchanged for an Amplatz Extra-stiff wire in the LV apex.  Echocardiography was utilized to confirm appropriate wire position and no sign of entanglement in the mitral subvalvular apparatus.   BALLOON AORTIC VALVULOPLASTY:   Not performed   TRANSCATHETER HEART VALVE DEPLOYMENT:   An Edwards Sapien 3 transcatheter heart valve (size 29 mm, model #[id number], serial #[id number]) was prepared and crimped per manufacturer's guidelines, and the proper orientation of the valve is confirmed on the [employer] delivery system. The valve was advanced through the introducer sheath using normal technique until in an appropriate position in the abdominal aorta beyond the sheath tip. The balloon was then retracted and using the fine-tuning wheel was centered on the valve. The valve was then advanced across the aortic arch using appropriate flexion of the catheter. The valve was carefully positioned across the aortic valve annulus. The Commander catheter was retracted using normal technique. Once final position of the valve has been confirmed by angiographic assessment, the valve is deployed while temporarily holding ventilation and during rapid ventricular pacing to maintain systolic blood pressure < 50 mmHg and pulse pressure < 10 mmHg. The balloon inflation is held for >3 seconds after reaching full deployment volume. Once the balloon has fully deflated the balloon is retracted into the ascending aorta and valve function is assessed using echocardiography. There is felt to be trivial paravalvular leak and no central aortic insufficiency.  The patient's hemodynamic recovery following valve deployment is good.  The deployment balloon and guidewire are both removed. Final intraoperative TEE demostrated acceptable post-procedural gradients, stable mitral valve function, trivial aortic insufficiency, and stable LV systolic function.   PROCEDURE COMPLETION:   The sheath was removed and femoral  artery closure performed by Dr Angelena Form. Please see his separate report for details.  Protamine was administered once femoral arterial repair was complete. The temporary pacemaker, pigtail catheters and femoral sheaths were removed with manual pressure used for hemostasis.   The patient tolerated the procedure well and is transported to the surgical intensive care in stable condition. There were no immediate intraoperative complications. All sponge instrument and needle counts are verified correct at completion of the operation.   No blood products were administered during the operation.  The patient received a total of 65.3 mL of intravenous contrast during the procedure.   Gaye Pollack, MD 08/31/2016

## 2016-08-31 NOTE — Anesthesia Procedure Notes (Signed)
Procedure Name: Intubation Date/Time: 08/31/2016 1:40 PM Performed by: Greggory Stallion, Nemesio Castrillon L Pre-anesthesia Checklist: Patient identified, Emergency Drugs available, Suction available and Patient being monitored Patient Re-evaluated:Patient Re-evaluated prior to inductionOxygen Delivery Method: Circle System Utilized Preoxygenation: Pre-oxygenation with 100% oxygen Intubation Type: IV induction Ventilation: Mask ventilation without difficulty Grade View: Grade I Tube type: Oral Tube size: 8.0 mm Number of attempts: 1 Airway Equipment and Method: Stylet and Oral airway Placement Confirmation: ETT inserted through vocal cords under direct vision,  positive ETCO2 and breath sounds checked- equal and bilateral Secured at: 22 cm Tube secured with: Tape Dental Injury: Teeth and Oropharynx as per pre-operative assessment

## 2016-09-01 ENCOUNTER — Other Ambulatory Visit (HOSPITAL_COMMUNITY): Payer: Medicare Other

## 2016-09-01 ENCOUNTER — Encounter (HOSPITAL_COMMUNITY): Payer: Self-pay | Admitting: Cardiovascular Disease

## 2016-09-01 DIAGNOSIS — I350 Nonrheumatic aortic (valve) stenosis: Secondary | ICD-10-CM

## 2016-09-01 DIAGNOSIS — Z954 Presence of other heart-valve replacement: Secondary | ICD-10-CM

## 2016-09-01 DIAGNOSIS — Z952 Presence of prosthetic heart valve: Secondary | ICD-10-CM

## 2016-09-01 DIAGNOSIS — I481 Persistent atrial fibrillation: Secondary | ICD-10-CM

## 2016-09-01 LAB — GLUCOSE, CAPILLARY
GLUCOSE-CAPILLARY: 97 mg/dL (ref 65–99)
GLUCOSE-CAPILLARY: 131 mg/dL — AB (ref 65–99)
Glucose-Capillary: 118 mg/dL — ABNORMAL HIGH (ref 65–99)
Glucose-Capillary: 90 mg/dL (ref 65–99)

## 2016-09-01 LAB — BASIC METABOLIC PANEL
ANION GAP: 10 (ref 5–15)
BUN: 23 mg/dL — ABNORMAL HIGH (ref 6–20)
CHLORIDE: 102 mmol/L (ref 101–111)
CO2: 24 mmol/L (ref 22–32)
Calcium: 7.8 mg/dL — ABNORMAL LOW (ref 8.9–10.3)
Creatinine, Ser: 6.43 mg/dL — ABNORMAL HIGH (ref 0.61–1.24)
GFR calc Af Amer: 9 mL/min — ABNORMAL LOW (ref >60)
GFR calc non Af Amer: 8 mL/min — ABNORMAL LOW (ref >60)
GLUCOSE: 121 mg/dL — AB (ref 65–99)
POTASSIUM: 4 mmol/L (ref 3.5–5.1)
Sodium: 136 mmol/L (ref 135–145)

## 2016-09-01 LAB — CBC
HEMATOCRIT: 28.1 % — AB (ref 39.0–52.0)
HEMOGLOBIN: 9.1 g/dL — AB (ref 13.0–17.0)
MCH: 32.9 pg (ref 26.0–34.0)
MCHC: 32.4 g/dL (ref 30.0–36.0)
MCV: 101.4 fL — AB (ref 78.0–100.0)
Platelets: 140 10*3/uL — ABNORMAL LOW (ref 150–400)
RBC: 2.77 MIL/uL — ABNORMAL LOW (ref 4.22–5.81)
RDW: 16.2 % — ABNORMAL HIGH (ref 11.5–15.5)
WBC: 7.7 10*3/uL (ref 4.0–10.5)

## 2016-09-01 LAB — MAGNESIUM: Magnesium: 1.6 mg/dL — ABNORMAL LOW (ref 1.7–2.4)

## 2016-09-01 LAB — HEMOGLOBIN A1C
Hgb A1c MFr Bld: 5.7 % — ABNORMAL HIGH (ref 4.8–5.6)
Mean Plasma Glucose: 117 mg/dL

## 2016-09-01 MED ORDER — LEVOFLOXACIN IN D5W 750 MG/150ML IV SOLN
750.0000 mg | Freq: Once | INTRAVENOUS | Status: AC
  Administered 2016-09-01: 750 mg via INTRAVENOUS
  Filled 2016-09-01 (×2): qty 150

## 2016-09-01 MED ORDER — PANTOPRAZOLE SODIUM 40 MG PO TBEC
40.0000 mg | DELAYED_RELEASE_TABLET | Freq: Every day | ORAL | Status: DC
  Administered 2016-09-02: 40 mg via ORAL
  Filled 2016-09-01: qty 1

## 2016-09-01 MED ORDER — CINACALCET HCL 30 MG PO TABS
30.0000 mg | ORAL_TABLET | ORAL | Status: DC
  Administered 2016-09-01: 30 mg via ORAL
  Filled 2016-09-01: qty 1

## 2016-09-01 MED ORDER — ACETAMINOPHEN 325 MG PO TABS
650.0000 mg | ORAL_TABLET | Freq: Four times a day (QID) | ORAL | Status: DC | PRN
  Administered 2016-09-01: 650 mg via ORAL
  Filled 2016-09-01: qty 2

## 2016-09-01 MED ORDER — METOPROLOL TARTRATE 25 MG PO TABS
25.0000 mg | ORAL_TABLET | Freq: Two times a day (BID) | ORAL | Status: DC
  Administered 2016-09-01 – 2016-09-02 (×3): 25 mg via ORAL
  Filled 2016-09-01 (×3): qty 1

## 2016-09-01 MED ORDER — INSULIN ASPART 100 UNIT/ML ~~LOC~~ SOLN: 0.0000 [IU] | Freq: Three times a day (TID) | SUBCUTANEOUS | Status: DC

## 2016-09-01 MED ORDER — MOVING RIGHT ALONG BOOK
Freq: Once | Status: DC
Start: 2016-09-01 — End: 2016-09-02
  Filled 2016-09-01: qty 1

## 2016-09-01 MED ORDER — SODIUM CHLORIDE 0.9 % IV SOLN: 250.0000 mL | INTRAVENOUS | Status: DC | PRN

## 2016-09-01 MED ORDER — SODIUM CHLORIDE 0.9% FLUSH: 3.0000 mL | INTRAVENOUS | Status: DC | PRN

## 2016-09-01 MED ORDER — ONDANSETRON HCL 4 MG PO TABS: 4.0000 mg | ORAL_TABLET | Freq: Four times a day (QID) | ORAL | Status: DC | PRN

## 2016-09-01 MED ORDER — ASPIRIN EC 81 MG PO TBEC
81.0000 mg | DELAYED_RELEASE_TABLET | Freq: Every day | ORAL | Status: DC
  Administered 2016-09-01 – 2016-09-02 (×2): 81 mg via ORAL
  Filled 2016-09-01 (×2): qty 1

## 2016-09-01 MED ORDER — WARFARIN SODIUM 5 MG PO TABS
5.0000 mg | ORAL_TABLET | Freq: Every day | ORAL | Status: AC
  Administered 2016-09-01: 5 mg via ORAL
  Filled 2016-09-01: qty 1

## 2016-09-01 MED ORDER — WARFARIN - PHYSICIAN DOSING INPATIENT: Freq: Every day | Status: DC

## 2016-09-01 MED ORDER — ONDANSETRON HCL 4 MG/2ML IJ SOLN: 4.0000 mg | Freq: Four times a day (QID) | INTRAMUSCULAR | Status: DC | PRN

## 2016-09-01 MED ORDER — SODIUM CHLORIDE 0.9% FLUSH
3.0000 mL | Freq: Two times a day (BID) | INTRAVENOUS | Status: DC
Start: 2016-09-01 — End: 2016-09-02

## 2016-09-01 MED FILL — Insulin Regular (Human) Inj 100 Unit/ML: INTRAMUSCULAR | Qty: 1 | Status: AC

## 2016-09-01 MED FILL — Heparin Sodium (Porcine) Inj 1000 Unit/ML: INTRAMUSCULAR | Qty: 30 | Status: AC

## 2016-09-01 MED FILL — Potassium Chloride Inj 2 mEq/ML: INTRAVENOUS | Qty: 40 | Status: AC

## 2016-09-01 MED FILL — Sodium Chloride IV Soln 0.9%: INTRAVENOUS | Qty: 100 | Status: AC

## 2016-09-01 MED FILL — Magnesium Sulfate Inj 50%: INTRAMUSCULAR | Qty: 10 | Status: AC

## 2016-09-01 NOTE — Consult Note (Signed)
Alexander [name] Admit Date: 08/31/2016 09/01/2016 Alexander [name] Requesting Physician:  Cyndia Bent MD  Reason for Consult:  Comanagement of ESRD HPI:  76 year old male seen for comanagement of ESRD status post transcatheter aortic valve replacement  PMH Incudes:  Severe aortic stenosis status post TAVR 08/31/16, uneventful.  Permanent atrial fibrillation with history of stroke, on warfarin  History of bradycardia status post PPM 2016  DM 2;   Hypertension  CAD   Yesterday's procedure uneventful. Today is his dialysis day. Last hemodialysis was on 6/4 which was full in duration, within 0.2 kg of EDW. Last hemoglobin greater than 10.  He has had some difficulty with intradialytic hypotension, potentially connected with his aortic stenosis.  Today he states he artery feels better and can notice a difference.   Outpt HD Orders Unit: [place] Days: MWF Time: 4h Dialyzer: F160 EDW: 94kg K/Ca: 2K 2.5Ca Access: AVF Needle Size: 15g BFR/DFR: 450 / A1.5 UF Proflie: none VDRA: none currently EPO: Mircera 13mcg q4wk last dose 5/16 IV Fe: not current Heparin: 2800 units IVB Treatment Adherence: excellent   Creat (mg/dL)  Date Value  01/02/2016 3.61 (H)  12/09/2015 5.67 (H)  03/06/2015 4.69 (H)  07/05/2014 3.29 (H)   Creatinine, Ser (mg/dL)  Date Value  09/01/2016 6.43 (H)  08/31/2016 6.10 (H)  08/31/2016 5.88 (H)  08/19/2016 5.21 (H)  10/04/2015 6.85 (H)  03/12/2015 7.01 (H)  01/15/2015 3.93 (H)  11/18/2014 4.37 (H)  11/12/2014 7.49 (H)  04/06/2014 5.60 (H)  ] I/Os:  ROS Balance of 12 systems is negative w/ exceptions as above  PMH  Past Medical History:  Diagnosis Date  . Anemia   . Aortic stenosis 06/15/12   TEE - EF 36-64%; grade 1 diastolic dysfunction; mild/mod aortic valve stenosis; Mitral valve had calcified annulus, mild pulm htn PA peak pressure 76mmHg  . Barrett's esophagus 05/2003  . Bradycardia 2017   [employer] 2240 Assurity dual-lead pacemaker   . Carpal tunnel syndrome, bilateral 11/03/2015  . Colon polyps   . CVA (cerebral infarction)    2004/affected left side  . Depression   . Diabetes mellitus without complication ([place])   . Diabetic peripheral neuropathy ([place]) 10/02/2015  . Diverticulosis   . Dyspnea    with exertion  . End stage renal disease ([place])    hemodialysis 3 times a week  . GERD (gastroesophageal reflux disease)   . Glaucoma   . Hyperlipidemia   . Hypertension   . Kidney stones   . Legally blind   . Macular degeneration    both eyes  . Orthostatic hypotension 09/09/2015  . Paroxysmal atrial fibrillation (HCC)   . Peptic ulcer    bleeding, 1969  . Presence of permanent cardiac pacemaker   . S/P epidural steroid injection    last  injection over 10 years ago  . Seasonal allergies   . Tubular adenoma of colon 07/2001   PSH  Past Surgical History:  Procedure Laterality Date  . AV FISTULA PLACEMENT  2009  . BACK SURGERY    . CARDIOVERSION N/A 11/13/2015   Procedure: CARDIOVERSION;  Surgeon: Troy Sine, MD;  Location: [facility] ENDOSCOPY;  Service: Cardiovascular;  Laterality: N/A;  . CARDIOVERSION N/A 01/13/2016   Procedure: CARDIOVERSION;  Surgeon: Will Meredith [name], MD;  Location: [place];  Service: Cardiovascular;  Laterality: N/A;  . [place]   right eye  . CYSTOSCOPY  several times   kidney stones  . EP IMPLANTABLE DEVICE N/A 03/11/2015  Procedure: Pacemaker Implant;  Surgeon: Will Meredith [name], MD;  [place];  Laterality: Left  . LAMINECTOMY  1969  . RIGHT/LEFT HEART CATH AND CORONARY ANGIOGRAPHY N/A 08/20/2016   Procedure: Right/Left Heart Cath and Coronary Angiography;  Surgeon: Burnell Blanks, MD;  Location: [place] CV LAB;  Service: Cardiovascular;  Laterality: N/A;  . TEE WITHOUT CARDIOVERSION N/A 07/22/2016   Procedure: TRANSESOPHAGEAL ECHOCARDIOGRAM (TEE);  Surgeon: Skeet Latch, MD;  Location: [facility] ENDOSCOPY;  Service: Cardiovascular;   Laterality: N/A;  . TONSILLECTOMY  1964   FH  Family History  Problem Relation Age of Onset  . Stomach cancer Mother   . Hypertension Father        Died of heart attack  . Heart attack Father   . Stroke Sister   . Heart disease Sister   . Cancer Brother   . Colon cancer Neg Hx    SH  reports that he quit smoking about 18 years ago. His smoking use included Cigarettes. He has a 90.00 pack-year smoking history. He has never used smokeless tobacco. He reports that he does not drink alcohol or use drugs. Allergies  Allergies  Allergen Reactions  . Penicillins Swelling    SWELLING REACTION UNSPECIFIED   PATIENT HAD A PCN REACTION WITH IMMEDIATE RASH, FACIAL/TONGUE/THROAT SWELLING, SOB, OR LIGHTHEADEDNESS WITH HYPOTENSION:  #  #  #  YES  #  #  #   Has patient had a PCN reaction causing severe rash involving mucus membranes or skin necrosis: No Has patient had a PCN reaction that required hospitalization No Has patient had a PCN reaction occurring within the last 10 years: No If all of the above answers are "NO", then may proceed with Cephalosporin use.  . Codeine Nausea Only  . Tramadol Nausea Only   Home medications Prior to Admission medications   Medication Sig Start Date End Date Taking? Authorizing Provider  atorvastatin (LIPITOR) 40 MG tablet Take 20 mg by mouth daily.   Yes [provider]  b complex-vitamin c-folic acid (NEPHRO-VITE) 0.8 MG TABS Take 1 tablet by mouth daily.    Yes [provider]  bromocriptine (PARLODEL) 5 MG capsule Take 5 mg by mouth at bedtime.  11/30/10  Yes [provider]  calcium acetate (PHOSLO) 667 MG capsule Take 667 mg by mouth 4 (four) times daily.    Yes [provider]  cetirizine (ZYRTEC) 10 MG tablet Take 10 mg by mouth at bedtime.    Yes [provider]  dorzolamide-timolol (COSOPT) 22.3-6.8 MG/ML ophthalmic solution Place 1 drop into the right eye 2 (two) times daily. 08/13/13  Yes [provider]  enoxaparin (LOVENOX) 80 MG/0.8ML injection Inject 0.8 mLs (80 mg total) into the skin daily. 08/27/16  Yes Croitoru, Mihai, MD  FLUoxetine (PROZAC) 20 MG capsule Take 40 mg by mouth daily.  11/30/10  Yes [provider]  metoprolol tartrate (LOPRESSOR) 25 MG tablet TAKE 1 TABLET BY MOUTH TWICE DAILY 02/05/16  Yes Camnitz, Ocie Doyne, MD  midodrine (PROAMATINE) 5 MG tablet Take 15 mg by mouth 3 (three) times daily as needed (for systolic blood pressure less than 130).    Yes [provider]  midodrine (PROAMATINE) 5 MG tablet TAKE 3 TABLETS BY MOUTH THREE TIMES DAILY AS NEEDED. 08/25/16  Yes Skeet Latch, MD  omeprazole (PRILOSEC) 20 MG capsule Take 20 mg by mouth daily.   Yes [provider]  predniSONE (DELTASONE) 5 MG tablet Take 5 mg by  mouth daily.  04/27/16  Yes [provider]  SENSIPAR 30 MG tablet Take 30 mg by mouth at bedtime.  03/04/16  Yes [provider]  warfarin (COUMADIN) 5 MG tablet Take 1-1.5 tablets by mouth daily as directed by coumadin clinic 03/08/16  Yes Skeet Latch, MD    Current Medications Scheduled Meds: . acetaminophen  1,000 mg Oral Q6H   Or  . acetaminophen (TYLENOL) oral liquid 160 mg/5 mL  1,000 mg Per Tube Q6H  . acetaminophen (TYLENOL) oral liquid 160 mg/5 mL  650 mg Per Tube Once   Or  . acetaminophen  650 mg Rectal Once  . aspirin EC  325 mg Oral Daily   Or  . aspirin  324 mg Per Tube Daily  . atorvastatin  20 mg Oral q1800  . bromocriptine  5 mg Oral QHS  . calcium acetate  667 mg Oral TID WC & HS  . Chlorhexidine Gluconate Cloth  6 each Topical Daily  . cinacalcet  30 mg Oral Q supper  . dorzolamide-timolol  1 drop Right Eye BID  . FLUoxetine  40 mg Oral Daily  . insulin aspart  0-24 Units Subcutaneous Q4H  . insulin regular  0-10 Units Intravenous TID WC  . loratadine  10 mg Oral Daily  . metoprolol tartrate  12.5 mg Oral BID   Or  . metoprolol tartrate  12.5 mg Per Tube BID  .  multivitamin  1 tablet Oral QHS  . mupirocin ointment  1 application Nasal BID  . [START ON 09/02/2016] pantoprazole  40 mg Oral Daily  . predniSONE  5 mg Oral Daily  . sodium chloride flush  3 mL Intravenous Q12H  . sodium chloride flush  3 mL Intravenous Q12H   Continuous Infusions: . sodium chloride    . sodium chloride    . albumin human    . famotidine (PEPCID) IV 20 mg (08/31/16 1727)  . insulin (NOVOLIN-R) infusion    . levofloxacin (LEVAQUIN) IV    . nitroGLYCERIN    . phenylephrine (NEO-SYNEPHRINE) Adult infusion    . potassium chloride 10 mEq (08/31/16 1727)   PRN Meds:.sodium chloride, sodium chloride, albumin human, fentaNYL (SUBLIMAZE) injection, metoprolol tartrate, ondansetron (ZOFRAN) IV, potassium chloride, sodium chloride flush, sodium chloride flush  CBC  Recent Labs Lab 08/31/16 1207  08/31/16 1630 08/31/16 1633 09/01/16 0311  WBC 6.8  --  6.2  --  7.7  HGB 9.6*  < > 8.5* 8.5* 9.1*  HCT 30.6*  < > 26.3* 25.0* 28.1*  MCV 102.3*  --  101.5*  --  101.4*  PLT 176  --  126*  --  140*  < > = values in this interval not displayed. Basic Metabolic Panel  Recent Labs Lab 08/31/16 1207 08/31/16 1519 08/31/16 1603 08/31/16 1633 09/01/16 0311  NA 138 138 140 141 136  K 3.4* 3.3* 3.1* 3.1* 4.0  CL 101 101  --   --  102  CO2 24  --   --   --  24  GLUCOSE 85 125* 111* 103* 121*  BUN 20 21*  --   --  23*  CREATININE 5.88* 6.10*  --   --  6.43*  CALCIUM 8.1*  --   --   --  7.8*    Physical Exam  Blood pressure 124/65, pulse (!) 106, temperature 97.6 F (36.4 C), temperature source Oral, resp. rate (!) 21, weight 96.8 kg (213 lb 6.5 oz), SpO2 100 %. GEN: No acute  distress, sitting in chair, conversant ENT: NCAT EYES: Blind, CV: RRR, normal S1 and S2 PULM: Clear bilaterally ABD: Soft, nontender SKIN: No rashes or lesions EXT: AV fistula with bruit and thrill, trace edema   Assessment 2M ESRD MWF [place] s/p TAVF 08/31/16  1. ESRD via AVF MWF 2. Severe  AS s/p TAVF 6/5 3. pAFib on warfarin 4. 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 DM2 6. HTN  Plan 1. HD today, no heparin, 3K bath, target to EDW 2. Change to QOD cinacalcet; outpatient dosing   Pearson Grippe MD [phone] pgr 09/01/2016, 8:38 AM

## 2016-09-01 NOTE — Care Management Note (Signed)
Case Management Note Marvetta Gibbons RN, BSN Unit 2W-Case Manager-- [place] coverage [phone]  Patient Details  Name: Alexander [name] MRN: [id number] Date of Birth: 02-Feb-1941  Subjective/Objective:  Pt admitted s/p TAVR 08/31/16                 Action/Plan: PTA pt lived at home with wife- anticipate return home- hx ESRD- HD- CM to follow for d/c needs  Expected Discharge Date:                  Expected Discharge Plan:  Home/Self Care  In-House Referral:     Discharge planning Services  CM Consult  Post Acute Care Choice:    Choice offered to:     DME Arranged:    DME Agency:     HH Arranged:    [place] Agency:     Status of Service:  In process, will continue to follow  If discussed at Long Length of Stay Meetings, dates discussed:    Discharge Disposition:   Additional Comments:  Dawayne Patricia, RN 09/01/2016, 11:07 AM

## 2016-09-01 NOTE — Procedures (Signed)
I was present at this dialysis session. I have reviewed the session itself and made appropriate changes.   Pt tolerating well.  Filed Weights   09/01/16 0500  Weight: 96.8 kg (213 lb 6.5 oz)     Recent Labs Lab 09/01/16 0311  NA 136  K 4.0  CL 102  CO2 24  GLUCOSE 121*  BUN 23*  CREATININE 6.43*  CALCIUM 7.8*     Recent Labs Lab 08/31/16 1207  08/31/16 1630 08/31/16 1633 09/01/16 0311  WBC 6.8  --  6.2  --  7.7  HGB 9.6*  < > 8.5* 8.5* 9.1*  HCT 30.6*  < > 26.3* 25.0* 28.1*  MCV 102.3*  --  101.5*  --  101.4*  PLT 176  --  126*  --  140*  < > = values in this interval not displayed.  Scheduled Meds: . acetaminophen  1,000 mg Oral Q6H   Or  . acetaminophen (TYLENOL) oral liquid 160 mg/5 mL  1,000 mg Per Tube Q6H  . acetaminophen (TYLENOL) oral liquid 160 mg/5 mL  650 mg Per Tube Once   Or  . acetaminophen  650 mg Rectal Once  . aspirin EC  325 mg Oral Daily   Or  . aspirin  324 mg Per Tube Daily  . atorvastatin  20 mg Oral q1800  . bromocriptine  5 mg Oral QHS  . calcium acetate  667 mg Oral TID WC & HS  . Chlorhexidine Gluconate Cloth  6 each Topical Daily  . cinacalcet  30 mg Oral QODAY  . dorzolamide-timolol  1 drop Right Eye BID  . FLUoxetine  40 mg Oral Daily  . insulin aspart  0-24 Units Subcutaneous Q4H  . insulin regular  0-10 Units Intravenous TID WC  . loratadine  10 mg Oral Daily  . metoprolol tartrate  12.5 mg Oral BID   Or  . metoprolol tartrate  12.5 mg Per Tube BID  . multivitamin  1 tablet Oral QHS  . mupirocin ointment  1 application Nasal BID  . [START ON 09/02/2016] pantoprazole  40 mg Oral Daily  . predniSONE  5 mg Oral Daily  . sodium chloride flush  3 mL Intravenous Q12H  . sodium chloride flush  3 mL Intravenous Q12H   Continuous Infusions: . sodium chloride    . sodium chloride    . albumin human    . famotidine (PEPCID) IV 20 mg (08/31/16 1727)  . insulin (NOVOLIN-R) infusion    . levofloxacin (LEVAQUIN) IV    .  nitroGLYCERIN    . phenylephrine (NEO-SYNEPHRINE) Adult infusion    . potassium chloride 10 mEq (08/31/16 1727)   PRN Meds:.sodium chloride, sodium chloride, albumin human, metoprolol tartrate, ondansetron (ZOFRAN) IV, potassium chloride, sodium chloride flush, sodium chloride flush   Alexander Grippe  MD 09/01/2016, 10:31 AM

## 2016-09-01 NOTE — Progress Notes (Signed)
1 Day Post-Op Procedure(s) (LRB): TRANSCATHETER AORTIC VALVE REPLACEMENT, TRANSFEMORAL (N/A) TRANSESOPHAGEAL ECHOCARDIOGRAM (TEE) (N/A) Subjective:  No complaints. No chest pain or shortness of breath.  Ambulated well this am.  Objective: Vital signs in last 24 hours: Temp:  [97.1 F (36.2 C)-98.6 F (37 C)] 97.6 F (36.4 C) (06/06 0801) Pulse Rate:  [38-122] 106 (06/06 0730) Cardiac Rhythm: Atrial fibrillation (06/06 0600) Resp:  [11-28] 21 (06/06 0730) BP: (76-139)/(55-84) 124/65 (06/06 0730) SpO2:  [87 %-100 %] 100 % (06/06 0730) Arterial Line BP: (120-175)/(55-80) 159/74 (06/06 0730) Weight:  [96.8 kg (213 lb 6.5 oz)] 96.8 kg (213 lb 6.5 oz) (06/06 0500)  Hemodynamic parameters for last 24 hours:    Intake/Output from previous day: 06/05 0701 - 06/06 0700 In: 1550 [I.V.:1000; IV Piggyback:550] Out: 225 [Urine:75; Blood:150] Intake/Output this shift: No intake/output data recorded.  General appearance: alert and cooperative Neurologic: intact Heart: irregular rate and rhythm, S1, S2 normal, no murmur, click, rub or gallop Lungs: clear to auscultation bilaterally Extremities: edema mild Wound: groin access sites ok  Lab Results:  Recent Labs  08/31/16 1630 08/31/16 1633 09/01/16 0311  WBC 6.2  --  7.7  HGB 8.5* 8.5* 9.1*  HCT 26.3* 25.0* 28.1*  PLT 126*  --  140*   BMET:  Recent Labs  08/31/16 1207 08/31/16 1519  08/31/16 1633 09/01/16 0311  NA 138 138  < > 141 136  K 3.4* 3.3*  < > 3.1* 4.0  CL 101 101  --   --  102  CO2 24  --   --   --  24  GLUCOSE 85 125*  < > 103* 121*  BUN 20 21*  --   --  23*  CREATININE 5.88* 6.10*  --   --  6.43*  CALCIUM 8.1*  --   --   --  7.8*  < > = values in this interval not displayed.  PT/INR:  Recent Labs  08/31/16 1630  LABPROT 16.4*  INR 1.32   ABG    Component Value Date/Time   PHART 7.415 08/31/2016 1607   HCO3 24.7 08/31/2016 1607   TCO2 26 08/31/2016 1607   O2SAT 98.0 08/31/2016 1607   CBG  (last 3)   Recent Labs  08/31/16 2306 09/01/16 0310 09/01/16 0809  GLUCAP 114* 118* 97   ECG: atrial fib 80's, no acute changes  Assessment/Plan: S/P Procedure(s) (LRB): TRANSCATHETER AORTIC VALVE REPLACEMENT, TRANSFEMORAL (N/A) TRANSESOPHAGEAL ECHOCARDIOGRAM (TEE) (N/A)  He has been hemodynamically stable  ESRD on HD: nephrology consulted. He is a pt of Dr. Lorrene Reid.  DM: glucose under good control  PAF: resume Coumadin today. Continue ASA  2D echo today.  Will transfer to 2W.     LOS: 1 day    Gaye Pollack 09/01/2016

## 2016-09-01 NOTE — Progress Notes (Signed)
Progress Note  Patient Name: Alexander [name] Date of Encounter: 09/01/2016  Primary Cardiologist: [place] is much better. NO pain  Inpatient Medications    Scheduled Meds: . acetaminophen  1,000 mg Oral Q6H   Or  . acetaminophen (TYLENOL) oral liquid 160 mg/5 mL  1,000 mg Per Tube Q6H  . acetaminophen (TYLENOL) oral liquid 160 mg/5 mL  650 mg Per Tube Once   Or  . acetaminophen  650 mg Rectal Once  . aspirin EC  325 mg Oral Daily   Or  . aspirin  324 mg Per Tube Daily  . atorvastatin  20 mg Oral q1800  . bromocriptine  5 mg Oral QHS  . calcium acetate  667 mg Oral TID WC & HS  . Chlorhexidine Gluconate Cloth  6 each Topical Daily  . cinacalcet  30 mg Oral Q supper  . dorzolamide-timolol  1 drop Right Eye BID  . FLUoxetine  40 mg Oral Daily  . insulin aspart  0-24 Units Subcutaneous Q4H  . insulin regular  0-10 Units Intravenous TID WC  . loratadine  10 mg Oral Daily  . metoprolol tartrate  12.5 mg Oral BID   Or  . metoprolol tartrate  12.5 mg Per Tube BID  . multivitamin  1 tablet Oral QHS  . mupirocin ointment  1 application Nasal BID  . [START ON 09/02/2016] pantoprazole  40 mg Oral Daily  . predniSONE  5 mg Oral Daily  . sodium chloride flush  3 mL Intravenous Q12H  . sodium chloride flush  3 mL Intravenous Q12H   Continuous Infusions: . sodium chloride    . sodium chloride    . albumin human    . famotidine (PEPCID) IV 20 mg (08/31/16 1727)  . insulin (NOVOLIN-R) infusion    . levofloxacin (LEVAQUIN) IV    . nitroGLYCERIN    . phenylephrine (NEO-SYNEPHRINE) Adult infusion    . potassium chloride 10 mEq (08/31/16 1727)   PRN Meds: sodium chloride, sodium chloride, albumin human, fentaNYL (SUBLIMAZE) injection, metoprolol tartrate, ondansetron (ZOFRAN) IV, potassium chloride, sodium chloride flush, sodium chloride flush   Vital Signs    Vitals:   09/01/16 0500 09/01/16 0530 09/01/16 0600 09/01/16 0630  BP: 124/81  129/74 117/61    Pulse: (!) 107 (!) 116  92  Resp:   17 19  Temp:      TempSrc:      SpO2: 100% 96% 95% 98%  Weight: 213 lb 6.5 oz (96.8 kg)       Intake/Output Summary (Last 24 hours) at 09/01/16 0641 Last data filed at 09/01/16 0600  Gross per 24 hour  Intake             1550 ml  Output              225 ml  Net             1325 ml   Filed Weights   09/01/16 0500  Weight: 213 lb 6.5 oz (96.8 kg)    Telemetry    Atrial fib - Personally Reviewed  ECG    Atrial fib, LBBB - Personally Reviewed  Physical Exam    General: Well developed, well nourished, NAD  HEENT: OP clear, mucus membranes moist  SKIN: warm, dry. No rashes. Neuro: No focal deficits  Musculoskeletal: Muscle strength 5/5 all ext  Psychiatric: Mood and affect normal  Neck: No JVD, no carotid bruits, no thyromegaly, no lymphadenopathy.  Lungs:Clear bilaterally, no wheezes, rhonci, crackles Cardiovascular: Irreg irreg. No murmurs, gallops or rubs. Abdomen:Soft. Bowel sounds present. Non-tender.  Extremities: No lower extremity edema. Pulses are 2 + in the bilateral DP/PT. Bilateral groins without hematoma    Labs    Chemistry Recent Labs Lab 08/31/16 1207 08/31/16 1519 08/31/16 1603 08/31/16 1633 09/01/16 0311  NA 138 138 140 141 136  K 3.4* 3.3* 3.1* 3.1* 4.0  CL 101 101  --   --  102  CO2 24  --   --   --  24  GLUCOSE 85 125* 111* 103* 121*  BUN 20 21*  --   --  23*  CREATININE 5.88* 6.10*  --   --  6.43*  CALCIUM 8.1*  --   --   --  7.8*  PROT 5.9*  --   --   --   --   ALBUMIN 3.3*  --   --   --   --   AST 20  --   --   --   --   ALT 24  --   --   --   --   ALKPHOS 61  --   --   --   --   BILITOT 1.0  --   --   --   --   GFRNONAA 8*  --   --   --  8*  GFRAA 10*  --   --   --  9*  ANIONGAP 13  --   --   --  10     Hematology Recent Labs Lab 08/31/16 1207  08/31/16 1630 08/31/16 1633 09/01/16 0311  WBC 6.8  --  6.2  --  7.7  RBC 2.99*  --  2.59*  --  2.77*  HGB 9.6*  < > 8.5* 8.5* 9.1*   HCT 30.6*  < > 26.3* 25.0* 28.1*  MCV 102.3*  --  101.5*  --  101.4*  MCH 32.1  --  32.8  --  32.9  MCHC 31.4  --  32.3  --  32.4  RDW 16.5*  --  16.2*  --  16.2*  PLT 176  --  126*  --  140*  < > = values in this interval not displayed.    Radiology    Dg Chest 2 View  Result Date: 08/31/2016 CLINICAL DATA:  Pre operative respiratory exam. Aortic valve disease. EXAM: CHEST  2 VIEW COMPARISON:  Chest x-ray dated 01/17/2016 FINDINGS: Heart size and pulmonary vascularity are normal. Pacemaker in place. There are no infiltrates or effusions. No bone abnormality. IMPRESSION: No active cardiopulmonary disease. Electronically Signed   By: Lorriane Shire M.D.   On: 08/31/2016 12:19   Dg Chest Port 1 View  Result Date: 08/31/2016 CLINICAL DATA:  Status post transcatheter aortic valve replacement. EXAM: PORTABLE CHEST 1 VIEW COMPARISON:  Radiograph of same day. FINDINGS: Stable cardiomediastinal silhouette. Right-sided pacemaker is unchanged in position. Atherosclerosis thoracic aorta is noted. No pneumothorax or pleural effusion is noted. Interval placement of right internal jugular catheter with distal tip in expected position of SVC. Bony thorax is unremarkable. Aortic valve replacement is noted. IMPRESSION: No pneumothorax is noted. Status post transcatheter aortic valve replacement. Aortic atherosclerosis. Electronically Signed   By: Marijo Conception, M.D.   On: 08/31/2016 16:29    Cardiac Studies     Patient Profile     76 y.o. male with atrial fib, ESRD on HD, HTN, HLD, severe AS s/p TAR  on 08/31/16  Assessment & Plan    1. Severe aortic valve stenosis: He is one day post TAVR. He is doing well. Will continue ASA. Restart coumadin today. Echo today to assess valve function.   2. Atrial fibrillation, persistent: Rate controlled. Resume coumadin.   3. ESRD: He will HD today  Signed, Lauree Chandler, MD  09/01/2016, 6:41 AM

## 2016-09-01 NOTE — Plan of Care (Signed)
Problem: Activity: Goal: Ability to return to normal activity level will improve Outcome: Progressing Pt ambulated 753ft with assist, tolerated walk well. Continue to encourage ambulation and maintain safety.

## 2016-09-02 ENCOUNTER — Encounter (HOSPITAL_COMMUNITY): Payer: Self-pay | Admitting: General Practice

## 2016-09-02 ENCOUNTER — Inpatient Hospital Stay (HOSPITAL_COMMUNITY): Payer: Medicare Other

## 2016-09-02 DIAGNOSIS — I350 Nonrheumatic aortic (valve) stenosis: Secondary | ICD-10-CM

## 2016-09-02 LAB — ECHOCARDIOGRAM LIMITED
AV Area mean vel: 2.91 cm2
AV VEL mean LVOT/AV: 0.77
AV area mean vel ind: 1.37 cm2/m2
AV peak Index: 1.48
AV pk vel: 194 cm/s
AV vel: 2.93
AVA: 2.93 cm2
AVAREAVTI: 3.15 cm2
AVAREAVTIIND: 1.38 cm2/m2
AVG: 7 mmHg
AVPG: 15 mmHg
Ao pk vel: 0.83 m/s
DOP CAL AO MEAN VELOCITY: 123 cm/s
HEIGHTINCHES: 70.5 in
LDCA: 3.8 cm2
LVOT SV: 100 mL
LVOT VTI: 26.3 cm
LVOT peak VTI: 0.77 cm
LVOT peak grad rest: 10 mmHg
LVOT peak vel: 161 cm/s
LVOTD: 22 mm
VTI: 34.1 cm
Valve area index: 1.38
WEIGHTICAEL: 3304 [oz_av]

## 2016-09-02 LAB — CBC
HCT: 29.6 % — ABNORMAL LOW (ref 39.0–52.0)
Hemoglobin: 9.5 g/dL — ABNORMAL LOW (ref 13.0–17.0)
MCH: 33.2 pg (ref 26.0–34.0)
MCHC: 32.1 g/dL (ref 30.0–36.0)
MCV: 103.5 fL — AB (ref 78.0–100.0)
PLATELETS: 145 10*3/uL — AB (ref 150–400)
RBC: 2.86 MIL/uL — AB (ref 4.22–5.81)
RDW: 16.5 % — ABNORMAL HIGH (ref 11.5–15.5)
WBC: 8 10*3/uL (ref 4.0–10.5)

## 2016-09-02 LAB — BASIC METABOLIC PANEL
Anion gap: 12 (ref 5–15)
BUN: 16 mg/dL (ref 6–20)
CHLORIDE: 97 mmol/L — AB (ref 101–111)
CO2: 26 mmol/L (ref 22–32)
Calcium: 8.2 mg/dL — ABNORMAL LOW (ref 8.9–10.3)
Creatinine, Ser: 4.78 mg/dL — ABNORMAL HIGH (ref 0.61–1.24)
GFR calc Af Amer: 13 mL/min — ABNORMAL LOW (ref >60)
GFR, EST NON AFRICAN AMERICAN: 11 mL/min — AB (ref >60)
GLUCOSE: 99 mg/dL (ref 65–99)
POTASSIUM: 3.9 mmol/L (ref 3.5–5.1)
Sodium: 135 mmol/L (ref 135–145)

## 2016-09-02 LAB — GLUCOSE, CAPILLARY
GLUCOSE-CAPILLARY: 102 mg/dL — AB (ref 65–99)
Glucose-Capillary: 109 mg/dL — ABNORMAL HIGH (ref 65–99)

## 2016-09-02 LAB — PROTIME-INR
INR: 1.18
Prothrombin Time: 15.1 seconds (ref 11.4–15.2)

## 2016-09-02 MED ORDER — WARFARIN SODIUM 5 MG PO TABS: 5.0000 mg | ORAL_TABLET | Freq: Once | ORAL | Status: DC

## 2016-09-02 MED ORDER — MUPIROCIN 2 % EX OINT: 1.0000 "application " | TOPICAL_OINTMENT | Freq: Two times a day (BID) | CUTANEOUS | 0 refills | Status: DC

## 2016-09-02 MED ORDER — ASPIRIN 81 MG PO TBEC: 81.0000 mg | DELAYED_RELEASE_TABLET | Freq: Every day | ORAL | Status: DC

## 2016-09-02 MED ORDER — SENSIPAR 30 MG PO TABS: 30.0000 mg | ORAL_TABLET | ORAL | 5 refills | Status: DC

## 2016-09-02 MED ORDER — ACETAMINOPHEN 325 MG PO TABS: 650.0000 mg | ORAL_TABLET | Freq: Four times a day (QID) | ORAL | Status: DC | PRN

## 2016-09-02 NOTE — Progress Notes (Signed)
09/02/2016 4:10 PM Discharge AVS meds taken today and those due this evening reviewed.  Follow-up appointments and when to call md reviewed.  D/C IV and TELE.  Questions and concerns addressed.   D/C home per orders. [place]

## 2016-09-02 NOTE — Progress Notes (Addendum)
      [address]       [place] [address]             [phone]        2 Days Post-Op Procedure(s) (LRB): TRANSCATHETER AORTIC VALVE REPLACEMENT, TRANSFEMORAL (N/A) TRANSESOPHAGEAL ECHOCARDIOGRAM (TEE) (N/A)  Subjective: Patient states he has not had bowel movement but is passing gas. He has some minor pain in right inguinal area.  Objective: Vital signs in last 24 hours: Temp:  [97.5 F (36.4 C)-98.4 F (36.9 C)] 98.1 F (36.7 C) (06/07 0530) Pulse Rate:  [44-119] 109 (06/07 0530) Cardiac Rhythm: Atrial fibrillation (06/06 2101) Resp:  [14-25] 18 (06/07 0530) BP: (107-138)/(45-95) 118/78 (06/07 0530) SpO2:  [93 %-100 %] 98 % (06/07 0530) Arterial Line BP: (150-164)/(74-75) 150/75 (06/06 0900) Weight:  [93.7 kg (206 lb 8 oz)-97.9 kg (215 lb 13.3 oz)] 93.7 kg (206 lb 8 oz) (06/07 0530)   Current Weight  09/02/16 93.7 kg (206 lb 8 oz)      Intake/Output from previous day: 06/06 0701 - 06/07 0700 In: 360 [P.O.:360] Out: 2487 [Urine:10]   Physical Exam:  Cardiovascular: Armandina Stammer, no murmur Pulmonary: Clear to auscultation bilaterally Abdomen: Soft, non tender, bowel sounds present. Extremities: No  lower extremity edema. Pulses intact. Wounds: Clean and dry.  No erythema or signs of infection. Ecchymosis left groin across perineum. No hematoma.   Lab Results: CBC: Recent Labs  09/01/16 0311 09/02/16 0149  WBC 7.7 8.0  HGB 9.1* 9.5*  HCT 28.1* 29.6*  PLT 140* 145*   BMET:  Recent Labs  09/01/16 0311 09/02/16 0149  NA 136 135  K 4.0 3.9  CL 102 97*  CO2 24 26  GLUCOSE 121* 99  BUN 23* 16  CREATININE 6.43* 4.78*  CALCIUM 7.8* 8.2*    PT/INR:  Lab Results  Component Value Date   INR 1.18 09/02/2016   INR 1.32 08/31/2016   INR 1.10 08/31/2016   ABG:  INR: Will add last result for INR, ABG once components are confirmed Will add last 4 CBG results once components are confirmed  Assessment/Plan:  1. CV - A fib with  HR in the low 100's. On Lopressor 25 mg bid and Coumadin. INR 1.18. Will continue with current Coumadin dose of 5 mg. Await echo. 2.  Pulmonary - On room air. Encourage incentive spirometer. 3.  Acute blood loss anemia - H and H stable at 9.5 and 29.6 4. ESRD-s/p HD. Creatinine decreased to 4.78. Nephrology following. 5. DM (diet controlled)-CBGs 90/131/109. 7. Mild thrombocytopenia-platelets up to 145,000  ZIMMERMAN,DONIELLE MPA-C 09/02/2016,7:18 AM   Chart reviewed, patient examined, agree with above. He tolerated HD well yesterday but was tired at the end and did not walk last night. He feels well this am. Valve sounds good. Echo not done yesterday because he was in dialysis. He should walk some this am and can go home later today after echo done. Continue home Coumadin dose.

## 2016-09-02 NOTE — Progress Notes (Signed)
  Echocardiogram 2D Echocardiogram limited has been performed.  Dorissa Stinnette L Androw 09/02/2016, 12:01 PM

## 2016-09-02 NOTE — Progress Notes (Signed)
Admit: 08/31/2016 LOS: 2  45M ESRD MWF Alexander [name] s/p TAVF 08/31/16  Subjective:  HD yesterday:  2.5L UF post weight 93.7kg Feels well Has walked halls already this AM HD yesterday wasn't too difficult  06/06 0701 - 06/07 0700 In: 360 [P.O.:360] Out: 2487 [Urine:10]  Filed Weights   09/01/16 1418 09/01/16 1700 09/02/16 0530  Weight: 94.9 kg (209 lb 3.5 oz) 93.7 kg (206 lb 8 oz) 93.7 kg (206 lb 8 oz)    Scheduled Meds: . aspirin EC  81 mg Oral Daily  . atorvastatin  20 mg Oral q1800  . bromocriptine  5 mg Oral QHS  . calcium acetate  667 mg Oral TID WC & HS  . Chlorhexidine Gluconate Cloth  6 each Topical Daily  . cinacalcet  30 mg Oral QODAY  . dorzolamide-timolol  1 drop Right Eye BID  . FLUoxetine  40 mg Oral Daily  . insulin aspart  0-24 Units Subcutaneous TID AC & HS  . loratadine  10 mg Oral Daily  . metoprolol tartrate  25 mg Oral BID  . moving right along book   Does not apply Once  . multivitamin  1 tablet Oral QHS  . mupirocin ointment  1 application Nasal BID  . pantoprazole  40 mg Oral QAC breakfast  . predniSONE  5 mg Oral Daily  . sodium chloride flush  3 mL Intravenous Q12H  . warfarin  5 mg Oral ONCE-1800  . Warfarin - Physician Dosing Inpatient   Does not apply q1800   Continuous Infusions: . sodium chloride     PRN Meds:.sodium chloride, acetaminophen, ondansetron **OR** ondansetron (ZOFRAN) IV, sodium chloride flush  Current Labs: reviewed    Physical Exam:  Blood pressure 118/78, pulse (!) 109, temperature 98.1 F (36.7 C), temperature source Oral, resp. rate 18, height 5' 10.5" (1.791 m), weight 93.7 kg (206 lb 8 oz), SpO2 98 %. GEN: No acute distress, sitting in chair, conversant ENT: NCAT EYES: Blind, CV: RRR, normal S1 and S2 PULM: Clear bilaterally ABD: Soft, nontender SKIN: No rashes or lesions EXT: AV fistula with bruit and thrill, trace edema  Outpt HD Orders Unit: [facility] Days: MWF Time: 4h Dialyzer: F160 EDW: 94kg K/Ca: 2K  2.5Ca Access: AVF Needle Size: 15g BFR/DFR: 450 / A1.5 UF Proflie: none VDRA: none currently EPO: Mircera 28mcg q4wk last dose 5/16 IV Fe: not current Heparin: 2800 units IVB Treatment Adherence: excellent  A 1. ESRD via AVF MWF 2. Severe AS s/p TAVR 6/5 3. pAFib on warfarin 4. 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 DM2 6. HTN  P 1. OK with discharge, can return to outpatient HD tomorrow   Pearson Grippe MD 09/02/2016, 8:44 AM   Recent Labs Lab 08/31/16 1207 08/31/16 1519  08/31/16 1633 09/01/16 0311 09/02/16 0149  NA 138 138  < > 141 136 135  K 3.4* 3.3*  < > 3.1* 4.0 3.9  CL 101 101  --   --  102 97*  CO2 24  --   --   --  24 26  GLUCOSE 85 125*  < > 103* 121* 99  BUN 20 21*  --   --  23* 16  CREATININE 5.88* 6.10*  --   --  6.43* 4.78*  CALCIUM 8.1*  --   --   --  7.8* 8.2*  < > = values in this interval not displayed.  Recent Labs Lab 08/31/16 1630 08/31/16 1633 09/01/16 0311 09/02/16 0149  WBC 6.2  --  7.7  8.0  HGB 8.5* 8.5* 9.1* 9.5*  HCT 26.3* 25.0* 28.1* 29.6*  MCV 101.5*  --  101.4* 103.5*  PLT 126*  --  140* 145*

## 2016-09-02 NOTE — Progress Notes (Signed)
Progress Note  Patient Name: Alexander [name] Date of Encounter: 09/02/2016  Primary Cardiologist: Oval Linsey  Subjective   No chest pain or dyspnea  Inpatient Medications    Scheduled Meds: . aspirin EC  81 mg Oral Daily  . atorvastatin  20 mg Oral q1800  . bromocriptine  5 mg Oral QHS  . calcium acetate  667 mg Oral TID WC & HS  . Chlorhexidine Gluconate Cloth  6 each Topical Daily  . cinacalcet  30 mg Oral QODAY  . dorzolamide-timolol  1 drop Right Eye BID  . FLUoxetine  40 mg Oral Daily  . insulin aspart  0-24 Units Subcutaneous TID AC & HS  . loratadine  10 mg Oral Daily  . metoprolol tartrate  25 mg Oral BID  . moving right along book   Does not apply Once  . multivitamin  1 tablet Oral QHS  . mupirocin ointment  1 application Nasal BID  . pantoprazole  40 mg Oral QAC breakfast  . predniSONE  5 mg Oral Daily  . sodium chloride flush  3 mL Intravenous Q12H  . warfarin  5 mg Oral ONCE-1800  . Warfarin - Physician Dosing Inpatient   Does not apply q1800   Continuous Infusions: . sodium chloride     PRN Meds: sodium chloride, acetaminophen, ondansetron **OR** ondansetron (ZOFRAN) IV, sodium chloride flush   Vital Signs    Vitals:   09/01/16 1515 09/01/16 1700 09/01/16 1955 09/02/16 0530  BP: 132/78 131/76 107/90 118/78  Pulse: (!) 102 (!) 104 (!) 112 (!) 109  Resp: 14 18 18 18   Temp: 97.8 F (36.6 C) 98.2 F (36.8 C) 98.4 F (36.9 C) 98.1 F (36.7 C)  TempSrc: Oral Oral Oral Oral  SpO2: 100% 99% 98% 98%  Weight:  206 lb 8 oz (93.7 kg)  206 lb 8 oz (93.7 kg)  Height:  5' 10.5" (1.791 m)      Intake/Output Summary (Last 24 hours) at 09/02/16 0840 Last data filed at 09/01/16 1800  Gross per 24 hour  Intake              360 ml  Output             2477 ml  Net            -2117 ml   Filed Weights   09/01/16 1418 09/01/16 1700 09/02/16 0530  Weight: 209 lb 3.5 oz (94.9 kg) 206 lb 8 oz (93.7 kg) 206 lb 8 oz (93.7 kg)    Telemetry    Atrial fib -  Personally Reviewed  ECG      Physical Exam   General: Well developed, well nourished, NAD  HEENT: OP clear, mucus membranes moist  SKIN: warm, dry. No rashes. Neuro: No focal deficits  Musculoskeletal: Muscle strength 5/5 all ext  Psychiatric: Mood and affect normal  Neck: No JVD, no carotid bruits, no thyromegaly, no lymphadenopathy.  Lungs:Clear bilaterally, no wheezes, rhonci, crackles Cardiovascular: Irreg irreg No murmurs, gallops or rubs. Abdomen:Soft. Bowel sounds present. Non-tender.  Extremities: No lower extremity edema. Pulses are 2 + in the bilateral DP/PT. Bilateral groin without hematoma   Labs    Chemistry Recent Labs Lab 08/31/16 1207 08/31/16 1519  08/31/16 1633 09/01/16 0311 09/02/16 0149  NA 138 138  < > 141 136 135  K 3.4* 3.3*  < > 3.1* 4.0 3.9  CL 101 101  --   --  102 97*  CO2 24  --   --   --  24 26  GLUCOSE 85 125*  < > 103* 121* 99  BUN 20 21*  --   --  23* 16  CREATININE 5.88* 6.10*  --   --  6.43* 4.78*  CALCIUM 8.1*  --   --   --  7.8* 8.2*  PROT 5.9*  --   --   --   --   --   ALBUMIN 3.3*  --   --   --   --   --   AST 20  --   --   --   --   --   ALT 24  --   --   --   --   --   ALKPHOS 61  --   --   --   --   --   BILITOT 1.0  --   --   --   --   --   GFRNONAA 8*  --   --   --  8* 11*  GFRAA 10*  --   --   --  9* 13*  ANIONGAP 13  --   --   --  10 12  < > = values in this interval not displayed.   Hematology  Recent Labs Lab 08/31/16 1630 08/31/16 1633 09/01/16 0311 09/02/16 0149  WBC 6.2  --  7.7 8.0  RBC 2.59*  --  2.77* 2.86*  HGB 8.5* 8.5* 9.1* 9.5*  HCT 26.3* 25.0* 28.1* 29.6*  MCV 101.5*  --  101.4* 103.5*  MCH 32.8  --  32.9 33.2  MCHC 32.3  --  32.4 32.1  RDW 16.2*  --  16.2* 16.5*  PLT 126*  --  140* 145*      Radiology    Dg Chest 2 View  Result Date: 08/31/2016 CLINICAL DATA:  Pre operative respiratory exam. Aortic valve disease. EXAM: CHEST  2 VIEW COMPARISON:  Chest x-ray dated 01/17/2016 FINDINGS:  Heart size and pulmonary vascularity are normal. Pacemaker in place. There are no infiltrates or effusions. No bone abnormality. IMPRESSION: No active cardiopulmonary disease. Electronically Signed   By: Lorriane Shire M.D.   On: 08/31/2016 12:19   Dg Chest Port 1 View  Result Date: 08/31/2016 CLINICAL DATA:  Status post transcatheter aortic valve replacement. EXAM: PORTABLE CHEST 1 VIEW COMPARISON:  Radiograph of same day. FINDINGS: Stable cardiomediastinal silhouette. Right-sided pacemaker is unchanged in position. Atherosclerosis thoracic aorta is noted. No pneumothorax or pleural effusion is noted. Interval placement of right internal jugular catheter with distal tip in expected position of SVC. Bony thorax is unremarkable. Aortic valve replacement is noted. IMPRESSION: No pneumothorax is noted. Status post transcatheter aortic valve replacement. Aortic atherosclerosis. Electronically Signed   By: Marijo Conception, M.D.   On: 08/31/2016 16:29    Cardiac Studies     Patient Profile     76 y.o. male with atrial fib, ESRD on HD, HTN, HLD, severe AS s/p TAR on 08/31/16  Assessment & Plan    1. Severe aortic valve stenosis: He is two days post TAVR. He is doing well. Will continue ASA and coumadin. Echo is pending today.    2. Atrial fibrillation, persistent: Rate controlled on metoprolol. Continue coumadin.   3. ESRD: Nephrology following.   Probable d/c home later today if echo is ok.   Signed, Lauree Chandler, MD  09/02/2016, 8:40 AM

## 2016-09-02 NOTE — Discharge Summary (Signed)
Physician Discharge Summary       [address]       [place] [address]             [phone]    Patient ID: Alexander [name] MRN: [id number] DOB/AGE: [birth year] 76 y.o.  Admit date: 08/31/2016 Discharge date: 09/02/2016  Admission Diagnoses: Severe aortic stenosis  Active Diagnoses:  1. Bradycardia-[employer] [phone] Assurity dual-lead pacemaker 2.CVA (cerebral infarction)-2004/affected left side 3.Diabetes mellitus without complication ([place]) 4.Diabetic peripheral neuropathy ([place]) 5.End stage renal disease ([place]) 6.Hyperlipidemia 7.Hypertension 8.Paroxysmal atrial fibrillation ([facility]) 9.Tubular adenoma of colon 10.Peptic ulcer 11.Macular degeneration 12.GERD (gastroesophageal reflux disease) 13.Anemia 14.Barrett's esophagus 15.Depression  Consultant: Nephrology  Procedure (s):   Transcatheter Aortic Valve Replacement - Percutaneous right Transfemoral Approach             Edwards Sapien 3 THV (size 29 mm, model # 9600TFX, serial # [id number]) by Dr. Cyndia Bent and Dr. Angelena Form  History of Presenting Illness: The patient is a 76 year old gentleman with diabetes, hypertension, hyperlipidemia, ESRD on HD, PAF on Coumadin, prior stroke in 2004 with some left sided weakness and aphasia, bradycardia s/p PPM and aortic stenosis. Over the past year he has noted progressive exertional shortness of breath and fatigue. Recently this has been occurring with walking around his house. He has had occasional chest pressure with exertion and frequent dizziness with some episodes of syncope and falls. He has had some problems with hypotension on dialysis and takes Midodrine on his dialysis days. An echo on 06/21/2016 showed a normal LVEF of 60-65%. The aortic valve is trileaflet with severe leaflet calcification with a mean gradient of 33 mm Hg and a peak of 61 mm Hg. A nuclear stress test on 07/08/2016 showed an LVEF of 55-65% with no ischemia. He underwent a TEE by [name] on 4/26  which showed a mean AV gradient of 25 mm Hg and a peak of 40 mm Hg. LVEF was normal. He was referred to Dr. Angelena Form for consideration of TAVR and cath was done on 5/25 showing only mild non-obstructive CAD. The mean AV gradient was only measured at 14.9 mm Hg.  The patient says that he is mainly limited by his shortness of breath and fatigue with exertion. He is legally blind due to macular degeneration which has reduced his independent mobility but he still tries to be active. He lives at home with his wife who is about to start chemotherapy for cancer. He has three daughters and one is with him today along with his wife.   The patient and his wife and daughter werecounseled at length regarding treatment alternatives for management of severe symptomatic aortic stenosis. The risks and benefits of surgical intervention has been discussed in detail. Long-term prognosis with medical therapy was discussed. Alternative approaches such as conventional surgical aortic valve replacement, transcatheter aortic valve replacement, and palliative medical therapy were compared and contrasted at length. This discussion was placed in the context of the patient's own specific clinical presentation and past medical history. All of their questions have been addressed. The patient is eager to proceed with surgical management as soon as possible before his wife has to start chemotherapy. Patient was admitted to [facility] on 08/31/2016 in order to undergo a transcatheter Aortic Valve Replacement - Percutaneous right Transfemoral Approach.   Brief Hospital Course:  He remained afebrile and hemodynamically stable. He was restarted on low dose Lopressor and Coumadin. His PT and INR were monitored daily. His  INR this am is decreased to 1.18. He was ambulating on a few liters of oxygen via [place] and was weaned to room air. He has ESRD and nephrology was consulted post op. He had HD 09/01/2016. He was surgically stable for  transfer from the ICU to 2000 for further convalescence on 06/06. Patient has minor ecchymosis left groin across perineum on exam, but there is no hematoma. Distal pulses are intact. He is tolerating a diet. H and H are stable at 9.5 and 29.6. He has mild thrombocytopenia and his platelets were up to 145,000 today. Creatinine is down to 4.78 as he had HD yesterday. He will continue his HD days of Monday, Wednesday, and Friday after discharge. Echo done today showed LVEF 50-55%, pattern of mild LVH, no significant para valvular regurgitation, and peak gradient 15 mm HG. Patient was seen and evaluated by both Dr. Cyndia Bent and Dr. Angelena Form. Patient is felt surgically stable for discharge today.   Latest Vital Signs: Blood pressure 115/75, pulse (!) 108, temperature 98 F (36.7 C), temperature source Oral, resp. rate 18, height 5' 10.5" (1.791 m), weight 93.7 kg (206 lb 8 oz), SpO2 97 %.  Physical Exam: Cardiovascular: IRRR, IRRR, no murmur Pulmonary: Clear to auscultation bilaterally Abdomen: Soft, non tender, bowel sounds present. Extremities: No  lower extremity edema. Pulses intact. Wounds: Clean and dry.  No erythema or signs of infection. Ecchymosis left groin across perineum. No hematoma.   Discharge Condition:Stable and discharged to home.  Recent laboratory studies:  Lab Results  Component Value Date   WBC 8.0 09/02/2016   HGB 9.5 (L) 09/02/2016   HCT 29.6 (L) 09/02/2016   MCV 103.5 (H) 09/02/2016   PLT 145 (L) 09/02/2016   Lab Results  Component Value Date   NA 135 09/02/2016   K 3.9 09/02/2016   CL 97 (L) 09/02/2016   CO2 26 09/02/2016   CREATININE 4.78 (H) 09/02/2016   GLUCOSE 99 09/02/2016    Diagnostic Studies: Dg Chest 2 View  Result Date: 08/31/2016 CLINICAL DATA:  Pre operative respiratory exam. Aortic valve disease. EXAM: CHEST  2 VIEW COMPARISON:  Chest x-ray dated 01/17/2016 FINDINGS: Heart size and pulmonary vascularity are normal. Pacemaker in place. There are no  infiltrates or effusions. No bone abnormality. IMPRESSION: No active cardiopulmonary disease. Electronically Signed   By: Lorriane Shire M.D.   On: 08/31/2016 12:19   Ct Coronary Morph W/cta Cor W/score W/ca W/cm &/or Wo/cm  Addendum Date: 08/26/2016   ADDENDUM REPORT: 08/26/2016 08:10 EXAM: OVER-READ INTERPRETATION  CT CHEST The following report is an over-read performed by radiologist Dr. Rebekah Chesterfield [facility] on 08/26/2016. This over-read does not include interpretation of cardiac or coronary anatomy or pathology. The cardiac CTA interpretation by the cardiologist is attached. COMPARISON:  None. FINDINGS: Extracardiac findings will be described separately under dictation for contemporaneously obtained CTA of the chest, abdomen and pelvis. IMPRESSION: Please see separate dictation for contemporaneously obtained CTA of the chest, abdomen and pelvis 08/24/2016 for full description of extracardiac findings. Electronically Signed   By: Vinnie Langton M.D.   On: 08/26/2016 08:10   Result Date: 08/26/2016 CLINICAL DATA:  76 year old male with severe aortic stenosis. EXAM: Cardiac TAVR CT TECHNIQUE: The patient was scanned on a Philips 256 scanner. A 120 kV retrospective scan was triggered in the descending thoracic aorta at 111 HU's. Gantry rotation speed was 270 msecs and collimation was .9 mm. 10 mg of iv Metoprolol and no nitro were given. The 3D data  set was reconstructed in 5% intervals of the R-R cycle. Systolic and diastolic phases were analyzed on a dedicated work station using MPR, MIP and VRT modes. The patient received 80 cc of contrast. FINDINGS: Aortic Valve: Trileaflet, severely thickened with asymmetric calcifications predominantly in the non-coronary leaflet extending into the LVOT. Aorta:  Normal size, moderate diffuse calcifications, no dissection. Sinotubular Junction: 29 x 29 mm with almost circumferential calcifications Ascending Thoracic Aorta:  36 x 35 mm Aortic Arch:   32 x 29 mm Descending Thoracic Aorta:  29 x 28 mm Sinus of Valsalva Measurements: Non-coronary:  39 mm Right -coronary:  38 mm Left -coronary:  37 mm Coronary Artery Height above Annulus: Left Main:  10.6 mm Right Coronary:  16 mm Virtual Basal Annulus Measurements: Maximum/Minimum Diameter:  30 x 22 mm Perimeter:  570 mm2 Area:  87 mm Optimum Fluoroscopic Angle for Delivery:  LAO 1 CAU 1 No left atrial appendage thrombus (verified with delayed images). No ASD/VSD. Two chamber pacemaker with leads seen in the right atrium and right ventricle. IMPRESSION: 1. Trileaflet, severely thickened with asymmetric calcifications predominantly in the non-coronary leaflet extending into the LVOT. Annular measurements suitable for delivery of a 29 mm Edwards-SAPIEN 3 valve. 2. Sufficient RCA to annulus distance, however borderline LM to annulus distance measuring 10 mm. 3. Optimum Fluoroscopic Angle for Delivery:  LAO 1 CAU 1 4. No left atrial appendage thrombus. Ena Dawley Electronically Signed: By: Ena Dawley On: 08/24/2016 12:09   Dg Chest Port 1 View  Result Date: 08/31/2016 CLINICAL DATA:  Status post transcatheter aortic valve replacement. EXAM: PORTABLE CHEST 1 VIEW COMPARISON:  Radiograph of same day. FINDINGS: Stable cardiomediastinal silhouette. Right-sided pacemaker is unchanged in position. Atherosclerosis thoracic aorta is noted. No pneumothorax or pleural effusion is noted. Interval placement of right internal jugular catheter with distal tip in expected position of SVC. Bony thorax is unremarkable. Aortic valve replacement is noted. IMPRESSION: No pneumothorax is noted. Status post transcatheter aortic valve replacement. Aortic atherosclerosis. Electronically Signed   By: Marijo Conception, M.D.   On: 08/31/2016 16:29   Ct Angio Chest Aorta W/cm &/or Wo/cm  Result Date: 08/26/2016 CLINICAL DATA:  76 year old male with history of severe aortic stenosis. Preprocedural study prior to potential  transcatheter aortic valve replacement (TAVR) procedure. EXAM: CT ANGIOGRAPHY CHEST, ABDOMEN AND PELVIS TECHNIQUE: Multidetector CT imaging through the chest, abdomen and pelvis was performed using the standard protocol during bolus administration of intravenous contrast. Multiplanar reconstructed images and MIPs were obtained and reviewed to evaluate the vascular anatomy. CONTRAST:  70 mL of Isovue 370. COMPARISON:  CT the chest, abdomen and pelvis 10/05/2015. FINDINGS: CTA CHEST FINDINGS Cardiovascular: Heart size is enlarged with biatrial dilatation. There is no significant pericardial fluid, thickening or pericardial calcification. There is aortic atherosclerosis, as well as atherosclerosis of the great vessels of the mediastinum and the coronary arteries, including calcified atherosclerotic plaque in the left main, left anterior descending, left circumflex and right coronary arteries. Severe thickening calcification of the aortic valve. Calcification of the mitral annulus. Right-sided pacemaker device in place with lead tip terminating in the right atrial appendage and near the right ventricular apex along the free wall. Mediastinum/Lymph Nodes: Multiple borderline enlarged and minimally enlarged mediastinal lymph nodes measuring up to 12 mm in short axis in the high right paratracheal nodal station (axial image 53 of series 5). Esophagus is unremarkable in appearance. No axillary lymphadenopathy. Lungs/Pleura: Small left pleural effusion lying dependently. No right pleural effusion. No suspicious  appearing pulmonary nodules or masses. No acute consolidative airspace disease. Musculoskeletal/Soft Tissues: There are no aggressive appearing lytic or blastic lesions noted in the visualized portions of the skeleton. CTA ABDOMEN AND PELVIS FINDINGS Hepatobiliary: No cystic or solid hepatic lesions. No intra or extrahepatic biliary ductal dilatation. Gallbladder is normal in appearance. Pancreas: No pancreatic mass.  No pancreatic ductal dilatation. No pancreatic or peripancreatic fluid or inflammatory changes. Spleen: Unremarkable. Adrenals/Urinary Tract: Severe atrophy of the kidneys bilaterally. Multiple small low-attenuation lesions in both kidneys compatible with simple cysts. Other smaller subcentimeter low-attenuation lesions in both kidneys, too small to characterize, but favored to represent tiny cysts. Bilateral adrenal glands are normal in appearance. No hydroureteronephrosis. Urinary bladder is unremarkable in appearance. Stomach/Bowel: Normal appearance of the stomach. No pathologic dilatation of small bowel or colon. Normal appendix. Numerous colonic diverticulae are noted, without surrounding inflammatory changes to suggest an acute diverticulitis at this time. Vascular/Lymphatic: Aortic atherosclerosis, without evidence of aneurysm or dissection in the abdominal or pelvic vasculature. Vascular findings and measurements pertinent to potential TAVR procedure, as detailed below. Circumaortic left renal vein (normal anatomical variant) incidentally noted. Celiac axis, superior mesenteric artery and inferior mesenteric artery are all widely patent without hemodynamically significant stenosis. Single renal arteries are patent bilaterally. No lymphadenopathy noted in the abdomen or pelvis. Reproductive: 1.4 cm cyst in the median lobe of the prostate gland incidentally noted. Seminal vesicles are unremarkable in appearance. Other: No significant volume of ascites.  No pneumoperitoneum. Musculoskeletal: There are no aggressive appearing lytic or blastic lesions noted in the visualized portions of the skeleton. VASCULAR MEASUREMENTS PERTINENT TO TAVR: AORTA: Minimal Aortic Diameter -  16 x 15 mm Severity of Aortic Calcification -  severe RIGHT PELVIS: Right Common Iliac Artery - Minimal Diameter - 5.6 x 6.8 mm Tortuosity - mild Calcification - severe Right External Iliac Artery - Minimal Diameter - 6.1 x 5.6 mm Tortuosity  - moderate Calcification - moderate Right Common Femoral Artery - Minimal Diameter - 8.2 x 6.9 mm Tortuosity - mild Calcification - moderate LEFT PELVIS: Left Common Iliac Artery - Minimal Diameter - 7.2 x 6.6 mm Tortuosity - mild Calcification - severe Left External Iliac Artery - Minimal Diameter - 7.6 x 7.4 mm Tortuosity - moderate Calcification - moderate Left Common Femoral Artery - Minimal Diameter - 7.9 x 7.2 mm Tortuosity - mild Calcification - moderate Review of the MIP images confirms the above findings. IMPRESSION: 1. Vascular findings and measurements pertinent to potential TAVR procedure, as detailed above. This patient does appear to have suitable pelvic arterial access bilaterally. 2. Severe thickening calcification of the aortic valve, compatible with the reported clinical history of severe aortic stenosis. 3. Small left pleural effusion lying dependently. 4. Multiple borderline enlarged and minimally enlarged mediastinal lymph nodes. This is nonspecific and stable compared to remote prior study from 10/05/2015, presumably benign. 5. Cardiomegaly with biatrial dilatation. 6. Colonic diverticulosis without evidence of acute diverticulitis at this time. 7. Severe bilateral renal atrophy. 8. Additional incidental findings, as above. Electronically Signed   By: Vinnie Langton M.D.   On: 08/26/2016 09:30   Ct Angio Abd/pel W/ And/or W/o  Result Date: 08/26/2016 CLINICAL DATA:  76 year old male with history of severe aortic stenosis. Preprocedural study prior to potential transcatheter aortic valve replacement (TAVR) procedure. EXAM: CT ANGIOGRAPHY CHEST, ABDOMEN AND PELVIS TECHNIQUE: Multidetector CT imaging through the chest, abdomen and pelvis was performed using the standard protocol during bolus administration of intravenous contrast. Multiplanar reconstructed images and MIPs were obtained  and reviewed to evaluate the vascular anatomy. CONTRAST:  70 mL of Isovue 370. COMPARISON:  CT the chest,  abdomen and pelvis 10/05/2015. FINDINGS: CTA CHEST FINDINGS Cardiovascular: Heart size is enlarged with biatrial dilatation. There is no significant pericardial fluid, thickening or pericardial calcification. There is aortic atherosclerosis, as well as atherosclerosis of the great vessels of the mediastinum and the coronary arteries, including calcified atherosclerotic plaque in the left main, left anterior descending, left circumflex and right coronary arteries. Severe thickening calcification of the aortic valve. Calcification of the mitral annulus. Right-sided pacemaker device in place with lead tip terminating in the right atrial appendage and near the right ventricular apex along the free wall. Mediastinum/Lymph Nodes: Multiple borderline enlarged and minimally enlarged mediastinal lymph nodes measuring up to 12 mm in short axis in the high right paratracheal nodal station (axial image 53 of series 5). Esophagus is unremarkable in appearance. No axillary lymphadenopathy. Lungs/Pleura: Small left pleural effusion lying dependently. No right pleural effusion. No suspicious appearing pulmonary nodules or masses. No acute consolidative airspace disease. Musculoskeletal/Soft Tissues: There are no aggressive appearing lytic or blastic lesions noted in the visualized portions of the skeleton. CTA ABDOMEN AND PELVIS FINDINGS Hepatobiliary: No cystic or solid hepatic lesions. No intra or extrahepatic biliary ductal dilatation. Gallbladder is normal in appearance. Pancreas: No pancreatic mass. No pancreatic ductal dilatation. No pancreatic or peripancreatic fluid or inflammatory changes. Spleen: Unremarkable. Adrenals/Urinary Tract: Severe atrophy of the kidneys bilaterally. Multiple small low-attenuation lesions in both kidneys compatible with simple cysts. Other smaller subcentimeter low-attenuation lesions in both kidneys, too small to characterize, but favored to represent tiny cysts. Bilateral adrenal glands are  normal in appearance. No hydroureteronephrosis. Urinary bladder is unremarkable in appearance. Stomach/Bowel: Normal appearance of the stomach. No pathologic dilatation of small bowel or colon. Normal appendix. Numerous colonic diverticulae are noted, without surrounding inflammatory changes to suggest an acute diverticulitis at this time. Vascular/Lymphatic: Aortic atherosclerosis, without evidence of aneurysm or dissection in the abdominal or pelvic vasculature. Vascular findings and measurements pertinent to potential TAVR procedure, as detailed below. Circumaortic left renal vein (normal anatomical variant) incidentally noted. Celiac axis, superior mesenteric artery and inferior mesenteric artery are all widely patent without hemodynamically significant stenosis. Single renal arteries are patent bilaterally. No lymphadenopathy noted in the abdomen or pelvis. Reproductive: 1.4 cm cyst in the median lobe of the prostate gland incidentally noted. Seminal vesicles are unremarkable in appearance. Other: No significant volume of ascites.  No pneumoperitoneum. Musculoskeletal: There are no aggressive appearing lytic or blastic lesions noted in the visualized portions of the skeleton. VASCULAR MEASUREMENTS PERTINENT TO TAVR: AORTA: Minimal Aortic Diameter -  16 x 15 mm Severity of Aortic Calcification -  severe RIGHT PELVIS: Right Common Iliac Artery - Minimal Diameter - 5.6 x 6.8 mm Tortuosity - mild Calcification - severe Right External Iliac Artery - Minimal Diameter - 6.1 x 5.6 mm Tortuosity - moderate Calcification - moderate Right Common Femoral Artery - Minimal Diameter - 8.2 x 6.9 mm Tortuosity - mild Calcification - moderate LEFT PELVIS: Left Common Iliac Artery - Minimal Diameter - 7.2 x 6.6 mm Tortuosity - mild Calcification - severe Left External Iliac Artery - Minimal Diameter - 7.6 x 7.4 mm Tortuosity - moderate Calcification - moderate Left Common Femoral Artery - Minimal Diameter - 7.9 x 7.2 mm  Tortuosity - mild Calcification - moderate Review of the MIP images confirms the above findings. IMPRESSION: 1. Vascular findings and measurements pertinent to potential TAVR procedure, as detailed above.  This patient does appear to have suitable pelvic arterial access bilaterally. 2. Severe thickening calcification of the aortic valve, compatible with the reported clinical history of severe aortic stenosis. 3. Small left pleural effusion lying dependently. 4. Multiple borderline enlarged and minimally enlarged mediastinal lymph nodes. This is nonspecific and stable compared to remote prior study from 10/05/2015, presumably benign. 5. Cardiomegaly with biatrial dilatation. 6. Colonic diverticulosis without evidence of acute diverticulitis at this time. 7. Severe bilateral renal atrophy. 8. Additional incidental findings, as above. Electronically Signed   By: Vinnie Langton M.D.   On: 08/26/2016 09:30         Discharge Medications: Allergies as of 09/02/2016      Reactions   Penicillins Swelling   SWELLING REACTION UNSPECIFIED  PATIENT HAD A PCN REACTION WITH IMMEDIATE RASH, FACIAL/TONGUE/THROAT SWELLING, SOB, OR LIGHTHEADEDNESS WITH HYPOTENSION:  #  #  #  YES  #  #  #   Has patient had a PCN reaction causing severe rash involving mucus membranes or skin necrosis: No Has patient had a PCN reaction that required hospitalization No Has patient had a PCN reaction occurring within the last 10 years: No If all of the above answers are "NO", then may proceed with Cephalosporin use.   Codeine Nausea Only   Tramadol Nausea Only      Medication List    STOP taking these medications   enoxaparin 80 MG/0.8ML injection Commonly known as:  LOVENOX   midodrine 5 MG tablet Commonly known as:  PROAMATINE     TAKE these medications   acetaminophen 325 MG tablet Commonly known as:  TYLENOL Take 2 tablets (650 mg total) by mouth every 6 (six) hours as needed for mild pain.   aspirin 81 MG EC  tablet Take 1 tablet (81 mg total) by mouth daily. Start taking on:  09/03/2016   atorvastatin 40 MG tablet Commonly known as:  LIPITOR Take 20 mg by mouth daily.   b complex-vitamin c-folic acid 0.8 MG Tabs tablet Take 1 tablet by mouth daily.   bromocriptine 5 MG capsule Commonly known as:  PARLODEL Take 5 mg by mouth at bedtime.   calcium acetate 667 MG capsule Commonly known as:  PHOSLO Take 667 mg by mouth 4 (four) times daily.   cetirizine 10 MG tablet Commonly known as:  ZYRTEC Take 10 mg by mouth at bedtime.   dorzolamide-timolol 22.3-6.8 MG/ML ophthalmic solution Commonly known as:  COSOPT Place 1 drop into the right eye 2 (two) times daily.   FLUoxetine 20 MG capsule Commonly known as:  PROZAC Take 40 mg by mouth daily.   metoprolol tartrate 25 MG tablet Commonly known as:  LOPRESSOR TAKE 1 TABLET BY MOUTH TWICE DAILY   mupirocin ointment 2 % Commonly known as:  BACTROBAN Place 1 application into the nose 2 (two) times daily. Last dose is on 09/04/2016   omeprazole 20 MG capsule Commonly known as:  PRILOSEC Take 20 mg by mouth daily.   predniSONE 5 MG tablet Commonly known as:  DELTASONE Take 5 mg by mouth daily.   SENSIPAR 30 MG tablet Generic drug:  cinacalcet Take 1 tablet (30 mg total) by mouth every other day. What changed:  when to take this   warfarin 5 MG tablet Commonly known as:  COUMADIN Take 1-1.5 tablets by mouth daily as directed by coumadin clinic         The patient has been discharged on:   1.Beta Blocker:  Yes [  x ]  No   [   ]                              If No, reason:  2.Ace Inhibitor/ARB: Yes [   ]                                     No  [ x  ]                                     If No, reason:Labile BP, preserved LVEF  3.Statin:   Yes [x   ]                  No  [   ]                  If No, reason:  4.Ecasa:  Yes  [x   ]                  No   [   ]                  If No,  reason:  Follow Up Appointments: [id number] Established Coumadin  Monday June 11 12:00 PM  [facility]  [place] [address] [place] [address]  [phone]   [phone] ECHOCARDIOGRAM  Thursday July 12 9:30 AM (Arrive by 9:00 AM)  [facility]  [place] [id number] [place] [address]  [phone]   [phone] OFFICE VISIT 20 with Lauree Chandler, MD  Thursday July 12 10:40 AM  [facility]  [address] [place] [address]  [phone]   [id number] REMOTE PACER CHECK  Tuesday August 7 9:55 AM  [facility]  [address] [place] [address]  [phone]   [phone] OFFICE VISIT 20 with Skeet Latch, MD  Thursday August 16 11:00 AM      Signed: Lars Pinks MPA-C 09/02/2016, 3:06 PM

## 2016-09-02 NOTE — Progress Notes (Signed)
CARDIAC REHAB PHASE I   Pt ambulating with family, HR 120s, no complaints. Cardiac surgery discharge education completed with pt and family at bedside. Reviewed restrictions, activity progression, exercise guidelines and phase 2 cardiac rehab. Pt follows renal diet, will defer to dialysis. Pt declines phase 2 cardiac rehab referral, states he is not able with his dialysis schedule, he does not drive and his wife is getting ready to begin chemo. Pt to recliner after walk, call bell within reach, family at bedside.    [phone] Lenna Sciara, RN, BSN 09/02/2016 10:23 AM

## 2016-09-03 ENCOUNTER — Telehealth: Payer: Self-pay | Admitting: Cardiovascular Disease

## 2016-09-03 DIAGNOSIS — N2581 Secondary hyperparathyroidism of renal origin: Secondary | ICD-10-CM | POA: Diagnosis not present

## 2016-09-03 DIAGNOSIS — I4819 Other persistent atrial fibrillation: Secondary | ICD-10-CM

## 2016-09-03 DIAGNOSIS — D631 Anemia in chronic kidney disease: Secondary | ICD-10-CM | POA: Diagnosis not present

## 2016-09-03 DIAGNOSIS — E1129 Type 2 diabetes mellitus with other diabetic kidney complication: Secondary | ICD-10-CM | POA: Diagnosis not present

## 2016-09-03 DIAGNOSIS — N186 End stage renal disease: Secondary | ICD-10-CM | POA: Diagnosis not present

## 2016-09-03 NOTE — Telephone Encounter (Signed)
New message     There is a discrepancy with the coumadin directions that he was sent home from the hospital with and what you advised them to do please call

## 2016-09-03 NOTE — Consult Note (Signed)
           [facility] CM Primary Care Navigator  09/03/2016  Alexander [name] [birth year] [id number]   Went to see patientat the bedsideto identify possible discharge needs but he was alreadydischarged.  Patient was discharged home per staff report.  Attempted to call primary care provider's office to notify of patient's discharge and need for post hospital follow-up and transition of care but office is already closed for the day. Noted cardiologist office had already been in contact with patient/ family via phone today (medication clarification).   For questions, please contact:  Dannielle Huh, BSN, RN- [facility] Primary Care Navigator  Telephone: [phone] [place]

## 2016-09-03 NOTE — Telephone Encounter (Signed)
Family concerned about taking ASA 81mg  together with warfarin therapy.   Educated on need to ASA 81mg  due to valve replacement.   No additional recommendation at this time. Will follow up INR on Monday 09/06/16

## 2016-09-06 ENCOUNTER — Ambulatory Visit (INDEPENDENT_AMBULATORY_CARE_PROVIDER_SITE_OTHER): Payer: Medicare Other | Admitting: Pharmacist

## 2016-09-06 DIAGNOSIS — E1129 Type 2 diabetes mellitus with other diabetic kidney complication: Secondary | ICD-10-CM | POA: Diagnosis not present

## 2016-09-06 DIAGNOSIS — Z7901 Long term (current) use of anticoagulants: Secondary | ICD-10-CM

## 2016-09-06 DIAGNOSIS — I480 Paroxysmal atrial fibrillation: Secondary | ICD-10-CM | POA: Diagnosis not present

## 2016-09-06 DIAGNOSIS — D631 Anemia in chronic kidney disease: Secondary | ICD-10-CM | POA: Diagnosis not present

## 2016-09-06 DIAGNOSIS — N2581 Secondary hyperparathyroidism of renal origin: Secondary | ICD-10-CM | POA: Diagnosis not present

## 2016-09-06 DIAGNOSIS — N186 End stage renal disease: Secondary | ICD-10-CM | POA: Diagnosis not present

## 2016-09-06 LAB — POCT INR: INR: 1.5

## 2016-09-07 DIAGNOSIS — H401133 Primary open-angle glaucoma, bilateral, severe stage: Secondary | ICD-10-CM | POA: Diagnosis not present

## 2016-09-08 DIAGNOSIS — D631 Anemia in chronic kidney disease: Secondary | ICD-10-CM | POA: Diagnosis not present

## 2016-09-08 DIAGNOSIS — N2581 Secondary hyperparathyroidism of renal origin: Secondary | ICD-10-CM | POA: Diagnosis not present

## 2016-09-08 DIAGNOSIS — E1129 Type 2 diabetes mellitus with other diabetic kidney complication: Secondary | ICD-10-CM | POA: Diagnosis not present

## 2016-09-08 DIAGNOSIS — N186 End stage renal disease: Secondary | ICD-10-CM | POA: Diagnosis not present

## 2016-09-10 DIAGNOSIS — E1129 Type 2 diabetes mellitus with other diabetic kidney complication: Secondary | ICD-10-CM | POA: Diagnosis not present

## 2016-09-10 DIAGNOSIS — D631 Anemia in chronic kidney disease: Secondary | ICD-10-CM | POA: Diagnosis not present

## 2016-09-10 DIAGNOSIS — N2581 Secondary hyperparathyroidism of renal origin: Secondary | ICD-10-CM | POA: Diagnosis not present

## 2016-09-10 DIAGNOSIS — N186 End stage renal disease: Secondary | ICD-10-CM | POA: Diagnosis not present

## 2016-09-12 ENCOUNTER — Telehealth: Payer: Self-pay | Admitting: Physician Assistant

## 2016-09-12 NOTE — Telephone Encounter (Signed)
Pt wife called because BP is up today and he is SOB. HR 90s.  Pt had HD yesterday, he required O2 the whole time, but they did not take off any extra fluid. However, he did not drink extra and does not have LE edema  Pt has not had am medications, wife was unable to locate metoprolol.  Advised he was to keep taking this after d/c from TAVR. She will look for the bottle and call the pharmacy if she cannot find it.  If the metoprolol does not help, she will call back or go to ER.  Rosaria Ferries, PA-C 09/12/2016 10:09 AM Beeper [phone]

## 2016-09-13 ENCOUNTER — Telehealth: Payer: Self-pay | Admitting: Cardiovascular Disease

## 2016-09-13 DIAGNOSIS — I214 Non-ST elevation (NSTEMI) myocardial infarction: Secondary | ICD-10-CM | POA: Diagnosis not present

## 2016-09-13 DIAGNOSIS — N62 Hypertrophy of breast: Secondary | ICD-10-CM | POA: Diagnosis not present

## 2016-09-13 DIAGNOSIS — H409 Unspecified glaucoma: Secondary | ICD-10-CM | POA: Diagnosis present

## 2016-09-13 DIAGNOSIS — I350 Nonrheumatic aortic (valve) stenosis: Secondary | ICD-10-CM | POA: Diagnosis not present

## 2016-09-13 DIAGNOSIS — Z950 Presence of cardiac pacemaker: Secondary | ICD-10-CM | POA: Diagnosis not present

## 2016-09-13 DIAGNOSIS — I5041 Acute combined systolic (congestive) and diastolic (congestive) heart failure: Secondary | ICD-10-CM | POA: Diagnosis not present

## 2016-09-13 DIAGNOSIS — I130 Hypertensive heart and chronic kidney disease with heart failure and stage 1 through stage 4 chronic kidney disease, or unspecified chronic kidney disease: Secondary | ICD-10-CM | POA: Diagnosis not present

## 2016-09-13 DIAGNOSIS — I509 Heart failure, unspecified: Secondary | ICD-10-CM | POA: Diagnosis not present

## 2016-09-13 DIAGNOSIS — H3530 Unspecified macular degeneration: Secondary | ICD-10-CM | POA: Diagnosis present

## 2016-09-13 DIAGNOSIS — T82838A Hemorrhage of vascular prosthetic devices, implants and grafts, initial encounter: Secondary | ICD-10-CM | POA: Diagnosis not present

## 2016-09-13 DIAGNOSIS — Z885 Allergy status to narcotic agent status: Secondary | ICD-10-CM | POA: Diagnosis not present

## 2016-09-13 DIAGNOSIS — Z880 Allergy status to penicillin: Secondary | ICD-10-CM | POA: Diagnosis not present

## 2016-09-13 DIAGNOSIS — Z888 Allergy status to other drugs, medicaments and biological substances status: Secondary | ICD-10-CM | POA: Diagnosis not present

## 2016-09-13 DIAGNOSIS — I480 Paroxysmal atrial fibrillation: Secondary | ICD-10-CM | POA: Diagnosis present

## 2016-09-13 DIAGNOSIS — E785 Hyperlipidemia, unspecified: Secondary | ICD-10-CM | POA: Diagnosis present

## 2016-09-13 DIAGNOSIS — N189 Chronic kidney disease, unspecified: Secondary | ICD-10-CM | POA: Diagnosis not present

## 2016-09-13 DIAGNOSIS — R0602 Shortness of breath: Secondary | ICD-10-CM | POA: Diagnosis not present

## 2016-09-13 DIAGNOSIS — N186 End stage renal disease: Secondary | ICD-10-CM | POA: Diagnosis present

## 2016-09-13 DIAGNOSIS — Z8673 Personal history of transient ischemic attack (TIA), and cerebral infarction without residual deficits: Secondary | ICD-10-CM | POA: Diagnosis not present

## 2016-09-13 DIAGNOSIS — R600 Localized edema: Secondary | ICD-10-CM | POA: Diagnosis not present

## 2016-09-13 DIAGNOSIS — I5043 Acute on chronic combined systolic (congestive) and diastolic (congestive) heart failure: Secondary | ICD-10-CM | POA: Diagnosis not present

## 2016-09-13 DIAGNOSIS — D649 Anemia, unspecified: Secondary | ICD-10-CM | POA: Diagnosis present

## 2016-09-13 DIAGNOSIS — Z87891 Personal history of nicotine dependence: Secondary | ICD-10-CM | POA: Diagnosis not present

## 2016-09-13 DIAGNOSIS — I4891 Unspecified atrial fibrillation: Secondary | ICD-10-CM | POA: Diagnosis not present

## 2016-09-13 DIAGNOSIS — R942 Abnormal results of pulmonary function studies: Secondary | ICD-10-CM | POA: Diagnosis not present

## 2016-09-13 DIAGNOSIS — J811 Chronic pulmonary edema: Secondary | ICD-10-CM | POA: Diagnosis not present

## 2016-09-13 NOTE — Telephone Encounter (Signed)
Patient's daughter called back to state that the patient is being admitted to the hospital at [facility] for fluid on the lungs. She verbalized that she wanted to give her thanks and appreciation to [name] and all for the support. She will keep the office updated.

## 2016-09-13 NOTE — Telephone Encounter (Signed)
New message  Pt c/o Shortness Of Breath: STAT if SOB developed within the last 24 hours or pt is noticeably SOB on the phone  1. Are you currently SOB (can you hear that pt is SOB on the phone)? Yes   2. How long have you been experiencing SOB? 2 days  3. Are you SOB when sitting or when up moving around? Both... Any distance   4. Are you currently experiencing any other symptoms? Per pt daughter pt not admitting to any symptoms

## 2016-09-13 NOTE — Telephone Encounter (Signed)
Spoke to the patient's daughter per the DPR. She stated [place] is feeling [place] sine he had been discharged from the hospital after his TAVR procedure. He has been complaining of shortness of breath and overall weakness. He denies chest pain. The wife did call the on call yesterday:  Pt wife called because BP is up today and he is SOB. HR 90s.  Pt had HD yesterday, he required O2 the whole time, but they did not take off any extra fluid. However, he did not drink extra and does not have LE edema  Pt has not had am medications, wife was unable to locate metoprolol.  Advised he was to keep taking this after d/c from TAVR. She will look for the bottle and call the pharmacy if she cannot find it.  If the metoprolol does not help, she will call back or go to ER.  Barrett, Suanne Marker, PA-C  The family was confused with the Metoprolol and Midodrine. The daughter stated that the patient took two Midodrine yesterday and one today. This medication had been discontinued at hospital discharge. After explanation of the two medications, she stated that maybe it was the Metoprolol that he had taken. The daughter stated that the blood pressure yesterday was 190/90. The daughter stated that he feels worse today than he did yesterday and has no way to check the blood pressure. She would like to take him to the [facility] ED to be assessed. She was advised that they may be best since he is worse and out of town. She was advised to call back if we could do anything else for them.

## 2016-09-13 NOTE — Telephone Encounter (Signed)
Patient daughter calling, states that she needs to speak with Alexander [name]. She is at [facility] and they are going to admit her father.

## 2016-09-15 ENCOUNTER — Telehealth: Payer: Self-pay | Admitting: Cardiovascular Disease

## 2016-09-15 NOTE — Telephone Encounter (Signed)
Pt is in the hospital in [facility]. Daughter wants to speak to Dr Suann Larry needs her to get some information for her please.

## 2016-09-15 NOTE — Telephone Encounter (Signed)
Records have been processed. And sent to [facility]

## 2016-09-15 NOTE — Telephone Encounter (Signed)
Patient daughter Alexander [name]) calling, states that her father was admitted to the [facility] in [place] on Sunday. She states that he was diagnosed with CHF and has fluid around his heart according to x-rays and echo. Alexander [name] states that fluid was not present when patient had tavr procedure weeks ago and states that Dr. Lavina Hamman is sending over some paperwork requesting information. Alexander [name] would like a call back. Thanks.

## 2016-09-15 NOTE — Telephone Encounter (Signed)
Spoke with daughter regarding patient being in the hospital since Monday with CHF, echo sent to Dr Lavina Hamman as requested [phone]

## 2016-09-15 NOTE — Telephone Encounter (Signed)
[facility] has sent request for records. Daughter is calling to make sure records are being sent. Will forward to Medical Records to send ASAP once they have request. Will also send message to Dr. Angelena Form and his nurse so they are aware.

## 2016-09-17 ENCOUNTER — Encounter: Payer: Self-pay | Admitting: *Deleted

## 2016-09-18 DIAGNOSIS — N186 End stage renal disease: Secondary | ICD-10-CM | POA: Diagnosis not present

## 2016-09-20 ENCOUNTER — Telehealth: Payer: Self-pay | Admitting: Cardiovascular Disease

## 2016-09-20 ENCOUNTER — Ambulatory Visit (INDEPENDENT_AMBULATORY_CARE_PROVIDER_SITE_OTHER): Payer: Medicare Other | Admitting: Pharmacist Clinician (PhC)/ Clinical Pharmacy Specialist

## 2016-09-20 DIAGNOSIS — N186 End stage renal disease: Secondary | ICD-10-CM | POA: Diagnosis not present

## 2016-09-20 DIAGNOSIS — D631 Anemia in chronic kidney disease: Secondary | ICD-10-CM | POA: Diagnosis not present

## 2016-09-20 DIAGNOSIS — Z7901 Long term (current) use of anticoagulants: Secondary | ICD-10-CM

## 2016-09-20 DIAGNOSIS — I480 Paroxysmal atrial fibrillation: Secondary | ICD-10-CM | POA: Diagnosis not present

## 2016-09-20 DIAGNOSIS — E1129 Type 2 diabetes mellitus with other diabetic kidney complication: Secondary | ICD-10-CM | POA: Diagnosis not present

## 2016-09-20 DIAGNOSIS — N2581 Secondary hyperparathyroidism of renal origin: Secondary | ICD-10-CM | POA: Diagnosis not present

## 2016-09-20 LAB — POCT INR: INR: 1.5

## 2016-09-20 NOTE — Telephone Encounter (Signed)
New message    Pt daughter is calling asking for a call back from RN. She did not say what it was in regards to.

## 2016-09-20 NOTE — Telephone Encounter (Signed)
Patient's daughter called in to say that [place] has recommended that the patient be referred to the heart failure clinic. The patient's daughter would like to see Dr. Haroldine Laws. Will route to the provider for recommendation.

## 2016-09-21 ENCOUNTER — Emergency Department (HOSPITAL_COMMUNITY)
Admission: EM | Admit: 2016-09-21 | Discharge: 2016-09-21 | Disposition: A | Discharge status: Home / Self Care | Payer: Medicare Other | Attending: Emergency Medicine | Admitting: Emergency Medicine

## 2016-09-21 ENCOUNTER — Emergency Department (HOSPITAL_COMMUNITY): Payer: Medicare Other

## 2016-09-21 ENCOUNTER — Other Ambulatory Visit: Payer: Self-pay

## 2016-09-21 DIAGNOSIS — E1140 Type 2 diabetes mellitus with diabetic neuropathy, unspecified: Secondary | ICD-10-CM | POA: Diagnosis not present

## 2016-09-21 DIAGNOSIS — N186 End stage renal disease: Secondary | ICD-10-CM | POA: Insufficient documentation

## 2016-09-21 DIAGNOSIS — Z7982 Long term (current) use of aspirin: Secondary | ICD-10-CM | POA: Insufficient documentation

## 2016-09-21 DIAGNOSIS — Z7952 Long term (current) use of systemic steroids: Secondary | ICD-10-CM | POA: Diagnosis not present

## 2016-09-21 DIAGNOSIS — Z87891 Personal history of nicotine dependence: Secondary | ICD-10-CM | POA: Insufficient documentation

## 2016-09-21 DIAGNOSIS — Z992 Dependence on renal dialysis: Secondary | ICD-10-CM | POA: Diagnosis not present

## 2016-09-21 DIAGNOSIS — Z7901 Long term (current) use of anticoagulants: Secondary | ICD-10-CM | POA: Insufficient documentation

## 2016-09-21 DIAGNOSIS — R55 Syncope and collapse: Secondary | ICD-10-CM | POA: Insufficient documentation

## 2016-09-21 DIAGNOSIS — Z79899 Other long term (current) drug therapy: Secondary | ICD-10-CM | POA: Diagnosis not present

## 2016-09-21 DIAGNOSIS — I517 Cardiomegaly: Secondary | ICD-10-CM | POA: Diagnosis not present

## 2016-09-21 LAB — I-STAT TROPONIN, ED
Troponin i, poc: 0.06 ng/mL (ref 0.00–0.08)
Troponin i, poc: 0.05 ng/mL (ref 0.00–0.08)

## 2016-09-21 LAB — CBC
HEMATOCRIT: 32.8 % — AB (ref 39.0–52.0)
Hemoglobin: 10.5 g/dL — ABNORMAL LOW (ref 13.0–17.0)
MCH: 32.9 pg (ref 26.0–34.0)
MCHC: 32 g/dL (ref 30.0–36.0)
MCV: 102.8 fL — AB (ref 78.0–100.0)
PLATELETS: 151 10*3/uL (ref 150–400)
RBC: 3.19 MIL/uL — ABNORMAL LOW (ref 4.22–5.81)
RDW: 18.4 % — AB (ref 11.5–15.5)
WBC: 9.3 10*3/uL (ref 4.0–10.5)

## 2016-09-21 LAB — BASIC METABOLIC PANEL
Anion gap: 12 (ref 5–15)
BUN: 21 mg/dL — ABNORMAL HIGH (ref 6–20)
CHLORIDE: 98 mmol/L — AB (ref 101–111)
CO2: 30 mmol/L (ref 22–32)
CREATININE: 4.65 mg/dL — AB (ref 0.61–1.24)
Calcium: 8.7 mg/dL — ABNORMAL LOW (ref 8.9–10.3)
GFR calc Af Amer: 13 mL/min — ABNORMAL LOW (ref >60)
GFR calc non Af Amer: 11 mL/min — ABNORMAL LOW (ref >60)
GLUCOSE: 118 mg/dL — AB (ref 65–99)
POTASSIUM: 3.8 mmol/L (ref 3.5–5.1)
SODIUM: 140 mmol/L (ref 135–145)

## 2016-09-21 LAB — CBG MONITORING, ED: Glucose-Capillary: 98 mg/dL (ref 65–99)

## 2016-09-21 NOTE — Discharge Instructions (Signed)
Your workup has been normal in the ED. Please make sure to follow up with her primary care doctor and your cardiologist. Return to the ED if he develops any worsening symptoms.

## 2016-09-21 NOTE — Telephone Encounter (Signed)
I don't think that is necessary.  He has normal EF and became volume overloaded in the setting of a medication mix up where he misunderstood discharge instructions and was accidentally taking midodrine instead of metoprolol.  Unless something has changed, I don't think he needs to be seen in heart failure clinic.  I would prefer to see him back in the office before making that referral.

## 2016-09-21 NOTE — ED Provider Notes (Signed)
[facility] DEPT Provider Note   CSN: [id number] Arrival date & time: 09/21/16  0522     History   Chief Complaint Chief Complaint  Patient presents with  . Near Syncope    HPI Alexander [name] is a 76 y.o. male.  HPI 76 year old Caucasian male past medical history significant for recent TAVR, CHF  (last EF 08/2016 was 50-55%) (last heart cath in 07/2016 revealed mild non obstructive CAD), diabetes, hypertension, A. fib currently on Coumadin, ESRD currently on dialysis, that presents to the emergency department for evaluation after a near syncopal event. Patient is currently in the ER with his wife. Was in the lobby and got up from a sitting position. Clinically and felt very lightheaded, dizzy and tunnel vision. He was going to pass out but denies any LOC. His near-syncopal event was witnessed by staff and his daughter. States that he did not hit his head. He was helped to the floor and put onto a stretcher. He denies any chest pain or shortness breath prior to the episode. Daughter states the patient was recently discharged for a 5 day stay last week at [facility] for CHF exacerbation. He recently had a TAVR 3-4 weeks ago for aortic stenosis. Patient currently feels at baseline. Pt denies any fever, chill, ha, vision changes, congestion, neck pain, cp, sob, cough, abd pain, n/v/d, urinary symptoms, change in bowel habits, melena, hematochezia, lower extremity paresthesias.  Past Medical History:  Diagnosis Date  . Anemia   . Aortic stenosis 06/15/12   TEE - EF 74-08%; grade 1 diastolic dysfunction; mild/mod aortic valve stenosis; Mitral valve had calcified annulus, mild pulm htn PA peak pressure 61mmHg  . Barrett's esophagus 05/2003  . Bradycardia 2017   [employer] 2240 Assurity dual-lead pacemaker  . Carpal tunnel syndrome, bilateral 11/03/2015  . Colon polyps   . CVA (cerebral infarction)    2004/affected left side  . Depression   . Diabetes mellitus without  complication ([place])   . Diabetic peripheral neuropathy ([place]) 10/02/2015  . Diverticulosis   . Dyspnea    with exertion  . End stage renal disease ([place])    hemodialysis 3 times a week  . GERD (gastroesophageal reflux disease)   . Glaucoma   . Hyperlipidemia   . Hypertension   . Kidney stones   . Legally blind   . Macular degeneration    both eyes  . Orthostatic hypotension 09/09/2015  . Paroxysmal atrial fibrillation (H[facility])   . Peptic ulcer    bleeding, 1969  . Presence of permanent cardiac pacemaker   . S/P epidural steroid injection    last  injection over 10 years ago  . Seasonal allergies   . Tubular adenoma of colon 07/2001    Patient Active Problem List   Diagnosis Date Noted  . S/P TAVR (transcatheter aortic valve replacement) 08/31/2016  . Long term (current) use of anticoagulants [Z79.01] 08/13/2016  . Persistent atrial fibrillation ([place])   . Carpal tunnel syndrome, bilateral 11/03/2015  . Hand pain 10/02/2015  . Diabetic peripheral neuropathy ([place]) 10/02/2015  . Orthostatic hypotension 09/09/2015  . Bradycardia 03/11/2015  . End stage renal disease ([place]) 01/28/2015  . Left arm pain 01/28/2015  . Long-term (current) use of anticoagulants 04/08/2014  . Acute bronchitis 04/05/2014  . Hyperlipidemia 04/04/2014  . ESRD (end stage renal disease) on dialysis 03/13/2013  . PAD (peripheral artery disease) ([place]) 12/12/2012  . Chest pain 12/08/2012  . Severe aortic stenosis 06/15/2012  . Personal history of  colonic polyps 02/03/2011  . Esophageal reflux 02/03/2011  . Anemia, unspecified 02/03/2011    Past Surgical History:  Procedure Laterality Date  . AV FISTULA PLACEMENT  2009  . BACK SURGERY    . CARDIOVERSION N/A 11/13/2015   Procedure: CARDIOVERSION;  Surgeon: Troy Sine, MD;  Location: [facility] ENDOSCOPY;  Service: Cardiovascular;  Laterality: N/A;  . CARDIOVERSION N/A 01/13/2016   Procedure: CARDIOVERSION;  Surgeon: Will Meredith [name], MD;  Location: [facility];  Service: Cardiovascular;  Laterality: N/A;  . [place]   right eye  . CYSTOSCOPY  several times   kidney stones  . EP IMPLANTABLE DEVICE N/A 03/11/2015   Procedure: Pacemaker Implant;  Surgeon: Will Meredith [name], MD;  [place];  Laterality: Left  . LAMINECTOMY  1969  . RIGHT/LEFT HEART CATH AND CORONARY ANGIOGRAPHY N/A 08/20/2016   Procedure: Right/Left Heart Cath and Coronary Angiography;  Surgeon: Burnell Blanks, MD;  Location: [facility] CV LAB;  Service: Cardiovascular;  Laterality: N/A;  . TEE WITHOUT CARDIOVERSION N/A 07/22/2016   Procedure: TRANSESOPHAGEAL ECHOCARDIOGRAM (TEE);  Surgeon: Skeet Latch, MD;  Location: [facility];  Service: Cardiovascular;  Laterality: N/A;  . TEE WITHOUT CARDIOVERSION N/A 08/31/2016   Procedure: TRANSESOPHAGEAL ECHOCARDIOGRAM (TEE);  Surgeon: Burnell Blanks, MD;  Location: [facility];  Service: Open Heart Surgery;  Laterality: N/A;  . TONSILLECTOMY  1964  . TRANSCATHETER AORTIC VALVE REPLACEMENT, TRANSFEMORAL N/A 08/31/2016   Procedure: TRANSCATHETER AORTIC VALVE REPLACEMENT, TRANSFEMORAL;  Surgeon: Burnell Blanks, MD;  Location: [facility];  Service: Open Heart Surgery;  Laterality: N/A;       Home Medications    Prior to Admission medications   Medication Sig Start Date End Date Taking? Authorizing Provider  acetaminophen (TYLENOL) 325 MG tablet Take 2 tablets (650 mg total) by mouth every 6 (six) hours as needed for mild pain. 09/02/16  Yes Nani Skillern, PA-C  aspirin EC 81 MG EC tablet Take 1 tablet (81 mg total) by mouth daily. 09/03/16  Yes Lars Pinks M, PA-C  atorvastatin (LIPITOR) 40 MG tablet Take 20 mg by mouth daily.    [provider]  b complex-vitamin c-folic acid (NEPHRO-VITE) 0.8 MG TABS Take 1 tablet by mouth daily.     [provider]  bromocriptine (PARLODEL) 5 MG capsule Take 5 mg by mouth at bedtime.  11/30/10   [provider]  calcium acetate (PHOSLO) 667 MG capsule Take 667 mg by mouth 4 (four) times daily.     [provider]  cetirizine (ZYRTEC) 10 MG tablet Take 10 mg by mouth at bedtime.     [provider]  dorzolamide-timolol (COSOPT) 22.3-6.8 MG/ML ophthalmic solution Place 1 drop into the right eye 2 (two) times daily. 08/13/13   [provider]  FLUoxetine (PROZAC) 20 MG capsule Take 40 mg by mouth daily.  11/30/10   [provider]  metoprolol tartrate (LOPRESSOR) 25 MG tablet TAKE 1 TABLET BY MOUTH TWICE DAILY 02/05/16   Camnitz, Ocie Doyne, MD  mupirocin ointment (BACTROBAN) 2 % Place 1 application into the nose 2 (two) times daily. Last dose is on 09/04/2016 09/02/16   Nani Skillern, PA-C  omeprazole (PRILOSEC) 20 MG capsule Take 20 mg by mouth daily.    [provider]  predniSONE (DELTASONE) 5 MG tablet Take 5 mg by mouth daily.  04/27/16   [provider]  SENSIPAR 30 MG tablet Take 1 tablet (30 mg total) by mouth  every other day. 09/02/16   Nani Skillern, PA-C  warfarin (COUMADIN) 5 MG tablet Take 1-1.5 tablets by mouth daily as directed by coumadin clinic 03/08/16   Skeet Latch, MD    Family History Family History  Problem Relation Age of Onset  . Stomach cancer Mother   . Hypertension Father        Died of heart attack  . Heart attack Father   . Stroke Sister   . Heart disease Sister   . Cancer Brother   . Colon cancer Neg Hx     Social History Social History  Substance Use Topics  . Smoking status: Former Smoker    Packs/day: 2.00    Years: 45.00    Types: Cigarettes    Quit date: 01/20/1998  . Smokeless tobacco: Never Used  . Alcohol use No     Allergies   Penicillins; Codeine; and Tramadol   Review of Systems Review of Systems  Constitutional: Negative for chills and fever.  HENT: Negative for congestion and sore throat.   Eyes: Negative for visual disturbance.  Respiratory: Negative  for cough and shortness of breath.   Cardiovascular: Negative for chest pain, palpitations and leg swelling.  Gastrointestinal: Negative for abdominal pain, diarrhea, nausea and vomiting.  Genitourinary: Negative for dysuria, flank pain, frequency, hematuria, scrotal swelling, testicular pain and urgency.  Musculoskeletal: Negative for arthralgias and myalgias.  Skin: Negative for rash.  Neurological: Positive for dizziness, syncope, light-headedness and headaches. Negative for weakness and numbness.  Psychiatric/Behavioral: Negative for sleep disturbance. The patient is not nervous/anxious.      Physical Exam Updated Vital Signs BP 127/77 (BP Location: Right Arm)   Pulse 92   Resp (!) 22   SpO2 100%   Physical Exam  Constitutional: He is oriented to person, place, and time. He appears well-developed and well-nourished.  Non-toxic appearance. No distress.  HENT:  Head: Normocephalic and atraumatic.  Nose: Nose normal.  Mouth/Throat: Oropharynx is clear and moist.  Eyes: Conjunctivae are normal. Pupils are equal, round, and reactive to light. Right eye exhibits no discharge. Left eye exhibits no discharge.  Neck: Normal range of motion. Neck supple. No JVD present. No tracheal deviation present.  Cardiovascular: Normal rate, normal heart sounds and intact distal pulses.  An irregularly irregular rhythm present. Exam reveals no gallop and no friction rub.   No murmur heard. Pulmonary/Chest: Effort normal and breath sounds normal. No respiratory distress. He exhibits no tenderness.  No hypoxia or tachypnea.  Abdominal: Soft. Bowel sounds are normal. He exhibits no distension. There is no tenderness. There is no rebound and no guarding.  Musculoskeletal: Normal range of motion.  No lower extremity edema or calf tenderness.  The fistula noted in left extremity with palpable thrill no signs of infection.  Lymphadenopathy:    He has no cervical adenopathy.  Neurological: He is alert and  oriented to person, place, and time.  The patient is alert, attentive, and oriented x 3. Speech is clear. Cranial nerve II-VII grossly intact. Negative pronator drift. Sensation intact. Strength 5/5 in all extremities. Reflexes 2+ and symmetric at biceps, triceps, knees, and ankles. Rapid alternating movement and fine finger movements intact. Romberg is absent. Posture and gait normal.   Skin: Skin is warm and dry. Capillary refill takes less than 2 seconds. He is not diaphoretic.  Psychiatric: His behavior is normal. Judgment and thought content normal.  Nursing note and vitals reviewed.    ED Treatments / Results  Labs (all labs  ordered are listed, but only abnormal results are displayed) Labs Reviewed  BASIC METABOLIC PANEL - Abnormal; Notable for the following:       Result Value   Chloride 98 (*)    Glucose, Bld 118 (*)    BUN 21 (*)    Creatinine, Ser 4.65 (*)    Calcium 8.7 (*)    GFR calc non Af Amer 11 (*)    GFR calc Af Amer 13 (*)    All other components within normal limits  CBC - Abnormal; Notable for the following:    RBC 3.19 (*)    Hemoglobin 10.5 (*)    HCT 32.8 (*)    MCV 102.8 (*)    RDW 18.4 (*)    All other components within normal limits  CBG MONITORING, ED  I-STAT TROPOININ, ED  I-STAT TROPOININ, ED    EKG  EKG Interpretation  Date/Time:  Tuesday September 21 2016 05:32:22 EDT Ventricular Rate:  82 PR Interval:    QRS Duration: 143 QT Interval:  445 QTC Calculation: 520 R Axis:   -35 Text Interpretation:  Atrial fibrillation Left bundle branch block Baseline wander in lead(s) V5 No significant change since last tracing Confirmed by Orpah Greek [phone]) on 09/21/2016 5:39:11 AM       Radiology Dg Chest 2 View  Result Date: 09/21/2016 CLINICAL DATA:  Lightheadedness and weakness tonight. EXAM: CHEST  2 VIEW COMPARISON:  08/31/2016 FINDINGS: Slight interstitial coarsening in the basilar periphery, probably chronic. No consolidation. No  effusion. Normal pulmonary vasculature. Unchanged cardiomegaly. Grossly intact transvenous cardiac leads. IMPRESSION: Stable cardiomegaly. Mild chronic appearing interstitial coarsening. No consolidation or effusion. Electronically Signed   By: Andreas Newport M.D.   On: 09/21/2016 06:38    Procedures Procedures (including critical care time)  Medications Ordered in ED Medications - No data to display   Initial Impression / Assessment and Plan / ED Course  I have reviewed the triage vital signs and the nursing notes.  Pertinent labs & imaging results that were available during my care of the patient were reviewed by me and considered in my medical decision making (see chart for details).     .Patient presents to the emergency department today after he had a near-syncopal episode in the lobby waiting for his wife in the ER. Daughter (the patient stood up very quickly and became lightheaded and dizzy and almost passed out. He was helped to the stretcher by ED physician and put into a room for evaluation. On exam patient is overall well-appearing. Vital signs are normal. Patient does have significant chronic medical history including A. fib on anticoagulation, congestive heart failure, end-stage renal disease currently on hemodialysis Monday, Wednesdays and Fridays. Was hospitalized last week for congestive heart failure. Was given Lasix and had extra day of dialysis. Pt had normal workup according to daughter. Patient denies any chest pain or shortness of breath. For the patient's symptoms were due to possible vasovagal after standing up too quickly. Patient also may be slightly dehydrated given her recent hospitalization, Lasix, dialysis. Orthostatics are not clearly positive. Patient has no focal neuro deficits. Patient was not given fluid given history of congestive heart failure. He does not appear to be fluid overload. Doubt any intracranial abnormalities. Electrolytes are within normal limits.  Elevated creatinine which is at his baseline. Patient is to be dialyzed tomorrow. Delta troponin is negative. EKG without any ischemic changes and is unchanged from prior tracings. I feel the patient is stable for discharge  with close follow-up with cardiology and primary care doctor. Patient seen and evaluated by Dr. Betsey Holiday who is agreeable with discharge. Daughter and patient are agreeable to the above plan. Have given strict return precautions. He is currently hemodynamically stable in no acute distress at discharge.   Final Clinical Impressions(s) / ED Diagnoses   Final diagnoses:  Near syncope    New Prescriptions New Prescriptions   No medications on file     Aaron Edelman 09/22/16 1718    Orpah Greek, MD 09/26/16 [phone]

## 2016-09-21 NOTE — ED Notes (Signed)
Family at bedside. 

## 2016-09-21 NOTE — ED Provider Notes (Signed)
Patient presented to the ER with near syncope  Face to face Exam: HEENT - PERRLA Lungs - CTAB Heart - RRR, no M/R/G Abd - S/NT/ND Neuro - alert, oriented x3  Plan: Patient had a dizzy spell after standing up after sitting for sometime in the waiting room with his wife who is being seen as a patient. He sat down hard on the floor but did not pass out. He was recently in the hospital for volume overload, likely is slightly dry secondary to increased removal of dialysis. Workup does not suggest any volume overload, acute coronary syndrome. She will be appropriate for discharge, follow-up with dialysis as scheduled.     Orpah Greek, MD 09/21/16 0800

## 2016-09-21 NOTE — ED Triage Notes (Signed)
Pt is in the emergency room with his wife, who is also a patient, when walking from the waiting room, pt sts he felt very weak, lightheaded, and "felt like I was going to pass out". Pt did fell to the floorin the lobby, and was helped onto the stretcher by nursing staff and Dr Waverly Ferrari. Pt denies LOC.

## 2016-09-21 NOTE — ED Notes (Addendum)
Pt is resting comfortably, denies any complaints at this time, denies feeling dizzy when orthostatic VS were obtained.  Pt is A&Ox 4.  Pt is made that a urine sample is needed.  Pt is a dialysis pt, will provide specimen if he can

## 2016-09-21 NOTE — ED Notes (Signed)
ED Provider at bedside. 

## 2016-09-22 DIAGNOSIS — D631 Anemia in chronic kidney disease: Secondary | ICD-10-CM | POA: Diagnosis not present

## 2016-09-22 DIAGNOSIS — N186 End stage renal disease: Secondary | ICD-10-CM | POA: Diagnosis not present

## 2016-09-22 DIAGNOSIS — E1129 Type 2 diabetes mellitus with other diabetic kidney complication: Secondary | ICD-10-CM | POA: Diagnosis not present

## 2016-09-22 DIAGNOSIS — N2581 Secondary hyperparathyroidism of renal origin: Secondary | ICD-10-CM | POA: Diagnosis not present

## 2016-09-22 DIAGNOSIS — I482 Chronic atrial fibrillation: Secondary | ICD-10-CM | POA: Diagnosis not present

## 2016-09-24 DIAGNOSIS — D631 Anemia in chronic kidney disease: Secondary | ICD-10-CM | POA: Diagnosis not present

## 2016-09-24 DIAGNOSIS — N186 End stage renal disease: Secondary | ICD-10-CM | POA: Diagnosis not present

## 2016-09-24 DIAGNOSIS — E1129 Type 2 diabetes mellitus with other diabetic kidney complication: Secondary | ICD-10-CM | POA: Diagnosis not present

## 2016-09-24 DIAGNOSIS — N2581 Secondary hyperparathyroidism of renal origin: Secondary | ICD-10-CM | POA: Diagnosis not present

## 2016-09-24 NOTE — Telephone Encounter (Signed)
Spoke with daughter yesterday and she was going to have recent hospitalization records faxed to office No records received at this time

## 2016-09-25 DIAGNOSIS — N186 End stage renal disease: Secondary | ICD-10-CM | POA: Diagnosis not present

## 2016-09-25 DIAGNOSIS — I4891 Unspecified atrial fibrillation: Secondary | ICD-10-CM | POA: Diagnosis not present

## 2016-09-25 DIAGNOSIS — I509 Heart failure, unspecified: Secondary | ICD-10-CM | POA: Diagnosis not present

## 2016-09-25 DIAGNOSIS — D631 Anemia in chronic kidney disease: Secondary | ICD-10-CM | POA: Diagnosis not present

## 2016-09-25 DIAGNOSIS — H3530 Unspecified macular degeneration: Secondary | ICD-10-CM | POA: Diagnosis not present

## 2016-09-25 DIAGNOSIS — F329 Major depressive disorder, single episode, unspecified: Secondary | ICD-10-CM | POA: Diagnosis not present

## 2016-09-25 DIAGNOSIS — E1129 Type 2 diabetes mellitus with other diabetic kidney complication: Secondary | ICD-10-CM | POA: Diagnosis not present

## 2016-09-25 DIAGNOSIS — Z992 Dependence on renal dialysis: Secondary | ICD-10-CM | POA: Diagnosis not present

## 2016-09-27 ENCOUNTER — Emergency Department (HOSPITAL_COMMUNITY)
Admission: EM | Admit: 2016-09-27 | Discharge: 2016-09-27 | Disposition: A | Discharge status: Home / Self Care | Payer: Medicare Other | Attending: Emergency Medicine | Admitting: Emergency Medicine

## 2016-09-27 ENCOUNTER — Emergency Department (HOSPITAL_COMMUNITY): Payer: Medicare Other

## 2016-09-27 ENCOUNTER — Ambulatory Visit (INDEPENDENT_AMBULATORY_CARE_PROVIDER_SITE_OTHER): Payer: Medicare Other | Admitting: Pharmacist

## 2016-09-27 ENCOUNTER — Encounter (HOSPITAL_COMMUNITY): Payer: Self-pay

## 2016-09-27 DIAGNOSIS — N2581 Secondary hyperparathyroidism of renal origin: Secondary | ICD-10-CM | POA: Diagnosis not present

## 2016-09-27 DIAGNOSIS — R55 Syncope and collapse: Secondary | ICD-10-CM | POA: Insufficient documentation

## 2016-09-27 DIAGNOSIS — Z7982 Long term (current) use of aspirin: Secondary | ICD-10-CM | POA: Diagnosis not present

## 2016-09-27 DIAGNOSIS — Z79899 Other long term (current) drug therapy: Secondary | ICD-10-CM | POA: Insufficient documentation

## 2016-09-27 DIAGNOSIS — Z8673 Personal history of transient ischemic attack (TIA), and cerebral infarction without residual deficits: Secondary | ICD-10-CM | POA: Diagnosis not present

## 2016-09-27 DIAGNOSIS — Z87891 Personal history of nicotine dependence: Secondary | ICD-10-CM | POA: Diagnosis not present

## 2016-09-27 DIAGNOSIS — N186 End stage renal disease: Secondary | ICD-10-CM | POA: Insufficient documentation

## 2016-09-27 DIAGNOSIS — H548 Legal blindness, as defined in USA: Secondary | ICD-10-CM | POA: Insufficient documentation

## 2016-09-27 DIAGNOSIS — S0990XA Unspecified injury of head, initial encounter: Secondary | ICD-10-CM | POA: Diagnosis not present

## 2016-09-27 DIAGNOSIS — E1129 Type 2 diabetes mellitus with other diabetic kidney complication: Secondary | ICD-10-CM | POA: Diagnosis not present

## 2016-09-27 DIAGNOSIS — Z947 Corneal transplant status: Secondary | ICD-10-CM | POA: Insufficient documentation

## 2016-09-27 DIAGNOSIS — E119 Type 2 diabetes mellitus without complications: Secondary | ICD-10-CM | POA: Diagnosis not present

## 2016-09-27 DIAGNOSIS — Z7901 Long term (current) use of anticoagulants: Secondary | ICD-10-CM | POA: Insufficient documentation

## 2016-09-27 DIAGNOSIS — S064X0A Epidural hemorrhage without loss of consciousness, initial encounter: Secondary | ICD-10-CM | POA: Diagnosis not present

## 2016-09-27 DIAGNOSIS — I480 Paroxysmal atrial fibrillation: Secondary | ICD-10-CM

## 2016-09-27 DIAGNOSIS — Z992 Dependence on renal dialysis: Secondary | ICD-10-CM | POA: Insufficient documentation

## 2016-09-27 DIAGNOSIS — I120 Hypertensive chronic kidney disease with stage 5 chronic kidney disease or end stage renal disease: Secondary | ICD-10-CM | POA: Insufficient documentation

## 2016-09-27 DIAGNOSIS — R42 Dizziness and giddiness: Secondary | ICD-10-CM | POA: Diagnosis present

## 2016-09-27 DIAGNOSIS — D631 Anemia in chronic kidney disease: Secondary | ICD-10-CM | POA: Diagnosis not present

## 2016-09-27 DIAGNOSIS — I951 Orthostatic hypotension: Secondary | ICD-10-CM | POA: Insufficient documentation

## 2016-09-27 LAB — BASIC METABOLIC PANEL
Anion gap: 9 (ref 5–15)
BUN: 14 mg/dL (ref 6–20)
CALCIUM: 8.2 mg/dL — AB (ref 8.9–10.3)
CO2: 30 mmol/L (ref 22–32)
CREATININE: 4.62 mg/dL — AB (ref 0.61–1.24)
Chloride: 97 mmol/L — ABNORMAL LOW (ref 101–111)
GFR, EST AFRICAN AMERICAN: 13 mL/min — AB (ref >60)
GFR, EST NON AFRICAN AMERICAN: 11 mL/min — AB (ref >60)
Glucose, Bld: 81 mg/dL (ref 65–99)
Potassium: 3.3 mmol/L — ABNORMAL LOW (ref 3.5–5.1)
SODIUM: 136 mmol/L (ref 135–145)

## 2016-09-27 LAB — CBC
HCT: 31.8 % — ABNORMAL LOW (ref 39.0–52.0)
Hemoglobin: 10 g/dL — ABNORMAL LOW (ref 13.0–17.0)
MCH: 32.8 pg (ref 26.0–34.0)
MCHC: 31.4 g/dL (ref 30.0–36.0)
MCV: 104.3 fL — AB (ref 78.0–100.0)
PLATELETS: 149 10*3/uL — AB (ref 150–400)
RBC: 3.05 MIL/uL — AB (ref 4.22–5.81)
RDW: 18.1 % — AB (ref 11.5–15.5)
WBC: 8.2 10*3/uL (ref 4.0–10.5)

## 2016-09-27 LAB — POCT INR: INR: 1.4

## 2016-09-27 MED ORDER — SODIUM CHLORIDE 0.9 % IV BOLUS (SEPSIS)
500.0000 mL | Freq: Once | INTRAVENOUS | Status: AC
  Administered 2016-09-27: 500 mL via INTRAVENOUS

## 2016-09-27 NOTE — ED Triage Notes (Signed)
Patient was at home after having dialysis today and got dizzy upon standing and passed out and fell.  Patein fell hit his head, no bleeding noted.  Patient has hx of this recurring.  Skin tear on the left wrist gauze applied by EMS. Patient is legally blind but can follow commands. A&O x 4

## 2016-09-27 NOTE — ED Notes (Signed)
Made pt aware that a urine sample is needed; Pt's family states that he does not make urine; MD aware

## 2016-09-27 NOTE — ED Notes (Signed)
Urine DC, pt does not make urine

## 2016-09-27 NOTE — ED Notes (Signed)
E-signature not available, pt verbalized understanding of DC instructions  

## 2016-09-27 NOTE — ED Notes (Signed)
Patient in CT. Will hang fluids when patient returns

## 2016-09-27 NOTE — ED Provider Notes (Signed)
[facility] DEPT Provider Note   CSN: [id number] Arrival date & time: 09/27/16  1701     History   Chief Complaint Chief Complaint  Patient presents with  . Dizziness  . Fall    on thinners     HPI Alexander [name] is a 76 y.o. male.  HPI Patient is a 76 year old male who presents emergency department after syncopal episode today.  He states he was at home and felt lightheaded and fell backwards when trying to stand up.  No one else in the family witnessed the fall.  Daughter found him on the floor.  He did have a small injury to his head and she believes they may have struck the coffee table.  Patient reports no loss of consciousness but the family member reports that he was somewhat dazed.  He denies pain in his upper lower extremities.  No chest pain shortness breath.  Denies abdominal pain.  He is a dialysis patient and had a normal run of dialysis today.  Family reports that he intermittently has these episodes of lightheadedness when standing   Past Medical History:  Diagnosis Date  . Anemia   . Aortic stenosis 06/15/12   TEE - EF 69-48%; grade 1 diastolic dysfunction; mild/mod aortic valve stenosis; Mitral valve had calcified annulus, mild pulm htn PA peak pressure 72mmHg  . Barrett's esophagus 05/2003  . Bradycardia 2017   [employer] 2240 Assurity dual-lead pacemaker  . Carpal tunnel syndrome, bilateral 11/03/2015  . Colon polyps   . CVA (cerebral infarction)    2004/affected left side  . Depression   . Diabetes mellitus without complication ([place])   . Diabetic peripheral neuropathy ([place]) 10/02/2015  . Diverticulosis   . Dyspnea    with exertion  . End stage renal disease ([place])    hemodialysis 3 times a week  . GERD (gastroesophageal reflux disease)   . Glaucoma   . Hyperlipidemia   . Hypertension   . Kidney stones   . Legally blind   . Macular degeneration    both eyes  . Orthostatic hypotension 09/09/2015  . Paroxysmal atrial fibrillation ([facility])   . Peptic  ulcer    bleeding, 1969  . Presence of permanent cardiac pacemaker   . S/P epidural steroid injection    last  injection over 10 years ago  . Seasonal allergies   . Tubular adenoma of colon 07/2001    Patient Active Problem List   Diagnosis Date Noted  . S/P TAVR (transcatheter aortic valve replacement) 08/31/2016  . Long term (current) use of anticoagulants [Z79.01] 08/13/2016  . Persistent atrial fibrillation ([place])   . Carpal tunnel syndrome, bilateral 11/03/2015  . Hand pain 10/02/2015  . Diabetic peripheral neuropathy ([place]) 10/02/2015  . Orthostatic hypotension 09/09/2015  . Bradycardia 03/11/2015  . End stage renal disease ([place]) 01/28/2015  . Left arm pain 01/28/2015  . Long-term (current) use of anticoagulants 04/08/2014  . Acute bronchitis 04/05/2014  . Hyperlipidemia 04/04/2014  . ESRD (end stage renal disease) on dialysis 03/13/2013  . PAD (peripheral artery disease) ([place]) 12/12/2012  . Chest pain 12/08/2012  . Severe aortic stenosis 06/15/2012  . Personal history of colonic polyps 02/03/2011  . Esophageal reflux 02/03/2011  . Anemia, unspecified 02/03/2011    Past Surgical History:  Procedure Laterality Date  . AV FISTULA PLACEMENT  2009  . BACK SURGERY    . CARDIOVERSION N/A 11/13/2015   Procedure: CARDIOVERSION;  Surgeon: Troy Sine, MD;  Location: [facility];  Service: Cardiovascular;  Laterality: N/A;  . CARDIOVERSION N/A 01/13/2016   Procedure: CARDIOVERSION;  Surgeon: Will Meredith [name], MD;  Location: [facility];  Service: Cardiovascular;  Laterality: N/A;  . [place]   right eye  . CYSTOSCOPY  several times   kidney stones  . EP IMPLANTABLE DEVICE N/A 03/11/2015   Procedure: Pacemaker Implant;  Surgeon: Will Meredith [name], MD;  [place];  Laterality: Left  . LAMINECTOMY  1969  . RIGHT/LEFT HEART CATH AND CORONARY ANGIOGRAPHY N/A 08/20/2016   Procedure: Right/Left Heart Cath and Coronary Angiography;   Surgeon: Burnell Blanks, MD;  Location: [facility];  Service: Cardiovascular;  Laterality: N/A;  . TEE WITHOUT CARDIOVERSION N/A 07/22/2016   Procedure: TRANSESOPHAGEAL ECHOCARDIOGRAM (TEE);  Surgeon: Skeet Latch, MD;  Location: [facility];  Service: Cardiovascular;  Laterality: N/A;  . TEE WITHOUT CARDIOVERSION N/A 08/31/2016   Procedure: TRANSESOPHAGEAL ECHOCARDIOGRAM (TEE);  Surgeon: Burnell Blanks, MD;  Location: [facility];  Service: Open Heart Surgery;  Laterality: N/A;  . TONSILLECTOMY  1964  . TRANSCATHETER AORTIC VALVE REPLACEMENT, TRANSFEMORAL N/A 08/31/2016   Procedure: TRANSCATHETER AORTIC VALVE REPLACEMENT, TRANSFEMORAL;  Surgeon: Burnell Blanks, MD;  Location: [facility];  Service: Open Heart Surgery;  Laterality: N/A;       Home Medications    Prior to Admission medications   Medication Sig Start Date End Date Taking? Authorizing Provider  acetaminophen (TYLENOL) 325 MG tablet Take 2 tablets (650 mg total) by mouth every 6 (six) hours as needed for mild pain. 09/02/16  Yes Nani Skillern, PA-C  aspirin EC 81 MG EC tablet Take 1 tablet (81 mg total) by mouth daily. 09/03/16  Yes Lars Pinks M, PA-C  atorvastatin (LIPITOR) 40 MG tablet Take 20 mg by mouth daily.   Yes [provider]  b complex-vitamin c-folic acid (NEPHRO-VITE) 0.8 MG TABS Take 1 tablet by mouth daily.    Yes [provider]  bromocriptine (PARLODEL) 5 MG capsule Take 5 mg by mouth at bedtime.  11/30/10  Yes [provider]  calcium acetate (PHOSLO) 667 MG capsule Take 667 mg by mouth 4 (four) times daily.    Yes [provider]  cetirizine (ZYRTEC) 10 MG tablet Take 10 mg by mouth at bedtime.    Yes [provider]  dorzolamide-timolol (COSOPT) 22.3-6.8 MG/ML ophthalmic solution Place 1 drop into the right eye 2 (two) times daily. 08/13/13  Yes [provider]  FLUoxetine (PROZAC) 20 MG capsule Take 40 mg by mouth daily.   11/30/10  Yes [provider]  metoprolol tartrate (LOPRESSOR) 25 MG tablet TAKE 1 TABLET BY MOUTH TWICE DAILY 02/05/16  Yes Camnitz, Will Hassell Done, MD  mupirocin ointment (BACTROBAN) 2 % Place 1 application into the nose 2 (two) times daily. Last dose is on 09/04/2016 09/02/16  Yes Tacy Dura, Donielle M, PA-C  omeprazole (PRILOSEC) 20 MG capsule Take 20 mg by mouth daily.   Yes [provider]  predniSONE (DELTASONE) 5 MG tablet Take 5 mg by mouth daily.  04/27/16  Yes [provider]  SENSIPAR 30 MG tablet Take 1 tablet (30 mg total) by mouth every other day. 09/02/16  Yes Lars Pinks M, PA-C  warfarin (COUMADIN) 5 MG tablet Take 1-1.5 tablets by mouth daily as directed by coumadin clinic Patient taking differently: Take 5-7.5 mg by mouth See admin instructions. Take 1-1.5 tablets by mouth daily as directed by coumadin clinic  Current dose is 5 mg (1  tablet) daily. 03/08/16  Yes Skeet Latch, MD    Family History Family History  Problem Relation Age of Onset  . Stomach cancer Mother   . Hypertension Father        Died of heart attack  . Heart attack Father   . Stroke Sister   . Heart disease Sister   . Cancer Brother   . Colon cancer Neg Hx     Social History Social History  Substance Use Topics  . Smoking status: Former Smoker    Packs/day: 2.00    Years: 45.00    Types: Cigarettes    Quit date: 01/20/1998  . Smokeless tobacco: Never Used  . Alcohol use No     Allergies   Penicillins; Codeine; and Tramadol   Review of Systems Review of Systems  All other systems reviewed and are negative.    Physical Exam Updated Vital Signs BP 108/71   Pulse 89   Temp 98.5 F (36.9 C) (Oral)   Resp 19   SpO2 94%   Physical Exam  Constitutional: He is oriented to person, place, and time. He appears well-developed and well-nourished.  HENT:  Head: Normocephalic.  Small posterior scalp hematoma.  No laceration.  Eyes: EOM are normal.  Neck:  Normal range of motion. Neck supple.  C-spine nontender.  Full range of motion.  C-spine cleared by Nexus criteria  Cardiovascular: Normal rate, regular rhythm, normal heart sounds and intact distal pulses.   Pulmonary/Chest: Effort normal and breath sounds normal. No respiratory distress.  Abdominal: Soft. He exhibits no distension. There is no tenderness.  Musculoskeletal:  Full range of motion of bilateral shoulders, elbows and wrists. Full range of motion of bilateral hips, knees and ankles.    Neurological: He is alert and oriented to person, place, and time.  Skin: Skin is warm and dry.  Psychiatric: He has a normal mood and affect. Judgment normal.  Nursing note and vitals reviewed.    ED Treatments / Results  Labs (all labs ordered are listed, but only abnormal results are displayed) Labs Reviewed  BASIC METABOLIC PANEL - Abnormal; Notable for the following:       Result Value   Potassium 3.3 (*)    Chloride 97 (*)    Creatinine, Ser 4.62 (*)    Calcium 8.2 (*)    GFR calc non Af Amer 11 (*)    GFR calc Af Amer 13 (*)    All other components within normal limits  CBC - Abnormal; Notable for the following:    RBC 3.05 (*)    Hemoglobin 10.0 (*)    HCT 31.8 (*)    MCV 104.3 (*)    RDW 18.1 (*)    Platelets 149 (*)    All other components within normal limits  CBG MONITORING, ED    EKG  EKG Interpretation None       Radiology Ct Head Wo Contrast  Result Date: 09/27/2016 CLINICAL DATA:  Syncope.  Fall and struck back of head. EXAM: CT HEAD WITHOUT CONTRAST TECHNIQUE: Contiguous axial images were obtained from the base of the skull through the vertex without intravenous contrast. COMPARISON:  10/05/2015 FINDINGS: Brain: There is no evidence for acute hemorrhage, hydrocephalus, mass lesion, or abnormal extra-axial fluid collection. No definite CT evidence for acute infarction. Old left parieto-occipital infarct again noted. Inferior left temporal lobe  encephalomalacia appears stable. Diffuse loss of parenchymal volume is consistent with atrophy. Patchy low attenuation in the deep hemispheric and periventricular white  matter is nonspecific, but likely reflects chronic microvascular ischemic demyelination. Vascular: No hyperdense vessel or unexpected calcification. Skull: No evidence for fracture. No worrisome lytic or sclerotic lesion. Sinuses/Orbits: The visualized paranasal sinuses and mastoid air cells are clear. Insert normal orbit Other: None. IMPRESSION: 1. Stable exam.  No acute intracranial abnormality. 2. Old left parieto-occipital infarct and inferior left temporal lobe encephalomalacia. 3. Atrophy with chronic small vessel white matter ischemic disease. Electronically Signed   By: Misty Stanley M.D.   On: 09/27/2016 18:59    Procedures Procedures (including critical care time)  Medications Ordered in ED Medications  sodium chloride 0.9 % bolus 500 mL (0 mLs Intravenous Stopped 09/27/16 1951)     Initial Impression / Assessment and Plan / ED Course  I have reviewed the triage vital signs and the nursing notes.  Pertinent labs & imaging results that were available during my care of the patient were reviewed by me and considered in my medical decision making (see chart for details).     Patient is overall well-appearing.  Patient's had intermittent single episodes for some time now.  Close primary care follow-up and follow-up with the dialysis team.  Workup in emergency department is without significant abnormality.  Final Clinical Impressions(s) / ED Diagnoses   Final diagnoses:  Syncope, unspecified syncope type  Orthostasis    New Prescriptions New Prescriptions   No medications on file     Jola Schmidt, MD 09/27/16 2102

## 2016-09-28 ENCOUNTER — Other Ambulatory Visit: Payer: Self-pay

## 2016-09-28 DIAGNOSIS — H3530 Unspecified macular degeneration: Secondary | ICD-10-CM | POA: Diagnosis not present

## 2016-09-28 DIAGNOSIS — N186 End stage renal disease: Secondary | ICD-10-CM | POA: Diagnosis not present

## 2016-09-28 DIAGNOSIS — I509 Heart failure, unspecified: Secondary | ICD-10-CM | POA: Diagnosis not present

## 2016-09-28 DIAGNOSIS — D631 Anemia in chronic kidney disease: Secondary | ICD-10-CM | POA: Diagnosis not present

## 2016-09-28 DIAGNOSIS — I4891 Unspecified atrial fibrillation: Secondary | ICD-10-CM | POA: Diagnosis not present

## 2016-09-28 DIAGNOSIS — F329 Major depressive disorder, single episode, unspecified: Secondary | ICD-10-CM | POA: Diagnosis not present

## 2016-09-28 LAB — CBG MONITORING, ED: Glucose-Capillary: 76 mg/dL (ref 65–99)

## 2016-09-28 NOTE — Patient Outreach (Signed)
[facility]) Care Management  09/28/2016  Alexander [name] Feb 14, 1941 [id number]   Telephone Screen  Referral Date: 09/28/16 Referral Source: C3 Team/[facility](Dr. Wilson Singer) Referral Reason: "Renal failure, pt has presented to ED 2x within last week, experiencing dizziness and falls in the home, needs home safety assessment, pt's spouse is currently being treated for cancer, daughter states they are not managing well at home" Insurance: Medicare   Outreach attempt # 1 to patient. No answer. RN CM left HIPAA compliant message along with contact info.       Plan: RN CM will make outreach attempt to patient within three business days if no return call.   Enzo Montgomery, RN,BSN,CCM [facility] Telephonic Care Management Coordinator Direct Phone: [phone] Toll Free: [phone] Fax: [phone]

## 2016-09-29 DIAGNOSIS — N2581 Secondary hyperparathyroidism of renal origin: Secondary | ICD-10-CM | POA: Diagnosis not present

## 2016-09-29 DIAGNOSIS — E1129 Type 2 diabetes mellitus with other diabetic kidney complication: Secondary | ICD-10-CM | POA: Diagnosis not present

## 2016-09-29 DIAGNOSIS — H3530 Unspecified macular degeneration: Secondary | ICD-10-CM | POA: Diagnosis not present

## 2016-09-29 DIAGNOSIS — I4891 Unspecified atrial fibrillation: Secondary | ICD-10-CM | POA: Diagnosis not present

## 2016-09-29 DIAGNOSIS — D631 Anemia in chronic kidney disease: Secondary | ICD-10-CM | POA: Diagnosis not present

## 2016-09-29 DIAGNOSIS — F329 Major depressive disorder, single episode, unspecified: Secondary | ICD-10-CM | POA: Diagnosis not present

## 2016-09-29 DIAGNOSIS — I509 Heart failure, unspecified: Secondary | ICD-10-CM | POA: Diagnosis not present

## 2016-09-29 DIAGNOSIS — N186 End stage renal disease: Secondary | ICD-10-CM | POA: Diagnosis not present

## 2016-09-30 ENCOUNTER — Other Ambulatory Visit: Payer: Self-pay

## 2016-09-30 DIAGNOSIS — D631 Anemia in chronic kidney disease: Secondary | ICD-10-CM | POA: Diagnosis not present

## 2016-09-30 DIAGNOSIS — F329 Major depressive disorder, single episode, unspecified: Secondary | ICD-10-CM | POA: Diagnosis not present

## 2016-09-30 DIAGNOSIS — I4891 Unspecified atrial fibrillation: Secondary | ICD-10-CM | POA: Diagnosis not present

## 2016-09-30 DIAGNOSIS — N186 End stage renal disease: Secondary | ICD-10-CM | POA: Diagnosis not present

## 2016-09-30 DIAGNOSIS — I509 Heart failure, unspecified: Secondary | ICD-10-CM | POA: Diagnosis not present

## 2016-09-30 DIAGNOSIS — H3530 Unspecified macular degeneration: Secondary | ICD-10-CM | POA: Diagnosis not present

## 2016-09-30 NOTE — Patient Outreach (Signed)
[facility]) Care Management  09/30/2016  Alexander [name] 1940/10/29 [id number]   Telephone Screen  Referral Date: 09/28/16 Referral Source: C3 Team/[facility](Dr. Wilson Singer) Referral Reason: "Renal failure, pt has presented to ED 2x within last week, experiencing dizziness and falls in the home, needs home safety assessment, pt's spouse is currently being treated for cancer, daughter states they are not managing well at home" Insurance: Medicare   Voicemail message received from patient's dtr-Alexander [name]. She state that patient really does not communicate over the phone due to a stroke he suffered years ago. She requested that RN CM call her back. ROI on file for dtr and outreach attempt made to dtr. Partial screening completed as dtr was driving and developed bad phone connection. She voiced that she would call RN CM back later.  Social: Patient resides in his home along with spouse. Spouse was recently diagnosed with breast cancer and had recent surgery,She also just recently began treatments for cancer. Per dtr spouse had been assisting patient out a lot. He requires some assistance with ADLs/IADLS. He does not drive. Family takes him back and forth to his appts. Dtr reports that patient has had two falls recently-one this week and the other one last week. She voices a total of four falls within the past year.   Conditions: Per records patient has PMH of aortic stenosis, CVA(2004), ESRD(M,W,F), DM w/ neuropathy, glaucoma, HLD, pacer, tubular adenoma of colon, CHF, Afib and legally blind.   Appointments: Dtr voices that patient's sees Dr. Shelia Media as PCP.    Plan: RN CM will make outreach attempt to patient/dtr within three business days if no return call. Enzo Montgomery, RN,BSN,CCM [facility] Telephonic Care Management Coordinator Direct Phone: [phone] Toll Free: [phone] Fax: [phone]

## 2016-10-01 DIAGNOSIS — N186 End stage renal disease: Secondary | ICD-10-CM | POA: Diagnosis not present

## 2016-10-01 DIAGNOSIS — N2581 Secondary hyperparathyroidism of renal origin: Secondary | ICD-10-CM | POA: Diagnosis not present

## 2016-10-01 DIAGNOSIS — D631 Anemia in chronic kidney disease: Secondary | ICD-10-CM | POA: Diagnosis not present

## 2016-10-01 DIAGNOSIS — E1129 Type 2 diabetes mellitus with other diabetic kidney complication: Secondary | ICD-10-CM | POA: Diagnosis not present

## 2016-10-04 ENCOUNTER — Other Ambulatory Visit: Payer: Self-pay

## 2016-10-04 ENCOUNTER — Ambulatory Visit (INDEPENDENT_AMBULATORY_CARE_PROVIDER_SITE_OTHER): Payer: Medicare Other | Admitting: Pharmacist

## 2016-10-04 DIAGNOSIS — I480 Paroxysmal atrial fibrillation: Secondary | ICD-10-CM | POA: Diagnosis not present

## 2016-10-04 DIAGNOSIS — Z7901 Long term (current) use of anticoagulants: Secondary | ICD-10-CM | POA: Diagnosis not present

## 2016-10-04 DIAGNOSIS — N2581 Secondary hyperparathyroidism of renal origin: Secondary | ICD-10-CM | POA: Diagnosis not present

## 2016-10-04 DIAGNOSIS — D631 Anemia in chronic kidney disease: Secondary | ICD-10-CM | POA: Diagnosis not present

## 2016-10-04 DIAGNOSIS — N186 End stage renal disease: Secondary | ICD-10-CM | POA: Diagnosis not present

## 2016-10-04 DIAGNOSIS — E1129 Type 2 diabetes mellitus with other diabetic kidney complication: Secondary | ICD-10-CM | POA: Diagnosis not present

## 2016-10-04 LAB — POCT INR: INR: 2.6

## 2016-10-04 NOTE — Patient Outreach (Signed)
[facility]) Care Management  10/04/2016  Alexander [name] 1940-04-15 [id number]   Telephone Screen  Referral Date: 09/28/16 Referral Source: C3 Team/[facility](Dr. Wilson Singer) Referral Reason: "Renal failure, pt has presented to ED 2x within last week, experiencing dizziness and falls in the home, needs home safety assessment, pt's spouse is currently being treated for cancer, daughter states they are not managing well at home" Insurance: Medicare   Outreach attempt #3 to patient/dtr. Spoke with dtr. States she is busy providing care to her mother and unable to talk at this time. She voices that she will call  RN CM back later.    Plan: RN CM will await return call from dtr. If no response from dtr will send unsuccessful outreach letter to patient.   Enzo Montgomery, RN,BSN,CCM [facility] Management Telephonic Care Management Coordinator Direct Phone: [phone] Toll Free: [phone] Fax: [phone]

## 2016-10-04 NOTE — Patient Outreach (Signed)
[facility]) Care Management  10/04/2016  Alexander [name] Jul 22, 1940 [id number]   Telephone Screen  Referral Date: 09/28/16 Referral Source: C3 Team/[facility](Dr. Wilson Singer) Referral Reason: "Renal failure, pt has presented to ED 2x within last week, experiencing dizziness and falls in the home, needs home safety assessment, pt's spouse is currently being treated for cancer, daughter states they are not managing well at home" Insurance: Medicare    Incoming call from patient's dtr-Alexander [name]. Remainder of screening call completed with dtr but she could only speak briefly with RN CM.   Social: Patient resides in his home along with spouse. Spouse was recently diagnosed with breast cancer and had recent surgery.She also just recently began treatments for cancer. Per dtr spouse had been assisting patient out a lot. He requires some assistance with ADLs/IADLS. He does not drive. Family takes him back and forth to his appts. Dtr reports that patient has had two falls recently-one this week and the other one last week. She voices a total of four falls within the past year.  Conditions: Per records patient has PMH of aortic stenosis, CVA(2004), ESRD(M,W,F), DM w/ neuropathy, glaucoma, HLD, pacer, tubular adenoma of colon, CHF, Afib and legally blind. Patient needs further education and support in managing all of his chronic conditions.  Medications: Dtr reports that patient is taking 14 meds. She denies any issues with affording and or managing meds at this time. Spouse is still able to fill med planner weekly for patient.  Appointments: Dtr voices that patient's sees Dr. Shelia Media as PCP. He is also followed by Dr. Barbra Sarks), Dr. Jonelle Sidle Omro(cardiologist), Dr. Tivis Ringer on 10/07/16-9:30am for echocardiogram, Dr, Ariel(joint specialist) and sees eye specialist at [facility].   Advance Directives: Dtr reports that patient has living will and [place]. She voices  that she is HC POA. No copy on chart.   Consent: [facility] services reviewed and discussed. Dtr gave verbal consent for [facility] services. She requests that she be contacted to coordinate visits and calls as patient she handles all of his affairs. She reports that she will be out of town from 10/06/16 until 10/09/16 and prefer home visit to occur when she returns to town.   Plan: RN CM will notify [facility] administrative assistant of case status. RN CM will send referral to [facility] RN for further in home eval/assessment of care needs and management of chronic conditions. RN CM will send [facility] pharmacy referral for polypharmacy med review.   Enzo Montgomery, RN,BSN,CCM [place] Management Telephonic Care Management Coordinator Direct Phone: [phone] Toll Free: [phone] Fax: [phone]

## 2016-10-05 DIAGNOSIS — H3530 Unspecified macular degeneration: Secondary | ICD-10-CM | POA: Diagnosis not present

## 2016-10-05 DIAGNOSIS — I509 Heart failure, unspecified: Secondary | ICD-10-CM | POA: Diagnosis not present

## 2016-10-05 DIAGNOSIS — D631 Anemia in chronic kidney disease: Secondary | ICD-10-CM | POA: Diagnosis not present

## 2016-10-05 DIAGNOSIS — I4891 Unspecified atrial fibrillation: Secondary | ICD-10-CM | POA: Diagnosis not present

## 2016-10-05 DIAGNOSIS — F329 Major depressive disorder, single episode, unspecified: Secondary | ICD-10-CM | POA: Diagnosis not present

## 2016-10-05 DIAGNOSIS — N186 End stage renal disease: Secondary | ICD-10-CM | POA: Diagnosis not present

## 2016-10-06 DIAGNOSIS — I509 Heart failure, unspecified: Secondary | ICD-10-CM | POA: Diagnosis not present

## 2016-10-06 DIAGNOSIS — N2581 Secondary hyperparathyroidism of renal origin: Secondary | ICD-10-CM | POA: Diagnosis not present

## 2016-10-06 DIAGNOSIS — N186 End stage renal disease: Secondary | ICD-10-CM | POA: Diagnosis not present

## 2016-10-06 DIAGNOSIS — H3530 Unspecified macular degeneration: Secondary | ICD-10-CM | POA: Diagnosis not present

## 2016-10-06 DIAGNOSIS — I4891 Unspecified atrial fibrillation: Secondary | ICD-10-CM | POA: Diagnosis not present

## 2016-10-06 DIAGNOSIS — E1129 Type 2 diabetes mellitus with other diabetic kidney complication: Secondary | ICD-10-CM | POA: Diagnosis not present

## 2016-10-06 DIAGNOSIS — F329 Major depressive disorder, single episode, unspecified: Secondary | ICD-10-CM | POA: Diagnosis not present

## 2016-10-06 DIAGNOSIS — D631 Anemia in chronic kidney disease: Secondary | ICD-10-CM | POA: Diagnosis not present

## 2016-10-07 ENCOUNTER — Other Ambulatory Visit: Payer: Self-pay

## 2016-10-07 ENCOUNTER — Ambulatory Visit (INDEPENDENT_AMBULATORY_CARE_PROVIDER_SITE_OTHER): Payer: Medicare Other | Admitting: Cardiovascular Disease

## 2016-10-07 ENCOUNTER — Encounter: Payer: Self-pay | Admitting: Cardiovascular Disease

## 2016-10-07 ENCOUNTER — Ambulatory Visit (HOSPITAL_COMMUNITY): Payer: Medicare Other | Attending: Cardiology

## 2016-10-07 VITALS — BP 110/80 | HR 80 | Ht 70.5 in | Wt 203.0 lb

## 2016-10-07 DIAGNOSIS — H3530 Unspecified macular degeneration: Secondary | ICD-10-CM | POA: Diagnosis not present

## 2016-10-07 DIAGNOSIS — I509 Heart failure, unspecified: Secondary | ICD-10-CM | POA: Diagnosis not present

## 2016-10-07 DIAGNOSIS — Z952 Presence of prosthetic heart valve: Secondary | ICD-10-CM

## 2016-10-07 DIAGNOSIS — Z09 Encounter for follow-up examination after completed treatment for conditions other than malignant neoplasm: Secondary | ICD-10-CM | POA: Diagnosis not present

## 2016-10-07 DIAGNOSIS — N186 End stage renal disease: Secondary | ICD-10-CM | POA: Diagnosis not present

## 2016-10-07 DIAGNOSIS — F329 Major depressive disorder, single episode, unspecified: Secondary | ICD-10-CM | POA: Diagnosis not present

## 2016-10-07 DIAGNOSIS — I350 Nonrheumatic aortic (valve) stenosis: Secondary | ICD-10-CM

## 2016-10-07 DIAGNOSIS — I4891 Unspecified atrial fibrillation: Secondary | ICD-10-CM | POA: Diagnosis not present

## 2016-10-07 DIAGNOSIS — D631 Anemia in chronic kidney disease: Secondary | ICD-10-CM | POA: Diagnosis not present

## 2016-10-07 DIAGNOSIS — I482 Chronic atrial fibrillation: Secondary | ICD-10-CM | POA: Diagnosis not present

## 2016-10-07 NOTE — Patient Instructions (Addendum)
Medication Instructions:  Your physician recommends that you continue on your current medications as directed. Please refer to the Current Medication list given to you today.   Labwork: none  Testing/Procedures: Your physician has requested that you have an echocardiogram. Echocardiography is a painless test that uses sound waves to create images of your heart. It provides your doctor with information about the size and shape of your heart and how well your heart's chambers and valves are working. This procedure takes approximately one hour. There are no restrictions for this procedure.  To be done in 11 months on day of appointment with Dr. Angelena Form.  We will call you to schedule this appointment  Follow-Up: Follow up with [name] as planned on August 16,2018  Your physician recommends that you schedule a follow-up appointment in: 11 months with Dr. Angelena Form.  We will contact you to schedule this appointment.     Any Other Special Instructions Will Be Listed Below (If Applicable).     If you need a refill on your cardiac medications before your next appointment, please call your pharmacy.

## 2016-10-07 NOTE — Progress Notes (Signed)
Chief Complaint  Patient presents with  . Follow-up    TAVR   History of Present Illness: 76 yo male with history of bradycardia s/p pacemaker placement, DM, HTN, HLD, ESRD on HD, PAF, prior CVA, carotid artery disease and severe aortic stenosis who is here today for one month TAVR follow up. He underwent placement of a 29 mm Sapien 3 bioprosthetic AVR from the right transfemoral approach on 08/31/16. He was discharged on ASA and coumadin after an uncomplicated post-op course. He was admitted to [facility] with volume overload on 09/13/16 and had extra fluid removed with dialysis. He was told he had evidence of fluid in his lungs. Following aggressive dialysis 5 times at [facility], he became weak and dizzy. He has been seen twice over the last two weeks in the [facility] ED with dizziness and near syncope resulting in falls, felt to be due to hypovolemia. There had been some confusion regarding his metoprolol and he had taken midodrine instead. This has been corrected.   He is here today for follow up. The patient denies any chest pain, palpitations, lower extremity edema, orthopnea, PND. His family states that he has been dyspneic with minimal exertion. They are trying to get his dry weight regulated at HD. He has no LE edema. Overall he feels better than before his TAVR.    Primary Care Physician: Deland Pretty, MD Primary Cardiologist: Oval Linsey   Past Medical History:  Diagnosis Date  . Anemia   . Aortic stenosis 06/15/12   TEE - EF 10-93%; grade 1 diastolic dysfunction; mild/mod aortic valve stenosis; Mitral valve had calcified annulus, mild pulm htn PA peak pressure 67mmHg  . Barrett's esophagus 05/2003  . Bradycardia 2017   [employer] 2240 Assurity dual-lead pacemaker  . Carpal tunnel syndrome, bilateral 11/03/2015  . Colon polyps   . CVA (cerebral infarction)    2004/affected left side  . Depression   . Diabetes mellitus without complication ([place])   . Diabetic  peripheral neuropathy ([place]) 10/02/2015  . Diverticulosis   . Dyspnea    with exertion  . End stage renal disease ([place])    hemodialysis 3 times a week  . GERD (gastroesophageal reflux disease)   . Glaucoma   . Hyperlipidemia   . Hypertension   . Kidney stones   . Legally blind   . Macular degeneration    both eyes  . Orthostatic hypotension 09/09/2015  . Paroxysmal atrial fibrillation (HCC)   . Peptic ulcer    bleeding, 1969  . Presence of permanent cardiac pacemaker   . S/P epidural steroid injection    last  injection over 10 years ago  . Seasonal allergies   . Tubular adenoma of colon 07/2001    Past Surgical History:  Procedure Laterality Date  . AV FISTULA PLACEMENT  2009  . BACK SURGERY    . CARDIOVERSION N/A 11/13/2015   Procedure: CARDIOVERSION;  Surgeon: Troy Sine, MD;  Location: [facility] ENDOSCOPY;  Service: Cardiovascular;  Laterality: N/A;  . CARDIOVERSION N/A 01/13/2016   Procedure: CARDIOVERSION;  Surgeon: Will Meredith [name], MD;  Location: [place];  Service: Cardiovascular;  Laterality: N/A;  . [place]   right eye  . CYSTOSCOPY  several times   kidney stones  . EP IMPLANTABLE DEVICE N/A 03/11/2015   Procedure: Pacemaker Implant;  Surgeon: Will Meredith [name], MD;  [place];  Laterality: Left  . LAMINECTOMY  1969  . RIGHT/LEFT HEART  CATH AND CORONARY ANGIOGRAPHY N/A 08/20/2016   Procedure: Right/Left Heart Cath and Coronary Angiography;  Surgeon: Burnell Blanks, MD;  Location: [place] CV LAB;  Service: Cardiovascular;  Laterality: N/A;  . TEE WITHOUT CARDIOVERSION N/A 07/22/2016   Procedure: TRANSESOPHAGEAL ECHOCARDIOGRAM (TEE);  Surgeon: Skeet Latch, MD;  Location: [place];  Service: Cardiovascular;  Laterality: N/A;  . TEE WITHOUT CARDIOVERSION N/A 08/31/2016   Procedure: TRANSESOPHAGEAL ECHOCARDIOGRAM (TEE);  Surgeon: Burnell Blanks, MD;  Location: [place];  Service: Open Heart Surgery;   Laterality: N/A;  . TONSILLECTOMY  1964  . TRANSCATHETER AORTIC VALVE REPLACEMENT, TRANSFEMORAL N/A 08/31/2016   Procedure: TRANSCATHETER AORTIC VALVE REPLACEMENT, TRANSFEMORAL;  Surgeon: Burnell Blanks, MD;  Location: [place];  Service: Open Heart Surgery;  Laterality: N/A;    Current Outpatient Prescriptions  Medication Sig Dispense Refill  . acetaminophen (TYLENOL) 325 MG tablet Take 2 tablets (650 mg total) by mouth every 6 (six) hours as needed for mild pain.    [name] atorvastatin (LIPITOR) 40 MG tablet Take 20 mg by mouth daily.    [name] b complex-vitamin c-folic acid (NEPHRO-VITE) 0.8 MG TABS Take 1 tablet by mouth daily.     . bromocriptine (PARLODEL) 5 MG capsule Take 5 mg by mouth at bedtime.     . calcium acetate (PHOSLO) 667 MG capsule Take 667 mg by mouth 4 (four) times daily.     . cetirizine (ZYRTEC) 10 MG tablet Take 10 mg by mouth at bedtime.     . dorzolamide-timolol (COSOPT) 22.3-6.8 MG/ML ophthalmic solution Place 1 drop into the right eye 2 (two) times daily.    [name] FLUoxetine (PROZAC) 20 MG capsule Take 40 mg by mouth daily.     . metoprolol tartrate (LOPRESSOR) 50 MG tablet Take 50 mg by mouth daily.  0  . mupirocin ointment (BACTROBAN) 2 % Place 1 application into the nose 2 (two) times daily. Last dose is on 09/04/2016 15 g 0  . omeprazole (PRILOSEC) 20 MG capsule Take 20 mg by mouth daily.    . predniSONE (DELTASONE) 5 MG tablet Take 5 mg by mouth daily.   1  . SENSIPAR 30 MG tablet Take 1 tablet (30 mg total) by mouth every other day. 60 tablet 5  . warfarin (COUMADIN) 5 MG tablet Take 1-1.5 tablets by mouth daily as directed by coumadin clinic (Patient taking differently: Take 5-7.5 mg by mouth See admin instructions. Take 1-1.5 tablets by mouth daily as directed by coumadin clinic  Current dose is 5 mg (1 tablet) daily.) 120 tablet 1  . aspirin EC 81 MG EC tablet Take 1 tablet (81 mg total) by mouth daily. (Patient not taking: Reported on 10/07/2016)     No current  facility-administered medications for this visit.     Allergies  Allergen Reactions  . Codeine Nausea Only  . Penicillins Swelling      . Tramadol Nausea Only    Social History   Social History  . Marital status: Married    Spouse name: N/A  . Number of children: 3  . Years of education: 14   Occupational History  . 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 Smoking status: Former Smoker    Packs/day: 2.00    Years: 45.00    Types: Cigarettes    Quit date: 01/20/1998  . Smokeless tobacco: Never Used  . Alcohol use No  . Drug use: No  . Sexual activity: Yes   Other Topics Concern  .  Not on file   Social History Narrative   Lives at home w/ his wife   Right-handed   Drinks 1 cup of coffee per day    Family History  Problem Relation Age of Onset  . Stomach cancer Mother   . Hypertension Father        Died of heart attack  . Heart attack Father   . Stroke Sister   . Heart disease Sister   . Cancer Brother   . Colon cancer Neg Hx     Review of Systems:  As stated in the HPI and otherwise negative.   BP 110/80   Pulse 80   Ht 5' 10.5" (1.791 m)   Wt 203 lb (92.1 kg)   SpO2 98%   BMI 28.72 kg/m   Physical Examination:  General: Well developed, well nourished, NAD  HEENT: OP clear, mucus membranes moist  SKIN: warm, dry. No rashes. Neuro: No focal deficits  Musculoskeletal: Muscle strength 5/5 all ext  Psychiatric: Mood and affect normal  Neck: No JVD, no carotid bruits, no thyromegaly, no lymphadenopathy.  Lungs:Clear bilaterally, no wheezes, rhonci, crackles Cardiovascular: Regular rate and rhythm. No murmurs, gallops or rubs. Abdomen:Soft. Bowel sounds present. Non-tender.  Extremities: No lower extremity edema. Pulses are 2 + in the bilateral DP/PT.  Echo 10/07/16: Left ventricle: The cavity size was normal. Wall thickness was   increased in a pattern of mild LVH. Systolic function was normal.   The estimated ejection  fraction was in the range of 55% to 60%.   Wall motion was normal; there were no regional wall motion   abnormalities. Doppler parameters are consistent with high   ventricular filling pressure. - Aortic valve: A bioprosthesis was present. - Mitral valve: Calcified annulus. There was mild regurgitation.   Valve area by pressure half-time: 2.08 cm^2. - Left atrium: The atrium was mildly dilated. - Right atrium: The atrium was moderately dilated. - Pulmonary arteries: Systolic pressure was mildly increased. PA   peak pressure: 36 mm Hg (S).  Impressions:  - Normal LV systolic function; mild LVH; elevated LV filling   pressure; s/p AVR with normal mean gradient (9 mmHg) and   calculated AVA of 1.4 cm2; no AI; mild MR; biatrial enlargement;   mild TR with mildly elevated pulmonary pressure.   EKG:  EKG is not ordered today.  Recent Labs: 08/31/2016: ALT 24 09/01/2016: Magnesium 1.6 09/27/2016: BUN 14; Creatinine, Ser 4.62; Hemoglobin 10.0; Platelets 149; Potassium 3.3; Sodium 136   Lipid Panel    Component Value Date/Time   CHOL 138 10/28/2015 1008   TRIG 158 (H) 10/28/2015 1008   HDL 30 (L) 10/28/2015 1008   CHOLHDL 4.6 10/28/2015 1008   VLDL 32 (H) 10/28/2015 1008   LDLCALC 76 10/28/2015 1008     Wt Readings from Last 3 Encounters:  10/07/16 203 lb (92.1 kg)  09/02/16 206 lb 8 oz (93.7 kg)  08/27/16 210 lb (95.3 kg)     Other studies Reviewed: Additional studies/ records that were reviewed today include: Echo images Review of the above records demonstrates: severe AS  Assessment and Plan:   1. Severe aortic valve stenosis: He is now one month s/p TAVR. He is doing well overall. He is NYHA class 2-3 but I think his dyspnea is mostly related to volume issues which are controlled in dialysis. He also has chronic persistent atrial fibrillation. Echo today shows normally functioning aortic valve with normal LV function. His BP is stable. He has no  LE edema. Will continue  coumadin. Will continue beta blocker.    Current medicines are reviewed at length with the patient today.  The patient does not have concerns regarding medicines.  The following changes have been made:  no change  Labs/ tests ordered today include:   No orders of the defined types were placed in this encounter.    Disposition:   Follow up with [name] in 4-6 weeks and me in one year with echo.    Signed, Lauree Chandler, MD 10/07/2016 6:25 PM    [address], [place], [place]  [address] Phone: [phone]; Fax: [phone]

## 2016-10-07 NOTE — Telephone Encounter (Signed)
refaxed medical release for daughter brought to [facility]

## 2016-10-08 DIAGNOSIS — N186 End stage renal disease: Secondary | ICD-10-CM | POA: Diagnosis not present

## 2016-10-08 DIAGNOSIS — E1129 Type 2 diabetes mellitus with other diabetic kidney complication: Secondary | ICD-10-CM | POA: Diagnosis not present

## 2016-10-08 DIAGNOSIS — H3530 Unspecified macular degeneration: Secondary | ICD-10-CM | POA: Diagnosis not present

## 2016-10-08 DIAGNOSIS — D631 Anemia in chronic kidney disease: Secondary | ICD-10-CM | POA: Diagnosis not present

## 2016-10-08 DIAGNOSIS — F329 Major depressive disorder, single episode, unspecified: Secondary | ICD-10-CM | POA: Diagnosis not present

## 2016-10-08 DIAGNOSIS — I4891 Unspecified atrial fibrillation: Secondary | ICD-10-CM | POA: Diagnosis not present

## 2016-10-08 DIAGNOSIS — I509 Heart failure, unspecified: Secondary | ICD-10-CM | POA: Diagnosis not present

## 2016-10-08 DIAGNOSIS — N2581 Secondary hyperparathyroidism of renal origin: Secondary | ICD-10-CM | POA: Diagnosis not present

## 2016-10-11 ENCOUNTER — Encounter: Payer: Self-pay | Admitting: *Deleted

## 2016-10-11 ENCOUNTER — Other Ambulatory Visit: Payer: Self-pay | Admitting: *Deleted

## 2016-10-11 ENCOUNTER — Ambulatory Visit (INDEPENDENT_AMBULATORY_CARE_PROVIDER_SITE_OTHER): Payer: Medicare Other | Admitting: Pharmacist Clinician (PhC)/ Clinical Pharmacy Specialist

## 2016-10-11 DIAGNOSIS — I480 Paroxysmal atrial fibrillation: Secondary | ICD-10-CM

## 2016-10-11 DIAGNOSIS — Z7901 Long term (current) use of anticoagulants: Secondary | ICD-10-CM | POA: Diagnosis not present

## 2016-10-11 DIAGNOSIS — N2581 Secondary hyperparathyroidism of renal origin: Secondary | ICD-10-CM | POA: Diagnosis not present

## 2016-10-11 DIAGNOSIS — E1129 Type 2 diabetes mellitus with other diabetic kidney complication: Secondary | ICD-10-CM | POA: Diagnosis not present

## 2016-10-11 DIAGNOSIS — D631 Anemia in chronic kidney disease: Secondary | ICD-10-CM | POA: Diagnosis not present

## 2016-10-11 DIAGNOSIS — N186 End stage renal disease: Secondary | ICD-10-CM | POA: Diagnosis not present

## 2016-10-11 LAB — POCT INR: INR: 2.7

## 2016-10-11 NOTE — Patient Outreach (Signed)
[place] [facility]) Care Management [place] Telephone Outreach, Telephone Outreach  10/11/2016  TYLER CUBIT 01-08-1941 [id number]  Successful telephone outreach to Alexander [name], daughter/ caregiver of Alexander [name], 76 y/o male referred to [place] from [facility] telephonic RN CM MD referral.  Patient has history including, but not limited to, PAD, Aortic stenosis with TAVR 08/31/16, ESRD on HD (M, W, F), A-Fib on coumadin, DM, CVA in 2004, CHF, macular degeneration with legal blindness, and multiple falls.  HIPAA/ identity verified with patient's daughter during phone call today.  Today, [name] immediately said that she was unable to complete today's phone call, as she was just lying down for a quick nap, as she had been at the hospital all night last night with her mother.  [name] requested that I return the call later this week during afternoon hours.  Plan:  Will re-attempt telephone outreach to patient's caregiver/ daughter later this week.  [name], RN, BSN, [employer] [facility] Care Management  [phone]

## 2016-10-12 DIAGNOSIS — H3530 Unspecified macular degeneration: Secondary | ICD-10-CM | POA: Diagnosis not present

## 2016-10-12 DIAGNOSIS — D631 Anemia in chronic kidney disease: Secondary | ICD-10-CM | POA: Diagnosis not present

## 2016-10-12 DIAGNOSIS — I4891 Unspecified atrial fibrillation: Secondary | ICD-10-CM | POA: Diagnosis not present

## 2016-10-12 DIAGNOSIS — I509 Heart failure, unspecified: Secondary | ICD-10-CM | POA: Diagnosis not present

## 2016-10-12 DIAGNOSIS — N186 End stage renal disease: Secondary | ICD-10-CM | POA: Diagnosis not present

## 2016-10-12 DIAGNOSIS — F329 Major depressive disorder, single episode, unspecified: Secondary | ICD-10-CM | POA: Diagnosis not present

## 2016-10-13 ENCOUNTER — Encounter: Payer: Self-pay | Admitting: *Deleted

## 2016-10-13 ENCOUNTER — Other Ambulatory Visit: Payer: Self-pay | Admitting: *Deleted

## 2016-10-13 DIAGNOSIS — N2581 Secondary hyperparathyroidism of renal origin: Secondary | ICD-10-CM | POA: Diagnosis not present

## 2016-10-13 DIAGNOSIS — N186 End stage renal disease: Secondary | ICD-10-CM | POA: Diagnosis not present

## 2016-10-13 DIAGNOSIS — E1129 Type 2 diabetes mellitus with other diabetic kidney complication: Secondary | ICD-10-CM | POA: Diagnosis not present

## 2016-10-13 DIAGNOSIS — D631 Anemia in chronic kidney disease: Secondary | ICD-10-CM | POA: Diagnosis not present

## 2016-10-13 NOTE — Patient Outreach (Signed)
[place] [facility]) Care Management [place] Telephone Outreach  10/13/2016  [name] 1941-03-15 [id number]  Unsuccessful telephone outreach to [name], daughter/ caregiver of Alexander [name], 76 y/o male referred to [place] from [facility] telephonic RN CM MD referral.  Patient has history including, but not limited to, PAD, Aortic stenosis with TAVR 08/31/16, ESRD on HD (M, W, F), A-Fib on coumadin, DM, CVA in 2004, CHF, macular degeneration with legal blindness, and multiple falls.  HIPAA compliant voice mail message left for patient's caregiver, requesting return call back.  Plan:  Will re-attempt [place] telephone outreach later this week if I do not hear back from patient's caregiver first.  [name], RN, BSN, [place] Coordinator [facility] Care Management  [phone]

## 2016-10-14 ENCOUNTER — Telehealth: Payer: Self-pay | Admitting: Cardiovascular Disease

## 2016-10-14 DIAGNOSIS — I4891 Unspecified atrial fibrillation: Secondary | ICD-10-CM | POA: Diagnosis not present

## 2016-10-14 DIAGNOSIS — F329 Major depressive disorder, single episode, unspecified: Secondary | ICD-10-CM | POA: Diagnosis not present

## 2016-10-14 DIAGNOSIS — N186 End stage renal disease: Secondary | ICD-10-CM | POA: Diagnosis not present

## 2016-10-14 DIAGNOSIS — D631 Anemia in chronic kidney disease: Secondary | ICD-10-CM | POA: Diagnosis not present

## 2016-10-14 DIAGNOSIS — I509 Heart failure, unspecified: Secondary | ICD-10-CM | POA: Diagnosis not present

## 2016-10-14 DIAGNOSIS — H3530 Unspecified macular degeneration: Secondary | ICD-10-CM | POA: Diagnosis not present

## 2016-10-14 NOTE — Telephone Encounter (Signed)
Received records from [facility] for appointment on 11/11/16 with Dr Oval Linsey.  Records put with Dr Blenda Mounts schedule for 11/11/16. lp

## 2016-10-15 ENCOUNTER — Encounter: Payer: Self-pay | Admitting: *Deleted

## 2016-10-15 ENCOUNTER — Other Ambulatory Visit: Payer: Self-pay | Admitting: *Deleted

## 2016-10-15 DIAGNOSIS — E1129 Type 2 diabetes mellitus with other diabetic kidney complication: Secondary | ICD-10-CM | POA: Diagnosis not present

## 2016-10-15 DIAGNOSIS — N2581 Secondary hyperparathyroidism of renal origin: Secondary | ICD-10-CM | POA: Diagnosis not present

## 2016-10-15 DIAGNOSIS — D631 Anemia in chronic kidney disease: Secondary | ICD-10-CM | POA: Diagnosis not present

## 2016-10-15 DIAGNOSIS — N186 End stage renal disease: Secondary | ICD-10-CM | POA: Diagnosis not present

## 2016-10-15 NOTE — Patient Outreach (Signed)
[place] [facility]) Care Management [place] Telephone Outreach  10/15/2016  ERRIC MACHNIK 1941-02-17 [id number]  Successful telephone outreach to [name], daughter/ caregiver of [place], [place] y/o male referred to [place] from [facility] telephonic RN CM MD referral. Patient has history including, but not limited to, PAD, Aortic stenosis with TAVR 08/31/16, ESRD on HD (M, W, F), A-Fib on coumadin, DM, CVA in 2004, CHF, macular degeneration with legal blindness, and multiple falls. Patient's daughter had previously provided verbal consent for [facility] CM involvement in patient's care and requested that she be contacted for all [facility] CM follow up calls, as patient is [facility] and visually challenged, and she is primary caregiver for both of her parents.  HIPAA/ identity verified through [name] during phone call today.  [facility] CM services were again discussed with caregiver/ daughter, and she again provides verbal consent for [facility] CM involvement in patient's care.  Today, [name] reports that patient's wife, her mother, remains in the hospital but is expected to be discharged soon; [name] states that she does not have much time to talk today, as she is on the way home from hospital and is in process of facilitating her mother's hospital discharge.  [name] states that she has been quite overwhelmed trying to take care of her mother in the hospital, and her father at home, and that she has been staying at the hospital "all the time" while other family members assist with patient's (her father's) care. Emotional support and encouragment were provided to patient's caregiver.  Medications: -- caregiver reports patient has all medicationsand takes as prescribed with assistance from family;denies specific questions about current medications, note [place] referral is pending.  -- caregiver unable to complete medication reconciliation today, citing time constraints around her mother's  pending hospital discharge, and as she is not currently at patient's home  Home health [facility]) services: -- reports that [facility] for PT to work on patient's "imbalance and frequent falls" currently in place; states patient's biggest health issue is currently his frequency of falls -- reports home health "going well," confirms that she has phone number for [facility] agency   Safety/ Mobility/ Falls: -- caregiver states patient's biggest health issue is currently his frequency of falls; reports 2-plus falls over last month -- currently uses walker "all the time" -- reports patient is able to take care of "most" of his ADL's but requires assistance with meal preparation and ambulation, as he is limited by his blindness -- general fall risks/ prevention education discussed with caregiver today  Social/ Community Resource needs: -- caregiver reports that prior to her mother being hospitalized, patient and his spouse lived at home by themselves, with family assisting in care needs as indicated -- reports that since patient's spouse has been hospitalized, she has been staying "24-7" at hospital with her mother at hospital while other family members have assisted with patient's care needs at home; reports this will be changing "as of this Sunday October 17, 2016," and states that her sisters will not be able to assist with patient's or his spouse's care needs due to travel and job requirements -- caregiver voices uncertainty as to overall plan of assistance at home for patient and his spouse once [name]'s sister are unavailable next week; states that she will probably be staying at her parent's home, at least for a limited time.  Voices need for additional resources for in-home care assistance; discussed my placing a [facility] CSW referral to explore options for  ongoing in-home care assistance/ caregiver assistance/ support, and [name] is appreciative/ agreeable to this. -- currently denies transportation needs; reports not sure "may  be needed in the future" around her sister's upcoming limitations to assisting in patient's care  Advanced Directives: -- Confirms patient has HCPOA/ Living Will -- reports that she, [name] is patient's HCPOA -- caregiver uncertain about details of patient's Living will  Caregiver [name] denies further issues, concerns, or problems today, and again cites time constraints in her availability to talk today.  I provided her with my direct phone number, which had dropped on her call phone, and she stated she was unable to take the main [place] office phone number, and the [facility] CM 24-hour nurse advice phone numbers as she is in route to her home from the hospital.  Discussed my general schedule with her in the coming week, and encouraged her to call my phone number to obtain main [place] office phone number, and the [facility] CM 24-hour nurse advice phone numbers from my voicemail should needs arise prior to next scheduled [place] outreach, which we scheduled today with initial home visit.  Plan:  Patient will take medications as prescribed and will attend all scheduled provider appointments  Patient will continue actively participating in home health PT sessions  Patient will continue using assistive devices for fall prevention  I will place [facility] CSW referral to explore options for ongoing in-home care needs/ assistance  I will make patient's PCP aware of [facility] CM involvement in patient's care  [facility] CM involvement in patient's care to continue with scheduled initial home visit on October 26, 2016  [name], RN, BSN, [employer] [facility] Care Management  [phone]

## 2016-10-18 ENCOUNTER — Other Ambulatory Visit: Payer: Self-pay | Admitting: Pharmacist

## 2016-10-18 ENCOUNTER — Other Ambulatory Visit: Payer: Self-pay | Admitting: Licensed Clinical Social Worker

## 2016-10-18 DIAGNOSIS — N2581 Secondary hyperparathyroidism of renal origin: Secondary | ICD-10-CM | POA: Diagnosis not present

## 2016-10-18 DIAGNOSIS — D631 Anemia in chronic kidney disease: Secondary | ICD-10-CM | POA: Diagnosis not present

## 2016-10-18 DIAGNOSIS — N186 End stage renal disease: Secondary | ICD-10-CM | POA: Diagnosis not present

## 2016-10-18 DIAGNOSIS — E1129 Type 2 diabetes mellitus with other diabetic kidney complication: Secondary | ICD-10-CM | POA: Diagnosis not present

## 2016-10-18 NOTE — Telephone Encounter (Signed)
Spoke with Mr. Kidd daughter.  He is taking midodrine 5-15mg  as needed for low BP after HD.  Please add this to his medication list.  Please get records from recent hospitalization at [facility] in [facility].  Is it possible to get a sooner clinic appointment?

## 2016-10-18 NOTE — Patient Outreach (Addendum)
[place] [facility]) Care Management  10/18/2016  Alexander [name] January 30, 1941 [id number]  Patient was referred to [place] by [place] for polypharmacy medication review.   Placed call to patient's daughter, whom per review of [facility] notes is contact for patient---daughter initially requested a call back in about 30 minutes.   Placed a return call to patient's daughter per her request, HIPAA details of patient verified.  Explained purpose of call was to review patient's current medications.    She reports her sister and patient had gone over medications recently with pharmacist in PCP clinic and she was in the car without the medications at time of call.   Daughter reports main concern is patient's midodrine---reports it is not on any of his lists and he sometimes takes on dialysis days.  Daughter reports she is following up with Dr Oval Linsey regarding this---discussed per review of this chart, appears it may have been discontinued from medication list following 09/02/16 hospital discharge.  She was encouraged to follow-up with Dr Oval Linsey as she is doing regarding midodrine.   Daughter also expresses difficulty getting records to Dr Oval Lin[place]   She was counseled she could fill out another record release form at patient's next INR check and have cardiology refax records release to healthcare facility in question.   Daughter reports at this time her mom and her are filling medication box for patient.   Plan:  Offered home visit with [facility] RN Richarda Osmond next week to review patient's medications---this was accepted.    [name], PharmD, [place] [phone]

## 2016-10-18 NOTE — Telephone Encounter (Signed)
Please call,concerning his Midodrine.It is not on medication list and wants to know if he suppose to be taking it? Also wants to know if you received his Echo results from his hospital stay in [facility]?

## 2016-10-18 NOTE — Patient Outreach (Signed)
[facility]) Care Management  10/18/2016  Alexander [name] November 04, 1940 [id number]  Assessment- CSW completed initial outreach call to daughter Alexander [name] after receiving referral for caregiver resource assistance and information. Alexander [name] answered and provided HIPPA verifications. CSW introduced self, reason for call and of [place] [facility] social work services. Patient reports that she is needing general information in regards of finding a caregiver for her parents. Alexander [name] is a caregiver to both patient and patient's spouse (her mother). Alexander [name] states that other family members assist her with caregiving but she is in need of additional help and was not sure how to go about gaining an aide. CSW provided emotional support over phone. CSW also provided information on [employer] program which is a free program through DSS for those who do not have Medicaid. Alexander [name] is agreeable to CSW placing patient on the wait list for services but understands that the current wait list is up to 1 year and 6 months. CSW informed Alexander [name] that is would be ideal if she could also place her mother on the wait list. Alexander [name] is agreeable to do this. CSW educated Alexander [name] on how to make a referral to [employer] for her mother. CSW informed her that DSS will contact family every 6 or so months by mail to confirm that they still want to be on the wait list for program and that they will need to reply or they will take them off the wait list. Alexander [name] shares that she would like for CSW to ask if they could make a phone call to her instead of mailing family because "if it is mailed to them, it will get lost." CSW is agreeable to include this request in the referral. CSW will email Alexander [name] information on [employer] which includes contact information for her to make referral.   CSW informed Alexander [name] that she can also email her a list of private pay agencies and provided information on their typical cost. CSW stated  that if family is looking for a more affordable option then sometimes families will network within the community ([place], church, Social research officer, government) to find an aide that still has CNA experience but that is not attached to an agency so they would be more affordable. CSW provided additional advice and information in regards to this. CSW also provided education on day programs such as PACE, ACE, Psychiatric nurse. Alexander [name] is agreeable to [place] emailing her this information as well. Alexander [name] is agreeable to keeping CSW's number in case she has any questions in the future. CSW will not open case at this time as all social work needs have been met.   CSW completed call to In [place] in order to place referral. CSW unable to reach program but left a message with caseworker requesting a return call.   Plan-CSW will not open case at this time but will continue to make calls to In [facility] in order to place a referral. CSW will update [facility] RNCM.  [name], BSW, MSW, [email or web address] Phone: [phone] Fax: [phone]

## 2016-10-18 NOTE — Telephone Encounter (Signed)
Returned call to daughter (ok per DPR)-advised per Dr. Quintella Reichert last [place] note:  # Hypotension/syncope: Alexander [name] is doing much better and denies any recent episodes of pre-syncope.  Continue midodrine to 15 mg tid.  This would likely improve with treatment of his severe aortic stenosis.   Currently not on med list or list at [place] with Dr. Angelena Form.  Seems that there was some confusion with midodrine and metoprolol, therefore midodrine was taken off med list at [place] with [place].   Advised I would route to [name] to verify.  Also aware would verify about records with primary nurse tomorrow who is [facility] today.  Verbalized understanding.

## 2016-10-19 ENCOUNTER — Other Ambulatory Visit: Payer: Self-pay | Admitting: Licensed Clinical Social Worker

## 2016-10-19 DIAGNOSIS — H3530 Unspecified macular degeneration: Secondary | ICD-10-CM | POA: Diagnosis not present

## 2016-10-19 DIAGNOSIS — F329 Major depressive disorder, single episode, unspecified: Secondary | ICD-10-CM | POA: Diagnosis not present

## 2016-10-19 DIAGNOSIS — D631 Anemia in chronic kidney disease: Secondary | ICD-10-CM | POA: Diagnosis not present

## 2016-10-19 DIAGNOSIS — N186 End stage renal disease: Secondary | ICD-10-CM | POA: Diagnosis not present

## 2016-10-19 DIAGNOSIS — I509 Heart failure, unspecified: Secondary | ICD-10-CM | POA: Diagnosis not present

## 2016-10-19 DIAGNOSIS — I4891 Unspecified atrial fibrillation: Secondary | ICD-10-CM | POA: Diagnosis not present

## 2016-10-19 MED ORDER — MIDODRINE HCL 5 MG PO TABS: ORAL_TABLET | ORAL | 3 refills | Status: DC

## 2016-10-19 NOTE — Patient Outreach (Signed)
[facility] [facility]) Care Management  10/19/2016  Alexander [name] 09-18-1940 [id number]  Assessment- CSW received incoming call from [facility] [place] program through [place]. CSW was able to complete referral to program for patient. CSW was informed that wait list is "not as long as it use to be."   Plan-CSW will close case at this time and update [facility] RNCM.  Alexander [name], BSW, MSW, [name] Phone: [phone] Fax: [phone]

## 2016-10-19 NOTE — Telephone Encounter (Signed)
Med list updated

## 2016-10-20 DIAGNOSIS — N2581 Secondary hyperparathyroidism of renal origin: Secondary | ICD-10-CM | POA: Diagnosis not present

## 2016-10-20 DIAGNOSIS — D631 Anemia in chronic kidney disease: Secondary | ICD-10-CM | POA: Diagnosis not present

## 2016-10-20 DIAGNOSIS — E1129 Type 2 diabetes mellitus with other diabetic kidney complication: Secondary | ICD-10-CM | POA: Diagnosis not present

## 2016-10-20 DIAGNOSIS — I482 Chronic atrial fibrillation: Secondary | ICD-10-CM | POA: Diagnosis not present

## 2016-10-20 DIAGNOSIS — N186 End stage renal disease: Secondary | ICD-10-CM | POA: Diagnosis not present

## 2016-10-20 LAB — PROTIME-INR: INR: 1.2 — AB (ref 0.9–1.1)

## 2016-10-21 DIAGNOSIS — F329 Major depressive disorder, single episode, unspecified: Secondary | ICD-10-CM | POA: Diagnosis not present

## 2016-10-21 DIAGNOSIS — H3530 Unspecified macular degeneration: Secondary | ICD-10-CM | POA: Diagnosis not present

## 2016-10-21 DIAGNOSIS — I509 Heart failure, unspecified: Secondary | ICD-10-CM | POA: Diagnosis not present

## 2016-10-21 DIAGNOSIS — N186 End stage renal disease: Secondary | ICD-10-CM | POA: Diagnosis not present

## 2016-10-21 DIAGNOSIS — I4891 Unspecified atrial fibrillation: Secondary | ICD-10-CM | POA: Diagnosis not present

## 2016-10-21 DIAGNOSIS — D631 Anemia in chronic kidney disease: Secondary | ICD-10-CM | POA: Diagnosis not present

## 2016-10-22 DIAGNOSIS — N186 End stage renal disease: Secondary | ICD-10-CM | POA: Diagnosis not present

## 2016-10-22 DIAGNOSIS — D631 Anemia in chronic kidney disease: Secondary | ICD-10-CM | POA: Diagnosis not present

## 2016-10-22 DIAGNOSIS — E1129 Type 2 diabetes mellitus with other diabetic kidney complication: Secondary | ICD-10-CM | POA: Diagnosis not present

## 2016-10-22 DIAGNOSIS — N2581 Secondary hyperparathyroidism of renal origin: Secondary | ICD-10-CM | POA: Diagnosis not present

## 2016-10-25 ENCOUNTER — Ambulatory Visit (INDEPENDENT_AMBULATORY_CARE_PROVIDER_SITE_OTHER): Payer: Medicare Other | Admitting: Pharmacist Clinician (PhC)/ Clinical Pharmacy Specialist

## 2016-10-25 ENCOUNTER — Ambulatory Visit: Payer: Medicare Other | Admitting: *Deleted

## 2016-10-25 ENCOUNTER — Telehealth: Payer: Self-pay | Admitting: Cardiovascular Disease

## 2016-10-25 DIAGNOSIS — Z7901 Long term (current) use of anticoagulants: Secondary | ICD-10-CM

## 2016-10-25 DIAGNOSIS — N186 End stage renal disease: Secondary | ICD-10-CM | POA: Diagnosis not present

## 2016-10-25 DIAGNOSIS — N2581 Secondary hyperparathyroidism of renal origin: Secondary | ICD-10-CM | POA: Diagnosis not present

## 2016-10-25 DIAGNOSIS — E1129 Type 2 diabetes mellitus with other diabetic kidney complication: Secondary | ICD-10-CM | POA: Diagnosis not present

## 2016-10-25 DIAGNOSIS — I480 Paroxysmal atrial fibrillation: Secondary | ICD-10-CM

## 2016-10-25 DIAGNOSIS — D631 Anemia in chronic kidney disease: Secondary | ICD-10-CM | POA: Diagnosis not present

## 2016-10-25 LAB — POCT INR: INR: 1.1

## 2016-10-25 NOTE — Telephone Encounter (Signed)
Faxed Release signed by patient to [place]- to obtain records per Dr Oval Linsey.  Faxed on 10/25/16. lp

## 2016-10-26 ENCOUNTER — Encounter: Payer: Self-pay | Admitting: *Deleted

## 2016-10-26 ENCOUNTER — Telehealth: Payer: Self-pay | Admitting: Cardiovascular Disease

## 2016-10-26 ENCOUNTER — Other Ambulatory Visit: Payer: Self-pay | Admitting: Pharmacist

## 2016-10-26 ENCOUNTER — Other Ambulatory Visit: Payer: Self-pay | Admitting: *Deleted

## 2016-10-26 DIAGNOSIS — Z992 Dependence on renal dialysis: Secondary | ICD-10-CM | POA: Diagnosis not present

## 2016-10-26 DIAGNOSIS — F329 Major depressive disorder, single episode, unspecified: Secondary | ICD-10-CM | POA: Diagnosis not present

## 2016-10-26 DIAGNOSIS — D631 Anemia in chronic kidney disease: Secondary | ICD-10-CM | POA: Diagnosis not present

## 2016-10-26 DIAGNOSIS — N186 End stage renal disease: Secondary | ICD-10-CM | POA: Diagnosis not present

## 2016-10-26 DIAGNOSIS — I509 Heart failure, unspecified: Secondary | ICD-10-CM | POA: Diagnosis not present

## 2016-10-26 DIAGNOSIS — I4891 Unspecified atrial fibrillation: Secondary | ICD-10-CM | POA: Diagnosis not present

## 2016-10-26 DIAGNOSIS — E1129 Type 2 diabetes mellitus with other diabetic kidney complication: Secondary | ICD-10-CM | POA: Diagnosis not present

## 2016-10-26 DIAGNOSIS — H3530 Unspecified macular degeneration: Secondary | ICD-10-CM | POA: Diagnosis not present

## 2016-10-26 NOTE — Telephone Encounter (Signed)
Received records from [facility] as requested by Dr Oval Linsey.  Patient has upcoming appointment with Dr Oval Linsey on 11/11/16.  Records put with Dr Blenda Mounts schedule for 11/11/16. lp

## 2016-10-26 NOTE — Patient Outreach (Signed)
[facility]) Care Management  [place] initial Home Visit  10/26/2016  RANDEN KAUTH [birth year] [id number]  BONNY EGGER is a 76 y.o. male referred to [place] from [facility] telephonic RN CM MD referral. Patient has history including, but not limited to, PAD, Aortic stenosis with TAVR 08/31/16, ESRD on HD (M, W, F), A-Fib on coumadin, DM, CVA in 2004, CHF, macular degeneration with legal blindness, and multiple falls.   HIPAA/ identity verified with patient in person today, and patient's wife and daughter [name] are present in home today, and actively participate in visit.  [facility] Pharmacist [name] is also present in home for joint [facility] CM/ pharmacy visit.  [facility] CM services discussed with patient and family today, and written consent for [facility] CM services was obtained.  Subjective: "I am doing okay.... I just have to learn to slow down, and for someone who has always been on the go, that isn't easy."  Assessment:  Mr. Churchwell has had multiple recent falls due to dizziness/ syncope, and is a high fall risk.  Mr. Taboada is legally blind and requires some assistance with his care needs, although he has maintained a high level of independence and self-sufficiency despite this challenge.  Mr. Balfour is compliant with his overall plan of care, takes his medications as prescribed with assistance from his family, uses assistive devices with ambulation, and attends provider appointments and HD sessions as scheduled. Mr. Charline Bills has a supportive and involved family that assist with his care needs as indicated.  Mr. Leyda was recently diagnosed with CHF, and can benefit from ongoing education around self- health management of chronic disease states of CHF and CKD.   Today patient reports that he is "doing better now that" his wife is back home from her recent hospitalization.  Patient reports "regular" chronic abdominal pain which is diffuse over his "entire upper" abdomen for 15  years, stating that he "has learned to live with it," adding that "it sometimes gets worse or better, but it is always there."  Patient rates this abdominal pain as "5/10" today and reports that he is not concerned with this pain, as he "has told every doctor" he has about it and had the pain evaluated stating, "they can't find anything wrong that could be causing the pain."    Medications: -- has all medicationsand takes as prescribed with assistance from family who manage patient's medications, as he is legally blind;wife uses pill planner box -- medication reconciliation performed today by [facility] Pharmacist Lennette Bihari, and all questions/ concerns around medications were addressed  Home health [facility]) services: -- reports HH for PT/ OT through [place] to work on patient's "imbalance and frequent falls" currently in place after recent hospitalization while vacationing at [employer]; states "POT visited today and it was the last visit; patient reports he believes [facility] PT sessions "helped a lot," stating that he believes he is walking "better." -- HH OT to make visit later this afternoon; patient/ family unsure how long [facility] OT will be in place  Safety/ Mobility/ Falls: -- reports 2-3 falls falls over last month, stating, "I get in too big of a hurry and sometimes when I stand up, I get dizzy and fall out." -- currently uses rolling walker "all the time" -- reports patient is able to take care of "most" of his ADL's but requires assistance with meal preparation and ambulation, as he is limited by his blindness -- no obvious fall hazards noted  in patient's home environment today; however, he has 2 family dogs and throw rugs in home -- general fall risks/ prevention education discussed with patient and caregivers today; patient was seated during home visit after [facility] PT session earlier today and did not ambulate during home visit  Social/ Community Resource needs: -- prior to patient's wife being hospitalized,  patient and his spouse lived at home by themselves independently, with family assisting in care needs as indicated -- reports good support from family members who are available/ actively involved in patient/ spouse care as needed -- daughter [name] (on [facility] CM written consent) is currently primary caregiver for both patient and his wife, as her sisters are currently unavailable due to travel and job requirements, and has been staying with patient/ wife during day time hours to assist with care needs; reports that patient and wife are comfortable staying at home by themselves together now that patient's spouse is home form the hospital -- patient was staying at home by himself "on and off" around family member's availability when his spouse was recently hospitalized  -- Daughter confirms that she spoke with [facility] CSW by phone regarding options for assistance at home for patient and his spouse, and is waiting for follow up e-mail from [place]; discussed that I would follow up with [facility] CSW to make sure she has correct e-mail address for patient's daughter -- currently continues to deny transportation needs; reports patient takes [employer] bus to HD sessions on M-W-Fr, and that daughter [name] or patient's spouse picks patient up from HD sessions.  Self-Health Management of chronic disease state of CHF/ CKD: -- HD sessions on M-W-Fr on "[employer]"; reports [facility] bus picks him up at 5:00 am for sessions; follows renal diet -- reports recently diagnosed with CHF "for the first time" during family vacation in June -- does not monitor/ record daily weights currently, as patient is weighed regularly at HD sessions and patient occasionally monitors weight "in between" using audible scales -- understands value of limiting salt intake; reports that he eats salt "only on Monday, Wednesday, and Friday" prior to HD sessions; states that he "likes salt." -- CHF zones along with "Living Well with CHF" packet provided to  patient and reviewed with him and with his family, with focus on yellow zone.  General self-health management education/ strategies for CHF discussed and thoroughly reviewed with patient and his family today.  Patient and his family deny further issues, concerns, or problems today.  I provided patient/ family withmy direct phone number, the main [place] office phone number, and the [facility] CM 24-hour nurse advice line phone number should needs arise prior to next scheduled [place] outreach by phone next week.  Objective:    BP 128/78   Pulse 90   Resp 18   Wt 200 lb 9.9 oz (91 kg)   SpO2 98%   BMI 28.38 kg/m    Review of Systems  Constitutional: Negative.  Negative for malaise/fatigue and weight loss.  Eyes:       Patient legally blind since age of 73 due to macular degeneration  Respiratory: Negative for cough and shortness of breath.        Reports SOB with ambulation; patient did not ambulate during today's home visit, no SOB noted during visit  Cardiovascular: Negative for chest pain and leg swelling.  Gastrointestinal: Positive for abdominal pain. Negative for nausea.       Reports chronic diffuse upper abdominal pain x "15 years;"  states pain never goes away, but waxes/ wanes.  Reports "no doctor has ever found anything wrong."  Genitourinary:       On HD Mon-Wed- Fri  Musculoskeletal: Positive for falls.       3 recent falls in one last 5 weeks   Neurological: Positive for dizziness. Negative for weakness.       Reports occasional dizziness when standing from seated position; denies dizziness today during visit  Endo/Heme/Allergies: Bruises/bleeds easily.       On anticoagulation therapy  Psychiatric/Behavioral: Negative.  Negative for depression. The patient is not nervous/anxious.     Physical Exam  Constitutional: He is oriented to person, place, and time. He appears well-developed and well-nourished. No distress.  Cardiovascular: Normal rate, regular rhythm,  normal heart sounds and intact distal pulses.   Pulses:      Radial pulses are 2+ on the right side, and 2+ on the left side.  Respiratory: Effort normal and breath sounds normal. No respiratory distress. He has no wheezes. He has no rales.  GI: Soft. Bowel sounds are normal.  Genitourinary:  Genitourinary Comments: HD cath present with + thrill/ (L) wrist  Musculoskeletal: He exhibits no edema.  Neurological: He is alert and oriented to person, place, and time.  Skin: Skin is warm and dry. No erythema.  Psychiatric: He has a normal mood and affect. His behavior is normal. Judgment and thought content normal.   Fall/Depression Screening:    Fall Risk  10/26/2016 10/15/2016 10/04/2016  Falls in the past year? - Yes Yes  Comment - - -  Number falls in past yr: - 2 or more 2 or more  Comment - - -  Injury with Fall? - Yes Yes  Risk Factor Category  - High Fall Risk High Fall Risk  Risk for fall due to : History of fall(s);Impaired mobility;Impaired balance/gait;Impaired vision History of fall(s);Impaired balance/gait;Impaired mobility;Impaired vision;Medication side effect History of fall(s);Impaired balance/gait;Impaired mobility;Medication side effect;Mental status change;Impaired vision  Follow up Falls prevention discussed;Education provided;Falls evaluation completed Falls prevention discussed Falls evaluation completed   Plan:  Patient will take medications as prescribed and will attend all scheduled provider appointments  Patient will continue actively participating in home health OT sessions  Patient will continue using assistive devices for fall prevention, and will continue practicing moving slowly when standing from seated position  I will confirm patient's daughter's e-mail address with [facility] CSW   I will share notes from today's [facility] initial home visit with patient's PCP   Patient/ caregiver's/ family will review educational material provided today re: self-health  management of chronic disease state of CHF  [facility] Community CM involvement in patient's care to continue with scheduled phone call next week  [facility] CM Care Plan Problem One     Most Recent Value  Care Plan Problem One  High fall risk related to multiple recent falls and previous CVA in patient who is legally blind and requires assistance with care  Role Documenting the Problem One  Care Management Coordinator  Care Plan for Problem One  Active  THN Long Term Goal   Over the next 60 days, patient/ caregiver will be able to verbalize long-term plan to reduce patient fall episodes, as evidenced by patient and caregiver reporting during [facility] RN CM outreach  [place] Long Term Goal Start Date  10/15/16  Interventions for Problem One Long Term Goal  Using teachback method, discussed patient's current clinical status with patient/ caregivers, previous fall epsiodes, and fall prevention education,  initial THN RN CM home visit completed  TH[facility] CM Short Term Goal #1   Over the next 30 days, patient will continue active participation with [facility] PT and will continue using assistive devices for ambulation, as evidenced by patient/ caregiver reporting during [facility] RN CM outreach  [facility] CM Short Term Goal #1 Start Date  10/15/16  Interventions for Short Term Goal #1  Using teachback method, discussed patient's current functional status with patient and caregivers/ family, and clarified current [place] services in place  [facility] CM Short Term Goal #2   Over the next 20 days, patient/ caregiver will meet with [facility] RN CM for fall risk evaluation and education, as evidenced by successful completion of THN RN CM in home visit  [facility] CM Short Term Goal #2 Start Date  10/15/16  [facility] CM Short Term Goal #2 Met Date  10/26/16  Interventions for Short Term Goal #2  Using teachback method, discussed with patient's caregiver THN RN CM role, THN CM services, basic fall risks/ prevention education,  scheduled THN RN CM initial home visit for later this  month    [facility] CM Care Plan Problem Two     Most Recent Value  Care Plan Problem Two  Need for in-home care assistance in patient with legal blindness and multiple chronic disease states  Role Documenting the Problem Two  Care Management Coordinator  Care Plan for Problem Two  Active  Interventions for Problem Two Long Term Goal   Using teachback method, discussed current assistance needed, confirmed that caregiver had communicated with [facility] CSW regarding options for in-home care assistance  THN Long Term Goal  Over the next 60 days, patient/ caregiver- daughter will be able to verbalize options/ plan for in- home care assistance, as evidenced by patient/ caregiver reporting during [facility] RN CM outreach  [facility] Long Term Goal Start Date  10/15/16  [facility] CM Short Term Goal #1   Over the next 30 days, patient/ caregiver will communicate needs for additional in-home care assistance to [place], as evidenced by patient's caregiver reporting and THN Cm collaboration during [facility] RN CM outreach  [facility] CM Short Term Goal #1 Start Date  10/15/16  Interventions for Short Term Goal #2   Using teachback method, discussed CSW outreach with caregiver and placed follow up request to CSW to re-send referral information by e-mail to patient's daughter     I appreciate the opportunity to participate in Mr. Saefong care,  [name], RN, BSN, [employer] Coordinator [facility] Care Management  [phone]

## 2016-10-26 NOTE — Patient Outreach (Signed)
[place] [facility]) Care Management  [place]   10/26/2016  Alexander [name] June 18, 1940 [id number]  Subjective:  Home visit with patient, spouse, his daughter Alexander [name], on [facility] Consent form---completed home visit with [facility] RN Richarda Osmond.    Patient was referred to [facility] Pharmacist for medication review.    Patient has a past medical history significant for:  End stage renal disease on dialysis Monday, Wednesday,Friday, hyperlipidemia, aortic stenosis status post TAVR, atrial fibrillation, on oral anti-coagulation with warfarin.   His daughter reports her or patient's spouse fill patient's pill box.  They had prescription bottles available for review and report pill box was refilled for 2 weeks recently.  Offered to review pill box and they decline needing [facility] Pharmacist to review it.    Patient's spouse and daughter deny issues/concerns with filling pill box.    Objective:   Encounter Medications: Outpatient Encounter Prescriptions as of 10/26/2016  Medication Sig Note  . acetaminophen (TYLENOL) 325 MG tablet Take 2 tablets (650 mg total) by mouth every 6 (six) hours as needed for mild pain.   [name] aspirin EC 81 MG EC tablet Take 1 tablet (81 mg total) by mouth daily.   [name] atorvastatin (LIPITOR) 40 MG tablet Take 20 mg by mouth daily.   [name] b complex-vitamin c-folic acid (NEPHRO-VITE) 0.8 MG TABS Take 1 tablet by mouth daily.    . bromocriptine (PARLODEL) 5 MG capsule Take 5 mg by mouth at bedtime.    . calcium acetate (PHOSLO) 667 MG capsule Take 667 mg by mouth 4 (four) times daily.    . cetirizine (ZYRTEC) 10 MG tablet Take 10 mg by mouth at bedtime.    [name] FLUoxetine (PROZAC) 20 MG capsule Take 40 mg by mouth daily.    . midodrine (PROAMATINE) 5 MG tablet Take 15mg  (3 tablets) three times weekly as needed for low blood pressure after dialysis.   [name] omeprazole (PRILOSEC) 20 MG capsule Take 20 mg by mouth daily.   . predniSONE (DELTASONE) 5 MG tablet Take 5 mg by mouth daily.    .  dorzolamide-timolol (COSOPT) 22.3-6.8 MG/ML ophthalmic solution Place 1 drop into the right eye 2 (two) times daily.   . metoprolol tartrate (LOPRESSOR) 50 MG tablet Take 25 mg by mouth 2 (two) times daily.    . mupirocin ointment (BACTROBAN) 2 % Place 1 application into the nose 2 (two) times daily. Last dose is on 09/04/2016   . SENSIPAR 30 MG tablet Take 1 tablet (30 mg total) by mouth every other day.   . warfarin (COUMADIN) 5 MG tablet Take 1-1.5 tablets by mouth daily as directed by coumadin clinic (Patient taking differently: Take 5-7.5 mg by mouth See admin instructions. Take 1-1.5 tablets by mouth daily as directed by coumadin clinic  Current dose is 5 mg (1 tablet) daily.) 08/27/2016: Last dose was on 08/25/16 PM   No facility-administered encounter medications on file as of 10/26/2016.     Functional Status: In your present state of health, do you have any difficulty performing the following activities: 10/15/2016 08/31/2016  Hearing? Y N  Comment per daughter/ primary caregiver, Alexander [name] who has provided verbal consent to [place]? Y Y  Comment "legally blind" per daughter/ primary caregiver, Alexander [name] who has provided verbal consent to [facility] CM services -  Difficulty concentrating or making decisions? Y N  Comment "Occasionally forgets things" per daughter/ primary caregiver, Alexander [name] who has provided verbal consent to [facility] CM services -  Walking  or climbing stairs? Tempie Donning  Comment per daughter/ primary caregiver, Alexander [name] who has provided verbal consent to [place] services -  Dressing or bathing? N Y  Comment per daughter/ primary caregiver, Alexander [name] who has provided verbal consent to [place] errands, shopping? Tempie Donning  Comment per daughter/ primary caregiver, Alexander [name] who has provided verbal consent to [place] and eating ? Y -  Comment "Trouble preparing food; no trouble eating" per daughter/ primary caregiver, Alexander [name] who has provided verbal consent to  [place] services -  Using the Toilet? N -  Comment per daughter/ primary caregiver, Alexander [name] who has provided verbal consent to [facility] CM services -  In the past six months, have you accidently leaked urine? N -  Comment On HD M/ W/ F, per daughter/ primary caregiver, Alexander [name] who has provided verbal consent to [facility] CM services -  Do you have problems with loss of bowel control? N -  Comment per daughter/ primary caregiver, Alexander [name] who has provided verbal consent to [facility] CM services -  Managing your Medications? Y -  Comment Family assists, per daughter/ primary caregiver, Alexander [name] who has provided verbal consent to [facility] CM services -  Managing your Finances? Y -  Comment Family assists, per daughter/ primary caregiver, Alexander [name] who has provided verbal consent to [place] or managing your Housekeeping? Y -  Comment Family assist, per daughter/ primary caregiver, Alexander [name] who has provided verbal consent to [place] services -  Some recent data might be hidden    Fall/Depression Screening: Fall Risk  10/26/2016 10/15/2016 10/04/2016  Falls in the past year? - Yes Yes  Comment - - -  Number falls in past yr: - 2 or more 2 or more  Comment - - -  Injury with Fall? - Yes Yes  Risk Factor Category  - High Fall Risk High Fall Risk  Risk for fall due to : History of fall(s);Impaired mobility;Impaired balance/gait;Impaired vision History of fall(s);Impaired balance/gait;Impaired mobility;Impaired vision;Medication side effect History of fall(s);Impaired balance/gait;Impaired mobility;Medication side effect;Mental status change;Impaired vision  Follow up Falls prevention discussed;Education provided;Falls evaluation completed Falls prevention discussed Falls evaluation completed   PHQ 2/9 Scores 10/26/2016 10/04/2016  PHQ - 2 Score 0 -  Exception Documentation - Other- indicate reason in comment box  Not completed - call completed with dtr      Assessment:  Medication review per patient's daughter  report and prescription bottles in patient's possession:  Drugs sorted by system:  Neurologic/Psychologic: -fluoxetine   Cardiovascular: -aspirin 81 mg -atorvastatin  -metoprolol tartrate  -warfarin   Pulmonary/Allergy: -cetirizine   Gastrointestinal: -omeprazole   Renal: -renal vitamin  -calcium acetate ([place]os[place])  -cinacalet   Topical: -mupirocin ointment   Pain: -acetaminophen as needed   Miscellaneous: -bromocriptine  -dorzolamide/timolol eye drops  -prednisone   Other issues noted:  -Patient has 2 weeks of metoprolol tartrate left and needs assistance getting dose clarified and refills ordered by cardiology.  Patient's daughter reports patient presently takes metoprolol tartrate 50 mg tabs---1/2 tab twice daily.    Encouraged patient to continue taking medications as prescribed.    Plan:  Will route note to PCP.   Will send message to Dr Berneice Gandy, cardiologist, regarding patient's metoprolol tartrate.    Will follow-up with patient's daughter/spouse in next 2 weeks to see if they have any other pharmacy related needs  [name], PharmD, [place] [phone] .

## 2016-10-27 ENCOUNTER — Encounter: Payer: Self-pay | Admitting: *Deleted

## 2016-10-27 DIAGNOSIS — N186 End stage renal disease: Secondary | ICD-10-CM | POA: Diagnosis not present

## 2016-10-27 DIAGNOSIS — E1129 Type 2 diabetes mellitus with other diabetic kidney complication: Secondary | ICD-10-CM | POA: Diagnosis not present

## 2016-10-27 DIAGNOSIS — D631 Anemia in chronic kidney disease: Secondary | ICD-10-CM | POA: Diagnosis not present

## 2016-10-27 DIAGNOSIS — N2581 Secondary hyperparathyroidism of renal origin: Secondary | ICD-10-CM | POA: Diagnosis not present

## 2016-10-28 DIAGNOSIS — F329 Major depressive disorder, single episode, unspecified: Secondary | ICD-10-CM | POA: Diagnosis not present

## 2016-10-28 DIAGNOSIS — I4891 Unspecified atrial fibrillation: Secondary | ICD-10-CM | POA: Diagnosis not present

## 2016-10-28 DIAGNOSIS — N186 End stage renal disease: Secondary | ICD-10-CM | POA: Diagnosis not present

## 2016-10-28 DIAGNOSIS — D631 Anemia in chronic kidney disease: Secondary | ICD-10-CM | POA: Diagnosis not present

## 2016-10-28 DIAGNOSIS — I509 Heart failure, unspecified: Secondary | ICD-10-CM | POA: Diagnosis not present

## 2016-10-28 DIAGNOSIS — H3530 Unspecified macular degeneration: Secondary | ICD-10-CM | POA: Diagnosis not present

## 2016-10-29 ENCOUNTER — Encounter: Payer: Self-pay | Admitting: *Deleted

## 2016-10-29 ENCOUNTER — Telehealth: Payer: Self-pay | Admitting: *Deleted

## 2016-10-29 ENCOUNTER — Other Ambulatory Visit: Payer: Self-pay | Admitting: *Deleted

## 2016-10-29 DIAGNOSIS — E1129 Type 2 diabetes mellitus with other diabetic kidney complication: Secondary | ICD-10-CM | POA: Diagnosis not present

## 2016-10-29 DIAGNOSIS — N2581 Secondary hyperparathyroidism of renal origin: Secondary | ICD-10-CM | POA: Diagnosis not present

## 2016-10-29 DIAGNOSIS — D631 Anemia in chronic kidney disease: Secondary | ICD-10-CM | POA: Diagnosis not present

## 2016-10-29 DIAGNOSIS — N186 End stage renal disease: Secondary | ICD-10-CM | POA: Diagnosis not present

## 2016-10-29 MED ORDER — METOPROLOL TARTRATE 25 MG PO TABS: 25.0000 mg | ORAL_TABLET | Freq: Two times a day (BID) | ORAL | 3 refills | Status: DC

## 2016-10-29 NOTE — Patient Outreach (Signed)
[place] [facility]) Care Management [place] Telephone Outreach  10/29/2016  [name] June 23, 1940 [id number]  Unsuccessful telephone outreach to [name], daughter/ caregiver, on [facility] CM written consent re: Alexander [name] is a 76 y.o. male referred to [place] from [facility] telephonic RN CM MD referral. Patient has history including, but not limited to, PAD, Aortic stenosis with TAVR 08/31/16, ESRD on HD (M, W, F), A-Fib on coumadin, DM, CVA in 2004, CHF, macular degeneration with legal blindness, and multiple falls.    Call was placed today to confirm that patient's caregiver/ daughter had received the e-mail that was re-sent after initial home visit on Tuesday 10/26/16 from [place] for patient in-home care options/ resources.  HIPAA compliant voice mail message left for patient's caregiver, sharing with her that the resource list from [place] had been re-forwarded to [employer] e-mail, as she requested earlier this week.  Let [name] know in voicemail message, that if she did not receive the e-mail that I would be happy to put the hard copies of same in the mail via USPS to her.  Plan:  [place] outreach to continue with follow up call scheduled for next week.  [name], RN, BSN, [employer] [facility] Care Management  [phone]

## 2016-10-29 NOTE — Telephone Encounter (Signed)
Rx sent to [facility] Wife aware of tablet changed to 25 mg 1 tablet twice a day

## 2016-10-29 NOTE — Telephone Encounter (Signed)
-----   Message from Skeet Latch, MD sent at 10/27/2016  6:33 AM EDT ----- Regarding: RE: med question Good Morning,  That is the correct dose and we will send in a refill.  Best, Tiffany C. Oval Linsey, MD, [facility]  ----- Message ----- From: Lin Givens, [facility] Sent: 10/26/2016   5:21 PM To: Skeet Latch, MD Subject: med question                                   Dr Oval Linsey:  [facility] Pharmacist completed home visit and medication review with patient/family today.     Patient is needing a refill of metoprolol tartrate---presently has 50 mg tabs taking 1/2 tab twice daily---it was last written by Dr Einar Crow at [facility] when patient was admitted in [facility].    Is this something your office can clarify if patient is to continue, and if he is to continue metoprolol tartrate 25 mg twice daily, can prescription be written for 25 mg tablets?    Thanks for your time.  [name], PharmD, [place] [phone]

## 2016-11-01 ENCOUNTER — Ambulatory Visit (INDEPENDENT_AMBULATORY_CARE_PROVIDER_SITE_OTHER): Payer: Medicare Other | Admitting: Pharmacist Clinician (PhC)/ Clinical Pharmacy Specialist

## 2016-11-01 DIAGNOSIS — D631 Anemia in chronic kidney disease: Secondary | ICD-10-CM | POA: Diagnosis not present

## 2016-11-01 DIAGNOSIS — Z7901 Long term (current) use of anticoagulants: Secondary | ICD-10-CM | POA: Diagnosis not present

## 2016-11-01 DIAGNOSIS — I480 Paroxysmal atrial fibrillation: Secondary | ICD-10-CM

## 2016-11-01 DIAGNOSIS — N2581 Secondary hyperparathyroidism of renal origin: Secondary | ICD-10-CM | POA: Diagnosis not present

## 2016-11-01 DIAGNOSIS — E1129 Type 2 diabetes mellitus with other diabetic kidney complication: Secondary | ICD-10-CM | POA: Diagnosis not present

## 2016-11-01 DIAGNOSIS — N186 End stage renal disease: Secondary | ICD-10-CM | POA: Diagnosis not present

## 2016-11-01 LAB — POCT INR: INR: 2.9

## 2016-11-02 ENCOUNTER — Ambulatory Visit (INDEPENDENT_AMBULATORY_CARE_PROVIDER_SITE_OTHER): Payer: Medicare Other | Admitting: *Deleted

## 2016-11-02 DIAGNOSIS — I4891 Unspecified atrial fibrillation: Secondary | ICD-10-CM | POA: Diagnosis not present

## 2016-11-02 DIAGNOSIS — N186 End stage renal disease: Secondary | ICD-10-CM | POA: Diagnosis not present

## 2016-11-02 DIAGNOSIS — D631 Anemia in chronic kidney disease: Secondary | ICD-10-CM | POA: Diagnosis not present

## 2016-11-02 DIAGNOSIS — H3530 Unspecified macular degeneration: Secondary | ICD-10-CM | POA: Diagnosis not present

## 2016-11-02 DIAGNOSIS — I495 Sick sinus syndrome: Secondary | ICD-10-CM

## 2016-11-02 DIAGNOSIS — F329 Major depressive disorder, single episode, unspecified: Secondary | ICD-10-CM | POA: Diagnosis not present

## 2016-11-02 DIAGNOSIS — I509 Heart failure, unspecified: Secondary | ICD-10-CM | POA: Diagnosis not present

## 2016-11-02 LAB — CUP PACEART REMOTE DEVICE CHECK
Battery Remaining Longevity: 115 mo
Battery Remaining Percentage: 95.5 %
Brady Statistic AP VS Percent: 0 %
Brady Statistic AS VP Percent: 77 %
Brady Statistic AS VS Percent: 23 %
Brady Statistic RV Percent Paced: 18 %
Date Time Interrogation Session: 20180807063820
Implantable Lead Implant Date: 20161213
Implantable Lead Location: 753859
Lead Channel Impedance Value: 350 Ohm
Lead Channel Pacing Threshold Amplitude: 0.75 V
Lead Channel Pacing Threshold Pulse Width: 0.5 ms
Lead Channel Sensing Intrinsic Amplitude: 1 mV
Lead Channel Sensing Intrinsic Amplitude: 9.9 mV
Lead Channel Setting Pacing Amplitude: 1.75 V
Lead Channel Setting Pacing Amplitude: 2.5 V
Lead Channel Setting Pacing Pulse Width: 0.5 ms
MDC IDC LEAD IMPLANT DT: 20161213
MDC IDC LEAD LOCATION: 753860
MDC IDC MSMT BATTERY VOLTAGE: 3.01 V
MDC IDC MSMT LEADCHNL RA PACING THRESHOLD AMPLITUDE: 0.75 V
MDC IDC MSMT LEADCHNL RA PACING THRESHOLD PULSEWIDTH: 0.5 ms
MDC IDC MSMT LEADCHNL RV IMPEDANCE VALUE: 440 Ohm
MDC IDC PG IMPLANT DT: 20161213
MDC IDC SET LEADCHNL RV SENSING SENSITIVITY: 2 mV
MDC IDC STAT BRADY AP VP PERCENT: 0 %
MDC IDC STAT BRADY RA PERCENT PACED: 1 %
Pulse Gen Model: 2240
Pulse Gen Serial Number: 7839215

## 2016-11-02 NOTE — Progress Notes (Signed)
Remote pacemaker transmission.   

## 2016-11-03 ENCOUNTER — Ambulatory Visit: Payer: Medicare Other | Admitting: *Deleted

## 2016-11-03 ENCOUNTER — Other Ambulatory Visit: Payer: Self-pay | Admitting: Pharmacist

## 2016-11-03 ENCOUNTER — Encounter: Payer: Self-pay | Admitting: Cardiology

## 2016-11-03 DIAGNOSIS — E1129 Type 2 diabetes mellitus with other diabetic kidney complication: Secondary | ICD-10-CM | POA: Diagnosis not present

## 2016-11-03 DIAGNOSIS — N2581 Secondary hyperparathyroidism of renal origin: Secondary | ICD-10-CM | POA: Diagnosis not present

## 2016-11-03 DIAGNOSIS — D631 Anemia in chronic kidney disease: Secondary | ICD-10-CM | POA: Diagnosis not present

## 2016-11-03 DIAGNOSIS — N186 End stage renal disease: Secondary | ICD-10-CM | POA: Diagnosis not present

## 2016-11-04 ENCOUNTER — Other Ambulatory Visit: Payer: Self-pay | Admitting: *Deleted

## 2016-11-04 ENCOUNTER — Encounter: Payer: Self-pay | Admitting: *Deleted

## 2016-11-04 DIAGNOSIS — D631 Anemia in chronic kidney disease: Secondary | ICD-10-CM | POA: Diagnosis not present

## 2016-11-04 DIAGNOSIS — F329 Major depressive disorder, single episode, unspecified: Secondary | ICD-10-CM | POA: Diagnosis not present

## 2016-11-04 DIAGNOSIS — I509 Heart failure, unspecified: Secondary | ICD-10-CM | POA: Diagnosis not present

## 2016-11-04 DIAGNOSIS — N186 End stage renal disease: Secondary | ICD-10-CM | POA: Diagnosis not present

## 2016-11-04 DIAGNOSIS — H3530 Unspecified macular degeneration: Secondary | ICD-10-CM | POA: Diagnosis not present

## 2016-11-04 DIAGNOSIS — I4891 Unspecified atrial fibrillation: Secondary | ICD-10-CM | POA: Diagnosis not present

## 2016-11-04 NOTE — Patient Outreach (Signed)
[place] [facility]) Care Management [place] Telephone Outreach  11/04/2016  Alexander [name] 1941/03/12 [id number]  Successful telephone outreach to Alexander [name], daughter/ caregiver, on [name] CM written consent, of Alexander [name], 76 y.o. male referred to [name] [name] CM MD referral. Patient has history including, but not limited to, PAD, Aortic stenosis with TAVR 08/31/16, ESRD on HD (M, W, F), A-Fib on coumadin, DM, CVA in 2004, CHF, macular degeneration with legal blindness, and multiple falls.  HIPAA/ identity verified through patient's caregiver today.  Today Alexander [name] reports that patient is "doing pretty good."  Alexander [name] reports that "nothing much has changed since [name] CM home visit 2 weeks ago."  Alexander [name] further reports:  -- patient continuing taking medications as prescribed; acknowledged that she has spoken with Alexander [name], Alexander [name] pharmacist in follow up to Alexander [name] initial home visit, and she denies questions/ concerns around patient's medications today. -- [place] services for PT continue; patient/ caregiver had previously reported that [facility] PT was completed, but today states that she believes "that was wrong," that [facility] OT had been completed, while HH PT continues.  Reports patient continues benefiting from/ participating in [name] PT sessions -- no new falls; patient continues using walker with ambulation -- confirms that she did receive e-mail from Alexander [name] regarding follow up/ options around assistance at home for patient and his spouse; reports that she has briefly reviewed information, but has not yet begun process to obtain such services -- continues to deny transportation needs -- reports patient has started monitoring and recording daily weights, as recommended during [facility] CM initial home visit.  Daily weights are managed by patient's spouse, as patient is blind and requires assistance around this; continues using audible scales.  States can not  remember patient's weights at this time, but reports that she regularly follows up with patient and his spouse/ her mother regarding weights.  Alexander [name] has a good understanding of self-health management strategies around CHF, as she has assisted her in-laws with same in the past.  Reports patient's weights "have been holding steady with only a 2-3 pound weight change that is usually resolved after weekly HD sessions on M-W-F. -- Alexander [name] continues encouraging patient to follow low-salt diet; is unsure how well patient is following low salt diet guidelines -- Reports caregiver fatigue/ stress around being the primary caregiver for both patient and his spouse/ her mother; was provided resource for [employer] for caregivers, and was encouraged to follow up with them as allowed around her scheduled, and she verbalizes understanding, appreciation, and agreement.  Alexander [name] denies further issues, concerns, or problems today.  I confirmed that she, patient/ family hasmy direct phone number, the main [name] CM office phone number, and the [facility] CM 24-hour nurse advice line phone number should needs arise prior to next scheduled [place] outreach by phone in 2 weeks.  Plan:  Patient will take medications as prescribed and will attend all scheduled provider appointments  Patient will continue actively participating in home health services as ordered  Patient will continue using assistive devices for fall prevention, and will continue practicing moving slowly when standing from seated position  Patient will continue regular monitoring/ recording of daily weights  Patient/ caregiver's/ family will continue reviewing educational material provided re: self-health management of chronic disease state of CHF  [facility] CM involvement in patient's care to continue with scheduled phone call in 2 weeks  [name], RN, BSN, [place] Care Management   [phone]

## 2016-11-04 NOTE — Patient Outreach (Signed)
[place] [facility]) Care Management  11/04/2016  EZRA DENNE [birth year] [id number]  Successful outreach call to patient's daughter, [name], on [facility] Consent form.  HIPAA details verified.   [name] reports they received notice from Dr Blenda Mounts office metoprolol tartrate 25 mg twice daily was sent into to patient's pharmacy.  [name] confirmed with patient's spouse, who manages patient's medications, they picked up metoprolol tartrate 25 mg.   At this time, patient's daughter denies other pharmacy related needs for patient.   She confirms she has [facility] Pharmacist phone number should new pharmacy needs arise.  Daughter encouraged to make sure patient takes his medications as prescribed.    Plan:  Will close pharmacy case at this time.   Patient's daughter aware she can contact [place] if needed.   Will send note to [place] to make her aware of pharmacy case closure.   [name], PharmD, [place] [phone]

## 2016-11-05 DIAGNOSIS — N2581 Secondary hyperparathyroidism of renal origin: Secondary | ICD-10-CM | POA: Diagnosis not present

## 2016-11-05 DIAGNOSIS — N186 End stage renal disease: Secondary | ICD-10-CM | POA: Diagnosis not present

## 2016-11-05 DIAGNOSIS — E1129 Type 2 diabetes mellitus with other diabetic kidney complication: Secondary | ICD-10-CM | POA: Diagnosis not present

## 2016-11-05 DIAGNOSIS — D631 Anemia in chronic kidney disease: Secondary | ICD-10-CM | POA: Diagnosis not present

## 2016-11-08 DIAGNOSIS — E1129 Type 2 diabetes mellitus with other diabetic kidney complication: Secondary | ICD-10-CM | POA: Diagnosis not present

## 2016-11-08 DIAGNOSIS — D631 Anemia in chronic kidney disease: Secondary | ICD-10-CM | POA: Diagnosis not present

## 2016-11-08 DIAGNOSIS — N186 End stage renal disease: Secondary | ICD-10-CM | POA: Diagnosis not present

## 2016-11-08 DIAGNOSIS — N2581 Secondary hyperparathyroidism of renal origin: Secondary | ICD-10-CM | POA: Diagnosis not present

## 2016-11-09 DIAGNOSIS — N186 End stage renal disease: Secondary | ICD-10-CM | POA: Diagnosis not present

## 2016-11-09 DIAGNOSIS — H3530 Unspecified macular degeneration: Secondary | ICD-10-CM | POA: Diagnosis not present

## 2016-11-09 DIAGNOSIS — D631 Anemia in chronic kidney disease: Secondary | ICD-10-CM | POA: Diagnosis not present

## 2016-11-09 DIAGNOSIS — I4891 Unspecified atrial fibrillation: Secondary | ICD-10-CM | POA: Diagnosis not present

## 2016-11-09 DIAGNOSIS — F329 Major depressive disorder, single episode, unspecified: Secondary | ICD-10-CM | POA: Diagnosis not present

## 2016-11-09 DIAGNOSIS — I509 Heart failure, unspecified: Secondary | ICD-10-CM | POA: Diagnosis not present

## 2016-11-10 DIAGNOSIS — N186 End stage renal disease: Secondary | ICD-10-CM | POA: Diagnosis not present

## 2016-11-10 DIAGNOSIS — E1129 Type 2 diabetes mellitus with other diabetic kidney complication: Secondary | ICD-10-CM | POA: Diagnosis not present

## 2016-11-10 DIAGNOSIS — N2581 Secondary hyperparathyroidism of renal origin: Secondary | ICD-10-CM | POA: Diagnosis not present

## 2016-11-10 DIAGNOSIS — D631 Anemia in chronic kidney disease: Secondary | ICD-10-CM | POA: Diagnosis not present

## 2016-11-11 ENCOUNTER — Ambulatory Visit
Admission: RE | Admit: 2016-11-11 | Discharge: 2016-11-11 | Disposition: A | Discharge status: Home / Self Care | Payer: Medicare Other | Source: Ambulatory Visit | Attending: Cardiovascular Disease | Admitting: Cardiovascular Disease

## 2016-11-11 ENCOUNTER — Ambulatory Visit (INDEPENDENT_AMBULATORY_CARE_PROVIDER_SITE_OTHER): Payer: Medicare Other | Admitting: Pharmacist

## 2016-11-11 ENCOUNTER — Encounter: Payer: Self-pay | Admitting: Cardiovascular Disease

## 2016-11-11 ENCOUNTER — Ambulatory Visit (INDEPENDENT_AMBULATORY_CARE_PROVIDER_SITE_OTHER): Payer: Medicare Other | Admitting: Cardiovascular Disease

## 2016-11-11 VITALS — BP 111/74 | HR 97 | Ht 70.5 in | Wt 203.0 lb

## 2016-11-11 DIAGNOSIS — I951 Orthostatic hypotension: Secondary | ICD-10-CM

## 2016-11-11 DIAGNOSIS — I5033 Acute on chronic diastolic (congestive) heart failure: Secondary | ICD-10-CM

## 2016-11-11 DIAGNOSIS — I495 Sick sinus syndrome: Secondary | ICD-10-CM | POA: Diagnosis not present

## 2016-11-11 DIAGNOSIS — I480 Paroxysmal atrial fibrillation: Secondary | ICD-10-CM

## 2016-11-11 DIAGNOSIS — Z7901 Long term (current) use of anticoagulants: Secondary | ICD-10-CM | POA: Diagnosis not present

## 2016-11-11 DIAGNOSIS — R0602 Shortness of breath: Secondary | ICD-10-CM | POA: Diagnosis not present

## 2016-11-11 DIAGNOSIS — J90 Pleural effusion, not elsewhere classified: Secondary | ICD-10-CM | POA: Diagnosis not present

## 2016-11-11 DIAGNOSIS — E7800 Pure hypercholesterolemia, unspecified: Secondary | ICD-10-CM

## 2016-11-11 DIAGNOSIS — I481 Persistent atrial fibrillation: Secondary | ICD-10-CM | POA: Diagnosis not present

## 2016-11-11 DIAGNOSIS — Z952 Presence of prosthetic heart valve: Secondary | ICD-10-CM | POA: Diagnosis not present

## 2016-11-11 DIAGNOSIS — I4819 Other persistent atrial fibrillation: Secondary | ICD-10-CM

## 2016-11-11 LAB — PRO B NATRIURETIC PEPTIDE: NT-PRO BNP: 18028 pg/mL — AB (ref 0–486)

## 2016-11-11 LAB — POCT INR: INR: 2.7

## 2016-11-11 NOTE — Patient Instructions (Addendum)
Medication Instructions:  Your physician recommends that you continue on your current medications as directed. Please refer to the Current Medication list given to you today.  Labwork: BNP TODAY   Testing/Procedures: A chest x-ray takes a picture of the organs and structures inside the chest, including the heart, lungs, and blood vessels. This test can show several things, including, whether the heart is enlarges; whether fluid is building up in the lungs; and whether pacemaker / defibrillator leads are still in place. [facility]   Follow-Up: Your physician recommends that you schedule a follow-up appointment in: [place] have been referred to Pulmonary. If you do not hear from the office you may call them directly at [phone]  If you need a refill on your cardiac medications before your next appointment, please call your pharmacy.

## 2016-11-11 NOTE — Progress Notes (Signed)
Cardiology Office Note   Date:  11/11/2016   ID:  Alexander [name], DOB May 30, 1940, MRN [id number]  PCP:  Deland Pretty, MD  Cardiologist:   Skeet Latch, MD  Electrophysiologist: Allegra Lai, MD  Chief Complaint  Patient presents with  . Follow-up    pt c/o chest pain    History of Present Illness: Alexander [name] is a 76 y.o. male with severe aortic stenosis s/p TAVR, ESRD on HD, hypotension on midodrine, diabetes mellitus, hyperlipidemia, carotid artery disease with complete occlusion of the L carotid, prior TIA and paroxysmal atrial fibrillation  who presents for follow up.  He was initially evaluated on 12/04/14 with hypotension requiring midodrine and syncope.  He had an echo that revealed LVEF 60-65% with grade 2 diastolic dysfunction, moderate AS and mild MR. He wore a 48 hour Holter 11/2014 that revealed episodes of dizziness associated with heart rates in the 50s. He had a PPM implanted 01/2015.  He had a repeat monitor 12/2014 that showed atrial fibrillation with occasional junctional escape.  He subsequently had a Lexiscan Cardiolite 01/2015 that was negative for ischemia but showed an area of distal inferior infarct vs artifact.  He saw Dr. Curt Bears on 10/06/15 where he was found to have been in atrial fibrillation since April.  He went into sinus rhythm briefly after DCCV.  Alexander [name] had an echo 06/21/16 that revealed LVEF 60-65% and moderate aortic stenosis with a mean gradient of 33 mmHg.  He continued to complain of fatigue and dyspnea, so he had a TEE 07/22/16 that revealed normal systolic function and an aortic valve that appeared severely stenoitic, though gradients indicated moderate aortic stenosis.  He continues to be very fatigued and short of breath.  He reports mild edema but denies orthopnea or PND.  He denies palpitations or recent syncope.  He has been completing full HD sessions without complication.   Mr. Alexander [name] underwent placement of a 29 mm Sapien 3 TAVR on  08/2016.  He was subsequently admitted to [facility] with volume overload in the setting of confusion about midodrine and metoprolol  He inadvertently continue taking midodrine and stopped taking metoprolol.   he required extra dialysis. He subsequently followed up with Dr. Angelena Form on 10/07/16 and was doing fairly well. He continued to struggle to get his dry weight regulated with dialysis.  Echo on 10/07/16 revealed LVEF 55-60% with mild LVH. The prostatic valve was functioning properly early without any significant stenosis or regurgitation.  He reports that lately he has been very short of breath even at rest.  He also endorses orthopnea and has been unable to lay flat.  He has been taking midodrine every morning.  His BP is in the 120s in the AM and 100 after HD.  He gets tired just walking down his driveway to catch the [employer] bus.  He has also been doing PT at home and gets tired very easily.  He denies chest pain or pressure.     Past Medical History:  Diagnosis Date  . Anemia   . Aortic stenosis 06/15/12   TEE - EF 27-74%; grade 1 diastolic dysfunction; mild/mod aortic valve stenosis; Mitral valve had calcified annulus, mild pulm htn PA peak pressure 35mmHg  . Barrett's esophagus 05/2003  . Bradycardia 2017   [employer] 2240 Assurity dual-lead pacemaker  . Carpal tunnel syndrome, bilateral 11/03/2015  . Colon polyps   . CVA (cerebral infarction)    2004/affected left side  . Depression   .  Diabetes mellitus without complication ([place])   . Diabetic peripheral neuropathy ([place]) 10/02/2015  . Diverticulosis   . Dyspnea    with exertion  . End stage renal disease ([place])    hemodialysis 3 times a week  . GERD (gastroesophageal reflux disease)   . Glaucoma   . Hyperlipidemia   . Hypertension   . Kidney stones   . Legally blind   . Macular degeneration    both eyes  . Orthostatic hypotension 09/09/2015  . Paroxysmal atrial fibrillation (HCC)   . Peptic ulcer    bleeding, 1969    . Presence of permanent cardiac pacemaker   . S/P epidural steroid injection    last  injection over 10 years ago  . Seasonal allergies   . Tubular adenoma of colon 07/2001    Past Surgical History:  Procedure Laterality Date  . AV FISTULA PLACEMENT  2009  . BACK SURGERY    . CARDIOVERSION N/A 11/13/2015   Procedure: CARDIOVERSION;  Surgeon: Troy Sine, MD;  Location: [facility] ENDOSCOPY;  Service: Cardiovascular;  Laterality: N/A;  . CARDIOVERSION N/A 01/13/2016   Procedure: CARDIOVERSION;  Surgeon: Will Meredith [name], MD;  Location: [place];  Service: Cardiovascular;  Laterality: N/A;  . [place]   right eye  . CYSTOSCOPY  several times   kidney stones  . EP IMPLANTABLE DEVICE N/A 03/11/2015   Procedure: Pacemaker Implant;  Surgeon: Will Meredith [name], MD;  [place];  Laterality: Left  . LAMINECTOMY  1969  . RIGHT/LEFT HEART CATH AND CORONARY ANGIOGRAPHY N/A 08/20/2016   Procedure: Right/Left Heart Cath and Coronary Angiography;  Surgeon: Burnell Blanks, MD;  Location: [place] CV LAB;  Service: Cardiovascular;  Laterality: N/A;  . TEE WITHOUT CARDIOVERSION N/A 07/22/2016   Procedure: TRANSESOPHAGEAL ECHOCARDIOGRAM (TEE);  Surgeon: Skeet Latch, MD;  Location: [place];  Service: Cardiovascular;  Laterality: N/A;  . TEE WITHOUT CARDIOVERSION N/A 08/31/2016   Procedure: TRANSESOPHAGEAL ECHOCARDIOGRAM (TEE);  Surgeon: Burnell Blanks, MD;  Location: [place];  Service: Open Heart Surgery;  Laterality: N/A;  . TONSILLECTOMY  1964  . TRANSCATHETER AORTIC VALVE REPLACEMENT, TRANSFEMORAL N/A 08/31/2016   Procedure: TRANSCATHETER AORTIC VALVE REPLACEMENT, TRANSFEMORAL;  Surgeon: Burnell Blanks, MD;  Location: [place];  Service: Open Heart Surgery;  Laterality: N/A;     Current Outpatient Prescriptions  Medication Sig Dispense Refill  . acetaminophen (TYLENOL) 325 MG tablet Take 2 tablets (650 mg total) by mouth every 6  (six) hours as needed for mild pain.    [name] atorvastatin (LIPITOR) 40 MG tablet Take 20 mg by mouth daily.    [name] b complex-vitamin c-folic acid (NEPHRO-VITE) 0.8 MG TABS Take 1 tablet by mouth daily.     . bromocriptine (PARLODEL) 5 MG capsule Take 5 mg by mouth at bedtime.     . calcium acetate (PHOSLO) 667 MG capsule Take 667 mg by mouth 4 (four) times daily.     . cetirizine (ZYRTEC) 10 MG tablet Take 10 mg by mouth at bedtime.     . cinacalcet (SENSIPAR) 30 MG tablet Take 30 mg by mouth daily.    . dorzolamide-timolol (COSOPT) 22.3-6.8 MG/ML ophthalmic solution Place 1 drop into the right eye 2 (two) times daily.    [name] FLUoxetine (PROZAC) 20 MG capsule Take 40 mg by mouth daily.     . metoprolol tartrate (LOPRESSOR) 25 MG tablet Take 1 tablet (25 mg total) by mouth 2 (two) times daily. 180 tablet 3  .  midodrine (PROAMATINE) 5 MG tablet Take 15mg  (3 tablets) three times weekly as needed for low blood pressure after dialysis. 30 tablet 3  . omeprazole (PRILOSEC) 20 MG capsule Take 20 mg by mouth daily.    . predniSONE (DELTASONE) 5 MG tablet Take 5 mg by mouth daily.   1  . warfarin (COUMADIN) 5 MG tablet Take 1-1.5 tablets by mouth daily as directed by coumadin clinic (Patient taking differently: Take 5-7.5 mg by mouth See admin instructions. Take 1-1.5 tablets by mouth daily as directed by coumadin clinic  Current dose is 5 mg (1 tablet) daily.) 120 tablet 1   No current facility-administered medications for this visit.     Allergies:   Penicillins; Codeine; and Tramadol    Social History:  The patient  reports that he quit smoking about 18 years ago. His smoking use included Cigarettes. He has a 90.00 pack-year smoking history. He has never used smokeless tobacco. He reports that he does not drink alcohol or use drugs.   Family History:  The patient's family history includes Cancer in his brother; Heart attack in his father; Heart disease in his sister; Hypertension in his father; Stomach  cancer in his mother; Stroke in his sister.    ROS:  Please see the history of present illness.   Otherwise, review of systems are positive for none.   All other systems are reviewed and negative.    PHYSICAL EXAM: VS:  BP 111/74   Pulse 97   Ht 5' 10.5" (1.791 m)   Wt 92.1 kg (203 lb)   BMI 28.72 kg/m  , BMI Body mass index is 28.72 kg/m.  GENERAL:  Tired appearing.  Mild respiratory distress. HEENT:  Pupils equal round and reactive, fundi not visualized, oral mucosa unremarkable NECK:  +JVD waveform within normal limits, carotid upstroke brisk and symmetric, no bruits LUNGS:  Clear to auscultation bilaterally.  No wheezes or rhonchi.  +Crackles at L base. HEART:  Irregularly irregular.  PMI not displaced or sustained,S1 and S2 within normal limits, no S3, no S4, no clicks, no rubs, no murmur. ABD:  Flat, positive bowel sounds normal in frequency in pitch, no bruits, no rebound, no guarding, no midline pulsatile mass, no hepatomegaly, no splenomegaly EXT:  2 plus pulses throughout, 1+ pitting edema, no cyanosis no clubbing SKIN:  No rashes no nodules NEURO:  Cranial nerves II through XII grossly intact, motor grossly intact throughout PSYCH:  Cognitively intact, oriented to person place and time   EKG:  EKG is ordered today. 09/09/15: Atrial fibrillation rate 108 bpm.  Prior inferior infarct. 01/16/15: Sinus bradycardia.  First degree heart block.  LAFB.  U waves. The ekg from 11/19/14 demonstrates sinus bradycardia at 49 bpm. First degree heart block.  08/06/16: Atrial fibrillation.  Rate 82 bpm.  Occcasional VP. 11/11/16: Atrial fibrillation. Rate 97 bpm. Left bundle branch block. CBC.  Lexiscan Cardiolite 01/28/15:  Nuclear stress EF: 67%.  The left ventricular ejection fraction is hyperdynamic (>65%).  There was no ST segment deviation noted during stress.  This is a low risk study.  Low risk stress nuclear study with a small, severe, predominantly fixed distal inferior  defect consistent with small prior infarct vs thinning; no significant ischemia; EF 67 with normal wall motion.  Echo 10/07/16: Study Conclusions  - Left ventricle: The cavity size was normal. Wall thickness was   increased in a pattern of mild LVH. Systolic function was normal.   The estimated ejection fraction was in the  range of 55% to 60%.   Wall motion was normal; there were no regional wall motion   abnormalities. Doppler parameters are consistent with high   ventricular filling pressure. - Aortic valve: A bioprosthesis was present. - Mitral valve: Calcified annulus. There was mild regurgitation.   Valve area by pressure half-time: 2.08 cm^2. - Left atrium: The atrium was mildly dilated. - Right atrium: The atrium was moderately dilated. - Pulmonary arteries: Systolic pressure was mildly increased. PA   peak pressure: 36 mm Hg (S).  Impressions:  - Normal LV systolic function; mild LVH; elevated LV filling   pressure; s/p AVR with normal mean gradient (9 mmHg) and   calculated AVA of 1.4 cm2; no AI; mild MR; biatrial enlargement;   mild TR with mildly elevated pulmonary pressure.  Recent Labs: 08/31/2016: ALT 24 09/01/2016: Magnesium 1.6 09/27/2016: BUN 14; Creatinine, Ser 4.62; Hemoglobin 10.0; Platelets 149; Potassium 3.3; Sodium 136 11/11/2016: NT-Pro BNP 18,028    Lipid Panel    Component Value Date/Time   CHOL 138 10/28/2015 1008   TRIG 158 (H) 10/28/2015 1008   HDL 30 (L) 10/28/2015 1008   CHOLHDL 4.6 10/28/2015 1008   VLDL 32 (H) 10/28/2015 1008   LDLCALC 76 10/28/2015 1008      Wt Readings from Last 3 Encounters:  11/11/16 92.1 kg (203 lb)  10/26/16 91 kg (200 lb 9.9 oz)  10/07/16 92.1 kg (203 lb)      Other studies Reviewed: Additional studies/ records that were reviewed today include: [facility] records Review of the above records demonstrates:  Please see elsewhere in the note.     ASSESSMENT AND PLAN:  # Severe aortic stenosis s/p TAVR:  #  Fatigue: # Shortness of breath: TAVR is functioning properly. He has increased edema, crackles and shortness of breath. I think he is going overloaded. We will check a BNP and a chest x-ray. Once we get these results I will call his dialysis center to see if more fluid can be pulled off with dialysis.  # Hypotension/syncope: Mr. Ishaq is doing much better but still requires midodrine.  He may need it more than once daily to successfully pull of volume.  # Bradycardia/longstanding persistent atrial fibrillation:  Mr. Tsosie had a cardioversion but only went into sinus rhythm temporarily.  He was on amiodarone at the time.  He is currently in atrial fibrillation and rates are controlled. Continue warfarin for anticoagulation.   # Hyperlipidemia: LDL 76 10/2015.  Continue atorvastatin.  Current medicines are reviewed at length with the patient today.  The patient does not have concerns regarding medicines.  The following changes have been made: None  Labs/ tests ordered today include:   Orders Placed This Encounter  Procedures  . DG Chest 2 View  . Pro b natriuretic peptide (BNP)  . Ambulatory referral to Pulmonology  . EKG 12-Lead     Disposition:   FU with Dr. Jonelle Sidle C. [place] in 1 months   Signed, Skeet Latch, MD  11/11/2016 5:35 PM    [place]

## 2016-11-12 ENCOUNTER — Telehealth: Payer: Self-pay | Admitting: *Deleted

## 2016-11-12 DIAGNOSIS — N2581 Secondary hyperparathyroidism of renal origin: Secondary | ICD-10-CM | POA: Diagnosis not present

## 2016-11-12 DIAGNOSIS — N186 End stage renal disease: Secondary | ICD-10-CM | POA: Diagnosis not present

## 2016-11-12 DIAGNOSIS — E1129 Type 2 diabetes mellitus with other diabetic kidney complication: Secondary | ICD-10-CM | POA: Diagnosis not present

## 2016-11-12 DIAGNOSIS — D631 Anemia in chronic kidney disease: Secondary | ICD-10-CM | POA: Diagnosis not present

## 2016-11-12 NOTE — Telephone Encounter (Signed)
Received call from patients daughter regarding his visit to dialysis. Patient told daughter that no extra fluid removed during dialysis as Dr Oval Linsey recommended to on call yesterday. Patient feeling same as yesterday Left message for Craig to call back

## 2016-11-12 NOTE — Telephone Encounter (Signed)
Spoke with Angie RN at dialysis center. Patient did have an extra kilo drawn off but 0.6 added back. She stated that she would be able to get him in tomorrow for additional dialysis if Dr Oval Linsey would like. Discussed with Dr Oval Linsey and she would like dialysis tomorrow and for patient to go to ED if worse. Called and discussed with daughter [name]. [name] does not think patient is worse than yesterday just no better. Alexander [name] to have her father go to ED if worse, verbalized understanding. Spoke with Angie RN and she will be in touch with daughter regarding dialysis in  the morning.

## 2016-11-13 DIAGNOSIS — E8779 Other fluid overload: Secondary | ICD-10-CM | POA: Diagnosis not present

## 2016-11-13 DIAGNOSIS — N186 End stage renal disease: Secondary | ICD-10-CM | POA: Diagnosis not present

## 2016-11-13 DIAGNOSIS — N2581 Secondary hyperparathyroidism of renal origin: Secondary | ICD-10-CM | POA: Diagnosis not present

## 2016-11-13 DIAGNOSIS — E8770 Fluid overload, unspecified: Secondary | ICD-10-CM | POA: Diagnosis not present

## 2016-11-15 DIAGNOSIS — N186 End stage renal disease: Secondary | ICD-10-CM | POA: Diagnosis not present

## 2016-11-15 DIAGNOSIS — N2581 Secondary hyperparathyroidism of renal origin: Secondary | ICD-10-CM | POA: Diagnosis not present

## 2016-11-15 DIAGNOSIS — D631 Anemia in chronic kidney disease: Secondary | ICD-10-CM | POA: Diagnosis not present

## 2016-11-15 DIAGNOSIS — E1129 Type 2 diabetes mellitus with other diabetic kidney complication: Secondary | ICD-10-CM | POA: Diagnosis not present

## 2016-11-16 DIAGNOSIS — F329 Major depressive disorder, single episode, unspecified: Secondary | ICD-10-CM | POA: Diagnosis not present

## 2016-11-16 DIAGNOSIS — H401133 Primary open-angle glaucoma, bilateral, severe stage: Secondary | ICD-10-CM | POA: Diagnosis not present

## 2016-11-16 DIAGNOSIS — I4891 Unspecified atrial fibrillation: Secondary | ICD-10-CM | POA: Diagnosis not present

## 2016-11-16 DIAGNOSIS — H3530 Unspecified macular degeneration: Secondary | ICD-10-CM | POA: Diagnosis not present

## 2016-11-16 DIAGNOSIS — N186 End stage renal disease: Secondary | ICD-10-CM | POA: Diagnosis not present

## 2016-11-16 DIAGNOSIS — H353232 Exudative age-related macular degeneration, bilateral, with inactive choroidal neovascularization: Secondary | ICD-10-CM | POA: Diagnosis not present

## 2016-11-16 DIAGNOSIS — I509 Heart failure, unspecified: Secondary | ICD-10-CM | POA: Diagnosis not present

## 2016-11-16 DIAGNOSIS — D631 Anemia in chronic kidney disease: Secondary | ICD-10-CM | POA: Diagnosis not present

## 2016-11-16 DIAGNOSIS — Z961 Presence of intraocular lens: Secondary | ICD-10-CM | POA: Diagnosis not present

## 2016-11-16 DIAGNOSIS — H43812 Vitreous degeneration, left eye: Secondary | ICD-10-CM | POA: Diagnosis not present

## 2016-11-17 DIAGNOSIS — E1129 Type 2 diabetes mellitus with other diabetic kidney complication: Secondary | ICD-10-CM | POA: Diagnosis not present

## 2016-11-17 DIAGNOSIS — N186 End stage renal disease: Secondary | ICD-10-CM | POA: Diagnosis not present

## 2016-11-17 DIAGNOSIS — D631 Anemia in chronic kidney disease: Secondary | ICD-10-CM | POA: Diagnosis not present

## 2016-11-17 DIAGNOSIS — I482 Chronic atrial fibrillation: Secondary | ICD-10-CM | POA: Diagnosis not present

## 2016-11-17 DIAGNOSIS — N2581 Secondary hyperparathyroidism of renal origin: Secondary | ICD-10-CM | POA: Diagnosis not present

## 2016-11-18 ENCOUNTER — Other Ambulatory Visit: Payer: Self-pay | Admitting: *Deleted

## 2016-11-18 ENCOUNTER — Encounter: Payer: Self-pay | Admitting: *Deleted

## 2016-11-18 DIAGNOSIS — F329 Major depressive disorder, single episode, unspecified: Secondary | ICD-10-CM | POA: Diagnosis not present

## 2016-11-18 DIAGNOSIS — D631 Anemia in chronic kidney disease: Secondary | ICD-10-CM | POA: Diagnosis not present

## 2016-11-18 DIAGNOSIS — H3530 Unspecified macular degeneration: Secondary | ICD-10-CM | POA: Diagnosis not present

## 2016-11-18 DIAGNOSIS — I509 Heart failure, unspecified: Secondary | ICD-10-CM | POA: Diagnosis not present

## 2016-11-18 DIAGNOSIS — F339 Major depressive disorder, recurrent, unspecified: Secondary | ICD-10-CM | POA: Diagnosis not present

## 2016-11-18 DIAGNOSIS — I4891 Unspecified atrial fibrillation: Secondary | ICD-10-CM | POA: Diagnosis not present

## 2016-11-18 DIAGNOSIS — N186 End stage renal disease: Secondary | ICD-10-CM | POA: Diagnosis not present

## 2016-11-18 NOTE — Patient Outreach (Addendum)
[facility]) Care Management [place] Telephone Outreach  11/18/2016  MARTIE FULGHAM [birth year] [id number]  Unsuccessful telephone outreach to [name], daughter/ caregiver, on [facility] CM written consent, of BRANSON KRANZ, 76 y.o.malereferred to [place] from [facility] telephonic RN CM MD referral. Patient has history including, but not limited to, PAD, Aortic stenosis with TAVR 08/31/16, ESRD on HD (M, W, F), A-Fib on coumadin, DM, CVA in 2004, CHF, macular degeneration with legal blindness, and multiple falls.  HIPAA compliant voice mail message left for patient's caregiver, requesting return call back.  Plan:  Will re-attempt [facility] CM telephone outreach next week if I do not hear back from patient's caregiver first.  [name], RN, BSN, [place] Coordinator [facility] Care Management  [phone]

## 2016-11-19 ENCOUNTER — Telehealth: Payer: Self-pay | Admitting: *Deleted

## 2016-11-19 ENCOUNTER — Ambulatory Visit: Payer: Self-pay | Admitting: *Deleted

## 2016-11-19 DIAGNOSIS — D631 Anemia in chronic kidney disease: Secondary | ICD-10-CM | POA: Diagnosis not present

## 2016-11-19 DIAGNOSIS — N186 End stage renal disease: Secondary | ICD-10-CM | POA: Diagnosis not present

## 2016-11-19 DIAGNOSIS — N2581 Secondary hyperparathyroidism of renal origin: Secondary | ICD-10-CM | POA: Diagnosis not present

## 2016-11-19 DIAGNOSIS — E1129 Type 2 diabetes mellitus with other diabetic kidney complication: Secondary | ICD-10-CM | POA: Diagnosis not present

## 2016-11-19 NOTE — Telephone Encounter (Signed)
I spoke with Mr Franek daughter. Patient remains very weak and short of breath. He refuses to go back to ED because they do nothing for him. The dialysis center told the daughter they decreased his dry weight but she did not think he was any better after the additional dialysis on Saturday. He is unable to come to office Monday am secondary to dialysis. Will forward to Dr Oval Linsey for review.

## 2016-11-22 ENCOUNTER — Encounter: Payer: Self-pay | Admitting: *Deleted

## 2016-11-22 ENCOUNTER — Other Ambulatory Visit: Payer: Self-pay | Admitting: *Deleted

## 2016-11-22 DIAGNOSIS — D631 Anemia in chronic kidney disease: Secondary | ICD-10-CM | POA: Diagnosis not present

## 2016-11-22 DIAGNOSIS — N186 End stage renal disease: Secondary | ICD-10-CM | POA: Diagnosis not present

## 2016-11-22 DIAGNOSIS — N2581 Secondary hyperparathyroidism of renal origin: Secondary | ICD-10-CM | POA: Diagnosis not present

## 2016-11-22 DIAGNOSIS — E1129 Type 2 diabetes mellitus with other diabetic kidney complication: Secondary | ICD-10-CM | POA: Diagnosis not present

## 2016-11-22 NOTE — Telephone Encounter (Signed)
If BP is low in the afternoon, increase midodrine to twice daily.  Please schedule f/u this week.

## 2016-11-22 NOTE — Telephone Encounter (Signed)
Patient scheduled for office visit tomorrow afternoon Per wife they have not been checking blood pressure at home, will start and discuss further at visit tomorrow.

## 2016-11-22 NOTE — Patient Outreach (Signed)
[facility]) Care Management [place] Telephone Outreach  11/22/2016  Alexander [name] 12/19/1940 [id number]  Successful telephone outreach to Alexander [name], daughter/ caregiver, on [facility] CM written consent, of Alexander [name],76 y.o.malereferred to [place] from [facility] telephonic RN CM MD referral. Patient has history including, but not limited to, PAD, Aortic stenosis with TAVR 08/31/16, ESRD on HD (M, W, F), A-Fib on coumadin, DM, CVA in 2004, CHF, macular degeneration with legal blindness, and multiple falls.HIPAA/ identity verified through patient's caregiver today.  Today Alexander [name] reports that patient is "doing okay but has had some recent episodes of increased shortness of breath."  Alexander [name] reports that she placed a call to patient's cardiologist last week to inform of same, and is waiting for a call-back from cardiology provider to schedule appointment; positive reinforcement was provided to caregiver for contacting cardiology provider promptly for her concerns.  Alexander [name] states that patient continues attending HD sessions on Mon-Wed-Fri and that his recent episodes of shortness of breath is usually resolved with patient resting.   Alexander [name] further reports:  -- patient continuing taking medications as prescribed; states that she recently took patient "last week" to his PCP, where they discussed patient's "bad attitude and harshness" with his spouse/ family members; reports that patient was taken off of his prozac and started on a "new drug, which is to start this week," stating that she can not at this time remember the name of the new medication, as her papers are not with her.  Alexander [name] denies questions/ concerns around patient's medications today, but admits that she is anxious to find out how the new medication will work, stating that patient "has been very harsh" when speaking to his spouse to and to herself.   -- [place] services for PT continue; reports patient  continues benefiting from/ participating in [facility] PT sessions, and says that the [facility] PT told patient and his spouse that [facility] nurse should also still be visiting with patient; Alexander [name] reports that the [facility] PT was to follow up on this matter, and I encouraged Alexander [name] to contact [place] to determine whether or not patient should have ongoing [facility] RN visits if the [facility] PT did not provide an update this week.  Alexander [name] verbalized understanding and agreement with this plan, and confirmed that she has the phone number for [facility].  -- no new falls; patient continues using walker with ambulation  -- confirms that she has continued reviewing information previously provided by [facility] CSW regarding options around assistance at home for patient and his spouse; reports that she has not yet begun process to obtain such services, however, also reports that thus far, she has been able to manage patient's care.  Encouraged her to continue considering her options for in-home care assistance, and she agrees to do so.  -- reports patient has continued monitoring and recording daily weights, as recommended during [facility] CM initial home visit.  Daily weights are managed by patient's spouse, as patient is blind and requires assistance around this; continues using audible scales.  States can not remember patient's exact weights at this time, but reports that she regularly follows up with patient and his spouse/ her mother regarding weights.  Reports patient's weights "have still been holding steady with only a 2-3 pound weight change" that is usually resolved after weekly HD sessions on M-W-F.  As noted above, reports that patient has had a "few episodes of increased shortness of breath" over the last week, and that  she has contacted patient's cardiologist to address.  Reports that patient's recent episodes of shortness of breath is usually resolved with patient resting.  Alexander [name] issues, concerns, or problems today. Iconfirmed that  she, patient/ familyhasmy direct phone number, themain [facility] CM office phone number, and the [facility] CM 24-hour nurse advice line phone number should needs arise prior to next [place] outreach with routine home visit scheduled for next week.  Plan:  Patient will take medications as prescribed and will attend all scheduled provider appointments  Patient will continue actively participating in home health services as ordered; caregiver will contact [facility] agency for questions about [facility] services currently in place  Patient will continue using assistive devices for fall prevention, and will continue practicing moving slowly when standing from seated position  Patient will continue regular monitoring/ recording of daily weights  Patient/ caregiver's/ family will continue reviewing educational material provided re: self-health management of chronic disease state of CHF  [facility] CM involvement in patient's care to continue with scheduled routine home visit next week.  [name], RN, BSN, [employer] [facility] Care Management  [phone]

## 2016-11-23 ENCOUNTER — Emergency Department (HOSPITAL_COMMUNITY): Payer: Medicare Other

## 2016-11-23 ENCOUNTER — Inpatient Hospital Stay (HOSPITAL_COMMUNITY)
Admission: EM | Admit: 2016-11-23 | Discharge: 2016-12-02 | DRG: 871 | Disposition: A | Discharge status: Skilled Nursing Facility | Payer: Medicare Other | Attending: Internal Medicine | Admitting: Internal Medicine

## 2016-11-23 ENCOUNTER — Telehealth: Payer: Self-pay | Admitting: Cardiovascular Disease

## 2016-11-23 ENCOUNTER — Ambulatory Visit: Payer: Medicare Other | Admitting: Cardiovascular Disease

## 2016-11-23 ENCOUNTER — Encounter (HOSPITAL_COMMUNITY): Payer: Self-pay | Admitting: Emergency Medicine

## 2016-11-23 DIAGNOSIS — E785 Hyperlipidemia, unspecified: Secondary | ICD-10-CM | POA: Diagnosis not present

## 2016-11-23 DIAGNOSIS — N186 End stage renal disease: Secondary | ICD-10-CM

## 2016-11-23 DIAGNOSIS — I120 Hypertensive chronic kidney disease with stage 5 chronic kidney disease or end stage renal disease: Secondary | ICD-10-CM | POA: Diagnosis present

## 2016-11-23 DIAGNOSIS — R5381 Other malaise: Secondary | ICD-10-CM | POA: Diagnosis not present

## 2016-11-23 DIAGNOSIS — K219 Gastro-esophageal reflux disease without esophagitis: Secondary | ICD-10-CM | POA: Diagnosis not present

## 2016-11-23 DIAGNOSIS — H3530 Unspecified macular degeneration: Secondary | ICD-10-CM | POA: Diagnosis present

## 2016-11-23 DIAGNOSIS — H548 Legal blindness, as defined in USA: Secondary | ICD-10-CM | POA: Diagnosis present

## 2016-11-23 DIAGNOSIS — A409 Streptococcal sepsis, unspecified: Principal | ICD-10-CM | POA: Diagnosis present

## 2016-11-23 DIAGNOSIS — Z947 Corneal transplant status: Secondary | ICD-10-CM | POA: Diagnosis not present

## 2016-11-23 DIAGNOSIS — E1151 Type 2 diabetes mellitus with diabetic peripheral angiopathy without gangrene: Secondary | ICD-10-CM | POA: Diagnosis present

## 2016-11-23 DIAGNOSIS — Z8711 Personal history of peptic ulcer disease: Secondary | ICD-10-CM

## 2016-11-23 DIAGNOSIS — Z8249 Family history of ischemic heart disease and other diseases of the circulatory system: Secondary | ICD-10-CM | POA: Diagnosis not present

## 2016-11-23 DIAGNOSIS — I739 Peripheral vascular disease, unspecified: Secondary | ICD-10-CM | POA: Diagnosis present

## 2016-11-23 DIAGNOSIS — D649 Anemia, unspecified: Secondary | ICD-10-CM | POA: Diagnosis present

## 2016-11-23 DIAGNOSIS — Z7901 Long term (current) use of anticoagulants: Secondary | ICD-10-CM

## 2016-11-23 DIAGNOSIS — B954 Other streptococcus as the cause of diseases classified elsewhere: Secondary | ICD-10-CM | POA: Diagnosis not present

## 2016-11-23 DIAGNOSIS — Z885 Allergy status to narcotic agent status: Secondary | ICD-10-CM | POA: Diagnosis not present

## 2016-11-23 DIAGNOSIS — R791 Abnormal coagulation profile: Secondary | ICD-10-CM | POA: Diagnosis present

## 2016-11-23 DIAGNOSIS — G9340 Encephalopathy, unspecified: Secondary | ICD-10-CM | POA: Diagnosis present

## 2016-11-23 DIAGNOSIS — Z992 Dependence on renal dialysis: Secondary | ICD-10-CM | POA: Diagnosis not present

## 2016-11-23 DIAGNOSIS — I509 Heart failure, unspecified: Secondary | ICD-10-CM | POA: Diagnosis not present

## 2016-11-23 DIAGNOSIS — M6282 Rhabdomyolysis: Secondary | ICD-10-CM | POA: Diagnosis present

## 2016-11-23 DIAGNOSIS — E1142 Type 2 diabetes mellitus with diabetic polyneuropathy: Secondary | ICD-10-CM | POA: Diagnosis not present

## 2016-11-23 DIAGNOSIS — A419 Sepsis, unspecified organism: Secondary | ICD-10-CM | POA: Diagnosis not present

## 2016-11-23 DIAGNOSIS — R6510 Systemic inflammatory response syndrome (SIRS) of non-infectious origin without acute organ dysfunction: Secondary | ICD-10-CM | POA: Diagnosis present

## 2016-11-23 DIAGNOSIS — B955 Unspecified streptococcus as the cause of diseases classified elsewhere: Secondary | ICD-10-CM | POA: Diagnosis not present

## 2016-11-23 DIAGNOSIS — Z880 Allergy status to penicillin: Secondary | ICD-10-CM

## 2016-11-23 DIAGNOSIS — R7881 Bacteremia: Secondary | ICD-10-CM | POA: Diagnosis present

## 2016-11-23 DIAGNOSIS — R2681 Unsteadiness on feet: Secondary | ICD-10-CM | POA: Diagnosis not present

## 2016-11-23 DIAGNOSIS — E860 Dehydration: Secondary | ICD-10-CM | POA: Diagnosis present

## 2016-11-23 DIAGNOSIS — Z87442 Personal history of urinary calculi: Secondary | ICD-10-CM

## 2016-11-23 DIAGNOSIS — Z8673 Personal history of transient ischemic attack (TIA), and cerebral infarction without residual deficits: Secondary | ICD-10-CM | POA: Diagnosis not present

## 2016-11-23 DIAGNOSIS — Z9889 Other specified postprocedural states: Secondary | ICD-10-CM

## 2016-11-23 DIAGNOSIS — I110 Hypertensive heart disease with heart failure: Secondary | ICD-10-CM | POA: Diagnosis not present

## 2016-11-23 DIAGNOSIS — E119 Type 2 diabetes mellitus without complications: Secondary | ICD-10-CM

## 2016-11-23 DIAGNOSIS — I951 Orthostatic hypotension: Secondary | ICD-10-CM | POA: Diagnosis present

## 2016-11-23 DIAGNOSIS — H409 Unspecified glaucoma: Secondary | ICD-10-CM | POA: Diagnosis present

## 2016-11-23 DIAGNOSIS — Z888 Allergy status to other drugs, medicaments and biological substances status: Secondary | ICD-10-CM | POA: Diagnosis not present

## 2016-11-23 DIAGNOSIS — Z823 Family history of stroke: Secondary | ICD-10-CM

## 2016-11-23 DIAGNOSIS — Z950 Presence of cardiac pacemaker: Secondary | ICD-10-CM | POA: Diagnosis not present

## 2016-11-23 DIAGNOSIS — Z800 Family history of malignant neoplasm of digestive organs: Secondary | ICD-10-CM

## 2016-11-23 DIAGNOSIS — N2581 Secondary hyperparathyroidism of renal origin: Secondary | ICD-10-CM | POA: Diagnosis not present

## 2016-11-23 DIAGNOSIS — R079 Chest pain, unspecified: Secondary | ICD-10-CM | POA: Diagnosis not present

## 2016-11-23 DIAGNOSIS — E1129 Type 2 diabetes mellitus with other diabetic kidney complication: Secondary | ICD-10-CM | POA: Diagnosis not present

## 2016-11-23 DIAGNOSIS — Z79899 Other long term (current) drug therapy: Secondary | ICD-10-CM

## 2016-11-23 DIAGNOSIS — D638 Anemia in other chronic diseases classified elsewhere: Secondary | ICD-10-CM | POA: Diagnosis present

## 2016-11-23 DIAGNOSIS — E1165 Type 2 diabetes mellitus with hyperglycemia: Secondary | ICD-10-CM | POA: Diagnosis present

## 2016-11-23 DIAGNOSIS — Z87891 Personal history of nicotine dependence: Secondary | ICD-10-CM

## 2016-11-23 DIAGNOSIS — I480 Paroxysmal atrial fibrillation: Secondary | ICD-10-CM | POA: Diagnosis present

## 2016-11-23 DIAGNOSIS — D631 Anemia in chronic kidney disease: Secondary | ICD-10-CM | POA: Diagnosis not present

## 2016-11-23 DIAGNOSIS — Z952 Presence of prosthetic heart valve: Secondary | ICD-10-CM

## 2016-11-23 DIAGNOSIS — Z953 Presence of xenogenic heart valve: Secondary | ICD-10-CM

## 2016-11-23 DIAGNOSIS — M25551 Pain in right hip: Secondary | ICD-10-CM | POA: Diagnosis not present

## 2016-11-23 DIAGNOSIS — R41841 Cognitive communication deficit: Secondary | ICD-10-CM | POA: Diagnosis not present

## 2016-11-23 DIAGNOSIS — M6281 Muscle weakness (generalized): Secondary | ICD-10-CM | POA: Diagnosis not present

## 2016-11-23 DIAGNOSIS — F32A Depression, unspecified: Secondary | ICD-10-CM | POA: Diagnosis present

## 2016-11-23 DIAGNOSIS — Z86010 Personal history of colonic polyps: Secondary | ICD-10-CM

## 2016-11-23 DIAGNOSIS — I248 Other forms of acute ischemic heart disease: Secondary | ICD-10-CM | POA: Diagnosis present

## 2016-11-23 DIAGNOSIS — I4891 Unspecified atrial fibrillation: Secondary | ICD-10-CM | POA: Diagnosis not present

## 2016-11-23 DIAGNOSIS — I6523 Occlusion and stenosis of bilateral carotid arteries: Secondary | ICD-10-CM | POA: Diagnosis not present

## 2016-11-23 DIAGNOSIS — I340 Nonrheumatic mitral (valve) insufficiency: Secondary | ICD-10-CM | POA: Diagnosis not present

## 2016-11-23 DIAGNOSIS — E1122 Type 2 diabetes mellitus with diabetic chronic kidney disease: Secondary | ICD-10-CM | POA: Diagnosis not present

## 2016-11-23 DIAGNOSIS — M542 Cervicalgia: Secondary | ICD-10-CM | POA: Diagnosis not present

## 2016-11-23 DIAGNOSIS — F329 Major depressive disorder, single episode, unspecified: Secondary | ICD-10-CM | POA: Diagnosis present

## 2016-11-23 DIAGNOSIS — I959 Hypotension, unspecified: Secondary | ICD-10-CM | POA: Diagnosis present

## 2016-11-23 DIAGNOSIS — M62838 Other muscle spasm: Secondary | ICD-10-CM | POA: Diagnosis present

## 2016-11-23 DIAGNOSIS — T380X5A Adverse effect of glucocorticoids and synthetic analogues, initial encounter: Secondary | ICD-10-CM | POA: Diagnosis present

## 2016-11-23 DIAGNOSIS — I350 Nonrheumatic aortic (valve) stenosis: Secondary | ICD-10-CM

## 2016-11-23 DIAGNOSIS — R0902 Hypoxemia: Secondary | ICD-10-CM | POA: Diagnosis present

## 2016-11-23 DIAGNOSIS — R51 Headache: Secondary | ICD-10-CM | POA: Diagnosis not present

## 2016-11-23 LAB — I-STAT TROPONIN, ED: Troponin i, poc: 0.07 ng/mL (ref 0.00–0.08)

## 2016-11-23 LAB — PROTIME-INR
INR: 3.08
PROTHROMBIN TIME: 31.5 s — AB (ref 11.4–15.2)

## 2016-11-23 LAB — BASIC METABOLIC PANEL
Anion gap: 16 — ABNORMAL HIGH (ref 5–15)
BUN: 40 mg/dL — AB (ref 6–20)
CHLORIDE: 95 mmol/L — AB (ref 101–111)
CO2: 25 mmol/L (ref 22–32)
CREATININE: 7.34 mg/dL — AB (ref 0.61–1.24)
Calcium: 8.4 mg/dL — ABNORMAL LOW (ref 8.9–10.3)
GFR calc Af Amer: 7 mL/min — ABNORMAL LOW (ref >60)
GFR, EST NON AFRICAN AMERICAN: 6 mL/min — AB (ref >60)
Glucose, Bld: 117 mg/dL — ABNORMAL HIGH (ref 65–99)
Potassium: 4 mmol/L (ref 3.5–5.1)
SODIUM: 136 mmol/L (ref 135–145)

## 2016-11-23 LAB — MAGNESIUM: MAGNESIUM: 1.5 mg/dL — AB (ref 1.7–2.4)

## 2016-11-23 LAB — CK: Total CK: 630 U/L — ABNORMAL HIGH (ref 49–397)

## 2016-11-23 LAB — CBC
HCT: 35.4 % — ABNORMAL LOW (ref 39.0–52.0)
Hemoglobin: 11.1 g/dL — ABNORMAL LOW (ref 13.0–17.0)
MCH: 30.9 pg (ref 26.0–34.0)
MCHC: 31.4 g/dL (ref 30.0–36.0)
MCV: 98.6 fL (ref 78.0–100.0)
PLATELETS: 123 10*3/uL — AB (ref 150–400)
RBC: 3.59 MIL/uL — ABNORMAL LOW (ref 4.22–5.81)
RDW: 17.5 % — AB (ref 11.5–15.5)
WBC: 13.5 10*3/uL — AB (ref 4.0–10.5)

## 2016-11-23 LAB — HEPATIC FUNCTION PANEL
ALK PHOS: 70 U/L (ref 38–126)
ALT: 37 U/L (ref 17–63)
AST: 55 U/L — ABNORMAL HIGH (ref 15–41)
Albumin: 3 g/dL — ABNORMAL LOW (ref 3.5–5.0)
BILIRUBIN DIRECT: 0.3 mg/dL (ref 0.1–0.5)
BILIRUBIN INDIRECT: 0.9 mg/dL (ref 0.3–0.9)
TOTAL PROTEIN: 6.9 g/dL (ref 6.5–8.1)
Total Bilirubin: 1.2 mg/dL (ref 0.3–1.2)

## 2016-11-23 LAB — I-STAT CG4 LACTIC ACID, ED: Lactic Acid, Venous: 1.23 mmol/L (ref 0.5–1.9)

## 2016-11-23 LAB — TROPONIN I: Troponin I: 0.09 ng/mL (ref <0.03)

## 2016-11-23 MED ORDER — VANCOMYCIN HCL IN DEXTROSE 1-5 GM/200ML-% IV SOLN: 1000.0000 mg | Freq: Once | INTRAVENOUS | Status: DC

## 2016-11-23 MED ORDER — ACETAMINOPHEN 500 MG PO TABS
1000.0000 mg | ORAL_TABLET | Freq: Once | ORAL | Status: AC
  Administered 2016-11-23: 1000 mg via ORAL
  Filled 2016-11-23: qty 2

## 2016-11-23 MED ORDER — FENTANYL CITRATE (PF) 100 MCG/2ML IJ SOLN
50.0000 ug | Freq: Once | INTRAMUSCULAR | Status: AC
  Administered 2016-11-23: 50 ug via INTRAVENOUS
  Filled 2016-11-23: qty 2

## 2016-11-23 MED ORDER — SODIUM CHLORIDE 0.9 % IV BOLUS (SEPSIS)
500.0000 mL | Freq: Once | INTRAVENOUS | Status: AC
  Administered 2016-11-23: 500 mL via INTRAVENOUS

## 2016-11-23 MED ORDER — DIAZEPAM 5 MG/ML IJ SOLN
2.5000 mg | Freq: Once | INTRAMUSCULAR | Status: AC
  Administered 2016-11-23: 2.5 mg via INTRAVENOUS
  Filled 2016-11-23: qty 2

## 2016-11-23 MED ORDER — MAGNESIUM SULFATE 2 GM/50ML IV SOLN
2.0000 g | Freq: Once | INTRAVENOUS | Status: AC
  Administered 2016-11-24: 2 g via INTRAVENOUS
  Filled 2016-11-23: qty 50

## 2016-11-23 MED ORDER — DEXTROSE 5 % IV SOLN
2.0000 g | Freq: Once | INTRAVENOUS | Status: AC
  Administered 2016-11-23: 2 g via INTRAVENOUS
  Filled 2016-11-23: qty 2

## 2016-11-23 MED ORDER — DEXTROSE 5 % IV SOLN: 2.0000 g | Freq: Once | INTRAVENOUS | Status: DC

## 2016-11-23 MED ORDER — DEXAMETHASONE SODIUM PHOSPHATE 10 MG/ML IJ SOLN
10.0000 mg | Freq: Once | INTRAMUSCULAR | Status: AC
  Administered 2016-11-24: 10 mg via INTRAVENOUS
  Filled 2016-11-23: qty 1

## 2016-11-23 MED ORDER — DILTIAZEM LOAD VIA INFUSION
10.0000 mg | Freq: Once | INTRAVENOUS | Status: AC
  Administered 2016-11-23: 10 mg via INTRAVENOUS
  Filled 2016-11-23: qty 10

## 2016-11-23 MED ORDER — DILTIAZEM HCL 100 MG IV SOLR
5.0000 mg/h | INTRAVENOUS | Status: DC
  Administered 2016-11-23: 5 mg/h via INTRAVENOUS
  Administered 2016-11-24: 10 mg/h via INTRAVENOUS
  Filled 2016-11-23 (×3): qty 100

## 2016-11-23 MED ORDER — VANCOMYCIN HCL 10 G IV SOLR
2000.0000 mg | Freq: Once | INTRAVENOUS | Status: AC
  Administered 2016-11-23: 2000 mg via INTRAVENOUS
  Filled 2016-11-23: qty 2000

## 2016-11-23 MED ORDER — LEVOFLOXACIN IN D5W 750 MG/150ML IV SOLN: 750.0000 mg | Freq: Once | INTRAVENOUS | Status: DC

## 2016-11-23 NOTE — ED Triage Notes (Signed)
Pt's dtr arrived and states he has been having left shoulder pain, intermittent CP. Although pt denies CP at this time. Pt has significant medical hx

## 2016-11-23 NOTE — ED Notes (Signed)
PT states that when his headache comes on is when his spasm is about to start. And when the pt heart rate jumps up to the 140 pt has total body spasms and jumps slightly off the bed.

## 2016-11-23 NOTE — Progress Notes (Deleted)
Cardiology Office Note   Date:  11/23/2016   ID:  Alexander, [name] May 17, 1940, MRN [id number]  PCP:  Alexander Pretty, MD  Cardiologist:   Alexander Latch, MD  Electrophysiologist: Alexander Lai, MD  No chief complaint on file.   History of Present Illness: Alexander [name] is a 76 y.o. male with severe aortic stenosis s/p TAVR, ESRD on HD, hypotension on midodrine, diabetes mellitus, hyperlipidemia, carotid artery disease with complete occlusion of the L carotid, prior TIA and paroxysmal atrial fibrillation  who presents for follow up.  He was initially evaluated on 12/04/14 with hypotension requiring midodrine and syncope.  He had an echo that revealed LVEF 60-65% with grade 2 diastolic dysfunction, moderate AS and mild MR. He wore a 48 hour Holter 11/2014 that revealed episodes of dizziness associated with heart rates in the 50s. He had a PPM implanted 01/2015.  He had a repeat monitor 12/2014 that showed atrial fibrillation with occasional junctional escape.  He subsequently had a Lexiscan Cardiolite 01/2015 that was negative for ischemia but showed an area of distal inferior infarct vs artifact.  He saw Dr. Curt [name] on 10/06/15 where he was found to have been in atrial fibrillation since April.  He went into sinus rhythm briefly after DCCV.  Alexander [name] had an echo 06/21/16 that revealed LVEF 60-65% and moderate aortic stenosis with a mean gradient of 33 mmHg.  He continued to complain of fatigue and dyspnea, so he had a TEE 07/22/16 that revealed normal systolic function and an aortic valve that appeared severely stenoitic, though gradients indicated moderate aortic stenosis.  Mr. Alexander [name] underwent placement of a 29 mm Sapien 3 TAVR on 08/2016.  He was subsequently admitted to [facility] with volume overload in the setting of confusion about midodrine and metoprolol  He inadvertently continue taking midodrine and stopped taking metoprolol.   He required extra dialysis. He subsequently  followed up with Dr. Angelena [name] on 10/07/16 and was doing fairly well. He continued to struggle to get his dry weight regulated with dialysis.  Echo on 10/07/16 revealed LVEF 55-60% with mild LVH. The prostatic valve was functioning properly early without any significant stenosis or regurgitation. At his last appointment he reported significant fatigue and shortness of breath. He also reported orthopnea. He had a chest x-ray 11/11/16 that revealed mild cardiomegaly and tiny pleural effusions consistent with mild CHF. BNP was 18,000. I called his dialysis session and asked that they lower his dry weight to try using increase the amount of volume removed with each session.     Past Medical History:  Diagnosis Date  . Anemia   . Aortic stenosis 06/15/12   TEE - EF 22-97%; grade 1 diastolic dysfunction; mild/mod aortic valve stenosis; Mitral valve had calcified annulus, mild pulm htn PA peak pressure 57mmHg  . Barrett's esophagus 05/2003  . Bradycardia 2017   [employer] 2240 Assurity dual-lead pacemaker  . Carpal tunnel syndrome, bilateral 11/03/2015  . Colon polyps   . CVA (cerebral infarction)    2004/affected left side  . Depression   . Diabetes mellitus without complication ([place])   . Diabetic peripheral neuropathy ([place]) 10/02/2015  . Diverticulosis   . Dyspnea    with exertion  . End stage renal disease ([place])    hemodialysis 3 times a week  . GERD (gastroesophageal reflux disease)   . Glaucoma   . Hyperlipidemia   . Hypertension   . Kidney stones   . Legally blind   .  Macular degeneration    both eyes  . Orthostatic hypotension 09/09/2015  . Paroxysmal atrial fibrillation ([facility])   . Peptic ulcer    bleeding, 1969  . Presence of permanent cardiac pacemaker   . S/P epidural steroid injection    last  injection over 10 years ago  . Seasonal allergies   . Tubular adenoma of colon 07/2001    Past Surgical History:  Procedure Laterality Date  . AV FISTULA PLACEMENT  2009  . BACK  SURGERY    . CARDIOVERSION N/A 11/13/2015   Procedure: CARDIOVERSION;  Surgeon: Alexander Sine, MD;  Location: [facility];  Service: Cardiovascular;  Laterality: N/A;  . CARDIOVERSION N/A 01/13/2016   Procedure: CARDIOVERSION;  Surgeon: Alexander Meredith [name], MD;  Location: [facility];  Service: Cardiovascular;  Laterality: N/A;  . [place]   right eye  . CYSTOSCOPY  several times   kidney stones  . EP IMPLANTABLE DEVICE N/A 03/11/2015   Procedure: Pacemaker Implant;  Surgeon: Alexander Meredith [name], MD;  [place];  Laterality: Left  . LAMINECTOMY  1969  . RIGHT/LEFT HEART CATH AND CORONARY ANGIOGRAPHY N/A 08/20/2016   Procedure: Right/Left Heart Cath and Coronary Angiography;  Surgeon: Alexander Blanks, MD;  Location: [facility];  Service: Cardiovascular;  Laterality: N/A;  . TEE WITHOUT CARDIOVERSION N/A 07/22/2016   Procedure: TRANSESOPHAGEAL ECHOCARDIOGRAM (TEE);  Surgeon: Alexander Latch, MD;  Location: [facility];  Service: Cardiovascular;  Laterality: N/A;  . TEE WITHOUT CARDIOVERSION N/A 08/31/2016   Procedure: TRANSESOPHAGEAL ECHOCARDIOGRAM (TEE);  Surgeon: Alexander Blanks, MD;  Location: [facility];  Service: Open Heart Surgery;  Laterality: N/A;  . TONSILLECTOMY  1964  . TRANSCATHETER AORTIC VALVE REPLACEMENT, TRANSFEMORAL N/A 08/31/2016   Procedure: TRANSCATHETER AORTIC VALVE REPLACEMENT, TRANSFEMORAL;  Surgeon: Alexander Blanks, MD;  Location: [facility];  Service: Open Heart Surgery;  Laterality: N/A;     Current Outpatient Prescriptions  Medication Sig Dispense Refill  . acetaminophen (TYLENOL) 325 MG tablet Take 2 tablets (650 mg total) by mouth every 6 (six) hours as needed for mild pain.    [name] atorvastatin (LIPITOR) 40 MG tablet Take 20 mg by mouth daily.    [name] b complex-vitamin c-folic acid (NEPHRO-VITE) 0.8 MG TABS Take 1 tablet by mouth daily.     . bromocriptine (PARLODEL) 5 MG capsule Take 5 mg by mouth at bedtime.      . calcium acetate (PHOSLO) 667 MG capsule Take 667 mg by mouth 4 (four) times daily.     . cetirizine (ZYRTEC) 10 MG tablet Take 10 mg by mouth at bedtime.     . cinacalcet (SENSIPAR) 30 MG tablet Take 30 mg by mouth daily.    . dorzolamide-timolol (COSOPT) 22.3-6.8 MG/ML ophthalmic solution Place 1 drop into the right eye 2 (two) times daily.    [name] FLUoxetine (PROZAC) 20 MG capsule Take 40 mg by mouth daily.     . metoprolol tartrate (LOPRESSOR) 25 MG tablet Take 1 tablet (25 mg total) by mouth 2 (two) times daily. 180 tablet 3  . midodrine (PROAMATINE) 5 MG tablet Take 15mg  (3 tablets) three times weekly as needed for low blood pressure after dialysis. 30 tablet 3  . omeprazole (PRILOSEC) 20 MG capsule Take 20 mg by mouth daily.    . predniSONE (DELTASONE) 5 MG tablet Take 5 mg by mouth daily.   1  . warfarin (COUMADIN) 5 MG tablet Take 1-1.5 tablets by mouth daily as directed by  coumadin clinic (Patient taking differently: Take 5-7.5 mg by mouth See admin instructions. Take 1-1.5 tablets by mouth daily as directed by coumadin clinic  Current dose is 5 mg (1 tablet) daily.) 120 tablet 1   No current facility-administered medications for this visit.     Allergies:   Penicillins; Codeine; and Tramadol    Social History:  The patient  reports that he quit smoking about 18 years ago. His smoking use included Cigarettes. He has a 90.00 pack-year smoking history. He has never used smokeless tobacco. He reports that he does not drink alcohol or use drugs.   Family History:  The patient's family history includes Cancer in his brother; Heart attack in his father; Heart disease in his sister; Hypertension in his father; Stomach cancer in his mother; Stroke in his sister.    ROS:  Please see the history of present illness.   Otherwise, review of systems are positive for none.   All other systems are reviewed and negative.    PHYSICAL EXAM: VS:  There were no vitals taken for this visit. , BMI  There is no height or weight on file to calculate BMI.  GENERAL:  Tired appearing.  Mild respiratory distress. HEENT:  Pupils equal round and reactive, fundi not visualized, oral mucosa unremarkable NECK:  +JVD waveform within normal limits, carotid upstroke brisk and symmetric, no bruits LUNGS:  Clear to auscultation bilaterally.  No wheezes or rhonchi.  +Crackles at L base. HEART:  Irregularly irregular.  PMI not displaced or sustained,S1 and S2 within normal limits, no S3, no S4, no clicks, no rubs, no murmur. ABD:  Flat, positive bowel sounds normal in frequency in pitch, no bruits, no rebound, no guarding, no midline pulsatile mass, no hepatomegaly, no splenomegaly EXT:  2 plus pulses throughout, 1+ pitting edema, no cyanosis no clubbing SKIN:  No rashes no nodules NEURO:  Cranial nerves II through XII grossly intact, motor grossly intact throughout PSYCH:  Cognitively intact, oriented to person place and time   EKG:  EKG is ordered today. 09/09/15: Atrial fibrillation rate 108 bpm.  Prior inferior infarct. 01/16/15: Sinus bradycardia.  First degree heart block.  LAFB.  U waves. The ekg from 11/19/14 demonstrates sinus bradycardia at 49 bpm. First degree heart block.  08/06/16: Atrial fibrillation.  Rate 82 bpm.  Occcasional VP. 11/11/16: Atrial fibrillation. Rate 97 bpm. Left bundle branch block. CBC.  Lexiscan Cardiolite 01/28/15:  Nuclear stress EF: 67%.  The left ventricular ejection fraction is hyperdynamic (>65%).  There was no ST segment deviation noted during stress.  This is a low risk study.  Low risk stress nuclear study with a small, severe, predominantly fixed distal inferior defect consistent with small prior infarct vs thinning; no significant ischemia; EF 67 with normal wall motion.  Echo 10/07/16: Study Conclusions  - Left ventricle: The cavity size was normal. Wall thickness was   increased in a pattern of mild LVH. Systolic function was normal.   The  estimated ejection fraction was in the range of 55% to 60%.   Wall motion was normal; there were no regional wall motion   abnormalities. Doppler parameters are consistent with high   ventricular filling pressure. - Aortic valve: A bioprosthesis was present. - Mitral valve: Calcified annulus. There was mild regurgitation.   Valve area by pressure half-time: 2.08 cm^2. - Left atrium: The atrium was mildly dilated. - Right atrium: The atrium was moderately dilated. - Pulmonary arteries: Systolic pressure was mildly increased. PA  peak pressure: 36 mm Hg (S).  Impressions:  - Normal LV systolic function; mild LVH; elevated LV filling   pressure; s/p AVR with normal mean gradient (9 mmHg) and   calculated AVA of 1.4 cm2; no AI; mild MR; biatrial enlargement;   mild TR with mildly elevated pulmonary pressure.  Recent Labs: 08/31/2016: ALT 24 09/01/2016: Magnesium 1.6 09/27/2016: BUN 14; Creatinine, Ser 4.62; Hemoglobin 10.0; Platelets 149; Potassium 3.3; Sodium 136 11/11/2016: NT-Pro BNP 18,028    Lipid Panel    Component Value Date/Time   CHOL 138 10/28/2015 1008   TRIG 158 (H) 10/28/2015 1008   HDL 30 (L) 10/28/2015 1008   CHOLHDL 4.6 10/28/2015 1008   VLDL 32 (H) 10/28/2015 1008   LDLCALC 76 10/28/2015 1008      Wt Readings from Last 3 Encounters:  11/11/16 92.1 kg (203 lb)  10/26/16 91 kg (200 lb 9.9 oz)  10/07/16 92.1 kg (203 lb)      Other studies Reviewed: Additional studies/ records that were reviewed today include: [facility] records Review of the above records demonstrates:  Please see elsewhere in the note.     ASSESSMENT AND PLAN:  # Severe aortic stenosis s/p TAVR:  # Fatigue: # Shortness of breath: TAVR is functioning properly. He has increased edema, crackles and shortness of breath. I think he is going overloaded. We Alexander check a BNP and a chest x-ray. Once we get these results I Alexander call his dialysis center to see if more fluid can be pulled off with  dialysis.  # Hypotension/syncope: Mr. Rosebrook is doing much better but still requires midodrine.  He may need it more than once daily to successfully pull of volume.  # Bradycardia/longstanding persistent atrial fibrillation:  Mr. Pittsley had a cardioversion but only went into sinus rhythm temporarily.  He was on amiodarone at the time.  He is currently in atrial fibrillation and rates are controlled. Continue warfarin for anticoagulation.   # Hyperlipidemia: LDL 76 10/2015.  Continue atorvastatin.  Current medicines are reviewed at length with the patient today.  The patient does not have concerns regarding medicines.  The following changes have been made: None  Labs/ tests ordered today include:   No orders of the defined types were placed in this encounter.    Disposition:   FU with Dr. Jonelle Sidle C. [place] in 1 months   Signed, Alexander Latch, MD  11/23/2016 8:51 AM    [place]

## 2016-11-23 NOTE — H&P (Addendum)
History and Physical    Alexander [name]:[id number] DOB: May 10, 1940 DOA: 11/23/2016  PCP: Deland Pretty, MD   Patient coming from: home.  I have personally briefly reviewed patient's old medical records in [place]  Chief Complaint: Back and chest pain.  HPI: Alexander [name] is a 76 y.o. male with medical history significant of anemia, aortic stenosis, PUD, GERD, Barrett's esophagus, carpal tunnel syndrome, paroxysmal atrial fibrillation, history of bradycardia (S/Pdual lead pacemaker placement), CVA, depression, type 2 diabetes, diabetic peripheral neuropathy, diverticulosis, ESRD on hemodialysis, glaucoma, hyperlipidemia, hypertension, nephrolithiasis, macular degeneration/legally blind, seasonal allergies, tubular adenoma of the colon who is coming to the emergency department due to progressively worse back pain, chest pain, left shoulder pain, muscle spasms of all extremities since yesterday after he finished hemodialysis. He had chills and fatigue yesterday, but no fever. No earache, rhinorrhea, sore throat, but positive productive cough for several days. No abdominal pain, but positive nausea, no emesis, no diarrhea, no constipation, no dysuria, no frequency. History is mainly provided by his daughter Alexander [name].  ED Course: Initial vital signs temperature 98.7, pulse ox 109, blood pressure 104/67 mmHg, respirations 15 and O2 sat 97% on room air. While in the ER, the patient developed a headache and a 103.14F fever. He subsequently had his A. fib go into RVR. He was started on a Cardizem infusion and IV antibiotics. He was given cefepime and vancomycin. No source of infection was obvious and LP was considered by Dr. Ellender Hose, but was deferred due to elevated INR. His WBC was 13.5 without differential available, hemoglobin 11.1 g/dL and platelets 123. PTT 31.5 seconds and INR 3.08. CMP showed BUN of 40, creatinine of 7.34 and glucose of 117 mg/dL. Albumin was 3.0 g/dL and AST mildly elevated at  55. All other values of CMP were unremarkable. Troponin level was 0.07 and a follow-up level was 0.09 ng/mL.  Imaging: Chest radiograph, CT chest, CT head and CT neck did not show any significant acute pathology that would explain his symptoms, but they had numerous chronic findings. Please see images sent for audiology reports for further detail.  Review of Systems: As per HPI otherwise 10 point review of systems negative.    Past Medical History:  Diagnosis Date  . Anemia   . Aortic stenosis 06/15/12   TEE - EF 01-02%; grade 1 diastolic dysfunction; mild/mod aortic valve stenosis; Mitral valve had calcified annulus, mild pulm htn PA peak pressure 29mmHg  . Barrett's esophagus 05/2003  . Bradycardia 2017   [employer] 2240 Assurity dual-lead pacemaker  . Carpal tunnel syndrome, bilateral 11/03/2015  . Colon polyps   . CVA (cerebral infarction)    2004/affected left side  . Depression   . Diabetes mellitus without complication ([place])   . Diabetic peripheral neuropathy ([place]) 10/02/2015  . Diverticulosis   . Dyspnea    with exertion  . End stage renal disease ([place])    hemodialysis 3 times a week  . GERD (gastroesophageal reflux disease)   . Glaucoma   . Hyperlipidemia   . Hypertension   . Kidney stones   . Legally blind   . Macular degeneration    both eyes  . Orthostatic hypotension 09/09/2015  . Paroxysmal atrial fibrillation (HCC)   . Peptic ulcer    bleeding, 1969  . Presence of permanent cardiac pacemaker   . S/P epidural steroid injection    last  injection over 10 years ago  . Seasonal allergies   . Tubular adenoma  of colon 07/2001    Past Surgical History:  Procedure Laterality Date  . AV FISTULA PLACEMENT  2009  . BACK SURGERY    . CARDIOVERSION N/A 11/13/2015   Procedure: CARDIOVERSION;  Surgeon: Troy Sine, MD;  [facility] END[facility]COPY;  Service: Cardiovascular;  Laterality: N/A;  . CARDIOVERSION N/A 01/13/2016   Procedure: CARDIOVERSION;  Surgeon: Will  Meredith [name], MD;  Location: [place];  Service: Cardiovascular;  Laterality: N/A;  . [place]   right eye  . CYSTOSCOPY  several times   kidney stones  . EP IMPLANTABLE DEVICE N/A 03/11/2015   Procedure: Pacemaker Implant;  Surgeon: Will Meredith [name], MD;  [place];  Laterality: Left  . LAMINECTOMY  1969  . RIGHT/LEFT HEART CATH AND CORONARY ANGIOGRAPHY N/A 08/20/2016   Procedure: Right/Left Heart Cath and Coronary Angiography;  Surgeon: Burnell Blanks, MD;  Location: [place] [facility] LAB;  Service: Cardiovascular;  Laterality: N/A;  . TEE WITHOUT CARDIOVERSION N/A 07/22/2016   Procedure: TRANSESOPHAGEAL ECHOCARDIOGRAM (TEE);  Surgeon: Skeet Latch, MD;  Location: [place];  Service: Cardiovascular;  Laterality: N/A;  . TEE WITHOUT CARDIOVERSION N/A 08/31/2016   Procedure: TRANSESOPHAGEAL ECHOCARDIOGRAM (TEE);  Surgeon: Burnell Blanks, MD;  Location: [place];  Service: Open Heart Surgery;  Laterality: N/A;  . TONSILLECTOMY  1964  . TRANSCATHETER AORTIC VALVE REPLACEMENT, TRANSFEMORAL N/A 08/31/2016   Procedure: TRANSCATHETER AORTIC VALVE REPLACEMENT, TRANSFEMORAL;  Surgeon: Burnell Blanks, MD;  Location: [place];  Service: Open Heart Surgery;  Laterality: N/A;     reports that he quit smoking about 18 years ago. His smoking use included Cigarettes. He has a 90.00 pack-year smoking history. He has never used smokeless tobacco. He reports that he does not drink alcohol or use drugs.  Allergies  Allergen Reactions  . Penicillins Swelling    SWELLING REACTION UNSPECIFIED   PATIENT HAD A PCN REACTION WITH IMMEDIATE RASH, FACIAL/TONGUE/THROAT SWELLING, SOB, OR LIGHTHEADEDNESS WITH HYPOTENSION:  #  #  #  YES  #  #  #   Has patient had a PCN reaction causing severe rash involving mucus membranes or skin necrosis: No Has patient had a PCN reaction that required hospitalization No Has patient had a PCN reaction occurring within  the last 10 years: No If all of the above answers are "NO", then may proceed with Cephalosporin use.  . Codeine Nausea Only  . Tramadol Nausea Only    Family History  Problem Relation Age of Onset  . Stomach cancer Mother   . Hypertension Father        Died of heart attack  . Heart attack Father   . Stroke Sister   . Heart disease Sister   . Cancer Brother   . Colon cancer Neg Hx     Prior to Admission medications   Medication Sig Start Date End Date Taking? Authorizing Provider  acetaminophen (TYLENOL) 325 MG tablet Take 2 tablets (650 mg total) by mouth every 6 (six) hours as needed for mild pain. 09/02/16  Yes Lars Pinks M, PA-C  atorvastatin (LIPITOR) 40 MG tablet Take 20 mg by mouth daily.   Yes [provider]  b complex-vitamin c-folic acid (NEPHRO-VITE) 0.8 MG TABS Take 1 tablet by mouth daily.    Yes [provider]  bromocriptine (PARLODEL) 5 MG capsule Take 5 mg by mouth at bedtime.  11/30/10  Yes [provider]  calcium acetate (PHOSLO) 667 MG capsule Take 667 mg by mouth  4 (four) times daily.    Yes [provider]  cetirizine (ZYRTEC) 10 MG tablet Take 10 mg by mouth at bedtime.    Yes [provider]  cinacalcet (SENSIPAR) 30 MG tablet Take 30 mg by mouth every evening.    Yes [provider]  dorzolamide-timolol (COSOPT) 22.3-6.8 MG/ML ophthalmic solution Place 1 drop into the right eye 2 (two) times daily. 08/13/13  Yes [provider]  metoprolol tartrate (LOPRESSOR) 25 MG tablet Take 1 tablet (25 mg total) by mouth 2 (two) times daily. 10/29/16  Yes Skeet Latch, MD  midodrine (PROAMATINE) 5 MG tablet Take 15mg  (3 tablets) three times weekly as needed for low blood pressure after dialysis. 10/19/16  Yes Skeet Latch, MD  omeprazole (PRILOSEC) 20 MG capsule Take 20 mg by mouth daily.   Yes [provider]  predniSONE (DELTASONE) 5 MG tablet Take 5 mg by mouth daily.  04/27/16  Yes  [provider]  sertraline (ZOLOFT) 50 MG tablet Take 50 mg by mouth daily. 11/18/16  Yes [provider]  warfarin (COUMADIN) 5 MG tablet Take 1-1.5 tablets by mouth daily as directed by coumadin clinic Patient taking differently: Take 5-7.5 mg by mouth See admin instructions. Take 1-1.5 tablets by mouth daily as directed by coumadin clinic  Current dose is 5 mg (1 tablet) daily. 03/08/16  Yes Skeet Latch, MD    Physical Exam: Vitals:   11/23/16 2054 11/23/16 2100 11/23/16 2255 11/23/16 2256  BP: (!) 109/96 (!) 124/105 102/63 101/67  Pulse: (!) 123 (!) 128 (!) 122 (!) 104  Resp: (!) 28 (!) 21 (!) 21 (!) 21  Temp:      TempSrc:      SpO2: 95% 97% 92% 95%  Weight:      Height:        Constitutional: Mildly febrile, otherwise in NAD, calm, comfortable Eyes: PERRL, lids and conjunctivae normal ENMT: Mucous membranes are mildly dry. Posterior pharynx clear of any exudate or lesions. Neck: normal, supple, no masses, no thyromegaly Respiratory: Decreased breath sounds on bases, clear to auscultation bilaterally, no wheezing, no crackles. Normal respiratory effort. No accessory muscle use.  Cardiovascular: Irregularly irregular, no murmurs / rubs / gallops. No extremity edema. 2+ pedal pulses. No carotid bruits.  Abdomen: Soft, no tenderness, no masses palpated. No hepatosplenomegaly. Bowel sounds positive.  Musculoskeletal: no clubbing / cyanosis. Good ROM, no contractures. Normal muscle tone.  Skin: Positive ecchymosis areas on extremities. Neurologic: CN 2-12 grossly intact. Sensation intact, DTR normal. Strength 5/5 in all 4.  Psychiatric: Alert and oriented x 2, Partially oriented to time and situation. His daughter Alexander [name] provides most of the history of present illness.     Labs on Admission: I have personally reviewed following labs and imaging studies  CBC:  Recent Labs Lab 11/23/16 1546  WBC 13.5*  HGB 11.1*  HCT 35.4*  MCV 98.6  PLT 123*    Basic Metabolic Panel:  Recent Labs Lab 11/23/16 1546 11/23/16 1936  NA 136  --   K 4.0  --   CL 95*  --   CO2 25  --   GLUCOSE 117*  --   BUN 40*  --   CREATININE 7.34*  --   CALCIUM 8.4*  --   MG  --  1.5*   GFR: Estimated Creatinine Clearance: 9.6 mL/min (A) (by C-G formula based on SCr of 7.34 mg/dL (H)). Liver Function Tests:  Recent Labs Lab 11/23/16 1936  AST 55*  ALT  37  ALKPHOS 70  BILITOT 1.2  PROT 6.9  ALBUMIN 3.0*   No results for input(s): LIPASE, AMYLASE in the last 168 hours. No results for input(s): AMMONIA in the last 168 hours. Coagulation Profile:  Recent Labs Lab 11/23/16 1936  INR 3.08   Cardiac Enzymes:  Recent Labs Lab 11/23/16 1936  CKTOTAL 630*  TROPONINI 0.09*   BNP (last 3 results)  Recent Labs  11/11/16 1143  PROBNP 18,028*   HbA1C: No results for input(s): HGBA1C in the last 72 hours. CBG: No results for input(s): GLUCAP in the last 168 hours. Lipid Profile: No results for input(s): CHOL, HDL, LDLCALC, TRIG, CHOLHDL, LDLDIRECT in the last 72 hours. Thyroid Function Tests: No results for input(s): TSH, T4TOTAL, FREET4, T3FREE, THYROIDAB in the last 72 hours. Anemia Panel: No results for input(s): VITAMINB12, FOLATE, FERRITIN, TIBC, IRON, RETICCTPCT in the last 72 hours. Urine analysis:    Component Value Date/Time   COLORURINE YELLOW 11/12/2014 1318   APPEARANCEUR CLEAR 11/12/2014 1318   LABSPEC 1.016 11/12/2014 1318   PHURINE 5.5 11/12/2014 1318   GLUCOSEU NEGATIVE 11/12/2014 1318   HGBUR MODERATE (A) 11/12/2014 1318   BILIRUBINUR SMALL (A) 11/12/2014 1318   KETONESUR NEGATIVE 11/12/2014 1318   PROTEINUR 100 (A) 11/12/2014 1318   UROBILINOGEN 0.2 11/12/2014 1318   NITRITE NEGATIVE 11/12/2014 1318   LEUKOCYTESUR MODERATE (A) 11/12/2014 1318    Radiological Exams on Admission: Dg Chest 2 View  Result Date: 11/23/2016 CLINICAL DATA:  76 year old male with chest pain, shortness of breath on exertion and  painful back spasms for 2 days. EXAM: CHEST  2 VIEW COMPARISON:  11/11/2016 and earlier. FINDINGS: Upright AP and lateral views of the chest. Stable right chest dual lead cardiac pacemaker. Prior TAVR. Stable cardiac size and mediastinal contours. Lower lung volumes on the AP view. No pneumothorax, pleural effusion or confluent pulmonary opacity. Mild increased pulmonary vascularity but no overt edema. No acute osseous abnormality identified. Calcified aortic atherosclerosis. Negative visible bowel gas pattern. IMPRESSION: 1. Increased pulmonary vascular congestion without overt pulmonary edema. 2. No other No acute cardiopulmonary abnormality. 3.  Aortic Atherosclerosis (ICD10-I70.0). Electronically Signed   By: Genevie Ann M.D.   On: 11/23/2016 16:11   Ct Head Wo Contrast  Result Date: 11/23/2016 CLINICAL DATA:  Headache and intermittent chest pain. EXAM: CT HEAD WITHOUT CONTRAST CT CERVICAL SPINE WITHOUT CONTRAST TECHNIQUE: Multidetector CT imaging of the head and cervical spine was performed following the standard protocol without intravenous contrast. Multiplanar CT image reconstructions of the cervical spine were also generated. COMPARISON:  09/27/2016 head CT and 10/05/2015 CT cervical spine FINDINGS: CT HEAD FINDINGS Brain: Chronic left parietooccipital and inferior left temporal lobe infarcts with encephalomalacia. No acute intracranial hemorrhage, hydrocephalus, intra-axial mass nor extra-axial collections. No new large vascular territory infarction. Patchy small vessel ischemic disease of periventricular white matter likely reflects chronic small vessel ischemia. Vascular: No hyperdense vessel or unexpected calcification. Skull: Negative for fracture or focal lesion. Sinuses/Orbits: Ocular surgeries are noted bilaterally with scleral band on the right and lens replacement bilaterally. Other: None CT CERVICAL SPINE FINDINGS Alignment: Chronic anterolisthesis of C3 on C4, grade 1 up to 2.4 mm. Slight  reversal cervical lordosis is again noted with degenerative disc disease from C4 through C7. Skull base and vertebrae: Negative for acute fracture. Osteoarthritis of the atlantooccipital and atlantodental intervals. Soft tissues and spinal canal: No prevertebral fluid or swelling. No visible canal hematoma. Disc levels: Mild disc space narrowing C2-3 and C3-4 with marked disc  space narrowing C4 through C7. Small posterior marginal osteophytes and uncovertebral joint spurring is noted from C4 through C7. Multilevel degenerative facet arthropathy is seen as before. Upper chest: No acute pulmonary abnormality. Other: Moderate bilateral extracranial carotid arteriosclerosis. IMPRESSION: 1. Chronic left parieto-occipital and inferior left temporal lobe infarcts with encephalomalacia. 2. Chronic small vessel ischemic disease of periventricular white matter. No acute intracranial abnormality. 3. Cervical spondylosis, chronic in appearance without acute osseous abnormality. 4. Dense bilateral extracranial carotid arteriosclerosis, previously worked up with [place] in 2016. Electronically Signed   By: Ashley Royalty M.D.   On: 11/23/2016 22:45   Ct Chest Wo Contrast  Result Date: 11/23/2016 CLINICAL DATA:  Intermittent chest pain EXAM: CT CHEST WITHOUT CONTRAST TECHNIQUE: Multidetector CT imaging of the chest was performed following the standard protocol without IV contrast. COMPARISON:  Radiograph 11/23/2016, CT 08/24/2016 FINDINGS: Cardiovascular: Limited evaluation without intravenous contrast. Ectasia of the ascending segment, measuring up to 3.8 cm. Atherosclerotic vascular calcification. Interval aortic valve replacement. Coronary artery calcifications. Intracardiac pacing leads. No pericardial effusion. Mild cardiomegaly. Mediastinum/Nodes: Stable mildly prominent mediastinal lymph nodes with fatty hilus. Midline trachea. 8 mm hypodense nodule left lobe of thyroid. Esophagus within normal limits. Lungs/Pleura: Mild  apical emphysema. No consolidation, pleural effusion or pneumothorax. Upper Abdomen: Partially visualized atrophic kidneys. Musculoskeletal: No acute or suspicious bone lesion. IMPRESSION: 1. Minimal apical emphysema. No acute pulmonary infiltrate, pleural effusion or pneumothorax 2. Interval aortic valve replacement.  Cardiomegaly. 3. Stable mildly prominent mediastinal lymph nodes. 4. Atrophic kidneys Aortic Atherosclerosis (ICD10-I70.0) and Emphysema (ICD10-J43.9). Electronically Signed   By: Donavan Foil M.D.   On: 11/23/2016 22:48   Ct Cervical Spine Wo Contrast  Result Date: 11/23/2016 CLINICAL DATA:  Neck pain EXAM: CT CERVICAL SPINE WITHOUT CONTRAST TECHNIQUE: Multidetector CT imaging of the cervical spine was performed without intravenous contrast. Multiplanar CT image reconstructions were also generated. COMPARISON:  10/05/2015 CT FINDINGS: This report was also included as part of the same day head CT report but could not be merged. The contents of the report and impression on the same. Alignment: Chronic anterolisthesis of C3 on C4, grade 1 up to 2.4 mm. Slight reversal cervical lordosis is again noted with degenerative disc disease from C4 through C7. Skull base and vertebrae: Negative for acute fracture. Osteoarthritis of the atlantooccipital and atlantodental intervals. Soft tissues and spinal canal: No prevertebral fluid or swelling. No visible canal hematoma. Disc levels: Mild disc space narrowing C2-3 and C3-4 with marked disc space narrowing C4 through C7. Small posterior marginal osteophytes and uncovertebral joint spurring is noted from C4 through C7. Multilevel degenerative facet arthropathy is seen as Before. Upper chest: No acute pulmonary abnormality. Other: Moderate bilateral extracranial carotid arteriosclerosis. IMPRESSION: 1. Cervical spondylosis, chronic in appearance without acute osseous abnormality. 2. Dense bilateral extracranial carotid arteriosclerosis, previously worked up  with [place] in 2016. Electronically Signed   By: Ashley Royalty M.D.   On: 11/23/2016 22:52   10/07/2016 echocardiogram complete ------------------------------------------------------------------- LV EF: 55% -   60%  ------------------------------------------------------------------- Indications:      Aortic Valve Disorder (I35.0).  ------------------------------------------------------------------- History:   PMH:  s/p TAVR.  ------------------------------------------------------------------- Study Conclusions  - Left ventricle: The cavity size was normal. Wall thickness was   increased in a pattern of mild LVH. Systolic function was normal.   The estimated ejection fraction was in the range of 55% to 60%.   Wall motion was normal; there were no regional wall motion   abnormalities. Doppler parameters are consistent with high  ventricular filling pressure. - Aortic valve: A bioprosthesis was present. - Mitral valve: Calcified annulus. There was mild regurgitation.   Valve area by pressure half-time: 2.08 cm^2. - Left atrium: The atrium was mildly dilated. - Right atrium: The atrium was moderately dilated. - Pulmonary arteries: Systolic pressure was mildly increased. PA   peak pressure: 36 mm Hg (S).  Impressions:  - Normal LV systolic function; mild LVH; elevated LV filling   pressure; s/p AVR with normal mean gradient (9 mmHg) and   calculated AVA of 1.4 cm2; no AI; mild MR; biatrial enlargement;   mild TR with mildly elevated pulmonary pressure.   EKG: Independently reviewed Vent. rate 114 BPM PR interval * ms QRS duration 126 ms QT/QTc 386/532 ms P-R-T axes * -60 113 Atrial fibrillation with rapid ventricular response Left axis deviation Non-specific intra-ventricular conduction block Cannot rule out Anteroseptal infarct , age undetermined T wave abnormality, consider lateral ischemia  Assessment/Plan Principal Problem:   Atrial fibrillation with RVR  (HCC) CHA?DS?-VASc Score of at least 7. Admit to stepdown/inpatient Continue supplemental oxygen. Continue Cardizem infusion. Continue metoprolol for rate control. Trend troponin levels. Magnesium replacement has been ordered. Warfarin per pharmacy.  Active Problems:   SIRS (systemic inflammatory response syndrome) (HCC) Unknown source at this time. There is concern for meningitis given nausea and headache with no other source. However, given the patient's INR, LP was deferred in the emergency department. Start meropenem per pharmacy. Continue vancomycin per pharmacy. Follow-up blood cultures and sensitivity. Consider infectious diseases evaluation if fever persists.    Anemia, unspecified Monitor hematocrit and hemoglobin. Erythropoietin per nephrology.    ESRD (end stage renal disease) on dialysis [facility]) Left message at inpatient hemodialysis line. HD orders per nephrology. Monitor BUN, creatinine and electrolytes.    Hyperlipidemia Continue atorvastatin 40 mg by mouth daily. Monitor LFTs as needed. Fasting lipid profile to be follow as an outpatient.    Depression Continue Zoloft 50 mg by mouth daily.    Glaucoma Continue Cosopt drops.    GERD (gastroesophageal reflux disease) Pantoprazole 40 mg by mouthdaily.    Hypomagnesemia Repleted.    Type 2 diabetes mellitus (HCC) Carbohydrate modified diet CBG monitoring with meals and at bedtime.    DVT prophylaxis: on warfarin. Code Status: full code. Family Communication: His daughter Alexander [name] was present in the emergency department. Disposition Plan: admit for rate control, IV antibiotic coverage and further workup. Consults called: left message at hemodialysis voicemail line. Admission status: inpatient/stepdown.   Reubin Milan MD [facility] Pager [phone].  If 7PM-7AM, please contact night-coverage [email or web address] Password [facility]  11/23/2016, 11:24 PM

## 2016-11-23 NOTE — ED Provider Notes (Signed)
[facility] DEPT Provider Note   CSN: [id number] Arrival date & time: 11/23/16  1514     History   Chief Complaint Chief Complaint  Patient presents with  . Back Pain  . Chest Pain    HPI Alexander [name] is a 76 y.o. male.  HPI   55 old male with past medical history as below who presents with diffuse body aches. Patient states that his pain started earlier today. He reports that he experienced a mild headache as well as neck pain. He then began to feel cramps and spasms throughout his entire body. He endorses spasms of his upper back and lower back. He has had associated cough with occasional sputum production. He denies any fevers. Family noticed that he was too weak to walk today so they presented to the ER. He denies any known sick contacts. He does endorse generalized pain that is 10 out of 10 in severity. Pain seems to come and go and cramp like spasms. No alleviating factors.  Past Medical History:  Diagnosis Date  . Anemia   . Aortic stenosis 06/15/12   TEE - EF 47-82%; grade 1 diastolic dysfunction; mild/mod aortic valve stenosis; Mitral valve had calcified annulus, mild pulm htn PA peak pressure 69mmHg  . Barrett's esophagus 05/2003  . Bradycardia 2017   [employer] 2240 Assurity dual-lead pacemaker  . Carpal tunnel syndrome, bilateral 11/03/2015  . Colon polyps   . CVA (cerebral infarction)    2004/affected left side  . Depression   . Diabetes mellitus without complication ([place])   . Diabetic peripheral neuropathy ([place]) 10/02/2015  . Diverticulosis   . Dyspnea    with exertion  . End stage renal disease ([place])    hemodialysis 3 times a week  . GERD (gastroesophageal reflux disease)   . Glaucoma   . Hyperlipidemia   . Hypertension   . Kidney stones   . Legally blind   . Macular degeneration    both eyes  . Orthostatic hypotension 09/09/2015  . Paroxysmal atrial fibrillation (H[facility])   . Peptic ulcer    bleeding, 1969  . Presence of permanent cardiac  pacemaker   . S/P epidural steroid injection    last  injection over 10 years ago  . Seasonal allergies   . Tubular adenoma of colon 07/2001    Patient Active Problem List   Diagnosis Date Noted  . Atrial fibrillation with RVR ([place]) 11/23/2016  . Glaucoma 11/23/2016  . GERD (gastroesophageal reflux disease) 11/23/2016  . Hypomagnesemia 11/23/2016  . Type 2 diabetes mellitus ([place]) 11/23/2016  . Depression 11/23/2016  . SIRS (systemic inflammatory response syndrome) ([place]) 11/23/2016  . S/P TAVR (transcatheter aortic valve replacement) 08/31/2016  . Long term (current) use of anticoagulants [Z79.01] 08/13/2016  . Persistent atrial fibrillation ([place])   . Carpal tunnel syndrome, bilateral 11/03/2015  . Hand pain 10/02/2015  . Diabetic peripheral neuropathy ([place]) 10/02/2015  . Orthostatic hypotension 09/09/2015  . Bradycardia 03/11/2015  . End stage renal disease ([place]) 01/28/2015  . Left arm pain 01/28/2015  . Long-term (current) use of anticoagulants 04/08/2014  . Acute bronchitis 04/05/2014  . Hyperlipidemia 04/04/2014  . ESRD (end stage renal disease) on dialysis ([place]) 03/13/2013  . PAD (peripheral artery disease) ([place]) 12/12/2012  . Chest pain 12/08/2012  . Severe aortic stenosis 06/15/2012  . Personal history of colonic polyps 02/03/2011  . Esophageal reflux 02/03/2011  . Anemia, unspecified 02/03/2011    Past Surgical History:  Procedure Laterality Date  . AV  FISTULA PLACEMENT  2009  . BACK SURGERY    . CARDIOVERSION N/A 11/13/2015   Procedure: CARDIOVERSION;  Surgeon: Troy Sine, MD;  Location: [facility] ENDOSCOPY;  Service: Cardiovascular;  Laterality: N/A;  . CARDIOVERSION N/A 01/13/2016   Procedure: CARDIOVERSION;  Surgeon: Will Meredith [name], MD;  Location: [place];  Service: Cardiovascular;  Laterality: N/A;  . [place]   right eye  . CYSTOSCOPY  several times   kidney stones  . EP IMPLANTABLE DEVICE N/A 03/11/2015   Procedure: Pacemaker  Implant;  Surgeon: Will Meredith [name], MD;  [place];  Laterality: Left  . LAMINECTOMY  1969  . RIGHT/LEFT HEART CATH AND CORONARY ANGIOGRAPHY N/A 08/20/2016   Procedure: Right/Left Heart Cath and Coronary Angiography;  Surgeon: Burnell Blanks, MD;  Location: [facility] CV LAB;  Service: Cardiovascular;  Laterality: N/A;  . TEE WITHOUT CARDIOVERSION N/A 07/22/2016   Procedure: TRANSESOPHAGEAL ECHOCARDIOGRAM (TEE);  Surgeon: Skeet Latch, MD;  Location: [place];  Service: Cardiovascular;  Laterality: N/A;  . TEE WITHOUT CARDIOVERSION N/A 08/31/2016   Procedure: TRANSESOPHAGEAL ECHOCARDIOGRAM (TEE);  Surgeon: Burnell Blanks, MD;  Location: [place];  Service: Open Heart Surgery;  Laterality: N/A;  . TONSILLECTOMY  1964  . TRANSCATHETER AORTIC VALVE REPLACEMENT, TRANSFEMORAL N/A 08/31/2016   Procedure: TRANSCATHETER AORTIC VALVE REPLACEMENT, TRANSFEMORAL;  Surgeon: Burnell Blanks, MD;  Location: [place];  Service: Open Heart Surgery;  Laterality: N/A;       Home Medications    Prior to Admission medications   Medication Sig Start Date End Date Taking? Authorizing Provider  acetaminophen (TYLENOL) 325 MG tablet Take 2 tablets (650 mg total) by mouth every 6 (six) hours as needed for mild pain. 09/02/16  Yes Lars Pinks M, PA-C  atorvastatin (LIPITOR) 40 MG tablet Take 20 mg by mouth daily.   Yes [provider]  b complex-vitamin c-folic acid (NEPHRO-VITE) 0.8 MG TABS Take 1 tablet by mouth daily.    Yes [provider]  bromocriptine (PARLODEL) 5 MG capsule Take 5 mg by mouth at bedtime.  11/30/10  Yes [provider]  calcium acetate (PHOSLO) 667 MG capsule Take 667 mg by mouth 4 (four) times daily.    Yes [provider]  cetirizine (ZYRTEC) 10 MG tablet Take 10 mg by mouth at bedtime.    Yes [provider]  cinacalcet (SENSIPAR) 30 MG tablet Take 30 mg by mouth every evening.    Yes [provider]  dorzolamide-timolol (COSOPT) 22.3-6.8 MG/ML ophthalmic solution Place 1 drop into the right eye 2 (two) times daily. 08/13/13  Yes [provider]  metoprolol tartrate (LOPRESSOR) 25 MG tablet Take 1 tablet (25 mg total) by mouth 2 (two) times daily. 10/29/16  Yes Skeet Latch, MD  midodrine (PROAMATINE) 5 MG tablet Take 15mg  (3 tablets) three times weekly as needed for low blood pressure after dialysis. 10/19/16  Yes Skeet Latch, MD  omeprazole (PRILOSEC) 20 MG capsule Take 20 mg by mouth daily.   Yes [provider]  predniSONE (DELTASONE) 5 MG tablet Take 5 mg by mouth daily.  04/27/16  Yes [provider]  sertraline (ZOLOFT) 50 MG tablet Take 50 mg by mouth daily. 11/18/16  Yes [provider]  warfarin (COUMADIN) 5 MG tablet Take 1-1.5 tablets by mouth daily as directed by coumadin clinic Patient taking differently: Take 5-7.5 mg by mouth See admin instructions. Take 1-1.5 tablets by mouth daily as directed by coumadin clinic  Current dose is 5 mg (1 tablet) daily. 03/08/16  Yes Skeet Latch, MD    Family History Family History  Problem Relation Age of Onset  . Stomach cancer Mother   . Hypertension Father        Died of heart attack  . Heart attack Father   . Stroke Sister   . Heart disease Sister   . Cancer Brother   . Colon cancer Neg Hx     Social History Social History  Substance Use Topics  . Smoking status: Former Smoker    Packs/day: 2.00    Years: 45.00    Types: Cigarettes    Quit date: 01/20/1998  . Smokeless tobacco: Never Used  . Alcohol use No     Allergies   Penicillins; Codeine; and Tramadol   Review of Systems Review of Systems  Constitutional: Positive for fatigue.  Respiratory: Positive for cough and shortness of breath.   Musculoskeletal: Positive for back pain.  Neurological: Positive for weakness.  All other systems reviewed and are negative.    Physical Exam Updated Vital  Signs BP 109/73   Pulse 94   Temp (!) 103.1 F (39.5 C) (Rectal) Comment: MD and RN notified.  Resp (!) 23   Ht 5\' 10"  (1.778 m)   Wt 88.5 kg (195 lb)   SpO2 91%   BMI 27.98 kg/m   Physical Exam  Constitutional: He is oriented to person, place, and time. He appears well-developed and well-nourished. He appears distressed.  HENT:  Head: Normocephalic and atraumatic.  Mildly dry mucous membranes  Eyes: Conjunctivae are normal.  Neck: Neck supple.  Bilateral paraspinal tenderness with palpable spasm.  Cardiovascular: Normal heart sounds.  An irregularly irregular rhythm present. Tachycardia present.  Exam reveals no friction rub.   No murmur heard. Pulmonary/Chest: Effort normal and breath sounds normal. No respiratory distress. He has no wheezes.  Abdominal: Soft. He exhibits no distension.  Musculoskeletal: He exhibits no edema.  Neurological: He is alert and oriented to person, place, and time. He exhibits normal muscle tone.  Skin: Skin is warm. Capillary refill takes less than 2 seconds.  Psychiatric: He has a normal mood and affect.  Nursing note and vitals reviewed.    ED Treatments / Results  Labs (all labs ordered are listed, but only abnormal results are displayed) Labs Reviewed  BASIC METABOLIC PANEL - Abnormal; Notable for the following:       Result Value   Chloride 95 (*)    Glucose, Bld 117 (*)    BUN 40 (*)    Creatinine, Ser 7.34 (*)    Calcium 8.4 (*)    GFR calc non Af Amer 6 (*)    GFR calc Af Amer 7 (*)    Anion gap 16 (*)    All other components within normal limits  CBC - Abnormal; Notable for the following:    WBC 13.5 (*)    RBC 3.59 (*)    Hemoglobin 11.1 (*)    HCT 35.4 (*)    RDW 17.5 (*)    Platelets 123 (*)    All other components within normal limits  HEPATIC FUNCTION PANEL - Abnormal; Notable for the following:    Albumin 3.0 (*)    AST 55 (*)    All other components within normal limits  MAGNESIUM - Abnormal; Notable for the  following:    Magnesium 1.5 (*)    All other components within normal limits  TROPONIN I - Abnormal; Notable  for the following:    Troponin I 0.09 (*)    All other components within normal limits  PROTIME-INR - Abnormal; Notable for the following:    Prothrombin Time 31.5 (*)    All other components within normal limits  CK - Abnormal; Notable for the following:    Total CK 630 (*)    All other components within normal limits  CULTURE, BLOOD (ROUTINE X 2)  CULTURE, BLOOD (ROUTINE X 2)  URINE CULTURE  URINALYSIS, ROUTINE W REFLEX MICROSCOPIC  PROTIME-INR  CBC WITH DIFFERENTIAL/PLATELET  TROPONIN I  I-STAT TROPONIN, ED  I-STAT CG4 LACTIC ACID, ED  I-STAT CG4 LACTIC ACID, ED    EKG  EKG Interpretation  Date/Time:  Tuesday November 23 2016 15:43:33 EDT Ventricular Rate:  114 PR Interval:    QRS Duration: 126 QT Interval:  386 QTC Calculation: 532 R Axis:   -60 Text Interpretation:  Atrial fibrillation with rapid ventricular response Left axis deviation Non-specific intra-ventricular conduction block Abnormal ECG Since last EKG, atrial fibrillation has replaced paced rhythm Confirmed by Duffy Bruce [phone]) on 11/24/2016 3:14:57 AM       Radiology Dg Chest 2 View  Result Date: 11/23/2016 CLINICAL DATA:  76 year old male with chest pain, shortness of breath on exertion and painful back spasms for 2 days. EXAM: CHEST  2 VIEW COMPARISON:  11/11/2016 and earlier. FINDINGS: Upright AP and lateral views of the chest. Stable right chest dual lead cardiac pacemaker. Prior TAVR. Stable cardiac size and mediastinal contours. Lower lung volumes on the AP view. No pneumothorax, pleural effusion or confluent pulmonary opacity. Mild increased pulmonary vascularity but no overt edema. No acute osseous abnormality identified. Calcified aortic atherosclerosis. Negative visible bowel gas pattern. IMPRESSION: 1. Increased pulmonary vascular congestion without overt pulmonary edema. 2. No other No  acute cardiopulmonary abnormality. 3.  Aortic Atherosclerosis (ICD10-I70.0). Electronically Signed   By: Genevie Ann M.D.   On: 11/23/2016 16:11   Ct Head Wo Contrast  Result Date: 11/23/2016 CLINICAL DATA:  Headache and intermittent chest pain. EXAM: CT HEAD WITHOUT CONTRAST CT CERVICAL SPINE WITHOUT CONTRAST TECHNIQUE: Multidetector CT imaging of the head and cervical spine was performed following the standard protocol without intravenous contrast. Multiplanar CT image reconstructions of the cervical spine were also generated. COMPARISON:  09/27/2016 head CT and 10/05/2015 CT cervical spine FINDINGS: CT HEAD FINDINGS Brain: Chronic left parietooccipital and inferior left temporal lobe infarcts with encephalomalacia. No acute intracranial hemorrhage, hydrocephalus, intra-axial mass nor extra-axial collections. No new large vascular territory infarction. Patchy small vessel ischemic disease of periventricular white matter likely reflects chronic small vessel ischemia. Vascular: No hyperdense vessel or unexpected calcification. Skull: Negative for fracture or focal lesion. Sinuses/Orbits: Ocular surgeries are noted bilaterally with scleral band on the right and lens replacement bilaterally. Other: None CT CERVICAL SPINE FINDINGS Alignment: Chronic anterolisthesis of C3 on C4, grade 1 up to 2.4 mm. Slight reversal cervical lordosis is again noted with degenerative disc disease from C4 through C7. Skull base and vertebrae: Negative for acute fracture. Osteoarthritis of the atlantooccipital and atlantodental intervals. Soft tissues and spinal canal: No prevertebral fluid or swelling. No visible canal hematoma. Disc levels: Mild disc space narrowing C2-3 and C3-4 with marked disc space narrowing C4 through C7. Small posterior marginal osteophytes and uncovertebral joint spurring is noted from C4 through C7. Multilevel degenerative facet arthropathy is seen as before. Upper chest: No acute pulmonary abnormality. Other:  Moderate bilateral extracranial carotid arteriosclerosis. IMPRESSION: 1. Chronic left parieto-occipital and inferior left temporal  lobe infarcts with encephalomalacia. 2. Chronic small vessel ischemic disease of periventricular white matter. No acute intracranial abnormality. 3. Cervical spondylosis, chronic in appearance without acute osseous abnormality. 4. Dense bilateral extracranial carotid arteriosclerosis, previously worked up with [place] in 2016. Electronically Signed   By: Ashley Royalty M.D.   On: 11/23/2016 22:45   Ct Chest Wo Contrast  Result Date: 11/23/2016 CLINICAL DATA:  Intermittent chest pain EXAM: CT CHEST WITHOUT CONTRAST TECHNIQUE: Multidetector CT imaging of the chest was performed following the standard protocol without IV contrast. COMPARISON:  Radiograph 11/23/2016, CT 08/24/2016 FINDINGS: Cardiovascular: Limited evaluation without intravenous contrast. Ectasia of the ascending segment, measuring up to 3.8 cm. Atherosclerotic vascular calcification. Interval aortic valve replacement. Coronary artery calcifications. Intracardiac pacing leads. No pericardial effusion. Mild cardiomegaly. Mediastinum/Nodes: Stable mildly prominent mediastinal lymph nodes with fatty hilus. Midline trachea. 8 mm hypodense nodule left lobe of thyroid. Esophagus within normal limits. Lungs/Pleura: Mild apical emphysema. No consolidation, pleural effusion or pneumothorax. Upper Abdomen: Partially visualized atrophic kidneys. Musculoskeletal: No acute or suspicious bone lesion. IMPRESSION: 1. Minimal apical emphysema. No acute pulmonary infiltrate, pleural effusion or pneumothorax 2. Interval aortic valve replacement.  Cardiomegaly. 3. Stable mildly prominent mediastinal lymph nodes. 4. Atrophic kidneys Aortic Atherosclerosis (ICD10-I70.0) and Emphysema (ICD10-J43.9). Electronically Signed   By: Donavan Foil M.D.   On: 11/23/2016 22:48   Ct Cervical Spine Wo Contrast  Result Date: 11/23/2016 CLINICAL DATA:  Neck  pain EXAM: CT CERVICAL SPINE WITHOUT CONTRAST TECHNIQUE: Multidetector CT imaging of the cervical spine was performed without intravenous contrast. Multiplanar CT image reconstructions were also generated. COMPARISON:  10/05/2015 CT FINDINGS: This report was also included as part of the same day head CT report but could not be merged. The contents of the report and impression on the same. Alignment: Chronic anterolisthesis of C3 on C4, grade 1 up to 2.4 mm. Slight reversal cervical lordosis is again noted with degenerative disc disease from C4 through C7. Skull base and vertebrae: Negative for acute fracture. Osteoarthritis of the atlantooccipital and atlantodental intervals. Soft tissues and spinal canal: No prevertebral fluid or swelling. No visible canal hematoma. Disc levels: Mild disc space narrowing C2-3 and C3-4 with marked disc space narrowing C4 through C7. Small posterior marginal osteophytes and uncovertebral joint spurring is noted from C4 through C7. Multilevel degenerative facet arthropathy is seen as Before. Upper chest: No acute pulmonary abnormality. Other: Moderate bilateral extracranial carotid arteriosclerosis. IMPRESSION: 1. Cervical spondylosis, chronic in appearance without acute osseous abnormality. 2. Dense bilateral extracranial carotid arteriosclerosis, previously worked up with [place] in 2016. Electronically Signed   By: Ashley Royalty M.D.   On: 11/23/2016 22:52    Procedures .Critical Care Performed by: Duffy Bruce Authorized by: Duffy Bruce   Critical care provider statement:    Critical care time (minutes):  35   Critical care time was exclusive of:  Separately billable procedures and treating other patients and teaching time   Critical care was necessary to treat or prevent imminent or life-threatening deterioration of the following conditions:  Circulatory failure, sepsis and cardiac failure   Critical care was time spent personally by me on the following activities:   Development of treatment plan with patient or surrogate, discussions with consultants, evaluation of patient's response to treatment, examination of patient, obtaining history from patient or surrogate, ordering and performing treatments and interventions, ordering and review of laboratory studies, ordering and review of radiographic studies, pulse oximetry, re-evaluation of patient's condition and review of old charts   I  assumed direction of critical care for this patient from another provider in my specialty: no     (including critical care time)  Medications Ordered in ED Medications  diltiazem (CARDIZEM) 1 mg/mL load via infusion 10 mg (10 mg Intravenous Bolus from Bag 11/23/16 2248)    And  diltiazem (CARDIZEM) 100 mg in dextrose 5 % 100 mL (1 mg/mL) infusion (7.5 mg/hr Intravenous Rate/Dose Change 11/24/16 0011)  sertraline (ZOLOFT) tablet 50 mg (not administered)  predniSONE (DELTASONE) tablet 5 mg (not administered)  pantoprazole (PROTONIX) EC tablet 40 mg (not administered)  metoprolol tartrate (LOPRESSOR) tablet 25 mg (not administered)  midodrine (PROAMATINE) tablet 15 mg (not administered)  dorzolamide-timolol (COSOPT) 22.3-6.8 MG/ML ophthalmic solution 1 drop (not administered)  cinacalcet (SENSIPAR) tablet 30 mg (not administered)  loratadine (CLARITIN) tablet 10 mg (not administered)  calcium acetate (PHOSLO) capsule 667 mg (not administered)  bromocriptine (PARLODEL) capsule 5 mg (not administered)  atorvastatin (LIPITOR) tablet 20 mg (not administered)  acetaminophen (TYLENOL) tablet 650 mg (not administered)  ondansetron (ZOFRAN) tablet 4 mg (not administered)    Or  ondansetron (ZOFRAN) injection 4 mg (not administered)  meropenem (MERREM) 500 mg in sodium chloride 0.9 % 50 mL IVPB (500 mg Intravenous New Bag/Given 11/24/16 0158)  diazepam (VALIUM) injection 2.5 mg (2.5 mg Intravenous Given 11/23/16 1929)  fentaNYL (SUBLIMAZE) injection 50 mcg (50 mcg Intravenous Given  11/23/16 2044)  sodium chloride 0.9 % bolus 500 mL (0 mLs Intravenous Stopped 11/23/16 2152)  acetaminophen (TYLENOL) tablet 1,000 mg (1,000 mg Oral Given 11/23/16 2044)  ceFEPIme (MAXIPIME) 2 g in dextrose 5 % 50 mL IVPB (0 g Intravenous Stopped 11/23/16 2152)  vancomycin (VANCOCIN) 2,000 mg in sodium chloride 0.9 % 500 mL IVPB (0 mg Intravenous Stopped 11/24/16 0127)  magnesium sulfate IVPB 2 g 50 mL (0 g Intravenous Stopped 11/24/16 0127)  dexamethasone (DECADRON) injection 10 mg (10 mg Intravenous Given 11/24/16 0009)     Initial Impression / Assessment and Plan / ED Course  I have reviewed the triage vital signs and the nursing notes.  Pertinent labs & imaging results that were available during my care of the patient were reviewed by me and considered in my medical decision making (see chart for details).     76 yo M here with fever, myalgias, and AFib RVR. Concern for sepsis of unclear etiology. He does have cough, sputum production but CXR and CT Chest are clear. His main complaint is neck pain and while this correlates with paraspinal pain on exam, must also consider meningitis given his risk factors. Unable to LP 2/2 coumadin use. Vanc/Cefepime and Decadron given. Otherwise, no apparent abnormality on CT Head/C-Spine. Lab work otherwise shows normal LA which is reassuring. Will need continued work-up.  Regarding his AFib RVR, suspect this is 2/2 sepsis, pain. Pain control given, small fluid bolus, then dilt bolus and gtt started with good effect. No ongoing CP to suggest ischemic etiology.  Final Clinical Impressions(s) / ED Diagnoses   Final diagnoses:  Sepsis, due to unspecified organism [facility])  Atrial fibrillation with rapid ventricular response [facility])    New Prescriptions New Prescriptions   No medications on file     Duffy Bruce, MD 11/24/16 [phone]

## 2016-11-23 NOTE — ED Triage Notes (Signed)
Pt started having back muscle spasm- pain spread to arms and legs. Denies any injury. Pt yelling in pain.

## 2016-11-23 NOTE — Telephone Encounter (Signed)
Mr. Alexander [name] presented to clinic today for follow up and was crying out in pain in the lobby with neck and chest pain.  He was advised to present to the ED for further evaluation and care.  Alexander Kistler C. Alexander Linsey, MD, [facility] 11/23/2016  3:16 PM

## 2016-11-23 NOTE — Progress Notes (Signed)
Pharmacy Antibiotic Note  Alexander [name] is a 76 y.o. male admitted on 11/23/2016 with sepsis.  Pharmacy has been consulted for Vancomycin and Cefepime dosing.  ESRD patient - SCr 7.34.   WBC 13.5. Tmax 103.1.  Note allergy to Penicillins of swelling. Patient tolerated course of Omnicef as outpatient - confirmed with family. Ok for Cefepime per Dr. Ellender Hose.   Plan: Vancomycin 2g IV x1.  Cefepime 2g IV x1.  Follow up HD plan for further dosing.   Height: 5\' 10"  (177.8 cm) Weight: 195 lb (88.5 kg) IBW/kg (Calculated) : 73  Temp (24hrs), Avg:100.9 F (38.3 C), Min:98.7 F (37.1 C), Max:103.1 F (39.5 C)   Recent Labs Lab 11/23/16 1546 11/23/16 1951  WBC 13.5*  --   CREATININE 7.34*  --   LATICACIDVEN  --  1.23    Estimated Creatinine Clearance: 9.6 mL/min (A) (by C-G formula based on SCr of 7.34 mg/dL (H)).    Allergies  Allergen Reactions  . Penicillins Swelling    SWELLING REACTION UNSPECIFIED   PATIENT HAD A PCN REACTION WITH IMMEDIATE RASH, FACIAL/TONGUE/THROAT SWELLING, SOB, OR LIGHTHEADEDNESS WITH HYPOTENSION:  #  #  #  YES  #  #  #   Has patient had a PCN reaction causing severe rash involving mucus membranes or skin necrosis: No Has patient had a PCN reaction that required hospitalization No Has patient had a PCN reaction occurring within the last 10 years: No If all of the above answers are "NO", then may proceed with Cephalosporin use.  . Codeine Nausea Only  . Tramadol Nausea Only    Antimicrobials this admission: 8/28 Vancomycin >> 8/28 Cefepime >>  Dose adjustments this admission:   Microbiology results: 8/28 BCx:  8/28 UCx:   Thank you for allowing pharmacy to be a part of this patient's care.  Sloan Leiter, PharmD, BCPS Clinical Pharmacist Clinical phone 11/23/2016 until [phone] After hours, please call #[phone] 11/23/2016 8:13 PM

## 2016-11-24 ENCOUNTER — Encounter: Payer: Self-pay | Admitting: *Deleted

## 2016-11-24 ENCOUNTER — Encounter (HOSPITAL_COMMUNITY): Payer: Self-pay

## 2016-11-24 ENCOUNTER — Other Ambulatory Visit: Payer: Self-pay | Admitting: *Deleted

## 2016-11-24 DIAGNOSIS — Z87891 Personal history of nicotine dependence: Secondary | ICD-10-CM

## 2016-11-24 DIAGNOSIS — R7881 Bacteremia: Secondary | ICD-10-CM | POA: Diagnosis present

## 2016-11-24 DIAGNOSIS — A419 Sepsis, unspecified organism: Secondary | ICD-10-CM

## 2016-11-24 DIAGNOSIS — R6510 Systemic inflammatory response syndrome (SIRS) of non-infectious origin without acute organ dysfunction: Secondary | ICD-10-CM

## 2016-11-24 DIAGNOSIS — D649 Anemia, unspecified: Secondary | ICD-10-CM

## 2016-11-24 DIAGNOSIS — K219 Gastro-esophageal reflux disease without esophagitis: Secondary | ICD-10-CM

## 2016-11-24 DIAGNOSIS — Z880 Allergy status to penicillin: Secondary | ICD-10-CM

## 2016-11-24 DIAGNOSIS — B955 Unspecified streptococcus as the cause of diseases classified elsewhere: Secondary | ICD-10-CM | POA: Diagnosis present

## 2016-11-24 DIAGNOSIS — Z950 Presence of cardiac pacemaker: Secondary | ICD-10-CM | POA: Diagnosis present

## 2016-11-24 LAB — RENAL FUNCTION PANEL
ANION GAP: 14 (ref 5–15)
ANION GAP: 15 (ref 5–15)
Albumin: 2.6 g/dL — ABNORMAL LOW (ref 3.5–5.0)
Albumin: 2.5 g/dL — ABNORMAL LOW (ref 3.5–5.0)
BUN: 50 mg/dL — AB (ref 6–20)
BUN: 68 mg/dL — ABNORMAL HIGH (ref 6–20)
CALCIUM: 7.7 mg/dL — AB (ref 8.9–10.3)
CO2: 24 mmol/L (ref 22–32)
CO2: 22 mmol/L (ref 22–32)
CREATININE: 8.95 mg/dL — AB (ref 0.61–1.24)
Calcium: 7.9 mg/dL — ABNORMAL LOW (ref 8.9–10.3)
Chloride: 98 mmol/L — ABNORMAL LOW (ref 101–111)
Chloride: 97 mmol/L — ABNORMAL LOW (ref 101–111)
Creatinine, Ser: 8.15 mg/dL — ABNORMAL HIGH (ref 0.61–1.24)
GFR calc Af Amer: 7 mL/min — ABNORMAL LOW (ref >60)
GFR calc non Af Amer: 5 mL/min — ABNORMAL LOW (ref >60)
GFR, EST AFRICAN AMERICAN: 6 mL/min — AB (ref >60)
GFR, EST NON AFRICAN AMERICAN: 6 mL/min — AB (ref >60)
Glucose, Bld: 174 mg/dL — ABNORMAL HIGH (ref 65–99)
Glucose, Bld: 170 mg/dL — ABNORMAL HIGH (ref 65–99)
PHOSPHORUS: 4.1 mg/dL (ref 2.5–4.6)
PHOSPHORUS: 4.4 mg/dL (ref 2.5–4.6)
POTASSIUM: 4.2 mmol/L (ref 3.5–5.1)
POTASSIUM: 4 mmol/L (ref 3.5–5.1)
Sodium: 136 mmol/L (ref 135–145)
Sodium: 134 mmol/L — ABNORMAL LOW (ref 135–145)

## 2016-11-24 LAB — CBC WITH DIFFERENTIAL/PLATELET
BASOS ABS: 0 10*3/uL (ref 0.0–0.1)
Basophils Relative: 0 %
Eosinophils Absolute: 0 10*3/uL (ref 0.0–0.7)
Eosinophils Relative: 0 %
HEMATOCRIT: 31.1 % — AB (ref 39.0–52.0)
HEMOGLOBIN: 10 g/dL — AB (ref 13.0–17.0)
LYMPHS PCT: 6 %
Lymphs Abs: 0.8 10*3/uL (ref 0.7–4.0)
MCH: 31.1 pg (ref 26.0–34.0)
MCHC: 32.2 g/dL (ref 30.0–36.0)
MCV: 96.6 fL (ref 78.0–100.0)
Monocytes Absolute: 0.8 10*3/uL (ref 0.1–1.0)
Monocytes Relative: 6 %
NEUTROS ABS: 11.5 10*3/uL — AB (ref 1.7–7.7)
NEUTROS PCT: 88 %
PLATELETS: 103 10*3/uL — AB (ref 150–400)
RBC: 3.22 MIL/uL — AB (ref 4.22–5.81)
RDW: 17.7 % — ABNORMAL HIGH (ref 11.5–15.5)
WBC: 13.1 10*3/uL — AB (ref 4.0–10.5)

## 2016-11-24 LAB — PROTIME-INR
INR: 3.72
Prothrombin Time: 36.6 seconds — ABNORMAL HIGH (ref 11.4–15.2)

## 2016-11-24 LAB — CBC
HEMATOCRIT: 29.9 % — AB (ref 39.0–52.0)
HEMOGLOBIN: 9.7 g/dL — AB (ref 13.0–17.0)
MCH: 30.9 pg (ref 26.0–34.0)
MCHC: 32.4 g/dL (ref 30.0–36.0)
MCV: 95.2 fL (ref 78.0–100.0)
Platelets: 124 10*3/uL — ABNORMAL LOW (ref 150–400)
RBC: 3.14 MIL/uL — ABNORMAL LOW (ref 4.22–5.81)
RDW: 17.4 % — ABNORMAL HIGH (ref 11.5–15.5)
WBC: 13.6 10*3/uL — ABNORMAL HIGH (ref 4.0–10.5)

## 2016-11-24 LAB — GLUCOSE, CAPILLARY
GLUCOSE-CAPILLARY: 196 mg/dL — AB (ref 65–99)
Glucose-Capillary: 166 mg/dL — ABNORMAL HIGH (ref 65–99)
Glucose-Capillary: 193 mg/dL — ABNORMAL HIGH (ref 65–99)
Glucose-Capillary: 164 mg/dL — ABNORMAL HIGH (ref 65–99)

## 2016-11-24 LAB — BLOOD CULTURE ID PANEL (REFLEXED)
Acinetobacter baumannii: NOT DETECTED
CANDIDA GLABRATA: NOT DETECTED
CANDIDA KRUSEI: NOT DETECTED
CANDIDA TROPICALIS: NOT DETECTED
Candida albicans: NOT DETECTED
Candida parapsilosis: NOT DETECTED
ENTEROBACTER CLOACAE COMPLEX: NOT DETECTED
Enterobacteriaceae species: NOT DETECTED
Enterococcus species: NOT DETECTED
Escherichia coli: NOT DETECTED
Haemophilus influenzae: NOT DETECTED
KLEBSIELLA PNEUMONIAE: NOT DETECTED
Klebsiella oxytoca: NOT DETECTED
Listeria monocytogenes: NOT DETECTED
NEISSERIA MENINGITIDIS: NOT DETECTED
PROTEUS SPECIES: NOT DETECTED
Pseudomonas aeruginosa: NOT DETECTED
STAPHYLOCOCCUS SPECIES: NOT DETECTED
Serratia marcescens: NOT DETECTED
Staphylococcus aureus (BCID): NOT DETECTED
Streptococcus agalactiae: NOT DETECTED
Streptococcus pneumoniae: NOT DETECTED
Streptococcus pyogenes: NOT DETECTED
Streptococcus species: DETECTED — AB

## 2016-11-24 LAB — TROPONIN I
TROPONIN I: 0.07 ng/mL — AB (ref <0.03)
Troponin I: 0.07 ng/mL (ref <0.03)

## 2016-11-24 LAB — MRSA PCR SCREENING: MRSA by PCR: NEGATIVE

## 2016-11-24 MED ORDER — LORATADINE 10 MG PO TABS
10.0000 mg | ORAL_TABLET | Freq: Every day | ORAL | Status: DC
  Administered 2016-11-24 – 2016-12-02 (×9): 10 mg via ORAL
  Filled 2016-11-24 (×9): qty 1

## 2016-11-24 MED ORDER — MIDODRINE HCL 5 MG PO TABS
ORAL_TABLET | ORAL | Status: AC
  Administered 2016-11-24: 15 mg via ORAL
  Filled 2016-11-24: qty 3

## 2016-11-24 MED ORDER — METOPROLOL TARTRATE 25 MG PO TABS
25.0000 mg | ORAL_TABLET | Freq: Two times a day (BID) | ORAL | Status: DC
  Administered 2016-11-24 – 2016-11-27 (×7): 25 mg via ORAL
  Filled 2016-11-24 (×7): qty 1

## 2016-11-24 MED ORDER — BROMOCRIPTINE MESYLATE 2.5 MG PO TABS
5.0000 mg | ORAL_TABLET | Freq: Every day | ORAL | Status: DC
  Administered 2016-11-25 – 2016-12-01 (×8): 5 mg via ORAL
  Filled 2016-11-24 (×9): qty 2

## 2016-11-24 MED ORDER — SODIUM CHLORIDE 0.9 % IV SOLN
500.0000 mg | INTRAVENOUS | Status: DC
  Administered 2016-11-24: 500 mg via INTRAVENOUS
  Filled 2016-11-24: qty 0.5

## 2016-11-24 MED ORDER — CINACALCET HCL 30 MG PO TABS
30.0000 mg | ORAL_TABLET | Freq: Every evening | ORAL | Status: DC
  Administered 2016-11-24 – 2016-12-02 (×9): 30 mg via ORAL
  Filled 2016-11-24 (×9): qty 1

## 2016-11-24 MED ORDER — SODIUM CHLORIDE 0.9 % IV SOLN
500.0000 mg | Freq: Once | INTRAVENOUS | Status: AC
  Administered 2016-11-24: 500 mg via INTRAVENOUS
  Filled 2016-11-24: qty 0.5

## 2016-11-24 MED ORDER — ATORVASTATIN CALCIUM 20 MG PO TABS
20.0000 mg | ORAL_TABLET | Freq: Every day | ORAL | Status: DC
  Administered 2016-11-24 – 2016-12-02 (×9): 20 mg via ORAL
  Filled 2016-11-24 (×9): qty 1

## 2016-11-24 MED ORDER — ONDANSETRON HCL 4 MG/2ML IJ SOLN: 4.0000 mg | Freq: Four times a day (QID) | INTRAMUSCULAR | Status: DC | PRN

## 2016-11-24 MED ORDER — ONDANSETRON HCL 4 MG PO TABS: 4.0000 mg | ORAL_TABLET | Freq: Four times a day (QID) | ORAL | Status: DC | PRN

## 2016-11-24 MED ORDER — HEPARIN SODIUM (PORCINE) 5000 UNIT/ML IJ SOLN: 5000.0000 [IU] | Freq: Three times a day (TID) | INTRAMUSCULAR | Status: DC

## 2016-11-24 MED ORDER — CALCIUM ACETATE (PHOS BINDER) 667 MG PO CAPS
667.0000 mg | ORAL_CAPSULE | Freq: Three times a day (TID) | ORAL | Status: DC
  Administered 2016-11-24 – 2016-12-02 (×24): 667 mg via ORAL
  Filled 2016-11-24 (×26): qty 1

## 2016-11-24 MED ORDER — POLYETHYLENE GLYCOL 3350 17 G PO PACK
17.0000 g | PACK | Freq: Every day | ORAL | Status: DC
  Administered 2016-11-24 – 2016-12-02 (×8): 17 g via ORAL
  Filled 2016-11-24 (×9): qty 1

## 2016-11-24 MED ORDER — VANCOMYCIN HCL IN DEXTROSE 1-5 GM/200ML-% IV SOLN
1000.0000 mg | INTRAVENOUS | Status: DC
  Administered 2016-11-25: 1000 mg via INTRAVENOUS
  Filled 2016-11-24 (×2): qty 200

## 2016-11-24 MED ORDER — MORPHINE SULFATE (PF) 2 MG/ML IV SOLN
1.0000 mg | INTRAVENOUS | Status: DC | PRN
  Administered 2016-11-26 – 2016-12-02 (×4): 2 mg via INTRAVENOUS
  Filled 2016-11-24 (×4): qty 1

## 2016-11-24 MED ORDER — SODIUM CHLORIDE 0.9 % IV SOLN: 1.0000 g | INTRAVENOUS | Status: DC

## 2016-11-24 MED ORDER — GENTAMICIN SULFATE 40 MG/ML IJ SOLN
70.0000 mg | INTRAVENOUS | Status: DC
  Administered 2016-11-26 – 2016-11-29 (×2): 70 mg via INTRAVENOUS
  Filled 2016-11-24 (×3): qty 1.75

## 2016-11-24 MED ORDER — FLUMAZENIL 0.5 MG/5ML IV SOLN
0.2000 mg | Freq: Once | INTRAVENOUS | Status: AC
  Administered 2016-11-24: 0.2 mg via INTRAVENOUS

## 2016-11-24 MED ORDER — ACETAMINOPHEN 325 MG PO TABS
650.0000 mg | ORAL_TABLET | Freq: Four times a day (QID) | ORAL | Status: DC | PRN
  Administered 2016-11-24 – 2016-11-26 (×6): 650 mg via ORAL
  Filled 2016-11-24 (×7): qty 2

## 2016-11-24 MED ORDER — DEXTROSE 5 % IV SOLN
150.0000 mg | Freq: Once | INTRAVENOUS | Status: AC
  Administered 2016-11-25: 150 mg via INTRAVENOUS
  Filled 2016-11-24: qty 3.75

## 2016-11-24 MED ORDER — SENNOSIDES-DOCUSATE SODIUM 8.6-50 MG PO TABS
1.0000 | ORAL_TABLET | Freq: Two times a day (BID) | ORAL | Status: DC
  Administered 2016-11-24 – 2016-11-29 (×10): 1 via ORAL
  Filled 2016-11-24 (×10): qty 1

## 2016-11-24 MED ORDER — DORZOLAMIDE HCL-TIMOLOL MAL 2-0.5 % OP SOLN
1.0000 [drp] | Freq: Two times a day (BID) | OPHTHALMIC | Status: DC
  Administered 2016-11-24 – 2016-11-25 (×3): 1 [drp] via OPHTHALMIC
  Filled 2016-11-24: qty 10

## 2016-11-24 MED ORDER — PREDNISONE 10 MG PO TABS
5.0000 mg | ORAL_TABLET | Freq: Every day | ORAL | Status: DC
  Administered 2016-11-24 – 2016-12-02 (×9): 5 mg via ORAL
  Filled 2016-11-24 (×9): qty 1

## 2016-11-24 MED ORDER — MIDODRINE HCL 5 MG PO TABS
15.0000 mg | ORAL_TABLET | ORAL | Status: DC
  Administered 2016-11-24 – 2016-12-01 (×5): 15 mg via ORAL
  Filled 2016-11-24 (×2): qty 3

## 2016-11-24 MED ORDER — SERTRALINE HCL 50 MG PO TABS
50.0000 mg | ORAL_TABLET | Freq: Every day | ORAL | Status: DC
  Administered 2016-11-25 – 2016-12-02 (×8): 50 mg via ORAL
  Filled 2016-11-24 (×8): qty 1

## 2016-11-24 MED ORDER — PANTOPRAZOLE SODIUM 40 MG PO TBEC
40.0000 mg | DELAYED_RELEASE_TABLET | Freq: Every day | ORAL | Status: DC
  Administered 2016-11-24 – 2016-12-02 (×9): 40 mg via ORAL
  Filled 2016-11-24 (×10): qty 1

## 2016-11-24 MED ORDER — DIAZEPAM 5 MG/ML IJ SOLN
2.5000 mg | Freq: Three times a day (TID) | INTRAMUSCULAR | Status: DC | PRN
  Administered 2016-11-24: 2.5 mg via INTRAVENOUS
  Filled 2016-11-24 (×2): qty 2

## 2016-11-24 MED ORDER — FLUMAZENIL 0.5 MG/5ML IV SOLN
INTRAVENOUS | Status: AC
  Filled 2016-11-24: qty 5

## 2016-11-24 NOTE — Consult Note (Signed)
[facility] Renal Consultation Note  Indication for Consultation:  Management of ESRD/hemodialysis; anemia, hypertension/volume and secondary hyperparathyroidism  HPI: Alexander [name] is a 76 y.o. male with ESRD (HD MWF  [facility],Complaint )ho DM type 2 ,paroxysmal atrial fibrillation, history of bradycardia (S/Pdual lead pacemaker placement), CVA,  macular degeneration/legally blind, depression,  admitted with  Febrile illness (103.1) and A. Fib with  RVR.   Pt and family ( daughter)gave history of severe Head Ache/ chills and  progressively worsening  back pain, chest pain, left shoulder pain, muscle spasms of all extremities since Monday after he finished hemodialysis. No problems using his AVF was afebrile at op [facility] center 11/22/16. / no pain or discharge  reported from AVF .  Noted  CXR , CT chest, CT head and CT neck did not show any significant acute pathology  WBC 13.1.    Currently [employer] , daughter in room/  headache continues .For HD today .     Past Medical History:  Diagnosis Date  . Anemia   . Aortic stenosis 06/15/12   TEE - EF 56-70%; grade 1 diastolic dysfunction; mild/mod aortic valve stenosis; Mitral valve had calcified annulus, mild pulm htn PA peak pressure 43mHg  . Barrett's esophagus 05/2003  . Bradycardia 2017   [employer] 2240 Assurity dual-lead pacemaker  . Carpal tunnel syndrome, bilateral 11/03/2015  . Colon polyps   . CVA (cerebral infarction)    2004/affected left side  . Depression   . Diabetes mellitus without complication ([place]   . Diabetic peripheral neuropathy ([place] 10/02/2015  . Diverticulosis   . Dyspnea    with exertion  . End stage renal disease ([place]    hemodialysis 3 times a week  . GERD (gastroesophageal reflux disease)   . Glaucoma   . Hyperlipidemia   . Hypertension   . Kidney stones   . Legally blind   . Macular degeneration    both eyes  . Orthostatic hypotension 09/09/2015  . Paroxysmal atrial fibrillation  (HCC)   . Peptic ulcer    bleeding, 1969  . Presence of permanent cardiac pacemaker   . S/P epidural steroid injection    last  injection over 10 years ago  . Seasonal allergies   . Tubular adenoma of colon 07/2001    Past Surgical History:  Procedure Laterality Date  . AV FISTULA PLACEMENT  2009  . BACK SURGERY    . CARDIOVERSION N/A 11/13/2015   Procedure: CARDIOVERSION;  Surgeon: TTroy Sine MD;  [facility];  Service: Cardiovascular;  Laterality: N/A;  . CARDIOVERSION N/A 01/13/2016   Procedure: CARDIOVERSION;  Surgeon: Will MMeredith [name] MD;  [place] [facility]  Service: Cardiovascular;  Laterality: N/A;  . [place]  [place]  . CYSTOSCOPY  several times   kidney stones  . EP IMPLANTABLE DEVICE N/A 03/11/2015   Procedure: Pacemaker Implant;  Surgeon: Will MMeredith [name] MD;  [place]  Laterality: Left  . LAMINECTOMY  1969  . RIGHT/LEFT HEART CATH AND CORONARY ANGIOGRAPHY N/A 08/20/2016   Procedure: Right/Left Heart Cath and Coronary Angiography;  Surgeon: MBurnell Blanks MD;  [facility];  Service: Cardiovascular;  Laterality: N/A;  . TEE WITHOUT CARDIOVERSION N/A 07/22/2016   Procedure: TRANSESOPHAGEAL ECHOCARDIOGRAM (TEE);  Surgeon: TSkeet Latch MD;  [place] [facility]  Service: Cardiovascular;  Laterality: N/A;  . TEE WITHOUT CARDIOVERSION N/A 08/31/2016   Procedure: TRANSESOPHAGEAL ECHOCARDIOGRAM (TEE);  Surgeon: MAngelena Form  Annita Brod, MD;  Location: [place];  Service: Open Heart Surgery;  Laterality: N/A;  . TONSILLECTOMY  1964  . TRANSCATHETER AORTIC VALVE REPLACEMENT, TRANSFEMORAL N/A 08/31/2016   Procedure: TRANSCATHETER AORTIC VALVE REPLACEMENT, TRANSFEMORAL;  Surgeon: Burnell Blanks, MD;  Location: [place];  Service: Open Heart Surgery;  Laterality: N/A;      Family History  Problem Relation Age of Onset  . Stomach cancer Mother   . Hypertension Father        Died  of heart attack  . Heart attack Father   . Stroke Sister   . Heart disease Sister   . Cancer Brother   . Colon cancer Neg Hx       reports that he quit smoking about 18 years ago. His smoking use included Cigarettes. He has a 90.00 pack-year smoking history. He has never used smokeless tobacco. He reports that he does not drink alcohol or use drugs.   Allergies  Allergen Reactions  . Penicillins Swelling    SWELLING REACTION UNSPECIFIED   PATIENT HAD A PCN REACTION WITH IMMEDIATE RASH, FACIAL/TONGUE/THROAT SWELLING, SOB, OR LIGHTHEADEDNESS WITH HYPOTENSION:  #  #  #  YES  #  #  #   Has patient had a PCN reaction causing severe rash involving mucus membranes or skin necrosis: No Has patient had a PCN reaction that required hospitalization No Has patient had a PCN reaction occurring within the last 10 years: No If all of the above answers are "NO", then may proceed with Cephalosporin use.  . Codeine Nausea Only  . Tramadol Nausea Only    Prior to Admission medications   Medication Sig Start Date End Date Taking? Authorizing Provider  acetaminophen (TYLENOL) 325 MG tablet Take 2 tablets (650 mg total) by mouth every 6 (six) hours as needed for mild pain. 09/02/16  Yes Lars Pinks M, PA-C  atorvastatin (LIPITOR) 40 MG tablet Take 20 mg by mouth daily.   Yes [provider]  b complex-vitamin c-folic acid (NEPHRO-VITE) 0.8 MG TABS Take 1 tablet by mouth daily.    Yes [provider]  bromocriptine (PARLODEL) 5 MG capsule Take 5 mg by mouth at bedtime.  11/30/10  Yes [provider]  calcium acetate (PHOSLO) 667 MG capsule Take 667 mg by mouth 4 (four) times daily.    Yes [provider]  cetirizine (ZYRTEC) 10 MG tablet Take 10 mg by mouth at bedtime.    Yes [provider]  cinacalcet (SENSIPAR) 30 MG tablet Take 30 mg by mouth every evening.    Yes [provider]  dorzolamide-timolol (COSOPT) 22.3-6.8 MG/ML ophthalmic solution  Place 1 drop into the right eye 2 (two) times daily. 08/13/13  Yes [provider]  metoprolol tartrate (LOPRESSOR) 25 MG tablet Take 1 tablet (25 mg total) by mouth 2 (two) times daily. 10/29/16  Yes Skeet Latch, MD  midodrine (PROAMATINE) 5 MG tablet Take 84m (3 tablets) three times weekly as needed for low blood pressure after dialysis. 10/19/16  Yes RSkeet Latch MD  omeprazole (PRILOSEC) 20 MG capsule Take 20 mg by mouth daily.   Yes [provider]  predniSONE (DELTASONE) 5 MG tablet Take 5 mg by mouth daily.  04/27/16  Yes [provider]  sertraline (ZOLOFT) 50 MG tablet Take 50 mg by mouth daily. 11/18/16  Yes [provider]  warfarin (COUMADIN) 5 MG tablet Take 1-1.5 tablets by mouth daily as directed by coumadin clinic Patient taking differently: Take 5-7.5 mg  by mouth See admin instructions. Take 1-1.5 tablets by mouth daily as directed by coumadin clinic  Current dose is 5 mg (1 tablet) daily. 03/08/16  Yes Skeet Latch, MD     Anti-infectives    Start     Dose/Rate Route Frequency Ordered Stop   11/25/16 1800  meropenem (MERREM) 1 g in sodium chloride 0.9 % 100 mL IVPB     1 g 200 mL/hr over 30 Minutes Intravenous Every 24 hours 11/24/16 0832     11/24/16 1000  meropenem (MERREM) 500 mg in sodium chloride 0.9 % 50 mL IVPB     500 mg 100 mL/hr over 30 Minutes Intravenous  Once 11/24/16 0832 11/24/16 1001   11/24/16 0130  meropenem (MERREM) 500 mg in sodium chloride 0.9 % 50 mL IVPB  Status:  Discontinued    Comments:  May discontinue cefepime once meropenem started.   500 mg 100 mL/hr over 30 Minutes Intravenous Every 24 hours 11/24/16 0115 11/24/16 0832   11/23/16 2030  ceFEPIme (MAXIPIME) 2 g in dextrose 5 % 50 mL IVPB     2 g 100 mL/hr over 30 Minutes Intravenous  Once 11/23/16 2024 11/24/16 0331   11/23/16 2030  vancomycin (VANCOCIN) 2,000 mg in sodium chloride 0.9 % 500 mL IVPB     2,000 mg 250 mL/hr over 120 Minutes  Intravenous  Once 11/23/16 2024 11/24/16 0331   11/23/16 2000  levofloxacin (LEVAQUIN) IVPB 750 mg  Status:  Discontinued     750 mg 100 mL/hr over 90 Minutes Intravenous  Once 11/23/16 1959 11/23/16 2024   11/23/16 2000  aztreonam (AZACTAM) 2 g in dextrose 5 % 50 mL IVPB  Status:  Discontinued     2 g 100 mL/hr over 30 Minutes Intravenous  Once 11/23/16 1959 11/23/16 2024   11/23/16 2000  vancomycin (VANCOCIN) IVPB 1000 mg/200 mL premix  Status:  Discontinued     1,000 mg 200 mL/hr over 60 Minutes Intravenous  Once 11/23/16 1959 11/23/16 2024      Results for orders placed or performed during the hospital encounter of 11/23/16 (from the past 48 hour(s))  Basic metabolic panel     Status: Abnormal   Collection Time: 11/23/16  3:46 PM  Result Value Ref Range   Sodium 136 135 - 145 mmol/L   Potassium 4.0 3.5 - 5.1 mmol/L   Chloride 95 (L) 101 - 111 mmol/L   CO2 25 22 - 32 mmol/L   Glucose, Bld 117 (H) 65 - 99 mg/dL   BUN 40 (H) 6 - 20 mg/dL   Creatinine, Ser 7.34 (H) 0.61 - 1.24 mg/dL   Calcium 8.4 (L) 8.9 - 10.3 mg/dL   GFR calc non Af Amer 6 (L) >60 mL/min   GFR calc Af Amer 7 (L) >60 mL/min    Comment: (NOTE) The eGFR has been calculated using the CKD EPI equation. This calculation has not been validated in all clinical situations. eGFR's persistently <60 mL/min signify possible Chronic Kidney Disease.    Anion gap 16 (H) 5 - 15  CBC     Status: Abnormal   Collection Time: 11/23/16  3:46 PM  Result Value Ref Range   WBC 13.5 (H) 4.0 - 10.5 K/uL   RBC 3.59 (L) 4.22 - 5.81 MIL/uL   Hemoglobin 11.1 (L) 13.0 - 17.0 g/dL   HCT 35.4 (L) 39.0 - 52.0 %   MCV 98.6 78.0 - 100.0 fL   MCH 30.9 26.0 - 34.0 pg   MCHC  31.4 30.0 - 36.0 g/dL   RDW 17.5 (H) 11.5 - 15.5 %   Platelets 123 (L) 150 - 400 K/uL  I-stat troponin, ED     Status: None   Collection Time: 11/23/16  4:01 PM  Result Value Ref Range   Troponin i, poc 0.07 0.00 - 0.08 ng/mL   Comment 3            Comment: Due to  the release kinetics of cTnI, a negative result within the first hours of the onset of symptoms does not rule out myocardial infarction with certainty. If myocardial infarction is still suspected, repeat the test at appropriate intervals.   Hepatic function panel     Status: Abnormal   Collection Time: 11/23/16  7:36 PM  Result Value Ref Range   Total Protein 6.9 6.5 - 8.1 g/dL   Albumin 3.0 (L) 3.5 - 5.0 g/dL   AST 55 (H) 15 - 41 U/L   ALT 37 17 - 63 U/L   Alkaline Phosphatase 70 38 - 126 U/L   Total Bilirubin 1.2 0.3 - 1.2 mg/dL   Bilirubin, Direct 0.3 0.1 - 0.5 mg/dL   Indirect Bilirubin 0.9 0.3 - 0.9 mg/dL  Magnesium     Status: Abnormal   Collection Time: 11/23/16  7:36 PM  Result Value Ref Range   Magnesium 1.5 (L) 1.7 - 2.4 mg/dL  Troponin I     Status: Abnormal   Collection Time: 11/23/16  7:36 PM  Result Value Ref Range   Troponin I 0.09 (HH) <0.03 ng/mL    Comment: REPEATED TO VERIFY CRITICAL RESULT CALLED TO, READ BACK BY AND VERIFIED WITH: A GUIGOZA,RN 2132 11/23/2016 WBOND   Protime-INR     Status: Abnormal   Collection Time: 11/23/16  7:36 PM  Result Value Ref Range   Prothrombin Time 31.5 (H) 11.4 - 15.2 seconds   INR 3.08   CK     Status: Abnormal   Collection Time: 11/23/16  7:36 PM  Result Value Ref Range   Total CK 630 (H) 49 - 397 U/L  I-Stat CG4 Lactic Acid, ED     Status: None   Collection Time: 11/23/16  7:51 PM  Result Value Ref Range   Lactic Acid, Venous 1.23 0.5 - 1.9 mmol/L  Blood Culture (routine x 2)     Status: None (Preliminary result)   Collection Time: 11/23/16  8:15 PM  Result Value Ref Range   Specimen Description BLOOD RIGHT ANTECUBITAL    Special Requests      BOTTLES DRAWN AEROBIC AND ANAEROBIC Blood Culture adequate volume   Culture  Setup Time      GRAM POSITIVE COCCI IN PAIRS AND CHAINS ANAEROBIC BOTTLE ONLY IDENTIFICATION TO FOLLOW    Culture GRAM POSITIVE COCCI IN PAIRS AND CHAINS    Report Status PENDING   Blood  Culture (routine x 2)     Status: None (Preliminary result)   Collection Time: 11/23/16  8:22 PM  Result Value Ref Range   Specimen Description BLOOD RIGHT WRIST    Special Requests      BOTTLES DRAWN AEROBIC AND ANAEROBIC Blood Culture adequate volume   Culture  Setup Time      GRAM POSITIVE COCCI IN PAIRS AND CHAINS ANAEROBIC BOTTLE ONLY Organism ID to follow    Culture GRAM POSITIVE COCCI IN PAIRS AND CHAINS    Report Status PENDING   Protime-INR     Status: Abnormal   Collection Time: 11/24/16  3:12 AM  Result Value Ref Range   Prothrombin Time 36.6 (H) 11.4 - 15.2 seconds   INR 3.72   CBC WITH DIFFERENTIAL     Status: Abnormal   Collection Time: 11/24/16  3:12 AM  Result Value Ref Range   WBC 13.1 (H) 4.0 - 10.5 K/uL   RBC 3.22 (L) 4.22 - 5.81 MIL/uL   Hemoglobin 10.0 (L) 13.0 - 17.0 g/dL   HCT 31.1 (L) 39.0 - 52.0 %   MCV 96.6 78.0 - 100.0 fL   MCH 31.1 26.0 - 34.0 pg   MCHC 32.2 30.0 - 36.0 g/dL   RDW 17.7 (H) 11.5 - 15.5 %   Platelets 103 (L) 150 - 400 K/uL    Comment: PLATELET COUNT CONFIRMED BY SMEAR   Neutrophils Relative % 88 %   Neutro Abs 11.5 (H) 1.7 - 7.7 K/uL   Lymphocytes Relative 6 %   Lymphs Abs 0.8 0.7 - 4.0 K/uL   Monocytes Relative 6 %   Monocytes Absolute 0.8 0.1 - 1.0 K/uL   Eosinophils Relative 0 %   Eosinophils Absolute 0.0 0.0 - 0.7 K/uL   Basophils Relative 0 %   Basophils Absolute 0.0 0.0 - 0.1 K/uL  Troponin I-serum (one time only)     Status: Abnormal   Collection Time: 11/24/16  3:12 AM  Result Value Ref Range   Troponin I 0.07 (HH) <0.03 ng/mL    Comment: CRITICAL VALUE NOTED.  VALUE IS CONSISTENT WITH PREVIOUSLY REPORTED AND CALLED VALUE.  MRSA PCR Screening     Status: None   Collection Time: 11/24/16  3:25 AM  Result Value Ref Range   MRSA by PCR NEGATIVE NEGATIVE    Comment:        The GeneXpert MRSA Assay (FDA approved for NASAL specimens only), is one component of a comprehensive MRSA colonization surveillance program. It  is not intended to diagnose MRSA infection nor to guide or monitor treatment for MRSA infections.   Renal function panel     Status: Abnormal   Collection Time: 11/24/16  6:04 AM  Result Value Ref Range   Sodium 136 135 - 145 mmol/L   Potassium 4.2 3.5 - 5.1 mmol/L   Chloride 98 (L) 101 - 111 mmol/L   CO2 24 22 - 32 mmol/L   Glucose, Bld 174 (H) 65 - 99 mg/dL   BUN 50 (H) 6 - 20 mg/dL   Creatinine, Ser 8.15 (H) 0.61 - 1.24 mg/dL   Calcium 7.9 (L) 8.9 - 10.3 mg/dL   Phosphorus 4.1 2.5 - 4.6 mg/dL   Albumin 2.6 (L) 3.5 - 5.0 g/dL   GFR calc non Af Amer 6 (L) >60 mL/min   GFR calc Af Amer 7 (L) >60 mL/min    Comment: (NOTE) The eGFR has been calculated using the CKD EPI equation. This calculation has not been validated in all clinical situations. eGFR's persistently <60 mL/min signify possible Chronic Kidney Disease.    Anion gap 14 5 - 15  Troponin I     Status: Abnormal   Collection Time: 11/24/16  6:04 AM  Result Value Ref Range   Troponin I 0.07 (HH) <0.03 ng/mL    Comment: CRITICAL VALUE NOTED.  VALUE IS CONSISTENT WITH PREVIOUSLY REPORTED AND CALLED VALUE.  Glucose, capillary     Status: Abnormal   Collection Time: 11/24/16  7:38 AM  Result Value Ref Range   Glucose-Capillary 166 (H) 65 - 99 mg/dL   Comment 1 Notify RN    Comment 2  Document in Chart     ROS: see hpi  For positives   Physical Exam: Vitals:   11/24/16 0700 11/24/16 0739  BP: 99/67   Pulse: 88 91  Resp: (!) 25 20  Temp:  (!) 96.4 F (35.8 C)  SpO2: 93% 96%     General: alert elderly WM  NAD / OX3  . Pleasant  HEENT: [place]  But will not move head sec to pain with head movements / blind  non icteric / MM dry  Neck: no jvd / decr mvemtn sec to pain  Heart: Irrge , irreg  VR stable 90s', no mur , rub , gallop appreciated  Lungs: decr at bases other CTA / nonlabored  Abdomen: bs pos sof nt ,nd  Extremities: no pedal edema Skin: no overt rash or pedal ulcers  Neuro: Alert OX3 moves all extrem.   Dialysis Access: pos bruit  LFA AVF   Dialysis Orders: [facility]: [facility]  on MWF  4 hrs 0 min, EDW 88.5 (kg), Dialysate 2.0 K, 2.5 Ca,  L FA AV F- Heparin 2800.     Mircera 50 mg  on -11/10/16  Last hgb 12.0 On 11/17/16  No Vit D    Assessment/Plan  1. ESRD -  HD today on scheule  2. Febrile Illness/ SIRS= wu per admit  On Vanco / Meropenem ( ?? For Meningitis with HA / Nausea  / INr ^  So no LP ) 3. Hypertension/volume  - on midodrine / no excess volume on exam or cxr. 4. A fib / RVR- stable now Cardizem drip in Er  / Coumadin per pharm. 5. Anemia  - hgb 10.0  ESA  None  as op  Monitor HGG trend  6. Metabolic bone disease -  Sensipar, binder phoslo / no vit d on hd  7. Nutrition - renal  Carb mod  ,renal vit. 8. Dm type 2 9.  Blind / Glaucoma / Deg. Macular  DZ 10. Depression = on Zoloft  Ernest Haber, PA-C [place] [phone] 11/24/2016, 9:57 AM    Renal Attending:  I agree with note above.  When I entered room RN concerned about unresponsiveness and  hypopnea after pt received 2.31m IV valium for neck spasms.  Pt given Romazicon with marked improvement. Leanndra Pember C

## 2016-11-24 NOTE — Progress Notes (Signed)
   11/24/16 1500  Vitals  BP (!) 86/61  MAP (mmHg) 70  Pulse Rate 73  ECG Heart Rate 76  Resp 20  Oxygen Therapy  SpO2 94 %  decreased cardizem gtt to 5mg /hr

## 2016-11-24 NOTE — Consult Note (Signed)
   [facility] CM Inpatient Consult   11/24/2016  CHADD TOLLISON 1940-07-03 [id number]  Patient is currently active with [facility] for chronic disease management services.  Patient has been engaged by a [place] and [facility] Pharmacist.  Patient admitted to rule out Meningitis. Will continue to follow for progress and disposition needs for community care management.   Will follow up with Inpatient Case Manager aware that [facility] [place] [facility] following. Of note, [facility] Care Management services does not replace or interfere with any services that are needed or arranged by inpatient case management or social work.  For additional questions or referrals please contact:  Natividad Brood, RN BSN [facility] Liaison  [phone] business mobile phone Toll free office [phone]

## 2016-11-24 NOTE — Progress Notes (Addendum)
Pharmacy Antibiotic Note  Alexander [name] is a 76 y.o. male admitted on 11/23/2016 with rule out meningitis .  ID has discontinued the Merrem.  Pharmacy has been consulted for Gentamicin dosing per ID request and continuing IV Vancomycin in patient with  ESRD on HD, usual MWF.  Scheduled for HD this evening.  Vancomycin and Gentamicin to cover for the possibility of prosthetic aortic valve endocarditis pending final blood culture results  Plan: Gentamicin 150mg  IV x1 (~2mg /kg x1) tonight after HD then 70mg  IV qHD MWF (~1mg /kg/dose)  Vancomycin 1g IV qHD -MWF- give dose after HD tonight. Monitor levels to rule out accumulation.  Height: 5\' 10"  (177.8 cm) Weight: 195 lb (88.5 kg) IBW/kg (Calculated) : 73 kg    Temp (24hrs), Avg:97.9 F (36.6 C), Min:96.1 F (35.6 C), Max:103.1 F (39.5 C)   Recent Labs Lab 11/23/16 1546 11/23/16 1951 11/24/16 0312 11/24/16 0604  WBC 13.5*  --  13.1*  --   CREATININE 7.34*  --   --  8.15*  LATICACIDVEN  --  1.23  --   --     Estimated Creatinine Clearance: 8.6 mL/min (A) (by C-G formula based on SCr of 8.15 mg/dL (H)).    Allergies  Allergen Reactions  . Penicillins Swelling    SWELLING REACTION UNSPECIFIED   PATIENT HAD A PCN REACTION WITH IMMEDIATE RASH, FACIAL/TONGUE/THROAT SWELLING, SOB, OR LIGHTHEADEDNESS WITH HYPOTENSION:  #  #  #  YES  #  #  #   Has patient had a PCN reaction causing severe rash involving mucus membranes or skin necrosis: No Has patient had a PCN reaction that required hospitalization No Has patient had a PCN reaction occurring within the last 10 years: No If all of the above answers are "NO", then may proceed with Cephalosporin use.  . Codeine Nausea Only  . Tramadol Nausea Only   Nicole Cella, RPh Clinical Pharmacist Pager: [phone]-10P [phone] [place] [phone] 11/24/2016 5:48 PM

## 2016-11-24 NOTE — Progress Notes (Signed)
PCCM Attending  Consulted for encephalopathy On our exam encephalopathy resolved after flumazenil administered PCCM will hold off on consult, call if questions or change in status  Roselie Awkward, MD [place] PCCM Pager: [phone] Cell: [phone] After 3pm or if no response, call [phone]

## 2016-11-24 NOTE — ED Notes (Signed)
Pt states no chest pain at this time. Still constant headache. Pt seems more relaxed.

## 2016-11-24 NOTE — Progress Notes (Signed)
PHARMACY - PHYSICIAN COMMUNICATION CRITICAL VALUE ALERT - BLOOD CULTURE IDENTIFICATION (BCID)  Results for orders placed or performed during the hospital encounter of 11/23/16  Blood Culture ID Panel (Reflexed) (Collected: 11/23/2016  8:22 PM)  Result Value Ref Range   Enterococcus species NOT DETECTED NOT DETECTED   Listeria monocytogenes NOT DETECTED NOT DETECTED   Staphylococcus species NOT DETECTED NOT DETECTED   Staphylococcus aureus NOT DETECTED NOT DETECTED   Streptococcus species DETECTED (A) NOT DETECTED   Streptococcus agalactiae NOT DETECTED NOT DETECTED   Streptococcus pneumoniae NOT DETECTED NOT DETECTED   Streptococcus pyogenes NOT DETECTED NOT DETECTED   Acinetobacter baumannii NOT DETECTED NOT DETECTED   Enterobacteriaceae species NOT DETECTED NOT DETECTED   Enterobacter cloacae complex NOT DETECTED NOT DETECTED   Escherichia coli NOT DETECTED NOT DETECTED   Klebsiella oxytoca NOT DETECTED NOT DETECTED   Klebsiella pneumoniae NOT DETECTED NOT DETECTED   Proteus species NOT DETECTED NOT DETECTED   Serratia marcescens NOT DETECTED NOT DETECTED   Haemophilus influenzae NOT DETECTED NOT DETECTED   Neisseria meningitidis NOT DETECTED NOT DETECTED   Pseudomonas aeruginosa NOT DETECTED NOT DETECTED   Candida albicans NOT DETECTED NOT DETECTED   Candida glabrata NOT DETECTED NOT DETECTED   Candida krusei NOT DETECTED NOT DETECTED   Candida parapsilosis NOT DETECTED NOT DETECTED   Candida tropicalis NOT DETECTED NOT DETECTED    Name of physician (or Provider) Contacted: Dr. Karleen Hampshire  Changes to prescribed antibiotics required: Discussed blood culture results with Dr. Karleen Hampshire and she plans to consult ID for further treatment recommendations.  Norva Riffle 11/24/2016  1:33 PM

## 2016-11-24 NOTE — Consult Note (Addendum)
[facility] for Infectious Disease    Date of Admission:  11/23/2016           Day 1 vancomycin        Day 1 meropenem       Reason for Consult: Streptococcal bacteremia    Referring Provider: Dr. Hosie Poisson  Assessment: Alexander [name] has streptococcal bacteremia. He has headache but non-pneumococcal species of strep are not very likely to cause meningitis. I am concerned about the possibility of cervical discitis as well as prosthetic aortic valve endocarditis and pacemaker endocarditis. I will repeat blood cultures and order a transthoracic echocardiogram. I will change him to vancomycin and gentamicin to cover for the possibility of prosthetic aortic valve endocarditis pending final blood culture results. I will follow with you.   Plan: 1. Change to vancomycin and gentamicin pending final blood culture results 2. I will need more information about his penicillin allergy and whether or not he has tolerated cephalosporins in the past 3. Repeat blood cultures 4. Transthoracic echocardiogram   Principal Problem:   Streptococcal bacteremia Active Problems:   S/P TAVR (transcatheter aortic valve replacement)   Presence of permanent cardiac pacemaker   Anemia, unspecified   Severe aortic stenosis   PAD (peripheral artery disease) (HCC)   ESRD (end stage renal disease) on dialysis (HCC)   Hyperlipidemia   Diabetic peripheral neuropathy (HCC)   Atrial fibrillation with RVR (HCC)   Glaucoma   GERD (gastroesophageal reflux disease)   Hypomagnesemia   Type 2 diabetes mellitus ([place])   Depression   SIRS (systemic inflammatory response syndrome) ([place])   . atorvastatin  20 mg Oral Daily  . bromocriptine  5 mg Oral QHS  . calcium acetate  667 mg Oral TID WC  . cinacalcet  30 mg Oral QPM  . dorzolamide-timolol  1 drop Right Eye BID  . loratadine  10 mg Oral Daily  . metoprolol tartrate  25 mg Oral BID  . midodrine  15 mg Oral Q M,W,F-HD  . pantoprazole  40 mg Oral  Daily  . polyethylene glycol  17 g Oral Daily  . predniSONE  5 mg Oral Daily  . senna-docusate  1 tablet Oral BID  . [START ON 11/25/2016] sertraline  50 mg Oral Daily    HPI: Alexander [name] is a 76 y.o. male with end-stage renal disease on hemodialysis. He has a history of symptomatic bradycardia and underwent permanent pacemaker placement. He also has a history of aortic stenosis and underwent TAVR in June of this year. Two days ago he had the fairly sudden onset of fever, headache, severe neck pain and confusion leading to admission. His temperature was 103.1. Lumbar puncture could not be performed because of his elevated INR and anticoagulation. He was started on broad empiric antibiotic therapy. Both admission blood cultures are growing a streptococcus species. It is not pneumococcus according to the BCID.   Review of Systems: Review of Systems  Unable to perform ROS: Mental acuity    Past Medical History:  Diagnosis Date  . Anemia   . Aortic stenosis 06/15/12   TEE - EF 95-09%; grade 1 diastolic dysfunction; mild/mod aortic valve stenosis; Mitral valve had calcified annulus, mild pulm htn PA peak pressure 83mmHg  . Barrett's esophagus 05/2003  . Bradycardia 2017   [employer] 2240 Assurity dual-lead pacemaker  . Carpal tunnel syndrome, bilateral 11/03/2015  . Colon polyps   . CVA (cerebral infarction)  2004/affected left side  . Depression   . Diabetes mellitus without complication ([place])   . Diabetic peripheral neuropathy ([place]) 10/02/2015  . Diverticulosis   . Dyspnea    with exertion  . End stage renal disease ([place])    hemodialysis 3 times a week  . GERD (gastroesophageal reflux disease)   . Glaucoma   . Hyperlipidemia   . Hypertension   . Kidney stones   . Legally blind   . Macular degeneration    both eyes  . Orthostatic hypotension 09/09/2015  . Paroxysmal atrial fibrillation (HCC)   . Peptic ulcer    bleeding, 1969  . Presence of permanent cardiac pacemaker     . S/P epidural steroid injection    last  injection over 10 years ago  . Seasonal allergies   . Tubular adenoma of colon 07/2001    Social History  Substance Use Topics  . Smoking status: Former Smoker    Packs/day: 2.00    Years: 45.00    Types: Cigarettes    Quit date: 01/20/1998  . Smokeless tobacco: Never Used  . Alcohol use No    Family History  Problem Relation Age of Onset  . Stomach cancer Mother   . Hypertension Father        Died of heart attack  . Heart attack Father   . Stroke Sister   . Heart disease Sister   . Cancer Brother   . Colon cancer Neg Hx    Allergies  Allergen Reactions  . Penicillins Swelling    SWELLING REACTION UNSPECIFIED   PATIENT HAD A PCN REACTION WITH IMMEDIATE RASH, FACIAL/TONGUE/THROAT SWELLING, SOB, OR LIGHTHEADEDNESS WITH HYPOTENSION:  #  #  #  YES  #  #  #   Has patient had a PCN reaction causing severe rash involving mucus membranes or skin necrosis: No Has patient had a PCN reaction that required hospitalization No Has patient had a PCN reaction occurring within the last 10 years: No If all of the above answers are "NO", then may proceed with Cephalosporin use.  . Codeine Nausea Only  . Tramadol Nausea Only    OBJECTIVE: Blood pressure 96/66, pulse 74, temperature (!) 96.2 F (35.7 C), temperature source Axillary, resp. rate 13, height 5\' 10"  (1.778 m), weight 195 lb (88.5 kg), SpO2 93 %.  Physical Exam  Constitutional:  He is resting quietly in bed with his eyes closed but intermittently calls out with pain. His daughter is at the bedside.  HENT:  Very dry mouth.  Neck:  He seems to have severe pain when just barely touching his neck and shoulders.  Cardiovascular: Normal rate.   No murmur heard. Irregularly irregular rhythm.  Pulmonary/Chest: Effort normal and breath sounds normal. He has no wheezes. He has no rales.  Abdominal: Soft. There is no tenderness.  Musculoskeletal: Normal range of motion. He exhibits no  edema or tenderness.  Left forearm AV fistula without signs of infection.  Skin: No rash noted.  Scattered ecchymoses on his arms.    Lab Results Lab Results  Component Value Date   WBC 13.1 (H) 11/24/2016   HGB 10.0 (L) 11/24/2016   HCT 31.1 (L) 11/24/2016   MCV 96.6 11/24/2016   PLT 103 (L) 11/24/2016    Lab Results  Component Value Date   CREATININE 8.15 (H) 11/24/2016   BUN 50 (H) 11/24/2016   NA 136 11/24/2016   K 4.2 11/24/2016   CL 98 (L) 11/24/2016   CO2 24  11/24/2016    Lab Results  Component Value Date   ALT 37 11/23/2016   AST 55 (H) 11/23/2016   ALKPHOS 70 11/23/2016   BILITOT 1.2 11/23/2016     Microbiology: Recent Results (from the past 240 hour(s))  Blood Culture (routine x 2)     Status: None (Preliminary result)   Collection Time: 11/23/16  8:15 PM  Result Value Ref Range Status   Specimen Description BLOOD RIGHT ANTECUBITAL  Final   Special Requests   Final    BOTTLES DRAWN AEROBIC AND ANAEROBIC Blood Culture adequate volume   Culture  Setup Time   Final    GRAM POSITIVE COCCI IN PAIRS AND CHAINS IN BOTH AEROBIC AND ANAEROBIC BOTTLES IDENTIFICATION TO FOLLOW CRITICAL VALUE NOTED.  VALUE IS CONSISTENT WITH PREVIOUSLY REPORTED AND CALLED VALUE.    Culture GRAM POSITIVE COCCI IN PAIRS AND CHAINS  Final   Report Status PENDING  Incomplete  Blood Culture (routine x 2)     Status: None (Preliminary result)   Collection Time: 11/23/16  8:22 PM  Result Value Ref Range Status   Specimen Description BLOOD RIGHT WRIST  Final   Special Requests   Final    BOTTLES DRAWN AEROBIC AND ANAEROBIC Blood Culture adequate volume   Culture  Setup Time   Final    GRAM POSITIVE COCCI IN PAIRS AND CHAINS IN BOTH AEROBIC AND ANAEROBIC BOTTLES Organism ID to follow CRITICAL RESULT CALLED TO, READ BACK BY AND VERIFIED WITH: MVonita Moss.D. 10:35 11/24/16 ([email or web address])    Culture GRAM POSITIVE COCCI IN PAIRS AND CHAINS  Final   Report Status PENDING  Incomplete  Blood  Culture ID Panel (Reflexed)     Status: Abnormal   Collection Time: 11/23/16  8:22 PM  Result Value Ref Range Status   Enterococcus species NOT DETECTED NOT DETECTED Final   Listeria monocytogenes NOT DETECTED NOT DETECTED Final   Staphylococcus species NOT DETECTED NOT DETECTED Final   Staphylococcus aureus NOT DETECTED NOT DETECTED Final   Streptococcus species DETECTED (A) NOT DETECTED Final    Comment: Not Enterococcus species, Streptococcus agalactiae, Streptococcus pyogenes, or Streptococcus pneumoniae. CRITICAL RESULT CALLED TO, READ BACK BY AND VERIFIED WITH: M. Radford Pax Pharm.D. 10:35 11/24/16 ([email or web address])    Streptococcus agalactiae NOT DETECTED NOT DETECTED Final   Streptococcus pneumoniae NOT DETECTED NOT DETECTED Final   Streptococcus pyogenes NOT DETECTED NOT DETECTED Final   Acinetobacter baumannii NOT DETECTED NOT DETECTED Final   Enterobacteriaceae species NOT DETECTED NOT DETECTED Final   Enterobacter cloacae complex NOT DETECTED NOT DETECTED Final   Escherichia coli NOT DETECTED NOT DETECTED Final   Klebsiella oxytoca NOT DETECTED NOT DETECTED Final   Klebsiella pneumoniae NOT DETECTED NOT DETECTED Final   Proteus species NOT DETECTED NOT DETECTED Final   Serratia marcescens NOT DETECTED NOT DETECTED Final   Haemophilus influenzae NOT DETECTED NOT DETECTED Final   Neisseria meningitidis NOT DETECTED NOT DETECTED Final   Pseudomonas aeruginosa NOT DETECTED NOT DETECTED Final   Candida albicans NOT DETECTED NOT DETECTED Final   Candida glabrata NOT DETECTED NOT DETECTED Final   Candida krusei NOT DETECTED NOT DETECTED Final   Candida parapsilosis NOT DETECTED NOT DETECTED Final   Candida tropicalis NOT DETECTED NOT DETECTED Final  MRSA PCR Screening     Status: None   Collection Time: 11/24/16  3:25 AM  Result Value Ref Range Status   MRSA by PCR NEGATIVE NEGATIVE Final    Comment:        The  GeneXpert MRSA Assay (FDA approved for NASAL specimens only), is one  component of a comprehensive MRSA colonization surveillance program. It is not intended to diagnose MRSA infection nor to guide or monitor treatment for MRSA infections.     Michel Bickers, MD [facility] [place] [facility] [phone] pager   [phone] cell 11/24/2016, 5:31 PM

## 2016-11-24 NOTE — Progress Notes (Signed)
HD tx initiated via 15G x2 w/o problem, pull/push/flush equally w/o problem, VSS but low bp, received TO from Dr. Hollie Salk ok to give his MWF dose of Midodrine again since it was given at 1440 and not given prior to HD, that was admin prior to pt being cannulated, will cont to monitor while on HD tx

## 2016-11-24 NOTE — Progress Notes (Signed)
PROGRESS NOTE    Alexander [name]  XBM:[id number] DOB: [birth year] DOA: 11/23/2016 PCP: Deland Pretty, MD    Brief Narrative:  Alexander [name] is a 76 y.o. male with medical history significant of anemia, aortic stenosis, PUD, GERD, Barrett's esophagus, carpal tunnel syndrome, paroxysmal atrial fibrillation, history of bradycardia (S/Pdual lead pacemaker placement), CVA, depression, type 2 diabetes, diabetic peripheral neuropathy, diverticulosis, ESRD on hemodialysis, glaucoma, hyperlipidemia, hypertension, nephrolithiasis, macular degeneration/legally blind, seasonal allergies, tubular adenoma of the colon who is coming to the emergency department generalized body aches and malaise, muscle spasms of all extremities since monday after he finished hemodialysis. He was admitted for evaluation of meningitis, to step down. Meanwhile he developed a fib with RVR and cardizem gtt was started.   Earlier this afternoon, pt became encephalopathic after he received 2.5 mg of valium , became unresponsive and hypoxic. Dr Florene Glen ordered flumazenil and he reverted back to baseline. Valium was discontinued.  Blood cultures from both aerobic and anaerobic bottles are positive for streptococcus species.  ID Dr Megan Salon consulted for appropriate antibiotics.   Assessment & Plan:   Principal Problem:   Atrial fibrillation with RVR (HCC) Active Problems:   Anemia, unspecified   ESRD (end stage renal disease) on dialysis (HCC)   Hyperlipidemia   Glaucoma   GERD (gastroesophageal reflux disease)   Hypomagnesemia   Type 2 diabetes mellitus (HCC)   Depression   SIRS (systemic inflammatory response syndrome) (HCC)   Generalized malaise and mild rhabdomyolysis/ SIRS:  Suspected all this is due to strep bacteremia. He is alert and oriented and no meningeal signs except for headache and neck pain. Neck stiffness couldn't be elicited.  CT head without contrast showed old infarcts, CT neck shows spondylosis.  CT chest  without contrast does not show any pneumonia.  Currently on IV meropenem and vancomycin.  Blood cultures showing strep species.  ID consult for tomorrow.  Currently afebrile. Leukocytosis with left shift seen.  Repeat cbc in am.     afib with RVR, pt has a h/o PAF, was on metoprolol , was on hold for hypotension.  Started on Cardizem gtt and tapered off as his rate is better.  Probably brought on by bacteremia and dehydration.    Elevated troponin's; From demand ischemia from afib with RVR.   ESRD on HD:  Dr Florene Glen on board for HD.    Anemia of chronic disease:  Hemoglobin at 10. Monitor.   TYPE 2 DM:  CBG (last 3)   Recent Labs  11/24/16 0738 11/24/16 1133  GLUCAP 166* 196*    Resume SSI.    H/o severe AS, s/p TAVR:   Was on coumadin at home, which is being held for possible LP in am.   GERD:  Resume PPI.    HYPOTENSION; on midodrine.  Asymptomatic.     DVT prophylaxis:heparin sq Code Status: (Full Family Communication: daughter at bedside.  Disposition Plan: pending further evaluation..   Consultants:   Nephrology  ID  PCCM over the phone.   Procedures: HD  Antimicrobials: MEROPENAM AND VANCOMYCIN.   Subjective: SLEEPY.  Headache and neck pain.   Objective: Vitals:   11/24/16 1539 11/24/16 1600 11/24/16 1615 11/24/16 1630  BP:  (!) 84/61 92/65 95/62   Pulse: 77 71 78 77  Resp: 18 (!) 22 18 17   Temp:      TempSrc:      SpO2: 98% 95% 94% 93%  Weight:      Height:  Intake/Output Summary (Last 24 hours) at 11/24/16 1700 Last data filed at 11/24/16 1400  Gross per 24 hour  Intake          1842.17 ml  Output                0 ml  Net          1842.17 ml   Filed Weights   11/23/16 1531  Weight: 88.5 kg (195 lb)    Examination:  General exam: Appears lethargic, with neck tenderness on palpation. Respiratory system: Clear to auscultation. Respiratory effort normal. Cardiovascular system: S1 & S2 heard,irregular. No JVD,  murmurs, rubs, gallops or clicks. No pedal edema. Gastrointestinal system: Abdomen is nondistended, soft and nontender. No organomegaly or masses felt. Normal bowel sounds heard. Central nervous system: Alert and oriented.  Extremities: Symmetric 5 x 5 power. Skin: No rashes, lesions or ulcers Psychiatry: Mood & affect appropriate.     Data Reviewed: I have personally reviewed following labs and imaging studies  CBC:  Recent Labs Lab 11/23/16 1546 11/24/16 0312  WBC 13.5* 13.1*  NEUTROABS  --  11.5*  HGB 11.1* 10.0*  HCT 35.4* 31.1*  MCV 98.6 96.6  PLT 123* 235*   Basic Metabolic Panel:  Recent Labs Lab 11/23/16 1546 11/23/16 1936 11/24/16 0604  NA 136  --  136  K 4.0  --  4.2  CL 95*  --  98*  CO2 25  --  24  GLUCOSE 117*  --  174*  BUN 40*  --  50*  CREATININE 7.34*  --  8.15*  CALCIUM 8.4*  --  7.9*  MG  --  1.5*  --   PHOS  --   --  4.1   GFR: Estimated Creatinine Clearance: 8.6 mL/min (A) (by C-G formula based on SCr of 8.15 mg/dL (H)). Liver Function Tests:  Recent Labs Lab 11/23/16 1936 11/24/16 0604  AST 55*  --   ALT 37  --   ALKPHOS 70  --   BILITOT 1.2  --   PROT 6.9  --   ALBUMIN 3.0* 2.6*   No results for input(s): LIPASE, AMYLASE in the last 168 hours. No results for input(s): AMMONIA in the last 168 hours. Coagulation Profile:  Recent Labs Lab 11/23/16 1936 11/24/16 0312  INR 3.08 3.72   Cardiac Enzymes:  Recent Labs Lab 11/23/16 1936 11/24/16 0312 11/24/16 0604  CKTOTAL 630*  --   --   TROPONINI 0.09* 0.07* 0.07*   BNP (last 3 results)  Recent Labs  11/11/16 1143  PROBNP 18,028*   HbA1C: No results for input(s): HGBA1C in the last 72 hours. CBG:  Recent Labs Lab 11/24/16 0738 11/24/16 1133  GLUCAP 166* 196*   Lipid Profile: No results for input(s): CHOL, HDL, LDLCALC, TRIG, CHOLHDL, LDLDIRECT in the last 72 hours. Thyroid Function Tests: No results for input(s): TSH, T4TOTAL, FREET4, T3FREE, THYROIDAB in  the last 72 hours. Anemia Panel: No results for input(s): VITAMINB12, FOLATE, FERRITIN, TIBC, IRON, RETICCTPCT in the last 72 hours. Sepsis Labs:  Recent Labs Lab 11/23/16 1951  LATICACIDVEN 1.23    Recent Results (from the past 240 hour(s))  Blood Culture (routine x 2)     Status: None (Preliminary result)   Collection Time: 11/23/16  8:15 PM  Result Value Ref Range Status   Specimen Description BLOOD RIGHT ANTECUBITAL  Final   Special Requests   Final    BOTTLES DRAWN AEROBIC AND ANAEROBIC Blood Culture adequate volume  Culture  Setup Time   Final    GRAM POSITIVE COCCI IN PAIRS AND CHAINS IN BOTH AEROBIC AND ANAEROBIC BOTTLES IDENTIFICATION TO FOLLOW CRITICAL VALUE NOTED.  VALUE IS CONSISTENT WITH PREVIOUSLY REPORTED AND CALLED VALUE.    Culture GRAM POSITIVE COCCI IN PAIRS AND CHAINS  Final   Report Status PENDING  Incomplete  Blood Culture (routine x 2)     Status: None (Preliminary result)   Collection Time: 11/23/16  8:22 PM  Result Value Ref Range Status   Specimen Description BLOOD RIGHT WRIST  Final   Special Requests   Final    BOTTLES DRAWN AEROBIC AND ANAEROBIC Blood Culture adequate volume   Culture  Setup Time   Final    GRAM POSITIVE COCCI IN PAIRS AND CHAINS IN BOTH AEROBIC AND ANAEROBIC BOTTLES Organism ID to follow CRITICAL RESULT CALLED TO, READ BACK BY AND VERIFIED WITH: MVonita Moss.D. 10:35 11/24/16 ([email or web address])    Culture GRAM POSITIVE COCCI IN PAIRS AND CHAINS  Final   Report Status PENDING  Incomplete  Blood Culture ID Panel (Reflexed)     Status: Abnormal   Collection Time: 11/23/16  8:22 PM  Result Value Ref Range Status   Enterococcus species NOT DETECTED NOT DETECTED Final   Listeria monocytogenes NOT DETECTED NOT DETECTED Final   Staphylococcus species NOT DETECTED NOT DETECTED Final   Staphylococcus aureus NOT DETECTED NOT DETECTED Final   Streptococcus species DETECTED (A) NOT DETECTED Final    Comment: Not Enterococcus species,  Streptococcus agalactiae, Streptococcus pyogenes, or Streptococcus pneumoniae. CRITICAL RESULT CALLED TO, READ BACK BY AND VERIFIED WITH: M. Radford Pax Pharm.D. 10:35 11/24/16 ([email or web address])    Streptococcus agalactiae NOT DETECTED NOT DETECTED Final   Streptococcus pneumoniae NOT DETECTED NOT DETECTED Final   Streptococcus pyogenes NOT DETECTED NOT DETECTED Final   Acinetobacter baumannii NOT DETECTED NOT DETECTED Final   Enterobacteriaceae species NOT DETECTED NOT DETECTED Final   Enterobacter cloacae complex NOT DETECTED NOT DETECTED Final   Escherichia coli NOT DETECTED NOT DETECTED Final   Klebsiella oxytoca NOT DETECTED NOT DETECTED Final   Klebsiella pneumoniae NOT DETECTED NOT DETECTED Final   Proteus species NOT DETECTED NOT DETECTED Final   Serratia marcescens NOT DETECTED NOT DETECTED Final   Haemophilus influenzae NOT DETECTED NOT DETECTED Final   Neisseria meningitidis NOT DETECTED NOT DETECTED Final   Pseudomonas aeruginosa NOT DETECTED NOT DETECTED Final   Candida albicans NOT DETECTED NOT DETECTED Final   Candida glabrata NOT DETECTED NOT DETECTED Final   Candida krusei NOT DETECTED NOT DETECTED Final   Candida parapsilosis NOT DETECTED NOT DETECTED Final   Candida tropicalis NOT DETECTED NOT DETECTED Final  MRSA PCR Screening     Status: None   Collection Time: 11/24/16  3:25 AM  Result Value Ref Range Status   MRSA by PCR NEGATIVE NEGATIVE Final    Comment:        The GeneXpert MRSA Assay (FDA approved for NASAL specimens only), is one component of a comprehensive MRSA colonization surveillance program. It is not intended to diagnose MRSA infection nor to guide or monitor treatment for MRSA infections.          Radiology Studies: Dg Chest 2 View  Result Date: 11/23/2016 CLINICAL DATA:  76 year old male with chest pain, shortness of breath on exertion and painful back spasms for 2 days. EXAM: CHEST  2 VIEW COMPARISON:  11/11/2016 and earlier. FINDINGS: Upright AP  and lateral views of the chest. Stable right chest dual lead cardiac  pacemaker. Prior TAVR. Stable cardiac size and mediastinal contours. Lower lung volumes on the AP view. No pneumothorax, pleural effusion or confluent pulmonary opacity. Mild increased pulmonary vascularity but no overt edema. No acute osseous abnormality identified. Calcified aortic atherosclerosis. Negative visible bowel gas pattern. IMPRESSION: 1. Increased pulmonary vascular congestion without overt pulmonary edema. 2. No other No acute cardiopulmonary abnormality. 3.  Aortic Atherosclerosis (ICD10-I70.0). Electronically Signed   By: Genevie Ann M.D.   On: 11/23/2016 16:11   Ct Head Wo Contrast  Result Date: 11/23/2016 CLINICAL DATA:  Headache and intermittent chest pain. EXAM: CT HEAD WITHOUT CONTRAST CT CERVICAL SPINE WITHOUT CONTRAST TECHNIQUE: Multidetector CT imaging of the head and cervical spine was performed following the standard protocol without intravenous contrast. Multiplanar CT image reconstructions of the cervical spine were also generated. COMPARISON:  09/27/2016 head CT and 10/05/2015 CT cervical spine FINDINGS: CT HEAD FINDINGS Brain: Chronic left parietooccipital and inferior left temporal lobe infarcts with encephalomalacia. No acute intracranial hemorrhage, hydrocephalus, intra-axial mass nor extra-axial collections. No new large vascular territory infarction. Patchy small vessel ischemic disease of periventricular white matter likely reflects chronic small vessel ischemia. Vascular: No hyperdense vessel or unexpected calcification. Skull: Negative for fracture or focal lesion. Sinuses/Orbits: Ocular surgeries are noted bilaterally with scleral band on the right and lens replacement bilaterally. Other: None CT CERVICAL SPINE FINDINGS Alignment: Chronic anterolisthesis of C3 on C4, grade 1 up to 2.4 mm. Slight reversal cervical lordosis is again noted with degenerative disc disease from C4 through C7. Skull base and  vertebrae: Negative for acute fracture. Osteoarthritis of the atlantooccipital and atlantodental intervals. Soft tissues and spinal canal: No prevertebral fluid or swelling. No visible canal hematoma. Disc levels: Mild disc space narrowing C2-3 and C3-4 with marked disc space narrowing C4 through C7. Small posterior marginal osteophytes and uncovertebral joint spurring is noted from C4 through C7. Multilevel degenerative facet arthropathy is seen as before. Upper chest: No acute pulmonary abnormality. Other: Moderate bilateral extracranial carotid arteriosclerosis. IMPRESSION: 1. Chronic left parieto-occipital and inferior left temporal lobe infarcts with encephalomalacia. 2. Chronic small vessel ischemic disease of periventricular white matter. No acute intracranial abnormality. 3. Cervical spondylosis, chronic in appearance without acute osseous abnormality. 4. Dense bilateral extracranial carotid arteriosclerosis, previously worked up with [place] in 2016. Electronically Signed   By: Ashley Royalty M.D.   On: 11/23/2016 22:45   Ct Chest Wo Contrast  Result Date: 11/23/2016 CLINICAL DATA:  Intermittent chest pain EXAM: CT CHEST WITHOUT CONTRAST TECHNIQUE: Multidetector CT imaging of the chest was performed following the standard protocol without IV contrast. COMPARISON:  Radiograph 11/23/2016, CT 08/24/2016 FINDINGS: Cardiovascular: Limited evaluation without intravenous contrast. Ectasia of the ascending segment, measuring up to 3.8 cm. Atherosclerotic vascular calcification. Interval aortic valve replacement. Coronary artery calcifications. Intracardiac pacing leads. No pericardial effusion. Mild cardiomegaly. Mediastinum/Nodes: Stable mildly prominent mediastinal lymph nodes with fatty hilus. Midline trachea. 8 mm hypodense nodule left lobe of thyroid. Esophagus within normal limits. Lungs/Pleura: Mild apical emphysema. No consolidation, pleural effusion or pneumothorax. Upper Abdomen: Partially visualized  atrophic kidneys. Musculoskeletal: No acute or suspicious bone lesion. IMPRESSION: 1. Minimal apical emphysema. No acute pulmonary infiltrate, pleural effusion or pneumothorax 2. Interval aortic valve replacement.  Cardiomegaly. 3. Stable mildly prominent mediastinal lymph nodes. 4. Atrophic kidneys Aortic Atherosclerosis (ICD10-I70.0) and Emphysema (ICD10-J43.9). Electronically Signed   By: Donavan Foil M.D.   On: 11/23/2016 22:48   Ct Cervical Spine Wo Contrast  Result Date: 11/23/2016 CLINICAL DATA:  Neck pain EXAM: CT CERVICAL  SPINE WITHOUT CONTRAST TECHNIQUE: Multidetector CT imaging of the cervical spine was performed without intravenous contrast. Multiplanar CT image reconstructions were also generated. COMPARISON:  10/05/2015 CT FINDINGS: This report was also included as part of the same day head CT report but could not be merged. The contents of the report and impression on the same. Alignment: Chronic anterolisthesis of C3 on C4, grade 1 up to 2.4 mm. Slight reversal cervical lordosis is again noted with degenerative disc disease from C4 through C7. Skull base and vertebrae: Negative for acute fracture. Osteoarthritis of the atlantooccipital and atlantodental intervals. Soft tissues and spinal canal: No prevertebral fluid or swelling. No visible canal hematoma. Disc levels: Mild disc space narrowing C2-3 and C3-4 with marked disc space narrowing C4 through C7. Small posterior marginal osteophytes and uncovertebral joint spurring is noted from C4 through C7. Multilevel degenerative facet arthropathy is seen as Before. Upper chest: No acute pulmonary abnormality. Other: Moderate bilateral extracranial carotid arteriosclerosis. IMPRESSION: 1. Cervical spondylosis, chronic in appearance without acute osseous abnormality. 2. Dense bilateral extracranial carotid arteriosclerosis, previously worked up with [place] in 2016. Electronically Signed   By: Ashley Royalty M.D.   On: 11/23/2016 22:52         Scheduled Meds: . atorvastatin  20 mg Oral Daily  . bromocriptine  5 mg Oral QHS  . calcium acetate  667 mg Oral TID WC  . cinacalcet  30 mg Oral QPM  . dorzolamide-timolol  1 drop Right Eye BID  . loratadine  10 mg Oral Daily  . metoprolol tartrate  25 mg Oral BID  . midodrine  15 mg Oral Q M,W,F-HD  . pantoprazole  40 mg Oral Daily  . predniSONE  5 mg Oral Daily  . [START ON 11/25/2016] sertraline  50 mg Oral Daily   Continuous Infusions: . diltiazem (CARDIZEM) infusion 5 mg/hr (11/24/16 1505)  . [START ON 11/25/2016] meropenem (MERREM) IV       LOS: 1 day    Time spent: 45 minutes.    Hosie Poisson, MD [facility] Pager [phone]   If 7PM-7AM, please contact night-coverage [email or web address] Password [email or web address] 11/24/2016, 5:00 PM

## 2016-11-24 NOTE — Patient Outreach (Signed)
[facility]) Care Management Rosa Sanchez Coordination  11/24/2016  [name] [birth year] [id number]  Secure communication via EMR received from [place], notifying of patient's current hospital admission re: Alexander [name],76 y.o.malereferred to [place] from [facility] RN CM on original MD referral. Patient has history including, but not limited to, PAD, Aortic stenosis with TAVR 08/31/16, ESRD on HD (M, W, F), A-Fib on coumadin, DM, CVA in 2004, CHF, macular degeneration with legal blindness, and multiple falls.  Patient presented to hospital yesterday, 11/23/16 for chest pain and general malaise, and was admitted for further workup.  Plan:  [facility] RN CM will follow patient's progress while hospitalized and collaborate with [facility] liaison's for discharge disposition.  [name], RN, BSN, [employer] [facility] Care Management  [phone]

## 2016-11-24 NOTE — Progress Notes (Signed)
   11/24/16 1523  Vitals  BP (!) 87/63  MAP (mmHg) 72  Pulse Rate 82  ECG Heart Rate 77  Resp 19  Oxygen Therapy  SpO2 (!) 71 %  stopped cardizem gtt, called dr Karleen Hampshire, non rebreather applied, rapid response called, dr Florene Glen at bedside, flumazenil 0.2mg  iv given. Pt now able to follow command, drowsy but arousable now

## 2016-11-24 NOTE — Progress Notes (Signed)
Per staff patient became unresponsive and poor respiratory effort post 2.5mg  dose of Valium, given for muscle spasms in neck. After Romazicon administered patient is easily alert and conversant.  When he drifts off to sleep he is easy to arouse and O2 sats remain stable 93-95% on 2L [place].  Rn to call if assistance needed.

## 2016-11-24 NOTE — Progress Notes (Signed)
Pharmacy Antibiotic Note  Alexander [name] is a 76 y.o. male admitted on 11/23/2016 with rule out meningitis .  Pharmacy has been consulted for Merrem dosing. Switching from Cefepime to Merrem. ESRD on HD.   Plan: Merrem 500 mg IV q24h Already on vancomycin  F/U infectious work-up  Height: 5\' 10"  (177.8 cm) Weight: 195 lb (88.5 kg) IBW/kg (Calculated) : 73  Temp (24hrs), Avg:100.9 F (38.3 C), Min:98.7 F (37.1 C), Max:103.1 F (39.5 C)   Recent Labs Lab 11/23/16 1546 11/23/16 1951  WBC 13.5*  --   CREATININE 7.34*  --   LATICACIDVEN  --  1.23    Estimated Creatinine Clearance: 9.6 mL/min (A) (by C-G formula based on SCr of 7.34 mg/dL (H)).    Allergies  Allergen Reactions  . Penicillins Swelling    SWELLING REACTION UNSPECIFIED   PATIENT HAD A PCN REACTION WITH IMMEDIATE RASH, FACIAL/TONGUE/THROAT SWELLING, SOB, OR LIGHTHEADEDNESS WITH HYPOTENSION:  #  #  #  YES  #  #  #   Has patient had a PCN reaction causing severe rash involving mucus membranes or skin necrosis: No Has patient had a PCN reaction that required hospitalization No Has patient had a PCN reaction occurring within the last 10 years: No If all of the above answers are "NO", then may proceed with Cephalosporin use.  . Codeine Nausea Only  . Tramadol Nausea Only    Narda Bonds 11/24/2016 1:23 AM

## 2016-11-25 ENCOUNTER — Other Ambulatory Visit: Payer: Self-pay | Admitting: *Deleted

## 2016-11-25 ENCOUNTER — Inpatient Hospital Stay (HOSPITAL_COMMUNITY): Payer: Medicare Other

## 2016-11-25 ENCOUNTER — Other Ambulatory Visit (HOSPITAL_COMMUNITY): Payer: Medicare Other

## 2016-11-25 DIAGNOSIS — I340 Nonrheumatic mitral (valve) insufficiency: Secondary | ICD-10-CM

## 2016-11-25 DIAGNOSIS — E1122 Type 2 diabetes mellitus with diabetic chronic kidney disease: Secondary | ICD-10-CM

## 2016-11-25 LAB — GLUCOSE, CAPILLARY
GLUCOSE-CAPILLARY: 139 mg/dL — AB (ref 65–99)
GLUCOSE-CAPILLARY: 146 mg/dL — AB (ref 65–99)
Glucose-Capillary: 111 mg/dL — ABNORMAL HIGH (ref 65–99)
Glucose-Capillary: 137 mg/dL — ABNORMAL HIGH (ref 65–99)

## 2016-11-25 LAB — ECHOCARDIOGRAM COMPLETE
AOASC: 30 cm
Area-P 1/2: 3.93 cm2
CHL CUP PV REG GRAD DIAS: 7 mmHg
CHL CUP REG VEL DIAS: 135 cm/s
CHL CUP RV SYS PRESS: 48 mmHg
E/e' ratio: 30.37
EWDT: 164 ms
FS: 25 % — AB (ref 28–44)
Height: 70 in
IVS/LV PW RATIO, ED: 1.28
LA diam index: 2.19 cm/m2
LASIZE: 48 mm
LAVOL: 134 mL
LAVOLA4C: 144 mL
LAVOLIN: 61.3 mL/m2
LDCA: 3.14 cm2
LEFT ATRIUM END SYS DIAM: 48 mm
LV E/e'average: 30.37
LV PW d: 11 mm — AB (ref 0.6–1.1)
LVEEMED: 30.37
LVELAT: 6.42 cm/s
LVOT VTI: 29.1 cm
LVOT peak grad rest: 12 mmHg
LVOTD: 20 mm
LVOTPV: 172 cm/s
LVOTSV: 91 mL
Lateral S' vel: 6.64 cm/s
MV Dec: 164
MV Peak grad: 15 mmHg
MVPKAVEL: 54.8 m/s
MVPKEVEL: 195 m/s
MVSPHT: 48 ms
PISA EROA: 0.02 cm2
RV TAPSE: 13.2 mm
Reg peak vel: 286 cm/s
TDI e' lateral: 6.42
TR max vel: 286 cm/s
VTI: 128 cm
Weight: 3344 oz

## 2016-11-25 LAB — RENAL FUNCTION PANEL
ALBUMIN: 2.5 g/dL — AB (ref 3.5–5.0)
Anion gap: 13 (ref 5–15)
BUN: 24 mg/dL — ABNORMAL HIGH (ref 6–20)
CALCIUM: 8 mg/dL — AB (ref 8.9–10.3)
CO2: 24 mmol/L (ref 22–32)
CREATININE: 4.09 mg/dL — AB (ref 0.61–1.24)
Chloride: 100 mmol/L — ABNORMAL LOW (ref 101–111)
GFR calc Af Amer: 15 mL/min — ABNORMAL LOW (ref >60)
GFR calc non Af Amer: 13 mL/min — ABNORMAL LOW (ref >60)
GLUCOSE: 121 mg/dL — AB (ref 65–99)
PHOSPHORUS: 3.5 mg/dL (ref 2.5–4.6)
Potassium: 3.5 mmol/L (ref 3.5–5.1)
Sodium: 137 mmol/L (ref 135–145)

## 2016-11-25 LAB — CBC WITH DIFFERENTIAL/PLATELET
BASOS PCT: 0 %
Basophils Absolute: 0 10*3/uL (ref 0.0–0.1)
Eosinophils Absolute: 0 10*3/uL (ref 0.0–0.7)
Eosinophils Relative: 0 %
HEMATOCRIT: 29.8 % — AB (ref 39.0–52.0)
HEMOGLOBIN: 9.9 g/dL — AB (ref 13.0–17.0)
LYMPHS ABS: 0.6 10*3/uL — AB (ref 0.7–4.0)
Lymphocytes Relative: 4 %
MCH: 31.5 pg (ref 26.0–34.0)
MCHC: 33.2 g/dL (ref 30.0–36.0)
MCV: 94.9 fL (ref 78.0–100.0)
MONOS PCT: 4 %
Monocytes Absolute: 0.6 10*3/uL (ref 0.1–1.0)
NEUTROS ABS: 12.8 10*3/uL — AB (ref 1.7–7.7)
NEUTROS PCT: 92 %
Platelets: 133 10*3/uL — ABNORMAL LOW (ref 150–400)
RBC: 3.14 MIL/uL — AB (ref 4.22–5.81)
RDW: 17.8 % — ABNORMAL HIGH (ref 11.5–15.5)
WBC: 14 10*3/uL — AB (ref 4.0–10.5)

## 2016-11-25 LAB — PROTIME-INR
INR: 6.72
INR: 7
PROTHROMBIN TIME: 59.9 s — AB (ref 11.4–15.2)
Prothrombin Time: 58.1 seconds — ABNORMAL HIGH (ref 11.4–15.2)

## 2016-11-25 MED ORDER — BISACODYL 5 MG PO TBEC: 10.0000 mg | DELAYED_RELEASE_TABLET | Freq: Every day | ORAL | Status: DC | PRN

## 2016-11-25 MED ORDER — IOPAMIDOL (ISOVUE-300) INJECTION 61%
INTRAVENOUS | Status: AC
  Administered 2016-11-25: 75 mL
  Filled 2016-11-25: qty 75

## 2016-11-25 MED ORDER — DORZOLAMIDE HCL-TIMOLOL MAL 2-0.5 % OP SOLN
1.0000 [drp] | Freq: Two times a day (BID) | OPHTHALMIC | Status: DC
  Administered 2016-11-25 – 2016-12-02 (×14): 1 [drp] via OPHTHALMIC
  Filled 2016-11-25: qty 10

## 2016-11-25 MED ORDER — ORAL CARE MOUTH RINSE
15.0000 mL | Freq: Two times a day (BID) | OROMUCOSAL | Status: DC
  Administered 2016-11-25 – 2016-12-02 (×9): 15 mL via OROMUCOSAL

## 2016-11-25 MED ORDER — BISACODYL 10 MG RE SUPP
10.0000 mg | Freq: Every day | RECTAL | Status: DC | PRN
  Administered 2016-11-25: 10 mg via RECTAL
  Filled 2016-11-25: qty 1

## 2016-11-25 MED ORDER — MUSCLE RUB 10-15 % EX CREA
1.0000 "application " | TOPICAL_CREAM | CUTANEOUS | Status: DC | PRN
  Administered 2016-11-25 – 2016-11-26 (×2): 1 via TOPICAL
  Filled 2016-11-25: qty 85

## 2016-11-25 NOTE — Care Management Note (Addendum)
Case Management Note  Patient Details  Name: FREDDIE DYMEK MRN: [id number] Date of Birth: [birth year]  Subjective/Objective:      Pt admitted chest pain and strept bacteremia - pt also has pacemaker infection on leads- possible meningitis - A fib  Action/Plan:   PTA from home with wife.  Pt is ESRD on outpt HD.   Pt recently had TAVR in June 2018.  Due to illness - pt may need long term IV antibiotics.  CM will continue to follow for discharge needs   Expected Discharge Date:                  Expected Discharge Plan:     In-House Referral:     Discharge planning Services  CM Consult  Post Acute Care Choice:    Choice offered to:     DME Arranged:    DME Agency:     HH Arranged:    HH Agency:     Status of Service:     If discussed at [employer], dates discussed:    Additional Comments: CM informed that pt was recently with [facility] - now discharged from program. Maryclare Labrador, RN 11/25/2016, 3:40 PM

## 2016-11-25 NOTE — Progress Notes (Signed)
Pt returned from dialysis and is resting comfortably. Daughter is in the room with pt.

## 2016-11-25 NOTE — Progress Notes (Signed)
CRITICAL VALUE ALERT  Critical Value:  PT/INR  58.1/6.72  Date & Time Notied:  11/25/2016 0751  Provider Notified: Dr. Karleen Hampshire  Orders Received/Actions taken: rec'd orders @ 0800

## 2016-11-25 NOTE — Progress Notes (Signed)
PROGRESS NOTE    Alexander [name]  RDE:[id number] DOB: 1940/10/02 DOA: 11/23/2016 PCP: Deland Pretty, MD    Brief Narrative:  Alexander [name] is a 76 y.o. male with medical history significant of anemia, aortic stenosis, PUD, GERD, Barrett's esophagus, carpal tunnel syndrome, paroxysmal atrial fibrillation, history of bradycardia (S/Pdual lead pacemaker placement), CVA, depression, type 2 diabetes, diabetic peripheral neuropathy, diverticulosis, ESRD on hemodialysis, glaucoma, hyperlipidemia, hypertension, nephrolithiasis, macular degeneration/legally blind, seasonal allergies, tubular adenoma of the colon who is coming to the emergency department generalized body aches and malaise, muscle spasms of all extremities since monday after he finished hemodialysis. He was admitted for evaluation of meningitis, to step down. Meanwhile he developed a fib with RVR and cardizem gtt was started.   On 8/ 29 afternoon, pt became encephalopathic after he received 2.5 mg of valium , became unresponsive and hypoxic. Dr Florene Glen ordered flumazenil and he reverted back to baseline. Valium was discontinued.  Blood cultures from both aerobic and anaerobic bottles are positive for streptococcus species.  ID Dr Megan Salon consulted for appropriate antibiotics.  8/30 pt reports he had a good night, except for some neck pain.   Assessment & Plan:   Principal Problem:   Streptococcal bacteremia Active Problems:   Anemia, unspecified   Severe aortic stenosis   PAD (peripheral artery disease) (HCC)   ESRD (end stage renal disease) on dialysis (HCC)   Hyperlipidemia   Diabetic peripheral neuropathy (HCC)   S/P TAVR (transcatheter aortic valve replacement)   Atrial fibrillation with RVR (HCC)   Glaucoma   GERD (gastroesophageal reflux disease)   Hypomagnesemia   Type 2 diabetes mellitus (HCC)   Depression   SIRS (systemic inflammatory response syndrome) (HCC)   Presence of permanent cardiac pacemaker   Generalized  malaise and mild rhabdomyolysis/ SIRS:  Suspected all this is due to strep bacteremia. He is alert and oriented and no meningeal signs except for headache and neck pain. Neck stiffness couldn't be elicited.  CT head without contrast showed old infarcts, CT neck shows spondylosis.  CT chest without contrast does not show any pneumonia.  Started on IV meropenem and vancomycin, changed to vancomycin and gentamicin.  Blood cultures showing strep species. ID consulted, recommended getting TTE and possible repeat CT cervical spine with contrast, discussed with DR Jonnie Finner,  Radiology and patient's daughter and ordered repeat CT cervical spine with contrast to evaluate for discitis and epidural abscess.  Currently afebrile. Leukocytosis with left shift seen.  Continue with the same management as above.      Afib with RVR, pt has a h/o PAF, was on metoprolol , was on hold for hypotension.  Started on Cardizem gtt and tapered off as his rate is better.  Probably brought on by bacteremia and dehydration.  Still irregular but rate is under control.    Elevated troponin's; From demand ischemia from afib with RVR.  Pt denies any chest pain.   ESRD on HD:  Nephrologist on board .    Anemia of chronic disease:  Hemoglobin around 9.9. Monitor.   TYPE 2 DM:  CBG (last 3)   Recent Labs  11/24/16 2111 11/25/16 0855 11/25/16 1218  GLUCAP 164* 111* 137*    Resume SSI.    H/o severe AS, s/p TAVR:   Was on coumadin at home, which is being held for supra therapeutic INR.   GERD:  Resume PPI.    HYPOTENSION; on midodrine.  Asymptomatic.   Leukocytosis: suspect its from the bacteremia.  DVT prophylaxis:heparin sq Code Status: (Full Family Communication: daughter at bedside.  Disposition Plan: pending further evaluation..   Consultants:   Nephrology  ID  PCCM over the phone.   Procedures: HD  Antimicrobials: MEROPENAM till 8/29.  Gentamicin from 8/30 and vancomycin  from admission.   Subjective: Headache is better, neck pain persistent.  No nausea or vomiting.  No chest pain or sob.   Objective: Vitals:   11/25/16 0900 11/25/16 1000 11/25/16 1100 11/25/16 1234  BP: 92/71 97/62 (!) 93/59   Pulse: 92 97 93   Resp: (!) 23 18 (!) 21   Temp:   97.6 F (36.4 C)   TempSrc:   Oral   SpO2: 96% 97% 95%   Weight:    94.8 kg (209 lb)  Height:        Intake/Output Summary (Last 24 hours) at 11/25/16 1345 Last data filed at 11/25/16 0900  Gross per 24 hour  Intake          1136.67 ml  Output             1501 ml  Net          -364.33 ml   Filed Weights   11/23/16 1531 11/25/16 1234  Weight: 88.5 kg (195 lb) 94.8 kg (209 lb)    Examination:  General exam:appears in mild distress from neck pain. On 2l it of [place] oxygen.  Respiratory system: diminished at bases, no wheezing or rhonchi.  Cardiovascular system: S1 & S2 heard,irregular. No JVD, murmurs, rubs, gallops or clicks. No pedal edema. Gastrointestinal system: Abdomen is soft NT ND bs+ Central nervous system: Alert and oriented.  Extremities: Symmetric 5 x 5 power. Skin: No rashes, lesions or ulcers Psychiatry: Mood & affect appropriate.     Data Reviewed: I have personally reviewed following labs and imaging studies  CBC:  Recent Labs Lab 11/23/16 1546 11/24/16 0312 11/24/16 2326 11/25/16 0513  WBC 13.5* 13.1* 13.6* 14.0*  NEUTROABS  --  11.5*  --  12.8*  HGB 11.1* 10.0* 9.7* 9.9*  HCT 35.4* 31.1* 29.9* 29.8*  MCV 98.6 96.6 95.2 94.9  PLT 123* 103* 124* 784*   Basic Metabolic Panel:  Recent Labs Lab 11/23/16 1546 11/23/16 1936 11/24/16 0604 11/24/16 2326 11/25/16 0513  NA 136  --  136 134* 137  K 4.0  --  4.2 4.0 3.5  CL 95*  --  98* 97* 100*  CO2 25  --  24 22 24   GLUCOSE 117*  --  174* 170* 121*  BUN 40*  --  50* 68* 24*  CREATININE 7.34*  --  8.15* 8.95* 4.09*  CALCIUM 8.4*  --  7.9* 7.7* 8.0*  MG  --  1.5*  --   --   --   PHOS  --   --  4.1 4.4 3.5    GFR: Estimated Creatinine Clearance: 17.8 mL/min (A) (by C-G formula based on SCr of 4.09 mg/dL (H)). Liver Function Tests:  Recent Labs Lab 11/23/16 1936 11/24/16 0604 11/24/16 2326 11/25/16 0513  AST 55*  --   --   --   ALT 37  --   --   --   ALKPHOS 70  --   --   --   BILITOT 1.2  --   --   --   PROT 6.9  --   --   --   ALBUMIN 3.0* 2.6* 2.5* 2.5*   No results for input(s): LIPASE, AMYLASE in the last 168  hours. No results for input(s): AMMONIA in the last 168 hours. Coagulation Profile:  Recent Labs Lab 11/23/16 1936 11/24/16 0312 11/25/16 0513 11/25/16 1018  INR 3.08 3.72 6.72* 7.00*   Cardiac Enzymes:  Recent Labs Lab 11/23/16 1936 11/24/16 0312 11/24/16 0604  CKTOTAL 630*  --   --   TROPONINI 0.09* 0.07* 0.07*   BNP (last 3 results)  Recent Labs  11/11/16 1143  PROBNP 18,028*   HbA1C: No results for input(s): HGBA1C in the last 72 hours. CBG:  Recent Labs Lab 11/24/16 1133 11/24/16 1726 11/24/16 2111 11/25/16 0855 11/25/16 1218  GLUCAP 196* 193* 164* 111* 137*   Lipid Profile: No results for input(s): CHOL, HDL, LDLCALC, TRIG, CHOLHDL, LDLDIRECT in the last 72 hours. Thyroid Function Tests: No results for input(s): TSH, T4TOTAL, FREET4, T3FREE, THYROIDAB in the last 72 hours. Anemia Panel: No results for input(s): VITAMINB12, FOLATE, FERRITIN, TIBC, IRON, RETICCTPCT in the last 72 hours. Sepsis Labs:  Recent Labs Lab 11/23/16 1951  LATICACIDVEN 1.23    Recent Results (from the past 240 hour(s))  Blood Culture (routine x 2)     Status: None (Preliminary result)   Collection Time: 11/23/16  8:15 PM  Result Value Ref Range Status   Specimen Description BLOOD RIGHT ANTECUBITAL  Final   Special Requests   Final    BOTTLES DRAWN AEROBIC AND ANAEROBIC Blood Culture adequate volume   Culture  Setup Time   Final    GRAM POSITIVE COCCI IN PAIRS AND CHAINS IN BOTH AEROBIC AND ANAEROBIC BOTTLES CRITICAL VALUE NOTED.  VALUE IS  CONSISTENT WITH PREVIOUSLY REPORTED AND CALLED VALUE.    Culture GRAM POSITIVE COCCI IN PAIRS AND CHAINS  Final   Report Status PENDING  Incomplete  Blood Culture (routine x 2)     Status: None (Preliminary result)   Collection Time: 11/23/16  8:22 PM  Result Value Ref Range Status   Specimen Description BLOOD RIGHT WRIST  Final   Special Requests   Final    BOTTLES DRAWN AEROBIC AND ANAEROBIC Blood Culture adequate volume   Culture  Setup Time   Final    GRAM POSITIVE COCCI IN PAIRS AND CHAINS IN BOTH AEROBIC AND ANAEROBIC BOTTLES CRITICAL RESULT CALLED TO, READ BACK BY AND VERIFIED WITH: Truett Perna.D. 10:35 11/24/16 ([email or web address])    Culture GRAM POSITIVE COCCI IN PAIRS AND CHAINS  Final   Report Status PENDING  Incomplete  Blood Culture ID Panel (Reflexed)     Status: Abnormal   Collection Time: 11/23/16  8:22 PM  Result Value Ref Range Status   Enterococcus species NOT DETECTED NOT DETECTED Final   Listeria monocytogenes NOT DETECTED NOT DETECTED Final   Staphylococcus species NOT DETECTED NOT DETECTED Final   Staphylococcus aureus NOT DETECTED NOT DETECTED Final   Streptococcus species DETECTED (A) NOT DETECTED Final    Comment: Not Enterococcus species, Streptococcus agalactiae, Streptococcus pyogenes, or Streptococcus pneumoniae. CRITICAL RESULT CALLED TO, READ BACK BY AND VERIFIED WITH: M. Radford Pax Pharm.D. 10:35 11/24/16 ([email or web address])    Streptococcus agalactiae NOT DETECTED NOT DETECTED Final   Streptococcus pneumoniae NOT DETECTED NOT DETECTED Final   Streptococcus pyogenes NOT DETECTED NOT DETECTED Final   Acinetobacter baumannii NOT DETECTED NOT DETECTED Final   Enterobacteriaceae species NOT DETECTED NOT DETECTED Final   Enterobacter cloacae complex NOT DETECTED NOT DETECTED Final   Escherichia coli NOT DETECTED NOT DETECTED Final   Klebsiella oxytoca NOT DETECTED NOT DETECTED Final   Klebsiella pneumoniae NOT DETECTED NOT DETECTED Final  Proteus species NOT DETECTED NOT  DETECTED Final   Serratia marcescens NOT DETECTED NOT DETECTED Final   Haemophilus influenzae NOT DETECTED NOT DETECTED Final   Neisseria meningitidis NOT DETECTED NOT DETECTED Final   Pseudomonas aeruginosa NOT DETECTED NOT DETECTED Final   Candida albicans NOT DETECTED NOT DETECTED Final   Candida glabrata NOT DETECTED NOT DETECTED Final   Candida krusei NOT DETECTED NOT DETECTED Final   Candida parapsilosis NOT DETECTED NOT DETECTED Final   Candida tropicalis NOT DETECTED NOT DETECTED Final  MRSA PCR Screening     Status: None   Collection Time: 11/24/16  3:25 AM  Result Value Ref Range Status   MRSA by PCR NEGATIVE NEGATIVE Final    Comment:        The GeneXpert MRSA Assay (FDA approved for NASAL specimens only), is one component of a comprehensive MRSA colonization surveillance program. It is not intended to diagnose MRSA infection nor to guide or monitor treatment for MRSA infections.          Radiology Studies: Dg Chest 2 View  Result Date: 11/23/2016 CLINICAL DATA:  76 year old male with chest pain, shortness of breath on exertion and painful back spasms for 2 days. EXAM: CHEST  2 VIEW COMPARISON:  11/11/2016 and earlier. FINDINGS: Upright AP and lateral views of the chest. Stable right chest dual lead cardiac pacemaker. Prior TAVR. Stable cardiac size and mediastinal contours. Lower lung volumes on the AP view. No pneumothorax, pleural effusion or confluent pulmonary opacity. Mild increased pulmonary vascularity but no overt edema. No acute osseous abnormality identified. Calcified aortic atherosclerosis. Negative visible bowel gas pattern. IMPRESSION: 1. Increased pulmonary vascular congestion without overt pulmonary edema. 2. No other No acute cardiopulmonary abnormality. 3.  Aortic Atherosclerosis (ICD10-I70.0). Electronically Signed   By: Genevie Ann M.D.   On: 11/23/2016 16:11   Ct Head Wo Contrast  Result Date: 11/23/2016 CLINICAL DATA:  Headache and intermittent  chest pain. EXAM: CT HEAD WITHOUT CONTRAST CT CERVICAL SPINE WITHOUT CONTRAST TECHNIQUE: Multidetector CT imaging of the head and cervical spine was performed following the standard protocol without intravenous contrast. Multiplanar CT image reconstructions of the cervical spine were also generated. COMPARISON:  09/27/2016 head CT and 10/05/2015 CT cervical spine FINDINGS: CT HEAD FINDINGS Brain: Chronic left parietooccipital and inferior left temporal lobe infarcts with encephalomalacia. No acute intracranial hemorrhage, hydrocephalus, intra-axial mass nor extra-axial collections. No new large vascular territory infarction. Patchy small vessel ischemic disease of periventricular white matter likely reflects chronic small vessel ischemia. Vascular: No hyperdense vessel or unexpected calcification. Skull: Negative for fracture or focal lesion. Sinuses/Orbits: Ocular surgeries are noted bilaterally with scleral band on the right and lens replacement bilaterally. Other: None CT CERVICAL SPINE FINDINGS Alignment: Chronic anterolisthesis of C3 on C4, grade 1 up to 2.4 mm. Slight reversal cervical lordosis is again noted with degenerative disc disease from C4 through C7. Skull base and vertebrae: Negative for acute fracture. Osteoarthritis of the atlantooccipital and atlantodental intervals. Soft tissues and spinal canal: No prevertebral fluid or swelling. No visible canal hematoma. Disc levels: Mild disc space narrowing C2-3 and C3-4 with marked disc space narrowing C4 through C7. Small posterior marginal osteophytes and uncovertebral joint spurring is noted from C4 through C7. Multilevel degenerative facet arthropathy is seen as before. Upper chest: No acute pulmonary abnormality. Other: Moderate bilateral extracranial carotid arteriosclerosis. IMPRESSION: 1. Chronic left parieto-occipital and inferior left temporal lobe infarcts with encephalomalacia. 2. Chronic small vessel ischemic disease of periventricular white  matter. No acute  intracranial abnormality. 3. Cervical spondylosis, chronic in appearance without acute osseous abnormality. 4. Dense bilateral extracranial carotid arteriosclerosis, previously worked up with [place] in 2016. Electronically Signed   By: Ashley Royalty M.D.   On: 11/23/2016 22:45   Ct Chest Wo Contrast  Result Date: 11/23/2016 CLINICAL DATA:  Intermittent chest pain EXAM: CT CHEST WITHOUT CONTRAST TECHNIQUE: Multidetector CT imaging of the chest was performed following the standard protocol without IV contrast. COMPARISON:  Radiograph 11/23/2016, CT 08/24/2016 FINDINGS: Cardiovascular: Limited evaluation without intravenous contrast. Ectasia of the ascending segment, measuring up to 3.8 cm. Atherosclerotic vascular calcification. Interval aortic valve replacement. Coronary artery calcifications. Intracardiac pacing leads. No pericardial effusion. Mild cardiomegaly. Mediastinum/Nodes: Stable mildly prominent mediastinal lymph nodes with fatty hilus. Midline trachea. 8 mm hypodense nodule left lobe of thyroid. Esophagus within normal limits. Lungs/Pleura: Mild apical emphysema. No consolidation, pleural effusion or pneumothorax. Upper Abdomen: Partially visualized atrophic kidneys. Musculoskeletal: No acute or suspicious bone lesion. IMPRESSION: 1. Minimal apical emphysema. No acute pulmonary infiltrate, pleural effusion or pneumothorax 2. Interval aortic valve replacement.  Cardiomegaly. 3. Stable mildly prominent mediastinal lymph nodes. 4. Atrophic kidneys Aortic Atherosclerosis (ICD10-I70.0) and Emphysema (ICD10-J43.9). Electronically Signed   By: Donavan Foil M.D.   On: 11/23/2016 22:48   Ct Cervical Spine Wo Contrast  Result Date: 11/23/2016 CLINICAL DATA:  Neck pain EXAM: CT CERVICAL SPINE WITHOUT CONTRAST TECHNIQUE: Multidetector CT imaging of the cervical spine was performed without intravenous contrast. Multiplanar CT image reconstructions were also generated. COMPARISON:  10/05/2015 CT  FINDINGS: This report was also included as part of the same day head CT report but could not be merged. The contents of the report and impression on the same. Alignment: Chronic anterolisthesis of C3 on C4, grade 1 up to 2.4 mm. Slight reversal cervical lordosis is again noted with degenerative disc disease from C4 through C7. Skull base and vertebrae: Negative for acute fracture. Osteoarthritis of the atlantooccipital and atlantodental intervals. Soft tissues and spinal canal: No prevertebral fluid or swelling. No visible canal hematoma. Disc levels: Mild disc space narrowing C2-3 and C3-4 with marked disc space narrowing C4 through C7. Small posterior marginal osteophytes and uncovertebral joint spurring is noted from C4 through C7. Multilevel degenerative facet arthropathy is seen as Before. Upper chest: No acute pulmonary abnormality. Other: Moderate bilateral extracranial carotid arteriosclerosis. IMPRESSION: 1. Cervical spondylosis, chronic in appearance without acute osseous abnormality. 2. Dense bilateral extracranial carotid arteriosclerosis, previously worked up with [place] in 2016. Electronically Signed   By: Ashley Royalty M.D.   On: 11/23/2016 22:52        Scheduled Meds: . atorvastatin  20 mg Oral Daily  . bromocriptine  5 mg Oral QHS  . calcium acetate  667 mg Oral TID WC  . cinacalcet  30 mg Oral QPM  . dorzolamide-timolol  1 drop Both Eyes BID  . loratadine  10 mg Oral Daily  . mouth rinse  15 mL Mouth Rinse BID  . metoprolol tartrate  25 mg Oral BID  . midodrine  15 mg Oral Q M,W,F-HD  . pantoprazole  40 mg Oral Daily  . polyethylene glycol  17 g Oral Daily  . predniSONE  5 mg Oral Daily  . senna-docusate  1 tablet Oral BID  . sertraline  50 mg Oral Daily   Continuous Infusions: . [START ON 11/26/2016] gentamicin    . vancomycin Stopped (11/25/16 0145)     LOS: 2 days    Time spent: 45 minutes.    Debbra Digiulio,  MD [facility] Pager [phone]   If 7PM-7AM,  please contact night-coverage [email or web address] Password [email or web address] 11/25/2016, 1:45 PM

## 2016-11-25 NOTE — Progress Notes (Signed)
[facility] Infusion Coordinator will follow [name] with ID team to support long term IV ABX at home as ordered, if home is DC disposition.   If patient discharges after hours, please call [phone].   [name] 11/25/2016, 5:33 PM

## 2016-11-25 NOTE — Progress Notes (Signed)
[facility] Progress Note  Subjective: pt c/o complaints, except neck still hurts  Vitals:   11/25/16 0900 11/25/16 1000 11/25/16 1100 11/25/16 1234  BP: 92/71 97/62 (!) 93/59   Pulse: 92 97 93   Resp: (!) 23 18 (!) 21   Temp:   97.6 F (36.4 C)   TempSrc:   Oral   SpO2: 96% 97% 95%   Weight:    94.8 kg (209 lb)  Height:        Inpatient medications: . atorvastatin  20 mg Oral Daily  . bromocriptine  5 mg Oral QHS  . calcium acetate  667 mg Oral TID WC  . cinacalcet  30 mg Oral QPM  . dorzolamide-timolol  1 drop Both Eyes BID  . loratadine  10 mg Oral Daily  . mouth rinse  15 mL Mouth Rinse BID  . metoprolol tartrate  25 mg Oral BID  . midodrine  15 mg Oral Q M,W,F-HD  . pantoprazole  40 mg Oral Daily  . polyethylene glycol  17 g Oral Daily  . predniSONE  5 mg Oral Daily  . senna-docusate  1 tablet Oral BID  . sertraline  50 mg Oral Daily   . [START ON 11/26/2016] gentamicin    . vancomycin Stopped (11/25/16 0145)   acetaminophen, bisacodyl, morphine injection, MUSCLE RUB, ondansetron **OR** ondansetron (ZOFRAN) IV  Exam: NAD, elderly male No jvd Chest clear bilat RRR irreg irreg rhythm, soft murmur Abd soft ntnd no ascites Ext no edema Neuro is NF, Ox 3   Dialysis: [facility] MWF 4h   88.5kg   Hep 2800  LFA AVF -mircera 50 mg last on 8/15, last Hb 12 on 8/22      Impression: 1  ESRD  - HD tomorrow 2  Strep +bacteremia- w/u in progress 3  AFib w/ RVR  4  Chron hypotension - on midodrine 5  DM2 6  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/ mac degen 7  Depression on Zoloft 8  Hx TAVRS (June 2018) 9  SP PPM  Plan - HD Friday   Kelly Splinter MD [facility] pager [phone]   11/25/2016, 12:48 PM    Recent Labs Lab 11/24/16 0604 11/24/16 2326 11/25/16 0513  NA 136 134* 137  K 4.2 4.0 3.5  CL 98* 97* 100*  CO2 _0 GLUCOSE 174* 170* 121*  BUN 50* 68* 24*  CREATININE 8.15* 8.95* 4.09*  CALCIUM 7.9* 7.7* 8.0*  PHOS 4.1 4.4 3.5     Recent Labs Lab 11/23/16 1936 11/24/16 0604 11/24/16 2326 11/25/16 0513  AST 55*  --   --   --   ALT 37  --   --   --   ALKPHOS 70  --   --   --   BILITOT 1.2  --   --   --   PROT 6.9  --   --   --   ALBUMIN 3.0* 2.6* 2.5* 2.5*    Recent Labs Lab 11/24/16 0312 11/24/16 2326 11/25/16 0513  WBC 13.1* 13.6* 14.0*  NEUTROABS 11.5*  --  12.8*  HGB 10.0* 9.7* 9.9*  HCT 31.1* 29.9* 29.8*  MCV 96.6 95.2 94.9  PLT 103* 124* 133*   Iron/TIBC/Ferritin/ %Sat No results found for: IRON, TIBC, FERRITIN, IRONPCTSAT

## 2016-11-25 NOTE — Progress Notes (Signed)
Patient ID: Alexander [name], male   DOB: [birth year], 76 y.o.   MRN: [id number]          [facility]  Date of Admission:  11/23/2016           Day 2 vancomycin        Day 1 gentamicin ASSESSMENT: He has streptococcal bacteremia and is at risk for prosthetic aortic valve endocarditis and infection of his pacemaker leads. Also concerned about the possibility of cervical spine infection. I will continue vancomycin and gentamicin for now. Repeat blood cultures and TTE are pending.  PLAN: 1. Continue vancomycin and gentamicin 2. Review allergy history 3. Await results of repeat blood cultures and TTE 4. Consider cervical spine CT scan  Principal Problem:   Streptococcal bacteremia Active Problems:   S/P TAVR (transcatheter aortic valve replacement)   Presence of permanent cardiac pacemaker   Anemia, unspecified   Severe aortic stenosis   PAD (peripheral artery disease) (HCC)   ESRD (end stage renal disease) on dialysis (HCC)   Hyperlipidemia   Diabetic peripheral neuropathy (HCC)   Atrial fibrillation with RVR (HCC)   Glaucoma   GERD (gastroesophageal reflux disease)   Hypomagnesemia   Type 2 diabetes mellitus ([place])   Depression   SIRS (systemic inflammatory response syndrome) ([place])   . atorvastatin  20 mg Oral Daily  . bromocriptine  5 mg Oral QHS  . calcium acetate  667 mg Oral TID WC  . cinacalcet  30 mg Oral QPM  . dorzolamide-timolol  1 drop Both Eyes BID  . loratadine  10 mg Oral Daily  . mouth rinse  15 mL Mouth Rinse BID  . metoprolol tartrate  25 mg Oral BID  . midodrine  15 mg Oral Q M,W,F-HD  . pantoprazole  40 mg Oral Daily  . polyethylene glycol  17 g Oral Daily  . predniSONE  5 mg Oral Daily  . senna-docusate  1 tablet Oral BID  . sertraline  50 mg Oral Daily    SUBJECTIVE: He says he is feeling a little bit better today. He is still having severe neck pain that he rates 8-9 out of 10. That pain just started recently.  Review  of Systems: Review of Systems  Constitutional: Negative for chills, diaphoresis and fever.  Respiratory: Negative for cough.   Cardiovascular: Negative for chest pain.  Gastrointestinal: Negative for abdominal pain, diarrhea, nausea and vomiting.  Musculoskeletal: Positive for neck pain.  Neurological: Positive for headaches. Negative for speech change and focal weakness.    Allergies  Allergen Reactions  . Penicillins Swelling    SWELLING REACTION UNSPECIFIED   PATIENT HAD A PCN REACTION WITH IMMEDIATE RASH, FACIAL/TONGUE/THROAT SWELLING, SOB, OR LIGHTHEADEDNESS WITH HYPOTENSION:  #  #  #  YES  #  #  #   Has patient had a PCN reaction causing severe rash involving mucus membranes or skin necrosis: No Has patient had a PCN reaction that required hospitalization No Has patient had a PCN reaction occurring within the last 10 years: No If all of the above answers are "NO", then may proceed with Cephalosporin use.  . Codeine Nausea Only  . Tramadol Nausea Only    OBJECTIVE: Vitals:   11/25/16 0900 11/25/16 1000 11/25/16 1100 11/25/16 1234  BP: 92/71 97/62 (!) 93/59   Pulse: 92 97 93   Resp: (!) 23 18 (!) 21   Temp:   97.6 F (36.4 C)   TempSrc:   Oral  SpO2: 96% 97% 95%   Weight:    209 lb (94.8 kg)  Height:       Body mass index is 29.99 kg/m.  Physical Exam  Constitutional: He is oriented to person, place, and time.  He is more alert today.  Neck:  Pain with movement of his neck.  Cardiovascular: Normal rate.   No murmur heard. Irregularly irregular rhythm  Pulmonary/Chest: Effort normal and breath sounds normal.  Pacemaker site looks normal.  Abdominal: There is no tenderness.  Musculoskeletal: Normal range of motion. He exhibits no edema or tenderness.  Neurological: He is alert and oriented to person, place, and time.  Skin: No rash noted.  Psychiatric: Mood and affect normal.    Lab Results Lab Results  Component Value Date   WBC 14.0 (H) 11/25/2016    HGB 9.9 (L) 11/25/2016   HCT 29.8 (L) 11/25/2016   MCV 94.9 11/25/2016   PLT 133 (L) 11/25/2016    Lab Results  Component Value Date   CREATININE 4.09 (H) 11/25/2016   BUN 24 (H) 11/25/2016   NA 137 11/25/2016   K 3.5 11/25/2016   CL 100 (L) 11/25/2016   CO2 24 11/25/2016    Lab Results  Component Value Date   ALT 37 11/23/2016   AST 55 (H) 11/23/2016   ALKPHOS 70 11/23/2016   BILITOT 1.2 11/23/2016     Microbiology: Recent Results (from the past 240 hour(s))  Blood Culture (routine x 2)     Status: None (Preliminary result)   Collection Time: 11/23/16  8:15 PM  Result Value Ref Range Status   Specimen Description BLOOD RIGHT ANTECUBITAL  Final   Special Requests   Final    BOTTLES DRAWN AEROBIC AND ANAEROBIC Blood Culture adequate volume   Culture  Setup Time   Final    GRAM POSITIVE COCCI IN PAIRS AND CHAINS IN BOTH AEROBIC AND ANAEROBIC BOTTLES CRITICAL VALUE NOTED.  VALUE IS CONSISTENT WITH PREVIOUSLY REPORTED AND CALLED VALUE.    Culture GRAM POSITIVE COCCI IN PAIRS AND CHAINS  Final   Report Status PENDING  Incomplete  Blood Culture (routine x 2)     Status: None (Preliminary result)   Collection Time: 11/23/16  8:22 PM  Result Value Ref Range Status   Specimen Description BLOOD RIGHT WRIST  Final   Special Requests   Final    BOTTLES DRAWN AEROBIC AND ANAEROBIC Blood Culture adequate volume   Culture  Setup Time   Final    GRAM POSITIVE COCCI IN PAIRS AND CHAINS IN BOTH AEROBIC AND ANAEROBIC BOTTLES CRITICAL RESULT CALLED TO, READ BACK BY AND VERIFIED WITH: Truett Perna.D. 10:35 11/24/16 ([email or web address])    Culture GRAM POSITIVE COCCI IN PAIRS AND CHAINS  Final   Report Status PENDING  Incomplete  Blood Culture ID Panel (Reflexed)     Status: Abnormal   Collection Time: 11/23/16  8:22 PM  Result Value Ref Range Status   Enterococcus species NOT DETECTED NOT DETECTED Final   Listeria monocytogenes NOT DETECTED NOT DETECTED Final   Staphylococcus species NOT  DETECTED NOT DETECTED Final   Staphylococcus aureus NOT DETECTED NOT DETECTED Final   Streptococcus species DETECTED (A) NOT DETECTED Final    Comment: Not Enterococcus species, Streptococcus agalactiae, Streptococcus pyogenes, or Streptococcus pneumoniae. CRITICAL RESULT CALLED TO, READ BACK BY AND VERIFIED WITH: M. Radford Pax Pharm.D. 10:35 11/24/16 ([email or web address])    Streptococcus agalactiae NOT DETECTED NOT DETECTED Final   Streptococcus pneumoniae NOT DETECTED NOT DETECTED Final  Streptococcus pyogenes NOT DETECTED NOT DETECTED Final   Acinetobacter baumannii NOT DETECTED NOT DETECTED Final   Enterobacteriaceae species NOT DETECTED NOT DETECTED Final   Enterobacter cloacae complex NOT DETECTED NOT DETECTED Final   Escherichia coli NOT DETECTED NOT DETECTED Final   Klebsiella oxytoca NOT DETECTED NOT DETECTED Final   Klebsiella pneumoniae NOT DETECTED NOT DETECTED Final   Proteus species NOT DETECTED NOT DETECTED Final   Serratia marcescens NOT DETECTED NOT DETECTED Final   Haemophilus influenzae NOT DETECTED NOT DETECTED Final   Neisseria meningitidis NOT DETECTED NOT DETECTED Final   Pseudomonas aeruginosa NOT DETECTED NOT DETECTED Final   Candida albicans NOT DETECTED NOT DETECTED Final   Candida glabrata NOT DETECTED NOT DETECTED Final   Candida krusei NOT DETECTED NOT DETECTED Final   Candida parapsilosis NOT DETECTED NOT DETECTED Final   Candida tropicalis NOT DETECTED NOT DETECTED Final  MRSA PCR Screening     Status: None   Collection Time: 11/24/16  3:25 AM  Result Value Ref Range Status   MRSA by PCR NEGATIVE NEGATIVE Final    Comment:        The GeneXpert MRSA Assay (FDA approved for NASAL specimens only), is one component of a comprehensive MRSA colonization surveillance program. It is not intended to diagnose MRSA infection nor to guide or monitor treatment for MRSA infections.     Michel Bickers, MD [facility] [place]  Group [phone] pager   [phone] cell 11/25/2016, 1:07 PM

## 2016-11-25 NOTE — Progress Notes (Signed)
HD tx completed @ 0145 w/ low bp throughout tx but asymptomatic, Dr. Hollie Salk was on the unit for a little while during his tx and I made her aware he was staying in the 80's mostly despite giving an additional dose of midodrine but was asymptomatic and she said ok to keep UF on. After she left he did hit a sbp >100 a couple of times but it would go back down. UF goal met, blood rinsed back, report called to Fabienne Bruns, RN

## 2016-11-25 NOTE — Progress Notes (Signed)
  Echocardiogram 2D Echocardiogram has been performed.  Alexander [name] 11/25/2016, 4:44 PM

## 2016-11-25 NOTE — Patient Outreach (Signed)
[facility]) Care Management [place] Coordination  11/25/2016  [name] 07-11-1940 [id number]  Received voicemail message from patient's daughter/ caregiver, Alexander [name], on [facility] CM written consent re: Alexander [name],76 y.o.malereferred to [place] from [facility] telephonic RN CM on original MD referral. Patient has history including, but not limited to, PAD, Aortic stenosis with TAVR 08/31/16, ESRD on HD (M, W, F), A-Fib on coumadin, DM, CVA in 2004, CHF, macular degeneration with legal blindness, and multiple falls.  Patient presented to hospital 11/23/16 for chest pain and general malaise, and was admitted for further workup.  In her voicemail message, Alexander [name] shared that patient was currently in the hospital and that she may need to cancel next week's previously scheduled [facility] CM routine home visit at patient's home.  I returned [name]'s call within 10 minutes of receiving her voice mail message, and left a HIPAA compliant voice mail message for her, confirming that I had received her voice mail message from earlier today.     Plan:  [facility] RN CM will follow patient's progress while hospitalizedand collaborate with with family as indicated.  [name], RN, BSN, [employer] [facility] Care Management  [phone]

## 2016-11-26 ENCOUNTER — Inpatient Hospital Stay (HOSPITAL_COMMUNITY): Payer: Medicare Other

## 2016-11-26 DIAGNOSIS — Z992 Dependence on renal dialysis: Secondary | ICD-10-CM | POA: Diagnosis not present

## 2016-11-26 DIAGNOSIS — E1129 Type 2 diabetes mellitus with other diabetic kidney complication: Secondary | ICD-10-CM | POA: Diagnosis not present

## 2016-11-26 DIAGNOSIS — N186 End stage renal disease: Secondary | ICD-10-CM | POA: Diagnosis not present

## 2016-11-26 LAB — CBC WITH DIFFERENTIAL/PLATELET
BASOS ABS: 0 10*3/uL (ref 0.0–0.1)
BASOS PCT: 0 %
EOS ABS: 0 10*3/uL (ref 0.0–0.7)
EOS PCT: 0 %
HCT: 31.3 % — ABNORMAL LOW (ref 39.0–52.0)
Hemoglobin: 10.1 g/dL — ABNORMAL LOW (ref 13.0–17.0)
Lymphocytes Relative: 6 %
Lymphs Abs: 0.7 10*3/uL (ref 0.7–4.0)
MCH: 30.6 pg (ref 26.0–34.0)
MCHC: 32.3 g/dL (ref 30.0–36.0)
MCV: 94.8 fL (ref 78.0–100.0)
MONO ABS: 0.6 10*3/uL (ref 0.1–1.0)
Monocytes Relative: 5 %
Neutro Abs: 11.6 10*3/uL — ABNORMAL HIGH (ref 1.7–7.7)
Neutrophils Relative %: 89 %
PLATELETS: 166 10*3/uL (ref 150–400)
RBC: 3.3 MIL/uL — ABNORMAL LOW (ref 4.22–5.81)
RDW: 17.6 % — AB (ref 11.5–15.5)
WBC: 12.9 10*3/uL — AB (ref 4.0–10.5)

## 2016-11-26 LAB — CULTURE, BLOOD (ROUTINE X 2)
SPECIAL REQUESTS: ADEQUATE
Special Requests: ADEQUATE

## 2016-11-26 LAB — GLUCOSE, CAPILLARY
GLUCOSE-CAPILLARY: 116 mg/dL — AB (ref 65–99)
GLUCOSE-CAPILLARY: 117 mg/dL — AB (ref 65–99)
Glucose-Capillary: 127 mg/dL — ABNORMAL HIGH (ref 65–99)

## 2016-11-26 LAB — PROTIME-INR
INR: 6.38 — AB
PROTHROMBIN TIME: 55.7 s — AB (ref 11.4–15.2)

## 2016-11-26 LAB — RENAL FUNCTION PANEL
Albumin: 2.4 g/dL — ABNORMAL LOW (ref 3.5–5.0)
Anion gap: 14 (ref 5–15)
BUN: 45 mg/dL — AB (ref 6–20)
CHLORIDE: 96 mmol/L — AB (ref 101–111)
CO2: 26 mmol/L (ref 22–32)
CREATININE: 5.77 mg/dL — AB (ref 0.61–1.24)
Calcium: 8.2 mg/dL — ABNORMAL LOW (ref 8.9–10.3)
GFR calc Af Amer: 10 mL/min — ABNORMAL LOW (ref >60)
GFR, EST NON AFRICAN AMERICAN: 9 mL/min — AB (ref >60)
GLUCOSE: 140 mg/dL — AB (ref 65–99)
POTASSIUM: 3.7 mmol/L (ref 3.5–5.1)
Phosphorus: 3.5 mg/dL (ref 2.5–4.6)
Sodium: 136 mmol/L (ref 135–145)

## 2016-11-26 MED ORDER — HEPARIN SODIUM (PORCINE) 1000 UNIT/ML DIALYSIS: 1000.0000 [IU] | INTRAMUSCULAR | Status: DC | PRN

## 2016-11-26 MED ORDER — METHOCARBAMOL 1000 MG/10ML IJ SOLN
500.0000 mg | Freq: Four times a day (QID) | INTRAVENOUS | Status: DC | PRN
  Administered 2016-11-26 – 2016-12-01 (×4): 500 mg via INTRAVENOUS
  Filled 2016-11-26 (×6): qty 5
  Filled 2016-11-26: qty 550

## 2016-11-26 MED ORDER — PENTAFLUOROPROP-TETRAFLUOROETH EX AERO
1.0000 "application " | INHALATION_SPRAY | CUTANEOUS | Status: DC | PRN
  Filled 2016-11-26: qty 30

## 2016-11-26 MED ORDER — HEPARIN SODIUM (PORCINE) 1000 UNIT/ML DIALYSIS
2800.0000 [IU] | Freq: Once | INTRAMUSCULAR | Status: AC
  Administered 2016-11-26: 2800 [IU] via INTRAVENOUS_CENTRAL

## 2016-11-26 MED ORDER — ALTEPLASE 2 MG IJ SOLR: 2.0000 mg | Freq: Once | INTRAMUSCULAR | Status: DC | PRN

## 2016-11-26 MED ORDER — LIDOCAINE HCL (PF) 1 % IJ SOLN: 5.0000 mL | INTRAMUSCULAR | Status: DC | PRN

## 2016-11-26 MED ORDER — LIDOCAINE-PRILOCAINE 2.5-2.5 % EX CREA
1.0000 "application " | TOPICAL_CREAM | CUTANEOUS | Status: DC | PRN
  Filled 2016-11-26: qty 5

## 2016-11-26 MED ORDER — SODIUM CHLORIDE 0.9 % IV SOLN: 100.0000 mL | INTRAVENOUS | Status: DC | PRN

## 2016-11-26 MED ORDER — DEXTROSE 5 % IV SOLN
2.0000 g | Freq: Every day | INTRAVENOUS | Status: DC
  Administered 2016-11-26 – 2016-11-30 (×5): 2 g via INTRAVENOUS
  Filled 2016-11-26 (×7): qty 2

## 2016-11-26 NOTE — Progress Notes (Signed)
PROGRESS NOTE    Alexander [name]  BMW:[id number] DOB: 12/19/1940 DOA: 11/23/2016 PCP: Deland Pretty, MD    Brief Narrative:  Alexander [name] is a 76 y.o. male with medical history significant of anemia, aortic stenosis, PUD, GERD, Barrett's esophagus, carpal tunnel syndrome, paroxysmal atrial fibrillation, history of bradycardia (S/Pdual lead pacemaker placement), CVA, depression, type 2 diabetes, diabetic peripheral neuropathy, diverticulosis, ESRD on hemodialysis, glaucoma, hyperlipidemia, hypertension, nephrolithiasis, macular degeneration/legally blind, seasonal allergies, tubular adenoma of the colon who is coming to the emergency department generalized body aches and malaise, muscle spasms of all extremities since monday after he finished hemodialysis. He was admitted for evaluation of meningitis, to step down. Meanwhile he developed a fib with RVR and cardizem gtt was started.   On 8/ 29 afternoon, pt became encephalopathic after he received 2.5 mg of valium , became unresponsive and hypoxic. Dr Florene Glen ordered flumazenil and he reverted back to baseline. Valium was discontinued.  Blood cultures from both aerobic and anaerobic bottles are positive for streptococcus species.  ID Dr Megan Salon consulted for appropriate antibiotics.  8/31 neck pain persistent, PT eval ordered. Cardiology for TEE.   Assessment & Plan:   Principal Problem:   Streptococcal bacteremia Active Problems:   Anemia, unspecified   Severe aortic stenosis   PAD (peripheral artery disease) (HCC)   ESRD (end stage renal disease) on dialysis (HCC)   Hyperlipidemia   Diabetic peripheral neuropathy (HCC)   S/P TAVR (transcatheter aortic valve replacement)   Atrial fibrillation with RVR (HCC)   Glaucoma   GERD (gastroesophageal reflux disease)   Hypomagnesemia   Type 2 diabetes mellitus (HCC)   Depression   SIRS (systemic inflammatory response syndrome) (HCC)   Presence of permanent cardiac pacemaker   Generalized  malaise and mild rhabdomyolysis/ SIRS:  Suspected all this is due to strep bacteremia. He is alert and oriented and no meningeal signs except for headache and neck pain. Neck stiffness couldn't be elicited.  CT head without contrast showed old infarcts, CT neck shows spondylosis.  CT chest without contrast does not show any pneumonia.  Started on IV meropenem and vancomycin, changed to vancomycin and gentamicin.  Blood cultures showing strep species. ID consulted, recommended getting TTE and possible repeat CT cervical spine with contrast, discussed with DR Jonnie Finner,  Radiology and patient's daughter and ordered repeat CT cervical spine with contrast to evaluate for discitis and epidural abscess. It was negative for abscess.  Currently afebrile. Leukocytosis with left shift seen.  Continue with the same management as above.  Echocardiogram ordered and it did not show endocarditis. Cardiology will be requested for TEE.     Afib with RVR, pt has a h/o PAF, was on metoprolol , was on hold for hypotension.  Started on Cardizem gtt and tapered off as his rate is better.  Probably brought on by bacteremia and dehydration.  Still irregular, rate slightly higher today.    Elevated troponin's; From demand ischemia from afib with RVR.  Pt denies any chest pain.   ESRD on HD:  Nephrologist on board .    Anemia of chronic disease:  Hemoglobin around 9.9. Monitor.   TYPE 2 DM:  CBG (last 3)   Recent Labs  11/25/16 1702 11/25/16 1935 11/26/16 0759  GLUCAP 139* 146* 116*    Resume SSI. No change in medications.    H/o severe AS, s/p TAVR:   Was on coumadin at home, which is being held for supra therapeutic INR.   GERD:  Resume PPI.  HYPOTENSION; on midodrine.  Asymptomatic.   Leukocytosis: suspect its from the bacteremia.    DVT prophylaxis:heparin sq Code Status: (Full code) Family Communication: daughter at bedside.  Disposition Plan: pending further  evaluation..   Consultants:   Nephrology  ID  PCCM over the phone.   Procedures: HD  Antimicrobials: MEROPENAM till 8/29.  Gentamicin from 8/30 and vancomycin from admission.   Subjective: Severe headache, neck pain persistent.  Objective: Vitals:   11/26/16 1200 11/26/16 1230 11/26/16 1300 11/26/16 1330  BP: 107/72 101/67 95/67 109/69  Pulse: (!) 118 98 (!) 112 (!) 119  Resp: (!) 25 (!) 25 19 (!) 22  Temp:      TempSrc:      SpO2: 100% 98% 98% 99%  Weight:      Height:        Intake/Output Summary (Last 24 hours) at 11/26/16 1345 Last data filed at 11/26/16 0921  Gross per 24 hour  Intake              960 ml  Output                1 ml  Net              959 ml   Filed Weights   11/23/16 1531 11/25/16 1234  Weight: 88.5 kg (195 lb) 94.8 kg (209 lb)    Examination:  General exam:appears in mild distress from neck pain.  Respiratory system: diminished at bases, no wheezing or rhonchi. Air entry fair.  Cardiovascular system: S1 & S2 heard,irregular. No JVD, murmurs, rubs, gallops or clicks. No pedal edema. Gastrointestinal system: Abdomen is soft NT ND bs+ Central nervous system: Alert and oriented. No new deficits.  Extremities: Symmetric 5 x 5 power. Skin: No rashes, lesions or ulcers Psychiatry: Mood & affect appropriate.     Data Reviewed: I have personally reviewed following labs and imaging studies  CBC:  Recent Labs Lab 11/23/16 1546 11/24/16 0312 11/24/16 2326 11/25/16 0513 11/26/16 0138  WBC 13.5* 13.1* 13.6* 14.0* 12.9*  NEUTROABS  --  11.5*  --  12.8* 11.6*  HGB 11.1* 10.0* 9.7* 9.9* 10.1*  HCT 35.4* 31.1* 29.9* 29.8* 31.3*  MCV 98.6 96.6 95.2 94.9 94.8  PLT 123* 103* 124* 133* 702   Basic Metabolic Panel:  Recent Labs Lab 11/23/16 1546 11/23/16 1936 11/24/16 0604 11/24/16 2326 11/25/16 0513 11/26/16 0138  NA 136  --  136 134* 137 136  K 4.0  --  4.2 4.0 3.5 3.7  CL 95*  --  98* 97* 100* 96*  CO2 25  --  24 22 24 26    GLUCOSE 117*  --  174* 170* 121* 140*  BUN 40*  --  50* 68* 24* 45*  CREATININE 7.34*  --  8.15* 8.95* 4.09* 5.77*  CALCIUM 8.4*  --  7.9* 7.7* 8.0* 8.2*  MG  --  1.5*  --   --   --   --   PHOS  --   --  4.1 4.4 3.5 3.5   GFR: Estimated Creatinine Clearance: 12.7 mL/min (A) (by C-G formula based on SCr of 5.77 mg/dL (H)). Liver Function Tests:  Recent Labs Lab 11/23/16 1936 11/24/16 0604 11/24/16 2326 11/25/16 0513 11/26/16 0138  AST 55*  --   --   --   --   ALT 37  --   --   --   --   ALKPHOS 70  --   --   --   --  BILITOT 1.2  --   --   --   --   PROT 6.9  --   --   --   --   ALBUMIN 3.0* 2.6* 2.5* 2.5* 2.4*   No results for input(s): LIPASE, AMYLASE in the last 168 hours. No results for input(s): AMMONIA in the last 168 hours. Coagulation Profile:  Recent Labs Lab 11/23/16 1936 11/24/16 0312 11/25/16 0513 11/25/16 1018 11/26/16 0138  INR 3.08 3.72 6.72* 7.00* 6.38*   Cardiac Enzymes:  Recent Labs Lab 11/23/16 1936 11/24/16 0312 11/24/16 0604  CKTOTAL 630*  --   --   TROPONINI 0.09* 0.07* 0.07*   BNP (last 3 results)  Recent Labs  11/11/16 1143  PROBNP 18,028*   HbA1C: No results for input(s): HGBA1C in the last 72 hours. CBG:  Recent Labs Lab 11/25/16 0855 11/25/16 1218 11/25/16 1702 11/25/16 1935 11/26/16 0759  GLUCAP 111* 137* 139* 146* 116*   Lipid Profile: No results for input(s): CHOL, HDL, LDLCALC, TRIG, CHOLHDL, LDLDIRECT in the last 72 hours. Thyroid Function Tests: No results for input(s): TSH, T4TOTAL, FREET4, T3FREE, THYROIDAB in the last 72 hours. Anemia Panel: No results for input(s): VITAMINB12, FOLATE, FERRITIN, TIBC, IRON, RETICCTPCT in the last 72 hours. Sepsis Labs:  Recent Labs Lab 11/23/16 1951  LATICACIDVEN 1.23    Recent Results (from the past 240 hour(s))  Blood Culture (routine x 2)     Status: Abnormal   Collection Time: 11/23/16  8:15 PM  Result Value Ref Range Status   Specimen Description BLOOD  RIGHT ANTECUBITAL  Final   Special Requests   Final    BOTTLES DRAWN AEROBIC AND ANAEROBIC Blood Culture adequate volume   Culture  Setup Time   Final    GRAM POSITIVE COCCI IN PAIRS AND CHAINS IN BOTH AEROBIC AND ANAEROBIC BOTTLES CRITICAL VALUE NOTED.  VALUE IS CONSISTENT WITH PREVIOUSLY REPORTED AND CALLED VALUE.    Culture (A)  Final    STREPTOCOCCUS GALLOLYTICUS SUSCEPTIBILITIES PERFORMED ON PREVIOUS CULTURE WITHIN THE LAST 5 DAYS.    Report Status 11/26/2016 FINAL  Final  Blood Culture (routine x 2)     Status: Abnormal   Collection Time: 11/23/16  8:22 PM  Result Value Ref Range Status   Specimen Description BLOOD RIGHT WRIST  Final   Special Requests   Final    BOTTLES DRAWN AEROBIC AND ANAEROBIC Blood Culture adequate volume   Culture  Setup Time   Final    GRAM POSITIVE COCCI IN PAIRS AND CHAINS IN BOTH AEROBIC AND ANAEROBIC BOTTLES CRITICAL RESULT CALLED TO, READ BACK BY AND VERIFIED WITH: Truett Perna.D. 10:35 11/24/16 ([email or web address])    Culture STREPTOCOCCUS GALLOLYTICUS (A)  Final   Report Status 11/26/2016 FINAL  Final   Organism ID, Bacteria STREPTOCOCCUS GALLOLYTICUS  Final      Susceptibility   Streptococcus gallolyticus - MIC*    PENICILLIN 0.12 SENSITIVE Sensitive     CEFTRIAXONE <=0.12 SENSITIVE Sensitive     ERYTHROMYCIN INTERMEDIATE Intermediate     LEVOFLOXACIN 2 SENSITIVE Sensitive     VANCOMYCIN 0.5 SENSITIVE Sensitive     * STREPTOCOCCUS GALLOLYTICUS  Blood Culture ID Panel (Reflexed)     Status: Abnormal   Collection Time: 11/23/16  8:22 PM  Result Value Ref Range Status   Enterococcus species NOT DETECTED NOT DETECTED Final   Listeria monocytogenes NOT DETECTED NOT DETECTED Final   Staphylococcus species NOT DETECTED NOT DETECTED Final   Staphylococcus aureus NOT DETECTED NOT DETECTED Final  Streptococcus species DETECTED (A) NOT DETECTED Final    Comment: Not Enterococcus species, Streptococcus agalactiae, Streptococcus pyogenes, or Streptococcus  pneumoniae. CRITICAL RESULT CALLED TO, READ BACK BY AND VERIFIED WITH: M. Radford Pax Pharm.D. 10:35 11/24/16 ([email or web address])    Streptococcus agalactiae NOT DETECTED NOT DETECTED Final   Streptococcus pneumoniae NOT DETECTED NOT DETECTED Final   Streptococcus pyogenes NOT DETECTED NOT DETECTED Final   Acinetobacter baumannii NOT DETECTED NOT DETECTED Final   Enterobacteriaceae species NOT DETECTED NOT DETECTED Final   Enterobacter cloacae complex NOT DETECTED NOT DETECTED Final   Escherichia coli NOT DETECTED NOT DETECTED Final   Klebsiella oxytoca NOT DETECTED NOT DETECTED Final   Klebsiella pneumoniae NOT DETECTED NOT DETECTED Final   Proteus species NOT DETECTED NOT DETECTED Final   Serratia marcescens NOT DETECTED NOT DETECTED Final   Haemophilus influenzae NOT DETECTED NOT DETECTED Final   Neisseria meningitidis NOT DETECTED NOT DETECTED Final   Pseudomonas aeruginosa NOT DETECTED NOT DETECTED Final   Candida albicans NOT DETECTED NOT DETECTED Final   Candida glabrata NOT DETECTED NOT DETECTED Final   Candida krusei NOT DETECTED NOT DETECTED Final   Candida parapsilosis NOT DETECTED NOT DETECTED Final   Candida tropicalis NOT DETECTED NOT DETECTED Final  MRSA PCR Screening     Status: None   Collection Time: 11/24/16  3:25 AM  Result Value Ref Range Status   MRSA by PCR NEGATIVE NEGATIVE Final    Comment:        The GeneXpert MRSA Assay (FDA approved for NASAL specimens only), is one component of a comprehensive MRSA colonization surveillance program. It is not intended to diagnose MRSA infection nor to guide or monitor treatment for MRSA infections.          Radiology Studies: Ct Cervical Spine W Contrast  Result Date: 11/26/2016 CLINICAL DATA:  Neck pain ,infection suspected EXAM: CT CERVICAL SPINE WITH CONTRAST TECHNIQUE: Multidetector CT imaging of the cervical spine was performed during intravenous contrast administration. Multiplanar CT image reconstructions were also  generated. CONTRAST:  22mL ISOVUE-300 IOPAMIDOL (ISOVUE-300) INJECTION 61%, Cleared by for dr Jonnie Finner COMPARISON:  11/23/2016 FINDINGS: Alignment: Chronic anterolisthesis of C3 on C4, up to 13mm. Reversal of the normal cervical lordosis is again noted. Skull base and vertebrae: Negative for acute fracture. Osteoarthritis of the atlantooccipital articulation. Partially calcified pannus around the atlantoaxial articulation. Soft tissues and spinal canal: No prevertebral fluid or swelling. No visible canal hematoma.  No areas of unexpected enhancement. Disc levels: Mild disc space narrowing C2-3 and C3-4 with marked disc space narrowing C4 through C7. Small posterior marginal osteophytes and uncovertebral joint spurring is noted from C4 through C7. Multilevel degenerative facet arthropathy is seen as Before, left worse than right. Upper chest: Right subclavian transvenous pacemaker, incompletely visualized. Bilateral pleural effusions, new since prior study. Mediastinal lymph nodes measured up to 12 mm short axis diameter tunneled possibly reactive but nonspecific. Other: Moderate bilateral extracranial carotid arteriosclerosis. High-grade occlusive disease at the left carotid bifurcation extending through the ICA. Heavily calcified plaque in the proximal right ICA resulting in at least moderate short segment stenosis. Encephalomalacia in the left parieto-occipital and inferior left temporal lobe as before. IMPRESSION: 1. Negative for abscess or other unexpected enhancement. 2. Stable multilevel cervical spondylitic changes. 3. New bilateral pleural effusions . 4. Extensive bilateral extracranial carotid arterial disease. Electronically Signed   By: Lucrezia Europe M.D.   On: 11/26/2016 08:35        Scheduled Meds: . atorvastatin  20 mg Oral Daily  .  bromocriptine  5 mg Oral QHS  . calcium acetate  667 mg Oral TID WC  . cinacalcet  30 mg Oral QPM  . dorzolamide-timolol  1 drop Both Eyes BID  . loratadine  10 mg  Oral Daily  . mouth rinse  15 mL Mouth Rinse BID  . metoprolol tartrate  25 mg Oral BID  . midodrine  15 mg Oral Q M,W,F-HD  . pantoprazole  40 mg Oral Daily  . polyethylene glycol  17 g Oral Daily  . predniSONE  5 mg Oral Daily  . senna-docusate  1 tablet Oral BID  . sertraline  50 mg Oral Daily   Continuous Infusions: . sodium chloride    . sodium chloride    . gentamicin    . vancomycin Stopped (11/25/16 0145)     LOS: 3 days    Time spent: 35 minutes.    Hosie Poisson, MD [facility] Pager [phone]   If 7PM-7AM, please contact night-coverage [email or web address] Password [email or web address] 11/26/2016, 1:45 PM

## 2016-11-26 NOTE — Progress Notes (Signed)
Changed bed to a new low bed due to scale not functionning.

## 2016-11-26 NOTE — Progress Notes (Signed)
Patient ID: Alexander [name], male   DOB: Aug 20, 1940, 76 y.o.   MRN: [id number]          [facility]  Date of Admission:  11/23/2016           Day 3 vancomycin        Day 2 gentamicin ASSESSMENT: He has streptococcal bacteremia and is at risk for prosthetic aortic valve endocarditis and infection of his pacemaker leads. His strep gallolyticus is penicillin susceptible. He has tolerated cephalosporins well in the past. I will change vancomycin to ceftriaxone and continue gentamicin for now. I feel he needs a transesophageal echocardiogram to evaluate his artificial valve and pacemaker wires. There was no evidence of cervical spine infection seen on his CT scan but early infection can be missed.  PLAN: 1. Change vancomycin to ceftriaxone and continue with gentamicin 2. Await results of repeat blood; would hold off on PICC placement until we know blood cultures are negative 3. Recommend transesophageal echocardiogram 4. Please call Dr. Talbot Grumbling [phone]) for any infectious disease questions this weekend  Principal Problem:   Streptococcal bacteremia Active Problems:   S/P TAVR (transcatheter aortic valve replacement)   Presence of permanent cardiac pacemaker   Anemia, unspecified   Severe aortic stenosis   PAD (peripheral artery disease) (HCC)   ESRD (end stage renal disease) on dialysis ([place])   Hyperlipidemia   Diabetic peripheral neuropathy (HCC)   Atrial fibrillation with RVR (HCC)   Glaucoma   GERD (gastroesophageal reflux disease)   Hypomagnesemia   Type 2 diabetes mellitus ([place])   Depression   SIRS (systemic inflammatory response syndrome) ([place])   . atorvastatin  20 mg Oral Daily  . bromocriptine  5 mg Oral QHS  . calcium acetate  667 mg Oral TID WC  . cinacalcet  30 mg Oral QPM  . dorzolamide-timolol  1 drop Both Eyes BID  . loratadine  10 mg Oral Daily  . mouth rinse  15 mL Mouth Rinse BID  . metoprolol tartrate  25 mg Oral BID  .  midodrine  15 mg Oral Q M,W,F-HD  . pantoprazole  40 mg Oral Daily  . polyethylene glycol  17 g Oral Daily  . predniSONE  5 mg Oral Daily  . senna-docusate  1 tablet Oral BID  . sertraline  50 mg Oral Daily    SUBJECTIVE: He is feeling a little bit better today But still having severe neck pain and headache.  Review of Systems: Review of Systems  Constitutional: Negative for chills, diaphoresis and fever.  Respiratory: Negative for cough.   Cardiovascular: Negative for chest pain.  Gastrointestinal: Negative for abdominal pain, diarrhea, nausea and vomiting.  Musculoskeletal: Positive for neck pain.  Neurological: Positive for headaches. Negative for speech change and focal weakness.    Allergies  Allergen Reactions  . Penicillins Swelling    SWELLING REACTION UNSPECIFIED   PATIENT HAD A PCN REACTION WITH IMMEDIATE RASH, FACIAL/TONGUE/THROAT SWELLING, SOB, OR LIGHTHEADEDNESS WITH HYPOTENSION:  #  #  #  YES  #  #  #   Has patient had a PCN reaction causing severe rash involving mucus membranes or skin necrosis: No Has patient had a PCN reaction that required hospitalization No Has patient had a PCN reaction occurring within the last 10 years: No If all of the above answers are "NO", then may proceed with Cephalosporin use.  . Codeine Nausea Only  . Tramadol Nausea Only    OBJECTIVE: Vitals:  11/26/16 1300 11/26/16 1330 11/26/16 1333 11/26/16 1500  BP: 95/67 109/69 112/70 95/66  Pulse: (!) 112 (!) 119 (!) 116 (!) 111  Resp: 19 (!) 22 20 19   Temp:   (!) 97.4 F (36.3 C) 97.8 F (36.6 C)  TempSrc:   Oral Oral  SpO2: 98% 99% 99% 95%  Weight:      Height:       Body mass index is 29.56 kg/m.  Physical Exam  Constitutional: He is oriented to person, place, and time.  He is currently finishing up hemodialysis. He is in better spirits.  Neck:  Pain with movement of his neck.  Cardiovascular: Normal rate.   No murmur heard. Irregularly irregular rhythm    Pulmonary/Chest: Effort normal and breath sounds normal.  Pacemaker site looks normal.  Abdominal: There is no tenderness.  Musculoskeletal: Normal range of motion. He exhibits no edema or tenderness.  Neurological: He is alert and oriented to person, place, and time.  Skin: No rash noted.  Psychiatric: Mood and affect normal.    Lab Results Lab Results  Component Value Date   WBC 12.9 (H) 11/26/2016   HGB 10.1 (L) 11/26/2016   HCT 31.3 (L) 11/26/2016   MCV 94.8 11/26/2016   PLT 166 11/26/2016    Lab Results  Component Value Date   CREATININE 5.77 (H) 11/26/2016   BUN 45 (H) 11/26/2016   NA 136 11/26/2016   K 3.7 11/26/2016   CL 96 (L) 11/26/2016   CO2 26 11/26/2016    Lab Results  Component Value Date   ALT 37 11/23/2016   AST 55 (H) 11/23/2016   ALKPHOS 70 11/23/2016   BILITOT 1.2 11/23/2016     Microbiology: Recent Results (from the past 240 hour(s))  Blood Culture (routine x 2)     Status: Abnormal   Collection Time: 11/23/16  8:15 PM  Result Value Ref Range Status   Specimen Description BLOOD RIGHT ANTECUBITAL  Final   Special Requests   Final    BOTTLES DRAWN AEROBIC AND ANAEROBIC Blood Culture adequate volume   Culture  Setup Time   Final    GRAM POSITIVE COCCI IN PAIRS AND CHAINS IN BOTH AEROBIC AND ANAEROBIC BOTTLES CRITICAL VALUE NOTED.  VALUE IS CONSISTENT WITH PREVIOUSLY REPORTED AND CALLED VALUE.    Culture (A)  Final    STREPTOCOCCUS GALLOLYTICUS SUSCEPTIBILITIES PERFORMED ON PREVIOUS CULTURE WITHIN THE LAST 5 DAYS.    Report Status 11/26/2016 FINAL  Final  Blood Culture (routine x 2)     Status: Abnormal   Collection Time: 11/23/16  8:22 PM  Result Value Ref Range Status   Specimen Description BLOOD RIGHT WRIST  Final   Special Requests   Final    BOTTLES DRAWN AEROBIC AND ANAEROBIC Blood Culture adequate volume   Culture  Setup Time   Final    GRAM POSITIVE COCCI IN PAIRS AND CHAINS IN BOTH AEROBIC AND ANAEROBIC BOTTLES CRITICAL RESULT  CALLED TO, READ BACK BY AND VERIFIED WITH: Truett Perna.D. 10:35 11/24/16 ([email or web address])    Culture STREPTOCOCCUS GALLOLYTICUS (A)  Final   Report Status 11/26/2016 FINAL  Final   Organism ID, Bacteria STREPTOCOCCUS GALLOLYTICUS  Final      Susceptibility   Streptococcus gallolyticus - MIC*    PENICILLIN 0.12 SENSITIVE Sensitive     CEFTRIAXONE <=0.12 SENSITIVE Sensitive     ERYTHROMYCIN INTERMEDIATE Intermediate     LEVOFLOXACIN 2 SENSITIVE Sensitive     VANCOMYCIN 0.5 SENSITIVE Sensitive     *  STREPTOCOCCUS GALLOLYTICUS  Blood Culture ID Panel (Reflexed)     Status: Abnormal   Collection Time: 11/23/16  8:22 PM  Result Value Ref Range Status   Enterococcus species NOT DETECTED NOT DETECTED Final   Listeria monocytogenes NOT DETECTED NOT DETECTED Final   Staphylococcus species NOT DETECTED NOT DETECTED Final   Staphylococcus aureus NOT DETECTED NOT DETECTED Final   Streptococcus species DETECTED (A) NOT DETECTED Final    Comment: Not Enterococcus species, Streptococcus agalactiae, Streptococcus pyogenes, or Streptococcus pneumoniae. CRITICAL RESULT CALLED TO, READ BACK BY AND VERIFIED WITH: M. Radford Pax Pharm.D. 10:35 11/24/16 ([email or web address])    Streptococcus agalactiae NOT DETECTED NOT DETECTED Final   Streptococcus pneumoniae NOT DETECTED NOT DETECTED Final   Streptococcus pyogenes NOT DETECTED NOT DETECTED Final   Acinetobacter baumannii NOT DETECTED NOT DETECTED Final   Enterobacteriaceae species NOT DETECTED NOT DETECTED Final   Enterobacter cloacae complex NOT DETECTED NOT DETECTED Final   Escherichia coli NOT DETECTED NOT DETECTED Final   Klebsiella oxytoca NOT DETECTED NOT DETECTED Final   Klebsiella pneumoniae NOT DETECTED NOT DETECTED Final   Proteus species NOT DETECTED NOT DETECTED Final   Serratia marcescens NOT DETECTED NOT DETECTED Final   Haemophilus influenzae NOT DETECTED NOT DETECTED Final   Neisseria meningitidis NOT DETECTED NOT DETECTED Final   Pseudomonas aeruginosa  NOT DETECTED NOT DETECTED Final   Candida albicans NOT DETECTED NOT DETECTED Final   Candida glabrata NOT DETECTED NOT DETECTED Final   Candida krusei NOT DETECTED NOT DETECTED Final   Candida parapsilosis NOT DETECTED NOT DETECTED Final   Candida tropicalis NOT DETECTED NOT DETECTED Final  MRSA PCR Screening     Status: None   Collection Time: 11/24/16  3:25 AM  Result Value Ref Range Status   MRSA by PCR NEGATIVE NEGATIVE Final    Comment:        The GeneXpert MRSA Assay (FDA approved for NASAL specimens only), is one component of a comprehensive MRSA colonization surveillance program. It is not intended to diagnose MRSA infection nor to guide or monitor treatment for MRSA infections.     Michel Bickers, MD [facility] [place] [facility] [phone] pager   [phone] cell 11/26/2016, 3:18 PM

## 2016-11-26 NOTE — Progress Notes (Signed)
[facility] Progress Note  Subjective: pt no specific c/o's.    Vitals:   11/26/16 1100 11/26/16 1130 11/26/16 1200 11/26/16 1230  BP: (!) 97/52 94/64 107/72 101/67  Pulse: 93 (!) 104 (!) 118 98  Resp: (!) 23 18 (!) 25 (!) 25  Temp:      TempSrc:      SpO2: 97% 98% 100% 98%  Weight:      Height:        Inpatient medications: . atorvastatin  20 mg Oral Daily  . bromocriptine  5 mg Oral QHS  . calcium acetate  667 mg Oral TID WC  . cinacalcet  30 mg Oral QPM  . dorzolamide-timolol  1 drop Both Eyes BID  . loratadine  10 mg Oral Daily  . mouth rinse  15 mL Mouth Rinse BID  . metoprolol tartrate  25 mg Oral BID  . midodrine  15 mg Oral Q M,W,F-HD  . pantoprazole  40 mg Oral Daily  . polyethylene glycol  17 g Oral Daily  . predniSONE  5 mg Oral Daily  . senna-docusate  1 tablet Oral BID  . sertraline  50 mg Oral Daily   . sodium chloride    . sodium chloride    . gentamicin    . vancomycin Stopped (11/25/16 0145)   sodium chloride, sodium chloride, acetaminophen, alteplase, bisacodyl, bisacodyl, heparin, lidocaine (PF), lidocaine-prilocaine, morphine injection, MUSCLE RUB, ondansetron **OR** ondansetron (ZOFRAN) IV, pentafluoroprop-tetrafluoroeth  Exam: NAD, elderly male No jvd Chest clear bilat RRR irreg irreg rhythm, soft murmur Abd soft ntnd no ascites Ext no edema Neuro is NF, Ox 3   Dialysis: [facility] MWF 4h   88.5kg   Hep 2800  LFA AVF -mircera 50 mg last on 8/15, last Hb 12 on 8/22      Impression: 1  ESRD  - MWF HD 2  Strep +bacteremia- w/u in progress 3  AFib w/ RVR  4  Chron hypotension - on midodrine 5  DM2 6  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/ mac degen 7  Depression on Zoloft 8  Hx TAVRS (June 2018) 9  SP PPM  Plan - HD today , UF 2 L as Ronnie Derby MD [facility] pager [phone]   11/26/2016, 12:36 PM    Recent Labs Lab 11/24/16 2326 11/25/16 0513 11/26/16 0138  NA 134* 137 136  K 4.0 3.5 3.7  CL 97* 100* 96*   CO2 _0 GLUCOSE 170* 121* 140*  BUN 68* 24* 45*  CREATININE 8.95* 4.09* 5.77*  CALCIUM 7.7* 8.0* 8.2*  PHOS 4.4 3.5 3.5    Recent Labs Lab 11/23/16 1936  11/24/16 2326 11/25/16 0513 11/26/16 0138  AST 55*  --   --   --   --   ALT 37  --   --   --   --   ALKPHOS 70  --   --   --   --   BILITOT 1.2  --   --   --   --   PROT 6.9  --   --   --   --   ALBUMIN 3.0*  < > 2.5* 2.5* 2.4*  < > = values in this interval not displayed.  Recent Labs Lab 11/24/16 0312 11/24/16 2326 11/25/16 0513 11/26/16 0138  WBC 13.1* 13.6* 14.0* 12.9*  NEUTROABS 11.5*  --  12.8* 11.6*  HGB 10.0* 9.7* 9.9* 10.1*  HCT 31.1* 29.9* 29.8* 31.3*  MCV 96.6 95.2  94.9 94.8  PLT 103* 124* 133* 166   Iron/TIBC/Ferritin/ %Sat No results found for: IRON, TIBC, FERRITIN, IRONPCTSAT

## 2016-11-27 ENCOUNTER — Inpatient Hospital Stay (HOSPITAL_COMMUNITY): Payer: Medicare Other

## 2016-11-27 LAB — GLUCOSE, CAPILLARY
Glucose-Capillary: 131 mg/dL — ABNORMAL HIGH (ref 65–99)
Glucose-Capillary: 126 mg/dL — ABNORMAL HIGH (ref 65–99)
Glucose-Capillary: 146 mg/dL — ABNORMAL HIGH (ref 65–99)
Glucose-Capillary: 128 mg/dL — ABNORMAL HIGH (ref 65–99)

## 2016-11-27 LAB — PROTIME-INR
INR: 5.47 — AB
PROTHROMBIN TIME: 49.4 s — AB (ref 11.4–15.2)

## 2016-11-27 MED ORDER — METOPROLOL TARTRATE 50 MG PO TABS
50.0000 mg | ORAL_TABLET | Freq: Two times a day (BID) | ORAL | Status: DC
  Administered 2016-11-27 – 2016-12-02 (×8): 50 mg via ORAL
  Filled 2016-11-27 (×9): qty 1

## 2016-11-27 MED ORDER — KETOROLAC TROMETHAMINE 15 MG/ML IJ SOLN
15.0000 mg | Freq: Three times a day (TID) | INTRAMUSCULAR | Status: AC | PRN
  Administered 2016-11-27 – 2016-11-30 (×3): 15 mg via INTRAVENOUS
  Filled 2016-11-27 (×3): qty 1

## 2016-11-27 NOTE — Progress Notes (Signed)
[facility] Progress Note  Subjective: neck still hurts, no infection on CT spine  Vitals:   11/26/16 2011 11/26/16 2301 11/27/16 0403 11/27/16 0732  BP: 110/83 104/66 103/75 117/78  Pulse: (!) 103 (!) 112 (!) 108 (!) 114  Resp: (!) 23 (!) 23 (!) 23 (!) 26  Temp: 97.9 F (36.6 C) 98.6 F (37 C) 98.3 F (36.8 C) 97.9 F (36.6 C)  TempSrc: Oral Oral Oral Oral  SpO2: 95% 93% 96% 95%  Weight:      Height:        Inpatient medications: . atorvastatin  20 mg Oral Daily  . bromocriptine  5 mg Oral QHS  . calcium acetate  667 mg Oral TID WC  . cinacalcet  30 mg Oral QPM  . dorzolamide-timolol  1 drop Both Eyes BID  . loratadine  10 mg Oral Daily  . mouth rinse  15 mL Mouth Rinse BID  . metoprolol tartrate  25 mg Oral BID  . midodrine  15 mg Oral Q M,W,F-HD  . pantoprazole  40 mg Oral Daily  . polyethylene glycol  17 g Oral Daily  . predniSONE  5 mg Oral Daily  . senna-docusate  1 tablet Oral BID  . sertraline  50 mg Oral Daily   . cefTRIAXone (ROCEPHIN)  IV Stopped (11/26/16 1733)  . gentamicin Stopped (11/26/16 1522)  . methocarbamol (ROBAXIN)  IV Stopped (11/27/16 0317)   acetaminophen, bisacodyl, bisacodyl, methocarbamol (ROBAXIN)  IV, morphine injection, MUSCLE RUB, ondansetron **OR** ondansetron (ZOFRAN) IV  Exam: NAD, elderly male No jvd Chest clear bilat RRR irreg irreg rhythm, soft murmur Abd soft ntnd no ascites Ext no edema Neuro is NF, Ox 3   Dialysis: [facility] M[facility] 4h   88.5kg   Hep 2800  LFA AVF -mircera 50 mg last on 8/15, last Hb 12 on 8/22      Impression: 1  ESRD  - HD MWF 2  Strep +bacteremia- w/u in progress, for TEE next week 3  AFib w/ RVR  4  Chron hypotension - on midodrine 15 pre HD tiw 5  DM2 6  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/ mac degen 7  Depression on Zoloft 8  SP TAVRS (June 2018) 9  SP PPM  Plan - HD Monday   Kelly Splinter MD [facility] pager [phone]   11/27/2016, 10:35 AM    Recent Labs Lab  11/24/16 2326 11/25/16 0513 11/26/16 0138  NA 134* 137 136  K 4.0 3.5 3.7  CL 97* 100* 96*  CO2 _0 GLUCOSE 170* 121* 140*  BUN 68* 24* 45*  CREATININE 8.95* 4.09* 5.77*  CALCIUM 7.7* 8.0* 8.2*  PHOS 4.4 3.5 3.5    Recent Labs Lab 11/23/16 1936  11/24/16 2326 11/25/16 0513 11/26/16 0138  AST 55*  --   --   --   --   ALT 37  --   --   --   --   ALKPHOS 70  --   --   --   --   BILITOT 1.2  --   --   --   --   PROT 6.9  --   --   --   --   ALBUMIN 3.0*  < > 2.5* 2.5* 2.4*  < > = values in this interval not displayed.  Recent Labs Lab 11/24/16 0312 11/24/16 2326 11/25/16 0513 11/26/16 0138  WBC 13.1* 13.6* 14.0* 12.9*  NEUTROABS 11.5*  --  12.8* 11.6*  HGB 10.0*  9.7* 9.9* 10.1*  HCT 31.1* 29.9* 29.8* 31.3*  MCV 96.6 95.2 94.9 94.8  PLT 103* 124* 133* 166   Iron/TIBC/Ferritin/ %Sat No results found for: IRON, TIBC, FERRITIN, IRONPCTSAT

## 2016-11-27 NOTE — Progress Notes (Signed)
PROGRESS NOTE    Alexander [name]  VQM:[id number] DOB: [birth year] DOA: 11/23/2016 PCP: Deland Pretty, MD    Brief Narrative:  Alexander [name] is a 76 y.o. male with medical history significant of anemia, aortic stenosis, PUD, GERD, Barrett's esophagus, carpal tunnel syndrome, paroxysmal atrial fibrillation, history of bradycardia (S/Pdual lead pacemaker placement), CVA, depression, type 2 diabetes, diabetic peripheral neuropathy, diverticulosis, ESRD on hemodialysis, glaucoma, hyperlipidemia, hypertension, nephrolithiasis, macular degeneration/legally blind, seasonal allergies, tubular adenoma of the colon who is coming to the emergency department generalized body aches and malaise, muscle spasms of all extremities since monday after he finished hemodialysis. He was admitted for evaluation of meningitis, to step down. Meanwhile he developed a fib with RVR and cardizem gtt was started.   On 8/ 29 afternoon, pt became encephalopathic after he received 2.5 mg of valium , became unresponsive and hypoxic. Dr Florene Glen ordered flumazenil and he reverted back to baseline. Valium was discontinued.  Blood cultures from both aerobic and anaerobic bottles are positive for streptococcus species.  ID Dr Megan Salon consulted for appropriate antibiotics.  8/31 neck pain persistent, PT eval ordered. Cardiology for TEE.  9/1 right hip pain, CT of the right hip ordered.   Assessment & Plan:   Principal Problem:   Streptococcal bacteremia Active Problems:   Anemia, unspecified   Severe aortic stenosis   PAD (peripheral artery disease) (HCC)   ESRD (end stage renal disease) on dialysis (HCC)   Hyperlipidemia   Diabetic peripheral neuropathy (HCC)   S/P TAVR (transcatheter aortic valve replacement)   Atrial fibrillation with RVR (HCC)   Glaucoma   GERD (gastroesophageal reflux disease)   Hypomagnesemia   Type 2 diabetes mellitus (HCC)   Depression   SIRS (systemic inflammatory response syndrome) (HCC)  Presence of permanent cardiac pacemaker   Generalized malaise and mild rhabdomyolysis/ SIRS:  Suspected all this is due to STREPTOCOCCUS GALLOLYTICUS bacteremia. He is alert and oriented and no meningeal signs except for headache and neck pain.  CT head without contrast showed old infarcts, repeat CT of the head was also negative for acute pathology, CT neck shows spondylosis.  CT chest without contrast does not show any pneumonia.  Started on IV meropenem and vancomycin, changed to rocephin and gentamicin.  Blood cultures showing strep species. ID consulted, recommended getting TTE and possible repeat CT cervical spine with contrast, discussed with DR Jonnie Finner,  Radiology and patient's daughter and ordered repeat CT cervical spine with contrast to evaluate for discitis and epidural abscess, which was negative for abscess.  Currently afebrile. Leukocytosis with left shift seen.  Continue with the same management as above.  Echocardiogram ordered and it did not show endocarditis. Cardiology for TEE, possibly next week after the holiday.     Afib with RVR, pt has a h/o PAF,: Rate not well controlled,suspect its from pain.  Was on metoprolol 25 mg BID, increased it to 50 mg BID.  Was on coumadin and his INR IS still supra therapeutic.  Pharmacy to dose coumadin when INR becomes therapeutic.    Elevated troponin's; From demand ischemia from afib with RVR.  Pt denies any chest pain.   ESRD on HD:  Nephrologist on board .    Anemia of chronic disease:  Hemoglobin around 10. Monitor.   TYPE 2 DM:  CBG (last 3)   Recent Labs  11/26/16 2131 11/27/16 0735 11/27/16 1122  GLUCAP 117* 131* 126*    Resume SSI. No change in medications.    H/o severe AS, s/p  TAVR:  Was on coumadin at home, which is being held for supra therapeutic INR.   GERD:  Resume PPI.    HYPOTENSION; on midodrine.  Asymptomatic.   Leukocytosis: suspect its from the bacteremia.   Right hip pain: CT of  the right hip ordered by ID.  - pain control with IV toradol.    DVT prophylaxis:heparin sq Code Status: (Full code) Family Communication: daughter and wife at bedside.  Disposition Plan: pending resolution of the neck pain.    Consultants:   Nephrology  ID  PCCM over the phone.   Procedures: HD  Antimicrobials: MEROPENAM till 8/29.  Gentamicin from 8/30 vancomycin from admission till 8/31 Rocephin from 8/31. .   Subjective: Headache improved.  No chest pain or sob.  Persistent neck pain.  Right hip pain.  Generalized malaise.  No fever overnight.   Objective: Vitals:   11/26/16 2301 11/27/16 0403 11/27/16 0732 11/27/16 1123  BP: 104/66 103/75 117/78 (!) 118/94  Pulse: (!) 112 (!) 108 (!) 114 (!) 112  Resp: (!) 23 (!) 23 (!) 26 19  Temp: 98.6 F (37 C) 98.3 F (36.8 C) 97.9 F (36.6 C) 98.4 F (36.9 C)  TempSrc: Oral Oral Oral Oral  SpO2: 93% 96% 95% 96%  Weight:      Height:        Intake/Output Summary (Last 24 hours) at 11/27/16 1238 Last data filed at 11/27/16 0900  Gross per 24 hour  Intake           991.75 ml  Output             2001 ml  Net         -1009.25 ml   Filed Weights   11/23/16 1531 11/25/16 1234  Weight: 88.5 kg (195 lb) 94.8 kg (209 lb)    Examination:  General exam:appears restless, reports did not have a good sleep. Respiratory system: diminished at bases, no wheezing or rhonchi. Air entry fair.  Cardiovascular system: S1 & S2 heard,irregular tachycardic.[name] No JVD, murmurs, rubs, gallops or clicks. No pedal edema. Gastrointestinal system: Abdomen soft non tender non distended bowel sounds heard.  Central nervous system: Alert and oriented. No new deficits.  Extremities: Symmetric 5 x 5 power. Skin: No rashes, lesions or ulcers Psychiatry: Mood & affect appropriate.     Data Reviewed: I have personally reviewed following labs and imaging studies  CBC:  Recent Labs Lab 11/23/16 1546 11/24/16 0312 11/24/16 2326  11/25/16 0513 11/26/16 0138  WBC 13.5* 13.1* 13.6* 14.0* 12.9*  NEUTROABS  --  11.5*  --  12.8* 11.6*  HGB 11.1* 10.0* 9.7* 9.9* 10.1*  HCT 35.4* 31.1* 29.9* 29.8* 31.3*  MCV 98.6 96.6 95.2 94.9 94.8  PLT 123* 103* 124* 133* 027   Basic Metabolic Panel:  Recent Labs Lab 11/23/16 1546 11/23/16 1936 11/24/16 0604 11/24/16 2326 11/25/16 0513 11/26/16 0138  NA 136  --  136 134* 137 136  K 4.0  --  4.2 4.0 3.5 3.7  CL 95*  --  98* 97* 100* 96*  CO2 25  --  24 22 24 26   GLUCOSE 117*  --  174* 170* 121* 140*  BUN 40*  --  50* 68* 24* 45*  CREATININE 7.34*  --  8.15* 8.95* 4.09* 5.77*  CALCIUM 8.4*  --  7.9* 7.7* 8.0* 8.2*  MG  --  1.5*  --   --   --   --   PHOS  --   --  4.1 4.4 3.5 3.5   GFR: Estimated Creatinine Clearance: 12.7 mL/min (A) (by C-G formula based on SCr of 5.77 mg/dL (H)). Liver Function Tests:  Recent Labs Lab 11/23/16 1936 11/24/16 0604 11/24/16 2326 11/25/16 0513 11/26/16 0138  AST 55*  --   --   --   --   ALT 37  --   --   --   --   ALKPHOS 70  --   --   --   --   BILITOT 1.2  --   --   --   --   PROT 6.9  --   --   --   --   ALBUMIN 3.0* 2.6* 2.5* 2.5* 2.4*   No results for input(s): LIPASE, AMYLASE in the last 168 hours. No results for input(s): AMMONIA in the last 168 hours. Coagulation Profile:  Recent Labs Lab 11/24/16 0312 11/25/16 0513 11/25/16 1018 11/26/16 0138 11/27/16 0227  INR 3.72 6.72* 7.00* 6.38* 5.47*   Cardiac Enzymes:  Recent Labs Lab 11/23/16 1936 11/24/16 0312 11/24/16 0604  CKTOTAL 630*  --   --   TROPONINI 0.09* 0.07* 0.07*   BNP (last 3 results)  Recent Labs  11/11/16 1143  PROBNP 18,028*   HbA1C: No results for input(s): HGBA1C in the last 72 hours. CBG:  Recent Labs Lab 11/26/16 0759 11/26/16 1656 11/26/16 2131 11/27/16 0735 11/27/16 1122  GLUCAP 116* 127* 117* 131* 126*   Lipid Profile: No results for input(s): CHOL, HDL, LDLCALC, TRIG, CHOLHDL, LDLDIRECT in the last 72 hours. Thyroid  Function Tests: No results for input(s): TSH, T4TOTAL, FREET4, T3FREE, THYROIDAB in the last 72 hours. Anemia Panel: No results for input(s): VITAMINB12, FOLATE, FERRITIN, TIBC, IRON, RETICCTPCT in the last 72 hours. Sepsis Labs:  Recent Labs Lab 11/23/16 1951  LATICACIDVEN 1.23    Recent Results (from the past 240 hour(s))  Blood Culture (routine x 2)     Status: Abnormal   Collection Time: 11/23/16  8:15 PM  Result Value Ref Range Status   Specimen Description BLOOD RIGHT ANTECUBITAL  Final   Special Requests   Final    BOTTLES DRAWN AEROBIC AND ANAEROBIC Blood Culture adequate volume   Culture  Setup Time   Final    GRAM POSITIVE COCCI IN PAIRS AND CHAINS IN BOTH AEROBIC AND ANAEROBIC BOTTLES CRITICAL VALUE NOTED.  VALUE IS CONSISTENT WITH PREVIOUSLY REPORTED AND CALLED VALUE.    Culture (A)  Final    STREPTOCOCCUS GALLOLYTICUS SUSCEPTIBILITIES PERFORMED ON PREVIOUS CULTURE WITHIN THE LAST 5 DAYS.    Report Status 11/26/2016 FINAL  Final  Blood Culture (routine x 2)     Status: Abnormal   Collection Time: 11/23/16  8:22 PM  Result Value Ref Range Status   Specimen Description BLOOD RIGHT WRIST  Final   Special Requests   Final    BOTTLES DRAWN AEROBIC AND ANAEROBIC Blood Culture adequate volume   Culture  Setup Time   Final    GRAM POSITIVE COCCI IN PAIRS AND CHAINS IN BOTH AEROBIC AND ANAEROBIC BOTTLES CRITICAL RESULT CALLED TO, READ BACK BY AND VERIFIED WITH: Truett Perna.D. 10:35 11/24/16 ([email or web address])    Culture STREPTOCOCCUS GALLOLYTICUS (A)  Final   Report Status 11/26/2016 FINAL  Final   Organism ID, Bacteria STREPTOCOCCUS GALLOLYTICUS  Final      Susceptibility   Streptococcus gallolyticus - MIC*    PENICILLIN 0.12 SENSITIVE Sensitive     CEFTRIAXONE <=0.12 SENSITIVE Sensitive     ERYTHROMYCIN INTERMEDIATE Intermediate  LEVOFLOXACIN 2 SENSITIVE Sensitive     VANCOMYCIN 0.5 SENSITIVE Sensitive     * STREPTOCOCCUS GALLOLYTICUS  Blood Culture ID Panel  (Reflexed)     Status: Abnormal   Collection Time: 11/23/16  8:22 PM  Result Value Ref Range Status   Enterococcus species NOT DETECTED NOT DETECTED Final   Listeria monocytogenes NOT DETECTED NOT DETECTED Final   Staphylococcus species NOT DETECTED NOT DETECTED Final   Staphylococcus aureus NOT DETECTED NOT DETECTED Final   Streptococcus species DETECTED (A) NOT DETECTED Final    Comment: Not Enterococcus species, Streptococcus agalactiae, Streptococcus pyogenes, or Streptococcus pneumoniae. CRITICAL RESULT CALLED TO, READ BACK BY AND VERIFIED WITH: M. Radford Pax Pharm.D. 10:35 11/24/16 ([email or web address])    Streptococcus agalactiae NOT DETECTED NOT DETECTED Final   Streptococcus pneumoniae NOT DETECTED NOT DETECTED Final   Streptococcus pyogenes NOT DETECTED NOT DETECTED Final   Acinetobacter baumannii NOT DETECTED NOT DETECTED Final   Enterobacteriaceae species NOT DETECTED NOT DETECTED Final   Enterobacter cloacae complex NOT DETECTED NOT DETECTED Final   Escherichia coli NOT DETECTED NOT DETECTED Final   Klebsiella oxytoca NOT DETECTED NOT DETECTED Final   Klebsiella pneumoniae NOT DETECTED NOT DETECTED Final   Proteus species NOT DETECTED NOT DETECTED Final   Serratia marcescens NOT DETECTED NOT DETECTED Final   Haemophilus influenzae NOT DETECTED NOT DETECTED Final   Neisseria meningitidis NOT DETECTED NOT DETECTED Final   Pseudomonas aeruginosa NOT DETECTED NOT DETECTED Final   Candida albicans NOT DETECTED NOT DETECTED Final   Candida glabrata NOT DETECTED NOT DETECTED Final   Candida krusei NOT DETECTED NOT DETECTED Final   Candida parapsilosis NOT DETECTED NOT DETECTED Final   Candida tropicalis NOT DETECTED NOT DETECTED Final  MRSA PCR Screening     Status: None   Collection Time: 11/24/16  3:25 AM  Result Value Ref Range Status   MRSA by PCR NEGATIVE NEGATIVE Final    Comment:        The GeneXpert MRSA Assay (FDA approved for NASAL specimens only), is one component of  a comprehensive MRSA colonization surveillance program. It is not intended to diagnose MRSA infection nor to guide or monitor treatment for MRSA infections.   Culture, blood (routine x 2)     Status: None (Preliminary result)   Collection Time: 11/25/16 10:18 AM  Result Value Ref Range Status   Specimen Description BLOOD RIGHT WRIST  Final   Special Requests   Final    BOTTLES DRAWN AEROBIC AND ANAEROBIC Blood Culture adequate volume   Culture NO GROWTH 2 DAYS  Final   Report Status PENDING  Incomplete  Culture, blood (routine x 2)     Status: None (Preliminary result)   Collection Time: 11/25/16 10:18 AM  Result Value Ref Range Status   Specimen Description BLOOD RIGHT HAND  Final   Special Requests   Final    BOTTLES DRAWN AEROBIC AND ANAEROBIC Blood Culture adequate volume   Culture NO GROWTH 2 DAYS  Final   Report Status PENDING  Incomplete         Radiology Studies: Ct Head Wo Contrast  Result Date: 11/26/2016 CLINICAL DATA:  Initial evaluation for acute severe headache. Supratherapeutic I NR. EXAM: CT HEAD WITHOUT CONTRAST TECHNIQUE: Contiguous axial images were obtained from the base of the skull through the vertex without intravenous contrast. COMPARISON:  Prior CT from 11/23/2016. FINDINGS: Brain: Generalized age related cerebral atrophy with chronic small vessel ischemic disease. Large remote left PCA territory infarct, stable. No acute intracranial  hemorrhage. No evidence for acute large vessel territory infarct. No mass lesion, midline shift or mass effect. No hydrocephalus. No extra-axial fluid collection. Vascular: No hyperdense vessel. Scattered vascular calcifications noted within the carotid siphons. Skull: Scalp soft tissues and calvarium within normal limits. Sinuses/Orbits: Globes and orbital soft tissues within normal limits. Postsurgical changes noted at the right globe. Paranasal sinuses and mastoid air cells are clear. Other: None. IMPRESSION: 1. No acute  intracranial process. 2. Stable atrophy with chronic small vessel ischemic disease and remote left PCA territory infarct. Electronically Signed   By: Jeannine Boga M.D.   On: 11/26/2016 20:28   Ct Cervical Spine W Contrast  Result Date: 11/26/2016 CLINICAL DATA:  Neck pain ,infection suspected EXAM: CT CERVICAL SPINE WITH CONTRAST TECHNIQUE: Multidetector CT imaging of the cervical spine was performed during intravenous contrast administration. Multiplanar CT image reconstructions were also generated. CONTRAST:  80mL ISOVUE-300 IOPAMIDOL (ISOVUE-300) INJECTION 61%, Cleared by for dr Jonnie Finner COMPARISON:  11/23/2016 FINDINGS: Alignment: Chronic anterolisthesis of C3 on C4, up to 1mm. Reversal of the normal cervical lordosis is again noted. Skull base and vertebrae: Negative for acute fracture. Osteoarthritis of the atlantooccipital articulation. Partially calcified pannus around the atlantoaxial articulation. Soft tissues and spinal canal: No prevertebral fluid or swelling. No visible canal hematoma.  No areas of unexpected enhancement. Disc levels: Mild disc space narrowing C2-3 and C3-4 with marked disc space narrowing C4 through C7. Small posterior marginal osteophytes and uncovertebral joint spurring is noted from C4 through C7. Multilevel degenerative facet arthropathy is seen as Before, left worse than right. Upper chest: Right subclavian transvenous pacemaker, incompletely visualized. Bilateral pleural effusions, new since prior study. Mediastinal lymph nodes measured up to 12 mm short axis diameter tunneled possibly reactive but nonspecific. Other: Moderate bilateral extracranial carotid arteriosclerosis. High-grade occlusive disease at the left carotid bifurcation extending through the ICA. Heavily calcified plaque in the proximal right ICA resulting in at least moderate short segment stenosis. Encephalomalacia in the left parieto-occipital and inferior left temporal lobe as before. IMPRESSION: 1.  Negative for abscess or other unexpected enhancement. 2. Stable multilevel cervical spondylitic changes. 3. New bilateral pleural effusions . 4. Extensive bilateral extracranial carotid arterial disease. Electronically Signed   By: Lucrezia Europe M.D.   On: 11/26/2016 08:35        Scheduled Meds: . atorvastatin  20 mg Oral Daily  . bromocriptine  5 mg Oral QHS  . calcium acetate  667 mg Oral TID WC  . cinacalcet  30 mg Oral QPM  . dorzolamide-timolol  1 drop Both Eyes BID  . loratadine  10 mg Oral Daily  . mouth rinse  15 mL Mouth Rinse BID  . metoprolol tartrate  50 mg Oral BID  . midodrine  15 mg Oral Q M,W,F-HD  . pantoprazole  40 mg Oral Daily  . polyethylene glycol  17 g Oral Daily  . predniSONE  5 mg Oral Daily  . senna-docusate  1 tablet Oral BID  . sertraline  50 mg Oral Daily   Continuous Infusions: . cefTRIAXone (ROCEPHIN)  IV Stopped (11/26/16 1733)  . gentamicin Stopped (11/26/16 1522)  . methocarbamol (ROBAXIN)  IV 500 mg (11/27/16 1139)     LOS: 4 days    Time spent: 35 minutes.    Hosie Poisson, MD [facility] Pager [phone]   If 7PM-7AM, please contact night-coverage [email or web address] Password [facility] 11/27/2016, 12:38 PM

## 2016-11-27 NOTE — Progress Notes (Signed)
Notified Dr Jonnie Finner of pt's standing weight, 88.1kg.

## 2016-11-27 NOTE — Progress Notes (Signed)
[facility]   Reason for visit: Follow up on bacteremia  Interval History: repeat blood cultures NGTD; still with significant neck pain and with right hip pain new since admission.  No fever, no chills. WBC stable at 12.9. Day 2 ceftriaxone Day 4 gentamicin  Physical Exam: Constitutional:  Vitals:   11/27/16 0732 11/27/16 1123  BP: 117/78 (!) 118/94  Pulse: (!) 114 (!) 112  Resp: (!) 26 19  Temp: 97.9 F (36.6 C) 98.4 F (36.9 C)  SpO2: 95% 96%   patient appears in NAD Eyes: anicteric Respiratory: Normal respiratory effort; CTA B Cardiovascular: RRR GI: soft, nt, nd MS: right hip with no obvious effusion on exam, no warmth; tender with palpation and pain with movement; HENT: neck with tenderness over cervical area, unchanged  Review of Systems: Constitutional: negative for fevers and chills Gastrointestinal: negative for diarrhea Musculoskeletal: negative for arthralgias, stiff joints and muscle weakness  Lab Results  Component Value Date   WBC 12.9 (H) 11/26/2016   HGB 10.1 (L) 11/26/2016   HCT 31.3 (L) 11/26/2016   MCV 94.8 11/26/2016   PLT 166 11/26/2016    Lab Results  Component Value Date   CREATININE 5.77 (H) 11/26/2016   BUN 45 (H) 11/26/2016   NA 136 11/26/2016   K 3.7 11/26/2016   CL 96 (L) 11/26/2016   CO2 26 11/26/2016    Lab Results  Component Value Date   ALT 37 11/23/2016   AST 55 (H) 11/23/2016   ALKPHOS 70 11/23/2016     Microbiology: Recent Results (from the past 240 hour(s))  Blood Culture (routine x 2)     Status: Abnormal   Collection Time: 11/23/16  8:15 PM  Result Value Ref Range Status   Specimen Description BLOOD RIGHT ANTECUBITAL  Final   Special Requests   Final    BOTTLES DRAWN AEROBIC AND ANAEROBIC Blood Culture adequate volume   Culture  Setup Time   Final    GRAM POSITIVE COCCI IN PAIRS AND CHAINS IN BOTH AEROBIC AND ANAEROBIC BOTTLES CRITICAL VALUE NOTED.  VALUE IS CONSISTENT WITH  PREVIOUSLY REPORTED AND CALLED VALUE.    Culture (A)  Final    STREPTOCOCCUS GALLOLYTICUS SUSCEPTIBILITIES PERFORMED ON PREVIOUS CULTURE WITHIN THE LAST 5 DAYS.    Report Status 11/26/2016 FINAL  Final  Blood Culture (routine x 2)     Status: Abnormal   Collection Time: 11/23/16  8:22 PM  Result Value Ref Range Status   Specimen Description BLOOD RIGHT WRIST  Final   Special Requests   Final    BOTTLES DRAWN AEROBIC AND ANAEROBIC Blood Culture adequate volume   Culture  Setup Time   Final    GRAM POSITIVE COCCI IN PAIRS AND CHAINS IN BOTH AEROBIC AND ANAEROBIC BOTTLES CRITICAL RESULT CALLED TO, READ BACK BY AND VERIFIED WITH: Truett Perna.D. 10:35 11/24/16 ([email or web address])    Culture STREPTOCOCCUS GALLOLYTICUS (A)  Final   Report Status 11/26/2016 FINAL  Final   Organism ID, Bacteria STREPTOCOCCUS GALLOLYTICUS  Final      Susceptibility   Streptococcus gallolyticus - MIC*    PENICILLIN 0.12 SENSITIVE Sensitive     CEFTRIAXONE <=0.12 SENSITIVE Sensitive     ERYTHROMYCIN INTERMEDIATE Intermediate     LEVOFLOXACIN 2 SENSITIVE Sensitive     VANCOMYCIN 0.5 SENSITIVE Sensitive     * STREPTOCOCCUS GALLOLYTICUS  Blood Culture ID Panel (Reflexed)     Status: Abnormal   Collection Time: 11/23/16  8:22 PM  Result  Value Ref Range Status   Enterococcus species NOT DETECTED NOT DETECTED Final   Listeria monocytogenes NOT DETECTED NOT DETECTED Final   Staphylococcus species NOT DETECTED NOT DETECTED Final   Staphylococcus aureus NOT DETECTED NOT DETECTED Final   Streptococcus species DETECTED (A) NOT DETECTED Final    Comment: Not Enterococcus species, Streptococcus agalactiae, Streptococcus pyogenes, or Streptococcus pneumoniae. CRITICAL RESULT CALLED TO, READ BACK BY AND VERIFIED WITH: M. Radford Pax Pharm.D. 10:35 11/24/16 ([email or web address])    Streptococcus agalactiae NOT DETECTED NOT DETECTED Final   Streptococcus pneumoniae NOT DETECTED NOT DETECTED Final   Streptococcus pyogenes NOT DETECTED NOT  DETECTED Final   Acinetobacter baumannii NOT DETECTED NOT DETECTED Final   Enterobacteriaceae species NOT DETECTED NOT DETECTED Final   Enterobacter cloacae complex NOT DETECTED NOT DETECTED Final   Escherichia coli NOT DETECTED NOT DETECTED Final   Klebsiella oxytoca NOT DETECTED NOT DETECTED Final   Klebsiella pneumoniae NOT DETECTED NOT DETECTED Final   Proteus species NOT DETECTED NOT DETECTED Final   Serratia marcescens NOT DETECTED NOT DETECTED Final   Haemophilus influenzae NOT DETECTED NOT DETECTED Final   Neisseria meningitidis NOT DETECTED NOT DETECTED Final   Pseudomonas aeruginosa NOT DETECTED NOT DETECTED Final   Candida albicans NOT DETECTED NOT DETECTED Final   Candida glabrata NOT DETECTED NOT DETECTED Final   Candida krusei NOT DETECTED NOT DETECTED Final   Candida parapsilosis NOT DETECTED NOT DETECTED Final   Candida tropicalis NOT DETECTED NOT DETECTED Final  MRSA PCR Screening     Status: None   Collection Time: 11/24/16  3:25 AM  Result Value Ref Range Status   MRSA by PCR NEGATIVE NEGATIVE Final    Comment:        The GeneXpert MRSA Assay (FDA approved for NASAL specimens only), is one component of a comprehensive MRSA colonization surveillance program. It is not intended to diagnose MRSA infection nor to guide or monitor treatment for MRSA infections.   Culture, blood (routine x 2)     Status: None (Preliminary result)   Collection Time: 11/25/16 10:18 AM  Result Value Ref Range Status   Specimen Description BLOOD RIGHT WRIST  Final   Special Requests   Final    BOTTLES DRAWN AEROBIC AND ANAEROBIC Blood Culture adequate volume   Culture NO GROWTH 2 DAYS  Final   Report Status PENDING  Incomplete  Culture, blood (routine x 2)     Status: None (Preliminary result)   Collection Time: 11/25/16 10:18 AM  Result Value Ref Range Status   Specimen Description BLOOD RIGHT HAND  Final   Special Requests   Final    BOTTLES DRAWN AEROBIC AND ANAEROBIC Blood  Culture adequate volume   Culture NO GROWTH 2 DAYS  Final   Report Status PENDING  Incomplete    Impression/Plan:  1. Strep gallolyticus bacteremia - repeat cultures ngtd.  On ceftriaxone and gent.  TEE being scheduled.    2.  Neck pain - CT without obvious infection but still concerning.  Will continue to observe.  3.  Hip pain - will evaluate this for possible infection with CT scan.    4.  PM - TEE as above to be sure no vegetation.

## 2016-11-28 DIAGNOSIS — E785 Hyperlipidemia, unspecified: Secondary | ICD-10-CM

## 2016-11-28 LAB — CBC
HCT: 32.5 % — ABNORMAL LOW (ref 39.0–52.0)
Hemoglobin: 10.7 g/dL — ABNORMAL LOW (ref 13.0–17.0)
MCH: 30.8 pg (ref 26.0–34.0)
MCHC: 32.9 g/dL (ref 30.0–36.0)
MCV: 93.7 fL (ref 78.0–100.0)
Platelets: 195 10*3/uL (ref 150–400)
RBC: 3.47 MIL/uL — ABNORMAL LOW (ref 4.22–5.81)
RDW: 17.7 % — AB (ref 11.5–15.5)
WBC: 10.9 10*3/uL — ABNORMAL HIGH (ref 4.0–10.5)

## 2016-11-28 LAB — PROTIME-INR
INR: 8.84 — AB
INR: 1.81
Prothrombin Time: 71.9 seconds — ABNORMAL HIGH (ref 11.4–15.2)
Prothrombin Time: 20.9 seconds — ABNORMAL HIGH (ref 11.4–15.2)

## 2016-11-28 LAB — GLUCOSE, CAPILLARY
GLUCOSE-CAPILLARY: 96 mg/dL (ref 65–99)
GLUCOSE-CAPILLARY: 139 mg/dL — AB (ref 65–99)
Glucose-Capillary: 102 mg/dL — ABNORMAL HIGH (ref 65–99)
Glucose-Capillary: 89 mg/dL (ref 65–99)
Glucose-Capillary: 144 mg/dL — ABNORMAL HIGH (ref 65–99)

## 2016-11-28 LAB — BASIC METABOLIC PANEL
Anion gap: 15 (ref 5–15)
BUN: 49 mg/dL — AB (ref 6–20)
CALCIUM: 8.2 mg/dL — AB (ref 8.9–10.3)
CHLORIDE: 94 mmol/L — AB (ref 101–111)
CO2: 25 mmol/L (ref 22–32)
CREATININE: 6.13 mg/dL — AB (ref 0.61–1.24)
GFR calc non Af Amer: 8 mL/min — ABNORMAL LOW (ref >60)
GFR, EST AFRICAN AMERICAN: 9 mL/min — AB (ref >60)
GLUCOSE: 108 mg/dL — AB (ref 65–99)
Potassium: 4.2 mmol/L (ref 3.5–5.1)
Sodium: 134 mmol/L — ABNORMAL LOW (ref 135–145)

## 2016-11-28 MED ORDER — DILTIAZEM HCL 30 MG PO TABS
30.0000 mg | ORAL_TABLET | Freq: Two times a day (BID) | ORAL | Status: DC
  Administered 2016-11-28 – 2016-12-02 (×9): 30 mg via ORAL
  Filled 2016-11-28 (×9): qty 1

## 2016-11-28 MED ORDER — PHYTONADIONE 5 MG PO TABS
2.5000 mg | ORAL_TABLET | Freq: Once | ORAL | Status: AC
  Administered 2016-11-28: 2.5 mg via ORAL
  Filled 2016-11-28: qty 1

## 2016-11-28 MED ORDER — VITAMIN K1 10 MG/ML IJ SOLN
2.5000 mg | Freq: Once | INTRAVENOUS | Status: AC
  Administered 2016-11-28: 2.5 mg via INTRAVENOUS
  Filled 2016-11-28: qty 0.25

## 2016-11-28 MED ORDER — PHYTONADIONE 5 MG PO TABS
2.5000 mg | ORAL_TABLET | Freq: Once | ORAL | Status: DC
  Filled 2016-11-28: qty 1

## 2016-11-28 NOTE — Progress Notes (Signed)
Physician ordered phytonadione (VITAMIN K) tablet 2.5 mg

## 2016-11-28 NOTE — Progress Notes (Signed)
Pharmacy Antibiotic Note  ISSAAC [name] is a 76 y.o. male admitted on 11/23/2016 with rule out meningitis .   Ceftriaxone and Gentamicin to cover for strep gallolyticus bacteremia, repeat cultures are negative.  Plan: Gentamicin 70mg  IV qHD MWF (~1mg /kg/dose) >> check gent peak in am prior to HD No changes to Ceftriaxone  Height: 5' 10.5" (179.1 cm) Weight: 194 lb 3.6 oz (88.1 kg) IBW/kg (Calculated) : 74.15 kg    Temp (24hrs), Avg:97.9 F (36.6 C), Min:97.5 F (36.4 C), Max:98.4 F (36.9 C)   Recent Labs Lab 11/23/16 1951 11/24/16 0312 11/24/16 0604 11/24/16 2326 11/25/16 0513 11/26/16 0138 11/28/16 0346  WBC  --  13.1*  --  13.6* 14.0* 12.9* 10.9*  CREATININE  --   --  8.15* 8.95* 4.09* 5.77* 6.13*  LATICACIDVEN 1.23  --   --   --   --   --   --     Estimated Creatinine Clearance: 10.8 mL/min (A) (by C-G formula based on SCr of 6.13 mg/dL (H)).    Allergies  Allergen Reactions  . Penicillins Swelling    SWELLING REACTION UNSPECIFIED   PATIENT HAD A PCN REACTION WITH IMMEDIATE RASH, FACIAL/TONGUE/THROAT SWELLING, SOB, OR LIGHTHEADEDNESS WITH HYPOTENSION:  #  #  #  YES  #  #  #   Has patient had a PCN reaction causing severe rash involving mucus membranes or skin necrosis: No Has patient had a PCN reaction that required hospitalization No Has patient had a PCN reaction occurring within the last 10 years: No If all of the above answers are "NO", then may proceed with Cephalosporin use.  . Codeine Nausea Only  . Tramadol Nausea Only   Erin Hearing PharmD., BCPS Clinical Pharmacist Pager [phone] 11/28/2016 7:47 AM

## 2016-11-28 NOTE — Progress Notes (Signed)
[facility] Progress Note  Subjective: still having pain in joints/ neck, but "much better" compared to when he came in  Vitals:   11/28/16 0019 11/28/16 0451 11/28/16 0700 11/28/16 1123  BP: 110/77 107/65    Pulse: (!) 115 (!) 112  (!) 108  Resp: 17 (!) 21 (!) 24   Temp: 97.9 F (36.6 C) 98.1 F (36.7 C)    TempSrc: Oral Oral    SpO2: 96% 95%    Weight:      Height:        Inpatient medications: . atorvastatin  20 mg Oral Daily  . bromocriptine  5 mg Oral QHS  . calcium acetate  667 mg Oral TID WC  . cinacalcet  30 mg Oral QPM  . dorzolamide-timolol  1 drop Both Eyes BID  . loratadine  10 mg Oral Daily  . mouth rinse  15 mL Mouth Rinse BID  . metoprolol tartrate  50 mg Oral BID  . midodrine  15 mg Oral Q M,W,F-HD  . pantoprazole  40 mg Oral Daily  . polyethylene glycol  17 g Oral Daily  . predniSONE  5 mg Oral Daily  . senna-docusate  1 tablet Oral BID  . sertraline  50 mg Oral Daily   . cefTRIAXone (ROCEPHIN)  IV 2 g (11/27/16 1808)  . gentamicin Stopped (11/26/16 1522)  . methocarbamol (ROBAXIN)  IV Stopped (11/27/16 1209)   acetaminophen, bisacodyl, bisacodyl, ketorolac, methocarbamol (ROBAXIN)  IV, morphine injection, MUSCLE RUB, ondansetron **OR** ondansetron (ZOFRAN) IV  Exam: NAD, elderly male No jvd Chest clear bilat RRR irreg irreg rhythm, soft murmur Abd soft ntnd no ascites Ext no edema Neuro is NF, Ox 3   CT chest - negative ECHO - no vegetation CT neck - spondylitic changes, no discitis/ abscess  Dialysis: [facility] MWF 4h   88.5kg   Hep 2800  LFA AVF -mircera 50 mg last on 8/15, last Hb 12 on 8/22      Impression: 1  ESRD  - HD MWF 2  Strep gallolyticus bacteremia - w/u in progress, for TEE next week 3  AFib w/ RVR - on coumadin, INR ^ w/ nosebleeds, got vit K; also on MTP bid 4  Chron hypotension - on midodrine 15 pre HD tiw 5  DM2 6  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/ mac degen 7  Depression on Zoloft 8  SP TAVRS (June 2018) 9  SP  PPM 10 Cleora Fleet - demand ischem 11  Vol is at dry wt, use stand scale for wts  Plan - HD Monday   Kelly Splinter MD [facility] pager [phone]   11/28/2016, 12:29 PM    Recent Labs Lab 11/24/16 2326 11/25/16 0513 11/26/16 0138 11/28/16 0346  NA 134* 137 136 134*  K 4.0 3.5 3.7 4.2  CL 97* 100* 96* 94*  CO2 22 24 26 25   GLUCOSE 170* 121* 140* 108*  BUN 68* 24* 45* 49*  CREATININE 8.95* 4.09* 5.77* 6.13*  CALCIUM 7.7* 8.0* 8.2* 8.2*  PHOS 4.4 3.5 3.5  --     Recent Labs Lab 11/23/16 1936  11/24/16 2326 11/25/16 0513 11/26/16 0138  AST 55*  --   --   --   --   ALT 37  --   --   --   --   ALKPHOS 70  --   --   --   --   BILITOT 1.2  --   --   --   --  PROT 6.9  --   --   --   --   ALBUMIN 3.0*  < > 2.5* 2.5* 2.4*  < > = values in this interval not displayed.  Recent Labs Lab 11/24/16 0312  11/25/16 0513 11/26/16 0138 11/28/16 0346  WBC 13.1*  < > 14.0* 12.9* 10.9*  NEUTROABS 11.5*  --  12.8* 11.6*  --   HGB 10.0*  < > 9.9* 10.1* 10.7*  HCT 31.1*  < > 29.8* 31.3* 32.5*  MCV 96.6  < > 94.9 94.8 93.7  PLT 103*  < > 133* 166 195  < > = values in this interval not displayed. Iron/TIBC/Ferritin/ %Sat No results found for: IRON, TIBC, FERRITIN, IRONPCTSAT

## 2016-11-28 NOTE — Progress Notes (Signed)
Notified Dr. Karleen Hampshire of continued sustained HR 100-110's (A-fib) s/p AM dose of Lopressor. MD to follow up. Will continue to monitor.

## 2016-11-28 NOTE — Progress Notes (Signed)
Patient having several nose bleeds. INR 8.84. Physician Notified

## 2016-11-28 NOTE — Progress Notes (Signed)
PROGRESS NOTE    Alexander [name]  GYI:[id number] DOB: [birth year] DOA: 11/23/2016 PCP: Deland Pretty, MD    Brief Narrative:  Alexander [name] is a 76 y.o. male with medical history significant of anemia, aortic stenosis, PUD, GERD, Barrett's esophagus, carpal tunnel syndrome, paroxysmal atrial fibrillation, history of bradycardia (S/Pdual lead pacemaker placement), CVA, depression, type 2 diabetes, diabetic peripheral neuropathy, diverticulosis, ESRD on hemodialysis, glaucoma, hyperlipidemia, hypertension, nephrolithiasis, macular degeneration/legally blind, seasonal allergies, tubular adenoma of the colon who is coming to the emergency department generalized body aches and malaise, muscle spasms of all extremities since monday after he finished hemodialysis. He was admitted for evaluation of meningitis, to step down. Meanwhile he developed a fib with RVR and cardizem gtt was started.   On 8/ 29 afternoon, pt became encephalopathic after he received 2.5 mg of valium , became unresponsive and hypoxic. Dr Florene Glen ordered flumazenil and he reverted back to baseline. Valium was discontinued.  Blood cultures from both aerobic and anaerobic bottles are positive for streptococcus species.  ID Dr Megan Salon consulted for appropriate antibiotics.  8/31 neck pain persistent, PT eval ordered. Cardiology for TEE.  9/1 right hip pain, CT of the right hip ordered negative for acute pathology.   Assessment & Plan:   Principal Problem:   Streptococcal bacteremia Active Problems:   Anemia, unspecified   Severe aortic stenosis   PAD (peripheral artery disease) (HCC)   ESRD (end stage renal disease) on dialysis (HCC)   Hyperlipidemia   Diabetic peripheral neuropathy (HCC)   S/P TAVR (transcatheter aortic valve replacement)   Atrial fibrillation with RVR (HCC)   Glaucoma   GERD (gastroesophageal reflux disease)   Hypomagnesemia   Type 2 diabetes mellitus (HCC)   Depression   SIRS (systemic inflammatory  response syndrome) (HCC)   Presence of permanent cardiac pacemaker  Generalized malaise, neck pain mild rhabdomyolysis.  Sepsis:from streptococcal bacteremia.  Suspected all this is due to STREPTOCOCCUS GALLOLYTICUS bacteremia. CT head without contrast showed old infarcts, repeat CT of the head was also negative for acute pathology, CT neck shows spondylosis.  CT chest without contrast does not show any pneumonia.  Started on IV meropenem and vancomycin, changed to rocephin and gentamicin.  ID consulted, recommended getting TTE and possible repeat CT cervical spine with contrast, discussed with DR Jonnie Finner,  Radiology and patient's daughter and ordered repeat CT cervical spine with contrast to evaluate for discitis and epidural abscess, which was negative for abscess.  Currently afebrile. Leukocytosis with left shift seen, but improving.  Continue with the same management as above.  Echocardiogram ordered and it did not show endocarditis. Cardiology for TEE, possibly next week after the holiday.  Repeat blood cultures have been negative so far.      Afib with RVR, pt has a h/o PAF,: Rate not well controlled, increased the dose of metoprolol to 50 mg BID. Was on coumadin and his INR IS still supra therapeutic. Patient started to have nose bleeds. Ordered 5 mg vitamin K.    Elevated troponin's; From demand ischemia from afib with RVR.  Pt denies any chest pain.   ESRD on HD:  Nephrologist on board .    Anemia of chronic disease:  Hemoglobin around 10. Monitor.   TYPE 2 DM:  CBG (last 3)   Recent Labs  11/27/16 2138 11/28/16 0352 11/28/16 0825  GLUCAP 128* 102* 89    Resume SSI. No change in medications.    H/o severe AS, s/p TAVR:  Was on coumadin at  home, which is being held for supra therapeutic INR.   GERD:  Resume PPI.    HYPOTENSION; on midodrine.  Asymptomatic.   Leukocytosis: suspect its from the bacteremia. Improving.   Right hip pain: CT of the right  hip ordered by ID and is negative for acute pathology.  - pain control with IV toradol.    DVT prophylaxis:heparin sq Code Status: (Full code) Family Communication: daughter  at bedside.  Disposition Plan: pending PT evaluation.    Consultants:   Nephrology  ID  PCCM over the phone.   Procedures: HD  Antimicrobials: MEROPENAM till 8/29.  Gentamicin from 8/30 vancomycin from admission till 8/31 Rocephin from 8/31. .   Subjective: Neck pain has resolved. Headache has improved.  No nausea or vomiting.  No hip  Pain.  No chest pain or sob.    Objective: Vitals:   11/27/16 2100 11/28/16 0019 11/28/16 0451 11/28/16 0700  BP: 109/81 110/77 107/65   Pulse: (!) 124 (!) 115 (!) 112   Resp: (!) 24 17 (!) 21 (!) 24  Temp: (!) 97.5 F (36.4 C) 97.9 F (36.6 C) 98.1 F (36.7 C)   TempSrc: Oral Oral Oral   SpO2: 97% 96% 95%   Weight:      Height:        Intake/Output Summary (Last 24 hours) at 11/28/16 1113 Last data filed at 11/27/16 1300  Gross per 24 hour  Intake              415 ml  Output                0 ml  Net              415 ml   Filed Weights   11/25/16 1234 11/27/16 1246  Weight: 94.8 kg (209 lb) 88.1 kg (194 lb 3.6 oz)    Examination:  General exam: comfortable. Not in any distress.  Respiratory system: good air entry at bases.  Cardiovascular system: tachycardic, s1s2, irregular. No pedal edema. No JVD.  Gastrointestinal system: Abdomen  Soft NT nd bs+ Central nervous system: Alert and oriented. No new deficits.  Extremities: Symmetric 5 x 5 power. No cyanosis or clubbing.  Skin: No rashes, lesions or ulcers Psychiatry: Mood & affect appropriate.     Data Reviewed: I have personally reviewed following labs and imaging studies  CBC:  Recent Labs Lab 11/24/16 0312 11/24/16 2326 11/25/16 0513 11/26/16 0138 11/28/16 0346  WBC 13.1* 13.6* 14.0* 12.9* 10.9*  NEUTROABS 11.5*  --  12.8* 11.6*  --   HGB 10.0* 9.7* 9.9* 10.1* 10.7*  HCT  31.1* 29.9* 29.8* 31.3* 32.5*  MCV 96.6 95.2 94.9 94.8 93.7  PLT 103* 124* 133* 166 119   Basic Metabolic Panel:  Recent Labs Lab 11/23/16 1936 11/24/16 0604 11/24/16 2326 11/25/16 0513 11/26/16 0138 11/28/16 0346  NA  --  136 134* 137 136 134*  K  --  4.2 4.0 3.5 3.7 4.2  CL  --  98* 97* 100* 96* 94*  CO2  --  24 22 24 26 25   GLUCOSE  --  174* 170* 121* 140* 108*  BUN  --  50* 68* 24* 45* 49*  CREATININE  --  8.15* 8.95* 4.09* 5.77* 6.13*  CALCIUM  --  7.9* 7.7* 8.0* 8.2* 8.2*  MG 1.5*  --   --   --   --   --   PHOS  --  4.1 4.4 3.5 3.5  --  GFR: Estimated Creatinine Clearance: 10.8 mL/min (A) (by C-G formula based on SCr of 6.13 mg/dL (H)). Liver Function Tests:  Recent Labs Lab 11/23/16 1936 11/24/16 0604 11/24/16 2326 11/25/16 0513 11/26/16 0138  AST 55*  --   --   --   --   ALT 37  --   --   --   --   ALKPHOS 70  --   --   --   --   BILITOT 1.2  --   --   --   --   PROT 6.9  --   --   --   --   ALBUMIN 3.0* 2.6* 2.5* 2.5* 2.4*   No results for input(s): LIPASE, AMYLASE in the last 168 hours. No results for input(s): AMMONIA in the last 168 hours. Coagulation Profile:  Recent Labs Lab 11/25/16 0513 11/25/16 1018 11/26/16 0138 11/27/16 0227 11/28/16 0346  INR 6.72* 7.00* 6.38* 5.47* 8.84*   Cardiac Enzymes:  Recent Labs Lab 11/23/16 1936 11/24/16 0312 11/24/16 0604  CKTOTAL 630*  --   --   TROPONINI 0.09* 0.07* 0.07*   BNP (last 3 results)  Recent Labs  11/11/16 1143  PROBNP 18,028*   HbA1C: No results for input(s): HGBA1C in the last 72 hours. CBG:  Recent Labs Lab 11/27/16 1122 11/27/16 1654 11/27/16 2138 11/28/16 0352 11/28/16 0825  GLUCAP 126* 146* 128* 102* 89   Lipid Profile: No results for input(s): CHOL, HDL, LDLCALC, TRIG, CHOLHDL, LDLDIRECT in the last 72 hours. Thyroid Function Tests: No results for input(s): TSH, T4TOTAL, FREET4, T3FREE, THYROIDAB in the last 72 hours. Anemia Panel: No results for input(s):  VITAMINB12, FOLATE, FERRITIN, TIBC, IRON, RETICCTPCT in the last 72 hours. Sepsis Labs:  Recent Labs Lab 11/23/16 1951  LATICACIDVEN 1.23    Recent Results (from the past 240 hour(s))  Blood Culture (routine x 2)     Status: Abnormal   Collection Time: 11/23/16  8:15 PM  Result Value Ref Range Status   Specimen Description BLOOD RIGHT ANTECUBITAL  Final   Special Requests   Final    BOTTLES DRAWN AEROBIC AND ANAEROBIC Blood Culture adequate volume   Culture  Setup Time   Final    GRAM POSITIVE COCCI IN PAIRS AND CHAINS IN BOTH AEROBIC AND ANAEROBIC BOTTLES CRITICAL VALUE NOTED.  VALUE IS CONSISTENT WITH PREVIOUSLY REPORTED AND CALLED VALUE.    Culture (A)  Final    STREPTOCOCCUS GALLOLYTICUS SUSCEPTIBILITIES PERFORMED ON PREVIOUS CULTURE WITHIN THE LAST 5 DAYS.    Report Status 11/26/2016 FINAL  Final  Blood Culture (routine x 2)     Status: Abnormal   Collection Time: 11/23/16  8:22 PM  Result Value Ref Range Status   Specimen Description BLOOD RIGHT WRIST  Final   Special Requests   Final    BOTTLES DRAWN AEROBIC AND ANAEROBIC Blood Culture adequate volume   Culture  Setup Time   Final    GRAM POSITIVE COCCI IN PAIRS AND CHAINS IN BOTH AEROBIC AND ANAEROBIC BOTTLES CRITICAL RESULT CALLED TO, READ BACK BY AND VERIFIED WITH: Truett Perna.D. 10:35 11/24/16 ([email or web address])    Culture STREPTOCOCCUS GALLOLYTICUS (A)  Final   Report Status 11/26/2016 FINAL  Final   Organism ID, Bacteria STREPTOCOCCUS GALLOLYTICUS  Final      Susceptibility   Streptococcus gallolyticus - MIC*    PENICILLIN 0.12 SENSITIVE Sensitive     CEFTRIAXONE <=0.12 SENSITIVE Sensitive     ERYTHROMYCIN INTERMEDIATE Intermediate     LEVOFLOXACIN 2 SENSITIVE  Sensitive     VANCOMYCIN 0.5 SENSITIVE Sensitive     * STREPTOCOCCUS GALLOLYTICUS  Blood Culture ID Panel (Reflexed)     Status: Abnormal   Collection Time: 11/23/16  8:22 PM  Result Value Ref Range Status   Enterococcus species NOT DETECTED NOT  DETECTED Final   Listeria monocytogenes NOT DETECTED NOT DETECTED Final   Staphylococcus species NOT DETECTED NOT DETECTED Final   Staphylococcus aureus NOT DETECTED NOT DETECTED Final   Streptococcus species DETECTED (A) NOT DETECTED Final    Comment: Not Enterococcus species, Streptococcus agalactiae, Streptococcus pyogenes, or Streptococcus pneumoniae. CRITICAL RESULT CALLED TO, READ BACK BY AND VERIFIED WITH: M. Radford Pax Pharm.D. 10:35 11/24/16 ([email or web address])    Streptococcus agalactiae NOT DETECTED NOT DETECTED Final   Streptococcus pneumoniae NOT DETECTED NOT DETECTED Final   Streptococcus pyogenes NOT DETECTED NOT DETECTED Final   Acinetobacter baumannii NOT DETECTED NOT DETECTED Final   Enterobacteriaceae species NOT DETECTED NOT DETECTED Final   Enterobacter cloacae complex NOT DETECTED NOT DETECTED Final   Escherichia coli NOT DETECTED NOT DETECTED Final   Klebsiella oxytoca NOT DETECTED NOT DETECTED Final   Klebsiella pneumoniae NOT DETECTED NOT DETECTED Final   Proteus species NOT DETECTED NOT DETECTED Final   Serratia marcescens NOT DETECTED NOT DETECTED Final   Haemophilus influenzae NOT DETECTED NOT DETECTED Final   Neisseria meningitidis NOT DETECTED NOT DETECTED Final   Pseudomonas aeruginosa NOT DETECTED NOT DETECTED Final   Candida albicans NOT DETECTED NOT DETECTED Final   Candida glabrata NOT DETECTED NOT DETECTED Final   Candida krusei NOT DETECTED NOT DETECTED Final   Candida parapsilosis NOT DETECTED NOT DETECTED Final   Candida tropicalis NOT DETECTED NOT DETECTED Final  MRSA PCR Screening     Status: None   Collection Time: 11/24/16  3:25 AM  Result Value Ref Range Status   MRSA by PCR NEGATIVE NEGATIVE Final    Comment:        The GeneXpert MRSA Assay (FDA approved for NASAL specimens only), is one component of a comprehensive MRSA colonization surveillance program. It is not intended to diagnose MRSA infection nor to guide or monitor treatment for MRSA  infections.   Culture, blood (routine x 2)     Status: None (Preliminary result)   Collection Time: 11/25/16 10:18 AM  Result Value Ref Range Status   Specimen Description BLOOD RIGHT WRIST  Final   Special Requests   Final    BOTTLES DRAWN AEROBIC AND ANAEROBIC Blood Culture adequate volume   Culture NO GROWTH 3 DAYS  Final   Report Status PENDING  Incomplete  Culture, blood (routine x 2)     Status: None (Preliminary result)   Collection Time: 11/25/16 10:18 AM  Result Value Ref Range Status   Specimen Description BLOOD RIGHT HAND  Final   Special Requests   Final    BOTTLES DRAWN AEROBIC AND ANAEROBIC Blood Culture adequate volume   Culture NO GROWTH 3 DAYS  Final   Report Status PENDING  Incomplete         Radiology Studies: Ct Head Wo Contrast  Result Date: 11/26/2016 CLINICAL DATA:  Initial evaluation for acute severe headache. Supratherapeutic I NR. EXAM: CT HEAD WITHOUT CONTRAST TECHNIQUE: Contiguous axial images were obtained from the base of the skull through the vertex without intravenous contrast. COMPARISON:  Prior CT from 11/23/2016. FINDINGS: Brain: Generalized age related cerebral atrophy with chronic small vessel ischemic disease. Large remote left PCA territory infarct, stable. No acute intracranial hemorrhage. No evidence  for acute large vessel territory infarct. No mass lesion, midline shift or mass effect. No hydrocephalus. No extra-axial fluid collection. Vascular: No hyperdense vessel. Scattered vascular calcifications noted within the carotid siphons. Skull: Scalp soft tissues and calvarium within normal limits. Sinuses/Orbits: Globes and orbital soft tissues within normal limits. Postsurgical changes noted at the right globe. Paranasal sinuses and mastoid air cells are clear. Other: None. IMPRESSION: 1. No acute intracranial process. 2. Stable atrophy with chronic small vessel ischemic disease and remote left PCA territory infarct. Electronically Signed   By:  Jeannine Boga M.D.   On: 11/26/2016 20:28   Ct Hip Right Wo Contrast  Result Date: 11/27/2016 CLINICAL DATA:  Bacteremia. End-stage renal disease. Right hip pain. EXAM: CT OF THE RIGHT HIP WITHOUT CONTRAST TECHNIQUE: Multidetector CT imaging of the right hip was performed according to the standard protocol. Multiplanar CT image reconstructions were also generated. COMPARISON:  CT pelvis from 08/24/2016 FINDINGS: Bones/Joint/Cartilage No fracture or acute bony findings. Mild chondrocalcinosis along the femoral head articular cartilage. Ligaments Suboptimally assessed by CT. Muscles and Tendons No abnormality or change from 08/24/2016. Soft tissues Sigmoid diverticulosis. Atherosclerotic calcification of regional arterials structures. No regional bursitis. No pathologic adenopathy at the right groin. No impingement at the sciatic notch. Faint calcifications in the hamstring tendons proximally, unchanged from prior. Calcification in the pubic symphysis. Contrast medium noted in the urinary bladder. Subtle presacral stranding, similar to prior. Right direct inguinal hernia contains adipose tissue. IMPRESSION: 1. No hip effusion, regional bursitis, or specific cause for right hip pain identified. No acute bony findings. 2. Mild chondrocalcinosis suggesting CPPD arthropathy. 3. Small right direct inguinal hernia contains adipose tissue. 4. Sigmoid diverticulosis. 5. Atherosclerosis. Electronically Signed   By: Van Clines M.D.   On: 11/27/2016 16:31        Scheduled Meds: . atorvastatin  20 mg Oral Daily  . bromocriptine  5 mg Oral QHS  . calcium acetate  667 mg Oral TID WC  . cinacalcet  30 mg Oral QPM  . dorzolamide-timolol  1 drop Both Eyes BID  . loratadine  10 mg Oral Daily  . mouth rinse  15 mL Mouth Rinse BID  . metoprolol tartrate  50 mg Oral BID  . midodrine  15 mg Oral Q M,W,F-HD  . pantoprazole  40 mg Oral Daily  . polyethylene glycol  17 g Oral Daily  . predniSONE  5 mg Oral  Daily  . senna-docusate  1 tablet Oral BID  . sertraline  50 mg Oral Daily   Continuous Infusions: . cefTRIAXone (ROCEPHIN)  IV 2 g (11/27/16 1808)  . gentamicin Stopped (11/26/16 1522)  . methocarbamol (ROBAXIN)  IV Stopped (11/27/16 1209)  . phytonadione (VITAMIN K) IV       LOS: 5 days    Time spent: 35 minutes.    Hosie Poisson, MD [facility] Pager [phone]   If 7PM-7AM, please contact night-coverage [email or web address] Password [email or web address] 11/28/2016, 11:13 AM

## 2016-11-28 NOTE — Progress Notes (Addendum)
Notified Dr. Karleen Hampshire of continued A-fib with HR sustaining 110's. Also notified of several nose bleeds reported overnight, as well as x1 this AM. Pressure held & no active bleeding at this time. MD to address and change ordered Vitamin K to IV administration. No new orders received at this time for HR. MD stated to notify her if HR remains elevated post scheduled AM dose of lopressor. Pt and daughter updated. Will continue to monitor.

## 2016-11-28 NOTE — Progress Notes (Signed)
PT Cancellation Note  Patient Details Name: Alexander [name] MRN: [id number] DOB: November 14, 1940   Cancelled Treatment:    Reason Eval/Treat Not Completed: Medical issues which prohibited therapy. Patient with INR of 8.84, and having nose bleeds.  Spoke with RN.  Will hold PT today and return tomorrow for PT evaluation is appropriate for patient.   Despina Pole 11/28/2016, 10:34 AM Carita Pian. Sanjuana Kava, [place] Pager [phone]

## 2016-11-29 LAB — RENAL FUNCTION PANEL
ANION GAP: 15 (ref 5–15)
Albumin: 2.4 g/dL — ABNORMAL LOW (ref 3.5–5.0)
BUN: 63 mg/dL — ABNORMAL HIGH (ref 6–20)
CALCIUM: 7.8 mg/dL — AB (ref 8.9–10.3)
CO2: 25 mmol/L (ref 22–32)
CREATININE: 7.23 mg/dL — AB (ref 0.61–1.24)
Chloride: 93 mmol/L — ABNORMAL LOW (ref 101–111)
GFR, EST AFRICAN AMERICAN: 8 mL/min — AB (ref >60)
GFR, EST NON AFRICAN AMERICAN: 6 mL/min — AB (ref >60)
Glucose, Bld: 119 mg/dL — ABNORMAL HIGH (ref 65–99)
Phosphorus: 4.9 mg/dL — ABNORMAL HIGH (ref 2.5–4.6)
Potassium: 4.1 mmol/L (ref 3.5–5.1)
SODIUM: 133 mmol/L — AB (ref 135–145)

## 2016-11-29 LAB — GLUCOSE, CAPILLARY
GLUCOSE-CAPILLARY: 83 mg/dL (ref 65–99)
GLUCOSE-CAPILLARY: 109 mg/dL — AB (ref 65–99)
GLUCOSE-CAPILLARY: 102 mg/dL — AB (ref 65–99)

## 2016-11-29 LAB — CBC
HCT: 30.5 % — ABNORMAL LOW (ref 39.0–52.0)
HEMOGLOBIN: 10 g/dL — AB (ref 13.0–17.0)
MCH: 30.7 pg (ref 26.0–34.0)
MCHC: 32.8 g/dL (ref 30.0–36.0)
MCV: 93.6 fL (ref 78.0–100.0)
PLATELETS: 226 10*3/uL (ref 150–400)
RBC: 3.26 MIL/uL — AB (ref 4.22–5.81)
RDW: 17.9 % — ABNORMAL HIGH (ref 11.5–15.5)
WBC: 10.3 10*3/uL (ref 4.0–10.5)

## 2016-11-29 LAB — PROTIME-INR
INR: 1.55
PROTHROMBIN TIME: 18.4 s — AB (ref 11.4–15.2)

## 2016-11-29 LAB — GENTAMICIN LEVEL, PEAK: GENTAMICIN PK: 3.9 ug/mL — AB (ref 5.0–10.0)

## 2016-11-29 MED ORDER — PENTAFLUOROPROP-TETRAFLUOROETH EX AERO: 1.0000 "application " | INHALATION_SPRAY | CUTANEOUS | Status: DC | PRN

## 2016-11-29 MED ORDER — SENNOSIDES-DOCUSATE SODIUM 8.6-50 MG PO TABS: 1.0000 | ORAL_TABLET | Freq: Two times a day (BID) | ORAL | Status: DC | PRN

## 2016-11-29 MED ORDER — ALTEPLASE 2 MG IJ SOLR: 2.0000 mg | Freq: Once | INTRAMUSCULAR | Status: DC | PRN

## 2016-11-29 MED ORDER — WARFARIN SODIUM 5 MG PO TABS
5.0000 mg | ORAL_TABLET | Freq: Once | ORAL | Status: AC
  Administered 2016-11-29: 5 mg via ORAL
  Filled 2016-11-29: qty 1

## 2016-11-29 MED ORDER — WARFARIN - PHARMACIST DOSING INPATIENT
Freq: Every day | Status: DC
  Administered 2016-12-02: 18:00:00

## 2016-11-29 MED ORDER — LIDOCAINE HCL (PF) 1 % IJ SOLN: 5.0000 mL | INTRAMUSCULAR | Status: DC | PRN

## 2016-11-29 MED ORDER — LIDOCAINE-PRILOCAINE 2.5-2.5 % EX CREA: 1.0000 "application " | TOPICAL_CREAM | CUTANEOUS | Status: DC | PRN

## 2016-11-29 MED ORDER — SODIUM CHLORIDE 0.9 % IV SOLN: 100.0000 mL | INTRAVENOUS | Status: DC | PRN

## 2016-11-29 MED ORDER — HEPARIN SODIUM (PORCINE) 1000 UNIT/ML DIALYSIS: 1000.0000 [IU] | INTRAMUSCULAR | Status: DC | PRN

## 2016-11-29 MED ORDER — MIDODRINE HCL 5 MG PO TABS
ORAL_TABLET | ORAL | Status: AC
  Administered 2016-11-29: 15 mg via ORAL
  Filled 2016-11-29: qty 3

## 2016-11-29 MED ORDER — LORAZEPAM 1 MG PO TABS
1.0000 mg | ORAL_TABLET | Freq: Once | ORAL | Status: AC
  Administered 2016-11-29: 1 mg via ORAL
  Filled 2016-11-29: qty 1

## 2016-11-29 NOTE — Progress Notes (Signed)
Patient INR came down to 1.81 from 8.84. Pharmacy notified of level. Pharmacy to pass information along to day shift. Will continue to monitor.

## 2016-11-29 NOTE — Progress Notes (Signed)
[facility] for Infectious Disease  Date of Admission:  11/23/2016   Total days of antibiotics 7        Day 6 Gent        Day 4 Ceftriaxone          FU on streptococcus gallolyticus bacteremia   ASSESSMENT: Streptococcus gallolyticus bacteremia  [BCx from 8/30 >> NG x 4d; S/P TAVR in June 2018] Neck Pain - improved   PLAN: 1. Continue Ceftriaxone + Gentamycin - Tolerating cephalosporin well. Duration still TBD while awaiting more testing.  2. TEE to be scheduled - cardiac device and prosthetic valve in place  3. Would wait on PICC/tunneled cath until full 5 days NG on BCx -- will need to discuss further with nephrology team as he is on dialysis. May need to change regimen to allow for admin with HD tx if cannot place line for daily administration  4. Would hold senna as he is complaining of some loose BMs - changed this to PRN    Janene Madeira, MSN, NP-C [facility] for Infectious Disease [place] Pager: [phone]   11/29/2016  1:26 PM     Principal Problem:   Streptococcal bacteremia Active Problems:   Anemia, unspecified   Severe aortic stenosis   PAD (peripheral artery disease) (HCC)   ESRD (end stage renal disease) on dialysis ([place])   Hyperlipidemia   Diabetic peripheral neuropathy (HCC)   S/P TAVR (transcatheter aortic valve replacement)   Atrial fibrillation with RVR (HCC)   Glaucoma   GERD (gastroesophageal reflux disease)   Hypomagnesemia   Type 2 diabetes mellitus ([place])   Depression   SIRS (systemic inflammatory response syndrome) ([place])   Presence of permanent cardiac pacemaker   . atorvastatin  20 mg Oral Daily  . bromocriptine  5 mg Oral QHS  . calcium acetate  667 mg Oral TID WC  . cinacalcet  30 mg Oral QPM  . diltiazem  30 mg Oral Q12H  . dorzolamide-timolol  1 drop Both Eyes BID  . loratadine  10 mg Oral Daily  . mouth rinse  15 mL Mouth Rinse BID  . metoprolol tartrate  50 mg Oral BID  . midodrine  15  mg Oral Q M,W,F-HD  . pantoprazole  40 mg Oral Daily  . polyethylene glycol  17 g Oral Daily  . predniSONE  5 mg Oral Daily  . senna-docusate  1 tablet Oral BID  . sertraline  50 mg Oral Daily    SUBJECTIVE: Neck pain is improved today. Groggy talking to him today. Has not eaten much and did not sleep last night. Having some loose bowel movements but < 3 daily - this started yesterday.   Review of Systems: Review of Systems  Constitutional: Positive for malaise/fatigue. Negative for chills and fever.  HENT: Negative for hearing loss and tinnitus.   Respiratory: Negative for cough and shortness of breath.   Cardiovascular: Negative for chest pain and orthopnea.  Gastrointestinal: Positive for diarrhea. Negative for nausea and vomiting.  Musculoskeletal: Positive for neck pain.  Skin: Negative for rash.  Neurological: Negative for dizziness and headaches.    Allergies  Allergen Reactions  . Penicillins Swelling    SWELLING REACTION UNSPECIFIED   PATIENT HAD A PCN REACTION WITH IMMEDIATE RASH, FACIAL/TONGUE/THROAT SWELLING, SOB, OR LIGHTHEADEDNESS WITH HYPOTENSION:  #  #  #  YES  #  #  #   Has patient had a PCN reaction causing  severe rash involving mucus membranes or skin necrosis: No Has patient had a PCN reaction that required hospitalization No Has patient had a PCN reaction occurring within the last 10 years: No If all of the above answers are "NO", then may proceed with Cephalosporin use.  . Codeine Nausea Only  . Tramadol Nausea Only    OBJECTIVE: Vitals:   11/28/16 2026 11/28/16 2326 11/29/16 0808 11/29/16 1152  BP:  113/74 110/74 95/69  Pulse:  92 85 88  Resp:  (!) 23 (!) 22 14  Temp: 98.3 F (36.8 C) 97.6 F (36.4 C) (!) 97.5 F (36.4 C) 97.6 F (36.4 C)  TempSrc: Oral Oral Oral Oral  SpO2:  90% 96% 95%  Weight:      Height:       Body mass index is 27.47 kg/m.  Physical Exam  Constitutional: He is oriented to person, place, and time and well-developed,  well-nourished, and in no distress.  Lying in bed. Groggy on assessment and only participates occasionally in conversation. Daughter is present at the bedside.   Eyes: No scleral icterus.  Neck: Full passive range of motion without pain. Neck supple. No spinous process tenderness present.  Cardiovascular: Normal rate and normal pulses.  An irregular rhythm present.  Pulmonary/Chest: Effort normal and breath sounds normal.  Abdominal: Soft. Bowel sounds are normal. He exhibits no distension. There is no tenderness.  Musculoskeletal: He exhibits no tenderness.  Neurological: He is oriented to person, place, and time. GCS score is 15.  Skin: Skin is warm and dry.  Psychiatric: Affect normal.    Lab Results Lab Results  Component Value Date   WBC 10.9 (H) 11/28/2016   HGB 10.7 (L) 11/28/2016   HCT 32.5 (L) 11/28/2016   MCV 93.7 11/28/2016   PLT 195 11/28/2016    Lab Results  Component Value Date   CREATININE 6.13 (H) 11/28/2016   BUN 49 (H) 11/28/2016   NA 134 (L) 11/28/2016   K 4.2 11/28/2016   CL 94 (L) 11/28/2016   CO2 25 11/28/2016    Lab Results  Component Value Date   ALT 37 11/23/2016   AST 55 (H) 11/23/2016   ALKPHOS 70 11/23/2016   BILITOT 1.2 11/23/2016     Microbiology: Recent Results (from the past 240 hour(s))  Blood Culture (routine x 2)     Status: Abnormal   Collection Time: 11/23/16  8:15 PM  Result Value Ref Range Status   Specimen Description BLOOD RIGHT ANTECUBITAL  Final   Special Requests   Final    BOTTLES DRAWN AEROBIC AND ANAEROBIC Blood Culture adequate volume   Culture  Setup Time   Final    GRAM POSITIVE COCCI IN PAIRS AND CHAINS IN BOTH AEROBIC AND ANAEROBIC BOTTLES CRITICAL VALUE NOTED.  VALUE IS CONSISTENT WITH PREVIOUSLY REPORTED AND CALLED VALUE.    Culture (A)  Final    STREPTOCOCCUS GALLOLYTICUS SUSCEPTIBILITIES PERFORMED ON PREVIOUS CULTURE WITHIN THE LAST 5 DAYS.    Report Status 11/26/2016 FINAL  Final  Blood Culture (routine  x 2)     Status: Abnormal   Collection Time: 11/23/16  8:22 PM  Result Value Ref Range Status   Specimen Description BLOOD RIGHT WRIST  Final   Special Requests   Final    BOTTLES DRAWN AEROBIC AND ANAEROBIC Blood Culture adequate volume   Culture  Setup Time   Final    GRAM POSITIVE COCCI IN PAIRS AND CHAINS IN BOTH AEROBIC AND ANAEROBIC BOTTLES CRITICAL RESULT CALLED  TO, READ BACK BY AND VERIFIED WITH: M. Radford Pax Pharm.D. 10:35 11/24/16 ([email or web address])    Culture STREPTOCOCCUS GALLOLYTICUS (A)  Final   Report Status 11/26/2016 FINAL  Final   Organism ID, Bacteria STREPTOCOCCUS GALLOLYTICUS  Final      Susceptibility   Streptococcus gallolyticus - MIC*    PENICILLIN 0.12 SENSITIVE Sensitive     CEFTRIAXONE <=0.12 SENSITIVE Sensitive     ERYTHROMYCIN INTERMEDIATE Intermediate     LEVOFLOXACIN 2 SENSITIVE Sensitive     VANCOMYCIN 0.5 SENSITIVE Sensitive     * STREPTOCOCCUS GALLOLYTICUS  Blood Culture ID Panel (Reflexed)     Status: Abnormal   Collection Time: 11/23/16  8:22 PM  Result Value Ref Range Status   Enterococcus species NOT DETECTED NOT DETECTED Final   Listeria monocytogenes NOT DETECTED NOT DETECTED Final   Staphylococcus species NOT DETECTED NOT DETECTED Final   Staphylococcus aureus NOT DETECTED NOT DETECTED Final   Streptococcus species DETECTED (A) NOT DETECTED Final    Comment: Not Enterococcus species, Streptococcus agalactiae, Streptococcus pyogenes, or Streptococcus pneumoniae. CRITICAL RESULT CALLED TO, READ BACK BY AND VERIFIED WITH: M. Radford Pax Pharm.D. 10:35 11/24/16 ([email or web address])    Streptococcus agalactiae NOT DETECTED NOT DETECTED Final   Streptococcus pneumoniae NOT DETECTED NOT DETECTED Final   Streptococcus pyogenes NOT DETECTED NOT DETECTED Final   Acinetobacter baumannii NOT DETECTED NOT DETECTED Final   Enterobacteriaceae species NOT DETECTED NOT DETECTED Final   Enterobacter cloacae complex NOT DETECTED NOT DETECTED Final   Escherichia coli NOT DETECTED NOT  DETECTED Final   Klebsiella oxytoca NOT DETECTED NOT DETECTED Final   Klebsiella pneumoniae NOT DETECTED NOT DETECTED Final   Proteus species NOT DETECTED NOT DETECTED Final   Serratia marcescens NOT DETECTED NOT DETECTED Final   Haemophilus influenzae NOT DETECTED NOT DETECTED Final   Neisseria meningitidis NOT DETECTED NOT DETECTED Final   Pseudomonas aeruginosa NOT DETECTED NOT DETECTED Final   Candida albicans NOT DETECTED NOT DETECTED Final   Candida glabrata NOT DETECTED NOT DETECTED Final   Candida krusei NOT DETECTED NOT DETECTED Final   Candida parapsilosis NOT DETECTED NOT DETECTED Final   Candida tropicalis NOT DETECTED NOT DETECTED Final  MRSA PCR Screening     Status: None   Collection Time: 11/24/16  3:25 AM  Result Value Ref Range Status   MRSA by PCR NEGATIVE NEGATIVE Final    Comment:        The GeneXpert MRSA Assay (FDA approved for NASAL specimens only), is one component of a comprehensive MRSA colonization surveillance program. It is not intended to diagnose MRSA infection nor to guide or monitor treatment for MRSA infections.   Culture, blood (routine x 2)     Status: None (Preliminary result)   Collection Time: 11/25/16 10:18 AM  Result Value Ref Range Status   Specimen Description BLOOD RIGHT WRIST  Final   Special Requests   Final    BOTTLES DRAWN AEROBIC AND ANAEROBIC Blood Culture adequate volume   Culture NO GROWTH 4 DAYS  Final   Report Status PENDING  Incomplete  Culture, blood (routine x 2)     Status: None (Preliminary result)   Collection Time: 11/25/16 10:18 AM  Result Value Ref Range Status   Specimen Description BLOOD RIGHT HAND  Final   Special Requests   Final    BOTTLES DRAWN AEROBIC AND ANAEROBIC Blood Culture adequate volume   Culture NO GROWTH 4 DAYS  Final   Report Status PENDING  Incomplete

## 2016-11-29 NOTE — Progress Notes (Addendum)
[facility] Progress Note  Subjective: up in chair today, no c/o  Vitals:   11/28/16 2026 11/28/16 2326 11/29/16 0808 11/29/16 1152  BP:  113/74 110/74 95/69  Pulse:  92 85 88  Resp:  (!) 23 (!) 22 14  Temp: 98.3 F (36.8 C) 97.6 F (36.4 C) (!) 97.5 F (36.4 C) 97.6 F (36.4 C)  TempSrc: Oral Oral Oral Oral  SpO2:  90% 96% 95%  Weight:      Height:        Inpatient medications: . atorvastatin  20 mg Oral Daily  . bromocriptine  5 mg Oral QHS  . calcium acetate  667 mg Oral TID WC  . cinacalcet  30 mg Oral QPM  . diltiazem  30 mg Oral Q12H  . dorzolamide-timolol  1 drop Both Eyes BID  . loratadine  10 mg Oral Daily  . mouth rinse  15 mL Mouth Rinse BID  . metoprolol tartrate  50 mg Oral BID  . midodrine  15 mg Oral Q M,W,F-HD  . pantoprazole  40 mg Oral Daily  . polyethylene glycol  17 g Oral Daily  . predniSONE  5 mg Oral Daily  . senna-docusate  1 tablet Oral BID  . sertraline  50 mg Oral Daily   . cefTRIAXone (ROCEPHIN)  IV Stopped (11/28/16 1913)  . gentamicin Stopped (11/26/16 1522)  . methocarbamol (ROBAXIN)  IV Stopped (11/27/16 1209)   acetaminophen, bisacodyl, bisacodyl, ketorolac, methocarbamol (ROBAXIN)  IV, morphine injection, MUSCLE RUB, ondansetron **OR** ondansetron (ZOFRAN) IV  Exam: NAD, elderly male No jvd Chest clear bilat RRR irreg irreg rhythm, soft murmur Abd soft ntnd no ascites Ext no edema Neuro is NF, Ox 3   CT chest - negative ECHO - no vegetation CT neck - spondylitic changes, no discitis/ abscess  Dialysis: [facility] MWF 4h   88.5kg   Hep 2800  LFA AVF -mircera 50 mg last on 8/15, last Hb 12 on 8/22      Impression: 1  ESRD  - HD MWF 2  Strep gallolyticus bacteremia - w/u in progress, for TEE tomorrow am 3  AFib w/ RVR - on coumadin, dilt/ MTP po 4  Chron hypotension - on midodrine 15 pre HD tiw 5  DM2 6  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/ mac degen 7  Depression on Zoloft 8  Hist of TAVRS (June 2018) 9  SP PPM 10 Cleora Fleet -  demand ischem 11  Vol - is at dry wt, stable exam  Plan - HD tomorrow, 2nd shift, min UF   Kelly Splinter MD [facility] pager [phone]   11/29/2016, 12:57 PM    Recent Labs Lab 11/24/16 2326 11/25/16 0513 11/26/16 0138 11/28/16 0346  NA 134* 137 136 134*  K 4.0 3.5 3.7 4.2  CL 97* 100* 96* 94*  CO2 22 24 26 25   GLUCOSE 170* 121* 140* 108*  BUN 68* 24* 45* 49*  CREATININE 8.95* 4.09* 5.77* 6.13*  CALCIUM 7.7* 8.0* 8.2* 8.2*  PHOS 4.4 3.5 3.5  --     Recent Labs Lab 11/23/16 1936  11/24/16 2326 11/25/16 0513 11/26/16 0138  AST 55*  --   --   --   --   ALT 37  --   --   --   --   ALKPHOS 70  --   --   --   --   BILITOT 1.2  --   --   --   --   PROT 6.9  --   --   --   --  ALBUMIN 3.0*  < > 2.5* 2.5* 2.4*  < > = values in this interval not displayed.  Recent Labs Lab 11/24/16 0312  11/25/16 0513 11/26/16 0138 11/28/16 0346  WBC 13.1*  < > 14.0* 12.9* 10.9*  NEUTROABS 11.5*  --  12.8* 11.6*  --   HGB 10.0*  < > 9.9* 10.1* 10.7*  HCT 31.1*  < > 29.8* 31.3* 32.5*  MCV 96.6  < > 94.9 94.8 93.7  PLT 103*  < > 133* 166 195  < > = values in this interval not displayed. Iron/TIBC/Ferritin/ %Sat No results found for: IRON, TIBC, FERRITIN, IRONPCTSAT

## 2016-11-29 NOTE — Progress Notes (Signed)
PROGRESS NOTE    Alexander [name]  JIR:[id number] DOB: 12-18-1940 DOA: 11/23/2016 PCP: Deland Pretty, MD    Brief Narrative:  Alexander [name] is a 76 y.o. male with medical history significant of anemia, aortic stenosis, PUD, GERD, Barrett's esophagus, carpal tunnel syndrome, paroxysmal atrial fibrillation, history of bradycardia (S/Pdual lead pacemaker placement), CVA, depression, type 2 diabetes, diabetic peripheral neuropathy, diverticulosis, ESRD on hemodialysis, glaucoma, hyperlipidemia, hypertension, nephrolithiasis, macular degeneration/legally blind, seasonal allergies, tubular adenoma of the colon who is coming to the emergency department generalized body aches and malaise, muscle spasms of all extremities since monday after he finished hemodialysis. He was admitted for evaluation of meningitis, to step down. Meanwhile he developed a fib with RVR and cardizem gtt was started.   On 8/ 29 afternoon, pt became encephalopathic after he received 2.5 mg of valium , became unresponsive and hypoxic. Dr Florene Glen ordered flumazenil and he reverted back to baseline. Valium was discontinued.  Blood cultures from both aerobic and anaerobic bottles are positive for streptococcus species.  ID Dr Megan Salon consulted for appropriate antibiotics.  8/31 neck pain persistent, PT eval ordered. Cardiology for TEE.  9/1 right hip pain, CT of the right hip ordered negative for acute pathology.   Assessment & Plan:   Principal Problem:   Streptococcal bacteremia Active Problems:   Anemia, unspecified   Severe aortic stenosis   PAD (peripheral artery disease) (HCC)   ESRD (end stage renal disease) on dialysis (HCC)   Hyperlipidemia   Diabetic peripheral neuropathy (HCC)   S/P TAVR (transcatheter aortic valve replacement)   Atrial fibrillation with RVR (HCC)   Glaucoma   GERD (gastroesophageal reflux disease)   Hypomagnesemia   Type 2 diabetes mellitus (HCC)   Depression   SIRS (systemic inflammatory  response syndrome) (HCC)   Presence of permanent cardiac pacemaker  Generalized malaise, neck pain/  mild rhabdomyolysis.  Sepsis:from streptococcal bacteremia.  Suspected all this is due to STREPTOCOCCUS GALLOLYTICUS bacteremia. CT head without contrast showed old infarcts, repeat CT of the head was also negative for acute pathology, CT neck shows spondylosis.  CT chest without contrast does not show any pneumonia.  Started on IV meropenem and vancomycin, changed to rocephin and gentamicin.  ID consulted, recommended getting TTE and possible repeat CT cervical spine with contrast, discussed with DR Jonnie Finner,  Radiology and patient's daughter and ordered repeat CT cervical spine with contrast to evaluate for discitis and epidural abscess, which was negative for abscess.  Currently afebrile. Leukocytosis with left shift seen, but improving.  Continue with the same management as above.  Echocardiogram ordered and it did not show endocarditis. Cardiology for TEE, possibly next week after the holiday.  Repeat blood cultures have been negative so far.  No change in medications.      Afib with RVR, pt has a h/o PAF,: Rate not well controlled, increased the dose of metoprolol to 50 mg BID. Was on coumadin and his INR IS still supra therapeutic. Patient started to have nose bleeds. Ordered 5 mg vitamin K.    Elevated troponin's; From demand ischemia from afib with RVR.  Pt denies any chest pain.   ESRD on HD:  Nephrologist on board . Further management  As per renal.    Anemia of chronic disease:  Hemoglobin around 10. Monitor.   TYPE 2 DM:  CBG (last 3)   Recent Labs  11/28/16 2200 11/29/16 0806 11/29/16 1149  GLUCAP 144* 83 109*    Resume SSI. No change in medications.  H/o severe AS, s/p TAVR:  Was on coumadin at home, INR supra therapeutic till today. Had nose bleeds all day , had to give him vitamin K, and his INR is sub therapeutic.  Pharmacy to dose Coumadin  GERD:    Resume PPI.    HYPOTENSION; on midodrine.  Asymptomatic.   Leukocytosis: suspect its from the bacteremia. Improving.   Right hip pain: CT of the right hip ordered by ID and is negative for acute pathology.  - pain control with IV toradol.    DVT prophylaxis:heparin sq Code Status: (Full code) Family Communication: daughter  at bedside.  Disposition Plan: SNF in 1 to 2 days after evaluation by TEE.    Consultants:   Nephrology  ID  PCCM over the phone.   Procedures: HD  Antimicrobials: MEROPENAM till 8/29.  Gentamicin from 8/30 vancomycin from admission till 8/31 Rocephin from 8/31. .   Subjective: Didn't sleep last night and has been sleeping all day.   Objective: Vitals:   11/29/16 1525 11/29/16 1535 11/29/16 1600 11/29/16 1630  BP: 118/77 112/71 123/79 124/89  Pulse: 100 81 91 (!) 106  Resp: (!) 23 (!) 22 (!) 23 (!) 25  Temp: (!) 97.4 F (36.3 C)     TempSrc: Oral     SpO2: 98% 98% 95% 93%  Weight: 90 kg (198 lb 6.6 oz)     Height:        Intake/Output Summary (Last 24 hours) at 11/29/16 1713 Last data filed at 11/29/16 0858  Gross per 24 hour  Intake              450 ml  Output              100 ml  Net              350 ml   Filed Weights   11/25/16 1234 11/27/16 1246 11/29/16 1525  Weight: 94.8 kg (209 lb) 88.1 kg (194 lb 3.6 oz) 90 kg (198 lb 6.6 oz)    Examination:  General exam:  Alert and comfortable. Not in any distress.  Respiratory system: good air entry at bases. No wheezing or rhonchi.  Cardiovascular system: rate control, s1s2, irregular. No pedal edema. No JVD.  Gastrointestinal system: Abdomen  Soft non tender non distended bowel sounds heard.  Central nervous system: Alert and oriented. No new deficits.  Extremities: Symmetric 5 x 5 power. No cyanosis or clubbing.  Skin: No rashes, lesions or ulcers Psychiatry: Mood & affect appropriate.     Data Reviewed: I have personally reviewed following labs and imaging  studies  CBC:  Recent Labs Lab 11/24/16 0312 11/24/16 2326 11/25/16 0513 11/26/16 0138 11/28/16 0346 11/29/16 1648  WBC 13.1* 13.6* 14.0* 12.9* 10.9* 10.3  NEUTROABS 11.5*  --  12.8* 11.6*  --   --   HGB 10.0* 9.7* 9.9* 10.1* 10.7* 10.0*  HCT 31.1* 29.9* 29.8* 31.3* 32.5* 30.5*  MCV 96.6 95.2 94.9 94.8 93.7 93.6  PLT 103* 124* 133* 166 195 785   Basic Metabolic Panel:  Recent Labs Lab 11/23/16 1936 11/24/16 0604 11/24/16 2326 11/25/16 0513 11/26/16 0138 11/28/16 0346  NA  --  136 134* 137 136 134*  K  --  4.2 4.0 3.5 3.7 4.2  CL  --  98* 97* 100* 96* 94*  CO2  --  24 22 24 26 25   GLUCOSE  --  174* 170* 121* 140* 108*  BUN  --  50* 68* 24* 45* 49*  CREATININE  --  8.15* 8.95* 4.09* 5.77* 6.13*  CALCIUM  --  7.9* 7.7* 8.0* 8.2* 8.2*  MG 1.5*  --   --   --   --   --   PHOS  --  4.1 4.4 3.5 3.5  --    GFR: Estimated Creatinine Clearance: 11.7 mL/min (A) (by C-G formula based on SCr of 6.13 mg/dL (H)). Liver Function Tests:  Recent Labs Lab 11/23/16 1936 11/24/16 0604 11/24/16 2326 11/25/16 0513 11/26/16 0138  AST 55*  --   --   --   --   ALT 37  --   --   --   --   ALKPHOS 70  --   --   --   --   BILITOT 1.2  --   --   --   --   PROT 6.9  --   --   --   --   ALBUMIN 3.0* 2.6* 2.5* 2.5* 2.4*   No results for input(s): LIPASE, AMYLASE in the last 168 hours. No results for input(s): AMMONIA in the last 168 hours. Coagulation Profile:  Recent Labs Lab 11/26/16 0138 11/27/16 0227 11/28/16 0346 11/28/16 2327 11/29/16 0539  INR 6.38* 5.47* 8.84* 1.81 1.55   Cardiac Enzymes:  Recent Labs Lab 11/23/16 1936 11/24/16 0312 11/24/16 0604  CKTOTAL 630*  --   --   TROPONINI 0.09* 0.07* 0.07*   BNP (last 3 results)  Recent Labs  11/11/16 1143  PROBNP 18,028*   HbA1C: No results for input(s): HGBA1C in the last 72 hours. CBG:  Recent Labs Lab 11/28/16 1232 11/28/16 1702 11/28/16 2200 11/29/16 0806 11/29/16 1149  GLUCAP 96 139* 144* 83 109*    Lipid Profile: No results for input(s): CHOL, HDL, LDLCALC, TRIG, CHOLHDL, LDLDIRECT in the last 72 hours. Thyroid Function Tests: No results for input(s): TSH, T4TOTAL, FREET4, T3FREE, THYROIDAB in the last 72 hours. Anemia Panel: No results for input(s): VITAMINB12, FOLATE, FERRITIN, TIBC, IRON, RETICCTPCT in the last 72 hours. Sepsis Labs:  Recent Labs Lab 11/23/16 1951  LATICACIDVEN 1.23    Recent Results (from the past 240 hour(s))  Blood Culture (routine x 2)     Status: Abnormal   Collection Time: 11/23/16  8:15 PM  Result Value Ref Range Status   Specimen Description BLOOD RIGHT ANTECUBITAL  Final   Special Requests   Final    BOTTLES DRAWN AEROBIC AND ANAEROBIC Blood Culture adequate volume   Culture  Setup Time   Final    GRAM POSITIVE COCCI IN PAIRS AND CHAINS IN BOTH AEROBIC AND ANAEROBIC BOTTLES CRITICAL VALUE NOTED.  VALUE IS CONSISTENT WITH PREVIOUSLY REPORTED AND CALLED VALUE.    Culture (A)  Final    STREPTOCOCCUS GALLOLYTICUS SUSCEPTIBILITIES PERFORMED ON PREVIOUS CULTURE WITHIN THE LAST 5 DAYS.    Report Status 11/26/2016 FINAL  Final  Blood Culture (routine x 2)     Status: Abnormal   Collection Time: 11/23/16  8:22 PM  Result Value Ref Range Status   Specimen Description BLOOD RIGHT WRIST  Final   Special Requests   Final    BOTTLES DRAWN AEROBIC AND ANAEROBIC Blood Culture adequate volume   Culture  Setup Time   Final    GRAM POSITIVE COCCI IN PAIRS AND CHAINS IN BOTH AEROBIC AND ANAEROBIC BOTTLES CRITICAL RESULT CALLED TO, READ BACK BY AND VERIFIED WITH: Truett Perna.D. 10:35 11/24/16 ([email or web address])    Culture STREPTOCOCCUS GALLOLYTICUS (A)  Final   Report Status 11/26/2016 FINAL  Final   Organism ID, Bacteria STREPTOCOCCUS GALLOLYTICUS  Final      Susceptibility   Streptococcus gallolyticus - MIC*    PENICILLIN 0.12 SENSITIVE Sensitive     CEFTRIAXONE <=0.12 SENSITIVE Sensitive     ERYTHROMYCIN INTERMEDIATE Intermediate     LEVOFLOXACIN 2  SENSITIVE Sensitive     VANCOMYCIN 0.5 SENSITIVE Sensitive     * STREPTOCOCCUS GALLOLYTICUS  Blood Culture ID Panel (Reflexed)     Status: Abnormal   Collection Time: 11/23/16  8:22 PM  Result Value Ref Range Status   Enterococcus species NOT DETECTED NOT DETECTED Final   Listeria monocytogenes NOT DETECTED NOT DETECTED Final   Staphylococcus species NOT DETECTED NOT DETECTED Final   Staphylococcus aureus NOT DETECTED NOT DETECTED Final   Streptococcus species DETECTED (A) NOT DETECTED Final    Comment: Not Enterococcus species, Streptococcus agalactiae, Streptococcus pyogenes, or Streptococcus pneumoniae. CRITICAL RESULT CALLED TO, READ BACK BY AND VERIFIED WITH: M. Radford Pax Pharm.D. 10:35 11/24/16 ([email or web address])    Streptococcus agalactiae NOT DETECTED NOT DETECTED Final   Streptococcus pneumoniae NOT DETECTED NOT DETECTED Final   Streptococcus pyogenes NOT DETECTED NOT DETECTED Final   Acinetobacter baumannii NOT DETECTED NOT DETECTED Final   Enterobacteriaceae species NOT DETECTED NOT DETECTED Final   Enterobacter cloacae complex NOT DETECTED NOT DETECTED Final   Escherichia coli NOT DETECTED NOT DETECTED Final   Klebsiella oxytoca NOT DETECTED NOT DETECTED Final   Klebsiella pneumoniae NOT DETECTED NOT DETECTED Final   Proteus species NOT DETECTED NOT DETECTED Final   Serratia marcescens NOT DETECTED NOT DETECTED Final   Haemophilus influenzae NOT DETECTED NOT DETECTED Final   Neisseria meningitidis NOT DETECTED NOT DETECTED Final   Pseudomonas aeruginosa NOT DETECTED NOT DETECTED Final   Candida albicans NOT DETECTED NOT DETECTED Final   Candida glabrata NOT DETECTED NOT DETECTED Final   Candida krusei NOT DETECTED NOT DETECTED Final   Candida parapsilosis NOT DETECTED NOT DETECTED Final   Candida tropicalis NOT DETECTED NOT DETECTED Final  MRSA PCR Screening     Status: None   Collection Time: 11/24/16  3:25 AM  Result Value Ref Range Status   MRSA by PCR NEGATIVE NEGATIVE Final     Comment:        The GeneXpert MRSA Assay (FDA approved for NASAL specimens only), is one component of a comprehensive MRSA colonization surveillance program. It is not intended to diagnose MRSA infection nor to guide or monitor treatment for MRSA infections.   Culture, blood (routine x 2)     Status: None (Preliminary result)   Collection Time: 11/25/16 10:18 AM  Result Value Ref Range Status   Specimen Description BLOOD RIGHT WRIST  Final   Special Requests   Final    BOTTLES DRAWN AEROBIC AND ANAEROBIC Blood Culture adequate volume   Culture NO GROWTH 4 DAYS  Final   Report Status PENDING  Incomplete  Culture, blood (routine x 2)     Status: None (Preliminary result)   Collection Time: 11/25/16 10:18 AM  Result Value Ref Range Status   Specimen Description BLOOD RIGHT HAND  Final   Special Requests   Final    BOTTLES DRAWN AEROBIC AND ANAEROBIC Blood Culture adequate volume   Culture NO GROWTH 4 DAYS  Final   Report Status PENDING  Incomplete         Radiology Studies: No results found.      Scheduled Meds: . atorvastatin  20 mg Oral Daily  . bromocriptine  5 mg Oral  QHS  . calcium acetate  667 mg Oral TID WC  . cinacalcet  30 mg Oral QPM  . diltiazem  30 mg Oral Q12H  . dorzolamide-timolol  1 drop Both Eyes BID  . loratadine  10 mg Oral Daily  . mouth rinse  15 mL Mouth Rinse BID  . metoprolol tartrate  50 mg Oral BID  . midodrine  15 mg Oral Q M,W,F-HD  . pantoprazole  40 mg Oral Daily  . polyethylene glycol  17 g Oral Daily  . predniSONE  5 mg Oral Daily  . sertraline  50 mg Oral Daily  . warfarin  5 mg Oral ONCE-1800  . Warfarin - Pharmacist Dosing Inpatient   Does not apply q1800   Continuous Infusions: . sodium chloride    . sodium chloride    . cefTRIAXone (ROCEPHIN)  IV Stopped (11/28/16 1913)  . gentamicin Stopped (11/26/16 1522)  . methocarbamol (ROBAXIN)  IV Stopped (11/27/16 1209)     LOS: 6 days    Time spent: 35 minutes.     Hosie Poisson, MD [facility] Pager [phone]   If 7PM-7AM, please contact night-coverage [email or web address] Password [email or web address] 11/29/2016, 5:13 PM

## 2016-11-29 NOTE — Progress Notes (Signed)
ANTICOAGULATION CONSULT NOTE - Initial Consult  Pharmacy Consult for warfarin Indication: atrial fibrillation and stroke  Allergies  Allergen Reactions  . Penicillins Swelling    SWELLING REACTION UNSPECIFIED   PATIENT HAD A PCN REACTION WITH IMMEDIATE RASH, FACIAL/TONGUE/THROAT SWELLING, SOB, OR LIGHTHEADEDNESS WITH HYPOTENSION:  #  #  #  YES  #  #  #   Has patient had a PCN reaction causing severe rash involving mucus membranes or skin necrosis: No Has patient had a PCN reaction that required hospitalization No Has patient had a PCN reaction occurring within the last 10 years: No If all of the above answers are "NO", then may proceed with Cephalosporin use.  . Codeine Nausea Only  . Tramadol Nausea Only    Patient Measurements: Height: 5' 10.5" (179.1 cm) Weight: 194 lb 3.6 oz (88.1 kg) IBW/kg (Calculated) : 74.15  Vital Signs: Temp: 97.6 F (36.4 C) (09/03 1152) Temp Source: Oral (09/03 1152) BP: 95/69 (09/03 1152) Pulse Rate: 88 (09/03 1152)  Labs:  Recent Labs  11/28/16 0346 11/28/16 2327 11/29/16 0539  HGB 10.7*  --   --   HCT 32.5*  --   --   PLT 195  --   --   LABPROT 71.9* 20.9* 18.4*  INR 8.84* 1.81 1.55  CREATININE 6.13*  --   --     Estimated Creatinine Clearance: 10.8 mL/min (A) (by C-G formula based on SCr of 6.13 mg/dL (H)).   Medical History: Past Medical History:  Diagnosis Date  . Anemia   . Aortic stenosis 06/15/12   TEE - EF 28-76%; grade 1 diastolic dysfunction; mild/mod aortic valve stenosis; Mitral valve had calcified annulus, mild pulm htn PA peak pressure 61mmHg  . Barrett's esophagus 05/2003  . Bradycardia 2017   [employer] 2240 Assurity dual-lead pacemaker  . Carpal tunnel syndrome, bilateral 11/03/2015  . Colon polyps   . CVA (cerebral infarction)    2004/affected left side  . Depression   . Diabetes mellitus without complication ([place])   . Diabetic peripheral neuropathy ([place]) 10/02/2015  . Diverticulosis   . Dyspnea    with exertion  . End stage renal disease ([place])    hemodialysis 3 times a week  . GERD (gastroesophageal reflux disease)   . Glaucoma   . Hyperlipidemia   . Hypertension   . Kidney stones   . Legally blind   . Macular degeneration    both eyes  . Orthostatic hypotension 09/09/2015  . Paroxysmal atrial fibrillation (HCC)   . Peptic ulcer    bleeding, 1969  . Presence of permanent cardiac pacemaker   . S/P epidural steroid injection    last  injection over 10 years ago  . Seasonal allergies   . Tubular adenoma of colon 07/2001    Assessment: 76 year old male with history of afib and cva on chronic coumadin prior to admission. INR has been elevated since admission and peaked at 7. Patient had nose bleed yesterday and 5mg  of vitamin k was given total. INR is now down to 1.5 this am and pharmacy has been asked to restart his warfarin.  Nose bleed resolved, hgb stable 10s.  Patient was taking 5mg  daily of coumadin prior to admission, will restart this tonight.  Goal of Therapy:  INR 2-3 Monitor platelets by anticoagulation protocol: Yes   Plan:  Warfarin 5mg  tonight Daily INR  Erin Hearing PharmD., BCPS Clinical Pharmacist Pager [phone] 11/29/2016 2:27 PM

## 2016-11-29 NOTE — Evaluation (Signed)
Physical Therapy Evaluation Patient Details Name: Alexander [name] MRN: [id number] DOB: 05/14/1940 Today's Date: 11/29/2016   History of Present Illness  Alexander [name] is a 76 y.o. male with medical history significant of anemia, aortic stenosis, PUD, GERD, Barrett's esophagus, carpal tunnel syndrome, paroxysmal atrial fibrillation, history of bradycardia (S/Pdual lead pacemaker placement), CVA, depression, type 2 diabetes, diabetic peripheral neuropathy, diverticulosis, ESRD on hemodialysis, glaucoma, hyperlipidemia, hypertension, nephrolithiasis, macular degeneration/legally blind, seasonal allergies, tubular adenoma of the colon who is coming to the emergency department generalized body aches and malaise, muscle spasms of all extremities since monday after he finished hemodialysis. He was admitted for evaluation of meningitis, to step down. Meanwhile he developed a fib with RVR. Blood cultures positive pr streptococcus bacteremia.  Clinical Impression  Pt admitted with above diagnosis. Pt currently with functional limitations due to the deficits listed below (see PT Problem List). Pt walked for the first time in several days, 15' with RW and min A progressing to mod A with pt fatigue. Pt with strong left lean away from R hip pain. R hip pain begins in R lumbar region and moves into R hip.  Pt will benefit from skilled PT to increase their independence and safety with mobility to allow discharge to the venue listed below.       Follow Up Recommendations SNF;Supervision/Assistance - 24 hour    Equipment Recommendations  None recommended by PT    Recommendations for Other Services       Precautions / Restrictions Precautions Precautions: Fall Precaution Comments: pt is legally blind Restrictions Weight Bearing Restrictions: No      Mobility  Bed Mobility               General bed mobility comments: pt received sitting on BSC with NT  Transfers Overall transfer level: Needs  assistance Equipment used: Rolling walker (2 wheeled) Transfers: Sit to/from [facility] Sit to Stand: Min assist Stand pivot transfers: Min assist       General transfer comment: vc's for hand placement with each transfer. Min A to steady and guide pt's hand to RW. Pt with posterior and left lean  Ambulation/Gait Ambulation/Gait assistance: Min assist;Mod assist Ambulation Distance (Feet): 15 Feet Assistive device: Rolling walker (2 wheeled) Gait Pattern/deviations: Step-through pattern;Shuffle;Decreased stride length Gait velocity: decreased Gait velocity interpretation: <1.8 ft/sec, indicative of risk for recurrent falls General Gait Details: pt with persistent left lean, needing min A to correct 1st 12', then mod A last 3', as R hip pain increased. Pt c/o neck and hip pain in standing.   Stairs            Wheelchair Mobility    Modified Rankin (Stroke Patients Only)       Balance Overall balance assessment: Needs assistance Sitting-balance support: Single extremity supported Sitting balance-Leahy Scale: Poor Sitting balance - Comments: pt needed to have something to hold with R hand to maintain sitting, otherwise LOB to left Postural control: Left lateral lean;Posterior lean Standing balance support: Bilateral upper extremity supported Standing balance-Leahy Scale: Poor Standing balance comment: needed UE support to stand                             Pertinent Vitals/Pain Pain Assessment: Faces Faces Pain Scale: Hurts whole lot Pain Location: neck and hips R>L Pain Descriptors / Indicators: Aching;Cramping;Grimacing;Guarding Pain Intervention(s): Limited activity within patient's tolerance;Monitored during session;Heat applied         HR  114 bpm with ambulation        O2 sats upper 90's with mobility but drop into mid 80's when pt sitting and gets sleepy  Home Living Family/patient expects to be discharged to:: Skilled nursing  facility Living Arrangements: Spouse/significant other Available Help at Discharge: Available PRN/intermittently Type of Home: House         Home Equipment: Gilford Rile - 2 wheels Additional Comments: pt's wife has been his primary caregiver but she has recently started treatment for breast cancer and cannot be with all of the time. One daughter is out of town, one works full time, and the 3rd will need to help mom and dad    Prior Function Level of Independence: Needs assistance   Gait / Transfers Assistance Needed: ambulates short distances with RW  ADL's / Homemaking Assistance Needed: wife helps with bathing and dressing as needed        Hand Dominance        Extremity/Trunk Assessment   Upper Extremity Assessment Upper Extremity Assessment: Generalized weakness    Lower Extremity Assessment Lower Extremity Assessment: Generalized weakness    Cervical / Trunk Assessment Cervical / Trunk Assessment: Kyphotic;Other exceptions Cervical / Trunk Exceptions: pt has neck pain with neck motion in all planes. Tenderness to palpation R and L and spasms noted when pt trying to turn head. Gave him gentle AROM exercises to be performed in supine. Applied heat to neck after session.   Communication   Communication: Expressive difficulties  Cognition Arousal/Alertness: Lethargic Behavior During Therapy: WFL for tasks assessed/performed Overall Cognitive Status: History of cognitive impairments - at baseline                                 General Comments: pt's daughter reports that he was desating through the night so they placed him on O2. He is on RA now but when he became lethargic noted that O2 sats dropped down in mid 80's. Daughter feels that he has probably needed CPAP at home for a long time.       General Comments General comments (skin integrity, edema, etc.): Hip pain seems to be originating in R lumbar region and wrapping around into R hip. He reports that  pain began on his way to the hospital and was not present before that time.     Exercises General Exercises - Lower Extremity Ankle Circles/Pumps: AROM;Both;10 reps;Seated   Assessment/Plan    PT Assessment Patient needs continued PT services  PT Problem List Decreased strength;Decreased range of motion;Decreased activity tolerance;Decreased balance;Decreased mobility;Decreased cognition;Decreased knowledge of use of DME;Decreased knowledge of precautions;Pain       PT Treatment Interventions DME instruction;Gait training;Functional mobility training;Therapeutic activities;Therapeutic exercise;Balance training;Patient/family education    PT Goals (Current goals can be found in the Care Plan section)  Acute Rehab PT Goals Patient Stated Goal: return home PT Goal Formulation: With patient/family Time For Goal Achievement: 12/13/16 Potential to Achieve Goals: Fair    Frequency Min 2X/week   Barriers to discharge Decreased caregiver support      Co-evaluation               AM-PAC PT "6 Clicks" Daily Activity  Outcome Measure Difficulty turning over in bed (including adjusting bedclothes, sheets and blankets)?: Unable Difficulty moving from lying on back to sitting on the side of the bed? : Unable Difficulty sitting down on and standing up from a chair with arms (e.g.,  wheelchair, bedside commode, etc,.)?: Unable Help needed moving to and from a bed to chair (including a wheelchair)?: A Little Help needed walking in hospital room?: A Lot Help needed climbing 3-5 steps with a railing? : Total 6 Click Score: 9    End of Session Equipment Utilized During Treatment: Gait belt Activity Tolerance: Patient limited by pain;Patient limited by fatigue Patient left: in chair;with call bell/phone within reach;with family/visitor present Nurse Communication: Mobility status PT Visit Diagnosis: Unsteadiness on feet (R26.81);Muscle weakness (generalized) (M62.81);Difficulty in walking,  not elsewhere classified (R26.2);Pain Pain - Right/Left: Right Pain - part of body: Hip (neck)    Time: 5916-3846 PT Time Calculation (min) (ACUTE ONLY): 32 min   Charges:   PT Evaluation $PT Eval Moderate Complexity: 1 Mod PT Treatments $Gait Training: 8-22 mins   PT G Codes:        Leighton Roach, PT  Acute Rehab Services  [place] 11/29/2016, 9:45 AM

## 2016-11-29 NOTE — Progress Notes (Signed)
Pharmacy Antibiotic Note  Alexander [name] is a 76 y.o. male admitted on 11/23/2016 with rule out meningitis .   Ceftriaxone and Gentamicin to cover for strep gallolyticus bacteremia, repeat cultures from 8/30 are negative.  No fevers overnight, wbc 10.9, Gent peak this am was within range for synergy at 3.9. So will continue 70mg  qHD, scheduled for HD today. No dose adjustments needed to ceftriaxone.  Plan: Gentamicin 70mg  IV qHD MWF  No changes to Ceftriaxone  Height: 5' 10.5" (179.1 cm) Weight: 194 lb 3.6 oz (88.1 kg) IBW/kg (Calculated) : 74.15 kg    Temp (24hrs), Avg:97.8 F (36.6 C), Min:97.5 F (36.4 C), Max:98.3 F (36.8 C)   Recent Labs Lab 11/23/16 1951 11/24/16 0947 11/24/16 0604 11/24/16 2326 11/25/16 0513 11/26/16 0138 11/28/16 0346 11/29/16 0539  WBC  --  13.1*  --  13.6* 14.0* 12.9* 10.9*  --   CREATININE  --   --  8.15* 8.95* 4.09* 5.77* 6.13*  --   LATICACIDVEN 1.23  --   --   --   --   --   --   --   GENTPEAK  --   --   --   --   --   --   --  3.9*    Estimated Creatinine Clearance: 10.8 mL/min (A) (by C-G formula based on SCr of 6.13 mg/dL (H)).    Allergies  Allergen Reactions  . Penicillins Swelling    SWELLING REACTION UNSPECIFIED   PATIENT HAD A PCN REACTION WITH IMMEDIATE RASH, FACIAL/TONGUE/THROAT SWELLING, SOB, OR LIGHTHEADEDNESS WITH HYPOTENSION:  #  #  #  YES  #  #  #   Has patient had a PCN reaction causing severe rash involving mucus membranes or skin necrosis: No Has patient had a PCN reaction that required hospitalization No Has patient had a PCN reaction occurring within the last 10 years: No If all of the above answers are "NO", then may proceed with Cephalosporin use.  . Codeine Nausea Only  . Tramadol Nausea Only   Vanc 8/28 >>8/31 Merrem 8/29 >>8/29 Cefepime 8/28 >>8/28 Gent 8/29>> CTX 8/31>>  9/3 Gent peak>>3.9 (goal 3-4)  8/28 Blood: strep gallolyticus 8/30 bld x2: ngtd  Erin Hearing PharmD., BCPS Clinical  Pharmacist Pager [phone] 11/29/2016 10:50 AM

## 2016-11-30 ENCOUNTER — Ambulatory Visit: Payer: Self-pay | Admitting: *Deleted

## 2016-11-30 ENCOUNTER — Telehealth: Payer: Self-pay | Admitting: Cardiovascular Disease

## 2016-11-30 ENCOUNTER — Other Ambulatory Visit: Payer: Self-pay | Admitting: *Deleted

## 2016-11-30 DIAGNOSIS — R7881 Bacteremia: Secondary | ICD-10-CM

## 2016-11-30 DIAGNOSIS — B954 Other streptococcus as the cause of diseases classified elsewhere: Secondary | ICD-10-CM

## 2016-11-30 DIAGNOSIS — N186 End stage renal disease: Secondary | ICD-10-CM

## 2016-11-30 DIAGNOSIS — Z950 Presence of cardiac pacemaker: Secondary | ICD-10-CM

## 2016-11-30 DIAGNOSIS — Z992 Dependence on renal dialysis: Secondary | ICD-10-CM

## 2016-11-30 DIAGNOSIS — Z952 Presence of prosthetic heart valve: Secondary | ICD-10-CM

## 2016-11-30 LAB — CULTURE, BLOOD (ROUTINE X 2)
CULTURE: NO GROWTH
Culture: NO GROWTH
Special Requests: ADEQUATE
Special Requests: ADEQUATE

## 2016-11-30 LAB — PROTIME-INR
INR: 1.44
Prothrombin Time: 17.5 seconds — ABNORMAL HIGH (ref 11.4–15.2)

## 2016-11-30 LAB — GLUCOSE, CAPILLARY
GLUCOSE-CAPILLARY: 172 mg/dL — AB (ref 65–99)
Glucose-Capillary: 80 mg/dL (ref 65–99)
Glucose-Capillary: 138 mg/dL — ABNORMAL HIGH (ref 65–99)
Glucose-Capillary: 167 mg/dL — ABNORMAL HIGH (ref 65–99)

## 2016-11-30 MED ORDER — WARFARIN SODIUM 5 MG PO TABS
5.0000 mg | ORAL_TABLET | Freq: Once | ORAL | Status: AC
  Administered 2016-11-30: 5 mg via ORAL
  Filled 2016-11-30: qty 1

## 2016-11-30 NOTE — Clinical Social Work Note (Addendum)
Clinical Social Work Assessment  Patient Details  Name: Alexander [name] MRN: [id number] Date of Birth: 06-11-1940  Date of referral:  11/30/16               Reason for consult:  Facility Placement, Discharge Planning                Permission sought to share information with:  Facility Sport and exercise psychologist, Family Supports Permission granted to share information::  Yes, Verbal Permission Granted  Name::     [place]  Agency::  SNF's  Relationship::  Wife  Contact Information:  [phone]  Housing/Transportation Living arrangements for the past 2 months:  Single Family Home Source of Information:  Patient, Medical Team, Spouse Patient Interpreter Needed:  None Criminal Activity/Legal Involvement Pertinent to Current Situation/Hospitalization:  No - Comment as needed Significant Relationships:  Adult Children, Spouse Lives with:  Spouse Do you feel safe going back to the place where you live?  Yes Need for family participation in patient care:  Yes (Comment)  Care giving concerns:  PT recommending SNF once medically stable for discharge.   Social Worker assessment / plan:  CSW met with patient. Wife at bedside. CSW introduced role and explained that PT recommendations would be discussed. Patient and his wife agreeable to SNF placement. First preferences are [facility] and [facility]. Patient's wife asked about transportation to dialysis. CSW explained that some facilities transport while others use [facility]. Patient's wife stated that they are already connected with [facility]. Patient's PASARR is under manual review. Patient cannot discharge to SNF until PASARR obtained. No further concerns CSW encouraged patient and his wife to contact CSW as needed. CSW will continue to follow patient and his wife for support and facilitate discharge to SNF once medically stable.  Employment status:  Retired Forensic scientist:  Medicare PT Recommendations:  [place] / Referral to community resources:  [place]  Patient/Family's Response to care:  Patient and his wife agreeable to SNF placement. Patient's family supportive and involved in patient's care. Patient and his wife appear happy with hospital care.  Patient/Family's Understanding of and Emotional Response to Diagnosis, Current Treatment, and Prognosis:  Patient oriented to self only. Patient's wife has a good understanding of the reason for admission and his need for rehab prior to returning home. Patient discussed plan for TEE at 9:00 tomorrow morning. Patient and his wife appear happy with hospital care.  Emotional Assessment Appearance:  Appears stated age Attitude/Demeanor/Rapport:  Other (Pleasant) Affect (typically observed):  Accepting, Appropriate, Calm, Pleasant Orientation:  Oriented to Self Alcohol / Substance use:  Never Used Psych involvement (Current and /or in the community):  No (Comment)  Discharge Needs  Concerns to be addressed:  Care Coordination Readmission within the last 30 days:  No Current discharge risk:  Dependent with Mobility Barriers to Discharge:  Continued Medical Work up, Programmer, applications ([place])   Candie Chroman, LCSW 11/30/2016, 4:32 PM

## 2016-11-30 NOTE — Progress Notes (Signed)
Per endo, patient will go for TEE tomorrow morning 9/5 at 0900.

## 2016-11-30 NOTE — Progress Notes (Signed)
[facility] for warfarin Indication: atrial fibrillation and stroke  Allergies  Allergen Reactions  . Penicillins Swelling    SWELLING REACTION UNSPECIFIED   PATIENT HAD A PCN REACTION WITH IMMEDIATE RASH, FACIAL/TONGUE/THROAT SWELLING, SOB, OR LIGHTHEADEDNESS WITH HYPOTENSION:  #  #  #  YES  #  #  #   Has patient had a PCN reaction causing severe rash involving mucus membranes or skin necrosis: No Has patient had a PCN reaction that required hospitalization No Has patient had a PCN reaction occurring within the last 10 years: No If all of the above answers are "NO", then may proceed with Cephalosporin use.  . Codeine Nausea Only  . Tramadol Nausea Only    Patient Measurements: Height: 5' 10.5" (179.1 cm) Weight: 195 lb 5.2 oz (88.6 kg) IBW/kg (Calculated) : 74.15  Vital Signs: Temp: 97.6 F (36.4 C) (09/04 0700) Temp Source: Oral (09/04 0700) BP: 126/61 (09/04 0238) Pulse Rate: 98 (09/04 0027)  Labs:  Recent Labs  11/28/16 0346 11/28/16 2327 11/29/16 0539 11/29/16 1648 11/30/16 0225  HGB 10.7*  --   --  10.0*  --   HCT 32.5*  --   --  30.5*  --   PLT 195  --   --  226  --   LABPROT 71.9* 20.9* 18.4*  --  17.5*  INR 8.84* 1.81 1.55  --  1.44  CREATININE 6.13*  --   --  7.23*  --     Estimated Creatinine Clearance: 9.1 mL/min (A) (by C-G formula based on SCr of 7.23 mg/dL (H)).    Assessment: 76 year old male with history of afib and cva on chronic coumadin prior to admission. INR has been elevated since admission and peaked at 7. Patient had nose bleed 9/2 and 5mg  of vitamin k was given total.   INR today = 1.44, no new bleeding noted  Goal of Therapy:  INR 2-3 Monitor platelets by anticoagulation protocol: Yes   Plan:  Warfarin 5mg  tonight Daily INR  Thank you Anette Guarneri, PharmD [phone] 11/30/2016 9:45 AM

## 2016-11-30 NOTE — Telephone Encounter (Signed)
Spoke to [name], daughter/DPR, who voiced no urgent concerns - states actually she was aware pt had a recent TEE but reason for procedure was explained to her by rounding PA. She had no further questions. She did want to make sure [name] knew pt was still in hospital. Informed her I would route msg.

## 2016-11-30 NOTE — Progress Notes (Signed)
PROGRESS NOTE    Alexander [name]  GGY:[id number] DOB: Nov 19, 1940 DOA: 11/23/2016 PCP: Deland Pretty, MD    Brief Narrative:  Alexander [name] is a 76 y.o. male with medical history significant of anemia, aortic stenosis, PUD, GERD, Barrett's esophagus, carpal tunnel syndrome, paroxysmal atrial fibrillation, history of bradycardia (S/Pdual lead pacemaker placement), CVA, depression, type 2 diabetes, diabetic peripheral neuropathy, diverticulosis, ESRD on hemodialysis, glaucoma, hyperlipidemia, hypertension, nephrolithiasis, macular degeneration/legally blind, seasonal allergies, tubular adenoma of the colon who is coming to the emergency department generalized body aches and malaise, muscle spasms of all extremities since monday after he finished hemodialysis. He was admitted for evaluation of meningitis, to step down. Meanwhile he developed a fib with RVR and cardizem gtt was started.   On 8/ 29 afternoon, pt became encephalopathic after he received 2.5 mg of valium , became unresponsive and hypoxic. Dr Florene Glen ordered flumazenil and he reverted back to baseline. Valium was discontinued.  Blood cultures from both aerobic and anaerobic bottles are positive for streptococcus species.  ID Dr Megan Salon consulted for appropriate antibiotics. He is currently on gentamicin , rocephin .  8/31, PT eval ordered.  9/1 right hip pain, CT of the right hip ordered negative for acute pathology.  9/5 plan for TEE IN AM.   Assessment & Plan:   Principal Problem:   Streptococcal bacteremia Active Problems:   Anemia, unspecified   Severe aortic stenosis   PAD (peripheral artery disease) (HCC)   ESRD (end stage renal disease) on dialysis (HCC)   Hyperlipidemia   Diabetic peripheral neuropathy (HCC)   S/P TAVR (transcatheter aortic valve replacement)   Atrial fibrillation with RVR (HCC)   Glaucoma   GERD (gastroesophageal reflux disease)   Hypomagnesemia   Type 2 diabetes mellitus (HCC)   Depression   SIRS  (systemic inflammatory response syndrome) (HCC)   Presence of permanent cardiac pacemaker  Generalized malaise, neck pain/  mild rhabdomyolysis.  Sepsis: from streptococcal bacteremia.  Suspected all this is due to STREPTOCOCCUS GALLOLYTICUS bacteremia. CT head without contrast showed old infarcts, repeat CT of the head was also negative for acute pathology, CT neck shows spondylosis.  CT chest without contrast does not show any pneumonia.  Started on IV meropenem and vancomycin, changed to rocephin and gentamicin.  ID consulted, recommended getting TTE and possible repeat CT cervical spine with contrast, discussed with DR Jonnie Finner,  Radiology and patient's daughter and ordered repeat CT cervical spine with contrast to evaluate for discitis and epidural abscess, which was negative for abscess.  Currently afebrile. Leukocytosis with left shift seen, but improving.  Continue with the same management as above.  Echocardiogram ordered and it did not show endocarditis. Cardiology for TEE, possibly on 9/5 Repeat blood cultures have been negative so far.  No change in medications.      Afib with RVR, pt has a h/o PAF,: Rate not well controlled, increased the dose of metoprolol to 50 mg BID and added cardizem. His rate is much better.  Was on coumadin and his INR IS still supra therapeutic. Patient started to have nose bleeds. Ordered 5 mg vitamin K. His INR is better and pharmacy to dose coumadin.   Elevated troponin's; From demand ischemia from afib with RVR.  Pt denies any chest pain.   ESRD on HD:  Nephrologist on board . Further management  As per renal.    Anemia of chronic disease:  Hemoglobin around 10. Monitor.   TYPE 2 DM:  CBG (last 3)   Recent  Labs  11/29/16 2109 11/30/16 0813 11/30/16 1230  GLUCAP 102* 80 138*    Resume SSI. No change in medications.    H/o severe AS, s/p TAVR:  Was on coumadin at home, INR supra therapeutic till today. Had nose bleeds all day ,  had to give him vitamin K, and his INR is sub therapeutic.  Pharmacy to dose Coumadin  GERD:  Resume PPI.    HYPOTENSION; on midodrine.  Asymptomatic. Resolved.   Leukocytosis: suspect its from the bacteremia. Wbc is normal.   Right hip pain: CT of the right hip ordered by ID and is negative for acute pathology.  - pain control with IV toradol.     DVT prophylaxis:heparin sq Code Status: (Full code) Family Communication: wife  at bedside.  Disposition Plan: SNF in 1 to 2 days after evaluation by TEE.    Consultants:   Nephrology  ID  PCCM over the phone.   Procedures: HD  Antimicrobials: MEROPENAM till 8/29.  Gentamicin from 8/30 vancomycin from admission till 8/31 Rocephin from 8/31. .   Subjective: In good spirits. No chest pain or sob.    Objective: Vitals:   11/30/16 0238 11/30/16 0700 11/30/16 0718 11/30/16 1155  BP: 126/61  119/83 102/77  Pulse:   98 76  Resp:   14 13  Temp: 97.6 F (36.4 C) 97.6 F (36.4 C)  97.6 F (36.4 C)  TempSrc: Oral Oral  Oral  SpO2:   96% 99%  Weight:      Height:        Intake/Output Summary (Last 24 hours) at 11/30/16 1410 Last data filed at 11/30/16 1030  Gross per 24 hour  Intake           353.75 ml  Output             1550 ml  Net         -1196.25 ml   Filed Weights   11/27/16 1246 11/29/16 1525 11/29/16 1907  Weight: 88.1 kg (194 lb 3.6 oz) 90 kg (198 lb 6.6 oz) 88.6 kg (195 lb 5.2 oz)    Examination:  General exam:  Alert and comfortable. Not in any distress. Not on oxygen.  Respiratory system:clear to auscultation. No wheezing or rhonchi.  Cardiovascular system:S1S2, irregular, no pedal edema.  Gastrointestinal system: Abdomen  Soft NT ND BS+ Central nervous system: Alert and oriented. No new deficits.  Extremities: Symmetric 5 x 5 power. No cyanosis or clubbing.  Skin: No rashes, lesions or ulcers Psychiatry: Mood & affect appropriate.     Data Reviewed: I have personally reviewed following labs  and imaging studies  CBC:  Recent Labs Lab 11/24/16 0312 11/24/16 2326 11/25/16 0513 11/26/16 0138 11/28/16 0346 11/29/16 1648  WBC 13.1* 13.6* 14.0* 12.9* 10.9* 10.3  NEUTROABS 11.5*  --  12.8* 11.6*  --   --   HGB 10.0* 9.7* 9.9* 10.1* 10.7* 10.0*  HCT 31.1* 29.9* 29.8* 31.3* 32.5* 30.5*  MCV 96.6 95.2 94.9 94.8 93.7 93.6  PLT 103* 124* 133* 166 195 644   Basic Metabolic Panel:  Recent Labs Lab 11/23/16 1936 11/24/16 0604 11/24/16 2326 11/25/16 0513 11/26/16 0138 11/28/16 0346 11/29/16 1648  NA  --  136 134* 137 136 134* 133*  K  --  4.2 4.0 3.5 3.7 4.2 4.1  CL  --  98* 97* 100* 96* 94* 93*  CO2  --  24 22 24 26 25 25   GLUCOSE  --  174* 170* 121* 140*  108* 119*  BUN  --  50* 68* 24* 45* 49* 63*  CREATININE  --  8.15* 8.95* 4.09* 5.77* 6.13* 7.23*  CALCIUM  --  7.9* 7.7* 8.0* 8.2* 8.2* 7.8*  MG 1.5*  --   --   --   --   --   --   PHOS  --  4.1 4.4 3.5 3.5  --  4.9*   GFR: Estimated Creatinine Clearance: 9.1 mL/min (A) (by C-G formula based on SCr of 7.23 mg/dL (H)). Liver Function Tests:  Recent Labs Lab 11/23/16 1936 11/24/16 0604 11/24/16 2326 11/25/16 0513 11/26/16 0138 11/29/16 1648  AST 55*  --   --   --   --   --   ALT 37  --   --   --   --   --   ALKPHOS 70  --   --   --   --   --   BILITOT 1.2  --   --   --   --   --   PROT 6.9  --   --   --   --   --   ALBUMIN 3.0* 2.6* 2.5* 2.5* 2.4* 2.4*   No results for input(s): LIPASE, AMYLASE in the last 168 hours. No results for input(s): AMMONIA in the last 168 hours. Coagulation Profile:  Recent Labs Lab 11/27/16 0227 11/28/16 0346 11/28/16 2327 11/29/16 0539 11/30/16 0225  INR 5.47* 8.84* 1.81 1.55 1.44   Cardiac Enzymes:  Recent Labs Lab 11/23/16 1936 11/24/16 0312 11/24/16 0604  CKTOTAL 630*  --   --   TROPONINI 0.09* 0.07* 0.07*   BNP (last 3 results)  Recent Labs  11/11/16 1143  PROBNP 18,028*   HbA1C: No results for input(s): HGBA1C in the last 72  hours. CBG:  Recent Labs Lab 11/29/16 0806 11/29/16 1149 11/29/16 2109 11/30/16 0813 11/30/16 1230  GLUCAP 83 109* 102* 80 138*   Lipid Profile: No results for input(s): CHOL, HDL, LDLCALC, TRIG, CHOLHDL, LDLDIRECT in the last 72 hours. Thyroid Function Tests: No results for input(s): TSH, T4TOTAL, FREET4, T3FREE, THYROIDAB in the last 72 hours. Anemia Panel: No results for input(s): VITAMINB12, FOLATE, FERRITIN, TIBC, IRON, RETICCTPCT in the last 72 hours. Sepsis Labs:  Recent Labs Lab 11/23/16 1951  LATICACIDVEN 1.23    Recent Results (from the past 240 hour(s))  Blood Culture (routine x 2)     Status: Abnormal   Collection Time: 11/23/16  8:15 PM  Result Value Ref Range Status   Specimen Description BLOOD RIGHT ANTECUBITAL  Final   Special Requests   Final    BOTTLES DRAWN AEROBIC AND ANAEROBIC Blood Culture adequate volume   Culture  Setup Time   Final    GRAM POSITIVE COCCI IN PAIRS AND CHAINS IN BOTH AEROBIC AND ANAEROBIC BOTTLES CRITICAL VALUE NOTED.  VALUE IS CONSISTENT WITH PREVIOUSLY REPORTED AND CALLED VALUE.    Culture (A)  Final    STREPTOCOCCUS GALLOLYTICUS SUSCEPTIBILITIES PERFORMED ON PREVIOUS CULTURE WITHIN THE LAST 5 DAYS.    Report Status 11/26/2016 FINAL  Final  Blood Culture (routine x 2)     Status: Abnormal   Collection Time: 11/23/16  8:22 PM  Result Value Ref Range Status   Specimen Description BLOOD RIGHT WRIST  Final   Special Requests   Final    BOTTLES DRAWN AEROBIC AND ANAEROBIC Blood Culture adequate volume   Culture  Setup Time   Final    GRAM POSITIVE COCCI IN PAIRS AND CHAINS  IN BOTH AEROBIC AND ANAEROBIC BOTTLES CRITICAL RESULT CALLED TO, READ BACK BY AND VERIFIED WITH: M. Radford Pax Pharm.D. 10:35 11/24/16 ([email or web address])    Culture STREPTOCOCCUS GALLOLYTICUS (A)  Final   Report Status 11/26/2016 FINAL  Final   Organism ID, Bacteria STREPTOCOCCUS GALLOLYTICUS  Final      Susceptibility   Streptococcus gallolyticus - MIC*     PENICILLIN 0.12 SENSITIVE Sensitive     CEFTRIAXONE <=0.12 SENSITIVE Sensitive     ERYTHROMYCIN INTERMEDIATE Intermediate     LEVOFLOXACIN 2 SENSITIVE Sensitive     VANCOMYCIN 0.5 SENSITIVE Sensitive     * STREPTOCOCCUS GALLOLYTICUS  Blood Culture ID Panel (Reflexed)     Status: Abnormal   Collection Time: 11/23/16  8:22 PM  Result Value Ref Range Status   Enterococcus species NOT DETECTED NOT DETECTED Final   Listeria monocytogenes NOT DETECTED NOT DETECTED Final   Staphylococcus species NOT DETECTED NOT DETECTED Final   Staphylococcus aureus NOT DETECTED NOT DETECTED Final   Streptococcus species DETECTED (A) NOT DETECTED Final    Comment: Not Enterococcus species, Streptococcus agalactiae, Streptococcus pyogenes, or Streptococcus pneumoniae. CRITICAL RESULT CALLED TO, READ BACK BY AND VERIFIED WITH: M. Radford Pax Pharm.D. 10:35 11/24/16 ([email or web address])    Streptococcus agalactiae NOT DETECTED NOT DETECTED Final   Streptococcus pneumoniae NOT DETECTED NOT DETECTED Final   Streptococcus pyogenes NOT DETECTED NOT DETECTED Final   Acinetobacter baumannii NOT DETECTED NOT DETECTED Final   Enterobacteriaceae species NOT DETECTED NOT DETECTED Final   Enterobacter cloacae complex NOT DETECTED NOT DETECTED Final   Escherichia coli NOT DETECTED NOT DETECTED Final   Klebsiella oxytoca NOT DETECTED NOT DETECTED Final   Klebsiella pneumoniae NOT DETECTED NOT DETECTED Final   Proteus species NOT DETECTED NOT DETECTED Final   Serratia marcescens NOT DETECTED NOT DETECTED Final   Haemophilus influenzae NOT DETECTED NOT DETECTED Final   Neisseria meningitidis NOT DETECTED NOT DETECTED Final   Pseudomonas aeruginosa NOT DETECTED NOT DETECTED Final   Candida albicans NOT DETECTED NOT DETECTED Final   Candida glabrata NOT DETECTED NOT DETECTED Final   Candida krusei NOT DETECTED NOT DETECTED Final   Candida parapsilosis NOT DETECTED NOT DETECTED Final   Candida tropicalis NOT DETECTED NOT DETECTED Final   MRSA PCR Screening     Status: None   Collection Time: 11/24/16  3:25 AM  Result Value Ref Range Status   MRSA by PCR NEGATIVE NEGATIVE Final    Comment:        The GeneXpert MRSA Assay (FDA approved for NASAL specimens only), is one component of a comprehensive MRSA colonization surveillance program. It is not intended to diagnose MRSA infection nor to guide or monitor treatment for MRSA infections.   Culture, blood (routine x 2)     Status: None (Preliminary result)   Collection Time: 11/25/16 10:18 AM  Result Value Ref Range Status   Specimen Description BLOOD RIGHT WRIST  Final   Special Requests   Final    BOTTLES DRAWN AEROBIC AND ANAEROBIC Blood Culture adequate volume   Culture NO GROWTH 4 DAYS  Final   Report Status PENDING  Incomplete  Culture, blood (routine x 2)     Status: None (Preliminary result)   Collection Time: 11/25/16 10:18 AM  Result Value Ref Range Status   Specimen Description BLOOD RIGHT HAND  Final   Special Requests   Final    BOTTLES DRAWN AEROBIC AND ANAEROBIC Blood Culture adequate volume   Culture NO GROWTH 4 DAYS  Final   Report  Status PENDING  Incomplete         Radiology Studies: No results found.      Scheduled Meds: . atorvastatin  20 mg Oral Daily  . bromocriptine  5 mg Oral QHS  . calcium acetate  667 mg Oral TID WC  . cinacalcet  30 mg Oral QPM  . diltiazem  30 mg Oral Q12H  . dorzolamide-timolol  1 drop Both Eyes BID  . loratadine  10 mg Oral Daily  . mouth rinse  15 mL Mouth Rinse BID  . metoprolol tartrate  50 mg Oral BID  . midodrine  15 mg Oral Q M,W,F-HD  . pantoprazole  40 mg Oral Daily  . polyethylene glycol  17 g Oral Daily  . predniSONE  5 mg Oral Daily  . sertraline  50 mg Oral Daily  . warfarin  5 mg Oral ONCE-1800  . Warfarin - Pharmacist Dosing Inpatient   Does not apply q1800   Continuous Infusions: . cefTRIAXone (ROCEPHIN)  IV Stopped (11/29/16 2038)  . gentamicin Stopped (11/29/16 2039)  .  methocarbamol (ROBAXIN)  IV Stopped (11/27/16 1209)     LOS: 7 days    Time spent: 35 minutes.    Hosie Poisson, MD [facility] Pager [phone]   If 7PM-7AM, please contact night-coverage [email or web address] Password [email or web address] 11/30/2016, 2:10 PM

## 2016-11-30 NOTE — NC FL2 (Signed)
[place] MEDICAID FL2 LEVEL OF CARE SCREENING TOOL     IDENTIFICATION  Patient Name: Alexander [name] Birthdate: 06/19/1940 Sex: male Admission Date (Current Location): 11/23/2016  [facility] and [place] Number:  Herbalist and Address:  [facility] [place], [place] [address] [place], [place] [address]      Provider Number: [id number]  Attending Physician Name and Address:  Hosie Poisson, MD  Relative Name and Phone Number:       Current Level of Care: Hospital Recommended Level of Care: [place] Prior Approval Number:    Date Approved/Denied:   PASRR Number: Manual review  Discharge Plan: SNF    Current Diagnoses: Patient Active Problem List   Diagnosis Date Noted  . Presence of permanent cardiac pacemaker 11/24/2016  . Streptococcal bacteremia 11/24/2016  . Atrial fibrillation with RVR ([place]) 11/23/2016  . Glaucoma 11/23/2016  . GERD (gastroesophageal reflux disease) 11/23/2016  . Hypomagnesemia 11/23/2016  . Type 2 diabetes mellitus ([place]) 11/23/2016  . Depression 11/23/2016  . SIRS (systemic inflammatory response syndrome) ([place]) 11/23/2016  . S/P TAVR (transcatheter aortic valve replacement) 08/31/2016  . Long term (current) use of anticoagulants [Z79.01] 08/13/2016  . Persistent atrial fibrillation ([place])   . Carpal tunnel syndrome, bilateral 11/03/2015  . Diabetic peripheral neuropathy ([place]) 10/02/2015  . Long-term (current) use of anticoagulants 04/08/2014  . Hyperlipidemia 04/04/2014  . ESRD (end stage renal disease) on dialysis ([place]) 03/13/2013  . PAD (peripheral artery disease) ([place]) 12/12/2012  . Severe aortic stenosis 06/15/2012  . Personal history of colonic polyps 02/03/2011  . Esophageal reflux 02/03/2011  . Anemia, unspecified 02/03/2011    Orientation RESPIRATION BLADDER Height & Weight     Self  Normal Continent Weight: 195 lb 5.2 oz (88.6 kg) Height:  5' 10.5" (179.1 cm)  BEHAVIORAL SYMPTOMS/MOOD  NEUROLOGICAL BOWEL NUTRITION STATUS   (None)  (None) Continent Diet (Renal/carb modified with fluid restriction)  AMBULATORY STATUS COMMUNICATION OF NEEDS Skin   Limited Assist Verbally Bruising                       Personal Care Assistance Level of Assistance              Functional Limitations Info  Sight, Hearing, Speech Sight Info: Adequate Hearing Info: Adequate Speech Info: Adequate    SPECIAL CARE FACTORS FREQUENCY  PT (By licensed PT)     PT Frequency: 5 x week              Contractures Contractures Info: Not present    Additional Factors Info  Code Status, Allergies, Psychotropic Code Status Info: Full Allergies Info: Penicillins, Codeine, Tramadol Psychotropic Info: Depression: Zoloft 50 mg PO daily         Current Medications (11/30/2016):  This is the current hospital active medication list Current Facility-Administered Medications  Medication Dose Route Frequency Provider Last Rate Last Dose  . acetaminophen (TYLENOL) tablet 650 mg  650 mg Oral Q6H PRN Reubin Milan, MD   650 mg at 11/26/16 1651  . atorvastatin (LIPITOR) tablet 20 mg  20 mg Oral Daily Reubin Milan, MD   20 mg at 11/30/16 5784  . bisacodyl (DULCOLAX) EC tablet 10 mg  10 mg Oral Daily PRN Hosie Poisson, MD      . bisacodyl (DULCOLAX) suppository 10 mg  10 mg Rectal Daily PRN Hosie Poisson, MD   10 mg at 11/25/16 1435  . bromocriptine (PARLODEL) tablet 5 mg  5 mg Oral QHS Reubin Milan, MD   5 mg at 11/29/16 2124  . calcium acetate (PHOSLO) capsule 667 mg  667 mg Oral TID WC Reubin Milan, MD   667 mg at 11/30/16 1242  . cefTRIAXone (ROCEPHIN) 2 g in dextrose 5 % 50 mL IVPB  2 g Intravenous q1800 Michel Bickers, MD   Stopped at 11/29/16 2038  . cinacalcet (SENSIPAR) tablet 30 mg  30 mg Oral QPM Reubin Milan, MD   30 mg at 11/29/16 2009  . diltiazem (CARDIZEM) tablet 30 mg  30 mg Oral Q12H Hosie Poisson, MD   30 mg at 11/30/16 0905  .  dorzolamide-timolol (COSOPT) 22.3-6.8 MG/ML ophthalmic solution 1 drop  1 drop Both Eyes BID Hosie Poisson, MD   1 drop at 11/30/16 0906  . gentamicin (GARAMYCIN) 70 mg in dextrose 5 % 50 mL IVPB  70 mg Intravenous Q M,W,F-HD Michel Bickers, MD   Stopped at 11/29/16 2039  . loratadine (CLARITIN) tablet 10 mg  10 mg Oral Daily Reubin Milan, MD   10 mg at 11/30/16 8786  . MEDLINE mouth rinse  15 mL Mouth Rinse BID Hosie Poisson, MD   15 mL at 11/30/16 1137  . methocarbamol (ROBAXIN) 500 mg in dextrose 5 % 50 mL IVPB  500 mg Intravenous Q6H PRN Hosie Poisson, MD   Stopped at 11/27/16 1209  . metoprolol tartrate (LOPRESSOR) tablet 50 mg  50 mg Oral BID Hosie Poisson, MD   50 mg at 11/30/16 0905  . midodrine (PROAMATINE) tablet 15 mg  15 mg Oral Q M,W,F-HD Reubin Milan, MD   15 mg at 11/29/16 1548  . morphine 2 MG/ML injection 1-2 mg  1-2 mg Intravenous Q4H PRN Hosie Poisson, MD   2 mg at 11/26/16 2342  . MUSCLE RUB CREA 1 application  1 application Topical PRN Hosie Poisson, MD   1 application at 76/72/09 2345  . ondansetron (ZOFRAN) tablet 4 mg  4 mg Oral Q6H PRN Reubin Milan, MD       Or  . ondansetron [facility]) injection 4 mg  4 mg Intravenous Q6H PRN Reubin Milan, MD      . pantoprazole (PROTONIX) EC tablet 40 mg  40 mg Oral Daily Reubin Milan, MD   40 mg at 11/30/16 4709  . polyethylene glycol (MIRALAX / GLYCOLAX) packet 17 g  17 g Oral Daily Hosie Poisson, MD   17 g at 11/30/16 0906  . predniSONE (DELTASONE) tablet 5 mg  5 mg Oral Daily Reubin Milan, MD   5 mg at 11/30/16 6283  . senna-docusate (Senokot-S) tablet 1 tablet  1 tablet Oral BID PRN [place] Callas, NP      . sertraline (ZOLOFT) tablet 50 mg  50 mg Oral Daily Reubin Milan, MD   50 mg at 11/30/16 0905  . warfarin (COUMADIN) tablet 5 mg  5 mg Oral ONCE-1800 Hosie Poisson, MD      . Warfarin - Pharmacist Dosing Inpatient   Does not apply q1800 Lyndee Leo, [facility]         Discharge  Medications: Please see discharge summary for a list of discharge medications.  Relevant Imaging Results:  Relevant Lab Results:   Additional Information SS#: [id number]. HD MWF at [facility]. Already connected with [facility] if needed.  Candie Chroman, LCSW

## 2016-11-30 NOTE — Progress Notes (Signed)
    [facility] has been requested to perform a transesophageal echocardiogram on  Alexander [name]  for Bacteremia.  After careful review of history and examination, the risks and benefits of transesophageal echocardiogram have been explained including risks of esophageal damage, perforation (1:10,000 risk), bleeding, pharyngeal hematoma as well as other potential complications associated with conscious sedation including aspiration, arrhythmia, respiratory failure and death. Alternatives to treatment were discussed, questions were answered. Patient is willing to proceed.  TEE - Dr. Aundra Dubin @ 9am . NPO after midnight. Meds with sips.   Kataleena Holsapple, PA-C 11/30/2016 3:24 PM

## 2016-11-30 NOTE — Clinical Social Work Placement (Signed)
   CLINICAL SOCIAL WORK PLACEMENT  NOTE  Date:  11/30/2016  Patient Details  Name: Alexander [name] MRN: [id number] Date of Birth: [birth year]  Clinical Social Work is seeking post-discharge placement for this patient at the [place] level of care (*CSW will initial, date and re-position this form in  chart as items are completed):  Yes   Patient/family provided with [facility] Work Department's list of facilities offering this level of care within the geographic area requested by the patient (or if unable, by the patient's family).  Yes   Patient/family informed of their freedom to choose among providers that offer the needed level of care, that participate in Medicare, Medicaid or managed care program needed by the patient, have an available bed and are willing to accept the patient.  Yes   Patient/family informed of [facility]'s ownership interest in [facility] and [facility], as well as of the fact that they are under no obligation to receive care at these facilities.  PASRR submitted to EDS on 11/30/16     PASRR number received on       Existing PASRR number confirmed on       FL2 transmitted to all facilities in geographic area requested by pt/family on 11/30/16     FL2 transmitted to all facilities within larger geographic area on       Patient informed that his/her managed care company has contracts with or will negotiate with certain facilities, including the following:            Patient/family informed of bed offers received.  Patient chooses bed at       Physician recommends and patient chooses bed at      Patient to be transferred to   on  .  Patient to be transferred to facility by       Patient family notified on   of transfer.  Name of family member notified:        PHYSICIAN Please sign FL2     Additional Comment:    _______________________________________________ Candie Chroman, LCSW 11/30/2016, 4:35 PM

## 2016-11-30 NOTE — Patient Outreach (Signed)
[place] [facility]) [place] Visit 11/30/2016  Alexander [name] 1940/11/16 [id number]  Courtesy hospital visit to Dhiren Azimi Vanwart,76 y.o.malereferred to [place] from [facility] telephonic RN CM on original MD referral. Patient has history including, but not limited to, PAD, Aortic stenosis with TAVR 08/31/16, ESRD on HD (M, W, F), A-Fib on coumadin, DM, CVA in 2004, CHF, macular degeneration with legal blindness, and multiple falls. Patient presented to hospital 11/23/16 for chest pain and general malaise, and was admitted for further workup.  [employer] student L. Kirk Ruths is also present for today's courtesy hospital visit.  Last week, had received voicemail message from patient's caregiver/ daughter stating that scheduled patient visit today would need to be rescheduled if patient remained in hospital; patient remains admitted today, and previously scheduled home visit for today was cancelled.  Today, patient is present in his hospital room along with his spouse, on [facility] CM written consent.  Patient provides verbal consent to speak with he and his spouse today.  Provided update to patient and his spouse that [facility] CM would remain involved in patient's ongoing care post-hospital discharge; all questions regarding [facility] CM involvement in patient's care were answered and addressed; also provided update to patient and his spouse my general schedule in the coming weeks, and confirmed that they have [facility] CM phone numbers for main office and 24-hour nurse advice line.  Patient and his spouse verbalize understanding and agreement with plan for ongoing [facility] CM involvement in patient's care post-hospital discharge.  Plan:  Will continue collaborating with [facility] liaisons for patient discharge disposition.    [name], RN, BSN, [employer] [facility] Care Management  [phone]

## 2016-11-30 NOTE — Telephone Encounter (Signed)
New message      Pt is in the hosp.  He is scheduled for a TEE tomorrow.  Daughter want to talk to the nurse regarding this test .  She has questions/concerns

## 2016-11-30 NOTE — Progress Notes (Signed)
INFECTIOUS DISEASE PROGRESS NOTE  ID: Alexander [name] is a 76 y.o. male with  Principal Problem:   Streptococcal bacteremia Active Problems:   Anemia, unspecified   Severe aortic stenosis   PAD (peripheral artery disease) (HCC)   ESRD (end stage renal disease) on dialysis (HCC)   Hyperlipidemia   Diabetic peripheral neuropathy (HCC)   S/P TAVR (transcatheter aortic valve replacement)   Atrial fibrillation with RVR (HCC)   Glaucoma   GERD (gastroesophageal reflux disease)   Hypomagnesemia   Type 2 diabetes mellitus (HCC)   Depression   SIRS (systemic inflammatory response syndrome) (HCC)   Presence of permanent cardiac pacemaker  Subjective: No complaints, HD yesterday Pain in neck with rotation. No point tenderness.   Abtx:  Anti-infectives    Start     Dose/Rate Route Frequency Ordered Stop   11/26/16 1700  cefTRIAXone (ROCEPHIN) 2 g in dextrose 5 % 50 mL IVPB     2 g 100 mL/hr over 30 Minutes Intravenous Daily-1800 11/26/16 1639     11/26/16 1200  gentamicin (GARAMYCIN) 70 mg in dextrose 5 % 50 mL IVPB     70 mg 103.5 mL/hr over 30 Minutes Intravenous Every M-W-F (Hemodialysis) 11/24/16 1835     11/25/16 1800  meropenem (MERREM) 1 g in sodium chloride 0.9 % 100 mL IVPB  Status:  Discontinued     1 g 200 mL/hr over 30 Minutes Intravenous Every 24 hours 11/24/16 0832 11/24/16 1741   11/24/16 2200  gentamicin (GARAMYCIN) 150 mg in dextrose 5 % 50 mL IVPB     150 mg 107.5 mL/hr over 30 Minutes Intravenous  Once 11/24/16 1826 11/25/16 0335   11/24/16 2200  vancomycin (VANCOCIN) IVPB 1000 mg/200 mL premix  Status:  Discontinued     1,000 mg 200 mL/hr over 60 Minutes Intravenous Every M-W-F (Hemodialysis) 11/24/16 1828 11/26/16 1639   11/24/16 1000  meropenem (MERREM) 500 mg in sodium chloride 0.9 % 50 mL IVPB     500 mg 100 mL/hr over 30 Minutes Intravenous  Once 11/24/16 0832 11/24/16 1001   11/24/16 0130  meropenem (MERREM) 500 mg in sodium chloride 0.9 % 50 mL IVPB   Status:  Discontinued    Comments:  May discontinue cefepime once meropenem started.   500 mg 100 mL/hr over 30 Minutes Intravenous Every 24 hours 11/24/16 0115 11/24/16 0832   11/23/16 2030  ceFEPIme (MAXIPIME) 2 g in dextrose 5 % 50 mL IVPB     2 g 100 mL/hr over 30 Minutes Intravenous  Once 11/23/16 2024 11/24/16 0331   11/23/16 2030  vancomycin (VANCOCIN) 2,000 mg in sodium chloride 0.9 % 500 mL IVPB     2,000 mg 250 mL/hr over 120 Minutes Intravenous  Once 11/23/16 2024 11/24/16 0331   11/23/16 2000  levofloxacin (LEVAQUIN) IVPB 750 mg  Status:  Discontinued     750 mg 100 mL/hr over 90 Minutes Intravenous  Once 11/23/16 1959 11/23/16 2024   11/23/16 2000  aztreonam (AZACTAM) 2 g in dextrose 5 % 50 mL IVPB  Status:  Discontinued     2 g 100 mL/hr over 30 Minutes Intravenous  Once 11/23/16 1959 11/23/16 2024   11/23/16 2000  vancomycin (VANCOCIN) IVPB 1000 mg/200 mL premix  Status:  Discontinued     1,000 mg 200 mL/hr over 60 Minutes Intravenous  Once 11/23/16 1959 11/23/16 2024      Medications:  Scheduled: . atorvastatin  20 mg Oral Daily  . bromocriptine  5 mg  Oral QHS  . calcium acetate  667 mg Oral TID WC  . cinacalcet  30 mg Oral QPM  . diltiazem  30 mg Oral Q12H  . dorzolamide-timolol  1 drop Both Eyes BID  . loratadine  10 mg Oral Daily  . mouth rinse  15 mL Mouth Rinse BID  . metoprolol tartrate  50 mg Oral BID  . midodrine  15 mg Oral Q M,W,F-HD  . pantoprazole  40 mg Oral Daily  . polyethylene glycol  17 g Oral Daily  . predniSONE  5 mg Oral Daily  . sertraline  50 mg Oral Daily  . warfarin  5 mg Oral ONCE-1800  . Warfarin - Pharmacist Dosing Inpatient   Does not apply q1800    Objective: Vital signs in last 24 hours: Temp:  [97.4 F (36.3 C)-97.8 F (36.6 C)] 97.6 F (36.4 C) (09/04 0700) Pulse Rate:  [81-118] 98 (09/04 0027) Resp:  [14-30] 20 (09/04 0027) BP: (85-126)/(60-89) 126/61 (09/04 0238) SpO2:  [92 %-100 %] 98 % (09/04 0027) Weight:  [88.6  kg (195 lb 5.2 oz)-90 kg (198 lb 6.6 oz)] 88.6 kg (195 lb 5.2 oz) (09/03 1907)   General appearance: alert, cooperative and no distress Neck: no tenderness to palpation.  Resp: clear to auscultation bilaterally Cardio: regular rate and rhythm GI: normal findings: bowel sounds normal and soft, non-tender Extremities: LUE AVF +bruit  Lab Results  Recent Labs  11/28/16 0346 11/29/16 1648  WBC 10.9* 10.3  HGB 10.7* 10.0*  HCT 32.5* 30.5*  NA 134* 133*  K 4.2 4.1  CL 94* 93*  CO2 25 25  BUN 49* 63*  CREATININE 6.13* 7.23*   Liver Panel  Recent Labs  11/29/16 1648  ALBUMIN 2.4*   Sedimentation Rate No results for input(s): ESRSEDRATE in the last 72 hours. C-Reactive Protein No results for input(s): CRP in the last 72 hours.  Microbiology: Recent Results (from the past 240 hour(s))  Blood Culture (routine x 2)     Status: Abnormal   Collection Time: 11/23/16  8:15 PM  Result Value Ref Range Status   Specimen Description BLOOD RIGHT ANTECUBITAL  Final   Special Requests   Final    BOTTLES DRAWN AEROBIC AND ANAEROBIC Blood Culture adequate volume   Culture  Setup Time   Final    GRAM POSITIVE COCCI IN PAIRS AND CHAINS IN BOTH AEROBIC AND ANAEROBIC BOTTLES CRITICAL VALUE NOTED.  VALUE IS CONSISTENT WITH PREVIOUSLY REPORTED AND CALLED VALUE.    Culture (A)  Final    STREPTOCOCCUS GALLOLYTICUS SUSCEPTIBILITIES PERFORMED ON PREVIOUS CULTURE WITHIN THE LAST 5 DAYS.    Report Status 11/26/2016 FINAL  Final  Blood Culture (routine x 2)     Status: Abnormal   Collection Time: 11/23/16  8:22 PM  Result Value Ref Range Status   Specimen Description BLOOD RIGHT WRIST  Final   Special Requests   Final    BOTTLES DRAWN AEROBIC AND ANAEROBIC Blood Culture adequate volume   Culture  Setup Time   Final    GRAM POSITIVE COCCI IN PAIRS AND CHAINS IN BOTH AEROBIC AND ANAEROBIC BOTTLES CRITICAL RESULT CALLED TO, READ BACK BY AND VERIFIED WITH: Truett Perna.D. 10:35 11/24/16  ([email or web address])    Culture STREPTOCOCCUS GALLOLYTICUS (A)  Final   Report Status 11/26/2016 FINAL  Final   Organism ID, Bacteria STREPTOCOCCUS GALLOLYTICUS  Final      Susceptibility   Streptococcus gallolyticus - MIC*    PENICILLIN 0.12 SENSITIVE Sensitive  CEFTRIAXONE <=0.12 SENSITIVE Sensitive     ERYTHROMYCIN INTERMEDIATE Intermediate     LEVOFLOXACIN 2 SENSITIVE Sensitive     VANCOMYCIN 0.5 SENSITIVE Sensitive     * STREPTOCOCCUS GALLOLYTICUS  Blood Culture ID Panel (Reflexed)     Status: Abnormal   Collection Time: 11/23/16  8:22 PM  Result Value Ref Range Status   Enterococcus species NOT DETECTED NOT DETECTED Final   Listeria monocytogenes NOT DETECTED NOT DETECTED Final   Staphylococcus species NOT DETECTED NOT DETECTED Final   Staphylococcus aureus NOT DETECTED NOT DETECTED Final   Streptococcus species DETECTED (A) NOT DETECTED Final    Comment: Not Enterococcus species, Streptococcus agalactiae, Streptococcus pyogenes, or Streptococcus pneumoniae. CRITICAL RESULT CALLED TO, READ BACK BY AND VERIFIED WITH: M. Radford Pax Pharm.D. 10:35 11/24/16 ([email or web address])    Streptococcus agalactiae NOT DETECTED NOT DETECTED Final   Streptococcus pneumoniae NOT DETECTED NOT DETECTED Final   Streptococcus pyogenes NOT DETECTED NOT DETECTED Final   Acinetobacter baumannii NOT DETECTED NOT DETECTED Final   Enterobacteriaceae species NOT DETECTED NOT DETECTED Final   Enterobacter cloacae complex NOT DETECTED NOT DETECTED Final   Escherichia coli NOT DETECTED NOT DETECTED Final   Klebsiella oxytoca NOT DETECTED NOT DETECTED Final   Klebsiella pneumoniae NOT DETECTED NOT DETECTED Final   Proteus species NOT DETECTED NOT DETECTED Final   Serratia marcescens NOT DETECTED NOT DETECTED Final   Haemophilus influenzae NOT DETECTED NOT DETECTED Final   Neisseria meningitidis NOT DETECTED NOT DETECTED Final   Pseudomonas aeruginosa NOT DETECTED NOT DETECTED Final   Candida albicans NOT DETECTED NOT DETECTED  Final   Candida glabrata NOT DETECTED NOT DETECTED Final   Candida krusei NOT DETECTED NOT DETECTED Final   Candida parapsilosis NOT DETECTED NOT DETECTED Final   Candida tropicalis NOT DETECTED NOT DETECTED Final  MRSA PCR Screening     Status: None   Collection Time: 11/24/16  3:25 AM  Result Value Ref Range Status   MRSA by PCR NEGATIVE NEGATIVE Final    Comment:        The GeneXpert MRSA Assay (FDA approved for NASAL specimens only), is one component of a comprehensive MRSA colonization surveillance program. It is not intended to diagnose MRSA infection nor to guide or monitor treatment for MRSA infections.   Culture, blood (routine x 2)     Status: None (Preliminary result)   Collection Time: 11/25/16 10:18 AM  Result Value Ref Range Status   Specimen Description BLOOD RIGHT WRIST  Final   Special Requests   Final    BOTTLES DRAWN AEROBIC AND ANAEROBIC Blood Culture adequate volume   Culture NO GROWTH 4 DAYS  Final   Report Status PENDING  Incomplete  Culture, blood (routine x 2)     Status: None (Preliminary result)   Collection Time: 11/25/16 10:18 AM  Result Value Ref Range Status   Specimen Description BLOOD RIGHT HAND  Final   Special Requests   Final    BOTTLES DRAWN AEROBIC AND ANAEROBIC Blood Culture adequate volume   Culture NO GROWTH 4 DAYS  Final   Report Status PENDING  Incomplete    Studies/Results: No results found.   Assessment/Plan: Strep gallolyticus bacteremia (8-28)  Repeat BCx 8-30 ngtd  TAVR 08-2016  Pacer ESRD  TEE schduled for AM Continue current anbx while we await TEE result.  Will aim for HD amenable anbx regimen at d/c to avoid PIC.  HD MWF  Explained to pt, family  Total days of antibiotics: 8 (ceftriaxone/gent)  Bobby Rumpf Infectious Diseases (pager) [phone] [email or web address] 11/30/2016, 9:45 AM  LOS: 7 days

## 2016-12-01 ENCOUNTER — Encounter (HOSPITAL_COMMUNITY): Payer: Self-pay | Admitting: *Deleted

## 2016-12-01 ENCOUNTER — Encounter (HOSPITAL_COMMUNITY)
Admission: EM | Disposition: A | Discharge status: Skilled Nursing Facility | Payer: Self-pay | Source: Home / Self Care | Attending: Internal Medicine

## 2016-12-01 ENCOUNTER — Inpatient Hospital Stay (HOSPITAL_COMMUNITY): Payer: Medicare Other

## 2016-12-01 DIAGNOSIS — I739 Peripheral vascular disease, unspecified: Secondary | ICD-10-CM

## 2016-12-01 DIAGNOSIS — A409 Streptococcal sepsis, unspecified: Secondary | ICD-10-CM

## 2016-12-01 DIAGNOSIS — E1142 Type 2 diabetes mellitus with diabetic polyneuropathy: Secondary | ICD-10-CM

## 2016-12-01 HISTORY — PX: TEE WITHOUT CARDIOVERSION: SHX5443

## 2016-12-01 LAB — RENAL FUNCTION PANEL
ANION GAP: 17 — AB (ref 5–15)
Albumin: 2.4 g/dL — ABNORMAL LOW (ref 3.5–5.0)
BUN: 53 mg/dL — ABNORMAL HIGH (ref 6–20)
CALCIUM: 7.9 mg/dL — AB (ref 8.9–10.3)
CO2: 23 mmol/L (ref 22–32)
Chloride: 97 mmol/L — ABNORMAL LOW (ref 101–111)
Creatinine, Ser: 6.86 mg/dL — ABNORMAL HIGH (ref 0.61–1.24)
GFR, EST AFRICAN AMERICAN: 8 mL/min — AB (ref >60)
GFR, EST NON AFRICAN AMERICAN: 7 mL/min — AB (ref >60)
Glucose, Bld: 102 mg/dL — ABNORMAL HIGH (ref 65–99)
Phosphorus: 5.4 mg/dL — ABNORMAL HIGH (ref 2.5–4.6)
Potassium: 5.5 mmol/L — ABNORMAL HIGH (ref 3.5–5.1)
SODIUM: 137 mmol/L (ref 135–145)

## 2016-12-01 LAB — PROTIME-INR
INR: 1.61
Prothrombin Time: 19 seconds — ABNORMAL HIGH (ref 11.4–15.2)

## 2016-12-01 LAB — GLUCOSE, CAPILLARY
GLUCOSE-CAPILLARY: 113 mg/dL — AB (ref 65–99)
GLUCOSE-CAPILLARY: 81 mg/dL (ref 65–99)
Glucose-Capillary: 124 mg/dL — ABNORMAL HIGH (ref 65–99)

## 2016-12-01 SURGERY — ECHOCARDIOGRAM, TRANSESOPHAGEAL
Anesthesia: Moderate Sedation

## 2016-12-01 MED ORDER — PENTAFLUOROPROP-TETRAFLUOROETH EX AERO
1.0000 "application " | INHALATION_SPRAY | CUTANEOUS | Status: DC | PRN
  Filled 2016-12-01: qty 30

## 2016-12-01 MED ORDER — SODIUM CHLORIDE 0.9 % IV SOLN
INTRAVENOUS | Status: DC
  Administered 2016-12-01: 09:00:00 via INTRAVENOUS

## 2016-12-01 MED ORDER — CEFAZOLIN SODIUM-DEXTROSE 2-4 GM/100ML-% IV SOLN
2.0000 g | INTRAVENOUS | Status: DC
  Administered 2016-12-01: 2 g via INTRAVENOUS
  Filled 2016-12-01 (×3): qty 100

## 2016-12-01 MED ORDER — BUTAMBEN-TETRACAINE-BENZOCAINE 2-2-14 % EX AERO
INHALATION_SPRAY | CUTANEOUS | Status: DC | PRN
  Administered 2016-12-01: 2 via TOPICAL

## 2016-12-01 MED ORDER — DIPHENHYDRAMINE HCL 50 MG/ML IJ SOLN
INTRAMUSCULAR | Status: AC
  Filled 2016-12-01: qty 1

## 2016-12-01 MED ORDER — SODIUM CHLORIDE 0.9 % IV SOLN: 100.0000 mL | INTRAVENOUS | Status: DC | PRN

## 2016-12-01 MED ORDER — HEPARIN SODIUM (PORCINE) 1000 UNIT/ML DIALYSIS: 2800.0000 [IU] | Freq: Once | INTRAMUSCULAR | Status: DC

## 2016-12-01 MED ORDER — FENTANYL CITRATE (PF) 100 MCG/2ML IJ SOLN
INTRAMUSCULAR | Status: DC | PRN
  Administered 2016-12-01: 25 ug via INTRAVENOUS

## 2016-12-01 MED ORDER — MIDAZOLAM HCL 5 MG/ML IJ SOLN
INTRAMUSCULAR | Status: AC
  Filled 2016-12-01: qty 2

## 2016-12-01 MED ORDER — MIDAZOLAM HCL 10 MG/2ML IJ SOLN
INTRAMUSCULAR | Status: DC | PRN
  Administered 2016-12-01 (×2): 1 mg via INTRAVENOUS

## 2016-12-01 MED ORDER — LIDOCAINE HCL (PF) 1 % IJ SOLN: 5.0000 mL | INTRAMUSCULAR | Status: DC | PRN

## 2016-12-01 MED ORDER — WARFARIN SODIUM 5 MG PO TABS
5.0000 mg | ORAL_TABLET | Freq: Once | ORAL | Status: AC
  Administered 2016-12-01: 5 mg via ORAL
  Filled 2016-12-01: qty 1

## 2016-12-01 MED ORDER — ALTEPLASE 2 MG IJ SOLR: 2.0000 mg | Freq: Once | INTRAMUSCULAR | Status: DC | PRN

## 2016-12-01 MED ORDER — FENTANYL CITRATE (PF) 100 MCG/2ML IJ SOLN
INTRAMUSCULAR | Status: AC
  Filled 2016-12-01: qty 2

## 2016-12-01 MED ORDER — MIDODRINE HCL 5 MG PO TABS
ORAL_TABLET | ORAL | Status: AC
  Administered 2016-12-01: 15 mg via ORAL
  Filled 2016-12-01: qty 15

## 2016-12-01 MED ORDER — HEPARIN SODIUM (PORCINE) 1000 UNIT/ML DIALYSIS: 1000.0000 [IU] | INTRAMUSCULAR | Status: DC | PRN

## 2016-12-01 MED ORDER — LIDOCAINE-PRILOCAINE 2.5-2.5 % EX CREA
1.0000 "application " | TOPICAL_CREAM | CUTANEOUS | Status: DC | PRN
  Filled 2016-12-01: qty 5

## 2016-12-01 NOTE — Progress Notes (Signed)
  Echocardiogram Echocardiogram Transesophageal has been performed.  Bobbye Charleston 12/01/2016, 9:31 AM

## 2016-12-01 NOTE — H&P (View-Only) (Signed)
PROGRESS NOTE    Alexander [name]  CBJ:[id number] DOB: [birth year] DOA: 11/23/2016 PCP: Deland Pretty, MD    Brief Narrative:  Alexander [name] is a 76 y.o. male with medical history significant of anemia, aortic stenosis, PUD, GERD, Barrett's esophagus, carpal tunnel syndrome, paroxysmal atrial fibrillation, history of bradycardia (S/Pdual lead pacemaker placement), CVA, depression, type 2 diabetes, diabetic peripheral neuropathy, diverticulosis, ESRD on hemodialysis, glaucoma, hyperlipidemia, hypertension, nephrolithiasis, macular degeneration/legally blind, seasonal allergies, tubular adenoma of the colon who is coming to the emergency department generalized body aches and malaise, muscle spasms of all extremities since monday after he finished hemodialysis. He was admitted for evaluation of meningitis, to step down. Meanwhile he developed a fib with RVR and cardizem gtt was started.   On 8/ 29 afternoon, pt became encephalopathic after he received 2.5 mg of valium , became unresponsive and hypoxic. Dr Florene Glen ordered flumazenil and he reverted back to baseline. Valium was discontinued.  Blood cultures from both aerobic and anaerobic bottles are positive for streptococcus species.  ID Dr Megan Salon consulted for appropriate antibiotics. He is currently on gentamicin , rocephin .  8/31, PT eval ordered.  9/1 right hip pain, CT of the right hip ordered negative for acute pathology.  9/5 plan for TEE IN AM.   Assessment & Plan:   Principal Problem:   Streptococcal bacteremia Active Problems:   Anemia, unspecified   Severe aortic stenosis   PAD (peripheral artery disease) (HCC)   ESRD (end stage renal disease) on dialysis (HCC)   Hyperlipidemia   Diabetic peripheral neuropathy (HCC)   S/P TAVR (transcatheter aortic valve replacement)   Atrial fibrillation with RVR (HCC)   Glaucoma   GERD (gastroesophageal reflux disease)   Hypomagnesemia   Type 2 diabetes mellitus (HCC)   Depression   SIRS  (systemic inflammatory response syndrome) (HCC)   Presence of permanent cardiac pacemaker  Generalized malaise, neck pain/  mild rhabdomyolysis.  Sepsis: from streptococcal bacteremia.  Suspected all this is due to STREPTOCOCCUS GALLOLYTICUS bacteremia. CT head without contrast showed old infarcts, repeat CT of the head was also negative for acute pathology, CT neck shows spondylosis.  CT chest without contrast does not show any pneumonia.  Started on IV meropenem and vancomycin, changed to rocephin and gentamicin.  ID consulted, recommended getting TTE and possible repeat CT cervical spine with contrast, discussed with DR Jonnie Finner,  Radiology and patient's daughter and ordered repeat CT cervical spine with contrast to evaluate for discitis and epidural abscess, which was negative for abscess.  Currently afebrile. Leukocytosis with left shift seen, but improving.  Continue with the same management as above.  Echocardiogram ordered and it did not show endocarditis. Cardiology for TEE, possibly on 9/5 Repeat blood cultures have been negative so far.  No change in medications.      Afib with RVR, pt has a h/o PAF,: Rate not well controlled, increased the dose of metoprolol to 50 mg BID and added cardizem. His rate is much better.  Was on coumadin and his INR IS still supra therapeutic. Patient started to have nose bleeds. Ordered 5 mg vitamin K. His INR is better and pharmacy to dose coumadin.   Elevated troponin's; From demand ischemia from afib with RVR.  Pt denies any chest pain.   ESRD on HD:  Nephrologist on board . Further management  As per renal.    Anemia of chronic disease:  Hemoglobin around 10. Monitor.   TYPE 2 DM:  CBG (last 3)   Recent  Labs  11/29/16 2109 11/30/16 0813 11/30/16 1230  GLUCAP 102* 80 138*    Resume SSI. No change in medications.    H/o severe AS, s/p TAVR:  Was on coumadin at home, INR supra therapeutic till today. Had nose bleeds all day ,  had to give him vitamin K, and his INR is sub therapeutic.  Pharmacy to dose Coumadin  GERD:  Resume PPI.    HYPOTENSION; on midodrine.  Asymptomatic. Resolved.   Leukocytosis: suspect its from the bacteremia. Wbc is normal.   Right hip pain: CT of the right hip ordered by ID and is negative for acute pathology.  - pain control with IV toradol.     DVT prophylaxis:heparin sq Code Status: (Full code) Family Communication: wife  at bedside.  Disposition Plan: SNF in 1 to 2 days after evaluation by TEE.    Consultants:   Nephrology  ID  PCCM over the phone.   Procedures: HD  Antimicrobials: MEROPENAM till 8/29.  Gentamicin from 8/30 vancomycin from admission till 8/31 Rocephin from 8/31. .   Subjective: In good spirits. No chest pain or sob.    Objective: Vitals:   11/30/16 0238 11/30/16 0700 11/30/16 0718 11/30/16 1155  BP: 126/61  119/83 102/77  Pulse:   98 76  Resp:   14 13  Temp: 97.6 F (36.4 C) 97.6 F (36.4 C)  97.6 F (36.4 C)  TempSrc: Oral Oral  Oral  SpO2:   96% 99%  Weight:      Height:        Intake/Output Summary (Last 24 hours) at 11/30/16 1410 Last data filed at 11/30/16 1030  Gross per 24 hour  Intake           353.75 ml  Output             1550 ml  Net         -1196.25 ml   Filed Weights   11/27/16 1246 11/29/16 1525 11/29/16 1907  Weight: 88.1 kg (194 lb 3.6 oz) 90 kg (198 lb 6.6 oz) 88.6 kg (195 lb 5.2 oz)    Examination:  General exam:  Alert and comfortable. Not in any distress. Not on oxygen.  Respiratory system:clear to auscultation. No wheezing or rhonchi.  Cardiovascular system:S1S2, irregular, no pedal edema.  Gastrointestinal system: Abdomen  Soft NT ND BS+ Central nervous system: Alert and oriented. No new deficits.  Extremities: Symmetric 5 x 5 power. No cyanosis or clubbing.  Skin: No rashes, lesions or ulcers Psychiatry: Mood & affect appropriate.     Data Reviewed: I have personally reviewed following labs  and imaging studies  CBC:  Recent Labs Lab 11/24/16 0312 11/24/16 2326 11/25/16 0513 11/26/16 0138 11/28/16 0346 11/29/16 1648  WBC 13.1* 13.6* 14.0* 12.9* 10.9* 10.3  NEUTROABS 11.5*  --  12.8* 11.6*  --   --   HGB 10.0* 9.7* 9.9* 10.1* 10.7* 10.0*  HCT 31.1* 29.9* 29.8* 31.3* 32.5* 30.5*  MCV 96.6 95.2 94.9 94.8 93.7 93.6  PLT 103* 124* 133* 166 195 161   Basic Metabolic Panel:  Recent Labs Lab 11/23/16 1936 11/24/16 0604 11/24/16 2326 11/25/16 0513 11/26/16 0138 11/28/16 0346 11/29/16 1648  NA  --  136 134* 137 136 134* 133*  K  --  4.2 4.0 3.5 3.7 4.2 4.1  CL  --  98* 97* 100* 96* 94* 93*  CO2  --  24 22 24 26 25 25   GLUCOSE  --  174* 170* 121* 140*  108* 119*  BUN  --  50* 68* 24* 45* 49* 63*  CREATININE  --  8.15* 8.95* 4.09* 5.77* 6.13* 7.23*  CALCIUM  --  7.9* 7.7* 8.0* 8.2* 8.2* 7.8*  MG 1.5*  --   --   --   --   --   --   PHOS  --  4.1 4.4 3.5 3.5  --  4.9*   GFR: Estimated Creatinine Clearance: 9.1 mL/min (A) (by C-G formula based on SCr of 7.23 mg/dL (H)). Liver Function Tests:  Recent Labs Lab 11/23/16 1936 11/24/16 0604 11/24/16 2326 11/25/16 0513 11/26/16 0138 11/29/16 1648  AST 55*  --   --   --   --   --   ALT 37  --   --   --   --   --   ALKPHOS 70  --   --   --   --   --   BILITOT 1.2  --   --   --   --   --   PROT 6.9  --   --   --   --   --   ALBUMIN 3.0* 2.6* 2.5* 2.5* 2.4* 2.4*   No results for input(s): LIPASE, AMYLASE in the last 168 hours. No results for input(s): AMMONIA in the last 168 hours. Coagulation Profile:  Recent Labs Lab 11/27/16 0227 11/28/16 0346 11/28/16 2327 11/29/16 0539 11/30/16 0225  INR 5.47* 8.84* 1.81 1.55 1.44   Cardiac Enzymes:  Recent Labs Lab 11/23/16 1936 11/24/16 0312 11/24/16 0604  CKTOTAL 630*  --   --   TROPONINI 0.09* 0.07* 0.07*   BNP (last 3 results)  Recent Labs  11/11/16 1143  PROBNP 18,028*   HbA1C: No results for input(s): HGBA1C in the last 72  hours. CBG:  Recent Labs Lab 11/29/16 0806 11/29/16 1149 11/29/16 2109 11/30/16 0813 11/30/16 1230  GLUCAP 83 109* 102* 80 138*   Lipid Profile: No results for input(s): CHOL, HDL, LDLCALC, TRIG, CHOLHDL, LDLDIRECT in the last 72 hours. Thyroid Function Tests: No results for input(s): TSH, T4TOTAL, FREET4, T3FREE, THYROIDAB in the last 72 hours. Anemia Panel: No results for input(s): VITAMINB12, FOLATE, FERRITIN, TIBC, IRON, RETICCTPCT in the last 72 hours. Sepsis Labs:  Recent Labs Lab 11/23/16 1951  LATICACIDVEN 1.23    Recent Results (from the past 240 hour(s))  Blood Culture (routine x 2)     Status: Abnormal   Collection Time: 11/23/16  8:15 PM  Result Value Ref Range Status   Specimen Description BLOOD RIGHT ANTECUBITAL  Final   Special Requests   Final    BOTTLES DRAWN AEROBIC AND ANAEROBIC Blood Culture adequate volume   Culture  Setup Time   Final    GRAM POSITIVE COCCI IN PAIRS AND CHAINS IN BOTH AEROBIC AND ANAEROBIC BOTTLES CRITICAL VALUE NOTED.  VALUE IS CONSISTENT WITH PREVIOUSLY REPORTED AND CALLED VALUE.    Culture (A)  Final    STREPTOCOCCUS GALLOLYTICUS SUSCEPTIBILITIES PERFORMED ON PREVIOUS CULTURE WITHIN THE LAST 5 DAYS.    Report Status 11/26/2016 FINAL  Final  Blood Culture (routine x 2)     Status: Abnormal   Collection Time: 11/23/16  8:22 PM  Result Value Ref Range Status   Specimen Description BLOOD RIGHT WRIST  Final   Special Requests   Final    BOTTLES DRAWN AEROBIC AND ANAEROBIC Blood Culture adequate volume   Culture  Setup Time   Final    GRAM POSITIVE COCCI IN PAIRS AND CHAINS  IN BOTH AEROBIC AND ANAEROBIC BOTTLES CRITICAL RESULT CALLED TO, READ BACK BY AND VERIFIED WITH: M. Radford Pax Pharm.D. 10:35 11/24/16 ([email or web address])    Culture STREPTOCOCCUS GALLOLYTICUS (A)  Final   Report Status 11/26/2016 FINAL  Final   Organism ID, Bacteria STREPTOCOCCUS GALLOLYTICUS  Final      Susceptibility   Streptococcus gallolyticus - MIC*     PENICILLIN 0.12 SENSITIVE Sensitive     CEFTRIAXONE <=0.12 SENSITIVE Sensitive     ERYTHROMYCIN INTERMEDIATE Intermediate     LEVOFLOXACIN 2 SENSITIVE Sensitive     VANCOMYCIN 0.5 SENSITIVE Sensitive     * STREPTOCOCCUS GALLOLYTICUS  Blood Culture ID Panel (Reflexed)     Status: Abnormal   Collection Time: 11/23/16  8:22 PM  Result Value Ref Range Status   Enterococcus species NOT DETECTED NOT DETECTED Final   Listeria monocytogenes NOT DETECTED NOT DETECTED Final   Staphylococcus species NOT DETECTED NOT DETECTED Final   Staphylococcus aureus NOT DETECTED NOT DETECTED Final   Streptococcus species DETECTED (A) NOT DETECTED Final    Comment: Not Enterococcus species, Streptococcus agalactiae, Streptococcus pyogenes, or Streptococcus pneumoniae. CRITICAL RESULT CALLED TO, READ BACK BY AND VERIFIED WITH: M. Radford Pax Pharm.D. 10:35 11/24/16 ([email or web address])    Streptococcus agalactiae NOT DETECTED NOT DETECTED Final   Streptococcus pneumoniae NOT DETECTED NOT DETECTED Final   Streptococcus pyogenes NOT DETECTED NOT DETECTED Final   Acinetobacter baumannii NOT DETECTED NOT DETECTED Final   Enterobacteriaceae species NOT DETECTED NOT DETECTED Final   Enterobacter cloacae complex NOT DETECTED NOT DETECTED Final   Escherichia coli NOT DETECTED NOT DETECTED Final   Klebsiella oxytoca NOT DETECTED NOT DETECTED Final   Klebsiella pneumoniae NOT DETECTED NOT DETECTED Final   Proteus species NOT DETECTED NOT DETECTED Final   Serratia marcescens NOT DETECTED NOT DETECTED Final   Haemophilus influenzae NOT DETECTED NOT DETECTED Final   Neisseria meningitidis NOT DETECTED NOT DETECTED Final   Pseudomonas aeruginosa NOT DETECTED NOT DETECTED Final   Candida albicans NOT DETECTED NOT DETECTED Final   Candida glabrata NOT DETECTED NOT DETECTED Final   Candida krusei NOT DETECTED NOT DETECTED Final   Candida parapsilosis NOT DETECTED NOT DETECTED Final   Candida tropicalis NOT DETECTED NOT DETECTED Final   MRSA PCR Screening     Status: None   Collection Time: 11/24/16  3:25 AM  Result Value Ref Range Status   MRSA by PCR NEGATIVE NEGATIVE Final    Comment:        The GeneXpert MRSA Assay (FDA approved for NASAL specimens only), is one component of a comprehensive MRSA colonization surveillance program. It is not intended to diagnose MRSA infection nor to guide or monitor treatment for MRSA infections.   Culture, blood (routine x 2)     Status: None (Preliminary result)   Collection Time: 11/25/16 10:18 AM  Result Value Ref Range Status   Specimen Description BLOOD RIGHT WRIST  Final   Special Requests   Final    BOTTLES DRAWN AEROBIC AND ANAEROBIC Blood Culture adequate volume   Culture NO GROWTH 4 DAYS  Final   Report Status PENDING  Incomplete  Culture, blood (routine x 2)     Status: None (Preliminary result)   Collection Time: 11/25/16 10:18 AM  Result Value Ref Range Status   Specimen Description BLOOD RIGHT HAND  Final   Special Requests   Final    BOTTLES DRAWN AEROBIC AND ANAEROBIC Blood Culture adequate volume   Culture NO GROWTH 4 DAYS  Final   Report  Status PENDING  Incomplete         Radiology Studies: No results found.      Scheduled Meds: . atorvastatin  20 mg Oral Daily  . bromocriptine  5 mg Oral QHS  . calcium acetate  667 mg Oral TID WC  . cinacalcet  30 mg Oral QPM  . diltiazem  30 mg Oral Q12H  . dorzolamide-timolol  1 drop Both Eyes BID  . loratadine  10 mg Oral Daily  . mouth rinse  15 mL Mouth Rinse BID  . metoprolol tartrate  50 mg Oral BID  . midodrine  15 mg Oral Q M,W,F-HD  . pantoprazole  40 mg Oral Daily  . polyethylene glycol  17 g Oral Daily  . predniSONE  5 mg Oral Daily  . sertraline  50 mg Oral Daily  . warfarin  5 mg Oral ONCE-1800  . Warfarin - Pharmacist Dosing Inpatient   Does not apply q1800   Continuous Infusions: . cefTRIAXone (ROCEPHIN)  IV Stopped (11/29/16 2038)  . gentamicin Stopped (11/29/16 2039)  .  methocarbamol (ROBAXIN)  IV Stopped (11/27/16 1209)     LOS: 7 days    Time spent: 35 minutes.    Hosie Poisson, MD [facility] Pager [phone]   If 7PM-7AM, please contact night-coverage [email or web address] Password [email or web address] 11/30/2016, 2:10 PM

## 2016-12-01 NOTE — Progress Notes (Signed)
[facility] Progress Note  Subjective: on HD, no c/o.  Had TTE, no vegetations.   Vitals:   12/01/16 0929 12/01/16 0930 12/01/16 0934 12/01/16 1206  BP:   122/64 92/72  Pulse:   82 (!) 104  Resp: _0 (!) 21  Temp:   98.1 F (36.7 C) 97.9 F (36.6 C)  TempSrc:   Oral Oral  SpO2:   93% 96%  Weight:      Height:        Inpatient medications: . atorvastatin  20 mg Oral Daily  . bromocriptine  5 mg Oral QHS  . calcium acetate  667 mg Oral TID WC  . cinacalcet  30 mg Oral QPM  . diltiazem  30 mg Oral Q12H  . dorzolamide-timolol  1 drop Both Eyes BID  . [START ON 12/02/2016] heparin  2,800 Units Dialysis Once in dialysis  . loratadine  10 mg Oral Daily  . mouth rinse  15 mL Mouth Rinse BID  . metoprolol tartrate  50 mg Oral BID  . midodrine  15 mg Oral Q M,W,F-HD  . pantoprazole  40 mg Oral Daily  . polyethylene glycol  17 g Oral Daily  . predniSONE  5 mg Oral Daily  . sertraline  50 mg Oral Daily  . warfarin  5 mg Oral ONCE-1800  . Warfarin - Pharmacist Dosing Inpatient   Does not apply q1800   . sodium chloride    . sodium chloride    . cefTRIAXone (ROCEPHIN)  IV Stopped (11/30/16 1915)  . gentamicin Stopped (11/29/16 2039)  . methocarbamol (ROBAXIN)  IV Stopped (11/27/16 1209)   sodium chloride, sodium chloride, acetaminophen, alteplase, bisacodyl, bisacodyl, heparin, lidocaine (PF), lidocaine-prilocaine, methocarbamol (ROBAXIN)  IV, morphine injection, MUSCLE RUB, ondansetron **OR** ondansetron (ZOFRAN) IV, pentafluoroprop-tetrafluoroeth, senna-docusate  Exam: NAD, elderly male No jvd Chest clear bilat RRR irreg irreg rhythm, soft murmur Abd soft ntnd no ascites Ext no edema Neuro is NF, Ox 3   CT chest - negative ECHO - no vegetation CT neck - spondylitic changes, no discitis/ abscess  Dialysis: [facility] M[facility] 4h   88.5kg   Hep 2800  LFA AVF -mircera 50 mg last on 8/15, last Hb 12 on 8/22      Impression: 1  ESRD  - HD MWF 2  Strep  gallolyticus bacteremia - sp TEE, no vegetations.  3  AFib w/ RVR - on coumadin, dilt/ MTP po 4  Chron hypotension - on midodrine 15 pre HD tiw 5  DM2 6  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/ mac degen 7  Depression on Zoloft 8  Hist of TAVRS (June 2018) 9  SP PPM 10 Cleora Fleet - demand ischem 11  Vol - at dry wt, prob can lower a bit  Plan - HD today, min UF 1-2 L   Kelly Splinter MD [place] pager [phone]   12/01/2016, 2:57 PM    Recent Labs Lab 11/26/16 0138 11/28/16 0346 11/29/16 1648 12/01/16 1141  NA 136 134* 133* 137  K 3.7 4.2 4.1 5.5*  CL 96* 94* 93* 97*  CO2 _1 GLUCOSE 140* 108* 119* 102*  BUN 45* 49* 63* 53*  CREATININE 5.77* 6.13* 7.23* 6.86*  CALCIUM 8.2* 8.2* 7.8* 7.9*  PHOS 3.5  --  4.9* 5.4*    Recent Labs Lab 11/26/16 0138 11/29/16 1648 12/01/16 1141  ALBUMIN 2.4* 2.4* 2.4*    Recent Labs Lab 11/25/16 0513 11/26/16 0138 11/28/16 0346 11/29/16 1648  WBC 14.0*  12.9* 10.9* 10.3  NEUTROABS 12.8* 11.6*  --   --   HGB 9.9* 10.1* 10.7* 10.0*  HCT 29.8* 31.3* 32.5* 30.5*  MCV 94.9 94.8 93.7 93.6  PLT 133* 166 195 226   Iron/TIBC/Ferritin/ %Sat No results found for: IRON, TIBC, FERRITIN, IRONPCTSAT

## 2016-12-01 NOTE — CV Procedure (Signed)
Procedure: TEE  Indication: Bacteremia, r/o endocarditis.   Sedation: Versed 2 mg IV, Fentanyl 25 mcg IV  Findings: Please see echo section for full report.  Normal LV size with EF 50%.  Mild septal hypokinesis.  The RV was mildly dilated with mildly decreased systolic function.  Pacemaker on right side of heart, no vegetation noted on leads.  Severe left atrial enlargement, smoke but no thrombus in LA appendage.  Moderate right atrial enlargement.  Lipomatous interatrial septal hypertrophy.  No ASD/PFO by color doppler.  Moderate TR, no vegetation.  Peak RV-RA gradient 27 mmHg.  Moderate calcified mitral valve and annulus.  There was moderate MR with ERO 0.33 cm^2 by PISA.  Mean gradient 7 mmHg across MV but short pressure half-time, doubt significant stenosis (elevated gradient may be due to high flow from MR).  No MV vegetation.  The patient is s/p TAVR with bioprosthetic aortic valve.  The valve opens well, difficult transgastric images so did not get a mean gradient.  There was trivial peri-valvular regurgitation.  No vegetation noted on aortic valve.  Grade III plaque in descending thoracic aorta, normal caliber thoracic aorta .  No evidence for endocarditis.   Alexander [name] 12/01/2016 9:28 AM

## 2016-12-01 NOTE — Progress Notes (Addendum)
[facility] Progress Note  Subjective: up in chair today, no c/o  Vitals:   12/01/16 0929 12/01/16 0930 12/01/16 0934 12/01/16 1206  BP:   122/64 92/72  Pulse:   82 (!) 104  Resp: 15 15 17  (!) 21  Temp:   98.1 F (36.7 C) 97.9 F (36.6 C)  TempSrc:   Oral Oral  SpO2:   93% 96%  Weight:      Height:        Inpatient medications: . atorvastatin  20 mg Oral Daily  . bromocriptine  5 mg Oral QHS  . calcium acetate  667 mg Oral TID WC  . cinacalcet  30 mg Oral QPM  . diltiazem  30 mg Oral Q12H  . dorzolamide-timolol  1 drop Both Eyes BID  . [START ON 12/02/2016] heparin  2,800 Units Dialysis Once in dialysis  . loratadine  10 mg Oral Daily  . mouth rinse  15 mL Mouth Rinse BID  . metoprolol tartrate  50 mg Oral BID  . midodrine  15 mg Oral Q M,W,F-HD  . pantoprazole  40 mg Oral Daily  . polyethylene glycol  17 g Oral Daily  . predniSONE  5 mg Oral Daily  . sertraline  50 mg Oral Daily  . warfarin  5 mg Oral ONCE-1800  . Warfarin - Pharmacist Dosing Inpatient   Does not apply q1800   . sodium chloride    . sodium chloride    . cefTRIAXone (ROCEPHIN)  IV Stopped (11/30/16 1915)  . gentamicin Stopped (11/29/16 2039)  . methocarbamol (ROBAXIN)  IV Stopped (11/27/16 1209)   sodium chloride, sodium chloride, acetaminophen, alteplase, bisacodyl, bisacodyl, heparin, lidocaine (PF), lidocaine-prilocaine, methocarbamol (ROBAXIN)  IV, morphine injection, MUSCLE RUB, ondansetron **OR** ondansetron (ZOFRAN) IV, pentafluoroprop-tetrafluoroeth, senna-docusate  Exam: NAD, elderly male No jvd Chest clear bilat RRR irreg irreg rhythm, soft murmur Abd soft ntnd no ascites Ext no edema Neuro is NF, Ox 3   CT chest - negative ECHO - no vegetation CT neck - spondylitic changes, no discitis/ abscess  Dialysis: [facility] 4h   88.5kg   Hep 2800  LFA AVF -mircera 50 mg last on 8/15, last Hb 12 on 8/22      Impression: 1  ESRD  - HD MWF 2  Strep gallolyticus bacteremia -  TEE tomorrow 3  AFib w/ RVR - on coumadin, dilt/ MTP po 4  Chron hypotension - on midodrine 15 pre HD tiw 5  DM2 6  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/ mac degen 7  Depression on Zoloft 8  Hist of TAVRS (June 2018) 9  SP PPM 10 Cleora Fleet - demand ischem 11  Vol - is at dry wt  Plan - HD tomorrow   Kelly Splinter MD [facility] pager [phone]   11/30/2016, 2:59 PM    Recent Labs Lab 11/26/16 0138 11/28/16 0346 11/29/16 1648 12/01/16 1141  NA 136 134* 133* 137  K 3.7 4.2 4.1 5.5*  CL 96* 94* 93* 97*  CO2 26 25 25 23   GLUCOSE 140* 108* 119* 102*  BUN 45* 49* 63* 53*  CREATININE 5.77* 6.13* 7.23* 6.86*  CALCIUM 8.2* 8.2* 7.8* 7.9*  PHOS 3.5  --  4.9* 5.4*    Recent Labs Lab 11/26/16 0138 11/29/16 1648 12/01/16 1141  ALBUMIN 2.4* 2.4* 2.4*    Recent Labs Lab 11/25/16 0513 11/26/16 0138 11/28/16 0346 11/29/16 1648  WBC 14.0* 12.9* 10.9* 10.3  NEUTROABS 12.8* 11.6*  --   --   HGB  9.9* 10.1* 10.7* 10.0*  HCT 29.8* 31.3* 32.5* 30.5*  MCV 94.9 94.8 93.7 93.6  PLT 133* 166 195 226   Iron/TIBC/Ferritin/ %Sat No results found for: IRON, TIBC, FERRITIN, IRONPCTSAT

## 2016-12-01 NOTE — Progress Notes (Signed)
[place] for warfarin Indication: atrial fibrillation and stroke  Allergies  Allergen Reactions  . Penicillins Swelling    SWELLING REACTION UNSPECIFIED   PATIENT HAD A PCN REACTION WITH IMMEDIATE RASH, FACIAL/TONGUE/THROAT SWELLING, SOB, OR LIGHTHEADEDNESS WITH HYPOTENSION:  #  #  #  YES  #  #  #   Has patient had a PCN reaction causing severe rash involving mucus membranes or skin necrosis: No Has patient had a PCN reaction that required hospitalization No Has patient had a PCN reaction occurring within the last 10 years: No If all of the above answers are "NO", then may proceed with Cephalosporin use.  . Codeine Nausea Only  . Tramadol Nausea Only    Patient Measurements: Height: 5' 10.5" (179.1 cm) Weight: 195 lb 5.2 oz (88.6 kg) IBW/kg (Calculated) : 74.15  Vital Signs: Temp: 98.1 F (36.7 C) (09/05 0934) Temp Source: Oral (09/05 0934) BP: 122/64 (09/05 0934) Pulse Rate: 82 (09/05 0934)  Labs:  Recent Labs  11/29/16 0539 11/29/16 1648 11/30/16 0225 12/01/16 0311  HGB  --  10.0*  --   --   HCT  --  30.5*  --   --   PLT  --  226  --   --   LABPROT 18.4*  --  17.5* 19.0*  INR 1.55  --  1.44 1.61  CREATININE  --  7.23*  --   --     Estimated Creatinine Clearance: 9.1 mL/min (A) (by C-G formula based on SCr of 7.23 mg/dL (H)).    Assessment: 76 year old male with history of afib and cva on chronic coumadin prior to admission. INR has been elevated since admission and peaked at 7. Patient had nose bleed 9/2 and 5mg  of vitamin k was given total.   INR today = 1.61, no new bleeding noted  Goal of Therapy:  INR 2-3 Monitor platelets by anticoagulation protocol: Yes   Plan:  Warfarin 5mg  tonight Daily INR  Thank you Anette Guarneri, PharmD [phone] 12/01/2016 10:03 AM

## 2016-12-01 NOTE — Clinical Social Work Note (Addendum)
Bed offers provided to patient's wife and daughter. They have chosen [facility]. [facility] is able to transport patient to dialysis. Admissions coordinator notified of acceptance of bed offer. Per patient's daughter, they were told patient will likely discharge tomorrow or Friday. They are hopeful for Friday as the patient's wife has her first chemo treatment tomorrow. PASARR still pending.  Dayton Scrape, [place] [phone]  12:40 pm CSW faxed requested documents to [facility] Must for review for PASARR.  Dayton Scrape, [place]  12:56 pm PASARR obtained: [phone].  Dayton Scrape, [place] [address]

## 2016-12-01 NOTE — Progress Notes (Signed)
PROGRESS NOTE    Alexander [name]  WVP:[id number] DOB: 16-Dec-1940 DOA: 11/23/2016 PCP: Deland Pretty, MD    Subjective: Seen with his wife and daughter at bedside, denies any complaints, patient was sleepy. TEE done earlier today and showed no vegetations. Likely to be able to discharge to SNF in a.m.  Brief Narrative:  Alexander [name] is a 76 y.o. male with medical history significant of anemia, aortic stenosis, PUD, GERD, Barrett's esophagus, carpal tunnel syndrome, paroxysmal atrial fibrillation, history of bradycardia (S/Pdual lead pacemaker placement), CVA, depression, type 2 diabetes, diabetic peripheral neuropathy, diverticulosis, ESRD on hemodialysis, glaucoma, hyperlipidemia, hypertension, nephrolithiasis, macular degeneration/legally blind, seasonal allergies, tubular adenoma of the colon who is coming to the emergency department generalized body aches and malaise, muscle spasms of all extremities since monday after he finished hemodialysis. He was admitted for evaluation of meningitis, to step down. Meanwhile he developed a fib with RVR and cardizem gtt was started.   On 8/ 29 afternoon, pt became encephalopathic after he received 2.5 mg of valium , became unresponsive and hypoxic. Dr Florene Glen ordered flumazenil and he reverted back to baseline. Valium was discontinued.  Blood cultures from both aerobic and anaerobic bottles are positive for streptococcus species.  ID Dr Megan Salon consulted for appropriate antibiotics. He is currently on gentamicin , rocephin .  8/31, PT eval ordered.  9/1 right hip pain, CT of the right hip ordered negative for acute pathology.  9/5 plan for TEE IN AM.   Assessment & Plan:   Principal Problem:   Streptococcal bacteremia Active Problems:   Anemia, unspecified   Severe aortic stenosis   PAD (peripheral artery disease) (HCC)   ESRD (end stage renal disease) on dialysis (HCC)   Hyperlipidemia   Diabetic peripheral neuropathy (HCC)   S/P TAVR  (transcatheter aortic valve replacement)   Atrial fibrillation with RVR (HCC)   Glaucoma   GERD (gastroesophageal reflux disease)   Hypomagnesemia   Type 2 diabetes mellitus ([place])   Depression   SIRS (systemic inflammatory response syndrome) (HCC)   Presence of permanent cardiac pacemaker   Sepsis:  -Presented with temperature of 103.1 and heart rate of 114 along with infection. -Has elevated lactate with evidence of end organ damage. -This is secondary to Streptococcus bacteremia -Sepsis physiology resolved.  Streptococcus bacteremia -Unclear source (HD patient), patient had CT scan of the C-spine showed no evidence of discitis/abscess. -T was done earlier today and showed no evidence of vegetation. -This is treated initially by meropenem and vancomycin, changed to Rocephin and gentamicin. -Per ID a.m. for HD amenable antibiotics, discharge is going to be likely in a.m. to [facility].  Afib with RVR, pt has a h/o PAF,: -RVR on admission likely secondary to hypotension and sepsis, metoprolol increased to 50 mg and Cardizem added. -Currently rate is controlled. -CHA2DS2-VASc is greater than 3, continue Coumadin. INR 1.6 today.  Elevated troponin -Slightly elevated troponin, 0.09 was a peak, showed flat trend. -The curve is not consistent with acute coronary syndrome, this is likely poor secretion secondary to ESRD.  ESRD -Dialysis on Monday, Wednesday and Friday.  Anemia of chronic disease:  Hemoglobin around 10. Monitor.   TYPE 2 DM:  Resume SSI. No change in medications.   H/o severe AS, s/p TAVR:  -History of TAVR, for severe AS, on Coumadin, continued.   GERD:  Resume PPI.   HYPOTENSION; on midodrine.  Asymptomatic. Resolved.   Leukocytosis: suspect its from the bacteremia. Wbc is normal.   Right hip  pain: CT of the right hip ordered by ID and is negative for acute pathology.  - pain control with IV toradol.     DVT prophylaxis:heparin  sq Code Status: (Full code) Family Communication: wife  at bedside.  Disposition Plan: SNF in 1 to 2 days after evaluation by TEE.    Consultants:   Nephrology  ID  PCCM over the phone.   Procedures: HD  Antimicrobials: MEROPENAM till 8/29.  Gentamicin from 8/30 vancomycin from admission till 8/31 Rocephin from 8/31. .    Objective: Vitals:   12/01/16 0925 12/01/16 0928 12/01/16 0934 12/01/16 1206  BP: (!) 96/59  122/64 92/72  Pulse: (!) 111 (!) 53 82 (!) 104  Resp: (!) 21 13 17  (!) 21  Temp:   98.1 F (36.7 C) 97.9 F (36.6 C)  TempSrc:   Oral Oral  SpO2: 100% 100% 93% 96%  Weight:      Height:        Intake/Output Summary (Last 24 hours) at 12/01/16 1211 Last data filed at 11/30/16 2000  Gross per 24 hour  Intake              530 ml  Output                0 ml  Net              530 ml   Filed Weights   11/27/16 1246 11/29/16 1525 11/29/16 1907  Weight: 88.1 kg (194 lb 3.6 oz) 90 kg (198 lb 6.6 oz) 88.6 kg (195 lb 5.2 oz)    Examination:  General exam:  Alert and comfortable. Not in any distress. Not on oxygen.  Respiratory system:clear to auscultation. No wheezing or rhonchi.  Cardiovascular system:S1S2, irregular, no pedal edema.  Gastrointestinal system: Abdomen  Soft NT ND BS+ Central nervous system: Alert and oriented. No new deficits.  Extremities: Symmetric 5 x 5 power. No cyanosis or clubbing.  Skin: No rashes, lesions or ulcers Psychiatry: Mood & affect appropriate.     Data Reviewed: I have personally reviewed following labs and imaging studies  CBC:  Recent Labs Lab 11/24/16 2326 11/25/16 0513 11/26/16 0138 11/28/16 0346 11/29/16 1648  WBC 13.6* 14.0* 12.9* 10.9* 10.3  NEUTROABS  --  12.8* 11.6*  --   --   HGB 9.7* 9.9* 10.1* 10.7* 10.0*  HCT 29.9* 29.8* 31.3* 32.5* 30.5*  MCV 95.2 94.9 94.8 93.7 93.6  PLT 124* 133* 166 195 660   Basic Metabolic Panel:  Recent Labs Lab 11/24/16 2326 11/25/16 0513 11/26/16 0138  11/28/16 0346 11/29/16 1648  NA 134* 137 136 134* 133*  K 4.0 3.5 3.7 4.2 4.1  CL 97* 100* 96* 94* 93*  CO2 22 24 26 25 25   GLUCOSE 170* 121* 140* 108* 119*  BUN 68* 24* 45* 49* 63*  CREATININE 8.95* 4.09* 5.77* 6.13* 7.23*  CALCIUM 7.7* 8.0* 8.2* 8.2* 7.8*  PHOS 4.4 3.5 3.5  --  4.9*   GFR: Estimated Creatinine Clearance: 9.1 mL/min (A) (by C-G formula based on SCr of 7.23 mg/dL (H)). Liver Function Tests:  Recent Labs Lab 11/24/16 2326 11/25/16 0513 11/26/16 0138 11/29/16 1648  ALBUMIN 2.5* 2.5* 2.4* 2.4*   No results for input(s): LIPASE, AMYLASE in the last 168 hours. No results for input(s): AMMONIA in the last 168 hours. Coagulation Profile:  Recent Labs Lab 11/28/16 0346 11/28/16 2327 11/29/16 0539 11/30/16 0225 12/01/16 0311  INR 8.84* 1.81 1.55 1.44 1.61   Cardiac Enzymes:  No results for input(s): CKTOTAL, CKMB, CKMBINDEX, TROPONINI in the last 168 hours. BNP (last 3 results)  Recent Labs  11/11/16 1143  PROBNP 18,028*   HbA1C: No results for input(s): HGBA1C in the last 72 hours. CBG:  Recent Labs Lab 11/30/16 1230 11/30/16 1711 11/30/16 2114 12/01/16 1003 12/01/16 1202  GLUCAP 138* 167* 172* 124* 113*   Lipid Profile: No results for input(s): CHOL, HDL, LDLCALC, TRIG, CHOLHDL, LDLDIRECT in the last 72 hours. Thyroid Function Tests: No results for input(s): TSH, T4TOTAL, FREET4, T3FREE, THYROIDAB in the last 72 hours. Anemia Panel: No results for input(s): VITAMINB12, FOLATE, FERRITIN, TIBC, IRON, RETICCTPCT in the last 72 hours. Sepsis Labs: No results for input(s): PROCALCITON, LATICACIDVEN in the last 168 hours.  Recent Results (from the past 240 hour(s))  Blood Culture (routine x 2)     Status: Abnormal   Collection Time: 11/23/16  8:15 PM  Result Value Ref Range Status   Specimen Description BLOOD RIGHT ANTECUBITAL  Final   Special Requests   Final    BOTTLES DRAWN AEROBIC AND ANAEROBIC Blood Culture adequate volume   Culture   Setup Time   Final    GRAM POSITIVE COCCI IN PAIRS AND CHAINS IN BOTH AEROBIC AND ANAEROBIC BOTTLES CRITICAL VALUE NOTED.  VALUE IS CONSISTENT WITH PREVIOUSLY REPORTED AND CALLED VALUE.    Culture (A)  Final    STREPTOCOCCUS GALLOLYTICUS SUSCEPTIBILITIES PERFORMED ON PREVIOUS CULTURE WITHIN THE LAST 5 DAYS.    Report Status 11/26/2016 FINAL  Final  Blood Culture (routine x 2)     Status: Abnormal   Collection Time: 11/23/16  8:22 PM  Result Value Ref Range Status   Specimen Description BLOOD RIGHT WRIST  Final   Special Requests   Final    BOTTLES DRAWN AEROBIC AND ANAEROBIC Blood Culture adequate volume   Culture  Setup Time   Final    GRAM POSITIVE COCCI IN PAIRS AND CHAINS IN BOTH AEROBIC AND ANAEROBIC BOTTLES CRITICAL RESULT CALLED TO, READ BACK BY AND VERIFIED WITH: Truett Perna.D. 10:35 11/24/16 ([email or web address])    Culture STREPTOCOCCUS GALLOLYTICUS (A)  Final   Report Status 11/26/2016 FINAL  Final   Organism ID, Bacteria STREPTOCOCCUS GALLOLYTICUS  Final      Susceptibility   Streptococcus gallolyticus - MIC*    PENICILLIN 0.12 SENSITIVE Sensitive     CEFTRIAXONE <=0.12 SENSITIVE Sensitive     ERYTHROMYCIN INTERMEDIATE Intermediate     LEVOFLOXACIN 2 SENSITIVE Sensitive     VANCOMYCIN 0.5 SENSITIVE Sensitive     * STREPTOCOCCUS GALLOLYTICUS  Blood Culture ID Panel (Reflexed)     Status: Abnormal   Collection Time: 11/23/16  8:22 PM  Result Value Ref Range Status   Enterococcus species NOT DETECTED NOT DETECTED Final   Listeria monocytogenes NOT DETECTED NOT DETECTED Final   Staphylococcus species NOT DETECTED NOT DETECTED Final   Staphylococcus aureus NOT DETECTED NOT DETECTED Final   Streptococcus species DETECTED (A) NOT DETECTED Final    Comment: Not Enterococcus species, Streptococcus agalactiae, Streptococcus pyogenes, or Streptococcus pneumoniae. CRITICAL RESULT CALLED TO, READ BACK BY AND VERIFIED WITH: M. Radford Pax Pharm.D. 10:35 11/24/16 ([email or web address])    Streptococcus  agalactiae NOT DETECTED NOT DETECTED Final   Streptococcus pneumoniae NOT DETECTED NOT DETECTED Final   Streptococcus pyogenes NOT DETECTED NOT DETECTED Final   Acinetobacter baumannii NOT DETECTED NOT DETECTED Final   Enterobacteriaceae species NOT DETECTED NOT DETECTED Final   Enterobacter cloacae complex NOT DETECTED NOT DETECTED Final   Escherichia coli NOT  DETECTED NOT DETECTED Final   Klebsiella oxytoca NOT DETECTED NOT DETECTED Final   Klebsiella pneumoniae NOT DETECTED NOT DETECTED Final   Proteus species NOT DETECTED NOT DETECTED Final   Serratia marcescens NOT DETECTED NOT DETECTED Final   Haemophilus influenzae NOT DETECTED NOT DETECTED Final   Neisseria meningitidis NOT DETECTED NOT DETECTED Final   Pseudomonas aeruginosa NOT DETECTED NOT DETECTED Final   Candida albicans NOT DETECTED NOT DETECTED Final   Candida glabrata NOT DETECTED NOT DETECTED Final   Candida krusei NOT DETECTED NOT DETECTED Final   Candida parapsilosis NOT DETECTED NOT DETECTED Final   Candida tropicalis NOT DETECTED NOT DETECTED Final  MRSA PCR Screening     Status: None   Collection Time: 11/24/16  3:25 AM  Result Value Ref Range Status   MRSA by PCR NEGATIVE NEGATIVE Final    Comment:        The GeneXpert MRSA Assay (FDA approved for NASAL specimens only), is one component of a comprehensive MRSA colonization surveillance program. It is not intended to diagnose MRSA infection nor to guide or monitor treatment for MRSA infections.   Culture, blood (routine x 2)     Status: None   Collection Time: 11/25/16 10:18 AM  Result Value Ref Range Status   Specimen Description BLOOD RIGHT WRIST  Final   Special Requests   Final    BOTTLES DRAWN AEROBIC AND ANAEROBIC Blood Culture adequate volume   Culture NO GROWTH 5 DAYS  Final   Report Status 11/30/2016 FINAL  Final  Culture, blood (routine x 2)     Status: None   Collection Time: 11/25/16 10:18 AM  Result Value Ref Range Status   Specimen  Description BLOOD RIGHT HAND  Final   Special Requests   Final    BOTTLES DRAWN AEROBIC AND ANAEROBIC Blood Culture adequate volume   Culture NO GROWTH 5 DAYS  Final   Report Status 11/30/2016 FINAL  Final         Radiology Studies: No results found.      Scheduled Meds: . atorvastatin  20 mg Oral Daily  . bromocriptine  5 mg Oral QHS  . calcium acetate  667 mg Oral TID WC  . cinacalcet  30 mg Oral QPM  . diltiazem  30 mg Oral Q12H  . dorzolamide-timolol  1 drop Both Eyes BID  . [START ON 12/02/2016] heparin  2,800 Units Dialysis Once in dialysis  . loratadine  10 mg Oral Daily  . mouth rinse  15 mL Mouth Rinse BID  . metoprolol tartrate  50 mg Oral BID  . midodrine  15 mg Oral Q M,W,F-HD  . pantoprazole  40 mg Oral Daily  . polyethylene glycol  17 g Oral Daily  . predniSONE  5 mg Oral Daily  . sertraline  50 mg Oral Daily  . warfarin  5 mg Oral ONCE-1800  . Warfarin - Pharmacist Dosing Inpatient   Does not apply q1800   Continuous Infusions: . sodium chloride    . sodium chloride    . cefTRIAXone (ROCEPHIN)  IV Stopped (11/30/16 1915)  . gentamicin Stopped (11/29/16 2039)  . methocarbamol (ROBAXIN)  IV Stopped (11/27/16 1209)     LOS: 8 days    Time spent: 35 minutes.    Birdie Hopes, MD [facility] Pager [phone]  If 7PM-7AM, please contact night-coverage [email or web address] Password [email or web address] 12/01/2016, 12:11 PM

## 2016-12-01 NOTE — Interval H&P Note (Signed)
History and Physical Interval Note:  12/01/2016 9:03 AM  Alexander [name]  has presented today for surgery, with the diagnosis of bacteremia  The various methods of treatment have been discussed with the patient and family. After consideration of risks, benefits and other options for treatment, the patient has consented to  Procedure(s): TRANSESOPHAGEAL ECHOCARDIOGRAM (TEE) (N/A) as a surgical intervention .  The patient's history has been reviewed, patient examined, no change in status, stable for surgery.  I have reviewed the patient's chart and labs.  Questions were answered to the patient's satisfaction.     Oliverio Cho [employer]

## 2016-12-01 NOTE — Progress Notes (Signed)
INFECTIOUS DISEASE PROGRESS NOTE  ID: Alexander [name] is a 76 y.o. male with  Principal Problem:   Streptococcal bacteremia Active Problems:   Anemia, unspecified   Severe aortic stenosis   PAD (peripheral artery disease) (HCC)   ESRD (end stage renal disease) on dialysis (HCC)   Hyperlipidemia   Diabetic peripheral neuropathy (HCC)   S/P TAVR (transcatheter aortic valve replacement)   Atrial fibrillation with RVR (HCC)   Glaucoma   GERD (gastroesophageal reflux disease)   Hypomagnesemia   Type 2 diabetes mellitus (HCC)   Depression   SIRS (systemic inflammatory response syndrome) (HCC)   Presence of permanent cardiac pacemaker  Subjective: Garbled speech on awakening. Seen in HD TEE result noted.   Abtx:  Anti-infectives    Start     Dose/Rate Route Frequency Ordered Stop   11/26/16 1700  cefTRIAXone (ROCEPHIN) 2 g in dextrose 5 % 50 mL IVPB     2 g 100 mL/hr over 30 Minutes Intravenous Daily-1800 11/26/16 1639     11/26/16 1200  gentamicin (GARAMYCIN) 70 mg in dextrose 5 % 50 mL IVPB     70 mg 103.5 mL/hr over 30 Minutes Intravenous Every M-W-F (Hemodialysis) 11/24/16 1835     11/25/16 1800  meropenem (MERREM) 1 g in sodium chloride 0.9 % 100 mL IVPB  Status:  Discontinued     1 g 200 mL/hr over 30 Minutes Intravenous Every 24 hours 11/24/16 0832 11/24/16 1741   11/24/16 2200  gentamicin (GARAMYCIN) 150 mg in dextrose 5 % 50 mL IVPB     150 mg 107.5 mL/hr over 30 Minutes Intravenous  Once 11/24/16 1826 11/25/16 0335   11/24/16 2200  vancomycin (VANCOCIN) IVPB 1000 mg/200 mL premix  Status:  Discontinued     1,000 mg 200 mL/hr over 60 Minutes Intravenous Every M-W-F (Hemodialysis) 11/24/16 1828 11/26/16 1639   11/24/16 1000  meropenem (MERREM) 500 mg in sodium chloride 0.9 % 50 mL IVPB     500 mg 100 mL/hr over 30 Minutes Intravenous  Once 11/24/16 0832 11/24/16 1001   11/24/16 0130  meropenem (MERREM) 500 mg in sodium chloride 0.9 % 50 mL IVPB  Status:   Discontinued    Comments:  May discontinue cefepime once meropenem started.   500 mg 100 mL/hr over 30 Minutes Intravenous Every 24 hours 11/24/16 0115 11/24/16 0832   11/23/16 2030  ceFEPIme (MAXIPIME) 2 g in dextrose 5 % 50 mL IVPB     2 g 100 mL/hr over 30 Minutes Intravenous  Once 11/23/16 2024 11/24/16 0331   11/23/16 2030  vancomycin (VANCOCIN) 2,000 mg in sodium chloride 0.9 % 500 mL IVPB     2,000 mg 250 mL/hr over 120 Minutes Intravenous  Once 11/23/16 2024 11/24/16 0331   11/23/16 2000  levofloxacin (LEVAQUIN) IVPB 750 mg  Status:  Discontinued     750 mg 100 mL/hr over 90 Minutes Intravenous  Once 11/23/16 1959 11/23/16 2024   11/23/16 2000  aztreonam (AZACTAM) 2 g in dextrose 5 % 50 mL IVPB  Status:  Discontinued     2 g 100 mL/hr over 30 Minutes Intravenous  Once 11/23/16 1959 11/23/16 2024   11/23/16 2000  vancomycin (VANCOCIN) IVPB 1000 mg/200 mL premix  Status:  Discontinued     1,000 mg 200 mL/hr over 60 Minutes Intravenous  Once 11/23/16 1959 11/23/16 2024      Medications:  Scheduled: . atorvastatin  20 mg Oral Daily  . bromocriptine  5 mg Oral QHS  .  calcium acetate  667 mg Oral TID WC  . cinacalcet  30 mg Oral QPM  . diltiazem  30 mg Oral Q12H  . dorzolamide-timolol  1 drop Both Eyes BID  . [START ON 12/02/2016] heparin  2,800 Units Dialysis Once in dialysis  . loratadine  10 mg Oral Daily  . mouth rinse  15 mL Mouth Rinse BID  . metoprolol tartrate  50 mg Oral BID  . midodrine  15 mg Oral Q M,W,F-HD  . pantoprazole  40 mg Oral Daily  . polyethylene glycol  17 g Oral Daily  . predniSONE  5 mg Oral Daily  . sertraline  50 mg Oral Daily  . warfarin  5 mg Oral ONCE-1800  . Warfarin - Pharmacist Dosing Inpatient   Does not apply q1800    Objective: Vital signs in last 24 hours: Temp:  [97.5 F (36.4 C)-98.1 F (36.7 C)] 97.9 F (36.6 C) (09/05 1206) Pulse Rate:  [53-111] 103 (09/05 1415) Resp:  [12-27] 27 (09/05 1415) BP: (92-129)/(55-91) 107/77  (09/05 1415) SpO2:  [93 %-100 %] 96 % (09/05 1206)   General appearance: fatigued Resp: clear to auscultation bilaterally Cardio: regular rate and rhythm GI: normal findings: bowel sounds normal and soft, non-tender  Lab Results  Recent Labs  11/29/16 1648 12/01/16 1141  WBC 10.3  --   HGB 10.0*  --   HCT 30.5*  --   NA 133* 137  K 4.1 5.5*  CL 93* 97*  CO2 25 23  BUN 63* 53*  CREATININE 7.23* 6.86*   Liver Panel  Recent Labs  11/29/16 1648 12/01/16 1141  ALBUMIN 2.4* 2.4*   Sedimentation Rate No results for input(s): ESRSEDRATE in the last 72 hours. C-Reactive Protein No results for input(s): CRP in the last 72 hours.  Microbiology: Recent Results (from the past 240 hour(s))  Blood Culture (routine x 2)     Status: Abnormal   Collection Time: 11/23/16  8:15 PM  Result Value Ref Range Status   Specimen Description BLOOD RIGHT ANTECUBITAL  Final   Special Requests   Final    BOTTLES DRAWN AEROBIC AND ANAEROBIC Blood Culture adequate volume   Culture  Setup Time   Final    GRAM POSITIVE COCCI IN PAIRS AND CHAINS IN BOTH AEROBIC AND ANAEROBIC BOTTLES CRITICAL VALUE NOTED.  VALUE IS CONSISTENT WITH PREVIOUSLY REPORTED AND CALLED VALUE.    Culture (A)  Final    STREPTOCOCCUS GALLOLYTICUS SUSCEPTIBILITIES PERFORMED ON PREVIOUS CULTURE WITHIN THE LAST 5 DAYS.    Report Status 11/26/2016 FINAL  Final  Blood Culture (routine x 2)     Status: Abnormal   Collection Time: 11/23/16  8:22 PM  Result Value Ref Range Status   Specimen Description BLOOD RIGHT WRIST  Final   Special Requests   Final    BOTTLES DRAWN AEROBIC AND ANAEROBIC Blood Culture adequate volume   Culture  Setup Time   Final    GRAM POSITIVE COCCI IN PAIRS AND CHAINS IN BOTH AEROBIC AND ANAEROBIC BOTTLES CRITICAL RESULT CALLED TO, READ BACK BY AND VERIFIED WITH: Truett Perna.D. 10:35 11/24/16 ([email or web address])    Culture STREPTOCOCCUS GALLOLYTICUS (A)  Final   Report Status 11/26/2016 FINAL  Final    Organism ID, Bacteria STREPTOCOCCUS GALLOLYTICUS  Final      Susceptibility   Streptococcus gallolyticus - MIC*    PENICILLIN 0.12 SENSITIVE Sensitive     CEFTRIAXONE <=0.12 SENSITIVE Sensitive     ERYTHROMYCIN INTERMEDIATE Intermediate  LEVOFLOXACIN 2 SENSITIVE Sensitive     VANCOMYCIN 0.5 SENSITIVE Sensitive     * STREPTOCOCCUS GALLOLYTICUS  Blood Culture ID Panel (Reflexed)     Status: Abnormal   Collection Time: 11/23/16  8:22 PM  Result Value Ref Range Status   Enterococcus species NOT DETECTED NOT DETECTED Final   Listeria monocytogenes NOT DETECTED NOT DETECTED Final   Staphylococcus species NOT DETECTED NOT DETECTED Final   Staphylococcus aureus NOT DETECTED NOT DETECTED Final   Streptococcus species DETECTED (A) NOT DETECTED Final    Comment: Not Enterococcus species, Streptococcus agalactiae, Streptococcus pyogenes, or Streptococcus pneumoniae. CRITICAL RESULT CALLED TO, READ BACK BY AND VERIFIED WITH: M. Radford Pax Pharm.D. 10:35 11/24/16 ([email or web address])    Streptococcus agalactiae NOT DETECTED NOT DETECTED Final   Streptococcus pneumoniae NOT DETECTED NOT DETECTED Final   Streptococcus pyogenes NOT DETECTED NOT DETECTED Final   Acinetobacter baumannii NOT DETECTED NOT DETECTED Final   Enterobacteriaceae species NOT DETECTED NOT DETECTED Final   Enterobacter cloacae complex NOT DETECTED NOT DETECTED Final   Escherichia coli NOT DETECTED NOT DETECTED Final   Klebsiella oxytoca NOT DETECTED NOT DETECTED Final   Klebsiella pneumoniae NOT DETECTED NOT DETECTED Final   Proteus species NOT DETECTED NOT DETECTED Final   Serratia marcescens NOT DETECTED NOT DETECTED Final   Haemophilus influenzae NOT DETECTED NOT DETECTED Final   Neisseria meningitidis NOT DETECTED NOT DETECTED Final   Pseudomonas aeruginosa NOT DETECTED NOT DETECTED Final   Candida albicans NOT DETECTED NOT DETECTED Final   Candida glabrata NOT DETECTED NOT DETECTED Final   Candida krusei NOT DETECTED NOT DETECTED  Final   Candida parapsilosis NOT DETECTED NOT DETECTED Final   Candida tropicalis NOT DETECTED NOT DETECTED Final  MRSA PCR Screening     Status: None   Collection Time: 11/24/16  3:25 AM  Result Value Ref Range Status   MRSA by PCR NEGATIVE NEGATIVE Final    Comment:        The GeneXpert MRSA Assay (FDA approved for NASAL specimens only), is one component of a comprehensive MRSA colonization surveillance program. It is not intended to diagnose MRSA infection nor to guide or monitor treatment for MRSA infections.   Culture, blood (routine x 2)     Status: None   Collection Time: 11/25/16 10:18 AM  Result Value Ref Range Status   Specimen Description BLOOD RIGHT WRIST  Final   Special Requests   Final    BOTTLES DRAWN AEROBIC AND ANAEROBIC Blood Culture adequate volume   Culture NO GROWTH 5 DAYS  Final   Report Status 11/30/2016 FINAL  Final  Culture, blood (routine x 2)     Status: None   Collection Time: 11/25/16 10:18 AM  Result Value Ref Range Status   Specimen Description BLOOD RIGHT HAND  Final   Special Requests   Final    BOTTLES DRAWN AEROBIC AND ANAEROBIC Blood Culture adequate volume   Culture NO GROWTH 5 DAYS  Final   Report Status 11/30/2016 FINAL  Final    Studies/Results: No results found.   Assessment/Plan: Strep gallolyticus bacteremia (8-28)             Repeat BCx 8-30 ngtd             TAVR 08-2016             Pacer ESRD  Will stop gent Would change to ceftriaxone alone.  Review of guidelines does not support use of anbx for this type of strep outside of PEN (  allergy listed), amox or ceftriaxone.  He needs 7 more days of ceftriaxone (2 weeks from last negative BCx)  Needs repeat BCx 1 week off antibiotics.  HD MWF  Will write for OPAT consult.  Available as needed.   Total days of antibiotics: 9 (ceftriaxone/gent)         Bobby Rumpf Infectious Diseases (pager) [phone] [email or web address] 12/01/2016, 3:18 PM  LOS: 8 days

## 2016-12-02 DIAGNOSIS — N2581 Secondary hyperparathyroidism of renal origin: Secondary | ICD-10-CM | POA: Diagnosis not present

## 2016-12-02 DIAGNOSIS — Z992 Dependence on renal dialysis: Secondary | ICD-10-CM | POA: Diagnosis not present

## 2016-12-02 DIAGNOSIS — Z23 Encounter for immunization: Secondary | ICD-10-CM | POA: Diagnosis not present

## 2016-12-02 DIAGNOSIS — I482 Chronic atrial fibrillation: Secondary | ICD-10-CM | POA: Diagnosis not present

## 2016-12-02 DIAGNOSIS — I6930 Unspecified sequelae of cerebral infarction: Secondary | ICD-10-CM | POA: Diagnosis not present

## 2016-12-02 DIAGNOSIS — Z952 Presence of prosthetic heart valve: Secondary | ICD-10-CM | POA: Diagnosis not present

## 2016-12-02 DIAGNOSIS — I4891 Unspecified atrial fibrillation: Secondary | ICD-10-CM | POA: Diagnosis not present

## 2016-12-02 DIAGNOSIS — G609 Hereditary and idiopathic neuropathy, unspecified: Secondary | ICD-10-CM | POA: Diagnosis not present

## 2016-12-02 DIAGNOSIS — I481 Persistent atrial fibrillation: Secondary | ICD-10-CM | POA: Diagnosis not present

## 2016-12-02 DIAGNOSIS — R7881 Bacteremia: Secondary | ICD-10-CM | POA: Diagnosis not present

## 2016-12-02 DIAGNOSIS — J849 Interstitial pulmonary disease, unspecified: Secondary | ICD-10-CM | POA: Diagnosis not present

## 2016-12-02 DIAGNOSIS — E1122 Type 2 diabetes mellitus with diabetic chronic kidney disease: Secondary | ICD-10-CM | POA: Diagnosis not present

## 2016-12-02 DIAGNOSIS — B955 Unspecified streptococcus as the cause of diseases classified elsewhere: Secondary | ICD-10-CM | POA: Diagnosis not present

## 2016-12-02 DIAGNOSIS — H3530 Unspecified macular degeneration: Secondary | ICD-10-CM | POA: Diagnosis not present

## 2016-12-02 DIAGNOSIS — R5381 Other malaise: Secondary | ICD-10-CM | POA: Diagnosis not present

## 2016-12-02 DIAGNOSIS — E1129 Type 2 diabetes mellitus with other diabetic kidney complication: Secondary | ICD-10-CM | POA: Diagnosis not present

## 2016-12-02 DIAGNOSIS — R2681 Unsteadiness on feet: Secondary | ICD-10-CM | POA: Diagnosis not present

## 2016-12-02 DIAGNOSIS — F3340 Major depressive disorder, recurrent, in remission, unspecified: Secondary | ICD-10-CM | POA: Diagnosis not present

## 2016-12-02 DIAGNOSIS — E7800 Pure hypercholesterolemia, unspecified: Secondary | ICD-10-CM | POA: Diagnosis not present

## 2016-12-02 DIAGNOSIS — R41841 Cognitive communication deficit: Secondary | ICD-10-CM | POA: Diagnosis not present

## 2016-12-02 DIAGNOSIS — I509 Heart failure, unspecified: Secondary | ICD-10-CM | POA: Diagnosis not present

## 2016-12-02 DIAGNOSIS — M545 Low back pain: Secondary | ICD-10-CM | POA: Diagnosis not present

## 2016-12-02 DIAGNOSIS — I9589 Other hypotension: Secondary | ICD-10-CM | POA: Diagnosis not present

## 2016-12-02 DIAGNOSIS — I120 Hypertensive chronic kidney disease with stage 5 chronic kidney disease or end stage renal disease: Secondary | ICD-10-CM | POA: Diagnosis not present

## 2016-12-02 DIAGNOSIS — M6281 Muscle weakness (generalized): Secondary | ICD-10-CM | POA: Diagnosis not present

## 2016-12-02 DIAGNOSIS — R1010 Upper abdominal pain, unspecified: Secondary | ICD-10-CM | POA: Diagnosis not present

## 2016-12-02 DIAGNOSIS — H47619 Cortical blindness, unspecified side of brain: Secondary | ICD-10-CM | POA: Diagnosis not present

## 2016-12-02 DIAGNOSIS — N186 End stage renal disease: Secondary | ICD-10-CM | POA: Diagnosis not present

## 2016-12-02 DIAGNOSIS — D631 Anemia in chronic kidney disease: Secondary | ICD-10-CM | POA: Diagnosis not present

## 2016-12-02 DIAGNOSIS — A419 Sepsis, unspecified organism: Secondary | ICD-10-CM | POA: Diagnosis not present

## 2016-12-02 DIAGNOSIS — F329 Major depressive disorder, single episode, unspecified: Secondary | ICD-10-CM | POA: Diagnosis not present

## 2016-12-02 LAB — GLUCOSE, CAPILLARY
GLUCOSE-CAPILLARY: 75 mg/dL (ref 65–99)
GLUCOSE-CAPILLARY: 103 mg/dL — AB (ref 65–99)
GLUCOSE-CAPILLARY: 121 mg/dL — AB (ref 65–99)

## 2016-12-02 LAB — PROTIME-INR
INR: 1.97
Prothrombin Time: 22.2 seconds — ABNORMAL HIGH (ref 11.4–15.2)

## 2016-12-02 MED ORDER — DILTIAZEM HCL 30 MG PO TABS: 30.0000 mg | ORAL_TABLET | Freq: Two times a day (BID) | ORAL | 0 refills | Status: DC

## 2016-12-02 MED ORDER — SENNOSIDES-DOCUSATE SODIUM 8.6-50 MG PO TABS: 1.0000 | ORAL_TABLET | Freq: Every day | ORAL | 0 refills | Status: DC

## 2016-12-02 MED ORDER — METHOCARBAMOL 500 MG PO TABS
500.0000 mg | ORAL_TABLET | Freq: Four times a day (QID) | ORAL | 0 refills | Status: DC | PRN
Start: 2016-12-02 — End: 2016-12-02

## 2016-12-02 MED ORDER — METOPROLOL TARTRATE 50 MG PO TABS: 50.0000 mg | ORAL_TABLET | Freq: Two times a day (BID) | ORAL | 0 refills | Status: DC

## 2016-12-02 MED ORDER — CEFAZOLIN SODIUM-DEXTROSE 2-4 GM/100ML-% IV SOLN: INTRAVENOUS | 0 refills | Status: DC

## 2016-12-02 MED ORDER — METHOCARBAMOL 500 MG PO TABS: 500.0000 mg | ORAL_TABLET | Freq: Four times a day (QID) | ORAL | 0 refills | Status: DC | PRN

## 2016-12-02 MED ORDER — POLYETHYLENE GLYCOL 3350 17 G PO PACK: 17.0000 g | PACK | Freq: Every day | ORAL | 0 refills | Status: DC

## 2016-12-02 MED ORDER — OXYCODONE-ACETAMINOPHEN 5-325 MG PO TABS: 1.0000 | ORAL_TABLET | Freq: Three times a day (TID) | ORAL | 0 refills | Status: DC | PRN

## 2016-12-02 MED ORDER — WARFARIN SODIUM 5 MG PO TABS
5.0000 mg | ORAL_TABLET | Freq: Every day | ORAL | Status: DC
  Administered 2016-12-02: 5 mg via ORAL
  Filled 2016-12-02: qty 1

## 2016-12-02 NOTE — Discharge Summary (Signed)
Discharge Summary  BRODERICK FONSECA ZOX:[id number] DOB: 07/18/1940  PCP: Deland Pretty, MD  Admit date: 11/23/2016 Discharge date: 12/02/2016  Time spent: >69mins, more than 50% time spent on coordination of care  Recommendations for Outpatient Follow-up:  1. F/u with SNF MD  for hospital discharge follow up, repeat cbc/bmp at follow up. SNF MD to monitor INR level 2. Continue dialysis MWF, finish abx during dialysis for bacteremia 3. F/u with EP cardiology Dr Curt Bears on 9/10 4. F/u with cardiology Dr Oval Linsey on 9/18  Discharge Diagnoses:  Active Hospital Problems   Diagnosis Date Noted  . Streptococcal bacteremia 11/24/2016  . Presence of permanent cardiac pacemaker 11/24/2016  . Atrial fibrillation with RVR ([place]) 11/23/2016  . Glaucoma 11/23/2016  . GERD (gastroesophageal reflux disease) 11/23/2016  . Hypomagnesemia 11/23/2016  . Type 2 diabetes mellitus ([place]) 11/23/2016  . Depression 11/23/2016  . SIRS (systemic inflammatory response syndrome) ([place]) 11/23/2016  . S/P TAVR (transcatheter aortic valve replacement) 08/31/2016  . Diabetic peripheral neuropathy ([place]) 10/02/2015  . Hyperlipidemia 04/04/2014  . ESRD (end stage renal disease) on dialysis ([place]) 03/13/2013  . PAD (peripheral artery disease) ([place]) 12/12/2012  . Severe aortic stenosis 06/15/2012  . Anemia, unspecified 02/03/2011    Resolved Hospital Problems   Diagnosis Date Noted Date Resolved  No resolved problems to display.    Discharge Condition: stable  Diet recommendation: heart healthy/renal diet  Filed Weights   11/29/16 1907 12/01/16 1415 12/01/16 1822  Weight: 88.6 kg (195 lb 5.2 oz) 91 kg (200 lb 9.9 oz) 89 kg (196 lb 3.4 oz)    History of present illness: (per admitting MD Dr Olevia Bowens) PCP: Deland Pretty, MD   Patient coming from: home.  I have personally briefly reviewed patient's old medical records in [place]  Chief Complaint: Back and chest pain.  HPI: SUREN PAYNE is a 76  y.o. male with medical history significant of anemia, aortic stenosis, PUD, GERD, Barrett's esophagus, carpal tunnel syndrome, paroxysmal atrial fibrillation, history of bradycardia (S/Pdual lead pacemaker placement), CVA, depression, type 2 diabetes, diabetic peripheral neuropathy, diverticulosis, ESRD on hemodialysis, glaucoma, hyperlipidemia, hypertension, nephrolithiasis, macular degeneration/legally blind, seasonal allergies, tubular adenoma of the colon who is coming to the emergency department due to progressively worse back pain, chest pain, left shoulder pain, muscle spasms of all extremities since yesterday after he finished hemodialysis. He had chills and fatigue yesterday, but no fever. No earache, rhinorrhea, sore throat, but positive productive cough for several days. No abdominal pain, but positive nausea, no emesis, no diarrhea, no constipation, no dysuria, no frequency. History is mainly provided by his daughter [name].  ED Course: Initial vital signs temperature 98.7, pulse ox 109, blood pressure 104/67 mmHg, respirations 15 and O2 sat 97% on room air. While in the ER, the patient developed a headache and a 103.59F fever. He subsequently had his A. fib go into RVR. He was started on a Cardizem infusion and IV antibiotics. He was given cefepime and vancomycin. No source of infection was obvious and LP was considered by Dr. Ellender Hose, but was deferred due to elevated INR. His WBC was 13.5 without differential available, hemoglobin 11.1 g/dL and platelets 123. PTT 31.5 seconds and INR 3.08. CMP showed BUN of 40, creatinine of 7.34 and glucose of 117 mg/dL. Albumin was 3.0 g/dL and AST mildly elevated at 55. All other values of CMP were unremarkable. Troponin level was 0.07 and a follow-up level was 0.09 ng/mL.  Imaging: Chest radiograph, CT chest, CT head and  CT neck did not show any significant acute pathology that would explain his symptoms, but they had numerous chronic findings. Please see  images sent for audiology reports for further detail.  Hospital Course:  Principal Problem:   Streptococcal bacteremia Active Problems:   Anemia, unspecified   Severe aortic stenosis   PAD (peripheral artery disease) ([facility])   ESRD (end stage renal disease) on dialysis ([facility])   Hyperlipidemia   Diabetic peripheral neuropathy ([facility])   S/P TAVR (transcatheter aortic valve replacement)   Atrial fibrillation with RVR ([facility])   Glaucoma   GERD (gastroesophageal reflux disease)   Hypomagnesemia   Type 2 diabetes mellitus (HCC)   Depression   SIRS (systemic inflammatory response syndrome) (HCC)   Presence of permanent cardiac pacemaker  Sepsis presented on admission with streptococcus bacteremia  -Unclear source (HD patient), patient had CT scan of the C-spine showed no evidence of discitis/abscess. -TEE was done on 9/5 and showed no evidence of vegetation. -he is treated initially by meropenem and vancomycin, changed to Rocephin and gentamicin abx at discharge per infectious disease recommendation:  Indication: Streptococcal bacteremia Regimen: Cefazolin 2 grams iv MWF with HD End date: 12/09/16 Easier dosing with hemodialysis, avoiding PICC placement for Ceftriaxone therapy Needs repeat BCx 1 week off antibiotics.   Afib with RVR, pt has a h/o PAF,: h/o bradycardia s/p pacemaker  -RVR on admission likely secondary to hypotension and sepsis, metoprolol increased to 50 mg and Cardizem added. -Currently rate is controlled. -CHA2DS2-VASc is greater than 3, continue Coumadin (goal 2-3). INR 1.97 at discharge.   Elevated troponin -Slightly elevated troponin, 0.09 was a peak, showed flat trend. -The curve is not consistent with acute coronary syndrome, this is likely poor secretion secondary to ESRD. -he does not have chest pain He is to continue follow with cardiology  H/o severe AS, s/p  Bioprosthetic AVR in 08/2016:  -History of TAVR, for severe AS, TEE on 9/5 no acute changes, no  vegetation.  continue follow with cardiology  ESRD -Dialysis on Monday, Wednesday and Friday.  HYPOTENSION; on midodrine chronically prior to dialysis as needed Asymptomatic. bp stable at discharge  Anemia of chronic disease:  Hemoglobin around 10. Monitor.   Elevated blood sugar, possibly steroids induced, He has been on chronic prednisone for arthritis for the last year), ?TYPE 2 DM:  He is not on meds for this, a1c 5.7 in 08/2016 Continue montior.    GERD:  Resume PPI.    Right hip pain: CT of the right hip ordered by ID and is negative for acute pathology.  - pain control with IV toradol.   Muscle spasm: prn robaxin     DVT prophylaxis while in the hospital: on coumadin chronically Code Status: (Full code) Family Communication: patient and daughter  at bedside.  Disposition Plan: SNF   Consultants:   Nephrology  ID  PCCM over the phone by Dr Rae Lips.   Procedures: HD TEE on 9/5  Antimicrobials: MEROPENAM till 8/29.  Gentamicin from 8/30 vancomycin from admission till 8/31 Rocephin from 8/31. [name]    Discharge Exam: BP (!) 105/59 (BP Location: Right Arm)   Pulse 92   Temp 97.8 F (36.6 C) (Oral)   Resp 12   Ht 5' 10.5" (1.791 m)   Wt 89 kg (196 lb 3.4 oz)   SpO2 100%   BMI 27.76 kg/m   General: Frail, chronically ill, legally blind ( only see shadows), oriented to place and person, not to time Cardiovascular: IRRR Respiratory: diminished, no  wheezing, no rales, no rhonchi Extremity: no edema  Discharge Instructions You were cared for by a hospitalist during your hospital stay. If you have any questions about your discharge medications or the care you received while you were in the hospital after you are discharged, you can call the unit and asked to speak with the hospitalist on call if the hospitalist that took care of you is not available. Once you are discharged, your primary care physician will handle any further medical issues. Please  note that NO REFILLS for any discharge medications will be authorized once you are discharged, as it is imperative that you return to your primary care physician (or establish a relationship with a primary care physician if you do not have one) for your aftercare needs so that they can reassess your need for medications and monitor your lab values.  Discharge Instructions    Diet - low sodium heart healthy    Complete by:  As directed    Renal diet   Increase activity slowly    Complete by:  As directed      Allergies as of 12/02/2016      Reactions   Penicillins Swelling   SWELLING REACTION UNSPECIFIED  PATIENT HAD A PCN REACTION WITH IMMEDIATE RASH, FACIAL/TONGUE/THROAT SWELLING, SOB, OR LIGHTHEADEDNESS WITH HYPOTENSION:  #  #  #  YES  #  #  #   Has patient had a PCN reaction causing severe rash involving mucus membranes or skin necrosis: No Has patient had a PCN reaction that required hospitalization No Has patient had a PCN reaction occurring within the last 10 years: No If all of the above answers are "NO", then may proceed with Cephalosporin use.   Codeine Nausea Only   Tramadol Nausea Only      Medication List    TAKE these medications   acetaminophen 325 MG tablet Commonly known as:  TYLENOL Take 2 tablets (650 mg total) by mouth every 6 (six) hours as needed for mild pain.   atorvastatin 40 MG tablet Commonly known as:  LIPITOR Take 20 mg by mouth daily.   b complex-vitamin c-folic acid 0.8 MG Tabs tablet Take 1 tablet by mouth daily.   bromocriptine 5 MG capsule Commonly known as:  PARLODEL Take 5 mg by mouth at bedtime.   calcium acetate 667 MG capsule Commonly known as:  PHOSLO Take 667 mg by mouth 4 (four) times daily.   ceFAZolin 2-4 GM/100ML-% IVPB Commonly known as:  ANCEF Indication: Streptococcal bacteremia Regimen: Cefazolin 2 grams iv MWF with HD End date: 12/09/16  Easier dosing with hemodialysis, avoiding PICC placement for Ceftriaxone therapy     cetirizine 10 MG tablet Commonly known as:  ZYRTEC Take 10 mg by mouth at bedtime.   cinacalcet 30 MG tablet Commonly known as:  SENSIPAR Take 30 mg by mouth every evening.   diltiazem 30 MG tablet Commonly known as:  CARDIZEM Take 1 tablet (30 mg total) by mouth every 12 (twelve) hours.   dorzolamide-timolol 22.3-6.8 MG/ML ophthalmic solution Commonly known as:  COSOPT Place 1 drop into the right eye 2 (two) times daily.   methocarbamol 500 MG tablet Commonly known as:  ROBAXIN Take 1 tablet (500 mg total) by mouth every 6 (six) hours as needed for muscle spasms.   metoprolol tartrate 50 MG tablet Commonly known as:  LOPRESSOR Take 1 tablet (50 mg total) by mouth 2 (two) times daily. What changed:  medication strength  how much to take  midodrine 5 MG tablet Commonly known as:  PROAMATINE Take 15mg  (3 tablets) three times weekly as needed for low blood pressure after dialysis.   omeprazole 20 MG capsule Commonly known as:  PRILOSEC Take 20 mg by mouth daily.   oxyCODONE-acetaminophen 5-325 MG tablet Commonly known as:  ROXICET Take 1 tablet by mouth every 8 (eight) hours as needed for severe pain.   polyethylene glycol packet Commonly known as:  MIRALAX / GLYCOLAX Take 17 g by mouth daily.   predniSONE 5 MG tablet Commonly known as:  DELTASONE Take 5 mg by mouth daily.   senna-docusate 8.6-50 MG tablet Commonly known as:  Senokot-S Take 1 tablet by mouth at bedtime.   sertraline 50 MG tablet Commonly known as:  ZOLOFT Take 50 mg by mouth daily.   warfarin 5 MG tablet Commonly known as:  COUMADIN Take 1-1.5 tablets by mouth daily as directed by coumadin clinic What changed:  how much to take  how to take this  when to take this  additional instructions            Discharge Care Instructions        Start     Ordered   12/02/16 0000  metoprolol tartrate (LOPRESSOR) 50 MG tablet  2 times daily     12/02/16 0948   12/02/16 0000   polyethylene glycol (MIRALAX / GLYCOLAX) packet  Daily     12/02/16 0948   12/02/16 0000  senna-docusate (SENOKOT-S) 8.6-50 MG tablet  Daily at bedtime     12/02/16 0948   12/02/16 0000  Increase activity slowly     12/02/16 0948   12/02/16 0000  Diet - low sodium heart healthy     12/02/16 0948   12/02/16 0000  oxyCODONE-acetaminophen (ROXICET) 5-325 MG tablet  Every 8 hours PRN     12/02/16 0951   12/02/16 0000  methocarbamol (ROBAXIN) 500 MG tablet  Every 6 hours PRN     12/02/16 0951   12/02/16 0000  ceFAZolin (ANCEF) 2-4 GM/100ML-% IVPB     12/02/16 0956   12/02/16 0000  diltiazem (CARDIZEM) 30 MG tablet  Every 12 hours     12/02/16 0957     Allergies  Allergen Reactions  . Penicillins Swelling    SWELLING REACTION UNSPECIFIED   PATIENT HAD A PCN REACTION WITH IMMEDIATE RASH, FACIAL/TONGUE/THROAT SWELLING, SOB, OR LIGHTHEADEDNESS WITH HYPOTENSION:  #  #  #  YES  #  #  #   Has patient had a PCN reaction causing severe rash involving mucus membranes or skin necrosis: No Has patient had a PCN reaction that required hospitalization No Has patient had a PCN reaction occurring within the last 10 years: No If all of the above answers are "NO", then may proceed with Cephalosporin use.  . Codeine Nausea Only  . Tramadol Nausea Only    Contact information for follow-up providers    dialysis MWF Follow up.            Contact information for after-discharge care    Destination    [facility] Follow up.   Specialty:  Skilled Nursing Facility Contact information: [place] [phone]                   The results of significant diagnostics from this hospitalization (including imaging, microbiology, ancillary and laboratory) are listed below for reference.    Significant Diagnostic Studies: Dg Chest 2 View  Result Date: 11/23/2016  CLINICAL DATA:  76 year old male with chest pain, shortness of breath on exertion and  painful back spasms for 2 days. EXAM: CHEST  2 VIEW COMPARISON:  11/11/2016 and earlier. FINDINGS: Upright AP and lateral views of the chest. Stable right chest dual lead cardiac pacemaker. Prior TAVR. Stable cardiac size and mediastinal contours. Lower lung volumes on the AP view. No pneumothorax, pleural effusion or confluent pulmonary opacity. Mild increased pulmonary vascularity but no overt edema. No acute osseous abnormality identified. Calcified aortic atherosclerosis. Negative visible bowel gas pattern. IMPRESSION: 1. Increased pulmonary vascular congestion without overt pulmonary edema. 2. No other No acute cardiopulmonary abnormality. 3.  Aortic Atherosclerosis (ICD10-I70.0). Electronically Signed   By: Genevie Ann M.D.   On: 11/23/2016 16:11   Dg Chest 2 View  Result Date: 11/11/2016 CLINICAL DATA:  Shortness of breath for 1 month, former smoking history EXAM: CHEST  2 VIEW COMPARISON:  Chest x-ray of 09/21/2016 FINDINGS: Mild cardiomegaly is stable and permanent pacemaker leads remain. There may be tiny pleural effusions blunting the costophrenic angles. However no pneumonia is seen. Aortic valve replacement is present. No bony abnormality is seen. IMPRESSION: 1. Mild cardiomegaly and probable tiny pleural effusions. Consider mild CHF. 2. Permanent pacemaker leads. Electronically Signed   By: Ivar Drape M.D.   On: 11/11/2016 15:45   Ct Head Wo Contrast  Result Date: 11/26/2016 CLINICAL DATA:  Initial evaluation for acute severe headache. Supratherapeutic I NR. EXAM: CT HEAD WITHOUT CONTRAST TECHNIQUE: Contiguous axial images were obtained from the base of the skull through the vertex without intravenous contrast. COMPARISON:  Prior CT from 11/23/2016. FINDINGS: Brain: Generalized age related cerebral atrophy with chronic small vessel ischemic disease. Large remote left PCA territory infarct, stable. No acute intracranial hemorrhage. No evidence for acute large vessel territory infarct. No mass  lesion, midline shift or mass effect. No hydrocephalus. No extra-axial fluid collection. Vascular: No hyperdense vessel. Scattered vascular calcifications noted within the carotid siphons. Skull: Scalp soft tissues and calvarium within normal limits. Sinuses/Orbits: Globes and orbital soft tissues within normal limits. Postsurgical changes noted at the right globe. Paranasal sinuses and mastoid air cells are clear. Other: None. IMPRESSION: 1. No acute intracranial process. 2. Stable atrophy with chronic small vessel ischemic disease and remote left PCA territory infarct. Electronically Signed   By: Jeannine Boga M.D.   On: 11/26/2016 20:28   Ct Head Wo Contrast  Result Date: 11/23/2016 CLINICAL DATA:  Headache and intermittent chest pain. EXAM: CT HEAD WITHOUT CONTRAST CT CERVICAL SPINE WITHOUT CONTRAST TECHNIQUE: Multidetector CT imaging of the head and cervical spine was performed following the standard protocol without intravenous contrast. Multiplanar CT image reconstructions of the cervical spine were also generated. COMPARISON:  09/27/2016 head CT and 10/05/2015 CT cervical spine FINDINGS: CT HEAD FINDINGS Brain: Chronic left parietooccipital and inferior left temporal lobe infarcts with encephalomalacia. No acute intracranial hemorrhage, hydrocephalus, intra-axial mass nor extra-axial collections. No new large vascular territory infarction. Patchy small vessel ischemic disease of periventricular white matter likely reflects chronic small vessel ischemia. Vascular: No hyperdense vessel or unexpected calcification. Skull: Negative for fracture or focal lesion. Sinuses/Orbits: Ocular surgeries are noted bilaterally with scleral band on the right and lens replacement bilaterally. Other: None CT CERVICAL SPINE FINDINGS Alignment: Chronic anterolisthesis of C3 on C4, grade 1 up to 2.4 mm. Slight reversal cervical lordosis is again noted with degenerative disc disease from C4 through C7. Skull base and  vertebrae: Negative for acute fracture. Osteoarthritis of the atlantooccipital and atlantodental intervals.  Soft tissues and spinal canal: No prevertebral fluid or swelling. No visible canal hematoma. Disc levels: Mild disc space narrowing C2-3 and C3-4 with marked disc space narrowing C4 through C7. Small posterior marginal osteophytes and uncovertebral joint spurring is noted from C4 through C7. Multilevel degenerative facet arthropathy is seen as before. Upper chest: No acute pulmonary abnormality. Other: Moderate bilateral extracranial carotid arteriosclerosis. IMPRESSION: 1. Chronic left parieto-occipital and inferior left temporal lobe infarcts with encephalomalacia. 2. Chronic small vessel ischemic disease of periventricular white matter. No acute intracranial abnormality. 3. Cervical spondylosis, chronic in appearance without acute osseous abnormality. 4. Dense bilateral extracranial carotid arteriosclerosis, previously worked up with [place] in 2016. Electronically Signed   By: Ashley Royalty M.D.   On: 11/23/2016 22:45   Ct Chest Wo Contrast  Result Date: 11/23/2016 CLINICAL DATA:  Intermittent chest pain EXAM: CT CHEST WITHOUT CONTRAST TECHNIQUE: Multidetector CT imaging of the chest was performed following the standard protocol without IV contrast. COMPARISON:  Radiograph 11/23/2016, CT 08/24/2016 FINDINGS: Cardiovascular: Limited evaluation without intravenous contrast. Ectasia of the ascending segment, measuring up to 3.8 cm. Atherosclerotic vascular calcification. Interval aortic valve replacement. Coronary artery calcifications. Intracardiac pacing leads. No pericardial effusion. Mild cardiomegaly. Mediastinum/Nodes: Stable mildly prominent mediastinal lymph nodes with fatty hilus. Midline trachea. 8 mm hypodense nodule left lobe of thyroid. Esophagus within normal limits. Lungs/Pleura: Mild apical emphysema. No consolidation, pleural effusion or pneumothorax. Upper Abdomen: Partially visualized  atrophic kidneys. Musculoskeletal: No acute or suspicious bone lesion. IMPRESSION: 1. Minimal apical emphysema. No acute pulmonary infiltrate, pleural effusion or pneumothorax 2. Interval aortic valve replacement.  Cardiomegaly. 3. Stable mildly prominent mediastinal lymph nodes. 4. Atrophic kidneys Aortic Atherosclerosis (ICD10-I70.0) and Emphysema (ICD10-J43.9). Electronically Signed   By: Donavan Foil M.D.   On: 11/23/2016 22:48   Ct Cervical Spine Wo Contrast  Result Date: 11/23/2016 CLINICAL DATA:  Neck pain EXAM: CT CERVICAL SPINE WITHOUT CONTRAST TECHNIQUE: Multidetector CT imaging of the cervical spine was performed without intravenous contrast. Multiplanar CT image reconstructions were also generated. COMPARISON:  10/05/2015 CT FINDINGS: This report was also included as part of the same day head CT report but could not be merged. The contents of the report and impression on the same. Alignment: Chronic anterolisthesis of C3 on C4, grade 1 up to 2.4 mm. Slight reversal cervical lordosis is again noted with degenerative disc disease from C4 through C7. Skull base and vertebrae: Negative for acute fracture. Osteoarthritis of the atlantooccipital and atlantodental intervals. Soft tissues and spinal canal: No prevertebral fluid or swelling. No visible canal hematoma. Disc levels: Mild disc space narrowing C2-3 and C3-4 with marked disc space narrowing C4 through C7. Small posterior marginal osteophytes and uncovertebral joint spurring is noted from C4 through C7. Multilevel degenerative facet arthropathy is seen as Before. Upper chest: No acute pulmonary abnormality. Other: Moderate bilateral extracranial carotid arteriosclerosis. IMPRESSION: 1. Cervical spondylosis, chronic in appearance without acute osseous abnormality. 2. Dense bilateral extracranial carotid arteriosclerosis, previously worked up with [place] in 2016. Electronically Signed   By: Ashley Royalty M.D.   On: 11/23/2016 22:52   Ct Cervical Spine  W Contrast  Result Date: 11/26/2016 CLINICAL DATA:  Neck pain ,infection suspected EXAM: CT CERVICAL SPINE WITH CONTRAST TECHNIQUE: Multidetector CT imaging of the cervical spine was performed during intravenous contrast administration. Multiplanar CT image reconstructions were also generated. CONTRAST:  2mL ISOVUE-300 IOPAMIDOL (ISOVUE-300) INJECTION 61%, Cleared by for dr Jonnie Finner COMPARISON:  11/23/2016 FINDINGS: Alignment: Chronic anterolisthesis of C3 on C4, up to 40mm.  Reversal of the normal cervical lordosis is again noted. Skull base and vertebrae: Negative for acute fracture. Osteoarthritis of the atlantooccipital articulation. Partially calcified pannus around the atlantoaxial articulation. Soft tissues and spinal canal: No prevertebral fluid or swelling. No visible canal hematoma.  No areas of unexpected enhancement. Disc levels: Mild disc space narrowing C2-3 and C3-4 with marked disc space narrowing C4 through C7. Small posterior marginal osteophytes and uncovertebral joint spurring is noted from C4 through C7. Multilevel degenerative facet arthropathy is seen as Before, left worse than right. Upper chest: Right subclavian transvenous pacemaker, incompletely visualized. Bilateral pleural effusions, new since prior study. Mediastinal lymph nodes measured up to 12 mm short axis diameter tunneled possibly reactive but nonspecific. Other: Moderate bilateral extracranial carotid arteriosclerosis. High-grade occlusive disease at the left carotid bifurcation extending through the ICA. Heavily calcified plaque in the proximal right ICA resulting in at least moderate short segment stenosis. Encephalomalacia in the left parieto-occipital and inferior left temporal lobe as before. IMPRESSION: 1. Negative for abscess or other unexpected enhancement. 2. Stable multilevel cervical spondylitic changes. 3. New bilateral pleural effusions . 4. Extensive bilateral extracranial carotid arterial disease. Electronically  Signed   By: Lucrezia Europe M.D.   On: 11/26/2016 08:35   Ct Hip Right Wo Contrast  Result Date: 11/27/2016 CLINICAL DATA:  Bacteremia. End-stage renal disease. Right hip pain. EXAM: CT OF THE RIGHT HIP WITHOUT CONTRAST TECHNIQUE: Multidetector CT imaging of the right hip was performed according to the standard protocol. Multiplanar CT image reconstructions were also generated. COMPARISON:  CT pelvis from 08/24/2016 FINDINGS: Bones/Joint/Cartilage No fracture or acute bony findings. Mild chondrocalcinosis along the femoral head articular cartilage. Ligaments Suboptimally assessed by CT. Muscles and Tendons No abnormality or change from 08/24/2016. Soft tissues Sigmoid diverticulosis. Atherosclerotic calcification of regional arterials structures. No regional bursitis. No pathologic adenopathy at the right groin. No impingement at the sciatic notch. Faint calcifications in the hamstring tendons proximally, unchanged from prior. Calcification in the pubic symphysis. Contrast medium noted in the urinary bladder. Subtle presacral stranding, similar to prior. Right direct inguinal hernia contains adipose tissue. IMPRESSION: 1. No hip effusion, regional bursitis, or specific cause for right hip pain identified. No acute bony findings. 2. Mild chondrocalcinosis suggesting CPPD arthropathy. 3. Small right direct inguinal hernia contains adipose tissue. 4. Sigmoid diverticulosis. 5. Atherosclerosis. Electronically Signed   By: Van Clines M.D.   On: 11/27/2016 16:31    Microbiology: Recent Results (from the past 240 hour(s))  Blood Culture (routine x 2)     Status: Abnormal   Collection Time: 11/23/16  8:15 PM  Result Value Ref Range Status   Specimen Description BLOOD RIGHT ANTECUBITAL  Final   Special Requests   Final    BOTTLES DRAWN AEROBIC AND ANAEROBIC Blood Culture adequate volume   Culture  Setup Time   Final    GRAM POSITIVE COCCI IN PAIRS AND CHAINS IN BOTH AEROBIC AND ANAEROBIC BOTTLES CRITICAL  VALUE NOTED.  VALUE IS CONSISTENT WITH PREVIOUSLY REPORTED AND CALLED VALUE.    Culture (A)  Final    STREPTOCOCCUS GALLOLYTICUS SUSCEPTIBILITIES PERFORMED ON PREVIOUS CULTURE WITHIN THE LAST 5 DAYS.    Report Status 11/26/2016 FINAL  Final  Blood Culture (routine x 2)     Status: Abnormal   Collection Time: 11/23/16  8:22 PM  Result Value Ref Range Status   Specimen Description BLOOD RIGHT WRIST  Final   Special Requests   Final    BOTTLES DRAWN AEROBIC AND ANAEROBIC Blood Culture  adequate volume   Culture  Setup Time   Final    GRAM POSITIVE COCCI IN PAIRS AND CHAINS IN BOTH AEROBIC AND ANAEROBIC BOTTLES CRITICAL RESULT CALLED TO, READ BACK BY AND VERIFIED WITH: M. Radford Pax Pharm.D. 10:35 11/24/16 ([email or web address])    Culture STREPTOCOCCUS GALLOLYTICUS (A)  Final   Report Status 11/26/2016 FINAL  Final   Organism ID, Bacteria STREPTOCOCCUS GALLOLYTICUS  Final      Susceptibility   Streptococcus gallolyticus - MIC*    PENICILLIN 0.12 SENSITIVE Sensitive     CEFTRIAXONE <=0.12 SENSITIVE Sensitive     ERYTHROMYCIN INTERMEDIATE Intermediate     LEVOFLOXACIN 2 SENSITIVE Sensitive     VANCOMYCIN 0.5 SENSITIVE Sensitive     * STREPTOCOCCUS GALLOLYTICUS  Blood Culture ID Panel (Reflexed)     Status: Abnormal   Collection Time: 11/23/16  8:22 PM  Result Value Ref Range Status   Enterococcus species NOT DETECTED NOT DETECTED Final   Listeria monocytogenes NOT DETECTED NOT DETECTED Final   Staphylococcus species NOT DETECTED NOT DETECTED Final   Staphylococcus aureus NOT DETECTED NOT DETECTED Final   Streptococcus species DETECTED (A) NOT DETECTED Final    Comment: Not Enterococcus species, Streptococcus agalactiae, Streptococcus pyogenes, or Streptococcus pneumoniae. CRITICAL RESULT CALLED TO, READ BACK BY AND VERIFIED WITH: M. Radford Pax Pharm.D. 10:35 11/24/16 ([email or web address])    Streptococcus agalactiae NOT DETECTED NOT DETECTED Final   Streptococcus pneumoniae NOT DETECTED NOT DETECTED Final    Streptococcus pyogenes NOT DETECTED NOT DETECTED Final   Acinetobacter baumannii NOT DETECTED NOT DETECTED Final   Enterobacteriaceae species NOT DETECTED NOT DETECTED Final   Enterobacter cloacae complex NOT DETECTED NOT DETECTED Final   Escherichia coli NOT DETECTED NOT DETECTED Final   Klebsiella oxytoca NOT DETECTED NOT DETECTED Final   Klebsiella pneumoniae NOT DETECTED NOT DETECTED Final   Proteus species NOT DETECTED NOT DETECTED Final   Serratia marcescens NOT DETECTED NOT DETECTED Final   Haemophilus influenzae NOT DETECTED NOT DETECTED Final   Neisseria meningitidis NOT DETECTED NOT DETECTED Final   Pseudomonas aeruginosa NOT DETECTED NOT DETECTED Final   Candida albicans NOT DETECTED NOT DETECTED Final   Candida glabrata NOT DETECTED NOT DETECTED Final   Candida krusei NOT DETECTED NOT DETECTED Final   Candida parapsilosis NOT DETECTED NOT DETECTED Final   Candida tropicalis NOT DETECTED NOT DETECTED Final  MRSA PCR Screening     Status: None   Collection Time: 11/24/16  3:25 AM  Result Value Ref Range Status   MRSA by PCR NEGATIVE NEGATIVE Final    Comment:        The GeneXpert MRSA Assay (FDA approved for NASAL specimens only), is one component of a comprehensive MRSA colonization surveillance program. It is not intended to diagnose MRSA infection nor to guide or monitor treatment for MRSA infections.   Culture, blood (routine x 2)     Status: None   Collection Time: 11/25/16 10:18 AM  Result Value Ref Range Status   Specimen Description BLOOD RIGHT WRIST  Final   Special Requests   Final    BOTTLES DRAWN AEROBIC AND ANAEROBIC Blood Culture adequate volume   Culture NO GROWTH 5 DAYS  Final   Report Status 11/30/2016 FINAL  Final  Culture, blood (routine x 2)     Status: None   Collection Time: 11/25/16 10:18 AM  Result Value Ref Range Status   Specimen Description BLOOD RIGHT HAND  Final   Special Requests   Final    BOTTLES DRAWN AEROBIC AND  ANAEROBIC Blood  Culture adequate volume   Culture NO GROWTH 5 DAYS  Final   Report Status 11/30/2016 FINAL  Final     Labs: Basic Metabolic Panel:  Recent Labs Lab 11/26/16 0138 11/28/16 0346 11/29/16 1648 12/01/16 1141  NA 136 134* 133* 137  K 3.7 4.2 4.1 5.5*  CL 96* 94* 93* 97*  CO2 26 25 25 23   GLUCOSE 140* 108* 119* 102*  BUN 45* 49* 63* 53*  CREATININE 5.77* 6.13* 7.23* 6.86*  CALCIUM 8.2* 8.2* 7.8* 7.9*  PHOS 3.5  --  4.9* 5.4*   Liver Function Tests:  Recent Labs Lab 11/26/16 0138 11/29/16 1648 12/01/16 1141  ALBUMIN 2.4* 2.4* 2.4*   No results for input(s): LIPASE, AMYLASE in the last 168 hours. No results for input(s): AMMONIA in the last 168 hours. CBC:  Recent Labs Lab 11/26/16 0138 11/28/16 0346 11/29/16 1648  WBC 12.9* 10.9* 10.3  NEUTROABS 11.6*  --   --   HGB 10.1* 10.7* 10.0*  HCT 31.3* 32.5* 30.5*  MCV 94.8 93.7 93.6  PLT 166 195 226   Cardiac Enzymes: No results for input(s): CKTOTAL, CKMB, CKMBINDEX, TROPONINI in the last 168 hours. BNP: BNP (last 3 results) No results for input(s): BNP in the last 8760 hours.  ProBNP (last 3 results)  Recent Labs  11/11/16 1143  PROBNP 18,028*    CBG:  Recent Labs Lab 11/30/16 2114 12/01/16 1003 12/01/16 1202 12/01/16 2127 12/02/16 0745  GLUCAP 172* 124* 113* 81 75       Signed:  Rolanda Campa MD, PhD  [facility] 12/02/2016, 10:36 AM

## 2016-12-02 NOTE — Progress Notes (Signed)
Patient discharged to [facility] with [place].

## 2016-12-02 NOTE — Clinical Social Work Note (Signed)
CSW facilitated patient discharge including contacting patient family and facility to confirm patient discharge plans. Clinical information faxed to facility and family agreeable with plan. CSW arranged ambulance transport via [facility] to [facility] at 3:30 pm. RN to call report prior to discharge [phone]).  CSW will sign off for now as social work intervention is no longer needed. Please consult [place] again if new needs arise.  Dayton Scrape, [place]

## 2016-12-02 NOTE — Progress Notes (Signed)
[facility] Progress Note  Subjective: no c/o  Vitals:   12/01/16 2234 12/02/16 0404 12/02/16 0822 12/02/16 1012  BP:  (!) 118/57 (!) 105/59   Pulse:  88 62 92  Resp:   12   Temp: (!) 97.5 F (36.4 C) (!) 97.5 F (36.4 C) 97.8 F (36.6 C)   TempSrc: Oral Axillary Oral   SpO2:  99% 100%   Weight:      Height:        Inpatient medications: . atorvastatin  20 mg Oral Daily  . bromocriptine  5 mg Oral QHS  . calcium acetate  667 mg Oral TID WC  . cinacalcet  30 mg Oral QPM  . diltiazem  30 mg Oral Q12H  . dorzolamide-timolol  1 drop Both Eyes BID  . loratadine  10 mg Oral Daily  . mouth rinse  15 mL Mouth Rinse BID  . metoprolol tartrate  50 mg Oral BID  . midodrine  15 mg Oral Q M,W,F-HD  . pantoprazole  40 mg Oral Daily  . polyethylene glycol  17 g Oral Daily  . predniSONE  5 mg Oral Daily  . sertraline  50 mg Oral Daily  . warfarin  5 mg Oral q1800  . Warfarin - Pharmacist Dosing Inpatient   Does not apply q1800   .  ceFAZolin (ANCEF) IV Stopped (12/01/16 2224)  . methocarbamol (ROBAXIN)  IV Stopped (12/01/16 2224)   acetaminophen, bisacodyl, bisacodyl, methocarbamol (ROBAXIN)  IV, morphine injection, MUSCLE RUB, ondansetron **OR** ondansetron (ZOFRAN) IV, senna-docusate  Exam: NAD, elderly male No jvd Chest clear bilat RRR irreg irreg rhythm, soft murmur Abd soft ntnd no ascites Ext no edema Neuro is NF, Ox 3   CT chest - negative ECHO - no vegetation CT neck - spondylitic changes, no discitis/ abscess  Dialysis: [facility] MWF 4h   88.5kg   Hep 2800  LFA AVF -mircera 50 mg last on 8/15, last Hb 12 on 8/22      Impression: 1  ESRD  - HD MWF 2  Strep gallolyticus bacteremia - sp TEE, no vegetations.  Per primary ID said Ancef with HD is ok, we will give w/ OP HD.  Needs only 5-6 more days of IV abx per ID (2 wks total from last neg BCx on 8/30).  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) 9  SP PPM 10 Cleora Fleet - demand ischem 11  Vol - at dry wt , euvolemic on exam  Plan - ok for dc to SNF today   Kelly Splinter MD [facility] pager [phone]   12/02/2016, 11:08 AM    Recent Labs Lab 11/26/16 0138 11/28/16 0346 11/29/16 1648 12/01/16 1141  NA 136 134* 133* 137  K 3.7 4.2 4.1 5.5*  CL 96* 94* 93* 97*  CO2 _0 GLUCOSE 140* 108* 119* 102*  BUN 45* 49* 63* 53*  CREATININE 5.77* 6.13* 7.23* 6.86*  CALCIUM 8.2* 8.2* 7.8* 7.9*  PHOS 3.5  --  4.9* 5.4*    Recent Labs Lab 11/26/16 0138 11/29/16 1648 12/01/16 1141  ALBUMIN 2.4* 2.4* 2.4*    Recent Labs Lab 11/26/16 0138 11/28/16 0346 11/29/16 1648  WBC 12.9* 10.9* 10.3  NEUTROABS 11.6*  --   --  HGB 10.1* 10.7* 10.0*  HCT 31.3* 32.5* 30.5*  MCV 94.8 93.7 93.6  PLT 166 195 226   Iron/TIBC/Ferritin/ %Sat No results found for: IRON, TIBC, FERRITIN, IRONPCTSAT

## 2016-12-02 NOTE — Progress Notes (Signed)
Discharge note. RN called report to Alberteen Sam, supervising RN at [facility]. Rn went over medications, plan of care for patient, and timeline of events while patient was admitted at [facility]. RN educated patient and family member at bedside on medications and patient's plan of care. [facility] set to arrive to transport patient at 1530.

## 2016-12-02 NOTE — Progress Notes (Signed)
Physical Therapy Treatment Patient Details Name: Alexander [name] MRN: [id number] DOB: 1941/03/22 Today's Date: 12/02/2016    History of Present Illness Alexander [name] is a 76 y.o. male with medical history significant of anemia, aortic stenosis, PUD, GERD, Barrett's esophagus, carpal tunnel syndrome, paroxysmal atrial fibrillation, history of bradycardia (S/Pdual lead pacemaker placement), CVA, depression, type 2 diabetes, diabetic peripheral neuropathy, diverticulosis, ESRD on hemodialysis, glaucoma, hyperlipidemia, hypertension, nephrolithiasis, macular degeneration/legally blind, seasonal allergies, tubular adenoma of the colon who is coming to the emergency department generalized body aches and malaise, muscle spasms of all extremities since monday after he finished hemodialysis. He was admitted for evaluation of meningitis, to step down. Meanwhile he developed a fib with RVR. Blood cultures positive pr streptococcus bacteremia.    PT Comments    Patient continues to be limited by R hip pain however agreeable to mobilize. Pt with increase pain in standing and tolerated short distance gait of less than 85ft. Daughter present. Current plan remains appropriate.    Follow Up Recommendations  SNF;Supervision/Assistance - 24 hour     Equipment Recommendations  None recommended by PT    Recommendations for Other Services       Precautions / Restrictions Precautions Precautions: Fall Precaution Comments: pt is legally blind Restrictions Weight Bearing Restrictions: No    Mobility  Bed Mobility Overal bed mobility: Needs Assistance Bed Mobility: Rolling;Sidelying to Sit Rolling: Min assist Sidelying to sit: Min assist       General bed mobility comments: assist to bring R LE to EOB and to elevate trunk into sitting due to pain; pt attempted to come into sitting without assist  Transfers Overall transfer level: Needs assistance Equipment used: Rolling walker (2 wheeled) Transfers:  Sit to/from Stand Sit to Stand: Min assist         General transfer comment: assist to power up into standing and to gain balance upon stand due to R hip pain  Ambulation/Gait Ambulation/Gait assistance: Min assist Ambulation Distance (Feet): 8 Feet Assistive device: Rolling walker (2 wheeled) Gait Pattern/deviations: Step-through pattern;Decreased stance time - right;Decreased step length - left;Decreased step length - right;Shuffle;Antalgic;Trunk flexed Gait velocity: decreased   General Gait Details: mulitmodal cues for posture; pt limited by pain; requires directional cues due to vision impairments   Stairs            Wheelchair Mobility    Modified Rankin (Stroke Patients Only)       Balance Overall balance assessment: Needs assistance Sitting-balance support: Bilateral upper extremity supported;Feet supported Sitting balance-Leahy Scale: Fair     Standing balance support: Bilateral upper extremity supported Standing balance-Leahy Scale: Poor                              Cognition Arousal/Alertness: Lethargic Behavior During Therapy: WFL for tasks assessed/performed Overall Cognitive Status: History of cognitive impairments - at baseline                                        Exercises      General Comments        Pertinent Vitals/Pain Pain Assessment: Faces Faces Pain Scale: Hurts whole lot Pain Location: R posterior hip Pain Descriptors / Indicators: Cramping;Grimacing;Guarding;Moaning;Sharp Pain Intervention(s): Limited activity within patient's tolerance;Monitored during session;Premedicated before session;Repositioned;Heat applied    Home Living  Prior Function            PT Goals (current goals can now be found in the care plan section) Acute Rehab PT Goals Patient Stated Goal: none stated PT Goal Formulation: With patient/family Time For Goal Achievement: 12/13/16 Potential  to Achieve Goals: Fair Progress towards PT goals: Not progressing toward goals - comment (limited by pain)    Frequency    Min 2X/week      PT Plan Current plan remains appropriate    Co-evaluation              AM-PAC PT "6 Clicks" Daily Activity  Outcome Measure  Difficulty turning over in bed (including adjusting bedclothes, sheets and blankets)?: Unable Difficulty moving from lying on back to sitting on the side of the bed? : Unable Difficulty sitting down on and standing up from a chair with arms (e.g., wheelchair, bedside commode, etc,.)?: Unable Help needed moving to and from a bed to chair (including a wheelchair)?: A Little Help needed walking in hospital room?: A Lot Help needed climbing 3-5 steps with a railing? : Total 6 Click Score: 9    End of Session Equipment Utilized During Treatment: Gait belt Activity Tolerance: Patient limited by pain Patient left: in chair;with call bell/phone within reach;with family/visitor present;with chair alarm set Nurse Communication: Mobility status PT Visit Diagnosis: Unsteadiness on feet (R26.81);Muscle weakness (generalized) (M62.81);Difficulty in walking, not elsewhere classified (R26.2);Pain Pain - Right/Left: Right Pain - part of body: Hip (neck)     Time: 0935-1000 PT Time Calculation (min) (ACUTE ONLY): 25 min  Charges:  $Gait Training: 8-22 mins $Therapeutic Activity: 8-22 mins                    G Codes:       Alexander [name], PTA Pager: [phone]     Alexander [name] 12/02/2016, 1:44 PM

## 2016-12-02 NOTE — Clinical Social Work Placement (Signed)
   CLINICAL SOCIAL WORK PLACEMENT  NOTE  Date:  12/02/2016  Patient Details  Name: Alexander [name] MRN: [id number] Date of Birth: [birth year]  Clinical Social Work is seeking post-discharge placement for this patient at the [place] level of care (*CSW will initial, date and re-position this form in  chart as items are completed):  Yes   Patient/family provided with [place] Work Department's list of facilities offering this level of care within the geographic area requested by the patient (or if unable, by the patient's family).  Yes   Patient/family informed of their freedom to choose among providers that offer the needed level of care, that participate in Medicare, Medicaid or managed care program needed by the patient, have an available bed and are willing to accept the patient.  Yes   Patient/family informed of [facility]'s ownership interest in [facility] and [facility], as well as of the fact that they are under no obligation to receive care at these facilities.  PASRR submitted to EDS on 11/30/16     PASRR number received on 12/01/16     Existing PASRR number confirmed on       FL2 transmitted to all facilities in geographic area requested by pt/family on 11/30/16     FL2 transmitted to all facilities within larger geographic area on       Patient informed that his/her managed care company has contracts with or will negotiate with certain facilities, including the following:        Yes   Patient/family informed of bed offers received.  Patient [facility] bed at [facility]     Physician recommends and patient chooses bed at      Patient to be transferred to [facility] on 12/02/16.  Patient to be transferred to facility by [facility]     Patient family notified on 12/02/16 of transfer.  Name of family member notified:  Chez Bulnes     PHYSICIAN Please prepare prescriptions     Additional Comment:     _______________________________________________ Candie Chroman, LCSW 12/02/2016, 10:50 AM

## 2016-12-02 NOTE — Progress Notes (Signed)
[facility] for warfarin Indication: atrial fibrillation and stroke  Allergies  Allergen Reactions  . Penicillins Swelling    SWELLING REACTION UNSPECIFIED   PATIENT HAD A PCN REACTION WITH IMMEDIATE RASH, FACIAL/TONGUE/THROAT SWELLING, SOB, OR LIGHTHEADEDNESS WITH HYPOTENSION:  #  #  #  YES  #  #  #   Has patient had a PCN reaction causing severe rash involving mucus membranes or skin necrosis: No Has patient had a PCN reaction that required hospitalization No Has patient had a PCN reaction occurring within the last 10 years: No If all of the above answers are "NO", then may proceed with Cephalosporin use.  . Codeine Nausea Only  . Tramadol Nausea Only    Patient Measurements: Height: 5' 10.5" (179.1 cm) Weight: 196 lb 3.4 oz (89 kg) IBW/kg (Calculated) : 74.15  Vital Signs: Temp: 97.8 F (36.6 C) (09/06 0822) Temp Source: Oral (09/06 0822) BP: 105/59 (09/06 0822) Pulse Rate: 62 (09/06 0822)  Labs:  Recent Labs  11/29/16 1648 11/30/16 0225 12/01/16 0311 12/01/16 1141 12/02/16 0354  HGB 10.0*  --   --   --   --   HCT 30.5*  --   --   --   --   PLT 226  --   --   --   --   LABPROT  --  17.5* 19.0*  --  22.2*  INR  --  1.44 1.61  --  1.97  CREATININE 7.23*  --   --  6.86*  --     Estimated Creatinine Clearance: 9.6 mL/min (A) (by C-G formula based on SCr of 6.86 mg/dL (H)).    Assessment: 76 year old male with history of afib and cva on chronic coumadin prior to admission. INR has been elevated since admission and peaked at 7. Patient had nose bleed 9/2 and 5mg  of vitamin k was given total.   INR today = 1.97, no new bleeding noted  Goal of Therapy:  INR 2-3 Monitor platelets by anticoagulation protocol: Yes   Plan:  Warfarin 5mg  po daily  Thank you Anette Guarneri, PharmD [phone] 12/02/2016 9:48 AM

## 2016-12-02 NOTE — Progress Notes (Addendum)
PHARMACY CONSULT NOTE FOR:  OUTPATIENT  PARENTERAL ANTIBIOTIC THERAPY (OPAT)  Indication: Streptococcal bacteremia Regimen: Cefazolin 2 grams iv MWF with HD End date: 12/09/16  Easier dosing with hemodialysis, avoiding PICC placement for Ceftriaxone therapy Discussed with ID and approved changed to Cefazolin Thank you for allowing pharmacy to be a part of this patient's care.  Tad Moore 12/02/2016, 9:46 AM

## 2016-12-03 ENCOUNTER — Other Ambulatory Visit: Payer: Self-pay | Admitting: Licensed Clinical Social Worker

## 2016-12-03 ENCOUNTER — Telehealth: Payer: Self-pay | Admitting: Cardiology

## 2016-12-03 ENCOUNTER — Other Ambulatory Visit: Payer: Self-pay | Admitting: *Deleted

## 2016-12-03 ENCOUNTER — Other Ambulatory Visit: Payer: Self-pay

## 2016-12-03 ENCOUNTER — Encounter: Payer: Self-pay | Admitting: *Deleted

## 2016-12-03 DIAGNOSIS — Z23 Encounter for immunization: Secondary | ICD-10-CM | POA: Diagnosis not present

## 2016-12-03 DIAGNOSIS — E1129 Type 2 diabetes mellitus with other diabetic kidney complication: Secondary | ICD-10-CM | POA: Diagnosis not present

## 2016-12-03 DIAGNOSIS — D631 Anemia in chronic kidney disease: Secondary | ICD-10-CM | POA: Diagnosis not present

## 2016-12-03 DIAGNOSIS — R7881 Bacteremia: Secondary | ICD-10-CM | POA: Diagnosis not present

## 2016-12-03 DIAGNOSIS — N186 End stage renal disease: Secondary | ICD-10-CM | POA: Diagnosis not present

## 2016-12-03 DIAGNOSIS — N2581 Secondary hyperparathyroidism of renal origin: Secondary | ICD-10-CM | POA: Diagnosis not present

## 2016-12-03 DIAGNOSIS — A409 Streptococcal sepsis, unspecified: Secondary | ICD-10-CM

## 2016-12-03 NOTE — Patient Outreach (Signed)
[facility]) Care Management [place] Coordination  12/03/2016  Alexander [name] 1940/04/24 [id number]  Secure communication via EMR to [place] and [place], notifying of patient's hospital discharge on 12/02/16 to SNF ([facility]) re: Alexander [name],76 y.o.malereferred to [facility] from [facility] telephonic RN CM on original MD referral. Patient has history including, but not limited to, PAD, Aortic stenosis with TAVR 08/31/16, ESRD on HD (M, W, F), A-Fib on coumadin, DM, CVA in 2004, CHF, macular degeneration with legal blindness, and multiple falls.  Patient presented to hospital 11/23/16 for chest pain and general malaise, sepsis, and A-fib with RVR.  From review of EMR, I was unable to determine if patient's SNF placement is planned for long-term or for short term rehab; made [place] and [place] aware that [facility] CSW is not currently active and that [facility] CSW referral would need to be placed if patient was discharged to SNF for short-term rehabilitation.  Plan:  [facility] RN CM will close nursing programand will collaborate with [facility] RN CM and [facility] CSW for SNF discharge disposition, as indicated.  [name], RN, BSN, [employer] [facility] Care Management  [phone]

## 2016-12-03 NOTE — Telephone Encounter (Signed)
Informed ok to reschedule OV as this was a routine 6 mo f/u. Dtr will call back to reschedule his appt.

## 2016-12-03 NOTE — Telephone Encounter (Signed)
Returned call to dtr (DPR on file). She tells me she will call me back (she is at chemo with her mother)

## 2016-12-03 NOTE — Patient Outreach (Signed)
[facility]) Care Management  [facility] Social Work  12/03/2016  Alexander [name] 29-Dec-1940 [id number]   Encounter Medications:  Outpatient Encounter Prescriptions as of 12/03/2016  Medication Sig Note  . acetaminophen (TYLENOL) 325 MG tablet Take 2 tablets (650 mg total) by mouth every 6 (six) hours as needed for mild pain.   [name] atorvastatin (LIPITOR) 40 MG tablet Take 20 mg by mouth daily.   [name] b complex-vitamin c-folic acid (NEPHRO-VITE) 0.8 MG TABS Take 1 tablet by mouth daily.    . bromocriptine (PARLODEL) 5 MG capsule Take 5 mg by mouth at bedtime.    . calcium acetate (PHOSLO) 667 MG capsule Take 667 mg by mouth 4 (four) times daily.    [name] ceFAZolin (ANCEF) 2-4 GM/100ML-% IVPB Indication: Streptococcal bacteremia Regimen: Cefazolin 2 grams iv MWF with HD End date: 12/09/16  Easier dosing with hemodialysis, avoiding PICC placement for Ceftriaxone therapy   . cetirizine (ZYRTEC) 10 MG tablet Take 10 mg by mouth at bedtime.    . cinacalcet (SENSIPAR) 30 MG tablet Take 30 mg by mouth every evening.    . diltiazem (CARDIZEM) 30 MG tablet Take 1 tablet (30 mg total) by mouth every 12 (twelve) hours.   . dorzolamide-timolol (COSOPT) 22.3-6.8 MG/ML ophthalmic solution Place 1 drop into the right eye 2 (two) times daily.   . methocarbamol (ROBAXIN) 500 MG tablet Take 1 tablet (500 mg total) by mouth every 6 (six) hours as needed for muscle spasms.   . metoprolol tartrate (LOPRESSOR) 50 MG tablet Take 1 tablet (50 mg total) by mouth 2 (two) times daily.   . midodrine (PROAMATINE) 5 MG tablet Take 50m (3 tablets) three times weekly as needed for low blood pressure after dialysis.   .Marland Kitchenomeprazole (PRILOSEC) 20 MG capsule Take 20 mg by mouth daily.   .Marland KitchenoxyCODONE-acetaminophen (ROXICET) 5-325 MG tablet Take 1 tablet by mouth every 8 (eight) hours as needed for severe pain.   . polyethylene glycol (MIRALAX / GLYCOLAX) packet Take 17 g by mouth daily.   . predniSONE (DELTASONE) 5 MG  tablet Take 5 mg by mouth daily.    .Marland Kitchensenna-docusate (SENOKOT-S) 8.6-50 MG tablet Take 1 tablet by mouth at bedtime.   . sertraline (ZOLOFT) 50 MG tablet Take 50 mg by mouth daily.   .Marland Kitchenwarfarin (COUMADIN) 5 MG tablet Take 1-1.5 tablets by mouth daily as directed by coumadin clinic (Patient taking differently: Take 5-7.5 mg by mouth See admin instructions. Take 1-1.5 tablets by mouth daily as directed by coumadin clinic  Current dose is 5 mg (1 tablet) daily.) 08/27/2016: Last dose was on 08/25/16 PM   No facility-administered encounter medications on file as of 12/03/2016.     Functional Status:  In your present state of health, do you have any difficulty performing the following activities: 11/24/2016 11/24/2016  Hearing? N N  Comment - -  Vision? Y -  Comment - -  Difficulty concentrating or making decisions? N -  Comment - -  Walking or climbing stairs? Y -  Comment - -  Dressing or bathing? N -  Comment - -  Doing errands, shopping? [place] ? - -  Comment - -  Using the Toilet? - -  Comment - -  In the past six months, have you accidently leaked urine? - -  Comment - -  Do you have problems with loss of bowel control? - -  Comment - -  Managing your Medications? - -  Comment - -  Managing your Finances? - -  Comment - -  Housekeeping or managing your Housekeeping? - -  Comment - -  Some recent data might be hidden    Fall/Depression Screening:  PHQ 2/9 Scores 12/03/2016 10/26/2016 10/04/2016  PHQ - 2 Score 0 0 -  Exception Documentation - - Other- indicate reason in comment box  Not completed - - call completed with dtr    Assessment: CSW received new referral on patient on 12/03/16 and completed SNF visit at [facility] with patient and family. Patient is already active with [facility]. Consent signed.Patient has history including, but not limited to, PAD, Aortic stenosis with TAVR 08/31/16, ESRD on HD (M, W, F), A-Fib on  coumadin, DM, CVA in 2004, CHF, macular degeneration with legal blindness, and multiple falls. CSW introduced self, reason for visit and of [place] social work services to family. CSW previously worked with family on gaining personal care resource information in order to gain an aide for patient and his spouse. Patient and family do not want LTC placement at this time. [name] states that other family members assist her with caregiving but she is in need of additional help. CSW placed patient on the [facility] [place] [facility] program wait list on 10/19/16. Current wait for program is up to 1 year. CSW informed [name] that it would be wise to look for an aide now so that services can be start as soon as patient discharges back home. CSW emailed [name] a list of private pay agencies in the past but can do so again. CSW stated that if family is looking for a more affordable option then sometimes families will network within the community Theatre manager, church, Social research officer, government) to find an aide that still has CNA experience but that is not attached to an agency so they would be more affordable. CSW provided additional advice and information in regards to this. CSW also provided education on day programs such as P[facility], [facility], [facility] which was discussed previously on 10/18/16. Daughter appreciative of information. Daughter shares that patient seems to be suffering from memory loss since this most recent hospitalization which concerns her. Family is hopeful that this will improve over the next few days. [name] reports that patient has stable transportation through family members and [employer]. Family deny needing transportation assistance, financial assistance and food assistance. CSW was unable to physically meet with SNF social worker to discuss patient's case but contacted SNF social worker and left a message requesting a return call once available.   [facility] CM Care Plan Problem One     Most Recent Value  Care Plan Problem One  SNF admission  Role  Documenting the Problem One  Clinical Social Worker  Care Plan for Problem One  Active  [facility] Long Term Goal   Patient will have a safe and stable discharge back home from SNF  [place] Term Goal Start Date  12/03/16  Interventions for Problem One Long Term Goal  CSW completed SNF visit today and met with patient and family. CSW will follow patient and will coordinate care with SNF social worker to ensure that patient has all needed resources, services and medical equipment to ensure a safe and stable discharge from SNF  [facility] CM Short Term Goal #1   Family will gain personal care resources and will explore them within 30 days  [facility] CM Short Term Goal #1 Start Date  12/03/16  Interventions for  Short Term Goal #1  Patient does not have an aide at this time. Family is interested in gaining an aide for patient and his spouse. CSW emailed daughter [name] personal care resources and educated her on those services. CSW will follow up as needed.     Plan: CSW will route encounter to PCP. CSW will follow up with SNF within one week. CSW will continue to provide social work assistance to patient.   [name], BSW, MSW, [email or web address] Phone: [phone] Fax: [phone]

## 2016-12-03 NOTE — Consult Note (Signed)
Active patient discharged to [facility] and [facility] for post hospital follow up in skilled facility.  Natividad Brood, RN BSN [facility] Liaison  [phone] business mobile phone Toll free office [phone]

## 2016-12-03 NOTE — Telephone Encounter (Signed)
Follow Up   Pt daughter  Calling back

## 2016-12-03 NOTE — Telephone Encounter (Signed)
New message    Pt daughter wants to know if pt can rs appt on Monday. Or if Dr. Curt Bears thinks pt really needs to come. Please call.

## 2016-12-06 ENCOUNTER — Encounter: Payer: Medicare Other | Admitting: Cardiology

## 2016-12-06 DIAGNOSIS — R7881 Bacteremia: Secondary | ICD-10-CM | POA: Diagnosis not present

## 2016-12-06 DIAGNOSIS — N2581 Secondary hyperparathyroidism of renal origin: Secondary | ICD-10-CM | POA: Diagnosis not present

## 2016-12-06 DIAGNOSIS — N186 End stage renal disease: Secondary | ICD-10-CM | POA: Diagnosis not present

## 2016-12-06 DIAGNOSIS — E1129 Type 2 diabetes mellitus with other diabetic kidney complication: Secondary | ICD-10-CM | POA: Diagnosis not present

## 2016-12-06 DIAGNOSIS — Z23 Encounter for immunization: Secondary | ICD-10-CM | POA: Diagnosis not present

## 2016-12-06 DIAGNOSIS — D631 Anemia in chronic kidney disease: Secondary | ICD-10-CM | POA: Diagnosis not present

## 2016-12-08 ENCOUNTER — Other Ambulatory Visit: Payer: Self-pay | Admitting: Licensed Clinical Social Worker

## 2016-12-08 DIAGNOSIS — R7881 Bacteremia: Secondary | ICD-10-CM | POA: Diagnosis not present

## 2016-12-08 DIAGNOSIS — N186 End stage renal disease: Secondary | ICD-10-CM | POA: Diagnosis not present

## 2016-12-08 DIAGNOSIS — Z23 Encounter for immunization: Secondary | ICD-10-CM | POA: Diagnosis not present

## 2016-12-08 DIAGNOSIS — N2581 Secondary hyperparathyroidism of renal origin: Secondary | ICD-10-CM | POA: Diagnosis not present

## 2016-12-08 DIAGNOSIS — D631 Anemia in chronic kidney disease: Secondary | ICD-10-CM | POA: Diagnosis not present

## 2016-12-08 DIAGNOSIS — E1129 Type 2 diabetes mellitus with other diabetic kidney complication: Secondary | ICD-10-CM | POA: Diagnosis not present

## 2016-12-08 NOTE — Patient Outreach (Signed)
Alexander [name]) Care Management  12/08/2016  Alexander [name] [birth year] [id number]  Assessment- CSW completed call to Alexander [name] and asked to be transferred to SNF social worker. CSW was informed that social workers are out of the office today but that CSW could leave a voice message. CSW left a voice message with SNF social worker and requested a call back once available.  Plan-CSW will follow up within one week and will await for return call from SNF.  Alexander [name], BSW, MSW, [name] Phone: [phone] Fax: [phone]

## 2016-12-09 DIAGNOSIS — R1010 Upper abdominal pain, unspecified: Secondary | ICD-10-CM | POA: Diagnosis not present

## 2016-12-09 DIAGNOSIS — M6281 Muscle weakness (generalized): Secondary | ICD-10-CM | POA: Diagnosis not present

## 2016-12-09 DIAGNOSIS — N186 End stage renal disease: Secondary | ICD-10-CM | POA: Diagnosis not present

## 2016-12-09 DIAGNOSIS — R2681 Unsteadiness on feet: Secondary | ICD-10-CM | POA: Diagnosis not present

## 2016-12-09 DIAGNOSIS — G609 Hereditary and idiopathic neuropathy, unspecified: Secondary | ICD-10-CM | POA: Diagnosis not present

## 2016-12-09 DIAGNOSIS — H47619 Cortical blindness, unspecified side of brain: Secondary | ICD-10-CM | POA: Diagnosis not present

## 2016-12-09 DIAGNOSIS — I6930 Unspecified sequelae of cerebral infarction: Secondary | ICD-10-CM | POA: Diagnosis not present

## 2016-12-10 DIAGNOSIS — Z23 Encounter for immunization: Secondary | ICD-10-CM | POA: Diagnosis not present

## 2016-12-10 DIAGNOSIS — I6930 Unspecified sequelae of cerebral infarction: Secondary | ICD-10-CM | POA: Diagnosis not present

## 2016-12-10 DIAGNOSIS — R7881 Bacteremia: Secondary | ICD-10-CM | POA: Diagnosis not present

## 2016-12-10 DIAGNOSIS — E1129 Type 2 diabetes mellitus with other diabetic kidney complication: Secondary | ICD-10-CM | POA: Diagnosis not present

## 2016-12-10 DIAGNOSIS — R1010 Upper abdominal pain, unspecified: Secondary | ICD-10-CM | POA: Diagnosis not present

## 2016-12-10 DIAGNOSIS — H47619 Cortical blindness, unspecified side of brain: Secondary | ICD-10-CM | POA: Diagnosis not present

## 2016-12-10 DIAGNOSIS — N2581 Secondary hyperparathyroidism of renal origin: Secondary | ICD-10-CM | POA: Diagnosis not present

## 2016-12-10 DIAGNOSIS — M6281 Muscle weakness (generalized): Secondary | ICD-10-CM | POA: Diagnosis not present

## 2016-12-10 DIAGNOSIS — D631 Anemia in chronic kidney disease: Secondary | ICD-10-CM | POA: Diagnosis not present

## 2016-12-10 DIAGNOSIS — R2681 Unsteadiness on feet: Secondary | ICD-10-CM | POA: Diagnosis not present

## 2016-12-10 DIAGNOSIS — G609 Hereditary and idiopathic neuropathy, unspecified: Secondary | ICD-10-CM | POA: Diagnosis not present

## 2016-12-10 DIAGNOSIS — N186 End stage renal disease: Secondary | ICD-10-CM | POA: Diagnosis not present

## 2016-12-13 ENCOUNTER — Other Ambulatory Visit: Payer: Self-pay | Admitting: Licensed Clinical Social Worker

## 2016-12-13 DIAGNOSIS — R7881 Bacteremia: Secondary | ICD-10-CM | POA: Diagnosis not present

## 2016-12-13 DIAGNOSIS — E1129 Type 2 diabetes mellitus with other diabetic kidney complication: Secondary | ICD-10-CM | POA: Diagnosis not present

## 2016-12-13 DIAGNOSIS — M6281 Muscle weakness (generalized): Secondary | ICD-10-CM | POA: Diagnosis not present

## 2016-12-13 DIAGNOSIS — G609 Hereditary and idiopathic neuropathy, unspecified: Secondary | ICD-10-CM | POA: Diagnosis not present

## 2016-12-13 DIAGNOSIS — N186 End stage renal disease: Secondary | ICD-10-CM | POA: Diagnosis not present

## 2016-12-13 DIAGNOSIS — D631 Anemia in chronic kidney disease: Secondary | ICD-10-CM | POA: Diagnosis not present

## 2016-12-13 DIAGNOSIS — Z23 Encounter for immunization: Secondary | ICD-10-CM | POA: Diagnosis not present

## 2016-12-13 DIAGNOSIS — R1010 Upper abdominal pain, unspecified: Secondary | ICD-10-CM | POA: Diagnosis not present

## 2016-12-13 DIAGNOSIS — N2581 Secondary hyperparathyroidism of renal origin: Secondary | ICD-10-CM | POA: Diagnosis not present

## 2016-12-13 DIAGNOSIS — H47619 Cortical blindness, unspecified side of brain: Secondary | ICD-10-CM | POA: Diagnosis not present

## 2016-12-13 DIAGNOSIS — I6930 Unspecified sequelae of cerebral infarction: Secondary | ICD-10-CM | POA: Diagnosis not present

## 2016-12-13 DIAGNOSIS — R2681 Unsteadiness on feet: Secondary | ICD-10-CM | POA: Diagnosis not present

## 2016-12-13 NOTE — Patient Outreach (Signed)
[facility]) Care Management  12/13/2016  Alexander [name] 1940-06-01 [id number]  Assessment- CSW completed outreach call to patient's caregiver and daughter [name]. CSW was unable to reach [place] successfully. CSW left a HIPPA compliant voice message requesting a return call in order to confirm that she successfully received requested community resource information by email.   Plan-CSW will await for return call and complete SNF visit this week at [facility]. No return call from SNF social worker has been made.  [name], BSW, MSW, [email or web address] Phone: [phone] Fax: [phone]

## 2016-12-14 ENCOUNTER — Ambulatory Visit (INDEPENDENT_AMBULATORY_CARE_PROVIDER_SITE_OTHER): Payer: Medicare Other | Admitting: Cardiovascular Disease

## 2016-12-14 ENCOUNTER — Encounter: Payer: Self-pay | Admitting: Cardiovascular Disease

## 2016-12-14 ENCOUNTER — Other Ambulatory Visit: Payer: Self-pay | Admitting: Licensed Clinical Social Worker

## 2016-12-14 VITALS — BP 122/66 | HR 74 | Ht 70.5 in | Wt 196.8 lb

## 2016-12-14 DIAGNOSIS — I481 Persistent atrial fibrillation: Secondary | ICD-10-CM | POA: Diagnosis not present

## 2016-12-14 DIAGNOSIS — E7800 Pure hypercholesterolemia, unspecified: Secondary | ICD-10-CM

## 2016-12-14 DIAGNOSIS — Z952 Presence of prosthetic heart valve: Secondary | ICD-10-CM | POA: Diagnosis not present

## 2016-12-14 DIAGNOSIS — I509 Heart failure, unspecified: Secondary | ICD-10-CM | POA: Diagnosis not present

## 2016-12-14 DIAGNOSIS — I4811 Longstanding persistent atrial fibrillation: Secondary | ICD-10-CM

## 2016-12-14 DIAGNOSIS — I9589 Other hypotension: Secondary | ICD-10-CM

## 2016-12-14 DIAGNOSIS — N186 End stage renal disease: Secondary | ICD-10-CM | POA: Diagnosis not present

## 2016-12-14 DIAGNOSIS — D631 Anemia in chronic kidney disease: Secondary | ICD-10-CM | POA: Diagnosis not present

## 2016-12-14 NOTE — Patient Instructions (Signed)
Medication Instructions:  CHANGE MIDODRINE TO 10 MG THREE TIMES A DAY AS NEEDED FOR LOW BLOOD PRESSURE ON Monday, Wednesday, AND Friday ONLY   Labwork: NONE  Testing/Procedures: NONE  Follow-Up: Your physician recommends that you schedule a follow-up appointment in: [place]  If you need a refill on your cardiac medications before your next appointment, please call your pharmacy.

## 2016-12-14 NOTE — Progress Notes (Signed)
Cardiology Office Note   Date:  12/14/2016   ID:  [name], DOB 07-May-1940, MRN [id number]  PCP:  Deland Pretty, MD  Cardiologist:   Skeet Latch, MD  Electrophysiologist: Allegra Lai, MD  No chief complaint on file.   History of Present Illness: Alexander [name] is a 76 y.o. male with severe aortic stenosis s/p TAVR, ESRD on HD, hypotension on midodrine, diabetes mellitus, hyperlipidemia, carotid artery disease with complete occlusion of the L carotid, prior TIA and paroxysmal atrial fibrillation  who presents for follow up.  He was initially evaluated on 12/04/14 with hypotension requiring midodrine and syncope.  He had an echo that revealed LVEF 60-65% with grade 2 diastolic dysfunction, moderate AS and mild MR. He wore a 48 hour Holter 11/2014 that revealed episodes of dizziness associated with heart rates in the 50s. He had a PPM implanted 01/2015.  He had a repeat monitor 12/2014 that showed atrial fibrillation with occasional junctional escape.  He subsequently had a Lexiscan Cardiolite 01/2015 that was negative for ischemia but showed an area of distal inferior infarct vs artifact.  He saw Dr. Curt Bears on 10/06/15 where he was found to have been in atrial fibrillation since April.  He went into sinus rhythm briefly after DCCV.  [name] had an echo 06/21/16 that revealed LVEF 60-65% and moderate aortic stenosis with a mean gradient of 33 mmHg.  He continued to complain of fatigue and dyspnea, so he had a TEE 07/22/16 that revealed normal systolic function and an aortic valve that appeared severely stenoitic, though gradients indicated moderate aortic stenosis.  [name] underwent placement of a 29 mm Sapien 3 TAVR on 08/2016.  He was subsequently admitted to [facility] with volume overload in the setting of confusion about midodrine and metoprolol  He inadvertently continue taking midodrine and stopped taking metoprolol.   He required extra dialysis. He subsequently  followed up with Dr. Angelena Form on 10/07/16 and was doing fairly well. He continued to struggle to get his dry weight regulated with dialysis.  Echo on 10/07/16 revealed LVEF 55-60% with mild LVH. The prostatic valve was functioning properly early without any significant stenosis or regurgitation. At his last appointment he reported significant fatigue and shortness of breath. He also reported orthopnea. He had a chest x-ray 11/11/16 that revealed mild cardiomegaly and tiny pleural effusions consistent with mild CHF. BNP was 18,000. I called his dialysis session and asked that they lower his dry weight to try using increase the amount of volume removed with each session.  Since his last appointment Mr. [name] was admitted to the hospital 11/2016 with sepsis 2/2 Strep bacteremia.  The sourse was unclear, but presumed to be 2/2 HD access.  He had a CT of the spine that showed no evidence of discitis or abscess.  He also had a TEE 12/01/16 that revealed no vegetations on his PPM lead and normal TAVR valve function.  There was moderate TR and moderate MR. His hypotension resolved prior to discharge.  Since discharge he has been receiving midodrine on the evening of his HD as needed.  He reports that his BP has been low in general, typically 90s/60s.  He notes that HD sometimes gets stopped early 2/2 low BP.  He has been feeling weak ever since leaving the hospital and is unable to walk.  He is at [facility] for rehab with a plan to be there for 21 days.  He denies chest pain or shortness of  breath.  He also hasn't noted any orthopnea.  He continues to have mild edema.  He also reports R sided abdominal pain that has been chronic for over a decade.  He denies fever, chills, nausea, constipation or diarrhea.     Past Medical History:  Diagnosis Date  . Anemia   . Aortic stenosis 06/15/12   TEE - EF 69-62%; grade 1 diastolic dysfunction; mild/mod aortic valve stenosis; Mitral valve had calcified annulus, mild pulm htn  PA peak pressure 69mmHg  . Barrett's esophagus 05/2003  . Bradycardia 2017   [employer] 2240 Assurity dual-lead pacemaker  . Carpal tunnel syndrome, bilateral 11/03/2015  . Colon polyps   . CVA (cerebral infarction)    2004/affected left side  . Depression   . Diabetes mellitus without complication ([place])   . Diabetic peripheral neuropathy ([place]) 10/02/2015  . Diverticulosis   . Dyspnea    with exertion  . End stage renal disease ([place])    hemodialysis 3 times a week  . GERD (gastroesophageal reflux disease)   . Glaucoma   . Hyperlipidemia   . Hypertension   . Kidney stones   . Legally blind   . Macular degeneration    both eyes  . Orthostatic hypotension 09/09/2015  . Paroxysmal atrial fibrillation (HCC)   . Peptic ulcer    bleeding, 1969  . Presence of permanent cardiac pacemaker   . S/P epidural steroid injection    last  injection over 10 years ago  . Seasonal allergies   . Tubular adenoma of colon 07/2001    Past Surgical History:  Procedure Laterality Date  . AV FISTULA PLACEMENT  2009  . BACK SURGERY    . CARDIOVERSION N/A 11/13/2015   Procedure: CARDIOVERSION;  Surgeon: Troy Sine, MD;  Location: [facility] ENDOSCOPY;  Service: Cardiovascular;  Laterality: N/A;  . CARDIOVERSION N/A 01/13/2016   Procedure: CARDIOVERSION;  Surgeon: Will Meredith [name], MD;  Location: [place];  Service: Cardiovascular;  Laterality: N/A;  . [place]   right eye  . CYSTOSCOPY  several times   kidney stones  . EP IMPLANTABLE DEVICE N/A 03/11/2015   Procedure: Pacemaker Implant;  Surgeon: Will Meredith [name], MD;  [place];  Laterality: Left  . LAMINECTOMY  1969  . RIGHT/LEFT HEART CATH AND CORONARY ANGIOGRAPHY N/A 08/20/2016   Procedure: Right/Left Heart Cath and Coronary Angiography;  Surgeon: Burnell Blanks, MD;  Location: [place] CV LAB;  Service: Cardiovascular;  Laterality: N/A;  . TEE WITHOUT CARDIOVERSION N/A 07/22/2016    Procedure: TRANSESOPHAGEAL ECHOCARDIOGRAM (TEE);  Surgeon: Skeet Latch, MD;  Location: [place];  Service: Cardiovascular;  Laterality: N/A;  . TEE WITHOUT CARDIOVERSION N/A 08/31/2016   Procedure: TRANSESOPHAGEAL ECHOCARDIOGRAM (TEE);  Surgeon: Burnell Blanks, MD;  Location: [place];  Service: Open Heart Surgery;  Laterality: N/A;  . TEE WITHOUT CARDIOVERSION N/A 12/01/2016   Procedure: TRANSESOPHAGEAL ECHOCARDIOGRAM (TEE);  Surgeon: Larey Dresser, MD;  Location[facility]COPY;  Service: Cardiovascular;  Laterality: N/A;  . TONSILLECTOMY  1964  . TRANSCATHETER AORTIC VALVE REPLACEMENT, TRANSFEMORAL N/A 08/31/2016   Procedure: TRANSCATHETER AORTIC VALVE REPLACEMENT, TRANSFEMORAL;  Surgeon: Burnell Blanks, MD;  Location: [place];  Service: Open Heart Surgery;  Laterality: N/A;     Current Outpatient Prescriptions  Medication Sig Dispense Refill  . acetaminophen (TYLENOL) 500 MG tablet Take 500 mg by mouth 2 (two) times daily.    . Menthol (ICY HOT) 5 % PTCH Apply topically. ON  IN THE AM AND OFF IN THE PM FOR 10 DAYS    . midodrine (PROAMATINE) 10 MG tablet Take 10 mg by mouth as directed. TAKE 1 TABLET THREE TIMES A DAY ON  Monday, Wednesday, AND Friday AFTER DIALYSIS AS NEEDED FOR LOW BLOOD PRESSURE    . acetaminophen (TYLENOL) 325 MG tablet Take 2 tablets (650 mg total) by mouth every 6 (six) hours as needed for mild pain.    [name] atorvastatin (LIPITOR) 40 MG tablet Take 20 mg by mouth daily.    [name] b complex-vitamin c-folic acid (NEPHRO-VITE) 0.8 MG TABS Take 1 tablet by mouth daily.     . bromocriptine (PARLODEL) 5 MG capsule Take 5 mg by mouth at bedtime.     . calcium acetate (PHOSLO) 667 MG capsule Take 667 mg by mouth 4 (four) times daily.     . cetirizine (ZYRTEC) 10 MG tablet Take 10 mg by mouth at bedtime.     . cinacalcet (SENSIPAR) 30 MG tablet Take 30 mg by mouth every evening.     . diltiazem (CARDIZEM) 30 MG tablet Take 1 tablet (30 mg total) by mouth every 12  (twelve) hours. 60 tablet 0  . dorzolamide-timolol (COSOPT) 22.3-6.8 MG/ML ophthalmic solution Place 1 drop into the right eye 2 (two) times daily.    . methocarbamol (ROBAXIN) 500 MG tablet Take 1 tablet (500 mg total) by mouth every 6 (six) hours as needed for muscle spasms. 10 tablet 0  . metoprolol tartrate (LOPRESSOR) 50 MG tablet Take 1 tablet (50 mg total) by mouth 2 (two) times daily. 60 tablet 0  . omeprazole (PRILOSEC) 20 MG capsule Take 20 mg by mouth daily.    [name] oxyCODONE-acetaminophen (ROXICET) 5-325 MG tablet Take 1 tablet by mouth every 8 (eight) hours as needed for severe pain. 5 tablet 0  . polyethylene glycol (MIRALAX / GLYCOLAX) packet Take 17 g by mouth daily. 14 each 0  . predniSONE (DELTASONE) 5 MG tablet Take 5 mg by mouth daily.   1  . senna-docusate (SENOKOT-S) 8.6-50 MG tablet Take 1 tablet by mouth at bedtime. 14 tablet 0  . sertraline (ZOLOFT) 50 MG tablet Take 50 mg by mouth daily.  5  . warfarin (COUMADIN) 5 MG tablet Take 1-1.5 tablets by mouth daily as directed by coumadin clinic (Patient taking differently: Take 5-7.5 mg by mouth See admin instructions. Take 1-1.5 tablets by mouth daily as directed by coumadin clinic  Current dose is 5 mg (1 tablet) daily.) 120 tablet 1   No current facility-administered medications for this visit.     Allergies:   Penicillins; Codeine; and Tramadol    Social History:  The patient  reports that he quit smoking about 18 years ago. His smoking use included Cigarettes. He has a 90.00 pack-year smoking history. He has never used smokeless tobacco. He reports that he does not drink alcohol or use drugs.   Family History:  The patient's family history includes Cancer in his brother; Heart attack in his father; Heart disease in his sister; Hypertension in his father; Stomach cancer in his mother; Stroke in his sister.    ROS:  Please see the history of present illness.   Otherwise, review of systems are positive for none.   All  other systems are reviewed and negative.    PHYSICAL EXAM: VS:  BP 122/66   Pulse 74   Ht 5' 10.5" (1.791 m)   Wt 89.3 kg (196 lb 12.8 oz)   BMI 27.84  kg/m  , BMI Body mass index is 27.84 kg/m. GENERAL:  Well appearing HEENT: Pupils equal round and reactive, fundi not visualized, oral mucosa unremarkable NECK:  No jugular venous distention, waveform within normal limits, carotid upstroke brisk and symmetric, no bruits, no thyromegaly LUNGS:  Clear to auscultation bilaterally HEART:  Irregularly irregular.  PMI not displaced or sustained,S1 and S2 within normal limits, no S3, no S4, no clicks, no rubs, no murmurs ABD:  Flat, positive bowel sounds normal in frequency in pitch, no bruits, no rebound, no guarding, no midline pulsatile mass, no hepatomegaly, no splenomegaly EXT:  2 plus pulses throughout,1+ pitting edema to above the ankles bilaterally, no cyanosis no clubbing SKIN:  No rashes no nodules NEURO:  Cranial nerves II through XII grossly intact, motor grossly intact throughout PSYCH:  Cognitively intact, oriented to person place and time  EKG:  EKG is ordered today. 09/09/15: Atrial fibrillation rate 108 bpm.  Prior inferior infarct. 01/16/15: Sinus bradycardia.  First degree heart block.  LAFB.  U waves. The ekg from 11/19/14 demonstrates sinus bradycardia at 49 bpm. First degree heart block.  08/06/16: Atrial fibrillation.  Rate 82 bpm.  Occcasional VP. 11/11/16: Atrial fibrillation. Rate 97 bpm. Left bundle branch block. CBC.  Lexiscan Cardiolite 01/28/15:  Nuclear stress EF: 67%.  The left ventricular ejection fraction is hyperdynamic (>65%).  There was no ST segment deviation noted during stress.  This is a low risk study.  Low risk stress nuclear study with a small, severe, predominantly fixed distal inferior defect consistent with small prior infarct vs thinning; no significant ischemia; EF 67 with normal wall motion.  Echo 10/07/16: Study Conclusions  - Left  ventricle: The cavity size was normal. Wall thickness was   increased in a pattern of mild LVH. Systolic function was normal.   The estimated ejection fraction was in the range of 55% to 60%.   Wall motion was normal; there were no regional wall motion   abnormalities. Doppler parameters are consistent with high   ventricular filling pressure. - Aortic valve: A bioprosthesis was present. - Mitral valve: Calcified annulus. There was mild regurgitation.   Valve area by pressure half-time: 2.08 cm^2. - Left atrium: The atrium was mildly dilated. - Right atrium: The atrium was moderately dilated. - Pulmonary arteries: Systolic pressure was mildly increased. PA   peak pressure: 36 mm Hg (S).  Impressions:  - Normal LV systolic function; mild LVH; elevated LV filling   pressure; s/p AVR with normal mean gradient (9 mmHg) and   calculated AVA of 1.4 cm2; no AI; mild MR; biatrial enlargement;   mild TR with mildly elevated pulmonary pressure.  Recent Labs: 11/11/2016: NT-Pro BNP 18,028 11/23/2016: ALT 37; Magnesium 1.5 11/29/2016: Hemoglobin 10.0; Platelets 226 12/01/2016: BUN 53; Creatinine, Ser 6.86; Potassium 5.5; Sodium 137    Lipid Panel    Component Value Date/Time   CHOL 138 10/28/2015 1008   TRIG 158 (H) 10/28/2015 1008   HDL 30 (L) 10/28/2015 1008   CHOLHDL 4.6 10/28/2015 1008   VLDL 32 (H) 10/28/2015 1008   LDLCALC 76 10/28/2015 1008      Wt Readings from Last 3 Encounters:  12/14/16 89.3 kg (196 lb 12.8 oz)  12/01/16 89 kg (196 lb 3.4 oz)  11/11/16 92.1 kg (203 lb)     Other studies Reviewed: Additional studies/ records that were reviewed today include: [facility] records Review of the above records demonstrates:  Please see elsewhere in the note.     ASSESSMENT  AND PLAN:  # Severe aortic stenosis s/p TAVR:  # Shortness of breath: TAVR is functioning properly.  Symptoms have improved and TAVR looked good on echo 11/2016.  No changes.    # Hypotension/syncope: Mr.  Grudzien continues to have some low BP on HD days and isn't able to complete his sessions.  We will change it to 10mg  tid as needed.     # Bradycardia/longstanding persistent atrial fibrillation:  Mr. Erion had a cardioversion but only went into sinus rhythm temporarily.  He was on amiodarone at the time.  He is currently in atrial fibrillation and rates are controlled. He is asymptomatic.  No nodal agents 2/2 bradycardia.  Continue warfarin for anticoagulation.   # Hyperlipidemia: LDL 76 10/2015.  Continue atorvastatin.  Will repeat at follow up.   Current medicines are reviewed at length with the patient today.  The patient does not have concerns regarding medicines.  The following changes have been made: None  Labs/ tests ordered today include:   No orders of the defined types were placed in this encounter.    Disposition:   FU with Dr. Jonelle Sidle C. Dennis in 1 months   Signed, Skeet Latch, MD  12/14/2016 11:02 AM    [place]

## 2016-12-15 ENCOUNTER — Other Ambulatory Visit: Payer: Self-pay | Admitting: Licensed Clinical Social Worker

## 2016-12-15 DIAGNOSIS — M6281 Muscle weakness (generalized): Secondary | ICD-10-CM | POA: Diagnosis not present

## 2016-12-15 DIAGNOSIS — D631 Anemia in chronic kidney disease: Secondary | ICD-10-CM | POA: Diagnosis not present

## 2016-12-15 DIAGNOSIS — E1129 Type 2 diabetes mellitus with other diabetic kidney complication: Secondary | ICD-10-CM | POA: Diagnosis not present

## 2016-12-15 DIAGNOSIS — R2681 Unsteadiness on feet: Secondary | ICD-10-CM | POA: Diagnosis not present

## 2016-12-15 DIAGNOSIS — R7881 Bacteremia: Secondary | ICD-10-CM | POA: Diagnosis not present

## 2016-12-15 DIAGNOSIS — Z23 Encounter for immunization: Secondary | ICD-10-CM | POA: Diagnosis not present

## 2016-12-15 DIAGNOSIS — N2581 Secondary hyperparathyroidism of renal origin: Secondary | ICD-10-CM | POA: Diagnosis not present

## 2016-12-15 DIAGNOSIS — N186 End stage renal disease: Secondary | ICD-10-CM | POA: Diagnosis not present

## 2016-12-15 DIAGNOSIS — I6930 Unspecified sequelae of cerebral infarction: Secondary | ICD-10-CM | POA: Diagnosis not present

## 2016-12-15 DIAGNOSIS — G609 Hereditary and idiopathic neuropathy, unspecified: Secondary | ICD-10-CM | POA: Diagnosis not present

## 2016-12-15 DIAGNOSIS — H47619 Cortical blindness, unspecified side of brain: Secondary | ICD-10-CM | POA: Diagnosis not present

## 2016-12-15 DIAGNOSIS — R1010 Upper abdominal pain, unspecified: Secondary | ICD-10-CM | POA: Diagnosis not present

## 2016-12-15 NOTE — Patient Outreach (Signed)
[place] [facility]) Care Management  [facility] Social Work  12/15/2016  Alexander [name] June 01, 1940 [id number]   Encounter Medications:  Outpatient Encounter Prescriptions as of 12/15/2016  Medication Sig Note  . acetaminophen (TYLENOL) 325 MG tablet Take 2 tablets (650 mg total) by mouth every 6 (six) hours as needed for mild pain.   [name] acetaminophen (TYLENOL) 500 MG tablet Take 500 mg by mouth 2 (two) times daily.   [name] atorvastatin (LIPITOR) 40 MG tablet Take 20 mg by mouth daily.   [name] b complex-vitamin c-folic acid (NEPHRO-VITE) 0.8 MG TABS Take 1 tablet by mouth daily.    . bromocriptine (PARLODEL) 5 MG capsule Take 5 mg by mouth at bedtime.    . calcium acetate (PHOSLO) 667 MG capsule Take 667 mg by mouth 4 (four) times daily.    . cetirizine (ZYRTEC) 10 MG tablet Take 10 mg by mouth at bedtime.    . cinacalcet (SENSIPAR) 30 MG tablet Take 30 mg by mouth every evening.    . diltiazem (CARDIZEM) 30 MG tablet Take 1 tablet (30 mg total) by mouth every 12 (twelve) hours.   . dorzolamide-timolol (COSOPT) 22.3-6.8 MG/ML ophthalmic solution Place 1 drop into the right eye 2 (two) times daily.   . Menthol (ICY HOT) 5 % PTCH Apply topically. ON IN THE AM AND OFF IN THE PM FOR 10 DAYS   . methocarbamol (ROBAXIN) 500 MG tablet Take 1 tablet (500 mg total) by mouth every 6 (six) hours as needed for muscle spasms.   . metoprolol tartrate (LOPRESSOR) 50 MG tablet Take 1 tablet (50 mg total) by mouth 2 (two) times daily.   . midodrine (PROAMATINE) 10 MG tablet Take 10 mg by mouth as directed. TAKE 1 TABLET THREE TIMES A DAY ON  Monday, Wednesday, AND Friday AFTER DIALYSIS AS NEEDED FOR LOW BLOOD PRESSURE   . omeprazole (PRILOSEC) 20 MG capsule Take 20 mg by mouth daily.   [name] oxyCODONE-acetaminophen (ROXICET) 5-325 MG tablet Take 1 tablet by mouth every 8 (eight) hours as needed for severe pain.   . polyethylene glycol (MIRALAX / GLYCOLAX) packet Take 17 g by mouth daily.   . predniSONE (DELTASONE)  5 MG tablet Take 5 mg by mouth daily.    [name] senna-docusate (SENOKOT-S) 8.6-50 MG tablet Take 1 tablet by mouth at bedtime.   . sertraline (ZOLOFT) 50 MG tablet Take 50 mg by mouth daily.   [name] warfarin (COUMADIN) 5 MG tablet Take 1-1.5 tablets by mouth daily as directed by coumadin clinic (Patient taking differently: Take 5-7.5 mg by mouth See admin instructions. Take 1-1.5 tablets by mouth daily as directed by coumadin clinic  Current dose is 5 mg (1 tablet) daily.) 08/27/2016: Last dose was on 08/25/16 PM   No facility-administered encounter medications on file as of 12/15/2016.     Functional Status:  In your present state of health, do you have any difficulty performing the following activities: 11/24/2016 11/24/2016  Hearing? N N  Comment - -  Vision? Y -  Comment - -  Difficulty concentrating or making decisions? N -  Comment - -  Walking or climbing stairs? Y -  Comment - -  Dressing or bathing? N -  Comment - -  Doing errands, shopping? [place] and eating ? - -  Comment - -  Using the Toilet? - -  Comment - -  In the past six months, have you accidently leaked urine? - -  Comment - -  Do you have problems with loss of bowel control? - -  Comment - -  Managing your Medications? - -  Comment - -  Managing your Finances? - -  Comment - -  Housekeeping or managing your Housekeeping? - -  Comment - -  Some recent data might be hidden    Fall/Depression Screening:  PHQ 2/9 Scores 12/03/2016 10/26/2016 10/04/2016  PHQ - 2 Score 0 0 -  Exception Documentation - - Other- indicate reason in comment box  Not completed - - call completed with dtr    Assessment: CSW completed SNF visit at [facility] on 12/15/16. CSW has been unable to receive update from SNF social worker. SNF social worker was not available in the office when [place] arrived. CSW informed other SNF social worker that she has left a few voice messages from SNF social worker but has not heard back.  SNF social worker present at office is not able to provide any updates on patient but wrote down message to return call to [facility] CSW once available.   CSW went to patient's room while he was eating lunch. Patient reports that he just got back from the dialysis center. Patient's son in law was present during SNF visit. Patient provided verbal permission to allow CSW to complete visit and to discuss his case in front of son in law. Patient reports that he has been making improvements with mobility. He shares that he has been able to transfer in and out of his bed with his wheelchair beside his bed. Patient reports that his sleeping has improved this past week. Patient shares that his appetite is normal. Patient shares that he continues to receive PT, OT and Speech therapy. Patient reports that he continues to have difficulty with remembering certain words. He shares that this is frustrating for him but that his overall memory seems to be getting better since CSW last saw him. Patient reports that he had a very successful physician appointment yesterday with Dr. Skeet Latch that did his TAVR. Patient reports that his family members went with him to this appointment. He shares that he scheduled a 6 month follow up appointment. Patient confirms that plan remains the same (to discharge back home from SNF.)  CSW has not heard back from patient's daughter in order to confirm that community resource documents were successfully received.  Plan-CSW will await for return call from both SNF social worker and patient's daughter. CSW informed patient that CSW will be out of the office part of next week. CSW will continue to provide social work assistance and support and await for SNF discharge.  [name], BSW, MSW, [email or web address] Phone: [phone] Fax: [phone]

## 2016-12-17 DIAGNOSIS — R7881 Bacteremia: Secondary | ICD-10-CM | POA: Diagnosis not present

## 2016-12-17 DIAGNOSIS — Z23 Encounter for immunization: Secondary | ICD-10-CM | POA: Diagnosis not present

## 2016-12-17 DIAGNOSIS — N186 End stage renal disease: Secondary | ICD-10-CM | POA: Diagnosis not present

## 2016-12-17 DIAGNOSIS — D631 Anemia in chronic kidney disease: Secondary | ICD-10-CM | POA: Diagnosis not present

## 2016-12-17 DIAGNOSIS — N2581 Secondary hyperparathyroidism of renal origin: Secondary | ICD-10-CM | POA: Diagnosis not present

## 2016-12-17 DIAGNOSIS — E1129 Type 2 diabetes mellitus with other diabetic kidney complication: Secondary | ICD-10-CM | POA: Diagnosis not present

## 2016-12-20 DIAGNOSIS — N2581 Secondary hyperparathyroidism of renal origin: Secondary | ICD-10-CM | POA: Diagnosis not present

## 2016-12-20 DIAGNOSIS — D631 Anemia in chronic kidney disease: Secondary | ICD-10-CM | POA: Diagnosis not present

## 2016-12-20 DIAGNOSIS — E1129 Type 2 diabetes mellitus with other diabetic kidney complication: Secondary | ICD-10-CM | POA: Diagnosis not present

## 2016-12-20 DIAGNOSIS — Z23 Encounter for immunization: Secondary | ICD-10-CM | POA: Diagnosis not present

## 2016-12-20 DIAGNOSIS — N186 End stage renal disease: Secondary | ICD-10-CM | POA: Diagnosis not present

## 2016-12-20 DIAGNOSIS — M6281 Muscle weakness (generalized): Secondary | ICD-10-CM | POA: Diagnosis not present

## 2016-12-20 DIAGNOSIS — R1010 Upper abdominal pain, unspecified: Secondary | ICD-10-CM | POA: Diagnosis not present

## 2016-12-20 DIAGNOSIS — R7881 Bacteremia: Secondary | ICD-10-CM | POA: Diagnosis not present

## 2016-12-20 DIAGNOSIS — I6930 Unspecified sequelae of cerebral infarction: Secondary | ICD-10-CM | POA: Diagnosis not present

## 2016-12-20 DIAGNOSIS — H47619 Cortical blindness, unspecified side of brain: Secondary | ICD-10-CM | POA: Diagnosis not present

## 2016-12-20 DIAGNOSIS — R2681 Unsteadiness on feet: Secondary | ICD-10-CM | POA: Diagnosis not present

## 2016-12-20 DIAGNOSIS — G609 Hereditary and idiopathic neuropathy, unspecified: Secondary | ICD-10-CM | POA: Diagnosis not present

## 2016-12-21 ENCOUNTER — Other Ambulatory Visit: Payer: Self-pay | Admitting: Licensed Clinical Social Worker

## 2016-12-21 NOTE — Patient Outreach (Signed)
[place] [facility]) Care Management  12/21/2016  Alexander [name] 1940/04/14 [id number]  Assessment- CSW completed call to patient's daughter [name] and she answered. She reports that she is with patient at [facility] now. CSW was informed that PT told patient today that he has two weeks left at facility. CSW encouraged family to consider what medical equipment they need as well as which [facility] agency they wish to choose. Daughter questioned if CSW could contact [facility] RNCM to see what [facility] agency they used in the past most recently. CSW sent in basket message to [place]. Daughter confirmed that email with community resources was successfully received. CSW completed review of resources. CSW informed daughter that CSW will be out of the office on 12/23/16 and 12/24/16.   CSW completed chart review and became aware that patient was recently involved with [facility]. CSW completed call back to daughter and provided update.  Plan-CSW will continue to provide social work assistance and support and will await for SNF discharge. CSW will await for return call from SNF d/c planner as well.  [name], BSW, MSW, [email or web address] Phone: [phone] Fax: [phone]

## 2016-12-22 DIAGNOSIS — E1129 Type 2 diabetes mellitus with other diabetic kidney complication: Secondary | ICD-10-CM | POA: Diagnosis not present

## 2016-12-22 DIAGNOSIS — R7881 Bacteremia: Secondary | ICD-10-CM | POA: Diagnosis not present

## 2016-12-22 DIAGNOSIS — Z23 Encounter for immunization: Secondary | ICD-10-CM | POA: Diagnosis not present

## 2016-12-22 DIAGNOSIS — N2581 Secondary hyperparathyroidism of renal origin: Secondary | ICD-10-CM | POA: Diagnosis not present

## 2016-12-22 DIAGNOSIS — D631 Anemia in chronic kidney disease: Secondary | ICD-10-CM | POA: Diagnosis not present

## 2016-12-22 DIAGNOSIS — N186 End stage renal disease: Secondary | ICD-10-CM | POA: Diagnosis not present

## 2016-12-24 DIAGNOSIS — E1129 Type 2 diabetes mellitus with other diabetic kidney complication: Secondary | ICD-10-CM | POA: Diagnosis not present

## 2016-12-24 DIAGNOSIS — N2581 Secondary hyperparathyroidism of renal origin: Secondary | ICD-10-CM | POA: Diagnosis not present

## 2016-12-24 DIAGNOSIS — D631 Anemia in chronic kidney disease: Secondary | ICD-10-CM | POA: Diagnosis not present

## 2016-12-24 DIAGNOSIS — N186 End stage renal disease: Secondary | ICD-10-CM | POA: Diagnosis not present

## 2016-12-24 DIAGNOSIS — Z23 Encounter for immunization: Secondary | ICD-10-CM | POA: Diagnosis not present

## 2016-12-24 DIAGNOSIS — R7881 Bacteremia: Secondary | ICD-10-CM | POA: Diagnosis not present

## 2016-12-26 DIAGNOSIS — E1129 Type 2 diabetes mellitus with other diabetic kidney complication: Secondary | ICD-10-CM | POA: Diagnosis not present

## 2016-12-26 DIAGNOSIS — N186 End stage renal disease: Secondary | ICD-10-CM | POA: Diagnosis not present

## 2016-12-26 DIAGNOSIS — Z992 Dependence on renal dialysis: Secondary | ICD-10-CM | POA: Diagnosis not present

## 2016-12-27 ENCOUNTER — Other Ambulatory Visit: Payer: Self-pay | Admitting: Licensed Clinical Social Worker

## 2016-12-27 DIAGNOSIS — N2581 Secondary hyperparathyroidism of renal origin: Secondary | ICD-10-CM | POA: Diagnosis not present

## 2016-12-27 DIAGNOSIS — D631 Anemia in chronic kidney disease: Secondary | ICD-10-CM | POA: Diagnosis not present

## 2016-12-27 DIAGNOSIS — N186 End stage renal disease: Secondary | ICD-10-CM | POA: Diagnosis not present

## 2016-12-27 DIAGNOSIS — E1129 Type 2 diabetes mellitus with other diabetic kidney complication: Secondary | ICD-10-CM | POA: Diagnosis not present

## 2016-12-27 NOTE — Patient Outreach (Signed)
[place] [facility]) Care Management  12/27/2016  Alexander [name] Aug 21, 1940 [id number]  Assessment- CSW has not received a return call back from SNF discharge planner Halina Andreas. CSW completed additional attempt on 12/27/16 but was not successful in reaching her. CSW left an additional voice message requesting a return call with expected discharge plan updates.  Plan-CSW will continue to provide social work assistance and support and will await for return call from SNF. CSW will also follow up within one week.  [name], BSW, MSW, [email or web address] Phone: [phone] Fax: [phone]

## 2016-12-28 DIAGNOSIS — F3340 Major depressive disorder, recurrent, in remission, unspecified: Secondary | ICD-10-CM | POA: Diagnosis not present

## 2016-12-28 DIAGNOSIS — M545 Low back pain: Secondary | ICD-10-CM | POA: Diagnosis not present

## 2016-12-29 DIAGNOSIS — D631 Anemia in chronic kidney disease: Secondary | ICD-10-CM | POA: Diagnosis not present

## 2016-12-29 DIAGNOSIS — E1129 Type 2 diabetes mellitus with other diabetic kidney complication: Secondary | ICD-10-CM | POA: Diagnosis not present

## 2016-12-29 DIAGNOSIS — N186 End stage renal disease: Secondary | ICD-10-CM | POA: Diagnosis not present

## 2016-12-29 DIAGNOSIS — N2581 Secondary hyperparathyroidism of renal origin: Secondary | ICD-10-CM | POA: Diagnosis not present

## 2016-12-30 ENCOUNTER — Ambulatory Visit (INDEPENDENT_AMBULATORY_CARE_PROVIDER_SITE_OTHER): Payer: Medicare Other | Admitting: Pulmonary Disease

## 2016-12-30 ENCOUNTER — Other Ambulatory Visit: Payer: Self-pay | Admitting: Licensed Clinical Social Worker

## 2016-12-30 ENCOUNTER — Encounter: Payer: Self-pay | Admitting: Pulmonary Disease

## 2016-12-30 DIAGNOSIS — J849 Interstitial pulmonary disease, unspecified: Secondary | ICD-10-CM

## 2016-12-30 DIAGNOSIS — Z992 Dependence on renal dialysis: Secondary | ICD-10-CM | POA: Diagnosis not present

## 2016-12-30 DIAGNOSIS — I509 Heart failure, unspecified: Secondary | ICD-10-CM | POA: Diagnosis not present

## 2016-12-30 DIAGNOSIS — N186 End stage renal disease: Secondary | ICD-10-CM | POA: Diagnosis not present

## 2016-12-30 DIAGNOSIS — H3530 Unspecified macular degeneration: Secondary | ICD-10-CM | POA: Diagnosis not present

## 2016-12-30 DIAGNOSIS — F329 Major depressive disorder, single episode, unspecified: Secondary | ICD-10-CM | POA: Diagnosis not present

## 2016-12-30 DIAGNOSIS — I4891 Unspecified atrial fibrillation: Secondary | ICD-10-CM | POA: Diagnosis not present

## 2016-12-30 MED ORDER — TIOTROPIUM BROMIDE-OLODATEROL 2.5-2.5 MCG/ACT IN AERS: 2.0000 | INHALATION_SPRAY | Freq: Every day | RESPIRATORY_TRACT | 5 refills | Status: DC

## 2016-12-30 MED ORDER — TIOTROPIUM BROMIDE-OLODATEROL 2.5-2.5 MCG/ACT IN AERS: 2.0000 | INHALATION_SPRAY | Freq: Every day | RESPIRATORY_TRACT | 0 refills | Status: DC

## 2016-12-30 NOTE — Patient Outreach (Signed)
[place] [facility]) Care Management  12/30/2016  Alexander [name] [birth year] [id number]  Assessment- CSW received return voice message from [facility]. She reports that patient discharged from facility today to [facility] which is an ALF. CSW completed outreach call to family but was not able to reach anyone. HIPPA compliant voice message was left encouraging a return call with updates.  Plan-CSW will provide [facility] RNCM with SNF discharge update and will await to hear back from family.  [name], BSW, MSW, [email or web address] Phone: [phone] Fax: [phone]

## 2016-12-30 NOTE — Patient Outreach (Signed)
[place] [facility]) Care Management  12/30/2016  Alexander [name] 1940/04/27 [id number]  Assessment- CSW completed outreach call to [facility] SNF to gain updates on patient. CSW confirmed with front desk that patient is still at facility. CSW was then transferred to SNF social worker's line but CSW was unable to reach her. CSW left a HIPPA compliant voice message encouraging a return call with expected discharge updates.  Plan-CSW will await to hear back from facility and continue to provide social work assistance.  [name], BSW, MSW, [email or web address] Phone: [phone] Fax: [phone]

## 2016-12-30 NOTE — Patient Instructions (Signed)
We'll try to get records from the rheumatologist and Dr. Shelia Media regarding your arthritis I have reviewed your PFTs and lung imaging. There is mild emphysema. He may benefit from addition of an inhaler We'll try you stiolto once a day I'll get a high-resolution CT of the chest for further evaluation of the scarring at the base of the lung  Return to clinic and 4-8 weeks for reevaluation.

## 2016-12-30 NOTE — Progress Notes (Addendum)
Alexander [name]    [id number]    April 22, 1940  Primary Care Physician:Pharr, Thayer Jew, MD  Referring Physician: Deland Pretty, MD [address] [place], [place] [address]  Chief complaint:  Consult for evaluation of dyspnea  HPI: 76 year old with multiple medical issues including anemia, aortic stenosis status post TAVR, Barrett's esophagus, carpal tunnel syndrome, proximal atrial fibrillation, CVA, type 2 diabetes, end-stage renal disease, legal blindness due to macular degeneration. Here for evaluation of dyspnea over the past 6-8 months. He reports symptoms with activity and at rest, productive cough, no wheezing, fevers or chills  History noted for TAVR procedure in June 2018. The procedure improved his breathing temporarily but is now back to baseline. He had an admission after the procedure for volume overload requiring extra dialysis. Notes from [name], cardiologist mentioned elevated BNP and difficulty keeping his dry weight down with the dialysis as an outpatient. He was also admitted in the end of August 2018 for streptococcal bacteremia. TEE at that time did not show any evidence of endocarditis. He had been briefly on amiodarone from December 2016 to November of 2017 for paroxysmal atrial fibrillation. He is not on any nodal agents secondary to bradycardia. More recently he developed arthritis symptoms and is on low-dose prednisone at 5 mg for few months. He is unable to tell me what kind of arthritis he has.  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 No mold at home. Smoking history: 3 packs per day for 45 years. Quit in 1999 Travel History: Not significant  Outpatient Encounter Prescriptions as of 12/30/2016  Medication Sig  . acetaminophen (TYLENOL) 325 MG tablet Take 2 tablets (650 mg total) by mouth every 6 (six) hours as needed for mild pain.  [name]  atorvastatin (LIPITOR) 40 MG tablet Take 20 mg by mouth daily.  [name] b complex-vitamin c-folic acid (NEPHRO-VITE) 0.8 MG TABS Take 1 tablet by mouth daily.   . bromocriptine (PARLODEL) 5 MG capsule Take 5 mg by mouth at bedtime.   . calcium acetate (PHOSLO) 667 MG capsule Take 667 mg by mouth 4 (four) times daily.   . cetirizine (ZYRTEC) 10 MG tablet Take 10 mg by mouth at bedtime.   . cinacalcet (SENSIPAR) 30 MG tablet Take 30 mg by mouth every evening.   . diltiazem (CARDIZEM) 30 MG tablet Take 1 tablet (30 mg total) by mouth every 12 (twelve) hours.  . dorzolamide-timolol (COSOPT) 22.3-6.8 MG/ML ophthalmic solution Place 1 drop into the right eye 2 (two) times daily.  . Menthol (ICY HOT) 5 % PTCH Apply topically. ON IN THE AM AND OFF IN THE PM FOR 10 DAYS  . methocarbamol (ROBAXIN) 500 MG tablet Take 1 tablet (500 mg total) by mouth every 6 (six) hours as needed for muscle spasms.  . metoprolol tartrate (LOPRESSOR) 50 MG tablet Take 1 tablet (50 mg total) by mouth 2 (two) times daily.  . midodrine (PROAMATINE) 10 MG tablet Take 10 mg by mouth as directed. TAKE 1 TABLET THREE TIMES A DAY ON  Monday, Wednesday, AND Friday AFTER DIALYSIS AS NEEDED FOR LOW BLOOD PRESSURE  . omeprazole (PRILOSEC) 20 MG capsule Take 20 mg by mouth daily.  [name] oxyCODONE-acetaminophen (ROXICET) 5-325 MG tablet Take 1 tablet by mouth every 8 (eight) hours as needed for severe pain.  . polyethylene glycol (MIRALAX / GLYCOLAX) packet Take 17 g by mouth daily.  . predniSONE (DELTASONE)  5 MG tablet Take 5 mg by mouth daily.   [name] senna-docusate (SENOKOT-S) 8.6-50 MG tablet Take 1 tablet by mouth at bedtime.  . sertraline (ZOLOFT) 50 MG tablet Take 50 mg by mouth daily.  [name] warfarin (COUMADIN) 5 MG tablet Take 1-1.5 tablets by mouth daily as directed by coumadin clinic (Patient taking differently: Take 5-7.5 mg by mouth See admin instructions. Take 1-1.5 tablets by mouth daily as directed by coumadin clinic  Current dose is 5 mg (1  tablet) daily.)  . [DISCONTINUED] acetaminophen (TYLENOL) 500 MG tablet Take 500 mg by mouth 2 (two) times daily.   No facility-administered encounter medications on file as of 12/30/2016.     Allergies as of 12/30/2016 - Review Complete 12/30/2016  Allergen Reaction Noted  . Penicillins Swelling 01/21/2011  . Codeine Nausea Only 06/27/2014  . Tramadol Nausea Only 06/27/2014    Past Medical History:  Diagnosis Date  . Anemia   . Aortic stenosis 06/15/12   TEE - EF 95-18%; grade 1 diastolic dysfunction; mild/mod aortic valve stenosis; Mitral valve had calcified annulus, mild pulm htn PA peak pressure 49mmHg  . Barrett's esophagus 05/2003  . Bradycardia 2017   [employer] 2240 Assurity dual-lead pacemaker  . Carpal tunnel syndrome, bilateral 11/03/2015  . Colon polyps   . CVA (cerebral infarction)    2004/affected left side  . Depression   . Diabetes mellitus without complication ([place])   . Diabetic peripheral neuropathy ([place]) 10/02/2015  . Diverticulosis   . Dyspnea    with exertion  . End stage renal disease ([place])    hemodialysis 3 times a week  . GERD (gastroesophageal reflux disease)   . Glaucoma   . Hyperlipidemia   . Hypertension   . Kidney stones   . Legally blind   . Macular degeneration    both eyes  . Orthostatic hypotension 09/09/2015  . Paroxysmal atrial fibrillation ([facility])   . Peptic ulcer    bleeding, 1969  . Presence of permanent cardiac pacemaker   . S/P epidural steroid injection    last  injection over 10 years ago  . Seasonal allergies   . Tubular adenoma of colon 07/2001    Past Surgical History:  Procedure Laterality Date  . AV FISTULA PLACEMENT  2009  . BACK SURGERY    . CARDIOVERSION N/A 11/13/2015   Procedure: CARDIOVERSION;  Surgeon: Troy Sine, MD;  Location: [facility] ENDOSCOPY;  Service: Cardiovascular;  Laterality: N/A;  . CARDIOVERSION N/A 01/13/2016   Procedure: CARDIOVERSION;  Surgeon: Will Meredith [name], MD;  Location: [place];   Service: Cardiovascular;  Laterality: N/A;  . [place]   right eye  . CYSTOSCOPY  several times   kidney stones  . EP IMPLANTABLE DEVICE N/A 03/11/2015   Procedure: Pacemaker Implant;  Surgeon: Will Meredith [name], MD;  [place];  Laterality: Left  . LAMINECTOMY  1969  . RIGHT/LEFT HEART CATH AND CORONARY ANGIOGRAPHY N/A 08/20/2016   Procedure: Right/Left Heart Cath and Coronary Angiography;  Surgeon: Burnell Blanks, MD;  Location: [place] CV LAB;  Service: Cardiovascular;  Laterality: N/A;  . TEE WITHOUT CARDIOVERSION N/A 07/22/2016   Procedure: TRANSESOPHAGEAL ECHOCARDIOGRAM (TEE);  Surgeon: Skeet Latch, MD;  Location: [place];  Service: Cardiovascular;  Laterality: N/A;  . TEE WITHOUT CARDIOVERSION N/A 08/31/2016   Procedure: TRANSESOPHAGEAL ECHOCARDIOGRAM (TEE);  Surgeon: Burnell Blanks, MD;  Location: [facility];  Service: Open Heart Surgery;  Laterality: N/A;  . TEE WITHOUT  CARDIOVERSION N/A 12/01/2016   Procedure: TRANSESOPHAGEAL ECHOCARDIOGRAM (TEE);  Surgeon: Larey Dresser, MD;  Location: [facility] ENDOSCOPY;  Service: Cardiovascular;  Laterality: N/A;  . TONSILLECTOMY  1964  . TRANSCATHETER AORTIC VALVE REPLACEMENT, TRANSFEMORAL N/A 08/31/2016   Procedure: TRANSCATHETER AORTIC VALVE REPLACEMENT, TRANSFEMORAL;  Surgeon: Burnell Blanks, MD;  Location: [place];  Service: Open Heart Surgery;  Laterality: N/A;    Family History  Problem Relation Age of Onset  . Stomach cancer Mother   . Hypertension Father        Died of heart attack  . Heart attack Father   . Stroke Sister   . Heart disease Sister   . Cancer Brother   . Colon cancer Neg Hx     Social History   Social History  . Marital status: Married    Spouse name: N/A  . Number of children: 3  . Years of education: 14   Occupational History  . 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 Smoking status: Former Smoker    Packs/day: 2.00      Years: 45.00    Types: Cigarettes    Quit date: 01/20/1998  . Smokeless tobacco: Never Used  . Alcohol use No  . Drug use: No  . Sexual activity: Yes   Other Topics Concern  . Not on file   Social History Narrative   Lives at home w/ his wife   Right-handed   Drinks 1 cup of coffee per day    Review of systems: Review of Systems  Constitutional: Negative for fever and chills.  HENT: Negative.   Eyes: Negative for blurred vision.  Respiratory: as per HPI  Cardiovascular: Negative for chest pain and palpitations.  Gastrointestinal: Negative for vomiting, diarrhea, blood per rectum. Genitourinary: Negative for dysuria, urgency, frequency and hematuria.  Musculoskeletal: Negative for myalgias, back pain and joint pain.  Skin: Negative for itching and rash.  Neurological: Negative for dizziness, tremors, focal weakness, seizures and loss of consciousness.  Endo/Heme/Allergies: Negative for environmental allergies.  Psychiatric/Behavioral: Negative for depression, suicidal ideas and hallucinations.  All other systems reviewed and are negative.  Physical Exam: Blood pressure 124/76, pulse 70, height 5' 10.5" (1.791 m), weight 86.7 kg (191 lb 3.2 oz), SpO2 98 %. Gen:      No acute distress HEENT:  EOMI, sclera anicteric Neck:     No masses; no thyromegaly Lungs:    Clear to auscultation bilaterally; normal respiratory effort CV:         Regular rate and rhythm; no murmurs Abd:      + bowel sounds; soft, non-tender; no palpable masses, no distension Ext:    No edema; adequate peripheral perfusion Skin:      Warm and dry; no rash Neuro: alert and oriented x 3 Psych: normal mood and affect  Data Reviewed: CT chest 11/23/16-left apical bulla, emphysematous changes, reticular opacities/atelectasis predominantly at the lung bases. CT chest 08/26/16- left apical bulla, emphysematous changes. basal subpleural opacities. CT chest 10/05/15-left apical bulla emphysematous changes,  reticular opacities at the lung bases CT chest 04/03/14-left apical bulla, emphysematous changes, reticular opacities at the lung bases CT abdomen pelvis 01/28/11-reticular opacities, atelectasis at the lung bases CT abdomen pelvis 05/23/03-clear lung bases. I reviewed all images personally.  PFTs  03/14/14 FVC 4.28 (99%], FEV1 3.10 (99%], TLC 91%, DLCO 84% Minimal obstruction.  08/24/16  FVC 3.28 [78%], FEV1 2.42 (79%],  F/F 74, TLC 76%, DLCO 63% Minimal obstruction, minimal restriction with mild  diffusion defect.  Assessment:  Evaluation for dyspnea Dyspnea may be from a combination of his cardiac issues, volume overload from end-stage renal disease and COPD His lung imaging and PFTs show mild emphysema from significant smoking hisotry. He may benefit from addition of an inhaler. I'll try him on stiolto.  He also has basal atelectasis and possible scarring which is progressive from at least 2012. This could be from prior amiodarone use, IPF or connective tissue disease. We will get more details about his arthritis from his rheumatologist and primary care office. In the meantime will get a high-resolution CT for better evaluation of his lung parenchyma.  Follow-up in 4 weeks to reevaluate.  Plan/Recommendations: - Stiolto inhaler - High res CT - Obtain records from her rheumatologist and primary care regarding arthritis.   More then 1/2 the time of the 40 min visit was spent in counseling and/or coordination of care with the patient and family.  Marshell Garfinkel MD [facility] Pager [phone] 12/30/2016, 10:10 AM  CC: Deland Pretty, MD   Addendum: Received records from Dr. Shelia Media, primary care physician and Dr. Lahoma Rocker, Rheumatology  Notes show diagnosis of pseudogout involving multiple joints He has crystal proven pulmonary particular pseudogout involving the right knee and bilateral ankle.   Right knee arthrocentesis 11/18/25-cell count 374, positive  for CPPD crystal, culture negative Sed rate 62, uric acid 3.3, rheumatoid factor negative, ANA negative, CCP antibody negative Right knee chest x-ray with chondrocalcinosis and osteoarthritis.  He has been evaluated on 09/13/15 by Dr. Dossie Der, Rheumatology 00.for hand pain which was felt more to be neuropathic, likely related to carpal tunnel syndrome versus uremia.  So he was referred to neurologist. He had a nerve conduction study which showed combination of neuropathy and carpal tunnel.  He is on chronic prednisone 5 mg since September 2017  with good control of his pseudogout.  Attempts to taper prednisone have resulted in flares in the past He is being followed with bone scans and vitamin D supplementation.  Marshell Garfinkel MD [facility] Pager [phone] If no answer or after 3pm call: [phone] 01/25/2017, 11:24 AM

## 2016-12-31 ENCOUNTER — Telehealth: Payer: Self-pay | Admitting: Pulmonary Disease

## 2016-12-31 ENCOUNTER — Telehealth: Payer: Self-pay

## 2016-12-31 DIAGNOSIS — Z992 Dependence on renal dialysis: Secondary | ICD-10-CM | POA: Diagnosis not present

## 2016-12-31 DIAGNOSIS — H548 Legal blindness, as defined in USA: Secondary | ICD-10-CM | POA: Diagnosis not present

## 2016-12-31 DIAGNOSIS — N2581 Secondary hyperparathyroidism of renal origin: Secondary | ICD-10-CM | POA: Diagnosis not present

## 2016-12-31 DIAGNOSIS — M6281 Muscle weakness (generalized): Secondary | ICD-10-CM | POA: Diagnosis not present

## 2016-12-31 DIAGNOSIS — E1129 Type 2 diabetes mellitus with other diabetic kidney complication: Secondary | ICD-10-CM | POA: Diagnosis not present

## 2016-12-31 DIAGNOSIS — Z79891 Long term (current) use of opiate analgesic: Secondary | ICD-10-CM | POA: Diagnosis not present

## 2016-12-31 DIAGNOSIS — E1151 Type 2 diabetes mellitus with diabetic peripheral angiopathy without gangrene: Secondary | ICD-10-CM | POA: Diagnosis not present

## 2016-12-31 DIAGNOSIS — F329 Major depressive disorder, single episode, unspecified: Secondary | ICD-10-CM | POA: Diagnosis not present

## 2016-12-31 DIAGNOSIS — Z8673 Personal history of transient ischemic attack (TIA), and cerebral infarction without residual deficits: Secondary | ICD-10-CM | POA: Diagnosis not present

## 2016-12-31 DIAGNOSIS — Z993 Dependence on wheelchair: Secondary | ICD-10-CM | POA: Diagnosis not present

## 2016-12-31 DIAGNOSIS — R2681 Unsteadiness on feet: Secondary | ICD-10-CM | POA: Diagnosis not present

## 2016-12-31 DIAGNOSIS — D631 Anemia in chronic kidney disease: Secondary | ICD-10-CM | POA: Diagnosis not present

## 2016-12-31 DIAGNOSIS — Z7409 Other reduced mobility: Secondary | ICD-10-CM | POA: Diagnosis not present

## 2016-12-31 DIAGNOSIS — Z950 Presence of cardiac pacemaker: Secondary | ICD-10-CM | POA: Diagnosis not present

## 2016-12-31 DIAGNOSIS — H3530 Unspecified macular degeneration: Secondary | ICD-10-CM | POA: Diagnosis not present

## 2016-12-31 DIAGNOSIS — R41841 Cognitive communication deficit: Secondary | ICD-10-CM | POA: Diagnosis not present

## 2016-12-31 DIAGNOSIS — I480 Paroxysmal atrial fibrillation: Secondary | ICD-10-CM | POA: Diagnosis not present

## 2016-12-31 DIAGNOSIS — Z7901 Long term (current) use of anticoagulants: Secondary | ICD-10-CM | POA: Diagnosis not present

## 2016-12-31 DIAGNOSIS — E1142 Type 2 diabetes mellitus with diabetic polyneuropathy: Secondary | ICD-10-CM | POA: Diagnosis not present

## 2016-12-31 DIAGNOSIS — N186 End stage renal disease: Secondary | ICD-10-CM | POA: Diagnosis not present

## 2016-12-31 DIAGNOSIS — E1122 Type 2 diabetes mellitus with diabetic chronic kidney disease: Secondary | ICD-10-CM | POA: Diagnosis not present

## 2016-12-31 DIAGNOSIS — H409 Unspecified glaucoma: Secondary | ICD-10-CM | POA: Diagnosis not present

## 2016-12-31 MED ORDER — TIOTROPIUM BROMIDE-OLODATEROL 2.5-2.5 MCG/ACT IN AERS: 2.0000 | INHALATION_SPRAY | Freq: Every day | RESPIRATORY_TRACT | 3 refills | Status: AC

## 2016-12-31 NOTE — Telephone Encounter (Signed)
Shawna returned Tamara's call . [phone]

## 2016-12-31 NOTE — Telephone Encounter (Signed)
Left message for [facility] to call back to discuss pt.

## 2016-12-31 NOTE — Telephone Encounter (Signed)
Medical release form has been sent to [facility] requesting records from Dr. Shelia Media and Dr. Kathlene November. Received successful fax confirmation. Will await records.

## 2016-12-31 NOTE — Telephone Encounter (Signed)
Spoke with [facility] and advised Rx will be faxed to them for their orders so they can administer to pt at the assisted living. Nothing further is needed.

## 2017-01-02 DIAGNOSIS — M6281 Muscle weakness (generalized): Secondary | ICD-10-CM | POA: Diagnosis not present

## 2017-01-02 DIAGNOSIS — D631 Anemia in chronic kidney disease: Secondary | ICD-10-CM | POA: Diagnosis not present

## 2017-01-02 DIAGNOSIS — E1122 Type 2 diabetes mellitus with diabetic chronic kidney disease: Secondary | ICD-10-CM | POA: Diagnosis not present

## 2017-01-02 DIAGNOSIS — F329 Major depressive disorder, single episode, unspecified: Secondary | ICD-10-CM | POA: Diagnosis not present

## 2017-01-02 DIAGNOSIS — N186 End stage renal disease: Secondary | ICD-10-CM | POA: Diagnosis not present

## 2017-01-02 DIAGNOSIS — R2681 Unsteadiness on feet: Secondary | ICD-10-CM | POA: Diagnosis not present

## 2017-01-03 ENCOUNTER — Other Ambulatory Visit: Payer: Self-pay | Admitting: Licensed Clinical Social Worker

## 2017-01-03 DIAGNOSIS — E1129 Type 2 diabetes mellitus with other diabetic kidney complication: Secondary | ICD-10-CM | POA: Diagnosis not present

## 2017-01-03 DIAGNOSIS — N186 End stage renal disease: Secondary | ICD-10-CM | POA: Diagnosis not present

## 2017-01-03 DIAGNOSIS — D631 Anemia in chronic kidney disease: Secondary | ICD-10-CM | POA: Diagnosis not present

## 2017-01-03 DIAGNOSIS — N2581 Secondary hyperparathyroidism of renal origin: Secondary | ICD-10-CM | POA: Diagnosis not present

## 2017-01-03 NOTE — Patient Outreach (Signed)
[facility]) Care Management  01/03/2017  BYARD CARRANZA 1940-11-14 [id number]  Assessment- CSW completed call to [facility] ALF and spoke to front desk. CSW was informed that patient is there and that she will let clinical staff know that tomorrow CSW plans to visit patient and provide social work assistance.  Plan-CSW will continue to provide social work assistance and support and assist with SNF discharge.  [name], BSW, MSW, [email or web address] Phone: [phone] Fax: [phone]

## 2017-01-04 ENCOUNTER — Ambulatory Visit (INDEPENDENT_AMBULATORY_CARE_PROVIDER_SITE_OTHER)
Admission: RE | Admit: 2017-01-04 | Discharge: 2017-01-04 | Disposition: A | Discharge status: Home / Self Care | Payer: Medicare Other | Source: Ambulatory Visit | Attending: Pulmonary Disease | Admitting: Pulmonary Disease

## 2017-01-04 ENCOUNTER — Other Ambulatory Visit: Payer: Self-pay | Admitting: Licensed Clinical Social Worker

## 2017-01-04 ENCOUNTER — Ambulatory Visit (INDEPENDENT_AMBULATORY_CARE_PROVIDER_SITE_OTHER): Payer: Medicare Other | Admitting: Pharmacist Clinician (PhC)/ Clinical Pharmacy Specialist

## 2017-01-04 DIAGNOSIS — D631 Anemia in chronic kidney disease: Secondary | ICD-10-CM | POA: Diagnosis not present

## 2017-01-04 DIAGNOSIS — Z7901 Long term (current) use of anticoagulants: Secondary | ICD-10-CM | POA: Diagnosis not present

## 2017-01-04 DIAGNOSIS — I4891 Unspecified atrial fibrillation: Secondary | ICD-10-CM | POA: Diagnosis not present

## 2017-01-04 DIAGNOSIS — F329 Major depressive disorder, single episode, unspecified: Secondary | ICD-10-CM | POA: Diagnosis not present

## 2017-01-04 DIAGNOSIS — J849 Interstitial pulmonary disease, unspecified: Secondary | ICD-10-CM

## 2017-01-04 DIAGNOSIS — J439 Emphysema, unspecified: Secondary | ICD-10-CM | POA: Diagnosis not present

## 2017-01-04 DIAGNOSIS — E1122 Type 2 diabetes mellitus with diabetic chronic kidney disease: Secondary | ICD-10-CM | POA: Diagnosis not present

## 2017-01-04 DIAGNOSIS — N186 End stage renal disease: Secondary | ICD-10-CM | POA: Diagnosis not present

## 2017-01-04 DIAGNOSIS — M6281 Muscle weakness (generalized): Secondary | ICD-10-CM | POA: Diagnosis not present

## 2017-01-04 DIAGNOSIS — R2681 Unsteadiness on feet: Secondary | ICD-10-CM | POA: Diagnosis not present

## 2017-01-04 LAB — POCT INR: INR: 1.4

## 2017-01-04 NOTE — Patient Outreach (Addendum)
[facility]) Care Management  [facility] Social Work  01/04/2017  Alexander [name] [birth year] [id number]  Encounter Medications:  Outpatient Encounter Prescriptions as of 01/04/2017  Medication Sig Note  . acetaminophen (TYLENOL) 325 MG tablet Take 2 tablets (650 mg total) by mouth every 6 (six) hours as needed for mild pain.   [name] atorvastatin (LIPITOR) 40 MG tablet Take 20 mg by mouth daily.   [name] b complex-vitamin c-folic acid (NEPHRO-VITE) 0.8 MG TABS Take 1 tablet by mouth daily.    . bromocriptine (PARLODEL) 5 MG capsule Take 5 mg by mouth at bedtime.    . calcium acetate (PHOSLO) 667 MG capsule Take 667 mg by mouth 4 (four) times daily.    . cetirizine (ZYRTEC) 10 MG tablet Take 10 mg by mouth at bedtime.    . cinacalcet (SENSIPAR) 30 MG tablet Take 30 mg by mouth every evening.    . diltiazem (CARDIZEM) 30 MG tablet Take 1 tablet (30 mg total) by mouth every 12 (twelve) hours.   . dorzolamide-timolol (COSOPT) 22.3-6.8 MG/ML ophthalmic solution Place 1 drop into the right eye 2 (two) times daily.   . Menthol (ICY HOT) 5 % PTCH Apply topically. ON IN THE AM AND OFF IN THE PM FOR 10 DAYS   . methocarbamol (ROBAXIN) 500 MG tablet Take 1 tablet (500 mg total) by mouth every 6 (six) hours as needed for muscle spasms.   . metoprolol tartrate (LOPRESSOR) 50 MG tablet Take 1 tablet (50 mg total) by mouth 2 (two) times daily.   . midodrine (PROAMATINE) 10 MG tablet Take 10 mg by mouth as directed. TAKE 1 TABLET THREE TIMES A DAY ON  Monday, Wednesday, AND Friday AFTER DIALYSIS AS NEEDED FOR LOW BLOOD PRESSURE   . omeprazole (PRILOSEC) 20 MG capsule Take 20 mg by mouth daily.   [name] oxyCODONE-acetaminophen (ROXICET) 5-325 MG tablet Take 1 tablet by mouth every 8 (eight) hours as needed for severe pain.   . polyethylene glycol (MIRALAX / GLYCOLAX) packet Take 17 g by mouth daily.   . predniSONE (DELTASONE) 5 MG tablet Take 5 mg by mouth daily.    [name] senna-docusate (SENOKOT-S) 8.6-50 MG  tablet Take 1 tablet by mouth at bedtime.   . sertraline (ZOLOFT) 50 MG tablet Take 50 mg by mouth daily.   . Tiotropium Bromide-Olodaterol (STIOLTO RESPIMAT) 2.5-2.5 MCG/ACT AERS Inhale 2 puffs into the lungs daily.   [name] warfarin (COUMADIN) 5 MG tablet Take 1-1.5 tablets by mouth daily as directed by coumadin clinic (Patient taking differently: Take 5-7.5 mg by mouth See admin instructions. Take 1-1.5 tablets by mouth daily as directed by coumadin clinic  Current dose is 5 mg (1 tablet) daily.) 08/27/2016: Last dose was on 08/25/16 PM   No facility-administered encounter medications on file as of 01/04/2017.     Functional Status:  In your present state of health, do you have any difficulty performing the following activities: 11/24/2016 11/24/2016  Hearing? N N  Comment - -  Vision? Y -  Comment - -  Difficulty concentrating or making decisions? N -  Comment - -  Walking or climbing stairs? Y -  Comment - -  Dressing or bathing? N -  Comment - -  Doing errands, shopping? [name] and eating ? - -  Comment - -  Using the Toilet? - -  Comment - -  In the past six months, have you accidently leaked urine? - -  Comment - -  Do you have problems with loss of bowel control? - -  Comment - -  Managing your Medications? - -  Comment - -  Managing your Finances? - -  Comment - -  Housekeeping or managing your Housekeeping? - -  Comment - -  Some recent data might be hidden    Fall/Depression Screening:  PHQ 2/9 Scores 12/03/2016 10/26/2016 10/04/2016  PHQ - 2 Score 0 0 -  Exception Documentation - - Other- indicate reason in comment box  Not completed - - call completed with dtr    Assessment: CSW completed acute home visit at [facility] with patient and his family on 01/04/17. When CSW arrived, CSW was informed that [name], Health and Wellness Director would like to speak to [name]. CSW went to nurse's station and met with [name]. She reports that  patient has been adjusting to facility well. However, he was experiencing some dizziness and nausea last night from the dialysis they believe. She reports that family has decided for patient to stay at ALF for the next few months while wife undergoes cancer treatment. Patient's PT and OT has already started. CSW went to patient's room and patient's spouse was there visiting with him. CSW introduced self and reason for visit to spouse. Spouse reports that they are hopeful that patient can return back home within the next few months after ongoing therapy. Spouse reports that patient has been very tired and weak since his arrival to [name]. Patient had an appointment today at the coumadin clinic and they were concerned about his SOB and completed a pulmonary function test and that daughter [name] should be getting results back this afternoon. CSW reviewed personal care resources with family. Spouse reports that they will look more into these resources when patient returns back home. Spouse is concerned about patient isolating in his room throughout the day. CSW provided education on the importance of gaining socialization as well as being active. CSW reviewed event calendar with patient. Spouse also wished that patient would walk to the cafeteria once per day with his walker instead of his wheelchair. Patient reports that he is already doing this. Patient denies any issues with his appetite. He reports that he has been taking his shower and getting dressed on his own. Family is aware that they will have to make some adjustments to their own bathroom whenever patient returns home. Patient does have a shower chair and a hand held shower nozzle at his home but no grab bars. Spouse and daughter [name] continue to provide stable transportation for him to all medical appointments. Family has another daughter that is flying in out of town to visit patient this week.  THN CM Care Plan Problem One     Most Recent Value   Care Plan Problem One  SNF admission  Role Documenting the Problem One  Clinical Social Worker  Care Plan for Problem One  Active  [facility] Long Term Goal   Patient will have a safe and stable discharge back home from ALF within 90 days  [place] Term Goal Start Date  12/03/16  Interventions for Problem One Long Term Goal  Goal has been adjusted as patient's plan for discharge were changed to temporary stay at ALF until spouse finishes chemo treatment. CSW will support this goal and assist patient with all social work needs.  THN CM Short Term Goal #1   Family will gain personal care resources and will explore them within 30  days  THN CM Short Term Goal #1 Start Date  12/03/16  [facility] CM Short Term Goal #1 Met Date  01/04/17  Interventions for Short Term Goal #1  Goal was met. Resources were reviewed and provided. However, resources will not be needed until patient returns home.  THN CM Short Term Goal #2   Patient will increase socialization over the next 30 days  THN CM Short Term Goal #2 Start Date  01/04/17  Interventions for Short Term Goal #2  Spouse feels that patient is staying in his room throughout the day and would benefit by participating in the facility activities. CSW provided MI intervention to support this goal and educated patient on the importancce of increasing socialization.     Plan: CSW will route encounter to PCP and RNCM and follow up within three weeks.  [name], BSW, MSW, [email or web address] Phone: [phone] Fax: [phone]

## 2017-01-05 ENCOUNTER — Telehealth: Payer: Self-pay | Admitting: Pulmonary Disease

## 2017-01-05 DIAGNOSIS — E1129 Type 2 diabetes mellitus with other diabetic kidney complication: Secondary | ICD-10-CM | POA: Diagnosis not present

## 2017-01-05 DIAGNOSIS — N186 End stage renal disease: Secondary | ICD-10-CM | POA: Diagnosis not present

## 2017-01-05 DIAGNOSIS — D631 Anemia in chronic kidney disease: Secondary | ICD-10-CM | POA: Diagnosis not present

## 2017-01-05 DIAGNOSIS — N2581 Secondary hyperparathyroidism of renal origin: Secondary | ICD-10-CM | POA: Diagnosis not present

## 2017-01-05 NOTE — Telephone Encounter (Signed)
Notes recorded by Marshell Garfinkel, MD on 01/05/2017 at 4:25 PM EDT Please let the patient know that the CT does not show any lung scarring or interstitial lung disease. There is evidence of COPD. Ask him to continue on the new inhaler. We will review at next visit to see how he is doing on it. CT also shows evidence of coronary atherosclerosis and mild volume overload which is likely secondary to his heart issues. Ask him to follow with his cardiologist. Meliton Rattan with pt, advised Alexander [name] of results. Nothing further is needed.

## 2017-01-06 DIAGNOSIS — E1122 Type 2 diabetes mellitus with diabetic chronic kidney disease: Secondary | ICD-10-CM | POA: Diagnosis not present

## 2017-01-06 DIAGNOSIS — D631 Anemia in chronic kidney disease: Secondary | ICD-10-CM | POA: Diagnosis not present

## 2017-01-06 DIAGNOSIS — F329 Major depressive disorder, single episode, unspecified: Secondary | ICD-10-CM | POA: Diagnosis not present

## 2017-01-06 DIAGNOSIS — R2681 Unsteadiness on feet: Secondary | ICD-10-CM | POA: Diagnosis not present

## 2017-01-06 DIAGNOSIS — M6281 Muscle weakness (generalized): Secondary | ICD-10-CM | POA: Diagnosis not present

## 2017-01-06 DIAGNOSIS — N186 End stage renal disease: Secondary | ICD-10-CM | POA: Diagnosis not present

## 2017-01-07 DIAGNOSIS — D631 Anemia in chronic kidney disease: Secondary | ICD-10-CM | POA: Diagnosis not present

## 2017-01-07 DIAGNOSIS — N186 End stage renal disease: Secondary | ICD-10-CM | POA: Diagnosis not present

## 2017-01-07 DIAGNOSIS — E1129 Type 2 diabetes mellitus with other diabetic kidney complication: Secondary | ICD-10-CM | POA: Diagnosis not present

## 2017-01-07 DIAGNOSIS — N2581 Secondary hyperparathyroidism of renal origin: Secondary | ICD-10-CM | POA: Diagnosis not present

## 2017-01-10 DIAGNOSIS — E1129 Type 2 diabetes mellitus with other diabetic kidney complication: Secondary | ICD-10-CM | POA: Diagnosis not present

## 2017-01-10 DIAGNOSIS — N2581 Secondary hyperparathyroidism of renal origin: Secondary | ICD-10-CM | POA: Diagnosis not present

## 2017-01-10 DIAGNOSIS — N186 End stage renal disease: Secondary | ICD-10-CM | POA: Diagnosis not present

## 2017-01-10 DIAGNOSIS — D631 Anemia in chronic kidney disease: Secondary | ICD-10-CM | POA: Diagnosis not present

## 2017-01-11 DIAGNOSIS — M6281 Muscle weakness (generalized): Secondary | ICD-10-CM | POA: Diagnosis not present

## 2017-01-11 DIAGNOSIS — N186 End stage renal disease: Secondary | ICD-10-CM | POA: Diagnosis not present

## 2017-01-11 DIAGNOSIS — E1122 Type 2 diabetes mellitus with diabetic chronic kidney disease: Secondary | ICD-10-CM | POA: Diagnosis not present

## 2017-01-11 DIAGNOSIS — D631 Anemia in chronic kidney disease: Secondary | ICD-10-CM | POA: Diagnosis not present

## 2017-01-11 DIAGNOSIS — R2681 Unsteadiness on feet: Secondary | ICD-10-CM | POA: Diagnosis not present

## 2017-01-11 DIAGNOSIS — F329 Major depressive disorder, single episode, unspecified: Secondary | ICD-10-CM | POA: Diagnosis not present

## 2017-01-12 DIAGNOSIS — E1129 Type 2 diabetes mellitus with other diabetic kidney complication: Secondary | ICD-10-CM | POA: Diagnosis not present

## 2017-01-12 DIAGNOSIS — N2581 Secondary hyperparathyroidism of renal origin: Secondary | ICD-10-CM | POA: Diagnosis not present

## 2017-01-12 DIAGNOSIS — D631 Anemia in chronic kidney disease: Secondary | ICD-10-CM | POA: Diagnosis not present

## 2017-01-12 DIAGNOSIS — N186 End stage renal disease: Secondary | ICD-10-CM | POA: Diagnosis not present

## 2017-01-13 DIAGNOSIS — E1122 Type 2 diabetes mellitus with diabetic chronic kidney disease: Secondary | ICD-10-CM | POA: Diagnosis not present

## 2017-01-13 DIAGNOSIS — D631 Anemia in chronic kidney disease: Secondary | ICD-10-CM | POA: Diagnosis not present

## 2017-01-13 DIAGNOSIS — N186 End stage renal disease: Secondary | ICD-10-CM | POA: Diagnosis not present

## 2017-01-13 DIAGNOSIS — F329 Major depressive disorder, single episode, unspecified: Secondary | ICD-10-CM | POA: Diagnosis not present

## 2017-01-13 DIAGNOSIS — R2681 Unsteadiness on feet: Secondary | ICD-10-CM | POA: Diagnosis not present

## 2017-01-13 DIAGNOSIS — M6281 Muscle weakness (generalized): Secondary | ICD-10-CM | POA: Diagnosis not present

## 2017-01-14 ENCOUNTER — Other Ambulatory Visit: Payer: Self-pay | Admitting: Licensed Clinical Social Worker

## 2017-01-14 ENCOUNTER — Ambulatory Visit (INDEPENDENT_AMBULATORY_CARE_PROVIDER_SITE_OTHER): Payer: Medicare Other | Admitting: Pharmacist Clinician (PhC)/ Clinical Pharmacy Specialist

## 2017-01-14 DIAGNOSIS — Z7901 Long term (current) use of anticoagulants: Secondary | ICD-10-CM

## 2017-01-14 DIAGNOSIS — I4891 Unspecified atrial fibrillation: Secondary | ICD-10-CM

## 2017-01-14 DIAGNOSIS — D631 Anemia in chronic kidney disease: Secondary | ICD-10-CM | POA: Diagnosis not present

## 2017-01-14 DIAGNOSIS — N2581 Secondary hyperparathyroidism of renal origin: Secondary | ICD-10-CM | POA: Diagnosis not present

## 2017-01-14 DIAGNOSIS — N186 End stage renal disease: Secondary | ICD-10-CM | POA: Diagnosis not present

## 2017-01-14 DIAGNOSIS — E1129 Type 2 diabetes mellitus with other diabetic kidney complication: Secondary | ICD-10-CM | POA: Diagnosis not present

## 2017-01-14 LAB — POCT INR: INR: 5.1

## 2017-01-14 NOTE — Patient Outreach (Signed)
Encounter created by error.

## 2017-01-17 ENCOUNTER — Telehealth: Payer: Self-pay | Admitting: Pulmonary Disease

## 2017-01-17 DIAGNOSIS — D631 Anemia in chronic kidney disease: Secondary | ICD-10-CM | POA: Diagnosis not present

## 2017-01-17 DIAGNOSIS — N186 End stage renal disease: Secondary | ICD-10-CM | POA: Diagnosis not present

## 2017-01-17 DIAGNOSIS — N2581 Secondary hyperparathyroidism of renal origin: Secondary | ICD-10-CM | POA: Diagnosis not present

## 2017-01-17 DIAGNOSIS — E1129 Type 2 diabetes mellitus with other diabetic kidney complication: Secondary | ICD-10-CM | POA: Diagnosis not present

## 2017-01-17 NOTE — Telephone Encounter (Signed)
Spoke with daughter, letter from insurance doesn't want to pay for Robaxin but we did not prescribe this medication. She understood and will address this with the pharmacy. Nothing further is needed.

## 2017-01-17 NOTE — Telephone Encounter (Signed)
Rec'd from [facility] forwarded 102 pages to Dr. Marshell Garfinkel

## 2017-01-18 DIAGNOSIS — R2681 Unsteadiness on feet: Secondary | ICD-10-CM | POA: Diagnosis not present

## 2017-01-18 DIAGNOSIS — N186 End stage renal disease: Secondary | ICD-10-CM | POA: Diagnosis not present

## 2017-01-18 DIAGNOSIS — M6281 Muscle weakness (generalized): Secondary | ICD-10-CM | POA: Diagnosis not present

## 2017-01-18 DIAGNOSIS — E1122 Type 2 diabetes mellitus with diabetic chronic kidney disease: Secondary | ICD-10-CM | POA: Diagnosis not present

## 2017-01-18 DIAGNOSIS — F329 Major depressive disorder, single episode, unspecified: Secondary | ICD-10-CM | POA: Diagnosis not present

## 2017-01-18 DIAGNOSIS — D631 Anemia in chronic kidney disease: Secondary | ICD-10-CM | POA: Diagnosis not present

## 2017-01-18 NOTE — Telephone Encounter (Signed)
[facility] in regards to records, as we have not received these. It is currently after business hours for [facility], will try again on 01/19/17.

## 2017-01-19 DIAGNOSIS — I482 Chronic atrial fibrillation: Secondary | ICD-10-CM | POA: Diagnosis not present

## 2017-01-19 DIAGNOSIS — D631 Anemia in chronic kidney disease: Secondary | ICD-10-CM | POA: Diagnosis not present

## 2017-01-19 DIAGNOSIS — E1122 Type 2 diabetes mellitus with diabetic chronic kidney disease: Secondary | ICD-10-CM | POA: Diagnosis not present

## 2017-01-19 DIAGNOSIS — R2681 Unsteadiness on feet: Secondary | ICD-10-CM | POA: Diagnosis not present

## 2017-01-19 DIAGNOSIS — N186 End stage renal disease: Secondary | ICD-10-CM | POA: Diagnosis not present

## 2017-01-19 DIAGNOSIS — N2581 Secondary hyperparathyroidism of renal origin: Secondary | ICD-10-CM | POA: Diagnosis not present

## 2017-01-19 DIAGNOSIS — E1129 Type 2 diabetes mellitus with other diabetic kidney complication: Secondary | ICD-10-CM | POA: Diagnosis not present

## 2017-01-19 DIAGNOSIS — F329 Major depressive disorder, single episode, unspecified: Secondary | ICD-10-CM | POA: Diagnosis not present

## 2017-01-19 DIAGNOSIS — M6281 Muscle weakness (generalized): Secondary | ICD-10-CM | POA: Diagnosis not present

## 2017-01-19 NOTE — Telephone Encounter (Signed)
Records have been received and placed in PM's cubby for review.  Nothing further needed.  

## 2017-01-20 ENCOUNTER — Other Ambulatory Visit: Payer: Self-pay | Admitting: Licensed Clinical Social Worker

## 2017-01-20 DIAGNOSIS — N186 End stage renal disease: Secondary | ICD-10-CM | POA: Diagnosis not present

## 2017-01-20 DIAGNOSIS — E1122 Type 2 diabetes mellitus with diabetic chronic kidney disease: Secondary | ICD-10-CM | POA: Diagnosis not present

## 2017-01-20 DIAGNOSIS — M6281 Muscle weakness (generalized): Secondary | ICD-10-CM | POA: Diagnosis not present

## 2017-01-20 DIAGNOSIS — R2681 Unsteadiness on feet: Secondary | ICD-10-CM | POA: Diagnosis not present

## 2017-01-20 DIAGNOSIS — D631 Anemia in chronic kidney disease: Secondary | ICD-10-CM | POA: Diagnosis not present

## 2017-01-20 DIAGNOSIS — F329 Major depressive disorder, single episode, unspecified: Secondary | ICD-10-CM | POA: Diagnosis not present

## 2017-01-21 ENCOUNTER — Other Ambulatory Visit: Payer: Self-pay | Admitting: Licensed Clinical Social Worker

## 2017-01-21 DIAGNOSIS — D631 Anemia in chronic kidney disease: Secondary | ICD-10-CM | POA: Diagnosis not present

## 2017-01-21 DIAGNOSIS — E1129 Type 2 diabetes mellitus with other diabetic kidney complication: Secondary | ICD-10-CM | POA: Diagnosis not present

## 2017-01-21 DIAGNOSIS — N2581 Secondary hyperparathyroidism of renal origin: Secondary | ICD-10-CM | POA: Diagnosis not present

## 2017-01-21 DIAGNOSIS — N186 End stage renal disease: Secondary | ICD-10-CM | POA: Diagnosis not present

## 2017-01-21 NOTE — Patient Outreach (Signed)
[facility]) Care Management  01/21/2017  Alexander [name] [birth year] [id number]  Assessment-CSW completed outreach attempt today to patient's daughter Alexander [name]. CSW unable to reach daughter successfully. CSW left a HIPPA compliant voice message encouraging family to return call once available.  Plan-CSW will await return call or complete an additional outreach if needed.  Alexander [name], BSW, MSW, [name] Phone: [phone] Fax: [phone]

## 2017-01-24 ENCOUNTER — Other Ambulatory Visit: Payer: Self-pay | Admitting: Licensed Clinical Social Worker

## 2017-01-24 DIAGNOSIS — D631 Anemia in chronic kidney disease: Secondary | ICD-10-CM | POA: Diagnosis not present

## 2017-01-24 DIAGNOSIS — E1129 Type 2 diabetes mellitus with other diabetic kidney complication: Secondary | ICD-10-CM | POA: Diagnosis not present

## 2017-01-24 DIAGNOSIS — N2581 Secondary hyperparathyroidism of renal origin: Secondary | ICD-10-CM | POA: Diagnosis not present

## 2017-01-24 DIAGNOSIS — N186 End stage renal disease: Secondary | ICD-10-CM | POA: Diagnosis not present

## 2017-01-24 NOTE — Patient Outreach (Signed)
[place] [place] [facility]) Care Management  01/24/2017  Alexander [name] 1940-07-18 [id number]  Assessment-CSW completed outreach attempt today to patient's daughter in order to gain update on patient. CSW unable to reach family successfully. CSW left a HIPPA compliant voice message encouraging a return call once available.  Plan-CSW will await return call or complete an additional outreach if needed.  [name], BSW, MSW, [email or web address] Phone: [phone] Fax: [phone]

## 2017-01-25 ENCOUNTER — Other Ambulatory Visit: Payer: Self-pay | Admitting: Licensed Clinical Social Worker

## 2017-01-25 DIAGNOSIS — N186 End stage renal disease: Secondary | ICD-10-CM | POA: Diagnosis not present

## 2017-01-25 DIAGNOSIS — R2681 Unsteadiness on feet: Secondary | ICD-10-CM | POA: Diagnosis not present

## 2017-01-25 DIAGNOSIS — M6281 Muscle weakness (generalized): Secondary | ICD-10-CM | POA: Diagnosis not present

## 2017-01-25 DIAGNOSIS — E1122 Type 2 diabetes mellitus with diabetic chronic kidney disease: Secondary | ICD-10-CM | POA: Diagnosis not present

## 2017-01-25 DIAGNOSIS — F329 Major depressive disorder, single episode, unspecified: Secondary | ICD-10-CM | POA: Diagnosis not present

## 2017-01-25 DIAGNOSIS — D631 Anemia in chronic kidney disease: Secondary | ICD-10-CM | POA: Diagnosis not present

## 2017-01-25 NOTE — Patient Outreach (Addendum)
[facility]) Care Management  01/25/2017  PLEAS CARNEAL [birth year] [id number]  Assessment- CSW completed call to [place] [facility] [address] and spoke to clinical team. CSW was wondering if CSW could complete home visit tomorrow but was not sure if patient had any other appointments. CSW was informed that patient receives dialysis on Mondays, Wednesdays and Fridays and usually gets back at 12:00 but sometimes will go with family to get lunch afterwards. CSW was appreciative of update from ALF staff members.   Plan-CSW will complete a home visit on a day that patient does not have dialysis within one week.   [name], BSW, MSW, [email or web address] Phone: [phone] Fax: [phone]

## 2017-01-26 ENCOUNTER — Ambulatory Visit: Payer: Medicare Other | Admitting: Licensed Clinical Social Worker

## 2017-01-26 ENCOUNTER — Other Ambulatory Visit: Payer: Medicare Other | Admitting: Licensed Clinical Social Worker

## 2017-01-26 DIAGNOSIS — E1129 Type 2 diabetes mellitus with other diabetic kidney complication: Secondary | ICD-10-CM | POA: Diagnosis not present

## 2017-01-26 DIAGNOSIS — Z992 Dependence on renal dialysis: Secondary | ICD-10-CM | POA: Diagnosis not present

## 2017-01-26 DIAGNOSIS — N2581 Secondary hyperparathyroidism of renal origin: Secondary | ICD-10-CM | POA: Diagnosis not present

## 2017-01-26 DIAGNOSIS — N186 End stage renal disease: Secondary | ICD-10-CM | POA: Diagnosis not present

## 2017-01-26 DIAGNOSIS — D631 Anemia in chronic kidney disease: Secondary | ICD-10-CM | POA: Diagnosis not present

## 2017-01-27 ENCOUNTER — Other Ambulatory Visit: Payer: Self-pay | Admitting: Licensed Clinical Social Worker

## 2017-01-27 ENCOUNTER — Ambulatory Visit (INDEPENDENT_AMBULATORY_CARE_PROVIDER_SITE_OTHER): Payer: Medicare Other | Admitting: Pulmonary Disease

## 2017-01-27 ENCOUNTER — Encounter: Payer: Self-pay | Admitting: Pulmonary Disease

## 2017-01-27 VITALS — BP 124/76 | HR 102 | Ht 70.5 in | Wt 196.4 lb

## 2017-01-27 DIAGNOSIS — D631 Anemia in chronic kidney disease: Secondary | ICD-10-CM | POA: Diagnosis not present

## 2017-01-27 DIAGNOSIS — M6281 Muscle weakness (generalized): Secondary | ICD-10-CM | POA: Diagnosis not present

## 2017-01-27 DIAGNOSIS — R2681 Unsteadiness on feet: Secondary | ICD-10-CM | POA: Diagnosis not present

## 2017-01-27 DIAGNOSIS — J439 Emphysema, unspecified: Secondary | ICD-10-CM | POA: Diagnosis not present

## 2017-01-27 DIAGNOSIS — N186 End stage renal disease: Secondary | ICD-10-CM | POA: Diagnosis not present

## 2017-01-27 DIAGNOSIS — E1122 Type 2 diabetes mellitus with diabetic chronic kidney disease: Secondary | ICD-10-CM | POA: Diagnosis not present

## 2017-01-27 DIAGNOSIS — F329 Major depressive disorder, single episode, unspecified: Secondary | ICD-10-CM | POA: Diagnosis not present

## 2017-01-27 MED ORDER — TIOTROPIUM BROMIDE-OLODATEROL 2.5-2.5 MCG/ACT IN AERS: 2.0000 | INHALATION_SPRAY | Freq: Every day | RESPIRATORY_TRACT | 0 refills | Status: DC

## 2017-01-27 NOTE — Patient Outreach (Signed)
[place] [facility]) Care Management  [facility] Social Work  01/27/2017  Alexander [name] [birth year] [id number]  Encounter Medications:  Outpatient Encounter Prescriptions as of 01/27/2017  Medication Sig  . acetaminophen (TYLENOL) 325 MG tablet Take 2 tablets (650 mg total) by mouth every 6 (six) hours as needed for mild pain.  [name] atorvastatin (LIPITOR) 40 MG tablet Take 20 mg by mouth daily.  [name] b complex-vitamin c-folic acid (NEPHRO-VITE) 0.8 MG TABS Take 1 tablet by mouth daily.   . bromocriptine (PARLODEL) 5 MG capsule Take 5 mg by mouth at bedtime.   . calcium acetate (PHOSLO) 667 MG capsule Take 667 mg by mouth 4 (four) times daily.   . cetirizine (ZYRTEC) 10 MG tablet Take 10 mg by mouth at bedtime.   . cinacalcet (SENSIPAR) 30 MG tablet Take 30 mg by mouth every evening.   . diltiazem (CARDIZEM) 30 MG tablet Take 1 tablet (30 mg total) by mouth every 12 (twelve) hours.  . dorzolamide-timolol (COSOPT) 22.3-6.8 MG/ML ophthalmic solution Place 1 drop into the right eye 2 (two) times daily.  . Menthol (ICY HOT) 5 % PTCH Apply topically. ON IN THE AM AND OFF IN THE PM FOR 10 DAYS  . methocarbamol (ROBAXIN) 500 MG tablet Take 1 tablet (500 mg total) by mouth every 6 (six) hours as needed for muscle spasms.  . metoprolol tartrate (LOPRESSOR) 50 MG tablet Take 1 tablet (50 mg total) by mouth 2 (two) times daily.  . midodrine (PROAMATINE) 10 MG tablet Take 10 mg by mouth as directed. TAKE 1 TABLET THREE TIMES A DAY ON  Monday, Wednesday, AND Friday AFTER DIALYSIS AS NEEDED FOR LOW BLOOD PRESSURE  . omeprazole (PRILOSEC) 20 MG capsule Take 20 mg by mouth daily.  [name] oxyCODONE-acetaminophen (ROXICET) 5-325 MG tablet Take 1 tablet by mouth every 8 (eight) hours as needed for severe pain.  . polyethylene glycol (MIRALAX / GLYCOLAX) packet Take 17 g by mouth daily.  . predniSONE (DELTASONE) 5 MG tablet Take 5 mg by mouth daily.   [name] senna-docusate (SENOKOT-S) 8.6-50 MG tablet Take 1 tablet by  mouth at bedtime.  . sertraline (ZOLOFT) 50 MG tablet Take 50 mg by mouth daily.  . Tiotropium Bromide-Olodaterol (STIOLTO RESPIMAT) 2.5-2.5 MCG/ACT AERS Inhale 2 puffs into the lungs daily.  [name] warfarin (COUMADIN) 5 MG tablet Take 1-1.5 tablets by mouth daily as directed by coumadin clinic (Patient taking differently: Take 5-7.5 mg by mouth See admin instructions. Take 1-1.5 tablets by mouth daily as directed by coumadin clinic  Current dose is 5 mg (1 tablet) daily.)   No facility-administered encounter medications on file as of 01/27/2017.     Functional Status:  In your present state of health, do you have any difficulty performing the following activities: 11/24/2016 11/24/2016  Hearing? N N  Comment - -  Vision? Y -  Comment - -  Difficulty concentrating or making decisions? N -  Comment - -  Walking or climbing stairs? Y -  Comment - -  Dressing or bathing? N -  Comment - -  Doing errands, shopping? [place] and eating ? - -  Comment - -  Using the Toilet? - -  Comment - -  In the past six months, have you accidently leaked urine? - -  Comment - -  Do you have problems with loss of bowel control? - -  Comment - -  Managing your Medications? - -  Comment - -  Managing your Finances? - -  Comment - -  Housekeeping or managing your Housekeeping? - -  Comment - -  Some recent data might be hidden    Fall/Depression Screening:  PHQ 2/9 Scores 12/03/2016 10/26/2016 10/04/2016  PHQ - 2 Score 0 0 -  Exception Documentation - - Other- indicate reason in comment box  Not completed - - call completed with dtr    Assessment: CSW arrived at ALF to complete home visit but patient is not present at this time. He is still on his way back home from physician's appointment.  Plan: CSW will make another attempt to complete a home visit this month and continue to provide social work assistance. CSW has not received a return call back from patient's  daughter.  [name], BSW, MSW, [name] Phone: [phone] Fax: [phone]

## 2017-01-27 NOTE — Patient Instructions (Signed)
Please continue on the inhaler.  Will check with insurance company to see if this any similar inhaler is covered Follow-up in 3 months.

## 2017-01-27 NOTE — Progress Notes (Signed)
Alexander [name]    [id number]    1940/06/02  Primary Care Physician:Pharr, Thayer Jew, MD  Referring Physician: Deland Pretty, MD [address] [place], [place] [address]  Chief complaint:  Consult for evaluation of dyspnea  HPI: 76 year old with multiple medical issues including anemia, aortic stenosis status post TAVR, Barrett's esophagus, carpal tunnel syndrome, proximal atrial fibrillation, CVA, type 2 diabetes, end-stage renal disease, legal blindness due to macular degeneration. Here for evaluation of dyspnea over the past 6-8 months. He reports symptoms with activity and at rest, productive cough, no wheezing, fevers or chills  History noted for TAVR procedure in June 2018. The procedure improved his breathing temporarily but is now back to baseline. He had an admission after the procedure for volume overload requiring extra dialysis. Notes from [name], cardiologist mentioned elevated BNP and difficulty keeping his dry weight down with the dialysis as an outpatient. He was also admitted in the end of August 2018 for streptococcal bacteremia. TEE at that time did not show any evidence of endocarditis. He had been briefly on amiodarone from December 2016 to November of 2017 for paroxysmal atrial fibrillation. He is not on any nodal agents secondary to bradycardia.   More recently he developed arthritis symptoms and is on low-dose prednisone at 5 mg for few months.  Per review of the primary care office notes his arthritis is thought to be pseudogout. He has been evaluated on 09/13/15 by Dr. Dossie Der, Rheumatology .for hand pain which was felt more to be neuropathic, likely related to carpal tunnel syndrome versus uremia.  So he was referred to neurologist. He had a nerve conduction study which showed combination of neuropathy and carpal tunnel. He is on chronic prednisone 5 mg since September 2017  with good control of his pseudogout.  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 No mold at home. Smoking history: 3 packs per day for 45 years. Quit in 1999 Travel History: Not significant  Interim history: Started on stiolto with improvement in dyspnea.  Still has some cough with white mucus.  Otherwise no new complaints.  Outpatient Encounter Prescriptions as of 01/27/2017  Medication Sig  . acetaminophen (TYLENOL) 325 MG tablet Take 2 tablets (650 mg total) by mouth every 6 (six) hours as needed for mild pain.  [name] atorvastatin (LIPITOR) 40 MG tablet Take 20 mg by mouth daily.  [name] b complex-vitamin c-folic acid (NEPHRO-VITE) 0.8 MG TABS Take 1 tablet by mouth daily.   . bromocriptine (PARLODEL) 5 MG capsule Take 5 mg by mouth at bedtime.   . calcium acetate (PHOSLO) 667 MG capsule Take 667 mg by mouth 4 (four) times daily.   . cetirizine (ZYRTEC) 10 MG tablet Take 10 mg by mouth at bedtime.   . cinacalcet (SENSIPAR) 30 MG tablet Take 30 mg by mouth every evening.   . diltiazem (CARDIZEM) 30 MG tablet Take 1 tablet (30 mg total) by mouth every 12 (twelve) hours.  . dorzolamide-timolol (COSOPT) 22.3-6.8 MG/ML ophthalmic solution Place 1 drop into the right eye 2 (two) times daily.  . Menthol (ICY HOT) 5 % PTCH Apply topically. ON IN THE AM AND OFF IN THE PM FOR 10 DAYS  . methocarbamol (ROBAXIN) 500 MG tablet Take 1 tablet (500 mg total) by mouth every 6 (six) hours as needed for muscle spasms.  [name]  metoprolol tartrate (LOPRESSOR) 50 MG tablet Take 1 tablet (50 mg total) by mouth 2 (two) times daily.  . midodrine (PROAMATINE) 10 MG tablet Take 10 mg by mouth as directed. TAKE 1 TABLET THREE TIMES A DAY ON  Monday, Wednesday, AND Friday AFTER DIALYSIS AS NEEDED FOR LOW BLOOD PRESSURE  . omeprazole (PRILOSEC) 20 MG capsule Take 20 mg by mouth daily.  [name] oxyCODONE-acetaminophen (ROXICET) 5-325 MG tablet Take 1  tablet by mouth every 8 (eight) hours as needed for severe pain.  . polyethylene glycol (MIRALAX / GLYCOLAX) packet Take 17 g by mouth daily.  . predniSONE (DELTASONE) 5 MG tablet Take 5 mg by mouth daily.   [name] senna-docusate (SENOKOT-S) 8.6-50 MG tablet Take 1 tablet by mouth at bedtime.  . sertraline (ZOLOFT) 50 MG tablet Take 50 mg by mouth daily.  . Tiotropium Bromide-Olodaterol (STIOLTO RESPIMAT) 2.5-2.5 MCG/ACT AERS Inhale 2 puffs into the lungs daily.  [name] warfarin (COUMADIN) 5 MG tablet Take 1-1.5 tablets by mouth daily as directed by coumadin clinic (Patient taking differently: Take 5-7.5 mg by mouth See admin instructions. Take 1-1.5 tablets by mouth daily as directed by coumadin clinic  Current dose is 5 mg (1 tablet) daily.)   No facility-administered encounter medications on file as of 01/27/2017.     Allergies as of 01/27/2017 - Review Complete 12/30/2016  Allergen Reaction Noted  . Penicillins Swelling 01/21/2011  . Codeine Nausea Only 06/27/2014  . Tramadol Nausea Only 06/27/2014    Past Medical History:  Diagnosis Date  . Anemia   . Aortic stenosis 06/15/12   TEE - EF 53-66%; grade 1 diastolic dysfunction; mild/mod aortic valve stenosis; Mitral valve had calcified annulus, mild pulm htn PA peak pressure 55mmHg  . Barrett's esophagus 05/2003  . Bradycardia 2017   [employer] 2240 Assurity dual-lead pacemaker  . Carpal tunnel syndrome, bilateral 11/03/2015  . Colon polyps   . CVA (cerebral infarction)    2004/affected left side  . Depression   . Diabetes mellitus without complication ([place])   . Diabetic peripheral neuropathy ([place]) 10/02/2015  . Diverticulosis   . Dyspnea    with exertion  . End stage renal disease ([place])    hemodialysis 3 times a week  . GERD (gastroesophageal reflux disease)   . Glaucoma   . Hyperlipidemia   . Hypertension   . Kidney stones   . Legally blind   . Macular degeneration    both eyes  . Orthostatic hypotension 09/09/2015  .  Paroxysmal atrial fibrillation ([facility])   . Peptic ulcer    bleeding, 1969  . Presence of permanent cardiac pacemaker   . S/P epidural steroid injection    last  injection over 10 years ago  . Seasonal allergies   . Tubular adenoma of colon 07/2001    Past Surgical History:  Procedure Laterality Date  . AV FISTULA PLACEMENT  2009  . BACK SURGERY    . CARDIOVERSION N/A 11/13/2015   Procedure: CARDIOVERSION;  Surgeon: Troy Sine, MD;  Location: [facility] ENDOSCOPY;  Service: Cardiovascular;  Laterality: N/A;  . CARDIOVERSION N/A 01/13/2016   Procedure: CARDIOVERSION;  Surgeon: Will Meredith [name], MD;  Location: [place];  Service: Cardiovascular;  Laterality: N/A;  . [place]   right eye  . CYSTOSCOPY  several times   kidney stones  . EP IMPLANTABLE DEVICE N/A 03/11/2015   Procedure: Pacemaker Implant;  Surgeon: Will Meredith [name], MD;  [place];  Laterality: Left  .  LAMINECTOMY  1969  . RIGHT/LEFT HEART CATH AND CORONARY ANGIOGRAPHY N/A 08/20/2016   Procedure: Right/Left Heart Cath and Coronary Angiography;  Surgeon: Burnell Blanks, MD;  Location: [place] CV LAB;  Service: Cardiovascular;  Laterality: N/A;  . TEE WITHOUT CARDIOVERSION N/A 07/22/2016   Procedure: TRANSESOPHAGEAL ECHOCARDIOGRAM (TEE);  Surgeon: Skeet Latch, MD;  Location: [place];  Service: Cardiovascular;  Laterality: N/A;  . TEE WITHOUT CARDIOVERSION N/A 08/31/2016   Procedure: TRANSESOPHAGEAL ECHOCARDIOGRAM (TEE);  Surgeon: Burnell Blanks, MD;  Location: [place];  Service: Open Heart Surgery;  Laterality: N/A;  . TEE WITHOUT CARDIOVERSION N/A 12/01/2016   Procedure: TRANSESOPHAGEAL ECHOCARDIOGRAM (TEE);  Surgeon: Larey Dresser, MD;  Location: [facility] ENDOSCOPY;  Service: Cardiovascular;  Laterality: N/A;  . TONSILLECTOMY  1964  . TRANSCATHETER AORTIC VALVE REPLACEMENT, TRANSFEMORAL N/A 08/31/2016   Procedure: TRANSCATHETER AORTIC VALVE REPLACEMENT,  TRANSFEMORAL;  Surgeon: Burnell Blanks, MD;  Location: [place];  Service: Open Heart Surgery;  Laterality: N/A;    Family History  Problem Relation Age of Onset  . Stomach cancer Mother   . Hypertension Father        Died of heart attack  . Heart attack Father   . Stroke Sister   . Heart disease Sister   . Cancer Brother   . Colon cancer Neg Hx     Social History   Social History  . Marital status: Married    Spouse name: N/A  . Number of children: 3  . Years of education: 14   Occupational History  . 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 Smoking status: Former Smoker    Packs/day: 2.00    Years: 45.00    Types: Cigarettes    Quit date: 01/20/1998  . Smokeless tobacco: Never Used  . Alcohol use No  . Drug use: No  . Sexual activity: Yes   Other Topics Concern  . Not on file   Social History Narrative   Lives at home w/ his wife   Right-handed   Drinks 1 cup of coffee per day    Review of systems: Review of Systems  Constitutional: Negative for fever and chills.  HENT: Negative.   Eyes: Negative for blurred vision.  Respiratory: as per HPI  Cardiovascular: Negative for chest pain and palpitations.  Gastrointestinal: Negative for vomiting, diarrhea, blood per rectum. Genitourinary: Negative for dysuria, urgency, frequency and hematuria.  Musculoskeletal: Negative for myalgias, back pain and joint pain.  Skin: Negative for itching and rash.  Neurological: Negative for dizziness, tremors, focal weakness, seizures and loss of consciousness.  Endo/Heme/Allergies: Negative for environmental allergies.  Psychiatric/Behavioral: Negative for depression, suicidal ideas and hallucinations.  All other systems reviewed and are negative.  Physical Exam: Blood pressure 124/76, pulse 70, height 5' 10.5" (1.791 m), weight 86.7 kg (191 lb 3.2 oz), SpO2 98 %. Gen:      No acute distress HEENT:  EOMI, sclera anicteric Neck:     No masses; no  thyromegaly Lungs:    Clear to auscultation bilaterally; normal respiratory effort CV:         Regular rate and rhythm; no murmurs Abd:      + bowel sounds; soft, non-tender; no palpable masses, no distension Ext:    No edema; adequate peripheral perfusion Skin:      Warm and dry; no rash Neuro: alert and oriented x 3 Psych: normal mood and affect  Data Reviewed: CT abdomen pelvis 05/23/03-clear lung bases. CT abdomen  pelvis 01/28/11-reticular opacities, atelectasis at the lung bases CT chest 04/03/14-left apical bulla, emphysematous changes, reticular opacities at the lung bases CT chest 10/05/15-left apical bulla emphysematous changes, reticular opacities at the lung bases CT chest 08/26/16- left apical bulla, emphysematous changes. basal subpleural opacities CT chest 11/23/16-left apical bulla, emphysematous changes, reticular opacities/atelectasis predominantly at the lung bases. CT resolution 01/04/17-no interstitial lung disease, emphysematous changes, coronary atherosclerosis mild CHF I have reviewed all images personally.  PFTs  03/14/14 FVC 4.28 (99%], FEV1 3.10 (99%], TLC 91%, DLCO 84% Minimal obstruction.  08/24/16  FVC 3.28 [78%], FEV1 2.42 (79%],  F/F 74, TLC 76%, DLCO 63% Minimal obstruction, minimal restriction with mild diffusion defect.  Received records from Dr. Shelia Media, primary care physician and Dr. Lahoma Rocker, Rheumatology   Right knee arthrocentesis 11/18/25-cell count 374, positive for CPPD crystal, culture negative Sed rate 62, uric acid 3.3, rheumatoid factor negative, ANA negative, CCP antibody negative Right knee chest x-ray with chondrocalcinosis and osteoarthritis.   Assessment:  Emphysema Dyspnea may be from a combination of his cardiac issues, volume overload from end-stage renal disease and COPD His lung imaging and PFTs show emphysema from significant smoking history.  There is no evidence of significant obstruction on PFTs Clinically he has responded well  to stiolto inhaler and will continue the same.  Evaluation for interstitial lung disease He had a high-resolution CT done with no evidence of lung fibrosis or interstitial lung disease CT also shows CHF and coronary artery disease.  He follows up with cardiology for this.  Pseudogout Continues on low-dose prednisone at 5 mg.  Plan/Recommendations: - Continue Stiolto inhaler   Marshell Garfinkel MD [facility] Pager [phone] 01/27/2017, 12:18 PM  CC: Deland Pretty, MD

## 2017-01-28 ENCOUNTER — Ambulatory Visit (INDEPENDENT_AMBULATORY_CARE_PROVIDER_SITE_OTHER): Payer: Medicare Other | Admitting: Pharmacist Clinician (PhC)/ Clinical Pharmacy Specialist

## 2017-01-28 ENCOUNTER — Telehealth: Payer: Self-pay | Admitting: Pharmacist Clinician (PhC)/ Clinical Pharmacy Specialist

## 2017-01-28 DIAGNOSIS — I4891 Unspecified atrial fibrillation: Secondary | ICD-10-CM

## 2017-01-28 DIAGNOSIS — N186 End stage renal disease: Secondary | ICD-10-CM | POA: Diagnosis not present

## 2017-01-28 DIAGNOSIS — N2581 Secondary hyperparathyroidism of renal origin: Secondary | ICD-10-CM | POA: Diagnosis not present

## 2017-01-28 DIAGNOSIS — Z7901 Long term (current) use of anticoagulants: Secondary | ICD-10-CM | POA: Diagnosis not present

## 2017-01-28 DIAGNOSIS — D631 Anemia in chronic kidney disease: Secondary | ICD-10-CM | POA: Diagnosis not present

## 2017-01-28 DIAGNOSIS — E1129 Type 2 diabetes mellitus with other diabetic kidney complication: Secondary | ICD-10-CM | POA: Diagnosis not present

## 2017-01-28 LAB — POCT INR: INR: 4.2

## 2017-01-28 NOTE — Telephone Encounter (Signed)
If Home Health RN is calling please get Coumadin Nurse on the phone STAT  1.  Are you calling in regards to an appointment? no  2.  Are you calling for a refill ? no  3.  Are you having bleeding issues? no  4.  Do you need clearance to hold Coumadin? No     Pt coumadin doses has changed and RN Santa Genera needs a signed order  Please fax [phone]   Please route to the Coumadin Clinic Pool

## 2017-01-28 NOTE — Telephone Encounter (Signed)
INR orders printed, signed and faxed

## 2017-01-29 ENCOUNTER — Encounter (HOSPITAL_COMMUNITY): Payer: Self-pay | Admitting: *Deleted

## 2017-01-29 ENCOUNTER — Emergency Department (HOSPITAL_COMMUNITY)
Admission: EM | Admit: 2017-01-29 | Discharge: 2017-01-29 | Disposition: A | Discharge status: Home / Self Care | Payer: Medicare Other | Attending: Emergency Medicine | Admitting: Emergency Medicine

## 2017-01-29 DIAGNOSIS — T82898A Other specified complication of vascular prosthetic devices, implants and grafts, initial encounter: Secondary | ICD-10-CM | POA: Diagnosis not present

## 2017-01-29 DIAGNOSIS — E1122 Type 2 diabetes mellitus with diabetic chronic kidney disease: Secondary | ICD-10-CM | POA: Insufficient documentation

## 2017-01-29 DIAGNOSIS — I120 Hypertensive chronic kidney disease with stage 5 chronic kidney disease or end stage renal disease: Secondary | ICD-10-CM | POA: Insufficient documentation

## 2017-01-29 DIAGNOSIS — Z79899 Other long term (current) drug therapy: Secondary | ICD-10-CM | POA: Insufficient documentation

## 2017-01-29 DIAGNOSIS — R58 Hemorrhage, not elsewhere classified: Secondary | ICD-10-CM

## 2017-01-29 DIAGNOSIS — T82838A Hemorrhage of vascular prosthetic devices, implants and grafts, initial encounter: Secondary | ICD-10-CM | POA: Diagnosis not present

## 2017-01-29 DIAGNOSIS — Y829 Unspecified medical devices associated with adverse incidents: Secondary | ICD-10-CM | POA: Insufficient documentation

## 2017-01-29 DIAGNOSIS — Z87891 Personal history of nicotine dependence: Secondary | ICD-10-CM | POA: Insufficient documentation

## 2017-01-29 DIAGNOSIS — N186 End stage renal disease: Secondary | ICD-10-CM | POA: Insufficient documentation

## 2017-01-29 DIAGNOSIS — T829XXA Unspecified complication of cardiac and vascular prosthetic device, implant and graft, initial encounter: Secondary | ICD-10-CM

## 2017-01-29 DIAGNOSIS — Z7901 Long term (current) use of anticoagulants: Secondary | ICD-10-CM | POA: Diagnosis not present

## 2017-01-29 LAB — COMPREHENSIVE METABOLIC PANEL
ALBUMIN: 3.2 g/dL — AB (ref 3.5–5.0)
ALK PHOS: 95 U/L (ref 38–126)
ALT: 19 U/L (ref 17–63)
AST: 20 U/L (ref 15–41)
Anion gap: 8 (ref 5–15)
BILIRUBIN TOTAL: 0.8 mg/dL (ref 0.3–1.2)
BUN: 22 mg/dL — ABNORMAL HIGH (ref 6–20)
CHLORIDE: 101 mmol/L (ref 101–111)
CO2: 32 mmol/L (ref 22–32)
Calcium: 8.5 mg/dL — ABNORMAL LOW (ref 8.9–10.3)
Creatinine, Ser: 4.91 mg/dL — ABNORMAL HIGH (ref 0.61–1.24)
GFR calc Af Amer: 12 mL/min — ABNORMAL LOW (ref >60)
GFR, EST NON AFRICAN AMERICAN: 10 mL/min — AB (ref >60)
GLUCOSE: 106 mg/dL — AB (ref 65–99)
POTASSIUM: 3.7 mmol/L (ref 3.5–5.1)
Sodium: 141 mmol/L (ref 135–145)
TOTAL PROTEIN: 6.5 g/dL (ref 6.5–8.1)

## 2017-01-29 LAB — CBC WITH DIFFERENTIAL/PLATELET
Basophils Absolute: 0 10*3/uL (ref 0.0–0.1)
Basophils Relative: 0 %
EOS ABS: 0.1 10*3/uL (ref 0.0–0.7)
EOS PCT: 1 %
HCT: 31.8 % — ABNORMAL LOW (ref 39.0–52.0)
HEMOGLOBIN: 9.7 g/dL — AB (ref 13.0–17.0)
LYMPHS ABS: 1.3 10*3/uL (ref 0.7–4.0)
LYMPHS PCT: 19 %
MCH: 29.7 pg (ref 26.0–34.0)
MCHC: 30.5 g/dL (ref 30.0–36.0)
MCV: 97.2 fL (ref 78.0–100.0)
MONOS PCT: 6 %
Monocytes Absolute: 0.4 10*3/uL (ref 0.1–1.0)
NEUTROS PCT: 74 %
Neutro Abs: 5.2 10*3/uL (ref 1.7–7.7)
Platelets: 160 10*3/uL (ref 150–400)
RBC: 3.27 MIL/uL — ABNORMAL LOW (ref 4.22–5.81)
RDW: 20.7 % — ABNORMAL HIGH (ref 11.5–15.5)
WBC: 7.1 10*3/uL (ref 4.0–10.5)

## 2017-01-29 LAB — PROTIME-INR
INR: 2.3
PROTHROMBIN TIME: 25.1 s — AB (ref 11.4–15.2)

## 2017-01-29 NOTE — ED Provider Notes (Signed)
[facility] EMERGENCY DEPARTMENT Provider Note   CSN: [id number] Arrival date & time: 01/29/17  0704     History   Chief Complaint Chief Complaint  Patient presents with  . Vascular Access Problem    HPI Alexander [name] is a 76 y.o. male.       The history is provided by the patient, a relative and medical records. No language interpreter was used.  Wound Check  This is a new problem. The current episode started 12 to 24 hours ago. The problem occurs constantly. The problem has not changed since onset.Pertinent negatives include no chest pain, no abdominal pain, no headaches and no shortness of breath. Nothing aggravates the symptoms. Nothing relieves the symptoms. He has tried nothing for the symptoms. The treatment provided no relief.    Past Medical History:  Diagnosis Date  . Anemia   . Aortic stenosis 06/15/12   TEE - EF 98-92%; grade 1 diastolic dysfunction; mild/mod aortic valve stenosis; Mitral valve had calcified annulus, mild pulm htn PA peak pressure 64mmHg  . Barrett's esophagus 05/2003  . Bradycardia 2017   [employer] 2240 Assurity dual-lead pacemaker  . Carpal tunnel syndrome, bilateral 11/03/2015  . Colon polyps   . CVA (cerebral infarction)    2004/affected left side  . Depression   . Diabetes mellitus without complication ([place])   . Diabetic peripheral neuropathy ([place]) 10/02/2015  . Diverticulosis   . Dyspnea    with exertion  . End stage renal disease ([place])    hemodialysis 3 times a week  . GERD (gastroesophageal reflux disease)   . Glaucoma   . Hyperlipidemia   . Hypertension   . Kidney stones   . Legally blind   . Macular degeneration    both eyes  . Orthostatic hypotension 09/09/2015  . Paroxysmal atrial fibrillation (HCC)   . Peptic ulcer    bleeding, 1969  . Presence of permanent cardiac pacemaker   . S/P epidural steroid injection    last  injection over 10 years ago  . Seasonal allergies   . Tubular adenoma of colon  07/2001    Patient Active Problem List   Diagnosis Date Noted  . Presence of permanent cardiac pacemaker 11/24/2016  . Streptococcal bacteremia 11/24/2016  . Atrial fibrillation with RVR ([place]) 11/23/2016  . Glaucoma 11/23/2016  . GERD (gastroesophageal reflux disease) 11/23/2016  . Hypomagnesemia 11/23/2016  . Type 2 diabetes mellitus ([place]) 11/23/2016  . Depression 11/23/2016  . SIRS (systemic inflammatory response syndrome) ([place]) 11/23/2016  . S/P TAVR (transcatheter aortic valve replacement) 08/31/2016  . Long term (current) use of anticoagulants [Z79.01] 08/13/2016  . Persistent atrial fibrillation ([place])   . Carpal tunnel syndrome, bilateral 11/03/2015  . Diabetic peripheral neuropathy ([place]) 10/02/2015  . Long-term (current) use of anticoagulants 04/08/2014  . Hyperlipidemia 04/04/2014  . ESRD (end stage renal disease) on dialysis ([place]) 03/13/2013  . PAD (peripheral artery disease) ([place]) 12/12/2012  . Severe aortic stenosis 06/15/2012  . Personal history of colonic polyps 02/03/2011  . Esophageal reflux 02/03/2011  . Anemia, unspecified 02/03/2011    Past Surgical History:  Procedure Laterality Date  . AV FISTULA PLACEMENT  2009  . BACK SURGERY    . CARDIOVERSION N/A 11/13/2015   Procedure: CARDIOVERSION;  Surgeon: Troy Sine, MD;  Location: [facility] ENDOSCOPY;  Service: Cardiovascular;  Laterality: N/A;  . CARDIOVERSION N/A 01/13/2016   Procedure: CARDIOVERSION;  Surgeon: Will Meredith [name], MD;  Location: [facility];  Service: Cardiovascular;  Laterality: N/A;  . CORNEAL TRANSPLANT  1999   right eye  . CYSTOSCOPY  several times   kidney stones  . EP IMPLANTABLE DEVICE N/A 03/11/2015   Procedure: Pacemaker Implant;  Surgeon: Will Meredith [name], MD;  [place];  Laterality: Left  . LAMINECTOMY  1969  . RIGHT/LEFT HEART CATH AND CORONARY ANGIOGRAPHY N/A 08/20/2016   Procedure: Right/Left Heart Cath and Coronary Angiography;  Surgeon: Burnell Blanks, MD;  Location: [facility] CV LAB;  Service: Cardiovascular;  Laterality: N/A;  . TEE WITHOUT CARDIOVERSION N/A 07/22/2016   Procedure: TRANSESOPHAGEAL ECHOCARDIOGRAM (TEE);  Surgeon: Skeet Latch, MD;  Location: [facility];  Service: Cardiovascular;  Laterality: N/A;  . TEE WITHOUT CARDIOVERSION N/A 08/31/2016   Procedure: TRANSESOPHAGEAL ECHOCARDIOGRAM (TEE);  Surgeon: Burnell Blanks, MD;  Location: [facility];  Service: Open Heart Surgery;  Laterality: N/A;  . TEE WITHOUT CARDIOVERSION N/A 12/01/2016   Procedure: TRANSESOPHAGEAL ECHOCARDIOGRAM (TEE);  Surgeon: Larey Dresser, MD;  Location: [facility] ENDOSCOPY;  Service: Cardiovascular;  Laterality: N/A;  . TONSILLECTOMY  1964  . TRANSCATHETER AORTIC VALVE REPLACEMENT, TRANSFEMORAL N/A 08/31/2016   Procedure: TRANSCATHETER AORTIC VALVE REPLACEMENT, TRANSFEMORAL;  Surgeon: Burnell Blanks, MD;  Location: [facility];  Service: Open Heart Surgery;  Laterality: N/A;       Home Medications    Prior to Admission medications   Medication Sig Start Date End Date Taking? Authorizing Provider  acetaminophen (TYLENOL) 325 MG tablet Take 2 tablets (650 mg total) by mouth every 6 (six) hours as needed for mild pain. 09/02/16   Nani Skillern, PA-C  atorvastatin (LIPITOR) 40 MG tablet Take 20 mg by mouth daily.    [provider]  b complex-vitamin c-folic acid (NEPHRO-VITE) 0.8 MG TABS Take 1 tablet by mouth daily.     [provider]  bromocriptine (PARLODEL) 5 MG capsule Take 5 mg by mouth at bedtime.  11/30/10   [provider]  calcium acetate (PHOSLO) 667 MG capsule Take 667 mg by mouth 4 (four) times daily.     [provider]  cetirizine (ZYRTEC) 10 MG tablet Take 10 mg by mouth at bedtime.     [provider]  cinacalcet (SENSIPAR) 30 MG tablet Take 30 mg by mouth every evening.     [provider]  diltiazem (CARDIZEM) 30 MG tablet Take 1 tablet (30 mg total) by mouth  every 12 (twelve) hours. 12/02/16   Florencia Reasons, MD  dorzolamide-timolol (COSOPT) 22.3-6.8 MG/ML ophthalmic solution Place 1 drop into the right eye 2 (two) times daily. 08/13/13   [provider]  Menthol (ICY HOT) 5 % PTCH Apply topically. ON IN THE AM AND OFF IN THE PM FOR 10 DAYS    [provider]  methocarbamol (ROBAXIN) 500 MG tablet Take 1 tablet (500 mg total) by mouth every 6 (six) hours as needed for muscle spasms. 12/02/16   Florencia Reasons, MD  metoprolol tartrate (LOPRESSOR) 50 MG tablet Take 1 tablet (50 mg total) by mouth 2 (two) times daily. 12/02/16   Florencia Reasons, MD  midodrine (PROAMATINE) 10 MG tablet Take 10 mg by mouth as directed. TAKE 1 TABLET THREE TIMES A DAY ON  Monday, Wednesday, AND Friday AFTER DIALYSIS AS NEEDED FOR LOW BLOOD PRESSURE    [provider]  omeprazole (PRILOSEC) 20 MG capsule Take 20 mg by mouth daily.    [provider]  oxyCODONE-acetaminophen (ROXICET) 5-325 MG tablet Take 1 tablet by mouth every 8 (  eight) hours as needed for severe pain. 12/02/16   Florencia Reasons, MD  polyethylene glycol [facility] / Floria Raveling) packet Take 17 g by mouth daily. 12/02/16   Florencia Reasons, MD  predniSONE (DELTASONE) 5 MG tablet Take 5 mg by mouth daily.  04/27/16   [provider]  senna-docusate (SENOKOT-S) 8.6-50 MG tablet Take 1 tablet by mouth at bedtime. 12/02/16   Florencia Reasons, MD  sertraline (ZOLOFT) 50 MG tablet Take 50 mg by mouth daily. 11/18/16   [provider]  Tiotropium Bromide-Olodaterol (STIOLTO RESPIMAT) 2.5-2.5 MCG/ACT AERS Inhale 2 puffs into the lungs daily. 12/30/16   Mannam, Hart Robinsons, MD  Tiotropium Bromide-Olodaterol (STIOLTO RESPIMAT) 2.5-2.5 MCG/ACT AERS Inhale 2 puffs into the lungs daily. 01/27/17   Mannam, Hart Robinsons, MD  Tiotropium Bromide-Olodaterol (STIOLTO RESPIMAT) 2.5-2.5 MCG/ACT AERS Inhale 2 puffs into the lungs daily. 01/27/17   Mannam, Hart Robinsons, MD  warfarin (COUMADIN) 5 MG tablet Take 1-1.5 tablets by mouth daily as directed by  coumadin clinic Patient taking differently: Take 5-7.5 mg by mouth See admin instructions. Take 1-1.5 tablets by mouth daily as directed by coumadin clinic  Current dose is 5 mg (1 tablet) daily. 03/08/16   Skeet Latch, MD    Family History Family History  Problem Relation Age of Onset  . Stomach cancer Mother   . Hypertension Father        Died of heart attack  . Heart attack Father   . Stroke Sister   . Heart disease Sister   . Cancer Brother   . Colon cancer Neg Hx     Social History Social History  Substance Use Topics  . Smoking status: Former Smoker    Packs/day: 2.00    Years: 45.00    Types: Cigarettes    Quit date: 01/20/1998  . Smokeless tobacco: Never Used  . Alcohol use No     Allergies   Penicillins; Codeine; and Tramadol   Review of Systems Review of Systems  Constitutional: Negative for chills, diaphoresis, fatigue and fever.  HENT: Negative for congestion.   Respiratory: Negative for chest tightness, shortness of breath and wheezing.   Cardiovascular: Negative for chest pain, palpitations and leg swelling.  Gastrointestinal: Negative for abdominal pain, constipation, diarrhea, nausea and vomiting.  Genitourinary: Negative for flank pain.  Musculoskeletal: Negative for back pain, neck pain and neck stiffness.  Skin: Positive for wound. Negative for rash.  Neurological: Negative for light-headedness and headaches.  Psychiatric/Behavioral: Negative for agitation.  All other systems reviewed and are negative.    Physical Exam Updated Vital Signs BP 126/90 (BP Location: Right Arm)   Pulse 100   Temp 98.3 F (36.8 C) (Oral)   Resp 18   SpO2 100%   Physical Exam  Constitutional: He is oriented to person, place, and time. He appears well-developed and well-nourished.  HENT:  Head: Normocephalic and atraumatic.  Mouth/Throat: Oropharynx is clear and moist. No oropharyngeal exudate.  Eyes: Conjunctivae are normal. No scleral icterus.    Neck: Normal range of motion. Neck supple.  Cardiovascular: Normal rate and regular rhythm.   No murmur heard. Pulmonary/Chest: Effort normal and breath sounds normal. No stridor. No respiratory distress. He exhibits no tenderness.  Abdominal: Soft. There is no tenderness.  Musculoskeletal: He exhibits no edema or tenderness.       Left forearm: He exhibits no tenderness.       Arms: Neurological: He is alert and oriented to person, place, and time. No sensory deficit. He exhibits normal muscle tone.  Skin: Skin is warm and dry. Capillary refill takes less than 2 seconds. No pallor.  Psychiatric: He has a normal mood and affect.  Nursing note and vitals reviewed.    ED Treatments / Results  Labs (all labs ordered are listed, but only abnormal results are displayed) Labs Reviewed  CBC WITH DIFFERENTIAL/PLATELET - Abnormal; Notable for the following:       Result Value   RBC 3.27 (*)    Hemoglobin 9.7 (*)    HCT 31.8 (*)    RDW 20.7 (*)    All other components within normal limits  PROTIME-INR - Abnormal; Notable for the following:    Prothrombin Time 25.1 (*)    All other components within normal limits  COMPREHENSIVE METABOLIC PANEL - Abnormal; Notable for the following:    Glucose, Bld 106 (*)    BUN 22 (*)    Creatinine, Ser 4.91 (*)    Calcium 8.5 (*)    Albumin 3.2 (*)    GFR calc non Af Amer 10 (*)    GFR calc Af Amer 12 (*)    All other components within normal limits    EKG  EKG Interpretation None       Radiology No results found.  Procedures Procedures (including critical care time)  Medications Ordered in ED Medications - No data to display   Initial Impression / Assessment and Plan / ED Course  I have reviewed the triage vital signs and the nursing notes.  Pertinent labs & imaging results that were available during my care of the patient were reviewed by me and considered in my medical decision making (see chart for details).     Alexander [name] is a 76 y.o. male with a past medical history significant for pacemaker, atrial fibrillation on Coumadin therapy, aortic stenosis status post TAVR, stroke, hypertension, diabetes, and ESRD who presents with left forearm bleeding.  Patient reports that he had dialysis yesterday without difficulty.  He said that after the treatment, his left forearm wound was wrapped.  He says that he went to bed last night and this morning woke up in a pool of blood.  He denies any symptoms including no lightheadedness, palpitations, chest pain, or shortness of breath.  He denies passing out.  He denies fevers, chills, cough, congestion, nausea, vomiting, constipation, or diarrhea.  He reports he makes a very small amount of urine but that is unchanged.  He denies any preceding or other symptoms aside from of note, patient was told that his INR was slightly elevated yesterday when it was checked.  He is unsure of the exact number.  EMS was called when the patient was found to be covered in blood.  EMS dress the wound and since he arrived has not had any bleeding.  On my exam, patient has symmetric grip strength.  Normal sensation and capillary refill both hands.  Normal radial pulses.  Patient has a left forearm dialysis graft that has a great thrill and otherwise feels normal.  Exam otherwise unremarkable with clear lungs.  Murmur present.  Abdomen and chest are nontender.  Patient has a left forearm wrap with no active bleeding or soaking through the dressing.  Given patient's recent INR elevation, patient will have blood work performed to look for INR and anemia.  After blood work has returned, will take down the dressing to look at the wound.  Will allow the pressure dressing to remain in place to help continue to achieve hemostasis during  workup.  Wound is not significantly bleeding and if blood work is reassuring, suspect patient will be stable for discharge home.      9:32 AM Laboratory testing returned  showing INR of 2.3.  This is near patient's goal.  Patient's hemoglobin is similar to prior at 9.7.  Liver function is unremarkable and electrolytes are within normal limits.  Given reassuring blood work, patient's wound was examined.  Patient is now hemostatic with no bleeding.  Patient will have his wound redressed and cleaned.    Patient's arm was also reexamined and he has normal grip strength, sensation, and capillary refill.  Normal radial pulse.  Normal thrill.  No other abnormality is on repeat arm exam.  Given lack of bleeding and improvement in symptoms, patient will be discharged with plans to follow-up with PCP and continue his outpatient regimen.    Patient understood return precautions and was discharged in good condition.   Final Clinical Impressions(s) / ED Diagnoses   Final diagnoses:  Complication of vascular access for dialysis, initial encounter  Bleeding    New Prescriptions New Prescriptions   No medications on file    Clinical Impression: 1. Complication of vascular access for dialysis, initial encounter   2. Bleeding     Disposition: Discharge  Condition: Good  I have discussed the results, Dx and Tx plan with the pt(& family if present). He/she/they expressed understanding and agree(s) with the plan. Discharge instructions discussed at great length. Strict return precautions discussed and pt &/or family have verbalized understanding of the instructions. No further questions at time of discharge.    New Prescriptions   No medications on file    Follow Up: Deland Pretty, [place] [address] [phone]     [facility] [address] [id number] [place] [phone]  If symptoms worsen     Jolly Bleicher, Gwenyth Allegra, MD 01/29/17 1008

## 2017-01-29 NOTE — Discharge Instructions (Signed)
Your laboratory testing was reassuring today.  Your wound to stop bleeding after pressure with EMS.  As you have no further bleeding, we feel you are safe for discharge home.  Please follow-up with your PCP and watch for further problems.  If any symptoms change or worsen, please return to the nearest emergency department.

## 2017-01-29 NOTE — ED Notes (Addendum)
Left arm cleaned and clean bandage applied prior to discharge

## 2017-01-31 ENCOUNTER — Telehealth: Payer: Self-pay | Admitting: Cardiovascular Disease

## 2017-01-31 DIAGNOSIS — N186 End stage renal disease: Secondary | ICD-10-CM | POA: Diagnosis not present

## 2017-01-31 DIAGNOSIS — D631 Anemia in chronic kidney disease: Secondary | ICD-10-CM | POA: Diagnosis not present

## 2017-01-31 DIAGNOSIS — N2581 Secondary hyperparathyroidism of renal origin: Secondary | ICD-10-CM | POA: Diagnosis not present

## 2017-01-31 DIAGNOSIS — E1129 Type 2 diabetes mellitus with other diabetic kidney complication: Secondary | ICD-10-CM | POA: Diagnosis not present

## 2017-01-31 NOTE — Telephone Encounter (Signed)
Returned call to daughter (ok per DPR)-daughter states she thinks that patients facility is confused about the midodrine instructions.   Daughter states she was under the impression that it was suppose to be 10 mg (3 tablets three times daily as needed on MWF).   Daughter states the prescription bottle states this as well.   Advised per chart review, our prescription and last OV note state midodrine 10 mg (1 tablet TID as needed on MWF).    Daughter would like for this to be clarified and make sure they are giving it correctly.     Daughter also reports concern that the facility is in charge of giving patient his medications and they will not allow him to take a dose of midodrine with him to dialysis.     Will clarify with [name].   Daughter aware and verbalized understanding.

## 2017-01-31 NOTE — Telephone Encounter (Signed)
Confirmed with Dr Oval Linsey Midodrine 10 mg three times a day as needed on dialysis days was correct. Explained to daughter prior to going in the hospital 10/2016 he was taking Midodrine 5 mg 3 tablets three times a d ay as needed on dialysis days

## 2017-01-31 NOTE — Telephone Encounter (Signed)
Mrs.Hedrick (Daughter) is  Calling about her father medication (Midodrine ) it seems to be some confusion about the amount the timing he is supposed to get with this medication at the facility [facility] [address]) .  She is needing some clarification on it . She understand that it should be 3 pills three times a day on MWF as needed and they are giving it to him in the morning , noon and night ev ery MWF . Please call

## 2017-02-01 ENCOUNTER — Ambulatory Visit (INDEPENDENT_AMBULATORY_CARE_PROVIDER_SITE_OTHER): Payer: Medicare Other | Admitting: *Deleted

## 2017-02-01 ENCOUNTER — Other Ambulatory Visit: Payer: Self-pay | Admitting: Licensed Clinical Social Worker

## 2017-02-01 ENCOUNTER — Telehealth: Payer: Self-pay | Admitting: Cardiology

## 2017-02-01 DIAGNOSIS — E1122 Type 2 diabetes mellitus with diabetic chronic kidney disease: Secondary | ICD-10-CM | POA: Diagnosis not present

## 2017-02-01 DIAGNOSIS — I495 Sick sinus syndrome: Secondary | ICD-10-CM

## 2017-02-01 DIAGNOSIS — M6281 Muscle weakness (generalized): Secondary | ICD-10-CM | POA: Diagnosis not present

## 2017-02-01 DIAGNOSIS — N186 End stage renal disease: Secondary | ICD-10-CM | POA: Diagnosis not present

## 2017-02-01 DIAGNOSIS — R2681 Unsteadiness on feet: Secondary | ICD-10-CM | POA: Diagnosis not present

## 2017-02-01 DIAGNOSIS — D631 Anemia in chronic kidney disease: Secondary | ICD-10-CM | POA: Diagnosis not present

## 2017-02-01 DIAGNOSIS — F329 Major depressive disorder, single episode, unspecified: Secondary | ICD-10-CM | POA: Diagnosis not present

## 2017-02-01 NOTE — Telephone Encounter (Signed)
Confirmed remote transmission w/ pt daughter.   

## 2017-02-01 NOTE — Patient Outreach (Signed)
[facility]) Care Management  [facility] Social Work  02/01/2017  Alexander [name] 28-Sep-1940 [id number]   Encounter Medications:  Outpatient Encounter Medications as of 02/01/2017  Medication Sig  . acetaminophen (TYLENOL) 325 MG tablet Take 2 tablets (650 mg total) by mouth every 6 (six) hours as needed for mild pain.  [name] atorvastatin (LIPITOR) 40 MG tablet Take 20 mg by mouth daily.  [name] b complex-vitamin c-folic acid (NEPHRO-VITE) 0.8 MG TABS Take 1 tablet by mouth daily.   . bromocriptine (PARLODEL) 5 MG capsule Take 5 mg by mouth at bedtime.   . calcium acetate (PHOSLO) 667 MG capsule Take 667 mg by mouth 4 (four) times daily.   . cetirizine (ZYRTEC) 10 MG tablet Take 10 mg by mouth at bedtime.   . cinacalcet (SENSIPAR) 30 MG tablet Take 30 mg by mouth every evening.   . diltiazem (CARDIZEM) 30 MG tablet Take 1 tablet (30 mg total) by mouth every 12 (twelve) hours.  . dorzolamide-timolol (COSOPT) 22.3-6.8 MG/ML ophthalmic solution Place 1 drop into the right eye 2 (two) times daily.  . Menthol (ICY HOT) 5 % PTCH Apply topically. ON IN THE AM AND OFF IN THE PM FOR 10 DAYS  . methocarbamol (ROBAXIN) 500 MG tablet Take 1 tablet (500 mg total) by mouth every 6 (six) hours as needed for muscle spasms.  . metoprolol tartrate (LOPRESSOR) 50 MG tablet Take 1 tablet (50 mg total) by mouth 2 (two) times daily.  . midodrine (PROAMATINE) 10 MG tablet Take 10 mg by mouth as directed. TAKE 1 TABLET THREE TIMES A DAY ON  Monday, Wednesday, AND Friday AFTER DIALYSIS AS NEEDED FOR LOW BLOOD PRESSURE  . omeprazole (PRILOSEC) 20 MG capsule Take 20 mg by mouth daily.  [name] oxyCODONE-acetaminophen (ROXICET) 5-325 MG tablet Take 1 tablet by mouth every 8 (eight) hours as needed for severe pain.  . polyethylene glycol (MIRALAX / GLYCOLAX) packet Take 17 g by mouth daily.  . predniSONE (DELTASONE) 5 MG tablet Take 5 mg by mouth daily.   [name] senna-docusate (SENOKOT-S) 8.6-50 MG tablet Take 1 tablet by  mouth at bedtime.  . sertraline (ZOLOFT) 50 MG tablet Take 50 mg by mouth daily.  . Tiotropium Bromide-Olodaterol (STIOLTO RESPIMAT) 2.5-2.5 MCG/ACT AERS Inhale 2 puffs into the lungs daily. (Patient not taking: Reported on 01/29/2017)  . Tiotropium Bromide-Olodaterol (STIOLTO RESPIMAT) 2.5-2.5 MCG/ACT AERS Inhale 2 puffs into the lungs daily.  . Tiotropium Bromide-Olodaterol (STIOLTO RESPIMAT) 2.5-2.5 MCG/ACT AERS Inhale 2 puffs into the lungs daily. (Patient not taking: Reported on 01/29/2017)  . warfarin (COUMADIN) 5 MG tablet Take 1-1.5 tablets by mouth daily as directed by coumadin clinic (Patient taking differently: Take 5 mg by mouth See admin instructions. Take 1 tablet (97m totally) by mouth daily.)   No facility-administered encounter medications on file as of 02/01/2017.     Functional Status:  In your present state of health, do you have any difficulty performing the following activities: 11/24/2016 11/24/2016  Hearing? N N  Comment - -  Vision? Y -  Comment - -  Difficulty concentrating or making decisions? N -  Comment - -  Walking or climbing stairs? Y -  Comment - -  Dressing or bathing? N -  Comment - -  Doing errands, shopping? YChuichuand eating ? - -  Comment - -  Using the Toilet? - -  Comment - -  In the past six months, have you accidently  leaked urine? - -  Comment - -  Do you have problems with loss of bowel control? - -  Comment - -  Managing your Medications? - -  Comment - -  Managing your Finances? - -  Comment - -  Housekeeping or managing your Housekeeping? - -  Comment - -  Some recent data might be hidden    Fall/Depression Screening:  PHQ 2/9 Scores 12/03/2016 10/26/2016 10/04/2016  PHQ - 2 Score 0 0 -  Exception Documentation - - Other- indicate reason in comment box  Not completed - - call completed with dtr    Assessment: CSW arrived at [facility] ALF in order to complete home visit. CSW arrived at patient's room and  patient was in the recliner watching television. Patient reports being somewhat overwhelmed with his multiple medical appointments throughout the weeks. Emotional support and validation provided. Patient shares that he is still receiving PT and OT. He states that he is only experiencing slight pain in his leg. He states otherwise he is very comfortable. Patient reports that he plans on staying at ALF for another month and then will transition back home. Patient reports that he appetite is normal. He continues to use his walker to walk down to the cafeteria for both lunch and dinner. He states that he has made some improvement with gaining appropriate socialization but would prefer goal to be ongoing. He reports that he did sit and play cards with a group of friends at the ALF this week which he enjoyed. CSW appraised this success. Patient reports that he is still using his wheelchair to go to dialysis. He reports having "no complaints" when questioned how facility is and staff. Patient reports that he continues to experience periods of time where he is very tired and weak. Patient is hopeful that this will discontinue at some point.  [facility] CM Care Plan Problem One     Most Recent Value  Care Plan Problem One  SNF admission  Role Documenting the Problem One  Clinical Social Worker  Care Plan for Problem One  Active  [facility] Long Term Goal   Patient will have a safe and stable discharge back home from ALF within 90 days  [place] Term Goal Start Date  12/03/16  Interventions for Problem One Long Term Goal  Goal has been adjusted due to ALF placement. This is only a temporary placement. Patient reports transition to ALF continues to go well. Reports issues with medications that now have been resolved. Patient reports possibly returning back home within a month. CSW continues to collaborate with family, ALF clinical staff and patient. CSW provided education on having a safe transition from ALF back to home.  [facility] CM  Short Term Goal #1   Family will gain personal care resources and will explore them within 30 days  [facility] CM Short Term Goal #1 Start Date  12/03/16  [facility] CM Short Term Goal #1 Met Date  01/04/17  Interventions for Short Term Goal #1  Goal was met. Resources were reviewed and provided. However, resources will not be needed until patient returns home.  [facility] CM Short Term Goal #2   Patient will increase socialization over the next 30 days  [facility] CM Short Term Goal #2 Start Date  02/01/17  Interventions for Short Term Goal #2  Goal needs to be continued. There have been slight improvement in his socialization. CSW provided motivational interviewing interventions. Goal ongoing.     Plan: CSW will follow up  within 30 days and continue to provide social work assistance and support.  [name], BSW, MSW, [email or web address] Phone: [phone] Fax: [phone]

## 2017-02-02 DIAGNOSIS — E1129 Type 2 diabetes mellitus with other diabetic kidney complication: Secondary | ICD-10-CM | POA: Diagnosis not present

## 2017-02-02 DIAGNOSIS — D631 Anemia in chronic kidney disease: Secondary | ICD-10-CM | POA: Diagnosis not present

## 2017-02-02 DIAGNOSIS — N186 End stage renal disease: Secondary | ICD-10-CM | POA: Diagnosis not present

## 2017-02-02 DIAGNOSIS — N2581 Secondary hyperparathyroidism of renal origin: Secondary | ICD-10-CM | POA: Diagnosis not present

## 2017-02-02 NOTE — Progress Notes (Signed)
Remote pacemaker transmission.   

## 2017-02-03 ENCOUNTER — Encounter: Payer: Self-pay | Admitting: Cardiology

## 2017-02-03 DIAGNOSIS — N186 End stage renal disease: Secondary | ICD-10-CM | POA: Diagnosis not present

## 2017-02-03 DIAGNOSIS — F329 Major depressive disorder, single episode, unspecified: Secondary | ICD-10-CM | POA: Diagnosis not present

## 2017-02-03 DIAGNOSIS — E1122 Type 2 diabetes mellitus with diabetic chronic kidney disease: Secondary | ICD-10-CM | POA: Diagnosis not present

## 2017-02-03 DIAGNOSIS — R2681 Unsteadiness on feet: Secondary | ICD-10-CM | POA: Diagnosis not present

## 2017-02-03 DIAGNOSIS — M6281 Muscle weakness (generalized): Secondary | ICD-10-CM | POA: Diagnosis not present

## 2017-02-03 DIAGNOSIS — D631 Anemia in chronic kidney disease: Secondary | ICD-10-CM | POA: Diagnosis not present

## 2017-02-04 DIAGNOSIS — N2581 Secondary hyperparathyroidism of renal origin: Secondary | ICD-10-CM | POA: Diagnosis not present

## 2017-02-04 DIAGNOSIS — E1129 Type 2 diabetes mellitus with other diabetic kidney complication: Secondary | ICD-10-CM | POA: Diagnosis not present

## 2017-02-04 DIAGNOSIS — N186 End stage renal disease: Secondary | ICD-10-CM | POA: Diagnosis not present

## 2017-02-04 DIAGNOSIS — D631 Anemia in chronic kidney disease: Secondary | ICD-10-CM | POA: Diagnosis not present

## 2017-02-07 DIAGNOSIS — E1129 Type 2 diabetes mellitus with other diabetic kidney complication: Secondary | ICD-10-CM | POA: Diagnosis not present

## 2017-02-07 DIAGNOSIS — N186 End stage renal disease: Secondary | ICD-10-CM | POA: Diagnosis not present

## 2017-02-07 DIAGNOSIS — D631 Anemia in chronic kidney disease: Secondary | ICD-10-CM | POA: Diagnosis not present

## 2017-02-07 DIAGNOSIS — N2581 Secondary hyperparathyroidism of renal origin: Secondary | ICD-10-CM | POA: Diagnosis not present

## 2017-02-08 DIAGNOSIS — N186 End stage renal disease: Secondary | ICD-10-CM | POA: Diagnosis not present

## 2017-02-08 DIAGNOSIS — F329 Major depressive disorder, single episode, unspecified: Secondary | ICD-10-CM | POA: Diagnosis not present

## 2017-02-08 DIAGNOSIS — R2681 Unsteadiness on feet: Secondary | ICD-10-CM | POA: Diagnosis not present

## 2017-02-08 DIAGNOSIS — M6281 Muscle weakness (generalized): Secondary | ICD-10-CM | POA: Diagnosis not present

## 2017-02-08 DIAGNOSIS — D631 Anemia in chronic kidney disease: Secondary | ICD-10-CM | POA: Diagnosis not present

## 2017-02-08 DIAGNOSIS — E1122 Type 2 diabetes mellitus with diabetic chronic kidney disease: Secondary | ICD-10-CM | POA: Diagnosis not present

## 2017-02-08 DIAGNOSIS — E8770 Fluid overload, unspecified: Secondary | ICD-10-CM | POA: Diagnosis not present

## 2017-02-08 DIAGNOSIS — N2581 Secondary hyperparathyroidism of renal origin: Secondary | ICD-10-CM | POA: Diagnosis not present

## 2017-02-09 ENCOUNTER — Telehealth: Payer: Self-pay | Admitting: Cardiovascular Disease

## 2017-02-09 DIAGNOSIS — N2581 Secondary hyperparathyroidism of renal origin: Secondary | ICD-10-CM | POA: Diagnosis not present

## 2017-02-09 DIAGNOSIS — D631 Anemia in chronic kidney disease: Secondary | ICD-10-CM | POA: Diagnosis not present

## 2017-02-09 DIAGNOSIS — N186 End stage renal disease: Secondary | ICD-10-CM | POA: Diagnosis not present

## 2017-02-09 DIAGNOSIS — E1129 Type 2 diabetes mellitus with other diabetic kidney complication: Secondary | ICD-10-CM | POA: Diagnosis not present

## 2017-02-09 NOTE — Telephone Encounter (Signed)
Returned call to patient's daughter [name] she stated she needs help.Stated her father lives a [facility] assisted living.He goes to dialysis M-W-F.Stated Nanine Means needs a order to check B/P before he goes to dialysis and give Midodrine if B/P low.Stated Dialysis needs a order to check B/P before he leaves and give a Midodrine if B/P low.Stated dialysis does not administer medication.Patient would have to take Midodrine with him.Advised I spoke to Keddie.She will discuss with Dr.Mangham and call you back.

## 2017-02-09 NOTE — Telephone Encounter (Signed)
Alexander [name] is calling because they are having some problems with Mr. Taborda blood pressure medication (Midodrine 10mg ) . Please call

## 2017-02-10 NOTE — Telephone Encounter (Signed)
Spoke with dialysis center and patients systolic blood pressure on arrival is usual running 110's but drops to 70's during dialysis. Dialysis center ok with patient taking Midodrine during dialysis if blood pressure low. Order faxed to [facility] to take blood pressure prior to leaving for dialysis and if systolic blood pressure below 140 give Midodrine 10 mg and to also allow him to take to dialysis. Called and confirmed receipt of fax

## 2017-02-11 ENCOUNTER — Ambulatory Visit (INDEPENDENT_AMBULATORY_CARE_PROVIDER_SITE_OTHER): Payer: Medicare Other | Admitting: Pharmacist

## 2017-02-11 DIAGNOSIS — N186 End stage renal disease: Secondary | ICD-10-CM | POA: Diagnosis not present

## 2017-02-11 DIAGNOSIS — N2581 Secondary hyperparathyroidism of renal origin: Secondary | ICD-10-CM | POA: Diagnosis not present

## 2017-02-11 DIAGNOSIS — Z7901 Long term (current) use of anticoagulants: Secondary | ICD-10-CM | POA: Diagnosis not present

## 2017-02-11 DIAGNOSIS — I4891 Unspecified atrial fibrillation: Secondary | ICD-10-CM | POA: Diagnosis not present

## 2017-02-11 DIAGNOSIS — E1129 Type 2 diabetes mellitus with other diabetic kidney complication: Secondary | ICD-10-CM | POA: Diagnosis not present

## 2017-02-11 DIAGNOSIS — D631 Anemia in chronic kidney disease: Secondary | ICD-10-CM | POA: Diagnosis not present

## 2017-02-11 LAB — POCT INR: INR: 3.5

## 2017-02-12 DIAGNOSIS — M6281 Muscle weakness (generalized): Secondary | ICD-10-CM | POA: Diagnosis not present

## 2017-02-12 DIAGNOSIS — R2681 Unsteadiness on feet: Secondary | ICD-10-CM | POA: Diagnosis not present

## 2017-02-12 DIAGNOSIS — N186 End stage renal disease: Secondary | ICD-10-CM | POA: Diagnosis not present

## 2017-02-12 DIAGNOSIS — F329 Major depressive disorder, single episode, unspecified: Secondary | ICD-10-CM | POA: Diagnosis not present

## 2017-02-12 DIAGNOSIS — E1122 Type 2 diabetes mellitus with diabetic chronic kidney disease: Secondary | ICD-10-CM | POA: Diagnosis not present

## 2017-02-12 DIAGNOSIS — D631 Anemia in chronic kidney disease: Secondary | ICD-10-CM | POA: Diagnosis not present

## 2017-02-13 DIAGNOSIS — N2581 Secondary hyperparathyroidism of renal origin: Secondary | ICD-10-CM | POA: Diagnosis not present

## 2017-02-13 DIAGNOSIS — D631 Anemia in chronic kidney disease: Secondary | ICD-10-CM | POA: Diagnosis not present

## 2017-02-13 DIAGNOSIS — N186 End stage renal disease: Secondary | ICD-10-CM | POA: Diagnosis not present

## 2017-02-13 DIAGNOSIS — E1129 Type 2 diabetes mellitus with other diabetic kidney complication: Secondary | ICD-10-CM | POA: Diagnosis not present

## 2017-02-14 DIAGNOSIS — N62 Hypertrophy of breast: Secondary | ICD-10-CM | POA: Diagnosis not present

## 2017-02-14 DIAGNOSIS — Z992 Dependence on renal dialysis: Secondary | ICD-10-CM | POA: Diagnosis not present

## 2017-02-14 DIAGNOSIS — E221 Hyperprolactinemia: Secondary | ICD-10-CM | POA: Diagnosis not present

## 2017-02-14 DIAGNOSIS — J449 Chronic obstructive pulmonary disease, unspecified: Secondary | ICD-10-CM | POA: Diagnosis not present

## 2017-02-14 DIAGNOSIS — H547 Unspecified visual loss: Secondary | ICD-10-CM | POA: Diagnosis not present

## 2017-02-15 ENCOUNTER — Telehealth: Payer: Self-pay | Admitting: Cardiovascular Disease

## 2017-02-15 DIAGNOSIS — R2681 Unsteadiness on feet: Secondary | ICD-10-CM | POA: Diagnosis not present

## 2017-02-15 DIAGNOSIS — E1129 Type 2 diabetes mellitus with other diabetic kidney complication: Secondary | ICD-10-CM | POA: Diagnosis not present

## 2017-02-15 DIAGNOSIS — F329 Major depressive disorder, single episode, unspecified: Secondary | ICD-10-CM | POA: Diagnosis not present

## 2017-02-15 DIAGNOSIS — E1122 Type 2 diabetes mellitus with diabetic chronic kidney disease: Secondary | ICD-10-CM | POA: Diagnosis not present

## 2017-02-15 DIAGNOSIS — N2581 Secondary hyperparathyroidism of renal origin: Secondary | ICD-10-CM | POA: Diagnosis not present

## 2017-02-15 DIAGNOSIS — N186 End stage renal disease: Secondary | ICD-10-CM | POA: Diagnosis not present

## 2017-02-15 DIAGNOSIS — M6281 Muscle weakness (generalized): Secondary | ICD-10-CM | POA: Diagnosis not present

## 2017-02-15 DIAGNOSIS — D631 Anemia in chronic kidney disease: Secondary | ICD-10-CM | POA: Diagnosis not present

## 2017-02-15 NOTE — Telephone Encounter (Signed)
Left message to call back  

## 2017-02-15 NOTE — Telephone Encounter (Signed)
°  New Prob  Pt c/o BP issue: STAT if pt c/o blurred vision, one-sided weakness or slurred speech  1. What are your last 5 BP readings?  BP after dialysis today was 413 systolic. Med tech then took blood pressure again at 2:00 PM with a reading of 84/55 and pulse of 100   2. Are you having any other symptoms (ex. Dizziness, headache, blurred vision, passed out)?  No symptoms  3. What is your BP issue?  Does not have any parameters for low blood pressure readings.

## 2017-02-16 NOTE — Telephone Encounter (Signed)
[id number] 

## 2017-02-18 DIAGNOSIS — E1129 Type 2 diabetes mellitus with other diabetic kidney complication: Secondary | ICD-10-CM | POA: Diagnosis not present

## 2017-02-18 DIAGNOSIS — N2581 Secondary hyperparathyroidism of renal origin: Secondary | ICD-10-CM | POA: Diagnosis not present

## 2017-02-18 DIAGNOSIS — N186 End stage renal disease: Secondary | ICD-10-CM | POA: Diagnosis not present

## 2017-02-18 DIAGNOSIS — D631 Anemia in chronic kidney disease: Secondary | ICD-10-CM | POA: Diagnosis not present

## 2017-02-19 DIAGNOSIS — R2681 Unsteadiness on feet: Secondary | ICD-10-CM | POA: Diagnosis not present

## 2017-02-19 DIAGNOSIS — F329 Major depressive disorder, single episode, unspecified: Secondary | ICD-10-CM | POA: Diagnosis not present

## 2017-02-19 DIAGNOSIS — N186 End stage renal disease: Secondary | ICD-10-CM | POA: Diagnosis not present

## 2017-02-19 DIAGNOSIS — E1122 Type 2 diabetes mellitus with diabetic chronic kidney disease: Secondary | ICD-10-CM | POA: Diagnosis not present

## 2017-02-19 DIAGNOSIS — D631 Anemia in chronic kidney disease: Secondary | ICD-10-CM | POA: Diagnosis not present

## 2017-02-19 DIAGNOSIS — M6281 Muscle weakness (generalized): Secondary | ICD-10-CM | POA: Diagnosis not present

## 2017-02-20 LAB — CUP PACEART REMOTE DEVICE CHECK
Battery Voltage: 3.01 V
Brady Statistic AS VP Percent: 77 %
Brady Statistic AS VS Percent: 23 %
Brady Statistic RA Percent Paced: 1 %
Implantable Lead Implant Date: 20161213
Implantable Lead Location: 753860
Implantable Pulse Generator Implant Date: 20161213
Lead Channel Impedance Value: 450 Ohm
Lead Channel Pacing Threshold Pulse Width: 0.5 ms
Lead Channel Sensing Intrinsic Amplitude: 9 mV
Lead Channel Setting Pacing Amplitude: 1.75 V
Lead Channel Setting Pacing Amplitude: 2.5 V
Lead Channel Setting Pacing Pulse Width: 0.5 ms
Lead Channel Setting Sensing Sensitivity: 2 mV
MDC IDC LEAD IMPLANT DT: 20161213
MDC IDC LEAD LOCATION: 753859
MDC IDC MSMT BATTERY REMAINING LONGEVITY: 115 mo
MDC IDC MSMT BATTERY REMAINING PERCENTAGE: 95.5 %
MDC IDC MSMT LEADCHNL RA IMPEDANCE VALUE: 340 Ohm
MDC IDC MSMT LEADCHNL RA PACING THRESHOLD AMPLITUDE: 0.75 V
MDC IDC MSMT LEADCHNL RA PACING THRESHOLD PULSEWIDTH: 0.5 ms
MDC IDC MSMT LEADCHNL RA SENSING INTR AMPL: 1 mV
MDC IDC MSMT LEADCHNL RV PACING THRESHOLD AMPLITUDE: 0.75 V
MDC IDC PG SERIAL: 7839215
MDC IDC SESS DTM: 20181114192215
MDC IDC STAT BRADY AP VP PERCENT: 0 %
MDC IDC STAT BRADY AP VS PERCENT: 0 %
MDC IDC STAT BRADY RV PERCENT PACED: 18 %
Pulse Gen Model: 2240

## 2017-02-21 DIAGNOSIS — N186 End stage renal disease: Secondary | ICD-10-CM | POA: Diagnosis not present

## 2017-02-21 DIAGNOSIS — E1129 Type 2 diabetes mellitus with other diabetic kidney complication: Secondary | ICD-10-CM | POA: Diagnosis not present

## 2017-02-21 DIAGNOSIS — N2581 Secondary hyperparathyroidism of renal origin: Secondary | ICD-10-CM | POA: Diagnosis not present

## 2017-02-21 DIAGNOSIS — D631 Anemia in chronic kidney disease: Secondary | ICD-10-CM | POA: Diagnosis not present

## 2017-02-21 NOTE — Telephone Encounter (Signed)
Spoke with donna, today the patients bp is running 122/78. Over the last week his bp is fluctuating from the 150's to the 80's. They will continue to monitor and contact us if they have concerns.

## 2017-02-22 DIAGNOSIS — D631 Anemia in chronic kidney disease: Secondary | ICD-10-CM | POA: Diagnosis not present

## 2017-02-22 DIAGNOSIS — F329 Major depressive disorder, single episode, unspecified: Secondary | ICD-10-CM | POA: Diagnosis not present

## 2017-02-22 DIAGNOSIS — M6281 Muscle weakness (generalized): Secondary | ICD-10-CM | POA: Diagnosis not present

## 2017-02-22 DIAGNOSIS — R2681 Unsteadiness on feet: Secondary | ICD-10-CM | POA: Diagnosis not present

## 2017-02-22 DIAGNOSIS — E1122 Type 2 diabetes mellitus with diabetic chronic kidney disease: Secondary | ICD-10-CM | POA: Diagnosis not present

## 2017-02-22 DIAGNOSIS — N186 End stage renal disease: Secondary | ICD-10-CM | POA: Diagnosis not present

## 2017-02-23 DIAGNOSIS — D631 Anemia in chronic kidney disease: Secondary | ICD-10-CM | POA: Diagnosis not present

## 2017-02-23 DIAGNOSIS — I482 Chronic atrial fibrillation: Secondary | ICD-10-CM | POA: Diagnosis not present

## 2017-02-23 DIAGNOSIS — N186 End stage renal disease: Secondary | ICD-10-CM | POA: Diagnosis not present

## 2017-02-23 DIAGNOSIS — N2581 Secondary hyperparathyroidism of renal origin: Secondary | ICD-10-CM | POA: Diagnosis not present

## 2017-02-23 DIAGNOSIS — E1129 Type 2 diabetes mellitus with other diabetic kidney complication: Secondary | ICD-10-CM | POA: Diagnosis not present

## 2017-02-24 DIAGNOSIS — F329 Major depressive disorder, single episode, unspecified: Secondary | ICD-10-CM | POA: Diagnosis not present

## 2017-02-24 DIAGNOSIS — R2681 Unsteadiness on feet: Secondary | ICD-10-CM | POA: Diagnosis not present

## 2017-02-24 DIAGNOSIS — M6281 Muscle weakness (generalized): Secondary | ICD-10-CM | POA: Diagnosis not present

## 2017-02-24 DIAGNOSIS — D631 Anemia in chronic kidney disease: Secondary | ICD-10-CM | POA: Diagnosis not present

## 2017-02-24 DIAGNOSIS — N186 End stage renal disease: Secondary | ICD-10-CM | POA: Diagnosis not present

## 2017-02-24 DIAGNOSIS — E1122 Type 2 diabetes mellitus with diabetic chronic kidney disease: Secondary | ICD-10-CM | POA: Diagnosis not present

## 2017-02-25 DIAGNOSIS — D631 Anemia in chronic kidney disease: Secondary | ICD-10-CM | POA: Diagnosis not present

## 2017-02-25 DIAGNOSIS — Z992 Dependence on renal dialysis: Secondary | ICD-10-CM | POA: Diagnosis not present

## 2017-02-25 DIAGNOSIS — N2581 Secondary hyperparathyroidism of renal origin: Secondary | ICD-10-CM | POA: Diagnosis not present

## 2017-02-25 DIAGNOSIS — N186 End stage renal disease: Secondary | ICD-10-CM | POA: Diagnosis not present

## 2017-02-25 DIAGNOSIS — E1129 Type 2 diabetes mellitus with other diabetic kidney complication: Secondary | ICD-10-CM | POA: Diagnosis not present

## 2017-02-28 DIAGNOSIS — N186 End stage renal disease: Secondary | ICD-10-CM | POA: Diagnosis not present

## 2017-02-28 DIAGNOSIS — E1129 Type 2 diabetes mellitus with other diabetic kidney complication: Secondary | ICD-10-CM | POA: Diagnosis not present

## 2017-02-28 DIAGNOSIS — N2581 Secondary hyperparathyroidism of renal origin: Secondary | ICD-10-CM | POA: Diagnosis not present

## 2017-02-28 DIAGNOSIS — D631 Anemia in chronic kidney disease: Secondary | ICD-10-CM | POA: Diagnosis not present

## 2017-03-01 ENCOUNTER — Other Ambulatory Visit: Payer: Self-pay | Admitting: Licensed Clinical Social Worker

## 2017-03-01 ENCOUNTER — Ambulatory Visit (INDEPENDENT_AMBULATORY_CARE_PROVIDER_SITE_OTHER): Payer: Medicare Other | Admitting: Pharmacist

## 2017-03-01 DIAGNOSIS — Z7901 Long term (current) use of anticoagulants: Secondary | ICD-10-CM | POA: Diagnosis not present

## 2017-03-01 DIAGNOSIS — I4891 Unspecified atrial fibrillation: Secondary | ICD-10-CM | POA: Diagnosis not present

## 2017-03-01 LAB — POCT INR: INR: 3.1

## 2017-03-01 NOTE — Patient Outreach (Signed)
[facility]) Care Management  03/01/2017  Alexander [name] 04/19/1940 [id number]  Assessment-CSW completed outreach attempt today to patient's mobile number. CSW unable to reach patient successfully. CSW left a HIPPA compliant voice message encouraging patient to return call once available.  Plan-CSW will await return call or complete an additional outreach if needed within three weeks.  [name], BSW, MSW, [email or web address] Phone: [phone] Fax: [phone]

## 2017-03-02 DIAGNOSIS — E1129 Type 2 diabetes mellitus with other diabetic kidney complication: Secondary | ICD-10-CM | POA: Diagnosis not present

## 2017-03-02 DIAGNOSIS — N2581 Secondary hyperparathyroidism of renal origin: Secondary | ICD-10-CM | POA: Diagnosis not present

## 2017-03-02 DIAGNOSIS — N186 End stage renal disease: Secondary | ICD-10-CM | POA: Diagnosis not present

## 2017-03-02 DIAGNOSIS — D631 Anemia in chronic kidney disease: Secondary | ICD-10-CM | POA: Diagnosis not present

## 2017-03-04 DIAGNOSIS — D631 Anemia in chronic kidney disease: Secondary | ICD-10-CM | POA: Diagnosis not present

## 2017-03-04 DIAGNOSIS — E1129 Type 2 diabetes mellitus with other diabetic kidney complication: Secondary | ICD-10-CM | POA: Diagnosis not present

## 2017-03-04 DIAGNOSIS — N2581 Secondary hyperparathyroidism of renal origin: Secondary | ICD-10-CM | POA: Diagnosis not present

## 2017-03-04 DIAGNOSIS — N186 End stage renal disease: Secondary | ICD-10-CM | POA: Diagnosis not present

## 2017-03-07 ENCOUNTER — Other Ambulatory Visit: Payer: Self-pay | Admitting: Licensed Clinical Social Worker

## 2017-03-07 NOTE — Patient Outreach (Signed)
Alexander [name] Alexander [name]) Care Management  03/07/2017  Alexander [name] 23-Aug-1940 [id number]  Assessment-CSW completed second outreach attempt today. CSW unable to reach patient successfully. CSW left a HIPPA compliant voice message encouraging patient to return call once available.  Plan-CSW will await return call or complete an additional outreach if needed.  Alexander [name], Alexander [name], Alexander [name], [email or web address] Phone: [phone] Fax: [phone]

## 2017-03-09 DIAGNOSIS — N2581 Secondary hyperparathyroidism of renal origin: Secondary | ICD-10-CM | POA: Diagnosis not present

## 2017-03-09 DIAGNOSIS — D631 Anemia in chronic kidney disease: Secondary | ICD-10-CM | POA: Diagnosis not present

## 2017-03-09 DIAGNOSIS — N186 End stage renal disease: Secondary | ICD-10-CM | POA: Diagnosis not present

## 2017-03-09 DIAGNOSIS — E1129 Type 2 diabetes mellitus with other diabetic kidney complication: Secondary | ICD-10-CM | POA: Diagnosis not present

## 2017-03-10 ENCOUNTER — Other Ambulatory Visit: Payer: Self-pay | Admitting: Licensed Clinical Social Worker

## 2017-03-10 NOTE — Patient Outreach (Addendum)
[place] [facility]) Care Management  [facility] Social Work  03/10/2017  Alexander [name] 06/13/1940 [id number]  Encounter Medications:  Outpatient Encounter Medications as of 03/10/2017  Medication Sig  . acetaminophen (TYLENOL) 325 MG tablet Take 2 tablets (650 mg total) by mouth every 6 (six) hours as needed for mild pain.  [name] atorvastatin (LIPITOR) 40 MG tablet Take 20 mg by mouth daily.  [name] b complex-vitamin c-folic acid (NEPHRO-VITE) 0.8 MG TABS Take 1 tablet by mouth daily.   . bromocriptine (PARLODEL) 5 MG capsule Take 5 mg by mouth at bedtime.   . calcium acetate (PHOSLO) 667 MG capsule Take 667 mg by mouth 4 (four) times daily.   . cetirizine (ZYRTEC) 10 MG tablet Take 10 mg by mouth at bedtime.   . cinacalcet (SENSIPAR) 30 MG tablet Take 30 mg by mouth every evening.   . diltiazem (CARDIZEM) 30 MG tablet Take 1 tablet (30 mg total) by mouth every 12 (twelve) hours.  . dorzolamide-timolol (COSOPT) 22.3-6.8 MG/ML ophthalmic solution Place 1 drop into the right eye 2 (two) times daily.  . Menthol (ICY HOT) 5 % PTCH Apply topically. ON IN THE AM AND OFF IN THE PM FOR 10 DAYS  . methocarbamol (ROBAXIN) 500 MG tablet Take 1 tablet (500 mg total) by mouth every 6 (six) hours as needed for muscle spasms.  . metoprolol tartrate (LOPRESSOR) 50 MG tablet Take 1 tablet (50 mg total) by mouth 2 (two) times daily.  . midodrine (PROAMATINE) 10 MG tablet Take 10 mg by mouth as directed. TAKE 1 TABLET THREE TIMES A DAY ON  Monday, Wednesday, AND Friday AFTER DIALYSIS AS NEEDED FOR LOW BLOOD PRESSURE  . omeprazole (PRILOSEC) 20 MG capsule Take 20 mg by mouth daily.  [name] oxyCODONE-acetaminophen (ROXICET) 5-325 MG tablet Take 1 tablet by mouth every 8 (eight) hours as needed for severe pain.  . polyethylene glycol (MIRALAX / GLYCOLAX) packet Take 17 g by mouth daily.  . predniSONE (DELTASONE) 5 MG tablet Take 5 mg by mouth daily.   [name] senna-docusate (SENOKOT-S) 8.6-50 MG tablet Take 1 tablet by  mouth at bedtime.  . sertraline (ZOLOFT) 50 MG tablet Take 50 mg by mouth daily.  . Tiotropium Bromide-Olodaterol (STIOLTO RESPIMAT) 2.5-2.5 MCG/ACT AERS Inhale 2 puffs into the lungs daily. (Patient not taking: Reported on 01/29/2017)  . Tiotropium Bromide-Olodaterol (STIOLTO RESPIMAT) 2.5-2.5 MCG/ACT AERS Inhale 2 puffs into the lungs daily.  . Tiotropium Bromide-Olodaterol (STIOLTO RESPIMAT) 2.5-2.5 MCG/ACT AERS Inhale 2 puffs into the lungs daily. (Patient not taking: Reported on 01/29/2017)  . warfarin (COUMADIN) 5 MG tablet Take 1-1.5 tablets by mouth daily as directed by coumadin clinic (Patient taking differently: Take 5 mg by mouth See admin instructions. Take 1 tablet (11m totally) by mouth daily.)   No facility-administered encounter medications on file as of 03/10/2017.     Functional Status:  In your present state of health, do you have any difficulty performing the following activities: 11/24/2016 11/24/2016  Hearing? N N  Comment - -  Vision? Y -  Comment - -  Difficulty concentrating or making decisions? N -  Comment - -  Walking or climbing stairs? Y -  Comment - -  Dressing or bathing? N -  Comment - -  Doing errands, shopping? YWolfhurstand eating ? - -  Comment - -  Using the Toilet? - -  Comment - -  In the past six months, have you accidently leaked  urine? - -  Comment - -  Do you have problems with loss of bowel control? - -  Comment - -  Managing your Medications? - -  Comment - -  Managing your Finances? - -  Comment - -  Housekeeping or managing your Housekeeping? - -  Comment - -  Some recent data might be hidden    Fall/Depression Screening:  PHQ 2/9 Scores 12/03/2016 10/26/2016 10/04/2016  PHQ - 2 Score 0 0 -  Exception Documentation - - Other- indicate reason in comment box  Not completed - - call completed with dtr    Assessment: CSW completed routine home visit on 03/10/17 at [facility] facility. Patient was in his room  watching television when CSW arrived. Patient reports that he is very excited to give the news that he will be returning back home this weekend. Patient shares that his spouse's cancer treatment is now manageable enough for him to return home. He reports that his family will assisting with moving his belongings out of his private room to his residence. He shares that he will not have an aide but feels that he does not need one either. He reports walking with his walker at all times. Patient reports that he has been experiencing some back pain recently. He shares that he continues to struggle with SOB daily but is able to manage this appropriately. Patient denies any further social work needs and states not needing social work involvement once he has returned back home. Patient denies having any medication issues as well and declines the need for [place] involvement upon his return home. However, patient is agreeable to have [facility] RNCM back involved to assist with his nursing case management needs. CSW will complete referral back to [place] [facility] once patient discharges from [place].   [facility] CM Care Plan Problem One     Most Recent Value  Care Plan Problem One  SNF admission  Role Documenting the Problem One  Clinical Social Worker  Care Plan for Problem One  Active  [facility] Long Term Goal   Patient will have a safe and stable discharge back home from ALF within 90 days  [place] Term Goal Start Date  12/03/16  Interventions for Problem One Long Term Goal  Patient will be returning home on 03/12/17   [facility] CM Short Term Goal #1   Family will gain personal care resources and will explore them within 30 days  [facility] CM Short Term Goal #1 Start Date  12/03/16  [facility] CM Short Term Goal #1 Met Date  01/04/17  Interventions for Short Term Goal #1  Goal was met. Resources were reviewed and provided. However, resources will not be needed until patient returns home.  [facility] CM Short Term Goal #2   Patient will increase  socialization over the next 30 days  [facility] CM Short Term Goal #2 Start Date  02/01/17  [facility] CM Short Term Goal #2 Met Date  03/10/17  Interventions for Short Term Goal #2  Goal met. Patient has been able to walk down to the dining hall to enjoy lunch and dinner with other residents. Goal met.     Plan: CSW will make a referral for [facility] RNCM on 03/14/17 and then will discharge patient from caseload at that time.   [name], BSW, MSW, [email or web address] Phone: [phone] Fax: [phone]

## 2017-03-11 DIAGNOSIS — E1129 Type 2 diabetes mellitus with other diabetic kidney complication: Secondary | ICD-10-CM | POA: Diagnosis not present

## 2017-03-11 DIAGNOSIS — N2581 Secondary hyperparathyroidism of renal origin: Secondary | ICD-10-CM | POA: Diagnosis not present

## 2017-03-11 DIAGNOSIS — D631 Anemia in chronic kidney disease: Secondary | ICD-10-CM | POA: Diagnosis not present

## 2017-03-11 DIAGNOSIS — N186 End stage renal disease: Secondary | ICD-10-CM | POA: Diagnosis not present

## 2017-03-14 ENCOUNTER — Other Ambulatory Visit: Payer: Self-pay | Admitting: Licensed Clinical Social Worker

## 2017-03-14 ENCOUNTER — Encounter: Payer: Self-pay | Admitting: *Deleted

## 2017-03-14 ENCOUNTER — Other Ambulatory Visit: Payer: Self-pay | Admitting: *Deleted

## 2017-03-14 DIAGNOSIS — N2581 Secondary hyperparathyroidism of renal origin: Secondary | ICD-10-CM | POA: Diagnosis not present

## 2017-03-14 DIAGNOSIS — D631 Anemia in chronic kidney disease: Secondary | ICD-10-CM | POA: Diagnosis not present

## 2017-03-14 DIAGNOSIS — E1129 Type 2 diabetes mellitus with other diabetic kidney complication: Secondary | ICD-10-CM | POA: Diagnosis not present

## 2017-03-14 DIAGNOSIS — N186 End stage renal disease: Secondary | ICD-10-CM | POA: Diagnosis not present

## 2017-03-14 NOTE — Patient Outreach (Signed)
[place] [facility]) Care Management  03/14/2017  Alexander [name] Oct 17, 1940 [id number]  Assessment- CSW completed call to [facility] and confirmed with staff that patient discharged from facility back home. Patient denied any further social work needs and is agreeable to social work discharge. However, patient was interested in gaining [place] services again to assist with transition back home. CSW has already updated [facility] RNCM. CSW will complete order and make a referral for nursing.  Plan-CSW will complete social work discharge. CSW will send discipline closure letter to PCP.  Alexander [name], BSW, MSW, [name] Phone: [phone] Fax: [phone]

## 2017-03-14 NOTE — Patient Outreach (Addendum)
[place] [facility]) Care Management [place] Telephone Outreach, Transition of Care day 1  03/14/2017  Alexander [name] 10/23/1940 [id number]   4:10 pm:  Unsuccessful telephone outreach to Alexander [name], daughter/ caregiver; unable to leave voicemail message requesting call back, as automated outgoing voice messaging states that mail box is full.  4:15 pm:  Successful telephone outreach to Alexander [name], spouse and caregiver for Alexander [name] [id number] y.o.maleoriginally referred to Alexander [name] from [facility] telephonic RN CM on original MD referral. Patient has history including, but not limited to, PAD, Aortic stenosis with TAVR 08/31/16, ESRD on HD (M, W, F), A-Fib on coumadin, DM, CVA in 2004, CHF, macular degeneration with legal blindness, and multiple falls. Patient presented to hospital 11/23/16 for chest pain and general malaise, sepsis, and A-fib with RVR, and was discharged to SNF for rehabilitation.  Received notification from [facility] CSW that patient had discharged from SNF over weekend; HIPAA/ identity verified with patient's spouse today.  Today, patient's spouse states that patient "is doing okay" since his discharge home from the SNF; states that she is "very tired," as she (spouse) is currently receiving daily radiation.  Spouse states that she will be unable to talk to me for very long today, as she is exhausted after radiation today.  Spouse asks that I not contact daughter Alexander [name], as Alexander [name] is currently "under the weather."  Spouse denies that patient has pain, and states that he is unable to speak with me today, as he is resting after having had regularly scheduled HD this morning.  Patient's spouse further reports:  Medications: -- Has all medicationsand takes as prescribed, "as far as" she knows;  Reports that there were several changes made to patient's medications while he was at the SNF, and she doesn't know why these changes were made.  States that she is  giving medications 'the way he used to take them" prior to SNF visit -- declines medication reconciliation by phone today, stating that she "is just too tired." -- spouse reports she manages patient's medications by giving patient his medicine; denies that patient has issues with swallowing medications  -- given that spouse declines medication reconciliation today, I encouraged her to take all of his medications with him to scheduled cardiology office visit tomorrow, along with list from nursing home, which she states she will do.  Home health [facility]) services: -- spouse reports no home health services were ordered for patient post-SNF discharge  Provider appointments: -- All upcoming provider appointments were reviewed with patient today -- spouse reports she will drive patient to scheduled cardiology provider office visit tomorrow -- spouse reports patient continues attending regularly scheduled HD sessions M-W-F with [facility] continuing to provide transportation -- encouraged patient's spouse to schedule prompt PCP office visit for patient post-SNF discharge, and she said she "didn't know whether that will happen or not," dur to her daily radiation appointments and patient's established HD sessions three times/ week; stated, "we have a pretty heavy schedule right now."  Safety/ Mobility/ Falls: -- denies new/ recent falls since SNF discharge -- assistive devices: uses walker 100% of the time, per report of spouse -- general fall risks/ prevention education discussed with patient's spouse briefly today  Social/ Community Resource needs: -- currently denies community resource needs, stating supportive family members that assist with care needs as indicated -- family provides transportation for patient to all provider appointments, errands, etc  Patient's spouse denies further issues, concerns, or problems today, stating after 12  minutes that she "is too tired to keep talking."  I confirmed that  patient has my direct phone number, the main [facility] CM office phone number, and the [facility] CM 24-hour nurse advice phone number should issues arise prior to next scheduled [place] outreach by telephone next week.  Plan:  Patient will take medications as prescribed and will attend all scheduled provider appointments  Patient will continue using assistive devices for fall prevention  I will make patient's PCP aware of [facility] CM involvement in patient's care post-SNF discharge  [place] for ongoing transition of care to continue with scheduled telephone call nextweek.   [facility] CM Care Plan Problem One     Most Recent Value  Care Plan Problem One  Risk for hospital readmission related to recent discharge from SNF  Role Documenting the Problem One  Care Management [place] for Problem One  Active  [facility] Long Term Goal   Over the next 31 days, patient will not experience hospital readmission, as evidenced by patient reporting and review of EMR during [facility] RN CCM outreach  [facility] Long Term Goal Start Date  03/14/17  Interventions for Problem One Long Term Goal  Discussed with patient's spouse current clinical status, upcoming provider appointments, and current medications,  encouraged wife to take all medications to scheduled cardiology appointment tomorrow, and encouraged spouse to schedule post-SNF discharge office visit with PCP     I appreciate the opportunity to participate in [name]'s care,  [name], RN, BSN, [employer] Coordinator [facility] Care Management  [phone]

## 2017-03-15 ENCOUNTER — Encounter: Payer: Self-pay | Admitting: Cardiovascular Disease

## 2017-03-15 ENCOUNTER — Ambulatory Visit (INDEPENDENT_AMBULATORY_CARE_PROVIDER_SITE_OTHER): Payer: Medicare Other | Admitting: Pharmacist Clinician (PhC)/ Clinical Pharmacy Specialist

## 2017-03-15 ENCOUNTER — Ambulatory Visit (INDEPENDENT_AMBULATORY_CARE_PROVIDER_SITE_OTHER): Payer: Medicare Other | Admitting: Cardiovascular Disease

## 2017-03-15 VITALS — BP 94/60 | HR 105 | Ht 70.5 in | Wt 204.0 lb

## 2017-03-15 DIAGNOSIS — I4891 Unspecified atrial fibrillation: Secondary | ICD-10-CM

## 2017-03-15 DIAGNOSIS — I480 Paroxysmal atrial fibrillation: Secondary | ICD-10-CM

## 2017-03-15 DIAGNOSIS — Z7901 Long term (current) use of anticoagulants: Secondary | ICD-10-CM | POA: Diagnosis not present

## 2017-03-15 DIAGNOSIS — I481 Persistent atrial fibrillation: Secondary | ICD-10-CM | POA: Diagnosis not present

## 2017-03-15 DIAGNOSIS — I951 Orthostatic hypotension: Secondary | ICD-10-CM

## 2017-03-15 DIAGNOSIS — E7800 Pure hypercholesterolemia, unspecified: Secondary | ICD-10-CM | POA: Diagnosis not present

## 2017-03-15 DIAGNOSIS — I4811 Longstanding persistent atrial fibrillation: Secondary | ICD-10-CM

## 2017-03-15 DIAGNOSIS — Z952 Presence of prosthetic heart valve: Secondary | ICD-10-CM

## 2017-03-15 LAB — POCT INR: INR: 4.5

## 2017-03-15 NOTE — Progress Notes (Signed)
Cardiology Office Note   Date:  03/15/2017   ID:  JAYEN BROMWELL, DOB 1941/03/28, MRN [id number]  PCP:  Deland Pretty, MD  Cardiologist:   Skeet Latch, MD  Electrophysiologist: Allegra Lai, MD  No chief complaint on file.   History of Present Illness: Alexander [name] is a 76 y.o. male with severe aortic stenosis s/p TAVR, ESRD on HD, hypotension on midodrine, diabetes mellitus, hyperlipidemia, carotid artery disease with complete occlusion of the L carotid, prior TIA and paroxysmal atrial fibrillation  who presents for follow up.  He was initially evaluated on 12/04/14 with hypotension requiring midodrine and syncope.  He had an echo that revealed LVEF 60-65% with grade 2 diastolic dysfunction, moderate AS and mild MR. He wore a 48 hour Holter 11/2014 that revealed episodes of dizziness associated with heart rates in the 50s. He had a PPM implanted 01/2015.  He had a repeat monitor 12/2014 that showed atrial fibrillation with occasional junctional escape.  He subsequently had a Lexiscan Cardiolite 01/2015 that was negative for ischemia but showed an area of distal inferior infarct vs artifact.  He saw Dr. Curt Bears on 10/06/15 where he was found to have been in atrial fibrillation since April.  He went into sinus rhythm briefly after DCCV.  Alexander [name] had an echo 06/21/16 that revealed LVEF 60-65% and moderate aortic stenosis with a mean gradient of 33 mmHg.  He continued to complain of fatigue and dyspnea, so he had a TEE 07/22/16 that revealed normal systolic function and an aortic valve that appeared severely stenoitic, though gradients indicated moderate aortic stenosis.  Alexander [name] underwent placement of a 29 mm Sapien 3 TAVR on 08/2016.  He was subsequently admitted to [facility] with volume overload in the setting of confusion about midodrine and metoprolol  He inadvertently continue taking midodrine and stopped taking metoprolol.   He required extra dialysis. He subsequently  followed up with Dr. Angelena Form on 10/07/16 and was doing fairly well. He continued to struggle to get his dry weight regulated with dialysis.  Echo on 10/07/16 revealed LVEF 55-60% with mild LVH. The prostatic valve was functioning properly early without any significant stenosis or regurgitation. At his last appointment he reported significant fatigue and shortness of breath. He also reported orthopnea. He had a chest x-ray 11/11/16 that revealed mild cardiomegaly and tiny pleural effusions consistent with mild CHF. BNP was 18,000. I called his dialysis session and asked that they lower his dry weight to try using increase the amount of volume removed with each session.  Alexander [name] was admitted to the hospital 11/2016 with sepsis 2/2 Strep bacteremia.  The source was unclear, but presumed to be 2/2 HD access.  He had a CT of the spine that showed no evidence of discitis or abscess.  He also had a TEE 12/01/16 that revealed no vegetations on his PPM lead and normal TAVR valve function.  There was moderate TR and moderate MR. His hypotension resolved prior to discharge.  He was discharged to SNF given that his wife has been getting treatment for breast cancer.  He was able to go home last week.  He has been feeling generally well.  He only feels badly when his BP is <502 systolic.  His blood pressure has been dropping as low as the 80s during dialysis.  He notes that when his pressure starts out around 150 the session goes well.  It drops to around 2:20 hours and then to 110 just before  his session ends.  While living at [facility] he was eating more than usual.  They also tried to enforce him staying adequately hydrated.  He has noted increased lower extremity edema and dialysis has been working to get this volume off.  He denies orthopnea or PND and his breathing has been stable.  He has not noted any chest pain.  His only complaint today is sinus congestion that has been ongoing for some time.  He denies fever or  chills.   Past Medical History:  Diagnosis Date  . Anemia   . Aortic stenosis 06/15/12   TEE - EF 19-14%; grade 1 diastolic dysfunction; mild/mod aortic valve stenosis; Mitral valve had calcified annulus, mild pulm htn PA peak pressure 22mmHg  . Barrett's esophagus 05/2003  . Bradycardia 2017   [employer] 2240 Assurity dual-lead pacemaker  . Carpal tunnel syndrome, bilateral 11/03/2015  . Colon polyps   . CVA (cerebral infarction)    2004/affected left side  . Depression   . Diabetes mellitus without complication ([place])   . Diabetic peripheral neuropathy ([place]) 10/02/2015  . Diverticulosis   . Dyspnea    with exertion  . End stage renal disease ([place])    hemodialysis 3 times a week  . GERD (gastroesophageal reflux disease)   . Glaucoma   . Hyperlipidemia   . Hypertension   . Kidney stones   . Legally blind   . Macular degeneration    both eyes  . Orthostatic hypotension 09/09/2015  . Paroxysmal atrial fibrillation (H[facility])   . Peptic ulcer    bleeding, 1969  . Presence of permanent cardiac pacemaker   . S/P epidural steroid injection    last  injection over 10 years ago  . Seasonal allergies   . Tubular adenoma of colon 07/2001    Past Surgical History:  Procedure Laterality Date  . AV FISTULA PLACEMENT  2009  . BACK SURGERY    . CARDIOVERSION N/A 11/13/2015   Procedure: CARDIOVERSION;  Surgeon: Troy Sine, MD;  Location: [facility] ENDOSCOPY;  Service: Cardiovascular;  Laterality: N/A;  . CARDIOVERSION N/A 01/13/2016   Procedure: CARDIOVERSION;  Surgeon: Will Meredith [name], MD;  Location: [place];  Service: Cardiovascular;  Laterality: N/A;  . [place]   right eye  . CYSTOSCOPY  several times   kidney stones  . EP IMPLANTABLE DEVICE N/A 03/11/2015   Procedure: Pacemaker Implant;  Surgeon: Will Meredith [name], MD;  [place];  Laterality: Left  . LAMINECTOMY  1969  . RIGHT/LEFT HEART CATH AND CORONARY ANGIOGRAPHY N/A 08/20/2016    Procedure: Right/Left Heart Cath and Coronary Angiography;  Surgeon: Burnell Blanks, MD;  Location: [place] CV LAB;  Service: Cardiovascular;  Laterality: N/A;  . TEE WITHOUT CARDIOVERSION N/A 07/22/2016   Procedure: TRANSESOPHAGEAL ECHOCARDIOGRAM (TEE);  Surgeon: Skeet Latch, MD;  Location: [place];  Service: Cardiovascular;  Laterality: N/A;  . TEE WITHOUT CARDIOVERSION N/A 08/31/2016   Procedure: TRANSESOPHAGEAL ECHOCARDIOGRAM (TEE);  Surgeon: Burnell Blanks, MD;  Location: [place];  Service: Open Heart Surgery;  Laterality: N/A;  . TEE WITHOUT CARDIOVERSION N/A 12/01/2016   Procedure: TRANSESOPHAGEAL ECHOCARDIOGRAM (TEE);  Surgeon: Larey Dresser, MD;  Location: [facility] ENDOSCOPY;  Service: Cardiovascular;  Laterality: N/A;  . TONSILLECTOMY  1964  . TRANSCATHETER AORTIC VALVE REPLACEMENT, TRANSFEMORAL N/A 08/31/2016   Procedure: TRANSCATHETER AORTIC VALVE REPLACEMENT, TRANSFEMORAL;  Surgeon: Burnell Blanks, MD;  Location: [place];  Service: Open Heart Surgery;  Laterality: N/A;  Current Outpatient Medications  Medication Sig Dispense Refill  . acetaminophen (TYLENOL) 325 MG tablet Take 2 tablets (650 mg total) by mouth every 6 (six) hours as needed for mild pain.    [name] atorvastatin (LIPITOR) 40 MG tablet Take 20 mg by mouth daily.    [name] b complex-vitamin c-folic acid (NEPHRO-VITE) 0.8 MG TABS Take 1 tablet by mouth daily.     . bromocriptine (PARLODEL) 5 MG capsule Take 5 mg by mouth at bedtime.     . calcium acetate (PHOSLO) 667 MG capsule Take 667 mg by mouth 4 (four) times daily.     . cetirizine (ZYRTEC) 10 MG tablet Take 10 mg by mouth at bedtime.     . cinacalcet (SENSIPAR) 30 MG tablet Take 30 mg by mouth every evening.     . diltiazem (CARDIZEM) 30 MG tablet Take 1 tablet (30 mg total) by mouth every 12 (twelve) hours. 60 tablet 0  . dorzolamide-timolol (COSOPT) 22.3-6.8 MG/ML ophthalmic solution Place 1 drop into the right eye 2 (two) times daily.    .  methocarbamol (ROBAXIN) 500 MG tablet Take 1 tablet (500 mg total) by mouth every 6 (six) hours as needed for muscle spasms. 10 tablet 0  . metoprolol tartrate (LOPRESSOR) 50 MG tablet Take 1 tablet (50 mg total) by mouth 2 (two) times daily. 60 tablet 0  . midodrine (PROAMATINE) 10 MG tablet Take 10 mg by mouth as directed. TAKE 1 TABLET PRIOR TO DIALYSIS. OK TO TAKE EXTRA 1/2 TAB FOR LOW BP PRIOR TO DIALYSIS. OK TO TAKE 15 MG AS NEEDED FOR LOW BP DURING DIALYSIS     . Nutritional Supplements (NEPRO) LIQD Take 237 mLs by mouth daily.    [name] omeprazole (PRILOSEC) 20 MG capsule Take 20 mg by mouth daily.    [name] oxyCODONE-acetaminophen (ROXICET) 5-325 MG tablet Take 1 tablet by mouth every 8 (eight) hours as needed for severe pain. 5 tablet 0  . polyethylene glycol (MIRALAX / GLYCOLAX) packet Take 17 g by mouth daily. 14 each 0  . predniSONE (DELTASONE) 5 MG tablet Take 5 mg by mouth daily.   1  . senna-docusate (SENOKOT-S) 8.6-50 MG tablet Take 1 tablet by mouth at bedtime. 14 tablet 0  . sertraline (ZOLOFT) 50 MG tablet Take 50 mg by mouth daily.  5  . Tiotropium Bromide-Olodaterol (STIOLTO RESPIMAT) 2.5-2.5 MCG/ACT AERS Inhale 2 puffs into the lungs daily. 1 Inhaler 5  . warfarin (COUMADIN) 5 MG tablet Take 1-1.5 tablets by mouth daily as directed by coumadin clinic (Patient taking differently: Take 5 mg by mouth See admin instructions. Take 1 tablet (5mg  totally) by mouth daily.) 120 tablet 1  . Tiotropium Bromide-Olodaterol (STIOLTO RESPIMAT) 2.5-2.5 MCG/ACT AERS Inhale 2 puffs into the lungs daily. (Patient not taking: Reported on 01/29/2017) 1 Inhaler 0   No current facility-administered medications for this visit.     Allergies:   Penicillins; Codeine; and Tramadol    Social History:  The patient  reports that he quit smoking about 19 years ago. His smoking use included cigarettes. He has a 90.00 pack-year smoking history. he has never used smokeless tobacco. He reports that he does not drink  alcohol or use drugs.   Family History:  The patient's family history includes Cancer in his brother; Heart attack in his father; Heart disease in his sister; Hypertension in his father; Stomach cancer in his mother; Stroke in his sister.    ROS:  Please see the history of present illness.  Otherwise, review of systems are positive for none.   All other systems are reviewed and negative.    PHYSICAL EXAM: .VS:  BP 94/60   Pulse (!) 105   Ht 5' 10.5" (1.791 m)   Wt 204 lb (92.5 kg)   BMI 28.86 kg/m  , BMI Body mass index is 28.86 kg/m. GENERAL:  Chronically ill-appearing HEENT: Pupils equal round and reactive, fundi not visualized, oral mucosa unremarkable NECK:  JVP 2 cm above the clavicle at 45 degrees. Waveform within normal limits, carotid upstroke brisk and symmetric, no bruits, no thyromegaly LUNGS:  Clear to auscultation bilaterally HEART:  RRR.  PMI not displaced or sustained,S1 and S2 within normal limits, no S3, no S4, no clicks, no rubs, no murmurs ABD:  Flat, positive bowel sounds normal in frequency in pitch, no bruits, no rebound, no guarding, no midline pulsatile mass, no hepatomegaly, no splenomegaly EXT:  2 plus pulses throughout, no edema, no cyanosis no clubbing SKIN:  No rashes no nodules NEURO:  Cranial nerves II through XII grossly intact, motor grossly intact throughout PSYCH:  Cognitively intact, oriented to person place and time   EKG:  EKG is ordered today. 09/09/15: Atrial fibrillation rate 108 bpm.  Prior inferior infarct. 01/16/15: Sinus bradycardia.  First degree heart block.  LAFB.  U waves. The ekg from 11/19/14 demonstrates sinus bradycardia at 49 bpm. First degree heart block.  08/06/16: Atrial fibrillation.  Rate 82 bpm.  Occcasional VP. 11/11/16: Atrial fibrillation. Rate 97 bpm. Left bundle branch block.  03/15/17: Atrial fibrillation  Rate 105 bpm.  LBBB.  PVC  Lexiscan Cardiolite 01/28/15:  Nuclear stress EF: 67%.  The left ventricular  ejection fraction is hyperdynamic (>65%).  There was no ST segment deviation noted during stress.  This is a low risk study.  Low risk stress nuclear study with a small, severe, predominantly fixed distal inferior defect consistent with small prior infarct vs thinning; no significant ischemia; EF 67 with normal wall motion.  Echo 10/07/16: Study Conclusions  - Left ventricle: The cavity size was normal. Wall thickness was   increased in a pattern of mild LVH. Systolic function was normal.   The estimated ejection fraction was in the range of 55% to 60%.   Wall motion was normal; there were no regional wall motion   abnormalities. Doppler parameters are consistent with high   ventricular filling pressure. - Aortic valve: A bioprosthesis was present. - Mitral valve: Calcified annulus. There was mild regurgitation.   Valve area by pressure half-time: 2.08 cm^2. - Left atrium: The atrium was mildly dilated. - Right atrium: The atrium was moderately dilated. - Pulmonary arteries: Systolic pressure was mildly increased. PA   peak pressure: 36 mm Hg (S).  Impressions:  - Normal LV systolic function; mild LVH; elevated LV filling   pressure; s/p AVR with normal mean gradient (9 mmHg) and   calculated AVA of 1.4 cm2; no AI; mild MR; biatrial enlargement;   mild TR with mildly elevated pulmonary pressure.  Recent Labs: 11/11/2016: NT-Pro BNP 18,028 11/23/2016: Magnesium 1.5 01/29/2017: ALT 19; BUN 22; Creatinine, Ser 4.91; Hemoglobin 9.7; Platelets 160; Potassium 3.7; Sodium 141    Lipid Panel    Component Value Date/Time   CHOL 138 10/28/2015 1008   TRIG 158 (H) 10/28/2015 1008   HDL 30 (L) 10/28/2015 1008   CHOLHDL 4.6 10/28/2015 1008   VLDL 32 (H) 10/28/2015 1008   LDLCALC 76 10/28/2015 1008      Wt Readings from Last  3 Encounters:  03/15/17 204 lb (92.5 kg)  01/27/17 196 lb 6 oz (89.1 kg)  12/30/16 191 lb 3.2 oz (86.7 kg)     Other studies Reviewed: Additional  studies/ records that were reviewed today include: [facility] records Review of the above records demonstrates:  Please see elsewhere in the note.     ASSESSMENT AND PLAN:  # Severe aortic stenosis s/p TAVR:  # Shortness of breath: TAVR is functioning properly.  Symptoms have improved and TAVR looked good on echo 11/2016.  No changes.    # Hypotension/syncope: No recent syncope, but BP is dropping during HD.  He has not had any high blood pressures.  He will start taking Midrin 10 mg daily on Monday Wednesday Friday prior to going to dialysis.  If his blood pressure is less than 941 systolic then he will take an additional 5 mg for a total of 1015 mg.  He is also free to take an additional 15 mg of Midrin during his dialysis session if his blood pressure drops.  He typically does this when he gets below 100.  Remains in atrial fibrillation today.  # Bradycardia/longstanding persistent atrial fibrillation:  Alexander [name] had a cardioversion but only went into sinus rhythm temporarily.  He was on amiodarone at the time.  He his heart rate is slightly elevated but he is asymptomatic.  We will not increase his metoprolol due to his hypotension.  I suspect that this will improve as his volume status improves. Continue warfarin for anticoagulation.   # Hyperlipidemia: LDL 76 10/2015.  Continue atorvastatin.  Repeat lipids and CMP.   Current medicines are reviewed at length with the patient today.  The patient does not have concerns regarding medicines.  The following changes have been made: None  Labs/ tests ordered today include:   Orders Placed This Encounter  Procedures  . Lipid panel  . EKG 12-Lead     Disposition:   FU with Dr. Jonelle Sidle C. Perkins in 1 months   Signed, Skeet Latch, MD  03/15/2017 10:23 AM    [place]

## 2017-03-15 NOTE — Patient Instructions (Addendum)
Medication Instructions:  TAKE MIDODRINE 10 MG EVERY Monday, Wednesday, AND Friday PRIOR TO DIALYSIS. IF YOUR BLOOD PRESSURE IS LESS THAN 120 TAKE AN ADDITIONAL 5 MG. IF YOUR BLOOD PRESSURE DROPS DURING DIALYSIS TAKE 15 MG AS NEEDED  Labwork: FASTING LIPID SOON   Testing/Procedures: NONE  Follow-Up: Your physician recommends that you schedule a follow-up appointment in: [place]   If you need a refill on your cardiac medications before your next appointment, please call your pharmacy.

## 2017-03-16 DIAGNOSIS — E1129 Type 2 diabetes mellitus with other diabetic kidney complication: Secondary | ICD-10-CM | POA: Diagnosis not present

## 2017-03-16 DIAGNOSIS — N186 End stage renal disease: Secondary | ICD-10-CM | POA: Diagnosis not present

## 2017-03-16 DIAGNOSIS — N2581 Secondary hyperparathyroidism of renal origin: Secondary | ICD-10-CM | POA: Diagnosis not present

## 2017-03-16 DIAGNOSIS — D631 Anemia in chronic kidney disease: Secondary | ICD-10-CM | POA: Diagnosis not present

## 2017-03-18 ENCOUNTER — Encounter: Payer: Self-pay | Admitting: *Deleted

## 2017-03-18 ENCOUNTER — Other Ambulatory Visit: Payer: Self-pay | Admitting: *Deleted

## 2017-03-18 DIAGNOSIS — E1129 Type 2 diabetes mellitus with other diabetic kidney complication: Secondary | ICD-10-CM | POA: Diagnosis not present

## 2017-03-18 DIAGNOSIS — N186 End stage renal disease: Secondary | ICD-10-CM | POA: Diagnosis not present

## 2017-03-18 DIAGNOSIS — D631 Anemia in chronic kidney disease: Secondary | ICD-10-CM | POA: Diagnosis not present

## 2017-03-18 DIAGNOSIS — N2581 Secondary hyperparathyroidism of renal origin: Secondary | ICD-10-CM | POA: Diagnosis not present

## 2017-03-18 NOTE — Patient Outreach (Signed)
[facility] [facility]) Care Management [place] Telephone Outreach, Transition of Care day 5  03/18/2017  DALLYN BERGLAND 1940-06-12 [id number]  Received off-hour voicemail message from [name], daughter/ caregiver, requesting call back from my cal to her, placed earlier this week, re: Ersel Enslin Cupo,76 y.o.maleoriginally referred to [place] from [facility] telephonic RN CM on original MD referral. Patient has history including, but not limited to, PAD, Aortic stenosis with TAVR 08/31/16, ESRD on HD (M, W, F), A-Fib on coumadin, DM, CVA in 2004, CHF, macular degeneration with legal blindness, and multiple falls. Patient presented to hospital 11/23/16 for chest pain and general malaise,sepsis, and A-fib with RVR, and was discharged to SNF for rehabilitation.  Patient discharged from SNF on Saturday 03/12/17.  Returned caregiver's call today; HIPAA compliant voice mail message left for patient, requesting return call back.  Plan:  Will re-attempt [facility] CM telephone outreach next week if I do not hear back from patient/ caregiver first.  [name], RN, BSN, [place] Coordinator [facility] Care Management  [phone]

## 2017-03-20 DIAGNOSIS — D631 Anemia in chronic kidney disease: Secondary | ICD-10-CM | POA: Diagnosis not present

## 2017-03-20 DIAGNOSIS — N2581 Secondary hyperparathyroidism of renal origin: Secondary | ICD-10-CM | POA: Diagnosis not present

## 2017-03-20 DIAGNOSIS — E1129 Type 2 diabetes mellitus with other diabetic kidney complication: Secondary | ICD-10-CM | POA: Diagnosis not present

## 2017-03-20 DIAGNOSIS — N186 End stage renal disease: Secondary | ICD-10-CM | POA: Diagnosis not present

## 2017-03-21 DIAGNOSIS — N186 End stage renal disease: Secondary | ICD-10-CM | POA: Insufficient documentation

## 2017-03-21 DIAGNOSIS — K635 Polyp of colon: Secondary | ICD-10-CM | POA: Insufficient documentation

## 2017-03-21 DIAGNOSIS — D649 Anemia, unspecified: Secondary | ICD-10-CM | POA: Insufficient documentation

## 2017-03-21 DIAGNOSIS — R0600 Dyspnea, unspecified: Secondary | ICD-10-CM | POA: Insufficient documentation

## 2017-03-21 DIAGNOSIS — K5790 Diverticulosis of intestine, part unspecified, without perforation or abscess without bleeding: Secondary | ICD-10-CM | POA: Insufficient documentation

## 2017-03-21 DIAGNOSIS — J302 Other seasonal allergic rhinitis: Secondary | ICD-10-CM | POA: Insufficient documentation

## 2017-03-21 DIAGNOSIS — K279 Peptic ulcer, site unspecified, unspecified as acute or chronic, without hemorrhage or perforation: Secondary | ICD-10-CM | POA: Insufficient documentation

## 2017-03-21 DIAGNOSIS — I480 Paroxysmal atrial fibrillation: Secondary | ICD-10-CM | POA: Insufficient documentation

## 2017-03-21 DIAGNOSIS — E119 Type 2 diabetes mellitus without complications: Secondary | ICD-10-CM | POA: Insufficient documentation

## 2017-03-21 DIAGNOSIS — H548 Legal blindness, as defined in USA: Secondary | ICD-10-CM | POA: Insufficient documentation

## 2017-03-21 DIAGNOSIS — Z92241 Personal history of systemic steroid therapy: Secondary | ICD-10-CM | POA: Insufficient documentation

## 2017-03-21 DIAGNOSIS — N200 Calculus of kidney: Secondary | ICD-10-CM | POA: Insufficient documentation

## 2017-03-21 DIAGNOSIS — I10 Essential (primary) hypertension: Secondary | ICD-10-CM | POA: Insufficient documentation

## 2017-03-21 DIAGNOSIS — H3530 Unspecified macular degeneration: Secondary | ICD-10-CM | POA: Insufficient documentation

## 2017-03-23 ENCOUNTER — Other Ambulatory Visit: Payer: Self-pay | Admitting: *Deleted

## 2017-03-23 DIAGNOSIS — N2581 Secondary hyperparathyroidism of renal origin: Secondary | ICD-10-CM | POA: Diagnosis not present

## 2017-03-23 DIAGNOSIS — D631 Anemia in chronic kidney disease: Secondary | ICD-10-CM | POA: Diagnosis not present

## 2017-03-23 DIAGNOSIS — E1129 Type 2 diabetes mellitus with other diabetic kidney complication: Secondary | ICD-10-CM | POA: Diagnosis not present

## 2017-03-23 DIAGNOSIS — N186 End stage renal disease: Secondary | ICD-10-CM | POA: Diagnosis not present
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
## 2017-03-25 ENCOUNTER — Ambulatory Visit (INDEPENDENT_AMBULATORY_CARE_PROVIDER_SITE_OTHER): Payer: Medicare Other | Admitting: Pharmacist

## 2017-03-25 DIAGNOSIS — I4891 Unspecified atrial fibrillation: Secondary | ICD-10-CM

## 2017-03-25 DIAGNOSIS — Z7901 Long term (current) use of anticoagulants: Secondary | ICD-10-CM

## 2017-03-25 DIAGNOSIS — E1129 Type 2 diabetes mellitus with other diabetic kidney complication: Secondary | ICD-10-CM | POA: Diagnosis not present

## 2017-03-25 DIAGNOSIS — N186 End stage renal disease: Secondary | ICD-10-CM | POA: Diagnosis not present

## 2017-03-25 DIAGNOSIS — N2581 Secondary hyperparathyroidism of renal origin: Secondary | ICD-10-CM | POA: Diagnosis not present

## 2017-03-25 DIAGNOSIS — D631 Anemia in chronic kidney disease: Secondary | ICD-10-CM | POA: Diagnosis not present

## 2017-03-25 LAB — POCT INR: INR: 3.6

## 2017-03-27 DIAGNOSIS — E1129 Type 2 diabetes mellitus with other diabetic kidney complication: Secondary | ICD-10-CM | POA: Diagnosis not present

## 2017-03-27 DIAGNOSIS — D631 Anemia in chronic kidney disease: Secondary | ICD-10-CM | POA: Diagnosis not present

## 2017-03-27 DIAGNOSIS — N186 End stage renal disease: Secondary | ICD-10-CM | POA: Diagnosis not present

## 2017-03-27 DIAGNOSIS — N2581 Secondary hyperparathyroidism of renal origin: Secondary | ICD-10-CM | POA: Diagnosis not present

## 2017-03-28 DIAGNOSIS — Z992 Dependence on renal dialysis: Secondary | ICD-10-CM | POA: Diagnosis not present

## 2017-03-28 DIAGNOSIS — N186 End stage renal disease: Secondary | ICD-10-CM | POA: Diagnosis not present

## 2017-03-28 DIAGNOSIS — E1129 Type 2 diabetes mellitus with other diabetic kidney complication: Secondary | ICD-10-CM | POA: Diagnosis not present

## 2017-03-30 ENCOUNTER — Other Ambulatory Visit: Payer: Self-pay | Admitting: *Deleted

## 2017-03-30 ENCOUNTER — Encounter: Payer: Self-pay | Admitting: *Deleted

## 2017-03-30 DIAGNOSIS — N186 End stage renal disease: Secondary | ICD-10-CM | POA: Diagnosis not present

## 2017-03-30 DIAGNOSIS — N2581 Secondary hyperparathyroidism of renal origin: Secondary | ICD-10-CM | POA: Diagnosis not present

## 2017-03-30 DIAGNOSIS — E1129 Type 2 diabetes mellitus with other diabetic kidney complication: Secondary | ICD-10-CM | POA: Diagnosis not present

## 2017-03-30 DIAGNOSIS — D631 Anemia in chronic kidney disease: Secondary | ICD-10-CM | POA: Diagnosis not present

## 2017-03-30 DIAGNOSIS — Z23 Encounter for immunization: Secondary | ICD-10-CM | POA: Diagnosis not present
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
## 2017-04-01 DIAGNOSIS — N186 End stage renal disease: Secondary | ICD-10-CM | POA: Diagnosis not present

## 2017-04-01 DIAGNOSIS — E1129 Type 2 diabetes mellitus with other diabetic kidney complication: Secondary | ICD-10-CM | POA: Diagnosis not present

## 2017-04-01 DIAGNOSIS — N2581 Secondary hyperparathyroidism of renal origin: Secondary | ICD-10-CM | POA: Diagnosis not present

## 2017-04-01 DIAGNOSIS — D631 Anemia in chronic kidney disease: Secondary | ICD-10-CM | POA: Diagnosis not present

## 2017-04-01 DIAGNOSIS — Z23 Encounter for immunization: Secondary | ICD-10-CM | POA: Diagnosis not present

## 2017-04-04 DIAGNOSIS — N186 End stage renal disease: Secondary | ICD-10-CM | POA: Diagnosis not present

## 2017-04-04 DIAGNOSIS — Z23 Encounter for immunization: Secondary | ICD-10-CM | POA: Diagnosis not present

## 2017-04-04 DIAGNOSIS — E1129 Type 2 diabetes mellitus with other diabetic kidney complication: Secondary | ICD-10-CM | POA: Diagnosis not present

## 2017-04-04 DIAGNOSIS — N2581 Secondary hyperparathyroidism of renal origin: Secondary | ICD-10-CM | POA: Diagnosis not present

## 2017-04-04 DIAGNOSIS — D631 Anemia in chronic kidney disease: Secondary | ICD-10-CM | POA: Diagnosis not present

## 2017-04-06 DIAGNOSIS — N186 End stage renal disease: Secondary | ICD-10-CM | POA: Diagnosis not present

## 2017-04-06 DIAGNOSIS — N2581 Secondary hyperparathyroidism of renal origin: Secondary | ICD-10-CM | POA: Diagnosis not present

## 2017-04-06 DIAGNOSIS — E1129 Type 2 diabetes mellitus with other diabetic kidney complication: Secondary | ICD-10-CM | POA: Diagnosis not present

## 2017-04-06 DIAGNOSIS — D631 Anemia in chronic kidney disease: Secondary | ICD-10-CM | POA: Diagnosis not present

## 2017-04-06 DIAGNOSIS — Z23 Encounter for immunization: Secondary | ICD-10-CM | POA: Diagnosis not present

## 2017-04-07 ENCOUNTER — Other Ambulatory Visit: Payer: Self-pay | Admitting: *Deleted

## 2017-04-07 ENCOUNTER — Encounter: Payer: Self-pay | Admitting: *Deleted

## 2017-04-07 NOTE — Patient Outreach (Signed)
[place] [facility]) Care Management  [place] Initial Home Visit, Transition of Care day 25  04/07/2017  ATA PECHA [birth year] [id number]  Alexander [name] is a 77 y.o. male originallyreferred to [place] from [facility] telephonic RN CM on original MD referral. Patient has history including, but not limited to, PAD, Aortic stenosis with TAVR 08/31/16, ESRD on HD (M, W, F), A-Fib on coumadin, DM, CVA in 2004, CHF, macular degeneration with legal blindness, and multiple falls. Patient presented to hospital 11/23/16 for chest pain and general malaise,sepsis, and A-fib with RVR, and was discharged to SNF for rehabilitation.Patient was subsequently discharged home from [facility] March 11, 2017 to self-care without home health services in place.  HIPAA/ identity verified with patient in person at his home today, and his caregivers wifeFrankie and daughter Alexander [name] are also present for visit today and actively participate in aspects of home visit.  Pleasant 70 minute home visit today.  Today, patient'sdaughter reportsthat patient's wife is "going for her last radiation treatment" this afternoon at 1:00 pm; reports limited time for home visit around Mrs. Moore's radiation treatment this afternoon.  Patient states that he "has been doing just fine," since discharge home from SNF; reports he is "feeling good today" after taking tylenol and applying "icy hot patch" for chronic (R) flank pain.  Patient reports that he "had abad morning with the pain," but states the medication he took helped, and he denies pain during time of home visit.  Patient is no apparent/ obvious distress throughout entirety of today's home visit.   Subjective:  "I think I am doing pretty good since I finally got home; I think I might benefit from some physical therapy, but we are too overwhelmed with all of our doctors appointments and my HD to go out for it-- they would have to come here to me for it to  work."  Assessment:  Alexander [name] appears to be recuperating well since his discharge from the SNF in December 2018; patient remains a high fall risk due to legal blindness and relatively new issues around low blood pressures, especially with HD sessions.  Patient is interested in possibility of having home health PT to improve his mobility, chronic pain, and strength/ stamina.  Patient and his caregivers continue to deny community resource needs.  Patient remains committed to adherence with attending established HD sessions and provider appointments and with medications.   Patient and caregiversfurther reports:  Medications: -- Has all medicationsand takes as prescribed,denies questions/ concerns around patient's medications today -- patient's spouse continues preparing medications using weekly pill planner box; patient takes independently  -- denies issues with swallowing medications -- Patient was recently discharged from SNF and all medications were thoroughly reviewed with patient and caregivers today.  Encouraged patient and caregiver to discuss all medications, including pain management medications with PCP at upcoming appointment  Provider appointments: -- All upcoming provider appointments were reviewed with patient and caregiverstoday --spouse or daughterwillcontinue to transportpatient to scheduled provider office visits -- confirmed that patient's daughterhas scheduled PCP office visit for patient post-SNF discharge; daughter reports she has scheduled "medication review" appointment at PCP office prior to provider appointment -- continues attending scheduled HD sessions on M-W-F, by taking [facility] bus to HD sessions, with patient's wife picking him up from HD sessions.  Safety/ Mobility/ Falls: -- denies new falls since discharge home from SNF -- patient uses walker "all the time," has wheelchair available in home for use as indicated/ needed --  patient's gait is steady,  slow, and purposeful with use of rolling walker in home -- no obvious fall risks/ hazards noted in patient's home environment today -- patient endorses that while he was at SNF he received "regular" PT therapy; reports that he would like to continue PT, but that with his ongoing HD treatments and his wife's health concerns, he is unable to go to outpatient PT; states that he would appreciate home health PT; discussed that I would make this request from patient's PCP  Self-health management if chronic disease state of A-Fib/ CKD requiring HD: -- patient has continued monitoring and recording daily BP's around recent decreased BP's with HD sessions; reports no concerns, confirms that patient continues taking midrodine with HD sessions, as prescribed; states his blood pressure "normally run 100-110/ 60's" when he checks them -- reports "always" in A-Fib; states he can not tell when he is in A-Fib; reports heart rate "is under control" with medications; attends coumadin clinic for INR testing "regularly;"  Reports next INR check "tomorrow;" takes ACT as prescribed -- patient continues following renal diet   Patient and caregiversdeny further issues, concerns, or problems today, and state they need to depart the home for patient's spouse radiation therapy.I confirmed that patient/ caregivers havemy direct phone number, the main [facility] CM office phone number, and the [facility] CM 24-hour nurse advice phone number should issues arise prior to next scheduled [place] scheduled telephone call next week.   Objective:    BP 104/66   Pulse 80   Resp 16   SpO2 96%   Review of Systems  Constitutional: Negative.   Eyes:       Reports "legally blind" for "many years"  Respiratory: Negative.  Negative for cough, shortness of breath and wheezing.   Cardiovascular: Negative for chest pain, palpitations and leg swelling.       Irregular pulse; controlled rate  Gastrointestinal: Negative.   Negative for abdominal pain, nausea and vomiting.  Genitourinary: Negative.        Continues attending established HD sessions M-W-F  Musculoskeletal: Positive for myalgias. Negative for falls.       Chronic (R) flank pain "goes all down my body at times;" unsure of nature/ cause of pain; states "the muscle relaxer's they gave me at the SNF helped the pain."  Currently uses tylenol and icy hot for pain control, reports, "works pretty good most of the time."   Neurological: Negative.   Psychiatric/Behavioral: Negative for depression. The patient is not nervous/anxious.    Physical Exam  Constitutional: He is oriented to person, place, and time. He appears well-developed and well-nourished. No distress.  Cardiovascular: Normal rate, normal heart sounds and intact distal pulses. An irregular rhythm present.  Pulses:      Radial pulses are 2+ on the right side, and 2+ on the left side.  Respiratory: Effort normal and breath sounds normal. No respiratory distress. He has no wheezes. He has no rales.  Bilateral breath sounds clear but coarse x A/L/P auscultation  GI: Soft. Bowel sounds are normal.  Musculoskeletal: He exhibits no edema.  Neurological: He is alert and oriented to person, place, and time.  Skin: Skin is warm and dry. No erythema.  Psychiatric: He has a normal mood and affect. His behavior is normal. Judgment and thought content normal.   Encounter Medications:   Outpatient Encounter Medications as of 04/07/2017  Medication Sig Note  . acetaminophen (TYLENOL) 325 MG tablet Take 2 tablets (650 mg total)  by mouth every 6 (six) hours as needed for mild pain.   [name] atorvastatin (LIPITOR) 40 MG tablet Take 20 mg by mouth daily.   [name] b complex-vitamin c-folic acid (NEPHRO-VITE) 0.8 MG TABS Take 1 tablet by mouth daily.    . bromocriptine (PARLODEL) 5 MG capsule Take 5 mg by mouth at bedtime.    . calcium acetate (PHOSLO) 667 MG capsule Take 667 mg by mouth 4 (four) times daily.    .  cetirizine (ZYRTEC) 10 MG tablet Take 10 mg by mouth at bedtime.    . cinacalcet (SENSIPAR) 30 MG tablet Take 30 mg by mouth every evening.    . diltiazem (CARDIZEM) 30 MG tablet Take 1 tablet (30 mg total) by mouth every 12 (twelve) hours.   . dorzolamide-timolol (COSOPT) 22.3-6.8 MG/ML ophthalmic solution Place 1 drop into the right eye 2 (two) times daily.   . metoprolol tartrate (LOPRESSOR) 50 MG tablet Take 1 tablet (50 mg total) by mouth 2 (two) times daily.   . midodrine (PROAMATINE) 10 MG tablet Take 10 mg by mouth as directed. TAKE 1 TABLET PRIOR TO DIALYSIS. OK TO TAKE EXTRA 1/2 TAB FOR LOW BP PRIOR TO DIALYSIS. OK TO TAKE 15 MG AS NEEDED FOR LOW BP DURING DIALYSIS    . Nutritional Supplements (NEPRO) LIQD Take 237 mLs by mouth daily. 04/07/2017: Takes during HD, M-W-F  . omeprazole (PRILOSEC) 20 MG capsule Take 20 mg by mouth daily.   . polyethylene glycol (MIRALAX / GLYCOLAX) packet Take 17 g by mouth daily. 04/07/2017: Takes as needed; has not recently  . predniSONE (DELTASONE) 5 MG tablet Take 5 mg by mouth daily.    [name] senna-docusate (SENOKOT-S) 8.6-50 MG tablet Take 1 tablet by mouth at bedtime. 04/07/2017: Takes prn; reports has not needed recently  . sertraline (ZOLOFT) 50 MG tablet Take 50 mg by mouth daily.   . Tiotropium Bromide-Olodaterol (STIOLTO RESPIMAT) 2.5-2.5 MCG/ACT AERS Inhale 2 puffs into the lungs daily.   [name] warfarin (COUMADIN) 5 MG tablet Take 1-1.5 tablets by mouth daily as directed by coumadin clinic (Patient taking differently: Take 5 mg by mouth See admin instructions. Take 1 tablet (20m totally) by mouth daily.) 04/07/2017: Next coumadin clinic visit 04/08/17   . methocarbamol (ROBAXIN) 500 MG tablet Take 1 tablet (500 mg total) by mouth every 6 (six) hours as needed for muscle spasms. (Patient not taking: Reported on 04/07/2017) 04/07/2017: Reports took while SNF; none at home  . oxyCODONE-acetaminophen (ROXICET) 5-325 MG tablet Take 1 tablet by mouth every 8 (eight)  hours as needed for severe pain. (Patient not taking: Reported on 04/07/2017) 04/07/2017: Reports not taking at home; was used during recent SNF visit  . Tiotropium Bromide-Olodaterol (STIOLTO RESPIMAT) 2.5-2.5 MCG/ACT AERS Inhale 2 puffs into the lungs daily. (Patient not taking: Reported on 116/03/958 14/54/0981 Duplicate order   No facility-administered encounter medications on file as of 04/07/2017.     Functional Status:   In your present state of health, do you have any difficulty performing the following activities: 04/07/2017 11/24/2016  Hearing? Y N  Vision? Y Y  Comment Patient reports he has been legally blind "for many years" -  Difficulty concentrating or making decisions? N N  Walking or climbing stairs? Y Y  Dressing or bathing? N N  Doing errands, shopping? YTempie Donning Preparing Food and eating ? Y -  Comment Reports no trouble eating; diffculty with preparing meals due to legal blindness; family assists and prepares meals -  Using  the Toilet? N -  In the past six months, have you accidently leaked urine? N -  Comment on HD M-W-F -  Do you have problems with loss of bowel control? N -  Managing your Medications? Y -  Comment Family assists with filling pill box -  Managing your Finances? Y -  Comment Family assists -  Housekeeping or managing your Housekeeping? Y -  Comment Family assists -  Some recent data might be hidden    Fall/Depression Screening:    Fall Risk  04/07/2017 03/30/2017 03/23/2017  Falls in the past year? Yes (No Data) (No Data)  Comment Denies new falls since SNF discharge to home in December 2018 No new falls, per report of patient's caregiver/ daughter, Alexander [name], on [facility] CM written consent No new falls, per report of [name]/ daughter- caregiver, on [facility] CM written consent  Number falls in past yr: 2 or more - -  Comment - - -  Injury with Fall? Yes - -  Risk Factor Category  - - -  Risk for fall due to : History of fall(s);Impaired vision;Impaired  mobility;Medication side effect - -  Follow up Falls evaluation completed;Falls prevention discussed;Education provided - -   PHQ 2/9 Scores 04/07/2017 12/03/2016 10/26/2016 10/04/2016  PHQ - 2 Score 0 0 0 -  Exception Documentation - - - Other- indicate reason in comment box  Not completed - - - call completed with dtr   Plan:  Patient will take medications as prescribed and will attend all scheduled provider appointments  Patient will continue using assistive devices for fall prevention  I will share today's [facility] CCM notes/ care plan with patient's PCP and will place request with PCP for initiation of home health PT  [place] for ongoing transition of care to continue withscheduledtelephone callnextweek  [facility] CM Care Plan Problem One     Most Recent Value  Care Plan Problem One  Risk for hospital readmission related to recent discharge from SNF  Role Documenting the Problem One  Care Management Coordinator  Care Plan for Problem One  Active  [facility] Long Term Goal   Over the next 31 days, patient will not experience hospital readmission, as evidenced by patient reporting and review of EMR during [facility] RN CCM outreach  [place] Term Goal Start Date  03/14/17  Interventions for Problem One Long Term Goal  Initial home visit and assessment with medication reconciliation completed,  discussed possibility of initiation of home health services for PT with patient and caregivers,  care coordination outreach for home health PT completed  [facility] CM Short Term Goal #1   Over the next 7 days, patient's caregiver will schedule PCP office visit for patient, as evidenced by patient/ caregiver reporting during [facility] RN CCM outreach  [facility] CM Short Term Goal #1 Start Date  03/30/17 [Goal extended]  [facility] CM Short Term Goal #1 Met Date  04/07/17  Interventions for Short Term Goal #1  Reviewed with patient and caregiver upcoming PCP appointment, encouraged patient and caregivers to discuss  medications with PCP  [facility] CM Short Term Goal #2   Over the next 30 days, patient will verbalize plan for home health PT, as evidenced by patient/ caregiver reporting of same  [facility] CM Short Term Goal #2 Start Date  04/07/17  Interventions for Short Term Goal #2  Discussed patient preferences for PT and developed plan for initiation of same     [name], RN, BSN, [employer]  Care Coordinator [facility] Care Management  [phone]

## 2017-04-08 ENCOUNTER — Ambulatory Visit (INDEPENDENT_AMBULATORY_CARE_PROVIDER_SITE_OTHER): Payer: Medicare Other | Admitting: Pharmacist Clinician (PhC)/ Clinical Pharmacy Specialist

## 2017-04-08 ENCOUNTER — Other Ambulatory Visit: Payer: Self-pay | Admitting: *Deleted

## 2017-04-08 DIAGNOSIS — N2581 Secondary hyperparathyroidism of renal origin: Secondary | ICD-10-CM | POA: Diagnosis not present

## 2017-04-08 DIAGNOSIS — D631 Anemia in chronic kidney disease: Secondary | ICD-10-CM | POA: Diagnosis not present

## 2017-04-08 DIAGNOSIS — N186 End stage renal disease: Secondary | ICD-10-CM | POA: Diagnosis not present

## 2017-04-08 DIAGNOSIS — I4891 Unspecified atrial fibrillation: Secondary | ICD-10-CM | POA: Diagnosis not present

## 2017-04-08 DIAGNOSIS — Z7901 Long term (current) use of anticoagulants: Secondary | ICD-10-CM | POA: Diagnosis not present

## 2017-04-08 DIAGNOSIS — E1129 Type 2 diabetes mellitus with other diabetic kidney complication: Secondary | ICD-10-CM | POA: Diagnosis not present

## 2017-04-08 DIAGNOSIS — Z23 Encounter for immunization: Secondary | ICD-10-CM | POA: Diagnosis not present

## 2017-04-08 LAB — POCT INR: INR: 2.1

## 2017-04-08 NOTE — Patient Outreach (Addendum)
[place] [facility]) Care Management [place] Telephone Outreach Care Coordination  04/08/2017  Alexander [name] 1941/01/03 [id number]  Successful Care Coordination telephone outreach to "[facility]" at Dr. Pennie Banter office [phone]), PCP of [name], 77 y.o. male currently active with [facility] Community CM. Patient has history including, but not limited to, PAD, Aortic stenosis with TAVR 08/31/16, ESRD on HD (M, W, F), A-Fib on coumadin, DM, CVA in 2004, CHF, macular degeneration with legal blindness, and multiple falls. Patient presented to hospital 11/23/16 for chest pain and general malaise,sepsis, and A-fib with RVR, and was discharged to SNF for rehabilitation.Patient was subsequently discharged home from [facility] March 11, 2017 to self-care without home health services in place; during [place] home visit yesterday, patient verbalized that he is interested in having home health services for PT to improve mobility, stamina, and strength, post- recent SNF/ rehab discharge home.  Call placed today to request order from PCP for Home Health PT services;  provided [facility] with my direct phone number and with patient's daughter's cell phone number for follow up.  [phone]: received follow up/ return phone call from "Gay Filler," nurse at Dr. Pennie Banter office, who confirms that she will place home health PT order request with Dr. Shelia Media, and will follow up with patient/ family to confirm.  Plan:  Will collaborate as indicated with patient's PCP to facilitate home health PT orders  [name], RN, BSN, [place] Coordinator [facility] Care Management  [phone]

## 2017-04-11 DIAGNOSIS — N186 End stage renal disease: Secondary | ICD-10-CM | POA: Diagnosis not present

## 2017-04-11 DIAGNOSIS — D631 Anemia in chronic kidney disease: Secondary | ICD-10-CM | POA: Diagnosis not present

## 2017-04-11 DIAGNOSIS — E1129 Type 2 diabetes mellitus with other diabetic kidney complication: Secondary | ICD-10-CM | POA: Diagnosis not present

## 2017-04-11 DIAGNOSIS — N2581 Secondary hyperparathyroidism of renal origin: Secondary | ICD-10-CM | POA: Diagnosis not present

## 2017-04-11 DIAGNOSIS — Z23 Encounter for immunization: Secondary | ICD-10-CM | POA: Diagnosis not present

## 2017-04-11 NOTE — Progress Notes (Signed)
Cardiology Office Note   Date:  04/14/2017   ID:  KAINE MCQUILLEN, DOB 04/26/1940, MRN [id number]  PCP:  Deland Pretty, MD  Cardiologist:   Skeet Latch, MD  Electrophysiologist: Allegra Lai, MD  No chief complaint on file.   History of Present Illness: Alexander [name] is a 77 y.o. male with severe aortic stenosis s/p TAVR, ESRD on HD, hypotension on midodrine, diabetes mellitus, hyperlipidemia, carotid artery disease with complete occlusion of the L carotid, prior TIA and paroxysmal atrial fibrillation  who presents for follow up.  He was initially evaluated on 12/04/14 with hypotension requiring midodrine and syncope.  He had an echo that revealed LVEF 60-65% with grade 2 diastolic dysfunction, moderate AS and mild MR. He wore a 48 hour Holter 11/2014 that revealed episodes of dizziness associated with heart rates in the 50s. He had a PPM implanted 01/2015.  He had a repeat monitor 12/2014 that showed atrial fibrillation with occasional junctional escape.  He subsequently had a Lexiscan Cardiolite 01/2015 that was negative for ischemia but showed an area of distal inferior infarct vs artifact.  He saw Dr. Curt Bears on 10/06/15 where he was found to have been in atrial fibrillation since April.  He went into sinus rhythm briefly after DCCV.  Mr. Santa had an echo 06/21/16 that revealed LVEF 60-65% and moderate aortic stenosis with a mean gradient of 33 mmHg.  He continued to complain of fatigue and dyspnea, so he had a TEE 07/22/16 that revealed normal systolic function and an aortic valve that appeared severely stenoitic, though gradients indicated moderate aortic stenosis.  [name] underwent placement of a 29 mm Sapien 3 TAVR on 08/2016.  He was subsequently admitted to [facility] with volume overload in the setting of confusion about midodrine and metoprolol  He inadvertently continue taking midodrine and stopped taking metoprolol.   He required extra dialysis. He subsequently  followed up with Dr. Angelena Form on 10/07/16 and was doing fairly well. He continued to struggle to get his dry weight regulated with dialysis.  Echo on 10/07/16 revealed LVEF 55-60% with mild LVH. The prostatic valve was functioning properly early without any significant stenosis or regurgitation. At his last appointment he reported significant fatigue and shortness of breath. He also reported orthopnea. He had a chest x-ray 11/11/16 that revealed mild cardiomegaly and tiny pleural effusions consistent with mild CHF. BNP was 18,000. I called his dialysis session and asked that they lower his dry weight to try using increase the amount of volume removed with each session.  Mr. Briski was admitted to the hospital 11/2016 with sepsis 2/2 Strep bacteremia.  The source was unclear, but presumed to be 2/2 HD access.  He had a CT of the spine that showed no evidence of discitis or abscess.  He also had a TEE 12/01/16 that revealed no vegetations on his PPM lead and normal TAVR valve function.  There was moderate TR and moderate MR. His hypotension resolved prior to discharge.  Mr. Hanway continues to struggle with his blood pressure dropped dropping during hemodialysis.  His blood pressure starts out in the 850Y systolic and quickly drops to the 60s or 80s.  He takes 15 mg of Midrin when this happens which helps for a while.  His blood pressure then dropped again and he has to take another 50 mg prior to leaving dialysis.  He takes 10 mg when he first wakes up on dialysis days.  His sessions have been stopped early  several times due to the blood pressure dropping.   Past Medical History:  Diagnosis Date  . Anemia   . Aortic stenosis 06/15/12   TEE - EF 02-63%; grade 1 diastolic dysfunction; mild/mod aortic valve stenosis; Mitral valve had calcified annulus, mild pulm htn PA peak pressure 66mmHg  . Barrett's esophagus 05/2003  . Bradycardia 2017   [employer] 2240 Assurity dual-lead pacemaker  . Carpal tunnel  syndrome, bilateral 11/03/2015  . Colon polyps   . CVA (cerebral infarction)    2004/affected left side  . Depression   . Diabetes mellitus without complication ([place])   . Diabetic peripheral neuropathy ([place]) 10/02/2015  . Diverticulosis   . Dyspnea    with exertion  . End stage renal disease ([place])    hemodialysis 3 times a week  . GERD (gastroesophageal reflux disease)   . Glaucoma   . Hyperlipidemia   . Hypertension   . Kidney stones   . Legally blind   . Macular degeneration    both eyes  . Orthostatic hypotension 09/09/2015  . Paroxysmal atrial fibrillation (HCC)   . Peptic ulcer    bleeding, 1969  . Presence of permanent cardiac pacemaker   . S/P epidural steroid injection    last  injection over 10 years ago  . Seasonal allergies   . Tubular adenoma of colon 07/2001    Past Surgical History:  Procedure Laterality Date  . AV FISTULA PLACEMENT  2009  . BACK SURGERY    . CARDIOVERSION N/A 11/13/2015   Procedure: CARDIOVERSION;  Surgeon: Troy Sine, MD;  [facility] ENDOSCOPY;  Service: Cardiovascular;  Laterality: N/A;  . CARDIOVERSION N/A 01/13/2016   Procedure: CARDIOVERSION;  Surgeon: Will Meredith [name], MD;  Location: [place];  Service: Cardiovascular;  Laterality: N/A;  . [place]   right eye  . CYSTOSCOPY  several times   kidney stones  . EP IMPLANTABLE DEVICE N/A 03/11/2015   Procedure: Pacemaker Implant;  Surgeon: Will Meredith [name], MD;  [place];  Laterality: Left  . LAMINECTOMY  1969  . RIGHT/LEFT HEART CATH AND CORONARY ANGIOGRAPHY N/A 08/20/2016   Procedure: Right/Left Heart Cath and Coronary Angiography;  Surgeon: Burnell Blanks, MD;  Location: [place] CV LAB;  Service: Cardiovascular;  Laterality: N/A;  . TEE WITHOUT CARDIOVERSION N/A 07/22/2016   Procedure: TRANSESOPHAGEAL ECHOCARDIOGRAM (TEE);  Surgeon: Skeet Latch, MD;  Location: [place];  Service: Cardiovascular;  Laterality: N/A;  .  TEE WITHOUT CARDIOVERSION N/A 08/31/2016   Procedure: TRANSESOPHAGEAL ECHOCARDIOGRAM (TEE);  Surgeon: Burnell Blanks, MD;  Location: [place];  Service: Open Heart Surgery;  Laterality: N/A;  . TEE WITHOUT CARDIOVERSION N/A 12/01/2016   Procedure: TRANSESOPHAGEAL ECHOCARDIOGRAM (TEE);  Surgeon: Larey Dresser, MD;  Location: [facility] ENDOSCOPY;  Service: Cardiovascular;  Laterality: N/A;  . TONSILLECTOMY  1964  . TRANSCATHETER AORTIC VALVE REPLACEMENT, TRANSFEMORAL N/A 08/31/2016   Procedure: TRANSCATHETER AORTIC VALVE REPLACEMENT, TRANSFEMORAL;  Surgeon: Burnell Blanks, MD;  Location: [place];  Service: Open Heart Surgery;  Laterality: N/A;     Current Outpatient Medications  Medication Sig Dispense Refill  . acetaminophen (TYLENOL) 325 MG tablet Take 2 tablets (650 mg total) by mouth every 6 (six) hours as needed for mild pain.    [name] atorvastatin (LIPITOR) 40 MG tablet Take 20 mg by mouth daily.    [name] b complex-vitamin c-folic acid (NEPHRO-VITE) 0.8 MG TABS Take 1 tablet by mouth daily.     . bromocriptine (  PARLODEL) 5 MG capsule Take 5 mg by mouth at bedtime.     . calcium acetate (PHOSLO) 667 MG capsule Take 667 mg by mouth 4 (four) times daily.     . cetirizine (ZYRTEC) 10 MG tablet Take 10 mg by mouth at bedtime.     . cinacalcet (SENSIPAR) 30 MG tablet Take 30 mg by mouth every evening.     . digoxin (LANOXIN) 0.125 MG tablet 1/2 TABLET BY MOUTH EVERY 48 HOURS 15 tablet 3  . dorzolamide-timolol (COSOPT) 22.3-6.8 MG/ML ophthalmic solution Place 1 drop into the right eye 2 (two) times daily.    . methocarbamol (ROBAXIN) 500 MG tablet Take 1 tablet (500 mg total) by mouth every 6 (six) hours as needed for muscle spasms. (Patient not taking: Reported on 04/07/2017) 10 tablet 0  . metoprolol tartrate (LOPRESSOR) 50 MG tablet Take 1 tablet (50 mg total) by mouth 2 (two) times daily. 60 tablet 0  . midodrine (PROAMATINE) 10 MG tablet Take 10 mg by mouth as directed. TAKE 1 TABLET PRIOR TO  DIALYSIS. OK TO TAKE EXTRA 1/2 TAB FOR LOW BP PRIOR TO DIALYSIS. OK TO TAKE 15 MG AS NEEDED FOR LOW BP DURING DIALYSIS     . Nutritional Supplements (NEPRO) LIQD Take 237 mLs by mouth daily.    [name] omeprazole (PRILOSEC) 20 MG capsule Take 20 mg by mouth daily.    [name] oxyCODONE-acetaminophen (ROXICET) 5-325 MG tablet Take 1 tablet by mouth every 8 (eight) hours as needed for severe pain. (Patient not taking: Reported on 04/07/2017) 5 tablet 0  . polyethylene glycol (MIRALAX / GLYCOLAX) packet Take 17 g by mouth daily. 14 each 0  . predniSONE (DELTASONE) 5 MG tablet Take 5 mg by mouth daily.   1  . senna-docusate (SENOKOT-S) 8.6-50 MG tablet Take 1 tablet by mouth at bedtime. 14 tablet 0  . sertraline (ZOLOFT) 50 MG tablet Take 50 mg by mouth daily.  5  . Tiotropium Bromide-Olodaterol (STIOLTO RESPIMAT) 2.5-2.5 MCG/ACT AERS Inhale 2 puffs into the lungs daily. (Patient not taking: Reported on 01/29/2017) 1 Inhaler 0  . warfarin (COUMADIN) 5 MG tablet Take 1-1.5 tablets by mouth daily as directed by coumadin clinic (Patient taking differently: Take 5 mg by mouth See admin instructions. Take 1 tablet (5mg  totally) by mouth daily.) 120 tablet 1   No current facility-administered medications for this visit.     Allergies:   Penicillins; Codeine; and Tramadol    Social History:  The patient  reports that he quit smoking about 19 years ago. His smoking use included cigarettes. He has a 90.00 pack-year smoking history. he has never used smokeless tobacco. He reports that he does not drink alcohol or use drugs.    Family History:  The patient's family history includes Cancer in his brother; Heart attack in his father; Heart disease in his sister; Hypertension in his father; Stomach cancer in his mother; Stroke in his sister.    ROS:  Please see the history of present illness.   Otherwise, review of systems are positive for leg spasm.   All other systems are reviewed and negative.    PHYSICAL EXAM: .VS:  BP  124/64   Pulse (!) 107   Ht 5' 10.5" (1.791 m)   Wt 202 lb (91.6 kg)   SpO2 97%   BMI 28.57 kg/m  , BMI Body mass index is 28.57 kg/m. GENERAL:  Chronically ill-appearing.   HEENT: Pupils equal round and reactive, fundi not visualized, oral mucosa  unremarkable NECK:   Waveform within normal limits, carotid upstroke brisk and symmetric, no bruits, no thyromegaly LUNGS:  Clear to auscultation bilaterally HEART:  Irregularly irregular.  Tachycardic.  PMI not displaced or sustained,S1 and S2 within normal limits, no S3, no S4, no clicks, no rubs, no murmurs ABD:  Flat, positive bowel sounds normal in frequency in pitch, no bruits, no rebound, no guarding, no midline pulsatile mass, no hepatomegaly, no splenomegaly EXT:  2 plus pulses throughout,  edema, no cyanosis no clubbing SKIN:  No rashes no nodules NEURO:  Cranial nerves II through XII grossly intact, motor grossly intact throughout PSYCH:  Cognitively intact, oriented to person place and time   EKG:  EKG is not ordered today. 09/09/15: Atrial fibrillation rate 108 bpm.  Prior inferior infarct. 01/16/15: Sinus bradycardia.  First degree heart block.  LAFB.  U waves. The ekg from 11/19/14 demonstrates sinus bradycardia at 49 bpm. First degree heart block.  08/06/16: Atrial fibrillation.  Rate 82 bpm.  Occcasional VP. 11/11/16: Atrial fibrillation. Rate 97 bpm. Left bundle branch block.  03/15/17: Atrial fibrillation  Rate 105 bpm.  LBBB.  PVC  Lexiscan Cardiolite 01/28/15:  Nuclear stress EF: 67%.  The left ventricular ejection fraction is hyperdynamic (>65%).  There was no ST segment deviation noted during stress.  This is a low risk study.  Low risk stress nuclear study with a small, severe, predominantly fixed distal inferior defect consistent with small prior infarct vs thinning; no significant ischemia; EF 67 with normal wall motion.  Echo 10/07/16: Study Conclusions  - Left ventricle: The cavity size was normal. Wall  thickness was   increased in a pattern of mild LVH. Systolic function was normal.   The estimated ejection fraction was in the range of 55% to 60%.   Wall motion was normal; there were no regional wall motion   abnormalities. Doppler parameters are consistent with high   ventricular filling pressure. - Aortic valve: A bioprosthesis was present. - Mitral valve: Calcified annulus. There was mild regurgitation.   Valve area by pressure half-time: 2.08 cm^2. - Left atrium: The atrium was mildly dilated. - Right atrium: The atrium was moderately dilated. - Pulmonary arteries: Systolic pressure was mildly increased. PA   peak pressure: 36 mm Hg (S).  Impressions:  - Normal LV systolic function; mild LVH; elevated LV filling   pressure; s/p AVR with normal mean gradient (9 mmHg) and   calculated AVA of 1.4 cm2; no AI; mild MR; biatrial enlargement;   mild TR with mildly elevated pulmonary pressure.  Recent Labs: 11/11/2016: NT-Pro BNP 18,028 11/23/2016: Magnesium 1.5 01/29/2017: ALT 19; BUN 22; Creatinine, Ser 4.91; Hemoglobin 9.7; Platelets 160; Potassium 3.7; Sodium 141    Lipid Panel    Component Value Date/Time   CHOL 138 10/28/2015 1008   TRIG 158 (H) 10/28/2015 1008   HDL 30 (L) 10/28/2015 1008   CHOLHDL 4.6 10/28/2015 1008   VLDL 32 (H) 10/28/2015 1008   LDLCALC 76 10/28/2015 1008      Wt Readings from Last 3 Encounters:  04/12/17 202 lb (91.6 kg)  03/15/17 204 lb (92.5 kg)  01/27/17 196 lb 6 oz (89.1 kg)     Other studies Reviewed: Additional studies/ records that were reviewed today include: [facility] records Review of the above records demonstrates:  Please see elsewhere in the note.     ASSESSMENT AND PLAN:  # Severe aortic stenosis s/p TAVR:  # Shortness of breath: TAVR is functioning properly.  Symptoms have  improved and TAVR looked good on echo 11/2016.  No changes.   # Hypotension/syncope:  Stop diltiazem.  Continue midodrine 10mg  tid as needed.    #  Bradycardia/longstanding persistent atrial fibrillation:  Mr. Bozzo had a cardioversion but only went into sinus rhythm temporarily.  He was on amiodarone at the time.  He remains tachycardic but minimally symptomatic.  We will stop diltiazem to allow for increased BP room.  Continue metoprolol for now.  Start digoxin 0.0625mg  every 48 hours and check level in 1 week.  Continue warfarin.     # Hyperlipidemia: LDL 76 10/2015.  He reports myalgias.  Stop atorvastatin for 2 weeks.  If his symptoms persist he will restart it.    Current medicines are reviewed at length with the patient today.  The patient does not have concerns regarding medicines.  The following changes have been made: stop diltiazem and start digoxin.  Labs/ tests ordered today include:   Orders Placed This Encounter  Procedures  . Digoxin level     Disposition:   FU with Dr. Jonelle Sidle C. [place] in 2 months   Signed, Skeet Latch, MD  04/14/2017 10:00 PM    [place]

## 2017-04-12 ENCOUNTER — Ambulatory Visit (INDEPENDENT_AMBULATORY_CARE_PROVIDER_SITE_OTHER): Payer: Medicare Other | Admitting: Cardiovascular Disease

## 2017-04-12 ENCOUNTER — Encounter: Payer: Self-pay | Admitting: Cardiovascular Disease

## 2017-04-12 VITALS — BP 124/64 | HR 107 | Ht 70.5 in | Wt 202.0 lb

## 2017-04-12 DIAGNOSIS — I480 Paroxysmal atrial fibrillation: Secondary | ICD-10-CM

## 2017-04-12 DIAGNOSIS — Z992 Dependence on renal dialysis: Secondary | ICD-10-CM | POA: Insufficient documentation

## 2017-04-12 DIAGNOSIS — E7800 Pure hypercholesterolemia, unspecified: Secondary | ICD-10-CM | POA: Diagnosis not present

## 2017-04-12 DIAGNOSIS — Z79899 Other long term (current) drug therapy: Secondary | ICD-10-CM

## 2017-04-12 DIAGNOSIS — I482 Chronic atrial fibrillation: Secondary | ICD-10-CM | POA: Diagnosis not present

## 2017-04-12 DIAGNOSIS — Z5181 Encounter for therapeutic drug level monitoring: Secondary | ICD-10-CM

## 2017-04-12 DIAGNOSIS — I639 Cerebral infarction, unspecified: Secondary | ICD-10-CM | POA: Insufficient documentation

## 2017-04-12 DIAGNOSIS — I951 Orthostatic hypotension: Secondary | ICD-10-CM | POA: Diagnosis not present

## 2017-04-12 DIAGNOSIS — I4891 Unspecified atrial fibrillation: Secondary | ICD-10-CM | POA: Diagnosis not present

## 2017-04-12 DIAGNOSIS — E789 Disorder of lipoprotein metabolism, unspecified: Secondary | ICD-10-CM | POA: Diagnosis not present

## 2017-04-12 DIAGNOSIS — I10 Essential (primary) hypertension: Secondary | ICD-10-CM | POA: Diagnosis not present

## 2017-04-12 DIAGNOSIS — Z952 Presence of prosthetic heart valve: Secondary | ICD-10-CM

## 2017-04-12 MED ORDER — DIGOXIN 125 MCG PO TABS: ORAL_TABLET | ORAL | 3 refills | Status: DC

## 2017-04-12 NOTE — Patient Instructions (Signed)
Medication Instructions:  START DIGOXIN 0.125 MG 1/2 TABLET BY MOUTH EVERY 48 HOURS  STOP YOUR DILTIAZEM Thursday   STOP YOUR ATORVASTATIN FOR 2 WEEKS, IF YOUR PAINS DO NOT IMPROVE RESTART  Labwork: DIGOXIN LEVEL NEXT WEEK AT DIALYSIS BEFORE YOU HAVE YOUR MORNING DOSE OF DIGOXIN   Testing/Procedures: NONE   Follow-Up: Your physician recommends that you schedule a follow-up appointment in: 2 MONTH OV  If you need a refill on your cardiac medications before your next appointment, please call your pharmacy.

## 2017-04-13 ENCOUNTER — Other Ambulatory Visit: Payer: Self-pay | Admitting: *Deleted

## 2017-04-13 ENCOUNTER — Telehealth: Payer: Self-pay | Admitting: Cardiovascular Disease

## 2017-04-13 ENCOUNTER — Encounter: Payer: Self-pay | Admitting: *Deleted

## 2017-04-13 DIAGNOSIS — E1129 Type 2 diabetes mellitus with other diabetic kidney complication: Secondary | ICD-10-CM | POA: Diagnosis not present

## 2017-04-13 DIAGNOSIS — Z5181 Encounter for therapeutic drug level monitoring: Secondary | ICD-10-CM

## 2017-04-13 DIAGNOSIS — N2581 Secondary hyperparathyroidism of renal origin: Secondary | ICD-10-CM | POA: Diagnosis not present

## 2017-04-13 DIAGNOSIS — N186 End stage renal disease: Secondary | ICD-10-CM | POA: Diagnosis not present

## 2017-04-13 DIAGNOSIS — D631 Anemia in chronic kidney disease: Secondary | ICD-10-CM | POA: Diagnosis not present

## 2017-04-13 DIAGNOSIS — Z79899 Other long term (current) drug therapy: Principal | ICD-10-CM

## 2017-04-13 DIAGNOSIS — Z23 Encounter for immunization: Secondary | ICD-10-CM | POA: Diagnosis not present

## 2017-04-13 NOTE — Telephone Encounter (Signed)
[facility] and [facility] can not do the labs. Spoke with daughter and will just get labs when he comes for PT/INR next week

## 2017-04-13 NOTE — Patient Outreach (Signed)
[facility]) Care Management [place] Telephone Outreach, Transition of Care day 31  04/13/2017  Alexander [name] 06-Jun-1940 [id number]  Successful telephone outreach to Alexander [name], daughter/ caregiver, on [facility] CM written consent, of Alexander [name] is a 77 y.o. male originallyreferred to [place] from [facility] telephonic RN CM on original MD referral. Patient has history including, but not limited to, PAD, Aortic stenosis with TAVR 08/31/16, ESRD on HD (M, W, F), A-Fib on coumadin, DM, CVA in 2004, CHF, macular degeneration with legal blindness, and multiple falls. Patient presented to hospital 11/23/16 for chest pain and general malaise,sepsis, and A-fib with RVR, and was discharged to SNF for rehabilitation.Patient was subsequently discharged home from [facility] March 11, 2017 to self-care without home health services in place.  HIPAA/ identity verified with patient's caregiver today during phone call.  Today, patient'sdaughter reportsthat patient "seems to be doing pretty good."  Caregiver reports ongoing chronic pain of (R) flank that radiates into (R) LE, states "he says it is about the same as it always is."  Caregiver reports that patient's usual interventions for pain "helps temporarily," but confirms that patient continues having pain "daily."  Caregiver states that patient's PCP had previously ordered pain management evaluation, but reports that the consult call was placed much later after this was discussed with PCP, and that when pain management provider contacted patient, patient's wife had forgotten to expect their call and inadvertently erased the message left.    Alexander [name] reports that patient shared his concerns with pain with cardiology provider during office visit yesterday, and that cardiologist has advised patient to discontinue use of atorvastatin for 2 weeks, to determine if this could be the cause of the pain; discussed with Alexander [name] that she could  re-contact PCP if patient would like to go ahead and re-initiate pain management consult, OR, that she/ patient could discuss in person with PCP at upcoming provider appointment scheduled for 04/26/17.  Alexander [name] reports that she will discuss with patient to determine his preference.  Alexander [name] reports patient's pain "is pretty much his usual pain," and denies that patient is in apparent/ obvious distress when she sees him.   Caregiver also reports that patient has continued having ongoing episodes where his "BP bottoms out" during HD sessions; reports cardiology aware and "trying the digoxin in small dose" to see if this helps.  Patient and caregiversfurther reports:  Medications: -- Has all medicationsand takes as prescribed,denies questions/ concerns around patient's medications today -- acknowledges that during cardiology office visit yesterday digoxin was added in "a very small dose," and diltiazem "was stopped for now;"  Again reports changes to atorvastatin based on patient's ongoing pain, as noted above.   -- denies other changes to patient's medications  Provider appointments: -- All recent and upcoming provider appointments were reviewed with caregiverstoday; caregiver confirms that she will continue attending provider appointments with patient and will provide transportation -- continues attending scheduled HD sessions on M-W-F, by taking [employer] bus to HD sessions, with patient's wife picking him up from HD sessions.  Safety/ Mobility/ Falls: -- denies new falls since discharge home from SNF -- patient continues using walker "all the time," has wheelchair available in home for use as indicated/ needed  [facility]) services: -- reports that she spoke with patient's PCP nurse "yesterday" to follow up on care coordination request previously placed 04/07/17 for [facility] PT order; currently waiting to hear back on status of order; reported "the nurse told us they  would be ordering this  soon."  Self-health management if chronic disease state of A-Fib/ CKD requiring HD: -- patient has continued monitoring and recording daily BP's around recent decreased BP's with HD sessions; reports no concerns at home, but continues having episodes of low BP's with HD sessions, despite taking midrodine as prescribed -- Caregiver reports no concerning episodes around chronic state of A-Fib; continues attending coumadin clinic as scheduled  Caregiverdenies further issues, concerns, or problems today.  Caregiver reports that she currently believes "things are under pretty good control" and she denies ned to schedule [facility] CCM home visit; requests follow up phone call in 2 weeks, and agrees to contact me directly for any new concerns, changes, questions, etc that arise prior to next scheduled [facility] RN CCM outreach.  I confirmed that patient/ caregivers havemy direct phone number, the main [facility] CM office phone number, and the [facility] CM 24-hour nurse advice phone number should issues arise prior to next scheduled [place] scheduled telephone callin 2 weeks.   Plan:  Patient will take medications as prescribed and will attend all scheduled provider appointments  Patient will continue using assistive devices for fall prevention  Patient will actively participate in home health PT once they become active in his care  Patient/ caregivers will discuss patient's chronic pain with PCP and will consider re-initiating pain management consultation  [place] to continue withscheduledtelephone callin 2 weeks  [facility] CM Care Plan Problem One     Most Recent Value  Care Plan Problem One  Risk for hospital readmission related to recent discharge from SNF  Role Documenting the Problem One  Care Management [place] for Problem One  Active  [facility] Long Term Goal   Over the next 31 days, patient will not experience hospital readmission, as evidenced by patient  reporting and review of EMR during [facility] RN CCM outreach  [place] Term Goal Start Date  03/14/17  [facility] Long Term Goal Met Date  04/13/17- goal met; patient did not experience hospital readmission in 31 days  [facility] CM Short Term Goal #2   Over the next 30 days, patient will verbalize plan for home health PT, as evidenced by patient/ caregiver reporting of same  [facility] CM Short Term Goal #2 Start Date  04/07/17  Interventions for Short Term Goal #2  Discussed with patient's caregiver update on previously placed care coordination request for initiation of [place] PT,  confirmed that PCP office has contacted caregiver around [facility] agency preference    [facility] CM Care Plan Problem Two     Most Recent Value  Care Plan Problem Two  Ongoing management of chronic pain, as previously evidenced by patient reporting, and confirmation of same today by caregiver reporting  Role Documenting the Problem Two  Care Management [place] for Problem Two  Active  [facility] CM Short Term Goal #1   Over the next 30 days, patient and/or caregiver will verbalize plan for ongoing pain management, as evidenced by patient and / or caregiver reporting of same during [facility] RN CCM outreach  [facility] CM Short Term Goal #1 Start Date  04/13/17  Interventions for Short Term Goal #2   Discussed patient's recent provider office visits with patient's caregiver,  confirmed that patient's pain has not changed,  encouraged caregiver to discuss with patient's PCP at upcoming scheduled office visit, or to call PCP sooner if necessary     [name], RN, BSN, [employer]  Care Coordinator [facility] Care Management  [phone]

## 2017-04-13 NOTE — Telephone Encounter (Signed)
New Message     Per [facility] C[facility] they can not draw the Digoxin level  , you would have to drop off a tube and pick it back up , please call

## 2017-04-13 NOTE — Telephone Encounter (Signed)
Spoke with

## 2017-04-14 ENCOUNTER — Encounter: Payer: Self-pay | Admitting: Cardiovascular Disease

## 2017-04-15 DIAGNOSIS — E1129 Type 2 diabetes mellitus with other diabetic kidney complication: Secondary | ICD-10-CM | POA: Diagnosis not present

## 2017-04-15 DIAGNOSIS — D631 Anemia in chronic kidney disease: Secondary | ICD-10-CM | POA: Diagnosis not present

## 2017-04-15 DIAGNOSIS — N186 End stage renal disease: Secondary | ICD-10-CM | POA: Diagnosis not present

## 2017-04-15 DIAGNOSIS — Z23 Encounter for immunization: Secondary | ICD-10-CM | POA: Diagnosis not present

## 2017-04-15 DIAGNOSIS — N2581 Secondary hyperparathyroidism of renal origin: Secondary | ICD-10-CM | POA: Diagnosis not present

## 2017-04-18 DIAGNOSIS — D631 Anemia in chronic kidney disease: Secondary | ICD-10-CM | POA: Diagnosis not present

## 2017-04-18 DIAGNOSIS — N186 End stage renal disease: Secondary | ICD-10-CM | POA: Diagnosis not present

## 2017-04-18 DIAGNOSIS — Z23 Encounter for immunization: Secondary | ICD-10-CM | POA: Diagnosis not present

## 2017-04-18 DIAGNOSIS — E1129 Type 2 diabetes mellitus with other diabetic kidney complication: Secondary | ICD-10-CM | POA: Diagnosis not present

## 2017-04-18 DIAGNOSIS — N2581 Secondary hyperparathyroidism of renal origin: Secondary | ICD-10-CM | POA: Diagnosis not present

## 2017-04-20 DIAGNOSIS — D631 Anemia in chronic kidney disease: Secondary | ICD-10-CM | POA: Diagnosis not present

## 2017-04-20 DIAGNOSIS — I482 Chronic atrial fibrillation: Secondary | ICD-10-CM | POA: Diagnosis not present

## 2017-04-20 DIAGNOSIS — N186 End stage renal disease: Secondary | ICD-10-CM | POA: Diagnosis not present

## 2017-04-20 DIAGNOSIS — Z23 Encounter for immunization: Secondary | ICD-10-CM | POA: Diagnosis not present

## 2017-04-20 DIAGNOSIS — N2581 Secondary hyperparathyroidism of renal origin: Secondary | ICD-10-CM | POA: Diagnosis not present

## 2017-04-20 DIAGNOSIS — E1129 Type 2 diabetes mellitus with other diabetic kidney complication: Secondary | ICD-10-CM | POA: Diagnosis not present

## 2017-04-22 ENCOUNTER — Ambulatory Visit (INDEPENDENT_AMBULATORY_CARE_PROVIDER_SITE_OTHER): Payer: Medicare Other | Admitting: Pharmacist

## 2017-04-22 DIAGNOSIS — Z7901 Long term (current) use of anticoagulants: Secondary | ICD-10-CM

## 2017-04-22 DIAGNOSIS — N2581 Secondary hyperparathyroidism of renal origin: Secondary | ICD-10-CM | POA: Diagnosis not present

## 2017-04-22 DIAGNOSIS — N186 End stage renal disease: Secondary | ICD-10-CM | POA: Diagnosis not present

## 2017-04-22 DIAGNOSIS — Z79899 Other long term (current) drug therapy: Secondary | ICD-10-CM | POA: Diagnosis not present

## 2017-04-22 DIAGNOSIS — Z5181 Encounter for therapeutic drug level monitoring: Secondary | ICD-10-CM | POA: Diagnosis not present

## 2017-04-22 DIAGNOSIS — I4891 Unspecified atrial fibrillation: Secondary | ICD-10-CM

## 2017-04-22 DIAGNOSIS — Z23 Encounter for immunization: Secondary | ICD-10-CM | POA: Diagnosis not present

## 2017-04-22 DIAGNOSIS — D631 Anemia in chronic kidney disease: Secondary | ICD-10-CM | POA: Diagnosis not present

## 2017-04-22 DIAGNOSIS — E1129 Type 2 diabetes mellitus with other diabetic kidney complication: Secondary | ICD-10-CM | POA: Diagnosis not present

## 2017-04-22 LAB — POCT INR: INR: 2.1

## 2017-04-23 LAB — DIGOXIN LEVEL: Digoxin, Serum: <0.4 ng/mL — ABNORMAL LOW (ref 0.5–0.9)

## 2017-04-25 DIAGNOSIS — Z23 Encounter for immunization: Secondary | ICD-10-CM | POA: Diagnosis not present

## 2017-04-25 DIAGNOSIS — D631 Anemia in chronic kidney disease: Secondary | ICD-10-CM | POA: Diagnosis not present

## 2017-04-25 DIAGNOSIS — N2581 Secondary hyperparathyroidism of renal origin: Secondary | ICD-10-CM | POA: Diagnosis not present

## 2017-04-25 DIAGNOSIS — E1129 Type 2 diabetes mellitus with other diabetic kidney complication: Secondary | ICD-10-CM | POA: Diagnosis not present

## 2017-04-25 DIAGNOSIS — N186 End stage renal disease: Secondary | ICD-10-CM | POA: Diagnosis not present

## 2017-04-26 ENCOUNTER — Telehealth: Payer: Self-pay | Admitting: *Deleted

## 2017-04-26 DIAGNOSIS — D631 Anemia in chronic kidney disease: Secondary | ICD-10-CM | POA: Diagnosis not present

## 2017-04-26 DIAGNOSIS — Z992 Dependence on renal dialysis: Secondary | ICD-10-CM | POA: Diagnosis not present

## 2017-04-26 DIAGNOSIS — I482 Chronic atrial fibrillation: Secondary | ICD-10-CM | POA: Diagnosis not present

## 2017-04-26 DIAGNOSIS — M6281 Muscle weakness (generalized): Secondary | ICD-10-CM | POA: Diagnosis not present

## 2017-04-26 DIAGNOSIS — M545 Low back pain: Secondary | ICD-10-CM | POA: Diagnosis not present

## 2017-04-26 DIAGNOSIS — N186 End stage renal disease: Secondary | ICD-10-CM | POA: Diagnosis not present

## 2017-04-26 DIAGNOSIS — I10 Essential (primary) hypertension: Secondary | ICD-10-CM | POA: Diagnosis not present

## 2017-04-26 DIAGNOSIS — F329 Major depressive disorder, single episode, unspecified: Secondary | ICD-10-CM | POA: Diagnosis not present

## 2017-04-26 DIAGNOSIS — Z79899 Other long term (current) drug therapy: Secondary | ICD-10-CM

## 2017-04-26 DIAGNOSIS — J449 Chronic obstructive pulmonary disease, unspecified: Secondary | ICD-10-CM | POA: Diagnosis not present

## 2017-04-26 DIAGNOSIS — I509 Heart failure, unspecified: Secondary | ICD-10-CM | POA: Diagnosis not present

## 2017-04-26 DIAGNOSIS — I4891 Unspecified atrial fibrillation: Secondary | ICD-10-CM | POA: Diagnosis not present

## 2017-04-26 DIAGNOSIS — R0902 Hypoxemia: Secondary | ICD-10-CM | POA: Diagnosis not present

## 2017-04-26 DIAGNOSIS — Z5181 Encounter for therapeutic drug level monitoring: Secondary | ICD-10-CM

## 2017-04-26 NOTE — Telephone Encounter (Signed)
-----   Message from Skeet Latch, MD sent at 04/25/2017  5:14 PM EST ----- Digoxin level is undetected.  Start taking daily and repeat lab in one week.

## 2017-04-27 ENCOUNTER — Encounter: Payer: Self-pay | Admitting: *Deleted

## 2017-04-27 ENCOUNTER — Other Ambulatory Visit: Payer: Self-pay | Admitting: *Deleted

## 2017-04-27 DIAGNOSIS — D631 Anemia in chronic kidney disease: Secondary | ICD-10-CM | POA: Diagnosis not present

## 2017-04-27 DIAGNOSIS — Z23 Encounter for immunization: Secondary | ICD-10-CM | POA: Diagnosis not present

## 2017-04-27 DIAGNOSIS — N2581 Secondary hyperparathyroidism of renal origin: Secondary | ICD-10-CM | POA: Diagnosis not present

## 2017-04-27 DIAGNOSIS — E1129 Type 2 diabetes mellitus with other diabetic kidney complication: Secondary | ICD-10-CM | POA: Diagnosis not present

## 2017-04-27 DIAGNOSIS — N186 End stage renal disease: Secondary | ICD-10-CM | POA: Diagnosis not present

## 2017-04-27 NOTE — Patient Outreach (Signed)
[facility]) Care Management [place] Telephone Outreach  04/27/2017  [name] 1940/06/13 [id number]  Successful telephone outreach to Alexander [name], daughter/ caregiver, on [facility] CM written consent, of Alexander [name] a 77 y.o.maleoriginallyreferred to [place] from [facility] telephonic RN CM on original MD referral. Patient has history including, but not limited to, PAD, Aortic stenosis with TAVR 08/31/16, ESRD on HD (M, W, F), A-Fib on coumadin, DM, CVA in 2004, CHF, macular degeneration with legal blindness, and multiple falls. Patient presented to hospital 11/23/16 for chest pain and general malaise,sepsis, and A-fib with RVR, and was discharged to SNF for rehabilitation.Patient was subsequently discharged home from [facility] March 11, 2017 to self-care without home health services in place.HIPAA/ identity verified with patient's caregiver today during phone call.  Today, patient'sdaughter reportsthat "all is going really well," with both patient and his spouse; reports that patient continues to complain of ongoing chronic pain of (R) flank that radiates into (R) LE; Alexander [name] reports that she "just left" patient's home and that the pain he describes "is his usual pain," and denies that patient was in apparent/ obvious distress when she saw him earlier today.  Reports patient has been attending HD sessions as scheduled and she denies any new patient falls.  Daughter/ caregiverfurther reports:  Medications: -- Has all medicationsand takes as prescribed,denies questions/ concerns around patient's medications today -- states cardiology provider office staff contacted patient yesterday to increase digoxin from every- other- day to every day; stated that patient had no detectable digoxin in his system, and provider questioned whether that is the result of patient's HD clearing the drug prior to labs being drawn vs. "too small of a dose."  Reports to  follow up with provider in one week for more lab work  -- denies other changes to patient's medications  Provider appointments: -- All recent and upcoming provider appointments were reviewed with caregivertoday; caregiver confirms that she will continue attending provider appointments with patient and will provide transportation -- attended office visit with patient's PCP yesterday, where a new pain management provider referral was placed by PCP -- to attend new pulmonary provider appointment tomorrow; daughter reports she will transport and attend with patient; states patient was referred to pulmonary provider dur to "several incidents a while back where his O2 levels dropped."  Confirms that patient is not currently on home O2 --continues attending scheduled HD sessions on M-W-F, by taking [employer] bus to HD sessions, with patient's wife picking him up from HD sessions.  [facility]) services: -- reports that PCP finalized order for home health PT, as requested during care coordination outreach placed 04/07/17 -- confirms that [facility] PT have visited patient this week, and will be coming twice weekly around patient's established HD sessions -- encouraged daughter/ caregiver to encourage patient to actively participate in [facility] PT for maximum benefit/ possible assistance with pain management, and she agrees to do so; daughter reports that patient had "so much benefit" from previous [facility] PT visits that she believes he will "do everything they tell him to."  Self-health management if chronic disease state of A-Fib/ CKD requiring HD: -- patient hascontinuedmonitoring and recording daily BP's around recent decreased BP's with HD sessions; reports no concerns at home, and states today that previous episodes of low BP's with HD sessions "seems to have stabilized," stating "they are not happening as frequently as they were when he first got home from the SNF." -- Caregiver reports no concerning episodes around  chronic state of A-Fib; continues attending coumadin clinic as scheduled, states all values from home recordings of BP's indicate that patient's heart rate is "nice and slow."  Denies that patient has had any concerns around his breathing status; denies that patient has had recent episodes chest pain  Caregiverdeniesfurther issues, concerns, or problems today.  Caregiver reports that she currently believes "things are under good control and going well" and she continues to deny need to schedule [facility] CCM home visit; requests follow up phone call in 2-3 weeks, and agrees to contact me directly for any new concerns, changes, questions, etc that arise prior to next scheduled [facility] RN CCM outreach.  I confirmed that patient/ caregivershavemy direct phone number, the main [facility] CM office phone number, and the [facility] CM 24-hour nurse advice phone number should issues arise prior to next scheduled [place] scheduled telephone callin 2 weeks.   Plan:  Patient will take medications as prescribed and will attend all scheduled provider appointments  Patient will continue using assistive devices for fall prevention  Patient will actively participate in home health PT as ordered  Patient/ caregivers will schedule office visit with pain management provider once they have heard from provider office to schedule  [place] continue withscheduledtelephone callin 2 weeks  [facility] CM Care Plan Problem One     Most Recent Value  Care Plan Problem One  Risk for hospital readmission related to recent discharge from SNF  Role Documenting the Problem One  Care Management Coordinator  Care Plan for Problem One  Not Active  [facility] CM Short Term Goal #2   Over the next 30 days, patient will verbalize plan for home health PT, as evidenced by patient/ caregiver reporting of same  [facility] CM Short Term Goal #2 Start Date  04/07/17  [facility] CM Short Term Goal #2 Met Date  04/27/17-- Goal Met   Interventions for Short Term Goal #2  Confirmed with patient's caregiver that previously placed care coordination request for initiation of HH PT has been initiated,  confirmed that [facility] PT has contacted patient and is now actively involved in patient's care    [facility] CM Care Plan Problem Two     Most Recent Value  Care Plan Problem Two  Ongoing management of chronic pain, as previously evidenced by patient reporting, and confirmation of same today by caregiver reporting  Role Documenting the Problem Two  Care Management Coordinator  Care Plan for Problem Two  Active  [facility] CM Short Term Goal #1   Over the next 30 days, patient and/or caregiver will verbalize plan for ongoing pain management, as evidenced by patient and / or caregiver reporting of same during [facility] RN CCM outreach  [facility] CM Short Term Goal #1 Start Date  04/13/17  Interventions for Short Term Goal #2   Discussed with patient's caregiver yesterday's PCP office visit and confirmed that PCP placed another referral to pain management provider,  discussed patient's current pain levels with caregiver  [facility] CM Short Term Goal #2   Over the next 30 days, patient will actively participate with [facility] PT, as evidenced by patient and caregiver reporting during [facility] RN CCM outreach  [facility] CM Short Term Goal #2 Start Date  04/27/17  Interventions for Short Term Goal #2  Confirmed that [facility] PT has visited patient,  discussed with caregiver value of active participation in [facility] PT, and possibility that [facility] PT will help to decrease patient's pain levels,  encouraged caregiver to encourage  patient to actively participate in [facility] PT as ordered  [facility] CM Short Term Goal #3   Over the next 30 day, patient will attend pain management provider office visit, as evidenced by patient/ caregiver reporting and review of EMR during [facility] RN CCM outreach  [facility] CM Short Term Goal #3 Start Date  04/27/17  Interventions for Short Term Goal #3  Confirmed with patient's caregiver/ daughter that  new referral for pain management provider has been placed by PCP during office visit yesterday     [name], RN, BSN, [place] Coordinator [facility] Care Management  [phone]

## 2017-04-28 ENCOUNTER — Ambulatory Visit (INDEPENDENT_AMBULATORY_CARE_PROVIDER_SITE_OTHER)
Admission: RE | Admit: 2017-04-28 | Discharge: 2017-04-28 | Disposition: A | Discharge status: Home / Self Care | Payer: Medicare Other | Source: Ambulatory Visit | Attending: Pulmonary Disease | Admitting: Pulmonary Disease

## 2017-04-28 ENCOUNTER — Ambulatory Visit (INDEPENDENT_AMBULATORY_CARE_PROVIDER_SITE_OTHER): Payer: Medicare Other | Admitting: Pulmonary Disease

## 2017-04-28 ENCOUNTER — Ambulatory Visit (INDEPENDENT_AMBULATORY_CARE_PROVIDER_SITE_OTHER): Payer: Medicare Other | Admitting: *Deleted

## 2017-04-28 ENCOUNTER — Encounter: Payer: Self-pay | Admitting: Pulmonary Disease

## 2017-04-28 VITALS — BP 128/74 | HR 77 | Ht 70.5 in | Wt 198.0 lb

## 2017-04-28 DIAGNOSIS — E1129 Type 2 diabetes mellitus with other diabetic kidney complication: Secondary | ICD-10-CM | POA: Diagnosis not present

## 2017-04-28 DIAGNOSIS — J439 Emphysema, unspecified: Secondary | ICD-10-CM | POA: Diagnosis not present

## 2017-04-28 DIAGNOSIS — Z992 Dependence on renal dialysis: Secondary | ICD-10-CM | POA: Diagnosis not present

## 2017-04-28 DIAGNOSIS — R0602 Shortness of breath: Secondary | ICD-10-CM | POA: Diagnosis not present

## 2017-04-28 DIAGNOSIS — N186 End stage renal disease: Secondary | ICD-10-CM | POA: Diagnosis not present

## 2017-04-28 NOTE — Progress Notes (Signed)
SIX MIN WALK 04/28/2017 04/28/2017  Medications Tylenol 325mg , nephro-vite 0.8mg , phoslo 667mg , lanoxin 0.125mg , cosopt 22.3-6.8mg /ml, lopressor 50mg , prilosec 20mg , deltasone 5mg , zoloft 50mg , coumadin 5mg  all taken at 7am -  Supplimental Oxygen during Test? (L/min) No No  Laps 4 -  Partial Lap (in Meters) 15 -  Baseline BP (sitting) 130/70 -  Baseline Heartrate 79 -  Baseline Dyspnea (Borg Scale) 0 -  Baseline Fatigue (Borg Scale) 0 -  Baseline SPO2 100 -  BP (sitting) 134/80 -  Heartrate 102 -  Dyspnea (Borg Scale) 2 -  Fatigue (Borg Scale) 1 -  SPO2 97 -  BP (sitting) 128/74 -  Heartrate 94 -  SPO2 99 -  Stopped or Paused before Six Minutes No -  Interpretation Leg pain -  Distance Completed 207 -  Tech Comments: Pt walked at a slow pace walking with a walker denying any complaints during the walk -

## 2017-04-28 NOTE — Progress Notes (Signed)
Alexander [name]    [id number]    01/11/1941  Primary Care Physician:Pharr, Thayer Jew, MD  Referring Physician: Deland Pretty, MD [address], [place] [address]  Chief complaint:  Follow up for dyspnea, emphysema  HPI: 77 year old with multiple medical issues including anemia, aortic stenosis status post TAVR, Barrett's esophagus, carpal tunnel syndrome, proximal atrial fibrillation, CVA, type 2 diabetes, end-stage renal disease, legal blindness due to macular degeneration. Here for evaluation of dyspnea over the past 6-8 months. He reports symptoms with activity and at rest, productive cough, no wheezing, fevers or chills  History noted for TAVR procedure in June 2018. The procedure improved his breathing temporarily but is now back to baseline. He had an admission after the procedure for volume overload requiring extra dialysis. Notes from [name], cardiologist mentioned elevated BNP and difficulty keeping his dry weight down with the dialysis as an outpatient. He was also admitted in the end of August 2018 for streptococcal bacteremia. TEE at that time did not show any evidence of endocarditis. He had been briefly on amiodarone from December 2016 to November of 2017 for paroxysmal atrial fibrillation. He is not on any nodal agents secondary to bradycardia.   More recently he developed arthritis symptoms and is on low-dose prednisone at 5 mg for few months.  Per review of the primary care office notes his arthritis is thought to be pseudogout. He has been evaluated on 09/13/15 by Dr. Dossie Der, Rheumatology .for hand pain which was felt more to be neuropathic, likely related to carpal tunnel syndrome versus uremia.  So he was referred to neurologist. He had a nerve conduction study which showed combination of neuropathy and carpal tunnel. He is on chronic prednisone 5 mg since September 2017  with good control of his pseudogout.  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 No mold at home. Smoking history: 3 packs per day for 45 years. Quit in 1999 Travel History: Not significant  Interim history: Continues on stiolto inhaler.  Still has chronic dyspnea on exertion with no change.  He continues on dialysis 3 times a week. He has physical therapy at home and noted to desaturate to mid 80s on exertion with physical therapy.  Outpatient Encounter Medications as of 04/28/2017  Medication Sig  . acetaminophen (TYLENOL) 325 MG tablet Take 2 tablets (650 mg total) by mouth every 6 (six) hours as needed for mild pain.  [name] atorvastatin (LIPITOR) 40 MG tablet Take 20 mg by mouth daily.  [name] b complex-vitamin c-folic acid (NEPHRO-VITE) 0.8 MG TABS Take 1 tablet by mouth daily.   . bromocriptine (PARLODEL) 5 MG capsule Take 5 mg by mouth at bedtime.   . calcium acetate (PHOSLO) 667 MG capsule Take 667 mg by mouth 4 (four) times daily.   . cetirizine (ZYRTEC) 10 MG tablet Take 10 mg by mouth at bedtime.   . cinacalcet (SENSIPAR) 30 MG tablet Take 30 mg by mouth every evening.   . digoxin (LANOXIN) 0.125 MG tablet 1/2 TABLET BY MOUTH EVERY 48 HOURS  . dorzolamide-timolol (COSOPT) 22.3-6.8 MG/ML ophthalmic solution Place 1 drop into the right eye 2 (two) times daily.  . methocarbamol (ROBAXIN) 500 MG tablet Take 1 tablet (500 mg total) by mouth every 6 (six) hours as needed for muscle spasms.  . metoprolol tartrate (LOPRESSOR) 50  MG tablet Take 1 tablet (50 mg total) by mouth 2 (two) times daily.  . midodrine (PROAMATINE) 10 MG tablet Take 10 mg by mouth as directed. TAKE 1 TABLET PRIOR TO DIALYSIS. OK TO TAKE EXTRA 1/2 TAB FOR LOW BP PRIOR TO DIALYSIS. OK TO TAKE 15 MG AS NEEDED FOR LOW BP DURING DIALYSIS   . Nutritional Supplements (NEPRO) LIQD Take 237 mLs by mouth daily.  [name] omeprazole (PRILOSEC) 20 MG capsule  Take 20 mg by mouth daily.  . polyethylene glycol (MIRALAX / GLYCOLAX) packet Take 17 g by mouth daily.  . predniSONE (DELTASONE) 5 MG tablet Take 5 mg by mouth daily.   [name] senna-docusate (SENOKOT-S) 8.6-50 MG tablet Take 1 tablet by mouth at bedtime.  . sertraline (ZOLOFT) 50 MG tablet Take 50 mg by mouth daily.  . Tiotropium Bromide-Olodaterol (STIOLTO RESPIMAT) 2.5-2.5 MCG/ACT AERS Inhale 2 puffs into the lungs daily.  [name] warfarin (COUMADIN) 5 MG tablet Take 1-1.5 tablets by mouth daily as directed by coumadin clinic (Patient taking differently: Take 5 mg by mouth See admin instructions. Take 1 tablet (5mg  totally) by mouth daily.)  . [DISCONTINUED] oxyCODONE-acetaminophen (ROXICET) 5-325 MG tablet Take 1 tablet by mouth every 8 (eight) hours as needed for severe pain.   No facility-administered encounter medications on file as of 04/28/2017.     Allergies as of 04/28/2017 - Review Complete 04/28/2017  Allergen Reaction Noted  . Penicillins Swelling 01/21/2011  . Codeine Nausea Only 06/27/2014  . Tramadol Nausea Only 06/27/2014    Past Medical History:  Diagnosis Date  . Anemia   . Aortic stenosis 06/15/12   TEE - EF 84-13%; grade 1 diastolic dysfunction; mild/mod aortic valve stenosis; Mitral valve had calcified annulus, mild pulm htn PA peak pressure 16mmHg  . Barrett's esophagus 05/2003  . Bradycardia 2017   [employer] 2240 Assurity dual-lead pacemaker  . Carpal tunnel syndrome, bilateral 11/03/2015  . Colon polyps   . CVA (cerebral infarction)    2004/affected left side  . Depression   . Diabetes mellitus without complication ([place])   . Diabetic peripheral neuropathy ([place]) 10/02/2015  . Diverticulosis   . Dyspnea    with exertion  . End stage renal disease ([place])    hemodialysis 3 times a week  . GERD (gastroesophageal reflux disease)   . Glaucoma   . Hyperlipidemia   . Hypertension   . Kidney stones   . Legally blind   . Macular degeneration    both eyes  .  Orthostatic hypotension 09/09/2015  . Paroxysmal atrial fibrillation (HCC)   . Peptic ulcer    bleeding, 1969  . Presence of permanent cardiac pacemaker   . S/P epidural steroid injection    last  injection over 10 years ago  . Seasonal allergies   . Tubular adenoma of colon 07/2001    Past Surgical History:  Procedure Laterality Date  . AV FISTULA PLACEMENT  2009  . BACK SURGERY    . CARDIOVERSION N/A 11/13/2015   Procedure: CARDIOVERSION;  Surgeon: Troy Sine, MD;  Location: [facility] ENDOSCOPY;  Service: Cardiovascular;  Laterality: N/A;  . CARDIOVERSION N/A 01/13/2016   Procedure: CARDIOVERSION;  Surgeon: Will Meredith [name], MD;  Location: [facility];  Service: Cardiovascular;  Laterality: N/A;  . [place]   right eye  . CYSTOSCOPY  several times   kidney stones  . EP IMPLANTABLE DEVICE N/A 03/11/2015   Procedure: Pacemaker Implant;  Surgeon: Will Meredith [name], MD;  [facility]. Jude  Medical 2240 Assurity;  Laterality: Left  . LAMINECTOMY  1969  . RIGHT/LEFT HEART CATH AND CORONARY ANGIOGRAPHY N/A 08/20/2016   Procedure: Right/Left Heart Cath and Coronary Angiography;  Surgeon: Burnell Blanks, MD;  Location: [place] CV LAB;  Service: Cardiovascular;  Laterality: N/A;  . TEE WITHOUT CARDIOVERSION N/A 07/22/2016   Procedure: TRANSESOPHAGEAL ECHOCARDIOGRAM (TEE);  Surgeon: Skeet Latch, MD;  Location: [place];  Service: Cardiovascular;  Laterality: N/A;  . TEE WITHOUT CARDIOVERSION N/A 08/31/2016   Procedure: TRANSESOPHAGEAL ECHOCARDIOGRAM (TEE);  Surgeon: Burnell Blanks, MD;  Location: [place];  Service: Open Heart Surgery;  Laterality: N/A;  . TEE WITHOUT CARDIOVERSION N/A 12/01/2016   Procedure: TRANSESOPHAGEAL ECHOCARDIOGRAM (TEE);  Surgeon: Larey Dresser, MD;  Location: [facility] ENDOSCOPY;  Service: Cardiovascular;  Laterality: N/A;  . TONSILLECTOMY  1964  . TRANSCATHETER AORTIC VALVE REPLACEMENT, TRANSFEMORAL N/A 08/31/2016   Procedure: TRANSCATHETER  AORTIC VALVE REPLACEMENT, TRANSFEMORAL;  Surgeon: Burnell Blanks, MD;  Location: [place];  Service: Open Heart Surgery;  Laterality: N/A;    Family History  Problem Relation Age of Onset  . Stomach cancer Mother   . Hypertension Father        Died of heart attack  . Heart attack Father   . Stroke Sister   . Heart disease Sister   . Cancer Brother   . Colon cancer Neg Hx     Social History   Socioeconomic History  . Marital status: Married    Spouse name: Not on file  . Number of children: 3  . Years of education: 29  . Highest education level: Not on file  Social Needs  . Financial resource strain: Not on file  . Food insecurity - worry: Not on file  . Food insecurity - inability: Not on file  . Transportation needs - medical: Not on file  . Transportation needs - non-medical: Not on file  Occupational History  . 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 Smoking status: Former Smoker    Packs/day: 2.00    Years: 45.00    Pack years: 90.00    Types: Cigarettes    Last attempt to quit: 01/20/1998    Years since quitting: 19.2  . Smokeless tobacco: Never Used  Substance and Sexual Activity  . Alcohol use: No    Alcohol/week: 0.0 oz  . Drug use: No  . Sexual activity: Yes  Other Topics Concern  . Not on file  Social History Narrative   Lives at home w/ his wife   Right-handed   Drinks 1 cup of coffee per day    Review of systems: Review of Systems  Constitutional: Negative for fever and chills.  HENT: Negative.   Eyes: Negative for blurred vision.  Respiratory: as per HPI  Cardiovascular: Negative for chest pain and palpitations.  Gastrointestinal: Negative for vomiting, diarrhea, blood per rectum. Genitourinary: Negative for dysuria, urgency, frequency and hematuria.  Musculoskeletal: Negative for myalgias, back pain and joint pain.  Skin: Negative for itching and rash.  Neurological: Negative for dizziness, tremors,  focal weakness, seizures and loss of consciousness.  Endo/Heme/Allergies: Negative for environmental allergies.  Psychiatric/Behavioral: Negative for depression, suicidal ideas and hallucinations.  All other systems reviewed and are negative.  Physical Exam: Blood pressure 128/74, pulse 77, height 5' 10.5" (1.791 m), weight 198 lb (89.8 kg), SpO2 99 %. Gen:      No acute distress HEENT:  EOMI, sclera anicteric Neck:  No masses; no thyromegaly Lungs:    Clear to auscultation bilaterally; normal respiratory effort CV:         Regular rate and rhythm; no murmurs Abd:      + bowel sounds; soft, non-tender; no palpable masses, no distension Ext:    No edema; adequate peripheral perfusion Skin:      Warm and dry; no rash Neuro: alert and oriented x 3 Psych: normal mood and affect  Data Reviewed: CT abdomen pelvis 05/23/03-clear lung bases. CT abdomen pelvis 01/28/11-reticular opacities, atelectasis at the lung bases CT chest 04/03/14-left apical bulla, emphysematous changes, reticular opacities at the lung bases CT chest 10/05/15-left apical bulla emphysematous changes, reticular opacities at the lung bases CT chest 08/26/16- left apical bulla, emphysematous changes. basal subpleural opacities CT chest 11/23/16-left apical bulla, emphysematous changes, reticular opacities/atelectasis predominantly at the lung bases. CT resolution 01/04/17-no interstitial lung disease, emphysematous changes, coronary atherosclerosis mild CHF I have reviewed all images personally.  PFTs  03/14/14 FVC 4.28 (99%], FEV1 3.10 (99%], TLC 91%, DLCO 84% Minimal obstruction.  08/24/16  FVC 3.28 [78%], FEV1 2.42 (79%],  F/F 74, TLC 76%, DLCO 63% Minimal obstruction, minimal restriction with mild diffusion defect.  Received records from Dr. Shelia Media, primary care physician and Dr. Lahoma Rocker, Rheumatology   Right knee arthrocentesis 11/18/25-cell count 374, positive for CPPD crystal, culture negative Sed rate 62, uric  acid 3.3, rheumatoid factor negative, ANA negative, CCP antibody negative Right knee chest x-ray with chondrocalcinosis and osteoarthritis.  Assessment:  Emphysema Multifactorial etiology for dypnea including cardiac issues, volume overload from end-stage renal disease and COPD His lung imaging and PFTs show emphysema from significant smoking history.  There is no evidence of significant obstruction on PFTs Clinically he has responded well to stiolto inhaler and will continue the same.  There is concern for desats during exertion.  He did not desat on exertion in office today.  We will investigate this further with a 6-minute walk test and overnight oximetry.  Evaluation for interstitial lung disease He had a high-resolution CT done with no evidence of lung fibrosis or interstitial lung disease CT also shows CHF and coronary artery disease.  He follows up with cardiology for this.  Pseudogout Continues on low-dose prednisone at 5 mg.  Plan/Recommendations: - Continue Stiolto inhaler - Check O2 levels with 6 MW, and overnight oximetry   Marshell Garfinkel MD [facility] Pager [phone] 04/28/2017, 8:58 AM  CC: Deland Pretty, MD

## 2017-04-28 NOTE — Patient Instructions (Addendum)
Schedule you for a 6-minute walk test and overnight oximetry Get a chest x-ray today to make sure there is no new lung process Continue stiolto inhaler Follow-up in 3 months.

## 2017-04-29 ENCOUNTER — Ambulatory Visit: Payer: Medicare Other | Admitting: *Deleted

## 2017-04-29 DIAGNOSIS — I4891 Unspecified atrial fibrillation: Secondary | ICD-10-CM | POA: Diagnosis not present

## 2017-04-29 DIAGNOSIS — N2581 Secondary hyperparathyroidism of renal origin: Secondary | ICD-10-CM | POA: Diagnosis not present

## 2017-04-29 DIAGNOSIS — F329 Major depressive disorder, single episode, unspecified: Secondary | ICD-10-CM | POA: Diagnosis not present

## 2017-04-29 DIAGNOSIS — Z992 Dependence on renal dialysis: Secondary | ICD-10-CM | POA: Diagnosis not present

## 2017-04-29 DIAGNOSIS — N186 End stage renal disease: Secondary | ICD-10-CM | POA: Diagnosis not present

## 2017-04-29 DIAGNOSIS — M6281 Muscle weakness (generalized): Secondary | ICD-10-CM | POA: Diagnosis not present

## 2017-04-29 DIAGNOSIS — I509 Heart failure, unspecified: Secondary | ICD-10-CM | POA: Diagnosis not present

## 2017-04-29 DIAGNOSIS — E1129 Type 2 diabetes mellitus with other diabetic kidney complication: Secondary | ICD-10-CM | POA: Diagnosis not present

## 2017-04-29 DIAGNOSIS — D631 Anemia in chronic kidney disease: Secondary | ICD-10-CM | POA: Diagnosis not present

## 2017-04-29 MED ORDER — DIGOXIN 125 MCG PO TABS: ORAL_TABLET | ORAL | 3 refills | Status: DC

## 2017-04-29 NOTE — Telephone Encounter (Signed)
Notes recorded by Alvina Filbert B on 04/26/2017 at 6:15 PM EST Alexander [name]

## 2017-05-02 ENCOUNTER — Telehealth: Payer: Self-pay | Admitting: Cardiovascular Disease

## 2017-05-02 DIAGNOSIS — N186 End stage renal disease: Secondary | ICD-10-CM | POA: Diagnosis not present

## 2017-05-02 DIAGNOSIS — E1129 Type 2 diabetes mellitus with other diabetic kidney complication: Secondary | ICD-10-CM | POA: Diagnosis not present

## 2017-05-02 DIAGNOSIS — N2581 Secondary hyperparathyroidism of renal origin: Secondary | ICD-10-CM | POA: Diagnosis not present

## 2017-05-02 DIAGNOSIS — D631 Anemia in chronic kidney disease: Secondary | ICD-10-CM | POA: Diagnosis not present

## 2017-05-02 NOTE — Telephone Encounter (Signed)
Advised daughter no digoxin day of labs

## 2017-05-02 NOTE — Telephone Encounter (Signed)
Patient daughter [name]) calling, states that she would like to verify if patient needs to take his Digoxin medication when he comes for bloodwork on Thursday?

## 2017-05-03 ENCOUNTER — Ambulatory Visit (INDEPENDENT_AMBULATORY_CARE_PROVIDER_SITE_OTHER): Payer: Medicare Other | Admitting: *Deleted

## 2017-05-03 DIAGNOSIS — I509 Heart failure, unspecified: Secondary | ICD-10-CM | POA: Diagnosis not present

## 2017-05-03 DIAGNOSIS — D631 Anemia in chronic kidney disease: Secondary | ICD-10-CM | POA: Diagnosis not present

## 2017-05-03 DIAGNOSIS — I495 Sick sinus syndrome: Secondary | ICD-10-CM

## 2017-05-03 DIAGNOSIS — I4891 Unspecified atrial fibrillation: Secondary | ICD-10-CM | POA: Diagnosis not present

## 2017-05-03 DIAGNOSIS — F329 Major depressive disorder, single episode, unspecified: Secondary | ICD-10-CM | POA: Diagnosis not present

## 2017-05-03 DIAGNOSIS — N186 End stage renal disease: Secondary | ICD-10-CM | POA: Diagnosis not present

## 2017-05-03 DIAGNOSIS — M6281 Muscle weakness (generalized): Secondary | ICD-10-CM | POA: Diagnosis not present

## 2017-05-03 NOTE — Progress Notes (Signed)
Remote pacemaker transmission.   

## 2017-05-04 ENCOUNTER — Encounter: Payer: Self-pay | Admitting: Cardiology

## 2017-05-04 DIAGNOSIS — N186 End stage renal disease: Secondary | ICD-10-CM | POA: Diagnosis not present

## 2017-05-04 DIAGNOSIS — D631 Anemia in chronic kidney disease: Secondary | ICD-10-CM | POA: Diagnosis not present

## 2017-05-04 DIAGNOSIS — E1129 Type 2 diabetes mellitus with other diabetic kidney complication: Secondary | ICD-10-CM | POA: Diagnosis not present

## 2017-05-04 DIAGNOSIS — N2581 Secondary hyperparathyroidism of renal origin: Secondary | ICD-10-CM | POA: Diagnosis not present

## 2017-05-05 ENCOUNTER — Ambulatory Visit (INDEPENDENT_AMBULATORY_CARE_PROVIDER_SITE_OTHER): Payer: Medicare Other | Admitting: Pharmacist

## 2017-05-05 DIAGNOSIS — F329 Major depressive disorder, single episode, unspecified: Secondary | ICD-10-CM | POA: Diagnosis not present

## 2017-05-05 DIAGNOSIS — I4891 Unspecified atrial fibrillation: Secondary | ICD-10-CM | POA: Diagnosis not present

## 2017-05-05 DIAGNOSIS — N186 End stage renal disease: Secondary | ICD-10-CM | POA: Diagnosis not present

## 2017-05-05 DIAGNOSIS — Z7901 Long term (current) use of anticoagulants: Secondary | ICD-10-CM

## 2017-05-05 DIAGNOSIS — I509 Heart failure, unspecified: Secondary | ICD-10-CM | POA: Diagnosis not present

## 2017-05-05 DIAGNOSIS — M6281 Muscle weakness (generalized): Secondary | ICD-10-CM | POA: Diagnosis not present

## 2017-05-05 DIAGNOSIS — Z79899 Other long term (current) drug therapy: Secondary | ICD-10-CM | POA: Diagnosis not present

## 2017-05-05 DIAGNOSIS — D631 Anemia in chronic kidney disease: Secondary | ICD-10-CM | POA: Diagnosis not present

## 2017-05-05 LAB — POCT INR: INR: 1.4

## 2017-05-06 DIAGNOSIS — D631 Anemia in chronic kidney disease: Secondary | ICD-10-CM | POA: Diagnosis not present

## 2017-05-06 DIAGNOSIS — N2581 Secondary hyperparathyroidism of renal origin: Secondary | ICD-10-CM | POA: Diagnosis not present

## 2017-05-06 DIAGNOSIS — E1129 Type 2 diabetes mellitus with other diabetic kidney complication: Secondary | ICD-10-CM | POA: Diagnosis not present

## 2017-05-06 DIAGNOSIS — N186 End stage renal disease: Secondary | ICD-10-CM | POA: Diagnosis not present

## 2017-05-06 LAB — DIGOXIN LEVEL: DIGOXIN, SERUM: 0.7 ng/mL (ref 0.5–0.9)

## 2017-05-06 NOTE — Telephone Encounter (Signed)
Left a message to call back.

## 2017-05-06 NOTE — Telephone Encounter (Signed)
[name]( Daughter) is returning a call . Thanks

## 2017-05-09 DIAGNOSIS — D631 Anemia in chronic kidney disease: Secondary | ICD-10-CM | POA: Diagnosis not present

## 2017-05-09 DIAGNOSIS — E1129 Type 2 diabetes mellitus with other diabetic kidney complication: Secondary | ICD-10-CM | POA: Diagnosis not present

## 2017-05-09 DIAGNOSIS — N186 End stage renal disease: Secondary | ICD-10-CM | POA: Diagnosis not present

## 2017-05-09 DIAGNOSIS — N2581 Secondary hyperparathyroidism of renal origin: Secondary | ICD-10-CM | POA: Diagnosis not present

## 2017-05-10 DIAGNOSIS — F329 Major depressive disorder, single episode, unspecified: Secondary | ICD-10-CM | POA: Diagnosis not present

## 2017-05-10 DIAGNOSIS — I4891 Unspecified atrial fibrillation: Secondary | ICD-10-CM | POA: Diagnosis not present

## 2017-05-10 DIAGNOSIS — M6281 Muscle weakness (generalized): Secondary | ICD-10-CM | POA: Diagnosis not present

## 2017-05-10 DIAGNOSIS — N186 End stage renal disease: Secondary | ICD-10-CM | POA: Diagnosis not present

## 2017-05-10 DIAGNOSIS — I509 Heart failure, unspecified: Secondary | ICD-10-CM | POA: Diagnosis not present

## 2017-05-10 DIAGNOSIS — D631 Anemia in chronic kidney disease: Secondary | ICD-10-CM | POA: Diagnosis not present

## 2017-05-10 NOTE — Telephone Encounter (Signed)
Lab work has been completed

## 2017-05-11 ENCOUNTER — Other Ambulatory Visit: Payer: Self-pay | Admitting: *Deleted

## 2017-05-11 ENCOUNTER — Encounter: Payer: Self-pay | Admitting: *Deleted

## 2017-05-11 DIAGNOSIS — E1129 Type 2 diabetes mellitus with other diabetic kidney complication: Secondary | ICD-10-CM | POA: Diagnosis not present

## 2017-05-11 DIAGNOSIS — D631 Anemia in chronic kidney disease: Secondary | ICD-10-CM | POA: Diagnosis not present

## 2017-05-11 DIAGNOSIS — N186 End stage renal disease: Secondary | ICD-10-CM | POA: Diagnosis not present

## 2017-05-11 DIAGNOSIS — N2581 Secondary hyperparathyroidism of renal origin: Secondary | ICD-10-CM | POA: Diagnosis not present

## 2017-05-11 NOTE — Patient Outreach (Signed)
[facility]) Care Management [place] Telephone Outreach  05/11/2017  DEMICHAEL TRAUM [birth year] [id number]  2:15 pm: Unsuccessful telephone outreach to [name], daughter/ caregiver, on [facility] CM written consent, ofFred C Leonardis a 77 y.o.maleoriginallyreferred to [employer] from [facility] telephonic RN CM on original MD referral. Patient has history including, but not limited to, PAD, Aortic stenosis with TAVR 08/31/16, ESRD on HD (M, W, F), A-Fib on coumadin, DM, CVA in 2004, CHF, macular degeneration with legal blindness, and multiple falls. Patient presented to hospital 11/23/16 for chest pain and general malaise,sepsis, and A-fib with RVR, and was discharged to SNF for rehabilitation.Patient was subsequently discharged home from [facility] March 11, 2017 to self-care without home health services in place.  4:10 pm:  Successful telephone outreach to Jamey Ripa, spouse/ caregiver, on [facility] CM written consent, for [name].  HIPPA/ identity verified with patient's spouse today.  Spouse reports today that patient is "hanging in there," stating "there isn't really much going on;"  Spouse reports that patient has attended all scheduled provider/ HD appointments; has continued working with home health PT twice a week, "which is helping" with patient's balance/ mobility.  Denies new/ recent patient falls.    Reports that she contacted patient's PCP regarding recent pain management clinic referral, stating that the clinic was unable to schedule the patient for a visit for "several months." States that she was able to get another (new) referral to another clinic, and confirms today that patient has a scheduled appointment with (newest) clinic, coming up "real soon."  Spouse can not remember exact date of appointment, and reports she will transport patient to appointment  Spouse denies changes to patient's medications, pain levels, plan of care, and denies  further issues, concerns, or problems today.  I confirmed that patient/ spouse have my direct phone number, the main [facility] CM office phone number, and the [facility] CM 24-hour nurse advice phone number should issues arise prior to next scheduled [place] outreach.  Plan:  Patient will take medications as prescribed and will attend all scheduled provider appointments  Patient will continue using assistive devices for fall prevention  Patient will actively participate in home health PT as ordered  Patient/ caregivers will schedule office visit with pain management provider once they have heard from provider office to schedule  [place] continue withscheduledtelephone callnext week  [facility] CM Care Plan Problem Two     Most Recent Value  Care Plan Problem Two  Ongoing management of chronic pain, as previously evidenced by patient reporting, and confirmation of same today by caregiver reporting  Role Documenting the Problem Two  Care Management [place] for Problem Two  Active  [facility] CM Short Term Goal #1   Over the next 30 days, patient and/or caregiver will verbalize plan for ongoing pain management, as evidenced by patient and / or caregiver reporting of same during [place] outreach  [facility] CM Short Term Goal #1 Start Date  04/13/17  [facility] CM Short Term Goal #1 Met Date   05/11/17 -Goal met  [facility] CM Short Term Goal #2   Over the next 30 days, patient will actively participate with [facility] PT, as evidenced by patient and caregiver reporting during [facility] RN CCM outreach  [facility] CM Short Term Goal #2 Start Date  04/27/17  Interventions for Short Term Goal #2  Confirmed with patient's spouse that [facility] PT is regularly visiting patient at home and that  patient is actively participating   [facility] CM Short Term Goal #3   Over the next 30 day, patient will attend pain management provider office visit, as evidenced by patient/ caregiver reporting and review of EMR during [facility] RN CCM  outreach  [facility] CM Short Term Goal #3 Start Date  04/27/17  Interventions for Short Term Goal #3  Confirmed with patient's caregiver/ daughter that (another) new referral for pain management provider has been placed by PCP and that appointment has been scheduled     Laine Mckinney Tousey, RN, BSN, CCRN [employer] Community Care Coordinator [facility] Care Management  [phone]    

## 2017-05-12 ENCOUNTER — Emergency Department (HOSPITAL_COMMUNITY): Payer: Medicare Other

## 2017-05-12 ENCOUNTER — Encounter (HOSPITAL_COMMUNITY): Payer: Self-pay | Admitting: Emergency Medicine

## 2017-05-12 ENCOUNTER — Other Ambulatory Visit: Payer: Self-pay

## 2017-05-12 ENCOUNTER — Inpatient Hospital Stay (HOSPITAL_COMMUNITY)
Admission: EM | Admit: 2017-05-12 | Discharge: 2017-05-14 | DRG: 312 | Disposition: A | Discharge status: Home Health | Payer: Medicare Other | Attending: Family Medicine | Admitting: Family Medicine

## 2017-05-12 ENCOUNTER — Telehealth: Payer: Self-pay | Admitting: Cardiovascular Disease

## 2017-05-12 DIAGNOSIS — I120 Hypertensive chronic kidney disease with stage 5 chronic kidney disease or end stage renal disease: Secondary | ICD-10-CM | POA: Diagnosis not present

## 2017-05-12 DIAGNOSIS — G4733 Obstructive sleep apnea (adult) (pediatric): Secondary | ICD-10-CM | POA: Diagnosis present

## 2017-05-12 DIAGNOSIS — I481 Persistent atrial fibrillation: Secondary | ICD-10-CM | POA: Diagnosis present

## 2017-05-12 DIAGNOSIS — E1142 Type 2 diabetes mellitus with diabetic polyneuropathy: Secondary | ICD-10-CM | POA: Diagnosis present

## 2017-05-12 DIAGNOSIS — S0990XA Unspecified injury of head, initial encounter: Secondary | ICD-10-CM

## 2017-05-12 DIAGNOSIS — R778 Other specified abnormalities of plasma proteins: Secondary | ICD-10-CM

## 2017-05-12 DIAGNOSIS — I447 Left bundle-branch block, unspecified: Secondary | ICD-10-CM | POA: Diagnosis present

## 2017-05-12 DIAGNOSIS — N2581 Secondary hyperparathyroidism of renal origin: Secondary | ICD-10-CM | POA: Diagnosis present

## 2017-05-12 DIAGNOSIS — Z87891 Personal history of nicotine dependence: Secondary | ICD-10-CM

## 2017-05-12 DIAGNOSIS — Z992 Dependence on renal dialysis: Secondary | ICD-10-CM | POA: Diagnosis not present

## 2017-05-12 DIAGNOSIS — I4819 Other persistent atrial fibrillation: Secondary | ICD-10-CM | POA: Diagnosis present

## 2017-05-12 DIAGNOSIS — M898X9 Other specified disorders of bone, unspecified site: Secondary | ICD-10-CM | POA: Diagnosis present

## 2017-05-12 DIAGNOSIS — I6522 Occlusion and stenosis of left carotid artery: Secondary | ICD-10-CM | POA: Diagnosis present

## 2017-05-12 DIAGNOSIS — Z950 Presence of cardiac pacemaker: Secondary | ICD-10-CM

## 2017-05-12 DIAGNOSIS — N186 End stage renal disease: Secondary | ICD-10-CM

## 2017-05-12 DIAGNOSIS — H3530 Unspecified macular degeneration: Secondary | ICD-10-CM | POA: Diagnosis present

## 2017-05-12 DIAGNOSIS — I071 Rheumatic tricuspid insufficiency: Secondary | ICD-10-CM | POA: Diagnosis present

## 2017-05-12 DIAGNOSIS — Z45018 Encounter for adjustment and management of other part of cardiac pacemaker: Secondary | ICD-10-CM

## 2017-05-12 DIAGNOSIS — D631 Anemia in chronic kidney disease: Secondary | ICD-10-CM | POA: Diagnosis present

## 2017-05-12 DIAGNOSIS — K219 Gastro-esophageal reflux disease without esophagitis: Secondary | ICD-10-CM | POA: Diagnosis present

## 2017-05-12 DIAGNOSIS — F329 Major depressive disorder, single episode, unspecified: Secondary | ICD-10-CM | POA: Diagnosis present

## 2017-05-12 DIAGNOSIS — R404 Transient alteration of awareness: Secondary | ICD-10-CM | POA: Diagnosis not present

## 2017-05-12 DIAGNOSIS — R55 Syncope and collapse: Secondary | ICD-10-CM | POA: Diagnosis present

## 2017-05-12 DIAGNOSIS — G5603 Carpal tunnel syndrome, bilateral upper limbs: Secondary | ICD-10-CM | POA: Diagnosis present

## 2017-05-12 DIAGNOSIS — Z7952 Long term (current) use of systemic steroids: Secondary | ICD-10-CM

## 2017-05-12 DIAGNOSIS — H548 Legal blindness, as defined in USA: Secondary | ICD-10-CM | POA: Diagnosis present

## 2017-05-12 DIAGNOSIS — R001 Bradycardia, unspecified: Secondary | ICD-10-CM | POA: Diagnosis present

## 2017-05-12 DIAGNOSIS — E785 Hyperlipidemia, unspecified: Secondary | ICD-10-CM | POA: Diagnosis present

## 2017-05-12 DIAGNOSIS — Z8249 Family history of ischemic heart disease and other diseases of the circulatory system: Secondary | ICD-10-CM

## 2017-05-12 DIAGNOSIS — R748 Abnormal levels of other serum enzymes: Secondary | ICD-10-CM | POA: Diagnosis not present

## 2017-05-12 DIAGNOSIS — I480 Paroxysmal atrial fibrillation: Secondary | ICD-10-CM | POA: Diagnosis present

## 2017-05-12 DIAGNOSIS — I951 Orthostatic hypotension: Secondary | ICD-10-CM | POA: Diagnosis present

## 2017-05-12 DIAGNOSIS — Z880 Allergy status to penicillin: Secondary | ICD-10-CM

## 2017-05-12 DIAGNOSIS — D649 Anemia, unspecified: Secondary | ICD-10-CM | POA: Diagnosis present

## 2017-05-12 DIAGNOSIS — R7989 Other specified abnormal findings of blood chemistry: Secondary | ICD-10-CM

## 2017-05-12 DIAGNOSIS — H401120 Primary open-angle glaucoma, left eye, stage unspecified: Secondary | ICD-10-CM | POA: Diagnosis present

## 2017-05-12 DIAGNOSIS — Z823 Family history of stroke: Secondary | ICD-10-CM

## 2017-05-12 DIAGNOSIS — Z952 Presence of prosthetic heart valve: Secondary | ICD-10-CM

## 2017-05-12 DIAGNOSIS — Z87442 Personal history of urinary calculi: Secondary | ICD-10-CM

## 2017-05-12 DIAGNOSIS — Z7901 Long term (current) use of anticoagulants: Secondary | ICD-10-CM

## 2017-05-12 DIAGNOSIS — H43819 Vitreous degeneration, unspecified eye: Secondary | ICD-10-CM | POA: Diagnosis present

## 2017-05-12 DIAGNOSIS — E1122 Type 2 diabetes mellitus with diabetic chronic kidney disease: Secondary | ICD-10-CM | POA: Diagnosis present

## 2017-05-12 DIAGNOSIS — Z885 Allergy status to narcotic agent status: Secondary | ICD-10-CM

## 2017-05-12 DIAGNOSIS — Z800 Family history of malignant neoplasm of digestive organs: Secondary | ICD-10-CM

## 2017-05-12 DIAGNOSIS — Z8673 Personal history of transient ischemic attack (TIA), and cerebral infarction without residual deficits: Secondary | ICD-10-CM

## 2017-05-12 DIAGNOSIS — Z86010 Personal history of colonic polyps: Secondary | ICD-10-CM

## 2017-05-12 DIAGNOSIS — K5790 Diverticulosis of intestine, part unspecified, without perforation or abscess without bleeding: Secondary | ICD-10-CM | POA: Diagnosis present

## 2017-05-12 DIAGNOSIS — Z947 Corneal transplant status: Secondary | ICD-10-CM

## 2017-05-12 DIAGNOSIS — Z888 Allergy status to other drugs, medicaments and biological substances status: Secondary | ICD-10-CM

## 2017-05-12 DIAGNOSIS — I953 Hypotension of hemodialysis: Secondary | ICD-10-CM | POA: Diagnosis present

## 2017-05-12 DIAGNOSIS — I2510 Atherosclerotic heart disease of native coronary artery without angina pectoris: Secondary | ICD-10-CM | POA: Diagnosis present

## 2017-05-12 HISTORY — DX: End stage renal disease: N18.6

## 2017-05-12 HISTORY — DX: Dependence on renal dialysis: Z99.2

## 2017-05-12 HISTORY — DX: Personal history of urinary calculi: Z87.442

## 2017-05-12 LAB — BASIC METABOLIC PANEL
Anion gap: 14 (ref 5–15)
BUN: 25 mg/dL — ABNORMAL HIGH (ref 6–20)
CHLORIDE: 95 mmol/L — AB (ref 101–111)
CO2: 29 mmol/L (ref 22–32)
CREATININE: 6.2 mg/dL — AB (ref 0.61–1.24)
Calcium: 9.7 mg/dL (ref 8.9–10.3)
GFR calc non Af Amer: 8 mL/min — ABNORMAL LOW (ref >60)
GFR, EST AFRICAN AMERICAN: 9 mL/min — AB (ref >60)
Glucose, Bld: 80 mg/dL (ref 65–99)
POTASSIUM: 3.5 mmol/L (ref 3.5–5.1)
SODIUM: 138 mmol/L (ref 135–145)

## 2017-05-12 LAB — I-STAT TROPONIN, ED
TROPONIN I, POC: 0.09 ng/mL — AB (ref 0.00–0.08)
Troponin i, poc: 0.09 ng/mL (ref 0.00–0.08)

## 2017-05-12 LAB — CBC WITH DIFFERENTIAL/PLATELET
BASOS ABS: 0 10*3/uL (ref 0.0–0.1)
Basophils Relative: 0 %
EOS PCT: 1 %
Eosinophils Absolute: 0.1 10*3/uL (ref 0.0–0.7)
HEMATOCRIT: 36.7 % — AB (ref 39.0–52.0)
Hemoglobin: 11.6 g/dL — ABNORMAL LOW (ref 13.0–17.0)
LYMPHS ABS: 2 10*3/uL (ref 0.7–4.0)
LYMPHS PCT: 18 %
MCH: 32 pg (ref 26.0–34.0)
MCHC: 31.6 g/dL (ref 30.0–36.0)
MCV: 101.1 fL — AB (ref 78.0–100.0)
MONO ABS: 0.7 10*3/uL (ref 0.1–1.0)
Monocytes Relative: 7 %
Neutro Abs: 8 10*3/uL — ABNORMAL HIGH (ref 1.7–7.7)
Neutrophils Relative %: 74 %
PLATELETS: 187 10*3/uL (ref 150–400)
RBC: 3.63 MIL/uL — AB (ref 4.22–5.81)
RDW: 20.2 % — ABNORMAL HIGH (ref 11.5–15.5)
WBC: 10.9 10*3/uL — AB (ref 4.0–10.5)

## 2017-05-12 LAB — CBG MONITORING, ED: Glucose-Capillary: 82 mg/dL (ref 65–99)

## 2017-05-12 LAB — GLUCOSE, CAPILLARY: Glucose-Capillary: 81 mg/dL (ref 65–99)

## 2017-05-12 LAB — PROTIME-INR
INR: 2.09
Prothrombin Time: 23.3 seconds — ABNORMAL HIGH (ref 11.4–15.2)

## 2017-05-12 MED ORDER — INSULIN ASPART 100 UNIT/ML ~~LOC~~ SOLN
0.0000 [IU] | Freq: Three times a day (TID) | SUBCUTANEOUS | Status: DC
  Administered 2017-05-14: 1 [IU] via SUBCUTANEOUS

## 2017-05-12 MED ORDER — METOPROLOL TARTRATE 50 MG PO TABS
50.0000 mg | ORAL_TABLET | Freq: Two times a day (BID) | ORAL | Status: DC
  Administered 2017-05-13 – 2017-05-14 (×3): 50 mg via ORAL
  Filled 2017-05-12 (×3): qty 1

## 2017-05-12 MED ORDER — BROMOCRIPTINE MESYLATE 2.5 MG PO TABS
5.0000 mg | ORAL_TABLET | Freq: Every day | ORAL | Status: DC
  Administered 2017-05-13 (×2): 5 mg via ORAL
  Filled 2017-05-12 (×2): qty 2

## 2017-05-12 MED ORDER — PANTOPRAZOLE SODIUM 40 MG PO TBEC
40.0000 mg | DELAYED_RELEASE_TABLET | Freq: Every day | ORAL | Status: DC
  Administered 2017-05-13 – 2017-05-14 (×2): 40 mg via ORAL
  Filled 2017-05-12 (×2): qty 1

## 2017-05-12 MED ORDER — WARFARIN SODIUM 5 MG PO TABS
5.0000 mg | ORAL_TABLET | ORAL | Status: DC
  Administered 2017-05-13: 5 mg via ORAL
  Filled 2017-05-12: qty 1

## 2017-05-12 MED ORDER — POLYETHYLENE GLYCOL 3350 17 G PO PACK
17.0000 g | PACK | Freq: Every day | ORAL | Status: DC
  Administered 2017-05-14: 17 g via ORAL
  Filled 2017-05-12: qty 1

## 2017-05-12 MED ORDER — RENA-VITE PO TABS
1.0000 | ORAL_TABLET | Freq: Every day | ORAL | Status: DC
  Administered 2017-05-13 (×2): 1 via ORAL
  Filled 2017-05-12 (×2): qty 1

## 2017-05-12 MED ORDER — MIDODRINE HCL 5 MG PO TABS: 10.0000 mg | ORAL_TABLET | ORAL | Status: DC

## 2017-05-12 MED ORDER — SERTRALINE HCL 50 MG PO TABS
50.0000 mg | ORAL_TABLET | Freq: Every day | ORAL | Status: DC
  Administered 2017-05-14: 50 mg via ORAL
  Filled 2017-05-12: qty 1

## 2017-05-12 MED ORDER — ONDANSETRON HCL 4 MG PO TABS: 4.0000 mg | ORAL_TABLET | Freq: Four times a day (QID) | ORAL | Status: DC | PRN

## 2017-05-12 MED ORDER — DORZOLAMIDE HCL-TIMOLOL MAL 2-0.5 % OP SOLN
1.0000 [drp] | Freq: Two times a day (BID) | OPHTHALMIC | Status: DC
  Administered 2017-05-13 (×2): 1 [drp] via OPHTHALMIC
  Filled 2017-05-12: qty 10

## 2017-05-12 MED ORDER — CINACALCET HCL 30 MG PO TABS
30.0000 mg | ORAL_TABLET | Freq: Every evening | ORAL | Status: DC
  Administered 2017-05-13: 30 mg via ORAL
  Filled 2017-05-12: qty 1

## 2017-05-12 MED ORDER — ACETAMINOPHEN 650 MG RE SUPP: 650.0000 mg | Freq: Four times a day (QID) | RECTAL | Status: DC | PRN

## 2017-05-12 MED ORDER — WARFARIN - PHARMACIST DOSING INPATIENT: Freq: Every day | Status: DC

## 2017-05-12 MED ORDER — NEPRO/CARBSTEADY PO LIQD
237.0000 mL | ORAL | Status: DC
  Administered 2017-05-13: 237 mL via ORAL
  Filled 2017-05-12: qty 237

## 2017-05-12 MED ORDER — CALCIUM ACETATE (PHOS BINDER) 667 MG PO CAPS
2668.0000 mg | ORAL_CAPSULE | Freq: Three times a day (TID) | ORAL | Status: DC
  Administered 2017-05-13 – 2017-05-14 (×5): 2668 mg via ORAL
  Filled 2017-05-12 (×6): qty 4

## 2017-05-12 MED ORDER — WARFARIN SODIUM 2.5 MG PO TABS
2.5000 mg | ORAL_TABLET | ORAL | Status: DC
  Administered 2017-05-13: 2.5 mg via ORAL
  Filled 2017-05-12: qty 1

## 2017-05-12 MED ORDER — DIGOXIN 125 MCG PO TABS
0.0625 mg | ORAL_TABLET | Freq: Every day | ORAL | Status: DC
  Administered 2017-05-13 – 2017-05-14 (×2): 0.0625 mg via ORAL
  Filled 2017-05-12 (×2): qty 1

## 2017-05-12 MED ORDER — SENNOSIDES-DOCUSATE SODIUM 8.6-50 MG PO TABS: 1.0000 | ORAL_TABLET | Freq: Every evening | ORAL | Status: DC | PRN

## 2017-05-12 MED ORDER — ONDANSETRON HCL 4 MG/2ML IJ SOLN: 4.0000 mg | Freq: Four times a day (QID) | INTRAMUSCULAR | Status: DC | PRN

## 2017-05-12 MED ORDER — ACETAMINOPHEN 325 MG PO TABS: 650.0000 mg | ORAL_TABLET | Freq: Four times a day (QID) | ORAL | Status: DC | PRN

## 2017-05-12 MED ORDER — LORATADINE 10 MG PO TABS
10.0000 mg | ORAL_TABLET | Freq: Every day | ORAL | Status: DC
Start: 2017-05-13 — End: 2017-05-14
  Administered 2017-05-13 – 2017-05-14 (×2): 10 mg via ORAL
  Filled 2017-05-12 (×2): qty 1

## 2017-05-12 MED ORDER — MIDODRINE HCL 5 MG PO TABS: 5.0000 mg | ORAL_TABLET | Freq: Every day | ORAL | Status: DC | PRN

## 2017-05-12 MED ORDER — PREDNISONE 5 MG PO TABS
5.0000 mg | ORAL_TABLET | Freq: Every day | ORAL | Status: DC
  Administered 2017-05-13 (×2): 5 mg via ORAL
  Filled 2017-05-12 (×2): qty 1

## 2017-05-12 NOTE — Telephone Encounter (Signed)
New Message    [name] called in to advise that her dad has been taken to [facility] due to fainting. She is requesting a callback because she has some additional questions. He fainted twice today.

## 2017-05-12 NOTE — ED Notes (Signed)
I-stat troponin result given to Dr. Tomi Bamberger

## 2017-05-12 NOTE — ED Notes (Signed)
I-stat troponin given to Dr. Tomi Bamberger

## 2017-05-12 NOTE — ED Notes (Signed)
Patient transported to X-ray 

## 2017-05-12 NOTE — ED Notes (Signed)
Patient transported to CT 

## 2017-05-12 NOTE — ED Provider Notes (Signed)
[facility] EMERGENCY DEPARTMENT Provider Note   CSN: [id number] Arrival date & time: 05/12/17  1457    History   Chief Complaint Chief Complaint  Patient presents with  . Fall  . Near Syncope    HPI Alexander RODELO is a 77 y.o. male.  HPI Patient presented to the emergency room for evaluation of a near syncopal event.  Patient's had 2 episodes today.  The first episode occurred this morning when he had been sitting down.  He went to stand up and felt very lightheaded.  He did not fall or lose consciousness.  His symptoms resolved and he carried on his daily activities.  Later in the day he was getting out of the car on the way to his dentist office.  He stood up and suddenly felt lightheaded and dizzy as he was getting out of the car.  He fell to the ground striking the back of his head.  Patient does not feel like he actually lost consciousness.  He denies any trouble with headache.  No chest pain or abdominal pain.  He denies any fall.  Right now he feels back to normal.  He denies any history of syncopal episodes in the past.  He does have a history of low blood pressure when he gets dialysis.  His last treatment was yesterday.  He takes Midodrin for intermittent hypotension.  After the episode of dizziness he did take 3 of his Midodrin tablets Past Medical History:  Diagnosis Date  . Anemia   . Aortic stenosis 06/15/12   TEE - EF 40-98%; grade 1 diastolic dysfunction; mild/mod aortic valve stenosis; Mitral valve had calcified annulus, mild pulm htn PA peak pressure 89mmHg  . Barrett's esophagus 05/2003  . Bradycardia 2017   [employer] 2240 Assurity dual-lead pacemaker  . Carpal tunnel syndrome, bilateral 11/03/2015  . Colon polyps   . CVA (cerebral infarction)    2004/affected left side  . Depression   . Diabetes mellitus without complication ([place])   . Diabetic peripheral neuropathy ([place]) 10/02/2015  . Diverticulosis   . Dyspnea    with exertion  . End  stage renal disease ([place])    hemodialysis 3 times a week  . GERD (gastroesophageal reflux disease)   . Glaucoma   . Hyperlipidemia   . Hypertension   . Kidney stones   . Legally blind   . Macular degeneration    both eyes  . Orthostatic hypotension 09/09/2015  . Paroxysmal atrial fibrillation (HCC)   . Peptic ulcer    bleeding, 1969  . Presence of permanent cardiac pacemaker   . S/P epidural steroid injection    last  injection over 10 years ago  . Seasonal allergies   . Tubular adenoma of colon 07/2001    Patient Active Problem List   Diagnosis Date Noted  . CVA (cerebral vascular accident) ([place]) 04/12/2017  . Dialysis patient ([place]) 04/12/2017  . Seasonal allergies   . S/P epidural steroid injection   . Peptic ulcer   . Paroxysmal atrial fibrillation (HCC)   . Macular degeneration   . Legally blind   . Kidney stones   . Hypertension   . ESRD (end stage renal disease) ([place])   . Dyspnea   . Diverticulosis   . Diabetes mellitus without complication ([place])   . Colon polyps   . Anemia   . Presence of permanent cardiac pacemaker 11/24/2016  . Streptococcal bacteremia 11/24/2016  . Atrial fibrillation with RVR ([place])  11/23/2016  . Glaucoma 11/23/2016  . GERD (gastroesophageal reflux disease) 11/23/2016  . Hypomagnesemia 11/23/2016  . Diabetes mellitus, type II ([place]) 11/23/2016  . Depression 11/23/2016  . SIRS (systemic inflammatory response syndrome) ([place]) 11/23/2016  . S/P TAVR (transcatheter aortic valve replacement) 08/31/2016  . Long term (current) use of anticoagulants [Z79.01] 08/13/2016  . Anticoagulated on Coumadin 01/19/2016  . Impacted cerumen of left ear 01/19/2016  . Otorrhagia of right ear 01/19/2016  . Sudden right hearing loss 01/19/2016  . Persistent atrial fibrillation ([place])   . Carotid occlusion, left 11/04/2015  . Carpal tunnel syndrome, bilateral 11/03/2015  . Diabetic peripheral neuropathy ([place]) 10/02/2015  . Orthostatic hypotension 09/09/2015  .  Bradycardia 03/30/2015  . OAG (open angle glaucoma) suspect, high risk, left 05/28/2014  . Long-term (current) use of anticoagulants 04/08/2014  . Hyperlipidemia 04/04/2014  . Primary open-angle glaucoma 07/10/2013  . ESRD (end stage renal disease) on dialysis ([place]) 03/13/2013  . Pseudophakia 01/02/2013  . PVD (posterior vitreous detachment) 01/02/2013  . PAD (peripheral artery disease) ([place]) 12/12/2012  . Occlusion and stenosis of bilateral carotid arteries 08/17/2012  . Severe aortic stenosis 06/15/2012  . Aortic stenosis 06/15/2012  . Organ transplant candidate 01/20/2012  . Exudative macular degeneration ([place]) 12/02/2011  . Retinal detachment 12/02/2011  . Personal history of colonic polyps 02/03/2011  . Esophageal reflux 02/03/2011  . Anemia, unspecified 02/03/2011  . Barrett's esophagus 05/28/2003  . Tubular adenoma of colon 07/27/2001    Past Surgical History:  Procedure Laterality Date  . AV FISTULA PLACEMENT  2009  . BACK SURGERY    . CARDIOVERSION N/A 11/13/2015   Procedure: CARDIOVERSION;  Surgeon: Troy Sine, MD;  [facility] ENDOSCOPY;  Service: Cardiovascular;  Laterality: N/A;  . CARDIOVERSION N/A 01/13/2016   Procedure: CARDIOVERSION;  Surgeon: Will Meredith [name], MD;  Location: [facility];  Service: Cardiovascular;  Laterality: N/A;  . [place]   right eye  . CYSTOSCOPY  several times   kidney stones  . EP IMPLANTABLE DEVICE N/A 03/11/2015   Procedure: Pacemaker Implant;  Surgeon: Will Meredith [name], MD;  [place];  Laterality: Left  . LAMINECTOMY  1969  . RIGHT/LEFT HEART CATH AND CORONARY ANGIOGRAPHY N/A 08/20/2016   Procedure: Right/Left Heart Cath and Coronary Angiography;  Surgeon: Burnell Blanks, MD;  Location: [facility] CV LAB;  Service: Cardiovascular;  Laterality: N/A;  . TEE WITHOUT CARDIOVERSION N/A 07/22/2016   Procedure: TRANSESOPHAGEAL ECHOCARDIOGRAM (TEE);  Surgeon: Skeet Latch, MD;   Location: [facility];  Service: Cardiovascular;  Laterality: N/A;  . TEE WITHOUT CARDIOVERSION N/A 08/31/2016   Procedure: TRANSESOPHAGEAL ECHOCARDIOGRAM (TEE);  Surgeon: Burnell Blanks, MD;  Location: [facility];  Service: Open Heart Surgery;  Laterality: N/A;  . TEE WITHOUT CARDIOVERSION N/A 12/01/2016   Procedure: TRANSESOPHAGEAL ECHOCARDIOGRAM (TEE);  Surgeon: Larey Dresser, MD;  Location: [facility] ENDOSCOPY;  Service: Cardiovascular;  Laterality: N/A;  . TONSILLECTOMY  1964  . TRANSCATHETER AORTIC VALVE REPLACEMENT, TRANSFEMORAL N/A 08/31/2016   Procedure: TRANSCATHETER AORTIC VALVE REPLACEMENT, TRANSFEMORAL;  Surgeon: Burnell Blanks, MD;  Location: [facility];  Service: Open Heart Surgery;  Laterality: N/A;       Home Medications    Prior to Admission medications   Medication Sig Start Date End Date Taking? Authorizing Provider  acetaminophen (TYLENOL) 325 MG tablet Take 2 tablets (650 mg total) by mouth every 6 (six) hours as needed for mild pain. 09/02/16   Nani Skillern, PA-C  atorvastatin (LIPITOR) 40  MG tablet Take 20 mg by mouth daily.    [provider]  b complex-vitamin c-folic acid (NEPHRO-VITE) 0.8 MG TABS Take 1 tablet by mouth daily.     [provider]  bromocriptine (PARLODEL) 5 MG capsule Take 5 mg by mouth at bedtime.  11/30/10   [provider]  calcium acetate (PHOSLO) 667 MG capsule Take 667 mg by mouth 4 (four) times daily.     [provider]  cetirizine (ZYRTEC) 10 MG tablet Take 10 mg by mouth at bedtime.     [provider]  cinacalcet (SENSIPAR) 30 MG tablet Take 30 mg by mouth every evening.     [provider]  digoxin (LANOXIN) 0.125 MG tablet 1/2 TABLET BY MOUTH DAILY 04/29/17   Skeet Latch, MD  dorzolamide-timolol (COSOPT) 22.3-6.8 MG/ML ophthalmic solution Place 1 drop into the right eye 2 (two) times daily. 08/13/13   [provider]  methocarbamol (ROBAXIN) 500 MG tablet Take 1  tablet (500 mg total) by mouth every 6 (six) hours as needed for muscle spasms. 12/02/16   Florencia Reasons, MD  metoprolol tartrate (LOPRESSOR) 50 MG tablet Take 1 tablet (50 mg total) by mouth 2 (two) times daily. 12/02/16   Florencia Reasons, MD  midodrine (PROAMATINE) 10 MG tablet Take 10 mg by mouth as directed. TAKE 1 TABLET PRIOR TO DIALYSIS. OK TO TAKE EXTRA 1/2 TAB FOR LOW BP PRIOR TO DIALYSIS. OK TO TAKE 15 MG AS NEEDED FOR LOW BP DURING DIALYSIS     [provider]  Nutritional Supplements (NEPRO) LIQD Take 237 mLs by mouth daily.    [provider]  omeprazole (PRILOSEC) 20 MG capsule Take 20 mg by mouth daily.    [provider]  polyethylene glycol (MIRALAX / GLYCOLAX) packet Take 17 g by mouth daily. 12/02/16   Florencia Reasons, MD  predniSONE (DELTASONE) 5 MG tablet Take 5 mg by mouth daily.  04/27/16   [provider]  senna-docusate (SENOKOT-S) 8.6-50 MG tablet Take 1 tablet by mouth at bedtime. 12/02/16   Florencia Reasons, MD  sertraline (ZOLOFT) 50 MG tablet Take 50 mg by mouth daily. 11/18/16   [provider]  Tiotropium Bromide-Olodaterol (STIOLTO RESPIMAT) 2.5-2.5 MCG/ACT AERS Inhale 2 puffs into the lungs daily. 01/27/17   Mannam, Hart Robinsons, MD  warfarin (COUMADIN) 5 MG tablet Take 1-1.5 tablets by mouth daily as directed by coumadin clinic Patient taking differently: Take 5 mg by mouth See admin instructions. Take 1 tablet (5mg  totally) by mouth daily. 03/08/16   Skeet Latch, MD    Family History Family History  Problem Relation Age of Onset  . Stomach cancer Mother   . Hypertension Father        Died of heart attack  . Heart attack Father   . Stroke Sister   . Heart disease Sister   . Cancer Brother   . Colon cancer Neg Hx     Social History Social History   Tobacco Use  . Smoking status: Former Smoker    Packs/day: 2.00    Years: 45.00    Pack years: 90.00    Types: Cigarettes    Last attempt to quit: 01/20/1998    Years since quitting: 19.3  .  Smokeless tobacco: Never Used  Substance Use Topics  . Alcohol use: No    Alcohol/week: 0.0 oz  . Drug use: No     Allergies   Penicillins; Codeine; and Tramadol   Review of Systems Review of Systems  All other systems reviewed and are negative.    Physical Exam Updated Vital Signs BP 128/80   Pulse 69   Temp 98.5 F (36.9 C) (Oral)   Resp 15   SpO2 (!) 87%   Physical Exam  Constitutional: He appears well-developed and well-nourished. No distress.  HENT:  Head: Normocephalic.  Right Ear: External ear normal.  Left Ear: External ear normal.  Contusion posterior occiput, no laceration  Eyes: Conjunctivae are normal. Right eye exhibits no discharge. Left eye exhibits no discharge. No scleral icterus.  Neck: Neck supple. No tracheal deviation present.  Cardiovascular: Normal rate, regular rhythm and intact distal pulses.  Pulmonary/Chest: Effort normal and breath sounds normal. No stridor. No respiratory distress. He has no wheezes. He has no rales.  Abdominal: Soft. Bowel sounds are normal. He exhibits no distension. There is no tenderness. There is no rebound and no guarding.  Musculoskeletal: He exhibits no edema or tenderness.       Right shoulder: He exhibits no tenderness, no bony tenderness and no swelling.       Left shoulder: He exhibits no tenderness, no bony tenderness and no swelling.       Right wrist: He exhibits no tenderness, no bony tenderness and no swelling.       Left wrist: He exhibits no tenderness, no bony tenderness and no swelling.       Right hip: He exhibits normal range of motion, no tenderness, no bony tenderness and no swelling.       Left hip: He exhibits normal range of motion, no tenderness and no bony tenderness.       Right ankle: He exhibits no swelling. No tenderness.       Left ankle: He exhibits no swelling. No tenderness.       Cervical back: He exhibits no tenderness, no bony tenderness and no swelling.       Thoracic back: He  exhibits no tenderness, no bony tenderness and no swelling.       Lumbar back: He exhibits no tenderness, no bony tenderness and no swelling.  Neurological: He is alert. He has normal strength. He is not disoriented. No cranial nerve deficit (no facial droop, extraocular movements intact, no slurred speech) or sensory deficit. He exhibits normal muscle tone. He displays no seizure activity. Coordination normal. GCS eye subscore is 4. GCS verbal subscore is 5. GCS motor subscore is 6.  Patient is able to lift both arms without difficulty, he is able to lift both legs without difficulty  Skin: Skin is warm and dry. No rash noted. He is not diaphoretic.  Psychiatric: He has a normal mood and affect.  Nursing note and vitals reviewed.    ED Treatments / Results  Labs (all labs ordered are listed, but only abnormal results are displayed) Labs Reviewed  CBC WITH DIFFERENTIAL/PLATELET - Abnormal; Notable for the following components:      Result Value   WBC 10.9 (*)    RBC 3.63 (*)    Hemoglobin 11.6 (*)    HCT 36.7 (*)    MCV 101.1 (*)    RDW 20.2 (*)    Neutro Abs 8.0 (*)    All other components within normal limits  PROTIME-INR - Abnormal; Notable for the following components:   Prothrombin Time 23.3 (*)    All other components within normal limits  BASIC METABOLIC PANEL - Abnormal; Notable for the following components:   Chloride 95 (*)    BUN 25 (*)  Creatinine, Ser 6.20 (*)    GFR calc non Af Amer 8 (*)    GFR calc Af Amer 9 (*)    All other components within normal limits  I-STAT TROPONIN, ED - Abnormal; Notable for the following components:   Troponin i, poc 0.09 (*)    All other components within normal limits  I-STAT TROPONIN, ED - Abnormal; Notable for the following components:   Troponin i, poc 0.09 (*)    All other components within normal limits  CBG MONITORING, ED    EKG  EKG Interpretation  Date/Time:  Thursday May 12 2017 16:34:23 EST Ventricular Rate:    70 PR Interval:    QRS Duration: 162 QT Interval:  465 QTC Calculation: 502 R Axis:   -45 Text Interpretation:  Sinus rhythm Short PR interval Left bundle branch block No significant change since last tracing Confirmed by Dorie Rank [phone]) on 05/12/2017 4:39:32 PM       Radiology Dg Chest 2 View  Result Date: 05/12/2017 CLINICAL DATA:  Syncopal episode EXAM: CHEST  2 VIEW COMPARISON:  04/28/2017 FINDINGS: Cardiac shadow is enlarged. Pacing device is again seen. Changes consistent with prior TAVR are noted and stable. The lungs are well aerated bilaterally. No focal infiltrate or sizable effusion is seen. No bony abnormality is noted. IMPRESSION: No acute abnormality noted. Electronically Signed   By: Inez Catalina M.D.   On: 05/12/2017 15:57   Ct Head Wo Contrast  Result Date: 05/12/2017 CLINICAL DATA:  Head injury after fall. Possible loss of consciousness. EXAM: CT HEAD WITHOUT CONTRAST TECHNIQUE: Contiguous axial images were obtained from the base of the skull through the vertex without intravenous contrast. COMPARISON:  CT scan of November 26, 2016. FINDINGS: Brain: Left occipital encephalomalacia is noted consistent with old infarction. Mild diffuse cortical atrophy is noted. Mild chronic ischemic white matter disease is noted. No mass effect or midline shift is noted. Ventricular size is within normal limits. There is no evidence of mass lesion, hemorrhage or acute infarction. Vascular: No hyperdense vessel or unexpected calcification. Skull: Normal. Negative for fracture or focal lesion. Sinuses/Orbits: No acute finding. Other: None. IMPRESSION: Old left occipital infarction. Mild diffuse cortical atrophy. Mild chronic ischemic white matter disease. No acute intracranial abnormality seen. Electronically Signed   By: Marijo Conception, M.D.   On: 05/12/2017 16:18    Procedures Procedures (including critical care time)  Medications Ordered in ED Medications - No data to display   Initial  Impression / Assessment and Plan / ED Course  I have reviewed the triage vital signs and the nursing notes.  Pertinent labs & imaging results that were available during my care of the patient were reviewed by me and considered in my medical decision making (see chart for details).  Clinical Course as of May 12 1849  Thu May 12, 2017  [place] show a stable anemia.  Electrolyte panel shows chronic renal failure but otherwise no worrisome electrolyte abnormalities.  CT scan without significant injuries.  [JK]  1851 Troponin is slightly elevated at 0.09.  Patient is without chest pain and serial troponins are unchanged  [JK]    Clinical Course User Index [JK] Dorie Rank, MD  Pt presents to the ED after two near syncopal episodes, with the last causing the pt to fall to the ground.  Patient is asymptomatic now.  The etiology of his syncope is unclear.  I am asked to have the pacemaker interrogated.  We are still waiting for that  report.  Considering the repetitive episodes he has had I think it is reasonable to bring him in for observation and cardiac monitoring.  Patient will need to have dialysis tomorrow.  I will consult with the medical service.  Final Clinical Impressions(s) / ED Diagnoses   Final diagnoses:  Near syncope  Minor head injury, initial encounter  Elevated troponin      Dorie Rank, MD 05/12/17 [phone]

## 2017-05-12 NOTE — ED Triage Notes (Signed)
Pt arrives via EMS from dentist with complaints of a fall after a near syncopal episode when getting out of the car. Pt endorses hitting back of his head and takes coumadin. Pt A&Ox4

## 2017-05-12 NOTE — Telephone Encounter (Signed)
Spoke with pt dtr, the patient fell this morning after getting lightheaded. He took 3 midodrine and it did not help. He is currently in the ER getting worked up because he hit his head and is on warfarin. She would like to make dr Oval Linsey aware. Will forward to dr Oval Linsey.

## 2017-05-12 NOTE — H&P (Addendum)
History and Physical    Alexander [name] NOM:[id number] DOB: 1941-01-09 DOA: 05/12/2017  PCP: Deland Pretty, MD  Patient coming from: Home.  Chief Complaint: Fall.  HPI: Alexander [name] is a 77 y.o. male with history of ESRD on hemodialysis, pacemaker placement for bradycardia, aortic stenosis status post TAVR, ESRD on hemodialysis on Monday Wednesday and Friday, CAD, diabetes mellitus, anemia was brought to the ER after patient had a fall at the dentist office.  Early in the morning before he went to the dentist office patient also had a fall/near syncopal episode.  Did not lose consciousness.  And after reaching the dentist office and was getting out of the car patient again fell but did not lose consciousness.  Recently on following up with cardiology office patient's Cardizem was discontinued due to low normal blood pressures and dizzy spells.  Patient has been placed on midodrine.  Did take his midodrine prior to the second near syncopal episode today.  Denies any chest pain shortness of breath nausea vomiting diarrhea.  After the fall he did hit his head.    ED Course: In the ER CT of the head did not show anything acute.  EKG shows A. fib with LBBB.  Troponin is mildly positive.  Patient denies any chest pain.  Patient admitted for further observation.  At the time of my exam patient complains of left sided neck pain.   Review of Systems: As per HPI, rest all negative.   Past Medical History:  Diagnosis Date  . Anemia   . Aortic stenosis 06/15/12   TEE - EF 96-28%; grade 1 diastolic dysfunction; mild/mod aortic valve stenosis; Mitral valve had calcified annulus, mild pulm htn PA peak pressure 73mmHg  . Barrett's esophagus 05/2003  . Bradycardia 2017   [employer] 2240 Assurity dual-lead pacemaker  . Carpal tunnel syndrome, bilateral 11/03/2015  . Colon polyps   . CVA (cerebral infarction)    2004/affected left side  . Depression   . Diabetes mellitus without complication ([place])     . Diabetic peripheral neuropathy ([place]) 10/02/2015  . Diverticulosis   . Dyspnea    with exertion  . ESRD (end stage renal disease) on dialysis ([place])    "[facility]; [place]; MWF" (05/12/2017)  . GERD (gastroesophageal reflux disease)   . Glaucoma   . History of kidney stones   . Hyperlipidemia   . Hypertension   . Legally blind   . Macular degeneration    both eyes  . Orthostatic hypotension 09/09/2015  . Paroxysmal atrial fibrillation (HCC)   . Peptic ulcer    bleeding, 1969  . Presence of permanent cardiac pacemaker   . S/P epidural steroid injection    last  injection over 10 years ago  . Seasonal allergies   . Tubular adenoma of colon 07/2001    Past Surgical History:  Procedure Laterality Date  . AV FISTULA PLACEMENT  2009  . BACK SURGERY    . CARDIOVERSION N/A 11/13/2015   Procedure: CARDIOVERSION;  Surgeon: Troy Sine, MD;  Location: [facility] ENDOSCOPY;  Service: Cardiovascular;  Laterality: N/A;  . CARDIOVERSION N/A 01/13/2016   Procedure: CARDIOVERSION;  Surgeon: Will Meredith [name], MD;  Location: [place];  Service: Cardiovascular;  Laterality: N/A;  . [place]   right eye  . CYSTOSCOPY  several times   kidney stones  . EP IMPLANTABLE DEVICE N/A 03/11/2015   Procedure: Pacemaker Implant;  Surgeon: Will Meredith [name], MD;  [place] [phone]  Assurity;  Laterality: Left  . LAMINECTOMY  1969  . RIGHT/LEFT HEART CATH AND CORONARY ANGIOGRAPHY N/A 08/20/2016   Procedure: Right/Left Heart Cath and Coronary Angiography;  Surgeon: Burnell Blanks, MD;  Location: [place] [facility];  Service: Cardiovascular;  Laterality: N/A;  . TEE WITHOUT CARDIOVERSION N/A 07/22/2016   Procedure: TRANSESOPHAGEAL ECHOCARDIOGRAM (TEE);  Surgeon: Skeet Latch, MD;  Location: [place];  Service: Cardiovascular;  Laterality: N/A;  . TEE WITHOUT CARDIOVERSION N/A 08/31/2016   Procedure: TRANSESOPHAGEAL ECHOCARDIOGRAM (TEE);  Surgeon: Burnell Blanks, MD;   Location: [place];  Service: Open Heart Surgery;  Laterality: N/A;  . TEE WITHOUT CARDIOVERSION N/A 12/01/2016   Procedure: TRANSESOPHAGEAL ECHOCARDIOGRAM (TEE);  Surgeon: Larey Dresser, MD;  Location: [facility] ENDOSCOPY;  Service: Cardiovascular;  Laterality: N/A;  . TONSILLECTOMY  1964  . TRANSCATHETER AORTIC VALVE REPLACEMENT, TRANSFEMORAL N/A 08/31/2016   Procedure: TRANSCATHETER AORTIC VALVE REPLACEMENT, TRANSFEMORAL;  Surgeon: Burnell Blanks, MD;  Location: [place];  Service: Open Heart Surgery;  Laterality: N/A;     reports that he quit smoking about 19 years ago. His smoking use included cigarettes. He has a 90.00 pack-year smoking history. he has never used smokeless tobacco. He reports that he does not drink alcohol or use drugs.  Allergies  Allergen Reactions  . Penicillins Swelling and Rash    Has patient had a PCN reaction causing immediate rash, facial/tongue/throat swelling, SOB or lightheadedness with hypotension: Yes Has patient had a PCN reaction causing severe rash involving mucus membranes or skin necrosis: No Has patient had a PCN reaction that required hospitalization: No Has patient had a PCN reaction occurring within the last 10 years: No If all of the above answers are "NO", then may proceed with Cephalosporin use.   . Codeine Nausea Only  . Tramadol Nausea Only    Family History  Problem Relation Age of Onset  . Stomach cancer Mother   . Hypertension Father        Died of heart attack  . Heart attack Father   . Stroke Sister   . Heart disease Sister   . Cancer Brother   . Colon cancer Neg Hx     Prior to Admission medications   Medication Sig Start Date End Date Taking? Authorizing Provider  acetaminophen (TYLENOL) 325 MG tablet Take 2 tablets (650 mg total) by mouth every 6 (six) hours as needed for mild pain. Patient taking differently: Take 650 mg by mouth every 6 (six) hours.  09/02/16  Yes Lars Pinks M, PA-C  b complex-vitamin c-folic acid  (NEPHRO-VITE) 0.8 MG TABS Take 1 tablet by mouth daily.    Yes [provider]  bromocriptine (PARLODEL) 5 MG capsule Take 5 mg by mouth at bedtime.  11/30/10  Yes [provider]  calcium acetate (PHOSLO) 667 MG capsule Take 2,668 mg by mouth 4 (four) times daily.    Yes [provider]  cetirizine (ZYRTEC) 10 MG tablet Take 10 mg by mouth at bedtime.    Yes [provider]  cinacalcet (SENSIPAR) 30 MG tablet Take 30 mg by mouth every evening.    Yes [provider]  clindamycin (CLEOCIN) 150 MG capsule Take 2,000 mg by mouth See admin instructions. Take 2,000 mg by mouth one hour prior to dental procedure 03/31/17  Yes [provider]  digoxin (LANOXIN) 0.125 MG tablet 1/2 TABLET BY MOUTH DAILY Patient taking differently: Take 0.0625 mg by mouth daily.  04/29/17  Yes Skeet Latch, MD  dorzolamide-timolol (  COSOPT) 22.3-6.8 MG/ML ophthalmic solution Place 1 drop into both eyes 2 (two) times daily.  08/13/13  Yes [provider]  methocarbamol (ROBAXIN) 500 MG tablet Take 1 tablet (500 mg total) by mouth every 6 (six) hours as needed for muscle spasms. 12/02/16  Yes Florencia Reasons, MD  metoprolol tartrate (LOPRESSOR) 50 MG tablet Take 1 tablet (50 mg total) by mouth 2 (two) times daily. 12/02/16  Yes Florencia Reasons, MD  midodrine (PROAMATINE) 10 MG tablet Take 10 mg by mouth See admin instructions. 10 mg by mouth prior to dialysis and an extra 5 mg as needed for low b/p prior to dialysis (may also take 15 mg as needed for low b/p during dialysis)   Yes [provider]  midodrine (PROAMATINE) 5 MG tablet Take 5-15 mg by mouth daily as needed (for a sudden drop in blood pressure).    Yes [provider]  Nutritional Supplements (NEPRO) LIQD Take 237 mLs by mouth every Monday, Wednesday, and Friday with hemodialysis.    Yes [provider]  omeprazole (PRILOSEC) 20 MG capsule Take 20 mg by mouth daily.   Yes [provider]    polyethylene glycol (MIRALAX / GLYCOLAX) packet Take 17 g by mouth daily. Patient taking differently: Take 17 g by mouth daily as needed for mild constipation.  12/02/16  Yes Florencia Reasons, MD  predniSONE (DELTASONE) 5 MG tablet Take 5 mg by mouth at bedtime.  04/27/16  Yes [provider]  senna-docusate (SENOKOT-S) 8.6-50 MG tablet Take 1 tablet by mouth at bedtime. Patient taking differently: Take 1 tablet by mouth at bedtime as needed for mild constipation.  12/02/16  Yes Florencia Reasons, MD  sertraline (ZOLOFT) 50 MG tablet Take 50 mg by mouth daily. 11/18/16  Yes [provider]  Tiotropium Bromide-Olodaterol (STIOLTO RESPIMAT) 2.5-2.5 MCG/ACT AERS Inhale 2 puffs into the lungs daily. Patient taking differently: Inhale 2 puffs into the lungs 2 (two) times daily.  01/27/17  Yes Mannam, Praveen, MD  warfarin (COUMADIN) 5 MG tablet Take 1-1.5 tablets by mouth daily as directed by coumadin clinic Patient taking differently: Take 2.5-5 mg by mouth See admin instructions. 5 mg by mouth at night on Sun/Tues/Thurs/Sat and 2.5 mg on Mon/Wed/Fri 03/08/16  Yes Skeet Latch, MD  atorvastatin (LIPITOR) 40 MG tablet Take 20 mg by mouth daily.    [provider]    Physical Exam: Vitals:   05/12/17 1930 05/12/17 2000 05/12/17 2030 05/12/17 2100  BP: (!) 150/97 (!) 143/67 138/69 (!) 142/69  Pulse: 70 70 69 71  Resp: (!) 25 14 18 15   Temp:      TempSrc:      SpO2: 94% 93% 97% 94%      Constitutional: Moderately built and nourished. Vitals:   05/12/17 1930 05/12/17 2000 05/12/17 2030 05/12/17 2100  BP: (!) 150/97 (!) 143/67 138/69 (!) 142/69  Pulse: 70 70 69 71  Resp: (!) 25 14 18 15   Temp:      TempSrc:      SpO2: 94% 93% 97% 94%   Eyes: Anicteric no pallor. ENMT: No discharge from the ears eyes nose or mouth. Neck: No neck rigidity.  No definite mass felt.  Patient complains of pain around the left jugular digastric area do not see any mass palpable.  No inflammation inside  the throat. Respiratory: No rhonchi or crepitations. Cardiovascular: S1-S2 heard no murmurs appreciated. Abdomen: Soft nontender bowel sounds present. Musculoskeletal: No edema.  No joint effusion. Skin: No rash.  Neurologic: Alert awake oriented to time place and person.  Moves all extremities 5 x 5. Psychiatric: Appears normal.  Normal affect.   Labs on Admission: I have personally reviewed following labs and imaging studies  CBC: Recent Labs  Lab 05/12/17 1516  WBC 10.9*  NEUTROABS 8.0*  HGB 11.6*  HCT 36.7*  MCV 101.1*  PLT 025   Basic Metabolic Panel: Recent Labs  Lab 05/12/17 1516  NA 138  K 3.5  CL 95*  CO2 29  GLUCOSE 80  BUN 25*  CREATININE 6.20*  CALCIUM 9.7   GFR: CrCl cannot be calculated (Unknown ideal weight.). Liver Function Tests: No results for input(s): AST, ALT, ALKPHOS, BILITOT, PROT, ALBUMIN in the last 168 hours. No results for input(s): LIPASE, AMYLASE in the last 168 hours. No results for input(s): AMMONIA in the last 168 hours. Coagulation Profile: Recent Labs  Lab 05/12/17 1516  INR 2.09   Cardiac Enzymes: No results for input(s): CKTOTAL, CKMB, CKMBINDEX, TROPONINI in the last 168 hours. BNP (last 3 results) Recent Labs    11/11/16 1143  PROBNP 18,028*   HbA1C: No results for input(s): HGBA1C in the last 72 hours. CBG: Recent Labs  Lab 05/12/17 1625 05/12/17 2210  GLUCAP 82 81   Lipid Profile: No results for input(s): CHOL, HDL, LDLCALC, TRIG, CHOLHDL, LDLDIRECT in the last 72 hours. Thyroid Function Tests: No results for input(s): TSH, T4TOTAL, FREET4, T3FREE, THYROIDAB in the last 72 hours. Anemia Panel: No results for input(s): VITAMINB12, FOLATE, FERRITIN, TIBC, IRON, RETICCTPCT in the last 72 hours. Urine analysis:    Component Value Date/Time   COLORURINE YELLOW 11/12/2014 1318   APPEARANCEUR CLEAR 11/12/2014 1318   LABSPEC 1.016 11/12/2014 1318   PHURINE 5.5 11/12/2014 1318   GLUCOSEU NEGATIVE 11/12/2014  1318   HGBUR MODERATE (A) 11/12/2014 1318   BILIRUBINUR SMALL (A) 11/12/2014 1318   KETONESUR NEGATIVE 11/12/2014 1318   PROTEINUR 100 (A) 11/12/2014 1318   UROBILINOGEN 0.2 11/12/2014 1318   NITRITE NEGATIVE 11/12/2014 1318   LEUKOCYTESUR MODERATE (A) 11/12/2014 1318   Sepsis Labs: @LABRCNTIP (procalcitonin:4,lacticidven:4) )No results found for this or any previous visit (from the past 240 hour(s)).   Radiological Exams on Admission: Dg Chest 2 View  Result Date: 05/12/2017 CLINICAL DATA:  Syncopal episode EXAM: CHEST  2 VIEW COMPARISON:  04/28/2017 FINDINGS: Cardiac shadow is enlarged. Pacing device is again seen. Changes consistent with prior TAVR are noted and stable. The lungs are well aerated bilaterally. No focal infiltrate or sizable effusion is seen. No bony abnormality is noted. IMPRESSION: No acute abnormality noted. Electronically Signed   By: Inez Catalina M.D.   On: 05/12/2017 15:57   Ct Head Wo Contrast  Result Date: 05/12/2017 CLINICAL DATA:  Head injury after fall. Possible loss of consciousness. EXAM: CT HEAD WITHOUT CONTRAST TECHNIQUE: Contiguous axial images were obtained from the base of the skull through the vertex without intravenous contrast. COMPARISON:  CT scan of November 26, 2016. FINDINGS: Brain: Left occipital encephalomalacia is noted consistent with old infarction. Mild diffuse cortical atrophy is noted. Mild chronic ischemic white matter disease is noted. No mass effect or midline shift is noted. Ventricular size is within normal limits. There is no evidence of mass lesion, hemorrhage or acute infarction. Vascular: No hyperdense vessel or unexpected calcification. Skull: Normal. Negative for fracture or focal lesion. Sinuses/Orbits: No acute finding. Other: None. IMPRESSION: Old left occipital infarction. Mild diffuse cortical atrophy. Mild chronic ischemic white matter disease. No acute intracranial abnormality seen. Electronically Signed  By: Marijo Conception, M.D.    On: 05/12/2017 16:18    EKG: Independently reviewed.  A. fib with LBBB.  Assessment/Plan Principal Problem:   Near syncope Active Problems:   Anemia, unspecified   ESRD (end stage renal disease) on dialysis (HCC)   Persistent atrial fibrillation (HCC)   S/P TAVR (transcatheter aortic valve replacement)   Orthostatic hypotension   Macular degeneration   PVD (posterior vitreous detachment)   Elevated troponin    1. Near syncopal episodes -we will monitor in telemetry.  Check orthostatic.  Get physical therapy consult.  Since patient's troponin is mildly elevated will discuss with cardiologist in the morning.  Pacemaker interrogation was requested.  Report is yet to arrive. 2. Elevated troponin denies any chest pain.  Patient is on Coumadin.  Cycle cardiac markers. 3. History of TAVR -2D echo done in September showed normal functioning valve.  EF was 50%. 4. ESRD on hemodialysis on Monday Wednesday and.  Due for dialysis on Friday. 5. Anemia likely from ESRD.  Follow CBC. 6. A. fib on digoxin and metoprolol which will be continued.  Check digoxin levels.  Chads 2 vas score is at least more than 2.  Patient on Coumadin per pharmacy. 7. History of CAD -mildly elevated troponin.  Patient denies any chest pain.  Troponin may be elevated with regard to patient's ESRD.  We will cycle cardiac markers. 8. History of bradycardia on pacemaker.  See #1. 9. Diabetes mellitus type 2 on sliding scale coverage.  Since patient was complaining of neck pain after fall I have ordered CT of the C-spine.   DVT prophylaxis: Coumadin. Code Status: Full code. Family Communication: Patient's daughter. Disposition Plan: Home. Consults called: Physical therapy. Admission status: Observation.   Rise Patience MD [facility] Pager [phone].  If 7PM-7AM, please contact night-coverage [email or web address] Password [facility]  05/12/2017, 11:44 PM

## 2017-05-12 NOTE — Progress Notes (Signed)
ANTICOAGULATION CONSULT NOTE - Initial Consult  Pharmacy Consult for Warfarin  Indication: atrial fibrillation  Allergies  Allergen Reactions  . Penicillins Swelling and Rash    Has patient had a PCN reaction causing immediate rash, facial/tongue/throat swelling, SOB or lightheadedness with hypotension: Yes Has patient had a PCN reaction causing severe rash involving mucus membranes or skin necrosis: No Has patient had a PCN reaction that required hospitalization: No Has patient had a PCN reaction occurring within the last 10 years: No If all of the above answers are "NO", then may proceed with Cephalosporin use.   . Codeine Nausea Only  . Tramadol Nausea Only    Vital Signs: Temp: 98.5 F (36.9 C) (02/14 1506) Temp Source: Oral (02/14 1506) BP: 142/69 (02/14 2100) Pulse Rate: 71 (02/14 2100)  Labs: Recent Labs    05/12/17 1516  HGB 11.6*  HCT 36.7*  PLT 187  LABPROT 23.3*  INR 2.09  CREATININE 6.20*    CrCl cannot be calculated (Unknown ideal weight.).   Medical History: Past Medical History:  Diagnosis Date  . Anemia   . Aortic stenosis 06/15/12   TEE - EF 68-34%; grade 1 diastolic dysfunction; mild/mod aortic valve stenosis; Mitral valve had calcified annulus, mild pulm htn PA peak pressure 61mmHg  . Barrett's esophagus 05/2003  . Bradycardia 2017   [employer] 2240 Assurity dual-lead pacemaker  . Carpal tunnel syndrome, bilateral 11/03/2015  . Colon polyps   . CVA (cerebral infarction)    2004/affected left side  . Depression   . Diabetes mellitus without complication ([place])   . Diabetic peripheral neuropathy ([place]) 10/02/2015  . Diverticulosis   . Dyspnea    with exertion  . ESRD (end stage renal disease) on dialysis ()    "[facility] [place]; M[facility]" (05/12/2017)  . GERD (gastroesophageal reflux disease)   . Glaucoma   . History of kidney stones   . Hyperlipidemia   . Hypertension   . Legally blind   . Macular degeneration    both eyes  . Orthostatic  hypotension 09/09/2015  . Paroxysmal atrial fibrillation (HCC)   . Peptic ulcer    bleeding, 1969  . Presence of permanent cardiac pacemaker   . S/P epidural steroid injection    last  injection over 10 years ago  . Seasonal allergies   . Tubular adenoma of colon 07/2001    Assessment: Warfarin PTA for afib, presents to the ED with fainting spells, INR on admit is therapeutic at 2.09, Hgb 11.6, ESRD on HD  Warfarin PTA regimen: 2.5 mg M/W/F, 5 mg all other days  Goal of Therapy:  INR 2-3 Monitor platelets by anticoagulation protocol: Yes   Plan:  Warfarin per home regimen Daily PT/INR Monitor for bleeding   Narda Bonds 05/12/2017,11:59 PM

## 2017-05-13 ENCOUNTER — Observation Stay (HOSPITAL_COMMUNITY): Payer: Medicare Other

## 2017-05-13 ENCOUNTER — Encounter (HOSPITAL_COMMUNITY): Payer: Medicare Other

## 2017-05-13 DIAGNOSIS — N2581 Secondary hyperparathyroidism of renal origin: Secondary | ICD-10-CM | POA: Diagnosis not present

## 2017-05-13 DIAGNOSIS — F329 Major depressive disorder, single episode, unspecified: Secondary | ICD-10-CM | POA: Diagnosis present

## 2017-05-13 DIAGNOSIS — Z87442 Personal history of urinary calculi: Secondary | ICD-10-CM | POA: Diagnosis not present

## 2017-05-13 DIAGNOSIS — Z8249 Family history of ischemic heart disease and other diseases of the circulatory system: Secondary | ICD-10-CM | POA: Diagnosis not present

## 2017-05-13 DIAGNOSIS — Z8673 Personal history of transient ischemic attack (TIA), and cerebral infarction without residual deficits: Secondary | ICD-10-CM | POA: Diagnosis not present

## 2017-05-13 DIAGNOSIS — H401120 Primary open-angle glaucoma, left eye, stage unspecified: Secondary | ICD-10-CM | POA: Diagnosis present

## 2017-05-13 DIAGNOSIS — S199XXA Unspecified injury of neck, initial encounter: Secondary | ICD-10-CM | POA: Diagnosis not present

## 2017-05-13 DIAGNOSIS — Z7952 Long term (current) use of systemic steroids: Secondary | ICD-10-CM | POA: Diagnosis not present

## 2017-05-13 DIAGNOSIS — E1142 Type 2 diabetes mellitus with diabetic polyneuropathy: Secondary | ICD-10-CM | POA: Diagnosis present

## 2017-05-13 DIAGNOSIS — R748 Abnormal levels of other serum enzymes: Secondary | ICD-10-CM

## 2017-05-13 DIAGNOSIS — N186 End stage renal disease: Secondary | ICD-10-CM | POA: Diagnosis not present

## 2017-05-13 DIAGNOSIS — I951 Orthostatic hypotension: Secondary | ICD-10-CM | POA: Diagnosis not present

## 2017-05-13 DIAGNOSIS — Z7901 Long term (current) use of anticoagulants: Secondary | ICD-10-CM | POA: Diagnosis not present

## 2017-05-13 DIAGNOSIS — Z992 Dependence on renal dialysis: Secondary | ICD-10-CM

## 2017-05-13 DIAGNOSIS — R55 Syncope and collapse: Secondary | ICD-10-CM | POA: Diagnosis not present

## 2017-05-13 DIAGNOSIS — I480 Paroxysmal atrial fibrillation: Secondary | ICD-10-CM | POA: Diagnosis present

## 2017-05-13 DIAGNOSIS — I481 Persistent atrial fibrillation: Secondary | ICD-10-CM | POA: Diagnosis not present

## 2017-05-13 DIAGNOSIS — Z952 Presence of prosthetic heart valve: Secondary | ICD-10-CM | POA: Diagnosis not present

## 2017-05-13 DIAGNOSIS — Z800 Family history of malignant neoplasm of digestive organs: Secondary | ICD-10-CM | POA: Diagnosis not present

## 2017-05-13 DIAGNOSIS — E785 Hyperlipidemia, unspecified: Secondary | ICD-10-CM | POA: Diagnosis present

## 2017-05-13 DIAGNOSIS — D631 Anemia in chronic kidney disease: Secondary | ICD-10-CM | POA: Diagnosis not present

## 2017-05-13 DIAGNOSIS — I361 Nonrheumatic tricuspid (valve) insufficiency: Secondary | ICD-10-CM | POA: Diagnosis not present

## 2017-05-13 DIAGNOSIS — Z86010 Personal history of colonic polyps: Secondary | ICD-10-CM | POA: Diagnosis not present

## 2017-05-13 DIAGNOSIS — E1122 Type 2 diabetes mellitus with diabetic chronic kidney disease: Secondary | ICD-10-CM | POA: Diagnosis present

## 2017-05-13 DIAGNOSIS — K219 Gastro-esophageal reflux disease without esophagitis: Secondary | ICD-10-CM | POA: Diagnosis present

## 2017-05-13 DIAGNOSIS — H548 Legal blindness, as defined in USA: Secondary | ICD-10-CM | POA: Diagnosis present

## 2017-05-13 DIAGNOSIS — Z947 Corneal transplant status: Secondary | ICD-10-CM | POA: Diagnosis not present

## 2017-05-13 DIAGNOSIS — I120 Hypertensive chronic kidney disease with stage 5 chronic kidney disease or end stage renal disease: Secondary | ICD-10-CM | POA: Diagnosis present

## 2017-05-13 DIAGNOSIS — H3530 Unspecified macular degeneration: Secondary | ICD-10-CM | POA: Diagnosis present

## 2017-05-13 DIAGNOSIS — K5790 Diverticulosis of intestine, part unspecified, without perforation or abscess without bleeding: Secondary | ICD-10-CM | POA: Diagnosis present

## 2017-05-13 LAB — BASIC METABOLIC PANEL
ANION GAP: 19 — AB (ref 5–15)
BUN: 33 mg/dL — ABNORMAL HIGH (ref 6–20)
CALCIUM: 9.7 mg/dL (ref 8.9–10.3)
CO2: 26 mmol/L (ref 22–32)
Chloride: 96 mmol/L — ABNORMAL LOW (ref 101–111)
Creatinine, Ser: 7.58 mg/dL — ABNORMAL HIGH (ref 0.61–1.24)
GFR calc Af Amer: 7 mL/min — ABNORMAL LOW (ref >60)
GFR, EST NON AFRICAN AMERICAN: 6 mL/min — AB (ref >60)
Glucose, Bld: 113 mg/dL — ABNORMAL HIGH (ref 65–99)
Potassium: 3.9 mmol/L (ref 3.5–5.1)
SODIUM: 141 mmol/L (ref 135–145)

## 2017-05-13 LAB — CBC
HCT: 36.6 % — ABNORMAL LOW (ref 39.0–52.0)
HEMOGLOBIN: 11.5 g/dL — AB (ref 13.0–17.0)
MCH: 32 pg (ref 26.0–34.0)
MCHC: 31.4 g/dL (ref 30.0–36.0)
MCV: 101.9 fL — ABNORMAL HIGH (ref 78.0–100.0)
Platelets: 150 10*3/uL (ref 150–400)
RBC: 3.59 MIL/uL — AB (ref 4.22–5.81)
RDW: 20.2 % — ABNORMAL HIGH (ref 11.5–15.5)
WBC: 9.3 10*3/uL (ref 4.0–10.5)

## 2017-05-13 LAB — TROPONIN I
TROPONIN I: 0.08 ng/mL — AB (ref <0.03)
TROPONIN I: 0.09 ng/mL — AB (ref <0.03)
TROPONIN I: 0.09 ng/mL — AB (ref <0.03)

## 2017-05-13 LAB — PROTIME-INR
INR: 2.1
Prothrombin Time: 23.4 seconds — ABNORMAL HIGH (ref 11.4–15.2)

## 2017-05-13 LAB — MAGNESIUM: MAGNESIUM: 1.8 mg/dL (ref 1.7–2.4)

## 2017-05-13 LAB — DIGOXIN LEVEL: Digoxin Level: 0.8 ng/mL (ref 0.8–2.0)

## 2017-05-13 LAB — GLUCOSE, CAPILLARY
GLUCOSE-CAPILLARY: 99 mg/dL (ref 65–99)
GLUCOSE-CAPILLARY: 80 mg/dL (ref 65–99)
Glucose-Capillary: 94 mg/dL (ref 65–99)

## 2017-05-13 LAB — MRSA PCR SCREENING: MRSA by PCR: NEGATIVE

## 2017-05-13 LAB — TSH: TSH: 1.526 u[IU]/mL (ref 0.350–4.500)

## 2017-05-13 MED ORDER — DOXERCALCIFEROL 4 MCG/2ML IV SOLN
1.0000 ug | INTRAVENOUS | Status: DC
  Filled 2017-05-13: qty 2

## 2017-05-13 NOTE — Care Management Obs Status (Signed)
[facility] NOTIFICATION   Patient Details  Name: Alexander [name] MRN: [id number] Date of Birth: Dec 22, 1940   Medicare Observation Status Notification Given:   Yes  Kristen Cardinal, RN 05/13/2017, 2:34 PM

## 2017-05-13 NOTE — Consult Note (Signed)
Cardiology Consultation:   Patient ID: Alexander [name]; [id number]; 02-11-1941   Admit date: 05/12/2017 Date of Consult: 05/13/2017  Primary Care Provider: Deland Pretty, MD Primary Cardiologist: Skeet Latch, MD  Primary Electrophysiologist:  Dr. Curt Bears   Patient Profile:   Alexander [name] is a 77 y.o. male with a hx of AS s/p TAVR (08/2016), ESRD on HD, hypotension on midodrine, DM, HLD, carotid artery disease with complete occlusion of the left carotid, prior TIA, paroxysmal Afib, and PPM implanted 01/2015 for symptomatic bradycardia who is being seen today for the evaluation of syncope and shortness of breath at the request of Dr. Verlon Au.  History of Present Illness:   Alexander [name] is known to this service and last saw [name] 04/12/17. At that time, she stopped dilitazem and started digoxin with continuation of lopressor with the goal of creating more pressure on HD days. Recent echo 10/07/16 with normal EF and functioning prosthetic valve. He has struggled with volume control with HD. He was recently hospitalized 11/2016 with sepsis secondary to strep bacteremia with unclear source. TEE 11/2016 negative for endocarditis. He routinely takes midodrine at HD for hypotension.  He presented to [facility] after two episodes of pre-syncope vs syncope. Yesterday at approximately 1330, he got up from a kitchen chair to get in the car for a dentist appt. He started to feel lightheaded and dizzy. He states his legs gave out on him and he fell to the floor.  The patient states he did not lose consciousness.  He was helped up out of the floor and proceeded to get into the car for the dentist appointment.  He does report taking Midodrine after this episode because he felt as though his pressure was low.  When they arrived, he exited the car and went inside the dental office where he fell again.  The patient states that he again was dizzy and lightheaded and fell to the floor.  The patient states that during  this episode he did lose consciousness.  EMS was dispatched and brought the patient to [facility] ED.  He hit the back of his head during his second fall.   He denies shortness of breath, palpitations, problems with bleeding, and swelling.  On arrival, head CT was negative for intracranial bleed.  Patient also complained of onset of sore throat.  Neck imaging showed occluded left ICA, no acute injuries.  PPM was interrogated and was without significant events.  Troponins have been trended and are mildly elevated but flat in the setting of ESRD.  EKG without acute signs of ischemia.   Past Medical History:  Diagnosis Date  . Anemia   . Aortic stenosis 06/15/12   TEE - EF 15-17%; grade 1 diastolic dysfunction; mild/mod aortic valve stenosis; Mitral valve had calcified annulus, mild pulm htn PA peak pressure 39mmHg  . Barrett's esophagus 05/2003  . Bradycardia 2017   [employer] 2240 Assurity dual-lead pacemaker  . Carpal tunnel syndrome, bilateral 11/03/2015  . Colon polyps   . CVA (cerebral infarction)    2004/affected left side  . Depression   . Diabetes mellitus without complication ([place])   . Diabetic peripheral neuropathy ([place]) 10/02/2015  . Diverticulosis   . Dyspnea    with exertion  . ESRD (end stage renal disease) on dialysis ([place])    "[facility]; [place]; MWF" (05/12/2017)  . GERD (gastroesophageal reflux disease)   . Glaucoma   . History of kidney stones   . Hyperlipidemia   . Hypertension   .  Legally blind   . Macular degeneration    both eyes  . Orthostatic hypotension 09/09/2015  . Paroxysmal atrial fibrillation (HCC)   . Peptic ulcer    bleeding, 1969  . Presence of permanent cardiac pacemaker   . S/P epidural steroid injection    last  injection over 10 years ago  . Seasonal allergies   . Tubular adenoma of colon 07/2001    Past Surgical History:  Procedure Laterality Date  . AV FISTULA PLACEMENT  2009  . BACK SURGERY    . CARDIOVERSION N/A 11/13/2015   Procedure:  CARDIOVERSION;  Surgeon: Troy Sine, MD;  Location: [facility] ENDOSCOPY;  Service: Cardiovascular;  Laterality: N/A;  . CARDIOVERSION N/A 01/13/2016   Procedure: CARDIOVERSION;  Surgeon: Will Meredith [name], MD;  Location: [place];  Service: Cardiovascular;  Laterality: N/A;  . [place]   right eye  . CYSTOSCOPY  several times   kidney stones  . EP IMPLANTABLE DEVICE N/A 03/11/2015   Procedure: Pacemaker Implant;  Surgeon: Will Meredith [name], MD;  [place];  Laterality: Left  . LAMINECTOMY  1969  . RIGHT/LEFT HEART CATH AND CORONARY ANGIOGRAPHY N/A 08/20/2016   Procedure: Right/Left Heart Cath and Coronary Angiography;  Surgeon: Burnell Blanks, MD;  Location: [place] CV LAB;  Service: Cardiovascular;  Laterality: N/A;  . TEE WITHOUT CARDIOVERSION N/A 07/22/2016   Procedure: TRANSESOPHAGEAL ECHOCARDIOGRAM (TEE);  Surgeon: Skeet Latch, MD;  Location: [place];  Service: Cardiovascular;  Laterality: N/A;  . TEE WITHOUT CARDIOVERSION N/A 08/31/2016   Procedure: TRANSESOPHAGEAL ECHOCARDIOGRAM (TEE);  Surgeon: Burnell Blanks, MD;  Location: [place];  Service: Open Heart Surgery;  Laterality: N/A;  . TEE WITHOUT CARDIOVERSION N/A 12/01/2016   Procedure: TRANSESOPHAGEAL ECHOCARDIOGRAM (TEE);  Surgeon: Larey Dresser, MD;  Location: [facility] ENDOSCOPY;  Service: Cardiovascular;  Laterality: N/A;  . TONSILLECTOMY  1964  . TRANSCATHETER AORTIC VALVE REPLACEMENT, TRANSFEMORAL N/A 08/31/2016   Procedure: TRANSCATHETER AORTIC VALVE REPLACEMENT, TRANSFEMORAL;  Surgeon: Burnell Blanks, MD;  Location: [place];  Service: Open Heart Surgery;  Laterality: N/A;     Home Medications:  Prior to Admission medications   Medication Sig Start Date End Date Taking? Authorizing Provider  acetaminophen (TYLENOL) 325 MG tablet Take 2 tablets (650 mg total) by mouth every 6 (six) hours as needed for mild pain. Patient taking differently: Take 650 mg by  mouth every 6 (six) hours.  09/02/16  Yes Lars Pinks M, PA-C  b complex-vitamin c-folic acid (NEPHRO-VITE) 0.8 MG TABS Take 1 tablet by mouth daily.    Yes [provider]  bromocriptine (PARLODEL) 5 MG capsule Take 5 mg by mouth at bedtime.  11/30/10  Yes [provider]  calcium acetate (PHOSLO) 667 MG capsule Take 2,668 mg by mouth 4 (four) times daily.    Yes [provider]  cetirizine (ZYRTEC) 10 MG tablet Take 10 mg by mouth at bedtime.    Yes [provider]  cinacalcet (SENSIPAR) 30 MG tablet Take 30 mg by mouth every evening.    Yes [provider]  clindamycin (CLEOCIN) 150 MG capsule Take 2,000 mg by mouth See admin instructions. Take 2,000 mg by mouth one hour prior to dental procedure 03/31/17  Yes [provider]  digoxin (LANOXIN) 0.125 MG tablet 1/2 TABLET BY MOUTH DAILY Patient taking differently: Take 0.0625 mg by mouth daily.  04/29/17  Yes Skeet Latch, MD  dorzolamide-timolol (COSOPT) 22.3-6.8 MG/ML ophthalmic solution Place 1 drop into  both eyes 2 (two) times daily.  08/13/13  Yes [provider]  methocarbamol (ROBAXIN) 500 MG tablet Take 1 tablet (500 mg total) by mouth every 6 (six) hours as needed for muscle spasms. 12/02/16  Yes Florencia Reasons, MD  metoprolol tartrate (LOPRESSOR) 50 MG tablet Take 1 tablet (50 mg total) by mouth 2 (two) times daily. 12/02/16  Yes Florencia Reasons, MD  midodrine (PROAMATINE) 10 MG tablet Take 10 mg by mouth See admin instructions. 10 mg by mouth prior to dialysis and an extra 5 mg as needed for low b/p prior to dialysis (may also take 15 mg as needed for low b/p during dialysis)   Yes [provider]  midodrine (PROAMATINE) 5 MG tablet Take 5-15 mg by mouth daily as needed (for a sudden drop in blood pressure).    Yes [provider]  Nutritional Supplements (NEPRO) LIQD Take 237 mLs by mouth every Monday, Wednesday, and Friday with hemodialysis.    Yes [provider]  omeprazole (PRILOSEC) 20 MG capsule Take 20 mg by mouth daily.   Yes [provider]  polyethylene glycol (MIRALAX / GLYCOLAX) packet Take 17 g by mouth daily. Patient taking differently: Take 17 g by mouth daily as needed for mild constipation.  12/02/16  Yes Florencia Reasons, MD  predniSONE (DELTASONE) 5 MG tablet Take 5 mg by mouth at bedtime.  04/27/16  Yes [provider]  senna-docusate (SENOKOT-S) 8.6-50 MG tablet Take 1 tablet by mouth at bedtime. Patient taking differently: Take 1 tablet by mouth at bedtime as needed for mild constipation.  12/02/16  Yes Florencia Reasons, MD  sertraline (ZOLOFT) 50 MG tablet Take 50 mg by mouth daily. 11/18/16  Yes [provider]  Tiotropium Bromide-Olodaterol (STIOLTO RESPIMAT) 2.5-2.5 MCG/ACT AERS Inhale 2 puffs into the lungs daily. Patient taking differently: Inhale 2 puffs into the lungs 2 (two) times daily.  01/27/17  Yes Mannam, Praveen, MD  warfarin (COUMADIN) 5 MG tablet Take 1-1.5 tablets by mouth daily as directed by coumadin clinic Patient taking differently: Take 2.5-5 mg by mouth See admin instructions. 5 mg by mouth at night on Sun/Tues/Thurs/Sat and 2.5 mg on Mon/Wed/Fri 03/08/16  Yes Skeet Latch, MD  atorvastatin (LIPITOR) 40 MG tablet Take 20 mg by mouth daily.    [provider]    Inpatient Medications: Scheduled Meds: . bromocriptine  5 mg Oral QHS  . calcium acetate  2,668 mg Oral TID WC & HS  . cinacalcet  30 mg Oral QPM  . digoxin  0.0625 mg Oral Daily  . dorzolamide-timolol  1 drop Both Eyes BID  . doxercalciferol  1 mcg Intravenous Q M,W,F-HD  . feeding supplement (NEPRO CARB STEADY)  237 mL Oral Q M,W,F  . insulin aspart  0-9 Units Subcutaneous TID WC  . loratadine  10 mg Oral Daily  . metoprolol tartrate  50 mg Oral BID  . midodrine  10 mg Oral Q M,W,F-HD  . multivitamin  1 tablet Oral QHS  . pantoprazole  40 mg Oral Daily  . polyethylene glycol  17 g Oral Daily  . predniSONE  5 mg Oral QHS    . sertraline  50 mg Oral Daily  . warfarin  2.5 mg Oral Q M,W,F-1800  . warfarin  5 mg Oral Q T,Th,S,Su-1800  . Warfarin - Pharmacist Dosing Inpatient   Does not apply q1800   Continuous Infusions:  PRN Meds: acetaminophen **OR** acetaminophen, midodrine, ondansetron **OR** ondansetron (ZOFRAN) IV, senna-docusate  Allergies:  Allergies  Allergen Reactions  . Penicillins Swelling and Rash    Has patient had a PCN reaction causing immediate rash, facial/tongue/throat swelling, SOB or lightheadedness with hypotension: Yes Has patient had a PCN reaction causing severe rash involving mucus membranes or skin necrosis: No Has patient had a PCN reaction that required hospitalization: No Has patient had a PCN reaction occurring within the last 10 years: No If all of the above answers are "NO", then may proceed with Cephalosporin use.   . Codeine Nausea Only  . Tramadol Nausea Only    Social History:   Social History   Socioeconomic History  . Marital status: Married    Spouse name: Not on file  . Number of children: 3  . Years of education: 25  . Highest education level: Not on file  Social Needs  . Financial resource strain: Not on file  . Food insecurity - worry: Not on file  . Food insecurity - inability: Not on file  . Transportation needs - medical: Not on file  . Transportation needs - non-medical: Not on file  Occupational History  . 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 Smoking status: Former Smoker    Packs/day: 2.00    Years: 45.00    Pack years: 90.00    Types: Cigarettes    Last attempt to quit: 01/20/1998    Years since quitting: 19.3  . Smokeless tobacco: Never Used  Substance and Sexual Activity  . Alcohol use: No    Alcohol/week: 0.0 oz  . Drug use: No  . Sexual activity: Yes  Other Topics Concern  . Not on file  Social History Narrative   Lives at home w/ his wife   Right-handed   Drinks 1 cup of coffee per day     Family History:    Family History  Problem Relation Age of Onset  . Stomach cancer Mother   . Hypertension Father        Died of heart attack  . Heart attack Father   . Stroke Sister   . Heart disease Sister   . Cancer Brother   . Colon cancer Neg Hx      ROS:  Please see the history of present illness.   All other ROS reviewed and negative.     Physical Exam/Data:   Vitals:   05/12/17 2030 05/12/17 2100 05/13/17 0558 05/13/17 0844  BP: 138/69 (!) 142/69 98/62 133/81  Pulse: 69 71 69 70  Resp: 18 15 17 16   Temp:   97.9 F (36.6 C) 97.9 F (36.6 C)  TempSrc:   Oral Oral  SpO2: 97% 94% 95% 97%  Weight:   195 lb 8.8 oz (88.7 kg)     Intake/Output Summary (Last 24 hours) at 05/13/2017 1420 Last data filed at 05/13/2017 0600 Gross per 24 hour  Intake 0 ml  Output 0 ml  Net 0 ml   Filed Weights   05/13/17 0558  Weight: 195 lb 8.8 oz (88.7 kg)   Body mass index is 27.66 kg/m.  General:  Well nourished, well developed, in no acute distress HEENT: normal Neck: no JVD Vascular: No carotid bruits  Cardiac:  Irregular rhythm, regular rate, + murmur Lungs:  clear to auscultation bilaterally, no wheezing, rhonchi or rales  Abd: soft, nontender, no hepatomegaly  Ext: no edema Musculoskeletal:  No deformities, BUE and BLE strength normal and equal, AV fistula in left arm Skin: warm and dry  Neuro:  CNs 2-12 intact, no focal abnormalities noted Psych:  Normal affect   EKG:  The EKG was personally reviewed and demonstrates:  Afib, PVC Telemetry:  Telemetry was personally reviewed and demonstrates:  Afib, rate controlled  Relevant CV Studies:  Echo 12/01/16 Study Conclusions - Left ventricle: The cavity size was normal. Wall thickness was normal. The estimated ejection fraction was 50%. Mild septal hypokinesis. - Aortic valve: The patient is s/p TAVR with bioprosthetic aortic valve. The valve opens well, difficult transgastric images so did   not get a mean  gradient. There was trivial peri-valvular   regurgitation. No vegetation noted on aortic valve. - Aorta: Grade III plaque in descending thoracic aorta, normal caliber thoracic aorta . - Mitral valve: Moderately calcified annulus. Moderately calcified leaflets . There was moderate MR with ERO 0.33 cm^2 by PISA. Mean   gradient 7 mmHg across MV but short pressure half-time, doubt   significant stenosis (elevated gradient may be due to high flow   from MR). No MV vegetation. - Left atrium: Severe left atrial enlargement, smoke but no   thrombus in LA appendage. - Right ventricle: The RV was mildly dilated with mildly decreased   systolic function. Pacemaker on right side of heart, no   vegetation noted on leads. - Right atrium: The atrium was moderately dilated. - Atrial septum: Lipomatous interatrial septal hypertrophy. No   ASD/PFO by color doppler. - Tricuspid valve: There was moderate regurgitation. Peak RV-RA   gradient 27 mmHg. - Pulmonic valve: No evidence of vegetation.  Impressions: - No evidence of endocarditis.  Laboratory Data:  Chemistry Recent Labs  Lab 05/12/17 1516 05/13/17 0611  NA 138 141  K 3.5 3.9  CL 95* 96*  CO2 29 26  GLUCOSE 80 113*  BUN 25* 33*  CREATININE 6.20* 7.58*  CALCIUM 9.7 9.7  GFRNONAA 8* 6*  GFRAA 9* 7*  ANIONGAP 14 19*    No results for input(s): PROT, ALBUMIN, AST, ALT, ALKPHOS, BILITOT in the last 168 hours. Hematology Recent Labs  Lab 05/12/17 1516 05/13/17 0611  WBC 10.9* 9.3  RBC 3.63* 3.59*  HGB 11.6* 11.5*  HCT 36.7* 36.6*  MCV 101.1* 101.9*  MCH 32.0 32.0  MCHC 31.6 31.4  RDW 20.2* 20.2*  PLT 187 150   Cardiac Enzymes Recent Labs  Lab 05/13/17 0051 05/13/17 0611 05/13/17 1221  TROPONINI 0.09* 0.09* 0.08*    Recent Labs  Lab 05/12/17 1529 05/12/17 1755  TROPIPOC 0.09* 0.09*    BNPNo results for input(s): BNP, PROBNP in the last 168 hours.  DDimer No results for input(s): DDIMER in the last 168  hours.  Radiology/Studies:  Dg Chest 2 View  Result Date: 05/12/2017 CLINICAL DATA:  Syncopal episode EXAM: CHEST  2 VIEW COMPARISON:  04/28/2017 FINDINGS: Cardiac shadow is enlarged. Pacing device is again seen. Changes consistent with prior TAVR are noted and stable. The lungs are well aerated bilaterally. No focal infiltrate or sizable effusion is seen. No bony abnormality is noted. IMPRESSION: No acute abnormality noted. Electronically Signed   By: Inez Catalina M.D.   On: 05/12/2017 15:57   Ct Head Wo Contrast  Result Date: 05/12/2017 CLINICAL DATA:  Head injury after fall. Possible loss of consciousness. EXAM: CT HEAD WITHOUT CONTRAST TECHNIQUE: Contiguous axial images were obtained from the base of the skull through the vertex without intravenous contrast. COMPARISON:  CT scan of November 26, 2016. FINDINGS: Brain: Left occipital encephalomalacia is noted consistent with old infarction. Mild diffuse cortical atrophy is noted.  Mild chronic ischemic white matter disease is noted. No mass effect or midline shift is noted. Ventricular size is within normal limits. There is no evidence of mass lesion, hemorrhage or acute infarction. Vascular: No hyperdense vessel or unexpected calcification. Skull: Normal. Negative for fracture or focal lesion. Sinuses/Orbits: No acute finding. Other: None. IMPRESSION: Old left occipital infarction. Mild diffuse cortical atrophy. Mild chronic ischemic white matter disease. No acute intracranial abnormality seen. Electronically Signed   By: Marijo Conception, M.D.   On: 05/12/2017 16:18   Ct Cervical Spine Wo Contrast  Result Date: 05/13/2017 CLINICAL DATA:  Two falls in 2 days. EXAM: CT CERVICAL SPINE WITHOUT CONTRAST TECHNIQUE: Multidetector CT imaging of the cervical spine was performed without intravenous contrast. Multiplanar CT image reconstructions were also generated. COMPARISON:  CT cervical spine November 25, 2016 FINDINGS: ALIGNMENT: Straightened cervical lordosis.  Stable grade 1 (4 mm) C3-4 anterolisthesis. SKULL BASE AND VERTEBRAE: Cervical vertebral bodies and posterior elements are intact. Stable severe C4-5 through C6-7 disc height loss with endplate sclerosis and marginal spurring compatible with degenerative discs. C1-2 articulation maintained with severe arthropathy. Small amount of calcified pannus about the odontoid process seen with CPPD. Calcified longus coli insertion associated with calcific tendinopathy. Severe LEFT upper cervical facet arthropathy. Probable bone island RIGHT C7 facet. SOFT TISSUES AND SPINAL CANAL: Included view of the brain demonstrates old large LEFT PCA territory infarct. Severe calcific atherosclerosis carotid bifurcations. DISC LEVELS: Mild canal stenosis C5-6. Severe LEFT C3-4, moderate to severe bilateral C4-5, severe bilateral C5-6 and C6-7 neural foraminal narrowing. UPPER CHEST: Lung apices are clear.  RIGHT cardiac pacemaker. OTHER: None. IMPRESSION: 1. No acute fracture. Stable grade 1 C3-4 anterolisthesis on degenerative basis. 2. Mild canal stenosis C5-6. Severe neural foraminal narrowing C3-4, C5-6 and C6-7. 3. Severe ICA atherosclerosis with suspected hemodynamically significant stenosis which could be quantified with CTA NECK on nonemergent basis. Electronically Signed   By: Elon Alas M.D.   On: 05/13/2017 00:48    Assessment and Plan:   1. Pre-syncope, syncope, elevated troponin, PPM Device interrogated, no events appreciated.  Will repeat echo to assess aortic valve.  Elevated but flat troponin in ESRD: 0.09 --> 0.09 --> 0.08. Have low suspicion for ACS, especially since he is not reporting anginal symptoms. EKG with Afib and LBBB (not new).  Orthostatic vitals negative today.    2. AS s/p TAVR Last echo with normal functioning prosthetic valve. Will repeat echo to assess valvular changes.   3. Bradycardia, persistent atrial fibrillation On chronic coumadin. INR today 2.10.  Diltiazem D/C'ed in Jan 2019,  digoxin started and lopressor continued. Digoxin level 0.8. PPM interrogation recorded last RVR event on 04/19/17. Will continue lopressor and digoxin.   4. Hypotension with HD Pt has been taking midodrine on HD days. He also took midodrine following his first pre-syncopal/syncopal episode. Orthostatic vitals negative for hypotension   5. Carotid stenosis, left chronically occluded, right with mild stenosis Will repeat carotid ultrasound.   6. HLD Atorvastatin paused to assess myalgia pain. Pt states he is having less back and leg pain. Will not restart this, may try crestor.   7. Sore throat Strep swab in process. Will also order respiratory panel, including influenza screening.    For questions or updates, please contact [place] Please consult [email or web address] for contact info under Cardiology/STEMI.   Signed, Tami Lin Zaeem Kandel, PA  05/13/2017 2:20 PM

## 2017-05-13 NOTE — Progress Notes (Signed)
Device interrogated with [employer]  Normal device function without evidence of high rates This makes hypotension, esp w hx, the most likely.

## 2017-05-13 NOTE — Progress Notes (Signed)
Dialysis treatment completed.  1600 mL ultrafiltrated and net fluid removal 1100 mL.    Patient status unchanged. Lung sounds clear to ausculation in all fields. No edema. Cardiac: NSR.  Disconnected lines and removed needles.  Pressure held for 10 minutes and band aid/gauze dressing applied.  Report given to bedside RN, Malachy Mood.

## 2017-05-13 NOTE — Progress Notes (Signed)
Referring Provider: No ref. provider found Primary Care Physician:  Deland Pretty, MD Primary Nephrologist:    Reason for Consultation: Medical management of End stage renal disease  Anemia and secondary hyperparathyroidism   HPI: Alexander [name] is a 77 Y/O caucasian male with ESRD on hemodialysis MWF at [facility]. PMH significant for DM, HTN, H/O Afib on coumadin, mild dementia, macular degeneration, moderate AS, OSA, H/O pituitary adenoma, permanent pacemaker placement.   He has been admitted as observation patient for syncope/mechanical fall at dentist office. CT of head without acute abnormalities, Troponin 0.09, EKG shows AFib, LBBB. We will order and supervise dialysis today. Please notify us if patient status upgraded to inpatient and we will consult formally.    Past Medical History:  Diagnosis Date  . Anemia   . Aortic stenosis 06/15/12   TEE - EF 42-59%; grade 1 diastolic dysfunction; mild/mod aortic valve stenosis; Mitral valve had calcified annulus, mild pulm htn PA peak pressure 10mmHg  . Barrett's esophagus 05/2003  . Bradycardia 2017   [employer] 2240 Assurity dual-lead pacemaker  . Carpal tunnel syndrome, bilateral 11/03/2015  . Colon polyps   . CVA (cerebral infarction)    2004/affected left side  . Depression   . Diabetes mellitus without complication ([place])   . Diabetic peripheral neuropathy ([place]) 10/02/2015  . Diverticulosis   . Dyspnea    with exertion  . ESRD (end stage renal disease) on dialysis ([place])    "[facility]; [place]; MWF" (05/12/2017)  . GERD (gastroesophageal reflux disease)   . Glaucoma   . History of kidney stones   . Hyperlipidemia   . Hypertension   . Legally blind   . Macular degeneration    both eyes  . Orthostatic hypotension 09/09/2015  . Paroxysmal atrial fibrillation (HCC)   . Peptic ulcer    bleeding, 1969  . Presence of permanent cardiac pacemaker   . S/P epidural steroid injection    last  injection over 10  years ago  . Seasonal allergies   . Tubular adenoma of colon 07/2001    Past Surgical History:  Procedure Laterality Date  . AV FISTULA PLACEMENT  2009  . BACK SURGERY    . CARDIOVERSION N/A 11/13/2015   Procedure: CARDIOVERSION;  Surgeon: Troy Sine, MD;  Location: [facility] ENDOSCOPY;  Service: Cardiovascular;  Laterality: N/A;  . CARDIOVERSION N/A 01/13/2016   Procedure: CARDIOVERSION;  Surgeon: Will Meredith [name], MD;  Location: [place];  Service: Cardiovascular;  Laterality: N/A;  . [place]   right eye  . CYSTOSCOPY  several times   kidney stones  . EP IMPLANTABLE DEVICE N/A 03/11/2015   Procedure: Pacemaker Implant;  Surgeon: Will Meredith [name], MD;  [place];  Laterality: Left  . LAMINECTOMY  1969  . RIGHT/LEFT HEART CATH AND CORONARY ANGIOGRAPHY N/A 08/20/2016   Procedure: Right/Left Heart Cath and Coronary Angiography;  Surgeon: Burnell Blanks, MD;  Location: [place] CV LAB;  Service: Cardiovascular;  Laterality: N/A;  . TEE WITHOUT CARDIOVERSION N/A 07/22/2016   Procedure: TRANSESOPHAGEAL ECHOCARDIOGRAM (TEE);  Surgeon: Skeet Latch, MD;  Location: [place];  Service: Cardiovascular;  Laterality: N/A;  . TEE WITHOUT CARDIOVERSION N/A 08/31/2016   Procedure: TRANSESOPHAGEAL ECHOCARDIOGRAM (TEE);  Surgeon: Burnell Blanks, MD;  Location: [place];  Service: Open Heart Surgery;  Laterality: N/A;  . TEE WITHOUT CARDIOVERSION N/A 12/01/2016   Procedure: TRANSESOPHAGEAL ECHOCARDIOGRAM (TEE);  Surgeon: Larey Dresser, MD;  Location: [facility] ENDOSCOPY;  Service: Cardiovascular;  Laterality: N/A;  . TONSILLECTOMY  1964  . TRANSCATHETER AORTIC VALVE REPLACEMENT, TRANSFEMORAL N/A 08/31/2016   Procedure: TRANSCATHETER AORTIC VALVE REPLACEMENT, TRANSFEMORAL;  Surgeon: Burnell Blanks, MD;  Location: [place];  Service: Open Heart Surgery;  Laterality: N/A;    Prior to Admission medications   Medication Sig Start Date End Date  Taking? Authorizing Provider  acetaminophen (TYLENOL) 325 MG tablet Take 2 tablets (650 mg total) by mouth every 6 (six) hours as needed for mild pain. Patient taking differently: Take 650 mg by mouth every 6 (six) hours.  09/02/16  Yes Lars Pinks M, PA-C  b complex-vitamin c-folic acid (NEPHRO-VITE) 0.8 MG TABS Take 1 tablet by mouth daily.    Yes [provider]  bromocriptine (PARLODEL) 5 MG capsule Take 5 mg by mouth at bedtime.  11/30/10  Yes [provider]  calcium acetate (PHOSLO) 667 MG capsule Take 2,668 mg by mouth 4 (four) times daily.    Yes [provider]  cetirizine (ZYRTEC) 10 MG tablet Take 10 mg by mouth at bedtime.    Yes [provider]  cinacalcet (SENSIPAR) 30 MG tablet Take 30 mg by mouth every evening.    Yes [provider]  clindamycin (CLEOCIN) 150 MG capsule Take 2,000 mg by mouth See admin instructions. Take 2,000 mg by mouth one hour prior to dental procedure 03/31/17  Yes [provider]  digoxin (LANOXIN) 0.125 MG tablet 1/2 TABLET BY MOUTH DAILY Patient taking differently: Take 0.0625 mg by mouth daily.  04/29/17  Yes Skeet Latch, MD  dorzolamide-timolol (COSOPT) 22.3-6.8 MG/ML ophthalmic solution Place 1 drop into both eyes 2 (two) times daily.  08/13/13  Yes [provider]  methocarbamol (ROBAXIN) 500 MG tablet Take 1 tablet (500 mg total) by mouth every 6 (six) hours as needed for muscle spasms. 12/02/16  Yes Florencia Reasons, MD  metoprolol tartrate (LOPRESSOR) 50 MG tablet Take 1 tablet (50 mg total) by mouth 2 (two) times daily. 12/02/16  Yes Florencia Reasons, MD  midodrine (PROAMATINE) 10 MG tablet Take 10 mg by mouth See admin instructions. 10 mg by mouth prior to dialysis and an extra 5 mg as needed for low b/p prior to dialysis (may also take 15 mg as needed for low b/p during dialysis)   Yes [provider]  midodrine (PROAMATINE) 5 MG tablet Take 5-15 mg by mouth daily as needed (for a sudden drop  in blood pressure).    Yes [provider]  Nutritional Supplements (NEPRO) LIQD Take 237 mLs by mouth every Monday, Wednesday, and Friday with hemodialysis.    Yes [provider]  omeprazole (PRILOSEC) 20 MG capsule Take 20 mg by mouth daily.   Yes [provider]  polyethylene glycol (MIRALAX / GLYCOLAX) packet Take 17 g by mouth daily. Patient taking differently: Take 17 g by mouth daily as needed for mild constipation.  12/02/16  Yes Florencia Reasons, MD  predniSONE (DELTASONE) 5 MG tablet Take 5 mg by mouth at bedtime.  04/27/16  Yes [provider]  senna-docusate (SENOKOT-S) 8.6-50 MG tablet Take 1 tablet by mouth at bedtime. Patient taking differently: Take 1 tablet by mouth at bedtime as needed for mild constipation.  12/02/16  Yes Florencia Reasons, MD  sertraline (ZOLOFT) 50 MG tablet Take 50 mg by mouth daily. 11/18/16  Yes [provider]  Tiotropium Bromide-Olodaterol (STIOLTO RESPIMAT) 2.5-2.5 MCG/ACT AERS Inhale 2 puffs into the lungs daily. Patient taking differently: Inhale 2 puffs into  the lungs 2 (two) times daily.  01/27/17  Yes Mannam, Praveen, MD  warfarin (COUMADIN) 5 MG tablet Take 1-1.5 tablets by mouth daily as directed by coumadin clinic Patient taking differently: Take 2.5-5 mg by mouth See admin instructions. 5 mg by mouth at night on Sun/Tues/Thurs/Sat and 2.5 mg on Mon/Wed/Fri 03/08/16  Yes Skeet Latch, MD  atorvastatin (LIPITOR) 40 MG tablet Take 20 mg by mouth daily.    [provider]    Current Facility-Administered Medications  Medication Dose Route Frequency Provider Last Rate Last Dose  . acetaminophen (TYLENOL) tablet 650 mg  650 mg Oral Q6H PRN Rise Patience, MD       Or  . acetaminophen (TYLENOL) suppository 650 mg  650 mg Rectal Q6H PRN Rise Patience, MD      . bromocriptine (PARLODEL) tablet 5 mg  5 mg Oral QHS Rise Patience, MD   5 mg at 05/13/17 0242  . calcium acetate (PHOSLO) capsule 2,668  mg  2,668 mg Oral TID WC & HS Rise Patience, MD   2,668 mg at 05/13/17 0843  . cinacalcet (SENSIPAR) tablet 30 mg  30 mg Oral QPM Rise Patience, MD      . digoxin (LANOXIN) tablet 0.0625 mg  0.0625 mg Oral Daily Rise Patience, MD      . dorzolamide-timolol (COSOPT) 22.3-6.8 MG/ML ophthalmic solution 1 drop  1 drop Both Eyes BID Rise Patience, MD   1 drop at 05/13/17 1113  . doxercalciferol (HECTOROL) injection 1 mcg  1 mcg Intravenous Q M,W,F-HD Valentina Gu, NP      . feeding supplement (NEPRO CARB STEADY) liquid 237 mL  237 mL Oral Q M,W,F Rise Patience, MD   237 mL at 05/13/17 1112  . insulin aspart (novoLOG) injection 0-9 Units  0-9 Units Subcutaneous TID WC Rise Patience, MD      . loratadine (CLARITIN) tablet 10 mg  10 mg Oral Daily Rise Patience, MD      . metoprolol tartrate (LOPRESSOR) tablet 50 mg  50 mg Oral BID Rise Patience, MD   50 mg at 05/13/17 0244  . midodrine (PROAMATINE) tablet 10 mg  10 mg Oral Q M,W,F-HD Rise Patience, MD      . midodrine (PROAMATINE) tablet 5-15 mg  5-15 mg Oral Daily PRN Rise Patience, MD      . multivitamin (RENA-VIT) tablet 1 tablet  1 tablet Oral QHS Rise Patience, MD   1 tablet at 05/13/17 0243  . ondansetron (ZOFRAN) tablet 4 mg  4 mg Oral Q6H PRN Rise Patience, MD       Or  . ondansetron [facility]) injection 4 mg  4 mg Intravenous Q6H PRN Rise Patience, MD      . pantoprazole (PROTONIX) EC tablet 40 mg  40 mg Oral Daily Rise Patience, MD      . polyethylene glycol (MIRALAX / GLYCOLAX) packet 17 g  17 g Oral Daily Rise Patience, MD      . predniSONE (DELTASONE) tablet 5 mg  5 mg Oral QHS Rise Patience, MD   5 mg at 05/13/17 0243  . senna-docusate (Senokot-S) tablet 1 tablet  1 tablet Oral QHS PRN Rise Patience, MD      . sertraline (ZOLOFT) tablet 50 mg  50 mg Oral Daily Rise Patience, MD      . warfarin (COUMADIN) tablet  2.5 mg  2.5 mg  Oral Q M,W,F-1800 Erenest Blank, RPH      . warfarin (COUMADIN) tablet 5 mg  5 mg Oral Q T,Th,S,Su-1800 Erenest Blank, RPH   5 mg at 05/13/17 0248  . Warfarin - Pharmacist Dosing Inpatient   Does not apply q1800 Erenest Blank, [facility]        Allergies as of 05/12/2017 - Review Complete 05/12/2017  Allergen Reaction Noted  . Penicillins Swelling and Rash 01/21/2011  . Codeine Nausea Only 06/27/2014  . Tramadol Nausea Only 06/27/2014    Family History  Problem Relation Age of Onset  . Stomach cancer Mother   . Hypertension Father        Died of heart attack  . Heart attack Father   . Stroke Sister   . Heart disease Sister   . Cancer Brother   . Colon cancer Neg Hx     Social History   Socioeconomic History  . Marital status: Married    Spouse name: Not on file  . Number of children: 3  . Years of education: 26  . Highest education level: Not on file  Social Needs  . Financial resource strain: Not on file  . Food insecurity - worry: Not on file  . Food insecurity - inability: Not on file  . Transportation needs - medical: Not on file  . Transportation needs - non-medical: Not on file  Occupational History  . 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 Smoking status: Former Smoker    Packs/day: 2.00    Years: 45.00    Pack years: 90.00    Types: Cigarettes    Last attempt to quit: 01/20/1998    Years since quitting: 19.3  . Smokeless tobacco: Never Used  Substance and Sexual Activity  . Alcohol use: No    Alcohol/week: 0.0 oz  . Drug use: No  . Sexual activity: Yes  Other Topics Concern  . Not on file  Social History Narrative   Lives at home w/ his wife   Right-handed   Drinks 1 cup of coffee per day    Review of Systems: Gen: Denies any fever, chills, sweats, anorexia, + fatigue,  + weakness, malaise, weight loss, and sleep disorder HEENT: No visual complaints, No history of Retinopathy. Normal external  appearance No Epistaxis or Sore throat. No sinusitis.   CV: History of TAVR for aortic stenosis   Pacemaker Afib Resp: Denies dyspnea at rest, dyspnea with exercise, cough, sputum, wheezing, coughing up blood, and pleurisy. GI: Denies vomiting blood, jaundice, and fecal incontinence.   Denies dysphagia or odynophagia. GU : Denies urinary burning, blood in urine, urinary frequency, urinary hesitancy, nocturnal urination, and urinary incontinence.  No renal calculi. MS: Denies joint pain, limitation of movement, and swelling, stiffness, low back pain, extremity pain. Denies muscle weakness, cramps, atrophy.  No use of non steroidal antiinflammatory drugs. Derm: Denies rash, itching, dry skin, hives, moles, warts, or unhealing ulcers.  Psych: Denies depression, anxiety, memory loss, suicidal ideation, hallucinations, paranoia, and confusion. Heme: Denies bruising, bleeding, and enlarged lymph nodes. Neuro: No headache.  No diplopia. No dysarthria.  No dysphasia.  No history of CVA.  No Seizures. No paresthesias.  No weakness. Endocrine  DM.  No Thyroid disease.  No Adrenal disease.  Physical Exam: Vital signs in last 24 hours: Temp:  [97.9 F (36.6 C)-98.5 F (36.9 C)] 97.9 F (36.6 C) (02/15 0844) Pulse Rate:  [65-74] 70 (02/15 0844) Resp:  [12-25]  16 (02/15 0844) BP: (98-151)/(50-97) 133/81 (02/15 0844) SpO2:  [87 %-98 %] 97 % (02/15 0844) Weight:  [195 lb 8.8 oz (88.7 kg)] 195 lb 8.8 oz (88.7 kg) (02/15 0558)   General:   Alert,  Well-developed, well-nourished, pleasant and cooperative in NAD Head:  Normocephalic and atraumatic. Eyes:  Sclera clear, no icterus.   Conjunctiva pink. Ears:  Normal auditory acuity. Nose:  No deformity, discharge,  or lesions. Mouth:  No deformity or lesions, dentition normal. Neck:  Supple; no masses or thyromegaly. JVP not elevated Lungs:  Clear throughout to auscultation.   No wheezes, crackles, or rhonchi. No acute distress. Heart:  Regular rate and  rhythm; no murmurs, clicks, rubs,  or gallops. Abdomen:  Soft, nontender and nondistended. No masses, hepatosplenomegaly or hernias noted. Normal bowel sounds, without guarding, and without rebound.   Msk:  Symmetrical without gross deformities. Normal posture. Pulses:  No carotid, renal, femoral bruits. DP and PT symmetrical and equal Extremities:  Without clubbing or edema. Neurologic:  Alert and  oriented x4;  grossly normal neurologically. Skin:  Intact without significant lesions or rashes. Cervical Nodes:  No significant cervical adenopathy. Psych:  Alert and cooperative. Normal mood and affect.  Intake/Output from previous day: No intake/output data recorded. Intake/Output this shift: No intake/output data recorded.  Lab Results: Recent Labs    05/12/17 1516 05/13/17 0611  WBC 10.9* 9.3  HGB 11.6* 11.5*  HCT 36.7* 36.6*  PLT 187 150   BMET Recent Labs    05/12/17 1516  NA 138  K 3.5  CL 95*  CO2 29  GLUCOSE 80  BUN 25*  CREATININE 6.20*  CALCIUM 9.7   LFT No results for input(s): PROT, ALBUMIN, AST, ALT, ALKPHOS, BILITOT, BILIDIR, IBILI in the last 72 hours. PT/INR Recent Labs    05/12/17 1516 05/13/17 0611  LABPROT 23.3* 23.4*  INR 2.09 2.10   Hepatitis Panel No results for input(s): HEPBSAG, HCVAB, HEPAIGM, HEPBIGM in the last 72 hours.  Studies/Results: Dg Chest 2 View  Result Date: 05/12/2017 CLINICAL DATA:  Syncopal episode EXAM: CHEST  2 VIEW COMPARISON:  04/28/2017 FINDINGS: Cardiac shadow is enlarged. Pacing device is again seen. Changes consistent with prior TAVR are noted and stable. The lungs are well aerated bilaterally. No focal infiltrate or sizable effusion is seen. No bony abnormality is noted. IMPRESSION: No acute abnormality noted. Electronically Signed   By: Inez Catalina M.D.   On: 05/12/2017 15:57   Ct Head Wo Contrast  Result Date: 05/12/2017 CLINICAL DATA:  Head injury after fall. Possible loss of consciousness. EXAM: CT HEAD  WITHOUT CONTRAST TECHNIQUE: Contiguous axial images were obtained from the base of the skull through the vertex without intravenous contrast. COMPARISON:  CT scan of November 26, 2016. FINDINGS: Brain: Left occipital encephalomalacia is noted consistent with old infarction. Mild diffuse cortical atrophy is noted. Mild chronic ischemic white matter disease is noted. No mass effect or midline shift is noted. Ventricular size is within normal limits. There is no evidence of mass lesion, hemorrhage or acute infarction. Vascular: No hyperdense vessel or unexpected calcification. Skull: Normal. Negative for fracture or focal lesion. Sinuses/Orbits: No acute finding. Other: None. IMPRESSION: Old left occipital infarction. Mild diffuse cortical atrophy. Mild chronic ischemic white matter disease. No acute intracranial abnormality seen. Electronically Signed   By: Marijo Conception, M.D.   On: 05/12/2017 16:18   Ct Cervical Spine Wo Contrast  Result Date: 05/13/2017 CLINICAL DATA:  Two falls in 2 days. EXAM: CT  CERVICAL SPINE WITHOUT CONTRAST TECHNIQUE: Multidetector CT imaging of the cervical spine was performed without intravenous contrast. Multiplanar CT image reconstructions were also generated. COMPARISON:  CT cervical spine November 25, 2016 FINDINGS: ALIGNMENT: Straightened cervical lordosis. Stable grade 1 (4 mm) C3-4 anterolisthesis. SKULL BASE AND VERTEBRAE: Cervical vertebral bodies and posterior elements are intact. Stable severe C4-5 through C6-7 disc height loss with endplate sclerosis and marginal spurring compatible with degenerative discs. C1-2 articulation maintained with severe arthropathy. Small amount of calcified pannus about the odontoid process seen with CPPD. Calcified longus coli insertion associated with calcific tendinopathy. Severe LEFT upper cervical facet arthropathy. Probable bone island RIGHT C7 facet. SOFT TISSUES AND SPINAL CANAL: Included view of the brain demonstrates old large LEFT PCA  territory infarct. Severe calcific atherosclerosis carotid bifurcations. DISC LEVELS: Mild canal stenosis C5-6. Severe LEFT C3-4, moderate to severe bilateral C4-5, severe bilateral C5-6 and C6-7 neural foraminal narrowing. UPPER CHEST: Lung apices are clear.  RIGHT cardiac pacemaker. OTHER: None. IMPRESSION: 1. No acute fracture. Stable grade 1 C3-4 anterolisthesis on degenerative basis. 2. Mild canal stenosis C5-6. Severe neural foraminal narrowing C3-4, C5-6 and C6-7. 3. Severe ICA atherosclerosis with suspected hemodynamically significant stenosis which could be quantified with CTA NECK on nonemergent basis. Electronically Signed   By: Elon Alas M.D.   On: 05/13/2017 00:48    Assessment/Plan:  ESRD- MWF dialysis   ANEMIA-  Hb 11.5   MBD- binders and vitamin D   HTN/VOL controlled     LOS: 0 Shunna Mikaelian W @TODAY @12 :03 PM  HD orders: [place] MWF 4 hrs 180 NRe 450/Auto 1.5 87.5 kg 2.0 K/2.0 Ca Linear Sodium -Heparin 2800 units IV TIW -Mircera 50 mcg IV q 2 weeks (last dose 05/02/17) -Hectorol 1 mcg IV TIW   Juanell Fairly NP-C [place] [phone] (pager)

## 2017-05-13 NOTE — Progress Notes (Signed)
Alexander [name] is a 77 Y/O caucasian male with ESRD on hemodialysis MWF at [facility]. PMH significant for DM, HTN, H/O Afib on coumadin, mild dementia, macular degeneration, moderate AS, OSA, H/O pituitary adenoma, permanent pacemaker placement.   He has been admitted as observation patient for syncope/mechanical fall at dentist office. CT of head without acute abnormalities, Troponin 0.09, EKG shows AFib, LBBB. We will order and supervise dialysis today. Please notify us if patient status upgraded to inpatient and we will consult formally.   HD orders: [place] MWF 4 hrs 180 NRe 450/Auto 1.5 87.5 kg 2.0 K/2.0 Ca Linear Sodium -Heparin 2800 units IV TIW -Mircera 50 mcg IV q 2 weeks (last dose 05/02/17) -Hectorol 1 mcg IV TIW   Juanell Fairly NP-C [place] [phone] (pager)

## 2017-05-13 NOTE — Evaluation (Addendum)
Physical Therapy Evaluation/Discharge Patient Details Name: Alexander [name] MRN: [id number] DOB: 1940/06/26 Today's Date: 05/13/2017   History of Present Illness  Pt adm with 2 near syncopal episodes with fall. PMH - ESRD on HD, macular degeneration/legal blindness, pacer, afib, cva, depression, dm, peripheral neuropathy,   Clinical Impression  Pt presents to PT at baseline with mobility and no orthostasis (see Vitals flowsheet). Recommend return home with wife and HHPT. No further acute PT.    Follow Up Recommendations Home health PT(resume HHPT)    Equipment Recommendations  None recommended by PT    Recommendations for Other Services       Precautions / Restrictions Precautions Precautions: Fall Restrictions Weight Bearing Restrictions: No      Mobility  Bed Mobility Overal bed mobility: Independent                Transfers Overall transfer level: Needs assistance Equipment used: 4-wheeled walker;None Transfers: Sit to/from Stand Sit to Stand: Supervision         General transfer comment: Supervision for safety in unfamiliar environment and pt with poor vision. Verbal cues for hand placement  Ambulation/Gait Ambulation/Gait assistance: Supervision Ambulation Distance (Feet): 200 Feet(200' x 1, 150' x 1) Assistive device: 4-wheeled walker Gait Pattern/deviations: Step-through pattern;Trunk flexed;Decreased stride length Gait velocity: decr Gait velocity interpretation: Below normal speed for age/gender General Gait Details: supervision for safety due to poor vision and unfamiliar environment. Verbal cues to stand more erecr  Stairs            Wheelchair Mobility    Modified Rankin (Stroke Patients Only)       Balance Overall balance assessment: Needs assistance Sitting-balance support: No upper extremity supported;Feet supported Sitting balance-Leahy Scale: Good     Standing balance support: No upper extremity supported Standing  balance-Leahy Scale: Fair                               Pertinent Vitals/Pain Pain Assessment: No/denies pain    Home Living Family/patient expects to be discharged to:: Private residence Living Arrangements: Spouse/significant other Available Help at Discharge: Available PRN/intermittently Type of Home: House         Home Equipment: Gilford Rile - 4 wheels      Prior Function Level of Independence: Needs assistance   Gait / Transfers Assistance Needed: amb with rollator  ADL's / Homemaking Assistance Needed: wife helps with bathing and dressing as needed        Hand Dominance        Extremity/Trunk Assessment   Upper Extremity Assessment Upper Extremity Assessment: Overall WFL for tasks assessed    Lower Extremity Assessment Lower Extremity Assessment: Generalized weakness       Communication   Communication: Expressive difficulties  Cognition Arousal/Alertness: Awake/alert Behavior During Therapy: WFL for tasks assessed/performed Overall Cognitive Status: History of cognitive impairments - at baseline                                        General Comments      Exercises Other Exercises Other Exercises: Repeated sit to stand x 5   Assessment/Plan    PT Assessment All further PT needs can be met in the next venue of care  PT Problem List Decreased strength;Decreased activity tolerance;Decreased balance       PT Treatment Interventions  PT Goals (Current goals can be found in the Care Plan section)  Acute Rehab PT Goals PT Goal Formulation: All assessment and education complete, DC therapy    Frequency     Barriers to discharge        Co-evaluation               AM-PAC PT "6 Clicks" Daily Activity  Outcome Measure Difficulty turning over in bed (including adjusting bedclothes, sheets and blankets)?: None Difficulty moving from lying on back to sitting on the side of the bed? : None Difficulty sitting  down on and standing up from a chair with arms (e.g., wheelchair, bedside commode, etc,.)?: A Little Help needed moving to and from a bed to chair (including a wheelchair)?: None Help needed walking in hospital room?: A Little Help needed climbing 3-5 steps with a railing? : A Little 6 Click Score: 21    End of Session Equipment Utilized During Treatment: Gait belt Activity Tolerance: Patient tolerated treatment well Patient left: in bed;with call bell/phone within reach;with bed alarm set;with family/visitor present(sitting EOB with lunch) Nurse Communication: Mobility status PT Visit Diagnosis: Unsteadiness on feet (R26.81);Muscle weakness (generalized) (M62.81);History of falling (Z91.81)    Time: 0626-9485 PT Time Calculation (min) (ACUTE ONLY): 36 min   Charges:   PT Evaluation $PT Eval Moderate Complexity: 1 Mod PT Treatments $Gait Training: 8-22 mins   PT G Codes:        [facility] PT [place] 05/13/2017, 2:05 PM

## 2017-05-13 NOTE — Discharge Instructions (Signed)

## 2017-05-13 NOTE — Progress Notes (Signed)
Hospitalist progress note   Alexander [name]  PYK:[id number] DOB: 09/27/1940 DOA: 05/12/2017 PCP: Deland Pretty, MD   Specialists:   Brief Narrative:   7 male A. fib chads score >3 on Coumadin +amiodarone,bradycardia status post [place] PPM 12 14/60,   08/2016 CVA 2004,  diabetes mellitus + neuropathy,  emphysema former prior smoker quit 1999,  macular degeneration  ESRD Monday Wednesday Friday at [facility],  reflux,  bradycardia,  carotid disease-occlusion left carotid,  legal blindness secondary to macular degeneration,  arthritis on low-dose prednisone-?  Pseudogout?  Carpal tunnel  streptococcal bacteremia presumed secondary to dialysis access 9/18-other workup at that time negative Seen in cardiology office 03/15/17-reports dropping of blood pressure and started taking Midodrin Monday Wednesday Friday prior to dialysis-was mentioned to take an additional 15 of Midrin if blood pressure drops-recently discontinued off Cardizem  Admit 2/14 with fall at dental office without LOC-EKG A. fib with LBBB troponin positive   Assessment & Plan:   Assessment:  The primary encounter diagnosis was Near syncope. Diagnoses of Minor head injury, initial encounter and Elevated troponin were also pertinent to this visit.  Syncope-monitor on telemetry and check orthostatic vital signs and ask therapy to see - continue Midodrine 10 mg Monday Wednesday Friday extra dosing as needed--difficult situation as if no occult arrhythmia, is dependent on rate control with beta-blocker ?? a complete left carotid occlusion -? contribute in addition to dizzy--also can be associated with dialysis and I feel these issues are multifactorial-? PPM interroga tion  history is more significant for orthostasis home for about couple of hours helping his wife do some cooking then got up and felt dizzy and he also had a second episode when he was going in the car to the dentist after 20-30-minute ride-his family does  state that he does get occasionally dizzy   A. fib chads score >3 on Coumadin +amiodarone,bradycardia status post [place] PPM 12 14/60-complicated by syncope--consulted cardiology re anticoagulation-continue metoprolol 50 twice daily for rate control, Cardizem discontinued on admission  08/2016 CVA 2004-on Coumadin currently so no addition of aspirin especially in light of falls  diabetes mellitus + neuropathy-is on chronic low-dose prednisone  emphysema former prior smoker quit 1999-follows with Dr. Rolla Etienne of Pulmonary-will need OP discussion re: the same  macular degeneration legal blindness-probably a contributor to some of his difficulty moving around  ESRD Monday Wednesday Friday at [facility]-nephrology to consult and will need dialysis might need to adjust EDW--going forward . if no arrhythmias as above discussion-weight in 2017 210 pounds, today 195   reflux-continue PPI  carotid disease-occlusion left carotid,  legal blindness secondary to macular degeneration,   arthritis on low-dose prednisone-?  Pseudogout?  Carpal tunnel   streptococcal bacteremia presumed secondary to dialysis access 9/18-other workup at that time negative   DVT prophylaxis: Coumadin code Status:   Full   Family Communication:   Full Disposition Plan:  Inpatient-will need Therapy eval going forawrd   Consultants:   cards  Procedures:   none  Antimicrobials:   none  Subjective: Awake alert pleasant just finished working with therapy not dizzy orthostatics were negative No chest pain no nausea no vomiting no shortness of breath   Objective: Vitals:   05/12/17 2000 05/12/17 2030 05/12/17 2100 05/13/17 0558  BP: (!) 143/67 138/69 (!) 142/69 98/62  Pulse: 70 69 71 69  Resp: 14 18 15 17   Temp:    97.9 F (36.6 C)  TempSrc:    Oral  SpO2: 93%  97% 94% 95%  Weight:    88.7 kg (195 lb 8.8 oz)    Intake/Output Summary (Last 24 hours) at 05/13/2017 0802 Last data filed at  05/13/2017 0600 Gross per 24 hour  Intake 0 ml  Output 0 ml  Net 0 ml   Filed Weights   05/13/17 0558  Weight: 88.7 kg (195 lb 8.8 oz)    Examination: Poor vision by direct confrontation Chest is clinically clear Abdomen is soft nontender no rebound no guarding No lower extremity edema s1 s2 no murmur rub or gallop Neuro intact moving 4 limbs without deficit or focal finding   Data Reviewed: I have personally reviewed following labs and imaging studies  CBC: Recent Labs  Lab 05/12/17 1516 05/13/17 0611  WBC 10.9* 9.3  NEUTROABS 8.0*  --   HGB 11.6* 11.5*  HCT 36.7* 36.6*  MCV 101.1* 101.9*  PLT 187 338   Basic Metabolic Panel: Recent Labs  Lab 05/12/17 1516 05/13/17 0051  NA 138  --   K 3.5  --   CL 95*  --   CO2 29  --   GLUCOSE 80  --   BUN 25*  --   CREATININE 6.20*  --   CALCIUM 9.7  --   MG  --  1.8   GFR: Estimated Creatinine Clearance: 10.6 mL/min (A) (by C-G formula based on SCr of 6.2 mg/dL (H)). Liver Function Tests: No results for input(s): AST, ALT, ALKPHOS, BILITOT, PROT, ALBUMIN in the last 168 hours. No results for input(s): LIPASE, AMYLASE in the last 168 hours. No results for input(s): AMMONIA in the last 168 hours. Coagulation Profile: Recent Labs  Lab 05/12/17 1516 05/13/17 0611  INR 2.09 2.10   Cardiac Enzymes: Recent Labs  Lab 05/13/17 0051  TROPONINI 0.09*   CBG: Recent Labs  Lab 05/12/17 1625 05/12/17 2210 05/13/17 0748  GLUCAP 82 81 94   Urine analysis:    Component Value Date/Time   COLORURINE YELLOW 11/12/2014 1318   APPEARANCEUR CLEAR 11/12/2014 1318   LABSPEC 1.016 11/12/2014 1318   PHURINE 5.5 11/12/2014 1318   GLUCOSEU NEGATIVE 11/12/2014 1318   HGBUR MODERATE (A) 11/12/2014 1318   BILIRUBINUR SMALL (A) 11/12/2014 1318   KETONESUR NEGATIVE 11/12/2014 1318   PROTEINUR 100 (A) 11/12/2014 1318   UROBILINOGEN 0.2 11/12/2014 1318   NITRITE NEGATIVE 11/12/2014 1318   LEUKOCYTESUR MODERATE (A) 11/12/2014  1318     Radiology Studies: Reviewed images personally in health database    Scheduled Meds: . bromocriptine  5 mg Oral QHS  . calcium acetate  2,668 mg Oral TID WC & HS  . cinacalcet  30 mg Oral QPM  . digoxin  0.0625 mg Oral Daily  . dorzolamide-timolol  1 drop Both Eyes BID  . feeding supplement (NEPRO CARB STEADY)  237 mL Oral Q M,W,F  . insulin aspart  0-9 Units Subcutaneous TID WC  . loratadine  10 mg Oral Daily  . metoprolol tartrate  50 mg Oral BID  . midodrine  10 mg Oral Q M,W,F-HD  . multivitamin  1 tablet Oral QHS  . pantoprazole  40 mg Oral Daily  . polyethylene glycol  17 g Oral Daily  . predniSONE  5 mg Oral QHS  . sertraline  50 mg Oral Daily  . warfarin  2.5 mg Oral Q M,W,F-1800  . warfarin  5 mg Oral Q T,Th,S,Su-1800  . Warfarin - Pharmacist Dosing Inpatient   Does not apply q1800   Continuous Infusions:  LOS: 0 days    Time spent: [place], MD [facility]   If 7PM-7AM, please contact night-coverage [email or web address] Password [email or web address] 05/13/2017, 8:02 AM

## 2017-05-13 NOTE — Progress Notes (Signed)
ANTICOAGULATION CONSULT NOTE - Follow Up Consult  Pharmacy Consult for Coumadin Indication: atrial fibrillation  Patient Measurements: Height: 5' 10.5" (179.1 cm) Weight: 195 lb 8.8 oz (88.7 kg) IBW/kg (Calculated) : 74.15  Vital Signs: Temp: 97.9 F (36.6 C) (02/15 0844) Temp Source: Oral (02/15 0844) BP: 133/81 (02/15 0844) Pulse Rate: 70 (02/15 0844)  Labs: Recent Labs    05/12/17 1516 05/13/17 0051 05/13/17 0611 05/13/17 1221  HGB 11.6*  --  11.5*  --   HCT 36.7*  --  36.6*  --   PLT 187  --  150  --   LABPROT 23.3*  --  23.4*  --   INR 2.09  --  2.10  --   CREATININE 6.20*  --  7.58*  --   TROPONINI  --  0.09* 0.09* 0.08*   ESRD  Assessment:   77 yr old male continues on Coumadin as prior to admission for atrial fibrillation.  INR is therapeutic (2.10).   2/14 Coumadin dose not given til ~3am today.    Home Coumadin regimen: 2.5 mg MWF, 5 mg TTSS.  Goal of Therapy:  INR 2-3 Monitor platelets by anticoagulation protocol: Yes   Plan:   Continue Coumadin 2.5 mg MWF and 5 mg TTSS.  Tonight's Coumadin dose moved from 6pm to 10pm.    Daily PT/INR for now.  Arty Baumgartner, [place] Pager: [phone] 05/13/2017,4:28 PM

## 2017-05-13 NOTE — Progress Notes (Signed)
Patient arrived to unit per bed.  Reviewed treatment plan and this RN agrees.  Report received from bedside RN, Gwenlyn Perking.  Consent obtained.  Patient A & O X 4. Lung sounds diminished to ausculation in all fields. No edema. Cardiac: V paced.  Prepped LLAVF with alcohol and cannulated with two 15 gauge needles.  Pulsation of blood noted.  Flushed access well with saline per protocol.  Connected and secured lines and initiated tx at 1615.  UF goal of 1600 mL and net fluid removal of 1100 mL.  Will continue to monitor.

## 2017-05-13 NOTE — Care Management Note (Addendum)
Case Management Note  Patient Details  Name: Alexander [name] MRN: [id number] Date of Birth: 02-21-1941  Subjective/Objective:  Admitted as observation patient for syncope/mechanical fall at dentist office. EKG shows A. fib with LBBB.  Troponin is mildly positive.   Action/Plan: Prior to admission patient lived at home with spouse.  Home DME: rollator; Walker - 4 wheels.  PCP is Deland Pretty. Uses [facility] on [address].  Active with [facility] for PT only.  Prior to being admitted patient completed all his ADL's.  Denies inability to afford medications or food.  Discussed   Recommendations for [facility] PT and they would like to continue using [facility].  Notification of client being inpatient called to [place].    Expected Discharge Date:                  Expected Discharge Plan:  Home/Self Care  Discharge planning Services  CM Consult   Choice offered to:   Patient, wife, daughter   HH Arranged:   resumption of PT [place] Agency:   [facility]  Status of Service:  In process, will continue to follow  Additional Comments:  Kristen Cardinal, RN  Nurse Case Manager [place] 05/13/2017, 11:59 AM

## 2017-05-14 ENCOUNTER — Inpatient Hospital Stay (HOSPITAL_COMMUNITY): Payer: Medicare Other

## 2017-05-14 DIAGNOSIS — I361 Nonrheumatic tricuspid (valve) insufficiency: Secondary | ICD-10-CM

## 2017-05-14 DIAGNOSIS — R55 Syncope and collapse: Secondary | ICD-10-CM

## 2017-05-14 LAB — RESPIRATORY PANEL BY PCR
ADENOVIRUS-RVPPCR: NOT DETECTED
Bordetella pertussis: NOT DETECTED
CHLAMYDOPHILA PNEUMONIAE-RVPPCR: NOT DETECTED
CORONAVIRUS HKU1-RVPPCR: NOT DETECTED
CORONAVIRUS NL63-RVPPCR: NOT DETECTED
CORONAVIRUS OC43-RVPPCR: NOT DETECTED
Coronavirus 229E: NOT DETECTED
Influenza A: NOT DETECTED
Influenza B: NOT DETECTED
Metapneumovirus: NOT DETECTED
Mycoplasma pneumoniae: NOT DETECTED
PARAINFLUENZA VIRUS 1-RVPPCR: NOT DETECTED
PARAINFLUENZA VIRUS 2-RVPPCR: NOT DETECTED
Parainfluenza Virus 3: NOT DETECTED
Parainfluenza Virus 4: NOT DETECTED
RHINOVIRUS / ENTEROVIRUS - RVPPCR: NOT DETECTED
Respiratory Syncytial Virus: NOT DETECTED

## 2017-05-14 LAB — GLUCOSE, CAPILLARY
Glucose-Capillary: 117 mg/dL — ABNORMAL HIGH (ref 65–99)
Glucose-Capillary: 130 mg/dL — ABNORMAL HIGH (ref 65–99)

## 2017-05-14 LAB — LIPID PANEL
CHOLESTEROL: 203 mg/dL — AB (ref 0–200)
HDL: 27 mg/dL — ABNORMAL LOW (ref >40)
LDL Cholesterol: 121 mg/dL — ABNORMAL HIGH (ref 0–99)
TRIGLYCERIDES: 273 mg/dL — AB (ref <150)
Total CHOL/HDL Ratio: 7.5 RATIO
VLDL: 55 mg/dL — ABNORMAL HIGH (ref 0–40)

## 2017-05-14 LAB — ECHOCARDIOGRAM COMPLETE
HEIGHTINCHES: 70.5 in
Weight: 3086.44 oz

## 2017-05-14 LAB — VAS US CAROTID
LCCAPDIAS: 1 cm/s
LEFT ECA DIAS: -7 cm/s
Left CCA dist dias: 4 cm/s
Left CCA dist sys: 51 cm/s
Left CCA prox sys: 61 cm/s
RCCAPDIAS: 18 cm/s
RIGHT ECA DIAS: -11 cm/s
RIGHT VERTEBRAL DIAS: 13 cm/s
Right CCA prox sys: 105 cm/s
Right cca dist sys: -94 cm/s

## 2017-05-14 LAB — PROTIME-INR
INR: 2.05
Prothrombin Time: 22.9 seconds — ABNORMAL HIGH (ref 11.4–15.2)

## 2017-05-14 NOTE — Progress Notes (Signed)
  Echocardiogram 2D Echocardiogram has been performed.  Darlina Sicilian M 05/14/2017, 8:35 AM

## 2017-05-14 NOTE — Care Management Note (Signed)
Case Management Note  Patient Details  Name: Alexander [name] MRN: [id number] Date of Birth: 1940-05-28  Subjective/Objective:     Presented s/p fall, hx of ESRD on hemodialysis, pacemaker placement for bradycardia, aortic stenosis status post TAVR, ESRD on hemodialysis on Monday Wednesday and Friday, CAD, diabetes mellitus, anemia.  Alexander [name] (Spouse) Alexander [name] [phone]     PCP: Deland Pretty  Action/Plan: Transition to home today with home health services to follow.   Expected Discharge Date:  05/14/17               Expected Discharge Plan:  [place]  In-House Referral:     Discharge planning Services  CM Consult  Post Acute Care Choice:    Choice offered to:  Spouse, Patient  DME Arranged:   N/A DME Agency:   N/A  HH Arranged:  PT, RN, Nurse's Aide [place] Agency:  [facility] ([facility] referral made with Alwyn Ren @ [phone]  Status of Service:  Completed, signed off  If discussed at [place] of Stay Meetings, dates discussed:    Additional Comments:  Sharin Mons, RN 05/14/2017, 1:01 PM

## 2017-05-14 NOTE — Progress Notes (Signed)
Patient discharged to home, AVS reviewed including medications, IV removed, telebox returned. Patient left floor via wheelchair with staff member

## 2017-05-14 NOTE — Progress Notes (Signed)
Progress Note  Patient Name: Alexander [name] Date of Encounter: 05/14/2017  Primary Cardiologist: Skeet Latch, MD   Subjective   Feeling well.  No chest pain or shortness of breath.  No dizziness.  Tolerated HD well.   Inpatient Medications    Scheduled Meds: . bromocriptine  5 mg Oral QHS  . calcium acetate  2,668 mg Oral TID WC & HS  . cinacalcet  30 mg Oral QPM  . digoxin  0.0625 mg Oral Daily  . dorzolamide-timolol  1 drop Both Eyes BID  . doxercalciferol  1 mcg Intravenous Q M,W,F-HD  . feeding supplement (NEPRO CARB STEADY)  237 mL Oral Q M,W,F  . insulin aspart  0-9 Units Subcutaneous TID WC  . loratadine  10 mg Oral Daily  . metoprolol tartrate  50 mg Oral BID  . midodrine  10 mg Oral Q M,W,F-HD  . multivitamin  1 tablet Oral QHS  . pantoprazole  40 mg Oral Daily  . polyethylene glycol  17 g Oral Daily  . predniSONE  5 mg Oral QHS  . sertraline  50 mg Oral Daily  . warfarin  2.5 mg Oral Q M,W,F-1800  . warfarin  5 mg Oral Q T,Th,S,Su-1800  . Warfarin - Pharmacist Dosing Inpatient   Does not apply q1800   Continuous Infusions:  PRN Meds: acetaminophen **OR** acetaminophen, midodrine, ondansetron **OR** ondansetron (ZOFRAN) IV, senna-docusate   Vital Signs    Vitals:   05/13/17 2015 05/13/17 2018 05/13/17 2053 05/14/17 0623  BP: 128/66 94/84 (!) 119/53 101/72  Pulse: 70 61 72 74  Resp: 19  18 18   Temp: 98 F (36.7 C)  98.1 F (36.7 C) 98.2 F (36.8 C)  TempSrc:   Oral Oral  SpO2:   99% 97%  Weight: 192 lb 14.4 oz (87.5 kg)     Height:        Intake/Output Summary (Last 24 hours) at 05/14/2017 1016 Last data filed at 05/14/2017 0900 Gross per 24 hour  Intake 480 ml  Output 1100 ml  Net -620 ml   Filed Weights   05/13/17 0558 05/13/17 1600 05/13/17 2015  Weight: 195 lb 8.8 oz (88.7 kg) 195 lb 5.2 oz (88.6 kg) 192 lb 14.4 oz (87.5 kg)    Telemetry    Atrial fibrillation.  Ventricular pacing. - Personally Reviewed  ECG    n/a-  Personally Reviewed  Physical Exam   VS:  BP 101/72 (BP Location: Right Arm)   Pulse 74   Temp 98.2 F (36.8 C) (Oral)   Resp 18   Ht 5' 10.5" (1.791 m)   Wt 192 lb 14.4 oz (87.5 kg)   SpO2 97%   BMI 27.29 kg/m  , BMI Body mass index is 27.29 kg/m. GENERAL: Chronically ill-appearing HEENT: Pupils equal round and reactive, fundi not visualized, oral mucosa unremarkable NECK:  No jugular venous distention, waveform within normal limits, carotid upstroke brisk and symmetric, no bruit LUNGS:  Clear to auscultation bilaterally HEART:  RRR.  PMI not displaced or sustained,S1 and S2 within normal limits, no S3, no S4, no clicks, no rubs, III/VI systolic murmurs ABD:  Flat, positive bowel sounds normal in frequency in pitch, no bruits, no rebound, no guarding, no midline pulsatile mass, no hepatomegaly, no splenomegaly EXT:  2 plus pulses throughout, no edema, no cyanosis no clubbing SKIN:  No rashes no nodules NEURO:  Cranial nerves II through XII grossly intact, motor grossly intact throughout PSYCH:  Cognitively intact, oriented to person  place and time   Labs    Chemistry Recent Labs  Lab 05/12/17 1516 05/13/17 0611  NA 138 141  K 3.5 3.9  CL 95* 96*  CO2 29 26  GLUCOSE 80 113*  BUN 25* 33*  CREATININE 6.20* 7.58*  CALCIUM 9.7 9.7  GFRNONAA 8* 6*  GFRAA 9* 7*  ANIONGAP 14 19*     Hematology Recent Labs  Lab 05/12/17 1516 05/13/17 0611  WBC 10.9* 9.3  RBC 3.63* 3.59*  HGB 11.6* 11.5*  HCT 36.7* 36.6*  MCV 101.1* 101.9*  MCH 32.0 32.0  MCHC 31.6 31.4  RDW 20.2* 20.2*  PLT 187 150    Cardiac Enzymes Recent Labs  Lab 05/13/17 0051 05/13/17 0611 05/13/17 1221  TROPONINI 0.09* 0.09* 0.08*    Recent Labs  Lab 05/12/17 1529 05/12/17 1755  TROPIPOC 0.09* 0.09*     BNPNo results for input(s): BNP, PROBNP in the last 168 hours.   DDimer No results for input(s): DDIMER in the last 168 hours.   Radiology    Dg Chest 2 View  Result Date:  05/12/2017 CLINICAL DATA:  Syncopal episode EXAM: CHEST  2 VIEW COMPARISON:  04/28/2017 FINDINGS: Cardiac shadow is enlarged. Pacing device is again seen. Changes consistent with prior TAVR are noted and stable. The lungs are well aerated bilaterally. No focal infiltrate or sizable effusion is seen. No bony abnormality is noted. IMPRESSION: No acute abnormality noted. Electronically Signed   By: Inez Catalina M.D.   On: 05/12/2017 15:57   Ct Head Wo Contrast  Result Date: 05/12/2017 CLINICAL DATA:  Head injury after fall. Possible loss of consciousness. EXAM: CT HEAD WITHOUT CONTRAST TECHNIQUE: Contiguous axial images were obtained from the base of the skull through the vertex without intravenous contrast. COMPARISON:  CT scan of November 26, 2016. FINDINGS: Brain: Left occipital encephalomalacia is noted consistent with old infarction. Mild diffuse cortical atrophy is noted. Mild chronic ischemic white matter disease is noted. No mass effect or midline shift is noted. Ventricular size is within normal limits. There is no evidence of mass lesion, hemorrhage or acute infarction. Vascular: No hyperdense vessel or unexpected calcification. Skull: Normal. Negative for fracture or focal lesion. Sinuses/Orbits: No acute finding. Other: None. IMPRESSION: Old left occipital infarction. Mild diffuse cortical atrophy. Mild chronic ischemic white matter disease. No acute intracranial abnormality seen. Electronically Signed   By: Marijo Conception, M.D.   On: 05/12/2017 16:18   Ct Cervical Spine Wo Contrast  Result Date: 05/13/2017 CLINICAL DATA:  Two falls in 2 days. EXAM: CT CERVICAL SPINE WITHOUT CONTRAST TECHNIQUE: Multidetector CT imaging of the cervical spine was performed without intravenous contrast. Multiplanar CT image reconstructions were also generated. COMPARISON:  CT cervical spine November 25, 2016 FINDINGS: ALIGNMENT: Straightened cervical lordosis. Stable grade 1 (4 mm) C3-4 anterolisthesis. SKULL BASE AND  VERTEBRAE: Cervical vertebral bodies and posterior elements are intact. Stable severe C4-5 through C6-7 disc height loss with endplate sclerosis and marginal spurring compatible with degenerative discs. C1-2 articulation maintained with severe arthropathy. Small amount of calcified pannus about the odontoid process seen with CPPD. Calcified longus coli insertion associated with calcific tendinopathy. Severe LEFT upper cervical facet arthropathy. Probable bone island RIGHT C7 facet. SOFT TISSUES AND SPINAL CANAL: Included view of the brain demonstrates old large LEFT PCA territory infarct. Severe calcific atherosclerosis carotid bifurcations. DISC LEVELS: Mild canal stenosis C5-6. Severe LEFT C3-4, moderate to severe bilateral C4-5, severe bilateral C5-6 and C6-7 neural foraminal narrowing. UPPER CHEST: Lung apices are  clear.  RIGHT cardiac pacemaker. OTHER: None. IMPRESSION: 1. No acute fracture. Stable grade 1 C3-4 anterolisthesis on degenerative basis. 2. Mild canal stenosis C5-6. Severe neural foraminal narrowing C3-4, C5-6 and C6-7. 3. Severe ICA atherosclerosis with suspected hemodynamically significant stenosis which could be quantified with CTA NECK on nonemergent basis. Electronically Signed   By: Elon Alas M.D.   On: 05/13/2017 00:48    Cardiac Studies   Echo 05/14/17:  Study Conclusions  - Left ventricle: The cavity size was normal. There was severe   focal basal hypertrophy of the septum with otherwise mild   concentric hypertrophy. Systolic function was normal. The   estimated ejection fraction was in the range of 55% to 60%. Wall   motion was normal; there were no regional wall motion   abnormalities. Doppler parameters are consistent with high   ventricular filling pressure. - Aortic valve: A TAVR bioprosthesis was present and functioning   properly. Transvalvular velocity was within the normal range.   There was no stenosis. There was trivial regurgitation. Valve   area  (VTI): 1.85 cm^2. Valve area (Vmax): 1.59 cm^2. Valve area   (Vmean): 1.7 cm^2. - Mitral valve: Calcified annulus. The findings are consistent with   mild stenosis. There was no regurgitation. Valve area by pressure   half-time: 1.48 cm^2. Valve area by continuity equation (using   LVOT flow): 1.36 cm^2. - Left atrium: The atrium was severely dilated. - Right ventricle: The cavity size was normal. Wall thickness was   normal. Systolic function was normal. - Atrial septum: No defect or patent foramen ovale was identified. - Tricuspid valve: There was moderate regurgitation. - Pulmonary arteries: Systolic pressure was mildly increased. PA   peak pressure: 40 mm Hg (S).  Carotid Doppler 05/14/17:  Carotid duplex prelim: Right 40-59% ICA stenosis. Left occluded ICA. Landry Mellow, RDMS, RVT Unchanged from previous study 08/24/16   Patient Profile     Mr. Towell is a 26M well-known to me with severe aortic stenosis s/p TAVR, ESRD on HD, hypotension on midodrine, diabetes mellitus, hyperlipidemia, carotid artery disease with complete occlusion of the L carotid, prior TIA and paroxysmal atrial fibrillation here with syncope.   Assessment & Plan    # Syncope: # Orthostatic hypotension:  No clear etiology for syncope this time.  Blood pressure has been normal and he is not orthostatic.  He tolerated hemodialysis without complication.  Echo revealed normally functioning aortic valve and normal systolic pressure.  Carotid Dopplers are unchanged from prior.  He has moderate disease on the right and a chronically occluded left ICA.  He is stable for discharge from a cardiac standpoint.  I discussed this with him and his wife.  Continue Midodrin with dialysis.  # s/p TAVR: Bioprosthetic aortic valve is functioning well.  # Hyperlipidemia: LDL elevated after stopping atorvastatin.  Will try rosuvastatin 5mg  qhs.  Repeat lipids and CMP in 6 weeks.   We will arrange hospital follow up and contact the  patient.    For questions or updates, please contact [place] Please consult [email or web address] for contact info under Cardiology/STEMI.      Signed, Skeet Latch, MD  05/14/2017, 10:16 AM

## 2017-05-14 NOTE — Discharge Summary (Signed)
Physician Discharge Summary  Alexander [name] AJO:[id number] DOB: 05/02/1940 DOA: 05/12/2017  PCP: Deland Pretty, MD  Admit date: 05/12/2017 Discharge date: 05/14/2017  Time spent: 35 minutes  Recommendations for Outpatient Follow-up:  1. Patient will need to be met pacemaker interrogation and adjustment of EDW as an outpatient 2. Patient should have meds adjusted by cardiology as an outpatient given his propensity for some dizziness and has been instructed to get up slowly 3. We will order home health for him as an outpatient  Discharge Diagnoses:  Principal Problem:   Near syncope Active Problems:   Anemia, unspecified   ESRD (end stage renal disease) on dialysis (HCC)   Persistent atrial fibrillation (HCC)   S/P TAVR (transcatheter aortic valve replacement)   Orthostatic hypotension   Macular degeneration   PVD (posterior vitreous detachment)   Elevated troponin   Discharge Condition: Improved  Diet recommendation: Renal heart healthy  Filed Weights   05/13/17 0558 05/13/17 1600 05/13/17 2015  Weight: 88.7 kg (195 lb 8.8 oz) 88.6 kg (195 lb 5.2 oz) 87.5 kg (192 lb 14.4 oz)    History of present illness:  89 male A. fib chads score >3 on Coumadin +amiodarone,bradycardia status post [facility] PPM 12 14/60,   08/2016 CVA 2004,  diabetes mellitus + neuropathy,  emphysema former prior smoker quit 1999,  macular degeneration  ESRD Monday Wednesday Friday at [facility],  reflux,  bradycardia,  carotid disease-occlusion left carotid,  legal blindness secondary to macular degeneration,  arthritis on low-dose prednisone-?  Pseudogout?  Carpal tunnel  streptococcal bacteremia presumed secondary to dialysis access 9/18-other workup at that time negative Seen in cardiology office 03/15/17-reports dropping of blood pressure and started taking Midodrin Monday Wednesday Friday prior to dialysis-was mentioned to take an additional 15 of Midrin if blood pressure drops-recently  discontinued off Cardizem  Admit 2/14 with fall at dental office without LOC-EKG A. fib with LBBB troponin positive    Hospital Course:  Syncope-monitor on telemetry and check orthostatic vital signs and ask therapy to see - continue Midodrine 10 mg Monday Wednesday Friday extra dosing as needed--cardiology nephrologist outpatient, PPM interrogated, carotid duplex is repeated with no change from prior 07/2016 and felt overall to be related to hypotension  A. fib chads score >3 on Coumadin +amiodarone,bradycardia status post [facility] PPM 12 14/60-complicated by syncope--okay to continue anticoagulation metoprolol 50 twice daily for rate control, Cardizem discontinued on admission  08/2016 CVA 2004-on Coumadin currently so no addition of aspirin especially in light of falls  diabetes mellitus + neuropathy-is on chronic low-dose prednisone  emphysema former prior smoker quit 1999-follows with Dr. Rolla Etienne of Pulmonary-will need OP discussion re: the same  macular degeneration legal blindness-probably a contributor to some of his difficulty moving around  ESRD Monday Wednesday Friday at [employer] to consult and will need dialysis might need to adjust EDW--EDW has dropped since last year from 200s 2 195   reflux-continue PPI  carotid disease-occlusion left carotid,  legal blindness secondary to macular degeneration,   arthritis on low-dose prednisone-?  Pseudogout?  Carpal tunnel   streptococcal bacteremia presumed secondary to dialysis access 9/18-other workup at that time negative    Procedures:  Carotid duplex Performed 2/16 Final Interpretation: Right Carotid: Velocities in the right ICA are consistent with a 40-59%        stenosis.  Left Carotid: Velocities in the left ICA are consistent with a total occlusion.   Echocardiogram Study Conclusions  - Left ventricle: The cavity size  was normal. There was severe   focal basal  hypertrophy of the septum with otherwise mild   concentric hypertrophy. Systolic function was normal. The   estimated ejection fraction was in the range of 55% to 60%. Wall   motion was normal; there were no regional wall motion   abnormalities. Doppler parameters are consistent with high   ventricular filling pressure. - Aortic valve: A TAVR bioprosthesis was present and functioning   properly. Transvalvular velocity was within the normal range.   There was no stenosis. There was trivial regurgitation. Valve   area (VTI): 1.85 cm^2. Valve area (Vmax): 1.59 cm^2. Valve area   (Vmean): 1.7 cm^2. - Mitral valve: Calcified annulus. The findings are consistent with   mild stenosis. There was no regurgitation. Valve area by pressure   half-time: 1.48 cm^2. Valve area by continuity equation (using   LVOT flow): 1.36 cm^2. - Left atrium: The atrium was severely dilated. - Right ventricle: The cavity size was normal. Wall thickness was   normal. Systolic function was normal. - Atrial septum: No defect or patent foramen ovale was identified. - Tricuspid valve: There was moderate regurgitation. - Pulmonary arteries: Systolic pressure was mildly increased. PA   peak pressure: 40 mm Hg (S).   Pacemaker interrogation   Consultations:  Cardiology  EP  Nephrology  Discharge Exam: Vitals:   05/13/17 2053 05/14/17 0623  BP: (!) 119/53 101/72  Pulse: 72 74  Resp: 18 18  Temp: 98.1 F (36.7 C) 98.2 F (36.8 C)  SpO2: 99% 97%    General: Awake alert pleasant no distress eating drinking no nausea vomiting Cardiovascular: S1-S2 no murmur rub or gallop Respiratory: Clinically clear no added sounds Abdomen soft nontender no rebound no guarding  Discharge Instructions    Allergies as of 05/14/2017      Reactions   Penicillins Swelling, Rash   Has patient had a PCN reaction causing immediate rash, facial/tongue/throat swelling, SOB or lightheadedness with hypotension: Yes Has patient  had a PCN reaction causing severe rash involving mucus membranes or skin necrosis: No Has patient had a PCN reaction that required hospitalization: No Has patient had a PCN reaction occurring within the last 10 years: No If all of the above answers are "NO", then may proceed with Cephalosporin use.   Codeine Nausea Only   Tramadol Nausea Only      Medication List    TAKE these medications   acetaminophen 325 MG tablet Commonly known as:  TYLENOL Take 2 tablets (650 mg total) by mouth every 6 (six) hours as needed for mild pain. What changed:  when to take this   atorvastatin 40 MG tablet Commonly known as:  LIPITOR Take 20 mg by mouth daily.   b complex-vitamin c-folic acid 0.8 MG Tabs tablet Take 1 tablet by mouth daily.   bromocriptine 5 MG capsule Commonly known as:  PARLODEL Take 5 mg by mouth at bedtime.   calcium acetate 667 MG capsule Commonly known as:  PHOSLO Take 2,668 mg by mouth 4 (four) times daily.   cetirizine 10 MG tablet Commonly known as:  ZYRTEC Take 10 mg by mouth at bedtime.   cinacalcet 30 MG tablet Commonly known as:  SENSIPAR Take 30 mg by mouth every evening.   clindamycin 150 MG capsule Commonly known as:  CLEOCIN Take 2,000 mg by mouth See admin instructions. Take 2,000 mg by mouth one hour prior to dental procedure   digoxin 0.125 MG tablet Commonly known as:  LANOXIN 1/2  TABLET BY MOUTH DAILY What changed:    how much to take  how to take this  when to take this  additional instructions   dorzolamide-timolol 22.3-6.8 MG/ML ophthalmic solution Commonly known as:  COSOPT Place 1 drop into both eyes 2 (two) times daily.   methocarbamol 500 MG tablet Commonly known as:  ROBAXIN Take 1 tablet (500 mg total) by mouth every 6 (six) hours as needed for muscle spasms.   metoprolol tartrate 50 MG tablet Commonly known as:  LOPRESSOR Take 1 tablet (50 mg total) by mouth 2 (two) times daily.   midodrine 10 MG tablet Commonly known  as:  PROAMATINE Take 10 mg by mouth See admin instructions. 10 mg by mouth prior to dialysis and an extra 5 mg as needed for low b/p prior to dialysis (may also take 15 mg as needed for low b/p during dialysis)   midodrine 5 MG tablet Commonly known as:  PROAMATINE Take 5-15 mg by mouth daily as needed (for a sudden drop in blood pressure).   NEPRO Liqd Take 237 mLs by mouth every Monday, Wednesday, and Friday with hemodialysis.   omeprazole 20 MG capsule Commonly known as:  PRILOSEC Take 20 mg by mouth daily.   polyethylene glycol packet Commonly known as:  MIRALAX / GLYCOLAX Take 17 g by mouth daily. What changed:    when to take this  reasons to take this   predniSONE 5 MG tablet Commonly known as:  DELTASONE Take 5 mg by mouth at bedtime.   senna-docusate 8.6-50 MG tablet Commonly known as:  Senokot-S Take 1 tablet by mouth at bedtime. What changed:    when to take this  reasons to take this   sertraline 50 MG tablet Commonly known as:  ZOLOFT Take 50 mg by mouth daily.   Tiotropium Bromide-Olodaterol 2.5-2.5 MCG/ACT Aers Commonly known as:  STIOLTO RESPIMAT Inhale 2 puffs into the lungs daily. What changed:  when to take this   warfarin 5 MG tablet Commonly known as:  COUMADIN Take as directed. If you are unsure how to take this medication, talk to your nurse or doctor. Original instructions:  Take 1-1.5 tablets by mouth daily as directed by coumadin clinic What changed:    how much to take  how to take this  when to take this  additional instructions      Allergies  Allergen Reactions  . Penicillins Swelling and Rash    Has patient had a PCN reaction causing immediate rash, facial/tongue/throat swelling, SOB or lightheadedness with hypotension: Yes Has patient had a PCN reaction causing severe rash involving mucus membranes or skin necrosis: No Has patient had a PCN reaction that required hospitalization: No Has patient had a PCN reaction  occurring within the last 10 years: No If all of the above answers are "NO", then may proceed with Cephalosporin use.   . Codeine Nausea Only  . Tramadol Nausea Only   Follow-up Information    [facility] Follow up.   Specialty:  [place] Why:  will call you to schedule visit Contact information: [address] [place] [address] [phone]            The results of significant diagnostics from this hospitalization (including imaging, microbiology, ancillary and laboratory) are listed below for reference.    Significant Diagnostic Studies:  Microbiology: Recent Results (from the past 240 hour(s))  MRSA PCR Screening     Status: None   Collection Time:  05/13/17  3:06 AM  Result Value Ref Range Status   MRSA by PCR NEGATIVE NEGATIVE Final    Comment:        The GeneXpert MRSA Assay (FDA approved for NASAL specimens only), is one component of a comprehensive MRSA colonization surveillance program. It is not intended to diagnose MRSA infection nor to guide or monitor treatment for MRSA infections. Performed at [facility], [place] [address], [place], [place] [address]      Labs: Basic Metabolic Panel: Recent Labs  Lab 05/12/17 1516 05/13/17 0051 05/13/17 0611  NA 138  --  141  K 3.5  --  3.9  CL 95*  --  96*  CO2 29  --  26  GLUCOSE 80  --  113*  BUN 25*  --  33*  CREATININE 6.20*  --  7.58*  CALCIUM 9.7  --  9.7  MG  --  1.8  --    Liver Function Tests: No results for input(s): AST, ALT, ALKPHOS, BILITOT, PROT, ALBUMIN in the last 168 hours. No results for input(s): LIPASE, AMYLASE in the last 168 hours. No results for input(s): AMMONIA in the last 168 hours. CBC: Recent Labs  Lab 05/12/17 1516 05/13/17 0611  WBC 10.9* 9.3  NEUTROABS 8.0*  --   HGB 11.6* 11.5*  HCT 36.7* 36.6*  MCV 101.1* 101.9*  PLT 187 150   Cardiac Enzymes: Recent Labs  Lab 05/13/17 0051 05/13/17 0611 05/13/17 1221  TROPONINI 0.09*  0.09* 0.08*   BNP: BNP (last 3 results) No results for input(s): BNP in the last 8760 hours.  ProBNP (last 3 results) Recent Labs    11/11/16 1143  PROBNP 18,028*    CBG: Recent Labs  Lab 05/13/17 0748 05/13/17 1200 05/13/17 2145 05/14/17 0800 05/14/17 1157  GLUCAP 94 99 80 117* 130*       Signed:  Nita Sells MD   [facility] 05/14/2017, 12:04 PM

## 2017-05-14 NOTE — Progress Notes (Signed)
Carotid duplex prelim: Right 40-59% ICA stenosis. Left occluded ICA. Landry Mellow, RDMS, RVT Unchanged from previous study 08/24/16.

## 2017-05-14 NOTE — Progress Notes (Signed)
[facility] Progress Note   Subjective:  Feels good today. Has been OOB. Denies new episodes of dizziness, syncope.  Carotid duplex unchanged from previous studies Normal EF on Echo s/p TAVR normal prosthetic valve function Normal function PPM interrogation  HD last night with no issues   Objective Vitals:   05/13/17 2015 05/13/17 2018 05/13/17 2053 05/14/17 0623  BP: 128/66 94/84 (!) 119/53 101/72  Pulse: 70 61 72 74  Resp: 19  18 18   Temp: 98 F (36.7 C)  98.1 F (36.7 C) 98.2 F (36.8 C)  TempSrc:   Oral Oral  SpO2:   99% 97%  Weight: 87.5 kg (192 lb 14.4 oz)     Height:       Physical Exam General: WNWD elderly male NAD Heart: RRR Lungs: CTAB Abdomen: soft NT Extremities: no LE edema  Dialysis Access: LUE AVF +bruit   Dialysis Orders:  HD orders: [facility] MWF 4 hrs 180 NRe 450/Auto 1.5 87.5 kg 2.0 K/2.0 Ca Linear Sodium -Heparin 2800 units IV TIW -Mircera 50 mcg IV q 2 weeks (last dose 05/02/17) -Hectorol 1 mcg IV TIW    Assessment/Plan: 1. Syncope - w/u per primary/cards following. Likely related to hypotension  2. AS s/p TAVR - Normal prosthetic valve function on Echo. 3. S/p PPM - device interrogation normal  4. Afib - chronic Coumadin  5. Carotid stenosis - L chronically occluded. No change on duplex today  6. ESRD - MWF. Next HD Monday 2/18  7. Anemia - Hgb 11.5. No ESA needs currently.  8. MBD- Ca ok. Continue VDRA/binders/Sensipar  9. HTN/volume - Chronic hypotension. On midodrine for BP support during HD.   Lynnda Child PA-C [facility] Pager [phone] 05/14/2017,10:34 AM  LOS: 1 day   Additional Objective Labs: Basic Metabolic Panel: Recent Labs  Lab 05/12/17 1516 05/13/17 0611  NA 138 141  K 3.5 3.9  CL 95* 96*  CO2 29 26  GLUCOSE 80 113*  BUN 25* 33*  CREATININE 6.20* 7.58*  CALCIUM 9.7 9.7   CBC: Recent Labs  Lab 05/12/17 1516 05/13/17 0611  WBC 10.9* 9.3  NEUTROABS 8.0*  --   HGB  11.6* 11.5*  HCT 36.7* 36.6*  MCV 101.1* 101.9*  PLT 187 150   Blood Culture    Component Value Date/Time   SDES BLOOD RIGHT WRIST 11/25/2016 1018   SDES BLOOD RIGHT HAND 11/25/2016 1018   SPECREQUEST  11/25/2016 1018    BOTTLES DRAWN AEROBIC AND ANAEROBIC Blood Culture adequate volume   SPECREQUEST  11/25/2016 1018    BOTTLES DRAWN AEROBIC AND ANAEROBIC Blood Culture adequate volume   CULT NO GROWTH 5 DAYS 11/25/2016 1018   CULT NO GROWTH 5 DAYS 11/25/2016 1018   REPTSTATUS 11/30/2016 FINAL 11/25/2016 1018   REPTSTATUS 11/30/2016 FINAL 11/25/2016 1018    Cardiac Enzymes: Recent Labs  Lab 05/13/17 0051 05/13/17 0611 05/13/17 1221  TROPONINI 0.09* 0.09* 0.08*   CBG: Recent Labs  Lab 05/12/17 2210 05/13/17 0748 05/13/17 1200 05/13/17 2145 05/14/17 0800  GLUCAP 81 94 99 80 117*   Iron Studies: No results for input(s): IRON, TIBC, TRANSFERRIN, FERRITIN in the last 72 hours. Lab Results  Component Value Date   INR 2.05 05/14/2017   INR 2.10 05/13/2017   INR 2.09 05/12/2017   Medications:  . bromocriptine  5 mg Oral QHS  . calcium acetate  2,668 mg Oral TID WC & HS  . cinacalcet  30 mg Oral QPM  . digoxin  0.0625 mg Oral Daily  . dorzolamide-timolol  1 drop Both Eyes BID  . doxercalciferol  1 mcg Intravenous Q M,W,F-HD  . feeding supplement (NEPRO CARB STEADY)  237 mL Oral Q M,W,F  . insulin aspart  0-9 Units Subcutaneous TID WC  . loratadine  10 mg Oral Daily  . metoprolol tartrate  50 mg Oral BID  . midodrine  10 mg Oral Q M,W,F-HD  . multivitamin  1 tablet Oral QHS  . pantoprazole  40 mg Oral Daily  . polyethylene glycol  17 g Oral Daily  . predniSONE  5 mg Oral QHS  . sertraline  50 mg Oral Daily  . warfarin  2.5 mg Oral Q M,W,F-1800  . warfarin  5 mg Oral Q T,Th,S,Su-1800  . Warfarin - Pharmacist Dosing Inpatient   Does not apply [phone]

## 2017-05-15 DIAGNOSIS — D631 Anemia in chronic kidney disease: Secondary | ICD-10-CM | POA: Diagnosis not present

## 2017-05-15 DIAGNOSIS — I4891 Unspecified atrial fibrillation: Secondary | ICD-10-CM | POA: Diagnosis not present

## 2017-05-15 DIAGNOSIS — N186 End stage renal disease: Secondary | ICD-10-CM | POA: Diagnosis not present

## 2017-05-15 DIAGNOSIS — I509 Heart failure, unspecified: Secondary | ICD-10-CM | POA: Diagnosis not present

## 2017-05-15 DIAGNOSIS — F329 Major depressive disorder, single episode, unspecified: Secondary | ICD-10-CM | POA: Diagnosis not present

## 2017-05-15 DIAGNOSIS — M6281 Muscle weakness (generalized): Secondary | ICD-10-CM | POA: Diagnosis not present

## 2017-05-15 LAB — CULTURE, GROUP A STREP (THRC)

## 2017-05-16 ENCOUNTER — Ambulatory Visit: Payer: Self-pay | Admitting: *Deleted

## 2017-05-16 ENCOUNTER — Other Ambulatory Visit: Payer: Self-pay | Admitting: *Deleted

## 2017-05-16 ENCOUNTER — Encounter: Payer: Self-pay | Admitting: *Deleted

## 2017-05-16 DIAGNOSIS — D631 Anemia in chronic kidney disease: Secondary | ICD-10-CM | POA: Diagnosis not present

## 2017-05-16 DIAGNOSIS — N2581 Secondary hyperparathyroidism of renal origin: Secondary | ICD-10-CM | POA: Diagnosis not present

## 2017-05-16 DIAGNOSIS — N186 End stage renal disease: Secondary | ICD-10-CM | POA: Diagnosis not present

## 2017-05-16 DIAGNOSIS — E1129 Type 2 diabetes mellitus with other diabetic kidney complication: Secondary | ICD-10-CM | POA: Diagnosis not present

## 2017-05-16 NOTE — Patient Outreach (Signed)
[facility]) Care Management [place] Telephone Outreach, Transition of Care attempt #1  05/16/2017  ESTEVEN OVERFELT 07/13/1940 [id number]  Unsuccessful telephone outreach to [name], daughter/ caregiver, on [facility] CM written consent, ofFred C Leonardis a 77 y.o.maleoriginallyreferred to [place] from [facility] telephonic RN CM on original MD referral. Patient has history including, but not limited to, PAD, Aortic stenosis with TAVR 08/31/16, ESRD on HD (M, W, F), A-Fib on coumadin, DM, CVA in 2004, CHF, macular degeneration with legal blindness, and multiple falls. Patient presented to hospital 11/23/16 for chest pain and general malaise,sepsis, and A-fib with RVR, and was discharged to SNF for rehabilitation.Patient was subsequently discharged home from [facility] March 11, 2017 to self-care without home health services in place.  Noted from review of EMR that patient experienced new hospital admission February 14-16, 2019 for near-syncope/ fall/ minor head injury; patient was discharged home to self-care with home health services in place.  [name] answered her phone for call outreach attempt, HIPAA/ identity verified; today, [name] immediately reports that she is unable to talk with me today, as she is driving and is unable to talk.  Caregiver requested that I attempt call again tomorrow, which I agreed to do.   Plan:  [place] continue withscheduledtelephone callattempt tomorrow for re-initiation of transition of care.  [name], RN, BSN, [employer] [facility] Care Management  [phone]

## 2017-05-17 ENCOUNTER — Other Ambulatory Visit: Payer: Self-pay | Admitting: *Deleted

## 2017-05-17 ENCOUNTER — Encounter: Payer: Self-pay | Admitting: *Deleted

## 2017-05-17 DIAGNOSIS — I4891 Unspecified atrial fibrillation: Secondary | ICD-10-CM | POA: Diagnosis not present

## 2017-05-17 DIAGNOSIS — D631 Anemia in chronic kidney disease: Secondary | ICD-10-CM | POA: Diagnosis not present

## 2017-05-17 DIAGNOSIS — F329 Major depressive disorder, single episode, unspecified: Secondary | ICD-10-CM | POA: Diagnosis not present

## 2017-05-17 DIAGNOSIS — I509 Heart failure, unspecified: Secondary | ICD-10-CM | POA: Diagnosis not present

## 2017-05-17 DIAGNOSIS — M6281 Muscle weakness (generalized): Secondary | ICD-10-CM | POA: Diagnosis not present

## 2017-05-17 DIAGNOSIS — N186 End stage renal disease: Secondary | ICD-10-CM | POA: Diagnosis not present

## 2017-05-17 NOTE — Patient Outreach (Signed)
[facility]) Care Management [facility] CM Telephone Outreach, Transition of Care day 1  05/17/2017  Alexander [name] 13-Jul-1940 [id number]  09:30:  Unsuccessful telephone outreach to Alexander [name], daughter/ caregiver, on [facility] CM written consent, ofFred C [name] a 77 y.o.maleoriginallyreferred to [place] from [facility] telephonic RN CM on original MD referral. Patient has history including, but not limited to, PAD, Aortic stenosis with TAVR 08/31/16, ESRD on HD (M, W, F), A-Fib on coumadin, DM, CVA in 2004, CHF, macular degeneration with legal blindness, and multiple falls. Patient presented to hospital 11/23/16 for chest pain and general malaise,sepsis, and A-fib with RVR, and was discharged to SNF for rehabilitation.Patient was subsequently discharged home from [facility] March 11, 2017 to self-care without home health services in place.  Noted from review of EMR that patient experienced new hospital admission February 14-16, 2019 for near-syncope/ fall/ minor head injury; patient was discharged home to self-care with home health services in place.  HIPAA compliant voice mail message left for patient's caregiver, requesting return call back.  09:40:  Patient's caregiver/ daughter immediately returned my call; HIPAA/ identity verified with patient's daughter today.  Today, patient's daughter reports that "things are going pretty good," after patient's recent hospitalization.  Alexander [name] states that patient is in no obvious/ apparent distress, and that his previously reported hip/leg pain "seems to be a little better now that he has been taken off of Atorvastatin."    Caregiver further reports:  Medications: -- Has all medicationsand takes as prescribed;denies questions about current medications; reports that she is currently out of town, and does not have list of patient's medications with her for review; discussed importance of completing prompt medication  review post-hospital discharge, and we agreed that this would be completed next week during [place] phone call.  -- patient's caregivers (daughter Alexander [name] and wife Alexander [name]) manage his medications using weekly pill planner box; patient is able to take medications independently once pills are in pill box. -- denies that patient has issues with swallowing medications  Home health [facility]) services: -- [place] services for PT, RN, aide in place through [facility]; confirms that she/ patient have phone number for home health agency -- confirms that [facility] services have begun; reports nurse visited on Sunday 05/15/17; PT to visit patient today   Provider appointments: -- All upcoming provider appointments were reviewed with patient's caregiver today -- reports patient will attend coumadin clinic appointment tomorrow; wife to transport -- reports no scheduled appointment with PCP yet, post-recent hospital discharge; encouraged caregiver to promptly make post-hospital discharge appointment with PCP; caregiver agrees to do so -- appointment with new pain management provider has been scheduled for Tuesday May 24, 2017; wife to transport -- cardiology appointment scheduled June 14, 2017; Alexander [name] reports that cardiology provider visited with patient during hospital stay, and at that time, did not recommend sooner appointment.  Encouraged caregiver to discuss need for sooner appointment with cardiology provider with PCP, once that appointment is scheduled, and she agrees to do so.  Also encouraged caregiver to maintain contact with all providers for any new concerns/ issues/ problems that arise.  Safety/ Mobility/ Falls: -- denies new falls post-hospital discharge -- assistive devices: continues using walker for all ambulation -- general fall risks/ prevention education reiterated with patient's caregiver today  Social/ Community Resource needs: -- continues to deny community resource needs, stating supportive  family members that assist with care needs as indicated -- continues using [facility] to transport to HD appointments  on M-W-F; wife picks patient up from HD sessions -- family otherwise provides transportation for patient to all provider appointments, errands, etc  Self-health management of chronic A-Fib/ high fall risk, recent syncopal episodes: -- caregiver reports that patient has not been aware of any new/ unusual Atrial fib with RVR episodes; states that patient is "always" in AF, and "doesn't even know it." -- states that during recent syncopal episodes patient had been raising his arms above his head prior to both episodes; once, while reaching for something out of a cabinet, once while closing the car trunk.  States that patient's carotid's were evaluated during hospital visit, but had not changed "since the last time they checked them."  Encouraged caregiver to report this finding aorund patient's syncopal episodes to all of his providers, and she agrees to do so, stating that she made cardiology provider aware of this while patient was hospitalized. -- continues attending coumadin clinic as scheduled -- discussed with/ reiterated with caregiver signs/symptoms to report/ seek emergent/ urgent care for, including shortness of breath, rapid heart palpitations, chest pain, falls; caregiver verbalizes good understanding of signs/ symptoms to immediately report, should they occur  Patient's caregiver denies further issues, concerns, or problems today.  I confirmed that patient has my direct phone number, the main [facility] CM office phone number, and the [facility] CM 24-hour nurse advice phone number should issues arise prior to next scheduled [place] outreach by phone next week.  Plan:  Patient will take medications as prescribed and will attend all scheduled provider appointments  Patient will continue using assistive devices for fall prevention  Patient will schedule PCP office visit promptly for  hospital discharge follow up  Patient will actively participate in home health services as ordered post-hospital discharge  [place] CMoutreachfor transition of care to continue withscheduledtelephone callnext week  [facility] CM Care Plan Problem One     Most Recent Value  Care Plan Problem One  Risk for hospital readmission related to recent hospitalization Feburary 13-16, 2019 for syncope, as evidenced by multiple recent hospitalizations  Role Documenting the Problem One  Care Management [place] for Problem One  Active  [facility] Long Term Goal   Over the next 31 days, patient will not experience hospital readmission, as evidenced by patient and caregiver reporting and review of EMR during [facility] RN CCM outreach  [facility] Long Term Goal Start Date  05/17/17  Interventions for Problem One Long Term Goal  TOC call placed,  [facility] CM TOC program initiated,  discussed patient's current clinical status with caregiver,  encouraged caregiver to make prompt hospital discharge follow up appointment with PCP,  discussed home health services in place,  scheduled [facility] CM home visit  [facility] CM Short Term Goal #1   Over the next 11 days, patient or his caregiver will schedule hospital discharge office visit with patient's PCP, as evidenced by patient/ caregiver reporting during [facility] RN CCM outreach  [facility] CM Short Term Goal #1 Start Date  05/17/17  Interventions for Short Term Goal #1  Discussed importance of prompt PCP follow up post-hospital discharge with patient's caregiver,  encouraged caregiver to make prompt appointment with patient's PCP    [facility] CM Care Plan Problem Two     Most Recent Value  Care Plan Problem Two  Ongoing management of chronic pain, as previously evidenced by patient reporting, and confirmation of same today by caregiver reporting  Role Documenting the Problem Two  Care Management Coordinator  Care  Plan for Problem Two  Active  [facility] CM Short Term Goal #2   Over the next 30  days, patient will actively participate with [facility] PT, as evidenced by patient and caregiver reporting during [facility] RN CCM outreach  [facility] CM Short Term Goal #2 Start Date  04/27/17  Interventions for Short Term Goal #2  Confirmed with patient's daughter/ caregiver that [facility] PT continues visiting patient at home and that patient is actively participating,  encouraged caregiver to make [facility] PT aware of patient's recent hospitalization and syncopal episodes   [facility] CM Short Term Goal #3   Over the next 30 day, patient will attend pain management provider office visit, as evidenced by patient/ caregiver reporting and review of EMR during [facility] RN CCM outreach  [facility] CM Short Term Goal #3 Start Date  04/27/17  Interventions for Short Term Goal #3  Confirmed with patient's caregiver/ daughter that patient has heard from new pain management provider and has scheduled/ has transportation to, appointment next week 05/24/17     [name], RN, BSN, [place] Coordinator [facility] Care Management  [phone]

## 2017-05-18 DIAGNOSIS — N2581 Secondary hyperparathyroidism of renal origin: Secondary | ICD-10-CM | POA: Diagnosis not present

## 2017-05-18 DIAGNOSIS — E1129 Type 2 diabetes mellitus with other diabetic kidney complication: Secondary | ICD-10-CM | POA: Diagnosis not present

## 2017-05-18 DIAGNOSIS — N186 End stage renal disease: Secondary | ICD-10-CM | POA: Diagnosis not present

## 2017-05-18 DIAGNOSIS — D631 Anemia in chronic kidney disease: Secondary | ICD-10-CM | POA: Diagnosis not present

## 2017-05-19 ENCOUNTER — Ambulatory Visit: Payer: Self-pay | Admitting: *Deleted

## 2017-05-19 DIAGNOSIS — F329 Major depressive disorder, single episode, unspecified: Secondary | ICD-10-CM | POA: Diagnosis not present

## 2017-05-19 DIAGNOSIS — N186 End stage renal disease: Secondary | ICD-10-CM | POA: Diagnosis not present

## 2017-05-19 DIAGNOSIS — M6281 Muscle weakness (generalized): Secondary | ICD-10-CM | POA: Diagnosis not present

## 2017-05-19 DIAGNOSIS — I4891 Unspecified atrial fibrillation: Secondary | ICD-10-CM | POA: Diagnosis not present

## 2017-05-19 DIAGNOSIS — D631 Anemia in chronic kidney disease: Secondary | ICD-10-CM | POA: Diagnosis not present

## 2017-05-19 DIAGNOSIS — I509 Heart failure, unspecified: Secondary | ICD-10-CM | POA: Diagnosis not present

## 2017-05-20 DIAGNOSIS — N2581 Secondary hyperparathyroidism of renal origin: Secondary | ICD-10-CM | POA: Diagnosis not present

## 2017-05-20 DIAGNOSIS — M6281 Muscle weakness (generalized): Secondary | ICD-10-CM | POA: Diagnosis not present

## 2017-05-20 DIAGNOSIS — E1129 Type 2 diabetes mellitus with other diabetic kidney complication: Secondary | ICD-10-CM | POA: Diagnosis not present

## 2017-05-20 DIAGNOSIS — D631 Anemia in chronic kidney disease: Secondary | ICD-10-CM | POA: Diagnosis not present

## 2017-05-20 DIAGNOSIS — I509 Heart failure, unspecified: Secondary | ICD-10-CM | POA: Diagnosis not present

## 2017-05-20 DIAGNOSIS — N186 End stage renal disease: Secondary | ICD-10-CM | POA: Diagnosis not present

## 2017-05-23 ENCOUNTER — Telehealth: Payer: Self-pay | Admitting: Cardiovascular Disease

## 2017-05-23 DIAGNOSIS — N186 End stage renal disease: Secondary | ICD-10-CM | POA: Diagnosis not present

## 2017-05-23 DIAGNOSIS — D631 Anemia in chronic kidney disease: Secondary | ICD-10-CM | POA: Diagnosis not present

## 2017-05-23 DIAGNOSIS — E1129 Type 2 diabetes mellitus with other diabetic kidney complication: Secondary | ICD-10-CM | POA: Diagnosis not present

## 2017-05-23 DIAGNOSIS — N2581 Secondary hyperparathyroidism of renal origin: Secondary | ICD-10-CM | POA: Diagnosis not present

## 2017-05-23 MED ORDER — CLINDAMYCIN HCL 150 MG PO CAPS: 2000.0000 mg | ORAL_CAPSULE | ORAL | 2 refills | Status: DC

## 2017-05-23 MED ORDER — DIGOXIN 125 MCG PO TABS: 0.0625 mg | ORAL_TABLET | Freq: Every day | ORAL | 3 refills | Status: DC

## 2017-05-23 NOTE — Telephone Encounter (Signed)
New Message    *STAT* If patient is at the pharmacy, call can be transferred to refill team.   1. Which medications need to be refilled? (please list name of each medication and dose if known) digoxin (LANOXIN) 0.125 MG tablet  2. Which pharmacy/location (including street and city if local pharmacy) is medication to be sent to? [facility] [address]   3. Do they need a 30 day or 90 day supply? 30  Patients wife is calling stating that a new rx needs to be sent. Dr, Oval Linsey is now wanting her husband to take one tablet a day.

## 2017-05-24 ENCOUNTER — Other Ambulatory Visit: Payer: Self-pay | Admitting: *Deleted

## 2017-05-24 DIAGNOSIS — D631 Anemia in chronic kidney disease: Secondary | ICD-10-CM | POA: Diagnosis not present

## 2017-05-24 DIAGNOSIS — I509 Heart failure, unspecified: Secondary | ICD-10-CM | POA: Diagnosis not present

## 2017-05-24 DIAGNOSIS — N186 End stage renal disease: Secondary | ICD-10-CM | POA: Diagnosis not present

## 2017-05-24 DIAGNOSIS — M6281 Muscle weakness (generalized): Secondary | ICD-10-CM | POA: Diagnosis not present

## 2017-05-24 DIAGNOSIS — M545 Low back pain: Secondary | ICD-10-CM | POA: Diagnosis not present

## 2017-05-24 DIAGNOSIS — G894 Chronic pain syndrome: Secondary | ICD-10-CM | POA: Diagnosis not present

## 2017-05-24 DIAGNOSIS — M79604 Pain in right leg: Secondary | ICD-10-CM | POA: Diagnosis not present

## 2017-05-24 DIAGNOSIS — F329 Major depressive disorder, single episode, unspecified: Secondary | ICD-10-CM | POA: Diagnosis not present

## 2017-05-24 DIAGNOSIS — I4891 Unspecified atrial fibrillation: Secondary | ICD-10-CM | POA: Diagnosis not present

## 2017-05-25 DIAGNOSIS — E1129 Type 2 diabetes mellitus with other diabetic kidney complication: Secondary | ICD-10-CM | POA: Diagnosis not present

## 2017-05-25 DIAGNOSIS — D631 Anemia in chronic kidney disease: Secondary | ICD-10-CM | POA: Diagnosis not present

## 2017-05-25 DIAGNOSIS — I482 Chronic atrial fibrillation: Secondary | ICD-10-CM | POA: Diagnosis not present

## 2017-05-25 DIAGNOSIS — N2581 Secondary hyperparathyroidism of renal origin: Secondary | ICD-10-CM | POA: Diagnosis not present

## 2017-05-25 DIAGNOSIS — N186 End stage renal disease: Secondary | ICD-10-CM | POA: Diagnosis not present

## 2017-05-25 LAB — PROTIME-INR: INR: 3.5 — AB (ref 0.9–1.1)

## 2017-05-26 ENCOUNTER — Other Ambulatory Visit: Payer: Self-pay | Admitting: Cardiovascular Disease

## 2017-05-26 DIAGNOSIS — D631 Anemia in chronic kidney disease: Secondary | ICD-10-CM | POA: Diagnosis not present

## 2017-05-26 DIAGNOSIS — I509 Heart failure, unspecified: Secondary | ICD-10-CM | POA: Diagnosis not present

## 2017-05-26 DIAGNOSIS — M6281 Muscle weakness (generalized): Secondary | ICD-10-CM | POA: Diagnosis not present

## 2017-05-26 DIAGNOSIS — I4891 Unspecified atrial fibrillation: Secondary | ICD-10-CM | POA: Diagnosis not present

## 2017-05-26 DIAGNOSIS — F329 Major depressive disorder, single episode, unspecified: Secondary | ICD-10-CM | POA: Diagnosis not present

## 2017-05-26 DIAGNOSIS — N186 End stage renal disease: Secondary | ICD-10-CM | POA: Diagnosis not present

## 2017-05-26 MED ORDER — DIGOXIN 125 MCG PO TABS: 0.0625 mg | ORAL_TABLET | Freq: Every day | ORAL | 3 refills | Status: DC

## 2017-05-26 NOTE — Telephone Encounter (Signed)
Medicated refilled to requested pharmacy

## 2017-05-26 NOTE — Telephone Encounter (Signed)
New Message   Pt c/o medication issue:  1. Name of Medication: digoxin (LANOXIN) 0.125 MG tablet  2. How are you currently taking this medication (dosage and times per day)? Take 0.5 tablets (0.0625 mg total) by mouth daily  3. Are you having a reaction (difficulty breathing--STAT)? no  4. What is your medication issue? Pt's wife is calling about the refill request, says it was sent to the wrong pharmacy and the insurance only covers [facility] [place] [place] [address]

## 2017-05-27 ENCOUNTER — Ambulatory Visit (INDEPENDENT_AMBULATORY_CARE_PROVIDER_SITE_OTHER): Payer: Medicare Other | Admitting: Pharmacist

## 2017-05-27 ENCOUNTER — Other Ambulatory Visit: Payer: Self-pay | Admitting: *Deleted

## 2017-05-27 DIAGNOSIS — Z7901 Long term (current) use of anticoagulants: Secondary | ICD-10-CM | POA: Diagnosis not present

## 2017-05-27 DIAGNOSIS — N186 End stage renal disease: Secondary | ICD-10-CM | POA: Diagnosis not present

## 2017-05-27 DIAGNOSIS — N2581 Secondary hyperparathyroidism of renal origin: Secondary | ICD-10-CM | POA: Diagnosis not present

## 2017-05-27 DIAGNOSIS — Z992 Dependence on renal dialysis: Secondary | ICD-10-CM | POA: Diagnosis not present

## 2017-05-27 DIAGNOSIS — D631 Anemia in chronic kidney disease: Secondary | ICD-10-CM | POA: Diagnosis not present

## 2017-05-27 DIAGNOSIS — E1129 Type 2 diabetes mellitus with other diabetic kidney complication: Secondary | ICD-10-CM | POA: Diagnosis not present

## 2017-05-27 NOTE — Patient Outreach (Signed)
[name]) Care Management [place] Telephone Outreach, Transition of Care day 11  05/27/2017  Alexander [name] 29-Apr-1940 [id number]  Successful telephone outreach to Alexander [name], daughter/ caregiver, on [facility] CM written consent, ofFred OMARIO [name], 77 y.o.maleoriginallyreferred to [place] from [facility] telephonic RN CM on original MD referral. Patient has history including, but not limited to, PAD, Aortic stenosis with TAVR 08/31/16, ESRD on HD (M, W, F), A-Fib on coumadin, DM, CVA in 2004, CHF, macular degeneration with legal blindness, and multiple falls. Patient presented to hospital 11/23/16 for chest pain and general malaise,sepsis, and A-fib with RVR, and was discharged to SNF for rehabilitation.Patient was subsequently discharged home from [facility] March 11, 2017 to self-care without home health services in place.  Patient experienced new hospital admission February 14-16, 2019 for near-syncope/ fall/ minor head injury; patient was discharged home to self-care with home health services in place.  HIPAA/ identity verified with patient's daughter.  Today, patient's daughter reports that "things are holding steady," but that she is "very frustrated," as patient has been "pushing back," with her being caregiver; stating that patient "doesn't want to do anything" she tells/ advises him to.  Reports that patient has told her that she "is not the one in control of him."  Reports that patient has refused to schedule PCP office visit, post-most recent hospital discharge and that patient has told her he "doesn't need to see the doctors" because he was recently evaluated by his doctors while in the hospital.  Caregiver verbalizes frustration in attempts to care for patient while also feeling that he pushes her away; emotional support and encouragement provided to caregiver; encouraged caregiver to not personalize her feelings and reminded her that while she can  encourage patient to do what he needs to, only he can actually do it.  Alexander [name] states that patient is in no obvious/ apparent distress, and that his previously reported hip/leg pain "is essentially unchanged."  Reports no new falls, concerns, issues; reports that she believes patient is "over-doing" his activities at home, and verbalizes that she fears patient will fall due to over-doing.  Caregiver further reports:  Medications: -- Has all medicationsand takes as prescribed;denies questions/ concerns/ recent changes around current medications;  -- again discussed importance of completing prompt medication review post-hospital discharge, and we agreed today that this would be completed next week during [place] initial home visit, with in-home medication review.  -- patient's caregivers (daughter Alexander [name] and wife Alexander [name]) continue managing patient's medications using weekly pill planner box; patient is able to take medications independently once pills are in pill box.  Home health [facility]) services: -- [place] services for PT, RN, continue in place through [facility];  -- reports all [facility] disciplines visiting patient twice weekly around established HD sessions  Provider appointments: -- All upcoming recent/ provider appointments were reviewed with patient's caregiver today -- as noted above, reports no scheduled appointment with PCP yet, stating patient is refusing to schedule/ attend PCP office visit -- attended appointment with new pain management provider Tuesday May 24, 2017; reports next steps is for patient to have radiology testing around ongoing hip/ leg pain -- cardiology appointment scheduled June 14, 2017; Alexander [name] reports that patient is agreeable to attending this appointment -- encouraged caregiver to maintain contact with all providers for any new concerns/ issues/ problems that arise.  Safety/ Mobility/ Falls: -- denies new/ recent falls post-hospital discharge -- assistive  devices: continues using walker for all ambulation --  general fall risks/ prevention education reiterated with patient's caregiver today; emotional support/ encouragement provided to caregiver around her reports that she believes patient puts himself at risk for possible falls  Self-health management of chronic A-Fib/ high fall risk, recent syncopal episodes: -- caregiver reports no changes in A-Fib status; denies that patient has been aware of any new/ unusual Atrial fib with RVR episodes; denies concerning signs/ symptoms, including increased shortness of breath, dizziness, palpitations -- continues attending coumadin clinic as scheduled; reports adherence to coumadin clinic visits/ dosing guidelines -- discussed with/ reiterated with caregiver signs/symptoms to report/ seek emergent/ urgent care for, including shortness of breath, rapid heart palpitations, chest pain, falls; caregiver verbalizes good understanding of signs/ symptoms to immediately report, should they occur  Patient's caregiver denies further issues, concerns, or problems today. I confirmed that patient hasmy direct phone number, the main [facility] CM office phone number, and the [facility] CM 24-hour nurse advice phone number should issues arise prior to next scheduled [place] outreach with home visit next week.  Plan:  Patient will take medications as prescribed and will attend all scheduled provider appointments  Patient will continue using assistive devices for fall prevention  Patient will actively participate in home health services as ordered post-hospital discharge  [place] CMoutreachfor transition of care to continue withscheduledhome visitnext week  [facility] CM Care Plan Problem One     Most Recent Value  Care Plan Problem One  Risk for hospital readmission related to recent hospitalization Feburary 13-16, 2019 for syncope, as evidenced by multiple recent hospitalizations  Role Documenting the Problem  One  Care Management [place] for Problem One  Active  [facility] Long Term Goal   Over the next 31 days, patient will not experience hospital readmission, as evidenced by patient and caregiver reporting and review of EMR during [facility] RN CCM outreach  [facility] Long Term Goal Start Date  05/17/17  Interventions for Problem One Long Term Goal  Discussed patient's current clinical status with caregiver,  encouraged caregiver around frustration in providing care for patient,  confirmed [facility] RN CM home visit for next week  [facility] CM Short Term Goal #1   Over the next 11 days, patient or his caregiver will schedule hospital discharge office visit with patient's PCP, as evidenced by patient/ caregiver reporting during [facility] RN CCM outreach  [facility] CM Short Term Goal #1 Start Date  05/17/17  [facility] CM Short Term Goal #1 Met Date  05/27/17- Goal not met; caregiver reports patient refusing PCP office visit post-recent hospital discharge    [facility] CM Care Plan Problem Two     Most Recent Value  Care Plan Problem Two  Ongoing management of chronic pain, as previously evidenced by patient reporting, and confirmation of same today by caregiver reporting  Role Documenting the Problem Two  Care Management [place] for Problem Two  Active  [facility] CM Short Term Goal #1   Over the next 30 days, patient will attend scheduled radiology appointments post-pain management provider appointment, as evidenced by patient/ caregiver reporting and review of EMR during [facility] RN CCM outreach  [facility] CM Short Term Goal #1 Start Date  05/27/17  Interventions for Short Term Goal #2   Discussed next steps in plan of care with caregiver, post-pain management provider visit earlier this week  [facility] CM Short Term Goal #2   Over the next 30 days, patient will actively participate with [facility] PT, as evidenced by patient and caregiver  reporting during [place] outreach  [facility] CM Short Term Goal #2 Start Date  04/27/17  Interventions for Short Term Goal  #2  Confirmed with patient's daughter/ caregiver that [facility] PT continues visiting patient at home and that patient is actively participating, and is using assistive devices with ambulation,  confirmed that patient has not had ongoing dizziness, or recent falls  [facility] CM Short Term Goal #3   Over the next 30 day, patient will attend pain management provider office visit, as evidenced by patient/ caregiver reporting and review of EMR during [facility] RN CCM outreach  [facility] CM Short Term Goal #3 Start Date  04/27/17  [facility] CM Short Term Goal #3 Met Date  05/27/17 -Goal met     [name], RN, BSN, [place] Coordinator [facility] Care Management  [phone]

## 2017-05-30 DIAGNOSIS — E1129 Type 2 diabetes mellitus with other diabetic kidney complication: Secondary | ICD-10-CM | POA: Diagnosis not present

## 2017-05-30 DIAGNOSIS — N2581 Secondary hyperparathyroidism of renal origin: Secondary | ICD-10-CM | POA: Diagnosis not present

## 2017-05-30 DIAGNOSIS — N186 End stage renal disease: Secondary | ICD-10-CM | POA: Diagnosis not present

## 2017-05-30 DIAGNOSIS — D631 Anemia in chronic kidney disease: Secondary | ICD-10-CM | POA: Diagnosis not present

## 2017-05-30 LAB — CUP PACEART REMOTE DEVICE CHECK
Battery Remaining Percentage: 95.5 %
Battery Voltage: 2.99 V
Brady Statistic AP VP Percent: 0 %
Brady Statistic AS VS Percent: 23 %
Brady Statistic RA Percent Paced: 1 %
Brady Statistic RV Percent Paced: 18 %
Date Time Interrogation Session: 20190205080441
Implantable Lead Implant Date: 20161213
Implantable Lead Implant Date: 20161213
Implantable Lead Location: 753859
Implantable Pulse Generator Implant Date: 20161213
Lead Channel Impedance Value: 440 Ohm
Lead Channel Pacing Threshold Amplitude: 0.75 V
Lead Channel Pacing Threshold Pulse Width: 0.5 ms
Lead Channel Sensing Intrinsic Amplitude: 1 mV
Lead Channel Setting Sensing Sensitivity: 2 mV
MDC IDC LEAD LOCATION: 753860
MDC IDC MSMT BATTERY REMAINING LONGEVITY: 114 mo
MDC IDC MSMT LEADCHNL RA IMPEDANCE VALUE: 340 Ohm
MDC IDC MSMT LEADCHNL RV PACING THRESHOLD AMPLITUDE: 0.75 V
MDC IDC MSMT LEADCHNL RV PACING THRESHOLD PULSEWIDTH: 0.5 ms
MDC IDC MSMT LEADCHNL RV SENSING INTR AMPL: 9.5 mV
MDC IDC PG SERIAL: 7839215
MDC IDC SET LEADCHNL RA PACING AMPLITUDE: 1.75 V
MDC IDC SET LEADCHNL RV PACING AMPLITUDE: 2.5 V
MDC IDC SET LEADCHNL RV PACING PULSEWIDTH: 0.5 ms
MDC IDC STAT BRADY AP VS PERCENT: 0 %
MDC IDC STAT BRADY AS VP PERCENT: 77 %

## 2017-05-31 DIAGNOSIS — D631 Anemia in chronic kidney disease: Secondary | ICD-10-CM | POA: Diagnosis not present

## 2017-05-31 DIAGNOSIS — N186 End stage renal disease: Secondary | ICD-10-CM | POA: Diagnosis not present

## 2017-05-31 DIAGNOSIS — F329 Major depressive disorder, single episode, unspecified: Secondary | ICD-10-CM | POA: Diagnosis not present

## 2017-05-31 DIAGNOSIS — I509 Heart failure, unspecified: Secondary | ICD-10-CM | POA: Diagnosis not present

## 2017-05-31 DIAGNOSIS — I4891 Unspecified atrial fibrillation: Secondary | ICD-10-CM | POA: Diagnosis not present

## 2017-05-31 DIAGNOSIS — M6281 Muscle weakness (generalized): Secondary | ICD-10-CM | POA: Diagnosis not present

## 2017-06-01 ENCOUNTER — Ambulatory Visit
Admission: RE | Admit: 2017-06-01 | Discharge: 2017-06-01 | Disposition: A | Discharge status: Home / Self Care | Payer: Medicare Other | Source: Ambulatory Visit | Attending: Physician Assistant | Admitting: Physician Assistant

## 2017-06-01 ENCOUNTER — Other Ambulatory Visit: Payer: Self-pay | Admitting: Physician Assistant

## 2017-06-01 DIAGNOSIS — M545 Low back pain, unspecified: Secondary | ICD-10-CM

## 2017-06-01 DIAGNOSIS — N2581 Secondary hyperparathyroidism of renal origin: Secondary | ICD-10-CM | POA: Diagnosis not present

## 2017-06-01 DIAGNOSIS — E1129 Type 2 diabetes mellitus with other diabetic kidney complication: Secondary | ICD-10-CM | POA: Diagnosis not present

## 2017-06-01 DIAGNOSIS — D631 Anemia in chronic kidney disease: Secondary | ICD-10-CM | POA: Diagnosis not present

## 2017-06-01 DIAGNOSIS — M5136 Other intervertebral disc degeneration, lumbar region: Secondary | ICD-10-CM | POA: Diagnosis not present

## 2017-06-01 DIAGNOSIS — N186 End stage renal disease: Secondary | ICD-10-CM | POA: Diagnosis not present

## 2017-06-02 ENCOUNTER — Other Ambulatory Visit: Payer: Self-pay | Admitting: *Deleted

## 2017-06-02 ENCOUNTER — Encounter: Payer: Self-pay | Admitting: *Deleted

## 2017-06-02 DIAGNOSIS — E789 Disorder of lipoprotein metabolism, unspecified: Secondary | ICD-10-CM | POA: Diagnosis not present

## 2017-06-02 DIAGNOSIS — I509 Heart failure, unspecified: Secondary | ICD-10-CM | POA: Diagnosis not present

## 2017-06-02 DIAGNOSIS — I10 Essential (primary) hypertension: Secondary | ICD-10-CM | POA: Diagnosis not present

## 2017-06-02 DIAGNOSIS — I4891 Unspecified atrial fibrillation: Secondary | ICD-10-CM | POA: Diagnosis not present

## 2017-06-02 DIAGNOSIS — M6281 Muscle weakness (generalized): Secondary | ICD-10-CM | POA: Diagnosis not present

## 2017-06-02 DIAGNOSIS — Z09 Encounter for follow-up examination after completed treatment for conditions other than malignant neoplasm: Secondary | ICD-10-CM | POA: Diagnosis not present

## 2017-06-02 DIAGNOSIS — F329 Major depressive disorder, single episode, unspecified: Secondary | ICD-10-CM | POA: Diagnosis not present

## 2017-06-02 DIAGNOSIS — N186 End stage renal disease: Secondary | ICD-10-CM | POA: Diagnosis not present

## 2017-06-02 DIAGNOSIS — D631 Anemia in chronic kidney disease: Secondary | ICD-10-CM | POA: Diagnosis not present

## 2017-06-02 NOTE — Patient Outreach (Signed)
[place] [name]) Care Management  [facility] CM Routine Home Visit, Transition of Care day 17  06/02/2017  Alexander [name] Aug 22, 1940 [id number]  [name] is an 77 y.o. male originallyreferred to [place] from [name] telephonic RN CM on original MD referral. Patient has history including, but not limited to, PAD, Aortic stenosis with TAVR 08/31/16, ESRD on HD (M, W, F), A-Fib on coumadin, DM, CVA in 2004, CHF, macular degeneration with legal blindness, and multiple falls. Patient presented to [name] 11/23/16 for chest pain and general malaise,sepsis, and A-fib with RVR, and was discharged to SNF for rehabilitation.Patient was subsequently discharged home from [name] March 11, 2017 to self-care without home health services in place.  Patient experienced new [name] admission February 14-16, 2019 for near-syncope/ fall/ minor head injury; patient was discharged home to self-care with home health services in place.  HIPAA/ identity verified with patient in person today, and patient's daughter and wife are both present for home visit and actively participate in all aspects of visit.  Pleasant 90 minute home visit.  Today, patient reports that he is "doing very good," and he is in no distress throughout entirety of home visit.    Subjective:  "I have really been trying to tell myself to go slow, to move slowly, and to not over-do; this isn't easy on days I feel good."  Assessment:  Patient appears to be recuperating well after his recent [name] visit for syncope.  Patient remains adherent to his overall care plan and has a very supportive family that actively participates in his daily care, as indicated.  Patient is legally blind, but is able to complete most of his daily care without assistance.  Patient is frustrated with limitations to his activity, as he has always been very active.  Patient continues using assistive devices for ambulation and actively participating  in home health services.  Patient could benefit from ongoing reinforcement of education around his chronic health conditions.  Patient/ caregivers further report:  Medications: -- Has all medicationsand takes as prescribed;denies questions/ concerns/ recent changes around current medications -- verbalize good general understanding of purpose, dosing, and scheduling of prescribed medications -- patient's wife currently fills weekly pill planner box for patient; patient takes independently from pill box once filled -- caregiver questions possibility of having blister packaging in place for patient; this was discussed, and patient and caregivers would like to consider options before making any changes, as patient's medications are frequently changed around his condition; discussed possibility of placing [name] referral if patient decides blister packaging would be beneficial.  Also discussed possibility of having patient's daughter assist with weekly pill box filling, as patient's wife has had recent chemo/ radiation, and although she reports she is comfortable filling pill box, she verbalizes concern that she "may not always feel comfortable."  Daughter is willing to assist with pill box filling. -- Patient was recently discharged from [name] and all medications were throughly reviewed with patient/ caregivers today during home visit  Home health [facility]) services: -- [name] services for PT,RN, continue in place through [facility]; both disciplines visiting twice weekly for now -- patient actively participating in [name] services  Provider appointments: -- attended PCP visit today for "medication review with pharmacist."  Reports was told that PCP does not need to see patient post-[name] discharge, but that NP at PCP office visit "may want to schedule an appointment;" reports this is "pending -- All upcoming recent/ provider appointments were reviewed  with patient and caregiver'stoday --  cardiology appointment scheduled June 14, 2017 -- encouraged caregiver to maintain contact with all providers for any new concerns/ issues/ problems that arise. -- patient has continued attending HD sessions as scheduled -- has completed first steps of radiology testing around ongoing hip/ leg pain; reports to attend next "nerve conductions testing" later in March 2019 -- wife/ family continues transporting patient to provider appointments, patient uses [facility] for transportation to HD sessions, and wife picks him up from HD sessions  Safety/ Mobility/ Falls: -- denies new/ recent fallspost-[name] discharge -- assistive devices:continues using rolling walker for all ambulation -- general fall risks/ prevention educationreiterated with patient and caregiverstoday; all verbalize good understanding of fall prevention practices/ strategies  Self-health management ofchronic A-Fib/ high fall risk, recent syncopal episodes: --patient/ caregivers reports no changes in A-Fib status; patient denies that he has had any changes/ been aware of any new/ unusual Atrial fib with RVR episodes; denies concerning signs/ symptoms, including increased shortness of breath, dizziness, palpitations --continues attending coumadin clinic as scheduled; reports adherence to coumadin clinic visits/ dosing guidelines -- discussed with/ reiterated with caregiver signs/symptoms to report/ seek emergent/ urgent care for, including shortness of breath, rapid heart palpitations, chest pain, falls; caregiver verbalizes good understanding of signs/ symptoms to immediately report, should they occur -- discussed at length using teachback method and provided/ reviewed printed EMMI educational materials around A-Fib self-health management and syncope; encouraged all to review  -- discussed at length using teachback method importance of moving slowly and deliberately; changing positions slowly/ deliberately; discussed possibility of  vasovagal effects in syncope, importance of notifying care providers for any episodes of dizziness and /or syncope -- patient declines need/ desire to monitor BP at home, stating that his BP is monitored carefully during HD sessions and with home health visits; discussed with patient and caregivers possible need/ value of beginning home BP monitoring if his dizziness recurs or he has syncope  Patient and his caregivers deny further issues, concerns, or problems today.  I confirmed that patient has my direct phone number, the main [facility] CM office phone number, and the [facility] CM 24-hour nurse advice phone number should issues arise prior to next scheduled [place] outreach.  Encouraged patient/ caregivers to contact me directly if needs, questions, issues, or concerns arise prior to next scheduled outreach; all agreed to do so.  Objective:    Resp 18   SpO2 98%  HR 70 Orthostatic BP's:  Sitting 142/62  Standing  130/64   Review of Systems  Constitutional: Negative.   Eyes:       "Legally blind for a long time" patient reports remote hx legal blindness  Respiratory: Negative.  Negative for cough, shortness of breath and wheezing.   Cardiovascular: Negative.  Negative for chest pain, palpitations and leg swelling.  Gastrointestinal: Negative.  Negative for abdominal pain and nausea.  Genitourinary: Negative.        On HD Mon-Wed-Fri  Musculoskeletal: Positive for joint pain. Negative for falls.       Ongoing/ chronic (R) hip/ leg pain  Neurological: Negative.   Psychiatric/Behavioral: Negative.  Negative for depression. The patient is not nervous/anxious.    Physical Exam  Constitutional: He is oriented to person, place, and time. He appears well-developed and well-nourished. No distress.    Cardiovascular: Normal rate, regular rhythm, normal heart sounds and intact distal pulses.  Pulses:      Radial pulses are 2+ on the right side, and 2+ on  the left side.  Respiratory: Effort  normal and breath sounds normal. No respiratory distress. He has no wheezes. He has no rales.  GI: Soft. Bowel sounds are normal.  Musculoskeletal: He exhibits no edema.  Neurological: He is alert and oriented to person, place, and time.  Skin: Skin is warm and dry.  Psychiatric: He has a normal mood and affect. His behavior is normal. Judgment and thought content normal.   Encounter Medications:   Outpatient Encounter Medications as of 06/02/2017  Medication Sig Note  . acetaminophen (TYLENOL) 325 MG tablet Take 2 tablets (650 mg total) by mouth every 6 (six) hours as needed for mild pain. (Patient taking differently: Take 650 mg by mouth every 6 (six) hours. )   . b complex-vitamin c-folic acid (NEPHRO-VITE) 0.8 MG TABS Take 1 tablet by mouth daily.    . bromocriptine (PARLODEL) 5 MG capsule Take 5 mg by mouth at bedtime.    . cetirizine (ZYRTEC) 10 MG tablet Take 10 mg by mouth at bedtime.    . cinacalcet (SENSIPAR) 30 MG tablet Take 30 mg by mouth every evening.    . digoxin (LANOXIN) 0.125 MG tablet Take 0.5 tablets (0.0625 mg total) by mouth daily.   . dorzolamide-timolol (COSOPT) 22.3-6.8 MG/ML ophthalmic solution Place 1 drop into both eyes 2 (two) times daily.    . ferric citrate (AURYXIA) 1 GM 210 MG(Fe) tablet Take 420 mg by mouth 3 (three) times daily with meals.   . metoprolol tartrate (LOPRESSOR) 50 MG tablet Take 1 tablet (50 mg total) by mouth 2 (two) times daily.   . midodrine (PROAMATINE) 10 MG tablet Take 10 mg by mouth See admin instructions. 10 mg by mouth prior to dialysis and an extra 5 mg as needed for low b/p prior to dialysis (may also take 15 mg as needed for low b/p during dialysis) 06/02/2017: Takes 10 mg po Mon-Wed-Fri with HD   . midodrine (PROAMATINE) 5 MG tablet Take 5-15 mg by mouth daily as needed (for a sudden drop in blood pressure).  06/02/2017: Takes as needed; reports has not needed recently   . Nutritional Supplements (NEPRO) LIQD Take 237 mLs by mouth every  Monday, Wednesday, and Friday with hemodialysis.    [name] omeprazole (PRILOSEC) 20 MG capsule Take 20 mg by mouth daily.   . predniSONE (DELTASONE) 5 MG tablet Take 5 mg by mouth at bedtime.    . senna-docusate (SENOKOT-S) 8.6-50 MG tablet Take 1 tablet by mouth at bedtime. (Patient taking differently: Take 1 tablet by mouth at bedtime as needed for mild constipation. )   . sertraline (ZOLOFT) 50 MG tablet Take 50 mg by mouth daily.   . Tiotropium Bromide-Olodaterol (STIOLTO RESPIMAT) 2.5-2.5 MCG/ACT AERS Inhale 2 puffs into the lungs daily. (Patient taking differently: Inhale 2 puffs into the lungs 2 (two) times daily. )   . warfarin (COUMADIN) 5 MG tablet Take 1-1.5 tablets by mouth daily as directed by coumadin clinic (Patient taking differently: Take 2.5-5 mg by mouth See admin instructions. 5 mg by mouth at night on Sun/Tues/Thurs/Sat and 2.5 mg on Mon/Wed/Fri) 05/12/2017: Regimen endorsed by the patient's daughter  . atorvastatin (LIPITOR) 40 MG tablet Take 20 mg by mouth daily. 05/12/2017: This is currently ON HOLD  . calcium acetate (PHOSLO) 667 MG capsule Take 2,668 mg by mouth 4 (four) times daily.  06/02/2017: Reports was discontinued by renal provider   . clindamycin (CLEOCIN) 150 MG capsule Take 13 capsules (1,950 mg total) by mouth See admin  instructions. Take 2,000 mg by mouth one hour prior to dental procedure (Patient not taking: Reported on 06/02/2017)   . methocarbamol (ROBAXIN) 500 MG tablet Take 1 tablet (500 mg total) by mouth every 6 (six) hours as needed for muscle spasms. (Patient not taking: Reported on 06/02/2017)   . polyethylene glycol (MIRALAX / GLYCOLAX) packet Take 17 g by mouth daily. (Patient not taking: Reported on 06/02/2017)    No facility-administered encounter medications on file as of 06/02/2017.    Functional Status:   In your present state of health, do you have any difficulty performing the following activities: 06/02/2017 05/12/2017  Hearing? N N  Vision? Y Y  Comment  Legal blindness legally blind both eyes  Difficulty concentrating or making decisions? N N  Walking or climbing stairs? Y Y  Comment - "gets tired"  Dressing or bathing? N N  Doing errands, shopping? Y Y  Comment - "he doesn't driveChief Executive Officer and eating ? - -  Comment - -  Using the Toilet? - -  In the past six months, have you accidently leaked urine? - -  Comment - -  Do you have problems with loss of bowel control? - -  Managing your Medications? - -  Comment - -  Managing your Finances? - -  Comment - -  Housekeeping or managing your Housekeeping? - -  Comment - -  Some recent data might be hidden   Fall/Depression Screening:    Fall Risk  06/02/2017 05/27/2017 05/17/2017  Falls in the past year? (No Data) (No Data) Yes  Comment Patient/ family denies new/ recent falls  no new/ recent falls, per report of daughter/ caregiver, on [name] CM written consent per daughter/ caregiver [name], on [facility] CM written consent; no new falls since [name] discharge 05/14/17  Number falls in past yr: - - 2 or more  Comment - - -  Injury with Fall? - - Yes  Risk Factor Category  - - High Fall Risk  Risk for fall due to : - - History of fall(s);Impaired mobility;Medication side effect  Follow up - - Falls prevention discussed   PHQ 2/9 Scores 04/07/2017 12/03/2016 10/26/2016 10/04/2016  PHQ - 2 Score 0 0 0 -  Exception Documentation - - - Other- indicate reason in comment box  Not completed - - - call completed with dtr   Plan:  Patient will take medications as prescribed and will attend all scheduled provider appointments  Patient will continue using assistive devices for fall prevention  Patient will actively participate in home healthservicesas ordered post-[name] discharge  Patient/ caregivers will review educational material provided to them today  I will share notes from today's [place] home visit with patient's PCP  [place] CMoutreachfor transition of careto  continue withscheduledtelephone outreachnext week   [name] CM Care Plan Problem One     Most Recent Value  Care Plan Problem One  Risk for [name] readmission related to recent hospitalization Feburary 13-16, 2019 for syncope, as evidenced by multiple recent hospitalizations  Role Documenting the Problem One  Care Management [place] for Problem One  Active  [facility] Long Term Goal   Over the next 31 days, patient will not experience [name] readmission, as evidenced by patient and caregiver reporting and review of EMR during [facility] RN CCM outreach  [name] Long Term Goal Start Date  05/17/17  Interventions for Problem One Long Term Goal  [facility] CM home visit with physical assessment,  medication review, and educational material provided    [name] CM Care Plan Problem Two     Most Recent Value  Care Plan Problem Two  Ongoing management of chronic pain, as previously evidenced by patient reporting, and confirmation of same today by caregiver reporting  Role Documenting the Problem Two  Care Management [place] for Problem Two  Active  [facility] CM Short Term Goal #1   Over the next 30 days, patient will attend scheduled radiology appointments post-pain management provider appointment, as evidenced by patient/ caregiver reporting and review of EMR during [facility] RN CCM outreach  [name] CM Short Term Goal #1 Start Date  05/27/17  Interventions for Short Term Goal #2   Discussed recently attended appointment for XR with pain management provider,  reviewed next steps in plan of care with patient and caregivers  [facility] CM Short Term Goal #2   Over the next 30 days, patient will actively participate with [name] PT, as evidenced by patient and caregiver reporting during [facility] RN CCM outreach  [name] CM Short Term Goal #2 Start Date  04/27/17  Interventions for Short Term Goal #2  Confirmed with patient and caregivers that [name] PT continues visiting patient at home twice weekly and that patient is actively  participating, and is using assistive devices with ambulation,  using teachback method, discussed fall prevention and risks with patient and caregivers    [name] CM Care Plan Problem Three     Most Recent Value  Care Plan Problem Three  Self-health management deficit of chronic disease state of A-Fib and recurrent syncope, as evidenced by patient/ caregiver reporting post- recent [name] visit  Role Documenting the Problem Three  Care Management [place] for Problem Three  Active  [facility] Long Term Goal   Over the next 50 days, patient/ caregiver will be able to verbalize signs/ symptoms of pre- syncope, low BP, along with action plan for same, as evidenced by patient/caregiver reporting during [facility] RN CCM outreach  [facility] Long Term Goal Start Date  06/02/17  Interventions for Problem Three Long Term Goal  Using teachback method, discussed with patient and caregivers signs/ symptoms syncope/ low blood pressure,  discussed patient's triggers/ understanding for/ of syncope/ low blood pressure.  Provided and thoroughly reviewed printed EMMI educational material with patient and caregivers and discussed basic action plan for prevention of syncope with patient and caregivers  [facility] CM Short Term Goal #1   Over the next 14 days, patient and caregivers will attend upcoming cardiology appointment and will discuss action plan for syncope with provider, as evidenced by patient/ caregiver reporting and review of EMR during [name] RN CCM outreach  [facility] CM Short Term Goal #1 Start Date  06/02/17  Interventions for Short Term Goal #1  Using teachback method, discussed with patient and caregivers importance of discussing recent syncopal episodes with cardiology provider,  emphasized importance of promptly notifying providers for any new pre- or syncopal episodes,  reviewed upcoming provider appointments with patient and caregivers     [name], RN, BSN, [place] Care  Management  [phone]

## 2017-06-03 DIAGNOSIS — E1129 Type 2 diabetes mellitus with other diabetic kidney complication: Secondary | ICD-10-CM | POA: Diagnosis not present

## 2017-06-03 DIAGNOSIS — D631 Anemia in chronic kidney disease: Secondary | ICD-10-CM | POA: Diagnosis not present

## 2017-06-03 DIAGNOSIS — N2581 Secondary hyperparathyroidism of renal origin: Secondary | ICD-10-CM | POA: Diagnosis not present

## 2017-06-03 DIAGNOSIS — N186 End stage renal disease: Secondary | ICD-10-CM | POA: Diagnosis not present

## 2017-06-06 DIAGNOSIS — N2581 Secondary hyperparathyroidism of renal origin: Secondary | ICD-10-CM | POA: Diagnosis not present

## 2017-06-06 DIAGNOSIS — E1129 Type 2 diabetes mellitus with other diabetic kidney complication: Secondary | ICD-10-CM | POA: Diagnosis not present

## 2017-06-06 DIAGNOSIS — D631 Anemia in chronic kidney disease: Secondary | ICD-10-CM | POA: Diagnosis not present

## 2017-06-06 DIAGNOSIS — N186 End stage renal disease: Secondary | ICD-10-CM | POA: Diagnosis not present

## 2017-06-07 DIAGNOSIS — F329 Major depressive disorder, single episode, unspecified: Secondary | ICD-10-CM | POA: Diagnosis not present

## 2017-06-07 DIAGNOSIS — I509 Heart failure, unspecified: Secondary | ICD-10-CM | POA: Diagnosis not present

## 2017-06-07 DIAGNOSIS — I4891 Unspecified atrial fibrillation: Secondary | ICD-10-CM | POA: Diagnosis not present

## 2017-06-07 DIAGNOSIS — N186 End stage renal disease: Secondary | ICD-10-CM | POA: Diagnosis not present

## 2017-06-07 DIAGNOSIS — D631 Anemia in chronic kidney disease: Secondary | ICD-10-CM | POA: Diagnosis not present

## 2017-06-07 DIAGNOSIS — M6281 Muscle weakness (generalized): Secondary | ICD-10-CM | POA: Diagnosis not present

## 2017-06-08 DIAGNOSIS — D631 Anemia in chronic kidney disease: Secondary | ICD-10-CM | POA: Diagnosis not present

## 2017-06-08 DIAGNOSIS — N186 End stage renal disease: Secondary | ICD-10-CM | POA: Diagnosis not present

## 2017-06-08 DIAGNOSIS — N2581 Secondary hyperparathyroidism of renal origin: Secondary | ICD-10-CM | POA: Diagnosis not present

## 2017-06-08 DIAGNOSIS — E1129 Type 2 diabetes mellitus with other diabetic kidney complication: Secondary | ICD-10-CM | POA: Diagnosis not present

## 2017-06-09 ENCOUNTER — Encounter: Payer: Self-pay | Admitting: *Deleted

## 2017-06-09 ENCOUNTER — Ambulatory Visit: Payer: Medicare Other | Admitting: *Deleted

## 2017-06-09 ENCOUNTER — Other Ambulatory Visit: Payer: Self-pay | Admitting: *Deleted

## 2017-06-09 DIAGNOSIS — M533 Sacrococcygeal disorders, not elsewhere classified: Secondary | ICD-10-CM | POA: Diagnosis not present

## 2017-06-09 DIAGNOSIS — I4891 Unspecified atrial fibrillation: Secondary | ICD-10-CM | POA: Diagnosis not present

## 2017-06-09 DIAGNOSIS — I509 Heart failure, unspecified: Secondary | ICD-10-CM | POA: Diagnosis not present

## 2017-06-09 DIAGNOSIS — N186 End stage renal disease: Secondary | ICD-10-CM | POA: Diagnosis not present

## 2017-06-09 DIAGNOSIS — I951 Orthostatic hypotension: Secondary | ICD-10-CM | POA: Diagnosis not present

## 2017-06-09 DIAGNOSIS — M6281 Muscle weakness (generalized): Secondary | ICD-10-CM | POA: Diagnosis not present

## 2017-06-09 DIAGNOSIS — F329 Major depressive disorder, single episode, unspecified: Secondary | ICD-10-CM | POA: Diagnosis not present

## 2017-06-09 DIAGNOSIS — D631 Anemia in chronic kidney disease: Secondary | ICD-10-CM | POA: Diagnosis not present

## 2017-06-09 DIAGNOSIS — R55 Syncope and collapse: Secondary | ICD-10-CM | POA: Diagnosis not present

## 2017-06-09 NOTE — Patient Outreach (Signed)
[facility]) Care Management [place] Telephone Outreach, Transition of Care day 24  06/09/2017  MILLAN LEGAN 1941/03/23 [id number]  Successful telephone outreach for trasition of care to [name], caregiver/ daughter, on [place] written consent, of BLAYKE CORDREY, 77 y.o. male originallyreferred to [place] from [facility] telephonic RN CM on original MD referral. Patient has history including, but not limited to, PAD, Aortic stenosis with TAVR 08/31/16, ESRD on HD (M, W, F), A-Fib on coumadin, DM, CVA in 2004, CHF, macular degeneration with legal blindness, and multiple falls. Patient presented to hospital 11/23/16 for chest pain and general malaise,sepsis, and A-fib with RVR, and was discharged to SNF for rehabilitation.Patient was subsequently discharged home from [facility] March 11, 2017 to self-care without home health services in place.  Patient experienced new hospital admission February 14-16, 2019 for near-syncope/ fall/ minor head injury; patient was discharged home to self-care with home health services in place.  HIPAA/ identity verified with patient's daughter today.  Today,caregiver reports that patient is "doing pretty good," and "things seem to be under control."  Caregiver reports that patient is not in distress, and she states that his pain "is about the same as it always is," and that patient has not reported any recent dizzy spells, shortness of breath, syncope, or recent falls.  However, caregiver reported that when the home health nurse visited with patient on Tuesday 06/07/17, she was very concerned because patient's BP was "dangerously low;" stated that the home health nurse contacted patient's PCP and scheduled visit with PCP for tomorrow, Friday 06/10/17; reports patient's spouse will transport him to appointment, as she Spero Geralds) is currently sick.  Caregiver reports that patient has had ongoing times that she feels he "over-does," and  she describes an incident this week when the patient went outside and got a lawn mower off the back of his truck without any assistance; caregiver reported he tolerated this without any incident/ problems, but acknowledges that she thinks he "got lucky" that he wasn't injured/ set back from this increased activity.  Caregiver further report:  -- no concerns/ changes around patient's medications; we had previously discussed option of daughter assisting patient's spouse with filling of his pill box; daughter has been unable to start assisting as of yet, stating that she has been sick and has not wanted to expose her family members to her illness; states that she has discussed this with patient's spouse (who has had recent chemo/ radiation), and that patient's spouse has verbalized that she "feels okay" about filling pill box.  Discussed options around resources available for assistance for caregiver review ([employer]); also discussed that if caregiver/ patient would like, we could schedule another home visit for reinforcement/ evaluation of patient's spouse understanding of filling pill box.  Caregiver assures me today that she believes patient's spouse is filling pill box correctly, and states she will consider options  -- home health services continue; patient has continued actively participating  -- provided/ discussed caregiver resources ([employer]) and encouraged caregiver to consider engaging with these resources   -- all provider appointments reviewed; caregiver confirms that family will continue transporting patient to appointments  Daughter/ caregiver denies further issues, concerns, or problems today, and states that she needs to get off phone, as she is at work. Confirmed that patient/ caregivers havemy direct phone number, the main [facility] CM office phone number, and the [facility] CM 24-hour nurse advice phone number should issues arise prior to next scheduled [facility]  [employer].  Encouraged patient/ caregivers to contact me directly if needs, questions, issues, or concerns arise prior to next scheduled outreach.  Plan:  Patient will take medications as prescribed and will attend all scheduled provider appointments  Patient will continue using assistive devices for fall prevention  Patient will actively participate in home healthservicesas ordered post-hospital discharge  Patient/ caregivers will continue reviewing previously provided educational material   Patient/ caregiver will promptly notify care providers for any new concerns, issues, or problems that arise  [facility] CMoutreachfor transition of careto continue withscheduledtelephone outreachnext week  [facility] CM Care Plan Problem One     Most Recent Value  Care Plan Problem One  Risk for hospital readmission related to recent hospitalization Feburary 13-16, 2019 for syncope, as evidenced by multiple recent hospitalizations  Role Documenting the Problem One  Care Management [place] for Problem One  Active  [facility] Long Term Goal   Over the next 31 days, patient will not experience hospital readmission, as evidenced by patient and caregiver reporting and review of EMR during [facility] RN CCM outreach  [facility] Long Term Goal Start Date  05/17/17  Interventions for Problem One Long Term Goal  TOC call placed,  discussed with patient's caregiver patient's current clinical status,  upcoming provider appointments,  discussed patient's recent HD sessions and medication management     [facility] CM Care Plan Problem Two     Most Recent Value  Care Plan Problem Two  Ongoing management of chronic pain, as previously evidenced by patient reporting, and confirmation of same today by caregiver reporting  Role Documenting the Problem Two  Care Management Coordinator  Care Plan for Problem Two  Active  [facility] CM Short Term Goal #1   Over the next 30 days, patient will attend scheduled radiology  appointments post-pain management provider appointment, as evidenced by patient/ caregiver reporting and review of EMR during [facility] RN CCM outreach  [facility] CM Short Term Goal #1 Start Date  05/27/17  Interventions for Short Term Goal #2   Unable to address with caregiver today during phone call,  as caregiver cites time restraints  [facility] CM Short Term Goal #2   Over the next 30 days, patient will actively participate with [facility] PT, as evidenced by patient and caregiver reporting during [facility] RN CCM outreach  [facility] CM Short Term Goal #2 Start Date  04/27/17  [facility] CM Short Term Goal #2 Met Date  06/09/17- Goal met    [facility] CM Care Plan Problem Three     Most Recent Value  Care Plan Problem Three  Self-health management deficit of chronic disease state of A-Fib and recurrent syncope, as evidenced by patient/ caregiver reporting post- recent hospital visit  Role Documenting the Problem Three  Care Management Coordinator  Care Plan for Problem Three  Active  [facility] Long Term Goal   Over the next 50 days, patient/ caregiver will be able to verbalize signs/ symptoms of pre- syncope, low BP, along with action plan for same, as evidenced by patient/caregiver reporting during [facility] RN CCM outreach  [facility] Long Term Goal Start Date  06/02/17  Interventions for Problem Three Long Term Goal  Discussed with patient's caregiver patient's recent isolated episode of low BP,  reinforced signs/ symptoms syncope/ low blood pressure,  confirmed that patient is planning to attend recently scheduled PCP office visit tomorrow around this recent isolated episode of low BP  [facility] CM Short Term Goal #1   Over the  next 14 days, patient and caregivers will attend upcoming cardiology appointment and will discuss action plan for syncope with provider, as evidenced by patient/ caregiver reporting and review of EMR during [facility] RN CCM outreach  [facility] CM Short Term Goal #1 Start Date  06/02/17  Interventions for Short Term Goal #1  Discussed with patient's  caregiver upcoming cardiology provider office visit and confirmed that patient will attend scheduled appointment     [name], RN, BSN, [place] Care Management  [phone]

## 2017-06-10 ENCOUNTER — Ambulatory Visit (INDEPENDENT_AMBULATORY_CARE_PROVIDER_SITE_OTHER): Payer: Medicare Other | Admitting: Pharmacist Clinician (PhC)/ Clinical Pharmacy Specialist

## 2017-06-10 ENCOUNTER — Encounter: Payer: Self-pay | Admitting: *Deleted

## 2017-06-10 DIAGNOSIS — D631 Anemia in chronic kidney disease: Secondary | ICD-10-CM | POA: Diagnosis not present

## 2017-06-10 DIAGNOSIS — N186 End stage renal disease: Secondary | ICD-10-CM | POA: Diagnosis not present

## 2017-06-10 DIAGNOSIS — E1129 Type 2 diabetes mellitus with other diabetic kidney complication: Secondary | ICD-10-CM | POA: Diagnosis not present

## 2017-06-10 DIAGNOSIS — I4891 Unspecified atrial fibrillation: Secondary | ICD-10-CM | POA: Diagnosis not present

## 2017-06-10 DIAGNOSIS — Z7901 Long term (current) use of anticoagulants: Secondary | ICD-10-CM

## 2017-06-10 DIAGNOSIS — N2581 Secondary hyperparathyroidism of renal origin: Secondary | ICD-10-CM | POA: Diagnosis not present

## 2017-06-10 LAB — POCT INR: INR: 2.2

## 2017-06-10 NOTE — Patient Instructions (Signed)
Description   Ccontinue with 1 tablet daily except 1/2 tablet each Monday, Wednesday and Friday.  Repeat INR in 2 week

## 2017-06-13 ENCOUNTER — Ambulatory Visit (INDEPENDENT_AMBULATORY_CARE_PROVIDER_SITE_OTHER): Payer: Medicare Other | Admitting: Cardiovascular Disease

## 2017-06-13 ENCOUNTER — Encounter: Payer: Self-pay | Admitting: Cardiovascular Disease

## 2017-06-13 VITALS — BP 104/54 | HR 72 | Ht 70.5 in | Wt 196.6 lb

## 2017-06-13 DIAGNOSIS — Z952 Presence of prosthetic heart valve: Secondary | ICD-10-CM

## 2017-06-13 DIAGNOSIS — E7800 Pure hypercholesterolemia, unspecified: Secondary | ICD-10-CM

## 2017-06-13 DIAGNOSIS — I951 Orthostatic hypotension: Secondary | ICD-10-CM

## 2017-06-13 DIAGNOSIS — N2581 Secondary hyperparathyroidism of renal origin: Secondary | ICD-10-CM | POA: Diagnosis not present

## 2017-06-13 DIAGNOSIS — I481 Persistent atrial fibrillation: Secondary | ICD-10-CM | POA: Diagnosis not present

## 2017-06-13 DIAGNOSIS — E1129 Type 2 diabetes mellitus with other diabetic kidney complication: Secondary | ICD-10-CM | POA: Diagnosis not present

## 2017-06-13 DIAGNOSIS — D631 Anemia in chronic kidney disease: Secondary | ICD-10-CM | POA: Diagnosis not present

## 2017-06-13 DIAGNOSIS — N186 End stage renal disease: Secondary | ICD-10-CM | POA: Diagnosis not present

## 2017-06-13 DIAGNOSIS — I495 Sick sinus syndrome: Secondary | ICD-10-CM | POA: Diagnosis not present

## 2017-06-13 DIAGNOSIS — I4811 Longstanding persistent atrial fibrillation: Secondary | ICD-10-CM

## 2017-06-13 MED ORDER — METOPROLOL TARTRATE 25 MG PO TABS: ORAL_TABLET | ORAL | 1 refills | Status: DC

## 2017-06-13 MED ORDER — PRAVASTATIN SODIUM 10 MG PO TABS: 10.0000 mg | ORAL_TABLET | Freq: Every day | ORAL | 1 refills | Status: DC

## 2017-06-13 NOTE — Patient Instructions (Addendum)
Medication Instructions:  DECREASE YOUR METOPROLOL TO 25 MG 1/2 TABLET TWICE  A DAY   START PRAVASTATIN 10 MG DAILY   Labwork: WILL CHECK FASTING LABS WHEN YOU RETURN FOR YOUR FOLLOW UP   Testing/Procedures: NONE  Follow-Up: Your physician recommends that you schedule a follow-up appointment in: 6 WEEKS   If you need a refill on your cardiac medications before your next appointment, please call your pharmacy.

## 2017-06-13 NOTE — Progress Notes (Signed)
Cardiology Office Note   Date:  06/13/2017   ID:  Alexander [name], DOB 30-Jun-1940, MRN [id number]  PCP:  Deland Pretty, MD  Cardiologist:   Skeet Latch, MD  Electrophysiologist: Allegra Lai, MD  No chief complaint on file.   History of Present Illness: Alexander [name] is a 77 y.o. male with severe aortic stenosis s/p TAVR, ESRD on HD, hypotension on midodrine, diabetes mellitus, hyperlipidemia, carotid artery disease with complete occlusion of the L carotid, prior TIA and paroxysmal atrial fibrillation  who presents for follow up.  He was initially evaluated on 12/04/14 with hypotension requiring midodrine and syncope.  He had an echo that revealed LVEF 60-65% with grade 2 diastolic dysfunction, moderate AS and mild MR. He wore a 48 hour Holter 11/2014 that revealed episodes of dizziness associated with heart rates in the 50s. He had a PPM implanted 01/2015.  He had a repeat monitor 12/2014 that showed atrial fibrillation with occasional junctional escape.  He subsequently had a Lexiscan Cardiolite 01/2015 that was negative for ischemia but showed an area of distal inferior infarct vs artifact.  He saw Dr. Curt Bears on 10/06/15 where he was found to have been in atrial fibrillation since April.  He went into sinus rhythm briefly after DCCV.  Alexander [name] had an echo 06/21/16 that revealed LVEF 60-65% and moderate aortic stenosis with a mean gradient of 33 mmHg.  He continued to complain of fatigue and dyspnea, so he had a TEE 07/22/16 that revealed normal systolic function and an aortic valve that appeared severely stenoitic, though gradients indicated moderate aortic stenosis.  Alexander [name] underwent placement of a 29 mm Sapien 3 TAVR on 08/2016.  He was subsequently admitted to [facility] with volume overload in the setting of confusion about midodrine and metoprolol  He inadvertently continue taking midodrine and stopped taking metoprolol.   He required extra dialysis. He subsequently  followed up with Dr. Angelena Form on 10/07/16 and was doing fairly well. He continued to struggle to get his dry weight regulated with dialysis.  Echo on 10/07/16 revealed LVEF 55-60% with mild LVH. The prostatic valve was functioning properly early without any significant stenosis or regurgitation. At his last appointment he reported significant fatigue and shortness of breath. He also reported orthopnea. He had a chest x-ray 11/11/16 that revealed mild cardiomegaly and tiny pleural effusions consistent with mild CHF. BNP was 18,000. I called his dialysis session and asked that they lower his dry weight to try using increase the amount of volume removed with each session.  Alexander [name] was admitted to the hospital 11/2016 with sepsis 2/2 Strep bacteremia.  The source was unclear, but presumed to be 2/2 HD access.  He had a CT of the spine that showed no evidence of discitis or abscess.  He also had a TEE 12/01/16 that revealed no vegetations on his PPM lead and normal TAVR valve function.  There was moderate TR and moderate MR. His hypotension resolved prior to discharge.  Alexander [name] continues to struggle with his blood pressure dropped dropping during hemodialysis.  His blood pressure starts out in the 400Q systolic and quickly drops to the 60s or 80s.  He takes 15 mg of Midrin when this happens which helps for a while.  His blood pressure then dropped again and he has to take another 50 mg prior to leaving dialysis.  He takes 10 mg when he first wakes up on dialysis days.  His sessions have been stopped early  several times due to the blood pressure dropping.   At his last appointment diltiazem was stopped and digoxin was added.  Since that appointment he was admitted for an episode of syncope.  It occurred when lifting something over his head and then again latera that day while walking to the dentist.  BP was not low and he had not been orthostatic.  He had carotid Dopplers that were unchanged from prior and (100% L  ICA stenosis 4-59% R).  No arrhythmias were noted on his ICD.  He hasn't had any recurrent syncope or falls.  Overall his BP continues to run low.  He started wearing compression socks 3 days ago.  Since last night his blood pressure has actually been running higher.  Last night his blood pressure was 150.  He took midodrine in the morning but did not require any extra doses during dialysis.  His blood pressure started out at around 120 and then dropped to 110s.  He is feeling much better.  He has not had any chest pain.  His breathing has been stable.  He denies lower extremity edema, orthopnea, or PND.  He notes that his PCP's office reduced metoprolol to 25mg  daily.     Past Medical History:  Diagnosis Date  . Anemia   . Aortic stenosis 06/15/12   TEE - EF 00-93%; grade 1 diastolic dysfunction; mild/mod aortic valve stenosis; Mitral valve had calcified annulus, mild pulm htn PA peak pressure 77mmHg  . Barrett's esophagus 05/2003  . Bradycardia 2017   [employer] 2240 Assurity dual-lead pacemaker  . Carpal tunnel syndrome, bilateral 11/03/2015  . Colon polyps   . CVA (cerebral infarction)    2004/affected left side  . Depression   . Diabetes mellitus without complication ([place])   . Diabetic peripheral neuropathy ([place]) 10/02/2015  . Diverticulosis   . Dyspnea    with exertion  . ESRD (end stage renal disease) on dialysis ([place])    "[facility]; [place]; MWF" (05/12/2017)  . GERD (gastroesophageal reflux disease)   . Glaucoma   . History of kidney stones   . Hyperlipidemia   . Hypertension   . Legally blind   . Macular degeneration    both eyes  . Orthostatic hypotension 09/09/2015  . Paroxysmal atrial fibrillation (HCC)   . Peptic ulcer    bleeding, 1969  . Presence of permanent cardiac pacemaker   . S/P epidural steroid injection    last  injection over 10 years ago  . Seasonal allergies   . Tubular adenoma of colon 07/2001    Past Surgical History:  Procedure Laterality Date  . AV  FISTULA PLACEMENT  2009  . BACK SURGERY    . CARDIOVERSION N/A 11/13/2015   Procedure: CARDIOVERSION;  Surgeon: Troy Sine, MD;  Location: [facility] ENDOSCOPY;  Service: Cardiovascular;  Laterality: N/A;  . CARDIOVERSION N/A 01/13/2016   Procedure: CARDIOVERSION;  Surgeon: Will Meredith [name], MD;  Location: [place];  Service: Cardiovascular;  Laterality: N/A;  . [place]   right eye  . CYSTOSCOPY  several times   kidney stones  . EP IMPLANTABLE DEVICE N/A 03/11/2015   Procedure: Pacemaker Implant;  Surgeon: Will Meredith [name], MD;  [place];  Laterality: Left  . LAMINECTOMY  1969  . RIGHT/LEFT HEART CATH AND CORONARY ANGIOGRAPHY N/A 08/20/2016   Procedure: Right/Left Heart Cath and Coronary Angiography;  Surgeon: [place] Blanks, MD;  Location: [place] CV LAB;  Service: Cardiovascular;  Laterality:  N/A;  . TEE WITHOUT CARDIOVERSION N/A 07/22/2016   Procedure: TRANSESOPHAGEAL ECHOCARDIOGRAM (TEE);  Surgeon: Skeet Latch, MD;  Location: [place];  Service: Cardiovascular;  Laterality: N/A;  . TEE WITHOUT CARDIOVERSION N/A 08/31/2016   Procedure: TRANSESOPHAGEAL ECHOCARDIOGRAM (TEE);  Surgeon: [place] Blanks, MD;  Location: [place];  Service: Open Heart Surgery;  Laterality: N/A;  . TEE WITHOUT CARDIOVERSION N/A 12/01/2016   Procedure: TRANSESOPHAGEAL ECHOCARDIOGRAM (TEE);  Surgeon: Larey Dresser, MD;  Location[facility] ENDOSCOPY;  Service: Cardiovascular;  Laterality: N/A;  . TONSILLECTOMY  1964  . TRANSCATHETER AORTIC VALVE REPLACEMENT, TRANSFEMORAL N/A 08/31/2016   Procedure: TRANSCATHETER AORTIC VALVE REPLACEMENT, TRANSFEMORAL;  Surgeon: [place] Blanks, MD;  Location: [place];  Service: Open Heart Surgery;  Laterality: N/A;     Current Outpatient Medications  Medication Sig Dispense Refill  . acetaminophen (TYLENOL) 325 MG tablet Take 2 tablets (650 mg total) by mouth every 6 (six) hours as needed for mild pain. (Patient  taking differently: Take 650 mg by mouth every 6 (six) hours. )    . atorvastatin (LIPITOR) 40 MG tablet Take 20 mg by mouth daily.    [name] b complex-vitamin c-folic acid (NEPHRO-VITE) 0.8 MG TABS Take 1 tablet by mouth daily.     . bromocriptine (PARLODEL) 5 MG capsule Take 5 mg by mouth at bedtime.     . calcium acetate (PHOSLO) 667 MG capsule Take 2,668 mg by mouth 4 (four) times daily.     . cetirizine (ZYRTEC) 10 MG tablet Take 10 mg by mouth at bedtime.     . cinacalcet (SENSIPAR) 30 MG tablet Take 30 mg by mouth every evening.     . clindamycin (CLEOCIN) 150 MG capsule Take 13 capsules (1,950 mg total) by mouth See admin instructions. Take 2,000 mg by mouth one hour prior to dental procedure (Patient not taking: Reported on 06/02/2017) 30 capsule 2  . digoxin (LANOXIN) 0.125 MG tablet Take 0.5 tablets (0.0625 mg total) by mouth daily. 15 tablet 3  . dorzolamide-timolol (COSOPT) 22.3-6.8 MG/ML ophthalmic solution Place 1 drop into both eyes 2 (two) times daily.     . ferric citrate (AURYXIA) 1 GM 210 MG(Fe) tablet Take 420 mg by mouth 3 (three) times daily with meals.    . methocarbamol (ROBAXIN) 500 MG tablet Take 1 tablet (500 mg total) by mouth every 6 (six) hours as needed for muscle spasms. (Patient not taking: Reported on 06/02/2017) 10 tablet 0  . metoprolol tartrate (LOPRESSOR) 50 MG tablet Take 1 tablet (50 mg total) by mouth 2 (two) times daily. 60 tablet 0  . midodrine (PROAMATINE) 10 MG tablet Take 10 mg by mouth See admin instructions. 10 mg by mouth prior to dialysis and an extra 5 mg as needed for low b/p prior to dialysis (may also take 15 mg as needed for low b/p during dialysis)    . midodrine (PROAMATINE) 5 MG tablet Take 5-15 mg by mouth daily as needed (for a sudden drop in blood pressure).     . Nutritional Supplements (NEPRO) LIQD Take 237 mLs by mouth every Monday, Wednesday, and Friday with hemodialysis.     [name] omeprazole (PRILOSEC) 20 MG capsule Take 20 mg by mouth daily.      . polyethylene glycol (MIRALAX / GLYCOLAX) packet Take 17 g by mouth daily. (Patient not taking: Reported on 06/02/2017) 14 each 0  . predniSONE (DELTASONE) 5 MG tablet Take 5 mg by mouth at bedtime.   1  . senna-docusate (SENOKOT-S) 8.6-50  MG tablet Take 1 tablet by mouth at bedtime. (Patient taking differently: Take 1 tablet by mouth at bedtime as needed for mild constipation. ) 14 tablet 0  . sertraline (ZOLOFT) 50 MG tablet Take 50 mg by mouth daily.  5  . Tiotropium Bromide-Olodaterol (STIOLTO RESPIMAT) 2.5-2.5 MCG/ACT AERS Inhale 2 puffs into the lungs daily. (Patient taking differently: Inhale 2 puffs into the lungs 2 (two) times daily. ) 1 Inhaler 0  . warfarin (COUMADIN) 5 MG tablet Take 1-1.5 tablets by mouth daily as directed by coumadin clinic (Patient taking differently: Take 2.5-5 mg by mouth See admin instructions. 5 mg by mouth at night on Sun/Tues/Thurs/Sat and 2.5 mg on Mon/Wed/Fri) 120 tablet 1   No current facility-administered medications for this visit.     Allergies:   Penicillins; Codeine; and Tramadol    Social History:  The patient  reports that he quit smoking about 19 years ago. His smoking use included cigarettes. He has a 90.00 pack-year smoking history. he has never used smokeless tobacco. He reports that he does not drink alcohol or use drugs.    Family History:  The patient's family history includes Cancer in his brother; Heart attack in his father; Heart disease in his sister; Hypertension in his father; Stomach cancer in his mother; Stroke in his sister.    ROS:  Please see the history of present illness.   Otherwise, review of systems are positive for leg spasm.   All other systems are reviewed and negative.    PHYSICAL EXAM: VS:  BP (!) 104/54   Pulse 72   Ht 5' 10.5" (1.791 m)   Wt 196 lb 9.6 oz (89.2 kg)   BMI 27.81 kg/m  , BMI Body mass index is 27.81 kg/m. GENERAL:  Chronically ill-appearing HEENT: Pupils equal round and reactive, fundi not  visualized, oral mucosa unremarkable NECK:  No jugular venous distention, waveform within normal limits, carotid upstroke brisk and symmetric, no bruits, no thyromegaly LYMPHATICS:  No cervical adenopathy LUNGS:  Clear to auscultation bilaterally HEART:  Irregularly irregular. PMI not displaced or sustained,S1 and S2 within normal limits, no S3, no S4, no clicks, no rubs, III/VI systolic murmur at LUSB and apex. ABD:  Flat, positive bowel sounds normal in frequency in pitch, no bruits, no rebound, no guarding, no midline pulsatile mass, no hepatomegaly, no splenomegaly EXT:  2 plus pulses throughout, no edema, no cyanosis no clubbing.  L forearm AV fistula SKIN:  No rashes no nodules NEURO:  Cranial nerves II through XII grossly intact, motor grossly intact throughout PSYCH:  Cognitively intact, oriented to person place and time    EKG:  EKG is not ordered today. 09/09/15: Atrial fibrillation rate 108 bpm.  Prior inferior infarct. 01/16/15: Sinus bradycardia.  First degree heart block.  LAFB.  U waves. The ekg from 11/19/14 demonstrates sinus bradycardia at 49 bpm. First degree heart block.  08/06/16: Atrial fibrillation.  Rate 82 bpm.  Occcasional VP. 11/11/16: Atrial fibrillation. Rate 97 bpm. Left bundle branch block.  03/15/17: Atrial fibrillation  Rate 105 bpm.  LBBB.  PVC  Lexiscan Cardiolite 01/28/15:  Nuclear stress EF: 67%.  The left ventricular ejection fraction is hyperdynamic (>65%).  There was no ST segment deviation noted during stress.  This is a low risk study.  Low risk stress nuclear study with a small, severe, predominantly fixed distal inferior defect consistent with small prior infarct vs thinning; no significant ischemia; EF 67 with normal wall motion.  Echo 10/07/16: Study Conclusions  -  Left ventricle: The cavity size was normal. Wall thickness was   increased in a pattern of mild LVH. Systolic function was normal.   The estimated ejection fraction was in  the range of 55% to 60%.   Wall motion was normal; there were no regional wall motion   abnormalities. Doppler parameters are consistent with high   ventricular filling pressure. - Aortic valve: A bioprosthesis was present. - Mitral valve: Calcified annulus. There was mild regurgitation.   Valve area by pressure half-time: 2.08 cm^2. - Left atrium: The atrium was mildly dilated. - Right atrium: The atrium was moderately dilated. - Pulmonary arteries: Systolic pressure was mildly increased. PA   peak pressure: 36 mm Hg (S).  Impressions:  - Normal LV systolic function; mild LVH; elevated LV filling   pressure; s/p AVR with normal mean gradient (9 mmHg) and   calculated AVA of 1.4 cm2; no AI; mild MR; biatrial enlargement;   mild TR with mildly elevated pulmonary pressure.  Recent Labs: 11/11/2016: NT-Pro BNP 18,028 01/29/2017: ALT 19 05/13/2017: BUN 33; Creatinine, Ser 7.58; Hemoglobin 11.5; Magnesium 1.8; Platelets 150; Potassium 3.9; Sodium 141; TSH 1.526    Lipid Panel    Component Value Date/Time   CHOL 203 (H) 05/14/2017 0514   TRIG 273 (H) 05/14/2017 0514   HDL 27 (L) 05/14/2017 0514   CHOLHDL 7.5 05/14/2017 0514   VLDL 55 (H) 05/14/2017 0514   LDLCALC 121 (H) 05/14/2017 0514      Wt Readings from Last 3 Encounters:  05/13/17 192 lb 14.4 oz (87.5 kg)  04/28/17 198 lb (89.8 kg)  04/12/17 202 lb (91.6 kg)     Other studies Reviewed: Additional studies/ records that were reviewed today include: [facility] records Review of the above records demonstrates:  Please see elsewhere in the note.     ASSESSMENT AND PLAN:  # Severe aortic stenosis s/p TAVR:  # Shortness of breath: TAVR is functioning properly.  Symptoms have improved and TAVR looked good on echo 11/2016.  No changes.   # Hypotension/syncope:  Reduce metoprolol to 12.5mg  bid.  Continue midodrine 10mg  tid as needed.    # Bradycardia/longstanding persistent atrial fibrillation:  Mr. Sabine had a cardioversion  but only went into sinus rhythm temporarily.  He was on amiodarone at the time.  Heart rate much better on digoxin.  Reduce metoprolol as above and continue warfarin.  10  # Hyperlipidemia: LDL 76 10/2015.   he had myalgias on atorvastatin.  Since stopping it his muscle aches have improved.  However in the hospital his LDL was up to 121.  We will start pravastatin 10 mg daily.  Recheck lipids at follow-up.  Current medicines are reviewed at length with the patient today.  The patient does not have concerns regarding medicines.  The following changes have been made: start pravastatin and reduce metoprolol as above   Labs/ tests ordered today include:   No orders of the defined types were placed in this encounter.    Disposition:   FU with Dr. Jonelle Sidle C. [place] in 6 weeks.    Signed, Skeet Latch, MD  06/13/2017 8:30 AM    [place]

## 2017-06-14 ENCOUNTER — Ambulatory Visit: Payer: Medicare Other | Admitting: Cardiovascular Disease

## 2017-06-14 DIAGNOSIS — N186 End stage renal disease: Secondary | ICD-10-CM | POA: Diagnosis not present

## 2017-06-14 DIAGNOSIS — I4891 Unspecified atrial fibrillation: Secondary | ICD-10-CM | POA: Diagnosis not present

## 2017-06-14 DIAGNOSIS — I509 Heart failure, unspecified: Secondary | ICD-10-CM | POA: Diagnosis not present

## 2017-06-14 DIAGNOSIS — M6281 Muscle weakness (generalized): Secondary | ICD-10-CM | POA: Diagnosis not present

## 2017-06-14 DIAGNOSIS — M545 Low back pain: Secondary | ICD-10-CM | POA: Diagnosis not present

## 2017-06-14 DIAGNOSIS — F329 Major depressive disorder, single episode, unspecified: Secondary | ICD-10-CM | POA: Diagnosis not present

## 2017-06-14 DIAGNOSIS — D631 Anemia in chronic kidney disease: Secondary | ICD-10-CM | POA: Diagnosis not present

## 2017-06-14 DIAGNOSIS — M79609 Pain in unspecified limb: Secondary | ICD-10-CM | POA: Diagnosis not present

## 2017-06-15 DIAGNOSIS — N62 Hypertrophy of breast: Secondary | ICD-10-CM | POA: Diagnosis not present

## 2017-06-15 DIAGNOSIS — I10 Essential (primary) hypertension: Secondary | ICD-10-CM | POA: Diagnosis not present

## 2017-06-15 DIAGNOSIS — N2581 Secondary hyperparathyroidism of renal origin: Secondary | ICD-10-CM | POA: Diagnosis not present

## 2017-06-15 DIAGNOSIS — E1129 Type 2 diabetes mellitus with other diabetic kidney complication: Secondary | ICD-10-CM | POA: Diagnosis not present

## 2017-06-15 DIAGNOSIS — D631 Anemia in chronic kidney disease: Secondary | ICD-10-CM | POA: Diagnosis not present

## 2017-06-15 DIAGNOSIS — Z8679 Personal history of other diseases of the circulatory system: Secondary | ICD-10-CM | POA: Diagnosis not present

## 2017-06-15 DIAGNOSIS — N186 End stage renal disease: Secondary | ICD-10-CM | POA: Diagnosis not present

## 2017-06-15 DIAGNOSIS — E1121 Type 2 diabetes mellitus with diabetic nephropathy: Secondary | ICD-10-CM | POA: Diagnosis not present

## 2017-06-16 ENCOUNTER — Other Ambulatory Visit: Payer: Self-pay | Admitting: *Deleted

## 2017-06-16 ENCOUNTER — Encounter: Payer: Self-pay | Admitting: *Deleted

## 2017-06-16 DIAGNOSIS — Z9181 History of falling: Secondary | ICD-10-CM | POA: Diagnosis not present

## 2017-06-16 DIAGNOSIS — I509 Heart failure, unspecified: Secondary | ICD-10-CM | POA: Diagnosis not present

## 2017-06-16 DIAGNOSIS — D631 Anemia in chronic kidney disease: Secondary | ICD-10-CM | POA: Diagnosis not present

## 2017-06-16 DIAGNOSIS — Z950 Presence of cardiac pacemaker: Secondary | ICD-10-CM | POA: Diagnosis not present

## 2017-06-16 DIAGNOSIS — I4891 Unspecified atrial fibrillation: Secondary | ICD-10-CM | POA: Diagnosis not present

## 2017-06-16 DIAGNOSIS — N186 End stage renal disease: Secondary | ICD-10-CM | POA: Diagnosis not present

## 2017-06-16 DIAGNOSIS — F329 Major depressive disorder, single episode, unspecified: Secondary | ICD-10-CM | POA: Diagnosis not present

## 2017-06-16 DIAGNOSIS — M6281 Muscle weakness (generalized): Secondary | ICD-10-CM | POA: Diagnosis not present

## 2017-06-16 NOTE — Patient Outreach (Signed)
[name]) Care Management [place] Telephone Outreach, Transition of Care day 31  06/16/2017  Alexander [name] 1940-08-23 [id number]  Successful telephone outreach for trasition of care to Alexander [name], caregiver/ daughter, on [facility] CM written consent, of Alexander [name],77 y.o.maleoriginallyreferred to [place] from [facility] telephonic RN CM on original MD referral. Patient has history including, but not limited to, PAD, Aortic stenosis with TAVR 08/31/16, ESRD on HD (M, W, F), A-Fib on coumadin, DM, CVA in 2004, CHF, macular degeneration with legal blindness, and multiple falls. Patient presented to hospital 11/23/16 for chest pain and general malaise,sepsis, and A-fib with RVR, and was discharged to SNF for rehabilitation.Patient was subsequently discharged home from [facility] March 11, 2017 to self-care without home health services in place.  Patient experienced new hospital admission February 14-16, 2019 for near-syncope/ fall/ minor head injury; patient was discharged home to self-care with home health services in place.HIPAA/ identity verified with patient's daughter today.  Today,caregiver reports thatpatient is "doing well" and "hanging in there."  Caregiver reports that patient is not in distress, and she states that his pain "is the same as it always is," and that patient has not reported any recent dizzy spells, shortness of breath, syncope, or recent falls.  Caregiver denies concerns around patient current clinical condition, and reports that patient will be attending PCP office visit later today for ongoing follow up post-recent hospital discharge; reports her mother, patient's spouse will transport patient to appointment.  Caregiverfurther reports:  -- attended recent cardiology provider appointment, where patient's BP medications "were decreased; discussed medication changes made as a result of cardiology office visit; caregiver  verbalizes accurate report of changes made to medications and that patient's spouse continues filling patient's pill box; reports patient's spouse "feels good" about her ability to fill pill box, and that pill box has been updated around recent medication changes.  -- attended recent provider office visit to "a new doctor" about patient's (remote) history of male breast enlargement; reports PCP recommended this appointment to determine if patient needed ongoing medication for this (remote) issue; caregiver states that "nothing was decided at this visit," and that she is concerned because the provider that saw patient during visit "didn't seem to know anything about" patient's history; caregiver verbalized plan to re-contact this provider around follow to office visit this week, and this was encouraged.  -- home health services continue; patient has continued actively participating   -- all provider appointments reviewed; caregiver confirms that family will continue transporting patient to appointments; confirms that patient continues going to all (established) HD sessionsas scheduled  Daughter/ caregiverdeniesfurther issues, concerns, or problems today. Confirmed that patient/ caregivers havemy direct phone number, the main [facility] CM office phone number, and the [facility] CM 24-hour nurse advice phone number should issues arise prior to next scheduled [place] outreach by phone in 2 weeks. Encouraged patient/ caregiversto contact me directly if needs, questions, issues, or concerns arise prior to next scheduled outreach.  Plan:  Patient will take medications as prescribed and will attend all scheduled provider appointments  Patient will continue using assistive devices for fall prevention  Patient will continue actively participate in home healthservicesas ordered post-hospital discharge  Patient/ caregivers will continue reviewing previously provided educational material, as  indicated around patient's clinical condition   Patient/ caregiver will promptly notify care providers for any new concerns, issues, or problems that arise  [place] continue withscheduledtelephone outreachin 2 weeks  [place]  Problem One     Most Recent Value  Care Plan Problem One  Risk for hospital readmission related to recent hospitalization Feburary 13-16, 2019 for syncope, as evidenced by multiple recent hospitalizations  Role Documenting the Problem One  Care Management [place] for Problem One  Not Active  [facility] Long Term Goal   Over the next 31 days, patient will not experience hospital readmission, as evidenced by patient and caregiver reporting and review of EMR during [facility] RN CCM outreach  [facility] Long Term Goal Start Date  05/17/17  [facility] Long Term Goal Met Date  06/16/17- Goal met    [facility] CM Care Plan Problem Two     Most Recent Value  Care Plan Problem Two  Ongoing management of chronic pain, as previously evidenced by patient reporting, and confirmation of same today by caregiver reporting  Role Documenting the Problem Two  Care Management [place] for Problem Two  Active  [facility] CM Short Term Goal #1   Over the next 30 days, patient will attend scheduled radiology appointments post-pain management provider appointment, as evidenced by patient/ caregiver reporting and review of EMR during [facility] RN CCM outreach  [facility] CM Short Term Goal #1 Start Date  05/27/17  [facility] CM Short Term Goal #1 Met Date   06/16/17- Goal met    [facility] CM Care Plan Problem Three     Most Recent Value  Care Plan Problem Three  Self-health management deficit of chronic disease state of A-Fib and recurrent syncope, as evidenced by patient/ caregiver reporting post- recent hospital visit  Role Documenting the Problem [place] for Problem Three  Active  [facility] Long Term Goal   Over the next 50 days, patient/ caregiver will be  able to verbalize signs/ symptoms of pre- syncope, low BP, along with action plan for same, as evidenced by patient/caregiver reporting during [facility] RN CCM outreach  [facility] Long Term Goal Start Date  06/02/17  [facility] for Problem Three Long Term Goal  Discussed patient's current clinical status with caregiver,  reiterated signs/ symptoms pre-syncope, along with action plan,  confirmed that patient has not had any recent episodes of syncope and attended recent cardiology office visit on 06/13/17,  discussed medication changes made as a result of cariology office visit, and confirmed that patient understands new instructions and is taking as prescribed  [facility] CM Short Term Goal #1   Over the next 14 days, patient and caregivers will attend upcoming cardiology appointment and will discuss action plan for syncope with provider, as evidenced by patient/ caregiver reporting and review of EMR during [facility] RN CCM outreach  [facility] CM Short Term Goal #1 Start Date  06/02/17  [facility] CM Short Term Goal #1 Met Date  06/16/17- Goal met     [name], RN, BSN, [place] Coordinator [facility] Care Management  [phone]

## 2017-06-17 DIAGNOSIS — E1129 Type 2 diabetes mellitus with other diabetic kidney complication: Secondary | ICD-10-CM | POA: Diagnosis not present

## 2017-06-17 DIAGNOSIS — N186 End stage renal disease: Secondary | ICD-10-CM | POA: Diagnosis not present

## 2017-06-17 DIAGNOSIS — N2581 Secondary hyperparathyroidism of renal origin: Secondary | ICD-10-CM | POA: Diagnosis not present

## 2017-06-17 DIAGNOSIS — D631 Anemia in chronic kidney disease: Secondary | ICD-10-CM | POA: Diagnosis not present

## 2017-06-20 DIAGNOSIS — E1129 Type 2 diabetes mellitus with other diabetic kidney complication: Secondary | ICD-10-CM | POA: Diagnosis not present

## 2017-06-20 DIAGNOSIS — D631 Anemia in chronic kidney disease: Secondary | ICD-10-CM | POA: Diagnosis not present

## 2017-06-20 DIAGNOSIS — N186 End stage renal disease: Secondary | ICD-10-CM | POA: Diagnosis not present

## 2017-06-20 DIAGNOSIS — N2581 Secondary hyperparathyroidism of renal origin: Secondary | ICD-10-CM | POA: Diagnosis not present

## 2017-06-21 ENCOUNTER — Encounter: Payer: Self-pay | Admitting: Cardiology

## 2017-06-21 ENCOUNTER — Ambulatory Visit (INDEPENDENT_AMBULATORY_CARE_PROVIDER_SITE_OTHER): Payer: Medicare Other | Admitting: Cardiology

## 2017-06-21 VITALS — BP 130/72 | HR 70 | Ht 70.5 in | Wt 196.0 lb

## 2017-06-21 DIAGNOSIS — E785 Hyperlipidemia, unspecified: Secondary | ICD-10-CM | POA: Diagnosis not present

## 2017-06-21 DIAGNOSIS — I482 Chronic atrial fibrillation: Secondary | ICD-10-CM | POA: Diagnosis not present

## 2017-06-21 DIAGNOSIS — G629 Polyneuropathy, unspecified: Secondary | ICD-10-CM | POA: Diagnosis not present

## 2017-06-21 DIAGNOSIS — I4821 Permanent atrial fibrillation: Secondary | ICD-10-CM

## 2017-06-21 DIAGNOSIS — I4891 Unspecified atrial fibrillation: Secondary | ICD-10-CM | POA: Diagnosis not present

## 2017-06-21 DIAGNOSIS — F329 Major depressive disorder, single episode, unspecified: Secondary | ICD-10-CM | POA: Diagnosis not present

## 2017-06-21 DIAGNOSIS — D631 Anemia in chronic kidney disease: Secondary | ICD-10-CM | POA: Diagnosis not present

## 2017-06-21 DIAGNOSIS — N186 End stage renal disease: Secondary | ICD-10-CM | POA: Diagnosis not present

## 2017-06-21 DIAGNOSIS — I495 Sick sinus syndrome: Secondary | ICD-10-CM

## 2017-06-21 DIAGNOSIS — M79604 Pain in right leg: Secondary | ICD-10-CM | POA: Diagnosis not present

## 2017-06-21 DIAGNOSIS — M47816 Spondylosis without myelopathy or radiculopathy, lumbar region: Secondary | ICD-10-CM | POA: Diagnosis not present

## 2017-06-21 DIAGNOSIS — M6281 Muscle weakness (generalized): Secondary | ICD-10-CM | POA: Diagnosis not present

## 2017-06-21 DIAGNOSIS — M545 Low back pain: Secondary | ICD-10-CM | POA: Diagnosis not present

## 2017-06-21 DIAGNOSIS — I509 Heart failure, unspecified: Secondary | ICD-10-CM | POA: Diagnosis not present

## 2017-06-21 LAB — CUP PACEART INCLINIC DEVICE CHECK
Battery Remaining Longevity: 124 mo
Date Time Interrogation Session: 20190326133143
Implantable Lead Implant Date: 20161213
Implantable Lead Location: 753859
Implantable Lead Location: 753860
Implantable Pulse Generator Implant Date: 20161213
Lead Channel Impedance Value: 337.5 Ohm
Lead Channel Pacing Threshold Amplitude: 1 V
Lead Channel Pacing Threshold Pulse Width: 0.5 ms
Lead Channel Sensing Intrinsic Amplitude: 0.6 mV
Lead Channel Setting Sensing Sensitivity: 2 mV
MDC IDC LEAD IMPLANT DT: 20161213
MDC IDC MSMT BATTERY VOLTAGE: 2.99 V
MDC IDC MSMT LEADCHNL RV IMPEDANCE VALUE: 450 Ohm
MDC IDC MSMT LEADCHNL RV SENSING INTR AMPL: 9.5 mV
MDC IDC PG SERIAL: 7839215
MDC IDC SET LEADCHNL RV PACING AMPLITUDE: 2.5 V
MDC IDC SET LEADCHNL RV PACING PULSEWIDTH: 0.5 ms
MDC IDC STAT BRADY RA PERCENT PACED: 0 %
MDC IDC STAT BRADY RV PERCENT PACED: 24 %

## 2017-06-21 MED ORDER — DILTIAZEM HCL ER COATED BEADS 120 MG PO CP24: 120.0000 mg | ORAL_CAPSULE | Freq: Every day | ORAL | 11 refills | Status: DC

## 2017-06-21 NOTE — Patient Instructions (Signed)
Medication Instructions:  Your physician has recommended you make the following change in your medication:  1. STOP Metoprolol 2. START Diltiazem 120 mg once daily   *If you need a refill on your cardiac medications before your next appointment, please call your pharmacy*  Labwork: None ordered  Testing/Procedures: None ordered  Follow-Up: Remote monitoring is used to monitor your Pacemaker or ICD from home. This monitoring reduces the number of office visits required to check your device to one time per year. It allows [place] to keep an eye on the functioning of your device to ensure it is working properly. You are scheduled for a device check from home on 08/02/2017. You may send your transmission at any time that day. If you have a wireless device, the transmission will be sent automatically. After your physician reviews your transmission, you will receive a postcard with your next transmission date.  Your physician wants you to follow-up in: 1 year with Dr. Curt Bears.  You will receive a reminder letter in the mail two months in advance. If you don't receive a letter, please call our office to schedule the follow-up appointment.  Thank you for choosing [facility]!!   Alexander Curet, RN [phone]  Any Other Special Instructions Will Be Listed Below (If Applicable).  Diltiazem extended-release capsules or tablets What is this medicine? DILTIAZEM (dil TYE a zem) is a calcium-channel blocker. It affects the amount of calcium found in your heart and muscle cells. This relaxes your blood vessels, which can reduce the amount of work the heart has to do. This medicine is used to treat high blood pressure and chest pain caused by angina. This medicine may be used for other purposes; ask your health care provider or pharmacist if you have questions. COMMON BRAND NAME(S): Cardizem CD, Cardizem LA, Cardizem SR, Cartia XT, Dilacor XR, Dilt-CD, Diltia XT, Diltzac, Matzim LA, Rema Fendt, Tiamate,  Tiazac What should I tell my health care provider before I take this medicine? They need to know if you have any of these conditions: -heart problems, low blood pressure, irregular heartbeat -liver disease -previous heart attack -an unusual or allergic reaction to diltiazem, other medicines, foods, dyes, or preservatives -pregnant or trying to get pregnant -breast-feeding How should I use this medicine? Take this medicine by mouth with a glass of water. Follow the directions on the prescription label. Swallow whole, do not crush or chew. Ask your doctor or pharmacist if your should take this medicine with food. Take your doses at regular intervals. Do not take your medicine more often then directed. Do not stop taking except on the advice of your doctor or health care professional. Ask your doctor or health care professional how to gradually reduce the dose. Talk to your pediatrician regarding the use of this medicine in children. Special care may be needed. Overdosage: If you think you have taken too much of this medicine contact a poison control center or emergency room at once. NOTE: This medicine is only for you. Do not share this medicine with others. What if I miss a dose? If you miss a dose, take it as soon as you can. If it is almost time for your next dose, take only that dose. Do not take double or extra doses. What may interact with this medicine? Do not take this medicine with any of the following medications: -cisapride -hawthorn -pimozide -ranolazine -red yeast rice This medicine may also interact with the following medications: -buspirone -carbamazepine -cimetidine -cyclosporine -digoxin -local anesthetics  or general anesthetics -lovastatin -medicines for anxiety or difficulty sleeping like midazolam and triazolam -medicines for high blood pressure or heart problems -quinidine -rifampin, rifabutin, or rifapentine This list may not describe all possible interactions.  Give your health care provider a list of all the medicines, herbs, non-prescription drugs, or dietary supplements you use. Also tell them if you smoke, drink alcohol, or use illegal drugs. Some items may interact with your medicine. What should I watch for while using this medicine? Check your blood pressure and pulse rate regularly. Ask your doctor or health care professional what your blood pressure and pulse rate should be and when you should contact him or her. You may feel dizzy or lightheaded. Do not drive, use machinery, or do anything that needs mental alertness until you know how this medicine affects you. To reduce the risk of dizzy or fainting spells, do not sit or stand up quickly, especially if you are an older patient. Alcohol can make you more dizzy or increase flushing and rapid heartbeats. Avoid alcoholic drinks. What side effects may I notice from receiving this medicine? Side effects that you should report to your doctor or health care professional as soon as possible: -allergic reactions like skin rash, itching or hives, swelling of the face, lips, or tongue -confusion, mental depression -feeling faint or lightheaded, falls -redness, blistering, peeling or loosening of the skin, including inside the mouth -slow, irregular heartbeat -swelling of the feet and ankles -unusual bleeding or bruising, pinpoint red spots on the skin Side effects that usually do not require medical attention (report to your doctor or health care professional if they continue or are bothersome): -constipation or diarrhea -difficulty sleeping -facial flushing -headache -nausea, vomiting -sexual dysfunction -weak or tired This list may not describe all possible side effects. Call your doctor for medical advice about side effects. You may report side effects to FDA at 1-800-FDA-1088. Where should I keep my medicine? Keep out of the reach of children. Store at room temperature between 15 and 30 degrees  C (59 and 86 degrees F). Protect from humidity. Throw away any unused medicine after the expiration date. NOTE: This sheet is a summary. It may not cover all possible information. If you have questions about this medicine, talk to your doctor, pharmacist, or health care provider.  2018 Elsevier/Gold Standard (2007-07-06 14:35:47)

## 2017-06-21 NOTE — Progress Notes (Signed)
Electrophysiology Office Note   Date:  06/21/2017   ID:  [name], DOB April 18, 1940, MRN [id number]  PCP:  Deland Pretty, MD  Cardiologist:  Skeet Latch Primary Electrophysiologist:  Constance Haw, MD    Chief Complaint  Patient presents with  . Pacemaker Check    Persistent Afib     History of Present Illness: Alexander [name] is a 77 y.o. male who presents today for electrophysiology evaluation.   He has a history of moderate aortic stenosis, ESRD on HD, diabetes mellitus, hyperlipidemia, carotid artery disease with complete occlusion of the L carotid, prior TIA and atrial fibrillation.  He had a dual chamber pacemaker placed on 03/11/15.  His symptoms at that time are weakness, dizziness, and presyncope.  He was loaded on amiodarone and had a cardioversion but quickly returned to atrial fibrillation.  Amiodarone was stopped and he was put on metoprolol.    Today, denies symptoms of palpitations, chest pain, shortness of breath, orthopnea, PND, lower extremity edema, claudication, dizziness, presyncope, syncope, bleeding, or neurologic sequela. The patient is tolerating medications without difficulties.  Overall he is feeling well.  His main complaint today is of fatigue and weakness.  This is been going on for many months.  He did have a few falls in February that were deemed orthostatic.  He is currently taking Midrin.  Past Medical History:  Diagnosis Date  . Anemia   . Aortic stenosis 06/15/12   TEE - EF 42-35%; grade 1 diastolic dysfunction; mild/mod aortic valve stenosis; Mitral valve had calcified annulus, mild pulm htn PA peak pressure 31mmHg  . Barrett's esophagus 05/2003  . Bradycardia 2017   [employer] 2240 Assurity dual-lead pacemaker  . Carpal tunnel syndrome, bilateral 11/03/2015  . Colon polyps   . CVA (cerebral infarction)    2004/affected left side  . Depression   . Diabetes mellitus without complication ([place])   . Diabetic peripheral neuropathy  ([place]) 10/02/2015  . Diverticulosis   . Dyspnea    with exertion  . ESRD (end stage renal disease) on dialysis ([place])    "[facility]; [place]; MWF" (05/12/2017)  . GERD (gastroesophageal reflux disease)   . Glaucoma   . History of kidney stones   . Hyperlipidemia   . Hypertension   . Legally blind   . Macular degeneration    both eyes  . Orthostatic hypotension 09/09/2015  . Paroxysmal atrial fibrillation ([facility])   . Peptic ulcer    bleeding, 1969  . Presence of permanent cardiac pacemaker   . S/P epidural steroid injection    last  injection over 10 years ago  . Seasonal allergies   . Tubular adenoma of colon 07/2001   Past Surgical History:  Procedure Laterality Date  . AV FISTULA PLACEMENT  2009  . BACK SURGERY    . CARDIOVERSION N/A 11/13/2015   Procedure: CARDIOVERSION;  Surgeon: Troy Sine, MD;  Location: [facility] ENDOSCOPY;  Service: Cardiovascular;  Laterality: N/A;  . CARDIOVERSION N/A 01/13/2016   Procedure: CARDIOVERSION;  Surgeon: Micaylah Bertucci Meredith [name], MD;  Location: [facility];  Service: Cardiovascular;  Laterality: N/A;  . [place]   right eye  . CYSTOSCOPY  several times   kidney stones  . EP IMPLANTABLE DEVICE N/A 03/11/2015   Procedure: Pacemaker Implant;  Surgeon: Etherine Mackowiak Meredith [name], MD;  [place];  Laterality: Left  . LAMINECTOMY  1969  . RIGHT/LEFT HEART CATH AND CORONARY ANGIOGRAPHY N/A 08/20/2016   Procedure: Right/Left  Heart Cath and Coronary Angiography;  Surgeon: Burnell Blanks, MD;  Location: [place] CV LAB;  Service: Cardiovascular;  Laterality: N/A;  . TEE WITHOUT CARDIOVERSION N/A 07/22/2016   Procedure: TRANSESOPHAGEAL ECHOCARDIOGRAM (TEE);  Surgeon: Skeet Latch, MD;  Location: [place];  Service: Cardiovascular;  Laterality: N/A;  . TEE WITHOUT CARDIOVERSION N/A 08/31/2016   Procedure: TRANSESOPHAGEAL ECHOCARDIOGRAM (TEE);  Surgeon: Burnell Blanks, MD;  Location: [place];  Service: Open Heart  Surgery;  Laterality: N/A;  . TEE WITHOUT CARDIOVERSION N/A 12/01/2016   Procedure: TRANSESOPHAGEAL ECHOCARDIOGRAM (TEE);  Surgeon: Larey Dresser, MD;  Location: [facility] END[facility]COPY;  Service: Cardiovascular;  Laterality: N/A;  . TONSILLECTOMY  1964  . TRANSCATHETER AORTIC VALVE REPLACEMENT, TRANSFEMORAL N/A 08/31/2016   Procedure: TRANSCATHETER AORTIC VALVE REPLACEMENT, TRANSFEMORAL;  Surgeon: Burnell Blanks, MD;  Location: [place];  Service: Open Heart Surgery;  Laterality: N/A;     Current Outpatient Medications  Medication Sig Dispense Refill  . acetaminophen (TYLENOL) 325 MG tablet Take 2 tablets (650 mg total) by mouth every 6 (six) hours as needed for mild pain. (Patient taking differently: Take 650 mg by mouth every 6 (six) hours. )    . b complex-vitamin c-folic acid (NEPHRO-VITE) 0.8 MG TABS Take 1 tablet by mouth daily.     . bromocriptine (PARLODEL) 5 MG capsule Take 5 mg by mouth at bedtime.     . cetirizine (ZYRTEC) 10 MG tablet Take 10 mg by mouth at bedtime.     . cinacalcet (SENSIPAR) 30 MG tablet Take 30 mg by mouth every evening.     . clindamycin (CLEOCIN) 150 MG capsule Take 13 capsules (1,950 mg total) by mouth See admin instructions. Take 2,000 mg by mouth one hour prior to dental procedure 30 capsule 2  . digoxin (LANOXIN) 0.125 MG tablet Take 0.5 tablets (0.0625 mg total) by mouth daily. 15 tablet 3  . dorzolamide-timolol (COSOPT) 22.3-6.8 MG/ML ophthalmic solution Place 1 drop into both eyes 2 (two) times daily.     . ferric citrate (AURYXIA) 1 GM 210 MG(Fe) tablet Take 420 mg by mouth 3 (three) times daily with meals.    . methocarbamol (ROBAXIN) 500 MG tablet Take 1 tablet (500 mg total) by mouth every 6 (six) hours as needed for muscle spasms. 10 tablet 0  . metoprolol tartrate (LOPRESSOR) 25 MG tablet TAKE 1/2 TABLET BY MOUTH TWICE A DAY 90 tablet 1  . midodrine (PROAMATINE) 10 MG tablet Take 10 mg by mouth See admin instructions. 10 mg by mouth prior to dialysis  and an extra 5 mg as needed for low b/p prior to dialysis (may also take 15 mg as needed for low b/p during dialysis)    . midodrine (PROAMATINE) 5 MG tablet Take 5-15 mg by mouth daily as needed (for a sudden drop in blood pressure).     . Nutritional Supplements (NEPRO) LIQD Take 237 mLs by mouth every Monday, Wednesday, and Friday with hemodialysis.     [name] omeprazole (PRILOSEC) 20 MG capsule Take 20 mg by mouth daily.    . polyethylene glycol (MIRALAX / GLYCOLAX) packet Take 17 g by mouth daily. 14 each 0  . pravastatin (PRAVACHOL) 10 MG tablet Take 1 tablet (10 mg total) by mouth daily. 90 tablet 1  . predniSONE (DELTASONE) 5 MG tablet Take 5 mg by mouth at bedtime.   1  . senna-docusate (SENOKOT-S) 8.6-50 MG tablet Take 1 tablet by mouth at bedtime. (Patient taking differently: Take 1 tablet by mouth  at bedtime as needed for mild constipation. ) 14 tablet 0  . sertraline (ZOLOFT) 50 MG tablet Take 50 mg by mouth daily.  5  . Tiotropium Bromide-Olodaterol (STIOLTO RESPIMAT) 2.5-2.5 MCG/ACT AERS Inhale 2 puffs into the lungs daily. (Patient taking differently: Inhale 2 puffs into the lungs 2 (two) times daily. ) 1 Inhaler 0  . warfarin (COUMADIN) 5 MG tablet Take 1-1.5 tablets by mouth daily as directed by coumadin clinic (Patient taking differently: Take 2.5-5 mg by mouth See admin instructions. 5 mg by mouth at night on Sun/Tues/Thurs/Sat and 2.5 mg on Mon/Wed/Fri) 120 tablet 1   No current facility-administered medications for this visit.     Allergies:   Penicillins; Atorvastatin; Codeine; and Tramadol   Social History:  The patient  reports that he quit smoking about 19 years ago. His smoking use included cigarettes. He has a 90.00 pack-year smoking history. He has never used smokeless tobacco. He reports that he does not drink alcohol or use drugs.   Family History:  The patient's family history includes Cancer in his brother; Heart attack in his father; Heart disease in his sister;  Hypertension in his father; Stomach cancer in his mother; Stroke in his sister.   ROS:  Please see the history of present illness.   Otherwise, review of systems is positive for none.   All other systems are reviewed and negative.   PHYSICAL EXAM: VS:  BP 130/72   Pulse 70   Ht 5' 10.5" (1.791 m)   Wt 196 lb (88.9 kg)   BMI 27.73 kg/m  , BMI Body mass index is 27.73 kg/m. GEN: Well nourished, well developed, in no acute distress  HEENT: normal  Neck: no JVD, carotid bruits, or masses Cardiac: iRRR; no murmurs, rubs, or gallops,no edema  Respiratory:  clear to auscultation bilaterally, normal work of breathing GI: soft, nontender, nondistended, + BS MS: no deformity or atrophy  Skin: warm and dry, device site well healed Neuro:  Strength and sensation are intact Psych: euthymic mood, full affect  EKG:  EKG is not ordered today. Personal review of the ekg ordered 05/12/17 shows atrial fibrillation, LBBB, PVCs, rate 74  Personal review of the device interrogation today. Results in [place]: 11/11/2016: NT-Pro BNP 18,028 01/29/2017: ALT 19 05/13/2017: BUN 33; Creatinine, Ser 7.58; Hemoglobin 11.5; Magnesium 1.8; Platelets 150; Potassium 3.9; Sodium 141; TSH 1.526    Lipid Panel     Component Value Date/Time   CHOL 203 (H) 05/14/2017 0514   TRIG 273 (H) 05/14/2017 0514   HDL 27 (L) 05/14/2017 0514   CHOLHDL 7.5 05/14/2017 0514   VLDL 55 (H) 05/14/2017 0514   LDLCALC 121 (H) 05/14/2017 0514     Wt Readings from Last 3 Encounters:  06/21/17 196 lb (88.9 kg)  06/13/17 196 lb 9.6 oz (89.2 kg)  05/13/17 192 lb 14.4 oz (87.5 kg)      11/28/14 TTE: Study Conclusions  - Left ventricle: The cavity size was normal. Systolic function was normal. The estimated ejection fraction was in the range of 60% to 65%. Wall motion was normal; there were no regional wall motion abnormalities. Features are consistent with a pseudonormal left ventricular filling pattern,  with concomitant abnormal relaxation and increased filling pressure (grade 2 diastolic dysfunction). Doppler parameters are consistent with high ventricular filling pressure. - Aortic valve: Severe diffuse thickening and calcification. There was moderate stenosis. VTI ratio of LVOT to aortic valve: 0.31. Valve area (VTI): 1.27 cm^2. Indexed valve  area (VTI): 0.59 cm^2/m^2. Mean velocity ratio of LVOT to aortic valve: 0.31. Valve area (Vmean): 1.29 cm^2. Indexed valve area (Vmean): 0.6 cm^2/m^2.  Mean gradient (S): 22 mm Hg. - Mitral valve: Severely calcified annulus. There was mild regurgitation. - Left atrium: The atrium was mildly dilated. - Right ventricle: The cavity size was mildly dilated. Wall thickness was normal. - Tricuspid valve: There was trivial regurgitation. - Pulmonic valve: There was trivial regurgitation. - Pulmonary arteries: PA peak pressure: 37 mm Hg (S).   ASSESSMENT AND PLAN:  1.  Permanent atrial fibrillation: Currently on warfarin.  He does have an elevated stroke risk.  His atrial fibrillation has become permanent.  He has had some fast ventricular rates.  He is also having some fatigue.  We Nandana Krolikowski therefore stop his metoprolol and start him on 120 mg of diltiazem.  Hopefully this Shareece Bultman not affect his orthostasis.   This patients CHA2DS2-VASc Score and unadjusted Ischemic Stroke Rate (% per year) is equal to 4.8 % stroke rate/year from a score of 4  Above score calculated as 1 point each if present [CHF, HTN, DM, Vascular=MI/PAD/Aortic Plaque, Age if 65-74, or Male] Above score calculated as 2 points each if present [Age > 75, or Stroke/TIA/TE]   2.  Somatic bradycardia/atrial fibrillation: Dual-chamber pacemaker functioning appropriately.  3.  Hyperlipidemia: On pravastatin per primary cardiology  Current medicines are reviewed at length with the patient today.   The patient has concerns regarding his medicines.  The following changes were  made today: Metoprolol start low-dose diltiazem  Labs/ tests ordered today include:   No orders of the defined types were placed in this encounter.    Disposition:   FU with Nykerria Macconnell Meredith [name] 12 months  Signed, Tighe Gitto Meredith [name], MD  06/21/2017 11:00 AM     [facility] [address] [place] [address] [phone] (office) [phone] (fax)

## 2017-06-22 DIAGNOSIS — D631 Anemia in chronic kidney disease: Secondary | ICD-10-CM | POA: Diagnosis not present

## 2017-06-22 DIAGNOSIS — I482 Chronic atrial fibrillation: Secondary | ICD-10-CM | POA: Diagnosis not present

## 2017-06-22 DIAGNOSIS — E1129 Type 2 diabetes mellitus with other diabetic kidney complication: Secondary | ICD-10-CM | POA: Diagnosis not present

## 2017-06-22 DIAGNOSIS — N186 End stage renal disease: Secondary | ICD-10-CM | POA: Diagnosis not present

## 2017-06-22 DIAGNOSIS — N2581 Secondary hyperparathyroidism of renal origin: Secondary | ICD-10-CM | POA: Diagnosis not present

## 2017-06-23 DIAGNOSIS — D631 Anemia in chronic kidney disease: Secondary | ICD-10-CM | POA: Diagnosis not present

## 2017-06-23 DIAGNOSIS — N186 End stage renal disease: Secondary | ICD-10-CM | POA: Diagnosis not present

## 2017-06-23 DIAGNOSIS — M6281 Muscle weakness (generalized): Secondary | ICD-10-CM | POA: Diagnosis not present

## 2017-06-23 DIAGNOSIS — I4891 Unspecified atrial fibrillation: Secondary | ICD-10-CM | POA: Diagnosis not present

## 2017-06-23 DIAGNOSIS — I509 Heart failure, unspecified: Secondary | ICD-10-CM | POA: Diagnosis not present

## 2017-06-23 DIAGNOSIS — F329 Major depressive disorder, single episode, unspecified: Secondary | ICD-10-CM | POA: Diagnosis not present

## 2017-06-24 ENCOUNTER — Ambulatory Visit (INDEPENDENT_AMBULATORY_CARE_PROVIDER_SITE_OTHER): Payer: Medicare Other | Admitting: Pharmacist

## 2017-06-24 DIAGNOSIS — E1129 Type 2 diabetes mellitus with other diabetic kidney complication: Secondary | ICD-10-CM | POA: Diagnosis not present

## 2017-06-24 DIAGNOSIS — N186 End stage renal disease: Secondary | ICD-10-CM | POA: Diagnosis not present

## 2017-06-24 DIAGNOSIS — N2581 Secondary hyperparathyroidism of renal origin: Secondary | ICD-10-CM | POA: Diagnosis not present

## 2017-06-24 DIAGNOSIS — Z7901 Long term (current) use of anticoagulants: Secondary | ICD-10-CM | POA: Diagnosis not present

## 2017-06-24 DIAGNOSIS — I4891 Unspecified atrial fibrillation: Secondary | ICD-10-CM | POA: Diagnosis not present

## 2017-06-24 DIAGNOSIS — D631 Anemia in chronic kidney disease: Secondary | ICD-10-CM | POA: Diagnosis not present

## 2017-06-24 LAB — POCT INR: INR: 3.4

## 2017-06-25 DIAGNOSIS — E1122 Type 2 diabetes mellitus with diabetic chronic kidney disease: Secondary | ICD-10-CM | POA: Diagnosis not present

## 2017-06-25 DIAGNOSIS — M6281 Muscle weakness (generalized): Secondary | ICD-10-CM | POA: Diagnosis not present

## 2017-06-25 DIAGNOSIS — N186 End stage renal disease: Secondary | ICD-10-CM | POA: Diagnosis not present

## 2017-06-25 DIAGNOSIS — I509 Heart failure, unspecified: Secondary | ICD-10-CM | POA: Diagnosis not present

## 2017-06-25 DIAGNOSIS — I132 Hypertensive heart and chronic kidney disease with heart failure and with stage 5 chronic kidney disease, or end stage renal disease: Secondary | ICD-10-CM | POA: Diagnosis not present

## 2017-06-25 DIAGNOSIS — I951 Orthostatic hypotension: Secondary | ICD-10-CM | POA: Diagnosis not present

## 2017-06-27 DIAGNOSIS — Z992 Dependence on renal dialysis: Secondary | ICD-10-CM | POA: Diagnosis not present

## 2017-06-27 DIAGNOSIS — N186 End stage renal disease: Secondary | ICD-10-CM | POA: Diagnosis not present

## 2017-06-27 DIAGNOSIS — N2581 Secondary hyperparathyroidism of renal origin: Secondary | ICD-10-CM | POA: Diagnosis not present

## 2017-06-27 DIAGNOSIS — E1122 Type 2 diabetes mellitus with diabetic chronic kidney disease: Secondary | ICD-10-CM | POA: Diagnosis not present

## 2017-06-27 DIAGNOSIS — I951 Orthostatic hypotension: Secondary | ICD-10-CM | POA: Diagnosis not present

## 2017-06-27 DIAGNOSIS — I132 Hypertensive heart and chronic kidney disease with heart failure and with stage 5 chronic kidney disease, or end stage renal disease: Secondary | ICD-10-CM | POA: Diagnosis not present

## 2017-06-27 DIAGNOSIS — D631 Anemia in chronic kidney disease: Secondary | ICD-10-CM | POA: Diagnosis not present

## 2017-06-27 DIAGNOSIS — I509 Heart failure, unspecified: Secondary | ICD-10-CM | POA: Diagnosis not present

## 2017-06-27 DIAGNOSIS — M6281 Muscle weakness (generalized): Secondary | ICD-10-CM | POA: Diagnosis not present

## 2017-06-27 DIAGNOSIS — E1129 Type 2 diabetes mellitus with other diabetic kidney complication: Secondary | ICD-10-CM | POA: Diagnosis not present

## 2017-06-28 DIAGNOSIS — I132 Hypertensive heart and chronic kidney disease with heart failure and with stage 5 chronic kidney disease, or end stage renal disease: Secondary | ICD-10-CM | POA: Diagnosis not present

## 2017-06-28 DIAGNOSIS — I951 Orthostatic hypotension: Secondary | ICD-10-CM | POA: Diagnosis not present

## 2017-06-28 DIAGNOSIS — I509 Heart failure, unspecified: Secondary | ICD-10-CM | POA: Diagnosis not present

## 2017-06-28 DIAGNOSIS — N186 End stage renal disease: Secondary | ICD-10-CM | POA: Diagnosis not present

## 2017-06-28 DIAGNOSIS — M6281 Muscle weakness (generalized): Secondary | ICD-10-CM | POA: Diagnosis not present

## 2017-06-28 DIAGNOSIS — E1122 Type 2 diabetes mellitus with diabetic chronic kidney disease: Secondary | ICD-10-CM | POA: Diagnosis not present

## 2017-06-29 DIAGNOSIS — N2581 Secondary hyperparathyroidism of renal origin: Secondary | ICD-10-CM | POA: Diagnosis not present

## 2017-06-29 DIAGNOSIS — N186 End stage renal disease: Secondary | ICD-10-CM | POA: Diagnosis not present

## 2017-06-29 DIAGNOSIS — D631 Anemia in chronic kidney disease: Secondary | ICD-10-CM | POA: Diagnosis not present

## 2017-06-29 DIAGNOSIS — E1129 Type 2 diabetes mellitus with other diabetic kidney complication: Secondary | ICD-10-CM | POA: Diagnosis not present

## 2017-06-30 ENCOUNTER — Encounter: Payer: Self-pay | Admitting: *Deleted

## 2017-06-30 ENCOUNTER — Other Ambulatory Visit: Payer: Self-pay | Admitting: *Deleted

## 2017-06-30 DIAGNOSIS — N186 End stage renal disease: Secondary | ICD-10-CM | POA: Diagnosis not present

## 2017-06-30 DIAGNOSIS — I132 Hypertensive heart and chronic kidney disease with heart failure and with stage 5 chronic kidney disease, or end stage renal disease: Secondary | ICD-10-CM | POA: Diagnosis not present

## 2017-06-30 DIAGNOSIS — I509 Heart failure, unspecified: Secondary | ICD-10-CM | POA: Diagnosis not present

## 2017-06-30 DIAGNOSIS — E1122 Type 2 diabetes mellitus with diabetic chronic kidney disease: Secondary | ICD-10-CM | POA: Diagnosis not present

## 2017-06-30 DIAGNOSIS — I951 Orthostatic hypotension: Secondary | ICD-10-CM | POA: Diagnosis not present

## 2017-06-30 DIAGNOSIS — M6281 Muscle weakness (generalized): Secondary | ICD-10-CM | POA: Diagnosis not present

## 2017-06-30 NOTE — Patient Outreach (Signed)
[place] [facility]) Care Management [place] Telephone Outreach  06/30/2017  Alexander [name] Nov 16, 1940 [id number]  Successful telephone outreach for trasition of care to Alexander [name], caregiver/ daughter, on Alexander [name] CM written consent, ofFred C [name],76 y.o.maleoriginallyreferred to [place] from [name] telephonic RN CM on original MD referral. Patient has history including, but not limited to, PAD, Aortic stenosis with TAVR 08/31/16, ESRD on HD (M, W, F), A-Fib on coumadin, DM, CVA in 2004, CHF, macular degeneration with legal blindness, and multiple falls. Patient presented to hospital 11/23/16 for chest pain and general malaise,sepsis, and A-fib with RVR, and was discharged to SNF for rehabilitation.Patient was subsequently discharged home from [facility] March 11, 2017 to self-care without home health services in place.  Patient experienced new hospital admission February 14-16, 2019 for near-syncope/ fall/ minor head injury; patient was discharged home to self-care with home health services in place.HIPAA/ identity verified with patient's daughtertoday.  Today,caregiverreports thatpatientis "doing really well and in better spirits." Caregiver reports that patient is not in distress, and she states that his pain "has not changed and is the same as it always is," and that patient has not reported any recent dizzy spells, shortness of breath, syncope, or recent falls. Caregiver denies concerns around patient current clinical condition.  Caregiverfurther reports:  -- attended recent cardiology provider appointment June 21, 2017, where patient's BP medications "were changed;" this was verified through review of EMR, and we discussed medication changes made as a result of cardiology office visit; caregiver verbalizes accurate report of changes made to medications and that patient's spouse continues filling patient's pill box; reports "no issues or  concerns" around medications, and confirms that patient has and is taking medications as instructed around recent changes made during provider office visit.  Discussed need to increase monitoring and recording of patient's BP around medication changes, especially if patient begins to experience signs/ symptoms around low BP/ dizziness.  Changes to medication noted today:  Metoprolol discontinued; diltiazem 120 mg QD started.  -- home health services have now ended  -- patient/ caregiver discovered "new walker" that stands "taller" than his previous walker; states this was discovered as a result of an interaction they had on a breakfast outing; stated that patient "tried using" new walker and "loved it."  Reports today that patient ordered this new walker and it is expected to be delivered today; reports patient "very excited" about obtaining this new walker, as it "allows him to stand up more upright."  Fall prevention education was reiterated with caregiver today, with positive reinforcement provided for obtaining/ updating patient's current walker to something that feels more comfortable to him.  -- all provider appointments reviewed; caregiver confirms that family will continue transporting patient to appointments; confirms that patient continues going to all (established) HD sessionsas scheduled.  Reports to attend ongoing pain management clinic appointment next Tuesday for "an injection."  Daughter/caregiverdeniesfurther issues, concerns, or problems today. Confirmed that patient/ caregivershavemy direct phone number, the main [facility] CM office phone number, and the [facility] CM 24-hour nurse advice phone number should issues arise prior to next scheduled [place] outreach by phone in 3 weeks. Encouraged patient/ caregiversto contact me directly if needs, questions, issues, or concerns arise prior to next scheduled outreach.  Plan:  Patient will take medications as prescribed and will  attend all scheduled provider appointments  Patient will continue using assistive devices for fall prevention  Patient/ caregivers willcontinuereviewing previously providededucational material, as indicated around patient's clinical condition  Patient/ caregiver will promptly notify care providers for any new concerns, issues, or problems that arise  [facility] CMoutreachto continue withscheduledtelephone outreachin 3 weeks  [facility] CM Care Plan Problem Two     Most Recent Value  Care Plan Problem Two  Ongoing management of chronic pain, as previously evidenced by patient reporting, and confirmation of same today by caregiver reporting  Role Documenting the Problem Two  Care Management [place] for Problem Two  Not Active  [facility] CM Short Term Goal #1   Over the next 30 days, patient will attend scheduled radiology appointments post-pain management provider appointment, as evidenced by patient/ caregiver reporting and review of EMR during [facility] RN CCM outreach  [facility] CM Short Term Goal #1 Start Date  05/27/17  [employer] [facility] CM Short Term Goal #1 Met Date   06/16/17-- Goal met    [facility] CM Care Plan Problem Three     Most Recent Value  Care Plan Problem Three  Self-health management deficit of chronic disease state of A-Fib and recurrent syncope, as evidenced by patient/ caregiver reporting post- recent hospital visit  Role Documenting the Problem Three  Care Management [place] for Problem Three  Active  [facility] Long Term Goal   Over the next 50 days, patient/ caregiver will be able to verbalize signs/ symptoms of pre- syncope, low BP, along with action plan for same, as evidenced by patient/caregiver reporting during [facility] RN CCM outreach  [facility] Long Term Goal Start Date  06/02/17  Interventions for Problem Three Long Term Goal  Discussed with patient's caregiver recent cardiology office visit where patient's medications were changed,  confirmed with caregiver that patient is  taking medications as prescribed, and that caregiver is able to verbalize accurate understanding of new medication instructions,  discussed patient's current clinical status with caregiver, and reiterated action plan for A-Fib, signs/ symptoms low BP,  encouraged caregiver to monitor/ record patient's BP's at home with recent changes made to medications     [name], RN, BSN, [place] Coordinator [facility] Care Management  [phone]

## 2017-07-01 DIAGNOSIS — N186 End stage renal disease: Secondary | ICD-10-CM | POA: Diagnosis not present

## 2017-07-01 DIAGNOSIS — N2581 Secondary hyperparathyroidism of renal origin: Secondary | ICD-10-CM | POA: Diagnosis not present

## 2017-07-01 DIAGNOSIS — E1129 Type 2 diabetes mellitus with other diabetic kidney complication: Secondary | ICD-10-CM | POA: Diagnosis not present

## 2017-07-01 DIAGNOSIS — D631 Anemia in chronic kidney disease: Secondary | ICD-10-CM | POA: Diagnosis not present

## 2017-07-04 DIAGNOSIS — M79604 Pain in right leg: Secondary | ICD-10-CM | POA: Diagnosis not present

## 2017-07-04 DIAGNOSIS — N186 End stage renal disease: Secondary | ICD-10-CM | POA: Diagnosis not present

## 2017-07-04 DIAGNOSIS — M545 Low back pain: Secondary | ICD-10-CM | POA: Diagnosis not present

## 2017-07-04 DIAGNOSIS — D631 Anemia in chronic kidney disease: Secondary | ICD-10-CM | POA: Diagnosis not present

## 2017-07-04 DIAGNOSIS — M961 Postlaminectomy syndrome, not elsewhere classified: Secondary | ICD-10-CM | POA: Diagnosis not present

## 2017-07-04 DIAGNOSIS — N2581 Secondary hyperparathyroidism of renal origin: Secondary | ICD-10-CM | POA: Diagnosis not present

## 2017-07-04 DIAGNOSIS — E1129 Type 2 diabetes mellitus with other diabetic kidney complication: Secondary | ICD-10-CM | POA: Diagnosis not present

## 2017-07-05 ENCOUNTER — Other Ambulatory Visit: Payer: Self-pay | Admitting: Cardiovascular Disease

## 2017-07-05 DIAGNOSIS — E1122 Type 2 diabetes mellitus with diabetic chronic kidney disease: Secondary | ICD-10-CM | POA: Diagnosis not present

## 2017-07-05 DIAGNOSIS — I132 Hypertensive heart and chronic kidney disease with heart failure and with stage 5 chronic kidney disease, or end stage renal disease: Secondary | ICD-10-CM | POA: Diagnosis not present

## 2017-07-05 DIAGNOSIS — I509 Heart failure, unspecified: Secondary | ICD-10-CM | POA: Diagnosis not present

## 2017-07-05 DIAGNOSIS — I951 Orthostatic hypotension: Secondary | ICD-10-CM | POA: Diagnosis not present

## 2017-07-05 DIAGNOSIS — N186 End stage renal disease: Secondary | ICD-10-CM | POA: Diagnosis not present

## 2017-07-05 DIAGNOSIS — M6281 Muscle weakness (generalized): Secondary | ICD-10-CM | POA: Diagnosis not present

## 2017-07-06 ENCOUNTER — Ambulatory Visit (INDEPENDENT_AMBULATORY_CARE_PROVIDER_SITE_OTHER): Payer: Medicare Other | Admitting: Pharmacist Clinician (PhC)/ Clinical Pharmacy Specialist

## 2017-07-06 ENCOUNTER — Other Ambulatory Visit: Payer: Self-pay | Admitting: Pain Medicine

## 2017-07-06 DIAGNOSIS — Z7901 Long term (current) use of anticoagulants: Secondary | ICD-10-CM | POA: Diagnosis not present

## 2017-07-06 DIAGNOSIS — I4891 Unspecified atrial fibrillation: Secondary | ICD-10-CM

## 2017-07-06 DIAGNOSIS — D631 Anemia in chronic kidney disease: Secondary | ICD-10-CM | POA: Diagnosis not present

## 2017-07-06 DIAGNOSIS — N186 End stage renal disease: Secondary | ICD-10-CM | POA: Diagnosis not present

## 2017-07-06 DIAGNOSIS — M545 Low back pain: Secondary | ICD-10-CM

## 2017-07-06 DIAGNOSIS — E1129 Type 2 diabetes mellitus with other diabetic kidney complication: Secondary | ICD-10-CM | POA: Diagnosis not present

## 2017-07-06 DIAGNOSIS — M79604 Pain in right leg: Secondary | ICD-10-CM

## 2017-07-06 DIAGNOSIS — N2581 Secondary hyperparathyroidism of renal origin: Secondary | ICD-10-CM | POA: Diagnosis not present

## 2017-07-06 LAB — POCT INR: INR: 3.2

## 2017-07-06 NOTE — Telephone Encounter (Signed)
Please review for refill. Thanks!  

## 2017-07-06 NOTE — Telephone Encounter (Signed)
REFILL 

## 2017-07-06 NOTE — Patient Instructions (Signed)
Description   HOLD warfarin dose today Wednesday April 10; then decrease dose to 1/2 tablet daily except 1 tablet each Tuesday, Thursday and Saturday.  Repeat INR in 2 week

## 2017-07-07 DIAGNOSIS — M6281 Muscle weakness (generalized): Secondary | ICD-10-CM | POA: Diagnosis not present

## 2017-07-07 DIAGNOSIS — E1122 Type 2 diabetes mellitus with diabetic chronic kidney disease: Secondary | ICD-10-CM | POA: Diagnosis not present

## 2017-07-07 DIAGNOSIS — I951 Orthostatic hypotension: Secondary | ICD-10-CM | POA: Diagnosis not present

## 2017-07-07 DIAGNOSIS — N186 End stage renal disease: Secondary | ICD-10-CM | POA: Diagnosis not present

## 2017-07-07 DIAGNOSIS — I132 Hypertensive heart and chronic kidney disease with heart failure and with stage 5 chronic kidney disease, or end stage renal disease: Secondary | ICD-10-CM | POA: Diagnosis not present

## 2017-07-07 DIAGNOSIS — I509 Heart failure, unspecified: Secondary | ICD-10-CM | POA: Diagnosis not present

## 2017-07-08 DIAGNOSIS — E1129 Type 2 diabetes mellitus with other diabetic kidney complication: Secondary | ICD-10-CM | POA: Diagnosis not present

## 2017-07-08 DIAGNOSIS — N2581 Secondary hyperparathyroidism of renal origin: Secondary | ICD-10-CM | POA: Diagnosis not present

## 2017-07-08 DIAGNOSIS — N186 End stage renal disease: Secondary | ICD-10-CM | POA: Diagnosis not present

## 2017-07-08 DIAGNOSIS — D631 Anemia in chronic kidney disease: Secondary | ICD-10-CM | POA: Diagnosis not present

## 2017-07-11 DIAGNOSIS — D631 Anemia in chronic kidney disease: Secondary | ICD-10-CM | POA: Diagnosis not present

## 2017-07-11 DIAGNOSIS — N2581 Secondary hyperparathyroidism of renal origin: Secondary | ICD-10-CM | POA: Diagnosis not present

## 2017-07-11 DIAGNOSIS — E1129 Type 2 diabetes mellitus with other diabetic kidney complication: Secondary | ICD-10-CM | POA: Diagnosis not present

## 2017-07-11 DIAGNOSIS — N186 End stage renal disease: Secondary | ICD-10-CM | POA: Diagnosis not present

## 2017-07-12 DIAGNOSIS — I951 Orthostatic hypotension: Secondary | ICD-10-CM | POA: Diagnosis not present

## 2017-07-12 DIAGNOSIS — M6281 Muscle weakness (generalized): Secondary | ICD-10-CM | POA: Diagnosis not present

## 2017-07-12 DIAGNOSIS — N186 End stage renal disease: Secondary | ICD-10-CM | POA: Diagnosis not present

## 2017-07-12 DIAGNOSIS — I132 Hypertensive heart and chronic kidney disease with heart failure and with stage 5 chronic kidney disease, or end stage renal disease: Secondary | ICD-10-CM | POA: Diagnosis not present

## 2017-07-12 DIAGNOSIS — E1122 Type 2 diabetes mellitus with diabetic chronic kidney disease: Secondary | ICD-10-CM | POA: Diagnosis not present

## 2017-07-12 DIAGNOSIS — I509 Heart failure, unspecified: Secondary | ICD-10-CM | POA: Diagnosis not present

## 2017-07-13 DIAGNOSIS — N186 End stage renal disease: Secondary | ICD-10-CM | POA: Diagnosis not present

## 2017-07-13 DIAGNOSIS — D631 Anemia in chronic kidney disease: Secondary | ICD-10-CM | POA: Diagnosis not present

## 2017-07-13 DIAGNOSIS — N2581 Secondary hyperparathyroidism of renal origin: Secondary | ICD-10-CM | POA: Diagnosis not present

## 2017-07-13 DIAGNOSIS — E1129 Type 2 diabetes mellitus with other diabetic kidney complication: Secondary | ICD-10-CM | POA: Diagnosis not present

## 2017-07-14 ENCOUNTER — Ambulatory Visit
Admission: RE | Admit: 2017-07-14 | Discharge: 2017-07-14 | Disposition: A | Discharge status: Home / Self Care | Payer: Medicare Other | Source: Ambulatory Visit | Attending: Pain Medicine | Admitting: Pain Medicine

## 2017-07-14 ENCOUNTER — Other Ambulatory Visit: Payer: Self-pay | Admitting: Cardiovascular Disease

## 2017-07-14 ENCOUNTER — Other Ambulatory Visit: Payer: Medicare Other

## 2017-07-14 DIAGNOSIS — I509 Heart failure, unspecified: Secondary | ICD-10-CM | POA: Diagnosis not present

## 2017-07-14 DIAGNOSIS — M79604 Pain in right leg: Secondary | ICD-10-CM

## 2017-07-14 DIAGNOSIS — M6281 Muscle weakness (generalized): Secondary | ICD-10-CM | POA: Diagnosis not present

## 2017-07-14 DIAGNOSIS — M545 Low back pain: Secondary | ICD-10-CM

## 2017-07-14 DIAGNOSIS — N186 End stage renal disease: Secondary | ICD-10-CM | POA: Diagnosis not present

## 2017-07-14 DIAGNOSIS — I951 Orthostatic hypotension: Secondary | ICD-10-CM | POA: Diagnosis not present

## 2017-07-14 DIAGNOSIS — E1122 Type 2 diabetes mellitus with diabetic chronic kidney disease: Secondary | ICD-10-CM | POA: Diagnosis not present

## 2017-07-14 DIAGNOSIS — I132 Hypertensive heart and chronic kidney disease with heart failure and with stage 5 chronic kidney disease, or end stage renal disease: Secondary | ICD-10-CM | POA: Diagnosis not present

## 2017-07-14 NOTE — Telephone Encounter (Signed)
Refill Request.  

## 2017-07-15 DIAGNOSIS — D631 Anemia in chronic kidney disease: Secondary | ICD-10-CM | POA: Diagnosis not present

## 2017-07-15 DIAGNOSIS — N2581 Secondary hyperparathyroidism of renal origin: Secondary | ICD-10-CM | POA: Diagnosis not present

## 2017-07-15 DIAGNOSIS — E1129 Type 2 diabetes mellitus with other diabetic kidney complication: Secondary | ICD-10-CM | POA: Diagnosis not present

## 2017-07-15 DIAGNOSIS — N186 End stage renal disease: Secondary | ICD-10-CM | POA: Diagnosis not present

## 2017-07-18 DIAGNOSIS — N2581 Secondary hyperparathyroidism of renal origin: Secondary | ICD-10-CM | POA: Diagnosis not present

## 2017-07-18 DIAGNOSIS — N186 End stage renal disease: Secondary | ICD-10-CM | POA: Diagnosis not present

## 2017-07-18 DIAGNOSIS — E1129 Type 2 diabetes mellitus with other diabetic kidney complication: Secondary | ICD-10-CM | POA: Diagnosis not present

## 2017-07-18 DIAGNOSIS — D631 Anemia in chronic kidney disease: Secondary | ICD-10-CM | POA: Diagnosis not present

## 2017-07-19 ENCOUNTER — Telehealth: Payer: Self-pay | Admitting: Pulmonary Disease

## 2017-07-19 DIAGNOSIS — G894 Chronic pain syndrome: Secondary | ICD-10-CM | POA: Diagnosis not present

## 2017-07-19 DIAGNOSIS — I509 Heart failure, unspecified: Secondary | ICD-10-CM | POA: Diagnosis not present

## 2017-07-19 DIAGNOSIS — M47816 Spondylosis without myelopathy or radiculopathy, lumbar region: Secondary | ICD-10-CM | POA: Diagnosis not present

## 2017-07-19 DIAGNOSIS — I132 Hypertensive heart and chronic kidney disease with heart failure and with stage 5 chronic kidney disease, or end stage renal disease: Secondary | ICD-10-CM | POA: Diagnosis not present

## 2017-07-19 DIAGNOSIS — M545 Low back pain: Secondary | ICD-10-CM | POA: Diagnosis not present

## 2017-07-19 DIAGNOSIS — M6281 Muscle weakness (generalized): Secondary | ICD-10-CM | POA: Diagnosis not present

## 2017-07-19 DIAGNOSIS — M79604 Pain in right leg: Secondary | ICD-10-CM | POA: Diagnosis not present

## 2017-07-19 DIAGNOSIS — N186 End stage renal disease: Secondary | ICD-10-CM | POA: Diagnosis not present

## 2017-07-19 DIAGNOSIS — I951 Orthostatic hypotension: Secondary | ICD-10-CM | POA: Diagnosis not present

## 2017-07-19 DIAGNOSIS — E1122 Type 2 diabetes mellitus with diabetic chronic kidney disease: Secondary | ICD-10-CM | POA: Diagnosis not present

## 2017-07-19 MED ORDER — TIOTROPIUM BROMIDE-OLODATEROL 2.5-2.5 MCG/ACT IN AERS: 2.0000 | INHALATION_SPRAY | Freq: Every day | RESPIRATORY_TRACT | 5 refills | Status: DC

## 2017-07-19 NOTE — Telephone Encounter (Signed)
Spoke with [name], she was requesting a refill on patient's Stiolto to be sent to [facility] on [address]. Advised her that I would send in RX.   Nothing else needed at time of call.

## 2017-07-20 ENCOUNTER — Ambulatory Visit (INDEPENDENT_AMBULATORY_CARE_PROVIDER_SITE_OTHER): Payer: Medicare Other | Admitting: Pharmacist

## 2017-07-20 ENCOUNTER — Encounter: Payer: Self-pay | Admitting: *Deleted

## 2017-07-20 ENCOUNTER — Other Ambulatory Visit: Payer: Self-pay | Admitting: *Deleted

## 2017-07-20 DIAGNOSIS — Z7901 Long term (current) use of anticoagulants: Secondary | ICD-10-CM | POA: Diagnosis not present

## 2017-07-20 DIAGNOSIS — D631 Anemia in chronic kidney disease: Secondary | ICD-10-CM | POA: Diagnosis not present

## 2017-07-20 DIAGNOSIS — E1129 Type 2 diabetes mellitus with other diabetic kidney complication: Secondary | ICD-10-CM | POA: Diagnosis not present

## 2017-07-20 DIAGNOSIS — I4891 Unspecified atrial fibrillation: Secondary | ICD-10-CM | POA: Diagnosis not present

## 2017-07-20 DIAGNOSIS — N2581 Secondary hyperparathyroidism of renal origin: Secondary | ICD-10-CM | POA: Diagnosis not present

## 2017-07-20 DIAGNOSIS — I482 Chronic atrial fibrillation: Secondary | ICD-10-CM | POA: Diagnosis not present

## 2017-07-20 DIAGNOSIS — N186 End stage renal disease: Secondary | ICD-10-CM | POA: Diagnosis not present

## 2017-07-20 LAB — POCT INR: INR: 2.9

## 2017-07-20 NOTE — Patient Instructions (Signed)
Description   Continue 1/2 tablet daily except 1 tablet each Tuesday, Thursday and Saturday.  Repeat INR in 3 week

## 2017-07-20 NOTE — Patient Outreach (Signed)
[place] [facility]) Care Management [place] Telephone Outreach/ Case Closure  07/20/2017  Alexander [name] [birth year] [id number]  Successful telephone outreach toKaren Molinda Bailiff, caregiver/ daughter, on [facility] CM written consent, ofFred C [name],77 y.o.maleoriginallyreferred to [place] from [facility] telephonic RN CM on original MD referral. Patient has history including, but not limited to, PAD, Aortic stenosis with TAVR 08/31/16, ESRD on HD (M, W, F), A-Fib on coumadin, DM, CVA in 2004, CHF, macular degeneration with legal blindness, and multiple falls. Patient presented to hospital 11/23/16 for chest pain and general malaise,sepsis, and A-fib with RVR, and was discharged to SNF for rehabilitation.Patient was subsequently discharged home from [facility] March 11, 2017 to self-care without home health services in place.  Patient experienced new hospital admission February 14-16, 2019 for near-syncope/ fall/ minor head injury; patient was discharged home to self-care with home health services in place.HIPAA/ identity verified with patient's daughtertoday.  Today,caregiverreports thatpatientis "doingreally well and in better spirits, but is sometimes ornery, as usual." Caregiver reports that patient is not in distress, and she states that his pain "has not changed and is the same as it always is," and that patient has not reported any recent dizzy spells, shortness of breath, syncope, or recent falls.Caregiver denies concerns around patient current clinical condition.  Reports patient has obtained and 'really likes" his newly acquired walker, stating it "is easier to manage, allows him to stand up straighter, and is lighter to carry when he goes out."  Confirmed with caregiver that patient has attended all recent provider appointments, HD sessions as scheduled/ established, and that she is able to verbalize action plan for A-Fib/ pre-syncope.  Discussed with  caregiver that patient has thus far met his previously established [place] CM goals, and she denies further care coordination needs.  Reports accurate understanding of upcoming provider appointments and confirms that patient's spouse or she herself will continue providing transportation.  Daughter/caregiverdeniesfurther issues, concerns, or problems today, and she agrees with [facility] CM case closure; declines [place] referral, stating that patient "absolutely hates talking on the phone."Confirmed that patient/ caregivershavemy direct phone number, the main [facility] CM office phone number, and the [facility] CM 24-hour nurse advice phone number should issues arise in the future.  Plan:  Will close [facility], as patient/ caregivers have successfully met previously established goals and voice no further care coordination needs; will make patient's PCP aware of same.  [facility] CM Care Plan Problem Three     Most Recent Value  Care Plan Problem Three  Self-health management deficit of chronic disease state of A-Fib and recurrent syncope, as evidenced by patient/ caregiver reporting post- recent hospital visit  Role Documenting the Problem Three  Care Management [place] for Problem Three  Not Active  [facility] Long Term Goal   Over the next 50 days, patient/ caregiver will be able to verbalize signs/ symptoms of pre- syncope, low BP, along with action plan for same, as evidenced by patient/caregiver reporting during [facility] RN CCM outreach  [facility] Long Term Goal Start Date  06/02/17  [facility] Long Term Goal Met Date  07/20/17 - Goal met  Interventions for Problem Three Long Term Goal  Confirmed that patient's caregiver is able to verbalize action plan for A-Fib, pre-syncope,  obtained clinical update from caregiver and confirmed that caregiver agrees with [facility] CCM case closure     It has been a pleasure caring for Alexander [name], [place] Jamse Arn, RN, BSN,  [place] Care Management  [phone]

## 2017-07-21 DIAGNOSIS — I951 Orthostatic hypotension: Secondary | ICD-10-CM | POA: Diagnosis not present

## 2017-07-21 DIAGNOSIS — I509 Heart failure, unspecified: Secondary | ICD-10-CM | POA: Diagnosis not present

## 2017-07-21 DIAGNOSIS — I132 Hypertensive heart and chronic kidney disease with heart failure and with stage 5 chronic kidney disease, or end stage renal disease: Secondary | ICD-10-CM | POA: Diagnosis not present

## 2017-07-21 DIAGNOSIS — E1122 Type 2 diabetes mellitus with diabetic chronic kidney disease: Secondary | ICD-10-CM | POA: Diagnosis not present

## 2017-07-21 DIAGNOSIS — N186 End stage renal disease: Secondary | ICD-10-CM | POA: Diagnosis not present

## 2017-07-21 DIAGNOSIS — M6281 Muscle weakness (generalized): Secondary | ICD-10-CM | POA: Diagnosis not present

## 2017-07-21 LAB — PROTIME-INR: INR: 2.4 — AB (ref 0.9–1.1)

## 2017-07-22 DIAGNOSIS — N186 End stage renal disease: Secondary | ICD-10-CM | POA: Diagnosis not present

## 2017-07-22 DIAGNOSIS — N2581 Secondary hyperparathyroidism of renal origin: Secondary | ICD-10-CM | POA: Diagnosis not present

## 2017-07-22 DIAGNOSIS — E1129 Type 2 diabetes mellitus with other diabetic kidney complication: Secondary | ICD-10-CM | POA: Diagnosis not present

## 2017-07-22 DIAGNOSIS — D631 Anemia in chronic kidney disease: Secondary | ICD-10-CM | POA: Diagnosis not present

## 2017-07-25 DIAGNOSIS — N2581 Secondary hyperparathyroidism of renal origin: Secondary | ICD-10-CM | POA: Diagnosis not present

## 2017-07-25 DIAGNOSIS — I132 Hypertensive heart and chronic kidney disease with heart failure and with stage 5 chronic kidney disease, or end stage renal disease: Secondary | ICD-10-CM | POA: Diagnosis not present

## 2017-07-25 DIAGNOSIS — M6281 Muscle weakness (generalized): Secondary | ICD-10-CM | POA: Diagnosis not present

## 2017-07-25 DIAGNOSIS — I951 Orthostatic hypotension: Secondary | ICD-10-CM | POA: Diagnosis not present

## 2017-07-25 DIAGNOSIS — I509 Heart failure, unspecified: Secondary | ICD-10-CM | POA: Diagnosis not present

## 2017-07-25 DIAGNOSIS — N186 End stage renal disease: Secondary | ICD-10-CM | POA: Diagnosis not present

## 2017-07-25 DIAGNOSIS — D631 Anemia in chronic kidney disease: Secondary | ICD-10-CM | POA: Diagnosis not present

## 2017-07-25 DIAGNOSIS — E1129 Type 2 diabetes mellitus with other diabetic kidney complication: Secondary | ICD-10-CM | POA: Diagnosis not present

## 2017-07-25 DIAGNOSIS — E1122 Type 2 diabetes mellitus with diabetic chronic kidney disease: Secondary | ICD-10-CM | POA: Diagnosis not present

## 2017-07-26 ENCOUNTER — Ambulatory Visit (INDEPENDENT_AMBULATORY_CARE_PROVIDER_SITE_OTHER): Payer: Medicare Other | Admitting: Pulmonary Disease

## 2017-07-26 ENCOUNTER — Encounter: Payer: Self-pay | Admitting: Pulmonary Disease

## 2017-07-26 VITALS — BP 132/80 | HR 65 | Ht 70.5 in | Wt 197.0 lb

## 2017-07-26 DIAGNOSIS — J439 Emphysema, unspecified: Secondary | ICD-10-CM | POA: Diagnosis not present

## 2017-07-26 MED ORDER — TIOTROPIUM BROMIDE-OLODATEROL 2.5-2.5 MCG/ACT IN AERS: 2.0000 | INHALATION_SPRAY | Freq: Every day | RESPIRATORY_TRACT | 5 refills | Status: DC

## 2017-07-26 NOTE — Progress Notes (Signed)
Alexander [name]    [id number]    1941/03/28  Primary Care Physician:Pharr, Thayer Jew, MD  Referring Physician: Deland Pretty, MD [address] [place] [place] [address] [place], [place] [address]  Chief complaint:  Follow up for dyspnea, emphysema  HPI: 77 year old with multiple medical issues including anemia, aortic stenosis status post TAVR, Barrett's esophagus, carpal tunnel syndrome, proximal atrial fibrillation, CVA, type 2 diabetes, end-stage renal disease, legal blindness due to macular degeneration. Here for evaluation of dyspnea over the past 6-8 months. He reports symptoms with activity and at rest, productive cough, no wheezing, fevers or chills  History noted for TAVR procedure in June 2018. The procedure improved his breathing temporarily but is now back to baseline. He had an admission after the procedure for volume overload requiring extra dialysis. Notes from [name], cardiologist mentioned elevated BNP and difficulty keeping his dry weight down with the dialysis as an outpatient. He was also admitted in the end of August 2018 for streptococcal bacteremia. TEE at that time did not show any evidence of endocarditis. He had been briefly on amiodarone from December 2016 to November of 2017 for paroxysmal atrial fibrillation. He is not on any nodal agents secondary to bradycardia.   More recently he developed arthritis symptoms and is on low-dose prednisone at 5 mg for few months.  Per review of the primary care office notes his arthritis is thought to be pseudogout. He has been evaluated on 09/13/15 by Dr. Dossie Der, Rheumatology .for hand pain which was felt more to be neuropathic, likely related to carpal tunnel syndrome versus uremia.  So he was referred to neurologist. He had a nerve conduction study which showed combination of neuropathy and carpal tunnel. He is on chronic prednisone 5 mg since September 2017  with good control of his pseudogout.  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 No mold at home. Smoking history: 3 packs per day for 45 years. Quit in 1999 Travel History: Not significant  Interim history: He continues on stiolto inhaler.  States that dyspnea on exertion is at baseline without change.  Continues on dialysis for his kidney disease.   Outpatient Encounter Medications as of 07/26/2017  Medication Sig  . acetaminophen (TYLENOL) 325 MG tablet Take 2 tablets (650 mg total) by mouth every 6 (six) hours as needed for mild pain. (Patient taking differently: Take 650 mg by mouth every 6 (six) hours. )  . b complex-vitamin c-folic acid (NEPHRO-VITE) 0.8 MG TABS Take 1 tablet by mouth daily.   . bromocriptine (PARLODEL) 5 MG capsule Take 5 mg by mouth at bedtime.   . cetirizine (ZYRTEC) 10 MG tablet Take 10 mg by mouth at bedtime.   . cinacalcet (SENSIPAR) 30 MG tablet Take 30 mg by mouth every evening.   . clindamycin (CLEOCIN) 150 MG capsule Take 13 capsules (1,950 mg total) by mouth See admin instructions. Take 2,000 mg by mouth one hour prior to dental procedure  . digoxin (LANOXIN) 0.125 MG tablet Take 0.5 tablets (0.0625 mg total) by mouth daily.  [name] diltiazem (CARDIZEM CD) 120 MG 24 hr capsule Take 1 capsule (120 mg total) by mouth daily.  . dorzolamide-timolol (COSOPT) 22.3-6.8 MG/ML ophthalmic solution Place 1 drop into both eyes 2 (two) times daily.   . methocarbamol (ROBAXIN) 500 MG tablet Take 1 tablet (500 mg total) by  mouth every 6 (six) hours as needed for muscle spasms.  . midodrine (PROAMATINE) 10 MG tablet Take 10 mg by mouth See admin instructions. 10 mg by mouth prior to dialysis and an extra 5 mg as needed for low b/p prior to dialysis (may also take 15 mg as needed for low b/p during dialysis)  . midodrine (PROAMATINE) 5 MG tablet Take 5-15 mg by mouth daily as needed (for a sudden  drop in blood pressure).   . midodrine (PROAMATINE) 5 MG tablet TAKE 3 TABLETS BY MOUTH THREE TIMES DAILY AS NEEDED.  [name] Nutritional Supplements (NEPRO) LIQD Take 237 mLs by mouth every Monday, Wednesday, and Friday with hemodialysis.   [name] omeprazole (PRILOSEC) 20 MG capsule Take 20 mg by mouth daily.  . polyethylene glycol (MIRALAX / GLYCOLAX) packet Take 17 g by mouth daily.  . pravastatin (PRAVACHOL) 10 MG tablet Take 1 tablet (10 mg total) by mouth daily.  . predniSONE (DELTASONE) 5 MG tablet Take 5 mg by mouth at bedtime.   . senna-docusate (SENOKOT-S) 8.6-50 MG tablet Take 1 tablet by mouth at bedtime. (Patient taking differently: Take 1 tablet by mouth at bedtime as needed for mild constipation. )  . sertraline (ZOLOFT) 50 MG tablet Take 50 mg by mouth daily.  . sevelamer carbonate (RENVELA) 800 MG tablet Take 800 mg by mouth 3 (three) times daily with meals.  . Tiotropium Bromide-Olodaterol (STIOLTO RESPIMAT) 2.5-2.5 MCG/ACT AERS Inhale 2 puffs into the lungs daily.  [name] warfarin (COUMADIN) 5 MG tablet Take 1-1.5 tablets by mouth daily as directed by coumadin clinic (Patient taking differently: Take 2.5-5 mg by mouth See admin instructions. 5 mg by mouth at night on Sun/Tues/Thurs/Sat and 2.5 mg on Mon/Wed/Fri)  . [DISCONTINUED] ferric citrate (AURYXIA) 1 GM 210 MG(Fe) tablet Take 420 mg by mouth 3 (three) times daily with meals.   No facility-administered encounter medications on file as of 07/26/2017.     Allergies as of 07/26/2017 - Review Complete 07/26/2017  Allergen Reaction Noted  . Penicillins Swelling and Rash 01/21/2011  . Atorvastatin  06/13/2017  . Codeine Nausea Only 06/27/2014  . Tramadol Nausea Only 06/27/2014    Past Medical History:  Diagnosis Date  . Anemia   . Aortic stenosis 06/15/12   TEE - EF 55-73%; grade 1 diastolic dysfunction; mild/mod aortic valve stenosis; Mitral valve had calcified annulus, mild pulm htn PA peak pressure 63mmHg  . Barrett's esophagus  05/2003  . Bradycardia 2017   [employer] 2240 Assurity dual-lead pacemaker  . Carpal tunnel syndrome, bilateral 11/03/2015  . Colon polyps   . CVA (cerebral infarction)    2004/affected left side  . Depression   . Diabetes mellitus without complication ([place])   . Diabetic peripheral neuropathy ([place]) 10/02/2015  . Diverticulosis   . Dyspnea    with exertion  . ESRD (end stage renal disease) on dialysis ([place])    "[facility]; [place]; MWF" (05/12/2017)  . GERD (gastroesophageal reflux disease)   . Glaucoma   . History of kidney stones   . Hyperlipidemia   . Hypertension   . Legally blind   . Macular degeneration    both eyes  . Orthostatic hypotension 09/09/2015  . Paroxysmal atrial fibrillation ([facility])   . Peptic ulcer    bleeding, 1969  . Presence of permanent cardiac pacemaker   . S/P epidural steroid injection    last  injection over 10 years ago  . Seasonal allergies   . Tubular adenoma of colon 07/2001  Past Surgical History:  Procedure Laterality Date  . AV FISTULA PLACEMENT  2009  . BACK SURGERY    . CARDIOVERSION N/A 11/13/2015   Procedure: CARDIOVERSION;  Surgeon: Troy Sine, MD;  Location: [facility] ENDOSCOPY;  Service: Cardiovascular;  Laterality: N/A;  . CARDIOVERSION N/A 01/13/2016   Procedure: CARDIOVERSION;  Surgeon: Will Meredith [name], MD;  Location: [facility];  Service: Cardiovascular;  Laterality: N/A;  . [place]   right eye  . CYSTOSCOPY  several times   kidney stones  . EP IMPLANTABLE DEVICE N/A 03/11/2015   Procedure: Pacemaker Implant;  Surgeon: Will Meredith [name], MD;  [place];  Laterality: Left  . LAMINECTOMY  1969  . RIGHT/LEFT HEART CATH AND CORONARY ANGIOGRAPHY N/A 08/20/2016   Procedure: Right/Left Heart Cath and Coronary Angiography;  Surgeon: Burnell Blanks, MD;  Location: [facility] [facility] CV LAB;  Service: Cardiovascular;  Laterality: N/A;  . TEE WITHOUT CARDIOVERSION N/A 07/22/2016   Procedure:  TRANSESOPHAGEAL ECHOCARDIOGRAM (TEE);  Surgeon: Skeet Latch, MD;  Location: [facility];  Service: Cardiovascular;  Laterality: N/A;  . TEE WITHOUT CARDIOVERSION N/A 08/31/2016   Procedure: TRANSESOPHAGEAL ECHOCARDIOGRAM (TEE);  Surgeon: Burnell Blanks, MD;  Location: [facility];  Service: Open Heart Surgery;  Laterality: N/A;  . TEE WITHOUT CARDIOVERSION N/A 12/01/2016   Procedure: TRANSESOPHAGEAL ECHOCARDIOGRAM (TEE);  Surgeon: Larey Dresser, MD;  Location: [facility] ENDOSCOPY;  Service: Cardiovascular;  Laterality: N/A;  . TONSILLECTOMY  1964  . TRANSCATHETER AORTIC VALVE REPLACEMENT, TRANSFEMORAL N/A 08/31/2016   Procedure: TRANSCATHETER AORTIC VALVE REPLACEMENT, TRANSFEMORAL;  Surgeon: Burnell Blanks, MD;  Location: [facility];  Service: Open Heart Surgery;  Laterality: N/A;    Family History  Problem Relation Age of Onset  . Stomach cancer Mother   . Hypertension Father        Died of heart attack  . Heart attack Father   . Stroke Sister   . Heart disease Sister   . Cancer Brother   . Colon cancer Neg Hx     Social History   Socioeconomic History  . Marital status: Married    Spouse name: Not on file  . Number of children: 3  . Years of education: 26  . Highest education level: Not on file  Occupational History  . 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 Financial resource strain: Not on file  . Food insecurity:    Worry: Not on file    Inability: Not on file  . Transportation needs:    Medical: Not on file    Non-medical: Not on file  Tobacco Use  . Smoking status: Former Smoker    Packs/day: 2.00    Years: 45.00    Pack years: 90.00    Types: Cigarettes    Last attempt to quit: 01/20/1998    Years since quitting: 19.5  . Smokeless tobacco: Never Used  Substance and Sexual Activity  . Alcohol use: No    Alcohol/week: 0.0 oz  . Drug use: No  . Sexual activity: Yes  Lifestyle  . Physical activity:    Days per week: Not  on file    Minutes per session: Not on file  . Stress: Not on file  Relationships  . Social connections:    Talks on phone: Not on file    Gets together: Not on file    Attends religious service: Not on file    Active member of club or organization:  Not on file    Attends meetings of clubs or organizations: Not on file    Relationship status: Not on file  . Intimate partner violence:    Fear of current or ex partner: Not on file    Emotionally abused: Not on file    Physically abused: Not on file    Forced sexual activity: Not on file  Other Topics Concern  . Not on file  Social History Narrative   Lives at home w/ his wife   Right-handed   Drinks 1 cup of coffee per day    Review of systems: Review of Systems  Constitutional: Negative for fever and chills.  HENT: Negative.   Eyes: Negative for blurred vision.  Respiratory: as per HPI  Cardiovascular: Negative for chest pain and palpitations.  Gastrointestinal: Negative for vomiting, diarrhea, blood per rectum. Genitourinary: Negative for dysuria, urgency, frequency and hematuria.  Musculoskeletal: Negative for myalgias, back pain and joint pain.  Skin: Negative for itching and rash.  Neurological: Negative for dizziness, tremors, focal weakness, seizures and loss of consciousness.  Endo/Heme/Allergies: Negative for environmental allergies.  Psychiatric/Behavioral: Negative for depression, suicidal ideas and hallucinations.  All other systems reviewed and are negative.  Physical Exam: Blood pressure 132/80, pulse 65, height 5' 10.5" (1.791 m), weight 197 lb (89.4 kg), SpO2 98 %. Gen:      No acute distress HEENT:  EOMI, sclera anicteric Neck:     No masses; no thyromegaly Lungs:    Clear to auscultation bilaterally; normal respiratory effort CV:         Regular rate and rhythm; no murmurs Abd:      + bowel sounds; soft, non-tender; no palpable masses, no distension Ext:    No edema; adequate peripheral perfusion Skin:       Warm and dry; no rash Neuro: alert and oriented x 3 Psych: normal mood and affect  Data Reviewed:  CT abdomen pelvis 05/23/03-clear lung bases. CT abdomen pelvis 01/28/11-reticular opacities, atelectasis at the lung bases CT chest 04/03/14-left apical bulla, emphysematous changes, reticular opacities at the lung bases CT chest 10/05/15-left apical bulla emphysematous changes, reticular opacities at the lung bases CT chest 08/26/16- left apical bulla, emphysematous changes. basal subpleural opacities CT chest 11/23/16-left apical bulla, emphysematous changes, reticular opacities/atelectasis predominantly at the lung bases. CT resolution 01/04/17-no interstitial lung disease, emphysematous changes, coronary atherosclerosis mild CHF Chest x-ray 05/12/2017- clear lungs with no focal infiltrate or effusion. I have reviewed the images personally.  PFTs  03/14/14 FVC 4.28 (99%], FEV1 3.10 (99%], TLC 91%, DLCO 84% Minimal obstruction.  08/24/16  FVC 3.28 [78%], FEV1 2.42 (79%],  F/F 74, TLC 76%, DLCO 63% Minimal obstruction, minimal restriction with mild diffusion defect.  Received records from Dr. Shelia Media, primary care physician and Dr. Lahoma Rocker, Rheumatology   Right knee arthrocentesis 11/18/25-cell count 374, positive for CPPD crystal, culture negative Sed rate 62, uric acid 3.3, rheumatoid factor negative, ANA negative, CCP antibody negative Right knee chest x-ray with chondrocalcinosis and osteoarthritis.  Assessment:  Emphysema, dyspnea Multifactorial etiology for dypnea including cardiac issues, volume overload from end-stage renal disease and emphysema. His lung imaging and PFTs show emphysema from significant smoking history.  There is no evidence of significant obstruction on PFTs Clinically he has responded well to stiolto inhaler and will continue the same. No desats on exertion. We will continue to monitor.   Evaluation for interstitial lung disease  No evidence of interstitial  lung disease on high-resolution CT of the chest. There  is evidence of CHF and coronary artery disease on chest imaging.  He follows up with cardiology for this.  Pseudogout Continues on low-dose prednisone at 5 mg  Plan/Recommendations: - Continue Stiolto inhaler   Marshell Garfinkel MD [facility] Pager [phone] 07/26/2017, 11:16 AM  CC: Deland Pretty, MD

## 2017-07-26 NOTE — Addendum Note (Signed)
Addended by: Maryanna Shape A on: 07/26/2017 11:54 AM   Modules accepted: Orders

## 2017-07-26 NOTE — Patient Instructions (Signed)
I am glad you are doing well with your breathing Continue with the Stiolto inhaler.  Will call in a refill for this Follow-up in 6 months.

## 2017-07-27 ENCOUNTER — Ambulatory Visit (INDEPENDENT_AMBULATORY_CARE_PROVIDER_SITE_OTHER): Payer: Medicare Other | Admitting: Pharmacist

## 2017-07-27 DIAGNOSIS — M6281 Muscle weakness (generalized): Secondary | ICD-10-CM | POA: Diagnosis not present

## 2017-07-27 DIAGNOSIS — N186 End stage renal disease: Secondary | ICD-10-CM | POA: Diagnosis not present

## 2017-07-27 DIAGNOSIS — I132 Hypertensive heart and chronic kidney disease with heart failure and with stage 5 chronic kidney disease, or end stage renal disease: Secondary | ICD-10-CM | POA: Diagnosis not present

## 2017-07-27 DIAGNOSIS — Z7901 Long term (current) use of anticoagulants: Secondary | ICD-10-CM | POA: Diagnosis not present

## 2017-07-27 DIAGNOSIS — Z992 Dependence on renal dialysis: Secondary | ICD-10-CM | POA: Diagnosis not present

## 2017-07-27 DIAGNOSIS — I4891 Unspecified atrial fibrillation: Secondary | ICD-10-CM

## 2017-07-27 DIAGNOSIS — E1122 Type 2 diabetes mellitus with diabetic chronic kidney disease: Secondary | ICD-10-CM | POA: Diagnosis not present

## 2017-07-27 DIAGNOSIS — E1129 Type 2 diabetes mellitus with other diabetic kidney complication: Secondary | ICD-10-CM | POA: Diagnosis not present

## 2017-07-27 DIAGNOSIS — D631 Anemia in chronic kidney disease: Secondary | ICD-10-CM | POA: Diagnosis not present

## 2017-07-27 DIAGNOSIS — I509 Heart failure, unspecified: Secondary | ICD-10-CM | POA: Diagnosis not present

## 2017-07-27 DIAGNOSIS — I951 Orthostatic hypotension: Secondary | ICD-10-CM | POA: Diagnosis not present

## 2017-07-27 DIAGNOSIS — N2581 Secondary hyperparathyroidism of renal origin: Secondary | ICD-10-CM | POA: Diagnosis not present

## 2017-07-27 LAB — POCT INR: INR: 7.2

## 2017-07-27 NOTE — Patient Instructions (Signed)
HOLD warfarin today 5/1 and tomorrow 5/2. Eats greens today.  Repeat INR in 3 days

## 2017-07-28 DIAGNOSIS — M6281 Muscle weakness (generalized): Secondary | ICD-10-CM | POA: Diagnosis not present

## 2017-07-28 DIAGNOSIS — I509 Heart failure, unspecified: Secondary | ICD-10-CM | POA: Diagnosis not present

## 2017-07-28 DIAGNOSIS — I132 Hypertensive heart and chronic kidney disease with heart failure and with stage 5 chronic kidney disease, or end stage renal disease: Secondary | ICD-10-CM | POA: Diagnosis not present

## 2017-07-28 DIAGNOSIS — I951 Orthostatic hypotension: Secondary | ICD-10-CM | POA: Diagnosis not present

## 2017-07-28 DIAGNOSIS — N186 End stage renal disease: Secondary | ICD-10-CM | POA: Diagnosis not present

## 2017-07-28 DIAGNOSIS — E1122 Type 2 diabetes mellitus with diabetic chronic kidney disease: Secondary | ICD-10-CM | POA: Diagnosis not present

## 2017-07-29 ENCOUNTER — Ambulatory Visit (INDEPENDENT_AMBULATORY_CARE_PROVIDER_SITE_OTHER): Payer: Medicare Other | Admitting: Pharmacist Clinician (PhC)/ Clinical Pharmacy Specialist

## 2017-07-29 DIAGNOSIS — N186 End stage renal disease: Secondary | ICD-10-CM | POA: Diagnosis not present

## 2017-07-29 DIAGNOSIS — Z7901 Long term (current) use of anticoagulants: Secondary | ICD-10-CM | POA: Diagnosis not present

## 2017-07-29 DIAGNOSIS — N2581 Secondary hyperparathyroidism of renal origin: Secondary | ICD-10-CM | POA: Diagnosis not present

## 2017-07-29 DIAGNOSIS — D631 Anemia in chronic kidney disease: Secondary | ICD-10-CM | POA: Diagnosis not present

## 2017-07-29 DIAGNOSIS — I4891 Unspecified atrial fibrillation: Secondary | ICD-10-CM

## 2017-07-29 LAB — POCT INR: INR: 2.4

## 2017-08-01 ENCOUNTER — Telehealth: Payer: Self-pay | Admitting: *Deleted

## 2017-08-01 ENCOUNTER — Other Ambulatory Visit: Payer: Self-pay | Admitting: *Deleted

## 2017-08-01 DIAGNOSIS — N186 End stage renal disease: Secondary | ICD-10-CM | POA: Diagnosis not present

## 2017-08-01 DIAGNOSIS — D631 Anemia in chronic kidney disease: Secondary | ICD-10-CM | POA: Diagnosis not present

## 2017-08-01 DIAGNOSIS — Z952 Presence of prosthetic heart valve: Secondary | ICD-10-CM

## 2017-08-01 DIAGNOSIS — I350 Nonrheumatic aortic (valve) stenosis: Secondary | ICD-10-CM

## 2017-08-01 DIAGNOSIS — N2581 Secondary hyperparathyroidism of renal origin: Secondary | ICD-10-CM | POA: Diagnosis not present

## 2017-08-01 NOTE — Telephone Encounter (Signed)
I spoke with pt's daughter and scheduled one year post TAVR appointment and echo for 08/25/17

## 2017-08-02 ENCOUNTER — Ambulatory Visit (INDEPENDENT_AMBULATORY_CARE_PROVIDER_SITE_OTHER): Payer: Medicare Other | Admitting: *Deleted

## 2017-08-02 DIAGNOSIS — H353232 Exudative age-related macular degeneration, bilateral, with inactive choroidal neovascularization: Secondary | ICD-10-CM | POA: Diagnosis not present

## 2017-08-02 DIAGNOSIS — E1122 Type 2 diabetes mellitus with diabetic chronic kidney disease: Secondary | ICD-10-CM | POA: Diagnosis not present

## 2017-08-02 DIAGNOSIS — I951 Orthostatic hypotension: Secondary | ICD-10-CM | POA: Diagnosis not present

## 2017-08-02 DIAGNOSIS — I509 Heart failure, unspecified: Secondary | ICD-10-CM | POA: Diagnosis not present

## 2017-08-02 DIAGNOSIS — I495 Sick sinus syndrome: Secondary | ICD-10-CM

## 2017-08-02 DIAGNOSIS — E119 Type 2 diabetes mellitus without complications: Secondary | ICD-10-CM | POA: Diagnosis not present

## 2017-08-02 DIAGNOSIS — H40013 Open angle with borderline findings, low risk, bilateral: Secondary | ICD-10-CM | POA: Diagnosis not present

## 2017-08-02 DIAGNOSIS — M6281 Muscle weakness (generalized): Secondary | ICD-10-CM | POA: Diagnosis not present

## 2017-08-02 DIAGNOSIS — N186 End stage renal disease: Secondary | ICD-10-CM | POA: Diagnosis not present

## 2017-08-02 DIAGNOSIS — I132 Hypertensive heart and chronic kidney disease with heart failure and with stage 5 chronic kidney disease, or end stage renal disease: Secondary | ICD-10-CM | POA: Diagnosis not present

## 2017-08-02 NOTE — Progress Notes (Signed)
Remote pacemaker transmission.   

## 2017-08-03 ENCOUNTER — Encounter: Payer: Self-pay | Admitting: Cardiology

## 2017-08-03 DIAGNOSIS — N186 End stage renal disease: Secondary | ICD-10-CM | POA: Diagnosis not present

## 2017-08-03 DIAGNOSIS — D631 Anemia in chronic kidney disease: Secondary | ICD-10-CM | POA: Diagnosis not present

## 2017-08-03 DIAGNOSIS — I482 Chronic atrial fibrillation: Secondary | ICD-10-CM | POA: Diagnosis not present

## 2017-08-03 DIAGNOSIS — N2581 Secondary hyperparathyroidism of renal origin: Secondary | ICD-10-CM | POA: Diagnosis not present

## 2017-08-04 ENCOUNTER — Encounter: Payer: Self-pay | Admitting: Cardiovascular Disease

## 2017-08-04 ENCOUNTER — Ambulatory Visit (INDEPENDENT_AMBULATORY_CARE_PROVIDER_SITE_OTHER): Payer: Medicare Other | Admitting: Pharmacist

## 2017-08-04 ENCOUNTER — Ambulatory Visit (INDEPENDENT_AMBULATORY_CARE_PROVIDER_SITE_OTHER): Payer: Medicare Other | Admitting: Cardiovascular Disease

## 2017-08-04 VITALS — BP 142/64 | HR 63 | Ht 70.5 in | Wt 197.0 lb

## 2017-08-04 DIAGNOSIS — Z7901 Long term (current) use of anticoagulants: Secondary | ICD-10-CM

## 2017-08-04 DIAGNOSIS — I4891 Unspecified atrial fibrillation: Secondary | ICD-10-CM | POA: Diagnosis not present

## 2017-08-04 DIAGNOSIS — Z5181 Encounter for therapeutic drug level monitoring: Secondary | ICD-10-CM

## 2017-08-04 DIAGNOSIS — I495 Sick sinus syndrome: Secondary | ICD-10-CM

## 2017-08-04 DIAGNOSIS — E1122 Type 2 diabetes mellitus with diabetic chronic kidney disease: Secondary | ICD-10-CM | POA: Diagnosis not present

## 2017-08-04 DIAGNOSIS — N186 End stage renal disease: Secondary | ICD-10-CM | POA: Diagnosis not present

## 2017-08-04 DIAGNOSIS — I4821 Permanent atrial fibrillation: Secondary | ICD-10-CM

## 2017-08-04 DIAGNOSIS — I509 Heart failure, unspecified: Secondary | ICD-10-CM | POA: Diagnosis not present

## 2017-08-04 DIAGNOSIS — I482 Chronic atrial fibrillation: Secondary | ICD-10-CM | POA: Diagnosis not present

## 2017-08-04 DIAGNOSIS — E7800 Pure hypercholesterolemia, unspecified: Secondary | ICD-10-CM

## 2017-08-04 DIAGNOSIS — I132 Hypertensive heart and chronic kidney disease with heart failure and with stage 5 chronic kidney disease, or end stage renal disease: Secondary | ICD-10-CM | POA: Diagnosis not present

## 2017-08-04 DIAGNOSIS — E785 Hyperlipidemia, unspecified: Secondary | ICD-10-CM | POA: Diagnosis not present

## 2017-08-04 DIAGNOSIS — Z952 Presence of prosthetic heart valve: Secondary | ICD-10-CM

## 2017-08-04 DIAGNOSIS — M6281 Muscle weakness (generalized): Secondary | ICD-10-CM | POA: Diagnosis not present

## 2017-08-04 DIAGNOSIS — I951 Orthostatic hypotension: Secondary | ICD-10-CM | POA: Diagnosis not present

## 2017-08-04 LAB — POCT INR: INR: 1.6

## 2017-08-04 NOTE — Patient Instructions (Signed)
Medication Instructions:  IF YOU DID NOT START PRAVASTATIN YOU NEED TO START TAKING DAILY   Labwork: FASTING LP/CMET SOON IF YOU STARTED PRAVASTATIN. IF YOU DID NOT START GET  LABS WHEN YOU FOLLOW UP OR FEW DAYS BEFORE   Testing/Procedures: NONE  Follow-Up: Your physician recommends that you schedule a follow-up appointment in: 3 MONTH OV   If you need a refill on your cardiac medications before your next appointment, please call your pharmacy.

## 2017-08-04 NOTE — Progress Notes (Signed)
Cardiology Office Note   Date:  08/04/2017   ID:  Alexander [name], DOB Oct 05, 1940, MRN [id number]  PCP:  Deland Pretty, MD  Cardiologist:   Skeet Latch, MD  Electrophysiologist: Allegra Lai, MD  No chief complaint on file.   History of Present Illness: Alexander [name] is a 77 y.o. male with severe aortic stenosis s/p TAVR, ESRD on HD, hypotension on midodrine, diabetes mellitus, hyperlipidemia, carotid artery disease with complete occlusion of the L carotid, prior TIA and paroxysmal atrial fibrillation  who presents for follow up.  He was initially evaluated on 12/04/14 with hypotension requiring midodrine and syncope.  He had an echo that revealed LVEF 60-65% with grade 2 diastolic dysfunction, moderate AS and mild MR. He wore a 48 hour Holter 11/2014 that revealed episodes of dizziness associated with heart rates in the 50s. He had a PPM implanted 01/2015.  He had a repeat monitor 12/2014 that showed atrial fibrillation with occasional junctional escape.  He subsequently had a Lexiscan Cardiolite 01/2015 that was negative for ischemia but showed an area of distal inferior infarct vs artifact.  He saw Dr. Curt Bears on 10/06/15 where he was found to be in persistent atrial fibrillation.  He went into sinus rhythm briefly after DCCV with no appreciable change in symptoms.  [name] underwent placement of a 29 mm Sapien 3 TAVR on 08/2016.  Echo on 10/07/16 revealed LVEF 55-60% with mild LVH. The prostatic valve was functioning properly early without any significant stenosis or regurgitation. 10/2016 he reported significant fatigue and shortness of breath. He had a chest x-ray 11/11/16 that revealed mild cardiomegaly and tiny pleural effusions consistent with mild CHF. BNP was 18,000.  His dry weight was lowered with HD with improvement in symptoms.  [name] was admitted to the hospital 11/2016 with sepsis 2/2 Strep bacteremia.  The source was unclear, but presumed to be 2/2 HD access.  He had a  CT of the spine that showed no evidence of discitis or abscess.  He also had a TEE 12/01/16 that revealed no vegetations on his PPM lead and normal TAVR valve function.  There was moderate TR and moderate MR.  He has continued to struggle with low BP during HD.  Diltiazem was discontinued and he was started on digoxin.  He had an episode of syncope that was attributed to orthostasis.  He had carotid Dopplers 04/2017 that were unchanged from prior and (100% L ICA stenosis 4-59% R).  No arrhythmias were noted on his ICD.   At his last appointment metoprolol was reduced and he was started on pravastatin.  His wife is unsure whether this prescription was filled.  He followed up with Dr. Curt Bears on 05/2017 and had evidence of atrial fibrillation with rapid ventricular response on his pacemaker.  Metoprolol was discontinued and he was started on diltiazem 120 mg daily.  Since that appointment [name] has been feeling well.  His blood pressure usually runs in the 120s in the mornings.  Very rarely drops below 110 during dialysis.  Therefore, he typically does not have to take any extra midodrine during dialysis.   He has been wearing compression stockings and has no lower extremity edema.  He denies orthopnea or PND.  His breathing has been fine and he is overall feeling well.  His main complaint is arthritis in his hands.     Past Medical History:  Diagnosis Date  . Anemia   . Aortic stenosis 06/15/12   TEE -  EF 95-28%; grade 1 diastolic dysfunction; mild/mod aortic valve stenosis; Mitral valve had calcified annulus, mild pulm htn PA peak pressure 56mmHg  . Barrett's esophagus 05/2003  . Bradycardia 2017   [employer] 2240 Assurity dual-lead pacemaker  . Carpal tunnel syndrome, bilateral 11/03/2015  . Colon polyps   . CVA (cerebral infarction)    2004/affected left side  . Depression   . Diabetes mellitus without complication ([place])   . Diabetic peripheral neuropathy ([place]) 10/02/2015  . Diverticulosis     . Dyspnea    with exertion  . ESRD (end stage renal disease) on dialysis ([place])    "[facility]; [place]; MWF" (05/12/2017)  . GERD (gastroesophageal reflux disease)   . Glaucoma   . History of kidney stones   . Hyperlipidemia   . Hypertension   . Legally blind   . Macular degeneration    both eyes  . Orthostatic hypotension 09/09/2015  . Paroxysmal atrial fibrillation ([facility])   . Peptic ulcer    bleeding, 1969  . Presence of permanent cardiac pacemaker   . S/P epidural steroid injection    last  injection over 10 years ago  . Seasonal allergies   . Tubular adenoma of colon 07/2001    Past Surgical History:  Procedure Laterality Date  . AV FISTULA PLACEMENT  2009  . BACK SURGERY    . CARDIOVERSION N/A 11/13/2015   Procedure: CARDIOVERSION;  Surgeon: Troy Sine, MD;  Location: [facility] ENDOSCOPY;  Service: Cardiovascular;  Laterality: N/A;  . CARDIOVERSION N/A 01/13/2016   Procedure: CARDIOVERSION;  Surgeon: Will Meredith [name], MD;  Location: [place];  Service: Cardiovascular;  Laterality: N/A;  . [place]   right eye  . CYSTOSCOPY  several times   kidney stones  . EP IMPLANTABLE DEVICE N/A 03/11/2015   Procedure: Pacemaker Implant;  Surgeon: Will Meredith [name], MD;  [place];  Laterality: Left  . LAMINECTOMY  1969  . RIGHT/LEFT HEART CATH AND CORONARY ANGIOGRAPHY N/A 08/20/2016   Procedure: Right/Left Heart Cath and Coronary Angiography;  Surgeon: Burnell Blanks, MD;  Location: [place] [facility];  Service: Cardiovascular;  Laterality: N/A;  . TEE WITHOUT CARDIOVERSION N/A 07/22/2016   Procedure: TRANSESOPHAGEAL ECHOCARDIOGRAM (TEE);  Surgeon: Skeet Latch, MD;  Location: [place];  Service: Cardiovascular;  Laterality: N/A;  . TEE WITHOUT CARDIOVERSION N/A 08/31/2016   Procedure: TRANSESOPHAGEAL ECHOCARDIOGRAM (TEE);  Surgeon: Burnell Blanks, MD;  Location: [place];  Service: Open Heart Surgery;  Laterality: N/A;  . TEE  WITHOUT CARDIOVERSION N/A 12/01/2016   Procedure: TRANSESOPHAGEAL ECHOCARDIOGRAM (TEE);  Surgeon: Larey Dresser, MD;  Location: [facility] ENDOSCOPY;  Service: Cardiovascular;  Laterality: N/A;  . TONSILLECTOMY  1964  . TRANSCATHETER AORTIC VALVE REPLACEMENT, TRANSFEMORAL N/A 08/31/2016   Procedure: TRANSCATHETER AORTIC VALVE REPLACEMENT, TRANSFEMORAL;  Surgeon: Burnell Blanks, MD;  Location: [place];  Service: Open Heart Surgery;  Laterality: N/A;     Current Outpatient Medications  Medication Sig Dispense Refill  . acetaminophen (TYLENOL) 325 MG tablet Take 2 tablets (650 mg total) by mouth every 6 (six) hours as needed for mild pain. (Patient taking differently: Take 650 mg by mouth every 6 (six) hours. )    . b complex-vitamin c-folic acid (NEPHRO-VITE) 0.8 MG TABS Take 1 tablet by mouth daily.     . bromocriptine (PARLODEL) 5 MG capsule Take 5 mg by mouth at bedtime.     . cetirizine (ZYRTEC) 10 MG tablet Take 10 mg by mouth  at bedtime.     . cinacalcet (SENSIPAR) 30 MG tablet Take 30 mg by mouth 2 (two) times daily with a meal.     . clindamycin (CLEOCIN) 150 MG capsule Take 13 capsules (1,950 mg total) by mouth See admin instructions. Take 2,000 mg by mouth one hour prior to dental procedure 30 capsule 2  . digoxin (LANOXIN) 0.125 MG tablet Take 0.5 tablets (0.0625 mg total) by mouth daily. 15 tablet 3  . diltiazem (CARDIZEM CD) 120 MG 24 hr capsule Take 1 capsule (120 mg total) by mouth daily. 30 capsule 11  . dorzolamide-timolol (COSOPT) 22.3-6.8 MG/ML ophthalmic solution Place 1 drop into both eyes 2 (two) times daily.     . methocarbamol (ROBAXIN) 500 MG tablet Take 1 tablet (500 mg total) by mouth every 6 (six) hours as needed for muscle spasms. 10 tablet 0  . midodrine (PROAMATINE) 10 MG tablet Take 10 mg by mouth See admin instructions. 10 mg by mouth prior to dialysis and an extra 5 mg as needed for low b/p prior to dialysis (may also take 15 mg as needed for low b/p during dialysis)     . midodrine (PROAMATINE) 5 MG tablet Take 5-15 mg by mouth daily as needed (for a sudden drop in blood pressure).     . midodrine (PROAMATINE) 5 MG tablet TAKE 3 TABLETS BY MOUTH THREE TIMES DAILY AS NEEDED. 270 tablet 3  . Nutritional Supplements (NEPRO) LIQD Take 237 mLs by mouth every Monday, Wednesday, and Friday with hemodialysis.     [name] omeprazole (PRILOSEC) 20 MG capsule Take 20 mg by mouth daily.    . polyethylene glycol (MIRALAX / GLYCOLAX) packet Take 17 g by mouth daily. 14 each 0  . pravastatin (PRAVACHOL) 10 MG tablet Take 1 tablet (10 mg total) by mouth daily. 90 tablet 1  . predniSONE (DELTASONE) 5 MG tablet Take 5 mg by mouth at bedtime.   1  . senna-docusate (SENOKOT-S) 8.6-50 MG tablet Take 1 tablet by mouth at bedtime. (Patient taking differently: Take 1 tablet by mouth at bedtime as needed for mild constipation. ) 14 tablet 0  . sertraline (ZOLOFT) 50 MG tablet Take 50 mg by mouth daily.  5  . sevelamer carbonate (RENVELA) 800 MG tablet Take 800 mg by mouth 3 (three) times daily with meals.    . Tiotropium Bromide-Olodaterol (STIOLTO RESPIMAT) 2.5-2.5 MCG/ACT AERS Inhale 2 puffs into the lungs daily. 1 Inhaler 5  . warfarin (COUMADIN) 5 MG tablet Take 1-1.5 tablets by mouth daily as directed by coumadin clinic (Patient taking differently: Take 2.5-5 mg by mouth See admin instructions. 5 mg by mouth at night on Sun/Tues/Thurs/Sat and 2.5 mg on Mon/Wed/Fri) 120 tablet 1   No current facility-administered medications for this visit.     Allergies:   Penicillins; Atorvastatin; Codeine; and Tramadol    Social History:  The patient  reports that he quit smoking about 19 years ago. His smoking use included cigarettes. He has a 90.00 pack-year smoking history. He has never used smokeless tobacco. He reports that he does not drink alcohol or use drugs.    Family History:  The patient's family history includes Cancer in his brother; Heart attack in his father; Heart disease in his  sister; Hypertension in his father; Stomach cancer in his mother; Stroke in his sister.    ROS:  Please see the history of present illness.   Otherwise, review of systems are positive for leg spasm.   All other systems  are reviewed and negative.    PHYSICAL EXAM: VS:  BP (!) 142/64   Pulse 63   Ht 5' 10.5" (1.791 m)   Wt 197 lb (89.4 kg)   BMI 27.87 kg/m  , BMI Body mass index is 27.87 kg/m. GENERAL:  Chronically ill-appearing.  No acute distress.  HEENT: Pupils equal round and reactive, fundi not visualized, oral mucosa unremarkable NECK:  No jugular venous distention, waveform within normal limits, carotid upstroke brisk and symmetric, no bruits LUNGS:  Clear to auscultation bilaterally HEART:  RRR.  PMI not displaced or sustained,S1 and S2 within normal limits, no S3, no S4, no clicks, no rubs, II/VI systolic murmur at the LUSB ABD:  Flat, positive bowel sounds normal in frequency in pitch, no bruits, no rebound, no guarding, no midline pulsatile mass, no hepatomegaly, no splenomegaly EXT:  2 plus pulses throughout, no edema, no cyanosis no clubbing.  L LE fistula +bruit SKIN:  No rashes no nodules NEURO:  Cranial nerves II through XII grossly intact, motor grossly intact throughout PSYCH:  Cognitively intact, oriented to person place and time   EKG:  EKG is not ordered today. 09/09/15: Atrial fibrillation rate 108 bpm.  Prior inferior infarct. 01/16/15: Sinus bradycardia.  First degree heart block.  LAFB.  U waves. The ekg from 11/19/14 demonstrates sinus bradycardia at 49 bpm. First degree heart block.  08/06/16: Atrial fibrillation.  Rate 82 bpm.  Occcasional VP. 11/11/16: Atrial fibrillation. Rate 97 bpm. Left bundle branch block.  03/15/17: Atrial fibrillation  Rate 105 bpm.  LBBB.  PVC  Lexiscan Cardiolite 01/28/15:  Nuclear stress EF: 67%.  The left ventricular ejection fraction is hyperdynamic (>65%).  There was no ST segment deviation noted during stress.  This is a  low risk study.  Low risk stress nuclear study with a small, severe, predominantly fixed distal inferior defect consistent with small prior infarct vs thinning; no significant ischemia; EF 67 with normal wall motion.  Echo 10/07/16: Study Conclusions  - Left ventricle: The cavity size was normal. Wall thickness was   increased in a pattern of mild LVH. Systolic function was normal.   The estimated ejection fraction was in the range of 55% to 60%.   Wall motion was normal; there were no regional wall motion   abnormalities. Doppler parameters are consistent with high   ventricular filling pressure. - Aortic valve: A bioprosthesis was present. - Mitral valve: Calcified annulus. There was mild regurgitation.   Valve area by pressure half-time: 2.08 cm^2. - Left atrium: The atrium was mildly dilated. - Right atrium: The atrium was moderately dilated. - Pulmonary arteries: Systolic pressure was mildly increased. PA   peak pressure: 36 mm Hg (S).  Impressions:  - Normal LV systolic function; mild LVH; elevated LV filling   pressure; s/p AVR with normal mean gradient (9 mmHg) and   calculated AVA of 1.4 cm2; no AI; mild MR; biatrial enlargement;   mild TR with mildly elevated pulmonary pressure.  Recent Labs: 11/11/2016: NT-Pro BNP 18,028 01/29/2017: ALT 19 05/13/2017: BUN 33; Creatinine, Ser 7.58; Hemoglobin 11.5; Magnesium 1.8; Platelets 150; Potassium 3.9; Sodium 141; TSH 1.526    Lipid Panel    Component Value Date/Time   CHOL 203 (H) 05/14/2017 0514   TRIG 273 (H) 05/14/2017 0514   HDL 27 (L) 05/14/2017 0514   CHOLHDL 7.5 05/14/2017 0514   VLDL 55 (H) 05/14/2017 0514   LDLCALC 121 (H) 05/14/2017 0514      Wt Readings from Last 3  Encounters:  08/04/17 197 lb (89.4 kg)  07/26/17 197 lb (89.4 kg)  06/21/17 196 lb (88.9 kg)     Other studies Reviewed: Additional studies/ records that were reviewed today include: [facility] records Review of the above records demonstrates:   Please see elsewhere in the note.     ASSESSMENT AND PLAN:  # Severe aortic stenosis s/p TAVR:  # Shortness of breath: TAVR is functioning properly.  Symptoms have improved and TAVR looked good on echo 11/2016.  No changes.   # Hypotension/syncope:  Doing much better.  No changes at this time.  # Bradycardia/longstanding persistent atrial fibrillation:  Chronic atrial fibrillation.  Now on digoxin and diltiazem.  Continue warfarin.    # Hyperlipidemia: LDL 76 10/2015.   He had myalgias on atorvastatin.  He is unsure whether he started the pravastatin prescription.  He will call us and let us know.  If he is taking it then he will come for lipids and CMP now.  Otherwise he will start taking it and follow-up in 3 months.  Current medicines are reviewed at length with the patient today.  The patient does not have concerns regarding medicines.  The following changes have been made: none  Labs/ tests ordered today include:   Orders Placed This Encounter  Procedures  . Lipid panel  . Comprehensive metabolic panel     Disposition:   FU with Dr. Jonelle Sidle C. Pepin in 3 months.   Signed, Skeet Latch, MD  08/04/2017 9:55 AM    [place]

## 2017-08-05 DIAGNOSIS — D631 Anemia in chronic kidney disease: Secondary | ICD-10-CM | POA: Diagnosis not present

## 2017-08-05 DIAGNOSIS — N186 End stage renal disease: Secondary | ICD-10-CM | POA: Diagnosis not present

## 2017-08-05 DIAGNOSIS — N2581 Secondary hyperparathyroidism of renal origin: Secondary | ICD-10-CM | POA: Diagnosis not present

## 2017-08-08 ENCOUNTER — Telehealth: Payer: Self-pay | Admitting: Cardiovascular Disease

## 2017-08-08 DIAGNOSIS — N186 End stage renal disease: Secondary | ICD-10-CM | POA: Diagnosis not present

## 2017-08-08 DIAGNOSIS — N2581 Secondary hyperparathyroidism of renal origin: Secondary | ICD-10-CM | POA: Diagnosis not present

## 2017-08-08 DIAGNOSIS — M961 Postlaminectomy syndrome, not elsewhere classified: Secondary | ICD-10-CM | POA: Diagnosis not present

## 2017-08-08 DIAGNOSIS — D631 Anemia in chronic kidney disease: Secondary | ICD-10-CM | POA: Diagnosis not present

## 2017-08-08 MED ORDER — PRAVASTATIN SODIUM 10 MG PO TABS: 10.0000 mg | ORAL_TABLET | Freq: Every day | ORAL | 0 refills | Status: AC

## 2017-08-08 NOTE — Telephone Encounter (Signed)
New message  Pt daughter verbalized that she is returning call for RN  She was told to call and give the location that she wants her fathers medication   sent too [name]  Address: [address], [place], [place] [address]

## 2017-08-08 NOTE — Telephone Encounter (Signed)
Rx(s) sent to pharmacy electronically.  

## 2017-08-09 DIAGNOSIS — I132 Hypertensive heart and chronic kidney disease with heart failure and with stage 5 chronic kidney disease, or end stage renal disease: Secondary | ICD-10-CM | POA: Diagnosis not present

## 2017-08-09 DIAGNOSIS — M6281 Muscle weakness (generalized): Secondary | ICD-10-CM | POA: Diagnosis not present

## 2017-08-09 DIAGNOSIS — I951 Orthostatic hypotension: Secondary | ICD-10-CM | POA: Diagnosis not present

## 2017-08-09 DIAGNOSIS — N186 End stage renal disease: Secondary | ICD-10-CM | POA: Diagnosis not present

## 2017-08-09 DIAGNOSIS — I509 Heart failure, unspecified: Secondary | ICD-10-CM | POA: Diagnosis not present

## 2017-08-09 DIAGNOSIS — E1122 Type 2 diabetes mellitus with diabetic chronic kidney disease: Secondary | ICD-10-CM | POA: Diagnosis not present

## 2017-08-10 ENCOUNTER — Ambulatory Visit (INDEPENDENT_AMBULATORY_CARE_PROVIDER_SITE_OTHER): Payer: Medicare Other | Admitting: Pharmacist Clinician (PhC)/ Clinical Pharmacy Specialist

## 2017-08-10 DIAGNOSIS — N2581 Secondary hyperparathyroidism of renal origin: Secondary | ICD-10-CM | POA: Diagnosis not present

## 2017-08-10 DIAGNOSIS — I4891 Unspecified atrial fibrillation: Secondary | ICD-10-CM

## 2017-08-10 DIAGNOSIS — Z7901 Long term (current) use of anticoagulants: Secondary | ICD-10-CM | POA: Diagnosis not present

## 2017-08-10 DIAGNOSIS — D631 Anemia in chronic kidney disease: Secondary | ICD-10-CM | POA: Diagnosis not present

## 2017-08-10 DIAGNOSIS — N186 End stage renal disease: Secondary | ICD-10-CM | POA: Diagnosis not present

## 2017-08-10 LAB — POCT INR: INR: 1.8

## 2017-08-10 NOTE — Patient Instructions (Signed)
Description   Take 1 tablet today Wednesday May 15, then continue with 1/2 tablet daily except 1 tablet each Tuesday, Thursday and Saturday.  Repeat INR in 2 week.

## 2017-08-12 DIAGNOSIS — N186 End stage renal disease: Secondary | ICD-10-CM | POA: Diagnosis not present

## 2017-08-12 DIAGNOSIS — N2581 Secondary hyperparathyroidism of renal origin: Secondary | ICD-10-CM | POA: Diagnosis not present

## 2017-08-12 DIAGNOSIS — D631 Anemia in chronic kidney disease: Secondary | ICD-10-CM | POA: Diagnosis not present

## 2017-08-15 DIAGNOSIS — N2581 Secondary hyperparathyroidism of renal origin: Secondary | ICD-10-CM | POA: Diagnosis not present

## 2017-08-15 DIAGNOSIS — N186 End stage renal disease: Secondary | ICD-10-CM | POA: Diagnosis not present

## 2017-08-15 DIAGNOSIS — D631 Anemia in chronic kidney disease: Secondary | ICD-10-CM | POA: Diagnosis not present

## 2017-08-17 DIAGNOSIS — I132 Hypertensive heart and chronic kidney disease with heart failure and with stage 5 chronic kidney disease, or end stage renal disease: Secondary | ICD-10-CM | POA: Diagnosis not present

## 2017-08-17 DIAGNOSIS — E1122 Type 2 diabetes mellitus with diabetic chronic kidney disease: Secondary | ICD-10-CM | POA: Diagnosis not present

## 2017-08-17 DIAGNOSIS — M6281 Muscle weakness (generalized): Secondary | ICD-10-CM | POA: Diagnosis not present

## 2017-08-17 DIAGNOSIS — N2581 Secondary hyperparathyroidism of renal origin: Secondary | ICD-10-CM | POA: Diagnosis not present

## 2017-08-17 DIAGNOSIS — I509 Heart failure, unspecified: Secondary | ICD-10-CM | POA: Diagnosis not present

## 2017-08-17 DIAGNOSIS — D631 Anemia in chronic kidney disease: Secondary | ICD-10-CM | POA: Diagnosis not present

## 2017-08-17 DIAGNOSIS — I951 Orthostatic hypotension: Secondary | ICD-10-CM | POA: Diagnosis not present

## 2017-08-17 DIAGNOSIS — N186 End stage renal disease: Secondary | ICD-10-CM | POA: Diagnosis not present

## 2017-08-19 DIAGNOSIS — D631 Anemia in chronic kidney disease: Secondary | ICD-10-CM | POA: Diagnosis not present

## 2017-08-19 DIAGNOSIS — N186 End stage renal disease: Secondary | ICD-10-CM | POA: Diagnosis not present

## 2017-08-19 DIAGNOSIS — N2581 Secondary hyperparathyroidism of renal origin: Secondary | ICD-10-CM | POA: Diagnosis not present

## 2017-08-19 LAB — CUP PACEART REMOTE DEVICE CHECK
Battery Voltage: 3.01 V
Implantable Lead Location: 753859
Lead Channel Impedance Value: 430 Ohm
Lead Channel Sensing Intrinsic Amplitude: 9.3 mV
Lead Channel Setting Pacing Pulse Width: 0.5 ms
MDC IDC LEAD IMPLANT DT: 20161213
MDC IDC LEAD IMPLANT DT: 20161213
MDC IDC LEAD LOCATION: 753860
MDC IDC MSMT BATTERY REMAINING LONGEVITY: 122 mo
MDC IDC MSMT BATTERY REMAINING PERCENTAGE: 95.5 %
MDC IDC MSMT LEADCHNL RV PACING THRESHOLD AMPLITUDE: 1 V
MDC IDC MSMT LEADCHNL RV PACING THRESHOLD PULSEWIDTH: 0.5 ms
MDC IDC PG IMPLANT DT: 20161213
MDC IDC PG SERIAL: 7839215
MDC IDC SESS DTM: 20190507060014
MDC IDC SET LEADCHNL RV PACING AMPLITUDE: 2.5 V
MDC IDC SET LEADCHNL RV SENSING SENSITIVITY: 2 mV
MDC IDC STAT BRADY RV PERCENT PACED: 63 %

## 2017-08-22 DIAGNOSIS — D631 Anemia in chronic kidney disease: Secondary | ICD-10-CM | POA: Diagnosis not present

## 2017-08-22 DIAGNOSIS — N2581 Secondary hyperparathyroidism of renal origin: Secondary | ICD-10-CM | POA: Diagnosis not present

## 2017-08-22 DIAGNOSIS — N186 End stage renal disease: Secondary | ICD-10-CM | POA: Diagnosis not present

## 2017-08-23 ENCOUNTER — Ambulatory Visit (INDEPENDENT_AMBULATORY_CARE_PROVIDER_SITE_OTHER): Payer: Medicare Other | Admitting: Pharmacist Clinician (PhC)/ Clinical Pharmacy Specialist

## 2017-08-23 DIAGNOSIS — E1122 Type 2 diabetes mellitus with diabetic chronic kidney disease: Secondary | ICD-10-CM | POA: Diagnosis not present

## 2017-08-23 DIAGNOSIS — I509 Heart failure, unspecified: Secondary | ICD-10-CM | POA: Diagnosis not present

## 2017-08-23 DIAGNOSIS — E7800 Pure hypercholesterolemia, unspecified: Secondary | ICD-10-CM | POA: Diagnosis not present

## 2017-08-23 DIAGNOSIS — I4891 Unspecified atrial fibrillation: Secondary | ICD-10-CM

## 2017-08-23 DIAGNOSIS — I132 Hypertensive heart and chronic kidney disease with heart failure and with stage 5 chronic kidney disease, or end stage renal disease: Secondary | ICD-10-CM | POA: Diagnosis not present

## 2017-08-23 DIAGNOSIS — I951 Orthostatic hypotension: Secondary | ICD-10-CM | POA: Diagnosis not present

## 2017-08-23 DIAGNOSIS — N186 End stage renal disease: Secondary | ICD-10-CM | POA: Diagnosis not present

## 2017-08-23 DIAGNOSIS — Z5181 Encounter for therapeutic drug level monitoring: Secondary | ICD-10-CM | POA: Diagnosis not present

## 2017-08-23 DIAGNOSIS — M6281 Muscle weakness (generalized): Secondary | ICD-10-CM | POA: Diagnosis not present

## 2017-08-23 DIAGNOSIS — Z7901 Long term (current) use of anticoagulants: Secondary | ICD-10-CM

## 2017-08-23 LAB — COMPREHENSIVE METABOLIC PANEL
A/G RATIO: 1.8 (ref 1.2–2.2)
ALBUMIN: 3.8 g/dL (ref 3.5–4.8)
ALK PHOS: 85 IU/L (ref 39–117)
ALT: 37 IU/L (ref 0–44)
AST: 22 IU/L (ref 0–40)
BILIRUBIN TOTAL: 0.6 mg/dL (ref 0.0–1.2)
BUN / CREAT RATIO: 5 — AB (ref 10–24)
BUN: 32 mg/dL — ABNORMAL HIGH (ref 8–27)
CHLORIDE: 96 mmol/L (ref 96–106)
CO2: 31 mmol/L — ABNORMAL HIGH (ref 20–29)
Calcium: 8.1 mg/dL — ABNORMAL LOW (ref 8.6–10.2)
Creatinine, Ser: 6.56 mg/dL — ABNORMAL HIGH (ref 0.76–1.27)
GFR calc non Af Amer: 7 mL/min/{1.73_m2} — ABNORMAL LOW (ref >59)
GFR, EST AFRICAN AMERICAN: 9 mL/min/{1.73_m2} — AB (ref >59)
Globulin, Total: 2.1 g/dL (ref 1.5–4.5)
Glucose: 110 mg/dL — ABNORMAL HIGH (ref 65–99)
POTASSIUM: 3.8 mmol/L (ref 3.5–5.2)
SODIUM: 144 mmol/L (ref 134–144)
TOTAL PROTEIN: 5.9 g/dL — AB (ref 6.0–8.5)

## 2017-08-23 LAB — LIPID PANEL
Chol/HDL Ratio: 5.7 ratio — ABNORMAL HIGH (ref 0.0–5.0)
Cholesterol, Total: 147 mg/dL (ref 100–199)
HDL: 26 mg/dL — AB (ref >39)
LDL Calculated: 74 mg/dL (ref 0–99)
TRIGLYCERIDES: 236 mg/dL — AB (ref 0–149)
VLDL CHOLESTEROL CAL: 47 mg/dL — AB (ref 5–40)

## 2017-08-23 LAB — POCT INR: INR: 1.6 — AB (ref 2.0–3.0)

## 2017-08-23 NOTE — Patient Instructions (Signed)
Description   Take 1.5 tablets today Tuesday May 28, then increase dose to 1 tablet daily except 1/2 tablet each Monday, Wednesday and Friday.  Repeat INR in 10 days

## 2017-08-24 DIAGNOSIS — M79604 Pain in right leg: Secondary | ICD-10-CM | POA: Diagnosis not present

## 2017-08-24 DIAGNOSIS — G894 Chronic pain syndrome: Secondary | ICD-10-CM | POA: Diagnosis not present

## 2017-08-24 DIAGNOSIS — N186 End stage renal disease: Secondary | ICD-10-CM | POA: Diagnosis not present

## 2017-08-24 DIAGNOSIS — M47816 Spondylosis without myelopathy or radiculopathy, lumbar region: Secondary | ICD-10-CM | POA: Diagnosis not present

## 2017-08-24 DIAGNOSIS — D631 Anemia in chronic kidney disease: Secondary | ICD-10-CM | POA: Diagnosis not present

## 2017-08-24 DIAGNOSIS — N2581 Secondary hyperparathyroidism of renal origin: Secondary | ICD-10-CM | POA: Diagnosis not present

## 2017-08-24 DIAGNOSIS — G629 Polyneuropathy, unspecified: Secondary | ICD-10-CM | POA: Diagnosis not present

## 2017-08-25 ENCOUNTER — Ambulatory Visit (HOSPITAL_COMMUNITY): Payer: Medicare Other | Attending: Cardiovascular Disease

## 2017-08-25 ENCOUNTER — Other Ambulatory Visit: Payer: Self-pay

## 2017-08-25 ENCOUNTER — Ambulatory Visit (INDEPENDENT_AMBULATORY_CARE_PROVIDER_SITE_OTHER): Payer: Medicare Other | Admitting: Physician Assistant

## 2017-08-25 VITALS — BP 158/62 | HR 66 | Ht 70.5 in | Wt 198.0 lb

## 2017-08-25 DIAGNOSIS — I10 Essential (primary) hypertension: Secondary | ICD-10-CM | POA: Diagnosis not present

## 2017-08-25 DIAGNOSIS — E119 Type 2 diabetes mellitus without complications: Secondary | ICD-10-CM | POA: Diagnosis not present

## 2017-08-25 DIAGNOSIS — Z952 Presence of prosthetic heart valve: Secondary | ICD-10-CM | POA: Insufficient documentation

## 2017-08-25 DIAGNOSIS — E785 Hyperlipidemia, unspecified: Secondary | ICD-10-CM | POA: Diagnosis not present

## 2017-08-25 DIAGNOSIS — Z09 Encounter for follow-up examination after completed treatment for conditions other than malignant neoplasm: Secondary | ICD-10-CM | POA: Diagnosis present

## 2017-08-25 DIAGNOSIS — I350 Nonrheumatic aortic (valve) stenosis: Secondary | ICD-10-CM

## 2017-08-25 DIAGNOSIS — I081 Rheumatic disorders of both mitral and tricuspid valves: Secondary | ICD-10-CM | POA: Diagnosis not present

## 2017-08-25 DIAGNOSIS — I5032 Chronic diastolic (congestive) heart failure: Secondary | ICD-10-CM

## 2017-08-25 DIAGNOSIS — I4891 Unspecified atrial fibrillation: Secondary | ICD-10-CM | POA: Diagnosis not present

## 2017-08-25 DIAGNOSIS — Z8673 Personal history of transient ischemic attack (TIA), and cerebral infarction without residual deficits: Secondary | ICD-10-CM | POA: Diagnosis not present

## 2017-08-25 DIAGNOSIS — I4821 Permanent atrial fibrillation: Secondary | ICD-10-CM

## 2017-08-25 DIAGNOSIS — I482 Chronic atrial fibrillation: Secondary | ICD-10-CM | POA: Diagnosis not present

## 2017-08-25 MED ORDER — CLINDAMYCIN HCL 300 MG PO CAPS: ORAL_CAPSULE | ORAL | 3 refills | Status: DC

## 2017-08-25 NOTE — Progress Notes (Signed)
[facility] [place]                                       Cardiology Office Note    Date:  08/25/2017   ID:  Alexander [name], DOB [birth year], MRN [id number]  PCP:  Deland Pretty, MD  Cardiologist: [name] / Dr. Curt Bears (EP) /  Dr. Angelena Form & Dr. Cyndia Bent (TAVR)  CC: 1 year follow up s/p TAVR   History of Present Illness:  Alexander [name] is a 77 y.o. male with a history of bradycardia s/p pacemaker placement, DM, HTN, HLD, chronc diastolic CHF, ESRD on HD, chronic atrial fibrillation on coumadin, prior CVA, carotid artery disease and severe aortic stenosis s/p TAVR (08/2016) who presents to clinic for 1 year follow up.   He underwent placement of a 29 mm Sapien 3 bioprosthetic AVR from the right transfemoral approach on 08/31/16. He was discharged on ASA and coumadin after an uncomplicated post-op course.  He has had several readmissions for CHF and sepsis 2/2 Strep bacteremia. TEE 12/01/16 that revealed no vegetations on his PPM lead and normal TAVR valve function.  There was moderate TR and moderate MR. He is closely followed by Dr. Lucretia Field.   Today he presents to clinic for follow up. He overall feels better than before his surgery but still gets significant exertional SOB and fatigue. Just getting up and walking around makes him short of breath and tired. No CP. No LE edema, orthopnea or PND. No dizziness or syncope. No blood in stool or urine. No palpitations.    Past Medical History:  Diagnosis Date  . Anemia   . Aortic stenosis 06/15/12   TEE - EF 64-33%; grade 1 diastolic dysfunction; mild/mod aortic valve stenosis; Mitral valve had calcified annulus, mild pulm htn PA peak pressure 36mmHg  . Barrett's esophagus 05/2003  . Bradycardia 2017   [employer] 2240 Assurity dual-lead pacemaker  . Carpal tunnel syndrome, bilateral 11/03/2015  . Colon polyps   . CVA (cerebral infarction)    2004/affected left side  . Depression   . Diabetes  mellitus without complication ([place])   . Diabetic peripheral neuropathy ([place]) 10/02/2015  . Diverticulosis   . Dyspnea    with exertion  . ESRD (end stage renal disease) on dialysis ([place])    "[facility]; [place]; MWF" (05/12/2017)  . GERD (gastroesophageal reflux disease)   . Glaucoma   . History of kidney stones   . Hyperlipidemia   . Hypertension   . Legally blind   . Macular degeneration    both eyes  . Orthostatic hypotension 09/09/2015  . Paroxysmal atrial fibrillation (H[facility])   . Peptic ulcer    bleeding, 1969  . Presence of permanent cardiac pacemaker   . S/P epidural steroid injection    last  injection over 10 years ago  . Seasonal allergies   . Tubular adenoma of colon 07/2001    Past Surgical History:  Procedure Laterality Date  . AV FISTULA PLACEMENT  2009  . BACK SURGERY    . CARDIOVERSION N/A 11/13/2015   Procedure: CARDIOVERSION;  Surgeon: Troy Sine, MD;  Location: [facility] ENDOSCOPY;  Service: Cardiovascular;  Laterality: N/A;  . CARDIOVERSION N/A 01/13/2016   Procedure: CARDIOVERSION;  Surgeon: Will Meredith [name], MD;  Location: [place];  Service: Cardiovascular;  Laterality: N/A;  . [place]  right eye  . CYSTOSCOPY  several times   kidney stones  . EP IMPLANTABLE DEVICE N/A 03/11/2015   Procedure: Pacemaker Implant;  Surgeon: Will Meredith [name], MD;  [place];  Laterality: Left  . LAMINECTOMY  1969  . RIGHT/LEFT HEART CATH AND CORONARY ANGIOGRAPHY N/A 08/20/2016   Procedure: Right/Left Heart Cath and Coronary Angiography;  Surgeon: Burnell Blanks, MD;  Location: [place] [facility];  Service: Cardiovascular;  Laterality: N/A;  . TEE WITHOUT CARDIOVERSION N/A 07/22/2016   Procedure: TRANSESOPHAGEAL ECHOCARDIOGRAM (TEE);  Surgeon: Skeet Latch, MD;  Location: [place];  Service: Cardiovascular;  Laterality: N/A;  . TEE WITHOUT CARDIOVERSION N/A 08/31/2016   Procedure: TRANSESOPHAGEAL ECHOCARDIOGRAM (TEE);   Surgeon: Burnell Blanks, MD;  Location: [place];  Service: Open Heart Surgery;  Laterality: N/A;  . TEE WITHOUT CARDIOVERSION N/A 12/01/2016   Procedure: TRANSESOPHAGEAL ECHOCARDIOGRAM (TEE);  Surgeon: Larey Dresser, MD;  Location: [facility] ENDOSCOPY;  Service: Cardiovascular;  Laterality: N/A;  . TONSILLECTOMY  1964  . TRANSCATHETER AORTIC VALVE REPLACEMENT, TRANSFEMORAL N/A 08/31/2016   Procedure: TRANSCATHETER AORTIC VALVE REPLACEMENT, TRANSFEMORAL;  Surgeon: Burnell Blanks, MD;  Location: [place];  Service: Open Heart Surgery;  Laterality: N/A;    Current Medications: Outpatient Medications Prior to Visit  Medication Sig Dispense Refill  . acetaminophen (TYLENOL) 325 MG tablet Take 2 tablets (650 mg total) by mouth every 6 (six) hours as needed for mild pain. (Patient taking differently: Take 650 mg by mouth every 6 (six) hours. )    . b complex-vitamin c-folic acid (NEPHRO-VITE) 0.8 MG TABS Take 1 tablet by mouth daily.     . bromocriptine (PARLODEL) 5 MG capsule Take 5 mg by mouth at bedtime.     . cetirizine (ZYRTEC) 10 MG tablet Take 10 mg by mouth at bedtime.     . cinacalcet (SENSIPAR) 30 MG tablet Take 30 mg by mouth 2 (two) times daily with a meal.     . clindamycin (CLEOCIN) 150 MG capsule Take 13 capsules (1,950 mg total) by mouth See admin instructions. Take 2,000 mg by mouth one hour prior to dental procedure 30 capsule 2  . digoxin (LANOXIN) 0.125 MG tablet Take 0.5 tablets (0.0625 mg total) by mouth daily. 15 tablet 3  . diltiazem (CARDIZEM CD) 120 MG 24 hr capsule Take 1 capsule (120 mg total) by mouth daily. 30 capsule 11  . dorzolamide-timolol (COSOPT) 22.3-6.8 MG/ML ophthalmic solution Place 1 drop into both eyes 2 (two) times daily.     . methocarbamol (ROBAXIN) 500 MG tablet Take 1 tablet (500 mg total) by mouth every 6 (six) hours as needed for muscle spasms. 10 tablet 0  . midodrine (PROAMATINE) 10 MG tablet Take 10 mg by mouth See admin instructions. 10 mg by  mouth prior to dialysis and an extra 5 mg as needed for low b/p prior to dialysis (may also take 15 mg as needed for low b/p during dialysis)    . midodrine (PROAMATINE) 5 MG tablet Take 5-15 mg by mouth daily as needed (for a sudden drop in blood pressure).     . midodrine (PROAMATINE) 5 MG tablet TAKE 3 TABLETS BY MOUTH THREE TIMES DAILY AS NEEDED. 270 tablet 3  . Nutritional Supplements (NEPRO) LIQD Take 237 mLs by mouth every Monday, Wednesday, and Friday with hemodialysis.     [name] omeprazole (PRILOSEC) 20 MG capsule Take 20 mg by mouth daily.    . polyethylene glycol (MIRALAX / GLYCOLAX) packet Take  17 g by mouth daily. 14 each 0  . pravastatin (PRAVACHOL) 10 MG tablet Take 1 tablet (10 mg total) by mouth daily. 30 tablet 0  . predniSONE (DELTASONE) 5 MG tablet Take 5 mg by mouth at bedtime.   1  . senna-docusate (SENOKOT-S) 8.6-50 MG tablet Take 1 tablet by mouth at bedtime. (Patient taking differently: Take 1 tablet by mouth at bedtime as needed for mild constipation. ) 14 tablet 0  . sertraline (ZOLOFT) 50 MG tablet Take 50 mg by mouth daily.  5  . sevelamer carbonate (RENVELA) 800 MG tablet Take 800 mg by mouth 3 (three) times daily with meals.    . Tiotropium Bromide-Olodaterol (STIOLTO RESPIMAT) 2.5-2.5 MCG/ACT AERS Inhale 2 puffs into the lungs daily. 1 Inhaler 5  . warfarin (COUMADIN) 5 MG tablet Take 1-1.5 tablets by mouth daily as directed by coumadin clinic (Patient taking differently: Take 2.5-5 mg by mouth See admin instructions. 5 mg by mouth at night on Sun/Tues/Thurs/Sat and 2.5 mg on Mon/Wed/Fri) 120 tablet 1   No facility-administered medications prior to visit.      Allergies:   Penicillins; Atorvastatin; Codeine; and Tramadol   Social History   Socioeconomic History  . Marital status: Married    Spouse name: Not on file  . Number of children: 3  . Years of education: 43  . Highest education level: Not on file  Occupational History  . Occupation: retired-HV[employer]  Music therapist: RETIRED  Social Needs  . Financial resource strain: Not on file  . Food insecurity:    Worry: Not on file    Inability: Not on file  . Transportation needs:    Medical: Not on file    Non-medical: Not on file  Tobacco Use  . Smoking status: Former Smoker    Packs/day: 2.00    Years: 45.00    Pack years: 90.00    Types: Cigarettes    Last attempt to quit: 01/20/1998    Years since quitting: 19.6  . Smokeless tobacco: Never Used  Substance and Sexual Activity  . Alcohol use: No    Alcohol/week: 0.0 oz  . Drug use: No  . Sexual activity: Yes  Lifestyle  . Physical activity:    Days per week: Not on file    Minutes per session: Not on file  . Stress: Not on file  Relationships  . Social connections:    Talks on phone: Not on file    Gets together: Not on file    Attends religious service: Not on file    Active member of club or organization: Not on file    Attends meetings of clubs or organizations: Not on file    Relationship status: Not on file  Other Topics Concern  . Not on file  Social History Narrative   Lives at home w/ his wife   Right-handed   Drinks 1 cup of coffee per day     Family History:  The patient's family history includes Cancer in his brother; Heart attack in his father; Heart disease in his sister; Hypertension in his father; Stomach cancer in his mother; Stroke in his sister.      ROS:   Please see the history of present illness.    ROS All other systems reviewed and are negative.   PHYSICAL EXAM:   VS:  BP (!) 158/62   Pulse 66   Ht 5' 10.5" (1.791 m)   Wt 198 lb (89.8 kg)  SpO2 95%   BMI 28.01 kg/m    GEN: Well nourished, well developed, in no acute distress  HEENT: normal  Neck: no JVD, carotid bruits, or masses Cardiac: irreg irreg. Soft flow murmur. No rubs, or gallops,no edema  Respiratory:  clear to auscultation bilaterally, normal work of breathing GI: soft, nontender, nondistended, + BS MS: no  deformity or atrophy  Skin: warm and dry, no rash Neuro:  Alert and Oriented x 3, Strength and sensation are intact Psych: euthymic mood, full affect   Wt Readings from Last 3 Encounters:  08/04/17 197 lb (89.4 kg)  07/26/17 197 lb (89.4 kg)  06/21/17 196 lb (88.9 kg)      Studies/Labs Reviewed:   EKG:  EKG is NOT ordered today.    Recent Labs: 11/11/2016: NT-Pro BNP 18,028 05/13/2017: Hemoglobin 11.5; Magnesium 1.8; Platelets 150; TSH 1.526 08/23/2017: ALT 37; BUN 32; Creatinine, Ser 6.56; Potassium 3.8; Sodium 144   Lipid Panel    Component Value Date/Time   CHOL 147 08/23/2017 0916   TRIG 236 (H) 08/23/2017 0916   HDL 26 (L) 08/23/2017 0916   CHOLHDL 5.7 (H) 08/23/2017 0916   CHOLHDL 7.5 05/14/2017 0514   VLDL 55 (H) 05/14/2017 0514   LDLCALC 74 08/23/2017 0916    Additional studies/ records that were reviewed today include:   TAVR OPERATIVE NOTE   Date of Procedure:                08/31/2016  Preoperative Diagnosis:      Severe Aortic Stenosis  Procedure:        Transcatheter Aortic Valve Replacement - Percutaneous right Transfemoral Approach             Edwards Sapien 3 THV (size 29 mm, model # 9600TFX, serial # [id number])              Co-Surgeons:            Gaye Pollack, MD and Lauree Chandler, MD           Pre-operative Echo Findings: ? Severe aortic stenosis ? Normal left ventricular systolic function  Post-operative Echo Findings: ? Trivial paravalvular leak ?  Normal left ventricular systolic function  ____________________   2D ECHO 10/07/17 ( 30 day post op echo)  Study Conclusions - Left ventricle: The cavity size was normal. Wall thickness was   increased in a pattern of mild LVH. Systolic function was normal.   The estimated ejection fraction was in the range of 55% to 60%.   Wall motion was normal; there were no regional wall motion   abnormalities. Doppler parameters are consistent with high   ventricular filling pressure. -  Aortic valve: A bioprosthesis was present. - Mitral valve: Calcified annulus. There was mild regurgitation.   Valve area by pressure half-time: 2.08 cm^2. - Left atrium: The atrium was mildly dilated. - Right atrium: The atrium was moderately dilated. - Pulmonary arteries: Systolic pressure was mildly increased. PA   peak pressure: 36 mm Hg (S). Impressions: - Normal LV systolic function; mild LVH; elevated LV filling   pressure; s/p AVR with normal mean gradient (9 mmHg) and   calculated AVA of 1.4 cm2; no AI; mild MR; biatrial enlargement;   mild TR with mildly elevated pulmonary pressure.   2D ECHO 08/25/17 ( 1 year post op) Study Conclusions - Left ventricle: The cavity size was normal. Wall thickness was   increased in a pattern of mild LVH. Systolic function was normal.  The estimated ejection fraction was in the range of 60% to 65%.   Wall motion was normal; there were no regional wall motion   abnormalities. - Aortic valve: A bioprosthesis was present. - Mitral valve: There was mild regurgitation. - Left atrium: The atrium was moderately dilated. - Right atrium: The atrium was moderately to severely dilated. - Tricuspid valve: There was mild-moderate regurgitation. - Pulmonary arteries: Systolic pressure was moderately to severely   increased. PA peak pressure: 65 mm Hg (S).    ASSESSMENT & PLAN:   Severe AS s/p TAVR: 2D ECHO shows EF 55%, normally functioning TAVR valve, no PVL and mean gradient 15 mmHg. He has NHYA class II symptoms. SBE prophylaxis discussed. Clinda 600mg  Rx'd to pharmacy.   Chronic diastolic CHF: appears euvolemic.Volume managed with dialysis  Chronic afib: continue coumadin. Rate well controlled  HTN: BP elevated today (158/62) but he has a long history of hypotension with HD and takes midodrine. He is followed closely by [name] for this. I have not made any medication changes.    Medication Adjustments/Labs and Tests Ordered: Current  medicines are reviewed at length with the patient today.  Concerns regarding medicines are outlined above.  Medication changes, Labs and Tests ordered today are listed in the Patient Instructions below. There are no Patient Instructions on file for this visit.   Signed, Angelena Form, PA-C  08/25/2017 1:08 PM    [facility] [address], [place], [place]  [address] Phone: [phone]; Fax: [phone]

## 2017-08-25 NOTE — Patient Instructions (Signed)
Medication Instructions:  Your physician recommends that you continue on your current medications as directed. Please refer to the Current Medication list given to you today.   Labwork: none  Testing/Procedures: none  Follow-Up: Follow up with [name] as planned on 11/15/17  Any Other Special Instructions Will Be Listed Below (If Applicable).     If you need a refill on your cardiac medications before your next appointment, please call your pharmacy.

## 2017-08-26 DIAGNOSIS — N2581 Secondary hyperparathyroidism of renal origin: Secondary | ICD-10-CM | POA: Diagnosis not present

## 2017-08-26 DIAGNOSIS — D631 Anemia in chronic kidney disease: Secondary | ICD-10-CM | POA: Diagnosis not present

## 2017-08-26 DIAGNOSIS — N186 End stage renal disease: Secondary | ICD-10-CM | POA: Diagnosis not present

## 2017-08-27 DIAGNOSIS — N186 End stage renal disease: Secondary | ICD-10-CM | POA: Diagnosis not present

## 2017-08-27 DIAGNOSIS — Z992 Dependence on renal dialysis: Secondary | ICD-10-CM | POA: Diagnosis not present

## 2017-08-27 DIAGNOSIS — E1129 Type 2 diabetes mellitus with other diabetic kidney complication: Secondary | ICD-10-CM | POA: Diagnosis not present

## 2017-08-29 DIAGNOSIS — D631 Anemia in chronic kidney disease: Secondary | ICD-10-CM | POA: Diagnosis not present

## 2017-08-29 DIAGNOSIS — N186 End stage renal disease: Secondary | ICD-10-CM | POA: Diagnosis not present

## 2017-08-29 DIAGNOSIS — N2581 Secondary hyperparathyroidism of renal origin: Secondary | ICD-10-CM | POA: Diagnosis not present

## 2017-08-31 DIAGNOSIS — N186 End stage renal disease: Secondary | ICD-10-CM | POA: Diagnosis not present

## 2017-08-31 DIAGNOSIS — N2581 Secondary hyperparathyroidism of renal origin: Secondary | ICD-10-CM | POA: Diagnosis not present

## 2017-08-31 DIAGNOSIS — D631 Anemia in chronic kidney disease: Secondary | ICD-10-CM | POA: Diagnosis not present

## 2017-09-02 DIAGNOSIS — N2581 Secondary hyperparathyroidism of renal origin: Secondary | ICD-10-CM | POA: Diagnosis not present

## 2017-09-02 DIAGNOSIS — D631 Anemia in chronic kidney disease: Secondary | ICD-10-CM | POA: Diagnosis not present

## 2017-09-02 DIAGNOSIS — N186 End stage renal disease: Secondary | ICD-10-CM | POA: Diagnosis not present

## 2017-09-05 ENCOUNTER — Encounter: Payer: Self-pay | Admitting: Thoracic Surgery (Cardiothoracic Vascular Surgery)

## 2017-09-05 DIAGNOSIS — D631 Anemia in chronic kidney disease: Secondary | ICD-10-CM | POA: Diagnosis not present

## 2017-09-05 DIAGNOSIS — N186 End stage renal disease: Secondary | ICD-10-CM | POA: Diagnosis not present

## 2017-09-05 DIAGNOSIS — N2581 Secondary hyperparathyroidism of renal origin: Secondary | ICD-10-CM | POA: Diagnosis not present

## 2017-09-07 DIAGNOSIS — M47816 Spondylosis without myelopathy or radiculopathy, lumbar region: Secondary | ICD-10-CM | POA: Diagnosis not present

## 2017-09-07 DIAGNOSIS — I482 Chronic atrial fibrillation: Secondary | ICD-10-CM | POA: Diagnosis not present

## 2017-09-07 DIAGNOSIS — N186 End stage renal disease: Secondary | ICD-10-CM | POA: Diagnosis not present

## 2017-09-07 DIAGNOSIS — N2581 Secondary hyperparathyroidism of renal origin: Secondary | ICD-10-CM | POA: Diagnosis not present

## 2017-09-07 DIAGNOSIS — D631 Anemia in chronic kidney disease: Secondary | ICD-10-CM | POA: Diagnosis not present

## 2017-09-08 ENCOUNTER — Telehealth: Payer: Self-pay | Admitting: Cardiovascular Disease

## 2017-09-08 ENCOUNTER — Other Ambulatory Visit: Payer: Self-pay | Admitting: Cardiovascular Disease

## 2017-09-08 LAB — PROTIME-INR

## 2017-09-08 NOTE — Telephone Encounter (Signed)
Gave last INR information to [place].  Patient was rescheduled for dental work, as last INR was 2 weeks ago.

## 2017-09-08 NOTE — Telephone Encounter (Signed)
New message    [phone] number.   Sharyn Lull is requesting last INR for pt. He is there now.

## 2017-09-09 DIAGNOSIS — N2581 Secondary hyperparathyroidism of renal origin: Secondary | ICD-10-CM | POA: Diagnosis not present

## 2017-09-09 DIAGNOSIS — D631 Anemia in chronic kidney disease: Secondary | ICD-10-CM | POA: Diagnosis not present

## 2017-09-09 DIAGNOSIS — N186 End stage renal disease: Secondary | ICD-10-CM | POA: Diagnosis not present

## 2017-09-12 ENCOUNTER — Ambulatory Visit (INDEPENDENT_AMBULATORY_CARE_PROVIDER_SITE_OTHER): Payer: Medicare Other | Admitting: Pharmacist Clinician (PhC)/ Clinical Pharmacy Specialist

## 2017-09-12 DIAGNOSIS — Z7901 Long term (current) use of anticoagulants: Secondary | ICD-10-CM

## 2017-09-12 DIAGNOSIS — I4891 Unspecified atrial fibrillation: Secondary | ICD-10-CM

## 2017-09-12 DIAGNOSIS — N2581 Secondary hyperparathyroidism of renal origin: Secondary | ICD-10-CM | POA: Diagnosis not present

## 2017-09-12 DIAGNOSIS — N186 End stage renal disease: Secondary | ICD-10-CM | POA: Diagnosis not present

## 2017-09-12 DIAGNOSIS — D631 Anemia in chronic kidney disease: Secondary | ICD-10-CM | POA: Diagnosis not present

## 2017-09-12 LAB — POCT INR: INR: 2.6 (ref 2.0–3.0)

## 2017-09-12 NOTE — Patient Instructions (Signed)
Description   Continue with 1 tablet daily except 1/2 tablet each Monday, Wednesday and Friday.  Repeat INR in 2 weeks

## 2017-09-14 ENCOUNTER — Telehealth: Payer: Self-pay | Admitting: Cardiovascular Disease

## 2017-09-14 DIAGNOSIS — H547 Unspecified visual loss: Secondary | ICD-10-CM | POA: Diagnosis not present

## 2017-09-14 DIAGNOSIS — I10 Essential (primary) hypertension: Secondary | ICD-10-CM | POA: Diagnosis not present

## 2017-09-14 DIAGNOSIS — E1121 Type 2 diabetes mellitus with diabetic nephropathy: Secondary | ICD-10-CM | POA: Diagnosis not present

## 2017-09-14 DIAGNOSIS — N186 End stage renal disease: Secondary | ICD-10-CM | POA: Diagnosis not present

## 2017-09-14 DIAGNOSIS — Z8679 Personal history of other diseases of the circulatory system: Secondary | ICD-10-CM | POA: Diagnosis not present

## 2017-09-14 DIAGNOSIS — N62 Hypertrophy of breast: Secondary | ICD-10-CM | POA: Diagnosis not present

## 2017-09-14 DIAGNOSIS — N2581 Secondary hyperparathyroidism of renal origin: Secondary | ICD-10-CM | POA: Diagnosis not present

## 2017-09-14 DIAGNOSIS — D631 Anemia in chronic kidney disease: Secondary | ICD-10-CM | POA: Diagnosis not present

## 2017-09-14 NOTE — Telephone Encounter (Signed)
New Message:       Pt's daughter states that something is just not right with her dad. She states his speech and things are not right but he is not complaining of any issues. But she states she would feel better if she spoke with a Therapist, sports. But all of his vitals are fine.

## 2017-09-14 NOTE — Telephone Encounter (Signed)
Spoke with daughter and she will call PCP Per daughter patient would like to lower his Midodrine dose to 5 mg daily since he has not had to take any of the Midodrine during dialysis. Will forward to Dr Oval Linsey for review

## 2017-09-14 NOTE — Telephone Encounter (Signed)
Returned the call to the daughter. She stated that she feels like her father is declining but could not state why she feels this way. She stated that it was hard to explain. She would like for the patient to be seen earlier that his August appointment. Message has been routed to the provider's nurse.

## 2017-09-15 DIAGNOSIS — R300 Dysuria: Secondary | ICD-10-CM | POA: Diagnosis not present

## 2017-09-15 DIAGNOSIS — N390 Urinary tract infection, site not specified: Secondary | ICD-10-CM | POA: Diagnosis not present

## 2017-09-15 NOTE — Telephone Encounter (Signed)
I'm glad he isn't needing as much midodrine and reducing to 5mg  is fine.

## 2017-09-16 DIAGNOSIS — N186 End stage renal disease: Secondary | ICD-10-CM | POA: Diagnosis not present

## 2017-09-16 DIAGNOSIS — D631 Anemia in chronic kidney disease: Secondary | ICD-10-CM | POA: Diagnosis not present

## 2017-09-16 DIAGNOSIS — N2581 Secondary hyperparathyroidism of renal origin: Secondary | ICD-10-CM | POA: Diagnosis not present

## 2017-09-16 NOTE — Telephone Encounter (Signed)
Spoke with daughter and patient does have UTI, being treated with antibiotics. Advise ok to decrease Midodrine to 5 mg daily and see how he does. She will call back in a couple weeks to update on how he is doing

## 2017-09-19 DIAGNOSIS — N186 End stage renal disease: Secondary | ICD-10-CM | POA: Diagnosis not present

## 2017-09-19 DIAGNOSIS — N2581 Secondary hyperparathyroidism of renal origin: Secondary | ICD-10-CM | POA: Diagnosis not present

## 2017-09-19 DIAGNOSIS — N390 Urinary tract infection, site not specified: Secondary | ICD-10-CM | POA: Diagnosis not present

## 2017-09-19 DIAGNOSIS — R319 Hematuria, unspecified: Secondary | ICD-10-CM | POA: Diagnosis not present

## 2017-09-19 DIAGNOSIS — D631 Anemia in chronic kidney disease: Secondary | ICD-10-CM | POA: Diagnosis not present

## 2017-09-21 DIAGNOSIS — D631 Anemia in chronic kidney disease: Secondary | ICD-10-CM | POA: Diagnosis not present

## 2017-09-21 DIAGNOSIS — N2581 Secondary hyperparathyroidism of renal origin: Secondary | ICD-10-CM | POA: Diagnosis not present

## 2017-09-21 DIAGNOSIS — N186 End stage renal disease: Secondary | ICD-10-CM | POA: Diagnosis not present

## 2017-09-22 DIAGNOSIS — N3001 Acute cystitis with hematuria: Secondary | ICD-10-CM | POA: Diagnosis not present

## 2017-09-22 DIAGNOSIS — R399 Unspecified symptoms and signs involving the genitourinary system: Secondary | ICD-10-CM | POA: Diagnosis not present

## 2017-09-23 DIAGNOSIS — N2581 Secondary hyperparathyroidism of renal origin: Secondary | ICD-10-CM | POA: Diagnosis not present

## 2017-09-23 DIAGNOSIS — N186 End stage renal disease: Secondary | ICD-10-CM | POA: Diagnosis not present

## 2017-09-23 DIAGNOSIS — D631 Anemia in chronic kidney disease: Secondary | ICD-10-CM | POA: Diagnosis not present

## 2017-09-26 ENCOUNTER — Ambulatory Visit (INDEPENDENT_AMBULATORY_CARE_PROVIDER_SITE_OTHER): Payer: Medicare Other | Admitting: Pharmacist Clinician (PhC)/ Clinical Pharmacy Specialist

## 2017-09-26 DIAGNOSIS — Z7901 Long term (current) use of anticoagulants: Secondary | ICD-10-CM

## 2017-09-26 DIAGNOSIS — N390 Urinary tract infection, site not specified: Secondary | ICD-10-CM | POA: Diagnosis not present

## 2017-09-26 DIAGNOSIS — N186 End stage renal disease: Secondary | ICD-10-CM | POA: Diagnosis not present

## 2017-09-26 DIAGNOSIS — I4891 Unspecified atrial fibrillation: Secondary | ICD-10-CM | POA: Diagnosis not present

## 2017-09-26 DIAGNOSIS — D631 Anemia in chronic kidney disease: Secondary | ICD-10-CM | POA: Diagnosis not present

## 2017-09-26 DIAGNOSIS — N2581 Secondary hyperparathyroidism of renal origin: Secondary | ICD-10-CM | POA: Diagnosis not present

## 2017-09-26 DIAGNOSIS — E1129 Type 2 diabetes mellitus with other diabetic kidney complication: Secondary | ICD-10-CM | POA: Diagnosis not present

## 2017-09-26 DIAGNOSIS — Z992 Dependence on renal dialysis: Secondary | ICD-10-CM | POA: Diagnosis not present

## 2017-09-26 LAB — POCT INR: INR: 2.2 (ref 2.0–3.0)

## 2017-09-28 DIAGNOSIS — N2581 Secondary hyperparathyroidism of renal origin: Secondary | ICD-10-CM | POA: Diagnosis not present

## 2017-09-28 DIAGNOSIS — N390 Urinary tract infection, site not specified: Secondary | ICD-10-CM | POA: Diagnosis not present

## 2017-09-28 DIAGNOSIS — D631 Anemia in chronic kidney disease: Secondary | ICD-10-CM | POA: Diagnosis not present

## 2017-09-28 DIAGNOSIS — N186 End stage renal disease: Secondary | ICD-10-CM | POA: Diagnosis not present

## 2017-09-30 DIAGNOSIS — N186 End stage renal disease: Secondary | ICD-10-CM | POA: Diagnosis not present

## 2017-09-30 DIAGNOSIS — N2581 Secondary hyperparathyroidism of renal origin: Secondary | ICD-10-CM | POA: Diagnosis not present

## 2017-09-30 DIAGNOSIS — N390 Urinary tract infection, site not specified: Secondary | ICD-10-CM | POA: Diagnosis not present

## 2017-09-30 DIAGNOSIS — D631 Anemia in chronic kidney disease: Secondary | ICD-10-CM | POA: Diagnosis not present

## 2017-10-03 DIAGNOSIS — N390 Urinary tract infection, site not specified: Secondary | ICD-10-CM | POA: Diagnosis not present

## 2017-10-03 DIAGNOSIS — N2581 Secondary hyperparathyroidism of renal origin: Secondary | ICD-10-CM | POA: Diagnosis not present

## 2017-10-03 DIAGNOSIS — D631 Anemia in chronic kidney disease: Secondary | ICD-10-CM | POA: Diagnosis not present

## 2017-10-03 DIAGNOSIS — N186 End stage renal disease: Secondary | ICD-10-CM | POA: Diagnosis not present

## 2017-10-05 DIAGNOSIS — N186 End stage renal disease: Secondary | ICD-10-CM | POA: Diagnosis not present

## 2017-10-05 DIAGNOSIS — N2581 Secondary hyperparathyroidism of renal origin: Secondary | ICD-10-CM | POA: Diagnosis not present

## 2017-10-05 DIAGNOSIS — D631 Anemia in chronic kidney disease: Secondary | ICD-10-CM | POA: Diagnosis not present

## 2017-10-05 DIAGNOSIS — N390 Urinary tract infection, site not specified: Secondary | ICD-10-CM | POA: Diagnosis not present

## 2017-10-06 DIAGNOSIS — N3001 Acute cystitis with hematuria: Secondary | ICD-10-CM | POA: Diagnosis not present

## 2017-10-07 DIAGNOSIS — N390 Urinary tract infection, site not specified: Secondary | ICD-10-CM | POA: Diagnosis not present

## 2017-10-07 DIAGNOSIS — E1129 Type 2 diabetes mellitus with other diabetic kidney complication: Secondary | ICD-10-CM | POA: Diagnosis not present

## 2017-10-07 DIAGNOSIS — N2581 Secondary hyperparathyroidism of renal origin: Secondary | ICD-10-CM | POA: Diagnosis not present

## 2017-10-07 DIAGNOSIS — N186 End stage renal disease: Secondary | ICD-10-CM | POA: Diagnosis not present

## 2017-10-07 DIAGNOSIS — I482 Chronic atrial fibrillation: Secondary | ICD-10-CM | POA: Diagnosis not present

## 2017-10-07 DIAGNOSIS — D631 Anemia in chronic kidney disease: Secondary | ICD-10-CM | POA: Diagnosis not present

## 2017-10-10 DIAGNOSIS — D631 Anemia in chronic kidney disease: Secondary | ICD-10-CM | POA: Diagnosis not present

## 2017-10-10 DIAGNOSIS — N2581 Secondary hyperparathyroidism of renal origin: Secondary | ICD-10-CM | POA: Diagnosis not present

## 2017-10-10 DIAGNOSIS — N186 End stage renal disease: Secondary | ICD-10-CM | POA: Diagnosis not present

## 2017-10-10 DIAGNOSIS — N390 Urinary tract infection, site not specified: Secondary | ICD-10-CM | POA: Diagnosis not present

## 2017-10-12 DIAGNOSIS — N186 End stage renal disease: Secondary | ICD-10-CM | POA: Diagnosis not present

## 2017-10-12 DIAGNOSIS — N3000 Acute cystitis without hematuria: Secondary | ICD-10-CM | POA: Diagnosis not present

## 2017-10-12 DIAGNOSIS — N390 Urinary tract infection, site not specified: Secondary | ICD-10-CM | POA: Diagnosis not present

## 2017-10-12 DIAGNOSIS — N2581 Secondary hyperparathyroidism of renal origin: Secondary | ICD-10-CM | POA: Diagnosis not present

## 2017-10-12 DIAGNOSIS — D631 Anemia in chronic kidney disease: Secondary | ICD-10-CM | POA: Diagnosis not present

## 2017-10-14 DIAGNOSIS — D631 Anemia in chronic kidney disease: Secondary | ICD-10-CM | POA: Diagnosis not present

## 2017-10-14 DIAGNOSIS — N390 Urinary tract infection, site not specified: Secondary | ICD-10-CM | POA: Diagnosis not present

## 2017-10-14 DIAGNOSIS — N186 End stage renal disease: Secondary | ICD-10-CM | POA: Diagnosis not present

## 2017-10-14 DIAGNOSIS — N2581 Secondary hyperparathyroidism of renal origin: Secondary | ICD-10-CM | POA: Diagnosis not present

## 2017-10-17 DIAGNOSIS — N2581 Secondary hyperparathyroidism of renal origin: Secondary | ICD-10-CM | POA: Diagnosis not present

## 2017-10-17 DIAGNOSIS — N186 End stage renal disease: Secondary | ICD-10-CM | POA: Diagnosis not present

## 2017-10-17 DIAGNOSIS — D631 Anemia in chronic kidney disease: Secondary | ICD-10-CM | POA: Diagnosis not present

## 2017-10-17 DIAGNOSIS — N390 Urinary tract infection, site not specified: Secondary | ICD-10-CM | POA: Diagnosis not present

## 2017-10-19 DIAGNOSIS — D631 Anemia in chronic kidney disease: Secondary | ICD-10-CM | POA: Diagnosis not present

## 2017-10-19 DIAGNOSIS — N186 End stage renal disease: Secondary | ICD-10-CM | POA: Diagnosis not present

## 2017-10-19 DIAGNOSIS — N2581 Secondary hyperparathyroidism of renal origin: Secondary | ICD-10-CM | POA: Diagnosis not present

## 2017-10-19 DIAGNOSIS — N390 Urinary tract infection, site not specified: Secondary | ICD-10-CM | POA: Diagnosis not present

## 2017-10-20 ENCOUNTER — Telehealth: Payer: Self-pay | Admitting: *Deleted

## 2017-10-20 ENCOUNTER — Encounter: Payer: Self-pay | Admitting: Infectious Diseases

## 2017-10-20 ENCOUNTER — Ambulatory Visit (INDEPENDENT_AMBULATORY_CARE_PROVIDER_SITE_OTHER): Payer: Medicare Other | Admitting: Infectious Diseases

## 2017-10-20 DIAGNOSIS — R310 Gross hematuria: Secondary | ICD-10-CM

## 2017-10-20 DIAGNOSIS — N390 Urinary tract infection, site not specified: Secondary | ICD-10-CM | POA: Insufficient documentation

## 2017-10-20 DIAGNOSIS — N3001 Acute cystitis with hematuria: Secondary | ICD-10-CM | POA: Diagnosis not present

## 2017-10-20 NOTE — Patient Instructions (Signed)
Will continue with the Vancomycin to treat your bladder infection. I would expect to see that by next Wednesday you would have improvement in your symptoms.   If there is not improvement please call and let me know so we can decide on next steps.   Will plan on calling you Friday afternoon of next week 10/28/17 to check in if I do not hear from you sooner.

## 2017-10-20 NOTE — Assessment & Plan Note (Addendum)
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 It is sensitive to penicillins but he is not willing to treat with amoxicillin due to previous non-anaphylactic allergy to penicillin. He does not get these symptoms frequently and has had partial result with the one dose of vancomycin he received. We discussed alternatively using Fosfomycin 3gm x1 PO however they preferred to continue with vancomycin IV since he had some benefit from it earlier. 90 kg - dose 1250 mg IV after HD sessions to achieve trough closer to 15. Discussed with our ID pharmacist Raquel Sarna. Will plan on treating through 4 sessions - I will call them Friday to see how he is doing but asked to please call if no improvement in symptoms Wednesday or if new symptoms develop especially fevers/chills.   Fosfomycin is a reasonable choice in the future, but can be expensive.

## 2017-10-20 NOTE — Progress Notes (Signed)
Patient: Alexander [name]  DOB: 1940/06/02 MRN: [id number] PCP: Deland Pretty, MD  Referring Provider: [facility],   Patient Active Problem List   Diagnosis Date Noted  . Hematuria 10/21/2017  . Urinary tract infection 10/20/2017  . Syncope 05/12/2017  . Near syncope 05/12/2017  . Elevated troponin 05/12/2017  . CVA (cerebral vascular accident) ([place]) 04/12/2017  . Dialysis patient ([place]) 04/12/2017  . Seasonal allergies   . S/P epidural steroid injection   . Peptic ulcer   . Paroxysmal atrial fibrillation (HCC)   . Macular degeneration   . Legally blind   . Kidney stones   . Hypertension   . ESRD (end stage renal disease) ([place])   . Dyspnea   . Diverticulosis   . Diabetes mellitus without complication ([place])   . Colon polyps   . Anemia   . Presence of permanent cardiac pacemaker 11/24/2016  . Atrial fibrillation with RVR ([place]) 11/23/2016  . Glaucoma 11/23/2016  . GERD (gastroesophageal reflux disease) 11/23/2016  . Hypomagnesemia 11/23/2016  . Diabetes mellitus, type II ([place]) 11/23/2016  . Depression 11/23/2016  . S/P TAVR (transcatheter aortic valve replacement) 08/31/2016  . Long term (current) use of anticoagulants [Z79.01] 08/13/2016  . Anticoagulated on Coumadin 01/19/2016  . Impacted cerumen of left ear 01/19/2016  . Otorrhagia of right ear 01/19/2016  . Sudden right hearing loss 01/19/2016  . Persistent atrial fibrillation ([place])   . Carotid occlusion, left 11/04/2015  . Carpal tunnel syndrome, bilateral 11/03/2015  . Diabetic peripheral neuropathy ([place]) 10/02/2015  . Orthostatic hypotension 09/09/2015  . Bradycardia 03/30/2015  . OAG (open angle glaucoma) suspect, high risk, left 05/28/2014  . Long-term (current) use of anticoagulants 04/08/2014  . Hyperlipidemia 04/04/2014  . Primary open-angle glaucoma 07/10/2013  . ESRD (end stage renal disease) on dialysis ([place]) 03/13/2013  . Pseudophakia 01/02/2013  . PVD (posterior vitreous  detachment) 01/02/2013  . PAD (peripheral artery disease) ([place]) 12/12/2012  . Occlusion and stenosis of bilateral carotid arteries 08/17/2012  . Severe aortic stenosis 06/15/2012  . Aortic stenosis 06/15/2012  . Organ transplant candidate 01/20/2012  . Exudative macular degeneration ([place]) 12/02/2011  . Retinal detachment 12/02/2011  . Personal history of colonic polyps 02/03/2011  . Esophageal reflux 02/03/2011  . Anemia, unspecified 02/03/2011  . Barrett's esophagus 05/28/2003  . Tubular adenoma of colon 07/27/2001     Subjective:  Alexander [name] is a 77 y.o. male here for evaluation of UTI. Alexander [name] is here today with his wife and daughter. He has a significant past medical history including ESRD on HD M/W/F for the last 8 years now.   He reports pain with urination, difficulty starting urine stream and hematuria (bright red like Koolaid). He has tried 3 different antibiotics to treat these symptoms (Ciprofloxacin, Doxycycline, Nitrofurantoin) without any relief. Symptoms have been going on for 4 weeks now. He reports no frequent chills/fevers or night sweats from what he can tell but his wife describes him to be "unpredictable" with symptoms. He has only had an infection like this one other time last summer where he wound up in the hospital for 10 days with urosepsis. He does make urine and always has but reports that he is now going 3-4 times a day now which is abnormal. On 09/23/17 urine culture revealed Enterococcus Faecalis 100k colony count. His urologist ordered him only one dose of vancomycin that was given last Friday (has since had HD twice). He does think he had some improvement with symptoms then  but they have since returned.   His family is very concerned about the hematuria and wondering where that is coming from. He does have a distant history of kidney stone but no flank/back pain to report presently; he is on coumadin chronically. He does have a documented allergy to penicillin  (rash) and he is not willing to retrial this.   Review of Systems  All other systems reviewed and are negative.   Past Medical History:  Diagnosis Date  . Anemia   . Aortic stenosis 06/15/12   TEE - EF 18-84%; grade 1 diastolic dysfunction; mild/mod aortic valve stenosis; Mitral valve had calcified annulus, mild pulm htn PA peak pressure 30mmHg  . Barrett's esophagus 05/2003  . Bradycardia 2017   [employer] 2240 Assurity dual-lead pacemaker  . Carpal tunnel syndrome, bilateral 11/03/2015  . Colon polyps   . CVA (cerebral infarction)    2004/affected left side  . Depression   . Diabetes mellitus without complication ([place])   . Diabetic peripheral neuropathy ([place]) 10/02/2015  . Diverticulosis   . Dyspnea    with exertion  . ESRD (end stage renal disease) on dialysis ([place])    "[facility]; [place]; MWF" (05/12/2017)  . GERD (gastroesophageal reflux disease)   . Glaucoma   . History of kidney stones   . Hyperlipidemia   . Hypertension   . Legally blind   . Macular degeneration    both eyes  . Orthostatic hypotension 09/09/2015  . Paroxysmal atrial fibrillation (HCC)   . Peptic ulcer    bleeding, 1969  . Presence of permanent cardiac pacemaker   . S/P epidural steroid injection    last  injection over 10 years ago  . Seasonal allergies   . Tubular adenoma of colon 07/2001    Outpatient Medications Prior to Visit  Medication Sig Dispense Refill  . acetaminophen (TYLENOL) 325 MG tablet Take 325 mg by mouth every 6 (six) hours as needed.    [name] b complex-vitamin c-folic acid (NEPHRO-VITE) 0.8 MG TABS Take 1 tablet by mouth daily.     . bromocriptine (PARLODEL) 5 MG capsule Take 5 mg by mouth at bedtime.     . cetirizine (ZYRTEC) 10 MG tablet Take 10 mg by mouth at bedtime.     . cinacalcet (SENSIPAR) 30 MG tablet Take 30 mg by mouth 2 (two) times daily with a meal.     . clindamycin (CLEOCIN) 300 MG capsule Take 2 tablets by mouth one hour prior to dental appointments 2 capsule 3  .  DIGOX 125 MCG tablet TAKE 1/2 TABLET BY MOUTH EVERY DAY 15 tablet 4  . dorzolamide-timolol (COSOPT) 22.3-6.8 MG/ML ophthalmic solution Place 1 drop into both eyes 2 (two) times daily.     . midodrine (PROAMATINE) 10 MG tablet Take 10 mg by mouth See admin instructions. 10 mg by mouth prior to dialysis and an extra 5 mg as needed for low b/p prior to dialysis (may also take 15 mg as needed for low b/p during dialysis)    . midodrine (PROAMATINE) 5 MG tablet Take 5-15 mg by mouth daily as needed (for a sudden drop in blood pressure).     . midodrine (PROAMATINE) 5 MG tablet TAKE 3 TABLETS BY MOUTH THREE TIMES DAILY AS NEEDED. 270 tablet 3  . Nutritional Supplements (NEPRO) LIQD Take 237 mLs by mouth every Monday, Wednesday, and Friday with hemodialysis.     [name] omeprazole (PRILOSEC) 20 MG capsule Take 20 mg by mouth daily.    [name]  polyethylene glycol (MIRALAX / GLYCOLAX) packet Take 17 g by mouth daily. 14 each 0  . pravastatin (PRAVACHOL) 10 MG tablet Take 1 tablet (10 mg total) by mouth daily. 30 tablet 0  . predniSONE (DELTASONE) 5 MG tablet Take 5 mg by mouth at bedtime.   1  . senna (SENOKOT) 8.6 MG tablet Take 1 tablet by mouth as needed for constipation.    . sertraline (ZOLOFT) 50 MG tablet Take 50 mg by mouth daily.  5  . sevelamer carbonate (RENVELA) 800 MG tablet Take 800 mg by mouth 3 (three) times daily with meals.    . Tiotropium Bromide-Olodaterol (STIOLTO RESPIMAT) 2.5-2.5 MCG/ACT AERS Inhale 2 puffs into the lungs daily. 1 Inhaler 5  . warfarin (COUMADIN) 5 MG tablet Take 1-1.5 tablets by mouth daily as directed by coumadin clinic (Patient taking differently: Take 2.5-5 mg by mouth See admin instructions. 5 mg by mouth at night on Sun/Tues/Thurs/Sat and 2.5 mg on Mon/Wed/Fri) 120 tablet 1  . diltiazem (CARDIZEM CD) 120 MG 24 hr capsule Take 1 capsule (120 mg total) by mouth daily. 30 capsule 11   No facility-administered medications prior to visit.      Allergies  Allergen Reactions    . Penicillins Swelling and Rash    Has patient had a PCN reaction causing immediate rash, facial/tongue/throat swelling, SOB or lightheadedness with hypotension: Yes Has patient had a PCN reaction causing severe rash involving mucus membranes or skin necrosis: No Has patient had a PCN reaction that required hospitalization: No Has patient had a PCN reaction occurring within the last 10 years: No If all of the above answers are "NO", then may proceed with Cephalosporin use.   . Atorvastatin     MYALGIA   . Codeine Nausea Only  . Tramadol Nausea Only    Social History   Tobacco Use  . Smoking status: Former Smoker    Packs/day: 2.00    Years: 45.00    Pack years: 90.00    Types: Cigarettes    Last attempt to quit: 01/20/1998    Years since quitting: 19.7  . Smokeless tobacco: Never Used  Substance Use Topics  . Alcohol use: No    Alcohol/week: 0.0 oz  . Drug use: No    Family History  Problem Relation Age of Onset  . Stomach cancer Mother   . Hypertension Father        Died of heart attack  . Heart attack Father   . Stroke Sister   . Heart disease Sister   . Cancer Brother   . Colon cancer Neg Hx     Objective:   Vitals:   10/20/17 1435  BP: (!) 163/64  Pulse: 65   There is no height or weight on file to calculate BMI.  Physical Exam  Constitutional: He is oriented to person, place, and time. He appears well-developed and well-nourished.  Seated comfortably in chair during visit. Well groomed. Daughter and wife accompany him.   HENT:  Mouth/Throat: Oropharynx is clear and moist and mucous membranes are normal. Normal dentition. No dental abscesses.  Eyes:  Blind  Cardiovascular: Normal rate and regular rhythm.  Pulmonary/Chest: Effort normal. No respiratory distress.  Abdominal: He exhibits no distension. There is tenderness.  Genitourinary:  Genitourinary Comments: Urine his wife brought him bright red; approx 80cc of urine that was given in one void  earlier this morning. Some sediment present.   Musculoskeletal: Normal range of motion.  Lymphadenopathy:    He has  no cervical adenopathy.  Neurological: He is alert and oriented to person, place, and time.  Skin: Skin is warm and dry. No rash noted.  Psychiatric: He has a normal mood and affect. Judgment normal.  In good spirits today and engaged in care discussion.     Lab Results: Lab Results  Component Value Date   WBC 9.3 05/13/2017   HGB 11.5 (L) 05/13/2017   HCT 36.6 (L) 05/13/2017   MCV 101.9 (H) 05/13/2017   PLT 150 05/13/2017    Lab Results  Component Value Date   CREATININE 6.56 (H) 08/23/2017   BUN 32 (H) 08/23/2017   NA 144 08/23/2017   K 3.8 08/23/2017   CL 96 08/23/2017   CO2 31 (H) 08/23/2017    Lab Results  Component Value Date   ALT 37 08/23/2017   AST 22 08/23/2017   ALKPHOS 85 08/23/2017   BILITOT 0.6 08/23/2017     Assessment & Plan:   Problem List Items Addressed This Visit      Genitourinary   Urinary tract infection    Enterococcus Faecalis with drug resistance noted to tetracycline, cipro/levofloxacin. It is sensitive to penicillins but he is not willing to treat with amoxicillin due to previous non-anaphylactic allergy to penicillin. He does not get these symptoms frequently and has had partial result with the one dose of vancomycin he received. We discussed alternatively using Fosfomycin 3gm x1 PO however they preferred to continue with vancomycin IV since he had some benefit from it earlier. 90 kg - dose 1250 mg IV after HD sessions to achieve trough closer to 15. Discussed with our ID pharmacist Raquel Sarna. Will plan on treating through 4 sessions - I will call them Friday to see how he is doing but asked to please call if no improvement in symptoms Wednesday or if new symptoms develop especially fevers/chills.   Fosfomycin is a reasonable choice in the future, but can be expensive.         Other   Hematuria    Gross hematuria noted with  sample his wife brought to me today. I am not certain this completely explained by cystitis/UTI on warfarin therapy. His INRs have been variable but not too high.  If his hematuria does not clear would ask urology to consider cystoscopy for further evaluation to rule out anatomical explaination (obstruction with bladder cancer causing difficulty urinating + hematuria and secondary UTI?)         Janene Madeira, MSN, NP-C [facility] Pager: [phone] Office: [phone]  10/21/17  6:40 PM

## 2017-10-20 NOTE — Telephone Encounter (Signed)
Per verbal from [place] called the [facility] center to give verbal order for Vancyomycin 1250 mg IV after Hemodialysis x 3 doses starting 10/21/17. Was informed by Servando Salina RN that the order has to be faxed will let [place] know and fax order to [phone] once signed.

## 2017-10-21 ENCOUNTER — Encounter: Payer: Self-pay | Admitting: Infectious Diseases

## 2017-10-21 DIAGNOSIS — N186 End stage renal disease: Secondary | ICD-10-CM | POA: Diagnosis not present

## 2017-10-21 DIAGNOSIS — N390 Urinary tract infection, site not specified: Secondary | ICD-10-CM | POA: Diagnosis not present

## 2017-10-21 DIAGNOSIS — N2581 Secondary hyperparathyroidism of renal origin: Secondary | ICD-10-CM | POA: Diagnosis not present

## 2017-10-21 DIAGNOSIS — R319 Hematuria, unspecified: Secondary | ICD-10-CM | POA: Insufficient documentation

## 2017-10-21 DIAGNOSIS — D631 Anemia in chronic kidney disease: Secondary | ICD-10-CM | POA: Diagnosis not present

## 2017-10-21 NOTE — Assessment & Plan Note (Signed)
Gross hematuria noted with sample his wife brought to me today. I am not certain this completely explained by cystitis/UTI on warfarin therapy. His INRs have been variable but not too high.  If his hematuria does not clear would ask urology to consider cystoscopy for further evaluation to rule out anatomical explaination (obstruction with bladder cancer causing difficulty urinating + hematuria and secondary UTI?)

## 2017-10-24 DIAGNOSIS — N390 Urinary tract infection, site not specified: Secondary | ICD-10-CM | POA: Diagnosis not present

## 2017-10-24 DIAGNOSIS — D631 Anemia in chronic kidney disease: Secondary | ICD-10-CM | POA: Diagnosis not present

## 2017-10-24 DIAGNOSIS — N2581 Secondary hyperparathyroidism of renal origin: Secondary | ICD-10-CM | POA: Diagnosis not present

## 2017-10-24 DIAGNOSIS — N186 End stage renal disease: Secondary | ICD-10-CM | POA: Diagnosis not present

## 2017-10-25 DIAGNOSIS — N62 Hypertrophy of breast: Secondary | ICD-10-CM | POA: Diagnosis not present

## 2017-10-26 ENCOUNTER — Ambulatory Visit (INDEPENDENT_AMBULATORY_CARE_PROVIDER_SITE_OTHER): Payer: Medicare Other | Admitting: Pharmacist Clinician (PhC)/ Clinical Pharmacy Specialist

## 2017-10-26 DIAGNOSIS — I4891 Unspecified atrial fibrillation: Secondary | ICD-10-CM | POA: Diagnosis not present

## 2017-10-26 DIAGNOSIS — Z7901 Long term (current) use of anticoagulants: Secondary | ICD-10-CM | POA: Diagnosis not present

## 2017-10-26 DIAGNOSIS — N390 Urinary tract infection, site not specified: Secondary | ICD-10-CM | POA: Diagnosis not present

## 2017-10-26 DIAGNOSIS — N186 End stage renal disease: Secondary | ICD-10-CM | POA: Diagnosis not present

## 2017-10-26 DIAGNOSIS — D631 Anemia in chronic kidney disease: Secondary | ICD-10-CM | POA: Diagnosis not present

## 2017-10-26 DIAGNOSIS — N2581 Secondary hyperparathyroidism of renal origin: Secondary | ICD-10-CM | POA: Diagnosis not present

## 2017-10-26 LAB — POCT INR: INR: 1.6 — AB (ref 2.0–3.0)

## 2017-10-27 ENCOUNTER — Ambulatory Visit: Payer: Medicare Other | Admitting: Infectious Diseases

## 2017-10-27 DIAGNOSIS — N186 End stage renal disease: Secondary | ICD-10-CM | POA: Diagnosis not present

## 2017-10-27 DIAGNOSIS — E1129 Type 2 diabetes mellitus with other diabetic kidney complication: Secondary | ICD-10-CM | POA: Diagnosis not present

## 2017-10-27 DIAGNOSIS — Z992 Dependence on renal dialysis: Secondary | ICD-10-CM | POA: Diagnosis not present

## 2017-10-28 ENCOUNTER — Inpatient Hospital Stay (HOSPITAL_COMMUNITY)
Admission: EM | Admit: 2017-10-28 | Discharge: 2017-11-02 | DRG: 689 | Disposition: A | Discharge status: Home / Self Care | Payer: Medicare Other | Attending: Internal Medicine | Admitting: Internal Medicine

## 2017-10-28 ENCOUNTER — Telehealth: Payer: Self-pay | Admitting: Infectious Diseases

## 2017-10-28 ENCOUNTER — Emergency Department (HOSPITAL_COMMUNITY): Payer: Medicare Other

## 2017-10-28 ENCOUNTER — Encounter (HOSPITAL_COMMUNITY): Payer: Self-pay | Admitting: Emergency Medicine

## 2017-10-28 DIAGNOSIS — B952 Enterococcus as the cause of diseases classified elsewhere: Secondary | ICD-10-CM | POA: Diagnosis not present

## 2017-10-28 DIAGNOSIS — R5381 Other malaise: Secondary | ICD-10-CM | POA: Diagnosis present

## 2017-10-28 DIAGNOSIS — H3530 Unspecified macular degeneration: Secondary | ICD-10-CM | POA: Diagnosis present

## 2017-10-28 DIAGNOSIS — R531 Weakness: Secondary | ICD-10-CM

## 2017-10-28 DIAGNOSIS — I120 Hypertensive chronic kidney disease with stage 5 chronic kidney disease or end stage renal disease: Secondary | ICD-10-CM | POA: Diagnosis present

## 2017-10-28 DIAGNOSIS — I481 Persistent atrial fibrillation: Secondary | ICD-10-CM | POA: Diagnosis present

## 2017-10-28 DIAGNOSIS — Z952 Presence of prosthetic heart valve: Secondary | ICD-10-CM

## 2017-10-28 DIAGNOSIS — Z7901 Long term (current) use of anticoagulants: Secondary | ICD-10-CM

## 2017-10-28 DIAGNOSIS — Z992 Dependence on renal dialysis: Secondary | ICD-10-CM

## 2017-10-28 DIAGNOSIS — I5030 Unspecified diastolic (congestive) heart failure: Secondary | ICD-10-CM | POA: Diagnosis present

## 2017-10-28 DIAGNOSIS — B955 Unspecified streptococcus as the cause of diseases classified elsewhere: Secondary | ICD-10-CM | POA: Diagnosis not present

## 2017-10-28 DIAGNOSIS — N186 End stage renal disease: Secondary | ICD-10-CM | POA: Diagnosis not present

## 2017-10-28 DIAGNOSIS — Z950 Presence of cardiac pacemaker: Secondary | ICD-10-CM | POA: Diagnosis not present

## 2017-10-28 DIAGNOSIS — N2581 Secondary hyperparathyroidism of renal origin: Secondary | ICD-10-CM | POA: Diagnosis not present

## 2017-10-28 DIAGNOSIS — E1122 Type 2 diabetes mellitus with diabetic chronic kidney disease: Secondary | ICD-10-CM | POA: Diagnosis present

## 2017-10-28 DIAGNOSIS — I4819 Other persistent atrial fibrillation: Secondary | ICD-10-CM | POA: Diagnosis present

## 2017-10-28 DIAGNOSIS — Z87442 Personal history of urinary calculi: Secondary | ICD-10-CM

## 2017-10-28 DIAGNOSIS — Z87891 Personal history of nicotine dependence: Secondary | ICD-10-CM

## 2017-10-28 DIAGNOSIS — Z8249 Family history of ischemic heart disease and other diseases of the circulatory system: Secondary | ICD-10-CM

## 2017-10-28 DIAGNOSIS — Z7952 Long term (current) use of systemic steroids: Secondary | ICD-10-CM

## 2017-10-28 DIAGNOSIS — D631 Anemia in chronic kidney disease: Secondary | ICD-10-CM | POA: Diagnosis not present

## 2017-10-28 DIAGNOSIS — E872 Acidosis: Secondary | ICD-10-CM | POA: Diagnosis not present

## 2017-10-28 DIAGNOSIS — Z800 Family history of malignant neoplasm of digestive organs: Secondary | ICD-10-CM

## 2017-10-28 DIAGNOSIS — E876 Hypokalemia: Secondary | ICD-10-CM | POA: Diagnosis not present

## 2017-10-28 DIAGNOSIS — N3001 Acute cystitis with hematuria: Principal | ICD-10-CM

## 2017-10-28 DIAGNOSIS — Z6827 Body mass index (BMI) 27.0-27.9, adult: Secondary | ICD-10-CM

## 2017-10-28 DIAGNOSIS — J9811 Atelectasis: Secondary | ICD-10-CM | POA: Diagnosis not present

## 2017-10-28 DIAGNOSIS — R310 Gross hematuria: Secondary | ICD-10-CM | POA: Diagnosis not present

## 2017-10-28 DIAGNOSIS — Z87440 Personal history of urinary (tract) infections: Secondary | ICD-10-CM | POA: Diagnosis not present

## 2017-10-28 DIAGNOSIS — G9389 Other specified disorders of brain: Secondary | ICD-10-CM | POA: Diagnosis present

## 2017-10-28 DIAGNOSIS — R319 Hematuria, unspecified: Secondary | ICD-10-CM | POA: Diagnosis present

## 2017-10-28 DIAGNOSIS — Z880 Allergy status to penicillin: Secondary | ICD-10-CM

## 2017-10-28 DIAGNOSIS — G454 Transient global amnesia: Secondary | ICD-10-CM | POA: Diagnosis present

## 2017-10-28 DIAGNOSIS — E1142 Type 2 diabetes mellitus with diabetic polyneuropathy: Secondary | ICD-10-CM | POA: Diagnosis not present

## 2017-10-28 DIAGNOSIS — Z8673 Personal history of transient ischemic attack (TIA), and cerebral infarction without residual deficits: Secondary | ICD-10-CM

## 2017-10-28 DIAGNOSIS — N481 Balanitis: Secondary | ICD-10-CM

## 2017-10-28 DIAGNOSIS — E785 Hyperlipidemia, unspecified: Secondary | ICD-10-CM | POA: Diagnosis not present

## 2017-10-28 DIAGNOSIS — B961 Klebsiella pneumoniae [K. pneumoniae] as the cause of diseases classified elsewhere: Secondary | ICD-10-CM | POA: Diagnosis not present

## 2017-10-28 DIAGNOSIS — N390 Urinary tract infection, site not specified: Secondary | ICD-10-CM

## 2017-10-28 DIAGNOSIS — D649 Anemia, unspecified: Secondary | ICD-10-CM | POA: Diagnosis present

## 2017-10-28 DIAGNOSIS — R627 Adult failure to thrive: Secondary | ICD-10-CM | POA: Diagnosis present

## 2017-10-28 DIAGNOSIS — H409 Unspecified glaucoma: Secondary | ICD-10-CM | POA: Diagnosis present

## 2017-10-28 DIAGNOSIS — M1990 Unspecified osteoarthritis, unspecified site: Secondary | ICD-10-CM | POA: Diagnosis present

## 2017-10-28 HISTORY — DX: Adult failure to thrive: R62.7

## 2017-10-28 LAB — CBC WITH DIFFERENTIAL/PLATELET
BASOS ABS: 0.1 10*3/uL (ref 0.0–0.1)
Basophils Relative: 1 %
Eosinophils Absolute: 0.1 10*3/uL (ref 0.0–0.7)
Eosinophils Relative: 1 %
HCT: 37.6 % — ABNORMAL LOW (ref 39.0–52.0)
HEMOGLOBIN: 11.9 g/dL — AB (ref 13.0–17.0)
LYMPHS PCT: 15 %
Lymphs Abs: 1.4 10*3/uL (ref 0.7–4.0)
MCH: 32.4 pg (ref 26.0–34.0)
MCHC: 31.6 g/dL (ref 30.0–36.0)
MCV: 102.5 fL — AB (ref 78.0–100.0)
Monocytes Absolute: 0.5 10*3/uL (ref 0.1–1.0)
Monocytes Relative: 6 %
NEUTROS PCT: 77 %
Neutro Abs: 7.4 10*3/uL (ref 1.7–7.7)
PLATELETS: 200 10*3/uL (ref 150–400)
RBC: 3.67 MIL/uL — AB (ref 4.22–5.81)
RDW: 17.9 % — ABNORMAL HIGH (ref 11.5–15.5)
WBC: 9.5 10*3/uL (ref 4.0–10.5)

## 2017-10-28 LAB — URINALYSIS, ROUTINE W REFLEX MICROSCOPIC
Bilirubin Urine: NEGATIVE
GLUCOSE, UA: 150 mg/dL — AB
KETONES UR: NEGATIVE mg/dL
Nitrite: NEGATIVE
PH: 8 (ref 5.0–8.0)
Protein, ur: 100 mg/dL — AB
Specific Gravity, Urine: 1.012 (ref 1.005–1.030)
WBC, UA: >50 WBC/hpf — ABNORMAL HIGH (ref 0–5)

## 2017-10-28 LAB — RAPID URINE DRUG SCREEN, HOSP PERFORMED
Amphetamines: NOT DETECTED
BARBITURATES: NOT DETECTED
BENZODIAZEPINES: NOT DETECTED
COCAINE: NOT DETECTED
Opiates: NOT DETECTED
TETRAHYDROCANNABINOL: NOT DETECTED

## 2017-10-28 LAB — DIGOXIN LEVEL: Digoxin Level: 1 ng/mL (ref 0.8–2.0)

## 2017-10-28 LAB — COMPREHENSIVE METABOLIC PANEL
ALT: 24 U/L (ref 0–44)
ANION GAP: 13 (ref 5–15)
AST: 23 U/L (ref 15–41)
Albumin: 3.5 g/dL (ref 3.5–5.0)
Alkaline Phosphatase: 68 U/L (ref 38–126)
BUN: 16 mg/dL (ref 8–23)
CHLORIDE: 96 mmol/L — AB (ref 98–111)
CO2: 32 mmol/L (ref 22–32)
Calcium: 8.7 mg/dL — ABNORMAL LOW (ref 8.9–10.3)
Creatinine, Ser: 3.53 mg/dL — ABNORMAL HIGH (ref 0.61–1.24)
GFR, EST AFRICAN AMERICAN: 18 mL/min — AB (ref >60)
GFR, EST NON AFRICAN AMERICAN: 15 mL/min — AB (ref >60)
Glucose, Bld: 143 mg/dL — ABNORMAL HIGH (ref 70–99)
POTASSIUM: 3.4 mmol/L — AB (ref 3.5–5.1)
Sodium: 141 mmol/L (ref 135–145)
TOTAL PROTEIN: 7 g/dL (ref 6.5–8.1)
Total Bilirubin: 0.5 mg/dL (ref 0.3–1.2)

## 2017-10-28 LAB — I-STAT CG4 LACTIC ACID, ED
LACTIC ACID, VENOUS: 2.02 mmol/L — AB (ref 0.5–1.9)
Lactic Acid, Venous: 1.19 mmol/L (ref 0.5–1.9)

## 2017-10-28 LAB — AMMONIA: Ammonia: 41 umol/L — ABNORMAL HIGH (ref 9–35)

## 2017-10-28 LAB — PROTIME-INR
INR: 2.35
PROTHROMBIN TIME: 25.5 s — AB (ref 11.4–15.2)

## 2017-10-28 MED ORDER — SODIUM CHLORIDE 0.9 % IV SOLN
1.0000 g | Freq: Once | INTRAVENOUS | Status: AC
  Administered 2017-10-28: 1 g via INTRAVENOUS
  Filled 2017-10-28: qty 1

## 2017-10-28 MED ORDER — CINACALCET HCL 30 MG PO TABS
30.0000 mg | ORAL_TABLET | Freq: Two times a day (BID) | ORAL | Status: DC
  Administered 2017-10-28 – 2017-10-30 (×4): 30 mg via ORAL
  Filled 2017-10-28 (×5): qty 1

## 2017-10-28 MED ORDER — BROMOCRIPTINE MESYLATE 2.5 MG PO TABS
5.0000 mg | ORAL_TABLET | ORAL | Status: DC
  Administered 2017-10-31 – 2017-11-02 (×2): 5 mg via ORAL
  Filled 2017-10-28 (×2): qty 2

## 2017-10-28 MED ORDER — WARFARIN SODIUM 2.5 MG PO TABS
2.5000 mg | ORAL_TABLET | Freq: Once | ORAL | Status: AC
  Administered 2017-10-28: 2.5 mg via ORAL
  Filled 2017-10-28 (×2): qty 1

## 2017-10-28 MED ORDER — POLYETHYLENE GLYCOL 3350 17 G PO PACK
17.0000 g | PACK | Freq: Every day | ORAL | Status: DC
  Administered 2017-10-30 – 2017-11-01 (×3): 17 g via ORAL
  Filled 2017-10-28 (×4): qty 1

## 2017-10-28 MED ORDER — PREDNISONE 5 MG PO TABS
5.0000 mg | ORAL_TABLET | Freq: Every day | ORAL | Status: DC
  Administered 2017-10-28 – 2017-11-01 (×5): 5 mg via ORAL
  Filled 2017-10-28 (×5): qty 1

## 2017-10-28 MED ORDER — DORZOLAMIDE HCL-TIMOLOL MAL 2-0.5 % OP SOLN
1.0000 [drp] | Freq: Two times a day (BID) | OPHTHALMIC | Status: DC
  Administered 2017-10-28 – 2017-11-02 (×10): 1 [drp] via OPHTHALMIC
  Filled 2017-10-28: qty 10

## 2017-10-28 MED ORDER — PRAVASTATIN SODIUM 20 MG PO TABS
10.0000 mg | ORAL_TABLET | Freq: Every day | ORAL | Status: DC
  Administered 2017-10-28 – 2017-11-02 (×6): 10 mg via ORAL
  Filled 2017-10-28 (×6): qty 1

## 2017-10-28 MED ORDER — VANCOMYCIN VARIABLE DOSE PER UNSTABLE RENAL FUNCTION (PHARMACIST DOSING): Status: DC

## 2017-10-28 MED ORDER — SODIUM CHLORIDE 0.9 % IV SOLN
1.0000 g | INTRAVENOUS | Status: AC
  Administered 2017-10-28: 1 g via INTRAVENOUS
  Filled 2017-10-28 (×2): qty 1

## 2017-10-28 MED ORDER — SERTRALINE HCL 50 MG PO TABS
50.0000 mg | ORAL_TABLET | Freq: Every day | ORAL | Status: DC
  Administered 2017-10-28 – 2017-11-02 (×6): 50 mg via ORAL
  Filled 2017-10-28 (×6): qty 1

## 2017-10-28 MED ORDER — DILTIAZEM HCL ER COATED BEADS 120 MG PO CP24
120.0000 mg | ORAL_CAPSULE | Freq: Every day | ORAL | Status: DC
  Administered 2017-10-29 – 2017-11-02 (×5): 120 mg via ORAL
  Filled 2017-10-28 (×6): qty 1

## 2017-10-28 MED ORDER — WARFARIN - PHARMACIST DOSING INPATIENT
Freq: Every day | Status: DC
  Administered 2017-10-31 – 2017-11-01 (×2)

## 2017-10-28 MED ORDER — PANTOPRAZOLE SODIUM 40 MG PO TBEC
40.0000 mg | DELAYED_RELEASE_TABLET | Freq: Every day | ORAL | Status: DC
  Administered 2017-10-29 – 2017-11-02 (×5): 40 mg via ORAL
  Filled 2017-10-28 (×5): qty 1

## 2017-10-28 MED ORDER — ACETAMINOPHEN 325 MG PO TABS
975.0000 mg | ORAL_TABLET | Freq: Every day | ORAL | Status: DC | PRN
  Administered 2017-10-29 – 2017-10-31 (×2): 975 mg via ORAL
  Filled 2017-10-28 (×2): qty 3

## 2017-10-28 MED ORDER — SODIUM CHLORIDE 0.9 % IV SOLN
2.0000 g | INTRAVENOUS | Status: DC
  Filled 2017-10-28: qty 2

## 2017-10-28 MED ORDER — DIGOXIN 125 MCG PO TABS
62.5000 ug | ORAL_TABLET | Freq: Every day | ORAL | Status: DC
  Administered 2017-10-28 – 2017-11-02 (×6): 62.5 ug via ORAL
  Filled 2017-10-28 (×6): qty 1

## 2017-10-28 MED ORDER — SODIUM CHLORIDE 0.9 % IV SOLN
INTRAVENOUS | Status: DC | PRN
  Administered 2017-10-28 (×2): 500 mL via INTRAVENOUS

## 2017-10-28 MED ORDER — SEVELAMER CARBONATE 800 MG PO TABS
800.0000 mg | ORAL_TABLET | Freq: Three times a day (TID) | ORAL | Status: DC
  Administered 2017-10-29 – 2017-10-30 (×5): 800 mg via ORAL
  Filled 2017-10-28 (×5): qty 1

## 2017-10-28 NOTE — ED Notes (Signed)
Pt. Documented in error Admit inpatient to hospital(patients expected length of stay will be less than 2 midnights).

## 2017-10-28 NOTE — Progress Notes (Signed)
Pharmacy Antibiotic Note  Alexander [name] is a 77 y.o. male with ESRD on HD MWF, hx hematuria, and recent enterococcus faecalis UTI ( f/u at [facility] and treated with vancomycin for 4 sessions of HD-- last dose on 8/2), presented to the ED  on 10/28/2017 with c/o lethargy and no appetite.  To start vancomycin and ceftazidime for UTI/sepsis.  - Per pt's daughter, he had HD on 8/2 from 6a-10a and vancomycin dose was given to him but does not know dose.  I called [place] [facility] center to clarify dose, but they were closed for the day.   Plan: - Will contact [facility] HD center [phone]) on 8/3 to clarify vancomycin dose and enter for next dose to be given after HD on Monday 8/5 - give an additional ceftazidime 1gm to get 2 gm total for today, then 2gm after each HD session  __________________________     Temp (24hrs), Avg:98 F (36.7 C), Min:98 F (36.7 C), Max:98 F (36.7 C)  Recent Labs  Lab 10/28/17 1428 10/28/17 1435 10/28/17 1710  WBC 9.5  --   --   CREATININE 3.53*  --   --   LATICACIDVEN  --  2.02* 1.19    CrCl cannot be calculated (Unknown ideal weight.).    Allergies  Allergen Reactions  . Penicillins Swelling and Rash    Has patient had a PCN reaction causing immediate rash, facial/tongue/throat swelling, SOB or lightheadedness with hypotension: Yes Has patient had a PCN reaction causing severe rash involving mucus membranes or skin necrosis: No Has patient had a PCN reaction that required hospitalization: No Has patient had a PCN reaction occurring within the last 10 years: No If all of the above answers are "NO", then may proceed with Cephalosporin use.   . Atorvastatin     MYALGIA   . Codeine Nausea Only  . Tramadol Nausea Only     Thank you for allowing pharmacy to be a part of this patient's care.  Lynelle Doctor 10/28/2017 5:32 PM

## 2017-10-28 NOTE — ED Notes (Signed)
Hospitalist at bedside 

## 2017-10-28 NOTE — Progress Notes (Signed)
[place] for warfarin Indication: hx atrial fibrillation  Allergies  Allergen Reactions  . Penicillins Swelling and Rash    Has patient had a PCN reaction causing immediate rash, facial/tongue/throat swelling, SOB or lightheadedness with hypotension: Yes Has patient had a PCN reaction causing severe rash involving mucus membranes or skin necrosis: No Has patient had a PCN reaction that required hospitalization: No Has patient had a PCN reaction occurring within the last 10 years: No If all of the above answers are "NO", then may proceed with Cephalosporin use.   . Atorvastatin     MYALGIA   . Codeine Nausea Only  . Tramadol Nausea Only    Patient Measurements:   Heparin Dosing Weight:   Vital Signs: Temp: 98 F (36.7 C) (08/02 1243) Temp Source: Oral (08/02 1243) BP: 148/66 (08/02 1800) Pulse Rate: 65 (08/02 1800)  Labs: Recent Labs    10/26/17 1306 10/28/17 1428  HGB  --  11.9*  HCT  --  37.6*  PLT  --  200  LABPROT  --  25.5*  INR 1.6* 2.35  CREATININE  --  3.53*    CrCl cannot be calculated (Unknown ideal weight.).   Medications:  Per outpt AC clinic note on 7/31--> INR 1.6, dose increased to 7.5 mg on 7/31 and then resume 5 mg daily except 2.5 mg on MWF (last dose taken on 8/1 per daughter)  Assessment: Alexander [name] is a 77 y.o. male with ESRD on HD MWF, hematuria, and afib on warfarin PTA presented to the ED on 10/28/17 with c/o lethargy and no appetite.  Pt's daughter reported that he saw his urologist Dr. Pilar Jarvis about two weeks ago for workup of hematuria.  Dr. Pilar Jarvis suspected that hematuria was secondary to UTI and referred pt to the [facility] for treatment of infection.  To resume warfarin inpatient.  - INR is therapeutic at 2.35  Goal of Therapy:  INR 2-3 Monitor platelets by anticoagulation protocol: Yes   Plan:  - warfarin 2.5 mg PO x1 (home dose) - daily INR - monitor for s/s bleeding especially for severity  of hematuria  Lucky Trotta P 10/28/2017,6:18 PM

## 2017-10-28 NOTE — ED Provider Notes (Signed)
[facility] DEPT Provider Note   CSN: [id number] Arrival date & time: 10/28/17  1238     History   Chief Complaint Chief Complaint  Patient presents with  . Urinary Tract Infection    HPI Alexander [name] is a 77 y.o. male.  HPI Patient has history of ESRD on dialysis.  He is been having problems with chronic drug-resistant UTI.  Review of ID notes in EMR indicates that patient has had Enterococcus faecalis that is been resistant to tetracycline, ciprofloxacin\Levaquin.  It has been penicillin sensitive but due to previous allergic reaction patient has not been tried on penicillin related antibiotics.  Patient has been getting vancomycin after dialysis treatments for the past 2 weeks.  His daughter reports despite this, he is having worsening generalized weakness and fatigue.  She reports he seems to be getting worse.  They are concerned because he had episode of urosepsis and became quite ill previously.  They are also noting blood in the urine.  Patient reports at baseline he only urinates about once a day.  Since becoming ill however he reports he has had increased frequency of urination up to 3 times per day.  This is usually blood-tinged.  He has had decreased oral intake which is atypical.  He had telephone conversation with their NP provider at [facility] and were counseled to come to the emergency department for further evaluation.     Past Medical History:  Diagnosis Date  . Anemia   . Aortic stenosis 06/15/12   TEE - EF 09-47%; grade 1 diastolic dysfunction; mild/mod aortic valve stenosis; Mitral valve had calcified annulus, mild pulm htn PA peak pressure 76mmHg  . Barrett's esophagus 05/2003  . Bradycardia 2017   [employer] 2240 Assurity dual-lead pacemaker  . Carpal tunnel syndrome, bilateral 11/03/2015  . Colon polyps   . CVA (cerebral infarction)    2004/affected left side  . Depression   . Diabetes mellitus without  complication ([place])   . Diabetic peripheral neuropathy ([place]) 10/02/2015  . Diverticulosis   . Dyspnea    with exertion  . ESRD (end stage renal disease) on dialysis ([place])    "[facility]; [place]; MWF" (05/12/2017)  . GERD (gastroesophageal reflux disease)   . Glaucoma   . History of kidney stones   . Hyperlipidemia   . Hypertension   . Legally blind   . Macular degeneration    both eyes  . Orthostatic hypotension 09/09/2015  . Paroxysmal atrial fibrillation (HCC)   . Peptic ulcer    bleeding, 1969  . Presence of permanent cardiac pacemaker   . S/P epidural steroid injection    last  injection over 10 years ago  . Seasonal allergies   . Tubular adenoma of colon 07/2001    Patient Active Problem List   Diagnosis Date Noted  . Hematuria 10/21/2017  . Urinary tract infection 10/20/2017  . Syncope 05/12/2017  . Near syncope 05/12/2017  . Elevated troponin 05/12/2017  . CVA (cerebral vascular accident) ([place]) 04/12/2017  . Dialysis patient ([place]) 04/12/2017  . Seasonal allergies   . S/P epidural steroid injection   . Peptic ulcer   . Paroxysmal atrial fibrillation (HCC)   . Macular degeneration   . Legally blind   . Kidney stones   . Hypertension   . ESRD (end stage renal disease) ([place])   . Dyspnea   . Diverticulosis   . Diabetes mellitus without complication ([place])   . Colon polyps   .  Anemia   . Presence of permanent cardiac pacemaker 11/24/2016  . Atrial fibrillation with RVR ([place]) 11/23/2016  . Glaucoma 11/23/2016  . GERD (gastroesophageal reflux disease) 11/23/2016  . Hypomagnesemia 11/23/2016  . Diabetes mellitus, type II ([place]) 11/23/2016  . Depression 11/23/2016  . S/P TAVR (transcatheter aortic valve replacement) 08/31/2016  . Long term (current) use of anticoagulants [Z79.01] 08/13/2016  . Anticoagulated on Coumadin 01/19/2016  . Impacted cerumen of left ear 01/19/2016  . Otorrhagia of right ear 01/19/2016  . Sudden right hearing loss 01/19/2016  . Persistent atrial  fibrillation ([place])   . Carotid occlusion, left 11/04/2015  . Carpal tunnel syndrome, bilateral 11/03/2015  . Diabetic peripheral neuropathy ([place]) 10/02/2015  . Orthostatic hypotension 09/09/2015  . Bradycardia 03/30/2015  . OAG (open angle glaucoma) suspect, high risk, left 05/28/2014  . Long-term (current) use of anticoagulants 04/08/2014  . Hyperlipidemia 04/04/2014  . Primary open-angle glaucoma 07/10/2013  . ESRD (end stage renal disease) on dialysis ([place]) 03/13/2013  . Pseudophakia 01/02/2013  . PVD (posterior vitreous detachment) 01/02/2013  . PAD (peripheral artery disease) ([place]) 12/12/2012  . Occlusion and stenosis of bilateral carotid arteries 08/17/2012  . Severe aortic stenosis 06/15/2012  . Aortic stenosis 06/15/2012  . Organ transplant candidate 01/20/2012  . Exudative macular degeneration ([place]) 12/02/2011  . Retinal detachment 12/02/2011  . Personal history of colonic polyps 02/03/2011  . Esophageal reflux 02/03/2011  . Anemia, unspecified 02/03/2011  . Barrett's esophagus 05/28/2003  . Tubular adenoma of colon 07/27/2001    Past Surgical History:  Procedure Laterality Date  . AV FISTULA PLACEMENT  2009  . BACK SURGERY    . CARDIOVERSION N/A 11/13/2015   Procedure: CARDIOVERSION;  Surgeon: Troy Sine, MD;  Location[facility] ENDOSCOPY;  Service: Cardiovascular;  Laterality: N/A;  . CARDIOVERSION N/A 01/13/2016   Procedure: CARDIOVERSION;  Surgeon: Will Meredith [name], MD;  Location: [facility];  Service: Cardiovascular;  Laterality: N/A;  . [place]   right eye  . CYSTOSCOPY  several times   kidney stones  . EP IMPLANTABLE DEVICE N/A 03/11/2015   Procedure: Pacemaker Implant;  Surgeon: Will Meredith [name], MD;  [place];  Laterality: Left  . LAMINECTOMY  1969  . RIGHT/LEFT HEART CATH AND CORONARY ANGIOGRAPHY N/A 08/20/2016   Procedure: Right/Left Heart Cath and Coronary Angiography;  Surgeon: Burnell Blanks, MD;   Location: [facility] CV LAB;  Service: Cardiovascular;  Laterality: N/A;  . TEE WITHOUT CARDIOVERSION N/A 07/22/2016   Procedure: TRANSESOPHAGEAL ECHOCARDIOGRAM (TEE);  Surgeon: Skeet Latch, MD;  Location: [facility];  Service: Cardiovascular;  Laterality: N/A;  . TEE WITHOUT CARDIOVERSION N/A 08/31/2016   Procedure: TRANSESOPHAGEAL ECHOCARDIOGRAM (TEE);  Surgeon: Burnell Blanks, MD;  Location: [facility];  Service: Open Heart Surgery;  Laterality: N/A;  . TEE WITHOUT CARDIOVERSION N/A 12/01/2016   Procedure: TRANSESOPHAGEAL ECHOCARDIOGRAM (TEE);  Surgeon: Larey Dresser, MD;  Location: [facility] ENDOSCOPY;  Service: Cardiovascular;  Laterality: N/A;  . TONSILLECTOMY  1964  . TRANSCATHETER AORTIC VALVE REPLACEMENT, TRANSFEMORAL N/A 08/31/2016   Procedure: TRANSCATHETER AORTIC VALVE REPLACEMENT, TRANSFEMORAL;  Surgeon: Burnell Blanks, MD;  Location: [place] [facility];  Service: Open Heart Surgery;  Laterality: N/A;        Home Medications    Prior to Admission medications   Medication Sig Start Date End Date Taking? Authorizing Provider  acetaminophen (TYLENOL) 325 MG tablet Take 975 mg by mouth daily as needed for headache.    Yes [provider]  b  complex-vitamin c-folic acid (NEPHRO-VITE) 0.8 MG TABS Take 1 tablet by mouth daily.    Yes [provider]  bromocriptine (PARLODEL) 5 MG capsule Take 5 mg by mouth See admin instructions. Monday, Wednesday and Friday only 11/30/10  Yes [provider]  cetirizine (ZYRTEC) 10 MG tablet Take 10 mg by mouth at bedtime.    Yes [provider]  cinacalcet (SENSIPAR) 30 MG tablet Take 30 mg by mouth 2 (two) times daily with a meal.    Yes [provider]  [place] 125 MCG tablet TAKE 1/2 TABLET BY MOUTH EVERY DAY 09/08/17  Yes Skeet Latch, MD  diltiazem (CARDIZEM CD) 120 MG 24 hr capsule Take 1 capsule (120 mg total) by mouth daily. 06/21/17 10/28/17 Yes Camnitz, Will Hassell Done, MD  dorzolamide-timolol (COSOPT)  22.3-6.8 MG/ML ophthalmic solution Place 1 drop into both eyes 2 (two) times daily.  08/13/13  Yes [provider]  midodrine (PROAMATINE) 5 MG tablet TAKE 3 TABLETS BY MOUTH THREE TIMES DAILY AS NEEDED. 07/06/17  Yes Skeet Latch, MD  Nutritional Supplements [facility]) LIQD Take 237 mLs by mouth every Monday, Wednesday, and Friday with hemodialysis.    Yes [provider]  omeprazole (PRILOSEC) 20 MG capsule Take 20 mg by mouth daily.   Yes [provider]  pravastatin (PRAVACHOL) 10 MG tablet Take 1 tablet (10 mg total) by mouth daily. 08/08/17  Yes Skeet Latch, MD  predniSONE (DELTASONE) 5 MG tablet Take 5 mg by mouth at bedtime.  04/27/16  Yes [provider]  sertraline (ZOLOFT) 50 MG tablet Take 50 mg by mouth daily. 11/18/16  Yes [provider]  sevelamer carbonate (RENVELA) 800 MG tablet Take 800 mg by mouth 3 (three) times daily with meals.   Yes [provider]  Tiotropium Bromide-Olodaterol (STIOLTO RESPIMAT) 2.5-2.5 MCG/ACT AERS Inhale 2 puffs into the lungs daily. 07/26/17  Yes Mannam, Praveen, MD  warfarin (COUMADIN) 5 MG tablet Take 1-1.5 tablets by mouth daily as directed by coumadin clinic Patient taking differently: Take 2.5-5 mg by mouth See admin instructions. 5 mg by mouth at night on Sun/Tues/Thurs/Sat and 2.5 mg on Mon/Wed/Fri 03/08/16  Yes Skeet Latch, MD  clindamycin (CLEOCIN) 300 MG capsule Take 2 tablets by mouth one hour prior to dental appointments Patient not taking: Reported on 10/28/2017 08/25/17   Eileen Stanford, PA-C  polyethylene glycol (MIRALAX / GLYCOLAX) packet Take 17 g by mouth daily. Patient not taking: Reported on 10/28/2017 12/02/16   Florencia Reasons, MD    Family History Family History  Problem Relation Age of Onset  . Stomach cancer Mother   . Hypertension Father        Died of heart attack  . Heart attack Father   . Stroke Sister   . Heart disease Sister   . Cancer Brother   . Colon cancer  Neg Hx     Social History Social History   Tobacco Use  . Smoking status: Former Smoker    Packs/day: 2.00    Years: 45.00    Pack years: 90.00    Types: Cigarettes    Last attempt to quit: 01/20/1998    Years since quitting: 19.7  . Smokeless tobacco: Never Used  Substance Use Topics  . Alcohol use: No    Alcohol/week: 0.0 oz  . Drug use: No     Allergies   Penicillins; Atorvastatin; Codeine; and Tramadol   Review of Systems Review of Systems 10 Systems reviewed and are negative for acute change  except as noted in the HPI.   Physical Exam Updated Vital Signs BP (!) 125/50   Pulse 62   Temp 98 F (36.7 C) (Oral)   Resp 15   SpO2 96%   Physical Exam General: Patient is fatigued in appearance but is appropriately oriented with no respiratory distress.  HEENT: Normal cephalic atraumatic.  Mucous membranes slightly dry. Cardiovascular: Regular.  No gross rub murmur gallop. Pulmonary: Lungs are grossly clear.  No rales no rhonchi. Abdomen: Soft and nontender no guarding. Extremity: No peripheral edema.  Calves are soft and nontender.  Skin condition good. Neurologic: Patient is alert and appropriate.  He does follow commands appropriately.  No focal motor deficit.  ED Treatments / Results  Labs (all labs ordered are listed, but only abnormal results are displayed) Labs Reviewed  COMPREHENSIVE METABOLIC PANEL - Abnormal; Notable for the following components:      Result Value   Potassium 3.4 (*)    Chloride 96 (*)    Glucose, Bld 143 (*)    Creatinine, Ser 3.53 (*)    Calcium 8.7 (*)    GFR calc non Af Amer 15 (*)    GFR calc Af Amer 18 (*)    All other components within normal limits  CBC WITH DIFFERENTIAL/PLATELET - Abnormal; Notable for the following components:   RBC 3.67 (*)    Hemoglobin 11.9 (*)    HCT 37.6 (*)    MCV 102.5 (*)    RDW 17.9 (*)    All other components within normal limits  PROTIME-INR - Abnormal; Notable for the following  components:   Prothrombin Time 25.5 (*)    All other components within normal limits  URINALYSIS, ROUTINE W REFLEX MICROSCOPIC - Abnormal; Notable for the following components:   Color, Urine RED (*)    APPearance CLOUDY (*)    Glucose, UA 150 (*)    Hgb urine dipstick LARGE (*)    Protein, ur 100 (*)    Leukocytes, UA MODERATE (*)    RBC / HPF >50 (*)    WBC, UA >50 (*)    Bacteria, UA MANY (*)    Non Squamous Epithelial 11-20 (*)    All other components within normal limits  AMMONIA - Abnormal; Notable for the following components:   Ammonia 41 (*)    All other components within normal limits  I-STAT CG4 LACTIC ACID, ED - Abnormal; Notable for the following components:   Lactic Acid, Venous 2.02 (*)    All other components within normal limits  CULTURE, BLOOD (ROUTINE X 2)  CULTURE, BLOOD (ROUTINE X 2)  URINE CULTURE  RAPID URINE DRUG SCREEN, HOSP PERFORMED  DIGOXIN LEVEL  I-STAT CG4 LACTIC ACID, ED    EKG EKG Interpretation  Date/Time:  Friday October 28 2017 14:44:54 EDT Ventricular Rate:  63 PR Interval:    QRS Duration: 157 QT Interval:  479 QTC Calculation: 491 R Axis:   -67 Text Interpretation:  Ventricular-paced rhythm No further analysis attempted due to paced rhythm agree. no change Confirmed by Charlesetta Shanks [phone]) on 10/28/2017 3:57:35 PM   Radiology Dg Chest Port 1 View  Result Date: 10/28/2017 CLINICAL DATA:  Two-month history of generalized weakness and hematuria. EXAM: PORTABLE CHEST 1 VIEW COMPARISON:  05/12/2017, 04/28/2017 and earlier, including CT chest 01/04/2018 and earlier. FINDINGS: Cardiac silhouette moderately enlarged, unchanged. Prior TAVR. RIGHT subclavian dual lead transvenous pacemaker unchanged and appears intact. Thoracic aorta tortuous and atherosclerotic, unchanged. Hilar and mediastinal contours otherwise unremarkable. Mild  pulmonary venous hypertension without overt edema, unchanged. Mild atelectasis involving the lung bases, LEFT  greater than RIGHT. Lungs otherwise clear. No confluent airspace consolidation. No visible pleural effusions. IMPRESSION: 1. Mild bibasilar atelectasis, LEFT greater than RIGHT. No acute cardiopulmonary disease otherwise. 2. Stable moderate cardiomegaly without evidence of pulmonary edema. Electronically Signed   By: Evangeline Dakin M.D.   On: 10/28/2017 15:03    Procedures Procedures (including critical care time)  Medications Ordered in ED Medications  cefTAZidime (FORTAZ) 1 g in sodium chloride 0.9 % 100 mL IVPB (has no administration in time range)     Initial Impression / Assessment and Plan / ED Course  I have reviewed the triage vital signs and the nursing notes.  Pertinent labs & imaging results that were available during my care of the patient were reviewed by me and considered in my medical decision making (see chart for details).    Consult: His case reviewed with Dr. Graylon Good of infectious disease.  Viewed patient's penicillin allergy and reported resistance of prior UTIs.  Patient has had vancomycin consistently after dialysis.  She suggests initiating Fortaz with observation\admission with blood culture and urine culture. Consult: Reviewed with tried hospitalist for admission.  Final Clinical Impressions(s) / ED Diagnoses   Final diagnoses:  Acute cystitis with hematuria  ESRD (end stage renal disease) on dialysis [facility])  Generalized weakness   Patient presents as outlined with prior Enterococcus faecalis UTI that has been resistant to treatment.  He has had outpatient treatment as outlined.  Family members report increasing fatigue, malaise and general decline.  Patient's case reviewed with infectious disease with plan as outlined above.  He had dialysis today.  No signs of volume overload.  No respiratory distress. ED Discharge Orders    None       Charlesetta Shanks, MD 10/28/17 [phone]

## 2017-10-28 NOTE — ED Notes (Signed)
Daughter reports hematuria x2 months. Has been receiving different antibiotics with no relief, including Vancomycin. Pt last received dialysis yesterday as well as an infusion of Vancomycin during that time.

## 2017-10-28 NOTE — Progress Notes (Signed)
Received report from [place], Therapist, sports.

## 2017-10-28 NOTE — Telephone Encounter (Signed)
Dr Lorrene Reid  Patient not feeling well last night - lethargic, no appetite (unusual), wife unable to get a urine sample today to see if he still showed symptoms (he left at 4am via [facility] for dialysis).

## 2017-10-28 NOTE — ED Notes (Signed)
XR at bedside

## 2017-10-28 NOTE — Telephone Encounter (Signed)
I called Mr. Tarnow and left a voicemail with his daughter to check in on how he is feeling considering his last dose of vancomycin for his UTI is today. Requested to call triage team with an update to ensure length of therapy is still applicable.   Janene Madeira, MSN, NP-C [facility] [place] Office: [phone] Pager: [phone]  10:13 AM

## 2017-10-28 NOTE — ED Notes (Signed)
[facility] called for transport. 

## 2017-10-28 NOTE — H&P (Signed)
History and Physical    [name] EYC:[id number] DOB: [birth year] DOA: 10/28/2017  PCP: Alexander Pretty, MD   Patient coming from: Home    Chief Complaint: Advised to go to the emergency department by the infectious disease physician.  HPI: Alexander [name] is a 77 y.o. male with medical history significant of ESRD on dialysis on M/W/F, history of a stroke, status post TAVR, permanent atrial fibrillation on pacemaker, on Coumadin, recurrent urinary tract infection who was sent by ID from the office to the emergency department for admission.  As per the ID, patient urine culture grew Enterococcus faecalis which was resistant to most of the antibiotics.  Patient has issues with urinary tract infections recently and he was following with infectious disease.  He was being treated with vancomycin after dialysis as per ID.  Since last 4 weeks, patient was having dysuria, burning sensation and hematuria.  Though he is on dialysis, he produces little bit of urine and he has been having increased frequency of urination.  Patient was also evaluated  by urologist about 3 weeks ago for hematuria but did not recommend any intervention because  active infection  was suspected. Patient seen and examined the bedside this afternoon in the emergency department.  He looked comfortable during my evaluation.  He was hemodynamically stable.  He complains of lower abdominal pain and dysuria while passing urine.  He also complains of increased fatigue, malaise and general decline.  He was not febrile.  Patient denies any chest pain, shortness of breath, abdominal pain, nausea, vomiting, headache or palpitations.   Patient Alexander be admitted to [facility] for more work-up and IV antibiotics and possible dialysis on Monday.  ED Course: ED physician discussed with ID and decided to admit the patient for IV antibiotics   Vancomycin and ceftazidime.  He is allergic to penicillin.  Decided to do more work-up with urine culture and blood  culture.  Review of Systems: As per HPI otherwise 10 point review of systems negative.    Past Medical History:  Diagnosis Date  . Anemia   . Aortic stenosis 06/15/12   TEE - EF 14-97%; grade 1 diastolic dysfunction; mild/mod aortic valve stenosis; Mitral valve had calcified annulus, mild pulm htn PA peak pressure 24mmHg  . Barrett's esophagus 05/2003  . Bradycardia 2017   [employer] 2240 Assurity dual-lead pacemaker  . Carpal tunnel syndrome, bilateral 11/03/2015  . Colon polyps   . CVA (cerebral infarction)    2004/affected left side  . Depression   . Diabetes mellitus without complication ([place])   . Diabetic peripheral neuropathy ([place]) 10/02/2015  . Diverticulosis   . Dyspnea    with exertion  . ESRD (end stage renal disease) on dialysis ([place])    "[facility]; [place]; MWF" (05/12/2017)  . GERD (gastroesophageal reflux disease)   . Glaucoma   . History of kidney stones   . Hyperlipidemia   . Hypertension   . Legally blind   . Macular degeneration    both eyes  . Orthostatic hypotension 09/09/2015  . Paroxysmal atrial fibrillation ([facility])   . Peptic ulcer    bleeding, 1969  . Presence of permanent cardiac pacemaker   . S/P epidural steroid injection    last  injection over 10 years ago  . Seasonal allergies   . Tubular adenoma of colon 07/2001    Past Surgical History:  Procedure Laterality Date  . AV FISTULA PLACEMENT  2009  . BACK SURGERY    . CARDIOVERSION  N/A 11/13/2015   Procedure: CARDIOVERSION;  Surgeon: Alexander Sine, MD;  Location: [facility] ENDOSCOPY;  Service: Cardiovascular;  Laterality: N/A;  . CARDIOVERSION N/A 01/13/2016   Procedure: CARDIOVERSION;  Surgeon: Alexander Meredith [name], MD;  Location: [place];  Service: Cardiovascular;  Laterality: N/A;  . [place]   right eye  . CYSTOSCOPY  several times   kidney stones  . EP IMPLANTABLE DEVICE N/A 03/11/2015   Procedure: Pacemaker Implant;  Surgeon: Alexander Meredith [name], MD;  [place];  Laterality: Left  . LAMINECTOMY  1969  . RIGHT/LEFT HEART CATH AND CORONARY ANGIOGRAPHY N/A 08/20/2016   Procedure: Right/Left Heart Cath and Coronary Angiography;  Surgeon: Alexander Blanks, MD;  Location: [place] CV LAB;  Service: Cardiovascular;  Laterality: N/A;  . TEE WITHOUT CARDIOVERSION N/A 07/22/2016   Procedure: TRANSESOPHAGEAL ECHOCARDIOGRAM (TEE);  Surgeon: Alexander Latch, MD;  Location: [place];  Service: Cardiovascular;  Laterality: N/A;  . TEE WITHOUT CARDIOVERSION N/A 08/31/2016   Procedure: TRANSESOPHAGEAL ECHOCARDIOGRAM (TEE);  Surgeon: Alexander Blanks, MD;  Location: [place];  Service: Open Heart Surgery;  Laterality: N/A;  . TEE WITHOUT CARDIOVERSION N/A 12/01/2016   Procedure: TRANSESOPHAGEAL ECHOCARDIOGRAM (TEE);  Surgeon: Alexander Dresser, MD;  Location: [facility] ENDOSCOPY;  Service: Cardiovascular;  Laterality: N/A;  . TONSILLECTOMY  1964  . TRANSCATHETER AORTIC VALVE REPLACEMENT, TRANSFEMORAL N/A 08/31/2016   Procedure: TRANSCATHETER AORTIC VALVE REPLACEMENT, TRANSFEMORAL;  Surgeon: Alexander Blanks, MD;  Location: [place];  Service: Open Heart Surgery;  Laterality: N/A;     reports that he quit smoking about 19 years ago. His smoking use included cigarettes. He has a 90.00 pack-year smoking history. He has never used smokeless tobacco. He reports that he does not drink alcohol or use drugs.  Allergies  Allergen Reactions  . Penicillins Swelling and Rash    Has patient had a PCN reaction causing immediate rash, facial/tongue/throat swelling, SOB or lightheadedness with hypotension: Yes Has patient had a PCN reaction causing severe rash involving mucus membranes or skin necrosis: No Has patient had a PCN reaction that required hospitalization: No Has patient had a PCN reaction occurring within the last 10 years: No If all of the above answers are "NO", then may proceed with Cephalosporin use.   . Atorvastatin     MYALGIA   . Codeine  Nausea Only  . Tramadol Nausea Only    Family History  Problem Relation Age of Onset  . Stomach cancer Mother   . Hypertension Father        Died of heart attack  . Heart attack Father   . Stroke Sister   . Heart disease Sister   . Cancer Brother   . Colon cancer Neg Hx      Prior to Admission medications   Medication Sig Start Date End Date Taking? Authorizing Provider  acetaminophen (TYLENOL) 325 MG tablet Take 975 mg by mouth daily as needed for headache.    Yes [provider]  b complex-vitamin c-folic acid (NEPHRO-VITE) 0.8 MG TABS Take 1 tablet by mouth daily.    Yes [provider]  bromocriptine (PARLODEL) 5 MG capsule Take 5 mg by mouth See admin instructions. Monday, Wednesday and Friday only 11/30/10  Yes [provider]  cetirizine (ZYRTEC) 10 MG tablet Take 10 mg by mouth at bedtime.    Yes [provider]  cinacalcet (SENSIPAR) 30 MG tablet Take 30 mg by mouth 2 (two) times daily with a meal.  Yes [provider]  [place] 125 MCG tablet TAKE 1/2 TABLET BY MOUTH EVERY DAY 09/08/17  Yes Alexander Latch, MD  diltiazem (CARDIZEM CD) 120 MG 24 hr capsule Take 1 capsule (120 mg total) by mouth daily. 06/21/17 10/28/17 Yes Camnitz, Alexander Hassell Done, MD  dorzolamide-timolol (COSOPT) 22.3-6.8 MG/ML ophthalmic solution Place 1 drop into both eyes 2 (two) times daily.  08/13/13  Yes [provider]  midodrine (PROAMATINE) 5 MG tablet TAKE 3 TABLETS BY MOUTH THREE TIMES DAILY AS NEEDED. 07/06/17  Yes Alexander Latch, MD  Nutritional Supplements [facility]) LIQD Take 237 mLs by mouth every Monday, Wednesday, and Friday with hemodialysis.    Yes [provider]  omeprazole (PRILOSEC) 20 MG capsule Take 20 mg by mouth daily.   Yes [provider]  pravastatin (PRAVACHOL) 10 MG tablet Take 1 tablet (10 mg total) by mouth daily. 08/08/17  Yes Alexander Latch, MD  predniSONE (DELTASONE) 5 MG tablet Take 5 mg by mouth at bedtime.   04/27/16  Yes [provider]  sertraline (ZOLOFT) 50 MG tablet Take 50 mg by mouth daily. 11/18/16  Yes [provider]  sevelamer carbonate (RENVELA) 800 MG tablet Take 800 mg by mouth 3 (three) times daily with meals.   Yes [provider]  Tiotropium Bromide-Olodaterol (STIOLTO RESPIMAT) 2.5-2.5 MCG/ACT AERS Inhale 2 puffs into the lungs daily. 07/26/17  Yes Mannam, Praveen, MD  warfarin (COUMADIN) 5 MG tablet Take 1-1.5 tablets by mouth daily as directed by coumadin clinic Patient taking differently: Take 2.5-5 mg by mouth See admin instructions. 5 mg by mouth at night on Sun/Tues/Thurs/Sat and 2.5 mg on Mon/Wed/Fri 03/08/16  Yes Alexander Latch, MD  clindamycin (CLEOCIN) 300 MG capsule Take 2 tablets by mouth one hour prior to dental appointments Patient not taking: Reported on 10/28/2017 08/25/17   Eileen Stanford, PA-C  polyethylene glycol (MIRALAX / GLYCOLAX) packet Take 17 g by mouth daily. Patient not taking: Reported on 10/28/2017 12/02/16   Florencia Reasons, MD    Physical Exam: Vitals:   10/28/17 1530 10/28/17 1600 10/28/17 1630 10/28/17 1700  BP: (!) 122/45 (!) 124/43 (!) 125/50 (!) 145/65  Pulse: 64 69 62 61  Resp: (!) 24 16 15  (!) 21  Temp:      TempSrc:      SpO2: 97% 97% 96% 94%    Constitutional: NAD,obese Vitals:   10/28/17 1530 10/28/17 1600 10/28/17 1630 10/28/17 1700  BP: (!) 122/45 (!) 124/43 (!) 125/50 (!) 145/65  Pulse: 64 69 62 61  Resp: (!) 24 16 15  (!) 21  Temp:      TempSrc:      SpO2: 97% 97% 96% 94%   Eyes: PERRL, lids and conjunctivae normal ENMT: Mucous membranes are moist. Posterior pharynx clear of any exudate or lesions.Normal dentition.  Neck: normal, supple, no masses, no thyromegaly Respiratory: clear to auscultation bilaterally, no wheezing, no crackles. Normal respiratory effort. No accessory muscle use.  Cardiovascular: Regular rate and rhythm, no murmurs / rubs / gallops. No extremity edema. 2+ pedal pulses. No carotid  bruits.  Abdomen: no tenderness, no masses palpated. No hepatosplenomegaly. Bowel sounds positive.  Musculoskeletal: no clubbing / cyanosis. No joint deformity upper and lower extremities. Good ROM, no contractures. Normal muscle tone.  Skin: Ecchymoses on upper extremities, lesions, ulcers. No induration AV fistula on the right arm Neurologic: CN 2-12 grossly intact. Sensation intact, DTR normal. Strength 5/5 in all 4.  Psychiatric: Normal judgment and insight. Alert and oriented x 3. Normal  mood.   Foley Catheter:None  Labs on Admission: I have personally reviewed following labs and imaging studies  CBC: Recent Labs  Lab 10/28/17 1428  WBC 9.5  NEUTROABS 7.4  HGB 11.9*  HCT 37.6*  MCV 102.5*  PLT 659   Basic Metabolic Panel: Recent Labs  Lab 10/28/17 1428  NA 141  K 3.4*  CL 96*  CO2 32  GLUCOSE 143*  BUN 16  CREATININE 3.53*  CALCIUM 8.7*   GFR: CrCl cannot be calculated (Unknown ideal weight.). Liver Function Tests: Recent Labs  Lab 10/28/17 1428  AST 23  ALT 24  ALKPHOS 68  BILITOT 0.5  PROT 7.0  ALBUMIN 3.5   No results for input(s): LIPASE, AMYLASE in the last 168 hours. Recent Labs  Lab 10/28/17 1428  AMMONIA 41*   Coagulation Profile: Recent Labs  Lab 10/26/17 1306 10/28/17 1428  INR 1.6* 2.35   Cardiac Enzymes: No results for input(s): CKTOTAL, CKMB, CKMBINDEX, TROPONINI in the last 168 hours. BNP (last 3 results) Recent Labs    11/11/16 1143  PROBNP 18,028*   HbA1C: No results for input(s): HGBA1C in the last 72 hours. CBG: No results for input(s): GLUCAP in the last 168 hours. Lipid Profile: No results for input(s): CHOL, HDL, LDLCALC, TRIG, CHOLHDL, LDLDIRECT in the last 72 hours. Thyroid Function Tests: No results for input(s): TSH, T4TOTAL, FREET4, T3FREE, THYROIDAB in the last 72 hours. Anemia Panel: No results for input(s): VITAMINB12, FOLATE, FERRITIN, TIBC, IRON, RETICCTPCT in the last 72 hours. Urine analysis:      Component Value Date/Time   COLORURINE RED (A) 10/28/2017 1442   APPEARANCEUR CLOUDY (A) 10/28/2017 1442   LABSPEC 1.012 10/28/2017 1442   PHURINE 8.0 10/28/2017 1442   GLUCOSEU 150 (A) 10/28/2017 1442   HGBUR LARGE (A) 10/28/2017 1442   BILIRUBINUR NEGATIVE 10/28/2017 1442   KETONESUR NEGATIVE 10/28/2017 1442   PROTEINUR 100 (A) 10/28/2017 1442   UROBILINOGEN 0.2 11/12/2014 1318   NITRITE NEGATIVE 10/28/2017 1442   LEUKOCYTESUR MODERATE (A) 10/28/2017 1442    Radiological Exams on Admission: Dg Chest Port 1 View  Result Date: 10/28/2017 CLINICAL DATA:  Two-month history of generalized weakness and hematuria. EXAM: PORTABLE CHEST 1 VIEW COMPARISON:  05/12/2017, 04/28/2017 and earlier, including CT chest 01/04/2018 and earlier. FINDINGS: Cardiac silhouette moderately enlarged, unchanged. Prior TAVR. RIGHT subclavian dual lead transvenous pacemaker unchanged and appears intact. Thoracic aorta tortuous and atherosclerotic, unchanged. Hilar and mediastinal contours otherwise unremarkable. Mild pulmonary venous hypertension without overt edema, unchanged. Mild atelectasis involving the lung bases, LEFT greater than RIGHT. Lungs otherwise clear. No confluent airspace consolidation. No visible pleural effusions. IMPRESSION: 1. Mild bibasilar atelectasis, LEFT greater than RIGHT. No acute cardiopulmonary disease otherwise. 2. Stable moderate cardiomegaly without evidence of pulmonary edema. Electronically Signed   By: Evangeline Dakin M.D.   On: 10/28/2017 15:03     Assessment/Plan Principal Problem:   UTI (urinary tract infection) Active Problems:   Anemia, unspecified   ESRD (end stage renal disease) on dialysis ([place])   Hyperlipidemia   Persistent atrial fibrillation ([place])   Long term (current) use of anticoagulants [Z79.01]   Hematuria  Urinary tract infection:Urine culture done as an outpatient  showed Enterococcus faecalis which was drug-resistant.  Culture report not available on  epic. Urinalysis done here was suggestive of UTI.  I have discussed with Dr. Graylon Good today.  She said he was being treated with IV vancomycin for Enterococcus faecalis UTI as an outpatient.  Patient was allergic to penicillin.  She wants the patient to be admitted for further work-up including blood culture, urine culture and for the need of IV antibiotics.  She recommends to continue IV vancomycin post hemodialysis and ceftazidime.  ID Alexander continue to follow him. Currently patient is hemodynamically stable.  He does not appear septic.  He was afebrile.  He complains of dysuria and burning sensation while passing urine.  Blood cultures and urine cultures have been sent.  We Alexander follow-up .  Continue antibiotics.  ESRD on dialysis: Dialyzed on Monday, Wednesday and Friday.  He was dialyzed today.  I have notified nephrology and they are aware about his presence here.  Hematuria: Etiology unknown. Could also be  associated with ongoing cystitis/UTI.  I discussed with Dr. Alinda Money today .  He recommends to follow-up as an outpatient in their office.  He is not a candidate for any kind of intervention because of the active infection going on.  He follows with urologist Dr. Pilar Jarvis.  There is no active bleeding.  So Alexander continue warfarin.  Permanent A. fib: Currently rate is controlled.  On digoxin and Cardizem at home which we Alexander continue.  He is on warfarin for anticoagulation.  We Alexander continue to monitor INR.  We Alexander continue warfarin.  He is also status post pacemaker.  He has history of TAVR.  Follows with cardiology.  Hyperlipidemia: Continue statin  Anemia: Mild.Secondary to ESRD.  Currently H&H is stable.  Hypokalemia: Supplemented  Deconditioning/debility: Patient increasingly weak and declining since last few weeks.  Walks with the help of walker.  Alexander request for physical therapy evaluation.  Lactic acidosis: Resolved  Patient is on chronic prednisone therapy for arthritis.  He is also  on bromocriptine for bilateral breast enlargement.    Severity of Illness: The appropriate patient status for this patient is observation  DVT prophylaxis: Coumadin Code Status: Full Family Communication: Daughters present at the bedside Consults called: ID, nephrology, urology     Shelly Coss MD [facility] Pager [phone]  If 7PM-7AM, please contact night-coverage [email or web address] Password [facility]  10/28/2017, 5:29 PM

## 2017-10-28 NOTE — ED Notes (Signed)
Patient given diet coke. 

## 2017-10-28 NOTE — ED Notes (Signed)
ED TO INPATIENT HANDOFF REPORT  Name/Age/Gender Alexander [name] 77 y.o. male  Code Status    Code Status Orders  (From admission, onward)        Start     Ordered   10/28/17 1726  Full code  Continuous     10/28/17 1725    Code Status History    Date Active Date Inactive Code Status Order ID Comments User Context   05/12/2017 2343 05/14/2017 1703 Full Code [id number]  Rise Patience, MD Inpatient   11/24/2016 0115 12/02/2016 2110 Full Code [id number]  Reubin Milan, MD ED   11/24/2016 0115 11/24/2016 0115 Full Code [id number]  Reubin Milan, MD ED   08/31/2016 1556 09/02/2016 1923 Full Code [id number]  Gaye Pollack, MD Inpatient   08/20/2016 1724 08/20/2016 2241 Full Code [id number]  Burnell Blanks, MD Inpatient   03/11/2015 0957 03/12/2015 1554 Full Code [id number]  Constance Haw, MD Inpatient   04/04/2014 0308 04/06/2014 1758 Full Code [id number]  Allyne Gee, MD ED    Advance Directive Documentation     Most Recent Value  Type of Advance Directive  Healthcare Power of [place], Living will  Pre-existing out of facility DNR order (yellow form or pink MOST form)  -  "MOST" Form in Place?  -      Home/SNF/Other Home  Chief Complaint blood in urine   Level of Care/Admitting Diagnosis ED Disposition    ED Disposition Condition [place]: [place] [100100]  Level of Care: Med-Surg [16]  Diagnosis: UTI (urinary tract infection) [619509]  Admitting Physician: Shelly Coss [[id number]]  Attending Physician: Shelly Coss [[id number]]  Estimated length of stay: past midnight tomorrow  Certification:: I certify this patient will need inpatient services for at least 2 midnights  PT Class (Do Not Modify): Inpatient [101]  PT Acc Code (Do Not Modify): Private [1]       Medical History Past Medical History:  Diagnosis Date  . Anemia   . Aortic stenosis 06/15/12   TEE - EF 38-25%; grade 1 diastolic dysfunction;  mild/mod aortic valve stenosis; Mitral valve had calcified annulus, mild pulm htn PA peak pressure 60mHg  . Barrett's esophagus 05/2003  . Bradycardia 2017   [employer] 2240 Assurity dual-lead pacemaker  . Carpal tunnel syndrome, bilateral 11/03/2015  . Colon polyps   . CVA (cerebral infarction)    2004/affected left side  . Depression   . Diabetes mellitus without complication ([facility]   . Diabetic peripheral neuropathy ([facility] 10/02/2015  . Diverticulosis   . Dyspnea    with exertion  . ESRD (end stage renal disease) [place] [facility] ([facility]    "[facility]; [place]; MWF" (05/12/2017)  . GERD (gastroesophageal reflux disease)   . Glaucoma   . History of kidney stones   . Hyperlipidemia   . Hypertension   . Legally blind   . Macular degeneration    both eyes  . Orthostatic hypotension 09/09/2015  . Paroxysmal atrial fibrillation (HCC)   . Peptic ulcer    bleeding, 1969  . Presence of permanent cardiac pacemaker   . S/P epidural steroid injection    last  injection over 10 years ago  . Seasonal allergies   . Tubular adenoma of colon 07/2001    Allergies Allergies  Allergen Reactions  . Penicillins Swelling and Rash    Has patient had a PCN reaction causing immediate rash, facial/tongue/throat swelling, SOB or lightheadedness with hypotension: Yes  Has patient had a PCN reaction causing severe rash involving mucus membranes or skin necrosis: No Has patient had a PCN reaction that required hospitalization: No Has patient had a PCN reaction occurring within the last 10 years: No If all of the above answers are "NO", then may proceed with Cephalosporin use.   . Atorvastatin     MYALGIA   . Codeine Nausea Only  . Tramadol Nausea Only    IV Location/Drains/Wounds Patient Lines/Drains/Airways Status   Active Line/Drains/Airways    Name:   Placement date:   Placement time:   Site:   Days:   Peripheral IV 10/28/17 Right Antecubital   10/28/17    1435    Antecubital   less than 1   Fistula  / Graft Left Forearm Arteriovenous fistula   -    -    Forearm      Incision (Closed) 03/11/15 Chest Right   03/11/15    0945     962   Incision (Closed) 08/31/16 Groin Left   08/31/16    1302     423   Incision (Closed) 08/31/16 Groin Right   08/31/16    1302     423          Labs/Imaging Results for orders placed or performed during the hospital encounter of 10/28/17 (from the past 48 hour(s))  Comprehensive metabolic panel     Status: Abnormal   Collection Time: 10/28/17  2:28 PM  Result Value Ref Range   Sodium 141 135 - 145 mmol/L   Potassium 3.4 (L) 3.5 - 5.1 mmol/L   Chloride 96 (L) 98 - 111 mmol/L   CO2 32 22 - 32 mmol/L   Glucose, Bld 143 (H) 70 - 99 mg/dL   BUN 16 8 - 23 mg/dL   Creatinine, Ser 3.53 (H) 0.61 - 1.24 mg/dL   Calcium 8.7 (L) 8.9 - 10.3 mg/dL   Total Protein 7.0 6.5 - 8.1 g/dL   Albumin 3.5 3.5 - 5.0 g/dL   AST 23 15 - 41 U/L   ALT 24 0 - 44 U/L   Alkaline Phosphatase 68 38 - 126 U/L   Total Bilirubin 0.5 0.3 - 1.2 mg/dL   GFR calc non Af Amer 15 (L) >60 mL/min   GFR calc Af Amer 18 (L) >60 mL/min    Comment: (NOTE) The eGFR has been calculated using the CKD EPI equation. This calculation has not been validated in all clinical situations. eGFR's persistently <60 mL/min signify possible Chronic Kidney Disease.    Anion gap 13 5 - 15    Comment: Performed at [facility], [place] [address], [place], [place] [address]  CBC with Differential     Status: Abnormal   Collection Time: 10/28/17  2:28 PM  Result Value Ref Range   WBC 9.5 4.0 - 10.5 K/uL   RBC 3.67 (L) 4.22 - 5.81 MIL/uL   Hemoglobin 11.9 (L) 13.0 - 17.0 g/dL   HCT 37.6 (L) 39.0 - 52.0 %   MCV 102.5 (H) 78.0 - 100.0 fL   MCH 32.4 26.0 - 34.0 pg   MCHC 31.6 30.0 - 36.0 g/dL   RDW 17.9 (H) 11.5 - 15.5 %   Platelets 200 150 - 400 K/uL   Neutrophils Relative % 77 %   Neutro Abs 7.4 1.7 - 7.7 K/uL   Lymphocytes Relative 15 %   Lymphs Abs 1.4 0.7 - 4.0 K/uL   Monocytes  Relative 6 %  Monocytes Absolute 0.5 0.1 - 1.0 K/uL   Eosinophils Relative 1 %   Eosinophils Absolute 0.1 0.0 - 0.7 K/uL   Basophils Relative 1 %   Basophils Absolute 0.1 0.0 - 0.1 K/uL    Comment: Performed at [facility], [place] [address] [place], [place] [address]  Protime-INR     Status: Abnormal   Collection Time: 10/28/17  2:28 PM  Result Value Ref Range   Prothrombin Time 25.5 (H) 11.4 - 15.2 seconds   INR 2.35     Comment: Performed at [facility], [place] [address] , [place] [address]  Ammonia     Status: Abnormal   Collection Time: 10/28/17  2:28 PM  Result Value Ref Range   Ammonia 41 (H) 9 - 35 umol/L    Comment: Performed at [facility], [place] [address] [place], [place] [address]  Digoxin level     Status: None   Collection Time: 10/28/17  2:28 PM  Result Value Ref Range   Digoxin Level 1.0 0.8 - 2.0 ng/mL    Comment: Performed at [facility], [place] [address] [place], [place] [address]  I-Stat CG4 Lactic Acid, ED     Status: Abnormal   Collection Time: 10/28/17  2:35 PM  Result Value Ref Range   Lactic Acid, Venous 2.02 (HH) 0.5 - 1.9 mmol/L   Comment NOTIFIED PHYSICIAN   Urinalysis, Routine w reflex microscopic     Status: Abnormal   Collection Time: 10/28/17  2:42 PM  Result Value Ref Range   Color, Urine RED (A) YELLOW    Comment: BIOCHEMICALS MAY BE AFFECTED BY COLOR   APPearance CLOUDY (A) CLEAR   Specific Gravity, Urine 1.012 1.005 - 1.030   pH 8.0 5.0 - 8.0   Glucose, UA 150 (A) NEGATIVE mg/dL   Hgb urine dipstick LARGE (A) NEGATIVE   Bilirubin Urine NEGATIVE NEGATIVE   Ketones, ur NEGATIVE NEGATIVE mg/dL   Protein, ur 100 (A) NEGATIVE mg/dL   Nitrite NEGATIVE NEGATIVE   Leukocytes, UA MODERATE (A) NEGATIVE   RBC / HPF >50 (H) 0 - 5 RBC/hpf   WBC, UA >50 (H) 0 - 5 WBC/hpf   Bacteria, UA MANY (A) NONE SEEN   WBC Clumps PRESENT    Non Squamous Epithelial 11-20 (A) NONE  SEEN    Comment: Performed at [facility], [place] [address], [place],  [address]  Urine rapid drug screen (hosp performed)     Status: None   Collection Time: 10/28/17  2:42 PM  Result Value Ref Range   Opiates NONE DETECTED NONE DETECTED   Cocaine NONE DETECTED NONE DETECTED   Benzodiazepines NONE DETECTED NONE DETECTED   Amphetamines NONE DETECTED NONE DETECTED   Tetrahydrocannabinol NONE DETECTED NONE DETECTED   Barbiturates NONE DETECTED NONE DETECTED    Comment: (NOTE) DRUG SCREEN FOR MEDICAL PURPOSES ONLY.  IF CONFIRMATION IS NEEDED FOR ANY PURPOSE, NOTIFY LAB WITHIN 5 DAYS. LOWEST DETECTABLE LIMITS FOR URINE DRUG SCREEN Drug Class                     Cutoff (ng/mL) Amphetamine and metabolites    1000 Barbiturate and metabolites    200 Benzodiazepine                 197 Tricyclics and metabolites     300 Opiates and metabolites        300 Cocaine and metabolites  300 THC                            50 Performed at [facility], [place] [address] [place], [place] [address]   I-Stat CG4 Lactic Acid, ED     Status: None   Collection Time: 10/28/17  5:10 PM  Result Value Ref Range   Lactic Acid, Venous 1.19 0.5 - 1.9 mmol/L   Dg Chest Port 1 View  Result Date: 10/28/2017 CLINICAL DATA:  Two-month history of generalized weakness and hematuria. EXAM: PORTABLE CHEST 1 VIEW COMPARISON:  05/12/2017, 04/28/2017 and earlier, including CT chest 01/04/2018 and earlier. FINDINGS: Cardiac silhouette moderately enlarged, unchanged. Prior TAVR. RIGHT subclavian dual lead transvenous pacemaker unchanged and appears intact. Thoracic aorta tortuous and atherosclerotic, unchanged. Hilar and mediastinal contours otherwise unremarkable. Mild pulmonary venous hypertension without overt edema, unchanged. Mild atelectasis involving the lung bases, LEFT greater than RIGHT. Lungs otherwise clear. No confluent airspace consolidation. No visible pleural  effusions. IMPRESSION: 1. Mild bibasilar atelectasis, LEFT greater than RIGHT. No acute cardiopulmonary disease otherwise. 2. Stable moderate cardiomegaly without evidence of pulmonary edema. Electronically Signed   By: Evangeline Dakin M.D.   On: 10/28/2017 15:03    Pending Labs Unresulted Labs (From admission, onward)   Start     Ordered   10/29/17 0500  CBC with Differential/Platelet  Tomorrow morning,   R     10/28/17 1726   10/29/17 0500  Lactic acid, plasma  Tomorrow morning,   R     10/28/17 1726   10/29/17 9702  Basic metabolic panel  Tomorrow morning,   R     10/28/17 1727   10/29/17 0500  Protime-INR  Daily,   R     10/28/17 1729   10/28/17 1617  Urine culture  Add-on,   STAT     10/28/17 1617   10/28/17 1403  Culture, blood (routine x 2)  BLOOD CULTURE X 2,   STAT     10/28/17 1403      Vitals/Pain Today's Vitals   10/28/17 1600 10/28/17 1630 10/28/17 1700 10/28/17 1730  BP: (!) 124/43 (!) 125/50 (!) 145/65 136/64  Pulse: 69 62 61 62  Resp: 16 15 (!) 21 19  Temp:      TempSrc:      SpO2: 97% 96% 94% 95%  PainSc:        Isolation Precautions No active isolations  Medications Medications  0.9 %  sodium chloride infusion (500 mLs Intravenous New Bag/Given 10/28/17 1711)  acetaminophen (TYLENOL) tablet 975 mg (has no administration in time range)  bromocriptine (PARLODEL) capsule 5 mg (has no administration in time range)  cinacalcet (SENSIPAR) tablet 30 mg (has no administration in time range)  digoxin (LANOXIN) tablet 62.5 mcg (has no administration in time range)  diltiazem (CARDIZEM CD) 24 hr capsule 120 mg (has no administration in time range)  dorzolamide-timolol (COSOPT) 22.3-6.8 MG/ML ophthalmic solution 1 drop (has no administration in time range)  pantoprazole (PROTONIX) EC tablet 40 mg (has no administration in time range)  pravastatin (PRAVACHOL) tablet 10 mg (has no administration in time range)  predniSONE (DELTASONE) tablet 5 mg (has no  administration in time range)  sertraline (ZOLOFT) tablet 50 mg (has no administration in time range)  sevelamer carbonate (RENVELA) tablet 800 mg (has no administration in time range)  polyethylene glycol (MIRALAX / GLYCOLAX) packet 17 g (has no administration in time range)  cefTAZidime (FORTAZ) 1  g in sodium chloride 0.9 % 100 mL IVPB (1 g Intravenous New Bag/Given 10/28/17 1712)    Mobility walks

## 2017-10-28 NOTE — ED Triage Notes (Signed)
Patient here from home reports that the infectious disease doctor called and was told to come here. Reports that he has drug resistance UTI that is "hard to treat".

## 2017-10-29 ENCOUNTER — Other Ambulatory Visit: Payer: Self-pay

## 2017-10-29 DIAGNOSIS — Z992 Dependence on renal dialysis: Secondary | ICD-10-CM

## 2017-10-29 DIAGNOSIS — R310 Gross hematuria: Secondary | ICD-10-CM | POA: Diagnosis not present

## 2017-10-29 DIAGNOSIS — I5030 Unspecified diastolic (congestive) heart failure: Secondary | ICD-10-CM | POA: Diagnosis present

## 2017-10-29 DIAGNOSIS — D649 Anemia, unspecified: Secondary | ICD-10-CM | POA: Diagnosis not present

## 2017-10-29 DIAGNOSIS — G454 Transient global amnesia: Secondary | ICD-10-CM | POA: Diagnosis not present

## 2017-10-29 DIAGNOSIS — E785 Hyperlipidemia, unspecified: Secondary | ICD-10-CM

## 2017-10-29 DIAGNOSIS — R410 Disorientation, unspecified: Secondary | ICD-10-CM | POA: Diagnosis not present

## 2017-10-29 DIAGNOSIS — Z880 Allergy status to penicillin: Secondary | ICD-10-CM | POA: Diagnosis not present

## 2017-10-29 DIAGNOSIS — G9389 Other specified disorders of brain: Secondary | ICD-10-CM | POA: Diagnosis present

## 2017-10-29 DIAGNOSIS — Z952 Presence of prosthetic heart valve: Secondary | ICD-10-CM | POA: Diagnosis not present

## 2017-10-29 DIAGNOSIS — E876 Hypokalemia: Secondary | ICD-10-CM | POA: Diagnosis present

## 2017-10-29 DIAGNOSIS — D631 Anemia in chronic kidney disease: Secondary | ICD-10-CM | POA: Diagnosis present

## 2017-10-29 DIAGNOSIS — R4020 Unspecified coma: Secondary | ICD-10-CM | POA: Diagnosis not present

## 2017-10-29 DIAGNOSIS — E1122 Type 2 diabetes mellitus with diabetic chronic kidney disease: Secondary | ICD-10-CM | POA: Diagnosis present

## 2017-10-29 DIAGNOSIS — R5381 Other malaise: Secondary | ICD-10-CM

## 2017-10-29 DIAGNOSIS — N186 End stage renal disease: Secondary | ICD-10-CM | POA: Diagnosis not present

## 2017-10-29 DIAGNOSIS — Z7901 Long term (current) use of anticoagulants: Secondary | ICD-10-CM

## 2017-10-29 DIAGNOSIS — E872 Acidosis: Secondary | ICD-10-CM | POA: Diagnosis present

## 2017-10-29 DIAGNOSIS — R319 Hematuria, unspecified: Secondary | ICD-10-CM

## 2017-10-29 DIAGNOSIS — N3289 Other specified disorders of bladder: Secondary | ICD-10-CM | POA: Diagnosis not present

## 2017-10-29 DIAGNOSIS — Z950 Presence of cardiac pacemaker: Secondary | ICD-10-CM

## 2017-10-29 DIAGNOSIS — R627 Adult failure to thrive: Secondary | ICD-10-CM | POA: Diagnosis not present

## 2017-10-29 DIAGNOSIS — R300 Dysuria: Secondary | ICD-10-CM | POA: Diagnosis not present

## 2017-10-29 DIAGNOSIS — E1142 Type 2 diabetes mellitus with diabetic polyneuropathy: Secondary | ICD-10-CM | POA: Diagnosis present

## 2017-10-29 DIAGNOSIS — R7881 Bacteremia: Secondary | ICD-10-CM | POA: Diagnosis not present

## 2017-10-29 DIAGNOSIS — Z98818 Other dental procedure status: Secondary | ICD-10-CM | POA: Diagnosis not present

## 2017-10-29 DIAGNOSIS — R634 Abnormal weight loss: Secondary | ICD-10-CM | POA: Diagnosis not present

## 2017-10-29 DIAGNOSIS — N2581 Secondary hyperparathyroidism of renal origin: Secondary | ICD-10-CM | POA: Diagnosis present

## 2017-10-29 DIAGNOSIS — Z7952 Long term (current) use of systemic steroids: Secondary | ICD-10-CM | POA: Diagnosis not present

## 2017-10-29 DIAGNOSIS — R531 Weakness: Secondary | ICD-10-CM | POA: Diagnosis not present

## 2017-10-29 DIAGNOSIS — N3001 Acute cystitis with hematuria: Principal | ICD-10-CM

## 2017-10-29 DIAGNOSIS — A491 Streptococcal infection, unspecified site: Secondary | ICD-10-CM | POA: Diagnosis not present

## 2017-10-29 DIAGNOSIS — I120 Hypertensive chronic kidney disease with stage 5 chronic kidney disease or end stage renal disease: Secondary | ICD-10-CM | POA: Diagnosis present

## 2017-10-29 DIAGNOSIS — R001 Bradycardia, unspecified: Secondary | ICD-10-CM | POA: Diagnosis not present

## 2017-10-29 DIAGNOSIS — Z888 Allergy status to other drugs, medicaments and biological substances status: Secondary | ICD-10-CM

## 2017-10-29 DIAGNOSIS — Z885 Allergy status to narcotic agent status: Secondary | ICD-10-CM

## 2017-10-29 DIAGNOSIS — Z87891 Personal history of nicotine dependence: Secondary | ICD-10-CM

## 2017-10-29 DIAGNOSIS — Z8619 Personal history of other infectious and parasitic diseases: Secondary | ICD-10-CM

## 2017-10-29 DIAGNOSIS — N481 Balanitis: Secondary | ICD-10-CM | POA: Diagnosis not present

## 2017-10-29 DIAGNOSIS — I481 Persistent atrial fibrillation: Secondary | ICD-10-CM

## 2017-10-29 DIAGNOSIS — Z8673 Personal history of transient ischemic attack (TIA), and cerebral infarction without residual deficits: Secondary | ICD-10-CM | POA: Diagnosis not present

## 2017-10-29 DIAGNOSIS — Z87440 Personal history of urinary (tract) infections: Secondary | ICD-10-CM | POA: Diagnosis not present

## 2017-10-29 DIAGNOSIS — B952 Enterococcus as the cause of diseases classified elsewhere: Secondary | ICD-10-CM | POA: Diagnosis present

## 2017-10-29 LAB — URINALYSIS, ROUTINE W REFLEX MICROSCOPIC
Bacteria, UA: NONE SEEN
Bilirubin Urine: NEGATIVE
Glucose, UA: 50 mg/dL — AB
Ketones, ur: NEGATIVE mg/dL
NITRITE: NEGATIVE
Protein, ur: 100 mg/dL — AB
RBC / HPF: >50 RBC/hpf — ABNORMAL HIGH (ref 0–5)
SPECIFIC GRAVITY, URINE: 1.01 (ref 1.005–1.030)
pH: 8 (ref 5.0–8.0)

## 2017-10-29 LAB — BASIC METABOLIC PANEL
Anion gap: 13 (ref 5–15)
BUN: 24 mg/dL — AB (ref 8–23)
CO2: 31 mmol/L (ref 22–32)
Calcium: 8.9 mg/dL (ref 8.9–10.3)
Chloride: 97 mmol/L — ABNORMAL LOW (ref 98–111)
Creatinine, Ser: 5.61 mg/dL — ABNORMAL HIGH (ref 0.61–1.24)
GFR, EST AFRICAN AMERICAN: 10 mL/min — AB (ref >60)
GFR, EST NON AFRICAN AMERICAN: 9 mL/min — AB (ref >60)
Glucose, Bld: 139 mg/dL — ABNORMAL HIGH (ref 70–99)
POTASSIUM: 4 mmol/L (ref 3.5–5.1)
SODIUM: 141 mmol/L (ref 135–145)

## 2017-10-29 LAB — CBC
HEMATOCRIT: 36.3 % — AB (ref 39.0–52.0)
Hemoglobin: 11.2 g/dL — ABNORMAL LOW (ref 13.0–17.0)
MCH: 31.6 pg (ref 26.0–34.0)
MCHC: 30.9 g/dL (ref 30.0–36.0)
MCV: 102.5 fL — ABNORMAL HIGH (ref 78.0–100.0)
Platelets: 172 10*3/uL (ref 150–400)
RBC: 3.54 MIL/uL — AB (ref 4.22–5.81)
RDW: 18.1 % — AB (ref 11.5–15.5)
WBC: 8.2 10*3/uL (ref 4.0–10.5)

## 2017-10-29 LAB — LACTIC ACID, PLASMA: Lactic Acid, Venous: 1.6 mmol/L (ref 0.5–1.9)

## 2017-10-29 LAB — MRSA PCR SCREENING: MRSA by PCR: NEGATIVE

## 2017-10-29 LAB — PROTIME-INR
INR: 2
Prothrombin Time: 22.5 seconds — ABNORMAL HIGH (ref 11.4–15.2)

## 2017-10-29 MED ORDER — WARFARIN SODIUM 5 MG PO TABS
5.0000 mg | ORAL_TABLET | Freq: Once | ORAL | Status: AC
  Administered 2017-10-29: 5 mg via ORAL
  Filled 2017-10-29: qty 1

## 2017-10-29 NOTE — Progress Notes (Signed)
[place] for warfarin Indication: hx atrial fibrillation  Allergies  Allergen Reactions  . Penicillins Swelling and Rash    Has patient had a PCN reaction causing immediate rash, facial/tongue/throat swelling, SOB or lightheadedness with hypotension: Yes Has patient had a PCN reaction causing severe rash involving mucus membranes or skin necrosis: No Has patient had a PCN reaction that required hospitalization: No Has patient had a PCN reaction occurring within the last 10 years: No If all of the above answers are "NO", then may proceed with Cephalosporin use.   . Atorvastatin     MYALGIA   . Codeine Nausea Only  . Tramadol Nausea Only    Patient Measurements: Height: 5\' 10"  (177.8 cm) Weight: 193 lb 9 oz (87.8 kg) IBW/kg (Calculated) : 73 Heparin Dosing Weight:   Vital Signs: Temp: 98.5 F (36.9 C) (08/03 0601) Temp Source: Oral (08/03 0601) BP: 126/64 (08/03 1009) Pulse Rate: 65 (08/03 1009)  Labs: Recent Labs    10/26/17 1306 10/28/17 1428 10/29/17 0824  HGB  --  11.9* 11.2*  HCT  --  37.6* 36.3*  PLT  --  200 172  LABPROT  --  25.5* 22.5*  INR 1.6* 2.35 2.00  CREATININE  --  3.53* 5.61*    Estimated Creatinine Clearance: 12.3 mL/min (A) (by C-G formula based on SCr of 5.61 mg/dL (H)).   Medications:  Per outpt AC clinic note on 7/31--> INR 1.6, dose increased to 7.5 mg on 7/31 and then resume 5 mg daily except 2.5 mg on MWF (last dose taken on 8/1 per daughter)  Assessment: Alexander [name] is a 77 y.o. male with ESRD on HD MWF, hematuria, and afib on warfarin PTA presented to the ED on 10/28/17 with c/o lethargy and no appetite.  Pt's daughter reported that he saw his urologist Dr. Pilar Jarvis about two weeks ago for workup of hematuria.  Dr. Pilar Jarvis suspected that hematuria was secondary to UTI and referred pt to the [facility] for treatment of infection.  To resume warfarin inpatient.  - INR is therapeutic at 2.00  Goal of  Therapy:  INR 2-3 Monitor platelets by anticoagulation protocol: Yes   Plan:  - warfarin 5 mg PO x1 (home dose) - daily INR - monitor for s/s bleeding especially for severity of hematuria   Ameia Morency A. Levada Dy, PharmD, [place] Pager: [phone] Please utilize Amion for appropriate phone number to reach the unit pharmacist ([place])   10/29/2017,10:34 AM

## 2017-10-29 NOTE — Consult Note (Signed)
Date of Admission:  10/28/2017          Reason for Consult: Dysuria, malaise, confusion, weakness despite active abx vs a "uti"    Referring Provider: Dr. Karleen Hampshire   Assessment:  1. New dysuria that began in July refractory to multiple courses of antibiotics including ciprofloxacin and doxycycline Macrobid including vancomycin IV for Enterococcus in urine (only had 10K CFU isolated) 2. Exposure to clindamycin in late June in preparation for dental procedure 3. Uncircumcised 4. #1-3 raise question of yeast infection and balanitis rather than UTI 5. Confusion and clinical deterioration 6. Hx of Streptococcus gallyolyticus (BOVIS) bacteremia in the context of TAVR and Pacemaker w negative TEE sp IV antibiotics x 2 weeks in September of 2018  7. Hematuria 8. ESRD on HD 9. Atrial fibrillation on coumadin  Plan:  1. Follow-up urine culture from admission but I am very skeptical that a urinary tract infection is responsible for his ongoing symptoms 2. Continue antimicrobials in the meantime 3. Family asked me to obtain another urine culture since I raised the prospect that his initial culture might not have been done through a "clean catch" 4. If urine cultures and admission is unrevealing I would stop his antibacterial antibiotics and treat him with fluconazole for presumed balanitis due to Candida 5. If we can no longer posit urinary tract infection as the cause of his clinical deterioration he needs a thorough evaluation for underlying cause 6. Check TTE 7. May need to consult Neurology and image CNS 8. I will inquire as to whether he had colonoscopy given prior Strep gallyolyticus bacterermia  Principal Problem:   UTI (urinary tract infection) Active Problems:   Anemia, unspecified   ESRD (end stage renal disease) on dialysis ([place])   Hyperlipidemia   Persistent atrial fibrillation ([place])   Long term (current) use of anticoagulants [Z79.01]   Hematuria   Scheduled Meds: .  [START ON 10/31/2017] bromocriptine  5 mg Oral Q M,W,F  . cinacalcet  30 mg Oral BID WC  . digoxin  62.5 mcg Oral Daily  . diltiazem  120 mg Oral Daily  . dorzolamide-timolol  1 drop Both Eyes BID  . pantoprazole  40 mg Oral Daily  . polyethylene glycol  17 g Oral Daily  . pravastatin  10 mg Oral Daily  . predniSONE  5 mg Oral QHS  . sertraline  50 mg Oral Daily  . sevelamer carbonate  800 mg Oral TID WC  . vancomycin variable dose per unstable renal function (pharmacist dosing)   Does not apply See admin instructions  . warfarin  5 mg Oral ONCE-1800  . Warfarin - Pharmacist Dosing Inpatient   Does not apply q1800   Continuous Infusions: . sodium chloride Stopped (10/29/17 0559)  . [START ON 10/31/2017] cefTAZidime (FORTAZ)  IV     PRN Meds:.sodium chloride, acetaminophen  HPI: Alexander [name] is a 77 y.o. male who has end-stage renal disease, has had TAVR, has had atrial fibrillation and has been on Coumadin and also has had a pacemaker placed.  Actually seen by our team in September after he had been admitted with Streptococcus gallyolyticus bacteremia. He had TEE that was negative for vegetation and Pacemaker remained in place. He with 2 weeks of IV antibiotics from date of first negative blood culture.  Do not know if surveillance blood cultures were ever done after that episode.  As I do not know if he underwent endoscopy to screen for colon cancer given the association  between this bacteria and neoplasm in the colon.   has been is been on hemodialysis and has typically able to continue to produce urine once a day.  In late June he was having preparations for dental procedure and was given clindamycin by his dentist.  Few weeks after this he began noticing dysuria and frequent urination with hematuria.  Was concern for a urinary tract infection and he was treated with various antibiotics including ciprofloxacin doxycycline and nitrofurantoin.  He was seen by urology that we do not  think that a cystoscopy was performed.  Urine culture was obtained by urology which yielded only 10,000 colony-forming units of enterococcus.  He was initiated on vancomycin with dialysis and referred to our infectious disease clinic.  He was seen by Janene Madeira in our clinic and per the patient's preference was continued on IV vancomycin.  Despite being on IV vancomycin he has not had much improvement whatsoever.  Instead he has become more lethargic with worsening appetite.  His wife obtained a urine sample which she brought and I believe was submitted for culture when he came to the emergency department.  Blood cultures were obtained as well and he was initiated on vancomycin and ceftazidime.  I am very skeptical that this man never had a urinary tract infection.  I think it is more likely that he is suffering from balanitis due to the clindamycin he received for his dental procedure.  I am not close to this possibility but again 10,000 colony-forming units of enterococcus is not consistent with a urinary tract infection and that is the only data that I have available to me through epic  I suspect the hematuria is not related to a urinary tract infection but due to fluctuations in his INR in the context of him receiving antimicrobial therapy and potentially not having had his Coumadin dose adjusted.  I am quite concerned however about his clinical deterioration..  Certainly balanitis might explain his dysuria, not his confusion weakness and worsening functional status.  Recurrent bacteremias and involvement of his prosthetic valve or his pacemaker though could be such an explanation.  I will order a 2D echocardiogram for now and as mentioned before we will continue IV antibiotics for now.     Review of Systems: Review of Systems  Constitutional: Positive for chills, malaise/fatigue and weight loss. Negative for diaphoresis and fever.  HENT: Negative for congestion, hearing loss, sore  throat and tinnitus.   Eyes: Negative for blurred vision and double vision.  Respiratory: Negative for cough, sputum production, shortness of breath and wheezing.   Cardiovascular: Negative for chest pain, palpitations and leg swelling.  Gastrointestinal: Negative for abdominal pain, blood in stool, constipation, diarrhea, heartburn, melena, nausea and vomiting.  Genitourinary: Positive for dysuria, frequency and hematuria. Negative for flank pain.  Musculoskeletal: Positive for back pain. Negative for falls, joint pain and myalgias.  Skin: Negative for itching and rash.  Neurological: Positive for dizziness. Negative for sensory change, focal weakness, loss of consciousness, weakness and headaches.  Endo/Heme/Allergies: Does not bruise/bleed easily.  Psychiatric/Behavioral: Positive for depression and memory loss. Negative for suicidal ideas. The patient is not nervous/anxious.     Past Medical History:  Diagnosis Date  . Anemia   . Aortic stenosis 06/15/12   TEE - EF 62-83%; grade 1 diastolic dysfunction; mild/mod aortic valve stenosis; Mitral valve had calcified annulus, mild pulm htn PA peak pressure 45mmHg  . Barrett's esophagus 05/2003  . Bradycardia 2017   [place]  Assurity dual-lead pacemaker  . Carpal tunnel syndrome, bilateral 11/03/2015  . Colon polyps   . CVA (cerebral infarction)    2004/affected left side  . Depression   . Diabetes mellitus without complication ([place])   . Diabetic peripheral neuropathy ([place]) 10/02/2015  . Diverticulosis   . Dyspnea    with exertion  . ESRD (end stage renal disease) on dialysis ([place])    "[facility]; [place]; MWF" (05/12/2017)  . GERD (gastroesophageal reflux disease)   . Glaucoma   . History of kidney stones   . Hyperlipidemia   . Hypertension   . Legally blind   . Macular degeneration    both eyes  . Orthostatic hypotension 09/09/2015  . Paroxysmal atrial fibrillation ([facility])   . Peptic ulcer    bleeding, 1969  . Presence of  permanent cardiac pacemaker   . S/P epidural steroid injection    last  injection over 10 years ago  . Seasonal allergies   . Tubular adenoma of colon 07/2001    Social History   Tobacco Use  . Smoking status: Former Smoker    Packs/day: 2.00    Years: 45.00    Pack years: 90.00    Types: Cigarettes    Last attempt to quit: 01/20/1998    Years since quitting: 19.7  . Smokeless tobacco: Never Used  Substance Use Topics  . Alcohol use: No    Alcohol/week: 0.0 oz  . Drug use: No    Family History  Problem Relation Age of Onset  . Stomach cancer Mother   . Hypertension Father        Died of heart attack  . Heart attack Father   . Stroke Sister   . Heart disease Sister   . Cancer Brother   . Colon cancer Neg Hx    Allergies  Allergen Reactions  . Penicillins Swelling and Rash    Has patient had a PCN reaction causing immediate rash, facial/tongue/throat swelling, SOB or lightheadedness with hypotension: Yes Has patient had a PCN reaction causing severe rash involving mucus membranes or skin necrosis: No Has patient had a PCN reaction that required hospitalization: No Has patient had a PCN reaction occurring within the last 10 years: No If all of the above answers are "NO", then may proceed with Cephalosporin use.   . Atorvastatin     MYALGIA   . Codeine Nausea Only  . Tramadol Nausea Only    OBJECTIVE: Blood pressure 126/64, pulse 65, temperature 98.5 F (36.9 C), temperature source Oral, resp. rate 17, height 5\' 10"  (1.778 m), weight 193 lb 9 oz (87.8 kg), SpO2 96 %.  Physical Exam  Constitutional: He appears well-developed and well-nourished. He is cooperative. He does not appear ill. No distress.  HENT:  Head: Normocephalic and atraumatic.  Right Ear: Hearing and external ear normal.  Left Ear: Hearing and external ear normal.  Nose: No rhinorrhea or nasal deformity. No epistaxis.  Eyes: Pupils are equal, round, and reactive to light. Conjunctivae and EOM  are normal. Right conjunctiva is not injected. Left conjunctiva is not injected. No scleral icterus.  Neck: Normal range of motion. Neck supple. No JVD present.  Cardiovascular: S1 normal, S2 normal and normal heart sounds. An irregularly irregular rhythm present. Exam reveals no friction rub.  Pulmonary/Chest: Effort normal and breath sounds normal. No stridor. No respiratory distress. He has no wheezes. He has no rales. He exhibits no tenderness.  Abdominal: Soft. Normal appearance and bowel sounds are normal. He exhibits no  distension, no ascites and no mass. There is no hepatosplenomegaly. There is no tenderness. There is no guarding.  Genitourinary:  Genitourinary Comments: He has a few areas of white material on glans underneath fore skin but no frank erythema  Musculoskeletal: Normal range of motion.       Right shoulder: Normal.       Left shoulder: Normal.       Right hip: Normal.       Left hip: Normal.       Right knee: Normal.       Left knee: Normal.  Lymphadenopathy:       Head (right side): No submandibular, no preauricular and no posterior auricular adenopathy present.       Head (left side): No submandibular, no preauricular and no posterior auricular adenopathy present.    He has no cervical adenopathy.       Right cervical: No superficial cervical and no deep cervical adenopathy present.      Left cervical: No superficial cervical and no deep cervical adenopathy present.  Neurological: He is alert. He has normal strength. No sensory deficit. Gait normal.  Skin: Skin is warm, dry and intact. No abrasion, no bruising, no ecchymosis, no lesion and no rash noted. He is not diaphoretic. No cyanosis or erythema. No pallor. Nails show no clubbing.  Psychiatric: He has a normal mood and affect. His speech is normal and behavior is normal. Judgment and thought content normal. Cognition and memory are normal. He is attentive.    Lab Results Lab Results  Component Value Date   WBC  8.2 10/29/2017   HGB 11.2 (L) 10/29/2017   HCT 36.3 (L) 10/29/2017   MCV 102.5 (H) 10/29/2017   PLT 172 10/29/2017    Lab Results  Component Value Date   CREATININE 5.61 (H) 10/29/2017   BUN 24 (H) 10/29/2017   NA 141 10/29/2017   K 4.0 10/29/2017   CL 97 (L) 10/29/2017   CO2 31 10/29/2017    Lab Results  Component Value Date   ALT 24 10/28/2017   AST 23 10/28/2017   ALKPHOS 68 10/28/2017   BILITOT 0.5 10/28/2017     Microbiology: Recent Results (from the past 240 hour(s))  MRSA PCR Screening     Status: None   Collection Time: 10/29/17  6:05 AM  Result Value Ref Range Status   MRSA by PCR NEGATIVE NEGATIVE Final    Comment:        The GeneXpert MRSA Assay (FDA approved for NASAL specimens only), is one component of a comprehensive MRSA colonization surveillance program. It is not intended to diagnose MRSA infection nor to guide or monitor treatment for MRSA infections. Performed at [facility], [place] [address] [place], [place] [address]     Alcide Evener, [facility] [place] [facility] [phone] pager  10/29/2017, 1:28 PM

## 2017-10-29 NOTE — Progress Notes (Signed)
PROGRESS NOTE    Alexander [name]  KVQ:[id number] DOB: 05-Jan-1941 DOA: 10/28/2017 PCP: Deland Pretty, MD    Brief Narrative:  Alexander [name] is a 77 y.o. male with medical history significant of ESRD on dialysis on M/W/F, history of a stroke, status post TAVR, permanent atrial fibrillation on pacemaker, on Coumadin, recurrent urinary tract infection who was sent by ID from the office to the emergency department for admission. As per the ID, patient urine culture grew Enterococcus faecalis which was resistant to most of the antibiotics. Since last 4 weeks, patient was having dysuria, burning sensation and hematuria.  Though he is on dialysis, he produces little bit of urine and he has been having increased frequency of urination. Patient will be admitted to [facility] for more work-up and IV antibiotics and possible dialysis on Monday. He was started on IV vancomycin and ceftazidime, and urine cultures.     Assessment & Plan:   Principal Problem:   UTI (urinary tract infection) Active Problems:   Anemia, unspecified   ESRD (end stage renal disease) on dialysis ([facility])   Hyperlipidemia   Persistent atrial fibrillation ([facility])   Long term (current) use of anticoagulants [Z79.01]   Hematuria   Urinary tract infection: outpatient urine cultures growing drug resistant Enterococcus fecalis. Pt currently on IV vancomycin and ceftazidime and repeat cultures are pending.  Pt denies any abdominal pain or flank pain, but continues to report some dysuria.  He has been afebrile and there is no leukocytosis.  Will request ID consultation on Monday for the duration of the antibiotics.    ESRD on HD.  Nephrology aware of the pts HD on MWF.    Hematuria;  Outpatient follow up with urology.    Atrial fibrillation, chronic On dig and cardizem.  On coumadin , .    Hyperlipidemia: Resume statin.   Anemia of chronic disease:  Secondary to ESRD.    Hypokalemia: replaced.   Hyperlipidemia: Resume  provachol.    Chronic osteoarthritis: Resume prednisone.        DVT prophylaxis:coumadin.  Code Status: full code.  Family Communication: daughters at bedside, discussed the plan of care with the patient and family.  Disposition Plan: pending clinical improvement and urine cultures.   Consultants:   Nephrology for HD.   Procedures:NONE.   Antimicrobials:  Vancomycin and ceftazidime since admission.   Subjective: Some dysuria, no chest pain or sob, no nausea or vomiting .  No abdominal pain.   Objective: Vitals:   10/28/17 1953 10/28/17 2101 10/29/17 0601 10/29/17 1009  BP: (!) 117/51 133/60 (!) 131/55 126/64  Pulse: 67 64 62 65  Resp: 19 18 17    Temp:  98.7 F (37.1 C) 98.5 F (36.9 C)   TempSrc:  Oral Oral   SpO2: 92% 97% 96%   Weight:  87.8 kg (193 lb 9 oz)    Height:  5\' 10"  (1.778 m)      Intake/Output Summary (Last 24 hours) at 10/29/2017 1135 Last data filed at 10/29/2017 0950 Gross per 24 hour  Intake 618.62 ml  Output -  Net 618.62 ml   Filed Weights   10/28/17 2101  Weight: 87.8 kg (193 lb 9 oz)    Examination:  General exam: Appears calm and comfortable  Respiratory system: Clear to auscultation. Respiratory effort normal. Cardiovascular system: S1 & S2 heard, RRR. No JVD. No pedal edema. Gastrointestinal system: Abdomen is nondistended, soft and nontender. No organomegaly or masses felt. Normal bowel sounds heard. Central nervous system:  Alert and oriented. No focal neurological deficits. Extremities: Symmetric 5 x 5 power. Skin: No rashes, lesions or ulcers Psychiatry:Mood & affect appropriate.     Data Reviewed: I have personally reviewed following labs and imaging studies  CBC: Recent Labs  Lab 10/28/17 1428 10/29/17 0824  WBC 9.5 8.2  NEUTROABS 7.4  --   HGB 11.9* 11.2*  HCT 37.6* 36.3*  MCV 102.5* 102.5*  PLT 200 161   Basic Metabolic Panel: Recent Labs  Lab 10/28/17 1428 10/29/17 0824  NA 141 141  K 3.4* 4.0  CL  96* 97*  CO2 32 31  GLUCOSE 143* 139*  BUN 16 24*  CREATININE 3.53* 5.61*  CALCIUM 8.7* 8.9   GFR: Estimated Creatinine Clearance: 12.3 mL/min (A) (by C-G formula based on SCr of 5.61 mg/dL (H)). Liver Function Tests: Recent Labs  Lab 10/28/17 1428  AST 23  ALT 24  ALKPHOS 68  BILITOT 0.5  PROT 7.0  ALBUMIN 3.5   No results for input(s): LIPASE, AMYLASE in the last 168 hours. Recent Labs  Lab 10/28/17 1428  AMMONIA 41*   Coagulation Profile: Recent Labs  Lab 10/26/17 1306 10/28/17 1428 10/29/17 0824  INR 1.6* 2.35 2.00   Cardiac Enzymes: No results for input(s): CKTOTAL, CKMB, CKMBINDEX, TROPONINI in the last 168 hours. BNP (last 3 results) Recent Labs    11/11/16 1143  PROBNP 18,028*   HbA1C: No results for input(s): HGBA1C in the last 72 hours. CBG: No results for input(s): GLUCAP in the last 168 hours. Lipid Profile: No results for input(s): CHOL, HDL, LDLCALC, TRIG, CHOLHDL, LDLDIRECT in the last 72 hours. Thyroid Function Tests: No results for input(s): TSH, T4TOTAL, FREET4, T3FREE, THYROIDAB in the last 72 hours. Anemia Panel: No results for input(s): VITAMINB12, FOLATE, FERRITIN, TIBC, IRON, RETICCTPCT in the last 72 hours. Sepsis Labs: Recent Labs  Lab 10/28/17 1435 10/28/17 1710 10/29/17 0824  LATICACIDVEN 2.02* 1.19 1.6    Recent Results (from the past 240 hour(s))  MRSA PCR Screening     Status: None   Collection Time: 10/29/17  6:05 AM  Result Value Ref Range Status   MRSA by PCR NEGATIVE NEGATIVE Final    Comment:        The GeneXpert MRSA Assay (FDA approved for NASAL specimens only), is one component of a comprehensive MRSA colonization surveillance program. It is not intended to diagnose MRSA infection nor to guide or monitor treatment for MRSA infections. Performed at [facility], [place] [address], [place], [place] [address]          Radiology Studies: Dg Chest [facility] 1 View  Result Date: 10/28/2017 CLINICAL  DATA:  Two-month history of generalized weakness and hematuria. EXAM: PORTABLE CHEST 1 VIEW COMPARISON:  05/12/2017, 04/28/2017 and earlier, including CT chest 01/04/2018 and earlier. FINDINGS: Cardiac silhouette moderately enlarged, unchanged. Prior TAVR. RIGHT subclavian dual lead transvenous pacemaker unchanged and appears intact. Thoracic aorta tortuous and atherosclerotic, unchanged. Hilar and mediastinal contours otherwise unremarkable. Mild pulmonary venous hypertension without overt edema, unchanged. Mild atelectasis involving the lung bases, LEFT greater than RIGHT. Lungs otherwise clear. No confluent airspace consolidation. No visible pleural effusions. IMPRESSION: 1. Mild bibasilar atelectasis, LEFT greater than RIGHT. No acute cardiopulmonary disease otherwise. 2. Stable moderate cardiomegaly without evidence of pulmonary edema. Electronically Signed   By: Evangeline Dakin M.D.   On: 10/28/2017 15:03        Scheduled Meds: . [START ON 10/31/2017] bromocriptine  5 mg Oral Q M,W,F  . cinacalcet  30 mg Oral BID WC  . digoxin  62.5 mcg Oral Daily  . diltiazem  120 mg Oral Daily  . dorzolamide-timolol  1 drop Both Eyes BID  . pantoprazole  40 mg Oral Daily  . polyethylene glycol  17 g Oral Daily  . pravastatin  10 mg Oral Daily  . predniSONE  5 mg Oral QHS  . sertraline  50 mg Oral Daily  . sevelamer carbonate  800 mg Oral TID WC  . vancomycin variable dose per unstable renal function (pharmacist dosing)   Does not apply See admin instructions  . warfarin  5 mg Oral ONCE-1800  . Warfarin - Pharmacist Dosing Inpatient   Does not apply q1800   Continuous Infusions: . sodium chloride Stopped (10/29/17 0559)  . [START ON 10/31/2017] cefTAZidime (FORTAZ)  IV       LOS: 1 day    Time spent: 35 minutes.     Hosie Poisson, MD [facility] Pager [phone]  If 7PM-7AM, please contact night-coverage [email or web address] Password [email or web address] 10/29/2017, 11:35 AM

## 2017-10-29 NOTE — Evaluation (Signed)
Physical Therapy Evaluation Patient Details Name: Alexander [name] MRN: [id number] DOB: [birth year] Today's Date: 10/29/2017   History of Present Illness  Pt is a 77 y.o. M with significant PMH of ESRD on dialysis M/W/F, history of a stroke, status post TAVR, permanent atrial fibrillation on pacemaker, who is admitted with recurrent urinary tract infection.  Clinical Impression  Patient evaluated by Physical Therapy with no further acute PT needs identified. Patient appears close to baseline with functional mobility. Ambulating hallway distances with walker modified independently and demonstrated adequate gait speed; no unsteadiness noted. Patient and patient family provided activity recommendations and encouraged patient to ambulate in hallway with daughters present during inpatient stay. All education has been completed and the patient has no further questions. No follow-up Physical Therapy or equipment needs. PT is signing off. Thank you for this referral.     Follow Up Recommendations No PT follow up    Equipment Recommendations  None recommended by PT    Recommendations for Other Services       Precautions / Restrictions Precautions Precautions: Fall Restrictions Weight Bearing Restrictions: No      Mobility  Bed Mobility Overal bed mobility: Independent                Transfers Overall transfer level: Modified independent Equipment used: Rolling walker (2 wheeled)                Ambulation/Gait Ambulation/Gait assistance: Modified independent (Device/Increase time) Gait Distance (Feet): 380 Feet Assistive device: Rolling walker (2 wheeled) Gait Pattern/deviations: Step-through pattern;Trunk flexed;Decreased step length - right     General Gait Details: Patient overall with adequate gait speed but poor proximity to walker (used to ambulating with Rollator).   Stairs            Wheelchair Mobility    Modified Rankin (Stroke Patients Only)        Balance Overall balance assessment: No apparent balance deficits (not formally assessed)(Able to maintain static standing independently)                                           Pertinent Vitals/Pain Pain Assessment: No/denies pain    Home Living Family/patient expects to be discharged to:: Private residence Living Arrangements: Spouse/significant other Available Help at Discharge: Family Type of Home: House Home Access: Level entry     Home Layout: One level Home Equipment: Environmental consultant - 4 wheels;Shower seat      Prior Function Level of Independence: Needs assistance   Gait / Transfers Assistance Needed: amb with Rollator. Independent with household ambulation but needs assistance for community ambulation secondary to poor vision  ADL's / Homemaking Assistance Needed: independent        Hand Dominance        Extremity/Trunk Assessment   Upper Extremity Assessment Upper Extremity Assessment: Overall WFL for tasks assessed    Lower Extremity Assessment Lower Extremity Assessment: Overall WFL for tasks assessed    Cervical / Trunk Assessment Cervical / Trunk Assessment: Kyphotic  Communication   Communication: No difficulties  Cognition Arousal/Alertness: Awake/alert Behavior During Therapy: WFL for tasks assessed/performed Overall Cognitive Status: Within Functional Limits for tasks assessed  General Comments General comments (skin integrity, edema, etc.): Pt daughters present    Exercises     Assessment/Plan    PT Assessment Patent does not need any further PT services  PT Problem List         PT Treatment Interventions      PT Goals (Current goals can be found in the Care Plan section)  Acute Rehab PT Goals Patient Stated Goal: not feel as tired PT Goal Formulation: All assessment and education complete, DC therapy    Frequency     Barriers to discharge         Co-evaluation               AM-PAC PT "6 Clicks" Daily Activity  Outcome Measure Difficulty turning over in bed (including adjusting bedclothes, sheets and blankets)?: None Difficulty moving from lying on back to sitting on the side of the bed? : None Difficulty sitting down on and standing up from a chair with arms (e.g., wheelchair, bedside commode, etc,.)?: None Help needed moving to and from a bed to chair (including a wheelchair)?: None Help needed walking in hospital room?: None Help needed climbing 3-5 steps with a railing? : A Little 6 Click Score: 23    End of Session Equipment Utilized During Treatment: Gait belt Activity Tolerance: Patient tolerated treatment well Patient left: in chair;with call bell/phone within reach;with family/visitor present Nurse Communication: Mobility status PT Visit Diagnosis: Unsteadiness on feet (R26.81);Muscle weakness (generalized) (M62.81);Difficulty in walking, not elsewhere classified (R26.2)    Time: 8101-7510 PT Time Calculation (min) (ACUTE ONLY): 20 min   Charges:   PT Evaluation $PT Eval Low Complexity: 1 Low         Ellamae Sia, PT, DPT Acute Rehabilitation Services  Pager: [phone]   Willy Eddy 10/29/2017, 9:26 AM

## 2017-10-30 ENCOUNTER — Inpatient Hospital Stay (HOSPITAL_COMMUNITY): Payer: Medicare Other

## 2017-10-30 ENCOUNTER — Encounter (HOSPITAL_COMMUNITY): Payer: Self-pay | Admitting: Infectious Disease

## 2017-10-30 DIAGNOSIS — D649 Anemia, unspecified: Secondary | ICD-10-CM

## 2017-10-30 DIAGNOSIS — R627 Adult failure to thrive: Secondary | ICD-10-CM

## 2017-10-30 HISTORY — DX: Adult failure to thrive: R62.7

## 2017-10-30 LAB — ECHOCARDIOGRAM COMPLETE
HEIGHTINCHES: 70 in
Weight: 3097.02 oz

## 2017-10-30 LAB — HIV ANTIBODY (ROUTINE TESTING W REFLEX): HIV SCREEN 4TH GENERATION: NONREACTIVE

## 2017-10-30 LAB — URINE CULTURE: Culture: 80000 — AB

## 2017-10-30 LAB — SEDIMENTATION RATE: Sed Rate: 44 mm/hr — ABNORMAL HIGH (ref 0–16)

## 2017-10-30 LAB — C-REACTIVE PROTEIN: CRP: 1.9 mg/dL — ABNORMAL HIGH (ref <1.0)

## 2017-10-30 LAB — PROTIME-INR
INR: 2.3
Prothrombin Time: 25.1 seconds — ABNORMAL HIGH (ref 11.4–15.2)

## 2017-10-30 MED ORDER — SEVELAMER CARBONATE 800 MG PO TABS
1600.0000 mg | ORAL_TABLET | Freq: Three times a day (TID) | ORAL | Status: DC
  Administered 2017-10-30 – 2017-11-02 (×6): 1600 mg via ORAL
  Filled 2017-10-30 (×6): qty 2

## 2017-10-30 MED ORDER — FLUCONAZOLE 100 MG PO TABS: 100.0000 mg | ORAL_TABLET | Freq: Every day | ORAL | Status: DC

## 2017-10-30 MED ORDER — NEPRO/CARBSTEADY PO LIQD
237.0000 mL | Freq: Two times a day (BID) | ORAL | Status: DC
  Administered 2017-10-30 – 2017-11-01 (×4): 237 mL via ORAL

## 2017-10-30 MED ORDER — WARFARIN SODIUM 5 MG PO TABS: 5.0000 mg | ORAL_TABLET | Freq: Once | ORAL | Status: DC

## 2017-10-30 MED ORDER — RENA-VITE PO TABS
1.0000 | ORAL_TABLET | Freq: Every day | ORAL | Status: DC
  Administered 2017-10-30 – 2017-11-01 (×3): 1 via ORAL
  Filled 2017-10-30 (×3): qty 1

## 2017-10-30 MED ORDER — CHLORHEXIDINE GLUCONATE CLOTH 2 % EX PADS
6.0000 | MEDICATED_PAD | Freq: Every day | CUTANEOUS | Status: DC
  Administered 2017-10-31 – 2017-11-01 (×2): 6 via TOPICAL

## 2017-10-30 MED ORDER — FOSFOMYCIN TROMETHAMINE 3 G PO PACK
3.0000 g | PACK | Freq: Once | ORAL | Status: AC
  Administered 2017-10-30: 3 g via ORAL
  Filled 2017-10-30: qty 3

## 2017-10-30 MED ORDER — DOXERCALCIFEROL 4 MCG/2ML IV SOLN
3.0000 ug | INTRAVENOUS | Status: DC
  Administered 2017-11-02: 3 ug via INTRAVENOUS
  Filled 2017-10-30 (×2): qty 2

## 2017-10-30 MED ORDER — FLUCONAZOLE 100 MG PO TABS
200.0000 mg | ORAL_TABLET | Freq: Every day | ORAL | Status: DC
  Administered 2017-10-30: 200 mg via ORAL
  Filled 2017-10-30: qty 2

## 2017-10-30 MED ORDER — CINACALCET HCL 30 MG PO TABS
60.0000 mg | ORAL_TABLET | Freq: Two times a day (BID) | ORAL | Status: DC
  Administered 2017-10-30 – 2017-11-02 (×5): 60 mg via ORAL
  Filled 2017-10-30 (×8): qty 2

## 2017-10-30 MED ORDER — WARFARIN SODIUM 4 MG PO TABS
4.0000 mg | ORAL_TABLET | Freq: Once | ORAL | Status: AC
  Administered 2017-10-30: 4 mg via ORAL
  Filled 2017-10-30: qty 1

## 2017-10-30 NOTE — Telephone Encounter (Signed)
Late Entry - 8.2.19 @ 10:45-   [name] daughter [name] called back to report that her dad is increasingly more lethargic and with poor appetite despite antibiotic and still with bright red blood in urine. They stopped dialysis early also today due to this.   Via Sharyn Lull, RN (who took the phone call) I asked her to please take her father to the emergency room for further evaluation; I think we should consider alternative causes to explain his worsening symptoms. He appeared actually fairly well at our office visit last week and not lethargic at all so hearing of this progressive change is worrisome. I am concerned that he may have endocarditis (hx of TAVR) as his colony count was not impressive on last urine cx at urologist's office vs a malignant process involving the bladder with ongoing hematuria.   Further work up with hospital evaluation.   Janene Madeira, NP

## 2017-10-30 NOTE — Consult Note (Addendum)
[facility] Renal Consultation Note    Indication for Consultation:  Management of ESRD/hemodialysis; anemia, hypertension/volume and secondary hyperparathyroidism PCP: Dr. Audie Pinto  HPI: Alexander [name] is a 77 y.o. male with ESRD on hemodialysis MWF at [facility]. PMH significant for HTN, DM, aortic stenosis S/P TAVR 4627, diastolic HF 03-50% grade 1 diastolic dysfunction, PPM, mild dementia, PPM Afib on coumadin.  Last HD 10/28/2017 ran full treatment, left 0.1 under OP EDW 87 kgs. Patient is an excellent hemodialysis patient, compliant with HD prescription.    Patient presented to ED 10/28/2017 after being seen by Infectious disease for recurrent UTIs with report of urine culture done which revealed resistant. Enterococcus faelcalis . Patient was referred from ID office to ED. Patient has been having recurrent UTIs since late June/early July which have been refractory to multiple antibiotics.  Upon arrival to ED, patient was afebrile T 98 BP 114/63 HR 64. WBC 9.5 Lactic acid 2.02. Labs were otherwise unremarkable. BC and urine culture was obtained. He was started vancomycin and ceftazidime and admitted for work up of complex UTI.    Patient seen with daughter at bedside. Patient endorses mild confusion (My brain just gets twisted side ways"!) dysuria, hematuria, generalized weakness and fatigue. Daughter says he has "just not been himself". He denies any other issues.  Has had a UTI s/p failed tx times 3- felt needed admit for IV abx- most recent urine culture showing pretty sens e coli- question of needing antifungals per ID.  Other sxms vague  Past Medical History:  Diagnosis Date  . Anemia   . Aortic stenosis 06/15/12   TEE - EF 09-38%; grade 1 diastolic dysfunction; mild/mod aortic valve stenosis; Mitral valve had calcified annulus, mild pulm htn PA peak pressure 37mmHg  . Barrett's esophagus 05/2003  . Bradycardia 2017   [employer] 2240  Assurity dual-lead pacemaker  . Carpal tunnel syndrome, bilateral 11/03/2015  . Colon polyps   . CVA (cerebral infarction)    2004/affected left side  . Depression   . Diabetes mellitus without complication ([place])   . Diabetic peripheral neuropathy ([place]) 10/02/2015  . Diverticulosis   . Dyspnea    with exertion  . ESRD (end stage renal disease) on dialysis ([place])    "[facility]; [place]; MWF" (05/12/2017)  . GERD (gastroesophageal reflux disease)   . Glaucoma   . History of kidney stones   . Hyperlipidemia   . Hypertension   . Legally blind   . Macular degeneration    both eyes  . Orthostatic hypotension 09/09/2015  . Paroxysmal atrial fibrillation (HCC)   . Peptic ulcer    bleeding, 1969  . Presence of permanent cardiac pacemaker   . S/P epidural steroid injection    last  injection over 10 years ago  . Seasonal allergies   . Tubular adenoma of colon 07/2001   Past Surgical History:  Procedure Laterality Date  . AV FISTULA PLACEMENT  2009  . BACK SURGERY    . CARDIOVERSION N/A 11/13/2015   Procedure: CARDIOVERSION;  Surgeon: Troy Sine, MD;  Location[facility] ENDOSCOPY;  Service: Cardiovascular;  Laterality: N/A;  . CARDIOVERSION N/A 01/13/2016   Procedure: CARDIOVERSION;  Surgeon: Will Meredith [name], MD;  Location: [place];  Service: Cardiovascular;  Laterality: N/A;  . [place]   right eye  . CYSTOSCOPY  several times   kidney stones  . EP IMPLANTABLE DEVICE N/A 03/11/2015   Procedure: Pacemaker Implant;  Surgeon: Will  Meredith [name], MD;  [place] 8786 Assurity;  Laterality: Left  . LAMINECTOMY  1969  . RIGHT/LEFT HEART CATH AND CORONARY ANGIOGRAPHY N/A 08/20/2016   Procedure: Right/Left Heart Cath and Coronary Angiography;  Surgeon: Burnell Blanks, MD;  Location: [place] CV LAB;  Service: Cardiovascular;  Laterality: N/A;  . TEE WITHOUT CARDIOVERSION N/A 07/22/2016   Procedure: TRANSESOPHAGEAL ECHOCARDIOGRAM (TEE);  Surgeon: Skeet Latch, MD;  Location: [place];  Service: Cardiovascular;  Laterality: N/A;  . TEE WITHOUT CARDIOVERSION N/A 08/31/2016   Procedure: TRANSESOPHAGEAL ECHOCARDIOGRAM (TEE);  Surgeon: Burnell Blanks, MD;  Location: [place];  Service: Open Heart Surgery;  Laterality: N/A;  . TEE WITHOUT CARDIOVERSION N/A 12/01/2016   Procedure: TRANSESOPHAGEAL ECHOCARDIOGRAM (TEE);  Surgeon: Larey Dresser, MD;  Location[facility] ENDOSCOPY;  Service: Cardiovascular;  Laterality: N/A;  . TONSILLECTOMY  1964  . TRANSCATHETER AORTIC VALVE REPLACEMENT, TRANSFEMORAL N/A 08/31/2016   Procedure: TRANSCATHETER AORTIC VALVE REPLACEMENT, TRANSFEMORAL;  Surgeon: Burnell Blanks, MD;  Location: [facility];  Service: Open Heart Surgery;  Laterality: N/A;   Family History  Problem Relation Age of Onset  . Stomach cancer Mother   . Hypertension Father        Died of heart attack  . Heart attack Father   . Stroke Sister   . Heart disease Sister   . Cancer Brother   . Colon cancer Neg Hx    Social History:  reports that he quit smoking about 19 years ago. His smoking use included cigarettes. He has a 90.00 pack-year smoking history. He has never used smokeless tobacco. He reports that he does not drink alcohol or use drugs. Allergies  Allergen Reactions  . Penicillins Swelling and Rash    Has patient had a PCN reaction causing immediate rash, facial/tongue/throat swelling, SOB or lightheadedness with hypotension: Yes Has patient had a PCN reaction causing severe rash involving mucus membranes or skin necrosis: No Has patient had a PCN reaction that required hospitalization: No Has patient had a PCN reaction occurring within the last 10 years: No If all of the above answers are "NO", then may proceed with Cephalosporin use.   . Atorvastatin     MYALGIA   . Codeine Nausea Only  . Tramadol Nausea Only   Prior to Admission medications   Medication Sig Start Date End Date Taking? Authorizing Provider   acetaminophen (TYLENOL) 325 MG tablet Take 975 mg by mouth daily as needed for headache.    Yes [provider]  b complex-vitamin c-folic acid (NEPHRO-VITE) 0.8 MG TABS Take 1 tablet by mouth daily.    Yes [provider]  bromocriptine (PARLODEL) 5 MG capsule Take 5 mg by mouth See admin instructions. Monday, Wednesday and Friday only 11/30/10  Yes [provider]  cetirizine (ZYRTEC) 10 MG tablet Take 10 mg by mouth at bedtime.    Yes [provider]  cinacalcet (SENSIPAR) 30 MG tablet Take 30 mg by mouth 2 (two) times daily with a meal.    Yes [provider]  [place] 125 MCG tablet TAKE 1/2 TABLET BY MOUTH EVERY DAY 09/08/17  Yes Skeet Latch, MD  diltiazem (CARDIZEM CD) 120 MG 24 hr capsule Take 1 capsule (120 mg total) by mouth daily. 06/21/17 10/28/17 Yes Camnitz, Will Hassell Done, MD  dorzolamide-timolol (COSOPT) 22.3-6.8 MG/ML ophthalmic solution Place 1 drop into both eyes 2 (two) times daily.  08/13/13  Yes [provider]  midodrine (PROAMATINE) 5 MG tablet TAKE 3 TABLETS BY MOUTH  THREE TIMES DAILY AS NEEDED. 07/06/17  Yes Skeet Latch, MD  Nutritional Supplements [facility]) LIQD Take 237 mLs by mouth every Monday, Wednesday, and Friday with hemodialysis.    Yes [provider]  omeprazole (PRILOSEC) 20 MG capsule Take 20 mg by mouth daily.   Yes [provider]  pravastatin (PRAVACHOL) 10 MG tablet Take 1 tablet (10 mg total) by mouth daily. 08/08/17  Yes Skeet Latch, MD  predniSONE (DELTASONE) 5 MG tablet Take 5 mg by mouth at bedtime.  04/27/16  Yes [provider]  sertraline (ZOLOFT) 50 MG tablet Take 50 mg by mouth daily. 11/18/16  Yes [provider]  sevelamer carbonate (RENVELA) 800 MG tablet Take 800 mg by mouth 3 (three) times daily with meals.   Yes [provider]  Tiotropium Bromide-Olodaterol (STIOLTO RESPIMAT) 2.5-2.5 MCG/ACT AERS Inhale 2 puffs into the lungs daily. 07/26/17   Yes Mannam, Praveen, MD  warfarin (COUMADIN) 5 MG tablet Take 1-1.5 tablets by mouth daily as directed by coumadin clinic Patient taking differently: Take 2.5-5 mg by mouth See admin instructions. 5 mg by mouth at night on Sun/Tues/Thurs/Sat and 2.5 mg on Mon/Wed/Fri 03/08/16  Yes Skeet Latch, MD  clindamycin (CLEOCIN) 300 MG capsule Take 2 tablets by mouth one hour prior to dental appointments Patient not taking: Reported on 10/28/2017 08/25/17   Eileen Stanford, PA-C  polyethylene glycol (MIRALAX / GLYCOLAX) packet Take 17 g by mouth daily. Patient not taking: Reported on 10/28/2017 12/02/16   Florencia Reasons, MD   Current Facility-Administered Medications  Medication Dose Route Frequency Provider Last Rate Last Dose  . 0.9 %  sodium chloride infusion   Intravenous PRN Shelly Coss, MD   Stopped at 10/29/17 0559  . acetaminophen (TYLENOL) tablet 975 mg  975 mg Oral Daily PRN Shelly Coss, MD   975 mg at 10/29/17 1659  . [START ON 10/31/2017] bromocriptine (PARLODEL) tablet 5 mg  5 mg Oral Q M,W,F Adhikari, Amrit, MD      . Derrill Memo ON 10/31/2017] cefTAZidime (FORTAZ) 2 g in sodium chloride 0.9 % 100 mL IVPB  2 g Intravenous Q M,W,F-HD Pham, Anh P, RPH      . cinacalcet (SENSIPAR) tablet 30 mg  30 mg Oral BID WC Shelly Coss, MD   30 mg at 10/30/17 0922  . digoxin (LANOXIN) tablet 62.5 mcg  62.5 mcg Oral Daily Shelly Coss, MD   62.5 mcg at 10/30/17 2831  . diltiazem (CARDIZEM CD) 24 hr capsule 120 mg  120 mg Oral Daily Shelly Coss, MD   120 mg at 10/30/17 0921  . dorzolamide-timolol (COSOPT) 22.3-6.8 MG/ML ophthalmic solution 1 drop  1 drop Both Eyes BID Shelly Coss, MD   1 drop at 10/30/17 0923  . pantoprazole (PROTONIX) EC tablet 40 mg  40 mg Oral Daily Shelly Coss, MD   40 mg at 10/30/17 0921  . polyethylene glycol (MIRALAX / GLYCOLAX) packet 17 g  17 g Oral Daily Shelly Coss, MD   17 g at 10/30/17 5176  . pravastatin (PRAVACHOL) tablet 10 mg  10 mg Oral Daily Shelly Coss, MD   10 mg at 10/30/17 0921  . predniSONE (DELTASONE) tablet 5 mg  5 mg Oral QHS Shelly Coss, MD   5 mg at 10/29/17 2139  . sertraline (ZOLOFT) tablet 50 mg  50 mg Oral Daily Shelly Coss, MD   50 mg at 10/30/17 0921  . sevelamer carbonate (RENVELA) tablet 800 mg  800 mg Oral TID WC Adhikari, Amrit,  MD   800 mg at 10/30/17 0921  . warfarin (COUMADIN) tablet 5 mg  5 mg Oral ONCE-1800 Pierce, Dwayne A, RPH      . Warfarin - Pharmacist Dosing Inpatient   Does not apply q1800 Lynelle Doctor, [facility]       Labs: Basic Metabolic Panel: Recent Labs  Lab 10/28/17 1428 10/29/17 0824  NA 141 141  K 3.4* 4.0  CL 96* 97*  CO2 32 31  GLUCOSE 143* 139*  BUN 16 24*  CREATININE 3.53* 5.61*  CALCIUM 8.7* 8.9   Liver Function Tests: Recent Labs  Lab 10/28/17 1428  AST 23  ALT 24  ALKPHOS 68  BILITOT 0.5  PROT 7.0  ALBUMIN 3.5   No results for input(s): LIPASE, AMYLASE in the last 168 hours. Recent Labs  Lab 10/28/17 1428  AMMONIA 41*   CBC: Recent Labs  Lab 10/28/17 1428 10/29/17 0824  WBC 9.5 8.2  NEUTROABS 7.4  --   HGB 11.9* 11.2*  HCT 37.6* 36.3*  MCV 102.5* 102.5*  PLT 200 172   Cardiac Enzymes: No results for input(s): CKTOTAL, CKMB, CKMBINDEX, TROPONINI in the last 168 hours. CBG: No results for input(s): GLUCAP in the last 168 hours. Iron Studies: No results for input(s): IRON, TIBC, TRANSFERRIN, FERRITIN in the last 72 hours. Studies/Results: Dg Chest Port 1 View  Result Date: 10/28/2017 CLINICAL DATA:  Two-month history of generalized weakness and hematuria. EXAM: PORTABLE CHEST 1 VIEW COMPARISON:  05/12/2017, 04/28/2017 and earlier, including CT chest 01/04/2018 and earlier. FINDINGS: Cardiac silhouette moderately enlarged, unchanged. Prior TAVR. RIGHT subclavian dual lead transvenous pacemaker unchanged and appears intact. Thoracic aorta tortuous and atherosclerotic, unchanged. Hilar and mediastinal contours otherwise unremarkable. Mild pulmonary venous  hypertension without overt edema, unchanged. Mild atelectasis involving the lung bases, LEFT greater than RIGHT. Lungs otherwise clear. No confluent airspace consolidation. No visible pleural effusions. IMPRESSION: 1. Mild bibasilar atelectasis, LEFT greater than RIGHT. No acute cardiopulmonary disease otherwise. 2. Stable moderate cardiomegaly without evidence of pulmonary edema. Electronically Signed   By: Evangeline Dakin M.D.   On: 10/28/2017 15:03    ROS: As per HPI otherwise negative.    Physical Exam: Vitals:   10/29/17 1009 10/29/17 1408 10/29/17 2145 10/30/17 0518  BP: 126/64 (!) 158/66 (!) 156/71 (!) 154/70  Pulse: 65 62 62 63  Resp:  18 17 17   Temp:  97.9 F (36.6 C) 98.1 F (36.7 C) 97.9 F (36.6 C)  TempSrc:  Oral Oral Oral  SpO2:  92% 95% 98%  Weight:      Height:         General: Very pleasant elderly male in NAD Head: Normocephalic, atraumatic, sclera non-icteric, mucus membranes are moist Neck: Supple. JVD not elevated. Lungs: Clear bilaterally to auscultation without wheezes, rales, or rhonchi. Breathing is unlabored. Heart: Regularly irregular with S1 S2. No murmurs, rubs, or gallops appreciated. Abdomen: Soft, non-tender, non-distended with normoactive bowel sounds. No rebound/guarding. No obvious abdominal masses. M-S:  Strength and tone appear normal for age. Lower extremities:without edema or ischemic changes, no open wounds  Neuro: Alert and oriented X 3. Moves all extremities spontaneously. Psych:  Responds to questions appropriately with a normal affect. Dialysis Access: LFA AVF + bruit.   Dialysis Orders: [place] MWF 4 hrs 160 NRe 450/Autoflow 1.5 87 kg 2.0 K/2.0 Ca Linear Na L AVF -Heparin 2800 units IV TIW -Hectorol 3 mcg IV TIW -Mircera 200 mcg IV q 2 weeks (last dose 10/26/17 Last HGB 11 10/26/17)  BMD meds: Sensipar 60 mg PO daily Renvela 800 mg 2 tabs PO TID AC  Assessment/Plan: 1.  UTI vs Balanitis-per primary-ID following. Currently on  Ceftazidime.Urine culture positive for Ecoli 80000 C/MLs. BC NG 24 hrs.   2.  ESRD -  MWF via AVF. HD tomorrow on schedule.  3.  Hypertension/volume  - No evidence of volume overload. BP controlled. Wt 87.8 kg-just above OP EDW. 1-1.5 liters UFG with HD tomorrow.  Patient currently on Cardizem 120 mg q 24. Metoprolol and midodrine on OP med list. These have not been resumed.  4.  Anemia  - HGB 11.2. No ESA needed.  5.  Metabolic bone disease -  Continue binders, sensipar and VDRA. Check renal function with HD tomorrow.  6.  Nutrition - Renal diet, renal vit, nepro 7.  H/O TAVR 2018 8.  H/O Streptococcus gallyolyticus bacteremia in setting TAVR/PPM 9.  Afib-on coumadin. Rate controlled with cardizem.    Rita H. Owens Shark, NP-C 10/30/2017, 1:08 PM  [name] [phone]  Patient seen and examined, agree with above note with above modifications. Pt well known to me- complicated history of UTI that has not cleared so was admitted for IV abx.  Interestingly most recent culture was pan sens E coli.  Plan per ID- pt denies other systemic sxms.  WIll plan for routine HD tomorrow on schedule Corliss Parish, MD 10/30/2017

## 2017-10-30 NOTE — Progress Notes (Signed)
  Echocardiogram 2D Echocardiogram has been performed.  Alexander [name] 10/30/2017, 2:49 PM

## 2017-10-30 NOTE — Progress Notes (Addendum)
[place] for warfarin Indication: hx atrial fibrillation  Allergies  Allergen Reactions  . Penicillins Swelling and Rash    Has patient had a PCN reaction causing immediate rash, facial/tongue/throat swelling, SOB or lightheadedness with hypotension: Yes Has patient had a PCN reaction causing severe rash involving mucus membranes or skin necrosis: No Has patient had a PCN reaction that required hospitalization: No Has patient had a PCN reaction occurring within the last 10 years: No If all of the above answers are "NO", then may proceed with Cephalosporin use.   . Atorvastatin     MYALGIA   . Codeine Nausea Only  . Tramadol Nausea Only    Patient Measurements: Height: 5\' 10"  (177.8 cm) Weight: 193 lb 9 oz (87.8 kg) IBW/kg (Calculated) : 73 Heparin Dosing Weight:   Vital Signs: Temp: 97.9 F (36.6 C) (08/04 0518) Temp Source: Oral (08/04 0518) BP: 154/70 (08/04 0518) Pulse Rate: 63 (08/04 0518)  Labs: Recent Labs    10/28/17 1428 10/29/17 0824 10/30/17 0416  HGB 11.9* 11.2*  --   HCT 37.6* 36.3*  --   PLT 200 172  --   LABPROT 25.5* 22.5* 25.1*  INR 2.35 2.00 2.30  CREATININE 3.53* 5.61*  --     Estimated Creatinine Clearance: 12.3 mL/min (A) (by C-G formula based on SCr of 5.61 mg/dL (H)).   Medications:  Per outpt AC clinic note on 7/31--> INR 1.6, dose increased to 7.5 mg on 7/31 and then resume 5 mg daily except 2.5 mg on MWF (last dose taken on 8/1 per daughter)  Assessment: Alexander [name] is a 77 y.o. male with ESRD on HD MWF, hematuria, and afib on warfarin PTA presented to the ED on 10/28/17 with c/o lethargy and no appetite.  Pt's daughter reported that he saw his urologist Dr. Pilar Jarvis about two weeks ago for workup of hematuria.  Dr. Pilar Jarvis suspected that hematuria was secondary to UTI and referred pt to the [facility] for treatment of infection.  To resume warfarin inpatient.  - INR is therapeutic at 2.3, on abx for UTI  per ID which may   Goal of Therapy:  INR 2-3 Monitor platelets by anticoagulation protocol: Yes   Plan:  - warfarin 5 mg PO x1 (home dose) - daily INR - monitor for s/s bleeding especially for severity of hematuria   Mayrin Schmuck A. Levada Dy, PharmD, [place] Pager: [phone] Please utilize Amion for appropriate phone number to reach the unit pharmacist ([place])  Addendum:   Patient to start fluconazole per ID for balanitis. This may significantly increase the INR with warfarin. Will empirically decrease warfarin by 20% tonight to 4mg .   Racer Quam A. Levada Dy, PharmD, [place] Pager: [phone] Please utilize Amion for appropriate phone number to reach the unit pharmacist ([place])     10/30/2017,11:24 AM

## 2017-10-30 NOTE — Care Management CC44 (Signed)
Condition Code 44 Documentation Completed  Patient Details  Name: Alexander [name] MRN: [id number] Date of Birth: [birth year]   Condition Code 44 given:  Yes Patient signature on Condition Code 44 notice:  Yes Documentation of 2 MD's agreement:  Yes Code 44 added to claim:  Yes    Carles Collet, RN 10/30/2017, 10:36 AM

## 2017-10-30 NOTE — Progress Notes (Signed)
PROGRESS NOTE    Alexander [name]  XHB:[id number] DOB: June 27, 1940 DOA: 10/28/2017 PCP: Deland Pretty, MD    Brief Narrative:  Alexander [name] is a 77 y.o. male with medical history significant of ESRD on dialysis on M/W/F, history of a stroke, status post TAVR, permanent atrial fibrillation on pacemaker, on Coumadin, recurrent urinary tract infection who was sent by ID from the office to the emergency department for admission. As per the ID, patient urine culture grew Enterococcus faecalis which was resistant to most of the antibiotics. Since last 4 weeks, patient was having dysuria, burning sensation and hematuria.  Though he is on dialysis, he produces little bit of urine and he has been having increased frequency of urination. Patient will be admitted to [facility] for more work-up and IV antibiotics and possible dialysis on Monday. He was started on IV vancomycin and ceftazidime, and urine cultures sent.     Assessment & Plan:   Principal Problem:   Failure to thrive syndrome, adult Active Problems:   Anemia, unspecified   ESRD (end stage renal disease) on dialysis (HCC)   Hyperlipidemia   Persistent atrial fibrillation (HCC)   Long term (current) use of anticoagulants [Z79.01]   UTI (urinary tract infection)   Hematuria   Urinary tract infection: outpatient urine cultures growing drug resistant Enterococcus fecalis. Pt currently on IV vancomycin and ceftazidime and repeat cultures show 80,000 Ecoli, pan sensitive. The ceftazidime was discontinued and one dose of fosfomycin ordered by ID. Diflucan was also started for possible fungal balanitis. .  Pt denies any abdominal pain or flank pain, but continues to report some dysuria.  He has been afebrile and there is no leukocytosis.     ESRD on HD.  Nephrology aware of the pts HD on MWF. Plan for HD tomorrow.     h/o Streptococcal bovis bacteremia: TTE ordered by ID for evaluation of endocarditis.    ? Confusion: Pt is alert and  oriented today, pt has a h/o stroke more than 10 year ago. He has a pacemaker placed 3 years ago.  Will check with MRI if the pacemaker is compatible with the MRI and order it.  Outpatient follow u with neurology.    Failure to thrive: TTE ordered by ID to evaluate for endocarditis.  CT abd and pelvis ordered to evaluate for malignancy.   Hematuria;  Outpatient follow up with urology.    Atrial fibrillation, chronic On dig and cardizem.  On coumadin , .  INR therapeutic.   Hyperlipidemia: Resume statin.   Anemia of chronic disease:  Secondary to ESRD.    Hypokalemia: replaced.   Hyperlipidemia: Resume provachol.    Chronic osteoarthritis: Resume prednisone.        DVT prophylaxis:coumadin.  Code Status: full code.  Family Communication: daughters at bedside, discussed the plan of care with the patient and family.  Disposition Plan: possible d.c tomorrow after the echo and CT abd and pelvis.   Consultants:   Nephrology for HD.   Procedures:NONE.   Antimicrobials:  Vancomycin and ceftazidime since admission.   Subjective: No complaints today. .   Objective: Vitals:   10/29/17 1009 10/29/17 1408 10/29/17 2145 10/30/17 0518  BP: 126/64 (!) 158/66 (!) 156/71 (!) 154/70  Pulse: 65 62 62 63  Resp:  18 17 17   Temp:  97.9 F (36.6 C) 98.1 F (36.7 C) 97.9 F (36.6 C)  TempSrc:  Oral Oral Oral  SpO2:  92% 95% 98%  Weight:  Height:       No intake or output data in the 24 hours ending 10/30/17 1625 Filed Weights   10/28/17 2101  Weight: 87.8 kg (193 lb 9 oz)    Examination:  General exam: Appears calm and comfortable no distress  Respiratory system: Clear to auscultation. Respiratory effort normal. No wheezing or rhonchi.  Cardiovascular system: S1 & S2 heard, RRR. No JVD. No pedal edema. Gastrointestinal system: Abdomen is nondistended, soft , NT bs+ Central nervous system: Alert and oriented. Non focal.  Extremities: no pedal edema no  cyanosis or clubbing.  Skin: No rashes, lesions or ulcers Psychiatry:Mood & affect appropriate.     Data Reviewed: I have personally reviewed following labs and imaging studies  CBC: Recent Labs  Lab 10/28/17 1428 10/29/17 0824  WBC 9.5 8.2  NEUTROABS 7.4  --   HGB 11.9* 11.2*  HCT 37.6* 36.3*  MCV 102.5* 102.5*  PLT 200 329   Basic Metabolic Panel: Recent Labs  Lab 10/28/17 1428 10/29/17 0824  NA 141 141  K 3.4* 4.0  CL 96* 97*  CO2 32 31  GLUCOSE 143* 139*  BUN 16 24*  CREATININE 3.53* 5.61*  CALCIUM 8.7* 8.9   GFR: Estimated Creatinine Clearance: 12.3 mL/min (A) (by C-G formula based on SCr of 5.61 mg/dL (H)). Liver Function Tests: Recent Labs  Lab 10/28/17 1428  AST 23  ALT 24  ALKPHOS 68  BILITOT 0.5  PROT 7.0  ALBUMIN 3.5   No results for input(s): LIPASE, AMYLASE in the last 168 hours. Recent Labs  Lab 10/28/17 1428  AMMONIA 41*   Coagulation Profile: Recent Labs  Lab 10/26/17 1306 10/28/17 1428 10/29/17 0824 10/30/17 0416  INR 1.6* 2.35 2.00 2.30   Cardiac Enzymes: No results for input(s): CKTOTAL, CKMB, CKMBINDEX, TROPONINI in the last 168 hours. BNP (last 3 results) Recent Labs    11/11/16 1143  PROBNP 18,028*   HbA1C: No results for input(s): HGBA1C in the last 72 hours. CBG: No results for input(s): GLUCAP in the last 168 hours. Lipid Profile: No results for input(s): CHOL, HDL, LDLCALC, TRIG, CHOLHDL, LDLDIRECT in the last 72 hours. Thyroid Function Tests: No results for input(s): TSH, T4TOTAL, FREET4, T3FREE, THYROIDAB in the last 72 hours. Anemia Panel: No results for input(s): VITAMINB12, FOLATE, FERRITIN, TIBC, IRON, RETICCTPCT in the last 72 hours. Sepsis Labs: Recent Labs  Lab 10/28/17 1435 10/28/17 1710 10/29/17 0824  LATICACIDVEN 2.02* 1.19 1.6    Recent Results (from the past 240 hour(s))  Culture, blood (routine x 2)     Status: None (Preliminary result)   Collection Time: 10/28/17  2:03 PM  Result  Value Ref Range Status   Specimen Description   Final    BLOOD RIGHT HAND Performed at [facility] [address], [place], [place] [address]    Special Requests   Final    BOTTLES DRAWN AEROBIC AND ANAEROBIC Blood Culture results may not be optimal due to an inadequate volume of blood received in culture bottles Performed at [facility] [address], [place], [place] [address]    Culture   Final    NO GROWTH 2 DAYS Performed at [facility] [address] [place], [place] [address]    Report Status PENDING  Incomplete  Culture, blood (routine x 2)     Status: None (Preliminary result)   Collection Time: 10/28/17  2:08 PM  Result Value Ref Range Status   Specimen Description  Final    BLOOD RIGHT ANTECUBITAL Performed at [facility] [address] [place], [place] [address]    Special Requests   Final    BOTTLES DRAWN AEROBIC AND ANAEROBIC Blood Culture adequate volume Performed at [facility] [address], [place], [place] [address]    Culture   Final    NO GROWTH 2 DAYS Performed at [facility] [address], [place], [place] [address]    Report Status PENDING  Incomplete  Urine culture     Status: Abnormal   Collection Time: 10/28/17  2:42 PM  Result Value Ref Range Status   Specimen Description   Final    URINE, RANDOM Performed at [facility] [address], [place], [place] [address]    Special Requests   Final    NONE Performed at [facility], [place] [address] [place], [place] [address]    Culture 80,000 COLONIES/mL ESCHERICHIA COLI (A)  Final   Report Status 10/30/2017 FINAL  Final   Organism ID, Bacteria ESCHERICHIA COLI (A)  Final      Susceptibility   Escherichia coli - MIC*    AMPICILLIN 16 INTERMEDIATE Intermediate     CEFAZOLIN <=4 SENSITIVE Sensitive     CEFTRIAXONE <=1 SENSITIVE Sensitive     CIPROFLOXACIN  <=0.25 SENSITIVE Sensitive     GENTAMICIN <=1 SENSITIVE Sensitive     IMIPENEM <=0.25 SENSITIVE Sensitive     NITROFURANTOIN <=16 SENSITIVE Sensitive     TRIMETH/SULFA <=20 SENSITIVE Sensitive     AMPICILLIN/SULBACTAM 4 SENSITIVE Sensitive     PIP/TAZO <=4 SENSITIVE Sensitive     Extended ESBL NEGATIVE Sensitive     * 80,000 COLONIES/mL ESCHERICHIA COLI  MRSA PCR Screening     Status: None   Collection Time: 10/29/17  6:05 AM  Result Value Ref Range Status   MRSA by PCR NEGATIVE NEGATIVE Final    Comment:        The GeneXpert MRSA Assay (FDA approved for NASAL specimens only), is one component of a comprehensive MRSA colonization surveillance program. It is not intended to diagnose MRSA infection nor to guide or monitor treatment for MRSA infections. Performed at [facility], [place] [address], [place], [place] [address]          Radiology Studies: No results found.      Scheduled Meds: . [START ON 10/31/2017] bromocriptine  5 mg Oral Q M,W,F  . [START ON 10/31/2017] Chlorhexidine Gluconate Cloth  6 each Topical Q0600  . cinacalcet  60 mg Oral BID WC  . digoxin  62.5 mcg Oral Daily  . diltiazem  120 mg Oral Daily  . dorzolamide-timolol  1 drop Both Eyes BID  . [START ON 10/31/2017] doxercalciferol  3 mcg Intravenous Q M,W,F-HD  . feeding supplement (NEPRO CARB STEADY)  237 mL Oral BID BM  . fluconazole  200 mg Oral Daily  . fosfomycin  3 g Oral Once  . multivitamin  1 tablet Oral QHS  . pantoprazole  40 mg Oral Daily  . polyethylene glycol  17 g Oral Daily  . pravastatin  10 mg Oral Daily  . predniSONE  5 mg Oral QHS  . sertraline  50 mg Oral Daily  . sevelamer carbonate  1,600 mg Oral TID WC  . warfarin  4 mg Oral ONCE-1800  . Warfarin - Pharmacist Dosing Inpatient   Does not apply q1800   Continuous Infusions: . sodium chloride Stopped (  10/29/17 0559)  . [START ON 10/31/2017] cefTAZidime (FORTAZ)  IV       LOS: 2 days    Time spent: 35 minutes.      Hosie Poisson, MD [facility] Pager [phone]  If 7PM-7AM, please contact night-coverage [email or web address] Password [email or web address] 10/30/2017, 4:25 PM

## 2017-10-30 NOTE — Care Management Obs Status (Signed)
[facility] NOTIFICATION   Patient Details  Name: ROSHAUN POUND MRN: [id number] Date of Birth: 1940-07-13   Medicare Observation Status Notification Given:  Yes    Carles Collet, RN 10/30/2017, 10:36 AM

## 2017-10-30 NOTE — Progress Notes (Signed)
Subjective: No new complaints   Antibiotics:  Anti-infectives (From admission, onward)   Start     Dose/Rate Route Frequency Ordered Stop   10/31/17 1200  cefTAZidime (FORTAZ) 2 g in sodium chloride 0.9 % 100 mL IVPB     2 g 200 mL/hr over 30 Minutes Intravenous Every M-W-F (Hemodialysis) 10/28/17 1815     10/30/17 1445  fluconazole (DIFLUCAN) tablet 200 mg     200 mg Oral Daily 10/30/17 1430     10/30/17 1415  fluconazole (DIFLUCAN) tablet 100 mg  Status:  Discontinued     100 mg Oral Daily 10/30/17 1404 10/30/17 1430   10/28/17 1830  cefTAZidime (FORTAZ) 1 g in sodium chloride 0.9 % 100 mL IVPB     1 g 200 mL/hr over 30 Minutes Intravenous NOW 10/28/17 1815 10/29/17 2214   10/28/17 1814  vancomycin variable dose per unstable renal function (pharmacist dosing)  Status:  Discontinued      Does not apply See admin instructions 10/28/17 1815 10/29/17 1551   10/28/17 1630  cefTAZidime (FORTAZ) 1 g in sodium chloride 0.9 % 100 mL IVPB    Note to Pharmacy:  Please consult for dialysis patient dosing   1 g 200 mL/hr over 30 Minutes Intravenous  Once 10/28/17 1622 10/28/17 1920      Medications: Scheduled Meds: . [START ON 10/31/2017] bromocriptine  5 mg Oral Q M,W,F  . [START ON 10/31/2017] Chlorhexidine Gluconate Cloth  6 each Topical Q0600  . cinacalcet  60 mg Oral BID WC  . digoxin  62.5 mcg Oral Daily  . diltiazem  120 mg Oral Daily  . dorzolamide-timolol  1 drop Both Eyes BID  . [START ON 10/31/2017] doxercalciferol  3 mcg Intravenous Q M,W,F-HD  . feeding supplement (NEPRO CARB STEADY)  237 mL Oral BID BM  . fluconazole  200 mg Oral Daily  . fosfomycin  3 g Oral Once  . multivitamin  1 tablet Oral QHS  . pantoprazole  40 mg Oral Daily  . polyethylene glycol  17 g Oral Daily  . pravastatin  10 mg Oral Daily  . predniSONE  5 mg Oral QHS  . sertraline  50 mg Oral Daily  . sevelamer carbonate  1,600 mg Oral TID WC  . warfarin  4 mg Oral ONCE-1800  . Warfarin -  Pharmacist Dosing Inpatient   Does not apply q1800   Continuous Infusions: . sodium chloride Stopped (10/29/17 0559)  . [START ON 10/31/2017] cefTAZidime (FORTAZ)  IV     PRN Meds:.sodium chloride, acetaminophen    Objective: Weight change:   Intake/Output Summary (Last 24 hours) at 10/30/2017 1515 Last data filed at 10/29/2017 1525 Gross per 24 hour  Intake 428.95 ml  Output -  Net 428.95 ml   Blood pressure (!) 154/70, pulse 63, temperature 97.9 F (36.6 C), temperature source Oral, resp. rate 17, height 5\' 10"  (1.778 m), weight 193 lb 9 oz (87.8 kg), SpO2 98 %. Temp:  [97.9 F (36.6 C)-98.1 F (36.7 C)] 97.9 F (36.6 C) (08/04 0518) Pulse Rate:  [62-63] 63 (08/04 0518) Resp:  [17] 17 (08/04 0518) BP: (154-156)/(70-71) 154/70 (08/04 0518) SpO2:  [95 %-98 %] 98 % (08/04 0518)  Physical Exam: General: Alert and awake, oriented person and place  HEENT: anicteric sclera, EOMI CVS regular rate, no mgr, PM pocket clean Chest: , no wheezing, no respiratory distress Abdomen: soft non-distended,  Extremities: no  deformity noted bilaterally Skin: no rashes Neuro:  nonfocal  CBC:    BMET Recent Labs    10/28/17 1428 10/29/17 0824  NA 141 141  K 3.4* 4.0  CL 96* 97*  CO2 32 31  GLUCOSE 143* 139*  BUN 16 24*  CREATININE 3.53* 5.61*  CALCIUM 8.7* 8.9     Liver Panel  Recent Labs    10/28/17 1428  PROT 7.0  ALBUMIN 3.5  AST 23  ALT 24  ALKPHOS 68  BILITOT 0.5       Sedimentation Rate Recent Labs    10/30/17 0416  ESRSEDRATE 44*   C-Reactive Protein Recent Labs    10/30/17 0416  CRP 1.9*    Micro Results: Recent Results (from the past 720 hour(s))  Culture, blood (routine x 2)     Status: None (Preliminary result)   Collection Time: 10/28/17  2:03 PM  Result Value Ref Range Status   Specimen Description   Final    BLOOD RIGHT HAND Performed at [facility] [address] [place] [address], [place], [place] [address]    Special Requests    Final    BOTTLES DRAWN AEROBIC AND ANAEROBIC Blood Culture results may not be optimal due to an inadequate volume of blood received in culture bottles Performed at [facility] [address], [place], [place] [address]    Culture   Final    NO GROWTH 2 DAYS Performed at [facility] [address], [place], [place] [address]    Report Status PENDING  Incomplete  Culture, blood (routine x 2)     Status: None (Preliminary result)   Collection Time: 10/28/17  2:08 PM  Result Value Ref Range Status   Specimen Description   Final    BLOOD RIGHT ANTECUBITAL Performed at [facility] [address] [place], [place] [address]    Special Requests   Final    BOTTLES DRAWN AEROBIC AND ANAEROBIC Blood Culture adequate volume Performed at [facility] [address], [place], [place] [address]    Culture   Final    NO GROWTH 2 DAYS Performed at [facility] [address] [place], [place] [address]    Report Status PENDING  Incomplete  Urine culture     Status: Abnormal   Collection Time: 10/28/17  2:42 PM  Result Value Ref Range Status   Specimen Description   Final    URINE, RANDOM Performed at [facility] [address], [place],  [address]    Special Requests   Final    NONE Performed at [facility], [place] [address] [place], [place] [address]    Culture 80,000 COLONIES/mL ESCHERICHIA COLI (A)  Final   Report Status 10/30/2017 FINAL  Final   Organism ID, Bacteria ESCHERICHIA COLI (A)  Final      Susceptibility   Escherichia coli - MIC*    AMPICILLIN 16 INTERMEDIATE Intermediate     CEFAZOLIN <=4 SENSITIVE Sensitive     CEFTRIAXONE <=1 SENSITIVE Sensitive     CIPROFLOXACIN <=0.25 SENSITIVE Sensitive     GENTAMICIN <=1 SENSITIVE Sensitive     IMIPENEM <=0.25 SENSITIVE Sensitive     NITROFURANTOIN <=16 SENSITIVE Sensitive     TRIMETH/SULFA <=20 SENSITIVE  Sensitive     AMPICILLIN/SULBACTAM 4 SENSITIVE Sensitive     PIP/TAZO <=4 SENSITIVE Sensitive     Extended ESBL NEGATIVE Sensitive     * 80,000 COLONIES/mL ESCHERICHIA COLI  MRSA PCR Screening  Status: None   Collection Time: 10/29/17  6:05 AM  Result Value Ref Range Status   MRSA by PCR NEGATIVE NEGATIVE Final    Comment:        The GeneXpert MRSA Assay (FDA approved for NASAL specimens only), is one component of a comprehensive MRSA colonization surveillance program. It is not intended to diagnose MRSA infection nor to guide or monitor treatment for MRSA infections. Performed at [facility], [place] [address] [place], [place] [address]     Studies/Results: No results found.    Assessment/Plan:  INTERVAL HISTORY: urine cx with ONLY 80K E coli NOT c/w UTI that would cause and explain his symptoms  Principal Problem:   Failure to thrive syndrome, adult Active Problems:   Anemia, unspecified   ESRD (end stage renal disease) on dialysis (HCC)   Hyperlipidemia   Persistent atrial fibrillation ([place])   Long term (current) use of anticoagulants [Z79.01]   UTI (urinary tract infection)   Hematuria    Alexander [name] is a 77 y.o. male with  Hx of TAVR, PM, ESRD on HD who had Strep gallyolytics bacteremia in the fall. He had TEE that negative for infection of the valves or the device.  He received 2 weeks of systemic antibiotics.  Talking the patient and his daughter today it is apparent that he never did have an evaluation of his colon to screen for colon cancer which has an association with this particular bacteria.  I am worried that he may have an undiagnosed malignancy that is responsible for his failure to thrive over the last month.  He is not sure about how much weight he may have lost but he has had some weight and he does state that during hemodialysis there have been troubles with his blood pressure getting too low over the last 6 months.  #1 Dysuruia: I  think this is balanitis  There is NO WAY 10K enterococcus or 80 K E coli would be a unifying diagnosis for his downward clinical trend or be remotely c/w severe UTI and in former case not c/w uti at all  I am DC systemic ceftazidime  I will give one  Time dose of monurol that will only act at level of bladder (it is also unnecessary but I just want to take this off the table of consideration)  Start fluconazole  #2 Failure to thrive: I am worried he may have an undiagnosed malignancy and I am getting a CT of the abdomen and pelvis with oral and IV contrast.  Clearly he needs a colonoscopy but I doubt he will get one urgently while he is in the hospital and a CT scan will allow [place] to quickly see something that is severe though we will certainly not exclude colon cancer at all.  Certainly or other things that may explain his failure to thrive but given the fact that he had known Streptococcus bovis bacteremia we should certainly take that as a clue.  3.  History of Streptococcus bovis (gallyolyticus)) bacteremia: Repeating TTE. Consider TEE if we do not find other reasons for his FTT  #4 Confusion:  If his pacemaker is compatible with an MRI I would get an MRI of the brain without contrast and I would certainly consult neurology.   I spent greater than 35 minutes with the patient including greater than 50% of time in face to face counsel of the patient re his FTT, weight loss, bacteremia, and necessary workup and in coordination  of care.  Dr. Baxter Flattery to see tomorrow.    LOS: 2 days   Alcide Evener 10/30/2017, 3:15 PM

## 2017-10-31 ENCOUNTER — Telehealth: Payer: Self-pay

## 2017-10-31 ENCOUNTER — Telehealth: Payer: Self-pay | Admitting: Cardiology

## 2017-10-31 ENCOUNTER — Inpatient Hospital Stay (HOSPITAL_COMMUNITY): Payer: Medicare Other

## 2017-10-31 ENCOUNTER — Encounter (HOSPITAL_COMMUNITY): Payer: Self-pay | Admitting: Radiology

## 2017-10-31 DIAGNOSIS — N481 Balanitis: Secondary | ICD-10-CM

## 2017-10-31 LAB — CBC
HCT: 39.1 % (ref 39.0–52.0)
HEMOGLOBIN: 12.1 g/dL — AB (ref 13.0–17.0)
MCH: 31.8 pg (ref 26.0–34.0)
MCHC: 30.9 g/dL (ref 30.0–36.0)
MCV: 102.9 fL — ABNORMAL HIGH (ref 78.0–100.0)
PLATELETS: 213 10*3/uL (ref 150–400)
RBC: 3.8 MIL/uL — AB (ref 4.22–5.81)
RDW: 18.6 % — ABNORMAL HIGH (ref 11.5–15.5)
WBC: 11.7 10*3/uL — ABNORMAL HIGH (ref 4.0–10.5)

## 2017-10-31 LAB — BASIC METABOLIC PANEL
ANION GAP: 16 — AB (ref 5–15)
BUN: 55 mg/dL — ABNORMAL HIGH (ref 8–23)
CALCIUM: 9.2 mg/dL (ref 8.9–10.3)
CO2: 27 mmol/L (ref 22–32)
CREATININE: 10.12 mg/dL — AB (ref 0.61–1.24)
Chloride: 94 mmol/L — ABNORMAL LOW (ref 98–111)
GFR, EST AFRICAN AMERICAN: 5 mL/min — AB (ref >60)
GFR, EST NON AFRICAN AMERICAN: 4 mL/min — AB (ref >60)
Glucose, Bld: 181 mg/dL — ABNORMAL HIGH (ref 70–99)
Potassium: 4.4 mmol/L (ref 3.5–5.1)
Sodium: 137 mmol/L (ref 135–145)

## 2017-10-31 LAB — PROTIME-INR
INR: 2.54
PROTHROMBIN TIME: 27.1 s — AB (ref 11.4–15.2)

## 2017-10-31 MED ORDER — LIDOCAINE HCL (PF) 1 % IJ SOLN: 5.0000 mL | INTRAMUSCULAR | Status: DC | PRN

## 2017-10-31 MED ORDER — LIDOCAINE-PRILOCAINE 2.5-2.5 % EX CREA: 1.0000 "application " | TOPICAL_CREAM | CUTANEOUS | Status: DC | PRN

## 2017-10-31 MED ORDER — WARFARIN SODIUM 2 MG PO TABS
2.0000 mg | ORAL_TABLET | Freq: Once | ORAL | Status: AC
  Administered 2017-10-31: 2 mg via ORAL
  Filled 2017-10-31: qty 1

## 2017-10-31 MED ORDER — SODIUM CHLORIDE 0.9 % IV SOLN: 100.0000 mL | INTRAVENOUS | Status: DC | PRN

## 2017-10-31 MED ORDER — HEPARIN SODIUM (PORCINE) 1000 UNIT/ML DIALYSIS
2800.0000 [IU] | Freq: Once | INTRAMUSCULAR | Status: AC
  Administered 2017-10-31: 2800 [IU] via INTRAVENOUS_CENTRAL

## 2017-10-31 MED ORDER — IOHEXOL 300 MG/ML  SOLN
100.0000 mL | Freq: Once | INTRAMUSCULAR | Status: AC | PRN
  Administered 2017-10-31: 100 mL via INTRAVENOUS

## 2017-10-31 MED ORDER — PENTAFLUOROPROP-TETRAFLUOROETH EX AERO: 1.0000 "application " | INHALATION_SPRAY | CUTANEOUS | Status: DC | PRN

## 2017-10-31 MED ORDER — DOXERCALCIFEROL 4 MCG/2ML IV SOLN
INTRAVENOUS | Status: AC
  Administered 2017-10-31: 3 ug
  Filled 2017-10-31: qty 2

## 2017-10-31 NOTE — Progress Notes (Signed)
[facility] for warfarin Indication: hx atrial fibrillation  Allergies  Allergen Reactions  . Penicillins Swelling and Rash    Has patient had a PCN reaction causing immediate rash, facial/tongue/throat swelling, SOB or lightheadedness with hypotension: Yes Has patient had a PCN reaction causing severe rash involving mucus membranes or skin necrosis: No Has patient had a PCN reaction that required hospitalization: No Has patient had a PCN reaction occurring within the last 10 years: No If all of the above answers are "NO", then may proceed with Cephalosporin use.   . Atorvastatin     MYALGIA   . Codeine Nausea Only  . Tramadol Nausea Only    Patient Measurements: Height: 5\' 10"  (177.8 cm) Weight: 193 lb 9 oz (87.8 kg) IBW/kg (Calculated) : 73 Heparin Dosing Weight:   Vital Signs: Temp: 97.5 F (36.4 C) (08/05 0431) Temp Source: Oral (08/05 0431) BP: 156/66 (08/05 0431) Pulse Rate: 63 (08/05 0431)  Labs: Recent Labs    10/28/17 1428 10/29/17 0824 10/30/17 0416 10/31/17 0457  HGB 11.9* 11.2*  --   --   HCT 37.6* 36.3*  --   --   PLT 200 172  --   --   LABPROT 25.5* 22.5* 25.1* 27.1*  INR 2.35 2.00 2.30 2.54  CREATININE 3.53* 5.61*  --   --     Estimated Creatinine Clearance: 12.3 mL/min (A) (by C-G formula based on SCr of 5.61 mg/dL (H)).   Medications:  Per outpt AC clinic note on 7/31--> INR 1.6, dose increased to 7.5 mg on 7/31 and then resume 5 mg daily except 2.5 mg on MWF (last dose taken on 8/1 per daughter)  Assessment: Alexander [name] is a 77 y.o. male with ESRD on HD MWF, hematuria, and afib on warfarin PTA presented to the ED on 10/28/17 with c/o lethargy and no appetite.  Pt's daughter reported that he saw his urologist Dr. Pilar Jarvis about two weeks ago for workup of hematuria.  Dr. Pilar Jarvis suspected that hematuria was secondary to UTI and referred pt to the [facility] for treatment of infection.  To resume warfarin  inpatient.  - INR is therapeutic at 2.54. Patient started on Fluconazole 8/4. Warfarin DDI w/ fluconazole. Previous dose decreased by ~20%.   Goal of Therapy:  INR 2-3 Monitor platelets by anticoagulation protocol: Yes   Plan:  - warfarin 2 mg PO x1 - daily INR - monitor for s/s bleeding especially for severity of hematuria  [name], PharmD PGY1 Pharmacy Resident Direct Phone: [phone] 10/31/2017  11:42 AM

## 2017-10-31 NOTE — Progress Notes (Signed)
PROGRESS NOTE    Alexander [name]  DJM:[id number] DOB: [birth year] DOA: 10/28/2017 PCP: Deland Pretty, MD    Brief Narrative:  Alexander [name] is a 77 y.o. male with medical history significant of ESRD on dialysis on M/W/F, history of a stroke, status post TAVR, permanent atrial fibrillation on pacemaker, on Coumadin, recurrent urinary tract infection who was sent by ID from the office to the emergency department for admission. As per the ID, patient urine culture grew Enterococcus faecalis which was resistant to most of the antibiotics. Since last 4 weeks, patient was having dysuria, burning sensation and hematuria.  Though he is on dialysis, he produces little bit of urine and he has been having increased frequency of urination. Patient will be admitted to [facility] for more work-up and IV antibiotics and possible dialysis on Monday. He was started on IV vancomycin and ceftazidime, and urine cultures sent.     Assessment & Plan:   Principal Problem:   Failure to thrive syndrome, adult Active Problems:   Anemia, unspecified   ESRD (end stage renal disease) on dialysis (HCC)   Hyperlipidemia   Persistent atrial fibrillation (HCC)   Long term (current) use of anticoagulants [Z79.01]   UTI (urinary tract infection)   Hematuria   Balanitis   Urinary tract infection: outpatient urine cultures growing drug resistant Enterococcus fecalis. Pt currently on IV vancomycin and ceftazidime and repeat cultures show 80,000 Ecoli, pan sensitive. The ceftazidime was discontinued and one dose of fosfomycin ordered by ID. Diflucan was also started for possible fungal balanitis. .  Pt denies any abdominal pain or flank pain, but continues to report some dysuria.  He has been afebrile and there is no leukocytosis.     ESRD on HD.  Nephrology aware of the pts HD on MWF.   H/o Streptococcal bovis bacteremia: TTE ordered by ID for evaluation of endocarditis. It does not show any vegetations.  The bovis  bacteremia was one year ago.    ? Confusion: Pt is alert and oriented today, pt has a h/o stroke more than 10 year ago. He has a pacemaker placed 3 years ago which is not MRI compatible. recommend Outpatient follow u with neurology.    Failure to thrive: unclear etiology.  TTE ordered by ID to evaluate for endocarditis.  CT abd and pelvis ordered to evaluate for malignancy. It shows enhancement of the bladder mucosa and a lesion in the posterolateral part of the bladder suspicious for malignancy. Urology consulted and Dr Jeffie Pollock will follow him as outpatient in the office for cystoscopy.  Dr Jeffie Pollock suggested preventive antibiotics like keflex for two weeks until the cystoscopy.   Hematuria;  Outpatient follow up with urology.    Atrial fibrillation, chronic On dig and cardizem.  On coumadin , .  INR therapeutic.   Hyperlipidemia: Resume statin.   Anemia of chronic disease:  Secondary to ESRD.    Hypokalemia: replaced.   Hyperlipidemia: Resume provachol.    Chronic osteoarthritis: Resume prednisone.        DVT prophylaxis:coumadin.  Code Status: full code.  Family Communication: daughters at bedside, discussed the plan of care with the patient and family.  Disposition Plan: possible d.c tomorrow home    Consultants:   Nephrology for HD.   ID   Procedures:NONE.   Antimicrobials:  Vancomycin and ceftazidime since admission till 8/4  Subjective: No complaints today. .   Objective: Vitals:   10/31/17 1347 10/31/17 1400 10/31/17 1430 10/31/17 1500  BP: (!) 137/57 134/61 [name])  129/53 (!) 114/53  Pulse: 65 63 66 61  Resp:      Temp:      TempSrc:      SpO2:      Weight:      Height:        Intake/Output Summary (Last 24 hours) at 10/31/2017 1523 Last data filed at 10/31/2017 1000 Gross per 24 hour  Intake 477 ml  Output -  Net 477 ml   Filed Weights   10/28/17 2101 10/31/17 1340  Weight: 87.8 kg (193 lb 9 oz) 89.8 kg (197 lb 15.6 oz)     Examination:  General exam: Appears calm and comfortable no distress .  Respiratory system: clear to auscultation, no wheezing or rhonchi.  Cardiovascular system: S1 & S2 heard, RRR. No JVD. No pedal edema. Gastrointestinal system: Abdomen is soft non tender, not distended bowel sounds heard.  Central nervous system: Alert and oriented. Non focal.  Extremities: no pedal edema, no cyanosis or clubbing.  Skin: No rashes, lesions or ulcers Psychiatry:Mood & affect appropriate.     Data Reviewed: I have personally reviewed following labs and imaging studies  CBC: Recent Labs  Lab 10/28/17 1428 10/29/17 0824 10/31/17 1119  WBC 9.5 8.2 11.7*  NEUTROABS 7.4  --   --   HGB 11.9* 11.2* 12.1*  HCT 37.6* 36.3* 39.1  MCV 102.5* 102.5* 102.9*  PLT 200 172 458   Basic Metabolic Panel: Recent Labs  Lab 10/28/17 1428 10/29/17 0824 10/31/17 1119  NA 141 141 137  K 3.4* 4.0 4.4  CL 96* 97* 94*  CO2 32 31 27  GLUCOSE 143* 139* 181*  BUN 16 24* 55*  CREATININE 3.53* 5.61* 10.12*  CALCIUM 8.7* 8.9 9.2   GFR: Estimated Creatinine Clearance: 6.9 mL/min (A) (by C-G formula based on SCr of 10.12 mg/dL (H)). Liver Function Tests: Recent Labs  Lab 10/28/17 1428  AST 23  ALT 24  ALKPHOS 68  BILITOT 0.5  PROT 7.0  ALBUMIN 3.5   No results for input(s): LIPASE, AMYLASE in the last 168 hours. Recent Labs  Lab 10/28/17 1428  AMMONIA 41*   Coagulation Profile: Recent Labs  Lab 10/26/17 1306 10/28/17 1428 10/29/17 0824 10/30/17 0416 10/31/17 0457  INR 1.6* 2.35 2.00 2.30 2.54   Cardiac Enzymes: No results for input(s): CKTOTAL, CKMB, CKMBINDEX, TROPONINI in the last 168 hours. BNP (last 3 results) Recent Labs    11/11/16 1143  PROBNP 18,028*   HbA1C: No results for input(s): HGBA1C in the last 72 hours. CBG: No results for input(s): GLUCAP in the last 168 hours. Lipid Profile: No results for input(s): CHOL, HDL, LDLCALC, TRIG, CHOLHDL, LDLDIRECT in the last 72  hours. Thyroid Function Tests: No results for input(s): TSH, T4TOTAL, FREET4, T3FREE, THYROIDAB in the last 72 hours. Anemia Panel: No results for input(s): VITAMINB12, FOLATE, FERRITIN, TIBC, IRON, RETICCTPCT in the last 72 hours. Sepsis Labs: Recent Labs  Lab 10/28/17 1435 10/28/17 1710 10/29/17 0824  LATICACIDVEN 2.02* 1.19 1.6    Recent Results (from the past 240 hour(s))  Culture, blood (routine x 2)     Status: None (Preliminary result)   Collection Time: 10/28/17  2:03 PM  Result Value Ref Range Status   Specimen Description   Final    BLOOD RIGHT HAND Performed at [facility] [address] [place], [place] [address]    Special Requests   Final    BOTTLES DRAWN AEROBIC AND ANAEROBIC Blood Culture results may not be optimal  due to an inadequate volume of blood received in culture bottles Performed at [facility] [address], [place], [place] [address]    Culture   Final    NO GROWTH 3 DAYS Performed at [facility], [place] [address] [place], [place] [address]    Report Status PENDING  Incomplete  Culture, blood (routine x 2)     Status: None (Preliminary result)   Collection Time: 10/28/17  2:08 PM  Result Value Ref Range Status   Specimen Description   Final    BLOOD RIGHT ANTECUBITAL Performed at [facility] [address], [place], [place] [address]    Special Requests   Final    BOTTLES DRAWN AEROBIC AND ANAEROBIC Blood Culture adequate volume Performed at [facility] [address], [place], [place] [address]    Culture   Final    NO GROWTH 3 DAYS Performed at [facility], [place] [address], [place], [place] [address]    Report Status PENDING  Incomplete  Urine culture     Status: Abnormal   Collection Time: 10/28/17  2:42 PM  Result Value Ref Range Status   Specimen Description   Final    URINE, RANDOM Performed at [facility]  [address], [place], [place] [address]    Special Requests   Final    NONE Performed at [facility], [place] [address] [place], [place] [address]    Culture 80,000 COLONIES/mL ESCHERICHIA COLI (A)  Final   Report Status 10/30/2017 FINAL  Final   Organism ID, Bacteria ESCHERICHIA COLI (A)  Final      Susceptibility   Escherichia coli - MIC*    AMPICILLIN 16 INTERMEDIATE Intermediate     CEFAZOLIN <=4 SENSITIVE Sensitive     CEFTRIAXONE <=1 SENSITIVE Sensitive     CIPROFLOXACIN <=0.25 SENSITIVE Sensitive     GENTAMICIN <=1 SENSITIVE Sensitive     IMIPENEM <=0.25 SENSITIVE Sensitive     NITROFURANTOIN <=16 SENSITIVE Sensitive     TRIMETH/SULFA <=20 SENSITIVE Sensitive     AMPICILLIN/SULBACTAM 4 SENSITIVE Sensitive     PIP/TAZO <=4 SENSITIVE Sensitive     Extended ESBL NEGATIVE Sensitive     * 80,000 COLONIES/mL ESCHERICHIA COLI  MRSA PCR Screening     Status: None   Collection Time: 10/29/17  6:05 AM  Result Value Ref Range Status   MRSA by PCR NEGATIVE NEGATIVE Final    Comment:        The GeneXpert MRSA Assay (FDA approved for NASAL specimens only), is one component of a comprehensive MRSA colonization surveillance program. It is not intended to diagnose MRSA infection nor to guide or monitor treatment for MRSA infections. Performed at [facility], [place] [address], [place], [place] [address]          Radiology Studies: Ct Abdomen Pelvis W Contrast  Result Date: 10/31/2017 CLINICAL DATA:  Rectal bleeding for 2 weeks. Unintended weight loss. Abdominal pain. EXAM: CT ABDOMEN AND PELVIS WITH CONTRAST TECHNIQUE: Multidetector CT imaging of the abdomen and pelvis was performed using the standard protocol following bolus administration of intravenous contrast. CONTRAST:  166mL OMNIPAQUE IOHEXOL 300 MG/ML  SOLN COMPARISON:  CT scan of the abdomen dated 08/24/2016 FINDINGS: Lower chest: Small left pleural effusion, minimally more prominent than on the study  in 2018. Tiny right effusion. Aortic atherosclerosis. Hepatobiliary: No focal liver abnormality is seen. No gallstones, gallbladder wall thickening, or biliary  dilatation. Pancreas: Unremarkable. No pancreatic ductal dilatation or surrounding inflammatory changes. Tiny benign calcifications in the pancreatic head and in the pancreatic tail, unchanged. Spleen: Normal in size without focal abnormality. Adrenals/Urinary Tract: 6 mm area of nodular enhancement at the inferior right posterolateral aspect of the bladder seen on image 80 of series 3. Subtle enhancement of the right side of the dome of the bladder best seen on image 70 of series 6 and image 93 of series 7. Severe bilateral renal atrophy with multiple bilateral renal cysts, unchanged. No hydronephrosis. Normal adrenal glands. Stomach/Bowel: Sigmoid diverticulosis. Suggestion of slight rectal prolapse with prominence of the soft tissues at the anus. Otherwise negative. Vascular/Lymphatic: Extensive aortic atherosclerosis. No adenopathy. Reproductive: Focal cyst in the median lobe of the prostate gland, unchanged. Otherwise negative. Other: No abdominal wall hernia or abnormality. No abdominopelvic ascites. Musculoskeletal: Chronic severe degenerative disc disease throughout the lumbar spine. No acute abnormalities. IMPRESSION: 1. Suggestion of slight rectal prolapse with prominence of the soft tissues at the anus. This could be due to hemorrhoids but I can't exclude a mass. 2. Small enhancing lesion in the right posterolateral aspect of the bladder as well as slight enhancement of the mucosa of the dome of the bladder. Possibility of bladder neoplasm should be considered. 3. Extensive diverticulosis of the sigmoid portion of the colon without discrete diverticulitis. 4.  Aortic Atherosclerosis (ICD10-I70.0). 5. Small nonspecific left pleural effusion, minimally increased since 2018. Electronically Signed   By: Lorriane Shire M.D.   On: 10/31/2017 09:57         Scheduled Meds: . bromocriptine  5 mg Oral Q M,W,F  . Chlorhexidine Gluconate Cloth  6 each Topical Q0600  . cinacalcet  60 mg Oral BID WC  . digoxin  62.5 mcg Oral Daily  . diltiazem  120 mg Oral Daily  . dorzolamide-timolol  1 drop Both Eyes BID  . doxercalciferol  3 mcg Intravenous Q M,W,F-HD  . feeding supplement (NEPRO CARB STEADY)  237 mL Oral BID BM  . heparin  2,800 Units Dialysis Once in dialysis  . multivitamin  1 tablet Oral QHS  . pantoprazole  40 mg Oral Daily  . polyethylene glycol  17 g Oral Daily  . pravastatin  10 mg Oral Daily  . predniSONE  5 mg Oral QHS  . sertraline  50 mg Oral Daily  . sevelamer carbonate  1,600 mg Oral TID WC  . warfarin  2 mg Oral ONCE-1800  . Warfarin - Pharmacist Dosing Inpatient   Does not apply q1800   Continuous Infusions: . sodium chloride Stopped (10/29/17 0559)  . sodium chloride    . sodium chloride       LOS: 2 days    Time spent: 35 minutes.     Hosie Poisson, MD [facility] Pager [phone]  If 7PM-7AM, please contact night-coverage [email or web address] Password [facility] 10/31/2017, 3:23 PM

## 2017-10-31 NOTE — Progress Notes (Signed)
Patient daughter confirmed with the patients cardiologist that his pacemaker is not compatible with MRI will call and inform MD.

## 2017-10-31 NOTE — Telephone Encounter (Signed)
Received a fax today from referring office for last office note to be faxed [phone]. Office note faxed confirmation received. [place]

## 2017-10-31 NOTE — Progress Notes (Addendum)
[facility]  Date of Admission:  10/28/2017   Total days of antibiotics 3 total                   Patient ID: Alexander [name] is an 77 y.o. male with Principal Problem:   Failure to thrive syndrome, adult Active Problems:   Hematuria   Anemia, unspecified   ESRD (end stage renal disease) on dialysis ([place])   Hyperlipidemia   Persistent atrial fibrillation ([place])   Long term (current) use of anticoagulants [Z79.01]   UTI (urinary tract infection)   Balanitis   . bromocriptine  5 mg Oral Q M,W,F  . Chlorhexidine Gluconate Cloth  6 each Topical Q0600  . cinacalcet  60 mg Oral BID WC  . digoxin  62.5 mcg Oral Daily  . diltiazem  120 mg Oral Daily  . dorzolamide-timolol  1 drop Both Eyes BID  . doxercalciferol  3 mcg Intravenous Q M,W,F-HD  . feeding supplement (NEPRO CARB STEADY)  237 mL Oral BID BM  . heparin  2,800 Units Dialysis Once in dialysis  . multivitamin  1 tablet Oral QHS  . pantoprazole  40 mg Oral Daily  . polyethylene glycol  17 g Oral Daily  . pravastatin  10 mg Oral Daily  . predniSONE  5 mg Oral QHS  . sertraline  50 mg Oral Daily  . sevelamer carbonate  1,600 mg Oral TID WC  . warfarin  2 mg Oral ONCE-1800  . Warfarin - Pharmacist Dosing Inpatient   Does not apply q1800    SUBJECTIVE: Feeling a little better today from what Alexander [name] tells me but likely due to not doing much. His daughter adds that it is really only a "baby step" of improvement as his symptoms stem back slowly worsening for a while now. Anxious to have CT scan results today. They cannot tell me if he has ongoing hematuria as they have not seen any urine samples from him lately. They deny BRBPR when he has BM or when he wipes.    Allergies  Allergen Reactions  . Penicillins Swelling and Rash    Has patient had a PCN reaction causing immediate rash, facial/tongue/throat swelling, SOB or lightheadedness with hypotension: Yes Has patient had a PCN reaction  causing severe rash involving mucus membranes or skin necrosis: No Has patient had a PCN reaction that required hospitalization: No Has patient had a PCN reaction occurring within the last 10 years: No If all of the above answers are "NO", then may proceed with Cephalosporin use.   . Atorvastatin     MYALGIA   . Codeine Nausea Only  . Tramadol Nausea Only    OBJECTIVE: Vitals:   10/31/17 0431 10/31/17 1343 10/31/17 1347 10/31/17 1400  BP: (!) 156/66 (!) 150/78 (!) 137/57 134/61  Pulse: 63 67 65 63  Resp: 18     Temp: (!) 97.5 F (36.4 C)     TempSrc: Oral     SpO2: 98%     Weight:      Height:       Body mass index is 27.77 kg/m.  Physical Exam  Constitutional: He is oriented to person, place, and time. He appears well-developed.  Seated comfortably in recliner. Wife and daughter are present.   HENT:  Mouth/Throat: Oropharynx is clear and moist.  Eyes: Pupils are equal, round, and reactive to light.  Legally blind   Neck: No JVD present.  Cardiovascular: Normal rate and regular rhythm.  No murmur heard. Pulmonary/Chest: Effort normal and breath sounds normal. No respiratory distress.  Abdominal: Soft. He exhibits no distension. There is no tenderness.  Lymphadenopathy:    He has no cervical adenopathy.  Neurological: He is alert and oriented to person, place, and time.  Skin: Skin is warm and dry. Capillary refill takes less than 2 seconds.  Psychiatric: He has a normal mood and affect.    Lab Results Lab Results  Component Value Date   WBC 11.7 (H) 10/31/2017   HGB 12.1 (L) 10/31/2017   HCT 39.1 10/31/2017   MCV 102.9 (H) 10/31/2017   PLT 213 10/31/2017    Lab Results  Component Value Date   CREATININE 10.12 (H) 10/31/2017   BUN 55 (H) 10/31/2017   NA 137 10/31/2017   K 4.4 10/31/2017   CL 94 (L) 10/31/2017   CO2 27 10/31/2017    Lab Results  Component Value Date   ALT 24 10/28/2017   AST 23 10/28/2017   ALKPHOS 68 10/28/2017   BILITOT 0.5  10/28/2017     Microbiology: Recent Results (from the past 240 hour(s))  Culture, blood (routine x 2)     Status: None (Preliminary result)   Collection Time: 10/28/17  2:03 PM  Result Value Ref Range Status   Specimen Description   Final    BLOOD RIGHT HAND Performed at [facility], [place] [address] [place], [place] [address]    Special Requests   Final    BOTTLES DRAWN AEROBIC AND ANAEROBIC Blood Culture results may not be optimal due to an inadequate volume of blood received in culture bottles Performed at [facility] [address] [place], [place] [address]    Culture   Final    NO GROWTH 3 DAYS Performed at [facility], [place] [address], [place], [place] [address]    Report Status PENDING  Incomplete  Culture, blood (routine x 2)     Status: None (Preliminary result)   Collection Time: 10/28/17  2:08 PM  Result Value Ref Range Status   Specimen Description   Final    BLOOD RIGHT ANTECUBITAL Performed at [facility] [address] [place], [place] [address]    Special Requests   Final    BOTTLES DRAWN AEROBIC AND ANAEROBIC Blood Culture adequate volume Performed at [facility] [address] [place], [place] [address]    Culture   Final    NO GROWTH 3 DAYS Performed at [facility], [place] [address], [place], [place] [address]    Report Status PENDING  Incomplete  Urine culture     Status: Abnormal   Collection Time: 10/28/17  2:42 PM  Result Value Ref Range Status   Specimen Description   Final    URINE, RANDOM Performed at [facility] [address] [place],  [address]    Special Requests   Final    NONE Performed at [facility], [place] [address] [place], [place] [address]    Culture 80,000 COLONIES/mL ESCHERICHIA COLI (A)  Final   Report Status 10/30/2017 FINAL  Final   Organism ID, Bacteria ESCHERICHIA COLI (A)  Final        Susceptibility   Escherichia coli - MIC*    AMPICILLIN 16 INTERMEDIATE Intermediate     CEFAZOLIN <=4 SENSITIVE Sensitive     CEFTRIAXONE <=1 SENSITIVE Sensitive  CIPROFLOXACIN <=0.25 SENSITIVE Sensitive     GENTAMICIN <=1 SENSITIVE Sensitive     IMIPENEM <=0.25 SENSITIVE Sensitive     NITROFURANTOIN <=16 SENSITIVE Sensitive     TRIMETH/SULFA <=20 SENSITIVE Sensitive     AMPICILLIN/SULBACTAM 4 SENSITIVE Sensitive     PIP/TAZO <=4 SENSITIVE Sensitive     Extended ESBL NEGATIVE Sensitive     * 80,000 COLONIES/mL ESCHERICHIA COLI  MRSA PCR Screening     Status: None   Collection Time: 10/29/17  6:05 AM  Result Value Ref Range Status   MRSA by PCR NEGATIVE NEGATIVE Final    Comment:        The GeneXpert MRSA Assay (FDA approved for NASAL specimens only), is one component of a comprehensive MRSA colonization surveillance program. It is not intended to diagnose MRSA infection nor to guide or monitor treatment for MRSA infections. Performed at [facility], [place] [address] [place], [place] [address]     Assessment:  Alexander [name] is a 77 y.o. male with  Hx of TAVR, PPM, ESRD on HD admitted for ongoing lethargy/weakness in the setting of treatment for possible e. faecalis UTI with one week of Vancomycin given after HD with no improvement. CT of the a/p was obtained today and revealed a possible bladder neoplasm - which was my concern when I saw him outpatient. We discussed briefly results of CT being a questionable mass on bladder wall that will need further characterization with urology through cystoscopy - which we discussed at outpatient appointment previously.   He has a history of Strep gallyolytics bacteremia in fall 2018. He had TEE that was negative for infection of the valves or the device but had not had colonoscopy follow up for consideration of colonic neoplasm. CT of a/p with possible rectal prolapse involving tissue of the anus, but unable to exclude a  mass.   Plan:  1. Follow up with urology team for outpatient cystoscopy.  2. Will also need follow up with GI team for better understanding CT regarding rectal prolapse vs hemorrhoid vs neoplasm considering history of bacteremia  3. Continue to observe off antibiotics.  4. Stop fluconazole as candidal balanitis typically treated with only 1 dose systemically vs topical for 1-3 weeks. Interaction with coumadin as well. If symptoms persist would consider topical nystatin cream.   Will be available for the care of Mr. Stadler in the future should there be a change in his condition/concern for infection. Outpatient follow up with urology/GI/PCP teams.   Janene Madeira, MSN, NP-C [facility] [place] Cell: [phone] Pager: [phone]  10/31/2017  2:29 PM

## 2017-10-31 NOTE — Telephone Encounter (Signed)
Pt daughter called to see if his ppm was MRI compatible. I let her talk with the device tech nurse.

## 2017-10-31 NOTE — Telephone Encounter (Signed)
New Message:       Patient's daughter is calling and states the pt is currently in the hosp and they are needing to do an MRI but they are needing to know is a MRI compatible with his pacer.

## 2017-10-31 NOTE — Progress Notes (Signed)
Pharmacy Communication  Spoke with Janene Madeira, NP and agreed to stop fluconazole. Balanitis is typically treated with 1 dose of fluconazole which patient has received. Patient improved today.     [facility] Pharmacist Phone: [phone]

## 2017-10-31 NOTE — Telephone Encounter (Signed)
Spoke with pts daughter informed her that pacemaker is not MRI compatible. She voiced understanding.

## 2017-10-31 NOTE — Progress Notes (Addendum)
[facility] Progress Note   Subjective: No new issues over night. 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 Says he feels better. Just returned from CT. No new complaints.    Objective Vitals:   10/29/17 2145 10/30/17 0518 10/30/17 2150 10/31/17 0431  BP: (!) 156/71 (!) 154/70 (!) 132/48 (!) 156/66  Pulse: 62 63 63 63  Resp: 17 17 17 18   Temp: 98.1 F (36.7 C) 97.9 F (36.6 C) 97.8 F (36.6 C) (!) 97.5 F (36.4 C)  TempSrc: Oral Oral Oral Oral  SpO2: 95% 98% 98% 98%  Weight:      Height:       Physical Exam General: Pleasant, elderly male in NAD Heart: S1,S2 regularly irregular Lungs: CTAB A/P Abdomen: Active BS Extremities:No LE edema Dialysis Access: LFA AVF + bruit   Additional Objective Labs: Basic Metabolic Panel: Recent Labs  Lab 10/28/17 1428 10/29/17 0824  NA 141 141  K 3.4* 4.0  CL 96* 97*  CO2 32 31  GLUCOSE 143* 139*  BUN 16 24*  CREATININE 3.53* 5.61*  CALCIUM 8.7* 8.9   Liver Function Tests: Recent Labs  Lab 10/28/17 1428  AST 23  ALT 24  ALKPHOS 68  BILITOT 0.5  PROT 7.0  ALBUMIN 3.5   No results for input(s): LIPASE, AMYLASE in the last 168 hours. CBC: Recent Labs  Lab 10/28/17 1428 10/29/17 0824  WBC 9.5 8.2  NEUTROABS 7.4  --   HGB 11.9* 11.2*  HCT 37.6* 36.3*  MCV 102.5* 102.5*  PLT 200 172   Blood Culture    Component Value Date/Time   SDES  10/28/2017 1442    URINE, RANDOM Performed at [facility], [place] [address] [place], [place] [address]    SPECREQUEST  10/28/2017 1442    NONE Performed at [facility], [place] [address], [place], [place] [address]    CULT 80,000 COLONIES/mL ESCHERICHIA COLI (A) 10/28/2017 1442   REPTSTATUS 10/30/2017 FINAL 10/28/2017 1442    Cardiac Enzymes: No results for input(s): CKTOTAL, CKMB, CKMBINDEX, TROPONINI in the last 168 hours. CBG: No results for input(s): GLUCAP in the last 168 hours. Iron Studies: No results  for input(s): IRON, TIBC, TRANSFERRIN, FERRITIN in the last 72 hours. @lablastinr3 @ Studies/Results: Ct Abdomen Pelvis W Contrast  Result Date: 10/31/2017 CLINICAL DATA:  Rectal bleeding for 2 weeks. Unintended weight loss. Abdominal pain. EXAM: CT ABDOMEN AND PELVIS WITH CONTRAST TECHNIQUE: Multidetector CT imaging of the abdomen and pelvis was performed using the standard protocol following bolus administration of intravenous contrast. CONTRAST:  151mL OMNIPAQUE IOHEXOL 300 MG/ML  SOLN COMPARISON:  CT scan of the abdomen dated 08/24/2016 FINDINGS: Lower chest: Small left pleural effusion, minimally more prominent than on the study in 2018. Tiny right effusion. Aortic atherosclerosis. Hepatobiliary: No focal liver abnormality is seen. No gallstones, gallbladder wall thickening, or biliary dilatation. Pancreas: Unremarkable. No pancreatic ductal dilatation or surrounding inflammatory changes. Tiny benign calcifications in the pancreatic head and in the pancreatic tail, unchanged. Spleen: Normal in size without focal abnormality. Adrenals/Urinary Tract: 6 mm area of nodular enhancement at the inferior right posterolateral aspect of the bladder seen on image 80 of series 3. Subtle enhancement of the right side of the dome of the bladder best seen on image 70 of series 6 and image 93 of series 7. Severe bilateral renal atrophy with multiple bilateral renal cysts, unchanged. No hydronephrosis. Normal adrenal glands. Stomach/Bowel: Sigmoid diverticulosis. Suggestion of slight rectal prolapse with prominence of the soft  tissues at the anus. Otherwise negative. Vascular/Lymphatic: Extensive aortic atherosclerosis. No adenopathy. Reproductive: Focal cyst in the median lobe of the prostate gland, unchanged. Otherwise negative. Other: No abdominal wall hernia or abnormality. No abdominopelvic ascites. Musculoskeletal: Chronic severe degenerative disc disease throughout the lumbar spine. No acute abnormalities. IMPRESSION:  1. Suggestion of slight rectal prolapse with prominence of the soft tissues at the anus. This could be due to hemorrhoids but I can't exclude a mass. 2. Small enhancing lesion in the right posterolateral aspect of the bladder as well as slight enhancement of the mucosa of the dome of the bladder. Possibility of bladder neoplasm should be considered. 3. Extensive diverticulosis of the sigmoid portion of the colon without discrete diverticulitis. 4.  Aortic Atherosclerosis (ICD10-I70.0). 5. Small nonspecific left pleural effusion, minimally increased since 2018. Electronically Signed   By: Lorriane Shire M.D.   On: 10/31/2017 09:57   Medications: . sodium chloride Stopped (10/29/17 0559)  . cefTAZidime (FORTAZ)  IV     . bromocriptine  5 mg Oral Q M,W,F  . Chlorhexidine Gluconate Cloth  6 each Topical Q0600  . cinacalcet  60 mg Oral BID WC  . digoxin  62.5 mcg Oral Daily  . diltiazem  120 mg Oral Daily  . dorzolamide-timolol  1 drop Both Eyes BID  . doxercalciferol  3 mcg Intravenous Q M,W,F-HD  . feeding supplement (NEPRO CARB STEADY)  237 mL Oral BID BM  . fluconazole  200 mg Oral Daily  . multivitamin  1 tablet Oral QHS  . pantoprazole  40 mg Oral Daily  . polyethylene glycol  17 g Oral Daily  . pravastatin  10 mg Oral Daily  . predniSONE  5 mg Oral QHS  . sertraline  50 mg Oral Daily  . sevelamer carbonate  1,600 mg Oral TID WC  . Warfarin - Pharmacist Dosing Inpatient   Does not apply q1800     Dialysis Orders: [facility] MWF 4 hrs 160 NRe 450/Autoflow 1.5 87 kg 2.0 K/2.0 Ca Linear Na L AVF -Heparin 2800 units IV TIW -Hectorol 3 mcg IV TIW -Mircera 200 mcg IV q 2 weeks (last dose 10/26/17 Last HGB 11 10/26/17)   Assessment/Plan: 1.  UTI vs Balanitis-per primary-ID following. Currently on Ceftazidime.Urine culture positive for Ecoli 80000 C/MLs. BC NG 24 hrs. CT of pelvis done this AM revealed small lesion R posteriolateral aspect of bladder. Will need urology consult for further  work up. 2.  ESRD on HD  MWF via AVF. HD today on schedule.  3.  Hypertension/volume  - No evidence of volume overload. BP controlled. Wt 87.8 kg-just above OP EDW. 1-1.5 liters UFG with HD tomorrow.  Patient currently on Cardizem 120 mg q 24. Metoprolol and midodrine on OP med list. These have not been resumed.  4.  Anemia  - HGB 11.2. No ESA needed.  5.  Metabolic bone disease -  Continue binders, sensipar and VDRA. Check renal function with HD tomorrow.  6.  Nutrition - Renal diet, renal vit, nepro 7.  H/O TAVR 2018 8.  H/O Streptococcus gallyolyticus bacteremia in setting TAVR/PPM 9.  Afib-on coumadin. Rate controlled with cardizem.     Rita H. Brown NP-C 10/31/2017, 10:05 AM  [facility] [phone]  Pt seen, examined and agree w A/P as above.  Kelly Splinter MD [employer] pager [phone]   10/31/2017, 2:04 PM

## 2017-11-01 ENCOUNTER — Ambulatory Visit (INDEPENDENT_AMBULATORY_CARE_PROVIDER_SITE_OTHER): Payer: Medicare Other | Admitting: *Deleted

## 2017-11-01 ENCOUNTER — Inpatient Hospital Stay (HOSPITAL_COMMUNITY): Payer: Medicare Other

## 2017-11-01 DIAGNOSIS — R634 Abnormal weight loss: Secondary | ICD-10-CM

## 2017-11-01 DIAGNOSIS — N481 Balanitis: Secondary | ICD-10-CM

## 2017-11-01 DIAGNOSIS — A491 Streptococcal infection, unspecified site: Secondary | ICD-10-CM

## 2017-11-01 DIAGNOSIS — G454 Transient global amnesia: Secondary | ICD-10-CM

## 2017-11-01 DIAGNOSIS — R001 Bradycardia, unspecified: Secondary | ICD-10-CM | POA: Diagnosis not present

## 2017-11-01 LAB — BASIC METABOLIC PANEL
ANION GAP: 13 (ref 5–15)
BUN: 27 mg/dL — ABNORMAL HIGH (ref 8–23)
CALCIUM: 8.6 mg/dL — AB (ref 8.9–10.3)
CO2: 29 mmol/L (ref 22–32)
Chloride: 98 mmol/L (ref 98–111)
Creatinine, Ser: 6.73 mg/dL — ABNORMAL HIGH (ref 0.61–1.24)
GFR calc non Af Amer: 7 mL/min — ABNORMAL LOW (ref >60)
GFR, EST AFRICAN AMERICAN: 8 mL/min — AB (ref >60)
Glucose, Bld: 107 mg/dL — ABNORMAL HIGH (ref 70–99)
Potassium: 3.8 mmol/L (ref 3.5–5.1)
Sodium: 140 mmol/L (ref 135–145)

## 2017-11-01 LAB — CBC
HEMATOCRIT: 36.4 % — AB (ref 39.0–52.0)
Hemoglobin: 11.5 g/dL — ABNORMAL LOW (ref 13.0–17.0)
MCH: 33.1 pg (ref 26.0–34.0)
MCHC: 31.6 g/dL (ref 30.0–36.0)
MCV: 104.9 fL — ABNORMAL HIGH (ref 78.0–100.0)
PLATELETS: 207 10*3/uL (ref 150–400)
RBC: 3.47 MIL/uL — ABNORMAL LOW (ref 4.22–5.81)
RDW: 18.6 % — AB (ref 11.5–15.5)
WBC: 10.5 10*3/uL (ref 4.0–10.5)

## 2017-11-01 LAB — PROTIME-INR
INR: 2.39
PROTHROMBIN TIME: 25.9 s — AB (ref 11.4–15.2)

## 2017-11-01 LAB — GLUCOSE, CAPILLARY: GLUCOSE-CAPILLARY: 141 mg/dL — AB (ref 70–99)

## 2017-11-01 MED ORDER — CEPHALEXIN 250 MG PO CAPS: 250.0000 mg | ORAL_CAPSULE | Freq: Every day | ORAL | 0 refills | Status: DC

## 2017-11-01 MED ORDER — WARFARIN SODIUM 5 MG PO TABS
5.0000 mg | ORAL_TABLET | Freq: Once | ORAL | Status: AC
  Administered 2017-11-01: 5 mg via ORAL
  Filled 2017-11-01: qty 1

## 2017-11-01 MED ORDER — CEPHALEXIN 250 MG PO CAPS
250.0000 mg | ORAL_CAPSULE | Freq: Every day | ORAL | Status: DC
  Administered 2017-11-01 – 2017-11-02 (×2): 250 mg via ORAL
  Filled 2017-11-01 (×2): qty 1

## 2017-11-01 MED ORDER — CHLORHEXIDINE GLUCONATE CLOTH 2 % EX PADS: 6.0000 | MEDICATED_PAD | Freq: Every day | CUTANEOUS | Status: DC

## 2017-11-01 NOTE — Progress Notes (Signed)
[place] for warfarin Indication: hx atrial fibrillation  Allergies  Allergen Reactions  . Penicillins Swelling and Rash    Has patient had a PCN reaction causing immediate rash, facial/tongue/throat swelling, SOB or lightheadedness with hypotension: Yes Has patient had a PCN reaction causing severe rash involving mucus membranes or skin necrosis: No Has patient had a PCN reaction that required hospitalization: No Has patient had a PCN reaction occurring within the last 10 years: No If all of the above answers are "NO", then may proceed with Cephalosporin use.   . Atorvastatin     MYALGIA   . Codeine Nausea Only  . Tramadol Nausea Only    Patient Measurements: Height: 5\' 10"  (177.8 cm) Weight: 194 lb 10.7 oz (88.3 kg) IBW/kg (Calculated) : 73  Vital Signs: Temp: 98 F (36.7 C) (08/06 0510) Temp Source: Oral (08/06 0510) BP: 141/71 (08/06 0901) Pulse Rate: 61 (08/06 0901)  Labs: Recent Labs    10/30/17 0416 10/31/17 0457 10/31/17 1119 11/01/17 0501  HGB  --   --  12.1*  --   HCT  --   --  39.1  --   PLT  --   --  213  --   LABPROT 25.1* 27.1*  --  25.9*  INR 2.30 2.54  --  2.39  CREATININE  --   --  10.12*  --     Estimated Creatinine Clearance: 6.8 mL/min (A) (by C-G formula based on SCr of 10.12 mg/dL (H)).   Medications:  Per outpt AC clinic note on 7/31--> INR 1.6, dose increased to 7.5 mg on 7/31 and then resume 5 mg daily except 2.5 mg on MWF (last dose taken on 8/1 per daughter)  Assessment: Alexander [name] is a 77 y.o. male with ESRD on HD MWF, hematuria, and afib on warfarin PTA presented to the ED on 10/28/17 with c/o lethargy and no appetite.  Pt's daughter reported that he saw his urologist Dr. Pilar Jarvis about two weeks ago for workup of hematuria.  Dr. Pilar Jarvis suspected that hematuria was secondary to UTI and referred pt to the [facility] for treatment of infection. Patient started on Fluconazole 8/4 x1 dose.  Continuing  warfarin inpatient.  8/6 - INR is therapeutic at 2.39. Continue PTA dose.  Goal of Therapy:  INR 2-3 Monitor platelets by anticoagulation protocol: Yes   Plan:  - warfarin 5mg  PO x1 - daily INR - monitor for s/s bleeding especially for severity of hematuria  [name], PharmD PGY1 Pharmacy Resident Direct Phone: [phone] 11/01/2017  9:53 AM

## 2017-11-01 NOTE — Care Management Important Message (Signed)
Important Message  Patient Details  Name: CLEVLAND CORK MRN: [id number] Date of Birth: [birth year]   Medicare Important Message Given:  Yes    Teresita Fanton 11/01/2017, 4:50 PM

## 2017-11-01 NOTE — Progress Notes (Signed)
PROGRESS NOTE    Alexander [name]  WUJ:[id number] DOB: January 21, 1941 DOA: 10/28/2017 PCP: Deland Pretty, MD    Brief Narrative:  Alexander [name] is a 77 y.o. male with medical history significant of ESRD on dialysis on M/W/F, history of a stroke, status post TAVR, permanent atrial fibrillation on pacemaker, on Coumadin, recurrent urinary tract infection who was sent by ID from the office to the emergency department for admission. As per the ID, patient urine culture grew Enterococcus faecalis which was resistant to most of the antibiotics. Since last 4 weeks, patient was having dysuria, burning sensation and hematuria.  Though he is on dialysis, he produces little bit of urine and he has been having increased frequency of urination. Patient will be admitted to [facility] for more work-up and IV antibiotics and possible dialysis on Monday. He was started on IV vancomycin and ceftazidime, and urine cultures sent.     Assessment & Plan:   Principal Problem:   Failure to thrive syndrome, adult Active Problems:   Anemia, unspecified   ESRD (end stage renal disease) on dialysis (HCC)   Hyperlipidemia   Persistent atrial fibrillation (HCC)   Long term (current) use of anticoagulants [Z79.01]   UTI (urinary tract infection)   Hematuria   Balanitis   Urinary tract infection: outpatient urine cultures growing drug resistant Enterococcus fecalis. Pt was started on  IV vancomycin and ceftazidime and repeat cultures show 80,000 Ecoli, pan sensitive. The ceftazidime was discontinued and one dose of fosfomycin ordered by ID. One dose of IV diflucan ordered.  Pt denies any abdominal pain or flank pain, but continues to report some dysuria.  He has been afebrile and there is no leukocytosis.  CT abdomen and pelvis showed Possible slight rectal prolapse with prominence of the soft tissues at the anus. This could be due to hemorrhoids but I can't exclude a mass. Small enhancing lesion in the right posterolateral  aspect of the bladder as well as slight enhancement of the mucosa of the dome of the bladder. Possibility of bladder neoplasm should be considered.  Extensive diverticulosis of the sigmoid portion of the colon without discrete diverticulitis. Spoke to Dr Jeffie Pollock who will see the patient next Thursday in the office for evaluation of the bladder lesions.  Will need referral to GI for evaluation of rectal prolapse vs mass. He will need a colonoscopy of a sigmoidoscopy.     ESRD on HD.  Nephrology aware of the pts HD on MWF.   H/o Streptococcal bovis bacteremia: TTE ordered by ID for evaluation of endocarditis. It does not show any vegetations.  The bovis bacteremia was one year ago.    ? Confusion / Acute Encephalopathy , very transient , resolved by the time I examined him.: once prior to admission when he had UTI, once when he was hypoglycemic  and today around 2 pm.   pt has a h/o stroke more than 10 year ago. He has a pacemaker placed 3 years ago which is not MRI compatible.  Stat CT head without contrast ordered. EEG ordered. Neurology consulted.    Failure to thrive: unclear etiology.  TTE is negative for endocarditis.  CT abd and pelvis ordered to evaluate for malignancy. It shows enhancement of the bladder mucosa and a lesion in the posterolateral part of the bladder suspicious for malignancy. Urology consulted and Dr Jeffie Pollock will follow him as outpatient in the office for cystoscopy.  Dr Jeffie Pollock suggested preventive antibiotics like keflex for two weeks until the cystoscopy.  Hematuria;  Outpatient follow up with urology. Appears to have resolved.    Atrial fibrillation, chronic On dig and cardizem.  On coumadin , .  INR therapeutic.   Hyperlipidemia: Resume statin.   Anemia of chronic disease:  Secondary to ESRD.    Hypokalemia: replaced.   Hyperlipidemia: Resume provachol.    Chronic osteoarthritis: Resume prednisone.      DVT prophylaxis:coumadin.  Code  Status: full code.  Family Communication: daughter and spouse  at bedside, discussed the plan of care with the patient and family.  Disposition Plan: pending clinical improvement.   Consultants:   Nephrology for HD.   ID Dr Baxter Flattery.   Neurology Dr Leonel Ramsay.   Procedures: Hemodialysis.  CT head without contrast.    Antimicrobials:  Vancomycin and ceftazidime since admission till 8/4  Subjective: Around 2 pm   Objective: Vitals:   10/31/17 2146 11/01/17 0510 11/01/17 0901 11/01/17 1320  BP: 134/66 (!) 148/69 (!) 141/71 (!) 151/66  Pulse: 63 66 61 62  Resp: 18 14 14 14   Temp: 97.6 F (36.4 C) 98 F (36.7 C)    TempSrc: Oral Oral    SpO2: 95% 94%  97%  Weight:      Height:        Intake/Output Summary (Last 24 hours) at 11/01/2017 1424 Last data filed at 11/01/2017 0400 Gross per 24 hour  Intake 0 ml  Output 1502 ml  Net -1502 ml   Filed Weights   10/28/17 2101 10/31/17 1340 10/31/17 1743  Weight: 87.8 kg (193 lb 9 oz) 89.8 kg (197 lb 15.6 oz) 88.3 kg (194 lb 10.7 oz)    Examination:  General exam: alert and comfortable.  Respiratory system: clear to auscultation, no wheezing or rhonchi.  Cardiovascular system: S1 & S2 heard, RRR. No JVD. No pedal edema. Gastrointestinal system: Abdomen is soft NT ND BS+ Central nervous system: Alert and oriented. Able to move all extremities, able to stand and walk , no new sensory deficits.  Strength at baseline and normal.  Extremities: no pedal edema, no cyanosis or clubbing.  Skin: No rashes, lesions or ulcers Psychiatry:Mood & affect appropriate.     Data Reviewed: I have personally reviewed following labs and imaging studies  CBC: Recent Labs  Lab 10/28/17 1428 10/29/17 0824 10/31/17 1119  WBC 9.5 8.2 11.7*  NEUTROABS 7.4  --   --   HGB 11.9* 11.2* 12.1*  HCT 37.6* 36.3* 39.1  MCV 102.5* 102.5* 102.9*  PLT 200 172 935   Basic Metabolic Panel: Recent Labs  Lab 10/28/17 1428 10/29/17 0824 10/31/17 1119    NA 141 141 137  K 3.4* 4.0 4.4  CL 96* 97* 94*  CO2 32 31 27  GLUCOSE 143* 139* 181*  BUN 16 24* 55*  CREATININE 3.53* 5.61* 10.12*  CALCIUM 8.7* 8.9 9.2   GFR: Estimated Creatinine Clearance: 6.8 mL/min (A) (by C-G formula based on SCr of 10.12 mg/dL (H)). Liver Function Tests: Recent Labs  Lab 10/28/17 1428  AST 23  ALT 24  ALKPHOS 68  BILITOT 0.5  PROT 7.0  ALBUMIN 3.5   No results for input(s): LIPASE, AMYLASE in the last 168 hours. Recent Labs  Lab 10/28/17 1428  AMMONIA 41*   Coagulation Profile: Recent Labs  Lab 10/28/17 1428 10/29/17 0824 10/30/17 0416 10/31/17 0457 11/01/17 0501  INR 2.35 2.00 2.30 2.54 2.39   Cardiac Enzymes: No results for input(s): CKTOTAL, CKMB, CKMBINDEX, TROPONINI in the last 168 hours. BNP (last 3 results) Recent  Labs    11/11/16 1143  PROBNP 18,028*   HbA1C: No results for input(s): HGBA1C in the last 72 hours. CBG: Recent Labs  Lab 11/01/17 1322  GLUCAP 141*   Lipid Profile: No results for input(s): CHOL, HDL, LDLCALC, TRIG, CHOLHDL, LDLDIRECT in the last 72 hours. Thyroid Function Tests: No results for input(s): TSH, T4TOTAL, FREET4, T3FREE, THYROIDAB in the last 72 hours. Anemia Panel: No results for input(s): VITAMINB12, FOLATE, FERRITIN, TIBC, IRON, RETICCTPCT in the last 72 hours. Sepsis Labs: Recent Labs  Lab 10/28/17 1435 10/28/17 1710 10/29/17 0824  LATICACIDVEN 2.02* 1.19 1.6    Recent Results (from the past 240 hour(s))  Culture, blood (routine x 2)     Status: None (Preliminary result)   Collection Time: 10/28/17  2:03 PM  Result Value Ref Range Status   Specimen Description   Final    BLOOD RIGHT HAND Performed at [facility] [address] [place], [place] [address]    Special Requests   Final    BOTTLES DRAWN AEROBIC AND ANAEROBIC Blood Culture results may not be optimal due to an inadequate volume of blood received in culture bottles Performed at [facility] [address] [place], [place] [address]    Culture   Final    NO GROWTH 4 DAYS Performed at [facility], [place] [address], [place], [place] [address]    Report Status PENDING  Incomplete  Culture, blood (routine x 2)     Status: None (Preliminary result)   Collection Time: 10/28/17  2:08 PM  Result Value Ref Range Status   Specimen Description   Final    BLOOD RIGHT ANTECUBITAL Performed at [facility] [address], [place], [place] [address]    Special Requests   Final    BOTTLES DRAWN AEROBIC AND ANAEROBIC Blood Culture adequate volume Performed at [facility] [address], [place], [place] [address]    Culture   Final    NO GROWTH 4 DAYS Performed at [facility], [place] [address] [place], [place] [address]    Report Status PENDING  Incomplete  Urine culture     Status: Abnormal   Collection Time: 10/28/17  2:42 PM  Result Value Ref Range Status   Specimen Description   Final    URINE, RANDOM Performed at [facility] [address] [place], [place] [address]    Special Requests   Final    NONE Performed at [facility], [place] [address] [place], [place] [address]    Culture 80,000 COLONIES/mL ESCHERICHIA COLI (A)  Final   Report Status 10/30/2017 FINAL  Final   Organism ID, Bacteria ESCHERICHIA COLI (A)  Final      Susceptibility   Escherichia coli - MIC*    AMPICILLIN 16 INTERMEDIATE Intermediate     CEFAZOLIN <=4 SENSITIVE Sensitive     CEFTRIAXONE <=1 SENSITIVE Sensitive     CIPROFLOXACIN <=0.25 SENSITIVE Sensitive     GENTAMICIN <=1 SENSITIVE Sensitive     IMIPENEM <=0.25 SENSITIVE Sensitive     NITROFURANTOIN <=16 SENSITIVE Sensitive     TRIMETH/SULFA <=20 SENSITIVE Sensitive     AMPICILLIN/SULBACTAM 4 SENSITIVE Sensitive     PIP/TAZO <=4 SENSITIVE Sensitive     Extended ESBL NEGATIVE Sensitive     * 80,000 COLONIES/mL ESCHERICHIA COLI    MRSA PCR Screening     Status: None   Collection Time: 10/29/17  6:05  AM  Result Value Ref Range Status   MRSA by PCR NEGATIVE NEGATIVE Final    Comment:        The GeneXpert MRSA Assay (FDA approved for NASAL specimens only), is one component of a comprehensive MRSA colonization surveillance program. It is not intended to diagnose MRSA infection nor to guide or monitor treatment for MRSA infections. Performed at [facility], [place] [address], [place], [place] [address]          Radiology Studies: Ct Abdomen Pelvis W Contrast  Result Date: 10/31/2017 CLINICAL DATA:  Rectal bleeding for 2 weeks. Unintended weight loss. Abdominal pain. EXAM: CT ABDOMEN AND PELVIS WITH CONTRAST TECHNIQUE: Multidetector CT imaging of the abdomen and pelvis was performed using the standard protocol following bolus administration of intravenous contrast. CONTRAST:  128mL OMNIPAQUE IOHEXOL 300 MG/ML  SOLN COMPARISON:  CT scan of the abdomen dated 08/24/2016 FINDINGS: Lower chest: Small left pleural effusion, minimally more prominent than on the study in 2018. Tiny right effusion. Aortic atherosclerosis. Hepatobiliary: No focal liver abnormality is seen. No gallstones, gallbladder wall thickening, or biliary dilatation. Pancreas: Unremarkable. No pancreatic ductal dilatation or surrounding inflammatory changes. Tiny benign calcifications in the pancreatic head and in the pancreatic tail, unchanged. Spleen: Normal in size without focal abnormality. Adrenals/Urinary Tract: 6 mm area of nodular enhancement at the inferior right posterolateral aspect of the bladder seen on image 80 of series 3. Subtle enhancement of the right side of the dome of the bladder best seen on image 70 of series 6 and image 93 of series 7. Severe bilateral renal atrophy with multiple bilateral renal cysts, unchanged. No hydronephrosis. Normal adrenal glands. Stomach/Bowel: Sigmoid diverticulosis. Suggestion of slight rectal prolapse with  prominence of the soft tissues at the anus. Otherwise negative. Vascular/Lymphatic: Extensive aortic atherosclerosis. No adenopathy. Reproductive: Focal cyst in the median lobe of the prostate gland, unchanged. Otherwise negative. Other: No abdominal wall hernia or abnormality. No abdominopelvic ascites. Musculoskeletal: Chronic severe degenerative disc disease throughout the lumbar spine. No acute abnormalities. IMPRESSION: 1. Suggestion of slight rectal prolapse with prominence of the soft tissues at the anus. This could be due to hemorrhoids but I can't exclude a mass. 2. Small enhancing lesion in the right posterolateral aspect of the bladder as well as slight enhancement of the mucosa of the dome of the bladder. Possibility of bladder neoplasm should be considered. 3. Extensive diverticulosis of the sigmoid portion of the colon without discrete diverticulitis. 4.  Aortic Atherosclerosis (ICD10-I70.0). 5. Small nonspecific left pleural effusion, minimally increased since 2018. Electronically Signed   By: Lorriane Shire M.D.   On: 10/31/2017 09:57        Scheduled Meds: . bromocriptine  5 mg Oral Q M,W,F  . cephALEXin  250 mg Oral Daily  . Chlorhexidine Gluconate Cloth  6 each Topical Q0600  . cinacalcet  60 mg Oral BID WC  . digoxin  62.5 mcg Oral Daily  . diltiazem  120 mg Oral Daily  . dorzolamide-timolol  1 drop Both Eyes BID  . doxercalciferol  3 mcg Intravenous Q M,W,F-HD  . feeding supplement (NEPRO CARB STEADY)  237 mL Oral BID BM  . multivitamin  1 tablet Oral QHS  . pantoprazole  40 mg Oral Daily  . polyethylene glycol  17 g Oral Daily  . pravastatin  10 mg Oral Daily  . predniSONE  5 mg Oral QHS  . sertraline  50 mg Oral Daily  . sevelamer carbonate  1,600 mg Oral  TID WC  . warfarin  5 mg Oral ONCE-1800  . Warfarin - Pharmacist Dosing Inpatient   Does not apply q1800   Continuous Infusions: . sodium chloride Stopped (10/29/17 0559)     LOS: 3 days    Time spent: 35  minutes.     Hosie Poisson, MD [facility] Pager [phone]  If 7PM-7AM, please contact night-coverage [email or web address] Password [email or web address] 11/01/2017, 2:24 PM

## 2017-11-01 NOTE — Progress Notes (Signed)
Pt unavailable for EEG at this time per RN, pt going to CT. Will attempt as schedule permits.

## 2017-11-01 NOTE — Consult Note (Signed)
Neurology Consultation  Reason for Consult: Transient altered mental status Referring Physician: Karleen Hampshire    History is obtained from: Family members and patient  HPI: Alexander [name] is a 77 y.o. male with history of diabetes, glaucoma, macular degeneration, legally blind and UTI.  Patient recently was hospitalized for urinary tract infection however this been treated at this time.  Patient was doing very well when suddenly today he was noticed to be talking and having a good sentence when he all of a sudden did not know where he was.  Family member told him that he was in the hospital he was able to recall that he was in the hospital for about 2 or 3 minutes and then asked same question.  This occurred for about 1 hour, then fully resolved and he is back to his baseline at this time.  There have been times when he is been confused in the past however they have been linked to both urinary tract infection and also periods in which his glucose was down in the 50s.  He has never had episodes where he asked same question over and over.  Patient has no history of seizures, migraines but has had CVA in the past.  Unfortunately cannot get an MRI because he is dual lead pacemaker which is not MRI compatible.   ROS: A 14 point ROS was performed and is negative except as noted in the HPI.   Past Medical History:  Diagnosis Date  . Anemia   . Aortic stenosis 06/15/12   TEE - EF 12-45%; grade 1 diastolic dysfunction; mild/mod aortic valve stenosis; Mitral valve had calcified annulus, mild pulm htn PA peak pressure 55mmHg  . Barrett's esophagus 05/2003  . Bradycardia 2017   [employer] 2240 Assurity dual-lead pacemaker  . Carpal tunnel syndrome, bilateral 11/03/2015  . Colon polyps   . CVA (cerebral infarction)    2004/affected left side  . Depression   . Diabetes mellitus without complication ([place])   . Diabetic peripheral neuropathy ([place]) 10/02/2015  . Diverticulosis   . Dyspnea    with exertion  .  ESRD (end stage renal disease) on dialysis ([place])    "[facility]; [facility]; MWF" (05/12/2017)  . Failure to thrive syndrome, adult 10/30/2017  . GERD (gastroesophageal reflux disease)   . Glaucoma   . History of kidney stones   . Hyperlipidemia   . Hypertension   . Legally blind   . Macular degeneration    both eyes  . Orthostatic hypotension 09/09/2015  . Paroxysmal atrial fibrillation ([facility])   . Peptic ulcer    bleeding, 1969  . Presence of permanent cardiac pacemaker   . S/P epidural steroid injection    last  injection over 10 years ago  . Seasonal allergies   . Tubular adenoma of colon 07/2001    Family History  Problem Relation Age of Onset  . Stomach cancer Mother   . Hypertension Father        Died of heart attack  . Heart attack Father   . Stroke Sister   . Heart disease Sister   . Cancer Brother   . Colon cancer Neg Hx     Social History:   reports that he quit smoking about 19 years ago. His smoking use included cigarettes. He has a 90.00 pack-year smoking history. He has never used smokeless tobacco. He reports that he does not drink alcohol or use drugs.  Medications  Current Facility-Administered Medications:  .  0.9 %  sodium chloride infusion, , Intravenous, PRN, Shelly Coss, MD, Stopped at 10/29/17 0559 .  acetaminophen (TYLENOL) tablet 975 mg, 975 mg, Oral, Daily PRN, Shelly Coss, MD, 975 mg at 10/31/17 1037 .  bromocriptine (PARLODEL) tablet 5 mg, 5 mg, Oral, Q M,W,F, Adhikari, Amrit, MD, 5 mg at 10/31/17 1036 .  cephALEXin (KEFLEX) capsule 250 mg, 250 mg, Oral, Daily, Hosie Poisson, MD .  Chlorhexidine Gluconate Cloth 2 % PADS 6 each, 6 each, Topical, Q0600, Valentina Gu, NP, 6 each at 11/01/17 0540 .  cinacalcet (SENSIPAR) tablet 60 mg, 60 mg, Oral, BID WC, Valentina Gu, NP, 60 mg at 11/01/17 0900 .  digoxin (LANOXIN) tablet 62.5 mcg, 62.5 mcg, Oral, Daily, Adhikari, Amrit, MD, 62.5 mcg at 11/01/17 0901 .  diltiazem (CARDIZEM CD) 24 hr  capsule 120 mg, 120 mg, Oral, Daily, Adhikari, Amrit, MD, 120 mg at 11/01/17 0904 .  dorzolamide-timolol (COSOPT) 22.3-6.8 MG/ML ophthalmic solution 1 drop, 1 drop, Both Eyes, BID, Adhikari, Amrit, MD, 1 drop at 11/01/17 0905 .  doxercalciferol (HECTOROL) injection 3 mcg, 3 mcg, Intravenous, Q M,W,F-HD, Valentina Gu, NP .  feeding supplement (NEPRO CARB STEADY) liquid 237 mL, 237 mL, Oral, BID BM, Valentina Gu, NP, 237 mL at 11/01/17 0901 .  multivitamin (RENA-VIT) tablet 1 tablet, 1 tablet, Oral, QHS, Valentina Gu, NP, 1 tablet at 10/31/17 2215 .  pantoprazole (PROTONIX) EC tablet 40 mg, 40 mg, Oral, Daily, Adhikari, Amrit, MD, 40 mg at 11/01/17 0904 .  polyethylene glycol (MIRALAX / GLYCOLAX) packet 17 g, 17 g, Oral, Daily, Adhikari, Amrit, MD, 17 g at 11/01/17 0904 .  pravastatin (PRAVACHOL) tablet 10 mg, 10 mg, Oral, Daily, Adhikari, Amrit, MD, 10 mg at 11/01/17 0904 .  predniSONE (DELTASONE) tablet 5 mg, 5 mg, Oral, QHS, Adhikari, Amrit, MD, 5 mg at 10/31/17 2215 .  sertraline (ZOLOFT) tablet 50 mg, 50 mg, Oral, Daily, Adhikari, Amrit, MD, 50 mg at 11/01/17 0904 .  sevelamer carbonate (RENVELA) tablet 1,600 mg, 1,600 mg, Oral, TID WC, Valentina Gu, NP, 1,600 mg at 11/01/17 0900 .  warfarin (COUMADIN) tablet 5 mg, 5 mg, Oral, ONCE-1800, Hosie Poisson, MD .  Warfarin - Pharmacist Dosing Inpatient, , Does not apply, q1800, Pham, Anh P, RPH   Exam: Current vital signs: BP (!) 151/66 (BP Location: Right Arm)   Pulse 62   Temp 98 F (36.7 C) (Oral)   Resp 14   Ht 5\' 10"  (1.778 m)   Wt 88.3 kg (194 lb 10.7 oz)   SpO2 97%   BMI 27.93 kg/m  Vital signs in last 24 hours: Temp:  [97.5 F (36.4 C)-98 F (36.7 C)] 98 F (36.7 C) (08/06 0510) Pulse Rate:  [60-66] 62 (08/06 1320) Resp:  [14-18] 14 (08/06 1320) BP: (114-151)/(53-71) 151/66 (08/06 1320) SpO2:  [94 %-98 %] 97 % (08/06 1320) Weight:  [88.3 kg (194 lb 10.7 oz)] 88.3 kg (194 lb 10.7 oz) (08/05  1743)  GENERAL: Awake, alert in NAD HEENT: - Normocephalic and atraumatic, dry mm,  Ext: warm, well perfused, intact peripheral pulses,  NEURO:  Mental Status: AA&Ox3, speech is fluent.   repetition, fluency, and comprehension intact. Cranial Nerves: Right pupil is 2 mm and nonreactive secondary to severe macular degeneration and blindness, left pupil is 2 mm and sluggishly reactive as he is still able to have peripheral vision however though very limited. EOMI, with left eye he can recognize where my hand is in his peripheral vision. no facial asymmetry, facial  sensation intact, hearing intact, tongue/uvula/soft palate midline,  Motor: 5/5 throughout Tone: is normal and bulk is normal Sensation- Intact to light touch bilaterally--decreased sensation in the stocking distribution Coordination: FTN intact bilaterally, I did note that he had dysmetria with heel-to-shin with his right leg but not on his left leg Gait- deferred  Labs I have reviewed labs in epic and the results pertinent to this consultation are:   CBC    Component Value Date/Time   WBC 11.7 (H) 10/31/2017 1119   RBC 3.80 (L) 10/31/2017 1119   HGB 12.1 (L) 10/31/2017 1119   HGB 11.2 (L) 08/19/2016 0923   HCT 39.1 10/31/2017 1119   HCT 34.5 (L) 08/19/2016 0923   PLT 213 10/31/2017 1119   PLT 186 08/19/2016 0923   MCV 102.9 (H) 10/31/2017 1119   MCV 102 (H) 08/19/2016 0923   MCH 31.8 10/31/2017 1119   MCHC 30.9 10/31/2017 1119   RDW 18.6 (H) 10/31/2017 1119   RDW 17.5 (H) 08/19/2016 0923   LYMPHSABS 1.4 10/28/2017 1428   MONOABS 0.5 10/28/2017 1428   EOSABS 0.1 10/28/2017 1428   BASOSABS 0.1 10/28/2017 1428    CMP     Component Value Date/Time   NA 137 10/31/2017 1119   NA 144 08/23/2017 0916   K 4.4 10/31/2017 1119   CL 94 (L) 10/31/2017 1119   CO2 27 10/31/2017 1119   GLUCOSE 181 (H) 10/31/2017 1119   BUN 55 (H) 10/31/2017 1119   BUN 32 (H) 08/23/2017 0916   CREATININE 10.12 (H) 10/31/2017 1119    CREATININE 3.61 (H) 01/02/2016 1312   CALCIUM 9.2 10/31/2017 1119   PROT 7.0 10/28/2017 1428   PROT 5.9 (L) 08/23/2017 0916   ALBUMIN 3.5 10/28/2017 1428   ALBUMIN 3.8 08/23/2017 0916   AST 23 10/28/2017 1428   ALT 24 10/28/2017 1428   ALKPHOS 68 10/28/2017 1428   BILITOT 0.5 10/28/2017 1428   BILITOT 0.6 08/23/2017 0916   GFRNONAA 4 (L) 10/31/2017 1119   GFRAA 5 (L) 10/31/2017 1119    Lipid Panel     Component Value Date/Time   CHOL 147 08/23/2017 0916   TRIG 236 (H) 08/23/2017 0916   HDL 26 (L) 08/23/2017 0916   CHOLHDL 5.7 (H) 08/23/2017 0916   CHOLHDL 7.5 05/14/2017 0514   VLDL 55 (H) 05/14/2017 0514   LDLCALC 74 08/23/2017 0916     Imaging I have reviewed the images obtained:  CT-scan of the brain IMPRESSION: Old left occipital infarction. Mild diffuse cortical atrophy. Mild chronic ischemic white matter disease. No acute intracranial abnormality seen.   MRI examination of the brain--unable to obtain secondary to pacemaker  EEG reading pending  Assessment: At this point given the repetition of same question over and over that lasted for approximately 1 hour most likely diagnosis would be transient global amnesia.  We will also obtain EEG to rule out any form of seizures seeing that he has had a stroke in the past.  Given his urinary tract infection has cleared and he was baseline prior to this I do not believe it was his UTI.   Recommendations: -EEG    @SIGNATUREAA @

## 2017-11-01 NOTE — Progress Notes (Signed)
Pt was alert and oriented all morning, MD and family at bedside in the morning, discharge orders to home were placed by Karleen Hampshire, MD. After RN removed IV from pt and dressed pt to go home, pt became severely confused, unable to tell time or location. RN paged Karleen Hampshire, MD and Rapid Response, both will come to bedside when available. Obtained Vital signs and CBG. CBG 141. Neuro reassessment complete. Will continue to monitor pt.    Update: Head CT and EEG ordered.

## 2017-11-01 NOTE — Progress Notes (Addendum)
[facility] Progress Note   Subjective: Up in chair, no complaints. Hopefully DC home today.   Objective Vitals:   10/31/17 1743 10/31/17 2146 11/01/17 0510 11/01/17 0901  BP: 117/60 134/66 (!) 148/69 (!) 141/71  Pulse: 60 63 66 61  Resp: 18 18 14    Temp: (!) 97.5 F (36.4 C) 97.6 F (36.4 C) 98 F (36.7 C)   TempSrc: Oral Oral Oral   SpO2: 98% 95% 94%   Weight: 88.3 kg (194 lb 10.7 oz)     Height:       Physical Exam General: Pleasant, elderly male in NAD Heart: S1,S2 regularly irregular Lungs: CTAB A/P Abdomen: Active BS Extremities:No LE edema Dialysis Access: LFA AVF + bruit   Additional Objective Labs: Basic Metabolic Panel: Recent Labs  Lab 10/28/17 1428 10/29/17 0824 10/31/17 1119  NA 141 141 137  K 3.4* 4.0 4.4  CL 96* 97* 94*  CO2 32 31 27  GLUCOSE 143* 139* 181*  BUN 16 24* 55*  CREATININE 3.53* 5.61* 10.12*  CALCIUM 8.7* 8.9 9.2   Liver Function Tests: Recent Labs  Lab 10/28/17 1428  AST 23  ALT 24  ALKPHOS 68  BILITOT 0.5  PROT 7.0  ALBUMIN 3.5   No results for input(s): LIPASE, AMYLASE in the last 168 hours. CBC: Recent Labs  Lab 10/28/17 1428 10/29/17 0824 10/31/17 1119  WBC 9.5 8.2 11.7*  NEUTROABS 7.4  --   --   HGB 11.9* 11.2* 12.1*  HCT 37.6* 36.3* 39.1  MCV 102.5* 102.5* 102.9*  PLT 200 172 213   Blood Culture    Component Value Date/Time   SDES  10/28/2017 1442    URINE, RANDOM Performed at [facility], [place] [address], [place], [place] [address]    SPECREQUEST  10/28/2017 1442    NONE Performed at [facility], [place] [address] [place], [place] [address]    CULT 80,000 COLONIES/mL ESCHERICHIA COLI (A) 10/28/2017 1442   REPTSTATUS 10/30/2017 FINAL 10/28/2017 1442    Cardiac Enzymes: No results for input(s): CKTOTAL, CKMB, CKMBINDEX, TROPONINI in the last 168 hours. CBG: No results for input(s): GLUCAP in the last 168 hours. Iron Studies: No results for  input(s): IRON, TIBC, TRANSFERRIN, FERRITIN in the last 72 hours. @lablastinr3 @ Studies/Results: Ct Abdomen Pelvis W Contrast  Result Date: 10/31/2017 CLINICAL DATA:  Rectal bleeding for 2 weeks. Unintended weight loss. Abdominal pain. EXAM: CT ABDOMEN AND PELVIS WITH CONTRAST TECHNIQUE: Multidetector CT imaging of the abdomen and pelvis was performed using the standard protocol following bolus administration of intravenous contrast. CONTRAST:  164mL OMNIPAQUE IOHEXOL 300 MG/ML  SOLN COMPARISON:  CT scan of the abdomen dated 08/24/2016 FINDINGS: Lower chest: Small left pleural effusion, minimally more prominent than on the study in 2018. Tiny right effusion. Aortic atherosclerosis. Hepatobiliary: No focal liver abnormality is seen. No gallstones, gallbladder wall thickening, or biliary dilatation. Pancreas: Unremarkable. No pancreatic ductal dilatation or surrounding inflammatory changes. Tiny benign calcifications in the pancreatic head and in the pancreatic tail, unchanged. Spleen: Normal in size without focal abnormality. Adrenals/Urinary Tract: 6 mm area of nodular enhancement at the inferior right posterolateral aspect of the bladder seen on image 80 of series 3. Subtle enhancement of the right side of the dome of the bladder best seen on image 70 of series 6 and image 93 of series 7. Severe bilateral renal atrophy with multiple bilateral renal cysts, unchanged. No hydronephrosis. Normal adrenal glands. Stomach/Bowel: Sigmoid diverticulosis. Suggestion of slight rectal prolapse  with prominence of the soft tissues at the anus. Otherwise negative. Vascular/Lymphatic: Extensive aortic atherosclerosis. No adenopathy. Reproductive: Focal cyst in the median lobe of the prostate gland, unchanged. Otherwise negative. Other: No abdominal wall hernia or abnormality. No abdominopelvic ascites. Musculoskeletal: Chronic severe degenerative disc disease throughout the lumbar spine. No acute abnormalities. IMPRESSION: 1.  Suggestion of slight rectal prolapse with prominence of the soft tissues at the anus. This could be due to hemorrhoids but I can't exclude a mass. 2. Small enhancing lesion in the right posterolateral aspect of the bladder as well as slight enhancement of the mucosa of the dome of the bladder. Possibility of bladder neoplasm should be considered. 3. Extensive diverticulosis of the sigmoid portion of the colon without discrete diverticulitis. 4.  Aortic Atherosclerosis (ICD10-I70.0). 5. Small nonspecific left pleural effusion, minimally increased since 2018. Electronically Signed   By: Lorriane Shire M.D.   On: 10/31/2017 09:57   Medications: . sodium chloride Stopped (10/29/17 0559)   . bromocriptine  5 mg Oral Q M,W,F  . Chlorhexidine Gluconate Cloth  6 each Topical Q0600  . cinacalcet  60 mg Oral BID WC  . digoxin  62.5 mcg Oral Daily  . diltiazem  120 mg Oral Daily  . dorzolamide-timolol  1 drop Both Eyes BID  . doxercalciferol  3 mcg Intravenous Q M,W,F-HD  . feeding supplement (NEPRO CARB STEADY)  237 mL Oral BID BM  . multivitamin  1 tablet Oral QHS  . pantoprazole  40 mg Oral Daily  . polyethylene glycol  17 g Oral Daily  . pravastatin  10 mg Oral Daily  . predniSONE  5 mg Oral QHS  . sertraline  50 mg Oral Daily  . sevelamer carbonate  1,600 mg Oral TID WC  . warfarin  5 mg Oral ONCE-1800  . Warfarin - Pharmacist Dosing Inpatient   Does not apply q1800     Dialysis:[facility] MWF 4h  87 kg 2/2 bath   L AVF   Hep 2800 -Hectorol 3 mcg IV TIW -Mircera 200 mcg IV q 2 weeks (last dose 10/26/17 Last HGB 11 10/26/17)  Assessment/Plan: 1. UTI vs Balanitis-per primary-ID following. Urine culture positive for Ecoli 80000 C/MLs. BC NG 24 hrs.CT of pelvis done this AM revealed small lesion R posteriolateral aspect of bladder. No antibiotics at present. To follow up with Dr. Roni Bread (urology) as OP.  2. ESRD on HD MWF via AVF. HD today on schedule. 3. Hypertension/volume-HD yesterday. Pre  wt 89.8 kg net UF 1.5 Post wt 88.3 kg. Slightly above OP EDW, no evidence of volume overload, BP controlled.   4. Anemia - HGB 12.1 No ESA needed. 5. Metabolic bone disease - Continue binders, sensipar and VDRA.  6. Nutrition - Renal diet, renal vit, nepro 7. H/O TAVR 2018 8. H/O Streptococcus gallyolyticus bacteremia in setting TAVR/PPM 9. Afib-on coumadin. Rate controlled with cardizem.    Alexander H. Brown NP-C 11/01/2017, 10:51 AM  [facility] [phone]  Pt seen, examined and agree w A/P as above.  Got confused again today, will plan HD here tomorrow.  Kelly Splinter MD [employer] pager [phone]   11/01/2017, 2:52 PM

## 2017-11-01 NOTE — Significant Event (Signed)
Rapid Response Event Note  Overview:  Received call from bedside RN d/t patient who was in the process of discharging (IVs out, going over AVS papers) became acutely confused. RN stated she was in the process of getting vital signs and blood sugar. Other than confusion, patient appears stable. Stated I would come as soon as possible as I was in another call. Notified to call MD with changes.    Initial Focused Assessment: Arrived at 1400. Patient sitting in recliner chair fully dressed. Patient has no facial droop, no language deficits, MAE equally. No drift noted, very strong extremities and follows all commands. Patient oriented to self and location. Just don't know what day it is, maybe Saturday/Sunday. Informed by family that Dr Karleen Hampshire just left and ordered a head CT. Vital signs and blood sugar within acceptable limits.  Interventions: Stat head CT ordered by Dr Karleen Hampshire  Plan of Care (if not transferred): Plan per Dr Karleen Hampshire Call as needed Event Summary:  Called at 1316      Arrived at [facility] at [place]

## 2017-11-02 ENCOUNTER — Inpatient Hospital Stay (HOSPITAL_COMMUNITY): Payer: Medicare Other

## 2017-11-02 LAB — RENAL FUNCTION PANEL
Albumin: 3.1 g/dL — ABNORMAL LOW (ref 3.5–5.0)
Anion gap: 15 (ref 5–15)
BUN: 33 mg/dL — ABNORMAL HIGH (ref 8–23)
CO2: 28 mmol/L (ref 22–32)
Calcium: 8.5 mg/dL — ABNORMAL LOW (ref 8.9–10.3)
Chloride: 96 mmol/L — ABNORMAL LOW (ref 98–111)
Creatinine, Ser: 7.61 mg/dL — ABNORMAL HIGH (ref 0.61–1.24)
GFR calc Af Amer: 7 mL/min — ABNORMAL LOW (ref >60)
GFR calc non Af Amer: 6 mL/min — ABNORMAL LOW (ref >60)
Glucose, Bld: 150 mg/dL — ABNORMAL HIGH (ref 70–99)
Phosphorus: 4.6 mg/dL (ref 2.5–4.6)
Potassium: 4.1 mmol/L (ref 3.5–5.1)
Sodium: 139 mmol/L (ref 135–145)

## 2017-11-02 LAB — CBC
HCT: 36.9 % — ABNORMAL LOW (ref 39.0–52.0)
Hemoglobin: 11.1 g/dL — ABNORMAL LOW (ref 13.0–17.0)
MCH: 31.5 pg (ref 26.0–34.0)
MCHC: 30.1 g/dL (ref 30.0–36.0)
MCV: 104.8 fL — ABNORMAL HIGH (ref 78.0–100.0)
Platelets: 191 K/uL (ref 150–400)
RBC: 3.52 MIL/uL — ABNORMAL LOW (ref 4.22–5.81)
RDW: 18.8 % — ABNORMAL HIGH (ref 11.5–15.5)
WBC: 10.2 K/uL (ref 4.0–10.5)

## 2017-11-02 LAB — PROTIME-INR
INR: 2.02
Prothrombin Time: 22.7 seconds — ABNORMAL HIGH (ref 11.4–15.2)

## 2017-11-02 LAB — CULTURE, BLOOD (ROUTINE X 2)
Culture: NO GROWTH
Culture: NO GROWTH
Special Requests: ADEQUATE

## 2017-11-02 MED ORDER — WARFARIN SODIUM 3 MG PO TABS
3.0000 mg | ORAL_TABLET | Freq: Once | ORAL | Status: DC
  Filled 2017-11-02: qty 1

## 2017-11-02 MED ORDER — SEVELAMER CARBONATE 800 MG PO TABS: 1600.0000 mg | ORAL_TABLET | Freq: Three times a day (TID) | ORAL | 0 refills | Status: AC

## 2017-11-02 MED ORDER — HEPARIN SODIUM (PORCINE) 1000 UNIT/ML DIALYSIS: 2800.0000 [IU] | Freq: Once | INTRAMUSCULAR | Status: DC

## 2017-11-02 MED ORDER — CINACALCET HCL 30 MG PO TABS: 60.0000 mg | ORAL_TABLET | Freq: Two times a day (BID) | ORAL | 0 refills | Status: DC

## 2017-11-02 MED ORDER — DOXERCALCIFEROL 4 MCG/2ML IV SOLN
INTRAVENOUS | Status: AC
  Administered 2017-11-02: 3 ug via INTRAVENOUS
  Filled 2017-11-02: qty 2

## 2017-11-02 NOTE — Progress Notes (Signed)
[facility] Progress Note   Subjective: seen on HD, alert and OX3, no c/o  Objective Vitals:   11/02/17 0830 11/02/17 0900 11/02/17 0930 11/02/17 1000  BP: (!) 159/67 (!) 158/70 (!) 158/69 (!) 147/72  Pulse: 63 63 63 63  Resp:      Temp:      TempSrc:      SpO2:      Weight:      Height:       Physical Exam General: Pleasant, elderly male in NAD, Ox 3 Heart: S1,S2 regularly irregular Lungs: CTAB A/P Abdomen: Active BS Extremities:No LE edema Dialysis Access: LFA AVF + bruit   Additional Objective Labs: Basic Metabolic Panel: Recent Labs  Lab 10/31/17 1119 11/01/17 1605 11/02/17 0434  NA 137 140 139  K 4.4 3.8 4.1  CL 94* 98 96*  CO2 27 29 28   GLUCOSE 181* 107* 150*  BUN 55* 27* 33*  CREATININE 10.12* 6.73* 7.61*  CALCIUM 9.2 8.6* 8.5*  PHOS  --   --  4.6   Liver Function Tests: Recent Labs  Lab 10/28/17 1428 11/02/17 0434  AST 23  --   ALT 24  --   ALKPHOS 68  --   BILITOT 0.5  --   PROT 7.0  --   ALBUMIN 3.5 3.1*   No results for input(s): LIPASE, AMYLASE in the last 168 hours. CBC: Recent Labs  Lab 10/28/17 1428 10/29/17 0824 10/31/17 1119 11/01/17 1605 11/02/17 0434  WBC 9.5 8.2 11.7* 10.5 10.2  NEUTROABS 7.4  --   --   --   --   HGB 11.9* 11.2* 12.1* 11.5* 11.1*  HCT 37.6* 36.3* 39.1 36.4* 36.9*  MCV 102.5* 102.5* 102.9* 104.9* 104.8*  PLT 200 172 213 207 191   Blood Culture    Component Value Date/Time   SDES  10/28/2017 1442    URINE, RANDOM Performed at [facility], [place] [address], [place], [place] [address]    SPECREQUEST  10/28/2017 1442    NONE Performed at [facility], [place] [address], [place], [place] [address]    CULT 80,000 COLONIES/mL ESCHERICHIA COLI (A) 10/28/2017 1442   REPTSTATUS 10/30/2017 FINAL 10/28/2017 1442    Cardiac Enzymes: No results for input(s): CKTOTAL, CKMB, CKMBINDEX, TROPONINI in the last 168 hours. CBG: Recent Labs  Lab 11/01/17 1322   GLUCAP 141*   Iron Studies: No results for input(s): IRON, TIBC, TRANSFERRIN, FERRITIN in the last 72 hours. @lablastinr3 @ Studies/Results: Ct Head Wo Contrast  Result Date: 11/01/2017 CLINICAL DATA:  Altered level of consciousness. EXAM: CT HEAD WITHOUT CONTRAST TECHNIQUE: Contiguous axial images were obtained from the base of the skull through the vertex without intravenous contrast. COMPARISON:  CT 05/12/2017 FINDINGS: Brain: Large area of encephalomalacia involving the temporal, parietal, and occipital lobe. There is mild central and cortical atrophy. Periventricular white matter changes are consistent with small vessel disease. There is no intra or extra-axial fluid collection or mass lesion. The basilar cisterns and ventricles have a normal appearance. There is no CT evidence for acute infarction or hemorrhage. Vascular: There is atherosclerotic calcifications of the carotic siphons. Skull: Normal. Negative for fracture or focal lesion. Sinuses/Orbits: Minimal mucosal thickening of the paranasal sinuses. Other: There is cerumen within the bilateralexternal auditory canals. IMPRESSION: 1. Stable encephalomalacia involving the LEFT PCA territory. 2.  No evidence for acute intracranial abnormality. Electronically Signed   By: Nolon Nations M.D.   On: 11/01/2017 16:10   Medications: . sodium  chloride Stopped (10/29/17 0559)   . bromocriptine  5 mg Oral Q M,W,F  . cephALEXin  250 mg Oral Daily  . Chlorhexidine Gluconate Cloth  6 each Topical Q0600  . Chlorhexidine Gluconate Cloth  6 each Topical Q0600  . cinacalcet  60 mg Oral BID WC  . digoxin  62.5 mcg Oral Daily  . diltiazem  120 mg Oral Daily  . dorzolamide-timolol  1 drop Both Eyes BID  . doxercalciferol  3 mcg Intravenous Q M,W,F-HD  . feeding supplement (NEPRO CARB STEADY)  237 mL Oral BID BM  . [START ON 11/03/2017] heparin  2,800 Units Dialysis Once in dialysis  . multivitamin  1 tablet Oral QHS  . pantoprazole  40 mg Oral Daily   . polyethylene glycol  17 g Oral Daily  . pravastatin  10 mg Oral Daily  . predniSONE  5 mg Oral QHS  . sertraline  50 mg Oral Daily  . sevelamer carbonate  1,600 mg Oral TID WC  . Warfarin - Pharmacist Dosing Inpatient   Does not apply q1800     Dialysis:[facility] MWF 4h  87 kg 2/2 bath   L AVF   Hep 2800 -Hectorol 3 mcg IV TIW -Mircera 200 mcg IV q 2 weeks (last dose 10/26/17 Last HGB 11 10/26/17)  Assessment/Plan: 1. UTI - urine culture positive for Ecoli 80000 C/MLs. BC NG 24 hrs.CT of pelvis done this AM revealed small lesion R posteriolateral aspect of bladder. Completed abx. Follow up with Dr. Roni Bread (urology) as OP.  2. ESRD on HD MWF via AVF.  HD today.  3. Hypertension/volume-HD yesterday. Pre wt 89.8 kg net UF 1.5 Post wt 88.3 kg. Slightly above OP EDW, no evidence of volume overload, BP controlled.   4. Anemia - HGB 12.1 No ESA needed. 5. Metabolic bone disease - Continue binders, sensipar and VDRA.  6. Nutrition - Renal diet, renal vit, nepro 7. H/O TAVR 2018 8. H/O prior Streptococcus gallyolyticus bacteremia in setting TAVR/PPM 9. Afib-on coumadin. Rate controlled with cardizem.    Kelly Splinter MD [employer] pager [phone]   11/02/2017, 10:32 AM

## 2017-11-02 NOTE — Progress Notes (Signed)
Subjective: Patient currently in dialysis.  Comfortable listening to books on tape.  States he has had no further episodes.  Attempted to walk back to his 5w07 room to talk to family however nobody was in the room.  Exam: Vitals:   11/02/17 0900 11/02/17 0930  BP: (!) 158/70 (!) (P) 158/69  Pulse: 63 (P) 63  Resp:    Temp:    SpO2:      Physical Exam   HEENT-  Normocephalic, no lesions, without obvious abnormality.  Normal external eye and conjunctiva.   Extremities- Warm, dry and intact Musculoskeletal-no joint tenderness, deformity or swelling Skin-warm and dry, no hyperpigmentation, vitiligo, or suspicious lesions    Neuro:  Mental Status: Alert, oriented, thought content appropriate.  Speech fluent without evidence of aphasia.  Able to follow 3 step commands without difficulty. Cranial Nerves: II:  Right pupil is 2 mm and nonreactive secondary to severe macular degeneration and blindness, left pupil is 2 mm and sluggishly reactive as he is still able to have peripheral vision however though very limited. EOMI, with left eye he can recognize where my hand is in his peripheral vision. III,IV, VI: ptosis not present, extra-ocular motions intact bilaterally pupils equal, round, reactive to light and accommodation V,VII: smile symmetric, facial light touch sensation normal bilaterally VIII: hearing normal bilaterally IX,X: uvula rises midline XI: bilateral shoulder shrug XII: midline tongue extension Motor: Right : Upper extremity   5/5    Left:     Upper extremity   5/5  Lower extremity   5/5     Lower extremity   5/5 Tone and bulk:normal tone throughout; no atrophy noted Sensory: Pinprick and light touch intact throughout, bilaterally-and in upper extremities however lower extremities have stocking distribution of peripheral neuropathy Deep Tendon Reflexes: 2+ and symmetric throughout with no ankle jerk Plantars: Right: downgoing   Left: downgoing Cerebellar: normal  finger-to-nose, normal rapid alternating movements and normal heel-to-shin test      Medications:  Scheduled: . bromocriptine  5 mg Oral Q M,W,F  . cephALEXin  250 mg Oral Daily  . Chlorhexidine Gluconate Cloth  6 each Topical Q0600  . Chlorhexidine Gluconate Cloth  6 each Topical Q0600  . cinacalcet  60 mg Oral BID WC  . digoxin  62.5 mcg Oral Daily  . diltiazem  120 mg Oral Daily  . dorzolamide-timolol  1 drop Both Eyes BID  . doxercalciferol  3 mcg Intravenous Q M,W,F-HD  . feeding supplement (NEPRO CARB STEADY)  237 mL Oral BID BM  . [START ON 11/03/2017] heparin  2,800 Units Dialysis Once in dialysis  . multivitamin  1 tablet Oral QHS  . pantoprazole  40 mg Oral Daily  . polyethylene glycol  17 g Oral Daily  . pravastatin  10 mg Oral Daily  . predniSONE  5 mg Oral QHS  . sertraline  50 mg Oral Daily  . sevelamer carbonate  1,600 mg Oral TID WC  . Warfarin - Pharmacist Dosing Inpatient   Does not apply q1800    Pertinent Labs/Diagnostics:  IMPRESSION: 1. Stable encephalomalacia involving the LEFT PCA territory. 2.  No evidence for acute intracranial abnormality  EEG results pending    Etta Quill PA-C [facility] [phone]   Assessment: Given history most likely TGA  Recommendations: -Awaiting EEG findings    11/02/2017, 9:49 AM

## 2017-11-02 NOTE — Progress Notes (Signed)
Pt & family given discharge instructions, prescriptions, and care notes. Pt verbalized understanding AEB no further questions or concerns at this time. Pt left the floor via wheelchair with staff in stable condition.

## 2017-11-02 NOTE — Discharge Summary (Signed)
Physician Discharge Summary  Alexander [name] GEX:[id number] DOB: 06/16/1940 DOA: 10/28/2017  PCP: Deland Pretty, MD  Admit date: 10/28/2017 Discharge date: 11/02/2017  Admitted From: Home Disposition:  Home  Discharge Condition:Stable CODE STATUS:FULL Diet recommendation:Renal   Brief/Interim Summary: Alexander [name] a 77 y.o.malewith medical history significant ofESRD on dialysison M/W/F, history of a stroke, status post TAVR, permanent atrial fibrillation on pacemaker, on Coumadin,recurrent urinary tract infection who was sent by ID from the office to the emergency department for admission.As per the ID, patient urine culture grew Enterococcus faecalis which was resistant to most of the antibiotics. Patient has issues with urinary tract infections recently and he was following with infectious disease. He was being treated with vancomycin after dialysis as per ID. Since last 4 weeks, patient was having dysuria, burning sensation and hematuria. Though he is on dialysis, he produces little bit of urine and he has been having increased frequency of urination.  Patient was admitted for IV antibiotics for resistant UTI.  He was eventually treated with fosfomycin. His hospital course was remarkable for transient episode of  altered mental status.  Neurology was consulted.  He underwent EEG.  EEG did not show any seizure activity. Thought to be secondary to transient global amnesia. He is hemodynamically stable.  Plan is to discharge to home today.  He needs to follow-up with GI and urology as an outpatient.  Following problems were addressed during his hospitalization:  Urinary tract infection:Urine culture done as an outpatient showed Enterococcus faecalis which was drug-resistant.  He was started on vancomycin and ceftazidime.  Repeat culture here showed E. coli which was pansensitive.  Patient was treated with 1 dose of fosfomycin. CT abdomen and pelvis showed possibleslight rectal  prolapse with prominence of the soft tissues at the anus. This could be due to hemorrhoids vs a mass. Small enhancing lesion in the right posterolateral aspect of the bladder as well as slight enhancement of the mucosa of the dome of the bladder. Possibility of bladder neoplasm should be considered. He will follow-up with urology(Dr. Jeffie Pollock) and gastroenterology as an outpatient.  He still complains of mild dysuria.  H/o Streptococcal bovis bacteremia: TTE ordered by ID for evaluation of endocarditis. It does not show any vegetations.  The bovis bacteremia was one year ago  Confusion/ Acute Encephalopathy :Very transient.Pt has a h/o stroke more than 10 year ago.EEG ordered and did not show any seizure activity. Neurology was following.Thought to be due to transient global amnesia.CT head did not show any acute intracranial abnormalities and showed stable encephalomalacia involving the LEFT PCA territory.  ESRD on dialysis: Dialyzed on Monday, Wednesday and Friday. He got dialyzed today..  Hematuria: Follow-up with urology as an outpatient.Apparently resolved  Permanent A. WNU:[id number] rate is controlled. On digoxin and Cardizem at home which we will continue. He is on warfarin for anticoagulation. We will continue to monitor INR. We will continue warfarin.He is also status post pacemaker.He has history of TAVR.Follows with cardiology.  Hyperlipidemia: Continue statin  Anemia:Mild.Secondary to ESRD.Currently H&H is stable.  Deconditioning/debility: Patient increasingly weak and declining since last few weeks.Walks with the help of walker.   Physical therapy evaluated him here and did not recommend anything specific.  Lactic acidosis:Resolved  Patient is on chronic prednisone therapy for arthritis. He is also on bromocriptine for bilateral breast enlargement.       Discharge Diagnoses:  Principal Problem:   Failure to thrive syndrome, adult Active  Problems:   Anemia, unspecified   ESRD (end  stage renal disease) on dialysis ([place])   Hyperlipidemia   Persistent atrial fibrillation ([place])   Long term (current) use of anticoagulants [Z79.01]   UTI (urinary tract infection)   Hematuria   Balanitis    Discharge Instructions  Discharge Instructions    Diet - low sodium heart healthy   Complete by:  As directed    Diet - low sodium heart healthy   Complete by:  As directed    Discharge instructions   Complete by:  As directed    Follow up with PCP in one week.  Please follow up with Dr Jeffie Pollock next week as scheduled.   Discharge instructions   Complete by:  As directed    1) Follow up with your PCP in a week. 2) Take prescribed medications as instructed. 3) Follow up with Urology and gastroenterology as an outpatient. Name and number of the provider has been attached.   Increase activity slowly   Complete by:  As directed      Allergies as of 11/02/2017      Reactions   Penicillins Swelling, Rash   Has patient had a PCN reaction causing immediate rash, facial/tongue/throat swelling, SOB or lightheadedness with hypotension: Yes Has patient had a PCN reaction causing severe rash involving mucus membranes or skin necrosis: No Has patient had a PCN reaction that required hospitalization: No Has patient had a PCN reaction occurring within the last 10 years: No If all of the above answers are "NO", then may proceed with Cephalosporin use.   Atorvastatin    MYALGIA   Codeine Nausea Only   Tramadol Nausea Only      Medication List    STOP taking these medications   clindamycin 300 MG capsule Commonly known as:  CLEOCIN   midodrine 5 MG tablet Commonly known as:  PROAMATINE     TAKE these medications   acetaminophen 325 MG tablet Commonly known as:  TYLENOL Take 975 mg by mouth daily as needed for headache.   b complex-vitamin c-folic acid 0.8 MG Tabs tablet Take 1 tablet by mouth daily.   bromocriptine 5 MG  capsule Commonly known as:  PARLODEL Take 5 mg by mouth See admin instructions. Monday, Wednesday and Friday only   cetirizine 10 MG tablet Commonly known as:  ZYRTEC Take 10 mg by mouth at bedtime.   cinacalcet 30 MG tablet Commonly known as:  SENSIPAR Take 2 tablets (60 mg total) by mouth 2 (two) times daily with a meal. What changed:  how much to take   Sandoval 0.125 MG tablet Generic drug:  digoxin TAKE 1/2 TABLET BY MOUTH EVERY DAY   diltiazem 120 MG 24 hr capsule Commonly known as:  CARDIZEM CD Take 1 capsule (120 mg total) by mouth daily.   dorzolamide-timolol 22.3-6.8 MG/ML ophthalmic solution Commonly known as:  COSOPT Place 1 drop into both eyes 2 (two) times daily.   NEPRO Liqd Take 237 mLs by mouth every Monday, Wednesday, and Friday with hemodialysis.   omeprazole 20 MG capsule Commonly known as:  PRILOSEC Take 20 mg by mouth daily.   polyethylene glycol packet Commonly known as:  MIRALAX / GLYCOLAX Take 17 g by mouth daily.   pravastatin 10 MG tablet Commonly known as:  PRAVACHOL Take 1 tablet (10 mg total) by mouth daily.   predniSONE 5 MG tablet Commonly known as:  DELTASONE Take 5 mg by mouth at bedtime.   sertraline 50 MG tablet Commonly known as:  ZOLOFT Take 50 mg by  mouth daily.   sevelamer carbonate 800 MG tablet Commonly known as:  RENVELA Take 2 tablets (1,600 mg total) by mouth 3 (three) times daily with meals. What changed:  how much to take   Tiotropium Bromide-Olodaterol 2.5-2.5 MCG/ACT Aers Commonly known as:  STIOLTO RESPIMAT Inhale 2 puffs into the lungs daily.   warfarin 5 MG tablet Commonly known as:  COUMADIN Take as directed. If you are unsure how to take this medication, talk to your nurse or doctor. Original instructions:  Take 1-1.5 tablets by mouth daily as directed by coumadin clinic What changed:    how much to take  how to take this  when to take this  additional instructions      Follow-up Information     Nickie Retort, MD. Schedule an appointment as soon as possible for a visit in 2 week(s).   Specialty:  Urology Contact information: [place] [address] [phone]        Deland Pretty, MD. Schedule an appointment as soon as possible for a visit in 1 week(s).   Specialty:  Internal Medicine Contact information: [address] [place] [address] [phone]        Jackquline Denmark, MD. Schedule an appointment as soon as possible for a visit in 2 week(s).   Specialties:  Gastroenterology, Internal Medicine Contact information: [place] [address] [phone]          Allergies  Allergen Reactions  . Penicillins Swelling and Rash    Has patient had a PCN reaction causing immediate rash, facial/tongue/throat swelling, SOB or lightheadedness with hypotension: Yes Has patient had a PCN reaction causing severe rash involving mucus membranes or skin necrosis: No Has patient had a PCN reaction that required hospitalization: No Has patient had a PCN reaction occurring within the last 10 years: No If all of the above answers are "NO", then may proceed with Cephalosporin use.   . Atorvastatin     MYALGIA   . Codeine Nausea Only  . Tramadol Nausea Only    Consultations: ID, neurology  Procedures/Studies: Ct Head Wo Contrast  Result Date: 11/01/2017 CLINICAL DATA:  Altered level of consciousness. EXAM: CT HEAD WITHOUT CONTRAST TECHNIQUE: Contiguous axial images were obtained from the base of the skull through the vertex without intravenous contrast. COMPARISON:  CT 05/12/2017 FINDINGS: Brain: Large area of encephalomalacia involving the temporal, parietal, and occipital lobe. There is mild central and cortical atrophy. Periventricular white matter changes are consistent with small vessel disease. There is no intra or extra-axial fluid collection or mass lesion. The basilar cisterns and ventricles  have a normal appearance. There is no CT evidence for acute infarction or hemorrhage. Vascular: There is atherosclerotic calcifications of the carotic siphons. Skull: Normal. Negative for fracture or focal lesion. Sinuses/Orbits: Minimal mucosal thickening of the paranasal sinuses. Other: There is cerumen within the bilateralexternal auditory canals. IMPRESSION: 1. Stable encephalomalacia involving the LEFT PCA territory. 2.  No evidence for acute intracranial abnormality. Electronically Signed   By: Nolon Nations M.D.   On: 11/01/2017 16:10   Ct Abdomen Pelvis W Contrast  Result Date: 10/31/2017 CLINICAL DATA:  Rectal bleeding for 2 weeks. Unintended weight loss. Abdominal pain. EXAM: CT ABDOMEN AND PELVIS WITH CONTRAST TECHNIQUE: Multidetector CT imaging of the abdomen and pelvis was performed using the standard protocol following bolus administration of intravenous contrast. CONTRAST:  142mL OMNIPAQUE IOHEXOL 300 MG/ML  SOLN COMPARISON:  CT scan of  the abdomen dated 08/24/2016 FINDINGS: Lower chest: Small left pleural effusion, minimally more prominent than on the study in 2018. Tiny right effusion. Aortic atherosclerosis. Hepatobiliary: No focal liver abnormality is seen. No gallstones, gallbladder wall thickening, or biliary dilatation. Pancreas: Unremarkable. No pancreatic ductal dilatation or surrounding inflammatory changes. Tiny benign calcifications in the pancreatic head and in the pancreatic tail, unchanged. Spleen: Normal in size without focal abnormality. Adrenals/Urinary Tract: 6 mm area of nodular enhancement at the inferior right posterolateral aspect of the bladder seen on image 80 of series 3. Subtle enhancement of the right side of the dome of the bladder best seen on image 70 of series 6 and image 93 of series 7. Severe bilateral renal atrophy with multiple bilateral renal cysts, unchanged. No hydronephrosis. Normal adrenal glands. Stomach/Bowel: Sigmoid diverticulosis. Suggestion of  slight rectal prolapse with prominence of the soft tissues at the anus. Otherwise negative. Vascular/Lymphatic: Extensive aortic atherosclerosis. No adenopathy. Reproductive: Focal cyst in the median lobe of the prostate gland, unchanged. Otherwise negative. Other: No abdominal wall hernia or abnormality. No abdominopelvic ascites. Musculoskeletal: Chronic severe degenerative disc disease throughout the lumbar spine. No acute abnormalities. IMPRESSION: 1. Suggestion of slight rectal prolapse with prominence of the soft tissues at the anus. This could be due to hemorrhoids but I can't exclude a mass. 2. Small enhancing lesion in the right posterolateral aspect of the bladder as well as slight enhancement of the mucosa of the dome of the bladder. Possibility of bladder neoplasm should be considered. 3. Extensive diverticulosis of the sigmoid portion of the colon without discrete diverticulitis. 4.  Aortic Atherosclerosis (ICD10-I70.0). 5. Small nonspecific left pleural effusion, minimally increased since 2018. Electronically Signed   By: Lorriane Shire M.D.   On: 10/31/2017 09:57   Dg Chest Port 1 View  Result Date: 10/28/2017 CLINICAL DATA:  Two-month history of generalized weakness and hematuria. EXAM: PORTABLE CHEST 1 VIEW COMPARISON:  05/12/2017, 04/28/2017 and earlier, including CT chest 01/04/2018 and earlier. FINDINGS: Cardiac silhouette moderately enlarged, unchanged. Prior TAVR. RIGHT subclavian dual lead transvenous pacemaker unchanged and appears intact. Thoracic aorta tortuous and atherosclerotic, unchanged. Hilar and mediastinal contours otherwise unremarkable. Mild pulmonary venous hypertension without overt edema, unchanged. Mild atelectasis involving the lung bases, LEFT greater than RIGHT. Lungs otherwise clear. No confluent airspace consolidation. No visible pleural effusions. IMPRESSION: 1. Mild bibasilar atelectasis, LEFT greater than RIGHT. No acute cardiopulmonary disease otherwise. 2. Stable  moderate cardiomegaly without evidence of pulmonary edema. Electronically Signed   By: Evangeline Dakin M.D.   On: 10/28/2017 15:03       Subjective: Patient seen and examined the bedside this morning.  Remains comfortable.  No new issues/events.  Hemodynamically stable. Discharge planning discussed with Dr. [name].  Discharge Exam: Vitals:   11/02/17 1030 11/02/17 1051  BP: (!) 167/85 (!) 160/72  Pulse: 63 64  Resp:  18  Temp:  97.6 F (36.4 C)  SpO2:  97%   Vitals:   11/02/17 0930 11/02/17 1000 11/02/17 1030 11/02/17 1051  BP: (!) 158/69 (!) 147/72 (!) 167/85 (!) 160/72  Pulse: 63 63 63 64  Resp:    18  Temp:    97.6 F (36.4 C)  TempSrc:    Oral  SpO2:    97%  Weight:    86.3 kg (190 lb 4.1 oz)  Height:        General: Pt is alert, awake, not in acute distress Cardiovascular: RRR, S1/S2 +, no rubs, no gallops Respiratory: CTA bilaterally, no wheezing,  no rhonchi Abdominal: Soft, NT, ND, bowel sounds + Extremities: no edema, no cyanosis    The results of significant diagnostics from this hospitalization (including imaging, microbiology, ancillary and laboratory) are listed below for reference.     Microbiology: Recent Results (from the past 240 hour(s))  Culture, blood (routine x 2)     Status: None   Collection Time: 10/28/17  2:03 PM  Result Value Ref Range Status   Specimen Description   Final    BLOOD RIGHT HAND Performed at [facility] [address] [place], [place] [address]    Special Requests   Final    BOTTLES DRAWN AEROBIC AND ANAEROBIC Blood Culture results may not be optimal due to an inadequate volume of blood received in culture bottles Performed at [facility] [address], [place], [place] [address]    Culture   Final    NO GROWTH 5 DAYS Performed at [facility], [place] [address], [place], [place] [address]    Report Status 11/02/2017 FINAL  Final  Culture, blood (routine x 2)     Status:  None   Collection Time: 10/28/17  2:08 PM  Result Value Ref Range Status   Specimen Description   Final    BLOOD RIGHT ANTECUBITAL Performed at [facility] [address], [place], [place] [address]    Special Requests   Final    BOTTLES DRAWN AEROBIC AND ANAEROBIC Blood Culture adequate volume Performed at [facility] [address] [place], [place] [address]    Culture   Final    NO GROWTH 5 DAYS Performed at [facility], [place] [address], [place], [place] [address]    Report Status 11/02/2017 FINAL  Final  Urine culture     Status: Abnormal   Collection Time: 10/28/17  2:42 PM  Result Value Ref Range Status   Specimen Description   Final    URINE, RANDOM Performed at [facility] [address], [place],  [address]    Special Requests   Final    NONE Performed at [facility], [place] [address] [place], [place] [address]    Culture 80,000 COLONIES/mL ESCHERICHIA COLI (A)  Final   Report Status 10/30/2017 FINAL  Final   Organism ID, Bacteria ESCHERICHIA COLI (A)  Final      Susceptibility   Escherichia coli - MIC*    AMPICILLIN 16 INTERMEDIATE Intermediate     CEFAZOLIN <=4 SENSITIVE Sensitive     CEFTRIAXONE <=1 SENSITIVE Sensitive     CIPROFLOXACIN <=0.25 SENSITIVE Sensitive     GENTAMICIN <=1 SENSITIVE Sensitive     IMIPENEM <=0.25 SENSITIVE Sensitive     NITROFURANTOIN <=16 SENSITIVE Sensitive     TRIMETH/SULFA <=20 SENSITIVE Sensitive     AMPICILLIN/SULBACTAM 4 SENSITIVE Sensitive     PIP/TAZO <=4 SENSITIVE Sensitive     Extended ESBL NEGATIVE Sensitive     * 80,000 COLONIES/mL ESCHERICHIA COLI  MRSA PCR Screening     Status: None   Collection Time: 10/29/17  6:05 AM  Result Value Ref Range Status   MRSA by PCR NEGATIVE NEGATIVE Final    Comment:        The GeneXpert MRSA Assay (FDA approved for NASAL specimens only), is one component of a comprehensive MRSA  colonization surveillance program. It is not intended to diagnose MRSA infection nor to guide or monitor treatment for MRSA infections. Performed at [facility], [place] [address] [place], [place] [address]      Labs: BNP (last 3 results) No results for input(s): BNP in the last 8760 hours. Basic Metabolic Panel: Recent Labs  Lab 10/28/17 1428 10/29/17 0824 10/31/17 1119 11/01/17 1605 11/02/17 0434  NA 141 141 137 140 139  K 3.4* 4.0 4.4 3.8 4.1  CL 96* 97* 94* 98 96*  CO2 32 31 27 29 28   GLUCOSE 143* 139* 181* 107* 150*  BUN 16 24* 55* 27* 33*  CREATININE 3.53* 5.61* 10.12* 6.73* 7.61*  CALCIUM 8.7* 8.9 9.2 8.6* 8.5*  PHOS  --   --   --   --  4.6   Liver Function Tests: Recent Labs  Lab 10/28/17 1428 11/02/17 0434  AST 23  --   ALT 24  --   ALKPHOS 68  --   BILITOT 0.5  --   PROT 7.0  --   ALBUMIN 3.5 3.1*   No results for input(s): LIPASE, AMYLASE in the last 168 hours. Recent Labs  Lab 10/28/17 1428  AMMONIA 41*   CBC: Recent Labs  Lab 10/28/17 1428 10/29/17 0824 10/31/17 1119 11/01/17 1605 11/02/17 0434  WBC 9.5 8.2 11.7* 10.5 10.2  NEUTROABS 7.4  --   --   --   --   HGB 11.9* 11.2* 12.1* 11.5* 11.1*  HCT 37.6* 36.3* 39.1 36.4* 36.9*  MCV 102.5* 102.5* 102.9* 104.9* 104.8*  PLT 200 172 213 207 191   Cardiac Enzymes: No results for input(s): CKTOTAL, CKMB, CKMBINDEX, TROPONINI in the last 168 hours. BNP: Invalid input(s): POCBNP CBG: Recent Labs  Lab 11/01/17 1322  GLUCAP 141*   D-Dimer No results for input(s): DDIMER in the last 72 hours. Hgb A1c No results for input(s): HGBA1C in the last 72 hours. Lipid Profile No results for input(s): CHOL, HDL, LDLCALC, TRIG, CHOLHDL, LDLDIRECT in the last 72 hours. Thyroid function studies No results for input(s): TSH, T4TOTAL, T3FREE, THYROIDAB in the last 72 hours.  Invalid input(s): FREET3 Anemia work up No results for input(s): VITAMINB12, FOLATE, FERRITIN, TIBC, IRON, RETICCTPCT  in the last 72 hours. Urinalysis    Component Value Date/Time   COLORURINE YELLOW 10/29/2017 1546   APPEARANCEUR HAZY (A) 10/29/2017 1546   LABSPEC 1.010 10/29/2017 1546   PHURINE 8.0 10/29/2017 1546   GLUCOSEU 50 (A) 10/29/2017 1546   HGBUR LARGE (A) 10/29/2017 1546   BILIRUBINUR NEGATIVE 10/29/2017 1546   KETONESUR NEGATIVE 10/29/2017 1546   PROTEINUR 100 (A) 10/29/2017 1546   UROBILINOGEN 0.2 11/12/2014 1318   NITRITE NEGATIVE 10/29/2017 1546   LEUKOCYTESUR LARGE (A) 10/29/2017 1546   Sepsis Labs Invalid input(s): PROCALCITONIN,  WBC,  LACTICIDVEN Microbiology Recent Results (from the past 240 hour(s))  Culture, blood (routine x 2)     Status: None   Collection Time: 10/28/17  2:03 PM  Result Value Ref Range Status   Specimen Description   Final    BLOOD RIGHT HAND Performed at [facility] [address], [place], [place] [address]    Special Requests   Final    BOTTLES DRAWN AEROBIC AND ANAEROBIC Blood Culture results may not be optimal due to an inadequate volume of blood received in culture bottles Performed at [facility] [address], [place], [place] [address]    Culture   Final    NO GROWTH 5 DAYS Performed at [facility], [place] [address] [place], [place] [address]    Report Status 11/02/2017  FINAL  Final  Culture, blood (routine x 2)     Status: None   Collection Time: 10/28/17  2:08 PM  Result Value Ref Range Status   Specimen Description   Final    BLOOD RIGHT ANTECUBITAL Performed at [facility] [address], [place], [place] [address]    Special Requests   Final    BOTTLES DRAWN AEROBIC AND ANAEROBIC Blood Culture adequate volume Performed at [facility] [address] [place], [place] [address]    Culture   Final    NO GROWTH 5 DAYS Performed at [facility], [place] [address], [place], [place] [address]    Report Status 11/02/2017 FINAL  Final  Urine  culture     Status: Abnormal   Collection Time: 10/28/17  2:42 PM  Result Value Ref Range Status   Specimen Description   Final    URINE, RANDOM Performed at [facility] [address], [place], [place] [address]    Special Requests   Final    NONE Performed at [facility], [place] [address] [place], [place] [address]    Culture 80,000 COLONIES/mL ESCHERICHIA COLI (A)  Final   Report Status 10/30/2017 FINAL  Final   Organism ID, Bacteria ESCHERICHIA COLI (A)  Final      Susceptibility   Escherichia coli - MIC*    AMPICILLIN 16 INTERMEDIATE Intermediate     CEFAZOLIN <=4 SENSITIVE Sensitive     CEFTRIAXONE <=1 SENSITIVE Sensitive     CIPROFLOXACIN <=0.25 SENSITIVE Sensitive     GENTAMICIN <=1 SENSITIVE Sensitive     IMIPENEM <=0.25 SENSITIVE Sensitive     NITROFURANTOIN <=16 SENSITIVE Sensitive     TRIMETH/SULFA <=20 SENSITIVE Sensitive     AMPICILLIN/SULBACTAM 4 SENSITIVE Sensitive     PIP/TAZO <=4 SENSITIVE Sensitive     Extended ESBL NEGATIVE Sensitive     * 80,000 COLONIES/mL ESCHERICHIA COLI  MRSA PCR Screening     Status: None   Collection Time: 10/29/17  6:05 AM  Result Value Ref Range Status   MRSA by PCR NEGATIVE NEGATIVE Final    Comment:        The GeneXpert MRSA Assay (FDA approved for NASAL specimens only), is one component of a comprehensive MRSA colonization surveillance program. It is not intended to diagnose MRSA infection nor to guide or monitor treatment for MRSA infections. Performed at [facility], [place] [address], [place], [place] [address]     Please note: You were cared for by a hospitalist during your hospital stay. Once you are discharged, your primary care physician will handle any further medical issues. Please note that NO REFILLS for any discharge medications will be authorized once you are discharged, as it is imperative that you return to your primary care physician (or establish a relationship  with a primary care physician if you do not have one) for your post hospital discharge needs so that they can reassess your need for medications and monitor your lab values.    Time coordinating discharge: 40 minutes  SIGNED:   Shelly Coss, MD  [facility] 11/02/2017, 3:52 PM Pager [phone]  If 7PM-7AM, please contact night-coverage [email or web address] Password [email or web address]

## 2017-11-02 NOTE — Procedures (Signed)
History: 77 yo M with transient memory difficulty  Sedation: None  Technique: This is a 21 channel routine scalp EEG performed at the bedside with bipolar and monopolar montages arranged in accordance to the international 10/20 system of electrode placement. One channel was dedicated to EKG recording.    Background: The background consists of intermixed alpha and beta activities. There is a well defined posterior dominant rhythm of 8 Hz that attenuates with eye opening.There is an increase in delta associated with drowsiness. Sleep is not recorded.   Photic stimulation: Physiologic driving is not performed   EEG Abnormalities: None  Clinical Interpretation: This normal EEG is recorded in the waking and drowsy state. There was no seizure or seizure predisposition recorded on this study. Please note that a normal EEG does not preclude the possibility of epilepsy.   Roland Rack, MD [facility] [phone]  If 7pm- 7am, please page neurology on call as listed in [place].

## 2017-11-02 NOTE — Progress Notes (Signed)
PROGRESS NOTE    Alexander [name]  XTG:[id number] DOB: [birth year] DOA: 10/28/2017 PCP: Deland Pretty, MD   Brief Narrative:  Alexander [name] is a 77 y.o. male with medical history significant of ESRD on dialysis on M/W/F, history of a stroke, status post TAVR, permanent atrial fibrillation on pacemaker, on Coumadin, recurrent urinary tract infection who was sent by ID from the office to the emergency department for admission.  As per the ID, patient urine culture grew Enterococcus faecalis which was resistant to most of the antibiotics.  Patient has issues with urinary tract infections recently and he was following with infectious disease.  He was being treated with vancomycin after dialysis as per ID.  Since last 4 weeks, patient was having dysuria, burning sensation and hematuria.  Though he is on dialysis, he produces little bit of urine and he has been having increased frequency of urination.  Patient was admitted for IV antibiotics for resistant UTI.  He was eventually treated with fosfomycin. He his hospital course was remarkable for transient development of altered mental status.  Neurology was consulted.  He underwent  EEG, awaiting EEG report.  Plan is to discharge home after neurology clearance.    Assessment & Plan:   Principal Problem:   Failure to thrive syndrome, adult Active Problems:   Anemia, unspecified   ESRD (end stage renal disease) on dialysis (HCC)   Hyperlipidemia   Persistent atrial fibrillation (HCC)   Long term (current) use of anticoagulants [Z79.01]   UTI (urinary tract infection)   Hematuria   Balanitis   Urinary tract infection:Urine culture done as an outpatient  showed Enterococcus faecalis which was drug-resistant.    He was started on vancomycin and ceftazidime.  Repeat culture here showed E. coli which was pansensitive.  Patient was treated with 1 dose of fosfomycin. CT abdomen and pelvis showed possible slight rectal prolapse with prominence of the soft  tissues at the anus. This could be due to hemorrhoids vs a mass. Small enhancing lesion in the right posterolateral aspect of the bladder as well as slight enhancement of the mucosa of the dome of the bladder. Possibility of bladder neoplasm should be considered. He will follow-up with urology(Dr. Jeffie Pollock) and gastroenterology as an outpatient.  He still complains of mild dysuria.  H/o Streptococcal bovis bacteremia: TTE ordered by ID for evaluation of endocarditis. It does not show any vegetations.  The bovis bacteremia was one year ago  Confusion / Acute Encephalopathy :Very transient .Pt has a h/o stroke more than 10 year ago.EEG ordered. Neurology consulted.R/O Seizure.  Likely transient global amnesia.CT head did not show any acute intracranial abnormalities and showed stable encephalomalacia involving the LEFT PCA territory.  ESRD on dialysis: Dialyzed on Monday, Wednesday and Friday. He got dialyzed today..  Hematuria: Follow-up with urology as an outpatient.Apparently resolved  Permanent A. fib: Currently rate is controlled.  On digoxin and Cardizem at home which we will continue.  He is on warfarin for anticoagulation.  We will continue to monitor INR.  We will continue warfarin.  He is also status post pacemaker.  He has history of TAVR.  Follows with cardiology.  Hyperlipidemia: Continue statin  Anemia: Mild.Secondary to ESRD.  Currently H&H is stable.   Deconditioning/debility: Patient increasingly weak and declining since last few weeks.  Walks with the help of walker.    Physical therapy evaluated him here and did not recommend anything specific.  Lactic acidosis: Resolved  Patient is on chronic prednisone therapy for arthritis.  He is also on bromocriptine for bilateral breast enlargement.   DVT prophylaxis:Coumadin Code Status: Full Family Communication: Discussed with daughter at the bedside Disposition Plan: Home after EEG report, neurology  clearance   Consultants: Neurology, ID  Procedures: EEG  Antimicrobials: None  Subjective: Patient seen and examined at bedside this afternoon.  Remains comfortable .Hemodynamically  stable.  Complains of mild dysuria otherwise no complaints.  Objective: Vitals:   11/02/17 0930 11/02/17 1000 11/02/17 1030 11/02/17 1051  BP: (!) 158/69 (!) 147/72 (!) 167/85 (!) 160/72  Pulse: 63 63 63 64  Resp:    18  Temp:    97.6 F (36.4 C)  TempSrc:    Oral  SpO2:    97%  Weight:    86.3 kg (190 lb 4.1 oz)  Height:        Intake/Output Summary (Last 24 hours) at 11/02/2017 1400 Last data filed at 11/02/2017 1051 Gross per 24 hour  Intake 240 ml  Output 2000 ml  Net -1760 ml   Filed Weights   10/31/17 1743 11/02/17 0637 11/02/17 1051  Weight: 88.3 kg (194 lb 10.7 oz) 88.8 kg (195 lb 12.3 oz) 86.3 kg (190 lb 4.1 oz)    Examination:  General exam: Appears calm and comfortable ,Not in distress,average built HEENT:PERRL,Oral mucosa moist, Ear/Nose normal on gross exam Respiratory system: Bilateral equal air entry, normal vesicular breath sounds, no wheezes or crackles  Cardiovascular system: S1 & S2 heard, RRR. No JVD, murmurs, rubs, gallops or clicks. No pedal edema. Gastrointestinal system: Abdomen is nondistended, soft and nontender. No organomegaly or masses felt. Normal bowel sounds heard. Central nervous system: Alert and oriented. No focal neurological deficits. Extremities: No edema, no clubbing ,no cyanosis, distal peripheral pulses palpable. Skin: Ecchymoses on upper extremities, lesions, ulcers. No induration AV fistula on the right arm MSK: Normal muscle bulk,tone ,power Psychiatry: Judgement and insight appear normal. Mood & affect appropriate.     Data Reviewed: I have personally reviewed following labs and imaging studies  CBC: Recent Labs  Lab 10/28/17 1428 10/29/17 0824 10/31/17 1119 11/01/17 1605 11/02/17 0434  WBC 9.5 8.2 11.7* 10.5 10.2  NEUTROABS 7.4  --    --   --   --   HGB 11.9* 11.2* 12.1* 11.5* 11.1*  HCT 37.6* 36.3* 39.1 36.4* 36.9*  MCV 102.5* 102.5* 102.9* 104.9* 104.8*  PLT 200 172 213 207 932   Basic Metabolic Panel: Recent Labs  Lab 10/28/17 1428 10/29/17 0824 10/31/17 1119 11/01/17 1605 11/02/17 0434  NA 141 141 137 140 139  K 3.4* 4.0 4.4 3.8 4.1  CL 96* 97* 94* 98 96*  CO2 32 31 27 29 28   GLUCOSE 143* 139* 181* 107* 150*  BUN 16 24* 55* 27* 33*  CREATININE 3.53* 5.61* 10.12* 6.73* 7.61*  CALCIUM 8.7* 8.9 9.2 8.6* 8.5*  PHOS  --   --   --   --  4.6   GFR: Estimated Creatinine Clearance: 8.4 mL/min (A) (by C-G formula based on SCr of 7.61 mg/dL (H)). Liver Function Tests: Recent Labs  Lab 10/28/17 1428 11/02/17 0434  AST 23  --   ALT 24  --   ALKPHOS 68  --   BILITOT 0.5  --   PROT 7.0  --   ALBUMIN 3.5 3.1*   No results for input(s): LIPASE, AMYLASE in the last 168 hours. Recent Labs  Lab 10/28/17 1428  AMMONIA 41*   Coagulation Profile: Recent Labs  Lab 10/29/17 0824 10/30/17 0416 10/31/17  1700 11/01/17 0501 11/02/17 0434  INR 2.00 2.30 2.54 2.39 2.02   Cardiac Enzymes: No results for input(s): CKTOTAL, CKMB, CKMBINDEX, TROPONINI in the last 168 hours. BNP (last 3 results) Recent Labs    11/11/16 1143  PROBNP 18,028*   HbA1C: No results for input(s): HGBA1C in the last 72 hours. CBG: Recent Labs  Lab 11/01/17 1322  GLUCAP 141*   Lipid Profile: No results for input(s): CHOL, HDL, LDLCALC, TRIG, CHOLHDL, LDLDIRECT in the last 72 hours. Thyroid Function Tests: No results for input(s): TSH, T4TOTAL, FREET4, T3FREE, THYROIDAB in the last 72 hours. Anemia Panel: No results for input(s): VITAMINB12, FOLATE, FERRITIN, TIBC, IRON, RETICCTPCT in the last 72 hours. Sepsis Labs: Recent Labs  Lab 10/28/17 1435 10/28/17 1710 10/29/17 0824  LATICACIDVEN 2.02* 1.19 1.6    Recent Results (from the past 240 hour(s))  Culture, blood (routine x 2)     Status: None   Collection Time:  10/28/17  2:03 PM  Result Value Ref Range Status   Specimen Description   Final    BLOOD RIGHT HAND Performed at [facility] [address], [place], [place] [address]    Special Requests   Final    BOTTLES DRAWN AEROBIC AND ANAEROBIC Blood Culture results may not be optimal due to an inadequate volume of blood received in culture bottles Performed at [facility] [address] [place], [place] [address]    Culture   Final    NO GROWTH 5 DAYS Performed at [facility], [place] [address], [place], [place] [address]    Report Status 11/02/2017 FINAL  Final  Culture, blood (routine x 2)     Status: None   Collection Time: 10/28/17  2:08 PM  Result Value Ref Range Status   Specimen Description   Final    BLOOD RIGHT ANTECUBITAL Performed at [facility] [address] [place], [place] [address]    Special Requests   Final    BOTTLES DRAWN AEROBIC AND ANAEROBIC Blood Culture adequate volume Performed at [facility] [address], [place], [place] [address]    Culture   Final    NO GROWTH 5 DAYS Performed at [facility], [place] [address], [place], [place] [address]    Report Status 11/02/2017 FINAL  Final  Urine culture     Status: Abnormal   Collection Time: 10/28/17  2:42 PM  Result Value Ref Range Status   Specimen Description   Final    URINE, RANDOM Performed at [facility] [address] [place],  [address]    Special Requests   Final    NONE Performed at [facility], [place] [address], [place], [place] [address]    Culture 80,000 COLONIES/mL ESCHERICHIA COLI (A)  Final   Report Status 10/30/2017 FINAL  Final   Organism ID, Bacteria ESCHERICHIA COLI (A)  Final      Susceptibility   Escherichia coli - MIC*    AMPICILLIN 16 INTERMEDIATE Intermediate     CEFAZOLIN <=4 SENSITIVE Sensitive     CEFTRIAXONE <=1 SENSITIVE Sensitive      CIPROFLOXACIN <=0.25 SENSITIVE Sensitive     GENTAMICIN <=1 SENSITIVE Sensitive     IMIPENEM <=0.25 SENSITIVE Sensitive     NITROFURANTOIN <=16 SENSITIVE Sensitive     TRIMETH/SULFA <=20 SENSITIVE Sensitive     AMPICILLIN/SULBACTAM 4 SENSITIVE Sensitive     PIP/TAZO <=4 SENSITIVE Sensitive  Extended ESBL NEGATIVE Sensitive     * 80,000 COLONIES/mL ESCHERICHIA COLI  MRSA PCR Screening     Status: None   Collection Time: 10/29/17  6:05 AM  Result Value Ref Range Status   MRSA by PCR NEGATIVE NEGATIVE Final    Comment:        The GeneXpert MRSA Assay (FDA approved for NASAL specimens only), is one component of a comprehensive MRSA colonization surveillance program. It is not intended to diagnose MRSA infection nor to guide or monitor treatment for MRSA infections. Performed at [facility], [place] [address], [place], [place] [address]          Radiology Studies: Ct Head Wo Contrast  Result Date: 11/01/2017 CLINICAL DATA:  Altered level of consciousness. EXAM: CT HEAD WITHOUT CONTRAST TECHNIQUE: Contiguous axial images were obtained from the base of the skull through the vertex without intravenous contrast. COMPARISON:  CT 05/12/2017 FINDINGS: Brain: Large area of encephalomalacia involving the temporal, parietal, and occipital lobe. There is mild central and cortical atrophy. Periventricular white matter changes are consistent with small vessel disease. There is no intra or extra-axial fluid collection or mass lesion. The basilar cisterns and ventricles have a normal appearance. There is no CT evidence for acute infarction or hemorrhage. Vascular: There is atherosclerotic calcifications of the carotic siphons. Skull: Normal. Negative for fracture or focal lesion. Sinuses/Orbits: Minimal mucosal thickening of the paranasal sinuses. Other: There is cerumen within the bilateralexternal auditory canals. IMPRESSION: 1. Stable encephalomalacia involving the LEFT PCA territory. 2.  No  evidence for acute intracranial abnormality. Electronically Signed   By: Nolon Nations M.D.   On: 11/01/2017 16:10        Scheduled Meds: . bromocriptine  5 mg Oral Q M,W,F  . Chlorhexidine Gluconate Cloth  6 each Topical Q0600  . Chlorhexidine Gluconate Cloth  6 each Topical Q0600  . cinacalcet  60 mg Oral BID WC  . digoxin  62.5 mcg Oral Daily  . diltiazem  120 mg Oral Daily  . dorzolamide-timolol  1 drop Both Eyes BID  . doxercalciferol  3 mcg Intravenous Q M,W,F-HD  . feeding supplement (NEPRO CARB STEADY)  237 mL Oral BID BM  . multivitamin  1 tablet Oral QHS  . pantoprazole  40 mg Oral Daily  . polyethylene glycol  17 g Oral Daily  . pravastatin  10 mg Oral Daily  . predniSONE  5 mg Oral QHS  . sertraline  50 mg Oral Daily  . sevelamer carbonate  1,600 mg Oral TID WC  . warfarin  3 mg Oral ONCE-1800  . Warfarin - Pharmacist Dosing Inpatient   Does not apply q1800   Continuous Infusions: . sodium chloride Stopped (10/29/17 0559)     LOS: 4 days    Time spent: 25 mins.More than 50% of that time was spent in counseling and/or coordination of care.      Shelly Coss, MD [facility] Pager [phone]  If 7PM-7AM, please contact night-coverage [email or web address] Password [email or web address] 11/02/2017, 2:00 PM

## 2017-11-02 NOTE — Progress Notes (Signed)
Pt transported to HD by [facility].

## 2017-11-02 NOTE — Progress Notes (Cosign Needed)
[facility] for warfarin Indication: hx atrial fibrillation  Allergies  Allergen Reactions  . Penicillins Swelling and Rash    Has patient had a PCN reaction causing immediate rash, facial/tongue/throat swelling, SOB or lightheadedness with hypotension: Yes Has patient had a PCN reaction causing severe rash involving mucus membranes or skin necrosis: No Has patient had a PCN reaction that required hospitalization: No Has patient had a PCN reaction occurring within the last 10 years: No If all of the above answers are "NO", then may proceed with Cephalosporin use.   . Atorvastatin     MYALGIA   . Codeine Nausea Only  . Tramadol Nausea Only    Patient Measurements: Height: 5\' 10"  (177.8 cm) Weight: 190 lb 4.1 oz (86.3 kg)(stood to scale ) IBW/kg (Calculated) : 73  Vital Signs: Temp: 97.6 F (36.4 C) (08/07 1051) Temp Source: Oral (08/07 1051) BP: 160/72 (08/07 1051) Pulse Rate: 64 (08/07 1051)  Labs: Recent Labs    10/31/17 0457  10/31/17 1119 11/01/17 0501 11/01/17 1605 11/02/17 0434  HGB  --    < > 12.1*  --  11.5* 11.1*  HCT  --   --  39.1  --  36.4* 36.9*  PLT  --   --  213  --  207 191  LABPROT 27.1*  --   --  25.9*  --  22.7*  INR 2.54  --   --  2.39  --  2.02  CREATININE  --   --  10.12*  --  6.73* 7.61*   < > = values in this interval not displayed.    Estimated Creatinine Clearance: 8.4 mL/min (A) (by C-G formula based on SCr of 7.61 mg/dL (H)).   Medications:  Per outpt AC clinic note on 7/31--> INR 1.6, dose increased to 7.5 mg on 7/31 and then resume 5 mg daily except 2.5 mg on MWF (last dose taken on 8/1 per daughter)  Assessment: Alexander [name] is a 77 y.o. male with ESRD on HD MWF, hematuria, and afib on warfarin PTA presented to the ED on 10/28/17 with c/o lethargy and no appetite.  Pt's daughter reported that he saw his urologist Dr. Pilar Jarvis about two weeks ago for workup of hematuria.  Dr. Pilar Jarvis suspected that  hematuria was secondary to UTI and referred pt to the [facility] for treatment of infection. Patient started on Fluconazole 8/4 x1 dose.  Continuing warfarin inpatient.  8/7- INR is therapeutic at 2.02. Plan for warfarin 3mg  PO x1.  Goal of Therapy:  INR 2-3 Monitor platelets by anticoagulation protocol: Yes   Plan:  - warfarin 3mg  PO x1 - daily INR - monitor for s/s bleeding especially for severity of hematuria  [name], PharmD PGY1 Pharmacy Resident Direct Phone: [phone] 11/02/2017  11:30 AM

## 2017-11-03 NOTE — Progress Notes (Signed)
Remote pacemaker transmission.   

## 2017-11-04 ENCOUNTER — Telehealth: Payer: Self-pay | Admitting: Gastroenterology

## 2017-11-04 DIAGNOSIS — B961 Klebsiella pneumoniae [K. pneumoniae] as the cause of diseases classified elsewhere: Secondary | ICD-10-CM | POA: Diagnosis not present

## 2017-11-04 DIAGNOSIS — D631 Anemia in chronic kidney disease: Secondary | ICD-10-CM | POA: Diagnosis not present

## 2017-11-04 DIAGNOSIS — N186 End stage renal disease: Secondary | ICD-10-CM | POA: Diagnosis not present

## 2017-11-04 DIAGNOSIS — I482 Chronic atrial fibrillation: Secondary | ICD-10-CM | POA: Diagnosis not present

## 2017-11-04 DIAGNOSIS — B955 Unspecified streptococcus as the cause of diseases classified elsewhere: Secondary | ICD-10-CM | POA: Diagnosis not present

## 2017-11-04 DIAGNOSIS — N2581 Secondary hyperparathyroidism of renal origin: Secondary | ICD-10-CM | POA: Diagnosis not present

## 2017-11-04 NOTE — Telephone Encounter (Signed)
The pt last saw Alexander [name].  

## 2017-11-04 NOTE — Telephone Encounter (Signed)
Pt scheduled to see Dr. Fuller Plan 11/17/17@11 :15am, please notify pt of appt.

## 2017-11-07 ENCOUNTER — Other Ambulatory Visit: Payer: Self-pay | Admitting: Cardiovascular Disease

## 2017-11-07 DIAGNOSIS — N2581 Secondary hyperparathyroidism of renal origin: Secondary | ICD-10-CM | POA: Diagnosis not present

## 2017-11-07 DIAGNOSIS — B955 Unspecified streptococcus as the cause of diseases classified elsewhere: Secondary | ICD-10-CM | POA: Diagnosis not present

## 2017-11-07 DIAGNOSIS — D631 Anemia in chronic kidney disease: Secondary | ICD-10-CM | POA: Diagnosis not present

## 2017-11-07 DIAGNOSIS — N186 End stage renal disease: Secondary | ICD-10-CM | POA: Diagnosis not present

## 2017-11-07 DIAGNOSIS — B961 Klebsiella pneumoniae [K. pneumoniae] as the cause of diseases classified elsewhere: Secondary | ICD-10-CM | POA: Diagnosis not present

## 2017-11-08 ENCOUNTER — Telehealth: Payer: Self-pay | Admitting: Cardiovascular Disease

## 2017-11-08 DIAGNOSIS — N390 Urinary tract infection, site not specified: Secondary | ICD-10-CM | POA: Diagnosis not present

## 2017-11-08 DIAGNOSIS — D638 Anemia in other chronic diseases classified elsewhere: Secondary | ICD-10-CM | POA: Diagnosis not present

## 2017-11-08 NOTE — Telephone Encounter (Signed)
ok 

## 2017-11-08 NOTE — Telephone Encounter (Signed)
Spoke with pt's daughter and pt has a lot of health issues going weak from hospital stay and  lesion on kidney as well as mass noted in colon Pt was suppose to have dig level today  instructed daughter to wait and get next week when has f/u with Dr Oval Linsey.Pt's daughter agrees. Will let Dr Oval Linsey know ,.cy

## 2017-11-08 NOTE — Telephone Encounter (Signed)
New Message     Patients daughter [name] is calling on behalf of patient. She states that the patient is to come in for labs. I am only showing  Labs for [facility]. Just checking to see if there are any other labs that needs to be done. Please call.

## 2017-11-09 DIAGNOSIS — D631 Anemia in chronic kidney disease: Secondary | ICD-10-CM | POA: Diagnosis not present

## 2017-11-09 DIAGNOSIS — B955 Unspecified streptococcus as the cause of diseases classified elsewhere: Secondary | ICD-10-CM | POA: Diagnosis not present

## 2017-11-09 DIAGNOSIS — N2581 Secondary hyperparathyroidism of renal origin: Secondary | ICD-10-CM | POA: Diagnosis not present

## 2017-11-09 DIAGNOSIS — B961 Klebsiella pneumoniae [K. pneumoniae] as the cause of diseases classified elsewhere: Secondary | ICD-10-CM | POA: Diagnosis not present

## 2017-11-09 DIAGNOSIS — N186 End stage renal disease: Secondary | ICD-10-CM | POA: Diagnosis not present

## 2017-11-11 ENCOUNTER — Ambulatory Visit (INDEPENDENT_AMBULATORY_CARE_PROVIDER_SITE_OTHER): Payer: Medicare Other | Admitting: Pharmacist

## 2017-11-11 DIAGNOSIS — B955 Unspecified streptococcus as the cause of diseases classified elsewhere: Secondary | ICD-10-CM | POA: Diagnosis not present

## 2017-11-11 DIAGNOSIS — I4891 Unspecified atrial fibrillation: Secondary | ICD-10-CM

## 2017-11-11 DIAGNOSIS — Z7901 Long term (current) use of anticoagulants: Secondary | ICD-10-CM | POA: Diagnosis not present

## 2017-11-11 DIAGNOSIS — D631 Anemia in chronic kidney disease: Secondary | ICD-10-CM | POA: Diagnosis not present

## 2017-11-11 DIAGNOSIS — N186 End stage renal disease: Secondary | ICD-10-CM | POA: Diagnosis not present

## 2017-11-11 DIAGNOSIS — N2581 Secondary hyperparathyroidism of renal origin: Secondary | ICD-10-CM | POA: Diagnosis not present

## 2017-11-11 DIAGNOSIS — B961 Klebsiella pneumoniae [K. pneumoniae] as the cause of diseases classified elsewhere: Secondary | ICD-10-CM | POA: Diagnosis not present

## 2017-11-11 LAB — POCT INR: INR: 2.1 (ref 2.0–3.0)

## 2017-11-11 NOTE — Patient Instructions (Signed)
Description   Continue with 1 tablet daily except 1/2 tablet each Monday, Wednesday and Friday.  Repeat INR in 3 weeks

## 2017-11-14 DIAGNOSIS — B961 Klebsiella pneumoniae [K. pneumoniae] as the cause of diseases classified elsewhere: Secondary | ICD-10-CM | POA: Diagnosis not present

## 2017-11-14 DIAGNOSIS — N2581 Secondary hyperparathyroidism of renal origin: Secondary | ICD-10-CM | POA: Diagnosis not present

## 2017-11-14 DIAGNOSIS — B955 Unspecified streptococcus as the cause of diseases classified elsewhere: Secondary | ICD-10-CM | POA: Diagnosis not present

## 2017-11-14 DIAGNOSIS — D631 Anemia in chronic kidney disease: Secondary | ICD-10-CM | POA: Diagnosis not present

## 2017-11-14 DIAGNOSIS — N186 End stage renal disease: Secondary | ICD-10-CM | POA: Diagnosis not present

## 2017-11-15 ENCOUNTER — Encounter: Payer: Self-pay | Admitting: Cardiovascular Disease

## 2017-11-15 ENCOUNTER — Ambulatory Visit (INDEPENDENT_AMBULATORY_CARE_PROVIDER_SITE_OTHER): Payer: Medicare Other | Admitting: Cardiovascular Disease

## 2017-11-15 VITALS — BP 140/62 | HR 62 | Ht 70.5 in | Wt 201.8 lb

## 2017-11-15 DIAGNOSIS — I4821 Permanent atrial fibrillation: Secondary | ICD-10-CM

## 2017-11-15 DIAGNOSIS — E7800 Pure hypercholesterolemia, unspecified: Secondary | ICD-10-CM | POA: Diagnosis not present

## 2017-11-15 DIAGNOSIS — I10 Essential (primary) hypertension: Secondary | ICD-10-CM

## 2017-11-15 DIAGNOSIS — I482 Chronic atrial fibrillation: Secondary | ICD-10-CM

## 2017-11-15 DIAGNOSIS — R001 Bradycardia, unspecified: Secondary | ICD-10-CM | POA: Diagnosis not present

## 2017-11-15 DIAGNOSIS — Z952 Presence of prosthetic heart valve: Secondary | ICD-10-CM

## 2017-11-15 NOTE — Patient Instructions (Addendum)
Medication Instructions:  REDUCE YOUR GABAPENTIN TO 100 MG TWICE A DAY   Labwork: NONE  Testing/Procedures: NONE  Follow-Up: Your physician recommends that you schedule a follow-up appointment in: 4 MONTHS   Any Other Special Instructions Will Be Listed Below (If Applicable). EITHER CALL OR SEND LETTER TO [facility] WITH ANY COMMENTS   If you need a refill on your cardiac medications before your next appointment, please call your pharmacy.

## 2017-11-15 NOTE — Progress Notes (Signed)
Cardiology Office Note   Date:  11/15/2017   ID:  CORNELUIS ALLSTON, DOB 1940/06/08, MRN [id number]  PCP:  Deland Pretty, MD  Cardiologist:   Skeet Latch, MD  Electrophysiologist: Allegra Lai, MD  No chief complaint on file.   History of Present Illness: KOKI BUXTON is a 77 y.o. male with severe aortic stenosis s/p TAVR, ESRD on HD, hypotension on midodrine, diabetes mellitus, hyperlipidemia, carotid artery disease with complete occlusion of the L carotid, CVA, and paroxysmal atrial fibrillation  who presents for follow up.  He was initially evaluated on 12/04/14 with hypotension requiring midodrine and syncope.  He had an echo that revealed LVEF 60-65% with grade 2 diastolic dysfunction, moderate AS and mild MR. He wore a 48 hour Holter 11/2014 that revealed episodes of dizziness associated with heart rates in the 50s. He had a PPM implanted 01/2015.  He had a repeat monitor 12/2014 that showed atrial fibrillation with occasional junctional escape.  He subsequently had a Lexiscan Cardiolite 01/2015 that was negative for ischemia but showed an area of distal inferior infarct vs artifact.  He saw Dr. Curt Bears on 10/06/15 where he was found to be in persistent atrial fibrillation.  He went into sinus rhythm briefly after DCCV with no appreciable change in symptoms.  [name] underwent placement of a 29 mm Sapien 3 TAVR on 08/2016.  Echo on 10/07/16 revealed LVEF 55-60% with mild LVH. The prostatic valve was functioning properly without any significant stenosis or regurgitation. 10/2016 he reported significant fatigue and shortness of breath. He had a chest x-ray 11/11/16 that revealed mild cardiomegaly and tiny pleural effusions consistent with mild CHF. BNP was 18,000.  His dry weight was lowered with HD with improvement in symptoms.  Mr. Velazco was admitted to the hospital 11/2016 with sepsis 2/2 Strep bacteremia.  The source was unclear, but presumed to be 2/2 HD access.  He had a CT of the  spine that showed no evidence of discitis or abscess.  He also had a TEE 12/01/16 that revealed no vegetations on his PPM lead and normal TAVR valve function.  There was moderate TR and moderate MR.  He has continued to struggle with low BP during HD.  Diltiazem was discontinued and he was started on digoxin.  He had an episode of syncope that was attributed to orthostasis.  He had carotid Dopplers 04/2017 that were unchanged from prior and (100% L ICA stenosis 4-59% R).  No arrhythmias were noted on his ICD.  Metoprolol was reduced and he was started on pravastatin.  He followed up with Dr. Curt Bears on 05/2017 and had evidence of atrial fibrillation with rapid ventricular response on his pacemaker.  Metoprolol was discontinued and he was started on diltiazem 120 mg daily. At his last appointment Mr. Pohle was feeling well.  His blood pressure was running in the 120s in the mornings.  Very rarely drops below 110 during dialysis.   Since his last appointment Mr. Jawad was admitted 10/2017 with an antibiotic resistent Enterococcus UTI.  He was treated with vanomycin with dialysis but has persistent dysuria so he was admitted for IV fosfomycin.  That hospitalization was comp gated by an episode of altered mental status.  He had an EEG that did not show any seizure activity.  It was thought to be a TIA.  Since leaving the hospital Mr. Altamura has been feeling well.  He was started on gabapentin which makes him feel drunk.  However it has helped  with his neuropathy.  It is still present but much more tolerable.  His blood pressure has been stable.  At times he gets his high as 180 before dialysis.  However before he leaves it is down to 120s.  He has not had any issues with low blood pressures and reports that he is feeling the best he has in years.  In general his blood pressure is running in the 140s to 150s.  He has not noted any lower extremity edema and continues to wear compression socks daily.  He denies orthopnea  or PND.  He also has not expands any chest pain or pressure.   Past Medical History:  Diagnosis Date  . Anemia   . Aortic stenosis 06/15/12   TEE - EF 97-35%; grade 1 diastolic dysfunction; mild/mod aortic valve stenosis; Mitral valve had calcified annulus, mild pulm htn PA peak pressure 62mmHg  . Barrett's esophagus 05/2003  . Bradycardia 2017   [employer] 2240 Assurity dual-lead pacemaker  . Carpal tunnel syndrome, bilateral 11/03/2015  . Colon polyps   . CVA (cerebral infarction)    2004/affected left side  . Depression   . Diabetes mellitus without complication ([place])   . Diabetic peripheral neuropathy ([place]) 10/02/2015  . Diverticulosis   . Dyspnea    with exertion  . ESRD (end stage renal disease) on dialysis ([place])    "[facility]; [place]; MWF" (05/12/2017)  . Failure to thrive syndrome, adult 10/30/2017  . GERD (gastroesophageal reflux disease)   . Glaucoma   . History of kidney stones   . Hyperlipidemia   . Hypertension   . Legally blind   . Macular degeneration    both eyes  . Orthostatic hypotension 09/09/2015  . Paroxysmal atrial fibrillation ([facility])   . Peptic ulcer    bleeding, 1969  . Presence of permanent cardiac pacemaker   . S/P epidural steroid injection    last  injection over 10 years ago  . Seasonal allergies   . Tubular adenoma of colon 07/2001    Past Surgical History:  Procedure Laterality Date  . AV FISTULA PLACEMENT  2009  . BACK SURGERY    . CARDIOVERSION N/A 11/13/2015   Procedure: CARDIOVERSION;  Surgeon: Troy Sine, MD;  Location: [facility] ENDOSCOPY;  Service: Cardiovascular;  Laterality: N/A;  . CARDIOVERSION N/A 01/13/2016   Procedure: CARDIOVERSION;  Surgeon: Will Meredith [name], MD;  Location: [place];  Service: Cardiovascular;  Laterality: N/A;  . [place]   right eye  . CYSTOSCOPY  several times   kidney stones  . EP IMPLANTABLE DEVICE N/A 03/11/2015   Procedure: Pacemaker Implant;  Surgeon: Will Meredith [name], MD;  [place];  Laterality: Left  . LAMINECTOMY  1969  . RIGHT/LEFT HEART CATH AND CORONARY ANGIOGRAPHY N/A 08/20/2016   Procedure: Right/Left Heart Cath and Coronary Angiography;  Surgeon: Burnell Blanks, MD;  Location: [place] CV LAB;  Service: Cardiovascular;  Laterality: N/A;  . TEE WITHOUT CARDIOVERSION N/A 07/22/2016   Procedure: TRANSESOPHAGEAL ECHOCARDIOGRAM (TEE);  Surgeon: Skeet Latch, MD;  Location: [place];  Service: Cardiovascular;  Laterality: N/A;  . TEE WITHOUT CARDIOVERSION N/A 08/31/2016   Procedure: TRANSESOPHAGEAL ECHOCARDIOGRAM (TEE);  Surgeon: Burnell Blanks, MD;  Location: [place];  Service: Open Heart Surgery;  Laterality: N/A;  . TEE WITHOUT CARDIOVERSION N/A 12/01/2016   Procedure: TRANSESOPHAGEAL ECHOCARDIOGRAM (TEE);  Surgeon: Larey Dresser, MD;  Location: [facility] ENDOSCOPY;  Service: Cardiovascular;  Laterality: N/A;  .  TONSILLECTOMY  1964  . TRANSCATHETER AORTIC VALVE REPLACEMENT, TRANSFEMORAL N/A 08/31/2016   Procedure: TRANSCATHETER AORTIC VALVE REPLACEMENT, TRANSFEMORAL;  Surgeon: Burnell Blanks, MD;  Location: [place];  Service: Open Heart Surgery;  Laterality: N/A;     Current Outpatient Medications  Medication Sig Dispense Refill  . acetaminophen (TYLENOL) 325 MG tablet Take 975 mg by mouth daily as needed for headache.     . b complex-vitamin c-folic acid (NEPHRO-VITE) 0.8 MG TABS Take 1 tablet by mouth daily.     . bromocriptine (PARLODEL) 5 MG capsule Take 5 mg by mouth See admin instructions. Monday, Wednesday and Friday only    . cetirizine (ZYRTEC) 10 MG tablet Take 10 mg by mouth at bedtime.     . cinacalcet (SENSIPAR) 30 MG tablet Take 2 tablets (60 mg total) by mouth 2 (two) times daily with a meal. 60 tablet 0  . DIGOX 125 MCG tablet TAKE 1/2 TABLET BY MOUTH EVERY DAY 15 tablet 4  . diltiazem (CARDIZEM CD) 120 MG 24 hr capsule Take 120 mg by mouth daily.    . dorzolamide-timolol (COSOPT) 22.3-6.8 MG/ML  ophthalmic solution Place 1 drop into both eyes 2 (two) times daily.     [name] gabapentin (NEURONTIN) 100 MG capsule Take 100 mg by mouth 2 (two) times daily.    . Nutritional Supplements (NEPRO) LIQD Take 237 mLs by mouth every Monday, Wednesday, and Friday with hemodialysis.     [name] omeprazole (PRILOSEC) 20 MG capsule Take 20 mg by mouth daily.    . polyethylene glycol (MIRALAX / GLYCOLAX) packet Take 17 g by mouth daily. 14 each 0  . pravastatin (PRAVACHOL) 10 MG tablet Take 1 tablet (10 mg total) by mouth daily. 30 tablet 0  . predniSONE (DELTASONE) 5 MG tablet Take 5 mg by mouth at bedtime.   1  . sertraline (ZOLOFT) 50 MG tablet Take 50 mg by mouth daily.  5  . sevelamer carbonate (RENVELA) 800 MG tablet Take 2 tablets (1,600 mg total) by mouth 3 (three) times daily with meals. 90 tablet 0  . Tiotropium Bromide-Olodaterol (STIOLTO RESPIMAT) 2.5-2.5 MCG/ACT AERS Inhale 2 puffs into the lungs daily. 1 Inhaler 5  . warfarin (COUMADIN) 5 MG tablet Take 1/2 to 1 tablet daily as directed by coumadin clinic 90 tablet 0   No current facility-administered medications for this visit.     Allergies:   Penicillins; Atorvastatin; Codeine; and Tramadol    Social History:  The patient  reports that he quit smoking about 19 years ago. His smoking use included cigarettes. He has a 90.00 pack-year smoking history. He has never used smokeless tobacco. He reports that he does not drink alcohol or use drugs.    Family History:  The patient's family history includes Cancer in his brother; Heart attack in his father; Heart disease in his sister; Hypertension in his father; Stomach cancer in his mother; Stroke in his sister.    ROS:  Please see the history of present illness.   Otherwise, review of systems are positive for leg spasm.   All other systems are reviewed and negative.    PHYSICAL EXAM: VS:  BP 140/62   Pulse 62   Ht 5' 10.5" (1.791 m)   Wt 201 lb 12.8 oz (91.5 kg)   SpO2 96%   BMI 28.55 kg/m  ,  BMI Body mass index is 28.55 kg/m. GENERAL:  Chronically ill-appearing.  No acute distress.  HEENT: Pupils equal round and reactive, fundi not visualized,  oral mucosa unremarkable NECK:  No jugular venous distention, waveform within normal limits, carotid upstroke brisk and symmetric, no bruits LUNGS:  Clear to auscultation bilaterally HEART:  RRR.  PMI not displaced or sustained,S1 and S2 within normal limits, no S3, no S4, no clicks, no rubs, II/VI systolic murmur at the LUSB ABD:  Flat, positive bowel sounds normal in frequency in pitch, no bruits, no rebound, no guarding, no midline pulsatile mass, no hepatomegaly, no splenomegaly EXT:  2 plus pulses throughout, no edema, no cyanosis no clubbing.  L LE fistula +bruit SKIN:  No rashes no nodules NEURO:  Cranial nerves II through XII grossly intact, motor grossly intact throughout PSYCH:  Cognitively intact, oriented to person place and time   EKG:  EKG is not ordered today. 09/09/15: Atrial fibrillation rate 108 bpm.  Prior inferior infarct. 01/16/15: Sinus bradycardia.  First degree heart block.  LAFB.  U waves. The ekg from 11/19/14 demonstrates sinus bradycardia at 49 bpm. First degree heart block.  08/06/16: Atrial fibrillation.  Rate 82 bpm.  Occcasional VP. 11/11/16: Atrial fibrillation. Rate 97 bpm. Left bundle branch block.  03/15/17: Atrial fibrillation  Rate 105 bpm.  LBBB.  PVC  Lexiscan Cardiolite 01/28/15:  Nuclear stress EF: 67%.  The left ventricular ejection fraction is hyperdynamic (>65%).  There was no ST segment deviation noted during stress.  This is a low risk study.  Low risk stress nuclear study with a small, severe, predominantly fixed distal inferior defect consistent with small prior infarct vs thinning; no significant ischemia; EF 67 with normal wall motion.  Echo 10/07/16: Study Conclusions  - Left ventricle: The cavity size was normal. Wall thickness was   increased in a pattern of mild LVH.  Systolic function was normal.   The estimated ejection fraction was in the range of 55% to 60%.   Wall motion was normal; there were no regional wall motion   abnormalities. Doppler parameters are consistent with high   ventricular filling pressure. - Aortic valve: A bioprosthesis was present. - Mitral valve: Calcified annulus. There was mild regurgitation.   Valve area by pressure half-time: 2.08 cm^2. - Left atrium: The atrium was mildly dilated. - Right atrium: The atrium was moderately dilated. - Pulmonary arteries: Systolic pressure was mildly increased. PA   peak pressure: 36 mm Hg (S).  Impressions:  - Normal LV systolic function; mild LVH; elevated LV filling   pressure; s/p AVR with normal mean gradient (9 mmHg) and   calculated AVA of 1.4 cm2; no AI; mild MR; biatrial enlargement;   mild TR with mildly elevated pulmonary pressure.  Recent Labs: 05/13/2017: Magnesium 1.8; TSH 1.526 10/28/2017: ALT 24 11/02/2017: BUN 33; Creatinine, Ser 7.61; Hemoglobin 11.1; Platelets 191; Potassium 4.1; Sodium 139    Lipid Panel    Component Value Date/Time   CHOL 147 08/23/2017 0916   TRIG 236 (H) 08/23/2017 0916   HDL 26 (L) 08/23/2017 0916   CHOLHDL 5.7 (H) 08/23/2017 0916   CHOLHDL 7.5 05/14/2017 0514   VLDL 55 (H) 05/14/2017 0514   LDLCALC 74 08/23/2017 0916      Wt Readings from Last 3 Encounters:  11/15/17 201 lb 12.8 oz (91.5 kg)  11/02/17 190 lb 4.1 oz (86.3 kg)  08/25/17 198 lb (89.8 kg)     Other studies Reviewed: Additional studies/ records that were reviewed today include: [facility] records Review of the above records demonstrates:  Please see elsewhere in the note.     ASSESSMENT AND PLAN:  #  Severe aortic stenosis s/p TAVR:  # Shortness of breath: TAVR is functioning properly.  Symptoms have improved and TAVR looked good on echo 11/2016.  No changes.   # Hypotension/syncope:  # Hypertension: His BP is running high but he is much more stable.  He has not needed  to take any Midrin.  In fact his blood pressures running on the high side.  However, given his long, complicated history with hypotension and inability to get through dialysis sessions without taking high doses of Midodrin, we will not make any changes at this time.  We did discuss that the there are with associated with chronic hypertension.  However, I think the risk of hypotension outweigh the risk of his high blood pressure.  Continue with diltiazem.  # Bradycardia/longstanding persistent atrial fibrillation:  Chronic atrial fibrillation.  Now on digoxin and diltiazem.  Continue warfarin.    # Hyperlipidemia: He had myalgias on atorvastatin but is tolerating pravastatin much better.  LDL was 74 on 07/2017.  Continue pravastatin.  # Peripheral neuropathy:  Reduce gabapentin to 200mg  given that is is making him feel drunk.  If his neuropathy worsens he can increase it back to 300 mg nightly.  Current medicines are reviewed at length with the patient today.  The patient does not have concerns regarding medicines.  The following changes have been made: none  Labs/ tests ordered today include:   No orders of the defined types were placed in this encounter.    Disposition:   FU with Dr. Jonelle Sidle C. [place] in 4 months.   Signed, Skeet Latch, MD  11/15/2017 11:31 AM    [place]

## 2017-11-16 DIAGNOSIS — D631 Anemia in chronic kidney disease: Secondary | ICD-10-CM | POA: Diagnosis not present

## 2017-11-16 DIAGNOSIS — B955 Unspecified streptococcus as the cause of diseases classified elsewhere: Secondary | ICD-10-CM | POA: Diagnosis not present

## 2017-11-16 DIAGNOSIS — N2581 Secondary hyperparathyroidism of renal origin: Secondary | ICD-10-CM | POA: Diagnosis not present

## 2017-11-16 DIAGNOSIS — N186 End stage renal disease: Secondary | ICD-10-CM | POA: Diagnosis not present

## 2017-11-16 DIAGNOSIS — B961 Klebsiella pneumoniae [K. pneumoniae] as the cause of diseases classified elsewhere: Secondary | ICD-10-CM | POA: Diagnosis not present

## 2017-11-17 ENCOUNTER — Encounter: Payer: Self-pay | Admitting: Gastroenterology

## 2017-11-17 ENCOUNTER — Telehealth: Payer: Self-pay

## 2017-11-17 ENCOUNTER — Ambulatory Visit (INDEPENDENT_AMBULATORY_CARE_PROVIDER_SITE_OTHER): Payer: Medicare Other | Admitting: Gastroenterology

## 2017-11-17 VITALS — BP 126/72 | HR 64 | Ht 70.5 in | Wt 198.0 lb

## 2017-11-17 DIAGNOSIS — G629 Polyneuropathy, unspecified: Secondary | ICD-10-CM | POA: Diagnosis not present

## 2017-11-17 DIAGNOSIS — G894 Chronic pain syndrome: Secondary | ICD-10-CM | POA: Diagnosis not present

## 2017-11-17 DIAGNOSIS — M79604 Pain in right leg: Secondary | ICD-10-CM | POA: Diagnosis not present

## 2017-11-17 DIAGNOSIS — Z7901 Long term (current) use of anticoagulants: Secondary | ICD-10-CM

## 2017-11-17 DIAGNOSIS — R933 Abnormal findings on diagnostic imaging of other parts of digestive tract: Secondary | ICD-10-CM

## 2017-11-17 DIAGNOSIS — Z86010 Personal history of colonic polyps: Secondary | ICD-10-CM | POA: Diagnosis not present

## 2017-11-17 DIAGNOSIS — M47816 Spondylosis without myelopathy or radiculopathy, lumbar region: Secondary | ICD-10-CM | POA: Diagnosis not present

## 2017-11-17 MED ORDER — PEG-KCL-NACL-NASULF-NA ASC-C 140 G PO SOLR: 1.0000 | Freq: Once | ORAL | 0 refills | Status: AC

## 2017-11-17 NOTE — Telephone Encounter (Signed)
Routing to pharmacy for coumadin recommendations.

## 2017-11-17 NOTE — Telephone Encounter (Signed)
Discussed with Dr Oval Linsey and dig level not needed at this time

## 2017-11-17 NOTE — Telephone Encounter (Signed)
Faxing pharmacy recommendations to requesting provider. Will remove from preop pool.

## 2017-11-17 NOTE — Telephone Encounter (Signed)
Pt takes warfarin for afib with CHADS2VASc score of 7 (age x2, HTN, DM CAD/PAD, CVA). Pt on ESRD. He will require bridging with Lovenox 1mg /kg once daily in order to hold his warfarin for 5 days prior to his colonoscopy. Will coordinate Lovenox bridge in Coumadin clinic.

## 2017-11-17 NOTE — Patient Instructions (Signed)
You have been scheduled for a colonoscopy. Please follow written instructions given to you at your visit today.  Please pick up your prep supplies at the pharmacy within the next 1-3 days. If you use inhalers (even only as needed), please bring them with you on the day of your procedure. Your physician has requested that you go to www.startemmi.com and enter the access code given to you at your visit today. This web site gives a general overview about your procedure. However, you should still follow specific instructions given to you by our office regarding your preparation for the procedure.  Thank you for choosing me and [facility].  Malcolm T. Stark, Jr., MD., FACG  

## 2017-11-17 NOTE — Telephone Encounter (Signed)
 [facility] Pre-operative Risk Assessment     Request for surgical clearance:     Endoscopy Procedure  What type of surgery is being performed?     Colonoscopy  When is this surgery scheduled?     01/03/18  What type of clearance is required ?   Pharmacy  Are there any medications that need to be held prior to surgery and how long? Coumadin x 5 days  Practice name and name of physician performing surgery?      [facility]  What is your office phone and fax number?      Phone- [phone]  [phone]  Anesthesia type (None, local, MAC, general) ?       MAC

## 2017-11-17 NOTE — Progress Notes (Signed)
History of Present Illness: This is a 77 year old male referred by Alcide Evener, MD for the evaluation of an abnormal CT of the rectum. He is accompanied by his wife and daughter.  He has had intermittent problems with small amounts of hematochezia associated with bowel movements.  CT scan as below.  Status post TAVR last summer. He is legally blind.  He has mild constipation that is adequately managed with daily prunes.  He did not return for recommended surveillance colonoscopy in December 2017. Denies weight loss, abdominal pain, diarrhea, change in stool caliber, melena, nausea, vomiting, dysphagia, reflux symptoms, chest pain.  Abd/pelvic CT 10/2017 1. Suggestion of slight rectal prolapse with prominence of the soft tissues at the anus. This could be due to hemorrhoids but I can't exclude a mass. 2. Small enhancing lesion in the right posterolateral aspect of the bladder as well as slight enhancement of the mucosa of the dome of the bladder. Possibility of bladder neoplasm should be considered. 3. Extensive diverticulosis of the sigmoid portion of the colon without discrete diverticulitis. 4. Aortic Atherosclerosis (ICD10-I70.0). 5. Small nonspecific left pleural effusion, minimally increased since 2018.   Allergies  Allergen Reactions  . Penicillins Swelling and Rash    Has patient had a PCN reaction causing immediate rash, facial/tongue/throat swelling, SOB or lightheadedness with hypotension: Yes Has patient had a PCN reaction causing severe rash involving mucus membranes or skin necrosis: No Has patient had a PCN reaction that required hospitalization: No Has patient had a PCN reaction occurring within the last 10 years: No If all of the above answers are "NO", then may proceed with Cephalosporin use.   . Atorvastatin     MYALGIA   . Codeine Nausea Only  . Tramadol Nausea Only   Outpatient Medications Prior to Visit  Medication Sig Dispense Refill  . acetaminophen  (TYLENOL) 325 MG tablet Take 975 mg by mouth daily as needed for headache.     . b complex-vitamin c-folic acid (NEPHRO-VITE) 0.8 MG TABS Take 1 tablet by mouth daily.     . bromocriptine (PARLODEL) 5 MG capsule Take 5 mg by mouth See admin instructions. Monday, Wednesday and Friday only    . cetirizine (ZYRTEC) 10 MG tablet Take 10 mg by mouth at bedtime.     . cinacalcet (SENSIPAR) 30 MG tablet Take 2 tablets (60 mg total) by mouth 2 (two) times daily with a meal. 60 tablet 0  . DIGOX 125 MCG tablet TAKE 1/2 TABLET BY MOUTH EVERY DAY 15 tablet 4  . diltiazem (CARDIZEM CD) 120 MG 24 hr capsule Take 120 mg by mouth daily.    . dorzolamide-timolol (COSOPT) 22.3-6.8 MG/ML ophthalmic solution Place 1 drop into both eyes 2 (two) times daily.     [name] gabapentin (NEURONTIN) 100 MG capsule Take 100 mg by mouth 2 (two) times daily.    . Nutritional Supplements (NEPRO) LIQD Take 237 mLs by mouth every Monday, Wednesday, and Friday with hemodialysis.     [name] omeprazole (PRILOSEC) 20 MG capsule Take 20 mg by mouth daily.    . pravastatin (PRAVACHOL) 10 MG tablet Take 1 tablet (10 mg total) by mouth daily. 30 tablet 0  . predniSONE (DELTASONE) 5 MG tablet Take 5 mg by mouth at bedtime.   1  . sertraline (ZOLOFT) 50 MG tablet Take 50 mg by mouth daily.  5  . sevelamer carbonate (RENVELA) 800 MG tablet Take 2 tablets (1,600 mg total) by mouth 3 (three) times  daily with meals. 90 tablet 0  . Tiotropium Bromide-Olodaterol (STIOLTO RESPIMAT) 2.5-2.5 MCG/ACT AERS Inhale 2 puffs into the lungs daily. 1 Inhaler 5  . warfarin (COUMADIN) 5 MG tablet Take 1/2 to 1 tablet daily as directed by coumadin clinic 90 tablet 0  . polyethylene glycol (MIRALAX / GLYCOLAX) packet Take 17 g by mouth daily. 14 each 0   No facility-administered medications prior to visit.    Past Medical History:  Diagnosis Date  . Anemia   . Aortic stenosis 06/15/12   TEE - EF 41-93%; grade 1 diastolic dysfunction; mild/mod aortic valve stenosis;  Mitral valve had calcified annulus, mild pulm htn PA peak pressure 72mmHg  . Barrett's esophagus 05/2003  . Bradycardia 2017   [employer] 2240 Assurity dual-lead pacemaker  . Carpal tunnel syndrome, bilateral 11/03/2015  . Colon polyps   . CVA (cerebral infarction)    2004/affected left side  . Depression   . Diabetes mellitus without complication ([place])   . Diabetic peripheral neuropathy ([place]) 10/02/2015  . Diverticulosis   . Dyspnea    with exertion  . ESRD (end stage renal disease) on dialysis ([place])    "[facility]; [place]; MWF" (05/12/2017)  . Failure to thrive syndrome, adult 10/30/2017  . GERD (gastroesophageal reflux disease)   . Glaucoma   . History of kidney stones   . Hyperlipidemia   . Hypertension   . Legally blind   . Macular degeneration    both eyes  . Orthostatic hypotension 09/09/2015  . Paroxysmal atrial fibrillation ([facility])   . Peptic ulcer    bleeding, 1969  . Presence of permanent cardiac pacemaker   . S/P epidural steroid injection    last  injection over 10 years ago  . Seasonal allergies   . Tubular adenoma of colon 07/2001   Past Surgical History:  Procedure Laterality Date  . AV FISTULA PLACEMENT  2009  . BACK SURGERY    . CARDIOVERSION N/A 11/13/2015   Procedure: CARDIOVERSION;  Surgeon: Troy Sine, MD;  [facility] ENDOSCOPY;  Service: Cardiovascular;  Laterality: N/A;  . CARDIOVERSION N/A 01/13/2016   Procedure: CARDIOVERSION;  Surgeon: Will Meredith [name], MD;  Location: [place];  Service: Cardiovascular;  Laterality: N/A;  . [place]   right eye  . CYSTOSCOPY  several times   kidney stones  . EP IMPLANTABLE DEVICE N/A 03/11/2015   Procedure: Pacemaker Implant;  Surgeon: Will Meredith [name], MD;  [place];  Laterality: Left  . LAMINECTOMY  1969  . RIGHT/LEFT HEART CATH AND CORONARY ANGIOGRAPHY N/A 08/20/2016   Procedure: Right/Left Heart Cath and Coronary Angiography;  Surgeon: Burnell Blanks,  MD;  Location: [place] CV LAB;  Service: Cardiovascular;  Laterality: N/A;  . TEE WITHOUT CARDIOVERSION N/A 07/22/2016   Procedure: TRANSESOPHAGEAL ECHOCARDIOGRAM (TEE);  Surgeon: Skeet Latch, MD;  Location: [place];  Service: Cardiovascular;  Laterality: N/A;  . TEE WITHOUT CARDIOVERSION N/A 08/31/2016   Procedure: TRANSESOPHAGEAL ECHOCARDIOGRAM (TEE);  Surgeon: Burnell Blanks, MD;  Location: [place];  Service: Open Heart Surgery;  Laterality: N/A;  . TEE WITHOUT CARDIOVERSION N/A 12/01/2016   Procedure: TRANSESOPHAGEAL ECHOCARDIOGRAM (TEE);  Surgeon: Larey Dresser, MD;  Location: [facility] ENDOSCOPY;  Service: Cardiovascular;  Laterality: N/A;  . TONSILLECTOMY  1964  . TRANSCATHETER AORTIC VALVE REPLACEMENT, TRANSFEMORAL N/A 08/31/2016   Procedure: TRANSCATHETER AORTIC VALVE REPLACEMENT, TRANSFEMORAL;  Surgeon: Burnell Blanks, MD;  Location: [place];  Service: Open Heart Surgery;  Laterality: N/A;  Social History   Socioeconomic History  . Marital status: Married    Spouse name: Not on file  . Number of children: 3  . Years of education: 34  . Highest education level: Not on file  Occupational History  . 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 Financial resource strain: Not on file  . Food insecurity:    Worry: Not on file    Inability: Not on file  . Transportation needs:    Medical: Not on file    Non-medical: Not on file  Tobacco Use  . Smoking status: Former Smoker    Packs/day: 2.00    Years: 45.00    Pack years: 90.00    Types: Cigarettes    Last attempt to quit: 01/20/1998    Years since quitting: 19.8  . Smokeless tobacco: Never Used  Substance and Sexual Activity  . Alcohol use: No    Alcohol/week: 0.0 standard drinks  . Drug use: No  . Sexual activity: Yes  Lifestyle  . Physical activity:    Days per week: Not on file    Minutes per session: Not on file  . Stress: Not on file  Relationships  . Social  connections:    Talks on phone: Not on file    Gets together: Not on file    Attends religious service: Not on file    Active member of club or organization: Not on file    Attends meetings of clubs or organizations: Not on file    Relationship status: Not on file  Other Topics Concern  . Not on file  Social History Narrative   Lives at home w/ his wife   Right-handed   Drinks 1 cup of coffee per day   Family History  Problem Relation Age of Onset  . Stomach cancer Mother   . Hypertension Father        Died of heart attack  . Heart attack Father   . Stroke Sister   . Heart disease Sister   . Cancer Brother   . Colon cancer Neg Hx       Review of Systems: Pertinent positive and negative review of systems were noted in the above HPI section. All other review of systems were otherwise negative.   Physical Exam: General: Well developed, well nourished, no acute distress Head: Normocephalic and atraumatic Eyes:  sclerae anicteric, EOMI Ears: Normal auditory acuity Mouth: No deformity or lesions Neck: Supple, no masses or thyromegaly Lungs: Clear throughout to auscultation Heart: Regular rate and rhythm; systolic murmur, no rubs or bruits Abdomen: Soft, non tender and non distended. No masses, hepatosplenomegaly or hernias noted. Normal Bowel sounds Rectal: Deferred to colonoscopy Musculoskeletal: Symmetrical with no gross deformities  Skin: No lesions on visible extremities Pulses:  Normal pulses noted Extremities: No clubbing, cyanosis, edema or deformities noted Neurological: Alert oriented x 4, grossly nonfocal Cervical Nodes:  No significant cervical adenopathy Inguinal Nodes: No significant inguinal adenopathy Psychological:  Alert and cooperative. Normal mood and affect  Assessment and Recommendations:   1. Abnormal CT of rectum, hematochezia, personal history of adenomatous colon polyps.  His large hemorrhoids are likely the abnormality noted on CT.  Schedule  colonoscopy. The risks (including bleeding, perforation, infection, missed lesions, medication reactions and possible hospitalization or surgery if complications occur), benefits, and alternatives to colonoscopy with possible biopsy and possible polypectomy were discussed with the patient and they consent to proceed.   2.  Atrial fibrillation  maintained on Coumadin.  Hold Coumadin 5 days before procedure - will instruct when and how to resume after procedure. Low but real risk of cardiovascular event such as heart attack, stroke, embolism, thrombosis or ischemia/infarct of other organs off Coumadin explained and need to seek urgent help if this occurs. The patient consents to proceed. Will communicate by phone or EMR with patient's prescribing provider to confirm that holding Coumadin is reasonable in this case.   3. Short segment Barrett's esophagus without dysplasia.  Surveillance EGD recommended in 09/2018.  Follow standard anti-reflex measures and continue omeprazole 20 mg daily.  4. ESRD on HD  5. S/P TAVR in 2018.  6. DM.   7. Legally blind.    cc: Alcide Evener, MD

## 2017-11-18 DIAGNOSIS — N3000 Acute cystitis without hematuria: Secondary | ICD-10-CM | POA: Diagnosis not present

## 2017-11-18 DIAGNOSIS — D631 Anemia in chronic kidney disease: Secondary | ICD-10-CM | POA: Diagnosis not present

## 2017-11-18 DIAGNOSIS — N186 End stage renal disease: Secondary | ICD-10-CM | POA: Diagnosis not present

## 2017-11-18 DIAGNOSIS — B961 Klebsiella pneumoniae [K. pneumoniae] as the cause of diseases classified elsewhere: Secondary | ICD-10-CM | POA: Diagnosis not present

## 2017-11-18 DIAGNOSIS — N2581 Secondary hyperparathyroidism of renal origin: Secondary | ICD-10-CM | POA: Diagnosis not present

## 2017-11-18 DIAGNOSIS — B955 Unspecified streptococcus as the cause of diseases classified elsewhere: Secondary | ICD-10-CM | POA: Diagnosis not present

## 2017-11-21 DIAGNOSIS — D631 Anemia in chronic kidney disease: Secondary | ICD-10-CM | POA: Diagnosis not present

## 2017-11-21 DIAGNOSIS — N186 End stage renal disease: Secondary | ICD-10-CM | POA: Diagnosis not present

## 2017-11-21 DIAGNOSIS — B955 Unspecified streptococcus as the cause of diseases classified elsewhere: Secondary | ICD-10-CM | POA: Diagnosis not present

## 2017-11-21 DIAGNOSIS — B961 Klebsiella pneumoniae [K. pneumoniae] as the cause of diseases classified elsewhere: Secondary | ICD-10-CM | POA: Diagnosis not present

## 2017-11-21 DIAGNOSIS — N2581 Secondary hyperparathyroidism of renal origin: Secondary | ICD-10-CM | POA: Diagnosis not present

## 2017-11-21 DIAGNOSIS — M961 Postlaminectomy syndrome, not elsewhere classified: Secondary | ICD-10-CM | POA: Diagnosis not present

## 2017-11-21 DIAGNOSIS — M47816 Spondylosis without myelopathy or radiculopathy, lumbar region: Secondary | ICD-10-CM | POA: Diagnosis not present

## 2017-11-22 DIAGNOSIS — N3021 Other chronic cystitis with hematuria: Secondary | ICD-10-CM | POA: Diagnosis not present

## 2017-11-22 DIAGNOSIS — D414 Neoplasm of uncertain behavior of bladder: Secondary | ICD-10-CM | POA: Diagnosis not present

## 2017-11-22 DIAGNOSIS — N3000 Acute cystitis without hematuria: Secondary | ICD-10-CM | POA: Diagnosis not present

## 2017-11-23 DIAGNOSIS — D631 Anemia in chronic kidney disease: Secondary | ICD-10-CM | POA: Diagnosis not present

## 2017-11-23 DIAGNOSIS — N186 End stage renal disease: Secondary | ICD-10-CM | POA: Diagnosis not present

## 2017-11-23 DIAGNOSIS — B955 Unspecified streptococcus as the cause of diseases classified elsewhere: Secondary | ICD-10-CM | POA: Diagnosis not present

## 2017-11-23 DIAGNOSIS — N2581 Secondary hyperparathyroidism of renal origin: Secondary | ICD-10-CM | POA: Diagnosis not present

## 2017-11-23 DIAGNOSIS — B961 Klebsiella pneumoniae [K. pneumoniae] as the cause of diseases classified elsewhere: Secondary | ICD-10-CM | POA: Diagnosis not present

## 2017-11-25 DIAGNOSIS — B961 Klebsiella pneumoniae [K. pneumoniae] as the cause of diseases classified elsewhere: Secondary | ICD-10-CM | POA: Diagnosis not present

## 2017-11-25 DIAGNOSIS — N186 End stage renal disease: Secondary | ICD-10-CM | POA: Diagnosis not present

## 2017-11-25 DIAGNOSIS — N2581 Secondary hyperparathyroidism of renal origin: Secondary | ICD-10-CM | POA: Diagnosis not present

## 2017-11-25 DIAGNOSIS — B955 Unspecified streptococcus as the cause of diseases classified elsewhere: Secondary | ICD-10-CM | POA: Diagnosis not present

## 2017-11-25 DIAGNOSIS — D631 Anemia in chronic kidney disease: Secondary | ICD-10-CM | POA: Diagnosis not present

## 2017-11-27 DIAGNOSIS — Z992 Dependence on renal dialysis: Secondary | ICD-10-CM | POA: Diagnosis not present

## 2017-11-27 DIAGNOSIS — N186 End stage renal disease: Secondary | ICD-10-CM | POA: Diagnosis not present

## 2017-11-27 DIAGNOSIS — E1129 Type 2 diabetes mellitus with other diabetic kidney complication: Secondary | ICD-10-CM | POA: Diagnosis not present

## 2017-11-28 DIAGNOSIS — B961 Klebsiella pneumoniae [K. pneumoniae] as the cause of diseases classified elsewhere: Secondary | ICD-10-CM | POA: Diagnosis not present

## 2017-11-28 DIAGNOSIS — N186 End stage renal disease: Secondary | ICD-10-CM | POA: Diagnosis not present

## 2017-11-28 DIAGNOSIS — D631 Anemia in chronic kidney disease: Secondary | ICD-10-CM | POA: Diagnosis not present

## 2017-11-28 DIAGNOSIS — N2581 Secondary hyperparathyroidism of renal origin: Secondary | ICD-10-CM | POA: Diagnosis not present

## 2017-11-29 DIAGNOSIS — R310 Gross hematuria: Secondary | ICD-10-CM | POA: Diagnosis not present

## 2017-11-29 DIAGNOSIS — N3021 Other chronic cystitis with hematuria: Secondary | ICD-10-CM | POA: Diagnosis not present

## 2017-11-29 DIAGNOSIS — D414 Neoplasm of uncertain behavior of bladder: Secondary | ICD-10-CM | POA: Diagnosis not present

## 2017-11-29 LAB — CUP PACEART REMOTE DEVICE CHECK
Brady Statistic RV Percent Paced: 61 %
Date Time Interrogation Session: 20190808012752
Implantable Lead Implant Date: 20161213
Implantable Lead Location: 753859
Implantable Lead Location: 753860
Lead Channel Pacing Threshold Amplitude: 1 V
Lead Channel Pacing Threshold Pulse Width: 0.5 ms
Lead Channel Sensing Intrinsic Amplitude: 9.3 mV
Lead Channel Setting Pacing Amplitude: 2.5 V
MDC IDC LEAD IMPLANT DT: 20161213
MDC IDC MSMT BATTERY REMAINING LONGEVITY: 122 mo
MDC IDC MSMT BATTERY REMAINING PERCENTAGE: 95.5 %
MDC IDC MSMT BATTERY VOLTAGE: 3.01 V
MDC IDC MSMT LEADCHNL RV IMPEDANCE VALUE: 430 Ohm
MDC IDC PG IMPLANT DT: 20161213
MDC IDC PG SERIAL: 7839215
MDC IDC SET LEADCHNL RV PACING PULSEWIDTH: 0.5 ms
MDC IDC SET LEADCHNL RV SENSING SENSITIVITY: 2 mV
Pulse Gen Model: 2240

## 2017-11-30 ENCOUNTER — Other Ambulatory Visit: Payer: Self-pay | Admitting: Cardiovascular Disease

## 2017-11-30 ENCOUNTER — Telehealth: Payer: Self-pay | Admitting: Cardiovascular Disease

## 2017-11-30 DIAGNOSIS — D631 Anemia in chronic kidney disease: Secondary | ICD-10-CM | POA: Diagnosis not present

## 2017-11-30 DIAGNOSIS — I482 Chronic atrial fibrillation: Secondary | ICD-10-CM | POA: Diagnosis not present

## 2017-11-30 DIAGNOSIS — B961 Klebsiella pneumoniae [K. pneumoniae] as the cause of diseases classified elsewhere: Secondary | ICD-10-CM | POA: Diagnosis not present

## 2017-11-30 DIAGNOSIS — N186 End stage renal disease: Secondary | ICD-10-CM | POA: Diagnosis not present

## 2017-11-30 DIAGNOSIS — N2581 Secondary hyperparathyroidism of renal origin: Secondary | ICD-10-CM | POA: Diagnosis not present

## 2017-11-30 NOTE — Telephone Encounter (Signed)
New message     [facility] Pre-operative Risk Assessment    Request for surgical clearance:  1. What type of surgery is being performed? Transurethral Resection of the Bladder Tumor   2. When is this surgery scheduled? TBD  3. What type of clearance is required (medical clearance vs. Pharmacy clearance to hold med vs. Both)?Both  4. Are there any medications that need to be held prior to surgery and how long?Coumadin, The Doctor will decide how long the coumadin is held.  5. Practice name and name of physician performing surgery? [facility], Dr. Jeffie Pollock  6. What is your office phone number [phone] ext [phone]   7.   What is your office fax number [phone]  8.   Anesthesia type (None, local, MAC, general) ? General    Alexander [name] 11/30/2017, 2:53 PM  _________________________________________________________________   (provider comments below)

## 2017-12-01 ENCOUNTER — Other Ambulatory Visit: Payer: Self-pay | Admitting: *Deleted

## 2017-12-01 DIAGNOSIS — Z950 Presence of cardiac pacemaker: Secondary | ICD-10-CM | POA: Diagnosis not present

## 2017-12-01 DIAGNOSIS — Z7952 Long term (current) use of systemic steroids: Secondary | ICD-10-CM | POA: Diagnosis not present

## 2017-12-01 DIAGNOSIS — Z8673 Personal history of transient ischemic attack (TIA), and cerebral infarction without residual deficits: Secondary | ICD-10-CM | POA: Diagnosis not present

## 2017-12-01 NOTE — Telephone Encounter (Signed)
Patient with diagnosis of atrial fibrillation on warfarin for anticoagulation.    Procedure: transurethral resection of bladder tumor Date of procedure: TBD  CHADS2-VASc score of  6 (, HTN, AGE, , stroke/tia x 2, CAD, AGE, )  CrCl 10.3 Platelet count 191  Per office protocol, patient can hold warfarin for 5 days prior to procedure.    Patient will need bridging with Lovenox (enoxaparin) around procedure.  Patient followed in [facility].  We will make arrangements for bridging as soon as date for procedure is set.

## 2017-12-01 NOTE — Patient Outreach (Signed)
[facility]) Care Management [place] Coordination  12/01/2017  ARGUS CARAHER [birth year] [id number]  08:40 am:  Received off-hours voice message requesting call-back from [name], caregiver/ daughter, on [facility] CM written consent, ofFred C Parco,76 y.o.male previously active [place] CM. Patient has history including, but not limited to, PAD, Aortic stenosis with TAVR 08/31/16, ESRD on HD (M, W, F), A-Fib on coumadin, DM, CVA in 2004, CHF, macular degeneration with legal blindness, and multiple falls.  Successful telephone outreach to patient's caregiver/ HIPAA- identity verified with caregiver.    Today, daughter/ caregiver updated me that patient has recently been diagnosed with bladder tumor, and that patient will be having surgery to remove tumor "soon;" [name] reports that surgery has not yet been scheduled, and that care is currently being coordinated by urology team for cardiac and renal clearance.    [name] voices concern that when she was contacted by one of the urology coordinators, she was told that patient would only stay in the hospital overnight and would have to attend outpatient HD the very next day; [name] is concerned around patient's safety, as patient has a history of multiple falls at home; dizziness/ syncope with HD; patient will receive general anesthesia with surgery and has history of AF; patient is legally blind; patient's wife has health issues and they continue to live independently.  [name] verbalizes concern for all of the above reasons about patient being discharged home from the hospital after only an overnight stay; verbalizes that she would like for patient to have HD at the hospital post- surgery, where he can be monitored and kept safe, hopefully without post-surgery complications.  [name] wanted my advice around her stated concerns.  I confirmed that caregiver has valid concerns, and that patient  safety is priority; I encouraged [name] to discuss all of her stated concerns with patient's surgeon prior to surgery and to request information on what the post-op discharge instructions/ expectations are, so that she and her family can begin preparing proactively to keep patient safe at home once he is discharged after his surgery.  [name] was appreciative of our phone call today and stated she would discuss her concerns with the patient's urology provider prior to patient having surgery.  Caregiver denies further needs today, and denies ongoing care coordination needs.  [name], RN, BSN, [employer] [facility] Care Management  [phone]

## 2017-12-01 NOTE — Telephone Encounter (Signed)
   Primary Cardiologist: Skeet Latch, MD  Chart reviewed as part of pre-operative protocol coverage. Patient was contacted 12/01/2017 in reference to pre-operative risk assessment for pending surgery as outlined below.  Alexander [name] was last seen on 11/15/17 by [name].  Since that day, Alexander [name] has done well. No change in cardiac symptoms.   Therefore, based on ACC/AHA guidelines, the patient would be at acceptable risk for the planned procedure without further cardiovascular testing.   Will route to pharmacy to review anticoagulation.   [place], [place] 12/01/2017, 1:56 PM

## 2017-12-02 ENCOUNTER — Ambulatory Visit (INDEPENDENT_AMBULATORY_CARE_PROVIDER_SITE_OTHER): Payer: Medicare Other | Admitting: Pharmacist Clinician (PhC)/ Clinical Pharmacy Specialist

## 2017-12-02 DIAGNOSIS — N2581 Secondary hyperparathyroidism of renal origin: Secondary | ICD-10-CM | POA: Diagnosis not present

## 2017-12-02 DIAGNOSIS — B961 Klebsiella pneumoniae [K. pneumoniae] as the cause of diseases classified elsewhere: Secondary | ICD-10-CM | POA: Diagnosis not present

## 2017-12-02 DIAGNOSIS — I4891 Unspecified atrial fibrillation: Secondary | ICD-10-CM

## 2017-12-02 DIAGNOSIS — N186 End stage renal disease: Secondary | ICD-10-CM | POA: Diagnosis not present

## 2017-12-02 DIAGNOSIS — D631 Anemia in chronic kidney disease: Secondary | ICD-10-CM | POA: Diagnosis not present

## 2017-12-02 DIAGNOSIS — Z7901 Long term (current) use of anticoagulants: Secondary | ICD-10-CM | POA: Diagnosis not present

## 2017-12-02 LAB — POCT INR: INR: 1.4 — AB (ref 2.0–3.0)

## 2017-12-02 NOTE — Patient Instructions (Addendum)
:   Last dose of Coumadin.   : No Coumadin or Lovenox.   : Inject Lovenox 80 mg in the fatty abdominal tissue at least 2 inches from the belly button once daily in the morning. No Coumadin.   : Inject Lovenox in the fatty tissue once daily in the morning. No Coumadin.   : Inject Lovenox in the fatty tissue once daily in the morning. No Coumadin.   : Inject Lovenox in the fatty tissue once daily in the morning. No Coumadin.   : Procedure Day - No Lovenox - Resume Coumadin ___ in the evening or as directed by doctor   : Resume Lovenox inject in the fatty tissue once daily in the morning and take Coumadin ___.   : Inject Lovenox in the fatty tissue once daily in the morning and take Coumadin ____.   : Inject Lovenox in the fatty tissue once daily in the morning and take Coumadin ____.   : Inject Lovenox in the fatty tissue once daily in the morning and take Coumadin ____.   : Inject Lovenox in the fatty tissue once daily in the morning and take Coumadin ____.   : Coumadin appt to check INR.

## 2017-12-02 NOTE — Telephone Encounter (Signed)
I will route this recommendation to the requesting party via Epic fax function and remove from pre-op pool.  Please call with questions.  [place], [place] 12/02/2017, 8:05 AM

## 2017-12-05 DIAGNOSIS — N186 End stage renal disease: Secondary | ICD-10-CM | POA: Diagnosis not present

## 2017-12-05 DIAGNOSIS — D631 Anemia in chronic kidney disease: Secondary | ICD-10-CM | POA: Diagnosis not present

## 2017-12-05 DIAGNOSIS — B961 Klebsiella pneumoniae [K. pneumoniae] as the cause of diseases classified elsewhere: Secondary | ICD-10-CM | POA: Diagnosis not present

## 2017-12-05 DIAGNOSIS — N2581 Secondary hyperparathyroidism of renal origin: Secondary | ICD-10-CM | POA: Diagnosis not present

## 2017-12-06 ENCOUNTER — Other Ambulatory Visit: Payer: Self-pay | Admitting: Urology

## 2017-12-06 ENCOUNTER — Other Ambulatory Visit: Payer: Self-pay | Admitting: Pharmacist

## 2017-12-06 MED ORDER — ENOXAPARIN SODIUM 80 MG/0.8ML ~~LOC~~ SOLN: 80.0000 mg | SUBCUTANEOUS | 0 refills | Status: DC

## 2017-12-06 NOTE — Telephone Encounter (Signed)
Patient's daughter called office. Urology procedure schedule for 12/13/17.   Plan emailed to daughter's secure email per patient's request   Bridging plan:  12/07/17: Last dose of Coumadin.  12/08/17 : No Coumadin or Lovenox.  12/09/17 : Inject Lovenox 80 mg in the fatty abdominal tissue at least 2 inches from the belly button once daily in the morning. No Coumadin.  12/10/17: Inject Lovenox in the fatty tissue once daily in the morning. No Coumadin.   12/11/17 : Inject Lovenox in the fatty tissue once daily in the morning. No Coumadin.   12/12/17: Inject Lovenox in the fatty tissue once daily in the morning. No Coumadin.   12/13/17: Procedure Day - No Lovenox - Resume Coumadin 5mg  in the evening or as directed by doctor   12/14/17 : Resume Lovenox inject in the fatty tissue once daily in the morning and take Coumadin 5mg .   12/15/17 : Inject Lovenox in the fatty tissue once daily in the morning and take Coumadin 5mg .   12/16/17: Coumadin appt to check INR at 1:30am

## 2017-12-07 DIAGNOSIS — N186 End stage renal disease: Secondary | ICD-10-CM | POA: Diagnosis not present

## 2017-12-07 DIAGNOSIS — D631 Anemia in chronic kidney disease: Secondary | ICD-10-CM | POA: Diagnosis not present

## 2017-12-07 DIAGNOSIS — N2581 Secondary hyperparathyroidism of renal origin: Secondary | ICD-10-CM | POA: Diagnosis not present

## 2017-12-07 DIAGNOSIS — B961 Klebsiella pneumoniae [K. pneumoniae] as the cause of diseases classified elsewhere: Secondary | ICD-10-CM | POA: Diagnosis not present

## 2017-12-07 NOTE — Progress Notes (Signed)
EKG-10/29/17-epic Clearance- Bhagat Bhavinkumar- 12/01/17-on chart  ECHO-10/30/2017-epic  Last Device Check- 11/29/2017-epic  CXR-1V- 10/28/17-epic

## 2017-12-07 NOTE — Patient Instructions (Addendum)
DRAYCEN LEICHTER  12/07/2017   Your procedure is scheduled on: 12/13/2017     Report to [facility] Main  Entrance    Report to Admitting at  0830 AM    Call this number if you have problems the morning of surgery [phone]     Remember: Do not eat food or drink liquids :After Midnight.     Take these medicines the morning of surgery with A SIP OF WATER:  Eye drops as usual, Zoloft,  Prilosec   DO NOT TAKE ANY DIABETIC MEDICATIONS DAY OF YOUR SURGERY                               You may not have any metal on your body including hair pins and              piercings  Do not wear jewelry, lotions, powders, cologne or  deodorant             Men may shave face and neck.   Do not bring valuables to the hospital. [place].  Contacts, dentures or bridgework may not be worn into surgery.  Leave suitcase in the car. After surgery it may be brought to your room.    Special Instructions: N/A              Please read over the following fact sheets you were given: _____________________________________________________________________             [facility] - Preparing for Surgery Before surgery, you can play an important role.  Because skin is not sterile, your skin needs to be as free of germs as possible.  You can reduce the number of germs on your skin by washing with CHG (chlorahexidine gluconate) soap before surgery.  CHG is an antiseptic cleaner which kills germs and bonds with the skin to continue killing germs even after washing. Please DO NOT use if you have an allergy to CHG or antibacterial soaps.  If your skin becomes reddened/irritated stop using the CHG and inform your nurse when you arrive at Short Stay. Do not shave (including legs and underarms) for at least 48 hours prior to the first CHG shower.  You may shave your face/neck. Please follow these instructions carefully:  1.  Shower with CHG Soap  the night before surgery and the  morning of Surgery.  2.  If you choose to wash your hair, wash your hair first as usual with your  normal  shampoo.  3.  After you shampoo, rinse your hair and body thoroughly to remove the  shampoo.                           4.  Use CHG as you would any other liquid soap.  You can apply chg directly  to the skin and wash                       Gently with a scrungie or clean washcloth.  5.  Apply the CHG Soap to your body ONLY FROM THE NECK DOWN.   Do not use on face/ open  Wound or open sores. Avoid contact with eyes, ears mouth and genitals (private parts).                       Wash face,  Genitals (private parts) with your normal soap.             6.  Wash thoroughly, paying special attention to the area where your surgery  will be performed.  7.  Thoroughly rinse your body with warm water from the neck down.  8.  DO NOT shower/wash with your normal soap after using and rinsing off  the CHG Soap.                9.  Pat yourself dry with a clean towel.            10.  Wear clean pajamas.            11.  Place clean sheets on your bed the night of your first shower and do not  sleep with pets. Day of Surgery : Do not apply any lotions/deodorants the morning of surgery.  Please wear clean clothes to the hospital/surgery center.  FAILURE TO FOLLOW THESE INSTRUCTIONS MAY RESULT IN THE CANCELLATION OF YOUR SURGERY PATIENT SIGNATURE_________________________________  NURSE SIGNATURE__________________________________  ________________________________________________________________________

## 2017-12-08 ENCOUNTER — Other Ambulatory Visit: Payer: Self-pay

## 2017-12-08 ENCOUNTER — Encounter (HOSPITAL_COMMUNITY)
Admission: RE | Admit: 2017-12-08 | Discharge: 2017-12-08 | Disposition: A | Discharge status: Home / Self Care | Payer: Medicare Other | Source: Ambulatory Visit | Attending: Urology | Admitting: Urology

## 2017-12-08 ENCOUNTER — Encounter (HOSPITAL_COMMUNITY): Payer: Self-pay

## 2017-12-08 DIAGNOSIS — I120 Hypertensive chronic kidney disease with stage 5 chronic kidney disease or end stage renal disease: Secondary | ICD-10-CM | POA: Insufficient documentation

## 2017-12-08 DIAGNOSIS — Z87891 Personal history of nicotine dependence: Secondary | ICD-10-CM | POA: Insufficient documentation

## 2017-12-08 DIAGNOSIS — I480 Paroxysmal atrial fibrillation: Secondary | ICD-10-CM | POA: Diagnosis not present

## 2017-12-08 DIAGNOSIS — N186 End stage renal disease: Secondary | ICD-10-CM | POA: Insufficient documentation

## 2017-12-08 DIAGNOSIS — B961 Klebsiella pneumoniae [K. pneumoniae] as the cause of diseases classified elsewhere: Secondary | ICD-10-CM | POA: Diagnosis not present

## 2017-12-08 DIAGNOSIS — N3289 Other specified disorders of bladder: Secondary | ICD-10-CM | POA: Insufficient documentation

## 2017-12-08 DIAGNOSIS — Z01818 Encounter for other preprocedural examination: Secondary | ICD-10-CM | POA: Diagnosis not present

## 2017-12-08 DIAGNOSIS — E785 Hyperlipidemia, unspecified: Secondary | ICD-10-CM | POA: Diagnosis not present

## 2017-12-08 DIAGNOSIS — R319 Hematuria, unspecified: Secondary | ICD-10-CM | POA: Diagnosis not present

## 2017-12-08 DIAGNOSIS — E1122 Type 2 diabetes mellitus with diabetic chronic kidney disease: Secondary | ICD-10-CM | POA: Insufficient documentation

## 2017-12-08 DIAGNOSIS — E1142 Type 2 diabetes mellitus with diabetic polyneuropathy: Secondary | ICD-10-CM | POA: Diagnosis not present

## 2017-12-08 DIAGNOSIS — Z8249 Family history of ischemic heart disease and other diseases of the circulatory system: Secondary | ICD-10-CM | POA: Insufficient documentation

## 2017-12-08 DIAGNOSIS — N2581 Secondary hyperparathyroidism of renal origin: Secondary | ICD-10-CM | POA: Diagnosis not present

## 2017-12-08 DIAGNOSIS — Z950 Presence of cardiac pacemaker: Secondary | ICD-10-CM | POA: Diagnosis not present

## 2017-12-08 DIAGNOSIS — D631 Anemia in chronic kidney disease: Secondary | ICD-10-CM | POA: Diagnosis not present

## 2017-12-08 LAB — HEMOGLOBIN A1C
Hgb A1c MFr Bld: 6.7 % — ABNORMAL HIGH (ref 4.8–5.6)
MEAN PLASMA GLUCOSE: 145.59 mg/dL

## 2017-12-08 LAB — GLUCOSE, CAPILLARY: GLUCOSE-CAPILLARY: 171 mg/dL — AB (ref 70–99)

## 2017-12-09 DIAGNOSIS — B961 Klebsiella pneumoniae [K. pneumoniae] as the cause of diseases classified elsewhere: Secondary | ICD-10-CM | POA: Diagnosis not present

## 2017-12-09 DIAGNOSIS — N2581 Secondary hyperparathyroidism of renal origin: Secondary | ICD-10-CM | POA: Diagnosis not present

## 2017-12-09 DIAGNOSIS — N186 End stage renal disease: Secondary | ICD-10-CM | POA: Diagnosis not present

## 2017-12-09 DIAGNOSIS — D631 Anemia in chronic kidney disease: Secondary | ICD-10-CM | POA: Diagnosis not present

## 2017-12-12 DIAGNOSIS — B961 Klebsiella pneumoniae [K. pneumoniae] as the cause of diseases classified elsewhere: Secondary | ICD-10-CM | POA: Diagnosis not present

## 2017-12-12 DIAGNOSIS — D631 Anemia in chronic kidney disease: Secondary | ICD-10-CM | POA: Diagnosis not present

## 2017-12-12 DIAGNOSIS — N186 End stage renal disease: Secondary | ICD-10-CM | POA: Diagnosis not present

## 2017-12-12 DIAGNOSIS — N2581 Secondary hyperparathyroidism of renal origin: Secondary | ICD-10-CM | POA: Diagnosis not present

## 2017-12-12 NOTE — H&P (Signed)
CC/HPI: I have blood in my urine.     Alexander [name] returns today in f/u. He was to have cystoscopy last week but his culture just came back today from 8/23 and grew Klebsiella. He continues to have gross hematuria and the recent CT showed a small enhancing lesion on the right bladder base that needs further evaluation. He has no other associated signs or symptoms. He has been placed on Fortaz by Dr. Lorrene Reid for 6 dose at dialysis. He continue to have gross hematuria.   GU Hx: Patient is a 77 year old male with past medical history of end-stage renal disease requiring dialysis for the further evaluation. He has been noticing hematuria as well as dysuria recently. He has been tried on 3 different antibiotics. On September 23, 2017, the patient grew enterococcus within his urine. He was sensitive to nitrofurantoin, penicillin, and vancomycin. His resistant to tetracycline and fluoroquinolones. The patient is allergic to penicillin. Patient has tried doxycycline, Cipro, and nitrofurantoin. No improvement on nitrofurantoin (currently on day 4 of 7). The patient's main complaints are dysuria and gross hematuria. This has been going on for approximately 4 weeks. He is currently on dialysis 3 times per week on Monday, Wednesday, and Friday. He voids approximately one cup of urine per day. He denies history of recurrent urinary tract infections. No fevers or flank pain. No difficulties voiding noticing frequent due to his end-stage renal disease. He does have a distant history of nephrolithiasis that once required endoscopy.    ALLERGIES: Atorvastatin - muscle aches Codeine - Nausea Penicillin - Hives Tramadol    MEDICATIONS: Coumadin 5 mg tablet  Zyrtec 10 mg capsule  Cardizem 120 mg tablet tablet  Cosopt  Digoxin 125 mcg tablet tablet  Gabapentin 100 mg capsule  Nephro-Vite  Parlodel 5 mg capsule  Pravastatin Sodium 10 mg tablet tablet  Prednisone 5 mg tablet  Prilosec Otc 20 mg tablet, delayed release   Renvela  Sensipar 30 mg tablet  Zoloft     GU PSH: None     PSH Notes: TAVR   NON-GU PSH: Back surgery    GU PMH: Acute Cystitis/UTI - 11/22/2017, - 10/12/2017 Bladder tumor/neoplasm, He has small enhancing lesion on the right bladder base on CT and I will cysto him next week. - 11/22/2017 Chronic cystitis (with hematuria), He has persistent hematuria and a postive culture with Klebsiella 2K colonies. I have sent the sent the sensitivities to Dr. Lorrene Reid and she will give him fortaz for 6 doses. - 11/22/2017      PMH Notes: dialysis x 7 years, pt says "NO VISION"   NON-GU PMH: Anxiety Arrhythmia Arthritis Depression Diabetes Type 2 GERD Glaucoma Stroke/TIA Unspecified macular degeneration    FAMILY HISTORY: 3 daughters - Daughter Death In The Family Father - Father Death In The Family Mother - Mother   SOCIAL HISTORY: Marital Status: Married Preferred Language: English; Ethnicity: Not Hispanic Or Latino; Race: White Current Smoking Status: Patient does not smoke anymore. Has not smoked since 09/27/1995.   Tobacco Use Assessment Completed: Used Tobacco in last 30 days? Has never drank.  Does not use drugs. Drinks 3 caffeinated drinks per day. Has not had a blood transfusion. Patient's occupation is/was retired.     Notes: 3 daughters   REVIEW OF SYSTEMS:    GU Review Male:   Patient reports burning/ pain with urination. Patient denies frequent urination, hard to postpone urination, get up at night to urinate, leakage of urine, stream starts and stops, trouble starting your  stream, have to strain to urinate , erection problems, and penile pain.  Gastrointestinal (Upper):   Patient denies nausea, vomiting, and indigestion/ heartburn.  Gastrointestinal (Lower):   Patient denies diarrhea and constipation.  Constitutional:   Patient denies fever, night sweats, weight loss, and fatigue.  Skin:   Patient denies skin rash/ lesion and itching.  Eyes:   Patient denies blurred  vision and double vision.  Ears/ Nose/ Throat:   Patient denies sore throat and sinus problems.  Hematologic/Lymphatic:   Patient reports easy bruising. Patient denies swollen glands.  Cardiovascular:   Patient denies leg swelling and chest pains.  Respiratory:   Patient denies cough and shortness of breath.  Endocrine:   Patient denies excessive thirst.  Musculoskeletal:   Patient reports joint pain. Patient denies back pain.  Neurological:   Patient reports dizziness. Patient denies headaches.  Psychologic:   Patient denies depression and anxiety.   Notes: dizziness while standing     VITAL SIGNS:      11/29/2017 12:12 PM  Weight 190 lb / 86.18 kg  Height 70.5 in / 179.07 cm  BP 97/52 mmHg  Heart Rate 64 /min  Temperature 98.0 F / 36.6 C  BMI 26.9 kg/m   PAST DATA REVIEWED:  Source Of History:  Patient  Urine Test Review:   Urinalysis   04/05/03  PSA  Total PSA 0.90     PROCEDURES:         Flexible Cystoscopy - 52000  Risks, benefits, and some of the potential complications of the procedure were discussed.    Meatus:  Normal size. Normal location. Normal condition.  Urethra:  No strictures.  External Sphincter:  Normal.  Verumontanum:  Normal.  Prostate:  Borderline obstructing. Moderate hyperplasia.  Bladder Neck:  Non-obstructing.  Ureteral Orifices:  not visualized due to bloody urine and poor bladder distensibility.  Bladder:  Mild trabeculation. A right lateral wall tumor that appears about 1.5cm and has a fleshy appearance. The bladder wall bleeds easily after distention and there is some more general erythema.       The procedure was well tolerated and there were no complications.         Urinalysis w/Scope Dipstick Dipstick Cont'd Micro  Color: Red Bilirubin: Neg mg/dL WBC/hpf: 20 - 40/hpf  Appearance: Cloudy Ketones: Trace mg/dL RBC/hpf: 40 - 60/hpf  Specific Gravity: 1.020 Blood: 3+ ery/uL Bacteria: Mod (26-50/hpf)  pH: 6.5 Protein: 3+ mg/dL Cystals:  NS (Not Seen)  Glucose: Neg mg/dL Urobilinogen: 0.2 mg/dL Casts: NS (Not Seen)    Nitrites: Neg Trichomonas: Not Present    Leukocyte Esterase: 3+ leu/uL Mucous: Not Present      Epithelial Cells: NS (Not Seen)      Yeast: NS (Not Seen)      Sperm: Not Present    Notes: UNSPUN MICROSCOPIC    ASSESSMENT:      ICD-10 Details  1 GU:   Gross hematuria - R31.0 The bleeding is improved but not resolved.   2   Bladder tumor/neoplasm - D41.4 He has a probably bladder cancer on the right wall and floor of the bladder. I am going to get him set up for cystoscopy with retrogrades and TURBT. I don't think he will need intravesical gemcitabine. I have reviewed the risks of bleeding, infection, bladder and ureteral injury, strictures, thrombotic events and anesthetic complications. He will need to be off coumadin and will probably need lovenox bridging, but I will have him cleared by [name] as well  as Dr. Lorrene Reid. He will be admitted overnight and I will engage the hospitalist service for medical management during the hospitalization.   3   Chronic cystitis (with hematuria) - N30.21 He is on [place] for the infection.      PLAN:           Schedule Return Visit/Planned Activity: Next Available Appointment - Schedule Surgery

## 2017-12-13 ENCOUNTER — Observation Stay (HOSPITAL_COMMUNITY)
Admission: RE | Admit: 2017-12-13 | Discharge: 2017-12-14 | Disposition: A | Discharge status: Home / Self Care | Payer: Medicare Other | Source: Ambulatory Visit | Attending: Urology | Admitting: Urology

## 2017-12-13 ENCOUNTER — Inpatient Hospital Stay (HOSPITAL_COMMUNITY): Payer: Medicare Other | Admitting: Anesthesiology

## 2017-12-13 ENCOUNTER — Encounter (HOSPITAL_COMMUNITY): Payer: Self-pay | Admitting: General Practice

## 2017-12-13 ENCOUNTER — Encounter (HOSPITAL_COMMUNITY)
Admission: RE | Disposition: A | Discharge status: Home / Self Care | Payer: Self-pay | Source: Ambulatory Visit | Attending: Family Medicine

## 2017-12-13 ENCOUNTER — Other Ambulatory Visit: Payer: Self-pay

## 2017-12-13 ENCOUNTER — Inpatient Hospital Stay (HOSPITAL_COMMUNITY): Payer: Medicare Other

## 2017-12-13 DIAGNOSIS — Z8673 Personal history of transient ischemic attack (TIA), and cerebral infarction without residual deficits: Secondary | ICD-10-CM | POA: Diagnosis not present

## 2017-12-13 DIAGNOSIS — R310 Gross hematuria: Secondary | ICD-10-CM | POA: Diagnosis not present

## 2017-12-13 DIAGNOSIS — T819XXA Unspecified complication of procedure, initial encounter: Secondary | ICD-10-CM | POA: Diagnosis present

## 2017-12-13 DIAGNOSIS — I482 Chronic atrial fibrillation: Secondary | ICD-10-CM | POA: Insufficient documentation

## 2017-12-13 DIAGNOSIS — C679 Malignant neoplasm of bladder, unspecified: Secondary | ICD-10-CM | POA: Diagnosis not present

## 2017-12-13 DIAGNOSIS — F419 Anxiety disorder, unspecified: Secondary | ICD-10-CM | POA: Diagnosis not present

## 2017-12-13 DIAGNOSIS — I499 Cardiac arrhythmia, unspecified: Secondary | ICD-10-CM | POA: Insufficient documentation

## 2017-12-13 DIAGNOSIS — N186 End stage renal disease: Secondary | ICD-10-CM | POA: Insufficient documentation

## 2017-12-13 DIAGNOSIS — C672 Malignant neoplasm of lateral wall of bladder: Secondary | ICD-10-CM | POA: Diagnosis not present

## 2017-12-13 DIAGNOSIS — E1122 Type 2 diabetes mellitus with diabetic chronic kidney disease: Secondary | ICD-10-CM | POA: Insufficient documentation

## 2017-12-13 DIAGNOSIS — I480 Paroxysmal atrial fibrillation: Secondary | ICD-10-CM | POA: Insufficient documentation

## 2017-12-13 DIAGNOSIS — I272 Pulmonary hypertension, unspecified: Secondary | ICD-10-CM | POA: Insufficient documentation

## 2017-12-13 DIAGNOSIS — Z7901 Long term (current) use of anticoagulants: Secondary | ICD-10-CM | POA: Insufficient documentation

## 2017-12-13 DIAGNOSIS — G5603 Carpal tunnel syndrome, bilateral upper limbs: Secondary | ICD-10-CM | POA: Insufficient documentation

## 2017-12-13 DIAGNOSIS — Z87442 Personal history of urinary calculi: Secondary | ICD-10-CM | POA: Insufficient documentation

## 2017-12-13 DIAGNOSIS — I350 Nonrheumatic aortic (valve) stenosis: Secondary | ICD-10-CM | POA: Diagnosis not present

## 2017-12-13 DIAGNOSIS — Z79899 Other long term (current) drug therapy: Secondary | ICD-10-CM | POA: Insufficient documentation

## 2017-12-13 DIAGNOSIS — H409 Unspecified glaucoma: Secondary | ICD-10-CM | POA: Insufficient documentation

## 2017-12-13 DIAGNOSIS — Z86010 Personal history of colonic polyps: Secondary | ICD-10-CM | POA: Diagnosis not present

## 2017-12-13 DIAGNOSIS — N2581 Secondary hyperparathyroidism of renal origin: Secondary | ICD-10-CM | POA: Diagnosis not present

## 2017-12-13 DIAGNOSIS — I120 Hypertensive chronic kidney disease with stage 5 chronic kidney disease or end stage renal disease: Secondary | ICD-10-CM | POA: Insufficient documentation

## 2017-12-13 DIAGNOSIS — E785 Hyperlipidemia, unspecified: Secondary | ICD-10-CM | POA: Insufficient documentation

## 2017-12-13 DIAGNOSIS — K921 Melena: Secondary | ICD-10-CM | POA: Insufficient documentation

## 2017-12-13 DIAGNOSIS — D696 Thrombocytopenia, unspecified: Secondary | ICD-10-CM | POA: Diagnosis not present

## 2017-12-13 DIAGNOSIS — C674 Malignant neoplasm of posterior wall of bladder: Secondary | ICD-10-CM | POA: Diagnosis not present

## 2017-12-13 DIAGNOSIS — Z7952 Long term (current) use of systemic steroids: Secondary | ICD-10-CM | POA: Insufficient documentation

## 2017-12-13 DIAGNOSIS — Z952 Presence of prosthetic heart valve: Secondary | ICD-10-CM | POA: Insufficient documentation

## 2017-12-13 DIAGNOSIS — R5383 Other fatigue: Secondary | ICD-10-CM | POA: Insufficient documentation

## 2017-12-13 DIAGNOSIS — D631 Anemia in chronic kidney disease: Secondary | ICD-10-CM | POA: Diagnosis not present

## 2017-12-13 DIAGNOSIS — R7881 Bacteremia: Secondary | ICD-10-CM | POA: Insufficient documentation

## 2017-12-13 DIAGNOSIS — Z885 Allergy status to narcotic agent status: Secondary | ICD-10-CM | POA: Insufficient documentation

## 2017-12-13 DIAGNOSIS — E1142 Type 2 diabetes mellitus with diabetic polyneuropathy: Secondary | ICD-10-CM | POA: Insufficient documentation

## 2017-12-13 DIAGNOSIS — Z8249 Family history of ischemic heart disease and other diseases of the circulatory system: Secondary | ICD-10-CM | POA: Insufficient documentation

## 2017-12-13 DIAGNOSIS — R896 Abnormal cytological findings in specimens from other organs, systems and tissues: Secondary | ICD-10-CM | POA: Diagnosis not present

## 2017-12-13 DIAGNOSIS — R0600 Dyspnea, unspecified: Secondary | ICD-10-CM | POA: Insufficient documentation

## 2017-12-13 DIAGNOSIS — C671 Malignant neoplasm of dome of bladder: Secondary | ICD-10-CM | POA: Diagnosis not present

## 2017-12-13 DIAGNOSIS — H548 Legal blindness, as defined in USA: Secondary | ICD-10-CM | POA: Insufficient documentation

## 2017-12-13 DIAGNOSIS — Z992 Dependence on renal dialysis: Secondary | ICD-10-CM | POA: Diagnosis not present

## 2017-12-13 DIAGNOSIS — K219 Gastro-esophageal reflux disease without esophagitis: Secondary | ICD-10-CM | POA: Insufficient documentation

## 2017-12-13 DIAGNOSIS — Z880 Allergy status to penicillin: Secondary | ICD-10-CM | POA: Insufficient documentation

## 2017-12-13 DIAGNOSIS — Z950 Presence of cardiac pacemaker: Secondary | ICD-10-CM | POA: Diagnosis not present

## 2017-12-13 DIAGNOSIS — F329 Major depressive disorder, single episode, unspecified: Secondary | ICD-10-CM | POA: Diagnosis not present

## 2017-12-13 DIAGNOSIS — C678 Malignant neoplasm of overlapping sites of bladder: Principal | ICD-10-CM

## 2017-12-13 DIAGNOSIS — N390 Urinary tract infection, site not specified: Secondary | ICD-10-CM | POA: Diagnosis present

## 2017-12-13 DIAGNOSIS — I959 Hypotension, unspecified: Secondary | ICD-10-CM | POA: Insufficient documentation

## 2017-12-13 DIAGNOSIS — J449 Chronic obstructive pulmonary disease, unspecified: Secondary | ICD-10-CM | POA: Insufficient documentation

## 2017-12-13 DIAGNOSIS — Z87891 Personal history of nicotine dependence: Secondary | ICD-10-CM | POA: Insufficient documentation

## 2017-12-13 DIAGNOSIS — Z888 Allergy status to other drugs, medicaments and biological substances status: Secondary | ICD-10-CM | POA: Insufficient documentation

## 2017-12-13 DIAGNOSIS — H3530 Unspecified macular degeneration: Secondary | ICD-10-CM | POA: Diagnosis not present

## 2017-12-13 DIAGNOSIS — I951 Orthostatic hypotension: Secondary | ICD-10-CM | POA: Insufficient documentation

## 2017-12-13 DIAGNOSIS — Z8711 Personal history of peptic ulcer disease: Secondary | ICD-10-CM | POA: Insufficient documentation

## 2017-12-13 DIAGNOSIS — N3021 Other chronic cystitis with hematuria: Secondary | ICD-10-CM | POA: Insufficient documentation

## 2017-12-13 HISTORY — PX: TRANSURETHRAL RESECTION OF BLADDER TUMOR: SHX2575

## 2017-12-13 HISTORY — PX: CYSTOSCOPY W/ RETROGRADES: SHX1426

## 2017-12-13 LAB — BASIC METABOLIC PANEL
Anion gap: 17 — ABNORMAL HIGH (ref 5–15)
BUN: 32 mg/dL — AB (ref 8–23)
CHLORIDE: 97 mmol/L — AB (ref 98–111)
CO2: 30 mmol/L (ref 22–32)
CREATININE: 6.6 mg/dL — AB (ref 0.61–1.24)
Calcium: 9.3 mg/dL (ref 8.9–10.3)
GFR calc Af Amer: 8 mL/min — ABNORMAL LOW (ref >60)
GFR calc non Af Amer: 7 mL/min — ABNORMAL LOW (ref >60)
Glucose, Bld: 156 mg/dL — ABNORMAL HIGH (ref 70–99)
Potassium: 3.9 mmol/L (ref 3.5–5.1)
Sodium: 144 mmol/L (ref 135–145)

## 2017-12-13 LAB — GLUCOSE, CAPILLARY: GLUCOSE-CAPILLARY: 138 mg/dL — AB (ref 70–99)

## 2017-12-13 LAB — CBC
HCT: 34.5 % — ABNORMAL LOW (ref 39.0–52.0)
Hemoglobin: 10.7 g/dL — ABNORMAL LOW (ref 13.0–17.0)
MCH: 32.4 pg (ref 26.0–34.0)
MCHC: 31 g/dL (ref 30.0–36.0)
MCV: 104.5 fL — AB (ref 78.0–100.0)
PLATELETS: 162 10*3/uL (ref 150–400)
RBC: 3.3 MIL/uL — ABNORMAL LOW (ref 4.22–5.81)
RDW: 20.2 % — AB (ref 11.5–15.5)
WBC: 8.1 10*3/uL (ref 4.0–10.5)

## 2017-12-13 LAB — PROTIME-INR
INR: 1
Prothrombin Time: 13.1 seconds (ref 11.4–15.2)

## 2017-12-13 SURGERY — TURBT (TRANSURETHRAL RESECTION OF BLADDER TUMOR)
Anesthesia: General

## 2017-12-13 MED ORDER — SODIUM CHLORIDE 0.9 % IR SOLN
Status: DC | PRN
  Administered 2017-12-13: 21000 mL via INTRAVESICAL

## 2017-12-13 MED ORDER — ONDANSETRON HCL 4 MG/2ML IJ SOLN
INTRAMUSCULAR | Status: AC
  Filled 2017-12-13: qty 2

## 2017-12-13 MED ORDER — DEXAMETHASONE SODIUM PHOSPHATE 10 MG/ML IJ SOLN
INTRAMUSCULAR | Status: AC
  Filled 2017-12-13: qty 1

## 2017-12-13 MED ORDER — GABAPENTIN 100 MG PO CAPS
200.0000 mg | ORAL_CAPSULE | Freq: Every day | ORAL | Status: DC
  Administered 2017-12-13: 200 mg via ORAL
  Filled 2017-12-13: qty 2

## 2017-12-13 MED ORDER — INFLUENZA VAC SPLIT HIGH-DOSE 0.5 ML IM SUSY
0.5000 mL | PREFILLED_SYRINGE | INTRAMUSCULAR | Status: DC
  Filled 2017-12-13: qty 0.5

## 2017-12-13 MED ORDER — GLYCOPYRROLATE 0.2 MG/ML IJ SOLN
INTRAMUSCULAR | Status: DC | PRN
  Administered 2017-12-13: 0.6 mg via INTRAVENOUS

## 2017-12-13 MED ORDER — CHLORHEXIDINE GLUCONATE CLOTH 2 % EX PADS
6.0000 | MEDICATED_PAD | Freq: Every day | CUTANEOUS | Status: DC
  Administered 2017-12-14: 6 via TOPICAL

## 2017-12-13 MED ORDER — PHENYLEPHRINE 40 MCG/ML (10ML) SYRINGE FOR IV PUSH (FOR BLOOD PRESSURE SUPPORT)
PREFILLED_SYRINGE | INTRAVENOUS | Status: DC | PRN
  Administered 2017-12-13: 40 ug via INTRAVENOUS
  Administered 2017-12-13: 80 ug via INTRAVENOUS

## 2017-12-13 MED ORDER — SERTRALINE HCL 50 MG PO TABS
50.0000 mg | ORAL_TABLET | Freq: Every day | ORAL | Status: DC
  Administered 2017-12-14: 50 mg via ORAL
  Filled 2017-12-13: qty 1

## 2017-12-13 MED ORDER — ACETAMINOPHEN 325 MG PO TABS
650.0000 mg | ORAL_TABLET | ORAL | Status: DC | PRN
  Filled 2017-12-13: qty 2

## 2017-12-13 MED ORDER — BISACODYL 10 MG RE SUPP: 10.0000 mg | Freq: Every day | RECTAL | Status: DC | PRN

## 2017-12-13 MED ORDER — NEOSTIGMINE METHYLSULFATE 10 MG/10ML IV SOLN
INTRAVENOUS | Status: DC | PRN
  Administered 2017-12-13: 4 mg via INTRAVENOUS

## 2017-12-13 MED ORDER — DILTIAZEM HCL ER COATED BEADS 120 MG PO CP24
120.0000 mg | ORAL_CAPSULE | Freq: Every day | ORAL | Status: DC
  Administered 2017-12-13 – 2017-12-14 (×2): 120 mg via ORAL
  Filled 2017-12-13 (×2): qty 1

## 2017-12-13 MED ORDER — SODIUM CHLORIDE 0.9 % IV SOLN
INTRAVENOUS | Status: DC
  Administered 2017-12-13: 14:00:00 via INTRAVENOUS

## 2017-12-13 MED ORDER — ONDANSETRON HCL 4 MG/2ML IJ SOLN: 4.0000 mg | INTRAMUSCULAR | Status: DC | PRN

## 2017-12-13 MED ORDER — DIGOXIN 0.0625 MG HALF TABLET
0.0625 mg | ORAL_TABLET | Freq: Every day | ORAL | Status: DC
  Administered 2017-12-13: 0.0625 mg via ORAL
  Filled 2017-12-13 (×2): qty 1
  Filled 2017-12-13: qty 0.5

## 2017-12-13 MED ORDER — ESMOLOL HCL 100 MG/10ML IV SOLN
INTRAVENOUS | Status: DC | PRN
  Administered 2017-12-13: 20 mg via INTRAVENOUS

## 2017-12-13 MED ORDER — FENTANYL CITRATE (PF) 100 MCG/2ML IJ SOLN
INTRAMUSCULAR | Status: AC
  Filled 2017-12-13: qty 2

## 2017-12-13 MED ORDER — PANTOPRAZOLE SODIUM 40 MG PO TBEC
40.0000 mg | DELAYED_RELEASE_TABLET | Freq: Every day | ORAL | Status: DC
  Administered 2017-12-14: 40 mg via ORAL
  Filled 2017-12-13: qty 1

## 2017-12-13 MED ORDER — LIDOCAINE 2% (20 MG/ML) 5 ML SYRINGE
INTRAMUSCULAR | Status: DC | PRN
  Administered 2017-12-13: 80 mg via INTRAVENOUS

## 2017-12-13 MED ORDER — HYDROMORPHONE HCL 1 MG/ML IJ SOLN: 0.2000 mg | INTRAMUSCULAR | Status: DC | PRN

## 2017-12-13 MED ORDER — OXYCODONE HCL 5 MG/5ML PO SOLN
5.0000 mg | Freq: Once | ORAL | Status: DC | PRN
  Filled 2017-12-13: qty 5

## 2017-12-13 MED ORDER — FENTANYL CITRATE (PF) 100 MCG/2ML IJ SOLN
INTRAMUSCULAR | Status: DC | PRN
  Administered 2017-12-13 (×2): 25 ug via INTRAVENOUS

## 2017-12-13 MED ORDER — HYDROMORPHONE HCL 2 MG PO TABS: 2.0000 mg | ORAL_TABLET | Freq: Four times a day (QID) | ORAL | Status: DC | PRN

## 2017-12-13 MED ORDER — PROPOFOL 10 MG/ML IV BOLUS
INTRAVENOUS | Status: DC | PRN
  Administered 2017-12-13: 50 mg via INTRAVENOUS
  Administered 2017-12-13: 120 mg via INTRAVENOUS

## 2017-12-13 MED ORDER — NEPRO PO LIQD: 237.0000 mL | ORAL | Status: DC

## 2017-12-13 MED ORDER — CEFTAZIDIME 1 G IJ SOLR
500.0000 mg | Freq: Once | INTRAMUSCULAR | Status: AC
  Administered 2017-12-13: 500 mg via INTRAVENOUS
  Filled 2017-12-13: qty 0.5

## 2017-12-13 MED ORDER — SUGAMMADEX SODIUM 200 MG/2ML IV SOLN
INTRAVENOUS | Status: AC
  Filled 2017-12-13: qty 2

## 2017-12-13 MED ORDER — RENA-VITE PO TABS
1.0000 | ORAL_TABLET | Freq: Every day | ORAL | Status: DC
  Administered 2017-12-13: 1 via ORAL
  Filled 2017-12-13 (×2): qty 1

## 2017-12-13 MED ORDER — CINACALCET HCL 30 MG PO TABS
60.0000 mg | ORAL_TABLET | Freq: Every day | ORAL | Status: DC
  Administered 2017-12-13: 60 mg via ORAL
  Filled 2017-12-13 (×2): qty 2

## 2017-12-13 MED ORDER — PROMETHAZINE HCL 25 MG/ML IJ SOLN: 6.2500 mg | INTRAMUSCULAR | Status: DC | PRN

## 2017-12-13 MED ORDER — BROMOCRIPTINE MESYLATE 2.5 MG PO TABS
5.0000 mg | ORAL_TABLET | ORAL | Status: DC
  Administered 2017-12-14: 5 mg via ORAL
  Filled 2017-12-13: qty 2

## 2017-12-13 MED ORDER — PREDNISONE 5 MG PO TABS
5.0000 mg | ORAL_TABLET | Freq: Every day | ORAL | Status: DC
  Administered 2017-12-13: 5 mg via ORAL
  Filled 2017-12-13: qty 1

## 2017-12-13 MED ORDER — ACETAMINOPHEN ER 650 MG PO TBCR: 650.0000 mg | EXTENDED_RELEASE_TABLET | Freq: Three times a day (TID) | ORAL | Status: DC | PRN

## 2017-12-13 MED ORDER — NEPRO/CARBSTEADY PO LIQD
237.0000 mL | ORAL | Status: DC
  Administered 2017-12-14: 237 mL via ORAL
  Filled 2017-12-13: qty 237

## 2017-12-13 MED ORDER — LORATADINE 10 MG PO TABS
10.0000 mg | ORAL_TABLET | Freq: Every day | ORAL | Status: DC
  Administered 2017-12-14: 10 mg via ORAL
  Filled 2017-12-13: qty 1

## 2017-12-13 MED ORDER — ONDANSETRON HCL 4 MG/2ML IJ SOLN
INTRAMUSCULAR | Status: DC | PRN
  Administered 2017-12-13: 4 mg via INTRAVENOUS

## 2017-12-13 MED ORDER — HYDROMORPHONE HCL 1 MG/ML IJ SOLN: 0.2500 mg | INTRAMUSCULAR | Status: DC | PRN

## 2017-12-13 MED ORDER — SENNOSIDES-DOCUSATE SODIUM 8.6-50 MG PO TABS: 1.0000 | ORAL_TABLET | Freq: Every evening | ORAL | Status: DC | PRN

## 2017-12-13 MED ORDER — PRAVASTATIN SODIUM 20 MG PO TABS
10.0000 mg | ORAL_TABLET | Freq: Every day | ORAL | Status: DC
  Administered 2017-12-14: 10 mg via ORAL
  Filled 2017-12-13: qty 1

## 2017-12-13 MED ORDER — 0.9 % SODIUM CHLORIDE (POUR BTL) OPTIME
TOPICAL | Status: DC | PRN
  Administered 2017-12-13: 1000 mL

## 2017-12-13 MED ORDER — SODIUM CHLORIDE 0.9 % IV SOLN
INTRAVENOUS | Status: DC | PRN
  Administered 2017-12-13: 20 mL

## 2017-12-13 MED ORDER — CISATRACURIUM BESYLATE (PF) 10 MG/5ML IV SOLN
INTRAVENOUS | Status: DC | PRN
  Administered 2017-12-13: 2 mg via INTRAVENOUS
  Administered 2017-12-13: 10 mg via INTRAVENOUS
  Administered 2017-12-13: 2 mg via INTRAVENOUS

## 2017-12-13 MED ORDER — DEXAMETHASONE SODIUM PHOSPHATE 10 MG/ML IJ SOLN
INTRAMUSCULAR | Status: DC | PRN
  Administered 2017-12-13: 10 mg via INTRAVENOUS

## 2017-12-13 MED ORDER — SEVELAMER CARBONATE 800 MG PO TABS
2400.0000 mg | ORAL_TABLET | Freq: Two times a day (BID) | ORAL | Status: DC
  Administered 2017-12-13 – 2017-12-14 (×2): 2400 mg via ORAL
  Filled 2017-12-13 (×4): qty 3

## 2017-12-13 MED ORDER — DORZOLAMIDE HCL-TIMOLOL MAL 2-0.5 % OP SOLN
1.0000 [drp] | Freq: Two times a day (BID) | OPHTHALMIC | Status: DC
  Administered 2017-12-13 – 2017-12-14 (×2): 1 [drp] via OPHTHALMIC
  Filled 2017-12-13: qty 10

## 2017-12-13 MED ORDER — OXYCODONE HCL 5 MG PO TABS: 5.0000 mg | ORAL_TABLET | Freq: Once | ORAL | Status: DC | PRN

## 2017-12-13 MED ORDER — SODIUM CHLORIDE 0.9 % IV SOLN
INTRAVENOUS | Status: DC
  Administered 2017-12-13: 09:00:00 via INTRAVENOUS

## 2017-12-13 MED ORDER — PROPOFOL 10 MG/ML IV BOLUS
INTRAVENOUS | Status: AC
  Filled 2017-12-13: qty 20

## 2017-12-13 MED ORDER — SODIUM CHLORIDE 0.9 % IV SOLN
INTRAVENOUS | Status: DC | PRN
  Administered 2017-12-13: 30 ug/min via INTRAVENOUS

## 2017-12-13 MED ORDER — CISATRACURIUM BESYLATE 20 MG/10ML IV SOLN
INTRAVENOUS | Status: AC
  Filled 2017-12-13: qty 10

## 2017-12-13 SURGICAL SUPPLY — 30 items
BAG URINE DRAINAGE (UROLOGICAL SUPPLIES) ×4 IMPLANT
BAG URO CATCHER STRL LF (MISCELLANEOUS) ×4 IMPLANT
BASKET STONE NCOMPASS (UROLOGICAL SUPPLIES) IMPLANT
CATH FOLEY 2WAY SLVR  5CC 20FR (CATHETERS) ×2
CATH FOLEY 2WAY SLVR 5CC 20FR (CATHETERS) ×2 IMPLANT
CATH FOLEY 3WAY 30CC 22FR (CATHETERS) IMPLANT
CATH URET 5FR 28IN OPEN ENDED (CATHETERS) ×4 IMPLANT
CATH URET DUAL LUMEN 6-10FR 50 (CATHETERS) ×4 IMPLANT
CLOTH BEACON ORANGE TIMEOUT ST (SAFETY) ×4 IMPLANT
CONT SPEC 4OZ CLIKSEAL STRL BL (MISCELLANEOUS) ×4 IMPLANT
EXTRACTOR STONE NITINOL NGAGE (UROLOGICAL SUPPLIES) ×4 IMPLANT
FIBER LASER FLEXIVA 1000 (UROLOGICAL SUPPLIES) IMPLANT
FIBER LASER FLEXIVA 365 (UROLOGICAL SUPPLIES) IMPLANT
FIBER LASER FLEXIVA 550 (UROLOGICAL SUPPLIES) IMPLANT
FIBER LASER TRAC TIP (UROLOGICAL SUPPLIES) IMPLANT
GLOVE SURG SS PI 8.0 STRL IVOR (GLOVE) IMPLANT
GOWN STRL REUS W/TWL XL LVL3 (GOWN DISPOSABLE) ×4 IMPLANT
GUIDEWIRE STR DUAL SENSOR (WIRE) ×4 IMPLANT
HOLDER FOLEY CATH W/STRAP (MISCELLANEOUS) ×4 IMPLANT
LOOP CUT BIPOLAR 24F LRG (ELECTROSURGICAL) ×4 IMPLANT
MANIFOLD NEPTUNE II (INSTRUMENTS) ×4 IMPLANT
PACK CYSTO (CUSTOM PROCEDURE TRAY) ×4 IMPLANT
SET ASPIRATION TUBING (TUBING) ×4 IMPLANT
SHEATH URETERAL 12FRX35CM (MISCELLANEOUS) ×4 IMPLANT
SUT ETHILON 3 0 PS 1 (SUTURE) IMPLANT
SYR 30ML LL (SYRINGE) IMPLANT
SYRINGE IRR TOOMEY STRL 70CC (SYRINGE) IMPLANT
TUBING CONNECTING 10 (TUBING) ×3 IMPLANT
TUBING CONNECTING 10' (TUBING) ×1
TUBING UROLOGY SET (TUBING) ×4 IMPLANT

## 2017-12-13 NOTE — Interval H&P Note (Signed)
History and Physical Interval Note:  12/13/2017 10:07 AM  Alexander [name]  has presented today for surgery, with the diagnosis of BLADDER MASS WITH HEMATURIA  The various methods of treatment have been discussed with the patient and family. After consideration of risks, benefits and other options for treatment, the patient has consented to  Procedure(s): TRANSURETHRAL RESECTION OF BLADDER TUMOR (TURBT) (N/A) CYSTOSCOPY WITH RETROGRADE PYELOGRAM (Bilateral) as a surgical intervention .  The patient's history has been reviewed, patient examined, no change in status, stable for surgery.  I have reviewed the patient's chart and labs.  Questions were answered to the patient's satisfaction.     Irine Seal

## 2017-12-13 NOTE — Consult Note (Addendum)
CONSULT-Urology is Alexander [name]:[id number] DOB: 16-May-1940 DOA: 12/13/2017  PCP: Deland Pretty, MD   Chief Complaint: Postop complicated patient with general medical needs  HPI:  77 year old male-June 2018 TAVR-ESRD MWF,-prior stroke-permanent A. fib + PPM-St Jude dual-chamber PPM 03/11/2015, DM + -enterococcal infection-emphysema former smoker 1999, carotid artery occlusion left, legal blindness secondary to macular degeneration, pseudogout He has been followed in the past by infectious disease for drug-resistant Enterococcus faecalis Low-level hematochezia followed by Dr. Fuller Plan of [place] On Midrin Monday Wednesday Friday with dialysis mention to take an additional Midrin at dialysis if blood pressure drops Comes in for elective procedure-I actually had seen this patient in discharge from 05/14/2017-he was admitted at that time for syncope-  Comes in for routine Rx and TURBT under the care of Dr. Roni Bread urology-surgery seem to go as planned patient was seen in the PACU by me and is still a little sleepy He is able to tell me he is at [place] but is still sleepy from anesthetic Family is available to give history    Review of Systems:   Unable to completely obtain will attempt later   Past Medical History:  Diagnosis Date  . Anemia   . Aortic stenosis 06/15/12   TEE - EF 31-54%; grade 1 diastolic dysfunction; mild/mod aortic valve stenosis; Mitral valve had calcified annulus, mild pulm htn PA peak pressure 65mmHg  . Barrett's esophagus 05/2003  . Bradycardia 2017   St. Jude [employer] 2240 Assurity dual-lead pacemaker  . Carpal tunnel syndrome, bilateral 11/03/2015  . Colon polyps   . CVA (cerebral infarction)    2004/affected left side  . Depression   . Diabetes mellitus without complication ([place])   . Diabetic peripheral neuropathy ([place]) 10/02/2015  . Diverticulosis   . Dyspnea    with exertion  . ESRD (end stage renal disease) on dialysis ([place])    "[facility]; [place];  MWF" (05/12/2017)  . Failure to thrive syndrome, adult 10/30/2017  . GERD (gastroesophageal reflux disease)   . Glaucoma   . History of kidney stones   . Hyperlipidemia   . Hypertension   . Legally blind   . Macular degeneration    both eyes  . Orthostatic hypotension 09/09/2015  . Paroxysmal atrial fibrillation (HCC)   . Peptic ulcer    bleeding, 1969  . Presence of permanent cardiac pacemaker   . S/P epidural steroid injection    last  injection over 10 years ago  . Seasonal allergies   . Tubular adenoma of colon 07/2001    Past Surgical History:  Procedure Laterality Date  . AV FISTULA PLACEMENT  2009  . BACK SURGERY    . CARDIOVERSION N/A 11/13/2015   Procedure: CARDIOVERSION;  Surgeon: Troy Sine, MD;  Location[facility] ENDOSCOPY;  Service: Cardiovascular;  Laterality: N/A;  . CARDIOVERSION N/A 01/13/2016   Procedure: CARDIOVERSION;  Surgeon: Will Meredith [name], MD;  Location: [place];  Service: Cardiovascular;  Laterality: N/A;  . [place]   right eye  . CYSTOSCOPY  several times   kidney stones  . EP IMPLANTABLE DEVICE N/A 03/11/2015   Procedure: Pacemaker Implant;  Surgeon: Will Meredith [name], MD;  [place];  Laterality: Left  . LAMINECTOMY  1969  . RIGHT/LEFT HEART CATH AND CORONARY ANGIOGRAPHY N/A 08/20/2016   Procedure: Right/Left Heart Cath and Coronary Angiography;  Surgeon: Burnell Blanks, MD;  Location: [place] CV LAB;  Service: Cardiovascular;  Laterality: N/A;  .  TEE WITHOUT CARDIOVERSION N/A 07/22/2016   Procedure: TRANSESOPHAGEAL ECHOCARDIOGRAM (TEE);  Surgeon: Skeet Latch, MD;  Location: [place];  Service: Cardiovascular;  Laterality: N/A;  . TEE WITHOUT CARDIOVERSION N/A 08/31/2016   Procedure: TRANSESOPHAGEAL ECHOCARDIOGRAM (TEE);  Surgeon: Burnell Blanks, MD;  Location: [place];  Service: Open Heart Surgery;  Laterality: N/A;  . TEE WITHOUT CARDIOVERSION N/A 12/01/2016   Procedure:  TRANSESOPHAGEAL ECHOCARDIOGRAM (TEE);  Surgeon: Larey Dresser, MD;  Location: [facility] ENDOSCOPY;  Service: Cardiovascular;  Laterality: N/A;  . TONSILLECTOMY  1964  . TRANSCATHETER AORTIC VALVE REPLACEMENT, TRANSFEMORAL N/A 08/31/2016   Procedure: TRANSCATHETER AORTIC VALVE REPLACEMENT, TRANSFEMORAL;  Surgeon: Burnell Blanks, MD;  Location: [place];  Service: Open Heart Surgery;  Laterality: N/A;     reports that he quit smoking about 19 years ago. His smoking use included cigarettes. He has a 90.00 pack-year smoking history. He has never used smokeless tobacco. He reports that he does not drink alcohol or use drugs. Mobility: At baseline is independent Goes to do his dialysis Monday Wednesday Friday  Allergies  Allergen Reactions  . Penicillins Swelling and Rash    Has patient had a PCN reaction causing immediate rash, facial/tongue/throat swelling, SOB or lightheadedness with hypotension: Yes Has patient had a PCN reaction causing severe rash involving mucus membranes or skin necrosis: No Has patient had a PCN reaction that required hospitalization: No Has patient had a PCN reaction occurring within the last 10 years: No If all of the above answers are "NO", then may proceed with Cephalosporin use.   . Atorvastatin     MYALGIA   . Codeine Nausea Only  . Tramadol Nausea Only    Family History  Problem Relation Age of Onset  . Stomach cancer Mother   . Hypertension Father        Died of heart attack  . Heart attack Father   . Stroke Sister   . Heart disease Sister   . Cancer Brother   . Colon cancer Neg Hx      Prior to Admission medications   Medication Sig Start Date End Date Taking? Authorizing Provider  acetaminophen (TYLENOL) 650 MG CR tablet Take 650 mg by mouth every 8 (eight) hours as needed for pain.   Yes [provider]  b complex-vitamin c-folic acid (NEPHRO-VITE) 0.8 MG TABS Take 1 tablet by mouth daily.    Yes [provider]    bromocriptine (PARLODEL) 5 MG capsule Take 5 mg by mouth every other day.  11/30/10  Yes [provider]  cetirizine (ZYRTEC) 10 MG tablet Take 10 mg by mouth at bedtime.    Yes [provider]  cinacalcet (SENSIPAR) 30 MG tablet Take 2 tablets (60 mg total) by mouth 2 (two) times daily with a meal. Patient taking differently: Take 60 mg by mouth at bedtime.  11/02/17  Yes Adhikari, Tamsen Meek, MD  [place] 125 MCG tablet TAKE 1/2 TABLET BY MOUTH EVERY DAY Patient taking differently: Take 0.0625 mg by mouth daily.  09/08/17  Yes Skeet Latch, MD  diltiazem (CARDIZEM CD) 120 MG 24 hr capsule Take 120 mg by mouth daily.    Yes [provider]  dorzolamide-timolol (COSOPT) 22.3-6.8 MG/ML ophthalmic solution Place 1 drop into both eyes 2 (two) times daily.  08/13/13  Yes [provider]  enoxaparin (LOVENOX) 80 MG/0.8ML injection Inject 0.8 mLs (80 mg total) into the skin daily. 12/06/17  Yes Skeet Latch, MD  gabapentin (NEURONTIN) 100 MG capsule Take 200 mg  by mouth at bedtime.    Yes [provider]  Nutritional Supplements (NEPRO) LIQD Take 237 mLs by mouth every Monday, Wednesday, and Friday with hemodialysis.    Yes [provider]  omeprazole (PRILOSEC) 20 MG capsule Take 20 mg by mouth daily.   Yes [provider]  pravastatin (PRAVACHOL) 10 MG tablet Take 1 tablet (10 mg total) by mouth daily. 08/08/17  Yes Skeet Latch, MD  predniSONE (DELTASONE) 5 MG tablet Take 5 mg by mouth at bedtime.  04/27/16  Yes [provider]  sertraline (ZOLOFT) 50 MG tablet Take 50 mg by mouth daily. 11/18/16  Yes [provider]  sevelamer carbonate (RENVELA) 800 MG tablet Take 2 tablets (1,600 mg total) by mouth 3 (three) times daily with meals. Patient taking differently: Take 2,400 mg by mouth 2 (two) times daily.  11/02/17  Yes Shelly Coss, MD  warfarin (COUMADIN) 5 MG tablet Take 1/2 to 1 tablet daily as directed by coumadin  clinic Patient taking differently: Take 2.5-5 mg by mouth See admin instructions. Take 2.5mg  by mouth daily on Monday, Wednesday and Friday, take 5mg  on Sunday, Tuesday, Thursday and Saturday 11/07/17  Yes Skeet Latch, MD    Physical Exam:  Vitals:   12/13/17 1215 12/13/17 1230  BP: (!) 153/89 (!) 167/64  Pulse: 73 66  Resp: 19 (!) 21  Temp:    SpO2: 99% 100%     Awake arousable falls back to sleep and slightly confused probably because of anesthetic  Arcus senilis  No pallor no icterus  No JVD-pacemaker in situ  Abdomen soft nontender Foley catheter noted urine draining clear  Neurologically can raise both legs off of bed  Grip strength in both hands is good  Chest is clinically clear with mild crackles posterolaterally left side   I have personally reviewed following labs and imaging studies  Labs:   Chloride 97 BUN/creatinine 32/6.6 anion gap 17  WBC 8.1  Hemoglobin 10.7 platelet 162  INR 1.0  Imaging studies:   n   Medical tests:   EKG independently reviewed: n    Test discussed with performing physician:  n   Decision to obtain old records:   n   Review and summation of old records:   n   Active Problems:   Bladder cancer (HCC)   Assessment/Plan Postop day 0 TURBT in a complicated medical patient TAVR history Atrial fibril status post PPM Saint Jude 02/2015 Recurrent bacteremia Mild thrombocytopenia ESRD MWF complicated by hypotension on Midrin Diabetes mellitus on medication last A1c 6.7 Low-grade hematochezia followed by Dr. Fuller Plan  Postop urological surgery-keep at [facility] at request of Dr. Glory Buff have contacted flow manager to let them know he needs a bed at [facility] tomorrow morning--- rounding physician either Dr. Roni Bread or 1 of [place] as hospitalist can transfer over-I will intimate nephrologist--- patient can resume anticoagulation in 48 hours from procedure-Dr. Melvia Heaps is aware of the patient and will put him on second shift at  12/14/2017 for dialysis  Atrial fibrillation permanent, Saint Jude dual-chamber implanted 02/2015 EP physician regarding the same-continue digoxin 0.0625, Cardizem 120 Note that the patient will need Midrin Monday Wednesday Friday with the dialysis to prevent hypotension It appears he is at home on warfarin specialized dosing and was put on bridging Lovenox for procedure Will probably need heparin in 48 hours as discussed  H/o TAVR--D/w Dr. Rica Mote antiplatelets --DOESN'T NEED AC FOR post-op TAVR   ESRD MWF-see above discussion-continue Sensipar 60 daily, Renvela 2.4  twice daily Obtain renal panel a.m. adjust accordingly per nephrology-is on prednisone nightly-unclear indication  DM TY 2 last A1c 6.7-gabapentin 200 at bedtime, continue sliding scale  Prior multiple infections including enterococcus-may need discussion with ID regarding PPM and source control-also has dialysis fistula which may need to be evaluated  Low-grade hematochezia-Per outpatient GI specialist  Severity of Illness: The appropriate patient status for this patient is INPATIENT. Inpatient status is judged to be reasonable and necessary in order to provide the required intensity of service to ensure the patient's safety. The patient's presenting symptoms, physical exam findings, and initial radiographic and laboratory data in the context of their chronic comorbidities is felt to place them at high risk for further clinical deterioration. Furthermore, it is not anticipated that the patient will be medically stable for discharge from the hospital within 2 midnights of admission. The following factors support the patient status of inpatient.   " The patient's presenting symptoms include high risk for bleeding in a patient with recent urological surgery. " The worrisome physical exam findings include none. " The initial radiographic and laboratory data are worrisome because of risk of bleeding and slightly low platelet  count. " The chronic co-morbidities include multiple.   * I certify that at the point of admission it is my clinical judgment that the patient will require inpatient hospital care spanning beyond 2 midnights from the point of admission due to high intensity of service, high risk for further deterioration and high frequency of surveillance required.*     DVT prophylaxis: SCDs at this time Code Status: Presumed full Family Communication: None present at the bedside-patient seen on PACU Consults called: Discussed with Dr. Johnsie Cancel of cardiology-for him patient does not need anticoagulation with a recent TAVR only usually antiplatelet so it is okay to resume chronic anticoagulation either with bridging versus heparin bridging in 48 hours as per the discretion of urology  Time spent: 75 minutes minutes  Verlon Au, MD  [facility] Direct contact: [phone] --[place] [place]  --[email or web address]; password [email or web address]  7PM-7AM contact night coverage as above  12/13/2017, 12:38 PM

## 2017-12-13 NOTE — Plan of Care (Signed)

## 2017-12-13 NOTE — Op Note (Signed)
Procedure: 1.  Cystoscopy with bilateral retrograde pyelography and interpretation. 2.  Left renal washings. 3.  Transurethral resection of large bladder tumor from overlapping sites.  Preop diagnosis: Bladder cancer with hematuria.  Postoperative diagnosis: 1.  Extensive bladder cancer with involvement of the dome, posterior wall, right and left lateral wall and base of the bladder. 2.  Possible filling defect in left lower pole.  Surgeon: Dr. Irine Seal.  Anesthesia: General.  Specimen: Bladder tumor chips.  Drain: 42 [place] Foley catheter.  EBL: Minimal.  Complications: None.  Indications: Alexander [name] is a 77 year old white male who has end-stage renal disease on dialysis and was recently found in evaluation for gross hematuria and have probable bladder cancer.  He is on warfarin for an aortic valve replacement and atrial fibrillation and was on Lovenox bridging prior to the procedure.  He had also been treated with Tressie Ellis for a positive urine culture.  He is severely oliguric.  Procedure: He was taken the operating room where he was given [place].  A general anesthetic was induced, he was intubated and paralytic was given as needed.  He was placed in the lithotomy position, fitted with PAS hose and his perineum and genitalia were prepped with Betadine solution.  He was draped in usual sterile fashion.  Cystoscopy was performed using the 23 [place] scope and 30 degree lens.  Examination revealed a normal urethra.  The external sphincter was intact.  The prostatic urethra was 2 to 3 cm in length with bilobar hyperplasia and a slightly elevated bladder neck.  There was some obstruction but it was not severe.  Inspection of the bladder demonstrated gross hematuria but after irrigation inspection revealed the ureteral orifice ease in the normal anatomic position.  There were extensive mucosal changes and papillary tumors from the right lateral wall up over the dome onto the left lateral wall as well  as the posterior wall and a few small lesions on the trigone.  The patient appeared to have some hemorrhagic changes on the upper posterior wall and dome and bleeding from this area.  Once initial inspection was performed bilateral retrograde pyelography was performed using a 5 French opening catheter and Omnipaque.  The right retrograde pyelogram revealed a normal ureter and only minimal changes of the lower pole calyceal system that are most consistent with changes from his renal failure.  The left retrograde pyelogram revealed a normal ureter and a generally delicate intrarenal collecting system but there was a question of a filling defect in the lower pole however once again papillary changes from his renal failure are possible as well.  At the left retrograde pyelogram, renal washings were performed on the left and sent for cytology using normal saline.  The urethra was then calibrated to 30 [place] with [employer] sounds and the 26 French continuous-flow resectoscope sheath was inserted.  This was fitted with an Beatrix Fetters handle a bipolar loop and the 30 degree lens.  Saline was used to the irrigant.  The resection was initiated on the right lateral wall or more of the larger tumors were noted.  It had some papillary features but also was rather nodular.  Additional resection was performed as indicated throughout the multiple areas of involvement with intervening areas that had findings more consistent with carcinoma in situ both resected and fulgurated.  There was a small diverticulum on the posterior wall but it did not appear to be involved.  The area of involvement exceeded 5 cm in greatest diameter.  Once resection  and then performed the bladder was evacuated free of chips and further inspection revealed a few additional areas of concern that were fulgurated and resected as needed.  Those chips were removed as well final inspection revealed no active bleeding no retained chips and no obvious  residual tumor however it is doubtful that the complete resection was performed due to the extent of the disease.  No evidence of bladder wall perforation or other injury was identified.  It was not felt that this patient would benefit from intravesical chemotherapy.  The resectoscope was removed and a 20 French Foley catheter was inserted.  The balloon was filled with 10 cc of sterile fluid.  The catheter was irrigated with clear return in place to straight drainage.  He was taken down from lithotomy position, his anesthetic was reversed and he was moved to recovery in stable condition.  There were no complications.  I will list the hospitalist service to aid in his postoperative care and he will be transferred to [facility] tomorrow for dialysis.

## 2017-12-13 NOTE — Transfer of Care (Signed)
Immediate Anesthesia Transfer of Care Note  Patient: Alexander [name]  Procedure(s) Performed: TRANSURETHRAL RESECTION OF BLADDER TUMOR (TURBT) (N/A ) CYSTOSCOPY WITH RETROGRADE PYELOGRAM (Bilateral )  Patient Location: PACU  Anesthesia Type:General  Level of Consciousness: awake and drowsy  Airway & Oxygen Therapy: Patient Spontanous Breathing and Patient connected to face mask oxygen  Post-op Assessment: Report given to RN and Post -op Vital signs reviewed and stable  Post vital signs: Reviewed and stable  Last Vitals:  Vitals Value Taken Time  BP 165/77 12/13/2017 12:06 PM  Temp    Pulse 61 12/13/2017 12:10 PM  Resp 17 12/13/2017 12:10 PM  SpO2 100 % 12/13/2017 12:10 PM  Vitals shown include unvalidated device data.  Last Pain:  Vitals:   12/13/17 0828  TempSrc: Oral         Complications: No apparent anesthesia complications

## 2017-12-13 NOTE — Anesthesia Postprocedure Evaluation (Signed)
Anesthesia Post Note  Patient: Alexander [name]  Procedure(s) Performed: TRANSURETHRAL RESECTION OF BLADDER TUMOR (TURBT) (N/A ) CYSTOSCOPY WITH RETROGRADE PYELOGRAM (Bilateral )     Patient location during evaluation: PACU Anesthesia Type: General Level of consciousness: awake and alert Pain management: pain level controlled Vital Signs Assessment: post-procedure vital signs reviewed and stable Respiratory status: spontaneous breathing, nonlabored ventilation and respiratory function stable Cardiovascular status: blood pressure returned to baseline and stable Postop Assessment: no apparent nausea or vomiting Anesthetic complications: no    Last Vitals:  Vitals:   12/13/17 1330 12/13/17 1400  BP: (!) 162/83 (!) 144/72  Pulse: 64 75  Resp: 19 17  Temp: (!) 36.4 C (!) 36.4 C  SpO2: 94% 97%    Last Pain:  Vitals:   12/13/17 1400  TempSrc: Oral  PainSc:                  Lynda Rainwater

## 2017-12-13 NOTE — Anesthesia Preprocedure Evaluation (Addendum)
Anesthesia Evaluation  Patient identified by MRN, date of birth, ID band Patient awake    Reviewed: Allergy & Precautions, NPO status , Patient's Chart, lab work & pertinent test results  Airway Mallampati: II  TM Distance: >3 FB Neck ROM: Full    Dental  (+) Teeth Intact, Dental Advisory Given   Pulmonary former smoker,    breath sounds clear to auscultation       Cardiovascular hypertension, Pt. on medications + pacemaker  Rhythm:Irregular Rate:Normal     Neuro/Psych Depression CVA    GI/Hepatic GERD  ,  Endo/Other  diabetes, Type 2  Renal/GU ESRF and DialysisRenal disease     Musculoskeletal   Abdominal   Peds  Hematology  (+) anemia ,   Anesthesia Other Findings   Reproductive/Obstetrics                            Anesthesia Physical  Anesthesia Plan  ASA: IV  Anesthesia Plan: General   Post-op Pain Management:    Induction: Intravenous  PONV Risk Score and Plan: 2 and Ondansetron, Midazolam and Treatment may vary due to age or medical condition  Airway Management Planned: Oral ETT and LMA  Additional Equipment:   Intra-op Plan:   Post-operative Plan: Extubation in OR  Informed Consent: I have reviewed the patients History and Physical, chart, labs and discussed the procedure including the risks, benefits and alternatives for the proposed anesthesia with the patient or authorized representative who has indicated his/her understanding and acceptance.   Dental advisory given  Plan Discussed with: CRNA and Anesthesiologist  Anesthesia Plan Comments:        Anesthesia Quick Evaluation

## 2017-12-13 NOTE — Consult Note (Signed)
Renal Service Consult Note [facility]  Alexander [name] 12/13/2017 [place] Requesting Physician:  Dr. Verlon Au  Reason for Consult:  ESRD pt sp TURBT HPI: The patient is a 77 y.o. year-old with hx of CVA w/ left hemi, ESRD HD MWF, TAVRS 2018, PPM, PAF, chronic hypotension on midodrine, legally blind from diab retinopathy, COPD quit smoking, past hx DM not on medication was admitted after TURBT procedure done by urology today.  Asked to see for dialysis tomorrow.    Pt in good spirits, two daughters at bedside.  No c/o per pt, no SOB, cough, abd pain or CP.  No recent HD issues or access issues per pt.  Goes to all his HD sessions.    ROS  denies CP  no joint pain   no HA  no blurry vision  no rash  no diarrhea  no nausea/ vomiting    Past Medical History  Past Medical History:  Diagnosis Date  . Anemia   . Aortic stenosis 06/15/12   TEE - EF 25-36%; grade 1 diastolic dysfunction; mild/mod aortic valve stenosis; Mitral valve had calcified annulus, mild pulm htn PA peak pressure 87mmHg  . Barrett's esophagus 05/2003  . Bradycardia 2017   [employer] 2240 Assurity dual-lead pacemaker  . Carpal tunnel syndrome, bilateral 11/03/2015  . Colon polyps   . CVA (cerebral infarction)    2004/affected left side  . Depression   . Diabetes mellitus without complication ([place])   . Diabetic peripheral neuropathy ([place]) 10/02/2015  . Diverticulosis   . Dyspnea    with exertion  . ESRD (end stage renal disease) on dialysis ([place])    "[facility]; [place]; MWF" (05/12/2017)  . Failure to thrive syndrome, adult 10/30/2017  . GERD (gastroesophageal reflux disease)   . Glaucoma   . History of kidney stones   . Hyperlipidemia   . Hypertension   . Legally blind   . Macular degeneration    both eyes  . Orthostatic hypotension 09/09/2015  . Paroxysmal atrial fibrillation (HCC)   . Peptic ulcer    bleeding, 1969  . Presence of permanent cardiac pacemaker   . S/P epidural  steroid injection    last  injection over 10 years ago  . Seasonal allergies   . Tubular adenoma of colon 07/2001   Past Surgical History  Past Surgical History:  Procedure Laterality Date  . AV FISTULA PLACEMENT  2009  . BACK SURGERY    . CARDIOVERSION N/A 11/13/2015   Procedure: CARDIOVERSION;  Surgeon: Troy Sine, MD;  Location: [facility] ENDOSCOPY;  Service: Cardiovascular;  Laterality: N/A;  . CARDIOVERSION N/A 01/13/2016   Procedure: CARDIOVERSION;  Surgeon: Will Meredith [name], MD;  Location: [place];  Service: Cardiovascular;  Laterality: N/A;  . [place]   right eye  . CYSTOSCOPY  several times   kidney stones  . EP IMPLANTABLE DEVICE N/A 03/11/2015   Procedure: Pacemaker Implant;  Surgeon: Will Meredith [name], MD;  [place];  Laterality: Left  . LAMINECTOMY  1969  . RIGHT/LEFT HEART CATH AND CORONARY ANGIOGRAPHY N/A 08/20/2016   Procedure: Right/Left Heart Cath and Coronary Angiography;  Surgeon: Burnell Blanks, MD;  Location: [place] CV LAB;  Service: Cardiovascular;  Laterality: N/A;  . TEE WITHOUT CARDIOVERSION N/A 07/22/2016   Procedure: TRANSESOPHAGEAL ECHOCARDIOGRAM (TEE);  Surgeon: Skeet Latch, MD;  Location: [place];  Service: Cardiovascular;  Laterality: N/A;  . TEE WITHOUT CARDIOVERSION N/A 08/31/2016   Procedure:  TRANSESOPHAGEAL ECHOCARDIOGRAM (TEE);  Surgeon: Burnell Blanks, MD;  Location: [place];  Service: Open Heart Surgery;  Laterality: N/A;  . TEE WITHOUT CARDIOVERSION N/A 12/01/2016   Procedure: TRANSESOPHAGEAL ECHOCARDIOGRAM (TEE);  Surgeon: Larey Dresser, MD;  Location: [facility] ENDOSCOPY;  Service: Cardiovascular;  Laterality: N/A;  . TONSILLECTOMY  1964  . TRANSCATHETER AORTIC VALVE REPLACEMENT, TRANSFEMORAL N/A 08/31/2016   Procedure: TRANSCATHETER AORTIC VALVE REPLACEMENT, TRANSFEMORAL;  Surgeon: Burnell Blanks, MD;  Location: [place];  Service: Open Heart Surgery;  Laterality: N/A;    Family History  Family History  Problem Relation Age of Onset  . Stomach cancer Mother   . Hypertension Father        Died of heart attack  . Heart attack Father   . Stroke Sister   . Heart disease Sister   . Cancer Brother   . Colon cancer Neg Hx    Social History  reports that he quit smoking about 19 years ago. His smoking use included cigarettes. He has a 90.00 pack-year smoking history. He has never used smokeless tobacco. He reports that he does not drink alcohol or use drugs. Allergies  Allergies  Allergen Reactions  . Penicillins Swelling and Rash    Has patient had a PCN reaction causing immediate rash, facial/tongue/throat swelling, SOB or lightheadedness with hypotension: Yes Has patient had a PCN reaction causing severe rash involving mucus membranes or skin necrosis: No Has patient had a PCN reaction that required hospitalization: No Has patient had a PCN reaction occurring within the last 10 years: No If all of the above answers are "NO", then may proceed with Cephalosporin use.   . Atorvastatin     MYALGIA   . Codeine Nausea Only  . Tramadol Nausea Only   Home medications Prior to Admission medications   Medication Sig Start Date End Date Taking? Authorizing Provider  acetaminophen (TYLENOL) 650 MG CR tablet Take 650 mg by mouth every 8 (eight) hours as needed for pain.   Yes [provider]  b complex-vitamin c-folic acid (NEPHRO-VITE) 0.8 MG TABS Take 1 tablet by mouth daily.    Yes [provider]  bromocriptine (PARLODEL) 5 MG capsule Take 5 mg by mouth every other day.  11/30/10  Yes [provider]  cetirizine (ZYRTEC) 10 MG tablet Take 10 mg by mouth at bedtime.    Yes [provider]  cinacalcet (SENSIPAR) 30 MG tablet Take 2 tablets (60 mg total) by mouth 2 (two) times daily with a meal. Patient taking differently: Take 60 mg by mouth at bedtime.  11/02/17  Yes Adhikari, Tamsen Meek, MD  [place] 125 MCG tablet TAKE 1/2 TABLET  BY MOUTH EVERY DAY Patient taking differently: Take 0.0625 mg by mouth daily.  09/08/17  Yes Skeet Latch, MD  diltiazem (CARDIZEM CD) 120 MG 24 hr capsule Take 120 mg by mouth daily.    Yes [provider]  dorzolamide-timolol (COSOPT) 22.3-6.8 MG/ML ophthalmic solution Place 1 drop into both eyes 2 (two) times daily.  08/13/13  Yes [provider]  enoxaparin (LOVENOX) 80 MG/0.8ML injection Inject 0.8 mLs (80 mg total) into the skin daily. 12/06/17  Yes Skeet Latch, MD  gabapentin (NEURONTIN) 100 MG capsule Take 200 mg by mouth at bedtime.    Yes [provider]  Nutritional Supplements (NEPRO) LIQD Take 237 mLs by mouth every Monday, Wednesday, and Friday with hemodialysis.    Yes [provider]  omeprazole (PRILOSEC) 20 MG capsule Take 20 mg by mouth  daily.   Yes [provider]  pravastatin (PRAVACHOL) 10 MG tablet Take 1 tablet (10 mg total) by mouth daily. 08/08/17  Yes Skeet Latch, MD  predniSONE (DELTASONE) 5 MG tablet Take 5 mg by mouth at bedtime.  04/27/16  Yes [provider]  sertraline (ZOLOFT) 50 MG tablet Take 50 mg by mouth daily. 11/18/16  Yes [provider]  sevelamer carbonate (RENVELA) 800 MG tablet Take 2 tablets (1,600 mg total) by mouth 3 (three) times daily with meals. Patient taking differently: Take 2,400 mg by mouth 2 (two) times daily.  11/02/17  Yes Shelly Coss, MD  warfarin (COUMADIN) 5 MG tablet Take 1/2 to 1 tablet daily as directed by coumadin clinic Patient taking differently: Take 2.5-5 mg by mouth See admin instructions. Take 2.5mg  by mouth daily on Monday, Wednesday and Friday, take 5mg  on Sunday, Tuesday, Thursday and Saturday 11/07/17  Yes Skeet Latch, MD   Liver Function Tests No results for input(s): AST, ALT, ALKPHOS, BILITOT, PROT, ALBUMIN in the last 168 hours. No results for input(s): LIPASE, AMYLASE in the last 168 hours. CBC Recent Labs  Lab 12/13/17 0853  WBC 8.1   HGB 10.7*  HCT 34.5*  MCV 104.5*  PLT 177   Basic Metabolic Panel Recent Labs  Lab 12/13/17 0853  NA 144  K 3.9  CL 97*  CO2 30  GLUCOSE 156*  BUN 32*  CREATININE 6.60*  CALCIUM 9.3   Iron/TIBC/Ferritin/ %Sat No results found for: IRON, TIBC, FERRITIN, IRONPCTSAT  Vitals:   12/13/17 1300 12/13/17 1315 12/13/17 1330 12/13/17 1400  BP: (!) 168/81 (!) 162/65 (!) 162/83 (!) 144/72  Pulse: 71 66 64 75  Resp: 17 17 19 17   Temp:   (!) 97.5 F (36.4 C) (!) 97.5 F (36.4 C)  TempSrc:    Oral  SpO2: 100% 97% 94% 97%  Weight:      Height:       Exam Gen alert, blind, no distress, pleasant No rash, cyanosis or gangrene Sclera anicteric, throat clear  No jvd or bruits Chest clear bilat to bases, no wheezing or rhonchi RRR +2/6 sem no RG Abd soft ntnd no mass or ascites +bs GU normal male w/ foley cath MS no joint effusions or deformity Ext no LE or UE edema, no wounds or ulcers Neuro is alert, Ox3, nonfocal    Home meds:  - digoxin 0.0625 mg qd/ diltiazem cd 120 qd/ warfarin 2.5-5 qd  - sertraline 50 qd/ gabapentin 200 hs  - bromocriptine 5 qod/ omeprazole 40 qd/ pravastatin 10 qd  - prednisone 5 hs  - sevelamer carbonate 1600 tid ac/ cinacalcet 60 bid   Dialysis: MWF HD  4h   87kg   2/2 bath   LFA AVF  Hep 2800 - mircera 100 ug every 2 wks - hect 3 ug tiw    Impression: 1. SP TURBT - path pending 2. ESRD HD MWF. Midodrine pre HD tiw 3. Vol - is stable on exam but up 5kg by wt 4. PAF - cont meds 5. SP TAVR 6. SP PPM 7. Hx CVA w/ left hemi 8. MBD ckd - cont meds 9. Anemia ckd - Hb > 11, no esa needed now   Plan - HD tomorrow at [facility], UF 2-3 L as tol   Kelly Splinter MD [employer] pager [phone]   12/13/2017, 4:44 PM

## 2017-12-13 NOTE — Anesthesia Procedure Notes (Signed)
Procedure Name: Intubation Date/Time: 12/13/2017 10:40 AM Performed by: Gwyndolyn Saxon, CRNA Pre-anesthesia Checklist: Patient identified, Emergency Drugs available, Suction available, Patient being monitored and Timeout performed Patient Re-evaluated:Patient Re-evaluated prior to induction Oxygen Delivery Method: Circle system utilized Preoxygenation: Pre-oxygenation with 100% oxygen Induction Type: IV induction Ventilation: Mask ventilation without difficulty Laryngoscope Size: Mac and 3 Grade View: Grade I Tube type: Oral Tube size: 7.5 mm Number of attempts: 1 Airway Equipment and Method: Patient positioned with wedge pillow and Stylet Placement Confirmation: ETT inserted through vocal cords under direct vision,  positive ETCO2 and breath sounds checked- equal and bilateral Secured at: 23 cm Tube secured with: Tape Dental Injury: Teeth and Oropharynx as per pre-operative assessment  Comments: Intubated by Viann Fish, SRNA

## 2017-12-14 ENCOUNTER — Encounter (HOSPITAL_COMMUNITY): Payer: Self-pay | Admitting: Urology

## 2017-12-14 DIAGNOSIS — C678 Malignant neoplasm of overlapping sites of bladder: Secondary | ICD-10-CM | POA: Diagnosis not present

## 2017-12-14 DIAGNOSIS — Z9079 Acquired absence of other genital organ(s): Secondary | ICD-10-CM | POA: Insufficient documentation

## 2017-12-14 DIAGNOSIS — I120 Hypertensive chronic kidney disease with stage 5 chronic kidney disease or end stage renal disease: Secondary | ICD-10-CM | POA: Diagnosis not present

## 2017-12-14 DIAGNOSIS — I509 Heart failure, unspecified: Secondary | ICD-10-CM | POA: Diagnosis not present

## 2017-12-14 DIAGNOSIS — R310 Gross hematuria: Secondary | ICD-10-CM | POA: Diagnosis not present

## 2017-12-14 DIAGNOSIS — N2581 Secondary hyperparathyroidism of renal origin: Secondary | ICD-10-CM | POA: Diagnosis not present

## 2017-12-14 DIAGNOSIS — D631 Anemia in chronic kidney disease: Secondary | ICD-10-CM | POA: Diagnosis not present

## 2017-12-14 DIAGNOSIS — E1122 Type 2 diabetes mellitus with diabetic chronic kidney disease: Secondary | ICD-10-CM | POA: Diagnosis not present

## 2017-12-14 DIAGNOSIS — M6281 Muscle weakness (generalized): Secondary | ICD-10-CM | POA: Diagnosis not present

## 2017-12-14 DIAGNOSIS — Z992 Dependence on renal dialysis: Secondary | ICD-10-CM | POA: Diagnosis not present

## 2017-12-14 DIAGNOSIS — I132 Hypertensive heart and chronic kidney disease with heart failure and with stage 5 chronic kidney disease, or end stage renal disease: Secondary | ICD-10-CM | POA: Diagnosis not present

## 2017-12-14 DIAGNOSIS — G9782 Other postprocedural complications and disorders of nervous system: Secondary | ICD-10-CM

## 2017-12-14 DIAGNOSIS — N186 End stage renal disease: Secondary | ICD-10-CM | POA: Diagnosis not present

## 2017-12-14 DIAGNOSIS — I951 Orthostatic hypotension: Secondary | ICD-10-CM | POA: Diagnosis not present

## 2017-12-14 LAB — RENAL FUNCTION PANEL
Albumin: 3.2 g/dL — ABNORMAL LOW (ref 3.5–5.0)
Anion gap: 16 — ABNORMAL HIGH (ref 5–15)
BUN: 47 mg/dL — AB (ref 8–23)
CALCIUM: 9.2 mg/dL (ref 8.9–10.3)
CHLORIDE: 95 mmol/L — AB (ref 98–111)
CO2: 30 mmol/L (ref 22–32)
CREATININE: 8.13 mg/dL — AB (ref 0.61–1.24)
GFR calc non Af Amer: 6 mL/min — ABNORMAL LOW (ref >60)
GFR, EST AFRICAN AMERICAN: 6 mL/min — AB (ref >60)
Glucose, Bld: 195 mg/dL — ABNORMAL HIGH (ref 70–99)
Phosphorus: 5 mg/dL — ABNORMAL HIGH (ref 2.5–4.6)
Potassium: 5.2 mmol/L — ABNORMAL HIGH (ref 3.5–5.1)
SODIUM: 141 mmol/L (ref 135–145)

## 2017-12-14 LAB — CBC
HEMATOCRIT: 33.8 % — AB (ref 39.0–52.0)
HEMOGLOBIN: 10.6 g/dL — AB (ref 13.0–17.0)
MCH: 32.4 pg (ref 26.0–34.0)
MCHC: 31.4 g/dL (ref 30.0–36.0)
MCV: 103.4 fL — AB (ref 78.0–100.0)
Platelets: 162 10*3/uL (ref 150–400)
RBC: 3.27 MIL/uL — AB (ref 4.22–5.81)
RDW: 19.7 % — ABNORMAL HIGH (ref 11.5–15.5)
WBC: 15.6 10*3/uL — AB (ref 4.0–10.5)

## 2017-12-14 LAB — PROTIME-INR
INR: 1.04
Prothrombin Time: 13.5 seconds (ref 11.4–15.2)

## 2017-12-14 MED ORDER — LIDOCAINE-PRILOCAINE 2.5-2.5 % EX CREA: 1.0000 "application " | TOPICAL_CREAM | CUTANEOUS | Status: DC | PRN

## 2017-12-14 MED ORDER — PENTAFLUOROPROP-TETRAFLUOROETH EX AERO: 1.0000 "application " | INHALATION_SPRAY | CUTANEOUS | Status: DC | PRN

## 2017-12-14 MED ORDER — SODIUM CHLORIDE 0.9 % IV SOLN: 100.0000 mL | INTRAVENOUS | Status: DC | PRN

## 2017-12-14 NOTE — Progress Notes (Addendum)
[facility] Progress Note   Subjective:  Seen on HD today - 3L UF goal. Tolerating so far. Denies CP or dyspnea. No abd pain; has not urinated sine having surgery but this is not unusual for him.  Objective Vitals:   12/14/17 1300 12/14/17 1302 12/14/17 1305 12/14/17 1330  BP: 129/64 132/64 133/60 (!) 123/54  Pulse: 61 64 63 64  Resp: 18     Temp: 97.6 F (36.4 C)     TempSrc: Oral     SpO2: 96%     Weight: 90.8 kg     Height:       Physical Exam General: Elderly man, NAD Heart: RRR; 2/6 systolic murmur Lungs: CTA anteriorly Abdomen: soft, non-tender Extremities: No LE edema Dialysis Access:  AVF (cannulated)  Additional Objective Labs: Basic Metabolic Panel: Recent Labs  Lab 12/13/17 0853 12/14/17 0527  NA 144 141  K 3.9 5.2*  CL 97* 95*  CO2 30 30  GLUCOSE 156* 195*  BUN 32* 47*  CREATININE 6.60* 8.13*  CALCIUM 9.3 9.2  PHOS  --  5.0*   Liver Function Tests: Recent Labs  Lab 12/14/17 0527  ALBUMIN 3.2*   CBC: Recent Labs  Lab 12/13/17 0853 12/14/17 0527  WBC 8.1 15.6*  HGB 10.7* 10.6*  HCT 34.5* 33.8*  MCV 104.5* 103.4*  PLT 162 162   CBG: Recent Labs  Lab 12/08/17 1101 12/13/17 1235  GLUCAP 171* 138*   Medications: . sodium chloride 10 mL/hr at 12/14/17 0600   . bromocriptine  5 mg Oral QODAY  . Chlorhexidine Gluconate Cloth  6 each Topical Q0600  . cinacalcet  60 mg Oral QHS  . digoxin  0.0625 mg Oral Daily  . diltiazem  120 mg Oral Daily  . dorzolamide-timolol  1 drop Both Eyes BID  . feeding supplement (NEPRO CARB STEADY)  237 mL Oral Q M,W,F  . gabapentin  200 mg Oral QHS  . Influenza vac split quadrivalent PF  0.5 mL Intramuscular Tomorrow-1000  . loratadine  10 mg Oral Daily  . multivitamin  1 tablet Oral QHS  . pantoprazole  40 mg Oral Daily  . pravastatin  10 mg Oral Daily  . predniSONE  5 mg Oral QHS  . sertraline  50 mg Oral Daily  . sevelamer carbonate  2,400 mg Oral BID WC   Home meds:  - digoxin  0.0625 mg qd/ diltiazem cd 120 qd/ warfarin 2.5-5 qd  - sertraline 50 qd/ gabapentin 200 hs  - bromocriptine 5 qod/ omeprazole 40 qd/ pravastatin 10 qd  - prednisone 5 hs  - sevelamer carbonate 1600 tid ac/ cinacalcet 60 bid   Dialysis Orders: MWF HD  4h   87kg   2/2 bath   LFA AVF  Hep 2800 - mircera 100 ug every 2 wks - hect 3 ug tiw  Assessment/Plan: 1. Bladder mass (s/p TURBT): Path pending. 2. ESRD: HD today per usual MWF schedule. Midodrine pre-HD, no heparin. 3. HTN/volume: BP stable, 3L UF goal if tolerated. 4. Anemia: Hgb 10.6 - stable 5. Secondary hyperparathyroidism: Ca/phos ok - continue home meds. 6. Hx PPM 7. Hx TAVR 8. Hx CVA 9. Paroxysmal A-fib 10. Dispo: Ok for discharge from renal perspective once cleared by urology. Can resume usual outpatient HD on Friday 9/20.  Veneta Penton, PA-C 12/14/2017, 2:25 PM  [facility] Pager: [phone]  Pt seen, examined and agree w A/P as above.  Kelly Splinter MD [employer] pager [phone]   12/14/2017,  4:28 PM

## 2017-12-14 NOTE — Progress Notes (Signed)
Talked to the [facility] urology nurse regarding the discharge orders for the patient as nephrology is ok to release him.  Alexander [name] dialysis ph number to the nurse.  She is going to touch base with the Dr. Jeffie Pollock Urologist regarding the issue and will get back to the HD unit.

## 2017-12-14 NOTE — Progress Notes (Signed)
Delay in transfer to [facility] while determining who is receiving MD. MD Jeffie Pollock states he will be receiving MD at this time. [facility] made aware. [facility] HD Unit also made aware. Pt ready for transfer.

## 2017-12-14 NOTE — Progress Notes (Signed)
Patient transferred via [place]. Report also given to Ronny Bacon in [facility] HD Unit. [facility] notified to take pt directly to HD Unit per request. Daughter and wife at bedside and aware of transfer. All belongings sent with pt.

## 2017-12-14 NOTE — Discharge Instructions (Signed)
CYSTOSCOPY HOME CARE INSTRUCTIONS  Activity: Rest for the remainder of the day.  Do not drive or operate equipment today.  You may resume normal activities in one to two days as instructed by your physician.   Meals: Drink plenty of liquids and eat light foods such as gelatin or soup this evening.  You may return to a normal meal plan tomorrow.  Return to Work: You may return to work in one to two days or as instructed by your physician.  Special Instructions / Symptoms: Call your physician if any of these symptoms occur:   -persistent or heavy bleeding  -bleeding which continues after first few urination  -large blood clots that are difficult to pass  -urine stream diminishes or stops completely  -fever equal to or higher than 101 degrees Farenheit.  -cloudy urine with a strong, foul odor  -severe pain  Females should always wipe from front to back after elimination.  You may feel some burning pain when you urinate.  This should disappear with time.  Applying moist heat to the lower abdomen or a hot tub bath may help relieve the pain. \  You may resume Lovenox and warfarin on 9/19 if no active bleeding.   Patient Signature:  ________________________________________________________  Nurse's Signature:  ________________________________________________________

## 2017-12-14 NOTE — Care Management (Signed)
Patient did not received Code 44. Patient was discharged from HD before ED CM arrived to hemodialysis.

## 2017-12-14 NOTE — Progress Notes (Signed)
1 Day Post-Op  Assessment and Plan: Bladder cancer POD#1 from TURBT for extensive bladder involvement.  Urine output is scant with ESRD and while the urine is slightly bloody there appears to be no active bleeding.  I will have the foley removed.  Could resume anticoagulation tomorrow if no active bleeding with SQ heparin if still in house or Lovenox if at home.  ESRD for dialysis at [facility] today.  Transfer order written.   He could be discharged home post dialysis if ok from a nephrology/medical standpoint.    Subjective: Alexander [name] is doing well without complaints this morning following TURBT for extensive bladder cancer on 9/17.   He has scant UOP with his ESRD and there is some blooding the urine but he doesn't appear to be actively bleeding.    ROS:  Review of Systems  All other systems reviewed and are negative.   Anti-infectives: Anti-infectives (From admission, onward)   Start     Dose/Rate Route Frequency Ordered Stop   12/13/17 0845  cefTAZidime (FORTAZ) 500 mg in dextrose 5 % 50 mL IVPB     500 mg 100 mL/hr over 30 Minutes Intravenous  Once 12/13/17 0830 12/13/17 1121      Current Facility-Administered Medications  Medication Dose Route Frequency Provider Last Rate Last Dose  . 0.9 %  sodium chloride infusion   Intravenous Continuous Irine Seal, MD 10 mL/hr at 12/14/17 0600    . acetaminophen (TYLENOL) tablet 650 mg  650 mg Oral Q4H PRN Irine Seal, MD      . bisacodyl (DULCOLAX) suppository 10 mg  10 mg Rectal Daily PRN Irine Seal, MD      . bromocriptine (PARLODEL) tablet 5 mg  5 mg Oral Audelia Hives, MD      . Chlorhexidine Gluconate Cloth 2 % PADS 6 each  6 each Topical Q0600 Roney Jaffe, MD   6 each at 12/14/17 0631  . cinacalcet (SENSIPAR) tablet 60 mg  60 mg Oral QHS Irine Seal, MD   60 mg at 12/13/17 2139  . digoxin (LANOXIN) tablet 0.0625 mg  0.0625 mg Oral Daily Irine Seal, MD   0.0625 mg at 12/13/17 1804  . diltiazem (CARDIZEM CD) 24 hr capsule 120  mg  120 mg Oral Daily Irine Seal, MD   120 mg at 12/13/17 1804  . dorzolamide-timolol (COSOPT) 22.3-6.8 MG/ML ophthalmic solution 1 drop  1 drop Both Eyes BID Irine Seal, MD   1 drop at 12/13/17 2139  . feeding supplement (NEPRO CARB STEADY) liquid 237 mL  237 mL Oral Q M,W,F Samtani, Jai-Gurmukh, MD      . gabapentin (NEURONTIN) capsule 200 mg  200 mg Oral QHS Irine Seal, MD   200 mg at 12/13/17 2138  . HYDROmorphone (DILAUDID) injection 0.2 mg  0.2 mg Intravenous Q4H PRN Irine Seal, MD      . HYDROmorphone (DILAUDID) tablet 2 mg  2 mg Oral Q6H PRN Irine Seal, MD      . Influenza vac split quadrivalent PF (FLUZONE HIGH-DOSE) injection 0.5 mL  0.5 mL Intramuscular Tomorrow-1000 Samtani, Jai-Gurmukh, MD      . loratadine (CLARITIN) tablet 10 mg  10 mg Oral Daily Irine Seal, MD      . multivitamin (RENA-VIT) tablet 1 tablet  1 tablet Oral QHS Irine Seal, MD   1 tablet at 12/13/17 2138  . ondansetron (ZOFRAN) injection 4 mg  4 mg Intravenous Q4H PRN Irine Seal, MD      . pantoprazole (PROTONIX) EC  tablet 40 mg  40 mg Oral Daily Irine Seal, MD      . pravastatin (PRAVACHOL) tablet 10 mg  10 mg Oral Daily Irine Seal, MD      . predniSONE (DELTASONE) tablet 5 mg  5 mg Oral QHS Irine Seal, MD   5 mg at 12/13/17 2139  . senna-docusate (Senokot-S) tablet 1 tablet  1 tablet Oral QHS PRN Irine Seal, MD      . sertraline (ZOLOFT) tablet 50 mg  50 mg Oral Daily Irine Seal, MD      . sevelamer carbonate (RENVELA) tablet 2,400 mg  2,400 mg Oral BID WC Irine Seal, MD   2,400 mg at 12/13/17 1804     Objective: Vital signs in last 24 hours: Temp:  [97.5 F (36.4 C)-98.2 F (36.8 C)] 97.7 F (36.5 C) (09/18 0505) Pulse Rate:  [60-76] 64 (09/18 0505) Resp:  [12-21] 18 (09/18 0505) BP: (117-175)/(58-89) 118/58 (09/18 0505) SpO2:  [94 %-100 %] 94 % (09/18 0505) Weight:  [92.7 kg] 92.7 kg (09/17 0840)  Intake/Output from previous day: 09/17 0701 - 09/18 0700 In: 709 [I.V.:659; IV  Piggyback:50] Out: 180 [Urine:175; Blood:5] Intake/Output this shift: Total I/O In: 159 [I.V.:159] Out: 175 [Urine:175]   Physical Exam  Constitutional: He appears well-developed and well-nourished.  Cardiovascular: Normal rate and regular rhythm.  Pulmonary/Chest: Effort normal. No respiratory distress.  Vitals reviewed.   Lab Results:  Recent Labs    12/13/17 0853 12/14/17 0527  WBC 8.1 15.6*  HGB 10.7* 10.6*  HCT 34.5* 33.8*  PLT 162 162   BMET Recent Labs    12/13/17 0853 12/14/17 0527  NA 144 141  K 3.9 5.2*  CL 97* 95*  CO2 30 30  GLUCOSE 156* 195*  BUN 32* 47*  CREATININE 6.60* 8.13*  CALCIUM 9.3 9.2   PT/INR Recent Labs    12/13/17 0853 12/14/17 0527  LABPROT 13.1 13.5  INR 1.00 1.04   ABG No results for input(s): PHART, HCO3 in the last 72 hours.  Invalid input(s): PCO2, PO2  Studies/Results: Dg C-arm 1-60 Min-no Report  Result Date: 12/13/2017 Fluoroscopy was utilized by the requesting physician.  No radiographic interpretation.          LOS: 1 day    Irine Seal 12/14/[phone] ID: Alexander [name], male   DOB: [birth year], 77 y.o.   MRN: [id number]

## 2017-12-14 NOTE — Progress Notes (Signed)
Pt. Discharged home. Cleared via Dr. Jeffie Pollock urology progress note and nephrology Dr. Jonnie Finner progress note. Dr. Jonnie Finner and Veneta Penton PA present at pt. Bedside ok for discharge home. Pt. Stable.Fistula site dry and intact. IV d/c x1 right wrist. Discharged via private vehicle with spouse.

## 2017-12-14 NOTE — Consult Note (Signed)
PROGRESS NOTE [facility]   JAYTHAN HINELY   DJM:[id number] DOB: 1940-10-03  DOA: 12/13/2017 PCP: Deland Pretty, MD   Brief Narrative:  MALE Alexander [name] is a 77 year old male with medical history of A. fib, + PPM, diabetes mellitus type 2, end-stage renal disease, TAVR, emphysema, carotid artery occlusion on the left, legal blindness due to macular degeneration.  Patient was admitted after having routine Rx and TURBT by urology.  Surgery went as planned however while in the PACU patient remained lethargic and sleepy.  Internal medicine was consulted for further evaluation.  Subjective: Patient seen and examined, he has no complaints this morning.  Feeling well.  Foley was removed.  Patient afebrile.  Assessment & Plan: Status post TURBT, postsurgical evaluation.  Patient currently awake doing well postop.  Lethargy felt to be likely from anesthesia in a renal patient.  Continue plan per urology.  No further medical work-up needed.  Permanent atrial fibrillation, Saint Jude dual-chamber implanted 02/2015.  Patient currently on digoxin and Cardizem.  Patient also in [place] on Monday, Wednesday and Fridays with dialysis to prevent hypotension.  Prior to admission was on warfarin okay to resume warfarin on 9/19 if okay with urology.  ESRD on HD MWF Patient scheduled for hemodialysis today, nephrology aware.  From medical standpoint patient can be discharged after hemodialysis.  Continue Sensipar 60 mg daily, Renvela 2.  4 mg twice daily.  Diabetes mellitus type 2 Last with A1c 6.7.  Not on any medications at home.  Monitor glucose closely as patient is on chronic prednisone.  Follow-up with PCP.  Dispo:  Medical team will sign off. Ok to d/c home per urology recommendation   Procedures:   TURBT  Antimicrobials: Anti-infectives (From admission, onward)   Start     Dose/Rate Route Frequency Ordered Stop   12/13/17 0845  cefTAZidime (FORTAZ) 500 mg in dextrose 5 % 50 mL IVPB     500  mg 100 mL/hr over 30 Minutes Intravenous  Once 12/13/17 0830 12/13/17 1121          Objective: Vitals:   12/13/17 1330 12/13/17 1400 12/13/17 2040 12/14/17 0505  BP: (!) 162/83 (!) 144/72 (!) 156/67 (!) 118/58  Pulse: 64 75 67 64  Resp: 19 17 18 18   Temp: (!) 97.5 F (36.4 C) (!) 97.5 F (36.4 C) 97.8 F (36.6 C) 97.7 F (36.5 C)  TempSrc:  Oral Oral Oral  SpO2: 94% 97% 98% 94%  Weight:      Height:        Intake/Output Summary (Last 24 hours) at 12/14/2017 1234 Last data filed at 12/14/2017 0600 Gross per 24 hour  Intake 208.95 ml  Output 175 ml  Net 33.95 ml   Filed Weights   12/13/17 0840  Weight: 92.7 kg    Examination:  General: NAD Cardiovascular: RRR, S1/S2 +, no rubs, no gallops Respiratory: CTA bilaterally, no wheezing, no rhonchi Abdominal: Soft, NT, ND, bowel sounds + Extremities: no edema  Data Reviewed: I have personally reviewed following labs and imaging studies  CBC: Recent Labs  Lab 12/13/17 0853 12/14/17 0527  WBC 8.1 15.6*  HGB 10.7* 10.6*  HCT 34.5* 33.8*  MCV 104.5* 103.4*  PLT 162 222   Basic Metabolic Panel: Recent Labs  Lab 12/13/17 0853 12/14/17 0527  NA 144 141  K 3.9 5.2*  CL 97* 95*  CO2 30 30  GLUCOSE 156* 195*  BUN 32* 47*  CREATININE 6.60* 8.13*  CALCIUM 9.3 9.2  PHOS  --  5.0*   GFR: Estimated Creatinine Clearance: 8.8 mL/min (A) (by C-G formula based on SCr of 8.13 mg/dL (H)). Liver Function Tests: Recent Labs  Lab 12/14/17 0527  ALBUMIN 3.2*   No results for input(s): LIPASE, AMYLASE in the last 168 hours. No results for input(s): AMMONIA in the last 168 hours. Coagulation Profile: Recent Labs  Lab 12/13/17 0853 12/14/17 0527  INR 1.00 1.04   Cardiac Enzymes: No results for input(s): CKTOTAL, CKMB, CKMBINDEX, TROPONINI in the last 168 hours. BNP (last 3 results) No results for input(s): PROBNP in the last 8760 hours. HbA1C: No results for input(s): HGBA1C in the last 72 hours. CBG: Recent  Labs  Lab 12/08/17 1101 12/13/17 1235  GLUCAP 171* 138*   Lipid Profile: No results for input(s): CHOL, HDL, LDLCALC, TRIG, CHOLHDL, LDLDIRECT in the last 72 hours. Thyroid Function Tests: No results for input(s): TSH, T4TOTAL, FREET4, T3FREE, THYROIDAB in the last 72 hours. Anemia Panel: No results for input(s): VITAMINB12, FOLATE, FERRITIN, TIBC, IRON, RETICCTPCT in the last 72 hours. Sepsis Labs: No results for input(s): PROCALCITON, LATICACIDVEN in the last 168 hours.  No results found for this or any previous visit (from the past 240 hour(s)).    Radiology Studies: Dg C-arm 1-60 Min-no Report  Result Date: 12/13/2017 Fluoroscopy was utilized by the requesting physician.  No radiographic interpretation.     Scheduled Meds: . bromocriptine  5 mg Oral QODAY  . Chlorhexidine Gluconate Cloth  6 each Topical Q0600  . cinacalcet  60 mg Oral QHS  . digoxin  0.0625 mg Oral Daily  . diltiazem  120 mg Oral Daily  . dorzolamide-timolol  1 drop Both Eyes BID  . feeding supplement (NEPRO CARB STEADY)  237 mL Oral Q M,W,F  . gabapentin  200 mg Oral QHS  . Influenza vac split quadrivalent PF  0.5 mL Intramuscular Tomorrow-1000  . loratadine  10 mg Oral Daily  . multivitamin  1 tablet Oral QHS  . pantoprazole  40 mg Oral Daily  . pravastatin  10 mg Oral Daily  . predniSONE  5 mg Oral QHS  . sertraline  50 mg Oral Daily  . sevelamer carbonate  2,400 mg Oral BID WC   Continuous Infusions: . sodium chloride 10 mL/hr at 12/14/17 0600     LOS: 1 day    Time spent: Total of 25 minutes spent with pt, greater than 50% of which was spent in discussion of  treatment, counseling and coordination of care   Chipper Oman, MD Pager: Text Page via [email or web address]   If 7PM-7AM, please contact night-coverage [email or web address] 12/14/2017, 12:34 PM   Note - This record has been created using [employer]. Chart creation errors have been sought, but may not always have been located. Such  creation errors do not reflect on the standard of medical care.

## 2017-12-15 DIAGNOSIS — E789 Disorder of lipoprotein metabolism, unspecified: Secondary | ICD-10-CM | POA: Diagnosis not present

## 2017-12-15 DIAGNOSIS — I10 Essential (primary) hypertension: Secondary | ICD-10-CM | POA: Diagnosis not present

## 2017-12-15 DIAGNOSIS — M47816 Spondylosis without myelopathy or radiculopathy, lumbar region: Secondary | ICD-10-CM | POA: Diagnosis not present

## 2017-12-15 DIAGNOSIS — E119 Type 2 diabetes mellitus without complications: Secondary | ICD-10-CM | POA: Diagnosis not present

## 2017-12-15 DIAGNOSIS — M545 Low back pain: Secondary | ICD-10-CM | POA: Diagnosis not present

## 2017-12-15 DIAGNOSIS — M79604 Pain in right leg: Secondary | ICD-10-CM | POA: Diagnosis not present

## 2017-12-15 NOTE — Discharge Summary (Signed)
Physician Discharge Summary  Patient ID: Alexander [name] MRN: [id number] DOB/AGE: February 15, 1941 77 y.o.  Admit date: 12/13/2017 Discharge date: 12/15/2017  Admission Diagnoses:  Cancer of overlapping sites of bladder [facility])  Discharge Diagnoses:  Principal Problem:   Cancer of overlapping sites of bladder [facility]) Active Problems:   ESRD (end stage renal disease) ([place])   UTI (urinary tract infection)   Past Medical History:  Diagnosis Date  . Anemia   . Aortic stenosis 06/15/12   TEE - EF 50-93%; grade 1 diastolic dysfunction; mild/mod aortic valve stenosis; Mitral valve had calcified annulus, mild pulm htn PA peak pressure 67mmHg  . Barrett's esophagus 05/2003  . Bradycardia 2017   [employer] 2240 Assurity dual-lead pacemaker  . Carpal tunnel syndrome, bilateral 11/03/2015  . Colon polyps   . CVA (cerebral infarction)    2004/affected left side  . Depression   . Diabetes mellitus without complication ([place])   . Diabetic peripheral neuropathy ([place]) 10/02/2015  . Diverticulosis   . Dyspnea    with exertion  . ESRD (end stage renal disease) on dialysis ([place])    "[facility]; [place]; MWF" (05/12/2017)  . Failure to thrive syndrome, adult 10/30/2017  . GERD (gastroesophageal reflux disease)   . Glaucoma   . History of kidney stones   . Hyperlipidemia   . Hypertension   . Legally blind   . Macular degeneration    both eyes  . Orthostatic hypotension 09/09/2015  . Paroxysmal atrial fibrillation ([facility])   . Peptic ulcer    bleeding, 1969  . Presence of permanent cardiac pacemaker   . S/P epidural steroid injection    last  injection over 10 years ago  . Seasonal allergies   . Tubular adenoma of colon 07/2001    Surgeries: Procedure(s): TRANSURETHRAL RESECTION OF BLADDER TUMOR (TURBT) CYSTOSCOPY WITH RETROGRADE PYELOGRAM on 12/13/2017   Consultants (if any): Treatment Team:  Roney Jaffe, MD Garner Gavel, MD Irine Seal, MD  Discharged Condition: Improved  Hospital Course:  Alexander [name] is an 77 y.o. male who was admitted 12/13/2017 with a diagnosis of Cancer of overlapping sites of bladder [facility]) and went to the operating room on 12/13/2017 and underwent the above named procedures.  He was found to have extensive involvement of about 50+% of the bladder wall including the dome, posterior wall, trigone, and lateral walls.  He did well postoperatively and was transferred on the morning of 9/18 to [facility] for dialysis.   He was discharged home following dialysis.   He was given perioperative antibiotics:  Anti-infectives (From admission, onward)   Start     Dose/Rate Route Frequency Ordered Stop   12/13/17 0845  cefTAZidime (FORTAZ) 500 mg in dextrose 5 % 50 mL IVPB     500 mg 100 mL/hr over 30 Minutes Intravenous  Once 12/13/17 0830 12/13/17 1121    .  He was given sequential compression devices for DVT prophylaxis.  He benefited maximally from the hospital stay and there were no complications.    Recent vital signs:  Vitals:   12/14/17 1700 12/14/17 1702  BP: (!) 116/55 (!) 116/57  Pulse: 66 65  Resp:  18  Temp:  98.2 F (36.8 C)  SpO2:  96%    Recent laboratory studies:  Lab Results  Component Value Date   HGB 10.6 (L) 12/14/2017   HGB 10.7 (L) 12/13/2017   HGB 11.1 (L) 11/02/2017   Lab Results  Component Value Date   WBC 15.6 (H) 12/14/2017  PLT 162 12/14/2017   Lab Results  Component Value Date   INR 1.04 12/14/2017   Lab Results  Component Value Date   NA 141 12/14/2017   K 5.2 (H) 12/14/2017   CL 95 (L) 12/14/2017   CO2 30 12/14/2017   BUN 47 (H) 12/14/2017   CREATININE 8.13 (H) 12/14/2017   GLUCOSE 195 (H) 12/14/2017    Discharge Medications:   Allergies as of 12/14/2017      Reactions   Penicillins Swelling, Rash   Has patient had a PCN reaction causing immediate rash, facial/tongue/throat swelling, SOB or lightheadedness with hypotension: Yes Has patient had a PCN reaction causing severe rash involving mucus membranes  or skin necrosis: No Has patient had a PCN reaction that required hospitalization: No Has patient had a PCN reaction occurring within the last 10 years: No If all of the above answers are "NO", then may proceed with Cephalosporin use.   Atorvastatin    MYALGIA   Codeine Nausea Only   Tramadol Nausea Only      Medication List    TAKE these medications   acetaminophen 650 MG CR tablet Commonly known as:  TYLENOL Take 650 mg by mouth every 8 (eight) hours as needed for pain.   b complex-vitamin c-folic acid 0.8 MG Tabs tablet Take 1 tablet by mouth daily.   bromocriptine 5 MG capsule Commonly known as:  PARLODEL Take 5 mg by mouth every other day.   CARDIZEM CD 120 MG 24 hr capsule Generic drug:  diltiazem Take 120 mg by mouth daily.   cetirizine 10 MG tablet Commonly known as:  ZYRTEC Take 10 mg by mouth at bedtime.   cinacalcet 30 MG tablet Commonly known as:  SENSIPAR Take 2 tablets (60 mg total) by mouth 2 (two) times daily with a meal. What changed:  when to take this   DIGOX 0.125 MG tablet Generic drug:  digoxin TAKE 1/2 TABLET BY MOUTH EVERY DAY What changed:  how much to take   dorzolamide-timolol 22.3-6.8 MG/ML ophthalmic solution Commonly known as:  COSOPT Place 1 drop into both eyes 2 (two) times daily.   enoxaparin 80 MG/0.8ML injection Commonly known as:  LOVENOX Inject 0.8 mLs (80 mg total) into the skin daily.   gabapentin 100 MG capsule Commonly known as:  NEURONTIN Take 200 mg by mouth at bedtime.   NEPRO Liqd Take 237 mLs by mouth every Monday, Wednesday, and Friday with hemodialysis.   omeprazole 20 MG capsule Commonly known as:  PRILOSEC Take 20 mg by mouth daily.   pravastatin 10 MG tablet Commonly known as:  PRAVACHOL Take 1 tablet (10 mg total) by mouth daily.   predniSONE 5 MG tablet Commonly known as:  DELTASONE Take 5 mg by mouth at bedtime.   sertraline 50 MG tablet Commonly known as:  ZOLOFT Take 50 mg by mouth daily.    sevelamer carbonate 800 MG tablet Commonly known as:  RENVELA Take 2 tablets (1,600 mg total) by mouth 3 (three) times daily with meals. What changed:    how much to take  when to take this   warfarin 5 MG tablet Commonly known as:  COUMADIN Take as directed. If you are unsure how to take this medication, talk to your nurse or doctor. Original instructions:  Take 1/2 to 1 tablet daily as directed by coumadin clinic What changed:    how much to take  how to take this  when to take this  additional instructions  Diagnostic Studies: Dg C-arm 1-60 Min-no Report  Result Date: 12/13/2017 Fluoroscopy was utilized by the requesting physician.  No radiographic interpretation.    Disposition: Discharge disposition: 01-Home or Self Care         Follow-up Information    Irine Seal, MD On 12/22/2017.   Specialty:  Urology Why:  1:15 Contact information: [address] [place] [id number] [phone]            Signed: Irine Seal 12/15/2017, 12:43 PM

## 2017-12-16 ENCOUNTER — Ambulatory Visit (INDEPENDENT_AMBULATORY_CARE_PROVIDER_SITE_OTHER): Payer: Medicare Other | Admitting: Pharmacist Clinician (PhC)/ Clinical Pharmacy Specialist

## 2017-12-16 DIAGNOSIS — N186 End stage renal disease: Secondary | ICD-10-CM | POA: Diagnosis not present

## 2017-12-16 DIAGNOSIS — I4891 Unspecified atrial fibrillation: Secondary | ICD-10-CM

## 2017-12-16 DIAGNOSIS — D631 Anemia in chronic kidney disease: Secondary | ICD-10-CM | POA: Diagnosis not present

## 2017-12-16 DIAGNOSIS — N2581 Secondary hyperparathyroidism of renal origin: Secondary | ICD-10-CM | POA: Diagnosis not present

## 2017-12-16 DIAGNOSIS — B961 Klebsiella pneumoniae [K. pneumoniae] as the cause of diseases classified elsewhere: Secondary | ICD-10-CM | POA: Diagnosis not present

## 2017-12-16 DIAGNOSIS — Z7901 Long term (current) use of anticoagulants: Secondary | ICD-10-CM | POA: Diagnosis not present

## 2017-12-16 LAB — POCT INR: INR: 1 — AB (ref 2.0–3.0)

## 2017-12-16 NOTE — Patient Instructions (Addendum)
  9/20           : Inject Lovenox in the fatty tissue once daily in the morning and take Coumadin 5 mg.  9/21            : Inject Lovenox in the fatty tissue once daily in the morning and take Coumadin __5 mg__.  9/22            : Inject Lovenox in the fatty tissue once daily in the morning and take Coumadin __5 mg__.  9/23            : Coumadin appt to check INR.

## 2017-12-19 ENCOUNTER — Ambulatory Visit (INDEPENDENT_AMBULATORY_CARE_PROVIDER_SITE_OTHER): Payer: Medicare Other | Admitting: Pharmacist Clinician (PhC)/ Clinical Pharmacy Specialist

## 2017-12-19 DIAGNOSIS — N2581 Secondary hyperparathyroidism of renal origin: Secondary | ICD-10-CM | POA: Diagnosis not present

## 2017-12-19 DIAGNOSIS — D631 Anemia in chronic kidney disease: Secondary | ICD-10-CM | POA: Diagnosis not present

## 2017-12-19 DIAGNOSIS — I4891 Unspecified atrial fibrillation: Secondary | ICD-10-CM

## 2017-12-19 DIAGNOSIS — Z7901 Long term (current) use of anticoagulants: Secondary | ICD-10-CM

## 2017-12-19 DIAGNOSIS — B961 Klebsiella pneumoniae [K. pneumoniae] as the cause of diseases classified elsewhere: Secondary | ICD-10-CM | POA: Diagnosis not present

## 2017-12-19 DIAGNOSIS — N186 End stage renal disease: Secondary | ICD-10-CM | POA: Diagnosis not present

## 2017-12-19 LAB — POCT INR: INR: 1.4 — AB (ref 2.0–3.0)

## 2017-12-20 DIAGNOSIS — N186 End stage renal disease: Secondary | ICD-10-CM | POA: Diagnosis not present

## 2017-12-20 DIAGNOSIS — Z8679 Personal history of other diseases of the circulatory system: Secondary | ICD-10-CM | POA: Diagnosis not present

## 2017-12-20 DIAGNOSIS — I10 Essential (primary) hypertension: Secondary | ICD-10-CM | POA: Diagnosis not present

## 2017-12-20 DIAGNOSIS — E1121 Type 2 diabetes mellitus with diabetic nephropathy: Secondary | ICD-10-CM | POA: Diagnosis not present

## 2017-12-20 DIAGNOSIS — N62 Hypertrophy of breast: Secondary | ICD-10-CM | POA: Diagnosis not present

## 2017-12-21 DIAGNOSIS — D631 Anemia in chronic kidney disease: Secondary | ICD-10-CM | POA: Diagnosis not present

## 2017-12-21 DIAGNOSIS — B961 Klebsiella pneumoniae [K. pneumoniae] as the cause of diseases classified elsewhere: Secondary | ICD-10-CM | POA: Diagnosis not present

## 2017-12-21 DIAGNOSIS — N2581 Secondary hyperparathyroidism of renal origin: Secondary | ICD-10-CM | POA: Diagnosis not present

## 2017-12-21 DIAGNOSIS — N186 End stage renal disease: Secondary | ICD-10-CM | POA: Diagnosis not present

## 2017-12-22 DIAGNOSIS — M154 Erosive (osteo)arthritis: Secondary | ICD-10-CM | POA: Diagnosis not present

## 2017-12-22 DIAGNOSIS — E119 Type 2 diabetes mellitus without complications: Secondary | ICD-10-CM | POA: Diagnosis not present

## 2017-12-22 DIAGNOSIS — C678 Malignant neoplasm of overlapping sites of bladder: Secondary | ICD-10-CM | POA: Diagnosis not present

## 2017-12-22 DIAGNOSIS — N186 End stage renal disease: Secondary | ICD-10-CM | POA: Diagnosis not present

## 2017-12-22 DIAGNOSIS — Z992 Dependence on renal dialysis: Secondary | ICD-10-CM | POA: Diagnosis not present

## 2017-12-22 DIAGNOSIS — I10 Essential (primary) hypertension: Secondary | ICD-10-CM | POA: Diagnosis not present

## 2017-12-22 DIAGNOSIS — H9190 Unspecified hearing loss, unspecified ear: Secondary | ICD-10-CM | POA: Diagnosis not present

## 2017-12-22 DIAGNOSIS — E789 Disorder of lipoprotein metabolism, unspecified: Secondary | ICD-10-CM | POA: Diagnosis not present

## 2017-12-22 DIAGNOSIS — D72829 Elevated white blood cell count, unspecified: Secondary | ICD-10-CM | POA: Diagnosis not present

## 2017-12-22 DIAGNOSIS — D638 Anemia in other chronic diseases classified elsewhere: Secondary | ICD-10-CM | POA: Diagnosis not present

## 2017-12-22 DIAGNOSIS — I482 Chronic atrial fibrillation: Secondary | ICD-10-CM | POA: Diagnosis not present

## 2017-12-23 DIAGNOSIS — D631 Anemia in chronic kidney disease: Secondary | ICD-10-CM | POA: Diagnosis not present

## 2017-12-23 DIAGNOSIS — B961 Klebsiella pneumoniae [K. pneumoniae] as the cause of diseases classified elsewhere: Secondary | ICD-10-CM | POA: Diagnosis not present

## 2017-12-23 DIAGNOSIS — N2581 Secondary hyperparathyroidism of renal origin: Secondary | ICD-10-CM | POA: Diagnosis not present

## 2017-12-23 DIAGNOSIS — N186 End stage renal disease: Secondary | ICD-10-CM | POA: Diagnosis not present

## 2017-12-26 ENCOUNTER — Ambulatory Visit (INDEPENDENT_AMBULATORY_CARE_PROVIDER_SITE_OTHER): Payer: Medicare Other | Admitting: Pharmacist Clinician (PhC)/ Clinical Pharmacy Specialist

## 2017-12-26 DIAGNOSIS — D631 Anemia in chronic kidney disease: Secondary | ICD-10-CM | POA: Diagnosis not present

## 2017-12-26 DIAGNOSIS — M961 Postlaminectomy syndrome, not elsewhere classified: Secondary | ICD-10-CM | POA: Diagnosis not present

## 2017-12-26 DIAGNOSIS — I4891 Unspecified atrial fibrillation: Secondary | ICD-10-CM | POA: Diagnosis not present

## 2017-12-26 DIAGNOSIS — Z7901 Long term (current) use of anticoagulants: Secondary | ICD-10-CM

## 2017-12-26 DIAGNOSIS — M47816 Spondylosis without myelopathy or radiculopathy, lumbar region: Secondary | ICD-10-CM | POA: Diagnosis not present

## 2017-12-26 DIAGNOSIS — B961 Klebsiella pneumoniae [K. pneumoniae] as the cause of diseases classified elsewhere: Secondary | ICD-10-CM | POA: Diagnosis not present

## 2017-12-26 DIAGNOSIS — N186 End stage renal disease: Secondary | ICD-10-CM | POA: Diagnosis not present

## 2017-12-26 DIAGNOSIS — N2581 Secondary hyperparathyroidism of renal origin: Secondary | ICD-10-CM | POA: Diagnosis not present

## 2017-12-26 LAB — POCT INR: INR: 1.8 — AB (ref 2.0–3.0)

## 2017-12-26 NOTE — Patient Instructions (Signed)
Description   Take 1 tablet today, then continue with 1 tablet daily except 1/2 tablet each Monday, Wednesday and Friday.  Repeat INR in 2 week.

## 2017-12-27 DIAGNOSIS — Z992 Dependence on renal dialysis: Secondary | ICD-10-CM | POA: Diagnosis not present

## 2017-12-27 DIAGNOSIS — N2581 Secondary hyperparathyroidism of renal origin: Secondary | ICD-10-CM | POA: Diagnosis not present

## 2017-12-27 DIAGNOSIS — N186 End stage renal disease: Secondary | ICD-10-CM | POA: Diagnosis not present

## 2017-12-28 DIAGNOSIS — D631 Anemia in chronic kidney disease: Secondary | ICD-10-CM | POA: Diagnosis not present

## 2017-12-28 DIAGNOSIS — Z23 Encounter for immunization: Secondary | ICD-10-CM | POA: Diagnosis not present

## 2017-12-28 DIAGNOSIS — N186 End stage renal disease: Secondary | ICD-10-CM | POA: Diagnosis not present

## 2017-12-28 DIAGNOSIS — E1129 Type 2 diabetes mellitus with other diabetic kidney complication: Secondary | ICD-10-CM | POA: Diagnosis not present

## 2017-12-28 DIAGNOSIS — N2581 Secondary hyperparathyroidism of renal origin: Secondary | ICD-10-CM | POA: Diagnosis not present

## 2017-12-30 ENCOUNTER — Telehealth: Payer: Self-pay | Admitting: *Deleted

## 2017-12-30 DIAGNOSIS — N186 End stage renal disease: Secondary | ICD-10-CM | POA: Diagnosis not present

## 2017-12-30 DIAGNOSIS — N2581 Secondary hyperparathyroidism of renal origin: Secondary | ICD-10-CM | POA: Diagnosis not present

## 2017-12-30 DIAGNOSIS — Z23 Encounter for immunization: Secondary | ICD-10-CM | POA: Diagnosis not present

## 2017-12-30 DIAGNOSIS — D631 Anemia in chronic kidney disease: Secondary | ICD-10-CM | POA: Diagnosis not present

## 2017-12-30 NOTE — Telephone Encounter (Signed)
Dr. Fuller Plan just for your FYI pt. Had TURP ON 12/13/17.

## 2018-01-02 ENCOUNTER — Telehealth: Payer: Self-pay | Admitting: Cardiovascular Disease

## 2018-01-02 ENCOUNTER — Other Ambulatory Visit: Payer: Self-pay

## 2018-01-02 DIAGNOSIS — R310 Gross hematuria: Secondary | ICD-10-CM | POA: Diagnosis not present

## 2018-01-02 DIAGNOSIS — Z23 Encounter for immunization: Secondary | ICD-10-CM | POA: Diagnosis not present

## 2018-01-02 DIAGNOSIS — R9389 Abnormal findings on diagnostic imaging of other specified body structures: Secondary | ICD-10-CM

## 2018-01-02 DIAGNOSIS — N2581 Secondary hyperparathyroidism of renal origin: Secondary | ICD-10-CM | POA: Diagnosis not present

## 2018-01-02 DIAGNOSIS — N281 Cyst of kidney, acquired: Secondary | ICD-10-CM | POA: Diagnosis not present

## 2018-01-02 DIAGNOSIS — N186 End stage renal disease: Secondary | ICD-10-CM | POA: Diagnosis not present

## 2018-01-02 DIAGNOSIS — Z86010 Personal history of colonic polyps: Secondary | ICD-10-CM

## 2018-01-02 DIAGNOSIS — C678 Malignant neoplasm of overlapping sites of bladder: Secondary | ICD-10-CM | POA: Diagnosis not present

## 2018-01-02 DIAGNOSIS — D631 Anemia in chronic kidney disease: Secondary | ICD-10-CM | POA: Diagnosis not present

## 2018-01-02 NOTE — Telephone Encounter (Signed)
Please address coumadin and pre med with ABX with hx TAVR and hx of sepsis

## 2018-01-02 NOTE — Telephone Encounter (Signed)
Pt takes warfarin for afib with CHADS2VASc score of 7 (age x2, HTN, CAD/PAD, CVA, DM). Also with history of TAVR and is ESRD on HD.   Procedure will need to be moved - pt will need to hold warfarin for 5 days prior to procedure (clearance request sent 3 days prior) and pt will also require Lovenox bridge to be coordinated in Coumadin clinic.  He will not require pre op antibiotics for colonoscopy.

## 2018-01-02 NOTE — Telephone Encounter (Signed)
   [facility] Pre-operative Risk Assessment    Request for surgical clearance:  1. What type of surgery is being performed? colonoscopy  2. When is this surgery scheduled? 01/05/18  What type of clearance is required (medical clearance vs. Pharmacy clearance to hold med vs. Both)?medical  3. Are there any medications that need to be held prior to surgery and how long? Warfarin  4. Practice name and name of physician performing surgery?Dr. Fuller Plan  5. What is your office phone number [phone]   7.   What is your office fax number [phone]  8.   Anesthesia type (None, local, MAC, general) ? MAC  Surgery got moved to 01/05/18 and need to know if it's okay that he continues to hold Warfarin and the bridging on the Coumadin   Gwendel Hanson 01/02/2018, 8:16 AM  _________________________________________________________________   (provider comments below)

## 2018-01-03 ENCOUNTER — Telehealth: Payer: Self-pay | Admitting: Gastroenterology

## 2018-01-03 ENCOUNTER — Encounter: Payer: Medicare Other | Admitting: Gastroenterology

## 2018-01-03 ENCOUNTER — Other Ambulatory Visit: Payer: Self-pay | Admitting: Urology

## 2018-01-03 NOTE — Telephone Encounter (Signed)
As long as he is being bridged with Lovenox, yes. Will also require bridge after he is discharged.

## 2018-01-03 NOTE — Telephone Encounter (Signed)
Already working with daughter and GI.   1. Patient instructed to HOLD warfarin  2. Coumadin appt  Scheduled for 01/04/18 for further instruction   3. Will forward INR result and recommendations to GI (att Estill Bamberg) and Dr Kennedy Bucker

## 2018-01-03 NOTE — Telephone Encounter (Signed)
See phone note from 01/02/18

## 2018-01-03 NOTE — Telephone Encounter (Signed)
I asked pt's daughter to call Cyril Mourning at the [facility]  To arrange the crossover.

## 2018-01-03 NOTE — Telephone Encounter (Signed)
He was seen in the [facility] office last week and it does not look like his procedure date was set yet or pt did not make the office aware since Lovenox was not discussed at that visit. I have routed the message to our [facility] office pharmacists who manage patient's Coumadin and will help with Lovenox bridging after the procedure.

## 2018-01-03 NOTE — Telephone Encounter (Signed)
According to the phone note from 11/17/17 patient is being bridged by your office this week already. I am assuming he is still being bridged. Our office did not coordinate this. Can patient proceed with procedure Thursday?

## 2018-01-03 NOTE — Telephone Encounter (Signed)
Please see previous phone note from 11/17/17. Patient has been rescheduled to the hospital. Patient has been recently diagnosed with bladder cancer and needs to have the Colonscopy this week.  He recently had a TURP during which anesthesia supported his blood pressure with a phenylepherine infusion throughout the procedure. Although he does not fail any of our specific guidelines it would be best to perform his scheduled procedure at the hospital.  Patient is already off of coumadin and has been bridged with Lovenox. Can patient continue to be off coumadin?

## 2018-01-04 ENCOUNTER — Other Ambulatory Visit: Payer: Self-pay | Admitting: Pharmacist Clinician (PhC)/ Clinical Pharmacy Specialist

## 2018-01-04 ENCOUNTER — Ambulatory Visit (INDEPENDENT_AMBULATORY_CARE_PROVIDER_SITE_OTHER): Payer: Medicare Other | Admitting: Pharmacist Clinician (PhC)/ Clinical Pharmacy Specialist

## 2018-01-04 ENCOUNTER — Encounter (HOSPITAL_COMMUNITY): Payer: Self-pay | Admitting: *Deleted

## 2018-01-04 ENCOUNTER — Other Ambulatory Visit: Payer: Self-pay

## 2018-01-04 DIAGNOSIS — Z23 Encounter for immunization: Secondary | ICD-10-CM | POA: Diagnosis not present

## 2018-01-04 DIAGNOSIS — I4891 Unspecified atrial fibrillation: Secondary | ICD-10-CM

## 2018-01-04 DIAGNOSIS — D631 Anemia in chronic kidney disease: Secondary | ICD-10-CM | POA: Diagnosis not present

## 2018-01-04 DIAGNOSIS — N2581 Secondary hyperparathyroidism of renal origin: Secondary | ICD-10-CM | POA: Diagnosis not present

## 2018-01-04 DIAGNOSIS — Z7901 Long term (current) use of anticoagulants: Secondary | ICD-10-CM | POA: Diagnosis not present

## 2018-01-04 DIAGNOSIS — N186 End stage renal disease: Secondary | ICD-10-CM | POA: Diagnosis not present

## 2018-01-04 LAB — POCT INR: INR: 1.5 — AB (ref 2.0–3.0)

## 2018-01-04 MED ORDER — ENOXAPARIN SODIUM 80 MG/0.8ML ~~LOC~~ SOLN: 80.0000 mg | SUBCUTANEOUS | 0 refills | Status: DC

## 2018-01-04 NOTE — Telephone Encounter (Signed)
OK to proceed with colonoscopy as planned tomorrow.  Continue to hold Coumadin for colonoscopy tomorrow.  Defer decision on Lovenox bridge and any additional anticoagulation needs Cecilie Kicks, NP

## 2018-01-04 NOTE — Progress Notes (Signed)
Pre-op call completed by pt daughter, [name] [facility]). Daughter made aware to stop administering vitamins, fish oil and herbal medications. Do not take any NSAIDs ie: Ibuprofen, Advil, Naproxen (aleve), Motrin, BC and Goody Powder. Daughter stated that pt does not check blood glucose. Daughter verbalized understanding of all pre-op instructions. Peri-op prescription for ICD faxed.

## 2018-01-04 NOTE — Telephone Encounter (Signed)
INR in office today 1.5.  Patient will hold warfarin again today, restart Thursday or Friday evening at discretion of Dr. Fuller Plan.  Will have him start enoxaparin once daily on Friday, x 3 days and repeat INR Monday.    Surgery scheduled for Nov 22, will bridge him prior to that.

## 2018-01-04 NOTE — Progress Notes (Signed)
   01/04/18 1859  OBSTRUCTIVE SLEEP APNEA  Have you ever been diagnosed with sleep apnea through a sleep study? No  Do you snore loudly (loud enough to be heard through closed doors)?  1  Do you often feel tired, fatigued, or sleepy during the daytime (such as falling asleep during driving or talking to someone)? 0  Has anyone observed you stop breathing during your sleep? 0  Do you have, or are you being treated for high blood pressure? 1  BMI more than 35 kg/m2? 0  Age > 50 (1-yes) 1  Neck circumference greater than:Male 16 inches or larger, Male 17inches or larger? 0  Male Gender (Yes=1) 1  Obstructive Sleep Apnea Score 4

## 2018-01-04 NOTE — Patient Instructions (Signed)
Description   Resume warfarin Thursday evening after colonoscopy unless told otherwise by GI MD.  Take 1.5 tablets on Friday Oct 11, but otherwise continue normal dosing.  Do injection of enoxaparin on Friday morning (Oct 11), Saturday morning (Oct 12) and Sunday morning (Oct 13)

## 2018-01-05 ENCOUNTER — Ambulatory Visit (HOSPITAL_COMMUNITY): Payer: Medicare Other | Admitting: Anesthesiology

## 2018-01-05 ENCOUNTER — Encounter (HOSPITAL_COMMUNITY): Payer: Self-pay | Admitting: *Deleted

## 2018-01-05 ENCOUNTER — Ambulatory Visit (HOSPITAL_COMMUNITY)
Admission: RE | Admit: 2018-01-05 | Discharge: 2018-01-05 | Disposition: A | Discharge status: Home / Self Care | Payer: Medicare Other | Source: Ambulatory Visit | Attending: Gastroenterology | Admitting: Gastroenterology

## 2018-01-05 ENCOUNTER — Encounter (HOSPITAL_COMMUNITY)
Admission: RE | Disposition: A | Discharge status: Home / Self Care | Payer: Self-pay | Source: Ambulatory Visit | Attending: Gastroenterology

## 2018-01-05 DIAGNOSIS — Z7901 Long term (current) use of anticoagulants: Secondary | ICD-10-CM | POA: Diagnosis not present

## 2018-01-05 DIAGNOSIS — K921 Melena: Secondary | ICD-10-CM | POA: Insufficient documentation

## 2018-01-05 DIAGNOSIS — J90 Pleural effusion, not elsewhere classified: Secondary | ICD-10-CM | POA: Insufficient documentation

## 2018-01-05 DIAGNOSIS — H548 Legal blindness, as defined in USA: Secondary | ICD-10-CM | POA: Diagnosis not present

## 2018-01-05 DIAGNOSIS — I69354 Hemiplegia and hemiparesis following cerebral infarction affecting left non-dominant side: Secondary | ICD-10-CM | POA: Diagnosis not present

## 2018-01-05 DIAGNOSIS — D123 Benign neoplasm of transverse colon: Secondary | ICD-10-CM

## 2018-01-05 DIAGNOSIS — K2270 Barrett's esophagus without dysplasia: Secondary | ICD-10-CM | POA: Diagnosis not present

## 2018-01-05 DIAGNOSIS — Z860101 Personal history of adenomatous and serrated colon polyps: Secondary | ICD-10-CM

## 2018-01-05 DIAGNOSIS — N186 End stage renal disease: Secondary | ICD-10-CM | POA: Insufficient documentation

## 2018-01-05 DIAGNOSIS — K219 Gastro-esophageal reflux disease without esophagitis: Secondary | ICD-10-CM | POA: Diagnosis not present

## 2018-01-05 DIAGNOSIS — I132 Hypertensive heart and chronic kidney disease with heart failure and with stage 5 chronic kidney disease, or end stage renal disease: Secondary | ICD-10-CM | POA: Insufficient documentation

## 2018-01-05 DIAGNOSIS — E1142 Type 2 diabetes mellitus with diabetic polyneuropathy: Secondary | ICD-10-CM | POA: Insufficient documentation

## 2018-01-05 DIAGNOSIS — R9389 Abnormal findings on diagnostic imaging of other specified body structures: Secondary | ICD-10-CM

## 2018-01-05 DIAGNOSIS — Z8711 Personal history of peptic ulcer disease: Secondary | ICD-10-CM | POA: Insufficient documentation

## 2018-01-05 DIAGNOSIS — Z87891 Personal history of nicotine dependence: Secondary | ICD-10-CM | POA: Insufficient documentation

## 2018-01-05 DIAGNOSIS — I700 Atherosclerosis of aorta: Secondary | ICD-10-CM | POA: Insufficient documentation

## 2018-01-05 DIAGNOSIS — D120 Benign neoplasm of cecum: Secondary | ICD-10-CM

## 2018-01-05 DIAGNOSIS — K649 Unspecified hemorrhoids: Secondary | ICD-10-CM | POA: Diagnosis not present

## 2018-01-05 DIAGNOSIS — D124 Benign neoplasm of descending colon: Secondary | ICD-10-CM

## 2018-01-05 DIAGNOSIS — K5730 Diverticulosis of large intestine without perforation or abscess without bleeding: Secondary | ICD-10-CM | POA: Diagnosis not present

## 2018-01-05 DIAGNOSIS — I2510 Atherosclerotic heart disease of native coronary artery without angina pectoris: Secondary | ICD-10-CM | POA: Diagnosis not present

## 2018-01-05 DIAGNOSIS — Z888 Allergy status to other drugs, medicaments and biological substances status: Secondary | ICD-10-CM | POA: Insufficient documentation

## 2018-01-05 DIAGNOSIS — K640 First degree hemorrhoids: Secondary | ICD-10-CM | POA: Diagnosis not present

## 2018-01-05 DIAGNOSIS — Z86010 Personal history of colonic polyps: Secondary | ICD-10-CM | POA: Insufficient documentation

## 2018-01-05 DIAGNOSIS — Z8249 Family history of ischemic heart disease and other diseases of the circulatory system: Secondary | ICD-10-CM | POA: Insufficient documentation

## 2018-01-05 DIAGNOSIS — Z885 Allergy status to narcotic agent status: Secondary | ICD-10-CM | POA: Insufficient documentation

## 2018-01-05 DIAGNOSIS — R933 Abnormal findings on diagnostic imaging of other parts of digestive tract: Secondary | ICD-10-CM

## 2018-01-05 DIAGNOSIS — K5900 Constipation, unspecified: Secondary | ICD-10-CM | POA: Diagnosis not present

## 2018-01-05 DIAGNOSIS — K648 Other hemorrhoids: Secondary | ICD-10-CM

## 2018-01-05 DIAGNOSIS — I509 Heart failure, unspecified: Secondary | ICD-10-CM | POA: Insufficient documentation

## 2018-01-05 DIAGNOSIS — Z950 Presence of cardiac pacemaker: Secondary | ICD-10-CM | POA: Insufficient documentation

## 2018-01-05 DIAGNOSIS — I480 Paroxysmal atrial fibrillation: Secondary | ICD-10-CM | POA: Insufficient documentation

## 2018-01-05 DIAGNOSIS — E1122 Type 2 diabetes mellitus with diabetic chronic kidney disease: Secondary | ICD-10-CM | POA: Insufficient documentation

## 2018-01-05 DIAGNOSIS — Z992 Dependence on renal dialysis: Secondary | ICD-10-CM | POA: Insufficient documentation

## 2018-01-05 DIAGNOSIS — D122 Benign neoplasm of ascending colon: Secondary | ICD-10-CM | POA: Diagnosis not present

## 2018-01-05 DIAGNOSIS — Z880 Allergy status to penicillin: Secondary | ICD-10-CM | POA: Insufficient documentation

## 2018-01-05 DIAGNOSIS — Z952 Presence of prosthetic heart valve: Secondary | ICD-10-CM | POA: Insufficient documentation

## 2018-01-05 DIAGNOSIS — Z79899 Other long term (current) drug therapy: Secondary | ICD-10-CM | POA: Insufficient documentation

## 2018-01-05 HISTORY — DX: Atherosclerotic heart disease of native coronary artery without angina pectoris: I25.10

## 2018-01-05 HISTORY — DX: Heart failure, unspecified: I50.9

## 2018-01-05 HISTORY — PX: POLYPECTOMY: SHX5525

## 2018-01-05 HISTORY — PX: COLONOSCOPY WITH PROPOFOL: SHX5780

## 2018-01-05 HISTORY — DX: Other specified postprocedural states: R11.2

## 2018-01-05 HISTORY — DX: Other specified postprocedural states: Z98.890

## 2018-01-05 HISTORY — DX: Malignant (primary) neoplasm, unspecified: C80.1

## 2018-01-05 LAB — POCT I-STAT 4, (NA,K, GLUC, HGB,HCT)
Glucose, Bld: 160 mg/dL — ABNORMAL HIGH (ref 70–99)
HCT: 32 % — ABNORMAL LOW (ref 39.0–52.0)
HEMOGLOBIN: 10.9 g/dL — AB (ref 13.0–17.0)
Potassium: 4.5 mmol/L (ref 3.5–5.1)
Sodium: 141 mmol/L (ref 135–145)

## 2018-01-05 SURGERY — COLONOSCOPY WITH PROPOFOL
Anesthesia: Monitor Anesthesia Care

## 2018-01-05 MED ORDER — PROPOFOL 10 MG/ML IV BOLUS
INTRAVENOUS | Status: DC | PRN
  Administered 2018-01-05: 20 mg via INTRAVENOUS

## 2018-01-05 MED ORDER — PHENYLEPHRINE HCL 10 MG/ML IJ SOLN
INTRAMUSCULAR | Status: DC | PRN
  Administered 2018-01-05 (×3): 80 ug via INTRAVENOUS

## 2018-01-05 MED ORDER — LIDOCAINE HCL (CARDIAC) PF 100 MG/5ML IV SOSY
PREFILLED_SYRINGE | INTRAVENOUS | Status: DC | PRN
  Administered 2018-01-05: 40 mg via INTRAVENOUS

## 2018-01-05 MED ORDER — SODIUM CHLORIDE 0.9 % IV SOLN
INTRAVENOUS | Status: DC
  Administered 2018-01-05: 500 mL via INTRAVENOUS

## 2018-01-05 MED ORDER — PROPOFOL 500 MG/50ML IV EMUL
INTRAVENOUS | Status: DC | PRN
  Administered 2018-01-05: 75 ug/kg/min via INTRAVENOUS

## 2018-01-05 SURGICAL SUPPLY — 21 items

## 2018-01-05 NOTE — H&P (Signed)
History of Present Illness: This is a 77 year old male referred by Alcide Evener, MD for the evaluation of an abnormal CT of the rectum. He is accompanied by his wife and daughter.  He has had intermittent problems with small amounts of hematochezia associated with bowel movements.  CT scan as below.  Status post TAVR last summer. He is legally blind.  He has mild constipation that is adequately managed with daily prunes.  He did not return for recommended surveillance colonoscopy in December 2017. Denies weight loss, abdominal pain, diarrhea, change in stool caliber, melena, nausea, vomiting, dysphagia, reflux symptoms, chest pain.  Abd/pelvic CT 10/2017 1. Suggestion of slight rectal prolapse with prominence of the soft tissues at the anus. This could be due to hemorrhoids but I can't exclude a mass. 2. Small enhancing lesion in the right posterolateral aspect of the bladder as well as slight enhancement of the mucosa of the dome of the bladder. Possibility of bladder neoplasm should be considered. 3. Extensive diverticulosis of the sigmoid portion of the colon without discrete diverticulitis. 4. Aortic Atherosclerosis (ICD10-I70.0). 5. Small nonspecific left pleural effusion, minimally increased since 2018.        Allergies  Allergen Reactions  . Penicillins Swelling and Rash    Has patient had a PCN reaction causing immediate rash, facial/tongue/throat swelling, SOB or lightheadedness with hypotension: Yes Has patient had a PCN reaction causing severe rash involving mucus membranes or skin necrosis: No Has patient had a PCN reaction that required hospitalization: No Has patient had a PCN reaction occurring within the last 10 years: No If all of the above answers are "NO", then may proceed with Cephalosporin use.   . Atorvastatin     MYALGIA   . Codeine Nausea Only  . Tramadol Nausea Only         Outpatient Medications Prior to Visit  Medication Sig Dispense Refill  .  acetaminophen (TYLENOL) 325 MG tablet Take 975 mg by mouth daily as needed for headache.     . b complex-vitamin c-folic acid (NEPHRO-VITE) 0.8 MG TABS Take 1 tablet by mouth daily.     . bromocriptine (PARLODEL) 5 MG capsule Take 5 mg by mouth See admin instructions. Monday, Wednesday and Friday only    . cetirizine (ZYRTEC) 10 MG tablet Take 10 mg by mouth at bedtime.     . cinacalcet (SENSIPAR) 30 MG tablet Take 2 tablets (60 mg total) by mouth 2 (two) times daily with a meal. 60 tablet 0  . DIGOX 125 MCG tablet TAKE 1/2 TABLET BY MOUTH EVERY DAY 15 tablet 4  . diltiazem (CARDIZEM CD) 120 MG 24 hr capsule Take 120 mg by mouth daily.    . dorzolamide-timolol (COSOPT) 22.3-6.8 MG/ML ophthalmic solution Place 1 drop into both eyes 2 (two) times daily.     [name] gabapentin (NEURONTIN) 100 MG capsule Take 100 mg by mouth 2 (two) times daily.    . Nutritional Supplements (NEPRO) LIQD Take 237 mLs by mouth every Monday, Wednesday, and Friday with hemodialysis.     [name] omeprazole (PRILOSEC) 20 MG capsule Take 20 mg by mouth daily.    . pravastatin (PRAVACHOL) 10 MG tablet Take 1 tablet (10 mg total) by mouth daily. 30 tablet 0  . predniSONE (DELTASONE) 5 MG tablet Take 5 mg by mouth at bedtime.   1  . sertraline (ZOLOFT) 50 MG tablet Take 50 mg by mouth daily.  5  . sevelamer carbonate (RENVELA) 800 MG tablet Take 2  tablets (1,600 mg total) by mouth 3 (three) times daily with meals. 90 tablet 0  . Tiotropium Bromide-Olodaterol (STIOLTO RESPIMAT) 2.5-2.5 MCG/ACT AERS Inhale 2 puffs into the lungs daily. 1 Inhaler 5  . warfarin (COUMADIN) 5 MG tablet Take 1/2 to 1 tablet daily as directed by coumadin clinic 90 tablet 0  . polyethylene glycol (MIRALAX / GLYCOLAX) packet Take 17 g by mouth daily. 14 each 0   No facility-administered medications prior to visit.        Past Medical History:  Diagnosis Date  . Anemia   . Aortic stenosis 06/15/12   TEE - EF 63-78%; grade 1 diastolic  dysfunction; mild/mod aortic valve stenosis; Mitral valve had calcified annulus, mild pulm htn PA peak pressure 94mmHg  . Barrett's esophagus 05/2003  . Bradycardia 2017   [employer] 2240 Assurity dual-lead pacemaker  . Carpal tunnel syndrome, bilateral 11/03/2015  . Colon polyps   . CVA (cerebral infarction)    2004/affected left side  . Depression   . Diabetes mellitus without complication ([place])   . Diabetic peripheral neuropathy ([place]) 10/02/2015  . Diverticulosis   . Dyspnea    with exertion  . ESRD (end stage renal disease) on dialysis ([place])    "[facility]; [place]; MWF" (05/12/2017)  . Failure to thrive syndrome, adult 10/30/2017  . GERD (gastroesophageal reflux disease)   . Glaucoma   . History of kidney stones   . Hyperlipidemia   . Hypertension   . Legally blind   . Macular degeneration    both eyes  . Orthostatic hypotension 09/09/2015  . Paroxysmal atrial fibrillation ([facility])   . Peptic ulcer    bleeding, 1969  . Presence of permanent cardiac pacemaker   . S/P epidural steroid injection    last  injection over 10 years ago  . Seasonal allergies   . Tubular adenoma of colon 07/2001        Past Surgical History:  Procedure Laterality Date  . AV FISTULA PLACEMENT  2009  . BACK SURGERY    . CARDIOVERSION N/A 11/13/2015   Procedure: CARDIOVERSION;  Surgeon: Troy Sine, MD;  Location[facility] ENDOSCOPY;  Service: Cardiovascular;  Laterality: N/A;  . CARDIOVERSION N/A 01/13/2016   Procedure: CARDIOVERSION;  Surgeon: Will Meredith [name], MD;  Location: [place];  Service: Cardiovascular;  Laterality: N/A;  . [place]   right eye  . CYSTOSCOPY  several times   kidney stones  . EP IMPLANTABLE DEVICE N/A 03/11/2015   Procedure: Pacemaker Implant;  Surgeon: Will Meredith [name], MD;  [place];  Laterality: Left  . LAMINECTOMY  1969  . RIGHT/LEFT HEART CATH AND CORONARY ANGIOGRAPHY N/A 08/20/2016    Procedure: Right/Left Heart Cath and Coronary Angiography;  Surgeon: Burnell Blanks, MD;  Location: [place] CV LAB;  Service: Cardiovascular;  Laterality: N/A;  . TEE WITHOUT CARDIOVERSION N/A 07/22/2016   Procedure: TRANSESOPHAGEAL ECHOCARDIOGRAM (TEE);  Surgeon: Skeet Latch, MD;  Location: [place];  Service: Cardiovascular;  Laterality: N/A;  . TEE WITHOUT CARDIOVERSION N/A 08/31/2016   Procedure: TRANSESOPHAGEAL ECHOCARDIOGRAM (TEE);  Surgeon: Burnell Blanks, MD;  Location: [place];  Service: Open Heart Surgery;  Laterality: N/A;  . TEE WITHOUT CARDIOVERSION N/A 12/01/2016   Procedure: TRANSESOPHAGEAL ECHOCARDIOGRAM (TEE);  Surgeon: Larey Dresser, MD;  Location: [facility] ENDOSCOPY;  Service: Cardiovascular;  Laterality: N/A;  . TONSILLECTOMY  1964  . TRANSCATHETER AORTIC VALVE REPLACEMENT, TRANSFEMORAL N/A 08/31/2016   Procedure: TRANSCATHETER AORTIC VALVE REPLACEMENT, TRANSFEMORAL;  Surgeon: Burnell Blanks, MD;  Location: [place];  Service: Open Heart Surgery;  Laterality: N/A;   Social History        Socioeconomic History  . Marital status: Married    Spouse name: Not on file  . Number of children: 3  . Years of education: 30  . Highest education level: Not on file  Occupational History  . 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 Financial resource strain: Not on file  . Food insecurity:    Worry: Not on file    Inability: Not on file  . Transportation needs:    Medical: Not on file    Non-medical: Not on file  Tobacco Use  . Smoking status: Former Smoker    Packs/day: 2.00    Years: 45.00    Pack years: 90.00    Types: Cigarettes    Last attempt to quit: 01/20/1998    Years since quitting: 19.8  . Smokeless tobacco: Never Used  Substance and Sexual Activity  . Alcohol use: No    Alcohol/week: 0.0 standard drinks  . Drug use: No  . Sexual activity: Yes  Lifestyle  . Physical  activity:    Days per week: Not on file    Minutes per session: Not on file  . Stress: Not on file  Relationships  . Social connections:    Talks on phone: Not on file    Gets together: Not on file    Attends religious service: Not on file    Active member of club or organization: Not on file    Attends meetings of clubs or organizations: Not on file    Relationship status: Not on file  Other Topics Concern  . Not on file  Social History Narrative   Lives at home w/ his wife   Right-handed   Drinks 1 cup of coffee per day        Family History  Problem Relation Age of Onset  . Stomach cancer Mother   . Hypertension Father        Died of heart attack  . Heart attack Father   . Stroke Sister   . Heart disease Sister   . Cancer Brother   . Colon cancer Neg Hx       Review of Systems: Pertinent positive and negative review of systems were noted in the above HPI section. All other review of systems were otherwise negative.   Physical Exam: General: Well developed, well nourished, no acute distress Head: Normocephalic and atraumatic Eyes:  sclerae anicteric, EOMI Ears: Normal auditory acuity Mouth: No deformity or lesions Neck: Supple, no masses or thyromegaly Lungs: Clear throughout to auscultation Heart: Regular rate and rhythm; systolic murmur, no rubs or bruits Abdomen: Soft, non tender and non distended. No masses, hepatosplenomegaly or hernias noted. Normal Bowel sounds Rectal: Deferred to colonoscopy Musculoskeletal: Symmetrical with no gross deformities  Skin: No lesions on visible extremities Pulses:  Normal pulses noted Extremities: No clubbing, cyanosis, edema or deformities noted Neurological: Alert oriented x 4, grossly nonfocal Cervical Nodes:  No significant cervical adenopathy Inguinal Nodes: No significant inguinal adenopathy Psychological:  Alert and cooperative. Normal mood and affect  Assessment and  Recommendations:   1. Abnormal CT of rectum, hematochezia, personal history of adenomatous colon polyps.  His large hemorrhoids are likely the abnormality noted on CT.  Schedule colonoscopy. The risks (including bleeding, perforation, infection, missed lesions, medication reactions and possible hospitalization or surgery if  complications occur), benefits, and alternatives to colonoscopy with possible biopsy and possible polypectomy were discussed with the patient and they consent to proceed.   2.  Atrial fibrillation maintained on Coumadin.  Hold Coumadin 5 days before procedure - will instruct when and how to resume after procedure. Low but real risk of cardiovascular event such as heart attack, stroke, embolism, thrombosis or ischemia/infarct of other organs off Coumadin explained and need to seek urgent help if this occurs. The patient consents to proceed. Will communicate by phone or EMR with patient's prescribing provider to confirm that holding Coumadin is reasonable in this case.   3. Short segment Barrett's esophagus without dysplasia.  Surveillance EGD recommended in 09/2018.  Follow standard anti-reflex measures and continue omeprazole 20 mg daily.  4. ESRD on HD  5. S/P TAVR in 2018.  6. DM.   7. Legally blind.

## 2018-01-05 NOTE — Transfer of Care (Signed)
Immediate Anesthesia Transfer of Care Note  Patient: JASPAL PULTZ  Procedure(s) Performed: COLONOSCOPY WITH PROPOFOL (N/A ) POLYPECTOMY HOT HEMOSTASIS (ARGON PLASMA COAGULATION/BICAP) (N/A )  Patient Location: Endoscopy Unit  Anesthesia Type:MAC  Level of Consciousness: awake, alert  and oriented  Airway & Oxygen Therapy: Patient Spontanous Breathing and Patient connected to nasal cannula oxygen  Post-op Assessment: Report given to RN, Post -op Vital signs reviewed and stable and Patient moving all extremities X 4  Post vital signs: Reviewed and stable  Last Vitals:  Vitals Value Taken Time  BP 104/56 01/05/2018  2:36 PM  Temp    Pulse 65 01/05/2018  2:36 PM  Resp 18 01/05/2018  2:36 PM  SpO2 91 % 01/05/2018  2:36 PM    Last Pain:  Vitals:   01/05/18 1436  TempSrc:   PainSc: 0-No pain         Complications: No apparent anesthesia complications

## 2018-01-05 NOTE — Anesthesia Postprocedure Evaluation (Signed)
Anesthesia Post Note  Patient: Alexander [name]  Procedure(s) Performed: COLONOSCOPY WITH PROPOFOL (N/A ) POLYPECTOMY HOT HEMOSTASIS (ARGON PLASMA COAGULATION/BICAP) (N/A )     Patient location during evaluation: PACU Anesthesia Type: MAC Level of consciousness: awake and alert Pain management: pain level controlled Vital Signs Assessment: post-procedure vital signs reviewed and stable Respiratory status: spontaneous breathing, nonlabored ventilation, respiratory function stable and patient connected to nasal cannula oxygen Cardiovascular status: stable and blood pressure returned to baseline Postop Assessment: no apparent nausea or vomiting Anesthetic complications: no    Last Vitals:  Vitals:   01/05/18 1436 01/05/18 1450  BP: (!) 104/56 (!) 128/51  Pulse: 65 61  Resp: 18 16  Temp:    SpO2: 91% 98%    Last Pain:  Vitals:   01/05/18 1450  TempSrc:   PainSc: 0-No pain                 [name] [name]

## 2018-01-05 NOTE — Discharge Instructions (Signed)
YOU HAD AN ENDOSCOPIC PROCEDURE TODAY: Refer to the procedure report and other information in the discharge instructions given to you for any specific questions about what was found during the examination. If this information does not answer your questions, please call [place] office at [phone] to clarify.  ° °YOU SHOULD EXPECT: Some feelings of bloating in the abdomen. Passage of more gas than usual. Walking can help get rid of the air that was put into your GI tract during the procedure and reduce the bloating. If you had a lower endoscopy (such as a colonoscopy or flexible sigmoidoscopy) you may notice spotting of blood in your stool or on the toilet paper. Some abdominal soreness may be present for a day or two, also. ° °DIET: Your first meal following the procedure should be a light meal and then it is ok to progress to your normal diet. A half-sandwich or bowl of soup is an example of a good first meal. Heavy or fried foods are harder to digest and may make you feel nauseous or bloated. Drink plenty of fluids but you should avoid alcoholic beverages for 24 hours. If you had a esophageal dilation, please see attached instructions for diet.   ° °ACTIVITY: Your care partner should take you home directly after the procedure. You should plan to take it easy, moving slowly for the rest of the day. You can resume normal activity the day after the procedure however YOU SHOULD NOT DRIVE, use power tools, machinery or perform tasks that involve climbing or major physical exertion for 24 hours (because of the sedation medicines used during the test).  ° °SYMPTOMS TO REPORT IMMEDIATELY: °A gastroenterologist can be reached at any hour. Please call [phone]  for any of the following symptoms:  °Following lower endoscopy (colonoscopy, flexible sigmoidoscopy) °Excessive amounts of blood in the stool  °Significant tenderness, worsening of abdominal pains  °Swelling of the abdomen that is new, acute  °Fever of 100° or  higher  °Following upper endoscopy (EGD, EUS, ERCP, esophageal dilation) °Vomiting of blood or coffee ground material  °New, significant abdominal pain  °New, significant chest pain or pain under the shoulder blades  °Painful or persistently difficult swallowing  °New shortness of breath  °Black, tarry-looking or red, bloody stools ° °FOLLOW UP:  °If any biopsies were taken you will be contacted by phone or by letter within the next 1-3 weeks. Call [phone]  if you have not heard about the biopsies in 3 weeks.  °Please also call with any specific questions about appointments or follow up tests. ° °

## 2018-01-05 NOTE — Anesthesia Preprocedure Evaluation (Addendum)
Anesthesia Evaluation  Patient identified by MRN, date of birth, ID band Patient awake    Reviewed: Allergy & Precautions, NPO status , Patient's Chart, lab work & pertinent test results  History of Anesthesia Complications (+) PONV and history of anesthetic complications  Airway Mallampati: III  TM Distance: >3 FB Neck ROM: Full    Dental no notable dental hx.    Pulmonary former smoker,    Pulmonary exam normal breath sounds clear to auscultation       Cardiovascular hypertension, + CAD and +CHF  Normal cardiovascular exam+ pacemaker  Rhythm:Regular Rate:Normal  Normal remote reviewed. Battery and lead parameters stable.  ECG: v-paced rhythm, rate 63   ECHO: LV EF: 60% - 65%   Neuro/Psych PSYCHIATRIC DISORDERS Depression Left sided weakness  CVA, Residual Symptoms    GI/Hepatic Neg liver ROS, PUD, GERD  Medicated and Controlled,  Endo/Other  diabetes  Renal/GU ESRF and DialysisRenal diseaseOn HD M, W, F     Musculoskeletal negative musculoskeletal ROS (+)   Abdominal   Peds  Hematology HLD   Anesthesia Other Findings Hx of polyps Recent dx of bladder cancer  Reproductive/Obstetrics                            Anesthesia Physical Anesthesia Plan  ASA: IV  Anesthesia Plan: MAC   Post-op Pain Management:    Induction: Intravenous  PONV Risk Score and Plan: 2 and Propofol infusion and Treatment may vary due to age or medical condition  Airway Management Planned: Natural Airway  Additional Equipment:   Intra-op Plan:   Post-operative Plan:   Informed Consent: I have reviewed the patients History and Physical, chart, labs and discussed the procedure including the risks, benefits and alternatives for the proposed anesthesia with the patient or authorized representative who has indicated his/her understanding and acceptance.   Dental advisory given  Plan Discussed with:  CRNA  Anesthesia Plan Comments:        Anesthesia Quick Evaluation

## 2018-01-05 NOTE — Op Note (Addendum)
[facility] Patient Name: Alexander [name] Procedure Date : 01/05/2018 MRN: [id number] Attending MD: Ladene Artist , MD Date of Birth: 1940-08-19 CSN: [id number] Age: 77 Admit Type: Outpatient Procedure:                Colonoscopy Indications:              Abnormal CT of the GI tract (rectum), personal                            history of adenomatous colon polyps. Providers:                Pricilla Riffle. Fuller Plan, MD, Baird Cancer, RN, Cletis Athens, Technician Referring MD:             Lavell Islam. Tommy Medal, MD Medicines:                Monitored Anesthesia Care Complications:            No immediate complications. Estimated blood loss:                            None. Estimated Blood Loss:     Estimated blood loss: none. Procedure:                Pre-Anesthesia Assessment:                           - Prior to the procedure, a History and Physical                            was performed, and patient medications and                            allergies were reviewed. The patient's tolerance of                            previous anesthesia was also reviewed. The risks                            and benefits of the procedure and the sedation                            options and risks were discussed with the patient.                            All questions were answered, and informed consent                            was obtained. Prior Anticoagulants: The patient has                            taken Coumadin (warfarin), last dose was 3 days  prior to procedure. ASA Grade Assessment: III - A                            patient with severe systemic disease. After                            reviewing the risks and benefits, the patient was                            deemed in satisfactory condition to undergo the                            procedure.                           After obtaining informed consent, the colonoscope                            was passed under direct vision. Throughout the                            procedure, the patient's blood pressure, pulse, and                            oxygen saturations were monitored continuously. The                            PCF-[id number] ([id number]) peds colon was introduced                            through the anus and advanced to the the cecum,                            identified by appendiceal orifice and ileocecal                            valve. The ileocecal valve, appendiceal orifice,                            and rectum were photographed. The quality of the                            bowel preparation was adequate for identifying                            larger polyps. The colonoscopy was performed                            without difficulty. The patient tolerated the                            procedure well. Scope In: 1:41:02 PM Scope Out: 2:14:20 PM Scope Withdrawal Time: 0 hours 27 minutes 8 seconds  Total Procedure Duration: 0 hours 33 minutes 18 seconds  Findings:  The perianal and digital rectal examinations were normal.      Three sessile polyps were found in the descending colon (14 mm),       transverse colon (9 mm) and cecum (8 mm). The polyps were 8 to 14 mm in       size. These polyps were removed with a hot snare. Resection and       retrieval were complete.      Four sessile polyps were found in the descending colon (1), ascending       colon (1) and cecum (2). The polyps were 6 to 8 mm in size. These polyps       were removed with a cold snare. Resection and retrieval were complete.       For hemostasis at the mildly oozing ascending colon polypectomy site,       two hemostatic clips were successfully placed (MR conditional). There       was no bleeding at the end of the procedure.      Multiple medium-mouthed diverticula were found in the left colon. There       was narrowing of the colon in association with the  diverticular opening.       There was evidence of diverticular spasm. There was no evidence of       diverticular bleeding.      Internal hemorrhoids were found during retroflexion. The hemorrhoids       were medium-sized and Grade I (internal hemorrhoids that do not       prolapse).      The exam was otherwise without abnormality on direct and retroflexion       views. Impression:               - Three 8 to 14 mm polyps in the descending colon,                            in the transverse colon and in the cecum, removed                            with a hot snare. Resected and retrieved.                           - Four 6 to 8 mm polyps in the descending colon, in                            the ascending colon and in the cecum, removed with                            a cold snare. Resected and retrieved. Clips (MR                            conditional) were placed at the ascending colon                            site.                           - Moderate diverticulosis in the left colon.                           -  Internal hemorrhoids.                           - The examination was otherwise normal on direct                            and retroflexion views. Recommendation:           - Resume Coumadin (warfarin) tomorrow and Lovenox                            (enoxaparin) in 2 days as directed. Refer to                            managing physician for further adjustment of                            therapy.                           - Patient has a contact number available for                            emergencies. The signs and symptoms of potential                            delayed complications were discussed with the                            patient. Return to normal activities tomorrow.                            Written discharge instructions were provided to the                            patient.                           - Resume previous diet.                            - Continue present medications.                           - Await pathology results.                           - No aspirin, ibuprofen, naproxen, or other                            non-steroidal anti-inflammatory drugs for 2 weeks                            after polyp removal.                           - No repeat colonoscopy due to age, comorbidities.                           -  Prep H supp qd as needed for hemorrhoidal                            symptoms. Procedure Code(s):        --- Professional ---                           [phone], Colonoscopy, flexible; with removal of                            tumor(s), polyp(s), or other lesion(s) by snare                            technique Diagnosis Code(s):        --- Professional ---                           D12.3, Benign neoplasm of transverse colon (hepatic                            flexure or splenic flexure)                           D12.4, Benign neoplasm of descending colon                           D12.2, Benign neoplasm of ascending colon                           D12.0, Benign neoplasm of cecum                           K64.0, First degree hemorrhoids                           K57.30, Diverticulosis of large intestine without                            perforation or abscess without bleeding                           R93.3, Abnormal findings on diagnostic imaging of                            other parts of digestive tract CPT copyright 2018 [employer]. All rights reserved. The codes documented in this report are preliminary and upon coder review may  be revised to meet current compliance requirements. Ladene Artist, MD 01/05/2018 2:38:25 PM This report has been signed electronically. Number of Addenda: 0

## 2018-01-06 ENCOUNTER — Telehealth: Payer: Self-pay

## 2018-01-06 ENCOUNTER — Encounter: Payer: Self-pay | Admitting: Gastroenterology

## 2018-01-06 DIAGNOSIS — Z23 Encounter for immunization: Secondary | ICD-10-CM | POA: Diagnosis not present

## 2018-01-06 DIAGNOSIS — N186 End stage renal disease: Secondary | ICD-10-CM | POA: Diagnosis not present

## 2018-01-06 DIAGNOSIS — N2581 Secondary hyperparathyroidism of renal origin: Secondary | ICD-10-CM | POA: Diagnosis not present

## 2018-01-06 DIAGNOSIS — D631 Anemia in chronic kidney disease: Secondary | ICD-10-CM | POA: Diagnosis not present

## 2018-01-06 NOTE — Telephone Encounter (Signed)
Left a message for Surgical Coordinator from [facility] to contact office and specify what type of surgery pt will be having. Received clearance form but needing clarification.

## 2018-01-06 NOTE — Telephone Encounter (Signed)
Received a call stating pt will be having a Bilateral Nephroureterectomy & Cysto without urinary diversion.

## 2018-01-06 NOTE — Telephone Encounter (Signed)
   [facility] Pre-operative Risk Assessment    Request for surgical clearance:  1. What type of surgery is being performed? Bilateral Nephroureterectomy & Cysto without Urinary Diverson   2. When is this surgery scheduled? TBD   3. What type of clearance is required (medical clearance vs. Pharmacy clearance to hold med vs. Both)? Both  Are there any medications that need to be held prior to surgery and how long? Coumadin & Lovenox  4. Practice name and name of physician performing surgery? [facility] [place]   5. What is your office phone number [phone]    7.   What is your office fax number [phone]  8.   Anesthesia type (None, local, MAC, general) ? Unknown    Meryl Crutch 01/06/2018, 3:43 PM  _________________________________________________________________   (provider comments below)

## 2018-01-07 ENCOUNTER — Encounter (HOSPITAL_COMMUNITY): Payer: Self-pay | Admitting: Gastroenterology

## 2018-01-09 ENCOUNTER — Ambulatory Visit (INDEPENDENT_AMBULATORY_CARE_PROVIDER_SITE_OTHER): Payer: Medicare Other | Admitting: Pharmacist Clinician (PhC)/ Clinical Pharmacy Specialist

## 2018-01-09 ENCOUNTER — Other Ambulatory Visit: Payer: Self-pay | Admitting: Urology

## 2018-01-09 DIAGNOSIS — D631 Anemia in chronic kidney disease: Secondary | ICD-10-CM | POA: Diagnosis not present

## 2018-01-09 DIAGNOSIS — Z7901 Long term (current) use of anticoagulants: Secondary | ICD-10-CM

## 2018-01-09 DIAGNOSIS — N186 End stage renal disease: Secondary | ICD-10-CM | POA: Diagnosis not present

## 2018-01-09 DIAGNOSIS — I4891 Unspecified atrial fibrillation: Secondary | ICD-10-CM

## 2018-01-09 DIAGNOSIS — N2581 Secondary hyperparathyroidism of renal origin: Secondary | ICD-10-CM | POA: Diagnosis not present

## 2018-01-09 DIAGNOSIS — Z23 Encounter for immunization: Secondary | ICD-10-CM | POA: Diagnosis not present

## 2018-01-09 LAB — POCT INR: INR: 1.2 — AB (ref 2.0–3.0)

## 2018-01-09 NOTE — Patient Instructions (Signed)
Description   Take 1.5 tablets Monday Oct 14 and Tuesday Oct 15.  Take enoxaparin injections Monday and Tuesday as well.   Then continue with 5 mg daily except 2.5 mg each Monday, Wednesday and Friday.  Repeat INR in 1 week

## 2018-01-11 DIAGNOSIS — N186 End stage renal disease: Secondary | ICD-10-CM | POA: Diagnosis not present

## 2018-01-11 DIAGNOSIS — Z23 Encounter for immunization: Secondary | ICD-10-CM | POA: Diagnosis not present

## 2018-01-11 DIAGNOSIS — N2581 Secondary hyperparathyroidism of renal origin: Secondary | ICD-10-CM | POA: Diagnosis not present

## 2018-01-11 DIAGNOSIS — D631 Anemia in chronic kidney disease: Secondary | ICD-10-CM | POA: Diagnosis not present

## 2018-01-12 DIAGNOSIS — G629 Polyneuropathy, unspecified: Secondary | ICD-10-CM | POA: Diagnosis not present

## 2018-01-12 DIAGNOSIS — M79604 Pain in right leg: Secondary | ICD-10-CM | POA: Diagnosis not present

## 2018-01-12 DIAGNOSIS — G894 Chronic pain syndrome: Secondary | ICD-10-CM | POA: Diagnosis not present

## 2018-01-12 DIAGNOSIS — M47816 Spondylosis without myelopathy or radiculopathy, lumbar region: Secondary | ICD-10-CM | POA: Diagnosis not present

## 2018-01-13 DIAGNOSIS — N2581 Secondary hyperparathyroidism of renal origin: Secondary | ICD-10-CM | POA: Diagnosis not present

## 2018-01-13 DIAGNOSIS — N186 End stage renal disease: Secondary | ICD-10-CM | POA: Diagnosis not present

## 2018-01-13 DIAGNOSIS — Z23 Encounter for immunization: Secondary | ICD-10-CM | POA: Diagnosis not present

## 2018-01-13 DIAGNOSIS — D631 Anemia in chronic kidney disease: Secondary | ICD-10-CM | POA: Diagnosis not present

## 2018-01-16 ENCOUNTER — Ambulatory Visit (INDEPENDENT_AMBULATORY_CARE_PROVIDER_SITE_OTHER): Payer: Medicare Other | Admitting: Pharmacist Clinician (PhC)/ Clinical Pharmacy Specialist

## 2018-01-16 DIAGNOSIS — I4891 Unspecified atrial fibrillation: Secondary | ICD-10-CM

## 2018-01-16 DIAGNOSIS — D631 Anemia in chronic kidney disease: Secondary | ICD-10-CM | POA: Diagnosis not present

## 2018-01-16 DIAGNOSIS — N186 End stage renal disease: Secondary | ICD-10-CM | POA: Diagnosis not present

## 2018-01-16 DIAGNOSIS — Z7901 Long term (current) use of anticoagulants: Secondary | ICD-10-CM

## 2018-01-16 DIAGNOSIS — N2581 Secondary hyperparathyroidism of renal origin: Secondary | ICD-10-CM | POA: Diagnosis not present

## 2018-01-16 DIAGNOSIS — Z23 Encounter for immunization: Secondary | ICD-10-CM | POA: Diagnosis not present

## 2018-01-16 LAB — POCT INR: INR: 3.4 — AB (ref 2.0–3.0)

## 2018-01-16 NOTE — Telephone Encounter (Signed)
   Primary Cardiologist: Skeet Latch, MD  Chart reviewed as part of pre-operative protocol coverage. Patient was contacted 01/16/2018 in reference to pre-operative risk assessment for pending surgery as outlined below.  Alexander [name] was last seen 11/15/17 by [name]. H/o severe aortic stenosis s/p TAVR, ESRD on HD, hypotension on midodrine, diabetes mellitus, hyperlipidemia, carotid artery disease with complete occlusion of the L carotid, CVA, longstanding persistent atrial fibrillation, bradycardia, PPM, prior sepsis 2/2 strep bacteremia presumed due to HD access, Enterococcal UTI 10/2017, possible TIA 10/2017.  He was cleared for another urologic surgery (TURP) 11/2017 by phone as he was felt to be doing well per APP note. However, I spoke with Alexander [name], his daughter, who states that he typically defers to herself and his wife to help assist with decision-making and correspondence with his medical status given his poor functional status. He is currently in dialysis and unable to talk at the moment. She feels he is maintaining "status quo" but since having a stroke in general requires assistance and does very little activity. He has not had any recent CP, SOB, or syncope. The patient is planning on having both kidneys and his bladder removed this time.  RCRI is >11% indicating he is at high risk for cardiac complications given his comorbidities, therefore would like input from [name] regarding formal decision on clearance. [name], -- Please route response to P CV DIV PREOP (the pre-op pool). Thank you.  Charlie Pitter, PA-C 01/16/2018, 4:32 PM

## 2018-01-16 NOTE — Telephone Encounter (Signed)
Pt is a 33 yoM on warfarin for pAF (CVasc:7; age, DM, CAD, CVA, HTN), also with a history of TAVR and is ESRD on HD. Received clearance request for Bilateral Nephroureterectomy & Cysto without Urinary Diverson at [facility].   Pt will need to hold warfarin for 5 days prior to procedure with Lovenox bridge to be coordinated in Coumadin clinic. Plan to recheck INR ~5 days after procedure.   Pt will not require SBE prophylaxis for GU procedure.

## 2018-01-16 NOTE — Telephone Encounter (Signed)
   Primary Cardiologist: Skeet Latch, MD  Chart reviewed as part of pre-operative protocol coverage. Will route to pharm for formal input on plan for anticoagulation as well as recommendation on SBE prophylaxis, then pt will need phone call by preop team.  Charlie Pitter, PA-C 01/16/2018, 2:28 PM

## 2018-01-18 DIAGNOSIS — Z23 Encounter for immunization: Secondary | ICD-10-CM | POA: Diagnosis not present

## 2018-01-18 DIAGNOSIS — N2581 Secondary hyperparathyroidism of renal origin: Secondary | ICD-10-CM | POA: Diagnosis not present

## 2018-01-18 DIAGNOSIS — N186 End stage renal disease: Secondary | ICD-10-CM | POA: Diagnosis not present

## 2018-01-18 DIAGNOSIS — D631 Anemia in chronic kidney disease: Secondary | ICD-10-CM | POA: Diagnosis not present

## 2018-01-18 NOTE — Telephone Encounter (Signed)
Follow Up:     Elmyra Ricks is checking on the status of pt's clearance.-Please fax asap to [phone] Att: Elmyra Ricks.

## 2018-01-18 NOTE — Telephone Encounter (Signed)
Clearance faxed via fax Machine to [facility]

## 2018-01-18 NOTE — Telephone Encounter (Signed)
   Primary Cardiologist: Skeet Latch, MD  Chart reviewed as part of pre-operative protocol coverage.  Because of his complicated medical history and significant comorbidities, [name] was contacted regarding clearance.  She stated that he is at high risk for any procedure.  However, if he needs surgery for cancer, he needs the surgery.  Therefore, the risk is considered high but not prohibitive, if the surgery is needed. No further cardiovascular testing is indicated.  I will route this recommendation to the requesting party via Epic fax function and remove from pre-op pool.  Please call with questions.  Rosaria Ferries, PA-C 01/18/2018, 2:41 PM

## 2018-01-18 NOTE — Telephone Encounter (Signed)
Please let surgeon office know that we are waiting on Dr. Blenda Mounts recommendations.

## 2018-01-20 DIAGNOSIS — N186 End stage renal disease: Secondary | ICD-10-CM | POA: Diagnosis not present

## 2018-01-20 DIAGNOSIS — N2581 Secondary hyperparathyroidism of renal origin: Secondary | ICD-10-CM | POA: Diagnosis not present

## 2018-01-20 DIAGNOSIS — Z23 Encounter for immunization: Secondary | ICD-10-CM | POA: Diagnosis not present

## 2018-01-20 DIAGNOSIS — D631 Anemia in chronic kidney disease: Secondary | ICD-10-CM | POA: Diagnosis not present

## 2018-01-23 DIAGNOSIS — D631 Anemia in chronic kidney disease: Secondary | ICD-10-CM | POA: Diagnosis not present

## 2018-01-23 DIAGNOSIS — N186 End stage renal disease: Secondary | ICD-10-CM | POA: Diagnosis not present

## 2018-01-23 DIAGNOSIS — N2581 Secondary hyperparathyroidism of renal origin: Secondary | ICD-10-CM | POA: Diagnosis not present

## 2018-01-23 DIAGNOSIS — Z23 Encounter for immunization: Secondary | ICD-10-CM | POA: Diagnosis not present

## 2018-01-25 DIAGNOSIS — D631 Anemia in chronic kidney disease: Secondary | ICD-10-CM | POA: Diagnosis not present

## 2018-01-25 DIAGNOSIS — N186 End stage renal disease: Secondary | ICD-10-CM | POA: Diagnosis not present

## 2018-01-25 DIAGNOSIS — Z23 Encounter for immunization: Secondary | ICD-10-CM | POA: Diagnosis not present

## 2018-01-25 DIAGNOSIS — N2581 Secondary hyperparathyroidism of renal origin: Secondary | ICD-10-CM | POA: Diagnosis not present

## 2018-01-26 ENCOUNTER — Other Ambulatory Visit: Payer: Self-pay | Admitting: Cardiovascular Disease

## 2018-01-27 DIAGNOSIS — Z7901 Long term (current) use of anticoagulants: Secondary | ICD-10-CM | POA: Diagnosis not present

## 2018-01-27 DIAGNOSIS — Z992 Dependence on renal dialysis: Secondary | ICD-10-CM | POA: Diagnosis not present

## 2018-01-27 DIAGNOSIS — E1129 Type 2 diabetes mellitus with other diabetic kidney complication: Secondary | ICD-10-CM | POA: Diagnosis not present

## 2018-01-27 DIAGNOSIS — I4891 Unspecified atrial fibrillation: Secondary | ICD-10-CM | POA: Diagnosis not present

## 2018-01-27 DIAGNOSIS — N2581 Secondary hyperparathyroidism of renal origin: Secondary | ICD-10-CM | POA: Diagnosis not present

## 2018-01-27 DIAGNOSIS — N186 End stage renal disease: Secondary | ICD-10-CM | POA: Diagnosis not present

## 2018-01-27 DIAGNOSIS — D631 Anemia in chronic kidney disease: Secondary | ICD-10-CM | POA: Diagnosis not present

## 2018-01-30 ENCOUNTER — Ambulatory Visit (INDEPENDENT_AMBULATORY_CARE_PROVIDER_SITE_OTHER): Payer: Medicare Other | Admitting: Pharmacist Clinician (PhC)/ Clinical Pharmacy Specialist

## 2018-01-30 DIAGNOSIS — I4891 Unspecified atrial fibrillation: Secondary | ICD-10-CM | POA: Diagnosis not present

## 2018-01-30 DIAGNOSIS — Z7901 Long term (current) use of anticoagulants: Secondary | ICD-10-CM

## 2018-01-30 DIAGNOSIS — N186 End stage renal disease: Secondary | ICD-10-CM | POA: Diagnosis not present

## 2018-01-30 DIAGNOSIS — N2581 Secondary hyperparathyroidism of renal origin: Secondary | ICD-10-CM | POA: Diagnosis not present

## 2018-01-30 DIAGNOSIS — D631 Anemia in chronic kidney disease: Secondary | ICD-10-CM | POA: Diagnosis not present

## 2018-01-30 LAB — POCT INR: INR: 2.2 (ref 2.0–3.0)

## 2018-01-31 ENCOUNTER — Ambulatory Visit (INDEPENDENT_AMBULATORY_CARE_PROVIDER_SITE_OTHER): Payer: Medicare Other | Admitting: *Deleted

## 2018-01-31 DIAGNOSIS — R001 Bradycardia, unspecified: Secondary | ICD-10-CM | POA: Diagnosis not present

## 2018-01-31 DIAGNOSIS — I495 Sick sinus syndrome: Secondary | ICD-10-CM

## 2018-02-01 DIAGNOSIS — I482 Chronic atrial fibrillation, unspecified: Secondary | ICD-10-CM | POA: Diagnosis not present

## 2018-02-01 DIAGNOSIS — N186 End stage renal disease: Secondary | ICD-10-CM | POA: Diagnosis not present

## 2018-02-01 DIAGNOSIS — D631 Anemia in chronic kidney disease: Secondary | ICD-10-CM | POA: Diagnosis not present

## 2018-02-01 DIAGNOSIS — N2581 Secondary hyperparathyroidism of renal origin: Secondary | ICD-10-CM | POA: Diagnosis not present

## 2018-02-01 LAB — PROTIME-INR: INR: 1.7 — AB (ref 0.9–1.1)

## 2018-02-01 NOTE — Progress Notes (Signed)
Remote pacemaker transmission.   

## 2018-02-03 ENCOUNTER — Other Ambulatory Visit: Payer: Self-pay | Admitting: Cardiovascular Disease

## 2018-02-03 DIAGNOSIS — N186 End stage renal disease: Secondary | ICD-10-CM | POA: Diagnosis not present

## 2018-02-03 DIAGNOSIS — D631 Anemia in chronic kidney disease: Secondary | ICD-10-CM | POA: Diagnosis not present

## 2018-02-03 DIAGNOSIS — N2581 Secondary hyperparathyroidism of renal origin: Secondary | ICD-10-CM | POA: Diagnosis not present

## 2018-02-05 ENCOUNTER — Encounter: Payer: Self-pay | Admitting: Cardiology

## 2018-02-06 DIAGNOSIS — N2581 Secondary hyperparathyroidism of renal origin: Secondary | ICD-10-CM | POA: Diagnosis not present

## 2018-02-06 DIAGNOSIS — D631 Anemia in chronic kidney disease: Secondary | ICD-10-CM | POA: Diagnosis not present

## 2018-02-06 DIAGNOSIS — N186 End stage renal disease: Secondary | ICD-10-CM | POA: Diagnosis not present

## 2018-02-06 DIAGNOSIS — M961 Postlaminectomy syndrome, not elsewhere classified: Secondary | ICD-10-CM | POA: Diagnosis not present

## 2018-02-06 DIAGNOSIS — M47816 Spondylosis without myelopathy or radiculopathy, lumbar region: Secondary | ICD-10-CM | POA: Diagnosis not present

## 2018-02-07 DIAGNOSIS — C678 Malignant neoplasm of overlapping sites of bladder: Secondary | ICD-10-CM | POA: Diagnosis not present

## 2018-02-07 DIAGNOSIS — N281 Cyst of kidney, acquired: Secondary | ICD-10-CM | POA: Diagnosis not present

## 2018-02-08 ENCOUNTER — Ambulatory Visit (INDEPENDENT_AMBULATORY_CARE_PROVIDER_SITE_OTHER): Payer: Medicare Other | Admitting: Pharmacist Clinician (PhC)/ Clinical Pharmacy Specialist

## 2018-02-08 ENCOUNTER — Telehealth: Payer: Self-pay | Admitting: Cardiovascular Disease

## 2018-02-08 DIAGNOSIS — I4891 Unspecified atrial fibrillation: Secondary | ICD-10-CM | POA: Diagnosis not present

## 2018-02-08 DIAGNOSIS — D631 Anemia in chronic kidney disease: Secondary | ICD-10-CM | POA: Diagnosis not present

## 2018-02-08 DIAGNOSIS — Z7901 Long term (current) use of anticoagulants: Secondary | ICD-10-CM | POA: Diagnosis not present

## 2018-02-08 DIAGNOSIS — N186 End stage renal disease: Secondary | ICD-10-CM | POA: Diagnosis not present

## 2018-02-08 DIAGNOSIS — N2581 Secondary hyperparathyroidism of renal origin: Secondary | ICD-10-CM | POA: Diagnosis not present

## 2018-02-08 LAB — POCT INR: INR: 1.8 — AB (ref 2.0–3.0)

## 2018-02-08 NOTE — Telephone Encounter (Signed)
Records received from [facility] on 02/08/18, Appt 03/07/18 @ 11:20PM. NV

## 2018-02-09 ENCOUNTER — Telehealth: Payer: Self-pay | Admitting: Cardiovascular Disease

## 2018-02-09 DIAGNOSIS — M47816 Spondylosis without myelopathy or radiculopathy, lumbar region: Secondary | ICD-10-CM | POA: Diagnosis not present

## 2018-02-09 DIAGNOSIS — G894 Chronic pain syndrome: Secondary | ICD-10-CM | POA: Diagnosis not present

## 2018-02-09 DIAGNOSIS — H40013 Open angle with borderline findings, low risk, bilateral: Secondary | ICD-10-CM | POA: Diagnosis not present

## 2018-02-09 DIAGNOSIS — M79604 Pain in right leg: Secondary | ICD-10-CM | POA: Diagnosis not present

## 2018-02-09 DIAGNOSIS — G629 Polyneuropathy, unspecified: Secondary | ICD-10-CM | POA: Diagnosis not present

## 2018-02-09 NOTE — Telephone Encounter (Signed)
Received notes from [facility] on 02/09/18, Appt 03/07/18 @ 11:20AM. NV

## 2018-02-10 DIAGNOSIS — D631 Anemia in chronic kidney disease: Secondary | ICD-10-CM | POA: Diagnosis not present

## 2018-02-10 DIAGNOSIS — N2581 Secondary hyperparathyroidism of renal origin: Secondary | ICD-10-CM | POA: Diagnosis not present

## 2018-02-10 DIAGNOSIS — N186 End stage renal disease: Secondary | ICD-10-CM | POA: Diagnosis not present

## 2018-02-13 DIAGNOSIS — N186 End stage renal disease: Secondary | ICD-10-CM | POA: Diagnosis not present

## 2018-02-13 DIAGNOSIS — D631 Anemia in chronic kidney disease: Secondary | ICD-10-CM | POA: Diagnosis not present

## 2018-02-13 DIAGNOSIS — N2581 Secondary hyperparathyroidism of renal origin: Secondary | ICD-10-CM | POA: Diagnosis not present

## 2018-02-15 DIAGNOSIS — D631 Anemia in chronic kidney disease: Secondary | ICD-10-CM | POA: Diagnosis not present

## 2018-02-15 DIAGNOSIS — N186 End stage renal disease: Secondary | ICD-10-CM | POA: Diagnosis not present

## 2018-02-15 DIAGNOSIS — N2581 Secondary hyperparathyroidism of renal origin: Secondary | ICD-10-CM | POA: Diagnosis not present

## 2018-02-16 ENCOUNTER — Inpatient Hospital Stay (HOSPITAL_COMMUNITY)
Admission: AD | Admit: 2018-02-16 | Discharge: 2018-02-24 | DRG: 653 | Disposition: A | Discharge status: Rehabilitation Facility | Payer: Medicare Other | Attending: Urology | Admitting: Urology

## 2018-02-16 ENCOUNTER — Other Ambulatory Visit: Payer: Self-pay

## 2018-02-16 DIAGNOSIS — Z87442 Personal history of urinary calculi: Secondary | ICD-10-CM

## 2018-02-16 DIAGNOSIS — N186 End stage renal disease: Secondary | ICD-10-CM | POA: Diagnosis present

## 2018-02-16 DIAGNOSIS — J302 Other seasonal allergic rhinitis: Secondary | ICD-10-CM | POA: Diagnosis present

## 2018-02-16 DIAGNOSIS — I4891 Unspecified atrial fibrillation: Secondary | ICD-10-CM

## 2018-02-16 DIAGNOSIS — Z9089 Acquired absence of other organs: Secondary | ICD-10-CM

## 2018-02-16 DIAGNOSIS — Z7952 Long term (current) use of systemic steroids: Secondary | ICD-10-CM

## 2018-02-16 DIAGNOSIS — K2270 Barrett's esophagus without dysplasia: Secondary | ICD-10-CM | POA: Diagnosis present

## 2018-02-16 DIAGNOSIS — I5032 Chronic diastolic (congestive) heart failure: Secondary | ICD-10-CM | POA: Diagnosis present

## 2018-02-16 DIAGNOSIS — Z79899 Other long term (current) drug therapy: Secondary | ICD-10-CM

## 2018-02-16 DIAGNOSIS — Z87891 Personal history of nicotine dependence: Secondary | ICD-10-CM

## 2018-02-16 DIAGNOSIS — R5381 Other malaise: Secondary | ICD-10-CM | POA: Diagnosis not present

## 2018-02-16 DIAGNOSIS — Z952 Presence of prosthetic heart valve: Secondary | ICD-10-CM

## 2018-02-16 DIAGNOSIS — N2581 Secondary hyperparathyroidism of renal origin: Secondary | ICD-10-CM | POA: Diagnosis not present

## 2018-02-16 DIAGNOSIS — F329 Major depressive disorder, single episode, unspecified: Secondary | ICD-10-CM | POA: Diagnosis present

## 2018-02-16 DIAGNOSIS — E1122 Type 2 diabetes mellitus with diabetic chronic kidney disease: Secondary | ICD-10-CM | POA: Diagnosis present

## 2018-02-16 DIAGNOSIS — K5730 Diverticulosis of large intestine without perforation or abscess without bleeding: Secondary | ICD-10-CM | POA: Diagnosis present

## 2018-02-16 DIAGNOSIS — H548 Legal blindness, as defined in USA: Secondary | ICD-10-CM | POA: Diagnosis present

## 2018-02-16 DIAGNOSIS — C679 Malignant neoplasm of bladder, unspecified: Principal | ICD-10-CM | POA: Diagnosis present

## 2018-02-16 DIAGNOSIS — Z86010 Personal history of colonic polyps: Secondary | ICD-10-CM

## 2018-02-16 DIAGNOSIS — Z950 Presence of cardiac pacemaker: Secondary | ICD-10-CM

## 2018-02-16 DIAGNOSIS — E119 Type 2 diabetes mellitus without complications: Secondary | ICD-10-CM

## 2018-02-16 DIAGNOSIS — I69354 Hemiplegia and hemiparesis following cerebral infarction affecting left non-dominant side: Secondary | ICD-10-CM

## 2018-02-16 DIAGNOSIS — K219 Gastro-esophageal reflux disease without esophagitis: Secondary | ICD-10-CM | POA: Diagnosis present

## 2018-02-16 DIAGNOSIS — D631 Anemia in chronic kidney disease: Secondary | ICD-10-CM | POA: Diagnosis present

## 2018-02-16 DIAGNOSIS — Y92230 Patient room in hospital as the place of occurrence of the external cause: Secondary | ICD-10-CM | POA: Diagnosis not present

## 2018-02-16 DIAGNOSIS — I2510 Atherosclerotic heart disease of native coronary artery without angina pectoris: Secondary | ICD-10-CM | POA: Diagnosis present

## 2018-02-16 DIAGNOSIS — Z800 Family history of malignant neoplasm of digestive organs: Secondary | ICD-10-CM

## 2018-02-16 DIAGNOSIS — Z8711 Personal history of peptic ulcer disease: Secondary | ICD-10-CM

## 2018-02-16 DIAGNOSIS — Z888 Allergy status to other drugs, medicaments and biological substances status: Secondary | ICD-10-CM

## 2018-02-16 DIAGNOSIS — H409 Unspecified glaucoma: Secondary | ICD-10-CM | POA: Diagnosis present

## 2018-02-16 DIAGNOSIS — R0902 Hypoxemia: Secondary | ICD-10-CM

## 2018-02-16 DIAGNOSIS — E875 Hyperkalemia: Secondary | ICD-10-CM | POA: Diagnosis not present

## 2018-02-16 DIAGNOSIS — K4090 Unilateral inguinal hernia, without obstruction or gangrene, not specified as recurrent: Secondary | ICD-10-CM | POA: Diagnosis present

## 2018-02-16 DIAGNOSIS — I132 Hypertensive heart and chronic kidney disease with heart failure and with stage 5 chronic kidney disease, or end stage renal disease: Secondary | ICD-10-CM | POA: Diagnosis present

## 2018-02-16 DIAGNOSIS — D62 Acute posthemorrhagic anemia: Secondary | ICD-10-CM | POA: Diagnosis not present

## 2018-02-16 DIAGNOSIS — Z880 Allergy status to penicillin: Secondary | ICD-10-CM

## 2018-02-16 DIAGNOSIS — H3530 Unspecified macular degeneration: Secondary | ICD-10-CM | POA: Diagnosis present

## 2018-02-16 DIAGNOSIS — Z7901 Long term (current) use of anticoagulants: Secondary | ICD-10-CM

## 2018-02-16 DIAGNOSIS — Z823 Family history of stroke: Secondary | ICD-10-CM

## 2018-02-16 DIAGNOSIS — G8918 Other acute postprocedural pain: Secondary | ICD-10-CM

## 2018-02-16 DIAGNOSIS — G5603 Carpal tunnel syndrome, bilateral upper limbs: Secondary | ICD-10-CM | POA: Diagnosis present

## 2018-02-16 DIAGNOSIS — E785 Hyperlipidemia, unspecified: Secondary | ICD-10-CM | POA: Diagnosis present

## 2018-02-16 DIAGNOSIS — Z992 Dependence on renal dialysis: Secondary | ICD-10-CM

## 2018-02-16 DIAGNOSIS — D649 Anemia, unspecified: Secondary | ICD-10-CM

## 2018-02-16 DIAGNOSIS — Z8249 Family history of ischemic heart disease and other diseases of the circulatory system: Secondary | ICD-10-CM

## 2018-02-16 DIAGNOSIS — I480 Paroxysmal atrial fibrillation: Secondary | ICD-10-CM | POA: Diagnosis present

## 2018-02-16 DIAGNOSIS — I9581 Postprocedural hypotension: Secondary | ICD-10-CM | POA: Diagnosis not present

## 2018-02-16 DIAGNOSIS — Z885 Allergy status to narcotic agent status: Secondary | ICD-10-CM

## 2018-02-16 DIAGNOSIS — I6930 Unspecified sequelae of cerebral infarction: Secondary | ICD-10-CM

## 2018-02-16 DIAGNOSIS — Z8679 Personal history of other diseases of the circulatory system: Secondary | ICD-10-CM

## 2018-02-16 DIAGNOSIS — E1142 Type 2 diabetes mellitus with diabetic polyneuropathy: Secondary | ICD-10-CM | POA: Diagnosis present

## 2018-02-16 LAB — COMPREHENSIVE METABOLIC PANEL
ALBUMIN: 3.4 g/dL — AB (ref 3.5–5.0)
ALT: 23 U/L (ref 0–44)
ANION GAP: 12 (ref 5–15)
AST: 20 U/L (ref 15–41)
Alkaline Phosphatase: 55 U/L (ref 38–126)
BUN: 12 mg/dL (ref 8–23)
CO2: 33 mmol/L — AB (ref 22–32)
Calcium: 8.6 mg/dL — ABNORMAL LOW (ref 8.9–10.3)
Chloride: 95 mmol/L — ABNORMAL LOW (ref 98–111)
Creatinine, Ser: 3.08 mg/dL — ABNORMAL HIGH (ref 0.61–1.24)
GFR calc Af Amer: 21 mL/min — ABNORMAL LOW (ref >60)
GFR calc non Af Amer: 18 mL/min — ABNORMAL LOW (ref >60)
GLUCOSE: 94 mg/dL (ref 70–99)
POTASSIUM: 3.2 mmol/L — AB (ref 3.5–5.1)
SODIUM: 140 mmol/L (ref 135–145)
TOTAL PROTEIN: 6.5 g/dL (ref 6.5–8.1)
Total Bilirubin: 1.1 mg/dL (ref 0.3–1.2)

## 2018-02-16 LAB — CBC
HCT: 35.2 % — ABNORMAL LOW (ref 39.0–52.0)
Hemoglobin: 10.7 g/dL — ABNORMAL LOW (ref 13.0–17.0)
MCH: 31.8 pg (ref 26.0–34.0)
MCHC: 30.4 g/dL (ref 30.0–36.0)
MCV: 104.5 fL — ABNORMAL HIGH (ref 80.0–100.0)
Platelets: 157 10*3/uL (ref 150–400)
RBC: 3.37 MIL/uL — ABNORMAL LOW (ref 4.22–5.81)
RDW: 17.8 % — AB (ref 11.5–15.5)
WBC: 7.2 10*3/uL (ref 4.0–10.5)
nRBC: 0 % (ref 0.0–0.2)

## 2018-02-16 LAB — PREPARE RBC (CROSSMATCH)

## 2018-02-16 MED ORDER — PRAVASTATIN SODIUM 10 MG PO TABS
10.0000 mg | ORAL_TABLET | Freq: Every day | ORAL | Status: DC
  Administered 2018-02-18 – 2018-02-24 (×6): 10 mg via ORAL
  Filled 2018-02-16 (×5): qty 1

## 2018-02-16 MED ORDER — PEG 3350-KCL-NA BICARB-NACL 420 G PO SOLR
4000.0000 mL | Freq: Once | ORAL | Status: AC
  Administered 2018-02-16: 4000 mL via ORAL

## 2018-02-16 MED ORDER — DORZOLAMIDE HCL-TIMOLOL MAL 2-0.5 % OP SOLN
1.0000 [drp] | Freq: Two times a day (BID) | OPHTHALMIC | Status: DC
  Administered 2018-02-16 – 2018-02-24 (×14): 1 [drp] via OPHTHALMIC
  Filled 2018-02-16 (×4): qty 10

## 2018-02-16 MED ORDER — CLINDAMYCIN PHOSPHATE 900 MG/50ML IV SOLN
900.0000 mg | Freq: Once | INTRAVENOUS | Status: AC
  Administered 2018-02-17 (×2): 900 mg via INTRAVENOUS
  Filled 2018-02-16: qty 50

## 2018-02-16 MED ORDER — PANTOPRAZOLE SODIUM 40 MG PO TBEC
40.0000 mg | DELAYED_RELEASE_TABLET | Freq: Every day | ORAL | Status: DC
  Administered 2018-02-18 – 2018-02-24 (×7): 40 mg via ORAL
  Filled 2018-02-16 (×7): qty 1

## 2018-02-16 MED ORDER — SEVELAMER CARBONATE 800 MG PO TABS
1600.0000 mg | ORAL_TABLET | Freq: Three times a day (TID) | ORAL | Status: DC
  Administered 2018-02-18 – 2018-02-24 (×17): 1600 mg via ORAL
  Filled 2018-02-16 (×19): qty 2

## 2018-02-16 MED ORDER — ACETAMINOPHEN 500 MG PO TABS
1000.0000 mg | ORAL_TABLET | Freq: Four times a day (QID) | ORAL | Status: DC | PRN
  Administered 2018-02-16 – 2018-02-22 (×2): 1000 mg via ORAL
  Filled 2018-02-16 (×4): qty 2

## 2018-02-16 MED ORDER — DIGOXIN 125 MCG PO TABS
62.5000 ug | ORAL_TABLET | Freq: Every day | ORAL | Status: DC
  Administered 2018-02-18 – 2018-02-24 (×7): 62.5 ug via ORAL
  Filled 2018-02-16 (×3): qty 1
  Filled 2018-02-16: qty 0.5
  Filled 2018-02-16 (×3): qty 1

## 2018-02-16 MED ORDER — LATANOPROST 0.005 % OP SOLN
1.0000 [drp] | Freq: Every day | OPHTHALMIC | Status: DC
  Administered 2018-02-16 – 2018-02-23 (×8): 1 [drp] via OPHTHALMIC
  Filled 2018-02-16 (×3): qty 2.5

## 2018-02-16 MED ORDER — PREDNISONE 5 MG PO TABS
5.0000 mg | ORAL_TABLET | Freq: Every day | ORAL | Status: DC
  Administered 2018-02-16 – 2018-02-23 (×8): 5 mg via ORAL
  Filled 2018-02-16 (×8): qty 1

## 2018-02-16 MED ORDER — DILTIAZEM HCL ER COATED BEADS 120 MG PO CP24
120.0000 mg | ORAL_CAPSULE | Freq: Every day | ORAL | Status: DC
  Administered 2018-02-18: 120 mg via ORAL
  Filled 2018-02-16: qty 1

## 2018-02-16 MED ORDER — GABAPENTIN 100 MG PO CAPS
200.0000 mg | ORAL_CAPSULE | Freq: Every day | ORAL | Status: DC
  Administered 2018-02-17 – 2018-02-23 (×7): 200 mg via ORAL
  Filled 2018-02-16 (×8): qty 2

## 2018-02-16 MED ORDER — CINACALCET HCL 30 MG PO TABS
60.0000 mg | ORAL_TABLET | Freq: Every day | ORAL | Status: DC
  Administered 2018-02-17 – 2018-02-23 (×7): 60 mg via ORAL
  Filled 2018-02-16 (×8): qty 2

## 2018-02-16 MED ORDER — BROMOCRIPTINE MESYLATE 2.5 MG PO TABS
5.0000 mg | ORAL_TABLET | ORAL | Status: DC
  Administered 2018-02-21 – 2018-02-23 (×2): 5 mg via ORAL
  Filled 2018-02-16 (×4): qty 2

## 2018-02-16 MED ORDER — CIPROFLOXACIN IN D5W 400 MG/200ML IV SOLN
400.0000 mg | INTRAVENOUS | Status: AC
  Administered 2018-02-17: 400 mg via INTRAVENOUS
  Filled 2018-02-16: qty 200

## 2018-02-16 MED ORDER — SERTRALINE HCL 50 MG PO TABS
50.0000 mg | ORAL_TABLET | Freq: Every day | ORAL | Status: DC
  Administered 2018-02-18 – 2018-02-24 (×7): 50 mg via ORAL
  Filled 2018-02-16 (×7): qty 1

## 2018-02-16 NOTE — H&P (Signed)
Alexander [name] is an 77 y.o. male.    Chief Complaint: Pre-OP Robotic Bilateral Nephroureterectomy and Cystoprostatectomy  HPI:   1 - Aggressive Variant Bladder Cancer - Plasmacytoid - Micropapillary varient urothelial bladder cancer, large volume / broad based by TURBT 11/2017 in eval gross hematuria. Staging CT clinically localized. T1G3 clinically. No gross prostatic involvement.   Left Renovascular Anatomy: 1 artery, 1 retroaortic vein (inferior to artery), accessory splenic vein just above renal artery / adrenal area   Right Renovascular Anatomy: 1 artery, 2 vein (upper dominant above artery, lower accessory just anterior to artery)    2 - End Stage Renal Disease - on hemodialysis MWF viea left arm AVF x years. Follows Alexander [name] at [facility]. Voids few ounces per day.   PMH sig for AS/TAVR / AFIB/coumadin / Arrythmia / Pacemaker (follows Alexander [name] and Alexander [name], has come off coumadin prior), CVA (transient aphasia). His wife and daughter Alexander [name], [phone]) are very involved. No abdominal surgeries. His PCP is Alexander Pretty MD.   Today "Alexander [name]" is seen as pre-op admission prior to major extirpative surgery tomorrow. He was dialyzed earlier toeday. No interval fevers. He had some recent tooth extractions complicated by small hematoma but now trouble breathing / swalloing.      Past Medical History:  Diagnosis Date  . Anemia   . Aortic stenosis 06/15/12   TEE - EF 60-73%; grade 1 diastolic dysfunction; mild/mod aortic valve stenosis; Mitral valve had calcified annulus, mild pulm htn PA peak pressure 71mHg  . Barrett's esophagus 05/2003  . Bradycardia 2017   [employer] 2240 Assurity dual-lead pacemaker  . Cancer ([facility]    bladder and kidney  . Carpal tunnel syndrome, bilateral 11/03/2015  . CHF (congestive heart failure) ([facility]   . Colon polyps   . Coronary artery disease   . CVA (cerebral infarction)    2004/affected left side  . Depression    . Diabetes mellitus without complication ([facility]   . Diabetic peripheral neuropathy ([facility] 10/02/2015  . Diverticulosis   . Dyspnea    with exertion  . ESRD (end stage renal disease) on dialysis ([facility]    "[facility]; [facility]; MWF" (05/12/2017)  . Failure to thrive syndrome, adult 10/30/2017  . GERD (gastroesophageal reflux disease)   . Glaucoma   . History of kidney stones   . Hyperlipidemia   . Hypertension   . Legally blind   . Macular degeneration    both eyes  . Orthostatic hypotension 09/09/2015  . Paroxysmal atrial fibrillation (HCC)   . Peptic ulcer    bleeding, 1969  . PONV (postoperative nausea and vomiting)   . Presence of permanent cardiac pacemaker   . S/P epidural steroid injection    last  injection over 10 years ago  . Seasonal allergies   . Tubular adenoma of colon 07/2001    Past Surgical History:  Procedure Laterality Date  . AV FISTULA PLACEMENT  2009  . BACK SURGERY    . CARDIOVERSION N/A 11/13/2015   Procedure: CARDIOVERSION;  Surgeon: TTroy Sine MD;  [facility];  Service: Cardiovascular;  Laterality: N/A;  . CARDIOVERSION N/A 01/13/2016   Procedure: CARDIOVERSION;  Surgeon: Will MMeredith [name] MD;  [facility]  Service: Cardiovascular;  Laterality: N/A;  . COLONOSCOPY WITH PROPOFOL N/A 01/05/2018   Procedure: COLONOSCOPY WITH PROPOFOL;  Surgeon: SLadene Artist MD;  [facility];  Service: Endoscopy;  Laterality: N/A;  . CAneta  right  eye  . CYSTOSCOPY  several times   kidney stones  . CYSTOSCOPY W/ RETROGRADES Bilateral 12/13/2017   Procedure: CYSTOSCOPY WITH RETROGRADE PYELOGRAM;  Surgeon: Irine Seal, MD;  [facility];  Service: Urology;  Laterality: Bilateral;  . EP IMPLANTABLE DEVICE N/A 03/11/2015   Procedure: Pacemaker Implant;  Surgeon: Will Meredith [name], MD;  [place];  Laterality: Left  . LAMINECTOMY  1969  . POLYPECTOMY  01/05/2018   Procedure: POLYPECTOMY;   Surgeon: Ladene Artist, MD;  Location[facility] END[facility];  Service: Endoscopy;;  . RIGHT/LEFT HEART CATH AND CORONARY ANGIOGRAPHY N/A 08/20/2016   Procedure: Right/Left Heart Cath and Coronary Angiography;  Surgeon: Burnell Blanks, MD;  Location: [facility] LAB;  Service: Cardiovascular;  Laterality: N/A;  . TEE WITHOUT CARDIOVERSION N/A 07/22/2016   Procedure: TRANSESOPHAGEAL ECHOCARDIOGRAM (TEE);  Surgeon: Skeet Latch, MD;  Location: [place];  Service: Cardiovascular;  Laterality: N/A;  . TEE WITHOUT CARDIOVERSION N/A 08/31/2016   Procedure: TRANSESOPHAGEAL ECHOCARDIOGRAM (TEE);  Surgeon: Burnell Blanks, MD;  Location: [place];  Service: Open Heart Surgery;  Laterality: N/A;  . TEE WITHOUT CARDIOVERSION N/A 12/01/2016   Procedure: TRANSESOPHAGEAL ECHOCARDIOGRAM (TEE);  Surgeon: Larey Dresser, MD;  [facility];  Service: Cardiovascular;  Laterality: N/A;  . TONSILLECTOMY  1964  . TRANSCATHETER AORTIC VALVE REPLACEMENT, TRANSFEMORAL N/A 08/31/2016   Procedure: TRANSCATHETER AORTIC VALVE REPLACEMENT, TRANSFEMORAL;  Surgeon: Burnell Blanks, MD;  Location: [place];  Service: Open Heart Surgery;  Laterality: N/A;  . TRANSURETHRAL RESECTION OF BLADDER TUMOR N/A 12/13/2017   Procedure: TRANSURETHRAL RESECTION OF BLADDER TUMOR (TURBT);  Surgeon: Irine Seal, MD;  [facility];  Service: Urology;  Laterality: N/A;    Family History  Problem Relation Age of Onset  . Stomach cancer Mother   . Hypertension Father        Died of heart attack  . Heart attack Father   . Stroke Sister   . Heart disease Sister   . Cancer Brother   . Colon cancer Neg Hx    Social History:  reports that he quit smoking about 20 years ago. His smoking use included cigarettes. He has a 90.00 pack-year smoking history. He has never used smokeless tobacco. He reports that he does not drink alcohol or use drugs.  Allergies:  Allergies  Allergen Reactions  . Penicillins Swelling  and Rash    Has patient had a PCN reaction causing immediate rash, facial/tongue/throat swelling, SOB or lightheadedness with hypotension: Yes Has patient had a PCN reaction causing severe rash involving mucus membranes or skin necrosis: No Has patient had a PCN reaction that required hospitalization: No Has patient had a PCN reaction occurring within the last 10 years: No If all of the above answers are "NO", then may proceed with Cephalosporin use.   . Atorvastatin     MYALGIA   . Codeine Nausea Only  . Tramadol Nausea Only    Medications Prior to Admission  Medication Sig Dispense Refill  . acetaminophen (TYLENOL) 650 MG CR tablet Take 650 mg by mouth every 8 (eight) hours as needed for pain.    [name] b complex-vitamin c-folic acid (NEPHRO-VITE) 0.8 MG TABS Take 1 tablet by mouth daily.     . bromocriptine (PARLODEL) 5 MG capsule Take 5 mg by mouth every other day.     . cetirizine (ZYRTEC) 10 MG tablet Take 10 mg by mouth at bedtime.     . cinacalcet (SENSIPAR) 60 MG tablet  Take 60 mg by mouth at bedtime.    . clindamycin (CLEOCIN) 150 MG capsule Take 600 mg by mouth See admin instructions. Take 600 mg 1 hour prior to dental work  0  . DIGOX 125 MCG tablet TAKE 1/2 TABLET BY MOUTH EVERY DAY (Patient taking differently: Take 0.0625 mg by mouth daily. ) 15 tablet 4  . diltiazem (CARDIZEM CD) 120 MG 24 hr capsule Take 120 mg by mouth daily.     . dorzolamide-timolol (COSOPT) 22.3-6.8 MG/ML ophthalmic solution Place 1 drop into both eyes 2 (two) times daily.     [name] enoxaparin (LOVENOX) 80 MG/0.8ML injection Inject 0.8 mLs (80 mg total) into the skin daily. (Patient taking differently: Inject 80 mg into the skin every evening. ) 10 Syringe 0  . gabapentin (NEURONTIN) 100 MG capsule Take 200 mg by mouth at bedtime.     [name] HYDROcodone-acetaminophen (NORCO/VICODIN) 5-325 MG tablet Take 1 tablet by mouth every 4 (four) hours as needed for pain.  0  . latanoprost (XALATAN) 0.005 % ophthalmic solution  Place 1 drop into both eyes at bedtime.    . Nutritional Supplements (NEPRO) LIQD Take 237 mLs by mouth every Monday, Wednesday, and Friday with hemodialysis.     [name] omeprazole (PRILOSEC) 20 MG capsule Take 20 mg by mouth daily.    . pravastatin (PRAVACHOL) 10 MG tablet Take 1 tablet (10 mg total) by mouth daily. 30 tablet 0  . predniSONE (DELTASONE) 5 MG tablet Take 5 mg by mouth at bedtime.   1  . sertraline (ZOLOFT) 50 MG tablet Take 50 mg by mouth daily.  5  . sevelamer carbonate (RENVELA) 800 MG tablet Take 2 tablets (1,600 mg total) by mouth 3 (three) times daily with meals. 90 tablet 0  . STIOLTO RESPIMAT 2.5-2.5 MCG/ACT AERS Inhale 2 puffs into the lungs daily as needed for shortness of breath.  5  . warfarin (COUMADIN) 5 MG tablet TAKE 1/2 TO 1 TABLET BY MOUTH DAILY AS DIRECTED BY COUMADIN CLINIC (Patient taking differently: Take 2.5-5 mg by mouth See admin instructions. Take 5 mg once daily in the evening on Sun, Tues, Thur, and Sat.  Take 2.5 mg once daily in the evening on Mon, Wed, and Fri) 90 tablet 1  . acetaminophen (TYLENOL) 500 MG tablet Take 1,000 mg by mouth every 6 (six) hours as needed for moderate pain.    . metoprolol tartrate (LOPRESSOR) 25 MG tablet Take 12.5 mg by mouth 2 (two) times daily as needed for high blood pressure.  0    Results for orders placed or performed during the hospital encounter of 02/16/18 (from the past 48 hour(s))  CBC     Status: Abnormal   Collection Time: 02/16/18  6:12 PM  Result Value Ref Range   WBC 7.2 4.0 - 10.5 K/uL   RBC 3.37 (L) 4.22 - 5.81 MIL/uL   Hemoglobin 10.7 (L) 13.0 - 17.0 g/dL   HCT 35.2 (L) 39.0 - 52.0 %   MCV 104.5 (H) 80.0 - 100.0 fL   MCH 31.8 26.0 - 34.0 pg   MCHC 30.4 30.0 - 36.0 g/dL   RDW 17.8 (H) 11.5 - 15.5 %   Platelets 157 150 - 400 K/uL   nRBC 0.0 0.0 - 0.2 %    Comment: Performed at [facility], [place] [address] [place],  [address]  Comprehensive metabolic panel     Status: Abnormal    Collection Time: 02/16/18  6:12 PM  Result Value  Ref Range   Sodium 140 135 - 145 mmol/L   Potassium 3.2 (L) 3.5 - 5.1 mmol/L   Chloride 95 (L) 98 - 111 mmol/L   CO2 33 (H) 22 - 32 mmol/L   Glucose, Bld 94 70 - 99 mg/dL   BUN 12 8 - 23 mg/dL   Creatinine, Ser 3.08 (H) 0.61 - 1.24 mg/dL   Calcium 8.6 (L) 8.9 - 10.3 mg/dL   Total Protein 6.5 6.5 - 8.1 g/dL   Albumin 3.4 (L) 3.5 - 5.0 g/dL   AST 20 15 - 41 U/L   ALT 23 0 - 44 U/L   Alkaline Phosphatase 55 38 - 126 U/L   Total Bilirubin 1.1 0.3 - 1.2 mg/dL   GFR calc non Af Amer 18 (L) >60 mL/min   GFR calc Af Amer 21 (L) >60 mL/min    Comment: (NOTE) The eGFR has been calculated using the CKD EPI equation. This calculation has not been validated in all clinical situations. eGFR's persistently <60 mL/min signify possible Chronic Kidney Disease.    Anion gap 12 5 - 15    Comment: Performed at [facility], [place] [address], [place], [place] [address]  Type and screen [place]     Status: None (Preliminary result)   Collection Time: 02/16/18  6:12 PM  Result Value Ref Range   ABO/RH(D) O POS    Antibody Screen NEG    Sample Expiration 02/19/2018    Unit Number [id number]    Blood Component Type RED CELLS,LR    Unit division 00    Status of Unit ALLOCATED    Transfusion Status OK TO TRANSFUSE    Crossmatch Result      Compatible Performed at [facility], [place] [address] [place], [place] [address]    Unit Number [id number]    Blood Component Type RED CELLS,LR    Unit division 00    Status of Unit ALLOCATED    Transfusion Status OK TO TRANSFUSE    Crossmatch Result Compatible   ABO/Rh     Status: None (Preliminary result)   Collection Time: 02/16/18  6:12 PM  Result Value Ref Range   ABO/RH(D)      Jenetta Downer POS Performed at [facility], [place] [address], [place], [place] [address]   Prepare RBC (crossmatch)     Status: None   Collection Time:  02/16/18  6:17 PM  Result Value Ref Range   Order Confirmation      ORDER PROCESSED BY BLOOD BANK Performed at [facility], [place] [address], [place], [place] [address]    No results found.  Review of Systems  Constitutional: Negative.  Negative for chills and fever.  HENT: Negative.   Eyes: Positive for blurred vision.  Respiratory: Negative.  Negative for cough.   Cardiovascular: Negative.  Negative for chest pain.  Gastrointestinal: Negative.  Negative for nausea and vomiting.  Genitourinary: Positive for hematuria.  Musculoskeletal: Negative.   Skin: Negative.   Neurological: Negative.   Endo/Heme/Allergies: Bruises/bleeds easily.  Psychiatric/Behavioral: Negative.     Blood pressure (!) 171/71, pulse (!) 59, temperature 98.8 F (37.1 C), temperature source Oral, resp. rate 20, height 5' 10.5" (1.791 m), weight 85.1 kg, SpO2 94 %. Physical Exam  Constitutional: He appears well-developed.  HENT:  Head: Normocephalic.  Stigmata of near blindness  Neck: Normal range of motion.  Cardiovascular: Normal rate.  Respiratory: Effort normal.  GI: Soft.  No scars.  Genitourinary:  Genitourinary Comments: No CVAT at present.   Musculoskeletal: Normal range of motion.  Neurological: He is alert.  Skin: Skin is warm.  Psychiatric: He has a normal mood and affect.     Assessment/Plan  Bowel prep, NPO p MN. We rediscussed risks, benefits, expected peri-op course. He and his famliy have good understanding of his substantial operative risk and that he is at at least 10X greater chance of ALL peri-op complications. Will plan for ICU POD 0 and likely transfer to [facility] for dialysis nad nephrology comanagmnet POD 1 pending clinical status.  Alexis Frock, MD 02/16/2018, 10:54 PM

## 2018-02-16 NOTE — Progress Notes (Signed)
Called urology office [phone] ext [phone] [facility]) to make aware of patient's arrival to unit and has no orders, received confidential VM, left message and return call back  number

## 2018-02-16 NOTE — Progress Notes (Signed)
Arrived on unit as direct admit, no orders at this time, CHG bath given,  VS obtained, oriented to unit, family at bedside, call light placed in reach

## 2018-02-17 ENCOUNTER — Observation Stay (HOSPITAL_COMMUNITY): Payer: Medicare Other | Admitting: Registered Nurse

## 2018-02-17 ENCOUNTER — Encounter (HOSPITAL_COMMUNITY)
Admission: AD | Disposition: A | Discharge status: Rehabilitation Facility | Payer: Self-pay | Source: Home / Self Care | Attending: Urology

## 2018-02-17 DIAGNOSIS — J302 Other seasonal allergic rhinitis: Secondary | ICD-10-CM | POA: Diagnosis present

## 2018-02-17 DIAGNOSIS — D500 Iron deficiency anemia secondary to blood loss (chronic): Secondary | ICD-10-CM | POA: Diagnosis not present

## 2018-02-17 DIAGNOSIS — K4091 Unilateral inguinal hernia, without obstruction or gangrene, recurrent: Secondary | ICD-10-CM | POA: Diagnosis not present

## 2018-02-17 DIAGNOSIS — C679 Malignant neoplasm of bladder, unspecified: Secondary | ICD-10-CM | POA: Diagnosis present

## 2018-02-17 DIAGNOSIS — I132 Hypertensive heart and chronic kidney disease with heart failure and with stage 5 chronic kidney disease, or end stage renal disease: Secondary | ICD-10-CM | POA: Diagnosis present

## 2018-02-17 DIAGNOSIS — I4891 Unspecified atrial fibrillation: Secondary | ICD-10-CM | POA: Diagnosis not present

## 2018-02-17 DIAGNOSIS — I480 Paroxysmal atrial fibrillation: Secondary | ICD-10-CM | POA: Diagnosis present

## 2018-02-17 DIAGNOSIS — K5730 Diverticulosis of large intestine without perforation or abscess without bleeding: Secondary | ICD-10-CM | POA: Diagnosis present

## 2018-02-17 DIAGNOSIS — H3530 Unspecified macular degeneration: Secondary | ICD-10-CM | POA: Diagnosis present

## 2018-02-17 DIAGNOSIS — G8918 Other acute postprocedural pain: Secondary | ICD-10-CM | POA: Diagnosis not present

## 2018-02-17 DIAGNOSIS — J438 Other emphysema: Secondary | ICD-10-CM | POA: Diagnosis not present

## 2018-02-17 DIAGNOSIS — D631 Anemia in chronic kidney disease: Secondary | ICD-10-CM | POA: Diagnosis present

## 2018-02-17 DIAGNOSIS — I5032 Chronic diastolic (congestive) heart failure: Secondary | ICD-10-CM | POA: Diagnosis present

## 2018-02-17 DIAGNOSIS — E1142 Type 2 diabetes mellitus with diabetic polyneuropathy: Secondary | ICD-10-CM | POA: Diagnosis present

## 2018-02-17 DIAGNOSIS — I69312 Visuospatial deficit and spatial neglect following cerebral infarction: Secondary | ICD-10-CM | POA: Diagnosis not present

## 2018-02-17 DIAGNOSIS — E875 Hyperkalemia: Secondary | ICD-10-CM | POA: Diagnosis not present

## 2018-02-17 DIAGNOSIS — E119 Type 2 diabetes mellitus without complications: Secondary | ICD-10-CM | POA: Diagnosis not present

## 2018-02-17 DIAGNOSIS — I6930 Unspecified sequelae of cerebral infarction: Secondary | ICD-10-CM | POA: Diagnosis not present

## 2018-02-17 DIAGNOSIS — R5381 Other malaise: Secondary | ICD-10-CM | POA: Diagnosis not present

## 2018-02-17 DIAGNOSIS — I509 Heart failure, unspecified: Secondary | ICD-10-CM | POA: Diagnosis present

## 2018-02-17 DIAGNOSIS — Y92230 Patient room in hospital as the place of occurrence of the external cause: Secondary | ICD-10-CM | POA: Diagnosis not present

## 2018-02-17 DIAGNOSIS — E1122 Type 2 diabetes mellitus with diabetic chronic kidney disease: Secondary | ICD-10-CM | POA: Diagnosis present

## 2018-02-17 DIAGNOSIS — I69354 Hemiplegia and hemiparesis following cerebral infarction affecting left non-dominant side: Secondary | ICD-10-CM | POA: Diagnosis not present

## 2018-02-17 DIAGNOSIS — G5603 Carpal tunnel syndrome, bilateral upper limbs: Secondary | ICD-10-CM | POA: Diagnosis present

## 2018-02-17 DIAGNOSIS — D649 Anemia, unspecified: Secondary | ICD-10-CM | POA: Diagnosis not present

## 2018-02-17 DIAGNOSIS — C678 Malignant neoplasm of overlapping sites of bladder: Secondary | ICD-10-CM | POA: Diagnosis not present

## 2018-02-17 DIAGNOSIS — Z8719 Personal history of other diseases of the digestive system: Secondary | ICD-10-CM | POA: Diagnosis not present

## 2018-02-17 DIAGNOSIS — R0902 Hypoxemia: Secondary | ICD-10-CM | POA: Diagnosis not present

## 2018-02-17 DIAGNOSIS — H409 Unspecified glaucoma: Secondary | ICD-10-CM | POA: Diagnosis present

## 2018-02-17 DIAGNOSIS — K4090 Unilateral inguinal hernia, without obstruction or gangrene, not specified as recurrent: Secondary | ICD-10-CM | POA: Diagnosis present

## 2018-02-17 DIAGNOSIS — E785 Hyperlipidemia, unspecified: Secondary | ICD-10-CM | POA: Diagnosis present

## 2018-02-17 DIAGNOSIS — I482 Chronic atrial fibrillation, unspecified: Secondary | ICD-10-CM | POA: Diagnosis not present

## 2018-02-17 DIAGNOSIS — Z7901 Long term (current) use of anticoagulants: Secondary | ICD-10-CM | POA: Diagnosis not present

## 2018-02-17 DIAGNOSIS — R001 Bradycardia, unspecified: Secondary | ICD-10-CM | POA: Diagnosis not present

## 2018-02-17 DIAGNOSIS — T797XXD Traumatic subcutaneous emphysema, subsequent encounter: Secondary | ICD-10-CM | POA: Diagnosis not present

## 2018-02-17 DIAGNOSIS — I2510 Atherosclerotic heart disease of native coronary artery without angina pectoris: Secondary | ICD-10-CM | POA: Diagnosis present

## 2018-02-17 DIAGNOSIS — I69311 Memory deficit following cerebral infarction: Secondary | ICD-10-CM | POA: Diagnosis not present

## 2018-02-17 DIAGNOSIS — D638 Anemia in other chronic diseases classified elsewhere: Secondary | ICD-10-CM | POA: Diagnosis not present

## 2018-02-17 DIAGNOSIS — N2581 Secondary hyperparathyroidism of renal origin: Secondary | ICD-10-CM | POA: Diagnosis present

## 2018-02-17 DIAGNOSIS — I9581 Postprocedural hypotension: Secondary | ICD-10-CM | POA: Diagnosis not present

## 2018-02-17 DIAGNOSIS — Z950 Presence of cardiac pacemaker: Secondary | ICD-10-CM | POA: Diagnosis not present

## 2018-02-17 DIAGNOSIS — I959 Hypotension, unspecified: Secondary | ICD-10-CM | POA: Diagnosis not present

## 2018-02-17 DIAGNOSIS — Z992 Dependence on renal dialysis: Secondary | ICD-10-CM | POA: Diagnosis not present

## 2018-02-17 DIAGNOSIS — K5790 Diverticulosis of intestine, part unspecified, without perforation or abscess without bleeding: Secondary | ICD-10-CM | POA: Diagnosis present

## 2018-02-17 DIAGNOSIS — K2270 Barrett's esophagus without dysplasia: Secondary | ICD-10-CM | POA: Diagnosis present

## 2018-02-17 DIAGNOSIS — I120 Hypertensive chronic kidney disease with stage 5 chronic kidney disease or end stage renal disease: Secondary | ICD-10-CM | POA: Diagnosis not present

## 2018-02-17 DIAGNOSIS — N186 End stage renal disease: Secondary | ICD-10-CM | POA: Diagnosis not present

## 2018-02-17 DIAGNOSIS — K219 Gastro-esophageal reflux disease without esophagitis: Secondary | ICD-10-CM | POA: Diagnosis present

## 2018-02-17 DIAGNOSIS — H548 Legal blindness, as defined in USA: Secondary | ICD-10-CM | POA: Diagnosis present

## 2018-02-17 DIAGNOSIS — D62 Acute posthemorrhagic anemia: Secondary | ICD-10-CM | POA: Diagnosis not present

## 2018-02-17 DIAGNOSIS — Z87442 Personal history of urinary calculi: Secondary | ICD-10-CM | POA: Diagnosis not present

## 2018-02-17 HISTORY — PX: ROBOT ASSITED LAPAROSCOPIC NEPHROURETERECTOMY: SHX6077

## 2018-02-17 LAB — POCT I-STAT 7, (LYTES, BLD GAS, ICA,H+H)
ACID-BASE EXCESS: 5 mmol/L — AB (ref 0.0–2.0)
Acid-Base Excess: 3 mmol/L — ABNORMAL HIGH (ref 0.0–2.0)
Acid-Base Excess: 2 mmol/L (ref 0.0–2.0)
BICARBONATE: 31.6 mmol/L — AB (ref 20.0–28.0)
Bicarbonate: 28.6 mmol/L — ABNORMAL HIGH (ref 20.0–28.0)
Bicarbonate: 29.5 mmol/L — ABNORMAL HIGH (ref 20.0–28.0)
CALCIUM ION: 1.17 mmol/L (ref 1.15–1.40)
Calcium, Ion: 1.16 mmol/L (ref 1.15–1.40)
Calcium, Ion: 1.16 mmol/L (ref 1.15–1.40)
HCT: 33 % — ABNORMAL LOW (ref 39.0–52.0)
HCT: 32 % — ABNORMAL LOW (ref 39.0–52.0)
HEMATOCRIT: 30 % — AB (ref 39.0–52.0)
HEMOGLOBIN: 10.2 g/dL — AB (ref 13.0–17.0)
Hemoglobin: 11.2 g/dL — ABNORMAL LOW (ref 13.0–17.0)
Hemoglobin: 10.9 g/dL — ABNORMAL LOW (ref 13.0–17.0)
O2 SAT: 100 %
O2 SAT: 99 %
O2 Saturation: 99 %
PCO2 ART: 48.4 mmHg — AB (ref 32.0–48.0)
PCO2 ART: 57.9 mmHg — AB (ref 32.0–48.0)
PH ART: 7.382 (ref 7.350–7.450)
POTASSIUM: 3.6 mmol/L (ref 3.5–5.1)
POTASSIUM: 3.8 mmol/L (ref 3.5–5.1)
Potassium: 3.6 mmol/L (ref 3.5–5.1)
SODIUM: 135 mmol/L (ref 135–145)
Sodium: 136 mmol/L (ref 135–145)
Sodium: 137 mmol/L (ref 135–145)
TCO2: 33 mmol/L — AB (ref 22–32)
TCO2: 30 mmol/L (ref 22–32)
TCO2: 31 mmol/L (ref 22–32)
pCO2 arterial: 53.3 mmHg — ABNORMAL HIGH (ref 32.0–48.0)
pH, Arterial: 7.38 (ref 7.350–7.450)
pH, Arterial: 7.314 — ABNORMAL LOW (ref 7.350–7.450)
pO2, Arterial: 203 mmHg — ABNORMAL HIGH (ref 83.0–108.0)
pO2, Arterial: 129 mmHg — ABNORMAL HIGH (ref 83.0–108.0)
pO2, Arterial: 137 mmHg — ABNORMAL HIGH (ref 83.0–108.0)

## 2018-02-17 LAB — BASIC METABOLIC PANEL
ANION GAP: 13 (ref 5–15)
Anion gap: 11 (ref 5–15)
BUN: 20 mg/dL (ref 8–23)
BUN: 25 mg/dL — AB (ref 8–23)
CHLORIDE: 108 mmol/L (ref 98–111)
CO2: 24 mmol/L (ref 22–32)
CO2: 27 mmol/L (ref 22–32)
Calcium: 7.1 mg/dL — ABNORMAL LOW (ref 8.9–10.3)
Calcium: 8.5 mg/dL — ABNORMAL LOW (ref 8.9–10.3)
Chloride: 98 mmol/L (ref 98–111)
Creatinine, Ser: 3.81 mg/dL — ABNORMAL HIGH (ref 0.61–1.24)
Creatinine, Ser: 5.18 mg/dL — ABNORMAL HIGH (ref 0.61–1.24)
GFR calc Af Amer: 16 mL/min — ABNORMAL LOW (ref >60)
GFR calc Af Amer: 11 mL/min — ABNORMAL LOW (ref >60)
GFR calc non Af Amer: 14 mL/min — ABNORMAL LOW (ref >60)
GFR, EST NON AFRICAN AMERICAN: 10 mL/min — AB (ref >60)
GLUCOSE: 181 mg/dL — AB (ref 70–99)
Glucose, Bld: 211 mg/dL — ABNORMAL HIGH (ref 70–99)
POTASSIUM: 3.2 mmol/L — AB (ref 3.5–5.1)
POTASSIUM: 4.4 mmol/L (ref 3.5–5.1)
SODIUM: 138 mmol/L (ref 135–145)
Sodium: 143 mmol/L (ref 135–145)

## 2018-02-17 LAB — ABO/RH: ABO/RH(D): O POS

## 2018-02-17 LAB — MRSA PCR SCREENING: MRSA BY PCR: NEGATIVE

## 2018-02-17 LAB — HEMOGLOBIN AND HEMATOCRIT, BLOOD
HCT: 28.7 % — ABNORMAL LOW (ref 39.0–52.0)
HEMATOCRIT: 27.6 % — AB (ref 39.0–52.0)
HEMOGLOBIN: 8.5 g/dL — AB (ref 13.0–17.0)
Hemoglobin: 8.4 g/dL — ABNORMAL LOW (ref 13.0–17.0)

## 2018-02-17 LAB — GLUCOSE, CAPILLARY: Glucose-Capillary: 118 mg/dL — ABNORMAL HIGH (ref 70–99)

## 2018-02-17 SURGERY — ROBOT ASSISTED LAPAROSCOPIC RADICAL CYSTOPROSTATECTOMY BILATERAL PELVIC LYMPHADENECTOMY,ORTHOTOPIC NEOBLADDER
Anesthesia: General | Laterality: Bilateral

## 2018-02-17 MED ORDER — LACTATED RINGERS IV SOLN
INTRAVENOUS | Status: DC
  Administered 2018-02-17: 07:00:00 via INTRAVENOUS

## 2018-02-17 MED ORDER — LACTATED RINGERS IV SOLN: INTRAVENOUS | Status: DC

## 2018-02-17 MED ORDER — OXYCODONE HCL 5 MG PO TABS
5.0000 mg | ORAL_TABLET | ORAL | Status: DC | PRN
  Administered 2018-02-18 – 2018-02-20 (×6): 5 mg via ORAL
  Filled 2018-02-17 (×7): qty 1

## 2018-02-17 MED ORDER — LIDOCAINE 2% (20 MG/ML) 5 ML SYRINGE
INTRAMUSCULAR | Status: AC
  Filled 2018-02-17: qty 5

## 2018-02-17 MED ORDER — BUPIVACAINE LIPOSOME 1.3 % IJ SUSP
20.0000 mL | Freq: Once | INTRAMUSCULAR | Status: AC
  Administered 2018-02-17: 20 mL
  Filled 2018-02-17: qty 20

## 2018-02-17 MED ORDER — ESMOLOL HCL 100 MG/10ML IV SOLN
INTRAVENOUS | Status: AC
  Filled 2018-02-17: qty 10

## 2018-02-17 MED ORDER — CISATRACURIUM BESYLATE 20 MG/10ML IV SOLN
INTRAVENOUS | Status: AC
  Filled 2018-02-17: qty 20

## 2018-02-17 MED ORDER — ACETAMINOPHEN 10 MG/ML IV SOLN
1000.0000 mg | Freq: Four times a day (QID) | INTRAVENOUS | Status: AC
  Administered 2018-02-17 – 2018-02-18 (×4): 1000 mg via INTRAVENOUS
  Filled 2018-02-17 (×4): qty 100

## 2018-02-17 MED ORDER — HYDROMORPHONE HCL 1 MG/ML IJ SOLN
0.5000 mg | INTRAMUSCULAR | Status: DC | PRN
  Administered 2018-02-17: 0.5 mg via INTRAVENOUS
  Filled 2018-02-17: qty 1

## 2018-02-17 MED ORDER — INSULIN ASPART 100 UNIT/ML ~~LOC~~ SOLN
0.0000 [IU] | Freq: Three times a day (TID) | SUBCUTANEOUS | Status: DC
  Administered 2018-02-18: 0 [IU] via SUBCUTANEOUS

## 2018-02-17 MED ORDER — ALBUMIN HUMAN 5 % IV SOLN
INTRAVENOUS | Status: AC
  Filled 2018-02-17: qty 250

## 2018-02-17 MED ORDER — HYDRALAZINE HCL 20 MG/ML IJ SOLN
5.0000 mg | INTRAMUSCULAR | Status: DC | PRN
  Administered 2018-02-17: 5 mg via INTRAVENOUS

## 2018-02-17 MED ORDER — NEOSTIGMINE METHYLSULFATE 3 MG/3ML IV SOSY
PREFILLED_SYRINGE | INTRAVENOUS | Status: AC
  Filled 2018-02-17: qty 6

## 2018-02-17 MED ORDER — SODIUM CHLORIDE 0.9 % IV SOLN
INTRAVENOUS | Status: DC | PRN
  Administered 2018-02-17: 1 ug/min via INTRAVENOUS

## 2018-02-17 MED ORDER — ONDANSETRON HCL 4 MG/2ML IJ SOLN: 4.0000 mg | INTRAMUSCULAR | Status: DC | PRN

## 2018-02-17 MED ORDER — PHENYLEPHRINE HCL 10 MG/ML IJ SOLN
INTRAMUSCULAR | Status: AC
  Filled 2018-02-17: qty 2

## 2018-02-17 MED ORDER — CLINDAMYCIN PHOSPHATE 900 MG/50ML IV SOLN
INTRAVENOUS | Status: AC
  Filled 2018-02-17: qty 50

## 2018-02-17 MED ORDER — ONDANSETRON HCL 4 MG/2ML IJ SOLN
INTRAMUSCULAR | Status: DC | PRN
  Administered 2018-02-17: 4 mg via INTRAVENOUS

## 2018-02-17 MED ORDER — GLYCOPYRROLATE PF 0.2 MG/ML IJ SOSY
PREFILLED_SYRINGE | INTRAMUSCULAR | Status: DC | PRN
  Administered 2018-02-17: .2 mg via INTRAVENOUS

## 2018-02-17 MED ORDER — SODIUM CHLORIDE (PF) 0.9 % IJ SOLN
INTRAMUSCULAR | Status: DC | PRN
  Administered 2018-02-17: 20 mL

## 2018-02-17 MED ORDER — SODIUM CHLORIDE 0.9 % IV BOLUS
250.0000 mL | Freq: Once | INTRAVENOUS | Status: AC
  Administered 2018-02-17: 250 mL via INTRAVENOUS

## 2018-02-17 MED ORDER — PROPOFOL 10 MG/ML IV BOLUS
INTRAVENOUS | Status: AC
  Filled 2018-02-17: qty 20

## 2018-02-17 MED ORDER — SODIUM CHLORIDE (PF) 0.9 % IJ SOLN
INTRAMUSCULAR | Status: AC
  Filled 2018-02-17: qty 50

## 2018-02-17 MED ORDER — NEOSTIGMINE METHYLSULFATE 5 MG/5ML IV SOSY
PREFILLED_SYRINGE | INTRAVENOUS | Status: DC | PRN
  Administered 2018-02-17: 4 mg via INTRAVENOUS

## 2018-02-17 MED ORDER — SODIUM CHLORIDE 0.9 % NICU IV INFUSION SIMPLE: 250.0000 mL | INJECTION | Freq: Once | INTRAVENOUS | Status: DC

## 2018-02-17 MED ORDER — PROPOFOL 10 MG/ML IV BOLUS
INTRAVENOUS | Status: DC | PRN
  Administered 2018-02-17: 140 mg via INTRAVENOUS

## 2018-02-17 MED ORDER — SODIUM CHLORIDE 0.45 % IV SOLN
INTRAVENOUS | Status: DC
  Administered 2018-02-17: 20:00:00 via INTRAVENOUS

## 2018-02-17 MED ORDER — DIPHENHYDRAMINE HCL 12.5 MG/5ML PO ELIX
12.5000 mg | ORAL_SOLUTION | Freq: Four times a day (QID) | ORAL | Status: DC | PRN
  Filled 2018-02-17: qty 5

## 2018-02-17 MED ORDER — FENTANYL CITRATE (PF) 100 MCG/2ML IJ SOLN
INTRAMUSCULAR | Status: AC
  Filled 2018-02-17: qty 2

## 2018-02-17 MED ORDER — SODIUM CHLORIDE (PF) 0.9 % IJ SOLN
INTRAMUSCULAR | Status: AC
  Filled 2018-02-17: qty 20

## 2018-02-17 MED ORDER — ALBUMIN HUMAN 5 % IV SOLN
INTRAVENOUS | Status: DC | PRN
  Administered 2018-02-17 (×2): via INTRAVENOUS

## 2018-02-17 MED ORDER — ESMOLOL HCL 100 MG/10ML IV SOLN
INTRAVENOUS | Status: DC | PRN
  Administered 2018-02-17: 30 mg via INTRAVENOUS

## 2018-02-17 MED ORDER — FENTANYL CITRATE (PF) 100 MCG/2ML IJ SOLN
INTRAMUSCULAR | Status: DC | PRN
  Administered 2018-02-17 (×2): 50 ug via INTRAVENOUS
  Administered 2018-02-17: 100 ug via INTRAVENOUS
  Administered 2018-02-17: 50 ug via INTRAVENOUS
  Administered 2018-02-17: 100 ug via INTRAVENOUS

## 2018-02-17 MED ORDER — HYDRALAZINE HCL 20 MG/ML IJ SOLN
INTRAMUSCULAR | Status: AC
  Filled 2018-02-17: qty 1

## 2018-02-17 MED ORDER — DIPHENHYDRAMINE HCL 50 MG/ML IJ SOLN: 12.5000 mg | Freq: Four times a day (QID) | INTRAMUSCULAR | Status: DC | PRN

## 2018-02-17 MED ORDER — SODIUM CHLORIDE 0.9 % IV SOLN
INTRAVENOUS | Status: DC
  Administered 2018-02-17: 07:00:00 via INTRAVENOUS

## 2018-02-17 MED ORDER — LIDOCAINE 2% (20 MG/ML) 5 ML SYRINGE
INTRAMUSCULAR | Status: DC | PRN
  Administered 2018-02-17: 60 mg via INTRAVENOUS

## 2018-02-17 MED ORDER — CISATRACURIUM BESYLATE (PF) 10 MG/5ML IV SOLN
INTRAVENOUS | Status: DC | PRN
  Administered 2018-02-17: 4 mg via INTRAVENOUS
  Administered 2018-02-17: 20 mg via INTRAVENOUS
  Administered 2018-02-17 (×2): 8 mg via INTRAVENOUS
  Administered 2018-02-17: 12 mg via INTRAVENOUS
  Administered 2018-02-17: 4 mg via INTRAVENOUS
  Administered 2018-02-17: 8 mg via INTRAVENOUS
  Administered 2018-02-17: 4 mg via INTRAVENOUS
  Administered 2018-02-17: 10 mg via INTRAVENOUS

## 2018-02-17 MED ORDER — DEXAMETHASONE SODIUM PHOSPHATE 10 MG/ML IJ SOLN
INTRAMUSCULAR | Status: DC | PRN
  Administered 2018-02-17: 10 mg via INTRAVENOUS

## 2018-02-17 MED ORDER — ONDANSETRON HCL 4 MG/2ML IJ SOLN: 4.0000 mg | Freq: Once | INTRAMUSCULAR | Status: DC | PRN

## 2018-02-17 MED ORDER — GLYCOPYRROLATE PF 0.2 MG/ML IJ SOSY
PREFILLED_SYRINGE | INTRAMUSCULAR | Status: AC
  Filled 2018-02-17: qty 2

## 2018-02-17 MED ORDER — HYDRALAZINE HCL 20 MG/ML IJ SOLN
INTRAMUSCULAR | Status: AC
  Administered 2018-02-17: 5 mg via INTRAVENOUS
  Filled 2018-02-17: qty 1

## 2018-02-17 MED ORDER — ONDANSETRON HCL 4 MG/2ML IJ SOLN
INTRAMUSCULAR | Status: AC
  Filled 2018-02-17: qty 2

## 2018-02-17 MED ORDER — FENTANYL CITRATE (PF) 250 MCG/5ML IJ SOLN
INTRAMUSCULAR | Status: AC
  Filled 2018-02-17: qty 5

## 2018-02-17 MED ORDER — FENTANYL CITRATE (PF) 100 MCG/2ML IJ SOLN: 25.0000 ug | INTRAMUSCULAR | Status: DC | PRN

## 2018-02-17 MED ORDER — DEXAMETHASONE SODIUM PHOSPHATE 10 MG/ML IJ SOLN
INTRAMUSCULAR | Status: AC
  Filled 2018-02-17: qty 1

## 2018-02-17 MED ORDER — CISATRACURIUM BESYLATE 20 MG/10ML IV SOLN
INTRAVENOUS | Status: AC
  Filled 2018-02-17: qty 10

## 2018-02-17 MED ORDER — SODIUM CHLORIDE 0.9 % IV BOLUS
500.0000 mL | Freq: Once | INTRAVENOUS | Status: AC
  Administered 2018-02-17: 500 mL via INTRAVENOUS

## 2018-02-17 SURGICAL SUPPLY — 110 items
APPLICATOR COTTON TIP 6 STRL (MISCELLANEOUS) ×1 IMPLANT
APPLICATOR COTTON TIP 6IN STRL (MISCELLANEOUS) ×2
APPLICATOR SURGIFLO ENDO (HEMOSTASIS) IMPLANT
BAG LAPAROSCOPIC 12 15 PORT 16 (BASKET) ×3 IMPLANT
BAG RETRIEVAL 12/15 (BASKET) ×6
BLADE SURG SZ10 CARB STEEL (BLADE) IMPLANT
CATH FOLEY 2WAY SLVR  5CC 16FR (CATHETERS) ×2
CATH FOLEY 2WAY SLVR 18FR 30CC (CATHETERS) IMPLANT
CATH FOLEY 2WAY SLVR 5CC 16FR (CATHETERS) ×2 IMPLANT
CELLS DAT CNTRL 66122 CELL SVR (MISCELLANEOUS) IMPLANT
CHLORAPREP W/TINT 26ML (MISCELLANEOUS) ×6 IMPLANT
CLIP VESOLOCK LG 6/CT PURPLE (CLIP) ×2 IMPLANT
CLIP VESOLOCK MED LG 6/CT (CLIP) ×2 IMPLANT
CLIP VESOLOCK XL 6/CT (CLIP) ×6 IMPLANT
COVER SURGICAL LIGHT HANDLE (MISCELLANEOUS) ×6 IMPLANT
COVER TIP SHEARS 8 DVNC (MISCELLANEOUS) ×2 IMPLANT
COVER TIP SHEARS 8MM DA VINCI (MISCELLANEOUS) ×2
COVER WAND RF STERILE (DRAPES) ×2 IMPLANT
DECANTER SPIKE VIAL GLASS SM (MISCELLANEOUS) ×2 IMPLANT
DERMABOND ADVANCED (GAUZE/BANDAGES/DRESSINGS) ×1
DERMABOND ADVANCED .7 DNX12 (GAUZE/BANDAGES/DRESSINGS) ×1 IMPLANT
DRAIN CHANNEL 15F RND FF 3/16 (WOUND CARE) ×2 IMPLANT
DRAIN CHANNEL RND F F (WOUND CARE) IMPLANT
DRAIN PENROSE 18X1/2 LTX STRL (DRAIN) IMPLANT
DRAPE ARM DVNC X/XI (DISPOSABLE) ×4 IMPLANT
DRAPE COLUMN DVNC XI (DISPOSABLE) ×1 IMPLANT
DRAPE DA VINCI XI ARM (DISPOSABLE) ×4
DRAPE DA VINCI XI COLUMN (DISPOSABLE) ×1
DRAPE INCISE IOBAN 66X45 STRL (DRAPES) ×10 IMPLANT
DRAPE LAPAROSCOPIC ABDOMINAL (DRAPES) ×4 IMPLANT
DRAPE SHEET LG 3/4 BI-LAMINATE (DRAPES) ×2 IMPLANT
DRSG TEGADERM 4X4.75 (GAUZE/BANDAGES/DRESSINGS) ×4 IMPLANT
ELECT CAUTERY BLADE 6.4 (BLADE) IMPLANT
ELECT PENCIL ROCKER SW 15FT (MISCELLANEOUS) ×4 IMPLANT
ELECT REM PT RETURN 15FT ADLT (MISCELLANEOUS) ×6 IMPLANT
EVACUATOR SILICONE 100CC (DRAIN) IMPLANT
GLOVE BIO SURGEON STRL SZ 6.5 (GLOVE) ×18 IMPLANT
GLOVE BIOGEL M STRL SZ7.5 (GLOVE) ×12 IMPLANT
GLOVE BIOGEL PI IND STRL 7.0 (GLOVE) ×2 IMPLANT
GLOVE BIOGEL PI IND STRL 7.5 (GLOVE) ×1 IMPLANT
GLOVE BIOGEL PI INDICATOR 7.0 (GLOVE) ×2
GLOVE BIOGEL PI INDICATOR 7.5 (GLOVE) ×1
GOWN STRL REUS W/TWL LRG LVL3 (GOWN DISPOSABLE) ×30 IMPLANT
IRRIG SUCT STRYKERFLOW 2 WTIP (MISCELLANEOUS) ×6
IRRIGATION SUCT STRKRFLW 2 WTP (MISCELLANEOUS) ×3 IMPLANT
IV LACTATED RINGERS 1000ML (IV SOLUTION) ×6 IMPLANT
KIT BASIN OR (CUSTOM PROCEDURE TRAY) ×2 IMPLANT
KIT PROCEDURE DA VINCI SI (MISCELLANEOUS)
KIT PROCEDURE DVNC SI (MISCELLANEOUS) IMPLANT
LOOP VESSEL MAXI BLUE (MISCELLANEOUS) ×2 IMPLANT
MARKER SKIN DUAL TIP RULER LAB (MISCELLANEOUS) ×2 IMPLANT
NEEDLE INSUFFLATION 14GA 120MM (NEEDLE) ×2 IMPLANT
NS IRRIG 1000ML POUR BTL (IV SOLUTION) IMPLANT
PACK ROBOT UROLOGY CUSTOM (CUSTOM PROCEDURE TRAY) ×2 IMPLANT
PAD POSITIONING PINK XL (MISCELLANEOUS) ×2 IMPLANT
PORT ACCESS TROCAR AIRSEAL 12 (TROCAR) ×1 IMPLANT
PORT ACCESS TROCAR AIRSEAL 5M (TROCAR) ×1
PROTECTOR NERVE ULNAR (MISCELLANEOUS) ×10 IMPLANT
RELOAD STAPLER GREEN 60MM (STAPLE) ×1 IMPLANT
RELOAD STAPLER WHITE 60MM (STAPLE) ×9 IMPLANT
RETRACTOR LONRSTAR 16.6X16.6CM (MISCELLANEOUS) IMPLANT
RETRACTOR STAY HOOK 5MM (MISCELLANEOUS) IMPLANT
RETRACTOR STER APS 16.6X16.6CM (MISCELLANEOUS)
RTRCTR WOUND ALEXIS 18CM MED (MISCELLANEOUS)
SEAL CANN UNIV 5-8 DVNC XI (MISCELLANEOUS) ×4 IMPLANT
SEAL XI 5MM-8MM UNIVERSAL (MISCELLANEOUS) ×4
SET TRI-LUMEN FLTR TB AIRSEAL (TUBING) ×6 IMPLANT
SOLUTION ELECTROLUBE (MISCELLANEOUS) ×2 IMPLANT
SPONGE LAP 18X18 RF (DISPOSABLE) ×2 IMPLANT
SPONGE LAP 4X18 RFD (DISPOSABLE) ×2 IMPLANT
STAPLE ECHEON FLEX 60 POW ENDO (STAPLE) ×2 IMPLANT
STAPLER ECHELON LONG 60 440 (INSTRUMENTS) IMPLANT
STAPLER RELOAD GREEN 60MM (STAPLE) ×2
STAPLER RELOAD WHITE 60MM (STAPLE) ×18
STENT SET URETHERAL LEFT 7FR (STENTS) IMPLANT
STENT SET URETHERAL RIGHT 7FR (STENTS) IMPLANT
SURGIFLO W/THROMBIN 8M KIT (HEMOSTASIS) IMPLANT
SUT CHROMIC 4 0 RB 1X27 (SUTURE) IMPLANT
SUT ETHIBOND 0 (SUTURE) ×4 IMPLANT
SUT ETHILON 3 0 PS 1 (SUTURE) ×6 IMPLANT
SUT MNCRL AB 4-0 PS2 18 (SUTURE) ×6 IMPLANT
SUT NOVA NAB GS-21 0 18 T12 DT (SUTURE) ×4 IMPLANT
SUT PDS AB 0 CTX 36 PDP370T (SUTURE) IMPLANT
SUT PDS AB 1 CT1 27 (SUTURE) ×6 IMPLANT
SUT SILK 3 0 SH 30 (SUTURE) ×2 IMPLANT
SUT SILK 3 0 SH CR/8 (SUTURE) IMPLANT
SUT VIC AB 2-0 SH 18 (SUTURE) IMPLANT
SUT VIC AB 2-0 SH 27 (SUTURE) ×1
SUT VIC AB 2-0 SH 27X BRD (SUTURE) ×1 IMPLANT
SUT VIC AB 2-0 UR5 27 (SUTURE) IMPLANT
SUT VIC AB 3-0 SH 27 (SUTURE)
SUT VIC AB 3-0 SH 27X BRD (SUTURE) IMPLANT
SUT VIC AB 3-0 SH 27XBRD (SUTURE) IMPLANT
SUT VIC AB 4-0 RB1 27 (SUTURE)
SUT VIC AB 4-0 RB1 27XBRD (SUTURE) IMPLANT
SUT VICRYL 0 UR6 27IN ABS (SUTURE) ×2 IMPLANT
SUT VLOC BARB 180 ABS3/0GR12 (SUTURE) ×2
SUTURE VLOC BRB 180 ABS3/0GR12 (SUTURE) ×1 IMPLANT
SYRINGE 20CC LL (MISCELLANEOUS) ×2 IMPLANT
SYSTEM UROSTOMY GENTLE TOUCH (WOUND CARE) IMPLANT
TOWEL OR 17X26 10 PK STRL BLUE (TOWEL DISPOSABLE) ×2 IMPLANT
TOWEL OR NON WOVEN STRL DISP B (DISPOSABLE) ×2 IMPLANT
TRAY FOLEY MTR SLVR 16FR STAT (SET/KITS/TRAYS/PACK) ×2 IMPLANT
TRAY LAPAROSCOPIC (CUSTOM PROCEDURE TRAY) ×2 IMPLANT
TROCAR BLADELESS 15MM (ENDOMECHANICALS) IMPLANT
TROCAR BLADELESS OPT 5 100 (ENDOMECHANICALS) IMPLANT
TROCAR UNIVERSAL OPT 12M 100M (ENDOMECHANICALS) ×2 IMPLANT
TROCAR XCEL 12X100 BLDLESS (ENDOMECHANICALS) ×2 IMPLANT
WATER STERILE IRR 1000ML POUR (IV SOLUTION) ×2 IMPLANT
YANKAUER SUCT BULB TIP 10FT TU (MISCELLANEOUS) ×2 IMPLANT

## 2018-02-17 NOTE — Progress Notes (Signed)
MRSA swab done @ 2300. Lab says they didn't receive. New swab sent @0545 .

## 2018-02-17 NOTE — Anesthesia Preprocedure Evaluation (Addendum)
Anesthesia Evaluation  Patient identified by MRN, date of birth, ID band Patient awake    Reviewed: Allergy & Precautions, NPO status , Patient's Chart, lab work & pertinent test results  History of Anesthesia Complications (+) PONV and history of anesthetic complications  Airway Mallampati: II  TM Distance: >3 FB Neck ROM: Full    Dental no notable dental hx.    Pulmonary former smoker,    Pulmonary exam normal breath sounds clear to auscultation       Cardiovascular hypertension, + CAD and +CHF  Normal cardiovascular exam+ dysrhythmias Atrial Fibrillation + pacemaker + Valvular Problems/Murmurs AS  Rhythm:Regular Rate:Normal  ECG: V-paced, rate 63  ECHO: Left ventricle: The cavity size was normal. Wall thickness was increased in a pattern of moderate LVH. Systolic function was normal. The estimated ejection fraction was in the range of 60% to 65%. Wall motion was normal; there were no regional wall motion abnormalities. Aortic valve: A bioprosthesis was present and functioning normally. The prosthesis had a normal range of motion. Valve area (VTI): 1.75 cm^2. Valve area (Vmax): 1.77 cm^2. Mitral valve: Mildly to moderately calcified annulus. The findings are consistent with mild stenosis. Valve area by pressure half-time: 1.52 cm^2. Valve area by continuity equation (using LVOT flow): 1.1 cm^2. Left atrium: The atrium was moderately to severely dilated. Right atrium: The atrium was moderately dilated. Pulmonary arteries: Systolic pressure was moderately increased. PA peak pressure: 51 mm Hg (S).  Notes recorded by Constance Haw, MD on 11/30/2017 at 8:30 AM EDT Normal remote reviewed. Battery and lead parameters stable.  Pre-op eval per cardiology Oval Linsey)   Neuro/Psych PSYCHIATRIC DISORDERS Depression Left sided weakness CVA, Residual Symptoms    GI/Hepatic Neg liver ROS, PUD, GERD  Medicated and Controlled,   Endo/Other  diabetes  Renal/GU Dialysis and ESRFRenal diseaseHD on M, W, F     Musculoskeletal negative musculoskeletal ROS (+)   Abdominal   Peds  Hematology  (+) Blood dyscrasia, anemia , HLD   Anesthesia Other Findings BLADDER CANCER END STAGE RENAL DISEASE  Reproductive/Obstetrics                           Anesthesia Physical Anesthesia Plan  ASA: IV  Anesthesia Plan: General   Post-op Pain Management:    Induction: Intravenous  PONV Risk Score and Plan: 3 and Dexamethasone, Ondansetron and Treatment may vary due to age or medical condition  Airway Management Planned: Oral ETT  Additional Equipment: Arterial line  Intra-op Plan:   Post-operative Plan: Extubation in OR and Possible Post-op intubation/ventilation  Informed Consent: I have reviewed the patients History and Physical, chart, labs and discussed the procedure including the risks, benefits and alternatives for the proposed anesthesia with the patient or authorized representative who has indicated his/her understanding and acceptance.   Dental advisory given  Plan Discussed with: CRNA  Anesthesia Plan Comments: (Possible central line placement discussed with patient and family)       Anesthesia Quick Evaluation

## 2018-02-17 NOTE — Plan of Care (Signed)
Pt stated that he trusts the dr and is not anxious regarding his upcoming surgery. Pt's bowel prep was successful.

## 2018-02-17 NOTE — Anesthesia Procedure Notes (Signed)
Procedure Name: Intubation Date/Time: 02/17/2018 7:49 AM Performed by: Catalina Gravel, MD Pre-anesthesia Checklist: Patient identified, Emergency Drugs available, Suction available and Patient being monitored Patient Re-evaluated:Patient Re-evaluated prior to induction Oxygen Delivery Method: Circle system utilized Preoxygenation: Pre-oxygenation with 100% oxygen Induction Type: IV induction Ventilation: Mask ventilation without difficulty Laryngoscope Size: Mac and 4 Grade View: Grade II Tube type: Oral Tube size: 7.5 mm Number of attempts: 2 Airway Equipment and Method: Stylet Placement Confirmation: ETT inserted through vocal cords under direct vision,  positive ETCO2 and breath sounds checked- equal and bilateral Secured at: 23 cm Tube secured with: Tape Dental Injury: Teeth and Oropharynx as per pre-operative assessment  Comments: Attempt x1 by CRNA with good view, but unable to pass ETT through glottic opening.  Attempt x1 by MDA with Grade II view, ETT passed gently through glottic opening without stylet.  +BBS, +Chest rise, +EtCO2.

## 2018-02-17 NOTE — Anesthesia Procedure Notes (Signed)
Arterial Line Insertion Start/End11/22/2019 8:00 AM, 02/17/2018 8:10 AM Performed by: Murvin Natal, MD, anesthesiologist  Patient location: OR. Preanesthetic checklist: patient identified, IV checked, site marked, risks and benefits discussed, surgical consent, monitors and equipment checked, pre-op evaluation, timeout performed and anesthesia consent Lidocaine 1% used for infiltration Right, radial was placed Catheter size: 20 Fr Hand hygiene performed  and maximum sterile barriers used   Attempts: 1 Procedure performed using ultrasound guided technique. Following insertion, dressing applied and Biopatch. Post procedure assessment: normal and unchanged  Patient tolerated the procedure well with no immediate complications. Additional procedure comments: Previous unsuccessful attempt by CRNA.

## 2018-02-17 NOTE — Brief Op Note (Signed)
02/16/2018 - 02/17/2018  3:15 PM  PATIENT:  Alexander [name]  77 y.o. male  PRE-OPERATIVE DIAGNOSIS:  BLADDER CANCER, END STAGE RENAL DISEASE  POST-OPERATIVE DIAGNOSIS:  BLADDER CANCER, END STAGE RENAL DISEASE  PROCEDURE:  Procedure(s): ROBOT ASSISTED LAPAROSCOPIC RADICAL CYSTOPROSTATECTOMY WITH BILATERAL PELVIC LYMPHADECTOMY (Bilateral) XI ROBOT ASSITED LAPAROSCOPIC NEPHROURETERECTOMY (Bilateral)  SURGEON:  Surgeon(s) and Role:    Alexis Frock, MD - Primary  PHYSICIAN ASSISTANT:   ASSISTANTS: Debbrah Alar   ANESTHESIA:   local and general  EBL:  450 mL   BLOOD ADMINISTERED:none  DRAINS: 1 - JP to bulb   LOCAL MEDICATIONS USED:  MARCAINE     SPECIMEN:  Source of Specimen:  1 - Rt Kidney, 2 - Lt Kidney, 3- Bladder, 4 - Pelvic Lymph Nodes  DISPOSITION OF SPECIMEN:  PATHOLOGY  COUNTS:  YES  TOURNIQUET:  * No tourniquets in log *  DICTATION: .Other Dictation: Dictation Number [id number]   PLAN OF CARE: Admit to inpatient   PATIENT DISPOSITION:  PACU - hemodynamically stable.   Delay start of Pharmacological VTE agent (>24hrs) due to surgical blood loss or risk of bleeding: yes

## 2018-02-17 NOTE — Transfer of Care (Signed)
Immediate Anesthesia Transfer of Care Note  Patient: Alexander [name]  Procedure(s) Performed: ROBOT ASSISTED LAPAROSCOPIC RADICAL CYSTOPROSTATECTOMY WITH BILATERAL PELVIC LYMPHADECTOMY (Bilateral ) XI ROBOT ASSITED LAPAROSCOPIC NEPHROURETERECTOMY (Bilateral )  Patient Location: PACU  Anesthesia Type:General  Level of Consciousness: awake, alert , oriented and patient cooperative  Airway & Oxygen Therapy: Patient Spontanous Breathing and Patient connected to face mask oxygen  Post-op Assessment: Report given to RN, Post -op Vital signs reviewed and stable and Patient moving all extremities  Post vital signs: Reviewed and stable  Last Vitals:  Vitals Value Taken Time  BP    Temp    Pulse 60 02/17/2018  3:58 PM  Resp 25 02/17/2018  3:58 PM  SpO2 100 % 02/17/2018  3:58 PM  Vitals shown include unvalidated device data.  Last Pain:  Vitals:   02/17/18 0512  TempSrc: Oral  PainSc:          Complications: No apparent anesthesia complications

## 2018-02-17 NOTE — Progress Notes (Signed)
Day of Surgery   Subjective/Chief Complaint:  1 - Aggressive Variant Bladder Cancer - Plasmacytoid - Micropapillary varient urothelial bladder cancer, large volume / broad based by TURBT 11/2017 in eval gross hematuria. Staging CT clinically localized. T1G3 clinically. No gross prostatic involvement.   Left Renovascular Anatomy: 1 artery, 1 retroaortic vein (inferior to artery), accessory splenic vein just above renal artery / adrenal area   Right Renovascular Anatomy: 1 artery, 2 vein (upper dominant above artery, lower accessory just anterior to artery)    2 - End Stage Renal Disease - on hemodialysis MWF viea left arm AVF x years. Follows Jamal Maes at [facility]. Voids few ounces per day.   Today "Alexander [name]" is ready for major surgery. Complete bowel prep to clear. Pre-op labs acceptable.    Objective: Vital signs in last 24 hours: Temp:  [97.3 F (36.3 C)-98.8 F (37.1 C)] 97.8 F (36.6 C) (11/22 0512) Pulse Rate:  [59-65] 64 (11/22 0512) Resp:  [18-20] 18 (11/22 0512) BP: (143-171)/(64-74) 143/74 (11/22 0512) SpO2:  [94 %-99 %] 98 % (11/22 0512) Weight:  [85.1 kg] 85.1 kg (11/21 1646)    Intake/Output from previous day: No intake/output data recorded. Intake/Output this shift: No intake/output data recorded.   Physical Exam  Constitutional: He appears well-developed. Numerous family members at bedside.  HENT:  Head: Normocephalic.  Stigmata of near blindness  Neck: Normal range of motion.  Cardiovascular: Normal rate.  Respiratory: Effort normal.  GI: Soft.  No scars.   Genitourinary:  Genitourinary Comments: No CVAT at present.   Musculoskeletal: Normal range of motion. Left arm AVF with thriss.  Neurological: He is alert.  Skin: Skin is warm.  Psychiatric: He has a normal mood and affect.   Lab Results:  Recent Labs    02/16/18 1812  WBC 7.2  HGB 10.7*  HCT 35.2*  PLT 157   BMET Recent Labs    02/16/18 1812  NA 140  K 3.2*  CL 95*  CO2  33*  GLUCOSE 94  BUN 12  CREATININE 3.08*  CALCIUM 8.6*   PT/INR No results for input(s): LABPROT, INR in the last 72 hours. ABG No results for input(s): PHART, HCO3 in the last 72 hours.  Invalid input(s): PCO2, PO2  Studies/Results: No results found.  Anti-infectives: Anti-infectives (From admission, onward)   Start     Dose/Rate Route Frequency Ordered Stop   02/17/18 0615  clindamycin (CLEOCIN) IVPB 900 mg    Note to Pharmacy:  Dr. Tresa Moore states " one time" dosage per pharmacy.   900 mg 100 mL/hr over 30 Minutes Intravenous  Once 02/16/18 1802     02/17/18 0545  ciprofloxacin (CIPRO) IVPB 400 mg    Note to Pharmacy:  Dr. Tresa Moore states "one time"dosage per pharmacy.   400 mg 200 mL/hr over 60 Minutes Intravenous 60 min pre-op 02/16/18 1802        Assessment/Plan:  Proceed as planned with major, curative-intent extirpative surgery for aggressive bladder cancer in setting of ESRD. Risks, benefits, expected peri-op course again dicussed this AM with pt and all present family members.   [facility], Tashiba Timoney 02/17/2018

## 2018-02-18 LAB — HEMOGLOBIN AND HEMATOCRIT, BLOOD
HCT: 23 % — ABNORMAL LOW (ref 39.0–52.0)
HEMOGLOBIN: 7.2 g/dL — AB (ref 13.0–17.0)

## 2018-02-18 LAB — GLUCOSE, CAPILLARY
GLUCOSE-CAPILLARY: 117 mg/dL — AB (ref 70–99)
GLUCOSE-CAPILLARY: 113 mg/dL — AB (ref 70–99)
GLUCOSE-CAPILLARY: 116 mg/dL — AB (ref 70–99)
GLUCOSE-CAPILLARY: 101 mg/dL — AB (ref 70–99)
Glucose-Capillary: 186 mg/dL — ABNORMAL HIGH (ref 70–99)
Glucose-Capillary: 117 mg/dL — ABNORMAL HIGH (ref 70–99)

## 2018-02-18 LAB — BASIC METABOLIC PANEL
ANION GAP: 14 (ref 5–15)
BUN: 29 mg/dL — ABNORMAL HIGH (ref 8–23)
CHLORIDE: 102 mmol/L (ref 98–111)
CO2: 22 mmol/L (ref 22–32)
Calcium: 8 mg/dL — ABNORMAL LOW (ref 8.9–10.3)
Creatinine, Ser: 5.5 mg/dL — ABNORMAL HIGH (ref 0.61–1.24)
GFR calc Af Amer: 10 mL/min — ABNORMAL LOW (ref >60)
GFR, EST NON AFRICAN AMERICAN: 9 mL/min — AB (ref >60)
GLUCOSE: 136 mg/dL — AB (ref 70–99)
POTASSIUM: 5.6 mmol/L — AB (ref 3.5–5.1)
SODIUM: 138 mmol/L (ref 135–145)

## 2018-02-18 LAB — ALBUMIN: ALBUMIN: 2.6 g/dL — AB (ref 3.5–5.0)

## 2018-02-18 LAB — PREPARE RBC (CROSSMATCH)

## 2018-02-18 MED ORDER — SODIUM CHLORIDE 0.9% IV SOLUTION: Freq: Once | INTRAVENOUS | Status: DC

## 2018-02-18 MED ORDER — SODIUM CHLORIDE 0.9 % IV SOLN
INTRAVENOUS | Status: DC
  Administered 2018-02-18: 14:00:00 via INTRAVENOUS

## 2018-02-18 MED ORDER — SODIUM CHLORIDE 0.9 % IV BOLUS
250.0000 mL | Freq: Once | INTRAVENOUS | Status: AC
  Administered 2018-02-18: 250 mL via INTRAVENOUS

## 2018-02-18 MED ORDER — CHLORHEXIDINE GLUCONATE CLOTH 2 % EX PADS
6.0000 | MEDICATED_PAD | Freq: Every day | CUTANEOUS | Status: DC
  Administered 2018-02-19: 6 via TOPICAL

## 2018-02-18 MED ORDER — SODIUM ZIRCONIUM CYCLOSILICATE 10 G PO PACK
10.0000 g | PACK | Freq: Once | ORAL | Status: AC
  Administered 2018-02-18: 10 g via ORAL
  Filled 2018-02-18 (×2): qty 1

## 2018-02-18 NOTE — Consult Note (Signed)
Reason for Consult: Continuity of ESRD care Referring Physician: Alexis Frock, MD (Urology)  HPI:  77 year old Caucasian man with past medical history significant for diabetes mellitus, congestive heart failure, coronary artery disease, history of aortic stenosis and end-stage renal disease on hemodialysis on a Monday/Wednesday/Friday schedule at [facility].  He yesterday underwent robot-assisted laparoscopic radical cystoprostatectomy, bilateral pelvic lymphadectomy and bilateral nephroureterectomy for aggressive variant bladder cancer.  Overnight, he was noted to have some hypotension and is on a gentle rate of maintenance fluids.  He reports some breakthrough pain in his abdomen and denies any chest pain or shortness of breath.  He is awaiting transfer to [facility].  Hemodialysis prescription: [facility] Center, 160 dialyzer, 4 hours, blood flow rate 450/dialysate flow 600, EDW 86.5 kg, 2K/2.0 calcium, heparin 2800 units bolus, no UF profiling/sodium modeling.  Hectorol 3 mcg IV 3 times weekly, Mircera 150 mcg every 2 weeks  Past Medical History:  Diagnosis Date  . Anemia   . Aortic stenosis 06/15/12   TEE - EF 14-48%; grade 1 diastolic dysfunction; mild/mod aortic valve stenosis; Mitral valve had calcified annulus, mild pulm htn PA peak pressure 48mmHg  . Barrett's esophagus 05/2003  . Bradycardia 2017   [employer] 2240 Assurity dual-lead pacemaker  . Cancer ([place])    bladder and kidney  . Carpal tunnel syndrome, bilateral 11/03/2015  . CHF (congestive heart failure) ([place])   . Colon polyps   . Coronary artery disease   . CVA (cerebral infarction)    2004/affected left side  . Depression   . Diabetes mellitus without complication ([place])   . Diabetic peripheral neuropathy ([place]) 10/02/2015  . Diverticulosis   . Dyspnea    with exertion  . ESRD (end stage renal disease) on dialysis ([place])    "[facility]; [place]; MWF" (05/12/2017)  . Failure  to thrive syndrome, adult 10/30/2017  . GERD (gastroesophageal reflux disease)   . Glaucoma   . History of kidney stones   . Hyperlipidemia   . Hypertension   . Legally blind   . Macular degeneration    both eyes  . Orthostatic hypotension 09/09/2015  . Paroxysmal atrial fibrillation ([facility])   . Peptic ulcer    bleeding, 1969  . PONV (postoperative nausea and vomiting)   . Presence of permanent cardiac pacemaker   . S/P epidural steroid injection    last  injection over 10 years ago  . Seasonal allergies   . Tubular adenoma of colon 07/2001    Past Surgical History:  Procedure Laterality Date  . AV FISTULA PLACEMENT  2009  . BACK SURGERY    . CARDIOVERSION N/A 11/13/2015   Procedure: CARDIOVERSION;  Surgeon: Troy Sine, MD;  Location: [facility] ENDOSCOPY;  Service: Cardiovascular;  Laterality: N/A;  . CARDIOVERSION N/A 01/13/2016   Procedure: CARDIOVERSION;  Surgeon: Will Meredith [name], MD;  Location: [place];  Service: Cardiovascular;  Laterality: N/A;  . COLONOSCOPY WITH PROPOFOL N/A 01/05/2018   Procedure: COLONOSCOPY WITH PROPOFOL;  Surgeon: Ladene Artist, MD;  Location: [facility] ENDOSCOPY;  Service: Endoscopy;  Laterality: N/A;  . CORNEAL TRANSPLANT  1999   right eye  . CYSTOSCOPY  several times   kidney stones  . CYSTOSCOPY W/ RETROGRADES Bilateral 12/13/2017   Procedure: CYSTOSCOPY WITH RETROGRADE PYELOGRAM;  Surgeon: Irine Seal, MD;  Location: [facility] ORS;  Service: Urology;  Laterality: Bilateral;  . EP IMPLANTABLE DEVICE N/A 03/11/2015   Procedure: Pacemaker Implant;  Surgeon: Will Meredith [name], MD;  Francella Solian.  [employer] 2240 Assurity;  Laterality: Left  . LAMINECTOMY  1969  . POLYPECTOMY  01/05/2018   Procedure: POLYPECTOMY;  Surgeon: Ladene Artist, MD;  Location: [facility] END[facility]COPY;  Service: Endoscopy;;  . RIGHT/LEFT HEART CATH AND CORONARY ANGIOGRAPHY N/A 08/20/2016   Procedure: Right/Left Heart Cath and Coronary Angiography;  Surgeon: Burnell Blanks, MD;  Location: [place] CV LAB;  Service: Cardiovascular;  Laterality: N/A;  . TEE WITHOUT CARDIOVERSION N/A 07/22/2016   Procedure: TRANSESOPHAGEAL ECHOCARDIOGRAM (TEE);  Surgeon: Skeet Latch, MD;  Location: [place];  Service: Cardiovascular;  Laterality: N/A;  . TEE WITHOUT CARDIOVERSION N/A 08/31/2016   Procedure: TRANSESOPHAGEAL ECHOCARDIOGRAM (TEE);  Surgeon: Burnell Blanks, MD;  Location: [place];  Service: Open Heart Surgery;  Laterality: N/A;  . TEE WITHOUT CARDIOVERSION N/A 12/01/2016   Procedure: TRANSESOPHAGEAL ECHOCARDIOGRAM (TEE);  Surgeon: Larey Dresser, MD;  Location: [facility] ENDOSCOPY;  Service: Cardiovascular;  Laterality: N/A;  . TONSILLECTOMY  1964  . TRANSCATHETER AORTIC VALVE REPLACEMENT, TRANSFEMORAL N/A 08/31/2016   Procedure: TRANSCATHETER AORTIC VALVE REPLACEMENT, TRANSFEMORAL;  Surgeon: Burnell Blanks, MD;  Location: [place];  Service: Open Heart Surgery;  Laterality: N/A;  . TRANSURETHRAL RESECTION OF BLADDER TUMOR N/A 12/13/2017   Procedure: TRANSURETHRAL RESECTION OF BLADDER TUMOR (TURBT);  Surgeon: Irine Seal, MD;  Location: [facility] OR[facility];  Service: Urology;  Laterality: N/A;    Family History  Problem Relation Age of Onset  . Stomach cancer Mother   . Hypertension Father        Died of heart attack  . Heart attack Father   . Stroke Sister   . Heart disease Sister   . Cancer Brother   . Colon cancer Neg Hx     Social History:  reports that he quit smoking about 20 years ago. His smoking use included cigarettes. He has a 90.00 pack-year smoking history. He has never used smokeless tobacco. He reports that he does not drink alcohol or use drugs.  Allergies:  Allergies  Allergen Reactions  . Penicillins Swelling and Rash    Has patient had a PCN reaction causing immediate rash, facial/tongue/throat swelling, SOB or lightheadedness with hypotension: Yes Has patient had a PCN reaction causing severe rash involving mucus membranes or skin necrosis: No Has patient  had a PCN reaction that required hospitalization: No Has patient had a PCN reaction occurring within the last 10 years: No If all of the above answers are "NO", then may proceed with Cephalosporin use.   . Atorvastatin     MYALGIA   . Codeine Nausea Only  . Tramadol Nausea Only    Medications:  Scheduled: . sodium chloride   Intravenous Once  . bromocriptine  5 mg Oral QODAY  . cinacalcet  60 mg Oral Q supper  . digoxin  62.5 mcg Oral Daily  . dorzolamide-timolol  1 drop Both Eyes BID  . gabapentin  200 mg Oral QHS  . insulin aspart  0-15 Units Subcutaneous TID WC  . latanoprost  1 drop Both Eyes QHS  . pantoprazole  40 mg Oral Daily  . pravastatin  10 mg Oral Daily  . predniSONE  5 mg Oral QHS  . sertraline  50 mg Oral Daily  . sevelamer carbonate  1,600 mg Oral TID WC    BMP Latest Ref Rng & Units 02/18/2018 02/17/2018 02/17/2018  Glucose 70 - 99 mg/dL 136(H) 211(H) 181(H)  BUN 8 - 23 mg/dL 29(H) 25(H) 20  Creatinine 0.61 - 1.24 mg/dL 5.50(H) 5.18(H) 3.81(H)  BUN/Creat Ratio 10 - 24 - - -  Sodium 135 - 145 mmol/L 138 138 143  Potassium 3.5 - 5.1 mmol/L 5.6(H) 4.4 3.2(L)  Chloride 98 - 111 mmol/L 102 98 108  CO2 22 - 32 mmol/L 22 27 24   Calcium 8.9 - 10.3 mg/dL 8.0(L) 8.5(L) 7.1(L)   CBC Latest Ref Rng & Units 02/18/2018 02/17/2018 02/17/2018  WBC 4.0 - 10.5 K/uL - - -  Hemoglobin 13.0 - 17.0 g/dL 7.2(L) 8.4(L) 8.5(L)  Hematocrit 39.0 - 52.0 % 23.0(L) 27.6(L) 28.7(L)  Platelets 150 - 400 K/uL - - -     No results found.  Review of Systems  Constitutional: Positive for malaise/fatigue. Negative for chills and fever.  HENT: Negative.   Eyes: Negative.   Respiratory: Negative.   Cardiovascular: Negative.   Gastrointestinal: Positive for abdominal pain and nausea.       Pain over surgical site  Genitourinary: Negative.   Musculoskeletal: Negative.   Skin: Negative.   Neurological: Negative.    Blood pressure (!) 94/42, pulse 93, temperature 97.6 F (36.4  C), temperature source Oral, resp. rate (!) 26, height 5\' 10"  (1.778 m), weight 87.5 kg, SpO2 98 %. Physical Exam  Nursing note and vitals reviewed. Constitutional: He is oriented to person, place, and time. He appears well-developed and well-nourished. No distress.  HENT:  Head: Normocephalic and atraumatic.  Mouth/Throat: Oropharynx is clear and moist. No oropharyngeal exudate.  Eyes: Pupils are equal, round, and reactive to light. Conjunctivae are normal. No scleral icterus.  Neck: Normal range of motion. Neck supple. No JVD present.  Cardiovascular: Normal rate, regular rhythm and normal heart sounds.  Respiratory: Effort normal and breath sounds normal. He has no wheezes. He has no rales.  GI: Soft. Bowel sounds are normal. There is tenderness.  Musculoskeletal: He exhibits no edema.  Left radiocephalic fistula with poor quality thrill and low pitched bruit  Neurological: He is alert and oriented to person, place, and time.  Skin: Skin is warm and dry.    Assessment/Plan: 1.  Status post cystoprostatectomy with pelvic lymphadenectomy and bilateral nephroureterectomy for aggressive variant bladder cancer.  Doing well from a postoperative standpoint with some hypotension. 2.  ESRD: Plan for hemodialysis today after altered schedule last week to accommodate for surgery yesterday.  I am hopeful that he will be able to tolerate hemodialysis otherwise will need placement of dialysis catheter for CRRT with his hypotension.  I am not very impressed on the physical exam of his left radiocephalic fistula which has a high risk of thrombosis with hypotension. 3.  Acute blood loss anemia in a patient with anemia of chronic kidney disease: Agree with PRBC transfusion for correction of anemia along with volume/blood pressure improvement. 4.  Hypotension: Suspect that this is probably due to the residual effects of anesthesia, monitor with fluids/PRBC transfusion. 5.  Secondary hyperparathyroidism:  Resume phosphorus binders and Hectorol with dialysis.    Raeven Pint K. 02/18/2018, 11:34 AM

## 2018-02-18 NOTE — Op Note (Signed)
NAME: Alexander [name], Alexander [name] MEDICAL RECORD [id number] ACCOUNT [id number] DATE OF BIRTH:[birth year] FACILITY: [facility] LOCATION: [id number] PHYSICIAN:Adron Geisel, MD  OPERATIVE REPORT  DATE OF PROCEDURE:  02/17/2018  PREOPERATIVE DIAGNOSIS:  Aggressive variant bladder cancer.  ASSISTANT: Debbrah Alar, PA  PROCEDURE: 1.  Bilateral nephroureterectomy robotic. 2.  Radical cystoprostatectomy with pelvic lymph node dissection. 3.  Right laparoscopic inguinal hernia repair.  ESTIMATED BLOOD LOSS:  400 mL.  COMPLICATIONS:  None.  SPECIMEN: 1.  Right kidney and ureter. 2.  Left kidney and ureter. 3.  Cystoprostatectomy. 4.  Right external iliac lymph nodes. 5.  Right obturator lymph nodes. 6.  Right common iliac lymph nodes. 7.  Right internal iliac lymph nodes. 8.  Left common iliac lymph nodes. 9.  Left internal iliac lymph nodes. 10.  Left external iliac lymph nodes. 11.  Left obturator lymph nodes.  FINDINGS: 1.  One artery 2 vein, right renovascular anatomy was dissipated. 2.  One artery, one-vein, left renal vascular anatomy with aberrant splenic vessel has dissipated. 3.  Evidence of diverticulosis without evidence of recurrent diverticulitis. 4.  Right indirect inguinal hernia.  INDICATIONS:  The patient is a very pleasant, but quite comorbid 77 year old gentleman with history of end-stage renal disease on dialysis, dysrhythmia with pacemaker defibrillator.  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  This is known to be incredibly aggressive and chemo nonresponsive.  Options were discussed for management including curative intent surgical extirpation versus palliative only therapy versus treating as  per a less aggressive variety.  The patient and his family was adamant they wished to proceed with a curative intent path despite significant comorbidity.  Given his end-stage renal status already dialysis  dependent most definitive curative therapy was  felt to represent a radical cystectomy again already end-stage renal and not felt to be a future transplant candidate so that avoiding urinary diversion would be most advantageous for bilateral nephrectomy being most reasonable.  He wished to proceed.    He underwent dialysis yesterday morning.  He was admitted yesterday for bowel prep.  He has been extensively counseled by nephrology and cardiac teams, medical teams.  He also had preoperative dental work to remove loose teeth.  He presents today for  surgery.  Informed consent was obtained and placed in the medical record.  DESCRIPTION OF PROCEDURE:  The patient was identified, the procedure being bilateral nephroureterectomy, cystectomy was confirmed.  Procedure timeout was performed.  Antibiotics administered.  General endotracheal anesthesia induced.  The patient was  initially placed into a right side up full flank position, pulling 15 degrees of table flexion, superior arm elevator with axillary roll, sequential devices, bottom leg bent, top leg straight.  He was further fastened to operative table using 3-inch tape  with foam padding across the supraxiphoid chest and his pelvis using tape over foam padding.  Notably, his left arm with the fistula was very carefully padded circumferentially with foam padding, a palpable thrill noted.  A superior arm elevator used.    A sterile field was created by prepping and draping the patient's right flank and abdomen using chlorhexidine gluconate, after that a Foley catheter was placed per urethra to straight drain, and a high-flow, low-pressure pneumoperitoneum was obtained  with Veress technique, right lower quadrant, having passed the aspiration and drop test.  Next, an 8 mm robotic camera port was placed in position approximately 1 handbreadth superior lateral to the umbilicus.  Laparoscopic incisions into the  peritoneal  cavity and no significant adhesions,  no visceral injury.    Additional ports were placed as follows:  Right subcostal 8 mm robotic port, right far lateral 8 mm robotic port 4 fingerbreadths superior and medial to the anterior iliac spine, right paramedian inferior robotic port 1 handbreadth right superior pubic  ramus, 2 assistant port sites to midline, one in the supraumbilical crease and another 2 fingerbreadths above the camera port and finally a 5 mm port in the midline subxiphoid location through which a self-locking laparoscopic grasper was used to place  the liver on gentle superior traction elevating the surface Gerota's fascia.  Robot was then undocked.    Primary attention was directed to development of the right retroperitoneum.  Incision was made lateral to the ascending colon near the cecum towards the area of the hepatic flexure.  The colonoscope was then medially.  The lower pole of the kidney area  was identified, placed on gentle lateral traction.  Dissection proceeded medial to this, the ureter was encountered and placed on gentle lateral traction.  Dissection proceeded in a superior fashion with this triangle towards the renal hilum.  Renal  hilum consisted of a single artery, a single vein inferiorly plus a more dominant superior vein renovascular anatomy.  The artery was controlled using an extra-large clip proximal vascular stapler distally.  The small accessory lower vein controlled  using vascular stapler and the larger dominant superior vein controlled using vascular stapler.  A plane was chosen to preserve his adrenal gland and this was separated away from the superolateral aspect of the kidney and a combination of bipolar  dissection and vascular stapler.  Superior and lateral attachments were then carefully released.  The ureter was then sequentially mobilized distally to the area of the deep pelvis towards the area of the iliac crossing.  It was doubly clipped and  ligated.  This completely freed up the right  nephroureterectomy specimen and was placed in an EndoCatch bag for later retrieval.  Port sites were then closed using a figure-of-eight nylon at port sites to be reused.  Port sites that were only used for  this half of the procedure were closed using subcuticular Monodryl and Dermabond.    The patient was then repositioned into a left side up mirror image position completely reprepping and draping and his fistula was palpably patent and padded once again.  The 2 previous assistant port sites were purposely left open.  They were then  covered with Ioban.  They were reprepped into the field and a 12 mm port was placed through the supraumbilical port.  Using blunt dissection a pneumoperitoneum was once again reestablished.  Additional ports were placed as follows:  Left subcostal 8 mm  robotic port, left far lateral 8 mm robotic port 4 fingerbreadths superior and medial to the iliac spine, left paramedian inferior robotic port 1 handbreadth superior to pubic ramus and a new camera port.  A mirror image dissection, mirror image location  to the right.  Robot was docked and passed electronic checks.    Attention was directed at development of the retroperitoneum on the left side, it was made lateral to the descending colon from the area of the splenic flexure towards the area of the internal ring.  The colon was carefully swept medially.  There was  some redundancy of the area of the sigmoid with some diverticulosis without evidence of active diverticulitis, lower pole of the kidney was identified, placed on gentle lateral traction.  Dissection proceeded medial to this, the ureter was encountered as  were the gonadal vessels.  The gonadal vessels were allowed to lie medially.  Dissection proceeded within this triangle towards the renal hilum.  Renal hilum was somewhat unusual with a dominant artery and vein inferiorly and a usual accessory splenic  vein coming very close to the superior pole of the kidney  with a vessel but I connected this with the renal vein.  The artery was controlled using an extra-large clip proximally, vascular stapler distally and the vein controlled using vascular stapler.   A purposeful plane of vascular control was chosen to avoid manipulation of the accessory splenic vein or its connection to the renal vein.  Superior medial attachments were taken down using vascular stapler performing complete adrenal sparing on the right  side as well.  Superior and lateral attachments were taken down using cautery scissors.  The ureters were completely mobilized distally to the area of the iliac vessels were it was doubly clipped and ligated.  The specimen was placed in an extra-large  EndoCatch bag.  The specimen string being purposely pulled through the superior most distal port site as were the left sided one.  Again, all port sites were closed with interrupted U-stitch nylon or subcuticular Monocryl.    The patient was then finally repositioned into a lower lithotomy position, removing the beanbag underneath him and placing a nonskid pad and a sterile field was created prepped and draped.  The patient's infra-xiphoid abdomen using chlorhexidine  gluconate on his penis, perineum and proximal thighs using iodine and a new Foley catheter was placed to straight drain and a LAVH type drape was applied.  A test in steep Trendelenburg positioning was performed and found to be suitably positioned.   Pneumoperitoneum was reestablished by placing a 12 mm blunt trocar through the previous periumbilical site reestablished pneumoperitoneum.  An 8 mm robotic camera port was placed through this location in a piggyback fashion and the right paramedian 8 mm  robotic port was placed right far lateral 12 mm AirSeal assist port, left paramedian robotic port, left far lateral 8 mm robotic port through the previously used port sites for the kidney dissections.    Attention was then directed at the left side.   Incision was made lateral to the left medial umbilical ligament from the area of the internal ring coursing along the iliac vessels towards the area of the left ureter stump and this was used as a medial  bucket handle in the left lateral bladder wall and swept away from the pelvic sidewall towards the area of the endopelvic fascia, which was carefully swept away from the lateral aspect of the prostate towards the area of the apex.  The ureter was  mobilized circumferentially and distally to the ureterovesical junction as noted by the superior vesicle artery.  Left-sided pelvic lymphadenectomy was performed.  First, all fibrofatty tissue within the boundaries of the left external iliac vein, pelvic  sidewall, external iliac artery, common iliac bifurcation was dissected free.  Lymphostasis achieved with cold clips, set aside, labeled left external iliac lymph nodes.  Fatty tissue in the confines of the left external iliac vein, pelvic sidewall, obturator nerve were dissected free and labeled as left obturator lymph nodes.  Fibrofatty tissue overlying the left internal iliac vessels from the iliac bifurcation to the  area of the superior vesicle artery was mobilized and set aside labeled as left internal iliac lymph nodes and finally fibrotic tissue overlying the  area from the aortic bifurcation to the iliac bifurcation was dissected free.  There was left common  iliac lymph nodes noted.  There was a paucity of tissue in this location.    Similarly, a right-sided lateral bladder incision was made lateral to the right medial umbilical ligament from the anterior abdominal wall towards the area of the internal ring coursing along the iliac vessels towards the area of the right ureteral  stump.  The right ureter stump was circumferentially mobilized to the area of the ureterovesical junction.  Endopelvic fascia was swept away from the lateral aspect of the prostate and base to apex orientation.  Next, a  right-sided pelvic lymph node dissection was performed using the exact same limits as per the left with the external iliac, internal iliac, obturator, common iliac lymph nodes respectively.  It was noted the patient had a quite large  indirect inguinal hernia with approximately 2.5 cm fascial defect.  Given the fact that his bladder was now out of the way, it was felt this to be exceedingly high risk for bowel strangulation and the decision was made to perform a laparoscopic inguinal  hernia repair using 0 Ethibond suture in a figure-of-eight fashion.  The internal ring was carefully reapproximated closing the hernia defect times 2 which resulted in excellent reapposition of the fascia and the internal ring and complete resolution of  the hernia without excessive tension.    Next, posterior dissection was performed by connecting the previous lateral most posterior peritoneal incisions of the bladder and posterior peritoneal flap was developed towards the apex of the prostate.  This exposed the vascular pedicles of the  bladder and prostate, which was carefully controlled using vascular load stapler x2 each side, taking exquisite care to avoid rectal injury which did not occur grossly.  The bladder was then dropped away from the anterior abdominal wall, developing the  space of Retzius.  This exposed the dorsal venous complex, it was controlled using a green load stapler.  Final apical dissection was performed in the anterior plane placing the prostate and bladder on superior traction and the membranous urethra was  fully transected.  The in situ Foley catheter was doubly clipped and used a bucket handle for the area the specimen was then placed into a third extra-large EndoCatch bag for later retrieval.  The area of the rectum was very carefully inspected.  Digital  rectal exam was performed using indicator glove and laparoscopic vision.  No evidence of rectal violation was noted.  The membranous urethral  stump was oversewn using 3-0 Vicryl to prevent inadvertent prolonged penile drainage.  Closed suction drain was  brought out the previous left lateral most robotic port site near the peritoneal cavity.  Robot was then undocked.  Specimens were retrieved by extending the previous supraumbilical port site and connecting it to the superior most midline incision site  and the right kidney and ureter, left kidney and ureter, cystoprostatectomy specimen were retreived separately and set aside from pathology.  We reaproximated fascia using figure-of-eight Novafil x8 followed by reapproximation of Scarpa's with a running Vicryl.  All  incision sites were infiltrated with dilute lipolyzed Marcaine and closed level of skin and subcuticular Monocryl and Dermabond.  Procedure terminated.    The patient tolerated the procedure exceptionally well considering his comorbidity.  He was taken to postanesthesia care in stable condition with plan for a step down admission, likely transfer to the hospital tomorrow for continuation of dialysis.  Please note, first assistant,  Debbrah Alar was crucial for all robotic portions of the procedure today.  She provided invaluable retraction, suctioning, vascular clipping, vascular stapling, assessment, manipulation and bedside assistance.  AN/NUANCE  D:02/17/2018 T:02/18/2018 JOB:[id number]

## 2018-02-18 NOTE — Progress Notes (Signed)
Urology Progress Note   1 Day Post-Op  Subjective: Hypotension overnight after receiving dilaudid. BP improved with a total of 750 cc NS bolus (250 then 500). Asymptomatic throughout without change in mentation. Patient denies nausea this AM. Tolerating small amount of water. Pain controlled. This AM, starting experiencing pain in bilateral hands with cramping.   Objective: Vital signs in last 24 hours: Temp:  [96.4 F (35.8 C)-97.9 F (36.6 C)] 97.6 F (36.4 C) (11/23 0800) Pulse Rate:  [60-145] 80 (11/23 0900) Resp:  [12-25] 24 (11/23 0900) BP: (81-181)/(34-70) 90/51 (11/23 0900) SpO2:  [5 %-100 %] 98 % (11/23 0900) Arterial Line BP: (154-175)/(44-62) 154/44 (11/22 1700) Weight:  [87.5 kg] 87.5 kg (11/22 1738)  Intake/Output from previous day: 11/22 0701 - 11/23 0700 In: 3552 [I.V.:1604.9; IV Piggyback:1947.1] Out: 550 [Drains:100; Blood:450] Intake/Output this shift: No intake/output data recorded.  Physical Exam:  General: Alert and oriented CV: RRR Lungs: NWOB Abdomen: Soft, appropriately tender. Moderately distended. Incisions c/d/i.  Ext: NT, No erythema, fistula with palpable thrill  Lab Results: Recent Labs    02/17/18 1616 02/17/18 2109 02/18/18 0326  HGB 8.5* 8.4* 7.2*  HCT 28.7* 27.6* 23.0*   BMET Recent Labs    02/17/18 2109 02/18/18 0326  NA 138 138  K 4.4 5.6*  CL 98 102  CO2 27 22  GLUCOSE 211* 136*  BUN 25* 29*  CREATININE 5.18* 5.50*  CALCIUM 8.5* 8.0*     Studies/Results: No results found.  Assessment/Plan:  77 y.o. male s/p adrenal-sparing bilateral nephrectomies, cystectomy. Overall doing well post-op, although having issues with asymptomatic hypotension, possibly 2/2 effects of anesthesia. Continues to require ongoing resuscitation.   - Additional 250 mL of NS bolus this AM. - Transfuse 1U pRBCs for hypotension in setting of acute blood loss anemia.  - Transfer to [facility] today for dialysis in setting of hyperkalemia which  may worsen with blood transfusion. - Continue a-line. - Will continue to closely monitor BP with target SBPs of high 90s, low 100s per nephrology. - Continue CLD, likely advance tomorrow. - SCDs for DVT ppx. If hemoglobin stable tomorrow, will consider starting [facility].     LOS: 1 day   Dorothey Baseman 02/18/2018, 10:18 AM

## 2018-02-19 ENCOUNTER — Encounter (HOSPITAL_COMMUNITY): Payer: Self-pay

## 2018-02-19 LAB — BASIC METABOLIC PANEL
ANION GAP: 14 (ref 5–15)
BUN: 42 mg/dL — AB (ref 8–23)
CALCIUM: 7.4 mg/dL — AB (ref 8.9–10.3)
CO2: 22 mmol/L (ref 22–32)
Chloride: 97 mmol/L — ABNORMAL LOW (ref 98–111)
Creatinine, Ser: 6.88 mg/dL — ABNORMAL HIGH (ref 0.61–1.24)
GFR calc Af Amer: 8 mL/min — ABNORMAL LOW (ref >60)
GFR calc non Af Amer: 7 mL/min — ABNORMAL LOW (ref >60)
GLUCOSE: 134 mg/dL — AB (ref 70–99)
Potassium: 4.2 mmol/L (ref 3.5–5.1)
Sodium: 133 mmol/L — ABNORMAL LOW (ref 135–145)

## 2018-02-19 LAB — GLUCOSE, CAPILLARY
GLUCOSE-CAPILLARY: 125 mg/dL — AB (ref 70–99)
Glucose-Capillary: 128 mg/dL — ABNORMAL HIGH (ref 70–99)
Glucose-Capillary: 86 mg/dL (ref 70–99)

## 2018-02-19 LAB — HEMOGLOBIN AND HEMATOCRIT, BLOOD
HCT: 24.2 % — ABNORMAL LOW (ref 39.0–52.0)
Hemoglobin: 7.4 g/dL — ABNORMAL LOW (ref 13.0–17.0)

## 2018-02-19 MED ORDER — LIDOCAINE-PRILOCAINE 2.5-2.5 % EX CREA
1.0000 "application " | TOPICAL_CREAM | CUTANEOUS | Status: DC | PRN
  Filled 2018-02-19: qty 5

## 2018-02-19 MED ORDER — DOCUSATE SODIUM 100 MG PO CAPS
100.0000 mg | ORAL_CAPSULE | Freq: Two times a day (BID) | ORAL | Status: DC
  Administered 2018-02-19 – 2018-02-24 (×11): 100 mg via ORAL
  Filled 2018-02-19 (×11): qty 1

## 2018-02-19 MED ORDER — SODIUM CHLORIDE 0.9 % IV SOLN: 100.0000 mL | INTRAVENOUS | Status: DC | PRN

## 2018-02-19 MED ORDER — PENTAFLUOROPROP-TETRAFLUOROETH EX AERO: 1.0000 "application " | INHALATION_SPRAY | CUTANEOUS | Status: DC | PRN

## 2018-02-19 MED ORDER — CHLORHEXIDINE GLUCONATE CLOTH 2 % EX PADS: 6.0000 | MEDICATED_PAD | Freq: Every day | CUTANEOUS | Status: DC

## 2018-02-19 MED ORDER — HEPARIN SODIUM (PORCINE) 1000 UNIT/ML DIALYSIS
1000.0000 [IU] | INTRAMUSCULAR | Status: DC | PRN
  Filled 2018-02-19: qty 1

## 2018-02-19 MED ORDER — ALTEPLASE 2 MG IJ SOLR: 2.0000 mg | Freq: Once | INTRAMUSCULAR | Status: DC | PRN

## 2018-02-19 MED ORDER — LIDOCAINE HCL (PF) 1 % IJ SOLN: 5.0000 mL | INTRAMUSCULAR | Status: DC | PRN

## 2018-02-19 NOTE — Plan of Care (Signed)
  Problem: Education: Goal: Knowledge of General Education information will improve Description Including pain rating scale, medication(s)/side effects and non-pharmacologic comfort measures Outcome: Progressing   Problem: Health Behavior/Discharge Planning: Goal: Ability to manage health-related needs will improve Outcome: Progressing   

## 2018-02-19 NOTE — Progress Notes (Signed)
[facility] Progress Note  Subjective: resting, no c/o. K down 4.2, BP's much better.  Got 1u prbc yest. Hb 7.4 this am.   Vitals:   02/18/18 1706 02/18/18 1959 02/18/18 2356 02/19/18 0339  BP: (!) 101/49 (!) 118/42 (!) 122/32 (!) 140/36  Pulse: 82 92 77 72  Resp: 18 18 (!) 24 (!) 23  Temp: 98 F (36.7 C) (!) 97.4 F (36.3 C) (!) 97.4 F (36.3 C) 98.4 F (36.9 C)  TempSrc: Oral Oral Oral Axillary  SpO2: 94% 95% 93% 92%  Weight:      Height:        Inpatient medications: . sodium chloride   Intravenous Once  . bromocriptine  5 mg Oral QODAY  . Chlorhexidine Gluconate Cloth  6 each Topical Q0600  . cinacalcet  60 mg Oral Q supper  . digoxin  62.5 mcg Oral Daily  . dorzolamide-timolol  1 drop Both Eyes BID  . gabapentin  200 mg Oral QHS  . insulin aspart  0-15 Units Subcutaneous TID WC  . latanoprost  1 drop Both Eyes QHS  . pantoprazole  40 mg Oral Daily  . pravastatin  10 mg Oral Daily  . predniSONE  5 mg Oral QHS  . sertraline  50 mg Oral Daily  . sevelamer carbonate  1,600 mg Oral TID WC   . sodium chloride 50 mL/hr at 02/18/18 1600   acetaminophen, diphenhydrAMINE **OR** diphenhydrAMINE, ondansetron, oxyCODONE  Iron/TIBC/Ferritin/ %Sat No results found for: IRON, TIBC, FERRITIN, IRONPCTSAT  Exam:  alert and lying flat, no distress  no jvd Chest cta bilat, no rales or wheezing Cor reg no mrg  Abd soft ntnd mild tenderness, dec'd BS  Ext no edema  NF, Ox3  L RC AVF+ bruit  Dialysis: MWF [facility]  4h  86.5kg  2/2 bath   Hep 2800   Qb 450  - hect 3ug  - mircera 150 q 2wks, last ?  Assessment/ Plan 1. Status post cystoprostatectomy/ bilateral nephrectomy 11/22 - for "aggressive variant bladder cancer". Postop hypotension/ Raliegh Ip much better today.  Hb stable mid 7's.  2. ESRD: usual HD MWF, did not get HD yest, is stable and will get HD this am.  Then Tuesday due to holiday schedule. 1kg up.  3. Anemia due to CKD + ABL: stable, follow Hb, transfuse if  Hb < 7  4. Hypotension: post op, resolved today, BP's good.  5. MBD ckd: Resume phosphorus binders and Hectorol with dialysis.    Kelly Splinter MD [employer] pager [phone]   02/19/2018, 7:08 AM   Recent Labs  Lab 02/16/18 1812  02/18/18 0326 02/18/18 1545 02/19/18 0436  NA 140   < > 138  --  133*  K 3.2*   < > 5.6*  --  4.2  CL 95*   < > 102  --  97*  CO2 33*   < > 22  --  22  GLUCOSE 94   < > 136*  --  134*  BUN 12   < > 29*  --  42*  CREATININE 3.08*   < > 5.50*  --  6.88*  CALCIUM 8.6*   < > 8.0*  --  7.4*  ALBUMIN 3.4*  --   --  2.6*  --    < > = values in this interval not displayed.   Recent Labs  Lab 02/16/18 1812  AST 20  ALT 23  ALKPHOS 55  BILITOT 1.1  PROT 6.5  Recent Labs  Lab 02/16/18 1812  02/18/18 0326 02/19/18 0436  WBC 7.2  --   --   --   HGB 10.7*   < > 7.2* 7.4*  HCT 35.2*   < > 23.0* 24.2*  MCV 104.5*  --   --   --   PLT 157  --   --   --    < > = values in this interval not displayed.

## 2018-02-19 NOTE — Anesthesia Postprocedure Evaluation (Signed)
Anesthesia Post Note  Patient: Alexander [name]  Procedure(s) Performed: ROBOT ASSISTED LAPAROSCOPIC RADICAL CYSTOPROSTATECTOMY WITH BILATERAL PELVIC LYMPHADECTOMY (Bilateral ) XI ROBOT ASSITED LAPAROSCOPIC NEPHROURETERECTOMY (Bilateral )     Patient location during evaluation: PACU Anesthesia Type: General Level of consciousness: awake Pain management: pain level controlled Vital Signs Assessment: post-procedure vital signs reviewed and stable Respiratory status: spontaneous breathing, nonlabored ventilation, respiratory function stable and patient connected to nasal cannula oxygen Cardiovascular status: blood pressure returned to baseline and stable Postop Assessment: no apparent nausea or vomiting Anesthetic complications: no    Last Vitals:  Vitals:   02/19/18 1200 02/19/18 1221  BP: (!) 82/69 (!) 132/54  Pulse: 73 87  Resp:  18  Temp:  (!) 36.4 C  SpO2:  100%    Last Pain:  Vitals:   02/19/18 1500  TempSrc:   PainSc: 8                  Ryan P Ellender

## 2018-02-19 NOTE — Progress Notes (Signed)
Pt sat on side of bed x 1 min, then walked with walker from bed to door with minimal assist.  Tolerated well.

## 2018-02-19 NOTE — Progress Notes (Signed)
Urology Progress Note   2 Days Post-Op  Subjective: Pain controlled, improved from yesterday. BPs much improved yesterday evening and this morning. Transferred to [facility] for dialysis today. Denies nausea. Very small amount of liquid intake yesterday, no flatus yet, no BM. Received 1U pRBCs and hemoglobin only increased from 7.2 to 7.4.   Objective: Vital signs in last 24 hours: Temp:  [97.4 F (36.3 C)-99.6 F (37.6 C)] 98.7 F (37.1 C) (11/24 0809) Pulse Rate:  [72-120] 76 (11/24 0809) Resp:  [18-26] 21 (11/24 0809) BP: (89-140)/(32-51) 125/49 (11/24 0809) SpO2:  [92 %-99 %] 92 % (11/24 0809) Arterial Line BP: (131)/(45) 131/45 (11/24 0748) Weight:  [88.8 kg] 88.8 kg (11/24 0809)  Intake/Output from previous day: 11/23 0701 - 11/24 0700 In: 443.2 [I.V.:81.2; Blood:362] Out: 220 [Drains:220] Intake/Output this shift: Total I/O In: -  Out: 18 [Drains:50]  Physical Exam:  General: Alert and oriented CV: RRR Lungs: NWOB Abdomen: Soft, appropriately tender. Moderately distended. Incisions c/d/i.  Ext: NT, No erythema  Lab Results: Recent Labs    02/17/18 2109 02/18/18 0326 02/19/18 0436  HGB 8.4* 7.2* 7.4*  HCT 27.6* 23.0* 24.2*   BMET Recent Labs    02/18/18 0326 02/19/18 0436  NA 138 133*  K 5.6* 4.2  CL 102 97*  CO2 22 22  GLUCOSE 136* 134*  BUN 29* 42*  CREATININE 5.50* 6.88*  CALCIUM 8.0* 7.4*     Studies/Results: No results found.  Assessment/Plan:  77 y.o. male s/p adrenal-sparing bilateral nephrectomies, cystectomy. Overall doing well post-op, although had issues with asymptomatic hypotension, possibly 2/2 effects of anesthesia, now resolved with stable BPs.   - Appreciate nephrology's assistance with this patient.  - Medlock IVF.  - Given significant improvement in blood pressures, defer ongoing need for arterial line to nephrology.  - Continue CLD, likely advance later today. - SCDs for DVT ppx. If hemoglobin stable tomorrow, will  consider starting [facility].  - Start colace for bowel regimen.     LOS: 2 days   Dorothey Baseman 02/19/2018, 8:51 AM

## 2018-02-20 ENCOUNTER — Encounter (HOSPITAL_COMMUNITY): Payer: Self-pay | Admitting: Urology

## 2018-02-20 LAB — HEMOGLOBIN AND HEMATOCRIT, BLOOD
HCT: 24.3 % — ABNORMAL LOW (ref 39.0–52.0)
Hemoglobin: 7.3 g/dL — ABNORMAL LOW (ref 13.0–17.0)

## 2018-02-20 LAB — BASIC METABOLIC PANEL
Anion gap: 9 (ref 5–15)
BUN: 24 mg/dL — AB (ref 8–23)
CHLORIDE: 98 mmol/L (ref 98–111)
CO2: 27 mmol/L (ref 22–32)
CREATININE: 4.95 mg/dL — AB (ref 0.61–1.24)
Calcium: 7.4 mg/dL — ABNORMAL LOW (ref 8.9–10.3)
GFR calc Af Amer: 12 mL/min — ABNORMAL LOW (ref >60)
GFR calc non Af Amer: 10 mL/min — ABNORMAL LOW (ref >60)
GLUCOSE: 168 mg/dL — AB (ref 70–99)
POTASSIUM: 4.2 mmol/L (ref 3.5–5.1)
SODIUM: 134 mmol/L — AB (ref 135–145)

## 2018-02-20 LAB — PHOSPHORUS: Phosphorus: 4.4 mg/dL (ref 2.5–4.6)

## 2018-02-20 LAB — BPAM RBC
BLOOD PRODUCT EXPIRATION DATE: 201912202359
BLOOD PRODUCT EXPIRATION DATE: 201912202359
ISSUE DATE / TIME: 201911231157
Unit Type and Rh: 5100
Unit Type and Rh: 5100

## 2018-02-20 LAB — TYPE AND SCREEN
ABO/RH(D): O POS
Antibody Screen: NEGATIVE
Unit division: 0
Unit division: 0

## 2018-02-20 LAB — GLUCOSE, CAPILLARY
GLUCOSE-CAPILLARY: 157 mg/dL — AB (ref 70–99)
GLUCOSE-CAPILLARY: 124 mg/dL — AB (ref 70–99)
GLUCOSE-CAPILLARY: 106 mg/dL — AB (ref 70–99)
Glucose-Capillary: 140 mg/dL — ABNORMAL HIGH (ref 70–99)

## 2018-02-20 LAB — PREPARE RBC (CROSSMATCH)

## 2018-02-20 MED ORDER — SODIUM CHLORIDE 0.9% IV SOLUTION: Freq: Once | INTRAVENOUS | Status: DC

## 2018-02-20 MED ORDER — CHLORHEXIDINE GLUCONATE CLOTH 2 % EX PADS: 6.0000 | MEDICATED_PAD | Freq: Every day | CUTANEOUS | Status: DC

## 2018-02-20 NOTE — Progress Notes (Signed)
Urology Progress Note   3 Days Post-Op  Subjective: Pain controlled, improved from yesterday. Started experiencing gas pains last night, no flatus or BM yet. OOB in room yesterday with much effort and support from staff. Denies nausea, tolerating CLD.    Objective: Vital signs in last 24 hours: Temp:  [97.5 F (36.4 C)-99.6 F (37.6 C)] 98.3 F (36.8 C) (11/25 0400) Pulse Rate:  [73-92] 92 (11/24 1630) Resp:  [18-24] 22 (11/25 0400) BP: (82-142)/(42-91) 135/50 (11/25 0400) SpO2:  [92 %-100 %] 98 % (11/25 0400) Arterial Line BP: (109-135)/(42-50) 135/50 (11/25 0400) Weight:  [87.8 kg-88.8 kg] 87.8 kg (11/24 1221)  Intake/Output from previous day: 11/24 0701 - 11/25 0700 In: 440 [P.O.:440] Out: 1185 [Drains:185] Intake/Output this shift: Total I/O In: -  Out: 100 [Drains:100]  Physical Exam:  General: Alert and oriented CV: RRR Lungs: NWOB Abdomen: Soft, appropriately tender. Moderately distended. Incisions c/d/i.  Ext: NT, No erythema  Lab Results: Recent Labs    02/17/18 2109 02/18/18 0326 02/19/18 0436  HGB 8.4* 7.2* 7.4*  HCT 27.6* 23.0* 24.2*   BMET Recent Labs    02/18/18 0326 02/19/18 0436  NA 138 133*  K 5.6* 4.2  CL 102 97*  CO2 22 22  GLUCOSE 136* 134*  BUN 29* 42*  CREATININE 5.50* 6.88*  CALCIUM 8.0* 7.4*     Studies/Results: No results found.  Assessment/Plan:  77 y.o. male s/p adrenal-sparing bilateral nephrectomies, cystectomy. Overall doing well post-op, although had issues with asymptomatic hypotension, possibly 2/2 effects of anesthesia, now resolved with stable BPs.   - Appreciate nephrology's assistance with this patient.  - Advance to renal diet today.  - Given significant improvement in blood pressures, defer ongoing need for arterial line to nephrology.  - SCDs for DVT ppx. consider starting [facility]. - Continue colace for bowel regimen.     LOS: 3 days   Dorothey Baseman 02/20/2018, 5:56 AM

## 2018-02-20 NOTE — Plan of Care (Signed)
  Problem: Education: Goal: Knowledge of General Education information will improve Description Including pain rating scale, medication(s)/side effects and non-pharmacologic comfort measures Outcome: Progressing   Problem: Health Behavior/Discharge Planning: Goal: Ability to manage health-related needs will improve Outcome: Progressing   

## 2018-02-20 NOTE — Progress Notes (Signed)
Urology paged regarding order to D/C JP drain.  Apparently last night's output of 200cc was recorded after the order was placed.  Holding on pulling drain at this time as per verbal orders.

## 2018-02-20 NOTE — Progress Notes (Signed)
Rehab Admissions Coordinator Note:  Patient was screened by Cleatrice Burke for appropriateness for an Inpatient Acute Rehab Consult per PT recommendation.   At this time, we are recommending Inpatient Rehab consult if patient would like to be considered for an inpt rehab admission. Please place order for rehab consult if he does.  Danne Baxter, RN, MSN Rehab Admissions Coordinator [phone] 02/20/2018 1:07 PM   Cleatrice Burke 02/20/2018, 1:06 PM  I can be reached at [phone].

## 2018-02-20 NOTE — Evaluation (Signed)
Physical Therapy Evaluation Patient Details Name: Alexander [name] MRN: [id number] DOB: 05-06-1940 Today's Date: 02/20/2018   History of Present Illness  77 year old Caucasian man with past medical history significant for diabetes mellitus, congestive heart failure, coronary artery disease, history of aortic stenosis and end-stage renal disease on hemodialysis on a Monday/Wednesday/Friday schedule at [facility].  On 11/22, pt underwent robot-assisted laparoscopic radical cystoprostatectomy, bilateral pelvic lymphadectomy and bilateral nephroureterectomy for aggressive variant bladder cancer.  That night, he was noted to have some hypotension and is on a gentle rate of maintenance fluids.  He reports some breakthrough pain in his abdomen and denies any chest pain or shortness of breath.  He was then transferred to [facility].  Clinical Impression  Pt admitted with above diagnosis. Pt currently with functional limitations due to the deficits listed below (see PT Problem List). Pt was able to pivot to chair with RW and +2 mod assist.  Pt weak and lethargic at times.  Needs directional cues due to blindness.  Recommend CIR consult to gain strength and endurance as he has support at home.  Pt will benefit from skilled PT to increase their independence and safety with mobility to allow discharge to the venue listed below.      Follow Up Recommendations CIR;Supervision/Assistance - 24 hour    Equipment Recommendations  Other (comment)(TBA)    Recommendations for Other Services Rehab consult     Precautions / Restrictions Precautions Precautions: Fall Precaution Comments: Pt is blind.  JP drain Restrictions Weight Bearing Restrictions: No      Mobility  Bed Mobility Overal bed mobility: Needs Assistance Bed Mobility: Supine to Sit     Supine to sit: Mod assist;+2 for physical assistance     General bed mobility comments: Needed assist to elevate trunk and  bring LEs off bed.  Transfers Overall transfer level: Needs assistance Equipment used: Rolling walker (2 wheeled) Transfers: Sit to/from [facility] Sit to Stand: Mod assist;+2 physical assistance;From elevated surface Stand pivot transfers: Mod assist;+2 physical assistance       General transfer comment: Pt needed power up to stand +2 assist.   Pt with flexed posture.  Was able to take pivotal steps around to chair.   Pt with posterior lean needing mod assist for stability throughout.    Ambulation/Gait                Stairs            Wheelchair Mobility    Modified Rankin (Stroke Patients Only)       Balance Overall balance assessment: Needs assistance Sitting-balance support: No upper extremity supported;Feet supported Sitting balance-Leahy Scale: Fair     Standing balance support: Bilateral upper extremity supported;During functional activity Standing balance-Leahy Scale: Poor Standing balance comment: relies heavily on UE support                             Pertinent Vitals/Pain Pain Assessment: Faces Faces Pain Scale: Hurts little more Pain Location: left side Pain Descriptors / Indicators: Aching;Grimacing;Guarding;Sore Pain Intervention(s): Limited activity within patient's tolerance;Monitored during session;Repositioned    Home Living Family/patient expects to be discharged to:: Private residence Living Arrangements: Spouse/significant other Available Help at Discharge: Family;Available 24 hours/day Type of Home: House Home Access: Stairs to enter   [employer] of Steps: 1 Home Layout: Multi-level Home Equipment: Walker - 4 wheels;Shower seat;Walker - 2 wheels;Hand held shower head;Wheelchair - manual  Additional Comments: 1 daughter is local    Prior Function Level of Independence: Needs assistance   Gait / Transfers Assistance Needed: Ambulates with rollator around home  ADL's / Homemaking  Assistance Needed: Independent with bathing and dressing. Wife may lay clothes out        Hand Dominance   Dominant Hand: Right    Extremity/Trunk Assessment   Upper Extremity Assessment Upper Extremity Assessment: Defer to OT evaluation    Lower Extremity Assessment Lower Extremity Assessment: Generalized weakness    Cervical / Trunk Assessment Cervical / Trunk Assessment: Normal  Communication   Communication: No difficulties  Cognition Arousal/Alertness: Awake/alert Behavior During Therapy: WFL for tasks assessed/performed Overall Cognitive Status: Within Functional Limits for tasks assessed                                        General Comments      Exercises General Exercises - Lower Extremity Ankle Circles/Pumps: AROM;Both;10 reps;Supine   Assessment/Plan    PT Assessment    PT Problem List         PT Treatment Interventions      PT Goals (Current goals can be found in the Care Plan section)  Acute Rehab PT Goals Patient Stated Goal: to go home PT Goal Formulation: With patient Time For Goal Achievement: 03/06/18 Potential to Achieve Goals: Good    Frequency Min 3X/week   Barriers to discharge        Co-evaluation               AM-PAC PT "6 Clicks" Mobility  Outcome Measure Help needed turning from your back to your side while in a flat bed without using bedrails?: A Lot Help needed moving from lying on your back to sitting on the side of a flat bed without using bedrails?: A Lot Help needed moving to and from a bed to a chair (including a wheelchair)?: A Lot Help needed standing up from a chair using your arms (e.g., wheelchair or bedside chair)?: A Lot Help needed to walk in hospital room?: Total Help needed climbing 3-5 steps with a railing? : Total 6 Click Score: 10    End of Session Equipment Utilized During Treatment: Gait belt;Oxygen Activity Tolerance: Patient limited by fatigue Patient left: in chair;with  call bell/phone within reach;with chair alarm set;with family/visitor present Nurse Communication: Mobility status;Other (comment)(nurse notified that drain leaking and came in to empty) PT Visit Diagnosis: Unsteadiness on feet (R26.81);Muscle weakness (generalized) (M62.81)    Time: 1000-1053 PT Time Calculation (min) (ACUTE ONLY): 53 min   Charges:   PT Evaluation $PT Eval Moderate Complexity: 1 Mod PT Treatments $Therapeutic Exercise: 8-22 mins $Therapeutic Activity: 23-37 mins        Idora Brosious,PT Acute Rehabilitation Services Pager:  [phone]  Office:  [phone]    Denice Paradise 02/20/2018, 12:55 PM

## 2018-02-20 NOTE — Progress Notes (Addendum)
Urology resident responded okay to d/c JP drain.  By the time I got in to remove physical therapy was beginning to get patient up, I emptied 50cc and after physical therapy finished there was another 75cc.  Family was concerned with how quickly drain refilled and asked that we not remove at this time. I told them that we will continue to monitor.  Dr. Tresa Moore updated.  He still felt the amount of drainage was acceptable to d/c drain, but was okay to leave in for now and he will evaluate upon his rounds this evening.

## 2018-02-20 NOTE — Progress Notes (Signed)
[facility] Progress Note  Subjective: no sig c/o's today, had 4h HD yest, 1 L off.  BP's ok today.   Vitals:   02/19/18 2000 02/20/18 0000 02/20/18 0400 02/20/18 0806  BP: (!) 142/44 (!) 109/42 (!) 135/50 127/63  Pulse:    80  Resp:  (!) 22 (!) 22 (!) 21  Temp: 97.9 F (36.6 C) 98.3 F (36.8 C) 98.3 F (36.8 C) (!) 97.4 F (36.3 C)  TempSrc: Oral Oral Oral Oral  SpO2: 99% 98% 98% 99%  Weight:      Height:        Inpatient medications: . sodium chloride   Intravenous Once  . bromocriptine  5 mg Oral QODAY  . Chlorhexidine Gluconate Cloth  6 each Topical Q0600  . Chlorhexidine Gluconate Cloth  6 each Topical Q0600  . cinacalcet  60 mg Oral Q supper  . digoxin  62.5 mcg Oral Daily  . docusate sodium  100 mg Oral BID  . dorzolamide-timolol  1 drop Both Eyes BID  . gabapentin  200 mg Oral QHS  . insulin aspart  0-15 Units Subcutaneous TID WC  . latanoprost  1 drop Both Eyes QHS  . pantoprazole  40 mg Oral Daily  . pravastatin  10 mg Oral Daily  . predniSONE  5 mg Oral QHS  . sertraline  50 mg Oral Daily  . sevelamer carbonate  1,600 mg Oral TID WC   . sodium chloride    . sodium chloride     sodium chloride, sodium chloride, acetaminophen, alteplase, diphenhydrAMINE **OR** diphenhydrAMINE, heparin, lidocaine (PF), lidocaine-prilocaine, ondansetron, oxyCODONE, pentafluoroprop-tetrafluoroeth  Iron/TIBC/Ferritin/ %Sat No results found for: IRON, TIBC, FERRITIN, IRONPCTSAT  Exam:  alert and lying flat, no distress  no jvd Chest cta bilat, no rales or wheezing Cor reg no mrg  Abd soft ntnd mild tenderness, dec'd BS  Ext no edema  NF, Ox3  L RC AVF+ bruit  Dialysis: MWF [facility]  4h  86.5kg  2/2 bath   Hep 2800   Qb 450  - hect 3ug  - mircera 150 q 2wks, last ?  Assessment/ Plan 1. Bladder cancer - SP cystoprostatectomy+ bilateral nephrectomy 11/22 for "aggressive variant" bladder Ca. Had post op hypotension which resolved < 24hrs.    2. ESRD: HD MWF. Up  1kg, had HD yest and will get HD tomorrow w/ prbc's most likely. 3. Anemia due to CKD + ABL:  Pt is very tired, Hb 7.3- 7.4 range. Will plan on transfusing w/ HD tomorrow due to symptomatic anemia.    4. Hypotension: resolved 5. MBD ckd: Resume phosphorus binders and Hectorol with dialysis.    Kelly Splinter MD [facility] pager [phone]   02/20/2018, 8:19 AM   Recent Labs  Lab 02/16/18 1812  02/18/18 1545 02/19/18 0436 02/20/18 0533  NA 140   < >  --  133* 134*  K 3.2*   < >  --  4.2 4.2  CL 95*   < >  --  97* 98  CO2 33*   < >  --  22 27  GLUCOSE 94   < >  --  134* 168*  BUN 12   < >  --  42* 24*  CREATININE 3.08*   < >  --  6.88* 4.95*  CALCIUM 8.6*   < >  --  7.4* 7.4*  ALBUMIN 3.4*  --  2.6*  --   --    < > = values in this  interval not displayed.   Recent Labs  Lab 02/16/18 1812  AST 20  ALT 23  ALKPHOS 55  BILITOT 1.1  PROT 6.5   Recent Labs  Lab 02/16/18 1812  02/19/18 0436 02/20/18 0533  WBC 7.2  --   --   --   HGB 10.7*   < > 7.4* 7.3*  HCT 35.2*   < > 24.2* 24.3*  MCV 104.5*  --   --   --   PLT 157  --   --   --    < > = values in this interval not displayed.

## 2018-02-21 DIAGNOSIS — I6930 Unspecified sequelae of cerebral infarction: Secondary | ICD-10-CM

## 2018-02-21 DIAGNOSIS — N186 End stage renal disease: Secondary | ICD-10-CM

## 2018-02-21 DIAGNOSIS — I4891 Unspecified atrial fibrillation: Secondary | ICD-10-CM

## 2018-02-21 DIAGNOSIS — Z950 Presence of cardiac pacemaker: Secondary | ICD-10-CM

## 2018-02-21 DIAGNOSIS — C679 Malignant neoplasm of bladder, unspecified: Principal | ICD-10-CM

## 2018-02-21 DIAGNOSIS — I2510 Atherosclerotic heart disease of native coronary artery without angina pectoris: Secondary | ICD-10-CM

## 2018-02-21 DIAGNOSIS — E119 Type 2 diabetes mellitus without complications: Secondary | ICD-10-CM

## 2018-02-21 DIAGNOSIS — R001 Bradycardia, unspecified: Secondary | ICD-10-CM

## 2018-02-21 DIAGNOSIS — G8918 Other acute postprocedural pain: Secondary | ICD-10-CM

## 2018-02-21 DIAGNOSIS — I5032 Chronic diastolic (congestive) heart failure: Secondary | ICD-10-CM

## 2018-02-21 LAB — BASIC METABOLIC PANEL
ANION GAP: 11 (ref 5–15)
BUN: 41 mg/dL — ABNORMAL HIGH (ref 8–23)
CHLORIDE: 98 mmol/L (ref 98–111)
CO2: 24 mmol/L (ref 22–32)
Calcium: 7.6 mg/dL — ABNORMAL LOW (ref 8.9–10.3)
Creatinine, Ser: 6.77 mg/dL — ABNORMAL HIGH (ref 0.61–1.24)
GFR calc Af Amer: 8 mL/min — ABNORMAL LOW (ref >60)
GFR, EST NON AFRICAN AMERICAN: 7 mL/min — AB (ref >60)
GLUCOSE: 128 mg/dL — AB (ref 70–99)
POTASSIUM: 4.1 mmol/L (ref 3.5–5.1)
Sodium: 133 mmol/L — ABNORMAL LOW (ref 135–145)

## 2018-02-21 LAB — GLUCOSE, CAPILLARY
Glucose-Capillary: 126 mg/dL — ABNORMAL HIGH (ref 70–99)
Glucose-Capillary: 92 mg/dL (ref 70–99)
Glucose-Capillary: 92 mg/dL (ref 70–99)
Glucose-Capillary: 87 mg/dL (ref 70–99)

## 2018-02-21 LAB — HEMOGLOBIN AND HEMATOCRIT, BLOOD
HCT: 23.5 % — ABNORMAL LOW (ref 39.0–52.0)
Hemoglobin: 7.3 g/dL — ABNORMAL LOW (ref 13.0–17.0)

## 2018-02-21 MED ORDER — DOXERCALCIFEROL 4 MCG/2ML IV SOLN
3.0000 ug | INTRAVENOUS | Status: DC
  Administered 2018-02-21 – 2018-02-24 (×2): 3 ug via INTRAVENOUS
  Filled 2018-02-21 (×2): qty 2

## 2018-02-21 MED ORDER — BISACODYL 10 MG RE SUPP: 10.0000 mg | Freq: Once | RECTAL | Status: DC

## 2018-02-21 MED ORDER — DOXERCALCIFEROL 4 MCG/2ML IV SOLN
INTRAVENOUS | Status: AC
  Filled 2018-02-21: qty 2

## 2018-02-21 NOTE — Care Management Note (Addendum)
Case Management Note Marvetta Gibbons RN, BSN Transitions of Care Unit 4E- RN Case Manager [phone]  Patient Details  Name: Alexander [name] MRN: [id number] Date of Birth: 01-Nov-1940  Subjective/Objective:    Pt admitted to [facility] s/p adrenal-sparing bilateral nephrectomies, cystectomy, tx to [facility] for HD needs- hx ESRD on HD-M/W/F               Action/Plan: PTA pt lived at home with spouse, hx of legal blindness, pt had pre-op referral for [facility] needs with [facility]-  per PT eval recommendation for CIR, consult for CIR placed- spoke with Claiborne Billings- admissions coordinator for IP rehab- they are following pt for possible admission on 11/27-   Expected Discharge Date:                  Expected Discharge Plan:  IP Rehab Facility  In-House Referral:  Clinical Social Work  Discharge planning Services  CM Consult  Post Acute Care Choice:    Choice offered to:     DME Arranged:    DME Agency:     HH Arranged:    [facility] Agency:     Status of Service:  In process, will continue to follow  If discussed at Long Length of Stay Meetings, dates discussed:    Discharge Disposition:   Additional Comments:  Dawayne Patricia, RN 02/21/2018, 2:18 PM

## 2018-02-21 NOTE — PMR Pre-admission (Addendum)
PMR Admission Coordinator Pre-Admission Assessment  Patient: Alexander [name] is an 77 y.o., male MRN: [id number] DOB: 12-08-1940 Height: '5\' 10"'  (177.8 cm) Weight: 86.7 kg              Insurance Information HMO:     PPO:      PCP:      IPA:      80/20: yes     OTHER:  PRIMARY: Medicare Part A and B      Policy#: [id number]      Subscriber: Patient CM Name:       Phone#:      Fax#:  Pre-Cert#:       Employer:  Benefits:  Phone #: NA     Name: verified via [employer] on  Eff. Date: Part A effective: 09/27/1999; Part B effective: 09/27/1999     Deduct: $1,364      Out of Pocket Max: NA      Life Max: NA CIR: Covered per Medicare guidelines once yearly deductible is met.       SNF: days 1-20, 100%; days 21-100, 80% Outpatient: 80%     Co-Pay: 20% Home Health: 100%      Co-Pay:  DME: 80%     Co-Pay: 20% Providers: Pt's choice SECONDARY: BCBS      Policy#: [id number]      Subscriber: Patient CM Name:       Phone#:      Fax#:  Pre-Cert#:       Employer:  Benefits:  Phone #: [phone]     Name:  Eff. Date:      Deduct:       Out of Pocket Max:       Life Max:  CIR:       SNF:  Outpatient:      Co-Pay:  Home Health:       Co-Pay:  DME:      Co-Pay:   Medicaid Application Date:       Case Manager:  Disability Application Date:       Case Worker:   Emergency Contact Information Contact Information    Name Relation Home Work Mobile   [place] Spouse [phone]  [phone]   Kylin, [place] Daughter [phone]  [phone]   Santez, [place] Daughter [phone]  [phone]   Nunn,Jennifer Daughter [phone]  [phone]     Current Medical History  Patient Admitting Diagnosis: Debility History of Present Illness: MERCY [name] is a 77 y.o.right handed male with history of diastolic congestive heart failure, 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  Per chart review and patient, patient lives with spouse. Used a Rollator prior to admission. One level home with ramped entry. Presented 02/17/2018 for robot assisted laparoscopic radical cystoprostatectomy, bilateral pelvic lymphadenectomy and bilateral nephroureterectomy per Dr. Holland Falling for bladder cancer. Hospital course pain management. Hemodialysis as per renal services. Therapy evaluations completed with recommendations of physical medicine rehabilitation consult. Pt has some serosanguineous drainage and new numbness and tingling ub left lower extremity on 02/22/18 with admission to CIR held on 11/27. Pt was reassessed and felt to be ready for CIR on 11/29. Pt is to be admitted to CIR on 02/22/2018.      Past Medical History  Past Medical History:  Diagnosis Date  . Anemia   . Aortic stenosis 06/15/12   TEE - EF 37-34%; grade 1 diastolic dysfunction; mild/mod aortic valve stenosis; Mitral valve had  calcified annulus, mild pulm htn PA peak pressure 26mHg  . Barrett's esophagus 05/2003  . Bradycardia 2017   St. [employer] 2240 Assurity dual-lead pacemaker  . Cancer ([facility]    bladder and kidney  . Carpal tunnel syndrome, bilateral 11/03/2015  . CHF (congestive heart failure) ([facility]   . Colon polyps   . Coronary artery disease   . CVA (cerebral infarction)    2004/affected left side  . Depression   . Diabetes mellitus without complication ([facility]   . Diabetic peripheral neuropathy ([facility] 10/02/2015  . Diverticulosis   . Dyspnea    with exertion  . ESRD (end stage renal disease) on dialysis ([facility]    "[facility]; [place]; MWF" (05/12/2017)  . Failure to thrive syndrome, adult 10/30/2017  . GERD (gastroesophageal reflux disease)   . Glaucoma   . History of kidney stones   . Hyperlipidemia   . Hypertension   . Legally blind   . Macular degeneration    both eyes  . Orthostatic hypotension 09/09/2015  . Paroxysmal atrial fibrillation (HCC)   . Peptic ulcer    bleeding, 1969  . PONV  (postoperative nausea and vomiting)   . Presence of permanent cardiac pacemaker   . S/P epidural steroid injection    last  injection over 10 years ago  . Seasonal allergies   . Tubular adenoma of colon 07/2001    Family History  family history includes Cancer in his brother; Heart attack in his father; Heart disease in his sister; Hypertension in his father; Stomach cancer in his mother; Stroke in his sister.  Prior Rehab/Hospitalizations:  Has the patient had major surgery during 100 days prior to admission? Yes  Current Medications   Current Facility-Administered Medications:  .  0.9 %  sodium chloride infusion (Manually program via Guardrails IV Fluids), , Intravenous, Once, GDorothey Baseman MD .  acetaminophen (TYLENOL) tablet 1,000 mg, 1,000 mg, Oral, Q6H PRN, MAlexis Frock MD, 1,000 mg at 02/22/18 0318 .  bisacodyl (DULCOLAX) suppository 10 mg, 10 mg, Rectal, Once, MAlexis Frock MD .  bromocriptine (PARLODEL) tablet 5 mg, 5 mg, Oral, QBerkley Harvey MD, 5 mg at 02/23/18 0806 .  Chlorhexidine Gluconate Cloth 2 % PADS 6 each, 6 each, Topical, Q0600, SRoney Jaffe MD, 6 each at 02/24/18 [phone].  cinacalcet (SENSIPAR) tablet 60 mg, 60 mg, Oral, Q supper, MAlexis Frock MD, 60 mg at 02/23/18 1726 .  digoxin (LANOXIN) tablet 62.5 mcg, 62.5 mcg, Oral, Daily, MAlexis Frock MD, 62.5 mcg at 02/23/18 0805 .  diphenhydrAMINE (BENADRYL) injection 12.5 mg, 12.5 mg, Intravenous, Q6H PRN **OR** diphenhydrAMINE (BENADRYL) 12.5 MG/5ML elixir 12.5 mg, 12.5 mg, Oral, Q6H PRN, Dancy, Amanda, PA-C .  docusate sodium (COLACE) capsule 100 mg, 100 mg, Oral, BID, GDorothey Baseman MD, 100 mg at 02/23/18 2158 .  dorzolamide-timolol (COSOPT) 22.3-6.8 MG/ML ophthalmic solution 1 drop, 1 drop, Both Eyes, BID, MAlexis Frock MD, 1 drop at 02/23/18 2200 .  doxercalciferol (HECTOROL) injection 3 mcg, 3 mcg, Intravenous, Q M,W,F-HD, SRoney Jaffe MD, 3 mcg at 02/21/18 1233 .   gabapentin (NEURONTIN) capsule 200 mg, 200 mg, Oral, QHS, MAlexis Frock MD, 200 mg at 02/23/18 2159 .  insulin aspart (novoLOG) injection 0-15 Units, 0-15 Units, Subcutaneous, TID WC, Dancy, Amanda, PA-C, 0 Units at 02/18/18 1821 .  latanoprost (XALATAN) 0.005 % ophthalmic solution 1 drop, 1 drop, Both Eyes, QHS, MAlexis Frock MD, 1 drop at 02/23/18 2200 .  ondansetron (ZOFRAN) injection 4 mg, 4 mg, Intravenous, Q4H  PRN, Debbrah Alar, PA-C .  oxyCODONE (Oxy IR/ROXICODONE) immediate release tablet 5 mg, 5 mg, Oral, Q4H PRN, Dancy, Amanda, PA-C, 5 mg at 02/20/18 1641 .  pantoprazole (PROTONIX) EC tablet 40 mg, 40 mg, Oral, Daily, Alexis Frock, MD, 40 mg at 02/23/18 0806 .  pravastatin (PRAVACHOL) tablet 10 mg, 10 mg, Oral, Daily, Alexis Frock, MD, 10 mg at 02/23/18 0805 .  predniSONE (DELTASONE) tablet 5 mg, 5 mg, Oral, QHS, Alexis Frock, MD, 5 mg at 02/23/18 2159 .  senna-docusate (Senokot-S) tablet 1 tablet, 1 tablet, Oral, BID, Alexis Frock, MD, 1 tablet at 02/23/18 2200 .  sertraline (ZOLOFT) tablet 50 mg, 50 mg, Oral, Daily, Alexis Frock, MD, 50 mg at 02/23/18 0805 .  sevelamer carbonate (RENVELA) tablet 1,600 mg, 1,600 mg, Oral, TID WC, Alexis Frock, MD, 1,600 mg at 02/23/18 1726  Patients Current Diet:  Diet Order            Diet renal with fluid restriction Fluid restriction: 1200 mL Fluid; Room service appropriate? Yes; Fluid consistency: Thin  Diet effective now              Precautions / Restrictions Precautions Precautions: Fall Precaution Comments: Pt is blind, can seen peripherally on the L slightly Restrictions Weight Bearing Restrictions: No   Has the patient had 2 or more falls or a fall with injury in the past year?No  Prior Activity Level Community (5-7x/wk): active PTA, retired Teacher, early years/pre, did not drive due to 20 year history of being blind  Development worker, international aid / [place] Devices/Equipment: Environmental consultant (specify  type), Other (Comment)(rollator) Home Equipment: Environmental consultant - 4 wheels, Shower seat, Environmental consultant - 2 wheels, Hand held shower head, Wheelchair - manual  Prior Device Use: Indicate devices/aids used by the patient prior to current illness, exacerbation or injury? Walker Agricultural consultant)  Prior Functional Level Prior Function Level of Independence: Needs assistance Gait / Transfers Assistance Needed: Ambulates with rollator around home ADL's / Homemaking Assistance Needed: Independent with bathing and dressing. Wife may lay clothes out Communication / Swallowing Assistance Needed: has some aphasia  Comments: reports no residual L weakness  Self Care: Did the patient need help bathing, dressing, using the toilet or eating?  Independent  Indoor Mobility: Did the patient need assistance with walking from room to room (with or without device)? Independent  Stairs: Did the patient need assistance with internal or external stairs (with or without device)? Independent  Functional Cognition: Did the patient need help planning regular tasks such as shopping or remembering to take medications? Not with remembering, but yes to assist with medications due to visual impairment  Current Functional Level Cognition  Overall Cognitive Status: Within Functional Limits for tasks assessed Orientation Level: Oriented X4    Extremity Assessment (includes Sensation/Coordination)  Upper Extremity Assessment: Generalized weakness(3+/5 BUE)  Lower Extremity Assessment: Generalized weakness    ADLs  Overall ADL's : Needs assistance/impaired Eating/Feeding: Minimal assistance, Bed level Upper Body Bathing: Minimal assistance, Bed level Lower Body Bathing: Maximal assistance, Sit to/from stand Upper Body Dressing : Sitting, Moderate assistance Lower Body Dressing: Maximal assistance, Sit to/from stand Toilet Transfer: Maximal assistance, Stand-pivot, RW Toileting- Clothing Manipulation and Hygiene: Maximal assistance, Sit  to/from stand Functional mobility during ADLs: Moderate assistance, Rolling walker    Mobility  Overal bed mobility: Needs Assistance Bed Mobility: Supine to Sit Supine to sit: Max assist, HOB elevated General bed mobility comments: Needed assist to elevate trunk and bring LEs off bed.    Transfers  Overall transfer level: Needs assistance Equipment used: Rolling walker (2 wheeled) Transfers: Sit to/from Stand, Stand Pivot Transfers Sit to Stand: From elevated surface, Max assist Stand pivot transfers: Max assist, From elevated surface General transfer comment: max A to power up from bed, balance support required in standing    Ambulation / Gait / Stairs / Wheelchair Mobility       Posture / Balance Balance Overall balance assessment: Needs assistance Sitting-balance support: No upper extremity supported, Feet supported Sitting balance-Leahy Scale: Poor Postural control: Posterior lean Standing balance support: Bilateral upper extremity supported, During functional activity Standing balance-Leahy Scale: Poor Standing balance comment: relies heavily on UE support    Special needs/care consideration BiPAP/CPAP: no CPM: no Continuous Drip IV: no Dialysis: yes        Days: normal is MWF; however holiday schedule (pt received Tuesday). ([place] next dialysis is Friday 29th) Life Vest: no Oxygen: yes currently (2L); new to O2 needs.  Special Bed: no Trach Size: no Wound Vac (area): no      Location: no Skin: ecchymosis on neck, flanks, and arms; surgical incision to abdomen (midline) per RN, left lower flank, 6 port entries (right upper superior, right mid, right lower lateral, right lower, left superior upper, and left mid).  Additional incisions noted with port entries on left lower abdomen and mid superior abdomen    Bowel mgmt: last BM: 02/16/18 Bladder mgmt: anuria Diabetic mgmt: no     Previous Home Environment Living Arrangements: Spouse/significant other Available  Help at Discharge: Family, Available 24 hours/day Type of Home: House Home Layout: Multi-level, Able to live on main level with bedroom/bathroom Alternate Level Stairs-Number of Steps: 1 Home Access: Stairs to enter [employer] of Steps: 1 Bathroom Shower/Tub: Multimedia programmer: Handicapped height Home Care Services: No Additional Comments: 1 daughter is local  Discharge Living Setting Plans for Discharge Living Setting: Patient's home, Lives with (comment)(wife) Type of Home at Discharge: House Discharge Home Layout: One level Discharge Home Access: Stairs to enter Entrance Stairs-Rails: None Entrance Stairs-Number of Steps: 1 Discharge Bathroom Shower/Tub: Walk-in shower Discharge Bathroom Toilet: Handicapped height Discharge Bathroom Accessibility: Yes How Accessible: Accessible via walker Does the patient have any problems obtaining your medications?: No  Social/Family/Support Systems Patient Roles: Spouse, Parent, Other (Comment)(parent to grown children) Contact Information: wife [name]): [phone]; daugther [name]): [phone] Anticipated Caregiver: both wife and 3 daugther live nearby Anticipated Caregiver's Contact Information: see above Ability/Limitations of Caregiver: Min A Caregiver Availability: 24/7 Discharge Plan Discussed with Primary Caregiver: Yes Is Caregiver In Agreement with Plan?: Yes Does Caregiver/Family have Issues with Lodging/Transportation while Pt is in Rehab?: No   Goals/Additional Needs Patient/Family Goal for Rehab: PT/OT: Supervision; SLP: NA Expected length of stay: 7-10 days Cultural Considerations: NA Dietary Needs: renal with fluid restriction, thin liquids; fluid restriction is 1200 mL fluid Equipment Needs: TBD Special Service Needs: HD Pt/Family Agrees to Admission and willing to participate: Yes Program Orientation Provided & Reviewed with Pt/Caregiver Including Roles  & Responsibilities: Yes(with pt,  wife, and daugther [name])  Barriers to Discharge: New oxygen, Other (comments)(pt is blind but is used to his home set up)   Decrease burden of Care through IP rehab admission: NA   Possible need for SNF placement upon discharge: not anticipated; pt has good family support and excellend prognosis for further progress.    Patient Condition: This patient's condition remains as documented in the consult dated 02/22/2018, in which the Rehabilitation Physician determined and documented that the patient's  condition is appropriate for intensive rehabilitative care in an inpatient rehabilitation facility pending family/caregiver support and establishment of baseline level of cognition. These areas have been addressed. Pt has 24/7 A at discharge as his wife is retired and can provide assistance as well as their 3 daughters who lives nearby. Per family, pt has had no change in cognition since surgery with the only change being increased lethargy from not sleeping well. Will admit to inpatient rehab today (02/22/18).  *Note Addendum on 11/29. Pt's admission to CIR was held on 11/27 due to new significant serosanguineous draining as well as new numbness and tingling in LLE. Pt was reassessed on 11/29 and felt to be appropriate for CIR admission on 02/24/18.    Preadmission Screen Completed By:  Jhonnie Garner, 02/24/2018 10:43 AM ______________________________________________________________________   Discussed status with Dr. Posey Pronto on 02/22/18 at 1:17PM and received telephone approval for admission today.  Admission Coordinator:  Jhonnie Garner, time 1:17PM/Date 02/22/18   NOTE ADDENDUM: 02/24/18 Discussed status with Dr. Posey Pronto on 02/24/18 at 10:42AM and approval for admission today.  Admission Coordinator:  Jhonnie Garner, time 10:43AM/Date 02/24/18

## 2018-02-21 NOTE — Progress Notes (Signed)
Inpatient Rehabilitation-Admissions Coordinator    Met with patient and his wife at the bedside (as well as daughter [name] over the phone) to discuss team's recommendation for inpatient rehabilitation. Shared booklets, expectations while in CIR, expected length of stay, and anticipated functional level at DC. Pt willing to pursue CIR and family very supportive.   AC will follow for medical readiness.   Jhonnie Garner, OTR/L  Rehab Admissions Coordinator  [phone] 02/21/2018 3:11 PM

## 2018-02-21 NOTE — Consult Note (Signed)
Physical Medicine and Rehabilitation Consult Reason for Consult:  Decreased functional mobility Referring Physician: Dr. Rocky Crafts   HPI: Alexander [name] is a 77 y.o.right handed male with history of diastolic congestive heart failure, 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 Per chart review and patient, patient lives with spouse. Used a Rollator prior to admission. One level home with ramped entry. Presented 02/17/2018 for robot assisted laparoscopic radical cystoprostatectomy, bilateral pelvic lymphadenectomy and bilateral nephroureterectomy per Dr. Holland Falling for bladder cancer. Hospital course pain management. Hemodialysis as per renal services. Therapy evaluations completed with recommendations of physical medicine rehabilitation consult.   Review of Systems  Constitutional: Negative for chills and fever.  HENT: Negative for hearing loss.   Eyes: Negative for blurred vision and double vision.  Respiratory: Positive for shortness of breath.   Cardiovascular: Negative for chest pain.  Gastrointestinal: Positive for constipation. Negative for nausea and vomiting.  Genitourinary: Negative for dysuria, flank pain and hematuria.  Musculoskeletal: Positive for myalgias.  Skin: Negative for rash.  Neurological: Positive for focal weakness (Baseline) and weakness.  Psychiatric/Behavioral: Positive for depression.  All other systems reviewed and are negative.  Past Medical History:  Diagnosis Date  . Anemia   . Aortic stenosis 06/15/12   TEE - EF 32-95%; grade 1 diastolic dysfunction; mild/mod aortic valve stenosis; Mitral valve had calcified annulus, mild pulm htn PA peak pressure 92mmHg  . Barrett's esophagus 05/2003  . Bradycardia 2017   St. [employer] 2240 Assurity dual-lead pacemaker  . Cancer ([place])    bladder and kidney  . Carpal tunnel syndrome, bilateral  11/03/2015  . CHF (congestive heart failure) ([place])   . Colon polyps   . Coronary artery disease   . CVA (cerebral infarction)    2004/affected left side  . Depression   . Diabetes mellitus without complication ([place])   . Diabetic peripheral neuropathy ([place]) 10/02/2015  . Diverticulosis   . Dyspnea    with exertion  . ESRD (end stage renal disease) on dialysis ([place])    "[facility]; [facility]; MWF" (05/12/2017)  . Failure to thrive syndrome, adult 10/30/2017  . GERD (gastroesophageal reflux disease)   . Glaucoma   . History of kidney stones   . Hyperlipidemia   . Hypertension   . Legally blind   . Macular degeneration    both eyes  . Orthostatic hypotension 09/09/2015  . Paroxysmal atrial fibrillation ([facility])   . Peptic ulcer    bleeding, 1969  . PONV (postoperative nausea and vomiting)   . Presence of permanent cardiac pacemaker   . S/P epidural steroid injection    last  injection over 10 years ago  . Seasonal allergies   . Tubular adenoma of colon 07/2001   Past Surgical History:  Procedure Laterality Date  . AV FISTULA PLACEMENT  2009  . BACK SURGERY    . CARDIOVERSION N/A 11/13/2015   Procedure: CARDIOVERSION;  Surgeon: Troy Sine, MD;  Location: [facility] ENDOSCOPY;  Service: Cardiovascular;  Laterality: N/A;  . CARDIOVERSION N/A 01/13/2016   Procedure: CARDIOVERSION;  Surgeon: Will Meredith [name], MD;  Location: [place];  Service: Cardiovascular;  Laterality: N/A;  . COLONOSCOPY WITH PROPOFOL N/A 01/05/2018   Procedure: COLONOSCOPY WITH PROPOFOL;  Surgeon: Ladene Artist, MD;  Location: [facility] ENDOSCOPY;  Service: Endoscopy;  Laterality: N/A;  . CORNEAL TRANSPLANT  1999   right eye  . CYSTOSCOPY  several  times   kidney stones  . CYSTOSCOPY W/ RETROGRADES Bilateral 12/13/2017   Procedure: CYSTOSCOPY WITH RETROGRADE PYELOGRAM;  Surgeon: Irine Seal, MD;  Location: [facility] ORS;  Service: Urology;  Laterality: Bilateral;  . EP IMPLANTABLE DEVICE N/A 03/11/2015   Procedure: Pacemaker Implant;   Surgeon: Will Meredith [name], MD;  [place];  Laterality: Left  . LAMINECTOMY  1969  . POLYPECTOMY  01/05/2018   Procedure: POLYPECTOMY;  Surgeon: Ladene Artist, MD;  Location: [facility] ENDOSCOPY;  Service: Endoscopy;;  . RIGHT/LEFT HEART CATH AND CORONARY ANGIOGRAPHY N/A 08/20/2016   Procedure: Right/Left Heart Cath and Coronary Angiography;  Surgeon: Burnell Blanks, MD;  Location: [place] CV LAB;  Service: Cardiovascular;  Laterality: N/A;  . ROBOT ASSITED LAPAROSCOPIC NEPHROURETERECTOMY Bilateral 02/17/2018   Procedure: XI ROBOT ASSITED LAPAROSCOPIC NEPHROURETERECTOMY;  Surgeon: Alexis Frock, MD;  Location: [facility];  Service: Urology;  Laterality: Bilateral;  . TEE WITHOUT CARDIOVERSION N/A 07/22/2016   Procedure: TRANSESOPHAGEAL ECHOCARDIOGRAM (TEE);  Surgeon: Skeet Latch, MD;  Location: [place];  Service: Cardiovascular;  Laterality: N/A;  . TEE WITHOUT CARDIOVERSION N/A 08/31/2016   Procedure: TRANSESOPHAGEAL ECHOCARDIOGRAM (TEE);  Surgeon: Burnell Blanks, MD;  Location: [place];  Service: Open Heart Surgery;  Laterality: N/A;  . TEE WITHOUT CARDIOVERSION N/A 12/01/2016   Procedure: TRANSESOPHAGEAL ECHOCARDIOGRAM (TEE);  Surgeon: Larey Dresser, MD;  Location: [facility] ENDOSCOPY;  Service: Cardiovascular;  Laterality: N/A;  . TONSILLECTOMY  1964  . TRANSCATHETER AORTIC VALVE REPLACEMENT, TRANSFEMORAL N/A 08/31/2016   Procedure: TRANSCATHETER AORTIC VALVE REPLACEMENT, TRANSFEMORAL;  Surgeon: Burnell Blanks, MD;  Location: [place];  Service: Open Heart Surgery;  Laterality: N/A;  . TRANSURETHRAL RESECTION OF BLADDER TUMOR N/A 12/13/2017   Procedure: TRANSURETHRAL RESECTION OF BLADDER TUMOR (TURBT);  Surgeon: Irine Seal, MD;  [facility]: [facility];  Service: Urology;  Laterality: N/A;   Family History  Problem Relation Age of Onset  . Stomach cancer Mother   . Hypertension Father        Died of heart attack  . Heart attack Father   . Stroke  Sister   . Heart disease Sister   . Cancer Brother   . Colon cancer Neg Hx    Social History:  reports that he quit smoking about 20 years ago. His smoking use included cigarettes. He has a 90.00 pack-year smoking history. He has never used smokeless tobacco. He reports that he does not drink alcohol or use drugs. Allergies:  Allergies  Allergen Reactions  . Penicillins Swelling and Rash    Has patient had a PCN reaction causing immediate rash, facial/tongue/throat swelling, SOB or lightheadedness with hypotension: Yes Has patient had a PCN reaction causing severe rash involving mucus membranes or skin necrosis: No Has patient had a PCN reaction that required hospitalization: No Has patient had a PCN reaction occurring within the last 10 years: No If all of the above answers are "NO", then may proceed with Cephalosporin use.   . Atorvastatin     MYALGIA   . Codeine Nausea Only  . Tramadol Nausea Only   Medications Prior to Admission  Medication Sig Dispense Refill  . acetaminophen (TYLENOL) 650 MG CR tablet Take 650 mg by mouth every 8 (eight) hours as needed for pain.    [name] b complex-vitamin c-folic acid (NEPHRO-VITE) 0.8 MG TABS Take 1 tablet by mouth daily.     . bromocriptine (PARLODEL) 5 MG capsule Take 5 mg by mouth every other day.     [name]  cetirizine (ZYRTEC) 10 MG tablet Take 10 mg by mouth at bedtime.     . cinacalcet (SENSIPAR) 60 MG tablet Take 60 mg by mouth at bedtime.    . clindamycin (CLEOCIN) 150 MG capsule Take 600 mg by mouth See admin instructions. Take 600 mg 1 hour prior to dental work  0  . DIGOX 125 MCG tablet TAKE 1/2 TABLET BY MOUTH EVERY DAY (Patient taking differently: Take 0.0625 mg by mouth daily. ) 15 tablet 4  . diltiazem (CARDIZEM CD) 120 MG 24 hr capsule Take 120 mg by mouth daily.     . dorzolamide-timolol (COSOPT) 22.3-6.8 MG/ML ophthalmic solution Place 1 drop into both eyes 2 (two) times daily.     [name] enoxaparin (LOVENOX) 80 MG/0.8ML injection Inject  0.8 mLs (80 mg total) into the skin daily. (Patient taking differently: Inject 80 mg into the skin every evening. ) 10 Syringe 0  . gabapentin (NEURONTIN) 100 MG capsule Take 200 mg by mouth at bedtime.     [name] HYDROcodone-acetaminophen (NORCO/VICODIN) 5-325 MG tablet Take 1 tablet by mouth every 4 (four) hours as needed for pain.  0  . latanoprost (XALATAN) 0.005 % ophthalmic solution Place 1 drop into both eyes at bedtime.    . Nutritional Supplements (NEPRO) LIQD Take 237 mLs by mouth every Monday, Wednesday, and Friday with hemodialysis.     [name] omeprazole (PRILOSEC) 20 MG capsule Take 20 mg by mouth daily.    . pravastatin (PRAVACHOL) 10 MG tablet Take 1 tablet (10 mg total) by mouth daily. 30 tablet 0  . predniSONE (DELTASONE) 5 MG tablet Take 5 mg by mouth at bedtime.   1  . sertraline (ZOLOFT) 50 MG tablet Take 50 mg by mouth daily.  5  . sevelamer carbonate (RENVELA) 800 MG tablet Take 2 tablets (1,600 mg total) by mouth 3 (three) times daily with meals. 90 tablet 0  . STIOLTO RESPIMAT 2.5-2.5 MCG/ACT AERS Inhale 2 puffs into the lungs daily as needed for shortness of breath.  5  . warfarin (COUMADIN) 5 MG tablet TAKE 1/2 TO 1 TABLET BY MOUTH DAILY AS DIRECTED BY COUMADIN CLINIC (Patient taking differently: Take 2.5-5 mg by mouth See admin instructions. Take 5 mg once daily in the evening on Sun, Tues, Thur, and Sat.  Take 2.5 mg once daily in the evening on Mon, Wed, and Fri) 90 tablet 1  . acetaminophen (TYLENOL) 500 MG tablet Take 1,000 mg by mouth every 6 (six) hours as needed for moderate pain.    . metoprolol tartrate (LOPRESSOR) 25 MG tablet Take 12.5 mg by mouth 2 (two) times daily as needed for high blood pressure.  0    Home: Home Living Family/patient expects to be discharged to:: Private residence Living Arrangements: Spouse/significant other Available Help at Discharge: Family, Available 24 hours/day Type of Home: House Home Access: Stairs to enter Technical brewer of  Steps: 1 Home Layout: Multi-level Alternate Level Stairs-Number of Steps: 1 Bathroom Shower/Tub: Multimedia programmer: Handicapped height Home Equipment: Environmental consultant - 4 wheels, Shower seat, Environmental consultant - 2 wheels, Hand held shower head, Wheelchair - manual Additional Comments: 1 daughter is local  Functional History: Prior Function Level of Independence: Needs assistance Gait / Transfers Assistance Needed: Ambulates with rollator around home ADL's / Homemaking Assistance Needed: Independent with bathing and dressing. Wife may lay clothes out Communication / Swallowing Assistance Needed: has some aphasia  Functional Status:  Mobility: Bed Mobility Overal bed mobility: Needs Assistance Bed Mobility: Supine to Sit  Supine to sit: Mod assist, +2 for physical assistance General bed mobility comments: Needed assist to elevate trunk and bring LEs off bed. Transfers Overall transfer level: Needs assistance Equipment used: Rolling walker (2 wheeled) Transfers: Sit to/from Stand, [employer] Transfers Sit to Stand: Mod assist, +2 physical assistance, From elevated surface Stand pivot transfers: Mod assist, +2 physical assistance General transfer comment: Pt needed power up to stand +2 assist.   Pt with flexed posture.  Was able to take pivotal steps around to chair.   Pt with posterior lean needing mod assist for stability throughout.        ADL:    Cognition: Cognition Overall Cognitive Status: Within Functional Limits for tasks assessed Orientation Level: Oriented X4 Cognition Arousal/Alertness: Awake/alert Behavior During Therapy: WFL for tasks assessed/performed Overall Cognitive Status: Within Functional Limits for tasks assessed  Blood pressure (!) 148/49, pulse 70, temperature 97.9 F (36.6 C), temperature source Oral, resp. rate 18, height 5\' 10"  (1.778 m), weight 87.8 kg, SpO2 100 %. Physical Exam  Vitals reviewed. Constitutional: He appears well-developed and  well-nourished.  HENT:  Head: Normocephalic and atraumatic.  Eyes: EOM are normal. Right eye exhibits no discharge. Left eye exhibits no discharge.  Neck: Normal range of motion. Neck supple. No thyromegaly present.  Cardiovascular:  Irregularly irregular  Respiratory: Effort normal.  Limited inspiratory effort but clear to auscultation  GI: Soft. Bowel sounds are normal.  Musculoskeletal:  No edema or tenderness in extremities  Neurological: He is alert.  Follow simple commands Oriented x2 Motor: Right upper extremity/right lower extremity: 5/5 proximal distal Eft upper extremity: 4/5 proximal distal Lower extremity: Hip flexion 2+/5, knee extension 3/5, ankle dorsiflexion 4 medicine/5 Sensation diminished light touch left upper and left lower extremity.  Skin: Skin is warm and dry.  Surgical dressing in place  Psychiatric: His affect is blunt. His speech is delayed and slurred. He is slowed. Cognition and memory are impaired.  Confused    Results for orders placed or performed during the hospital encounter of 02/16/18 (from the past 24 hour(s))  Phosphorus     Status: None   Collection Time: 02/20/18  8:29 AM  Result Value Ref Range   Phosphorus 4.4 2.5 - 4.6 mg/dL  Glucose, capillary     Status: Abnormal   Collection Time: 02/20/18 11:32 AM  Result Value Ref Range   Glucose-Capillary 140 (H) 70 - 99 mg/dL  Glucose, capillary     Status: Abnormal   Collection Time: 02/20/18  4:53 PM  Result Value Ref Range   Glucose-Capillary 124 (H) 70 - 99 mg/dL  Prepare RBC     Status: None   Collection Time: 02/20/18  8:31 PM  Result Value Ref Range   Order Confirmation      ORDER PROCESSED BY BLOOD BANK Performed at [facility], [place] [address], [place], [place] [address]   Glucose, capillary     Status: Abnormal   Collection Time: 02/20/18  9:47 PM  Result Value Ref Range   Glucose-Capillary 106 (H) 70 - 99 mg/dL  Glucose, capillary     Status: Abnormal   Collection  Time: 02/21/18  6:23 AM  Result Value Ref Range   Glucose-Capillary 126 (H) 70 - 99 mg/dL   No results found.  Assessment/Plan: Diagnosis: Debility Labs independently reviewed.  Records reviewed and summated above.  1. Does the need for close, 24 hr/day medical supervision in concert with the patient's rehab needs make it unreasonable for this patient to  be served in a less intensive setting? Yes  2. Co-Morbidities requiring supervision/potential complications: diastolic congestive heart failure (monitor for signs and symptoms of fluid overload), CAD with bradycardia status post St. Jude dual lead pacemaker 2017, CVA 2004 with mild left-sided residual weakness, DM (Monitor in accordance with exercise and adjust meds as necessary), bladder cancer, post-op pain management (Biofeedback training with therapies to help reduce reliance on opiate pain medications, monitor pain control during therapies, and sedation at rest and titrate to maximum efficacy to ensure participation and gains in therapies), atrial fibrillation (continue meds, monitor heart rate with increased physical activity), ESRD (Recs per nephro) 3. Due to safety, disease management, medication administration, pain management and patient education, does the patient require 24 hr/day rehab nursing? Yes 4. Does the patient require coordinated care of a physician, rehab nurse, PT (1-2 hrs/day, 5 days/week) and OT (1-2 hrs/day, 5 days/week) to address physical and functional deficits in the context of the above medical diagnosis(es)? Yes Addressing deficits in the following areas: balance, endurance, locomotion, strength, transferring, bathing, dressing, toileting and psychosocial support 5. Can the patient actively participate in an intensive therapy program of at least 3 hrs of therapy per day at least 5 days per week? Yes 6. The potential for patient to make measurable gains while on inpatient rehab is excellent 7. Anticipated functional  outcomes upon discharge from inpatient rehab are supervision  with PT, supervision with OT, n/a with SLP. 8. Estimated rehab length of stay to reach the above functional goals is: 7-10 days. 9. Anticipated D/C setting: Home 10. Anticipated post D/C treatments: HH therapy and Home excercise program 11. Overall Rehab/Functional Prognosis: excellent  RECOMMENDATIONS: This patient's condition is appropriate for continued rehabilitative care in the following setting: Potentially CIR.  Will need to inquire about caregiver support at discharge as well as baseline level of cognition. Patient has agreed to participate in recommended program. Yes Note that insurance prior authorization may be required for reimbursement for recommended care.  Comment: Rehab Admissions Coordinator to follow up.   I have personally performed a face to face diagnostic evaluation, including, but not limited to relevant history and physical exam findings, of this patient and developed relevant assessment and plan.  Additionally, I have reviewed and concur with the physician assistant's documentation above.   Delice Lesch, MD, ABPMR Lavon Paganini Angiulli, PA-C 02/21/2018

## 2018-02-21 NOTE — Progress Notes (Signed)
Urology Progress Note   4 Days Post-Op  Subjective: Pain controlled. Reports subjective clinical improvement daily. Passing flatus, no BM. Sat in the chair much of the day yesterday after working with physical therapy. Daughter reports fatigue after being out of bed yesterday. Denies nausea, tolerating small amount of renal diet. Did not like the cheese sandwich.   Objective: Vital signs in last 24 hours: Temp:  [97.4 F (36.3 C)-98.7 F (37.1 C)] 97.9 F (36.6 C) (11/26 0341) Pulse Rate:  [67-80] 70 (11/26 0022) Resp:  [15-21] 18 (11/26 0022) BP: (125-151)/(47-63) 148/49 (11/26 0341) SpO2:  [97 %-100 %] 100 % (11/26 0022) Arterial Line BP: (145)/(48) 145/48 (11/26 0022)  Intake/Output from previous day: 11/25 0701 - 11/26 0700 In: -  Out: 155 [Drains:155] Intake/Output this shift: No intake/output data recorded.  Physical Exam:  General: Alert and oriented CV: RRR Lungs: NWOB Abdomen: Soft, appropriately tender. Non distended. Incisions c/d/i. JP dressing in place. Soft tissue edema and ecchymosis on left flank and not significantly tender.  Ext: NT, No erythema  Lab Results: Recent Labs    02/19/18 0436 02/20/18 0533  HGB 7.4* 7.3*  HCT 24.2* 24.3*   BMET Recent Labs    02/19/18 0436 02/20/18 0533  NA 133* 134*  K 4.2 4.2  CL 97* 98  CO2 22 27  GLUCOSE 134* 168*  BUN 42* 24*  CREATININE 6.88* 4.95*  CALCIUM 7.4* 7.4*     Studies/Results: No results found.  Assessment/Plan:  77 y.o. male s/p adrenal-sparing bilateral nephrectomies, cystectomy. Overall doing well post-op, although had issues with asymptomatic hypotension, possibly 2/2 effects of anesthesia, now resolved with stable BPs.   - Labs pending this AM.  - Appreciate nephrology's assistance with this patient.  - Continue renal diet.  - Given significant improvement in blood pressures, defer ongoing need for arterial line to nephrology and if they want to continue for monitoring during  dialysis.  - SCDs for DVT ppx. - Continue colace for bowel regimen.  - Rehab consult placed and discussed with patient and family. Patient is in agreement with inpatient rehabilitation.  - Plan for dialysis today with pRBC transfusion. - JP drain discontinued. Continue to monitor left flank ecchymosis.    LOS: 4 days   Dorothey Baseman 02/21/2018, 6:38 AM

## 2018-02-21 NOTE — Progress Notes (Signed)
Verbal order per Dr. Tresa Moore to d/c A-line. Jerald Kief, RN

## 2018-02-21 NOTE — Progress Notes (Addendum)
[facility] Progress Note  Subjective: on HD now, no new c/o, sat in chair for a good while yest  Vitals:   02/21/18 1000 02/21/18 1025 02/21/18 1030 02/21/18 1040  BP: 134/73 (!) 143/47 (!) 142/56 (!) 142/43  Pulse: 69 72 72 63  Resp: (!) 23 17 (!) 21 (!) 22  Temp: (!) 97.5 F (36.4 C) (!) 97.5 F (36.4 C) (!) 97.5 F (36.4 C) (!) 97.5 F (36.4 C)  TempSrc: Oral Oral Oral Oral  SpO2:      Weight:      Height:        Inpatient medications: . sodium chloride   Intravenous Once  . sodium chloride   Intravenous Once  . bromocriptine  5 mg Oral QODAY  . Chlorhexidine Gluconate Cloth  6 each Topical Q0600  . Chlorhexidine Gluconate Cloth  6 each Topical Q0600  . cinacalcet  60 mg Oral Q supper  . digoxin  62.5 mcg Oral Daily  . docusate sodium  100 mg Oral BID  . dorzolamide-timolol  1 drop Both Eyes BID  . gabapentin  200 mg Oral QHS  . insulin aspart  0-15 Units Subcutaneous TID WC  . latanoprost  1 drop Both Eyes QHS  . pantoprazole  40 mg Oral Daily  . pravastatin  10 mg Oral Daily  . predniSONE  5 mg Oral QHS  . sertraline  50 mg Oral Daily  . sevelamer carbonate  1,600 mg Oral TID WC   . sodium chloride    . sodium chloride     sodium chloride, sodium chloride, acetaminophen, alteplase, diphenhydrAMINE **OR** diphenhydrAMINE, heparin, lidocaine (PF), lidocaine-prilocaine, ondansetron, oxyCODONE, pentafluoroprop-tetrafluoroeth  Iron/TIBC/Ferritin/ %Sat No results found for: IRON, TIBC, FERRITIN, IRONPCTSAT  Exam:  alert and lying flat, no distress  no jvd Chest cta bilat, no rales or wheezing Cor reg no mrg  Abd soft ntnd mild tenderness, dec'd BS  Ext no edema  NF, Ox3  L RC AVF+ bruit    Home meds:  - cinacalcet 60 hs/ sevelamer carb 1600 tid ac  - warfarin daily/ metoprolol tartrate 12.5 bid prn hbp/ diltiazem 120 qd/ digoxin 62.5ug qd  - gabapentin 200 hx/ hydrocodone prn/ sertraline 50 qd  - omeprazole 20 qd/ pravastatin 10 qd  -  prednisone 5 qd/ stiolot respimat 2 puffs qd   Dialysis: [facility]WF [facility]  4h  86.5kg  2/2 bath   Hep 2800   Qb 450  - hect 3ug  - mircera 150 q 2wks, last ?  Assessment/ Plan 1. Bladder cancer, "aggressive variant" - underwent cystoprostatectomy+ bilateral nephrectomy 11/22 for this.  Had post op hypotension which resolved < 24hrs. Per urology   2. ESRD: HD MWF. HD today on holiday schedule. 2-3 L UF.  3. Anemia due to CKD + ABL:  Symptomatic anemia, to get 2u prbc in HD today.  4. Hypotension: resolved 5. MBD ckd: Resumed phosphorus binders and Hectorol with dialysis. 6. AFib - not getting home BB or diltiazem for rate control here but heart rates now are in the 70's.  Resume BB/ dilt as needed.     Kelly Splinter MD [facility] pager [phone]   02/21/2018, 10:54 AM   Recent Labs  Lab 02/16/18 1812  02/18/18 1545  02/20/18 0533 02/20/18 0829 02/21/18 0630  NA 140   < >  --    < > 134*  --  133*  K 3.2*   < >  --    < >  4.2  --  4.1  CL 95*   < >  --    < > 98  --  98  CO2 33*   < >  --    < > 27  --  24  GLUCOSE 94   < >  --    < > 168*  --  128*  BUN 12   < >  --    < > 24*  --  41*  CREATININE 3.08*   < >  --    < > 4.95*  --  6.77*  CALCIUM 8.6*   < >  --    < > 7.4*  --  7.6*  PHOS  --   --   --   --   --  4.4  --   ALBUMIN 3.4*  --  2.6*  --   --   --   --    < > = values in this interval not displayed.   Recent Labs  Lab 02/16/18 1812  AST 20  ALT 23  ALKPHOS 55  BILITOT 1.1  PROT 6.5   Recent Labs  Lab 02/16/18 1812  02/20/18 0533 02/21/18 0630  WBC 7.2  --   --   --   HGB 10.7*   < > 7.3* 7.3*  HCT 35.2*   < > 24.3* 23.5*  MCV 104.5*  --   --   --   PLT 157  --   --   --    < > = values in this interval not displayed.

## 2018-02-21 NOTE — Progress Notes (Signed)
OT Cancellation Note  Patient Details Name: RYLIE KNIERIM MRN: [id number] DOB: May 19, 1940   Cancelled Treatment:    Reason Eval/Treat Not Completed: Patient at procedure or test/ unavailable;Other: Pt off floor at Hemodialysis. Will check back for OT assessment as schedule allows.  Carlynn Herald Pookela Sellin Beth Dixon, OTR/L  02/21/2018, 8:52 AM

## 2018-02-21 NOTE — Evaluation (Signed)
Occupational Therapy Evaluation Patient Details Name: Alexander [name] MRN: [id number] DOB: May 16, 1940 Today's Date: 02/21/2018    History of Present Illness 77 year old Caucasian man with past medical history significant for diabetes mellitus, congestive heart failure, coronary artery disease, history of aortic stenosis and end-stage renal disease on hemodialysis on a Monday/Wednesday/Friday schedule at [facility].  On 11/22, pt underwent robot-assisted laparoscopic radical cystoprostatectomy, bilateral pelvic lymphadectomy and bilateral nephroureterectomy for aggressive variant bladder cancer.  That night, he was noted to have some hypotension and is on a gentle rate of maintenance fluids.  He reports some breakthrough pain in his abdomen and denies any chest pain or shortness of breath.  He was then transferred to [facility]   Clinical Impression   Pt admitted with above dx and now presenting with generalized deconditioning and balance deficits that limits his safety and independence with ADLs. Pt would benefit from CIR stay to maximize return to PLOF. Pt motivated and cooperative with therapy.     Follow Up Recommendations  CIR    Equipment Recommendations  Other (comment)(defer to next venue)    Recommendations for Other Services Rehab consult;PT consult;Speech consult     Precautions / Restrictions Precautions Precautions: Fall Precaution Comments: Pt is blind, can seen peripherally on the L slightly Restrictions Weight Bearing Restrictions: No      Mobility Bed Mobility Overal bed mobility: Needs Assistance Bed Mobility: Supine to Sit     Supine to sit: Max assist;HOB elevated     General bed mobility comments: Needed assist to elevate trunk and bring LEs off bed.  Transfers Overall transfer level: Needs assistance Equipment used: Rolling walker (2 wheeled) Transfers: Sit to/from [facility] Sit to Stand: From  elevated surface;Max assist Stand pivot transfers: Max assist;From elevated surface       General transfer comment: max A to power up from bed, balance support required in standing    Balance Overall balance assessment: Needs assistance Sitting-balance support: No upper extremity supported;Feet supported Sitting balance-Leahy Scale: Poor   Postural control: Posterior lean Standing balance support: Bilateral upper extremity supported;During functional activity Standing balance-Leahy Scale: Poor Standing balance comment: relies heavily on UE support                           ADL either performed or assessed with clinical judgement   ADL Overall ADL's : Needs assistance/impaired Eating/Feeding: Minimal assistance;Bed level       Upper Body Bathing: Minimal assistance;Bed level   Lower Body Bathing: Maximal assistance;Sit to/from stand   Upper Body Dressing : Sitting;Moderate assistance   Lower Body Dressing: Maximal assistance;Sit to/from stand   Toilet Transfer: Maximal assistance;Stand-pivot;RW   Toileting- Clothing Manipulation and Hygiene: Maximal assistance;Sit to/from stand       Functional mobility during ADLs: Moderate assistance;Rolling walker       Vision Baseline Vision/History: Legally blind;Macular Degeneration Patient Visual Report: No change from baseline Vision Assessment?: Yes Eye Alignment: Impaired (comment) Ocular Range of Motion: Impaired-to be further tested in functional context Alignment/Gaze Preference: Gaze right Tracking/Visual Pursuits: Unable to hold eye position out of midline;Impaired - to be further tested in functional context Visual Fields: Impaired-to be further tested in functional context            Pertinent Vitals/Pain Pain Assessment: Faces Faces Pain Scale: Hurts little more Pain Location: stomach Pain Descriptors / Indicators: Aching Pain Intervention(s): Limited activity within patient's tolerance  Hand Dominance Right   Extremity/Trunk Assessment Upper Extremity Assessment Upper Extremity Assessment: Generalized weakness(3+/5 BUE)       Cervical / Trunk Assessment Cervical / Trunk Assessment: Kyphotic   Communication Communication Communication: No difficulties   Cognition Arousal/Alertness: Lethargic Behavior During Therapy: WFL for tasks assessed/performed Overall Cognitive Status: Within Functional Limits for tasks assessed                                     General Comments  Pt fatigued from earlier HD session            Home Living Family/patient expects to be discharged to:: Private residence Living Arrangements: Spouse/significant other Available Help at Discharge: Family;Available 24 hours/day Type of Home: House Home Access: Stairs to enter [employer] of Steps: 1   Home Layout: Multi-level;Able to live on main level with bedroom/bathroom Alternate Level Stairs-Number of Steps: 1   Bathroom Shower/Tub: Occupational psychologist: Handicapped height     Home Equipment: Environmental consultant - 4 wheels;Shower seat;Walker - 2 wheels;Hand held shower head;Wheelchair - manual   Additional Comments: 1 daughter is local      Prior Functioning/Environment Level of Independence: Needs assistance  Gait / Transfers Assistance Needed: Ambulates with rollator around home ADL's / Homemaking Assistance Needed: Independent with bathing and dressing. Wife may lay clothes out Communication / Swallowing Assistance Needed: has some aphasia  Comments: reports no residual L weakness        OT Problem List: Decreased activity tolerance;Decreased strength;Impaired balance (sitting and/or standing);Impaired vision/perception;Decreased coordination;Pain;Cardiopulmonary status limiting activity;Decreased safety awareness;Decreased knowledge of use of DME or AE;Decreased knowledge of precautions;Impaired UE functional use      OT  Treatment/Interventions: Self-care/ADL training;Therapeutic exercise;Neuromuscular education;Energy conservation;Manual therapy;DME and/or AE instruction;Modalities;Therapeutic activities;Visual/perceptual remediation/compensation;Patient/family education;Balance training    OT Goals(Current goals can be found in the care plan section) Acute Rehab OT Goals Patient Stated Goal: to go home OT Goal Formulation: With patient/family Time For Goal Achievement: 03/05/18 Potential to Achieve Goals: Good  OT Frequency: Min 2X/week    AM-PAC OT "6 Clicks" Daily Activity     Outcome Measure Help from another person eating meals?: A Little Help from another person taking care of personal grooming?: A Little Help from another person toileting, which includes using toliet, bedpan, or urinal?: A Lot Help from another person bathing (including washing, rinsing, drying)?: A Little Help from another person to put on and taking off regular upper body clothing?: A Little Help from another person to put on and taking off regular lower body clothing?: A Lot 6 Click Score: 16   End of Session Equipment Utilized During Treatment: Rolling walker;Oxygen Nurse Communication: Mobility status  Activity Tolerance: Patient limited by fatigue Patient left: in bed;with family/visitor present;with call bell/phone within reach  OT Visit Diagnosis: Unsteadiness on feet (R26.81);Muscle weakness (generalized) (M62.81);Low vision, both eyes (H54.2)                Time: 1427-1450 OT Time Calculation (min): 23 min Charges:  OT General Charges $OT Visit: 1 Visit OT Evaluation $OT Eval Moderate Complexity: 1 Mod OT Treatments $Self Care/Home Management : 8-22 mins  Curtis Sites OTR/L 02/21/2018, 2:58 PM

## 2018-02-22 ENCOUNTER — Encounter: Payer: Self-pay | Admitting: Occupational Therapy

## 2018-02-22 LAB — BPAM RBC
Blood Product Expiration Date: 201912122359
Blood Product Expiration Date: 201912202359
ISSUE DATE / TIME: 201911260943
ISSUE DATE / TIME: 201911260943
UNIT TYPE AND RH: 5100
Unit Type and Rh: 5100

## 2018-02-22 LAB — TYPE AND SCREEN
ABO/RH(D): O POS
Antibody Screen: NEGATIVE
Unit division: 0
Unit division: 0

## 2018-02-22 LAB — BASIC METABOLIC PANEL
ANION GAP: 9 (ref 5–15)
BUN: 24 mg/dL — ABNORMAL HIGH (ref 8–23)
CO2: 29 mmol/L (ref 22–32)
Calcium: 7.9 mg/dL — ABNORMAL LOW (ref 8.9–10.3)
Chloride: 97 mmol/L — ABNORMAL LOW (ref 98–111)
Creatinine, Ser: 4.46 mg/dL — ABNORMAL HIGH (ref 0.61–1.24)
GFR calc Af Amer: 14 mL/min — ABNORMAL LOW (ref >60)
GFR, EST NON AFRICAN AMERICAN: 12 mL/min — AB (ref >60)
GLUCOSE: 127 mg/dL — AB (ref 70–99)
POTASSIUM: 4 mmol/L (ref 3.5–5.1)
Sodium: 135 mmol/L (ref 135–145)

## 2018-02-22 LAB — GLUCOSE, CAPILLARY
GLUCOSE-CAPILLARY: 115 mg/dL — AB (ref 70–99)
GLUCOSE-CAPILLARY: 147 mg/dL — AB (ref 70–99)
GLUCOSE-CAPILLARY: 124 mg/dL — AB (ref 70–99)
Glucose-Capillary: 116 mg/dL — ABNORMAL HIGH (ref 70–99)

## 2018-02-22 LAB — HEMOGLOBIN AND HEMATOCRIT, BLOOD
HCT: 31.3 % — ABNORMAL LOW (ref 39.0–52.0)
Hemoglobin: 9.6 g/dL — ABNORMAL LOW (ref 13.0–17.0)

## 2018-02-22 MED ORDER — OXYCODONE-ACETAMINOPHEN 5-325 MG PO TABS: 1.0000 | ORAL_TABLET | Freq: Four times a day (QID) | ORAL | 0 refills | Status: DC | PRN

## 2018-02-22 MED ORDER — SENNOSIDES-DOCUSATE SODIUM 8.6-50 MG PO TABS: 1.0000 | ORAL_TABLET | Freq: Two times a day (BID) | ORAL | 0 refills | Status: DC

## 2018-02-22 MED ORDER — SENNOSIDES-DOCUSATE SODIUM 8.6-50 MG PO TABS
1.0000 | ORAL_TABLET | Freq: Two times a day (BID) | ORAL | Status: DC
  Administered 2018-02-22 – 2018-02-24 (×4): 1 via ORAL
  Filled 2018-02-22 (×4): qty 1

## 2018-02-22 NOTE — Progress Notes (Addendum)
[place] PHYSICAL MEDICINE & REHABILITATION PROGRESS NOTE  Subjective/Complaints: Patient seen laying in bed this morning.  He is slow to arouse.  Family states that patient had a "active night".  Once he does arouse he is able to answer basic questions with increased time.  ROS: + Left lower extremity numbness and tingling with increased weakness.  Limited due to cognition, but appears to deny CP, shortness of breath, nausea, vomiting, diarrhea.  Objective: Vital Signs: Blood pressure (!) 120/55, pulse 68, temperature 98.9 F (37.2 C), temperature source Oral, resp. rate 18, height 5\' 10"  (1.778 m), weight 89.2 kg, SpO2 96 %. No results found. Recent Labs    02/21/18 0630 02/22/18 0327  HGB 7.3* 9.6*  HCT 23.5* 31.3*   Recent Labs    02/21/18 0630 02/22/18 0327  NA 133* 135  K 4.1 4.0  CL 98 97*  CO2 24 29  GLUCOSE 128* 127*  BUN 41* 24*  CREATININE 6.77* 4.46*  CALCIUM 7.6* 7.9*    Physical Exam: BP (!) 120/55 (BP Location: Right Arm)   Pulse 68   Temp 98.9 F (37.2 C) (Oral)   Resp 18   Ht 5\' 10"  (1.778 m)   Wt 89.2 kg   SpO2 96%   BMI 28.22 kg/m  Constitutional: No distress . Vital signs reviewed. HENT: Normocephalic.  Atraumatic. Eyes: EOMI. No discharge. Cardiovascular: RRR. No JVD. Respiratory: CTA Bilaterally. Normal effort.  + [place]. GI: BS +. Non-distended. Musc: No edema or tenderness in extremities. Neurology: Slow to arouse Able to follow simple commands Motor: Bilateral upper extremities: 5/5 proximal distal Right lower extremity: 5/5 proximal distal Left lower externa: Hip flexion, knee extension 2+/5, ankle dorsiflexion 4+/5 Psych: Slow.  Delayed.  Assessment/Plan:  1.  History of bladder cancer status post resection with debility  Continue therapies  Patient noted to have significant serosanguineous drainage, which is new per family and nursing along with new numbness and tingling in left lower extremity, discussed with PA.  Continue  PT/OT  Hold CIR admission for today and monitor for medical stability  2.  ESRD  Recommendations per nephrology  3.  Diabetes mellitus type 2 in nonobese  Monitor in accordance with exercise and adjust meds as necessary  4.  Atrial fibrillation  Consider holding anticoagulation until bleeding resolves.  LOS: 5 days A FACE TO FACE EVALUATION WAS PERFORMED  Alexandre Lightsey Lorie Phenix 02/22/2018, 5:03 PM

## 2018-02-22 NOTE — Discharge Instructions (Signed)
1.  Activity:  You are encouraged to ambulate frequently (about every hour during waking hours) to help prevent blood clots from forming in your legs or lungs.  However, you should not engage in any heavy lifting (> 10-15 lbs), strenuous activity, or straining. 2. Diet: You should advance your diet as instructed by your physician.  It will be normal to have some bloating, nausea, and abdominal discomfort intermittently. 3. Prescriptions:  You will be provided a prescription for pain medication to take as needed.  If your pain is not severe enough to require the prescription pain medication, you may take extra strength Tylenol instead which will have less side effects.  You should also take a prescribed stool softener to avoid straining with bowel movements as the prescription pain medication may constipate you. 4. Incisions: All stithes are dissolvable. It is expected that the prior drain site on left side will continue to drain pink-tinged fluid. This will continue to improve and likely stop over several days. 5. Restart Coumadin / Warfarin after output from prior drain site has stopped completely x 24 hours.  6. What to call us about: You should call the office [phone]) if you develop fever > 101 or develop persistent vomiting.

## 2018-02-22 NOTE — Progress Notes (Addendum)
Inpatient Rehabilitation-Admissions Coordinator   Admission to CIR has been placed on hold. Concerns have been communicated to RN. AC has spoken to family in the room who understand reason behind holding on admission. AC will follow up with pt and his family Friday for possible admit, pending medical readiness.   Please call if questions  Jhonnie Garner, OTR/L  Rehab Admissions Coordinator  [phone] 02/22/2018 4:47 PM

## 2018-02-22 NOTE — Discharge Summary (Signed)
[facility] Discharge Summary  Admit date: 02/16/2018  Discharge date and time: 02/22/18   Discharge to: Inpatient rehabilitation   Discharge Service: Urology  Discharge Attending Physician:  Alexis Frock, MD  Discharge  Diagnoses: Urothelial carcinoma, End-stage renal disease   Secondary Diagnosis: Active Problems:   Bladder cancer ([place])   Postoperative pain   Chronic diastolic congestive heart failure (HCC)   Coronary artery disease involving native coronary artery of native heart without angina pectoris   Status post placement of cardiac pacemaker   History of CVA with residual deficit   Diabetes mellitus type 2 in nonobese [facility])   Atrial fibrillation ([place])   OR Procedures: Procedure(s): ROBOT ASSISTED LAPAROSCOPIC RADICAL CYSTOPROSTATECTOMY WITH BILATERAL PELVIC LYMPHADECTOMY XI ROBOT ASSITED LAPAROSCOPIC NEPHROURETERECTOMY 02/17/2018   Ancillary Procedures: None   Discharge Day Services: The patient was seen and examined by the Urology team both in the morning and immediately prior to discharge.  Vital signs and laboratory values were stable and within normal limits.  The physical exam was benign and unchanged and all surgical wounds were examined.  Discharge instructions were explained and all questions answered.  Subjective  No acute events overnight. Pain Controlled. No fever or chills.  Objective Patient Vitals for the past 8 hrs:  BP Temp Temp src Pulse Resp SpO2  02/22/18 1157 139/62 97.7 F (36.5 C) Oral 64 18 100 %  02/22/18 0935 - - - 71 - -  02/22/18 0756 (!) 127/54 97.8 F (36.6 C) Oral 69 (!) 22 100 %   No intake/output data recorded.  General Appearance:        No acute distress Lungs:                       Normal work of breathing on room air Heart:                                Regular rate and rhythm Abdomen:                         Soft, non-tender, non-distended, incisions c/d/i, JP site dressed with gauze with stable ecchymosis on  left flank.  Extremities:                      Warm and well perfused   Hospital Course:  The patient underwent bilateral nephroureterectomy and cystectomy with lymph node dissection on 02/17/2018.  The patient tolerated the procedure well, was extubated in the OR, and afterwards was taken to the PACU for routine post-surgical care. When stable the patient was transferred to the step-down unit. He experienced < 24 hours of post-operative hypotension for which he received fluid boluses and a blood transfusion.   On POD1, he was transferred to [facility] for co-management by nephrology and dialysis. He subsequently underwent dialysis on POD2 and POD4. He received a second blood transfusion on POD4.   From a surgical standpoint, the patient did well postoperatively. The patient's diet was slowly advanced and at the time of discharge was tolerating a renal diet. He was passing flatus at the time of discharge. The patient was discharged home 5 Days Post-Op, at which point was tolerating a renal solid diet, have adequate pain control with P.O. pain medication, and could ambulate with assistance. He was discharged to inpatient rehabilitation after physical therapy and occupational therapy recommended continued rehab. The  patient will follow up with [place] for post op check.   He may resume warfarin after drainage from prior left sided incision has completely stopped x 24 hours.   Condition at Discharge: Improved

## 2018-02-22 NOTE — Progress Notes (Signed)
Inpatient Rehabilitation-Admissions Coordinator   [facility] has received medical approval from Dr. Tresa Moore for admit to CIR today. Pt, family and RN aware of plan. AC will contact CM and SW.   Please call if questions.   Jhonnie Garner, OTR/L  Rehab Admissions Coordinator  [phone] 02/22/2018 1:22 PM

## 2018-02-22 NOTE — Progress Notes (Addendum)
Urology Progress Note   5 Days Post-Op  Subjective: Pain controlled. Tolerating renal diet. Worked with OT yesterday and received HD. A-line removed yesterday.   Objective: Vital signs in last 24 hours: Temp:  [97.4 F (36.3 C)-98.5 F (36.9 C)] 98.5 F (36.9 C) (11/27 0306) Pulse Rate:  [63-77] 75 (11/27 0306) Resp:  [15-26] 19 (11/27 0306) BP: (118-172)/(37-84) 119/60 (11/27 0306) SpO2:  [97 %-100 %] 97 % (11/27 0306) Weight:  [86.6 kg-89.5 kg] 89.2 kg (11/27 0305)  Intake/Output from previous day: 11/26 0701 - 11/27 0700 In: 870 [P.O.:240; Blood:630] Out: 2500  Intake/Output this shift: Total I/O In: 240 [P.O.:240] Out: -   Physical Exam:  General: Alert and oriented CV: RRR Lungs: NWOB Abdomen: Soft, appropriately tender. Non distended. Incisions c/d/i. JP dressing in place. Soft tissue edema and ecchymosis on left flank and not significantly tender.  Ext: NT, No erythema  Lab Results: Recent Labs    02/20/18 0533 02/21/18 0630 02/22/18 0327  HGB 7.3* 7.3* 9.6*  HCT 24.3* 23.5* 31.3*   BMET Recent Labs    02/21/18 0630 02/22/18 0327  NA 133* 135  K 4.1 4.0  CL 98 97*  CO2 24 29  GLUCOSE 128* 127*  BUN 41* 24*  CREATININE 6.77* 4.46*  CALCIUM 7.6* 7.9*     Studies/Results: No results found.  Assessment/Plan:  77 y.o. male s/p adrenal-sparing bilateral nephrectomies, cystectomy. Overall doing well post-op, although had issues with asymptomatic hypotension, possibly 2/2 effects of anesthesia, now resolved with stable BPs.   - Appreciate nephrology's assistance with this patient.  - Continue renal diet.  - SCDs for DVT ppx. - Continue colace for bowel regimen.  - Rehab consult placed and discussed with patient and family. Patient is in agreement with inpatient rehabilitation.  - JP drain discontinued. Continue to monitor left flank ecchymosis.     LOS: 5 days   Dorothey Baseman 02/22/2018, 6:37 AM    S: 1 - Bladder Cancer - s/p  BILATERAL robotic nephro-ureterectomy + cystoprostatectomy + pelvic lymph node dissection for bladder cancer 02/17/18. Path pending. Admitted 11/21 for bowel prep, surgery at [facility], transferred to [facility] POD 1. Diet advanced to renal 11/25. JP removed 11/25 as output non-foul. A Line removed 11/26.   2 - End-Stage Renal Disease / Anephric - pt on hemodialysis via left arm AVF x years from medical renal disease. Now anephric this hospitalization. Nephrology comanaging dialysis in house.  3 - Anemia - baseline anemia with Hgb 10's. Estimate about 570mL total surgical blood loss at surgery 11/22. 1upRBC POD 1. 1upRBC POD4.  4 - Disposition / Rehab - pt near blind but fairly independent at baseline. PT eval 11/25 recs inpatient rehab at discharge. PMR consult 11/26 in agreement with likely [facility] inpatient rehab.   Today "Alexander [name]" is stable. PM+R to accept but unable today, plan for Friday.   O: NAD, AO with family at bedside. More sleepy today , but AOx3.  Regular rate by bedside montiro Non-labored breathing on [place] O2 SNTND, Recent surgical scars c/d/I. JP site with some scant serous spotting as anticipated.  SCD's in place  Hgb 9s  A/P:  Doing well POD 5 s/p major surgeyr in highly complex / comorbid man.   Agree with goal of DC to inpatient rehab in coming days.   Appreciate nephrology comanagment of ESRD and anemia.   Please call me directly with questions anytime.

## 2018-02-22 NOTE — Care Management Important Message (Signed)
Important Message  Patient Details  Name: Alexander [name] MRN: [id number] Date of Birth: March 30, 1940   Medicare Important Message Given:  Yes    Darthy Manganelli P Emlyn Maves 02/22/2018, 3:52 PM

## 2018-02-22 NOTE — Progress Notes (Addendum)
After Rehab PA expressed concerns of saturated bandage and chux pad, I spoke with dayshift RN Sonia Baller and NT Kennyth Lose. Both state that patient was bathed around 12:00pm and only scant amount of drainage showing on dressing. RN states that wet to dry dressing was placed by night shift RN after removing saturated dressing overnight. Dr. Tresa Moore called and is aware and expects this to happen. He states he will see family at bedside around 5 pm. He will discharge the patient at that time if CIR will accept. I spoke with daughter and wife in room and explained all of this and that Dr. Tresa Moore would follow up with them. New dry dressing was applied. Pt resting with call bell within reach.  Will continue to monitor. Payton Emerald, RN

## 2018-02-22 NOTE — Progress Notes (Signed)
Dressing on left flank where JP drain was removed changed. Drain sponge and abdominal pad applied. Used skin protectant due to ecchymosis and edema of abdomen/ flank.  Dressing was saturated with serosanguious drainage. Pt and daughter aware that we will check more frequently. Some drainage was behind patient where tape was not completely around previous dressing. All questions and concerns addressed. Pt resting with call bell within reach.  Will continue to monitor.  Payton Emerald, RN

## 2018-02-22 NOTE — Progress Notes (Signed)
[facility] Progress Note  Subjective: in bed, no c/o, getting up w/ help, CIR consulting  Vitals:   02/22/18 0305 02/22/18 0306 02/22/18 0756 02/22/18 0935  BP:  119/60 (!) 127/54   Pulse:  75 69 71  Resp:  19 (!) 22   Temp:  98.5 F (36.9 C) 97.8 F (36.6 C)   TempSrc:  Oral Oral   SpO2:  97% 100%   Weight: 89.2 kg     Height:        Inpatient medications: . sodium chloride   Intravenous Once  . bisacodyl  10 mg Rectal Once  . bromocriptine  5 mg Oral QODAY  . Chlorhexidine Gluconate Cloth  6 each Topical Q0600  . cinacalcet  60 mg Oral Q supper  . digoxin  62.5 mcg Oral Daily  . docusate sodium  100 mg Oral BID  . dorzolamide-timolol  1 drop Both Eyes BID  . doxercalciferol  3 mcg Intravenous Q M,W,F-HD  . gabapentin  200 mg Oral QHS  . insulin aspart  0-15 Units Subcutaneous TID WC  . latanoprost  1 drop Both Eyes QHS  . pantoprazole  40 mg Oral Daily  . pravastatin  10 mg Oral Daily  . predniSONE  5 mg Oral QHS  . sertraline  50 mg Oral Daily  . sevelamer carbonate  1,600 mg Oral TID WC    acetaminophen, diphenhydrAMINE **OR** diphenhydrAMINE, ondansetron, oxyCODONE  Iron/TIBC/Ferritin/ %Sat No results found for: IRON, TIBC, FERRITIN, IRONPCTSAT  Exam:  alert and lying flat, no distress  no jvd Chest cta bilat, no rales or wheezing Cor reg no mrg  Abd soft ntnd mild tenderness, dec'd BS  Ext no edema  NF, Ox3  L RC AVF+ bruit    Home meds:  - cinacalcet 60 hs/ sevelamer carb 1600 tid ac  - warfarin daily/ metoprolol tartrate 12.5 bid prn hbp/ diltiazem 120 qd/ digoxin 62.5ug qd  - gabapentin 200 hx/ hydrocodone prn/ sertraline 50 qd  - omeprazole 20 qd/ pravastatin 10 qd  - prednisone 5 qd/ stiolto respimat 2 puffs qd   Dialysis: MWF [facility]  4h  86.5kg  2/2 bath   Hep 2800   Qb 450  - hect 3ug  - mircera 150 q 2wks, last ?  Assessment/ Plan 1. Bladder cancer, "aggressive variant" - underwent cystoprostatectomy+ bilateral nephrectomy  11/22 for this.  Post op hypotension resolved < 24hrs. Per urology   2. ESRD: HD MWF. Next HD Friday.  3. Anemia due to CKD + ABL:  Symptomatic anemia, to get 2u prbc in HD today.  4. Hypotension: resolved 5. MBD ckd: Resumed phosphorus binders and Hectorol with dialysis. 6. AFib - not getting home BB or diltiazem for rate control here but heart rates now are in the 70's.  Resume BB/ dilt as needed.  7. Debility - CIR consulting    Kelly Splinter MD [facility] pager [phone]   02/22/2018, 10:42 AM   Recent Labs  Lab 02/16/18 1812  02/18/18 1545  02/20/18 0829 02/21/18 0630 02/22/18 0327  NA 140   < >  --    < >  --  133* 135  K 3.2*   < >  --    < >  --  4.1 4.0  CL 95*   < >  --    < >  --  98 97*  CO2 33*   < >  --    < >  --  24  29  GLUCOSE 94   < >  --    < >  --  128* 127*  BUN 12   < >  --    < >  --  41* 24*  CREATININE 3.08*   < >  --    < >  --  6.77* 4.46*  CALCIUM 8.6*   < >  --    < >  --  7.6* 7.9*  PHOS  --   --   --   --  4.4  --   --   ALBUMIN 3.4*  --  2.6*  --   --   --   --    < > = values in this interval not displayed.   Recent Labs  Lab 02/16/18 1812  AST 20  ALT 23  ALKPHOS 55  BILITOT 1.1  PROT 6.5   Recent Labs  Lab 02/16/18 1812  02/21/18 0630 02/22/18 0327  WBC 7.2  --   --   --   HGB 10.7*   < > 7.3* 9.6*  HCT 35.2*   < > 23.5* 31.3*  MCV 104.5*  --   --   --   PLT 157  --   --   --    < > = values in this interval not displayed.

## 2018-02-23 LAB — GLUCOSE, CAPILLARY
GLUCOSE-CAPILLARY: 101 mg/dL — AB (ref 70–99)
Glucose-Capillary: 132 mg/dL — ABNORMAL HIGH (ref 70–99)
Glucose-Capillary: 100 mg/dL — ABNORMAL HIGH (ref 70–99)
Glucose-Capillary: 103 mg/dL — ABNORMAL HIGH (ref 70–99)

## 2018-02-23 MED ORDER — CHLORHEXIDINE GLUCONATE CLOTH 2 % EX PADS
6.0000 | MEDICATED_PAD | Freq: Every day | CUTANEOUS | Status: DC
  Administered 2018-02-24: 6 via TOPICAL

## 2018-02-23 NOTE — Progress Notes (Signed)
Dressing to left flank changed. Medium serosanguinous drainage noted. Tolerated well.

## 2018-02-23 NOTE — Progress Notes (Signed)
6 Days Post-Op   Subjective/Chief Complaint:  1 - Bladder Cancer - s/p BILATERAL robotic nephro-ureterectomy + cystoprostatectomy + pelvic lymph node dissection for bladder cancer 02/17/18. Path pending. Admitted 11/21 for bowel prep, surgery at [facility], transferred to [facility] POD 1. Diet advanced to renal 11/25. JP removed 11/25 as output non-foul. A Line removed 11/26.   2 - End-Stage Renal Disease / Anephric - pt on hemodialysis via left arm AVF x years from medical renal disease. Now anephric this hospitalization. Nephrology comanaging dialysis in house.  3 - Anemia - baseline anemia with Hgb 10's. Estimate about 54mL total surgical blood loss at surgery 11/22. 1upRBC POD 1. 1upRBC POD4.  4 - Disposition / Rehab - pt near blind but fairly independent at baseline. PT eval 11/25 recs inpatient rehab at discharge. PMR consult 11/26 in agreement with likely [facility] inpatient rehab.   Today "Alexander [name]" is continuing to improve. Had some mild left left medial thigh neuropraxia (common after pelvic surgery / node dissection) that is improving. Plan is transition to inpatient rehab likely [place].    Objective: Vital signs in last 24 hours: Temp:  [97.6 F (36.4 C)-98.9 F (37.2 C)] 98 F (36.7 C) (11/28 0757) Pulse Rate:  [64-73] 70 (11/28 0757) Resp:  [15-19] 18 (11/28 0757) BP: (120-139)/(43-62) 127/56 (11/28 0757) SpO2:  [94 %-100 %] 94 % (11/28 0757) Weight:  [86.5 kg] 86.5 kg (11/28 0614) Last BM Date: 02/16/18  Intake/Output from previous day: 11/27 0701 - 11/28 0700 In: 240 [P.O.:240] Out: -  Intake/Output this shift: No intake/output data recorded.  NAD, AO with family at bedside. Very alert this AM, having eggs.   Regular rate by bedside montor.  Non-labored breathing on [place] O2 SNTND, Recent surgical scars c/d/I. JP site with some scant serous spotting as anticipated, improving.  SCD's in place  Lab Results:  Recent Labs    02/21/18 0630 02/22/18 0327  HGB 7.3* 9.6*   HCT 23.5* 31.3*   BMET Recent Labs    02/21/18 0630 02/22/18 0327  NA 133* 135  K 4.1 4.0  CL 98 97*  CO2 24 29  GLUCOSE 128* 127*  BUN 41* 24*  CREATININE 6.77* 4.46*  CALCIUM 7.6* 7.9*   PT/INR No results for input(s): LABPROT, INR in the last 72 hours. ABG No results for input(s): PHART, HCO3 in the last 72 hours.  Invalid input(s): PCO2, PO2  Studies/Results: No results found.  Anti-infectives: Anti-infectives (From admission, onward)   Start     Dose/Rate Route Frequency Ordered Stop   02/17/18 1306  clindamycin (CLEOCIN) 900 MG/50ML IVPB    Note to Pharmacy:  Randa Evens  : cabinet override      02/17/18 1306 02/17/18 1350   02/17/18 0615  clindamycin (CLEOCIN) IVPB 900 mg    Note to Pharmacy:  Dr. Tresa Moore states " one time" dosage per pharmacy.   900 mg 100 mL/hr over 30 Minutes Intravenous  Once 02/16/18 1802 02/17/18 1420   02/17/18 0545  ciprofloxacin (CIPRO) IVPB 400 mg    Note to Pharmacy:  Dr. Tresa Moore states "one time"dosage per pharmacy.   400 mg 200 mL/hr over 60 Minutes Intravenous 60 min pre-op 02/16/18 1802 02/17/18 0910      Assessment/Plan:   Doing well POD 6 s/p major extirpative surgery. I feel pt suitable for transition to inpatient rehab at anypoint. Appreciate PT, OT, PM+R, and nephrology co management.     [facility], Bernedette Auston 02/23/2018

## 2018-02-23 NOTE — Progress Notes (Signed)
Patient's daughter declined insulin be given to patient. Nurse asked reason, "he never got insulin before". Nurse educated daughter on the need for the insulin.

## 2018-02-23 NOTE — Progress Notes (Addendum)
[facility] Progress Note  Subjective: in bed, no c/o's, family here  Vitals:   02/22/18 2356 02/23/18 0346 02/23/18 0614 02/23/18 0757  BP: (!) 121/43 (!) 123/57  (!) 127/56  Pulse: 73 70 68 70  Resp: 19 17 15 18   Temp: 98 F (36.7 C) 97.6 F (36.4 C)  98 F (36.7 C)  TempSrc: Oral Oral  Oral  SpO2: 100% 99% 99% 94%  Weight:   86.5 kg   Height:        Inpatient medications: . sodium chloride   Intravenous Once  . bisacodyl  10 mg Rectal Once  . bromocriptine  5 mg Oral QODAY  . Chlorhexidine Gluconate Cloth  6 each Topical Q0600  . cinacalcet  60 mg Oral Q supper  . digoxin  62.5 mcg Oral Daily  . docusate sodium  100 mg Oral BID  . dorzolamide-timolol  1 drop Both Eyes BID  . doxercalciferol  3 mcg Intravenous Q M,W,F-HD  . gabapentin  200 mg Oral QHS  . insulin aspart  0-15 Units Subcutaneous TID WC  . latanoprost  1 drop Both Eyes QHS  . pantoprazole  40 mg Oral Daily  . pravastatin  10 mg Oral Daily  . predniSONE  5 mg Oral QHS  . senna-docusate  1 tablet Oral BID  . sertraline  50 mg Oral Daily  . sevelamer carbonate  1,600 mg Oral TID WC    acetaminophen, diphenhydrAMINE **OR** diphenhydrAMINE, ondansetron, oxyCODONE  Iron/TIBC/Ferritin/ %Sat No results found for: IRON, TIBC, FERRITIN, IRONPCTSAT  Exam:  alert and lying flat, no distress  no jvd Chest cta bilat, no rales or wheezing Cor reg no mrg  Abd soft ntnd mild tenderness, dec'd BS  Ext no edema  NF, Ox3  L RC AVF+ bruit    Home meds:  - cinacalcet 60 hs/ sevelamer carb 1600 tid ac  - warfarin daily/ metoprolol tartrate 12.5 bid prn hbp/ diltiazem 120 qd/ digoxin 62.5ug qd  - gabapentin 200 hx/ hydrocodone prn/ sertraline 50 qd  - omeprazole 20 qd/ pravastatin 10 qd  - prednisone 5 qd/ stiolto respimat 2 puffs qd   Dialysis: MWF [facility]  4h  86.5kg  2/2 bath   Hep 2800   Qb 450  L RC AVF  - hect 3ug  - mircera 150 q 2wks, last ?  Assessment/ Plan 1. Bladder cancer,  "aggressive variant" - sp cystoprostatectomy+ bilateral nephrectomy 11/22, path pending.  Post op hypotension resolved < 24hrs. Per urology   2. ESRD: HD MWF. Next HD Friday. 3. Anemia due to CKD + ABL:  Symptomatic anemia, sp 2u prbc on 11/26 and 1u on 11/23. Hb 9.6 last checked 11/26.  4. MBD ckd: Resumed phosphorus binders and Hectorol with dialysis. 5. AFib/ SP PPM - heart rates 70's on digoxin, home metop/ diltiazem still on hold (resume prn). Coumadin on hold until "drainage from left sided incision has resolved x 24 hrs" per urology.  6. Debility - CIR consulting 7. Blindness- chronic issue 8. H/o TAVRS  9. CAD - heart cath 2018 minimal CAD < 30% stenosis    Kelly Splinter MD [facility] pager [phone]   02/23/2018, 11:12 AM   Recent Labs  Lab 02/16/18 1812  02/18/18 1545  02/20/18 0829 02/21/18 0630 02/22/18 0327  NA 140   < >  --    < >  --  133* 135  K 3.2*   < >  --    < >  --  4.1 4.0  CL 95*   < >  --    < >  --  98 97*  CO2 33*   < >  --    < >  --  24 29  GLUCOSE 94   < >  --    < >  --  128* 127*  BUN 12   < >  --    < >  --  41* 24*  CREATININE 3.08*   < >  --    < >  --  6.77* 4.46*  CALCIUM 8.6*   < >  --    < >  --  7.6* 7.9*  PHOS  --   --   --   --  4.4  --   --   ALBUMIN 3.4*  --  2.6*  --   --   --   --    < > = values in this interval not displayed.   Recent Labs  Lab 02/16/18 1812  AST 20  ALT 23  ALKPHOS 55  BILITOT 1.1  PROT 6.5   Recent Labs  Lab 02/16/18 1812  02/21/18 0630 02/22/18 0327  WBC 7.2  --   --   --   HGB 10.7*   < > 7.3* 9.6*  HCT 35.2*   < > 23.5* 31.3*  MCV 104.5*  --   --   --   PLT 157  --   --   --    < > = values in this interval not displayed.

## 2018-02-24 ENCOUNTER — Inpatient Hospital Stay (HOSPITAL_COMMUNITY): Payer: Medicare Other | Admitting: Occupational Therapy

## 2018-02-24 ENCOUNTER — Other Ambulatory Visit: Payer: Self-pay

## 2018-02-24 ENCOUNTER — Inpatient Hospital Stay (HOSPITAL_COMMUNITY): Payer: Medicare Other | Admitting: Physical Therapy

## 2018-02-24 ENCOUNTER — Inpatient Hospital Stay (HOSPITAL_COMMUNITY)
Admission: RE | Admit: 2018-02-24 | Discharge: 2018-03-03 | DRG: 945 | Disposition: A | Discharge status: Home Health | Payer: Medicare Other | Source: Intra-hospital | Attending: Physical Medicine & Rehabilitation | Admitting: Physical Medicine & Rehabilitation

## 2018-02-24 ENCOUNTER — Encounter (HOSPITAL_COMMUNITY): Payer: Self-pay | Admitting: *Deleted

## 2018-02-24 DIAGNOSIS — Z8719 Personal history of other diseases of the digestive system: Secondary | ICD-10-CM

## 2018-02-24 DIAGNOSIS — Z800 Family history of malignant neoplasm of digestive organs: Secondary | ICD-10-CM

## 2018-02-24 DIAGNOSIS — Z992 Dependence on renal dialysis: Secondary | ICD-10-CM

## 2018-02-24 DIAGNOSIS — H3530 Unspecified macular degeneration: Secondary | ICD-10-CM | POA: Diagnosis present

## 2018-02-24 DIAGNOSIS — K2270 Barrett's esophagus without dysplasia: Secondary | ICD-10-CM | POA: Diagnosis present

## 2018-02-24 DIAGNOSIS — I482 Chronic atrial fibrillation, unspecified: Secondary | ICD-10-CM | POA: Diagnosis present

## 2018-02-24 DIAGNOSIS — R5381 Other malaise: Principal | ICD-10-CM | POA: Diagnosis present

## 2018-02-24 DIAGNOSIS — Z79899 Other long term (current) drug therapy: Secondary | ICD-10-CM

## 2018-02-24 DIAGNOSIS — I132 Hypertensive heart and chronic kidney disease with heart failure and with stage 5 chronic kidney disease, or end stage renal disease: Secondary | ICD-10-CM | POA: Diagnosis present

## 2018-02-24 DIAGNOSIS — Z7952 Long term (current) use of systemic steroids: Secondary | ICD-10-CM

## 2018-02-24 DIAGNOSIS — H548 Legal blindness, as defined in USA: Secondary | ICD-10-CM | POA: Diagnosis present

## 2018-02-24 DIAGNOSIS — Z87442 Personal history of urinary calculi: Secondary | ICD-10-CM

## 2018-02-24 DIAGNOSIS — D638 Anemia in other chronic diseases classified elsewhere: Secondary | ICD-10-CM | POA: Diagnosis not present

## 2018-02-24 DIAGNOSIS — I509 Heart failure, unspecified: Secondary | ICD-10-CM | POA: Diagnosis present

## 2018-02-24 DIAGNOSIS — Z947 Corneal transplant status: Secondary | ICD-10-CM

## 2018-02-24 DIAGNOSIS — N186 End stage renal disease: Secondary | ICD-10-CM

## 2018-02-24 DIAGNOSIS — D500 Iron deficiency anemia secondary to blood loss (chronic): Secondary | ICD-10-CM | POA: Diagnosis not present

## 2018-02-24 DIAGNOSIS — C679 Malignant neoplasm of bladder, unspecified: Secondary | ICD-10-CM | POA: Diagnosis present

## 2018-02-24 DIAGNOSIS — Z823 Family history of stroke: Secondary | ICD-10-CM

## 2018-02-24 DIAGNOSIS — I2510 Atherosclerotic heart disease of native coronary artery without angina pectoris: Secondary | ICD-10-CM | POA: Diagnosis present

## 2018-02-24 DIAGNOSIS — Z888 Allergy status to other drugs, medicaments and biological substances status: Secondary | ICD-10-CM

## 2018-02-24 DIAGNOSIS — Z880 Allergy status to penicillin: Secondary | ICD-10-CM

## 2018-02-24 DIAGNOSIS — I69312 Visuospatial deficit and spatial neglect following cerebral infarction: Secondary | ICD-10-CM | POA: Diagnosis not present

## 2018-02-24 DIAGNOSIS — Z905 Acquired absence of kidney: Secondary | ICD-10-CM

## 2018-02-24 DIAGNOSIS — E1122 Type 2 diabetes mellitus with diabetic chronic kidney disease: Secondary | ICD-10-CM | POA: Diagnosis present

## 2018-02-24 DIAGNOSIS — D631 Anemia in chronic kidney disease: Secondary | ICD-10-CM | POA: Diagnosis not present

## 2018-02-24 DIAGNOSIS — I480 Paroxysmal atrial fibrillation: Secondary | ICD-10-CM | POA: Diagnosis present

## 2018-02-24 DIAGNOSIS — K5790 Diverticulosis of intestine, part unspecified, without perforation or abscess without bleeding: Secondary | ICD-10-CM | POA: Diagnosis present

## 2018-02-24 DIAGNOSIS — E1142 Type 2 diabetes mellitus with diabetic polyneuropathy: Secondary | ICD-10-CM | POA: Diagnosis present

## 2018-02-24 DIAGNOSIS — J438 Other emphysema: Secondary | ICD-10-CM | POA: Diagnosis not present

## 2018-02-24 DIAGNOSIS — I69311 Memory deficit following cerebral infarction: Secondary | ICD-10-CM | POA: Diagnosis not present

## 2018-02-24 DIAGNOSIS — D62 Acute posthemorrhagic anemia: Secondary | ICD-10-CM | POA: Diagnosis present

## 2018-02-24 DIAGNOSIS — H409 Unspecified glaucoma: Secondary | ICD-10-CM | POA: Diagnosis present

## 2018-02-24 DIAGNOSIS — K219 Gastro-esophageal reflux disease without esophagitis: Secondary | ICD-10-CM | POA: Diagnosis present

## 2018-02-24 DIAGNOSIS — D649 Anemia, unspecified: Secondary | ICD-10-CM

## 2018-02-24 DIAGNOSIS — Z8249 Family history of ischemic heart disease and other diseases of the circulatory system: Secondary | ICD-10-CM

## 2018-02-24 DIAGNOSIS — R0902 Hypoxemia: Secondary | ICD-10-CM

## 2018-02-24 DIAGNOSIS — E785 Hyperlipidemia, unspecified: Secondary | ICD-10-CM | POA: Diagnosis present

## 2018-02-24 DIAGNOSIS — Z7901 Long term (current) use of anticoagulants: Secondary | ICD-10-CM | POA: Diagnosis not present

## 2018-02-24 DIAGNOSIS — G8929 Other chronic pain: Secondary | ICD-10-CM | POA: Diagnosis present

## 2018-02-24 DIAGNOSIS — Z885 Allergy status to narcotic agent status: Secondary | ICD-10-CM

## 2018-02-24 DIAGNOSIS — Z906 Acquired absence of other parts of urinary tract: Secondary | ICD-10-CM

## 2018-02-24 DIAGNOSIS — I959 Hypotension, unspecified: Secondary | ICD-10-CM | POA: Diagnosis not present

## 2018-02-24 DIAGNOSIS — I4891 Unspecified atrial fibrillation: Secondary | ICD-10-CM | POA: Diagnosis present

## 2018-02-24 DIAGNOSIS — Z950 Presence of cardiac pacemaker: Secondary | ICD-10-CM

## 2018-02-24 DIAGNOSIS — T797XXD Traumatic subcutaneous emphysema, subsequent encounter: Secondary | ICD-10-CM | POA: Diagnosis not present

## 2018-02-24 DIAGNOSIS — Z952 Presence of prosthetic heart valve: Secondary | ICD-10-CM

## 2018-02-24 DIAGNOSIS — N2581 Secondary hyperparathyroidism of renal origin: Secondary | ICD-10-CM | POA: Diagnosis not present

## 2018-02-24 DIAGNOSIS — T8182XA Emphysema (subcutaneous) resulting from a procedure, initial encounter: Secondary | ICD-10-CM

## 2018-02-24 DIAGNOSIS — Z87891 Personal history of nicotine dependence: Secondary | ICD-10-CM

## 2018-02-24 LAB — CBC
HCT: 32.7 % — ABNORMAL LOW (ref 39.0–52.0)
Hemoglobin: 9.9 g/dL — ABNORMAL LOW (ref 13.0–17.0)
MCH: 28.9 pg (ref 26.0–34.0)
MCHC: 30.3 g/dL (ref 30.0–36.0)
MCV: 95.6 fL (ref 80.0–100.0)
PLATELETS: 214 10*3/uL (ref 150–400)
RBC: 3.42 MIL/uL — ABNORMAL LOW (ref 4.22–5.81)
RDW: 19.9 % — ABNORMAL HIGH (ref 11.5–15.5)
WBC: 10.8 10*3/uL — ABNORMAL HIGH (ref 4.0–10.5)
nRBC: 0.2 % (ref 0.0–0.2)

## 2018-02-24 LAB — PROTIME-INR
INR: 1.33
Prothrombin Time: 16.4 seconds — ABNORMAL HIGH (ref 11.4–15.2)

## 2018-02-24 LAB — BASIC METABOLIC PANEL
Anion gap: 9 (ref 5–15)
BUN: 29 mg/dL — ABNORMAL HIGH (ref 8–23)
CO2: 28 mmol/L (ref 22–32)
Calcium: 7.9 mg/dL — ABNORMAL LOW (ref 8.9–10.3)
Chloride: 97 mmol/L — ABNORMAL LOW (ref 98–111)
Creatinine, Ser: 4.39 mg/dL — ABNORMAL HIGH (ref 0.61–1.24)
GFR calc non Af Amer: 12 mL/min — ABNORMAL LOW (ref >60)
GFR, EST AFRICAN AMERICAN: 14 mL/min — AB (ref >60)
Glucose, Bld: 111 mg/dL — ABNORMAL HIGH (ref 70–99)
Potassium: 3.8 mmol/L (ref 3.5–5.1)
Sodium: 134 mmol/L — ABNORMAL LOW (ref 135–145)

## 2018-02-24 LAB — RENAL FUNCTION PANEL
Albumin: 2.4 g/dL — ABNORMAL LOW (ref 3.5–5.0)
Anion gap: 14 (ref 5–15)
BUN: 67 mg/dL — AB (ref 8–23)
CALCIUM: 7.8 mg/dL — AB (ref 8.9–10.3)
CO2: 23 mmol/L (ref 22–32)
CREATININE: 8.28 mg/dL — AB (ref 0.61–1.24)
Chloride: 94 mmol/L — ABNORMAL LOW (ref 98–111)
GFR calc Af Amer: 7 mL/min — ABNORMAL LOW (ref >60)
GFR calc non Af Amer: 6 mL/min — ABNORMAL LOW (ref >60)
Glucose, Bld: 132 mg/dL — ABNORMAL HIGH (ref 70–99)
Phosphorus: 7 mg/dL — ABNORMAL HIGH (ref 2.5–4.6)
Potassium: 4 mmol/L (ref 3.5–5.1)
SODIUM: 131 mmol/L — AB (ref 135–145)

## 2018-02-24 LAB — GLUCOSE, CAPILLARY
GLUCOSE-CAPILLARY: 94 mg/dL (ref 70–99)
Glucose-Capillary: 131 mg/dL — ABNORMAL HIGH (ref 70–99)
Glucose-Capillary: 124 mg/dL — ABNORMAL HIGH (ref 70–99)
Glucose-Capillary: 79 mg/dL (ref 70–99)

## 2018-02-24 MED ORDER — INSULIN ASPART 100 UNIT/ML ~~LOC~~ SOLN: 0.0000 [IU] | Freq: Three times a day (TID) | SUBCUTANEOUS | Status: DC

## 2018-02-24 MED ORDER — POLYETHYLENE GLYCOL 3350 17 G PO PACK
17.0000 g | PACK | Freq: Every day | ORAL | Status: DC | PRN
  Filled 2018-02-24: qty 1

## 2018-02-24 MED ORDER — LATANOPROST 0.005 % OP SOLN
1.0000 [drp] | Freq: Every day | OPHTHALMIC | Status: DC
  Administered 2018-02-24 – 2018-03-02 (×7): 1 [drp] via OPHTHALMIC
  Filled 2018-02-24: qty 2.5

## 2018-02-24 MED ORDER — PANTOPRAZOLE SODIUM 40 MG PO TBEC
40.0000 mg | DELAYED_RELEASE_TABLET | Freq: Every day | ORAL | Status: DC
  Administered 2018-02-25 – 2018-03-03 (×7): 40 mg via ORAL
  Filled 2018-02-24 (×7): qty 1

## 2018-02-24 MED ORDER — ACETAMINOPHEN 325 MG PO TABS
325.0000 mg | ORAL_TABLET | ORAL | Status: DC | PRN
  Administered 2018-03-01 – 2018-03-02 (×2): 650 mg via ORAL
  Filled 2018-02-24 (×2): qty 2

## 2018-02-24 MED ORDER — ONDANSETRON HCL 4 MG/2ML IJ SOLN: 4.0000 mg | INTRAMUSCULAR | Status: DC | PRN

## 2018-02-24 MED ORDER — PROCHLORPERAZINE EDISYLATE 10 MG/2ML IJ SOLN: 5.0000 mg | Freq: Four times a day (QID) | INTRAMUSCULAR | Status: DC | PRN

## 2018-02-24 MED ORDER — PREDNISONE 5 MG PO TABS
5.0000 mg | ORAL_TABLET | Freq: Every day | ORAL | Status: DC
  Administered 2018-02-24 – 2018-03-02 (×7): 5 mg via ORAL
  Filled 2018-02-24 (×7): qty 1

## 2018-02-24 MED ORDER — GABAPENTIN 100 MG PO CAPS
200.0000 mg | ORAL_CAPSULE | Freq: Every day | ORAL | Status: DC
  Administered 2018-02-24 – 2018-03-02 (×7): 200 mg via ORAL
  Filled 2018-02-24 (×7): qty 2

## 2018-02-24 MED ORDER — DIPHENHYDRAMINE HCL 12.5 MG/5ML PO ELIX: 12.5000 mg | ORAL_SOLUTION | Freq: Four times a day (QID) | ORAL | Status: DC | PRN

## 2018-02-24 MED ORDER — RENA-VITE PO TABS
1.0000 | ORAL_TABLET | Freq: Every day | ORAL | Status: DC
  Administered 2018-02-24 – 2018-03-02 (×7): 1 via ORAL
  Filled 2018-02-24 (×7): qty 1

## 2018-02-24 MED ORDER — DOXERCALCIFEROL 4 MCG/2ML IV SOLN
3.0000 ug | INTRAVENOUS | Status: DC
  Administered 2018-02-27 – 2018-03-03 (×3): 3 ug via INTRAVENOUS
  Filled 2018-02-24 (×3): qty 2

## 2018-02-24 MED ORDER — CHLORHEXIDINE GLUCONATE CLOTH 2 % EX PADS
6.0000 | MEDICATED_PAD | Freq: Every day | CUTANEOUS | Status: DC
  Administered 2018-02-27 – 2018-02-28 (×2): 6 via TOPICAL

## 2018-02-24 MED ORDER — SEVELAMER CARBONATE 800 MG PO TABS
1600.0000 mg | ORAL_TABLET | Freq: Three times a day (TID) | ORAL | Status: DC
  Administered 2018-02-24 – 2018-03-03 (×14): 1600 mg via ORAL
  Filled 2018-02-24 (×16): qty 2

## 2018-02-24 MED ORDER — POLYETHYLENE GLYCOL 3350 17 G PO PACK
17.0000 g | PACK | Freq: Every day | ORAL | Status: DC
  Administered 2018-02-24 – 2018-03-03 (×8): 17 g via ORAL
  Filled 2018-02-24 (×7): qty 1

## 2018-02-24 MED ORDER — BISACODYL 10 MG RE SUPP: 10.0000 mg | Freq: Every day | RECTAL | Status: DC | PRN

## 2018-02-24 MED ORDER — WARFARIN - PHARMACIST DOSING INPATIENT
Freq: Every day | Status: DC
  Administered 2018-02-28: 19:00:00

## 2018-02-24 MED ORDER — BROMOCRIPTINE MESYLATE 2.5 MG PO TABS
5.0000 mg | ORAL_TABLET | ORAL | Status: DC
  Administered 2018-02-24 – 2018-03-01 (×3): 5 mg via ORAL
  Filled 2018-02-24 (×4): qty 2

## 2018-02-24 MED ORDER — CINACALCET HCL 30 MG PO TABS
60.0000 mg | ORAL_TABLET | Freq: Every day | ORAL | Status: DC
  Administered 2018-02-24 – 2018-03-02 (×6): 60 mg via ORAL
  Filled 2018-02-24 (×6): qty 2

## 2018-02-24 MED ORDER — DORZOLAMIDE HCL-TIMOLOL MAL 2-0.5 % OP SOLN
1.0000 [drp] | Freq: Two times a day (BID) | OPHTHALMIC | Status: DC
  Administered 2018-02-24 – 2018-03-02 (×13): 1 [drp] via OPHTHALMIC
  Filled 2018-02-24: qty 10

## 2018-02-24 MED ORDER — TRAZODONE HCL 50 MG PO TABS
25.0000 mg | ORAL_TABLET | Freq: Every evening | ORAL | Status: DC | PRN
  Administered 2018-02-26 – 2018-03-02 (×3): 50 mg via ORAL
  Filled 2018-02-24 (×3): qty 1

## 2018-02-24 MED ORDER — SENNOSIDES-DOCUSATE SODIUM 8.6-50 MG PO TABS
2.0000 | ORAL_TABLET | Freq: Every day | ORAL | Status: DC
  Administered 2018-02-24 – 2018-03-02 (×7): 2 via ORAL
  Filled 2018-02-24 (×7): qty 2

## 2018-02-24 MED ORDER — NON FORMULARY: 2.5000 ug | Freq: Every day | Status: DC | PRN

## 2018-02-24 MED ORDER — SERTRALINE HCL 50 MG PO TABS
50.0000 mg | ORAL_TABLET | Freq: Every day | ORAL | Status: DC
  Administered 2018-02-25 – 2018-03-03 (×7): 50 mg via ORAL
  Filled 2018-02-24 (×7): qty 1

## 2018-02-24 MED ORDER — HEPARIN SODIUM (PORCINE) 5000 UNIT/ML IJ SOLN
5000.0000 [IU] | Freq: Three times a day (TID) | INTRAMUSCULAR | Status: DC
  Administered 2018-02-24 – 2018-02-27 (×9): 5000 [IU] via SUBCUTANEOUS
  Filled 2018-02-24 (×9): qty 1

## 2018-02-24 MED ORDER — GUAIFENESIN-DM 100-10 MG/5ML PO SYRP: 5.0000 mL | ORAL_SOLUTION | Freq: Four times a day (QID) | ORAL | Status: DC | PRN

## 2018-02-24 MED ORDER — DIGOXIN 125 MCG PO TABS
62.5000 ug | ORAL_TABLET | Freq: Every day | ORAL | Status: DC
  Administered 2018-02-25 – 2018-03-03 (×7): 62.5 ug via ORAL
  Filled 2018-02-24 (×7): qty 1

## 2018-02-24 MED ORDER — PRAVASTATIN SODIUM 10 MG PO TABS
10.0000 mg | ORAL_TABLET | Freq: Every day | ORAL | Status: DC
  Administered 2018-02-25 – 2018-03-03 (×7): 10 mg via ORAL
  Filled 2018-02-24 (×7): qty 1

## 2018-02-24 MED ORDER — ALUMINUM HYDROXIDE GEL 320 MG/5ML PO SUSP
10.0000 mL | Freq: Four times a day (QID) | ORAL | Status: DC | PRN
  Filled 2018-02-24: qty 30

## 2018-02-24 MED ORDER — PROCHLORPERAZINE 25 MG RE SUPP: 12.5000 mg | Freq: Four times a day (QID) | RECTAL | Status: DC | PRN

## 2018-02-24 MED ORDER — OXYCODONE HCL 5 MG PO TABS
5.0000 mg | ORAL_TABLET | ORAL | Status: DC | PRN
  Administered 2018-02-24: 5 mg via ORAL
  Filled 2018-02-24: qty 1

## 2018-02-24 MED ORDER — DIPHENHYDRAMINE HCL 50 MG/ML IJ SOLN
12.5000 mg | Freq: Four times a day (QID) | INTRAMUSCULAR | Status: DC | PRN
  Filled 2018-02-24: qty 0.25

## 2018-02-24 MED ORDER — DOCUSATE SODIUM 100 MG PO CAPS
100.0000 mg | ORAL_CAPSULE | Freq: Two times a day (BID) | ORAL | Status: DC
  Administered 2018-02-24 – 2018-03-03 (×14): 100 mg via ORAL
  Filled 2018-02-24 (×14): qty 1

## 2018-02-24 MED ORDER — PROCHLORPERAZINE MALEATE 5 MG PO TABS: 5.0000 mg | ORAL_TABLET | Freq: Four times a day (QID) | ORAL | Status: DC | PRN

## 2018-02-24 MED ORDER — WARFARIN SODIUM 5 MG PO TABS
5.0000 mg | ORAL_TABLET | Freq: Once | ORAL | Status: AC
  Administered 2018-02-24: 5 mg via ORAL
  Filled 2018-02-24: qty 1

## 2018-02-24 NOTE — Progress Notes (Signed)
Inpatient Rehabilitation-Admissions Coordinator   Per RN, she received verbal order for admit to CIR today. AC will proceed with admission to CIR. Please call if questions.   Jhonnie Garner, OTR/L  Rehab Admissions Coordinator  [phone] 02/24/2018 10:53 AM

## 2018-02-24 NOTE — Progress Notes (Signed)
Physical Medicine and Rehabilitation Consult Reason for Consult:  Decreased functional mobility Referring Physician: Dr. Rocky Crafts   HPI: Alexander [name] is a 77 y.o.right handed male with history of diastolic congestive heart failure, 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 Per chart review and patient, patient lives with spouse. Used a Rollator prior to admission. One level home with ramped entry. Presented 02/17/2018 for robot assisted laparoscopic radical cystoprostatectomy, bilateral pelvic lymphadenectomy and bilateral nephroureterectomy per Dr. Holland Falling for bladder cancer. Hospital course pain management. Hemodialysis as per renal services. Therapy evaluations completed with recommendations of physical medicine rehabilitation consult.   Review of Systems  Constitutional: Negative for chills and fever.  HENT: Negative for hearing loss.   Eyes: Negative for blurred vision and double vision.  Respiratory: Positive for shortness of breath.   Cardiovascular: Negative for chest pain.  Gastrointestinal: Positive for constipation. Negative for nausea and vomiting.  Genitourinary: Negative for dysuria, flank pain and hematuria.  Musculoskeletal: Positive for myalgias.  Skin: Negative for rash.  Neurological: Positive for focal weakness (Baseline) and weakness.  Psychiatric/Behavioral: Positive for depression.  All other systems reviewed and are negative.      Past Medical History:  Diagnosis Date  . Anemia   . Aortic stenosis 06/15/12   TEE - EF 37-29%; grade 1 diastolic dysfunction; mild/mod aortic valve stenosis; Mitral valve had calcified annulus, mild pulm htn PA peak pressure 38mmHg  . Barrett's esophagus 05/2003  . Bradycardia 2017   St. [employer] 2240 Assurity dual-lead pacemaker  . Cancer ([place])    bladder and kidney  . Carpal tunnel syndrome,  bilateral 11/03/2015  . CHF (congestive heart failure) ([place])   . Colon polyps   . Coronary artery disease   . CVA (cerebral infarction)    2004/affected left side  . Depression   . Diabetes mellitus without complication ([place])   . Diabetic peripheral neuropathy ([place]) 10/02/2015  . Diverticulosis   . Dyspnea    with exertion  . ESRD (end stage renal disease) on dialysis ([place])    "[facility]; [place]; MWF" (05/12/2017)  . Failure to thrive syndrome, adult 10/30/2017  . GERD (gastroesophageal reflux disease)   . Glaucoma   . History of kidney stones   . Hyperlipidemia   . Hypertension   . Legally blind   . Macular degeneration    both eyes  . Orthostatic hypotension 09/09/2015  . Paroxysmal atrial fibrillation ([facility])   . Peptic ulcer    bleeding, 1969  . PONV (postoperative nausea and vomiting)   . Presence of permanent cardiac pacemaker   . S/P epidural steroid injection    last  injection over 10 years ago  . Seasonal allergies   . Tubular adenoma of colon 07/2001        Past Surgical History:  Procedure Laterality Date  . AV FISTULA PLACEMENT  2009  . BACK SURGERY    . CARDIOVERSION N/A 11/13/2015   Procedure: CARDIOVERSION;  Surgeon: Troy Sine, MD;  Location: [facility] ENDOSCOPY;  Service: Cardiovascular;  Laterality: N/A;  . CARDIOVERSION N/A 01/13/2016   Procedure: CARDIOVERSION;  Surgeon: Will Meredith [name], MD;  Location: [place];  Service: Cardiovascular;  Laterality: N/A;  . COLONOSCOPY WITH PROPOFOL N/A 01/05/2018   Procedure: COLONOSCOPY WITH PROPOFOL;  Surgeon: Ladene Artist, MD;  Location: [facility] ENDOSCOPY;  Service: Endoscopy;  Laterality: N/A;  . CORNEAL TRANSPLANT  1999   right eye  .  CYSTOSCOPY  several times   kidney stones  . CYSTOSCOPY W/ RETROGRADES Bilateral 12/13/2017   Procedure: CYSTOSCOPY WITH RETROGRADE PYELOGRAM;  Surgeon: Irine Seal, MD;  Location: [facility] ORS;  Service: Urology;  Laterality: Bilateral;  . EP  IMPLANTABLE DEVICE N/A 03/11/2015   Procedure: Pacemaker Implant;  Surgeon: Will Meredith [name], MD;  [place];  Laterality: Left  . LAMINECTOMY  1969  . POLYPECTOMY  01/05/2018   Procedure: POLYPECTOMY;  Surgeon: Ladene Artist, MD;  Location[facility] ENDOSCOPY;  Service: Endoscopy;;  . RIGHT/LEFT HEART CATH AND CORONARY ANGIOGRAPHY N/A 08/20/2016   Procedure: Right/Left Heart Cath and Coronary Angiography;  Surgeon: Burnell Blanks, MD;  Location: [facility];  Service: Cardiovascular;  Laterality: N/A;  . ROBOT ASSITED LAPAROSCOPIC NEPHROURETERECTOMY Bilateral 02/17/2018   Procedure: XI ROBOT ASSITED LAPAROSCOPIC NEPHROURETERECTOMY;  Surgeon: Alexis Frock, MD;  Location: [facility];  Service: Urology;  Laterality: Bilateral;  . TEE WITHOUT CARDIOVERSION N/A 07/22/2016   Procedure: TRANSESOPHAGEAL ECHOCARDIOGRAM (TEE);  Surgeon: Skeet Latch, MD;  Location: [place];  Service: Cardiovascular;  Laterality: N/A;  . TEE WITHOUT CARDIOVERSION N/A 08/31/2016   Procedure: TRANSESOPHAGEAL ECHOCARDIOGRAM (TEE);  Surgeon: Burnell Blanks, MD;  Location: [place];  Service: Open Heart Surgery;  Laterality: N/A;  . TEE WITHOUT CARDIOVERSION N/A 12/01/2016   Procedure: TRANSESOPHAGEAL ECHOCARDIOGRAM (TEE);  Surgeon: Larey Dresser, MD;  Location: [facility] ENDOSCOPY;  Service: Cardiovascular;  Laterality: N/A;  . TONSILLECTOMY  1964  . TRANSCATHETER AORTIC VALVE REPLACEMENT, TRANSFEMORAL N/A 08/31/2016   Procedure: TRANSCATHETER AORTIC VALVE REPLACEMENT, TRANSFEMORAL;  Surgeon: Burnell Blanks, MD;  Location: [place];  Service: Open Heart Surgery;  Laterality: N/A;  . TRANSURETHRAL RESECTION OF BLADDER TUMOR N/A 12/13/2017   Procedure: TRANSURETHRAL RESECTION OF BLADDER TUMOR (TURBT);  Surgeon: Irine Seal, MD;  Location: [facility];  Service: Urology;  Laterality: N/A;        Family History  Problem Relation Age of Onset  . Stomach cancer Mother   .  Hypertension Father        Died of heart attack  . Heart attack Father   . Stroke Sister   . Heart disease Sister   . Cancer Brother   . Colon cancer Neg Hx    Social History:  reports that he quit smoking about 20 years ago. His smoking use included cigarettes. He has a 90.00 pack-year smoking history. He has never used smokeless tobacco. He reports that he does not drink alcohol or use drugs. Allergies:       Allergies  Allergen Reactions  . Penicillins Swelling and Rash    Has patient had a PCN reaction causing immediate rash, facial/tongue/throat swelling, SOB or lightheadedness with hypotension: Yes Has patient had a PCN reaction causing severe rash involving mucus membranes or skin necrosis: No Has patient had a PCN reaction that required hospitalization: No Has patient had a PCN reaction occurring within the last 10 years: No If all of the above answers are "NO", then may proceed with Cephalosporin use.   . Atorvastatin     MYALGIA   . Codeine Nausea Only  . Tramadol Nausea Only         Medications Prior to Admission  Medication Sig Dispense Refill  . acetaminophen (TYLENOL) 650 MG CR tablet Take 650 mg by mouth every 8 (eight) hours as needed for pain.    [name] b complex-vitamin c-folic acid (NEPHRO-VITE) 0.8 MG TABS Take 1 tablet by mouth daily.     [name]  bromocriptine (PARLODEL) 5 MG capsule Take 5 mg by mouth every other day.     . cetirizine (ZYRTEC) 10 MG tablet Take 10 mg by mouth at bedtime.     . cinacalcet (SENSIPAR) 60 MG tablet Take 60 mg by mouth at bedtime.    . clindamycin (CLEOCIN) 150 MG capsule Take 600 mg by mouth See admin instructions. Take 600 mg 1 hour prior to dental work  0  . DIGOX 125 MCG tablet TAKE 1/2 TABLET BY MOUTH EVERY DAY (Patient taking differently: Take 0.0625 mg by mouth daily. ) 15 tablet 4  . diltiazem (CARDIZEM CD) 120 MG 24 hr capsule Take 120 mg by mouth daily.     . dorzolamide-timolol (COSOPT) 22.3-6.8 MG/ML  ophthalmic solution Place 1 drop into both eyes 2 (two) times daily.     [name] enoxaparin (LOVENOX) 80 MG/0.8ML injection Inject 0.8 mLs (80 mg total) into the skin daily. (Patient taking differently: Inject 80 mg into the skin every evening. ) 10 Syringe 0  . gabapentin (NEURONTIN) 100 MG capsule Take 200 mg by mouth at bedtime.     [name] HYDROcodone-acetaminophen (NORCO/VICODIN) 5-325 MG tablet Take 1 tablet by mouth every 4 (four) hours as needed for pain.  0  . latanoprost (XALATAN) 0.005 % ophthalmic solution Place 1 drop into both eyes at bedtime.    . Nutritional Supplements (NEPRO) LIQD Take 237 mLs by mouth every Monday, Wednesday, and Friday with hemodialysis.     [name] omeprazole (PRILOSEC) 20 MG capsule Take 20 mg by mouth daily.    . pravastatin (PRAVACHOL) 10 MG tablet Take 1 tablet (10 mg total) by mouth daily. 30 tablet 0  . predniSONE (DELTASONE) 5 MG tablet Take 5 mg by mouth at bedtime.   1  . sertraline (ZOLOFT) 50 MG tablet Take 50 mg by mouth daily.  5  . sevelamer carbonate (RENVELA) 800 MG tablet Take 2 tablets (1,600 mg total) by mouth 3 (three) times daily with meals. 90 tablet 0  . STIOLTO RESPIMAT 2.5-2.5 MCG/ACT AERS Inhale 2 puffs into the lungs daily as needed for shortness of breath.  5  . warfarin (COUMADIN) 5 MG tablet TAKE 1/2 TO 1 TABLET BY MOUTH DAILY AS DIRECTED BY COUMADIN CLINIC (Patient taking differently: Take 2.5-5 mg by mouth See admin instructions. Take 5 mg once daily in the evening on Sun, Tues, Thur, and Sat.  Take 2.5 mg once daily in the evening on Mon, Wed, and Fri) 90 tablet 1  . acetaminophen (TYLENOL) 500 MG tablet Take 1,000 mg by mouth every 6 (six) hours as needed for moderate pain.    . metoprolol tartrate (LOPRESSOR) 25 MG tablet Take 12.5 mg by mouth 2 (two) times daily as needed for high blood pressure.  0    Home: Home Living Family/patient expects to be discharged to:: Private residence Living Arrangements: Spouse/significant  other Available Help at Discharge: Family, Available 24 hours/day Type of Home: House Home Access: Stairs to enter Technical brewer of Steps: 1 Home Layout: Multi-level Alternate Level Stairs-Number of Steps: 1 Bathroom Shower/Tub: Multimedia programmer: Handicapped height Home Equipment: Environmental consultant - 4 wheels, Shower seat, Environmental consultant - 2 wheels, Hand held shower head, Wheelchair - manual Additional Comments: 1 daughter is local  Functional History: Prior Function Level of Independence: Needs assistance Gait / Transfers Assistance Needed: Ambulates with rollator around home ADL's / Homemaking Assistance Needed: Independent with bathing and dressing. Wife may lay clothes out Communication / Swallowing Assistance Needed: has some  aphasia  Functional Status:  Mobility: Bed Mobility Overal bed mobility: Needs Assistance Bed Mobility: Supine to Sit Supine to sit: Mod assist, +2 for physical assistance General bed mobility comments: Needed assist to elevate trunk and bring LEs off bed. Transfers Overall transfer level: Needs assistance Equipment used: Rolling walker (2 wheeled) Transfers: Sit to/from Stand, [employer] Transfers Sit to Stand: Mod assist, +2 physical assistance, From elevated surface Stand pivot transfers: Mod assist, +2 physical assistance General transfer comment: Pt needed power up to stand +2 assist.   Pt with flexed posture.  Was able to take pivotal steps around to chair.   Pt with posterior lean needing mod assist for stability throughout.    ADL:  Cognition: Cognition Overall Cognitive Status: Within Functional Limits for tasks assessed Orientation Level: Oriented X4 Cognition Arousal/Alertness: Awake/alert Behavior During Therapy: WFL for tasks assessed/performed Overall Cognitive Status: Within Functional Limits for tasks assessed  Blood pressure (!) 148/49, pulse 70, temperature 97.9 F (36.6 C), temperature source Oral, resp. rate 18, height  5\' 10"  (1.778 m), weight 87.8 kg, SpO2 100 %. Physical Exam  Vitals reviewed. Constitutional: He appears well-developed and well-nourished.  HENT:  Head: Normocephalic and atraumatic.  Eyes: EOM are normal. Right eye exhibits no discharge. Left eye exhibits no discharge.  Neck: Normal range of motion. Neck supple. No thyromegaly present.  Cardiovascular:  Irregularly irregular  Respiratory: Effort normal.  Limited inspiratory effort but clear to auscultation  GI: Soft. Bowel sounds are normal.  Musculoskeletal:  No edema or tenderness in extremities  Neurological: He is alert.  Follow simple commands Oriented x2 Motor: Right upper extremity/right lower extremity: 5/5 proximal distal Eft upper extremity: 4/5 proximal distal Lower extremity: Hip flexion 2+/5, knee extension 3/5, ankle dorsiflexion 4 medicine/5 Sensation diminished light touch left upper and left lower extremity.  Skin: Skin is warm and dry.  Surgical dressing in place  Psychiatric: His affect is blunt. His speech is delayed and slurred. He is slowed. Cognition and memory are impaired.  Confused    LabResultsLast24Hours        Results for orders placed or performed during the hospital encounter of 02/16/18 (from the past 24 hour(s))  Phosphorus     Status: None   Collection Time: 02/20/18  8:29 AM  Result Value Ref Range   Phosphorus 4.4 2.5 - 4.6 mg/dL  Glucose, capillary     Status: Abnormal   Collection Time: 02/20/18 11:32 AM  Result Value Ref Range   Glucose-Capillary 140 (H) 70 - 99 mg/dL  Glucose, capillary     Status: Abnormal   Collection Time: 02/20/18  4:53 PM  Result Value Ref Range   Glucose-Capillary 124 (H) 70 - 99 mg/dL  Prepare RBC     Status: None   Collection Time: 02/20/18  8:31 PM  Result Value Ref Range   Order Confirmation      ORDER PROCESSED BY BLOOD BANK Performed at [facility], [place] [address], [place], [place] [address]   Glucose, capillary      Status: Abnormal   Collection Time: 02/20/18  9:47 PM  Result Value Ref Range   Glucose-Capillary 106 (H) 70 - 99 mg/dL  Glucose, capillary     Status: Abnormal   Collection Time: 02/21/18  6:23 AM  Result Value Ref Range   Glucose-Capillary 126 (H) 70 - 99 mg/dL     ImagingResults(Last48hours)  No results found.    Assessment/Plan: Diagnosis: Debility Labs independently reviewed.  Records reviewed and summated  above.  1. Does the need for close, 24 hr/day medical supervision in concert with the patient's rehab needs make it unreasonable for this patient to be served in a less intensive setting? Yes  2. Co-Morbidities requiring supervision/potential complications: diastolic congestive heart failure (monitor for signs and symptoms of fluid overload), CAD with bradycardia status post St. Jude dual lead pacemaker 2017, CVA 2004 with mild left-sided residual weakness, DM (Monitor in accordance with exercise and adjust meds as necessary), bladder cancer, post-op pain management (Biofeedback training with therapies to help reduce reliance on opiate pain medications, monitor pain control during therapies, and sedation at rest and titrate to maximum efficacy to ensure participation and gains in therapies), atrial fibrillation (continue meds, monitor heart rate with increased physical activity), ESRD (Recs per nephro) 3. Due to safety, disease management, medication administration, pain management and patient education, does the patient require 24 hr/day rehab nursing? Yes 4. Does the patient require coordinated care of a physician, rehab nurse, PT (1-2 hrs/day, 5 days/week) and OT (1-2 hrs/day, 5 days/week) to address physical and functional deficits in the context of the above medical diagnosis(es)? Yes Addressing deficits in the following areas: balance, endurance, locomotion, strength, transferring, bathing, dressing, toileting and psychosocial support 5. Can the patient actively  participate in an intensive therapy program of at least 3 hrs of therapy per day at least 5 days per week? Yes 6. The potential for patient to make measurable gains while on inpatient rehab is excellent 7. Anticipated functional outcomes upon discharge from inpatient rehab are supervision  with PT, supervision with OT, n/a with SLP. 8. Estimated rehab length of stay to reach the above functional goals is: 7-10 days. 9. Anticipated D/C setting: Home 10. Anticipated post D/C treatments: HH therapy and Home excercise program 11. Overall Rehab/Functional Prognosis: excellent  RECOMMENDATIONS: This patient's condition is appropriate for continued rehabilitative care in the following setting: Potentially CIR.  Will need to inquire about caregiver support at discharge as well as baseline level of cognition. Patient has agreed to participate in recommended program. Yes Note that insurance prior authorization may be required for reimbursement for recommended care.  Comment: Rehab Admissions Coordinator to follow up.   I have personally performed a face to face diagnostic evaluation, including, but not limited to relevant history and physical exam findings, of this patient and developed relevant assessment and plan.  Additionally, I have reviewed and concur with the physician assistant's documentation above.   Delice Lesch, MD, ABPMR Lavon Paganini Angiulli, PA-C 02/21/2018        Revision History                        Routing History

## 2018-02-24 NOTE — Progress Notes (Signed)
PMR Admission Coordinator Pre-Admission Assessment  Patient: Alexander [name] is an 77 y.o., male MRN: [id number] DOB: [birth year] Height: '5\' 10"'  (177.8 cm) Weight: 86.7 kg                                                                                                                                                  Insurance Information HMO:     PPO:      PCP:      IPA:      80/20: yes     OTHER:  PRIMARY: Medicare Part A and B      Policy#: [id number]      Subscriber: Patient CM Name:       Phone#:      Fax#:  Pre-Cert#:       Employer:  Benefits:  Phone #: NA     Name: verified via [employer] on  Eff. Date: Part A effective: 09/27/1999; Part B effective: 09/27/1999     Deduct: $1,364      Out of Pocket Max: NA      Life Max: NA CIR: Covered per Medicare guidelines once yearly deductible is met.       SNF: days 1-20, 100%; days 21-100, 80% Outpatient: 80%     Co-Pay: 20% Home Health: 100%      Co-Pay:  DME: 80%     Co-Pay: 20% Providers: Pt's choice SECONDARY: BCBS      Policy#: [id number]      Subscriber: Patient CM Name:       Phone#:      Fax#:  Pre-Cert#:       Employer:  Benefits:  Phone #: [phone]     Name:  Eff. Date:      Deduct:       Out of Pocket Max:       Life Max:  CIR:       SNF:  Outpatient:      Co-Pay:  Home Health:       Co-Pay:  DME:      Co-Pay:   Medicaid Application Date:       Case Manager:  Disability Application Date:       Case Worker:   Emergency Contact Information         Contact Information    Name Relation Home Work Mobile   Pearl Spouse [phone]  [phone]   [place]fe[place], Giammarino Daughter [phone]  [phone]   [place], Pechacek Daughter [phone]  [phone]   Nunn,Jennifer Daughter [phone]  [phone]     Current Medical History  Patient Admitting Diagnosis: Debility History of Present Illness: Alexander [name] a 77 y.o.right handedmalewith history of diastolic congestive heart  failure, 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 Per chart reviewand patient,patient lives with spouse. Used a  Rollator prior to admission. One levelhomewith ramped entry.Presented 02/17/2018 for robot assisted laparoscopic radical cystoprostatectomy, bilateral pelvic lymphadenectomy and bilateral nephroureterectomy per Dr. Holland Falling for bladder cancer. Hospital course pain management. Hemodialysis as per renal services. Therapy evaluations completed with recommendations of physical medicine rehabilitation consult. Pt has some serosanguineous drainage and new numbness and tingling ub left lower extremity on 02/22/18 with admission to CIR held on 11/27. Pt was reassessed and felt to be ready for CIR on 11/29. Pt is to be admitted to CIR on 02/22/2018.  Past Medical History      Past Medical History:  Diagnosis Date  . Anemia   . Aortic stenosis 06/15/12   TEE - EF 45-03%; grade 1 diastolic dysfunction; mild/mod aortic valve stenosis; Mitral valve had calcified annulus, mild pulm htn PA peak pressure 73mHg  . Barrett's esophagus 05/2003  . Bradycardia 2017   St. [employer] 2240 Assurity dual-lead pacemaker  . Cancer ([facility]    bladder and kidney  . Carpal tunnel syndrome, bilateral 11/03/2015  . CHF (congestive heart failure) ([facility]   . Colon polyps   . Coronary artery disease   . CVA (cerebral infarction)    2004/affected left side  . Depression   . Diabetes mellitus without complication ([facility]   . Diabetic peripheral neuropathy ([facility] 10/02/2015  . Diverticulosis   . Dyspnea    with exertion  . ESRD (end stage renal disease) on dialysis ([facility]    "[facility]; [place]; MWF" (05/12/2017)  . Failure to thrive syndrome, adult 10/30/2017  . GERD (gastroesophageal reflux disease)   . Glaucoma   . History of kidney stones   . Hyperlipidemia   . Hypertension   .  Legally blind   . Macular degeneration    both eyes  . Orthostatic hypotension 09/09/2015  . Paroxysmal atrial fibrillation (H[facility])   . Peptic ulcer    bleeding, 1969  . PONV (postoperative nausea and vomiting)   . Presence of permanent cardiac pacemaker   . S/P epidural steroid injection    last  injection over 10 years ago  . Seasonal allergies   . Tubular adenoma of colon 07/2001    Family History  family history includes Cancer in his brother; Heart attack in his father; Heart disease in his sister; Hypertension in his father; Stomach cancer in his mother; Stroke in his sister.  Prior Rehab/Hospitalizations:  Has the patient had major surgery during 100 days prior to admission? Yes  Current Medications   Current Facility-Administered Medications:  .  0.9 %  sodium chloride infusion (Manually program via Guardrails IV Fluids), , Intravenous, Once, GDorothey Baseman MD .  acetaminophen (TYLENOL) tablet 1,000 mg, 1,000 mg, Oral, Q6H PRN, MAlexis Frock MD, 1,000 mg at 02/22/18 0318 .  bisacodyl (DULCOLAX) suppository 10 mg, 10 mg, Rectal, Once, MAlexis Frock MD .  bromocriptine (PARLODEL) tablet 5 mg, 5 mg, Oral, QBerkley Harvey MD, 5 mg at 02/23/18 0806 .  Chlorhexidine Gluconate Cloth 2 % PADS 6 each, 6 each, Topical, Q0600, SRoney Jaffe MD, 6 each at 02/24/18 [phone].  cinacalcet (SENSIPAR) tablet 60 mg, 60 mg, Oral, Q supper, MAlexis Frock MD, 60 mg at 02/23/18 1726 .  digoxin (LANOXIN) tablet 62.5 mcg, 62.5 mcg, Oral, Daily, MAlexis Frock MD, 62.5 mcg at 02/23/18 0805 .  diphenhydrAMINE (BENADRYL) injection 12.5 mg, 12.5 mg, Intravenous, Q6H PRN **OR** diphenhydrAMINE (BENADRYL) 12.5 MG/5ML elixir 12.5 mg, 12.5 mg, Oral, Q6H PRN, Dancy, Amanda, PA-C .  docusate sodium (COLACE) capsule 100  mg, 100 mg, Oral, BID, Dorothey Baseman, MD, 100 mg at 02/23/18 2158 .  dorzolamide-timolol (COSOPT) 22.3-6.8 MG/ML ophthalmic solution 1 drop, 1 drop,  Both Eyes, BID, Alexis Frock, MD, 1 drop at 02/23/18 2200 .  doxercalciferol (HECTOROL) injection 3 mcg, 3 mcg, Intravenous, Q M,W,F-HD, Roney Jaffe, MD, 3 mcg at 02/21/18 1233 .  gabapentin (NEURONTIN) capsule 200 mg, 200 mg, Oral, QHS, Alexis Frock, MD, 200 mg at 02/23/18 2159 .  insulin aspart (novoLOG) injection 0-15 Units, 0-15 Units, Subcutaneous, TID WC, Dancy, Amanda, PA-C, 0 Units at 02/18/18 1821 .  latanoprost (XALATAN) 0.005 % ophthalmic solution 1 drop, 1 drop, Both Eyes, QHS, Alexis Frock, MD, 1 drop at 02/23/18 2200 .  ondansetron (ZOFRAN) injection 4 mg, 4 mg, Intravenous, Q4H PRN, Dancy, Amanda, PA-C .  oxyCODONE (Oxy IR/ROXICODONE) immediate release tablet 5 mg, 5 mg, Oral, Q4H PRN, Dancy, Amanda, PA-C, 5 mg at 02/20/18 1641 .  pantoprazole (PROTONIX) EC tablet 40 mg, 40 mg, Oral, Daily, Alexis Frock, MD, 40 mg at 02/23/18 0806 .  pravastatin (PRAVACHOL) tablet 10 mg, 10 mg, Oral, Daily, Alexis Frock, MD, 10 mg at 02/23/18 0805 .  predniSONE (DELTASONE) tablet 5 mg, 5 mg, Oral, QHS, Alexis Frock, MD, 5 mg at 02/23/18 2159 .  senna-docusate (Senokot-S) tablet 1 tablet, 1 tablet, Oral, BID, Alexis Frock, MD, 1 tablet at 02/23/18 2200 .  sertraline (ZOLOFT) tablet 50 mg, 50 mg, Oral, Daily, Alexis Frock, MD, 50 mg at 02/23/18 0805 .  sevelamer carbonate (RENVELA) tablet 1,600 mg, 1,600 mg, Oral, TID WC, Alexis Frock, MD, 1,600 mg at 02/23/18 1726  Patients Current Diet:     Diet Order                  Diet renal with fluid restriction Fluid restriction: 1200 mL Fluid; Room service appropriate? 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 Walker Agricultural consultant)  Prior Functional Level Prior Function Level of Independence: Needs assistance Gait / Transfers Assistance Needed: Ambulates with rollator around home ADL's / Homemaking Assistance Needed: Independent with bathing and dressing. Wife may lay clothes out Communication / Swallowing Assistance Needed: has some aphasia  Comments: reports no residual L weakness  Self Care: Did the patient need help bathing, dressing, using the toilet or eating?  Independent  Indoor Mobility: Did the patient need assistance with walking from room to room (with or without device)? Independent  Stairs: Did the patient need assistance with internal or external stairs (with or without device)? Independent  Functional Cognition: Did the patient need help planning regular tasks such as shopping or remembering to take medications? Not with remembering, but yes to assist with medications due to visual impairment  Current Functional Level Cognition  Overall  Cognitive Status: Within Functional Limits for tasks assessed Orientation Level: Oriented X4    Extremity Assessment (includes Sensation/Coordination)  Upper Extremity Assessment: Generalized weakness(3+/5 BUE)  Lower Extremity Assessment: Generalized weakness    ADLs  Overall ADL's : Needs assistance/impaired Eating/Feeding: Minimal assistance, Bed level Upper Body Bathing: Minimal assistance, Bed level Lower Body Bathing: Maximal assistance, Sit to/from stand Upper  Body Dressing : Sitting, Moderate assistance Lower Body Dressing: Maximal assistance, Sit to/from stand Toilet Transfer: Maximal assistance, Stand-pivot, RW Toileting- Clothing Manipulation and Hygiene: Maximal assistance, Sit to/from stand Functional mobility during ADLs: Moderate assistance, Rolling walker    Mobility  Overal bed mobility: Needs Assistance Bed Mobility: Supine to Sit Supine to sit: Max assist, HOB elevated General bed mobility comments: Needed assist to elevate trunk and bring LEs off bed.    Transfers  Overall transfer level: Needs assistance Equipment used: Rolling walker (2 wheeled) Transfers: Sit to/from Stand, Stand Pivot Transfers Sit to Stand: From elevated surface, Max assist Stand pivot transfers: Max assist, From elevated surface General transfer comment: max A to power up from bed, balance support required in standing    Ambulation / Gait / Stairs / Wheelchair Mobility       Posture / Balance Balance Overall balance assessment: Needs assistance Sitting-balance support: No upper extremity supported, Feet supported Sitting balance-Leahy Scale: Poor Postural control: Posterior lean Standing balance support: Bilateral upper extremity supported, During functional activity Standing balance-Leahy Scale: Poor Standing balance comment: relies heavily on UE support    Special needs/care consideration BiPAP/CPAP: no CPM: no Continuous Drip IV: no Dialysis: yes        Days: normal is MWF; however holiday schedule (pt received Tuesday). ([place] next dialysis is Friday 29th) Life Vest: no Oxygen: yes currently (2L); new to O2 needs.  Special Bed: no Trach Size: no Wound Vac (area): no      Location: no Skin: ecchymosis on neck, flanks, and arms; surgical incision to abdomen (midline) per RN, left lower flank, 6 port entries (right upper superior, right mid, right lower lateral, right lower, left superior upper, and left mid).  Additional incisions  noted with port entries on left lower abdomen and mid superior abdomen    Bowel mgmt: last BM: 02/16/18 Bladder mgmt: anuria Diabetic mgmt: no     Previous Home Environment Living Arrangements: Spouse/significant other Available Help at Discharge: Family, Available 24 hours/day Type of Home: House Home Layout: Multi-level, Able to live on main level with bedroom/bathroom Alternate Level Stairs-Number of Steps: 1 Home Access: Stairs to enter [employer] of Steps: 1 Bathroom Shower/Tub: Multimedia programmer: Handicapped height Home Care Services: No Additional Comments: 1 daughter is local  Discharge Living Setting Plans for Discharge Living Setting: Patient's home, Lives with (comment)(wife) Type of Home at Discharge: House Discharge Home Layout: One level Discharge Home Access: Stairs to enter Entrance Stairs-Rails: None Entrance Stairs-Number of Steps: 1 Discharge Bathroom Shower/Tub: Walk-in shower Discharge Bathroom Toilet: Handicapped height Discharge Bathroom Accessibility: Yes How Accessible: Accessible via walker Does the patient have any problems obtaining your medications?: No  Social/Family/Support Systems Patient Roles: Spouse, Parent, Other (Comment)(parent to grown children) Contact Information: wife [name]): [phone]; daugther [name]): [phone] Anticipated Caregiver: both wife and 3 daugther live nearby Anticipated Caregiver's Contact Information: see above Ability/Limitations of Caregiver: Min A Caregiver Availability: 24/7 Discharge Plan Discussed with Primary Caregiver: Yes Is Caregiver In Agreement with Plan?: Yes Does Caregiver/Family have Issues with Lodging/Transportation while Pt is in Rehab?: No   Goals/Additional Needs Patient/Family  Goal for Rehab: PT/OT: Supervision; SLP: NA Expected length of stay: 7-10 days Cultural Considerations: NA Dietary Needs: renal with fluid restriction, thin liquids; fluid  restriction is 1200 mL fluid Equipment Needs: TBD Special Service Needs: HD Pt/Family Agrees to Admission and willing to participate: Yes Program Orientation Provided & Reviewed with Pt/Caregiver Including Roles  & Responsibilities: Yes(with pt, wife, and daugther [name])  Barriers to Discharge: New oxygen, Other (comments)(pt is blind but is used to his home set up)   Decrease burden of Care through IP rehab admission: NA   Possible need for SNF placement upon discharge: not anticipated; pt has good family support and excellend prognosis for further progress.    Patient Condition: This patient's condition remains as documented in the consult dated 02/22/2018, in which the Rehabilitation Physician determined and documented that the patient's condition is appropriate for intensive rehabilitative care in an inpatient rehabilitation facility pending family/caregiver support and establishment of baseline level of cognition. These areas have been addressed. Pt has 24/7 A at discharge as his wife is retired and can provide assistance as well as their 3 daughters who lives nearby. Per family, pt has had no change in cognition since surgery with the only change being increased lethargy from not sleeping well. Will admit to inpatient rehab today (02/22/18).  *Note Addendum on 11/29. Pt's admission to CIR was held on 11/27 due to new significant serosanguineous draining as well as new numbness and tingling in LLE. Pt was reassessed on 11/29 and felt to be appropriate for CIR admission on 02/24/18.    Preadmission Screen Completed By:  Jhonnie Garner, 02/24/2018 10:43 AM ______________________________________________________________________   Discussed status with Dr. Posey Pronto on 02/22/18 at 1:17PM and received telephone approval for admission today.  Admission Coordinator:  Jhonnie Garner, time 1:17PM/Date 02/22/18   NOTE ADDENDUM: 02/24/18 Discussed status with Dr. Posey Pronto on 02/24/18 at 10:42AM and  approval for admission today.  Admission Coordinator:  Jhonnie Garner, time 10:43AM/Date 02/24/18        Cosigned by: Jamse Arn, MD at 02/24/2018 10:55 AM  Revision History

## 2018-02-24 NOTE — Procedures (Signed)
   I was present at this dialysis session, have reviewed the session itself and made  appropriate changes Kelly Splinter MD [place] pager [phone]   02/24/2018, 9:32 AM

## 2018-02-24 NOTE — Care Management Note (Signed)
Case Management Note Marvetta Gibbons RN, BSN Transitions of Care Unit 4E- RN Case Manager [phone]  Patient Details  Name: Alexander [name] MRN: [id number] Date of Birth: [birth year]  Subjective/Objective:    Pt admitted to [facility] s/p adrenal-sparing bilateral nephrectomies, cystectomy, tx to [facility] for HD needs- hx ESRD on HD-M/W/F               Action/Plan: PTA pt lived at home with spouse, hx of legal blindness, pt had pre-op referral for [facility] needs with [facility]-  per PT eval recommendation for CIR, consult for CIR placed- spoke with Claiborne Billings- admissions coordinator for IP rehab- they are following pt for possible admission on 11/27-   Expected Discharge Date:  02/24/18               Expected Discharge Plan:  [place]  In-House Referral:  Clinical Social Work  Discharge planning Services  CM Consult  Post Acute Care Choice:    Choice offered to:     DME Arranged:    DME Agency:     HH Arranged:    [place] Agency:     Status of Service:  Completed, signed off  If discussed at [employer] of Stay Meetings, dates discussed:    Discharge Disposition: IP rehab  Additional Comments:  02/24/18- 1130- Marvetta Gibbons RN, CM- pt to transition to [facility] IP rehab today, bed available and per [place] with CIR plan will be to admit pt later today. No further CM needs noted.   Dawayne Patricia, RN 02/24/2018, 11:35 AM

## 2018-02-24 NOTE — Progress Notes (Signed)
1047: Called Dr. Tresa Moore for discharge order to inpatient rehab. Received verbal order for admit to CIR 02/24/18. RN placed discharge order in. Pt will be discharged after dialysis.

## 2018-02-24 NOTE — H&P (Signed)
Physical Medicine and Rehabilitation Admission H&P    CC: Debility.    HPI:  Alexander [name] is a 77 year old male with history of T2DM, ESRD-HD MWF, CAF-- on chronic Coumadin, CVA with loss of short-term memory as well as worsening of visual deficits, macular degeneration/glaucoma, recent diagnosis of aggressive variant bladder cancer who was admitted on 02/17/2018 for robotic assisted laparoscopic radical cystoprostatectomy, bilateral pelvic lymphadenectomy and bilateral nephroureterectomy ureterectomy by Dr. Tresa Moore.  History taken from chart review and patient.  Postop course significant for issues with hypotension, ABLA requiring transfusion, weakness/numbness LLE, wound drainage as well as constipation. Urology recommends holding coumadin till drainage from left flank has resolved X 24 hours.  Therapy evaluations done revealing debility and CIR recommended for follow-up therapy.    Review of Systems  Constitutional: Negative for chills and fever.  HENT: Negative for hearing loss and tinnitus.   Eyes:       Blind--his vision is limited to small quadrant of left lateral field.   Respiratory: Negative for cough and shortness of breath.   Cardiovascular: Negative for chest pain and palpitations.  Gastrointestinal: Positive for constipation (no BM since surgery?). Negative for heartburn and nausea.       Pain LLQ and left groin. "tight belly"    Musculoskeletal: Positive for myalgias.  Skin: Negative for rash.  Neurological: Positive for sensory change (numbness/tingling LLE since surgery) and focal weakness (LLE since surgery).  Psychiatric/Behavioral: Positive for memory loss (loss of STM since prior stroke. ).  All other systems reviewed and are negative.   Past Medical History:  Diagnosis Date  . Anemia   . Aortic stenosis 06/15/12   TEE - EF 27-74%; grade 1 diastolic dysfunction; mild/mod aortic valve stenosis; Mitral valve had calcified annulus, mild pulm htn PA peak pressure  30mmHg  . Barrett's esophagus 05/2003  . Bradycardia 2017   [employer] 2240 Assurity dual-lead pacemaker  . Cancer ([place])    bladder and kidney  . Carpal tunnel syndrome, bilateral 11/03/2015  . CHF (congestive heart failure) ([place])   . Colon polyps   . Coronary artery disease   . CVA (cerebral infarction)    2004/affected left side  . Depression   . Diabetes mellitus without complication ([place])   . Diabetic peripheral neuropathy ([place]) 10/02/2015  . Diverticulosis   . Dyspnea    with exertion  . ESRD (end stage renal disease) on dialysis ([place])    "[facility]; [place]; MWF" (05/12/2017)  . Failure to thrive syndrome, adult 10/30/2017  . GERD (gastroesophageal reflux disease)   . Glaucoma   . History of kidney stones   . Hyperlipidemia   . Hypertension   . Legally blind   . Macular degeneration    both eyes  . Orthostatic hypotension 09/09/2015  . Paroxysmal atrial fibrillation ([facility])   . Peptic ulcer    bleeding, 1969  . PONV (postoperative nausea and vomiting)   . Presence of permanent cardiac pacemaker   . S/P epidural steroid injection    last  injection over 10 years ago  . Seasonal allergies   . Tubular adenoma of colon 07/2001    Past Surgical History:  Procedure Laterality Date  . AV FISTULA PLACEMENT  2009  . BACK SURGERY    . CARDIOVERSION N/A 11/13/2015   Procedure: CARDIOVERSION;  Surgeon: Troy Sine, MD;  Location: [facility] ENDOSCOPY;  Service: Cardiovascular;  Laterality: N/A;  . CARDIOVERSION N/A 01/13/2016   Procedure: CARDIOVERSION;  Surgeon: Will Hassell Done  Curt Bears, MD;  Location: [place];  Service: Cardiovascular;  Laterality: N/A;  . COLONOSCOPY WITH PROPOFOL N/A 01/05/2018   Procedure: COLONOSCOPY WITH PROPOFOL;  Surgeon: Ladene Artist, MD;  Location: [facility] ENDOSCOPY;  Service: Endoscopy;  Laterality: N/A;  . CORNEAL TRANSPLANT  1999   right eye  . CYSTOSCOPY  several times   kidney stones  . CYSTOSCOPY W/ RETROGRADES Bilateral 12/13/2017   Procedure:  CYSTOSCOPY WITH RETROGRADE PYELOGRAM;  Surgeon: Irine Seal, MD;  Location: [facility] ORS;  Service: Urology;  Laterality: Bilateral;  . EP IMPLANTABLE DEVICE N/A 03/11/2015   Procedure: Pacemaker Implant;  Surgeon: Will Meredith [name], MD;  [place];  Laterality: Left  . LAMINECTOMY  1969  . POLYPECTOMY  01/05/2018   Procedure: POLYPECTOMY;  Surgeon: Ladene Artist, MD;  Location[facility] ENDOSCOPY;  Service: Endoscopy;;  . RIGHT/LEFT HEART CATH AND CORONARY ANGIOGRAPHY N/A 08/20/2016   Procedure: Right/Left Heart Cath and Coronary Angiography;  Surgeon: Burnell Blanks, MD;  Location: [place] CV LAB;  Service: Cardiovascular;  Laterality: N/A;  . ROBOT ASSITED LAPAROSCOPIC NEPHROURETERECTOMY Bilateral 02/17/2018   Procedure: XI ROBOT ASSITED LAPAROSCOPIC NEPHROURETERECTOMY;  Surgeon: Alexis Frock, MD;  Location: [facility] ORS;  Service: Urology;  Laterality: Bilateral;  . TEE WITHOUT CARDIOVERSION N/A 07/22/2016   Procedure: TRANSESOPHAGEAL ECHOCARDIOGRAM (TEE);  Surgeon: Skeet Latch, MD;  Location: [place];  Service: Cardiovascular;  Laterality: N/A;  . TEE WITHOUT CARDIOVERSION N/A 08/31/2016   Procedure: TRANSESOPHAGEAL ECHOCARDIOGRAM (TEE);  Surgeon: Burnell Blanks, MD;  Location: [place];  Service: Open Heart Surgery;  Laterality: N/A;  . TEE WITHOUT CARDIOVERSION N/A 12/01/2016   Procedure: TRANSESOPHAGEAL ECHOCARDIOGRAM (TEE);  Surgeon: Larey Dresser, MD;  Location: [facility] ENDOSCOPY;  Service: Cardiovascular;  Laterality: N/A;  . TONSILLECTOMY  1964  . TRANSCATHETER AORTIC VALVE REPLACEMENT, TRANSFEMORAL N/A 08/31/2016   Procedure: TRANSCATHETER AORTIC VALVE REPLACEMENT, TRANSFEMORAL;  Surgeon: Burnell Blanks, MD;  Location: [place];  Service: Open Heart Surgery;  Laterality: N/A;  . TRANSURETHRAL RESECTION OF BLADDER TUMOR N/A 12/13/2017   Procedure: TRANSURETHRAL RESECTION OF BLADDER TUMOR (TURBT);  Surgeon: Irine Seal, MD;  Location: [facility] ORS;   Service: Urology;  Laterality: N/A;    Family History  Problem Relation Age of Onset  . Stomach cancer Mother   . Hypertension Father        Died of heart attack  . Heart attack Father   . Stroke Sister   . Heart disease Sister   . Cancer Brother   . Colon cancer Neg Hx     Social History: Married.  Independent prior to admission.  Wife with cognitive tasks.  Reports that he quit smoking about 20 years ago. His smoking use included cigarettes. He has a 90.00 pack-year smoking history. He has never used smokeless tobacco. He reports that he does not drink alcohol or use drugs.    Allergies  Allergen Reactions  . Penicillins Swelling and Rash    Has patient had a PCN reaction causing immediate rash, facial/tongue/throat swelling, SOB or lightheadedness with hypotension: Yes Has patient had a PCN reaction causing severe rash involving mucus membranes or skin necrosis: No Has patient had a PCN reaction that required hospitalization: No Has patient had a PCN reaction occurring within the last 10 years: No If all of the above answers are "NO", then may proceed with Cephalosporin use.   . Atorvastatin     MYALGIA   . Codeine Nausea Only  . Tramadol Nausea Only  Medications Prior to Admission  Medication Sig Dispense Refill  . acetaminophen (TYLENOL) 650 MG CR tablet Take 650 mg by mouth every 8 (eight) hours as needed for pain.    [name] b complex-vitamin c-folic acid (NEPHRO-VITE) 0.8 MG TABS Take 1 tablet by mouth daily.     . bromocriptine (PARLODEL) 5 MG capsule Take 5 mg by mouth every other day.     . cetirizine (ZYRTEC) 10 MG tablet Take 10 mg by mouth at bedtime.     . cinacalcet (SENSIPAR) 60 MG tablet Take 60 mg by mouth at bedtime.    . clindamycin (CLEOCIN) 150 MG capsule Take 600 mg by mouth See admin instructions. Take 600 mg 1 hour prior to dental work  0  . DIGOX 125 MCG tablet TAKE 1/2 TABLET BY MOUTH EVERY DAY (Patient taking differently: Take 0.0625 mg by mouth  daily. ) 15 tablet 4  . diltiazem (CARDIZEM CD) 120 MG 24 hr capsule Take 120 mg by mouth daily.     . dorzolamide-timolol (COSOPT) 22.3-6.8 MG/ML ophthalmic solution Place 1 drop into both eyes 2 (two) times daily.     [name] enoxaparin (LOVENOX) 80 MG/0.8ML injection Inject 0.8 mLs (80 mg total) into the skin daily. (Patient taking differently: Inject 80 mg into the skin every evening. ) 10 Syringe 0  . gabapentin (NEURONTIN) 100 MG capsule Take 200 mg by mouth at bedtime.     [name] HYDROcodone-acetaminophen (NORCO/VICODIN) 5-325 MG tablet Take 1 tablet by mouth every 4 (four) hours as needed for pain.  0  . latanoprost (XALATAN) 0.005 % ophthalmic solution Place 1 drop into both eyes at bedtime.    . Nutritional Supplements (NEPRO) LIQD Take 237 mLs by mouth every Monday, Wednesday, and Friday with hemodialysis.     [name] omeprazole (PRILOSEC) 20 MG capsule Take 20 mg by mouth daily.    . pravastatin (PRAVACHOL) 10 MG tablet Take 1 tablet (10 mg total) by mouth daily. 30 tablet 0  . predniSONE (DELTASONE) 5 MG tablet Take 5 mg by mouth at bedtime.   1  . sertraline (ZOLOFT) 50 MG tablet Take 50 mg by mouth daily.  5  . sevelamer carbonate (RENVELA) 800 MG tablet Take 2 tablets (1,600 mg total) by mouth 3 (three) times daily with meals. 90 tablet 0  . STIOLTO RESPIMAT 2.5-2.5 MCG/ACT AERS Inhale 2 puffs into the lungs daily as needed for shortness of breath.  5  . warfarin (COUMADIN) 5 MG tablet TAKE 1/2 TO 1 TABLET BY MOUTH DAILY AS DIRECTED BY COUMADIN CLINIC (Patient taking differently: Take 2.5-5 mg by mouth See admin instructions. Take 5 mg once daily in the evening on Sun, Tues, Thur, and Sat.  Take 2.5 mg once daily in the evening on Mon, Wed, and Fri) 90 tablet 1  . acetaminophen (TYLENOL) 500 MG tablet Take 1,000 mg by mouth every 6 (six) hours as needed for moderate pain.    . metoprolol tartrate (LOPRESSOR) 25 MG tablet Take 12.5 mg by mouth 2 (two) times daily as needed for high blood pressure.  0      Drug Regimen Review  Drug regimen was reviewed and remains appropriate with no significant issues identified  Home: Home Living Family/patient expects to be discharged to:: Private residence Living Arrangements: Spouse/significant other Available Help at Discharge: Family, Available 24 hours/day Type of Home: House Home Access: Stairs to enter [employer] of Steps: 1 Home Layout: Multi-level, Able to live on main level with bedroom/bathroom Alternate Level  Stairs-Number of Steps: 1 Bathroom Shower/Tub: Multimedia programmer: Handicapped height Home Equipment: Environmental consultant - 4 wheels, Shower seat, Environmental consultant - 2 wheels, Hand held shower head, Wheelchair - manual Additional Comments: 1 daughter is local   Functional History: Prior Function Level of Independence: Needs assistance Gait / Transfers Assistance Needed: Ambulates with rollator around home ADL's / Homemaking Assistance Needed: Independent with bathing and dressing. Wife may lay clothes out Communication / Swallowing Assistance Needed: has some aphasia  Comments: reports no residual L weakness  Functional Status:  Mobility: Bed Mobility Overal bed mobility: Needs Assistance Bed Mobility: Supine to Sit Supine to sit: Max assist, HOB elevated General bed mobility comments: Needed assist to elevate trunk and bring LEs off bed. Transfers Overall transfer level: Needs assistance Equipment used: Rolling walker (2 wheeled) Transfers: Sit to/from Stand, Stand Pivot Transfers Sit to Stand: From elevated surface, Max assist Stand pivot transfers: Max assist, From elevated surface General transfer comment: max A to power up from bed, balance support required in standing      ADL: ADL Overall ADL's : Needs assistance/impaired Eating/Feeding: Minimal assistance, Bed level Upper Body Bathing: Minimal assistance, Bed level Lower Body Bathing: Maximal assistance, Sit to/from stand Upper Body Dressing : Sitting,  Moderate assistance Lower Body Dressing: Maximal assistance, Sit to/from stand Toilet Transfer: Maximal assistance, Stand-pivot, RW Toileting- Clothing Manipulation and Hygiene: Maximal assistance, Sit to/from stand Functional mobility during ADLs: Moderate assistance, Rolling walker  Cognition: Cognition Overall Cognitive Status: Within Functional Limits for tasks assessed Orientation Level: Oriented X4 Cognition Arousal/Alertness: Lethargic Behavior During Therapy: WFL for tasks assessed/performed Overall Cognitive Status: Within Functional Limits for tasks assessed  Physical Exam: Blood pressure 136/60, pulse 72, temperature 98 F (36.7 C), temperature source Oral, resp. rate 17, height 5\' 10"  (1.778 m), weight 86.7 kg, SpO2 97 %. Physical Exam  Nursing note and vitals reviewed. Constitutional: He is oriented to person, place, and time. He appears well-developed and well-nourished.  HENT:  Head: Normocephalic and atraumatic.  Eyes: EOM are normal. Right eye exhibits no discharge. Left eye exhibits no discharge.  Neck: Normal range of motion. Neck supple.  Cardiovascular:  Irregularly irregular  Respiratory: Effort normal and breath sounds normal. No stridor.  GI: Soft. Bowel sounds are normal.  Musculoskeletal: He exhibits edema (left thigh).  Edema and tenderness left flank  Neurological: He is alert and oriented to person, place, and time.  Follow simple commands Oriented x2 Motor: Right upper extremity/right lower extremity: 5/5 proximal distal Left upper extremity: 4+/5 proximal distal Left lower extremity: Hip flexion 3+/5, knee extension 2+/5, ankle dorsiflexion 4/5 Right lower extremity: Hip flexion 4-/5, knee extension 3/5, ankle dorsiflexion 4+/5 Sensation diminished light touch left upper and left lower extremity.   Skin: Skin is warm and dry.  Serosanguineous drainage from left later puncture site is decreasing.    Diffuse ecchymosis left flank down to left  lateral thigh and up to mid back. Ecchymosis along  right flank.   Multiple small incision abdomen and midline incision C/D/I.   Psychiatric: He has a normal mood and affect. His speech is delayed. He is slowed. Cognition and memory are impaired.    Results for orders placed or performed during the hospital encounter of 02/16/18 (from the past 48 hour(s))  Glucose, capillary     Status: Abnormal   Collection Time: 02/22/18 11:56 AM  Result Value Ref Range   Glucose-Capillary 147 (H) 70 - 99 mg/dL   Comment 1 Notify RN    Comment  2 Document in Chart   Glucose, capillary     Status: Abnormal   Collection Time: 02/22/18  4:30 PM  Result Value Ref Range   Glucose-Capillary 116 (H) 70 - 99 mg/dL   Comment 1 Notify RN    Comment 2 Document in Chart   Glucose, capillary     Status: Abnormal   Collection Time: 02/22/18  9:20 PM  Result Value Ref Range   Glucose-Capillary 124 (H) 70 - 99 mg/dL  Glucose, capillary     Status: Abnormal   Collection Time: 02/23/18  6:31 AM  Result Value Ref Range   Glucose-Capillary 132 (H) 70 - 99 mg/dL  Glucose, capillary     Status: Abnormal   Collection Time: 02/23/18 12:07 PM  Result Value Ref Range   Glucose-Capillary 101 (H) 70 - 99 mg/dL   Comment 1 Notify RN    Comment 2 Document in Chart   Glucose, capillary     Status: Abnormal   Collection Time: 02/23/18  5:17 PM  Result Value Ref Range   Glucose-Capillary 100 (H) 70 - 99 mg/dL   Comment 1 Notify RN    Comment 2 Document in Chart   Glucose, capillary     Status: Abnormal   Collection Time: 02/23/18  9:27 PM  Result Value Ref Range   Glucose-Capillary 103 (H) 70 - 99 mg/dL  Glucose, capillary     Status: Abnormal   Collection Time: 02/24/18  6:08 AM  Result Value Ref Range   Glucose-Capillary 131 (H) 70 - 99 mg/dL  CBC     Status: Abnormal   Collection Time: 02/24/18  8:12 AM  Result Value Ref Range   WBC 10.8 (H) 4.0 - 10.5 K/uL   RBC 3.42 (L) 4.22 - 5.81 MIL/uL   Hemoglobin 9.9  (L) 13.0 - 17.0 g/dL   HCT 32.7 (L) 39.0 - 52.0 %   MCV 95.6 80.0 - 100.0 fL   MCH 28.9 26.0 - 34.0 pg   MCHC 30.3 30.0 - 36.0 g/dL   RDW 19.9 (H) 11.5 - 15.5 %   Platelets 214 150 - 400 K/uL   nRBC 0.2 0.0 - 0.2 %    Comment: Performed at [facility], [address], [place], [place] [address]  Renal function panel     Status: Abnormal   Collection Time: 02/24/18  8:13 AM  Result Value Ref Range   Sodium 131 (L) 135 - 145 mmol/L   Potassium 4.0 3.5 - 5.1 mmol/L   Chloride 94 (L) 98 - 111 mmol/L   CO2 23 22 - 32 mmol/L   Glucose, Bld 132 (H) 70 - 99 mg/dL   BUN 67 (H) 8 - 23 mg/dL   Creatinine, Ser 8.28 (H) 0.61 - 1.24 mg/dL   Calcium 7.8 (L) 8.9 - 10.3 mg/dL   Phosphorus 7.0 (H) 2.5 - 4.6 mg/dL   Albumin 2.4 (L) 3.5 - 5.0 g/dL   GFR calc non Af Amer 6 (L) >60 mL/min   GFR calc Af Amer 7 (L) >60 mL/min   Anion gap 14 5 - 15    Comment: Performed at [facility] [address], [place],  [address]   No results found.     Medical Problem List and Plan: 1.  Deficits with mobility, transfers, self-care secondary to debility status post extensive urological procedures 2.  DVT Prophylaxis/Anticoagulation: Mechanical: Sequential compression devices, below knee Bilateral lower extremities 3. Chronic pain/Pain Management: Continue gabapentin. Controlled by tylenol  prn 4. Mood: LCSW to follow for evaluation and support.  5. Neuropsych: This patient is not fully capable of making decisions on his own behalf. 6. Skin/Wound Care: Routine pressure relief measures. Monitor wound daily for resolution of drainage.  7. Fluids/Electrolytes/Nutrition: Routine labs with HD. Renal diet with 1200 cc FR.  8. Bladder cancer s/p resection: Continue to monitor wound for healing. GU following for input.  9. ESRD: HD MWF at the end of the day to help with tolerance of therapy.  10.  CAD s/p PPM for bradycardia: Continue to monitor BP/HR bid.  11.  Anemia of chronic disease:  H/H  stable post 2 units on 11/26. Continue to monitor with serial check on HD days.  12.  Chronic A Fib: Heart rate controlled on lanoxin daily. Off metoprolol and diltiazem at this time. Resume coumadin once drainage resolves.  13. H/o Emphysema: Continue prednisone --resume prn inhaler. Has been weaned off oxygen--encourage IS as continues to require supplemental oxygen.  14. 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  90. H/o CVA: Loss of STM and vision.     Post Admission Physician Evaluation: 1. Preadmission assessment reviewed and changes made below. 2. Functional deficits secondary  to debility from extensive urological procedures. 3. Patient is admitted to receive collaborative, interdisciplinary care between the physiatrist, rehab nursing staff, and therapy team. 4. Patient's level of medical complexity and substantial therapy needs in context of that medical necessity cannot be provided at a lesser intensity of care such as a SNF. 5. Patient has experienced substantial functional loss from his/her baseline which was documented above under the "Functional History" and "Functional Status" headings.  Judging by the patient's diagnosis, physical exam, and functional history, the patient has potential for functional progress which will result in measurable gains while on inpatient rehab.  These gains will be of substantial and practical use upon discharge  in facilitating mobility and self-care at the household level. 6. Physiatrist will provide 24 hour management of medical needs as well as oversight of the therapy plan/treatment and provide guidance as appropriate regarding the interaction of the two. 7. 24 hour rehab nursing will assist with bowel management, safety, skin/wound care, disease management, medication administration, pain management and patient education  and help integrate therapy concepts, techniques,education, etc. 8. PT will assess and treat  for/with: Lower extremity strength, range of motion, stamina, balance, functional mobility, safety, adaptive techniques and equipment, wound care, coping skills, pain control, education. Goals are: Min a. 9. OT will assess and treat for/with: ADL's, functional mobility, safety, upper extremity strength, adaptive techniques and equipment, wound mgt, ego support, and community reintegration.   Goals are: Min a. Therapy may proceed with showering this patient. 10. Case Management and Social Worker will assess and treat for psychological issues and discharge planning. 11. Team conference will be held weekly to assess progress toward goals and to determine barriers to discharge. 12. Patient will receive at least 3 hours of therapy per day at least 5 days per week. 13. ELOS: 17-20 days.       14. Prognosis:  good  I have personally performed a face to face diagnostic evaluation, including, but not limited to relevant history and physical exam findings, of this patient and developed relevant assessment and plan.  Additionally, I have reviewed and concur with the physician assistant's documentation above.  The patient's status has not changed. The original post admission physician evaluation remains appropriate, and any changes from the pre-admission screening or  documentation from the acute chart are noted above.    Delice Lesch, MD, ABPMR Bary Leriche, PA-C 02/24/2018

## 2018-02-24 NOTE — Progress Notes (Signed)
Patient ID: Alexander [name], male   DOB: 1940/08/05, 77 y.o.   MRN: [id number] Patient arrived with RN via bed with patient belongings and family. This RN and sending RN completed skin assessment upon arrival to unit. Patient and family oriented to unit, rehab schedule, fall prevention plan, safety plan, health resource notebook, and seat belt alarm with verbal understanding. Patient's family requesting 4 siderails up and telesitter monitoring. Patient resting comfortably in bed with bed alarm on and soft touch call bell across chest.

## 2018-02-24 NOTE — Progress Notes (Addendum)
ANTICOAGULATION CONSULT NOTE - Initial Consult  Pharmacy Consult for warfarin Indication: atrial fibrillation  Allergies  Allergen Reactions  . Penicillins Swelling and Rash    Has patient had a PCN reaction causing immediate rash, facial/tongue/throat swelling, SOB or lightheadedness with hypotension: Yes Has patient had a PCN reaction causing severe rash involving mucus membranes or skin necrosis: No Has patient had a PCN reaction that required hospitalization: No Has patient had a PCN reaction occurring within the last 10 years: No If all of the above answers are "NO", then may proceed with Cephalosporin use.   . Atorvastatin     MYALGIA   . Codeine Nausea Only  . Tramadol Nausea Only    Patient Measurements: Height: 5\' 10"  (177.8 cm) Weight: 188 lb 7.9 oz (85.5 kg) IBW/kg (Calculated) : 73  Vital Signs: Temp: 97.8 F (36.6 C) (11/29 1530) Temp Source: Oral (11/29 1530) BP: 123/64 (11/29 1530) Pulse Rate: 59 (11/29 1530)  Labs: Recent Labs    02/22/18 0327 02/24/18 0812 02/24/18 0813 02/24/18 1530 02/24/18 1535  HGB 9.6* 9.9*  --   --   --   HCT 31.3* 32.7*  --   --   --   PLT  --  214  --   --   --   LABPROT  --   --   --   --  16.4*  INR  --   --   --   --  1.33  CREATININE 4.46*  --  8.28* 4.39*  --     Estimated Creatinine Clearance: 14.6 mL/min (A) (by C-G formula based on SCr of 4.39 mg/dL (H)).  Assessment: CC/HPI: 77 yo m transfer from inpatient to CIR  PMH: bladder cancer, ESRD MWF, Afib, CAD  Anticoag: warfarin pta for afib. Last dose PTA INR 1.33 Sq hep q8 PTA warfarin 2.5 mg MWF and 5 mg AOD   Renal: ESRD MWF  Heme/Onc: H&H 9.9/32.7, Plt 214 Some incision site bleeding from port removal prior to tx for CIR; now resolved   Goal of Therapy:  INR 2-3 Monitor platelets by anticoagulation protocol: Yes   Plan:  Warfarin 5 mg x  1 Continue heparin sq until INR > 2? Daily INR  Levester Fresh, PharmD, BCPS, BCCCP Clinical  Pharmacist [phone]  Please check AMION for all [facility] numbers  02/24/2018 4:46 PM

## 2018-02-24 NOTE — Progress Notes (Signed)
7 Days Post-Op Subjective: Patient reports no pain. Having flatus but reports no BM recently  Objective: Vital signs in last 24 hours: Temp:  [97.9 F (36.6 C)-98.8 F (37.1 C)] 98 F (36.7 C) (11/29 0741) Pulse Rate:  [66-71] 68 (11/29 0830) Resp:  [17-25] 17 (11/29 0741) BP: (107-148)/(56-71) 141/63 (11/29 0830) SpO2:  [95 %-97 %] 97 % (11/29 0741) Weight:  [86.7 kg] 86.7 kg (11/29 0741)  Intake/Output from previous day: 11/28 0701 - 11/29 0700 In: 240 [P.O.:240] Out: -  Intake/Output this shift: No intake/output data recorded.  Physical Exam:  Constitutional: Vital signs reviewed. WD WN in NAD   Eyes: PERRL, No scleral icterus.   Cardiovascular: RRR Pulmonary/Chest: Normal effort Extremities: No cyanosis or edema   Lab Results: Recent Labs    02/22/18 0327  HGB 9.6*  HCT 31.3*   BMET Recent Labs    02/22/18 0327 02/24/18 0813  NA 135 131*  K 4.0 4.0  CL 97* 94*  CO2 29 23  GLUCOSE 127* 132*  BUN 24* 67*  CREATININE 4.46* 8.28*  CALCIUM 7.9* 7.8*   No results for input(s): LABPT, INR in the last 72 hours. No results for input(s): LABURIN in the last 72 hours. Results for orders placed or performed during the hospital encounter of 02/16/18  MRSA PCR Screening     Status: None   Collection Time: 02/17/18  5:43 AM  Result Value Ref Range Status   MRSA by PCR NEGATIVE NEGATIVE Final    Comment:        The GeneXpert MRSA Assay (FDA approved for NASAL specimens only), is one component of a comprehensive MRSA colonization surveillance program. It is not intended to diagnose MRSA infection nor to guide or monitor treatment for MRSA infections. Performed at [facility], [place] [address], [place], [place] [address]     Studies/Results: No results found.  Assessment/Plan:  POD 7 bilateral nephrectomy, cystoprosatatectomy. Doing well. Apparently headed to rehab today.  Would focus on BM if he has not had one recently.   LOS: 7  days   Jorja Loa 02/24/2018, 9:21 AM

## 2018-02-25 ENCOUNTER — Inpatient Hospital Stay (HOSPITAL_COMMUNITY): Payer: Medicare Other

## 2018-02-25 ENCOUNTER — Inpatient Hospital Stay (HOSPITAL_COMMUNITY): Payer: Medicare Other | Admitting: Physical Therapy

## 2018-02-25 DIAGNOSIS — R5381 Other malaise: Principal | ICD-10-CM

## 2018-02-25 LAB — COMPREHENSIVE METABOLIC PANEL
ALT: 13 U/L (ref 0–44)
AST: 24 U/L (ref 15–41)
Albumin: 2.2 g/dL — ABNORMAL LOW (ref 3.5–5.0)
Alkaline Phosphatase: 61 U/L (ref 38–126)
Anion gap: 14 (ref 5–15)
BUN: 42 mg/dL — ABNORMAL HIGH (ref 8–23)
CO2: 26 mmol/L (ref 22–32)
Calcium: 7.9 mg/dL — ABNORMAL LOW (ref 8.9–10.3)
Chloride: 91 mmol/L — ABNORMAL LOW (ref 98–111)
Creatinine, Ser: 5.64 mg/dL — ABNORMAL HIGH (ref 0.61–1.24)
GFR calc Af Amer: 10 mL/min — ABNORMAL LOW (ref >60)
GFR calc non Af Amer: 9 mL/min — ABNORMAL LOW (ref >60)
Glucose, Bld: 117 mg/dL — ABNORMAL HIGH (ref 70–99)
Potassium: 4 mmol/L (ref 3.5–5.1)
Sodium: 131 mmol/L — ABNORMAL LOW (ref 135–145)
Total Bilirubin: 1.2 mg/dL (ref 0.3–1.2)
Total Protein: 5 g/dL — ABNORMAL LOW (ref 6.5–8.1)

## 2018-02-25 LAB — CBC WITH DIFFERENTIAL/PLATELET
ABS IMMATURE GRANULOCYTES: 0.14 10*3/uL — AB (ref 0.00–0.07)
Basophils Absolute: 0 10*3/uL (ref 0.0–0.1)
Basophils Relative: 0 %
Eosinophils Absolute: 0.2 10*3/uL (ref 0.0–0.5)
Eosinophils Relative: 2 %
HCT: 31 % — ABNORMAL LOW (ref 39.0–52.0)
Hemoglobin: 9.6 g/dL — ABNORMAL LOW (ref 13.0–17.0)
Immature Granulocytes: 1 %
Lymphocytes Relative: 5 %
Lymphs Abs: 0.5 10*3/uL — ABNORMAL LOW (ref 0.7–4.0)
MCH: 29.4 pg (ref 26.0–34.0)
MCHC: 31 g/dL (ref 30.0–36.0)
MCV: 94.8 fL (ref 80.0–100.0)
Monocytes Absolute: 0.7 10*3/uL (ref 0.1–1.0)
Monocytes Relative: 7 %
Neutro Abs: 8.9 10*3/uL — ABNORMAL HIGH (ref 1.7–7.7)
Neutrophils Relative %: 85 %
Platelets: 184 10*3/uL (ref 150–400)
RBC: 3.27 MIL/uL — ABNORMAL LOW (ref 4.22–5.81)
RDW: 19.3 % — ABNORMAL HIGH (ref 11.5–15.5)
WBC: 10.4 10*3/uL (ref 4.0–10.5)
nRBC: 0 % (ref 0.0–0.2)

## 2018-02-25 LAB — GLUCOSE, CAPILLARY
GLUCOSE-CAPILLARY: 118 mg/dL — AB (ref 70–99)
GLUCOSE-CAPILLARY: 105 mg/dL — AB (ref 70–99)
Glucose-Capillary: 107 mg/dL — ABNORMAL HIGH (ref 70–99)
Glucose-Capillary: 97 mg/dL (ref 70–99)

## 2018-02-25 LAB — PROTIME-INR
INR: 1.33
Prothrombin Time: 16.3 seconds — ABNORMAL HIGH (ref 11.4–15.2)

## 2018-02-25 MED ORDER — PRO-STAT SUGAR FREE PO LIQD
30.0000 mL | Freq: Two times a day (BID) | ORAL | Status: DC
  Administered 2018-02-25 – 2018-03-03 (×13): 30 mL via ORAL
  Filled 2018-02-25 (×13): qty 30

## 2018-02-25 MED ORDER — WARFARIN SODIUM 5 MG PO TABS
5.0000 mg | ORAL_TABLET | Freq: Once | ORAL | Status: AC
  Administered 2018-02-25: 5 mg via ORAL
  Filled 2018-02-25: qty 1

## 2018-02-25 MED ORDER — CHLORHEXIDINE GLUCONATE CLOTH 2 % EX PADS
6.0000 | MEDICATED_PAD | Freq: Every day | CUTANEOUS | Status: DC
  Administered 2018-02-27 – 2018-02-28 (×2): 6 via TOPICAL

## 2018-02-25 MED ORDER — DARBEPOETIN ALFA 150 MCG/0.3ML IJ SOSY
150.0000 ug | PREFILLED_SYRINGE | INTRAMUSCULAR | Status: DC
  Administered 2018-02-27: 150 ug via INTRAVENOUS
  Filled 2018-02-25: qty 0.3

## 2018-02-25 NOTE — Progress Notes (Signed)
Physical Therapy Assessment and Plan  Patient Details  Name: Alexander [name] MRN: [id number] Date of Birth: [birth year]  PT Diagnosis: Abnormality of gait, Cognitive deficits, Difficulty walking, Muscle weakness and Pain in BLE Rehab Potential: Good ELOS: 12-14 days   Today's Date: 02/25/2018 PT Individual Time: 1100-1200 PT Individual Time Calculation (min): 60 min    Problem List:  Patient Active Problem List   Diagnosis Date Noted  . Debility 02/24/2018  . Hypoxia   . Acute on chronic anemia   . ESRD on dialysis ([place])   . Postoperative pain   . Chronic diastolic congestive heart failure ([place])   . Coronary artery disease involving native coronary artery of native heart without angina pectoris   . Status post placement of cardiac pacemaker   . History of CVA with residual deficit   . Diabetes mellitus type 2 in nonobese (HCC)   . Atrial fibrillation ([place])   . Bladder cancer ([place]) 02/17/2018  . Abnormal CT scan, colon   . Benign neoplasm of ascending colon   . Benign neoplasm of cecum   . Benign neoplasm of transverse colon   . Benign neoplasm of descending colon   . S/P TURP 12/14/2017  . Cancer of overlapping sites of bladder ([place]) 12/13/2017  . Post-operative complication 17/61/6073  . Balanitis 10/31/2017  . Failure to thrive syndrome, adult 10/30/2017  . Hematuria 10/21/2017  . UTI (urinary tract infection) 10/20/2017  . Syncope 05/12/2017  . Near syncope 05/12/2017  . Elevated troponin 05/12/2017  . CVA (cerebral vascular accident) ([place]) 04/12/2017  . Dialysis patient ([place]) 04/12/2017  . Seasonal allergies   . S/P epidural steroid injection   . Peptic ulcer   . Paroxysmal atrial fibrillation (HCC)   . Macular degeneration   . Legally blind   . Kidney stones   . Hypertension   . ESRD (end stage renal disease) ([place])   . Dyspnea   . Diverticulosis   . Diabetes mellitus without complication ([place])   . Colon polyps   . Anemia   . Presence of permanent cardiac  pacemaker 11/24/2016  . Atrial fibrillation with RVR ([place]) 11/23/2016  . Glaucoma 11/23/2016  . GERD (gastroesophageal reflux disease) 11/23/2016  . Hypomagnesemia 11/23/2016  . Diabetes mellitus, type II ([place]) 11/23/2016  . Depression 11/23/2016  . S/P TAVR (transcatheter aortic valve replacement) 08/31/2016  . Long term (current) use of anticoagulants [Z79.01] 08/13/2016  . Anticoagulated on Coumadin 01/19/2016  . Impacted cerumen of left ear 01/19/2016  . Otorrhagia of right ear 01/19/2016  . Sudden right hearing loss 01/19/2016  . Persistent atrial fibrillation   . Carotid occlusion, left 11/04/2015  . Carpal tunnel syndrome, bilateral 11/03/2015  . Diabetic peripheral neuropathy ([place]) 10/02/2015  . Orthostatic hypotension 09/09/2015  . Bradycardia 03/30/2015  . OAG (open angle glaucoma) suspect, high risk, left 05/28/2014  . Long-term (current) use of anticoagulants 04/08/2014  . Hyperlipidemia 04/04/2014  . Primary open-angle glaucoma 07/10/2013  . ESRD (end stage renal disease) on dialysis ([place]) 03/13/2013  . Pseudophakia 01/02/2013  . PVD (posterior vitreous detachment) 01/02/2013  . PAD (peripheral artery disease) ([place]) 12/12/2012  . Occlusion and stenosis of bilateral carotid arteries 08/17/2012  . Severe aortic stenosis 06/15/2012  . Aortic stenosis 06/15/2012  . Organ transplant candidate 01/20/2012  . Exudative macular degeneration ([place]) 12/02/2011  . Retinal detachment 12/02/2011  . Hx of adenomatous colonic polyps 02/03/2011  . Esophageal reflux 02/03/2011  . Anemia, unspecified 02/03/2011  . Barrett's esophagus 05/28/2003  . Tubular  adenoma of colon 07/27/2001    Past Medical History:  Past Medical History:  Diagnosis Date  . Anemia   . Aortic stenosis 06/15/12   TEE - EF 06-30%; grade 1 diastolic dysfunction; mild/mod aortic valve stenosis; Mitral valve had calcified annulus, mild pulm htn PA peak pressure 1mHg  . Barrett's esophagus 05/2003  .  Bradycardia 2017   [employer] 2240 Assurity dual-lead pacemaker  . Cancer ([place]    bladder and kidney  . Carpal tunnel syndrome, bilateral 11/03/2015  . CHF (congestive heart failure) ([place]   . Colon polyps   . Coronary artery disease   . CVA (cerebral infarction)    2004/affected left side  . Depression   . Diabetes mellitus without complication ([place]   . Diabetic peripheral neuropathy ([place] 10/02/2015  . Diverticulosis   . Dyspnea    with exertion  . ESRD (end stage renal disease) on dialysis ([place]    "[facility]; [place]; MWF" (05/12/2017)  . Failure to thrive syndrome, adult 10/30/2017  . GERD (gastroesophageal reflux disease)   . Glaucoma   . History of kidney stones   . Hyperlipidemia   . Hypertension   . Legally blind   . Macular degeneration    both eyes  . Orthostatic hypotension 09/09/2015  . Paroxysmal atrial fibrillation (HCC)   . Peptic ulcer    bleeding, 1969  . PONV (postoperative nausea and vomiting)   . Presence of permanent cardiac pacemaker   . S/P epidural steroid injection    last  injection over 10 years ago  . Seasonal allergies   . Tubular adenoma of colon 07/2001   Past Surgical History:  Past Surgical History:  Procedure Laterality Date  . AV FISTULA PLACEMENT  2009  . BACK SURGERY    . CARDIOVERSION N/A 11/13/2015   Procedure: CARDIOVERSION;  Surgeon: TTroy Sine MD;  [facility];  Service: Cardiovascular;  Laterality: N/A;  . CARDIOVERSION N/A 01/13/2016   Procedure: CARDIOVERSION;  Surgeon: Will MMeredith [name] MD;  [place] [facility]  Service: Cardiovascular;  Laterality: N/A;  . COLONOSCOPY WITH PROPOFOL N/A 01/05/2018   Procedure: COLONOSCOPY WITH PROPOFOL;  Surgeon: SLadene Artist MD;  [facility];  Service: Endoscopy;  Laterality: N/A;  . CORNEAL TRANSPLANT  1999   right eye  . CYSTOSCOPY  several times   kidney stones  . CYSTOSCOPY W/ RETROGRADES Bilateral 12/13/2017   Procedure: CYSTOSCOPY WITH RETROGRADE  PYELOGRAM;  Surgeon: WIrine Seal MD;  [facility];  Service: Urology;  Laterality: Bilateral;  . EP IMPLANTABLE DEVICE N/A 03/11/2015   Procedure: Pacemaker Implant;  Surgeon: Will MMeredith [name] MD;  [place]  Laterality: Left  . LAMINECTOMY  1969  . POLYPECTOMY  01/05/2018   Procedure: POLYPECTOMY;  Surgeon: SLadene Artist MD;  [facility];  Service: Endoscopy;;  . RIGHT/LEFT HEART CATH AND CORONARY ANGIOGRAPHY N/A 08/20/2016   Procedure: Right/Left Heart Cath and Coronary Angiography;  Surgeon: MBurnell Blanks MD;  [facility];  Service: Cardiovascular;  Laterality: N/A;  . ROBOT ASSITED LAPAROSCOPIC NEPHROURETERECTOMY Bilateral 02/17/2018   Procedure: XI ROBOT ASSITED LAPAROSCOPIC NEPHROURETERECTOMY;  Surgeon: MAlexis Frock MD;  [facility];  Service: Urology;  Laterality: Bilateral;  . TEE WITHOUT CARDIOVERSION N/A 07/22/2016   Procedure: TRANSESOPHAGEAL ECHOCARDIOGRAM (TEE);  Surgeon: TSkeet Latch MD;  [place]  Service: Cardiovascular;  Laterality: N/A;  . TEE WITHOUT CARDIOVERSION N/A 08/31/2016   Procedure: TRANSESOPHAGEAL ECHOCARDIOGRAM (TEE);  Surgeon: MAngelena Form  Annita Brod, MD;  Location: [place];  Service: Open Heart Surgery;  Laterality: N/A;  . TEE WITHOUT CARDIOVERSION N/A 12/01/2016   Procedure: TRANSESOPHAGEAL ECHOCARDIOGRAM (TEE);  Surgeon: Larey Dresser, MD;  [facility];  Service: Cardiovascular;  Laterality: N/A;  . TONSILLECTOMY  1964  . TRANSCATHETER AORTIC VALVE REPLACEMENT, TRANSFEMORAL N/A 08/31/2016   Procedure: TRANSCATHETER AORTIC VALVE REPLACEMENT, TRANSFEMORAL;  Surgeon: Burnell Blanks, MD;  Location: [place];  Service: Open Heart Surgery;  Laterality: N/A;  . TRANSURETHRAL RESECTION OF BLADDER TUMOR N/A 12/13/2017   Procedure: TRANSURETHRAL RESECTION OF BLADDER TUMOR (TURBT);  Surgeon: Irine Seal, MD;  [facility];  Service: Urology;  Laterality:  N/A;    Assessment & Plan Clinical Impression:  SOU NOHR is a 77 year old male with history of T2DM, ESRD-HD MWF, CAF-- on chronic Coumadin, CVA with loss of short-term memory as well as worsening of visual deficits, macular degeneration/glaucoma, recent diagnosis of aggressive variant bladder cancer who was admitted on 02/17/2018 for robotic assisted laparoscopic radical cystoprostatectomy, bilateral pelvic lymphadenectomy and bilateral nephroureterectomy ureterectomy by Dr. Tresa Moore.  History taken from chart review and patient.  Postop course significant for issues with hypotension, ABLA requiring transfusion, weakness/numbness LLE, wound drainage as well as constipation. Urology recommends holding coumadin till drainage from left flank has resolved X 24 hours.  Therapy evaluations done revealing debility and CIR recommended for follow-up therapy. Patient transferred to CIR on 02/24/2018 .   Patient currently requires mod with mobility secondary to muscle weakness, decreased visual acuity, decreased problem solving, decreased safety awareness and decreased memory and decreased standing balance, decreased postural control and decreased balance strategies.  Prior to hospitalization, patient was modified independent  with mobility and lived with Family in a House home.  Home access is 1Stairs to enter.  Patient will benefit from skilled PT intervention to maximize safe functional mobility, minimize fall risk and decrease caregiver burden for planned discharge home with 24 hour supervision.  Anticipate patient will benefit from follow up [place] at discharge.  PT - End of Session Endurance Deficit: Yes Endurance Deficit Description: generalized weakness PT Assessment Rehab Potential (ACUTE/IP ONLY): Good PT Barriers to Discharge: Medical stability;Home environment access/layout PT Patient demonstrates impairments in the following area(s): Balance;Endurance;Pain;Perception;Safety;Sensory PT Transfers  Functional Problem(s): Bed Mobility;Bed to Chair;Car;Furniture;Floor PT Locomotion Functional Problem(s): Ambulation;Stairs PT Plan PT Intensity: Minimum of 1-2 x/day ,45 to 90 minutes PT Frequency: 5 out of 7 days PT Duration Estimated Length of Stay: 12-14 days PT Treatment/Interventions: Ambulation/gait training;Balance/vestibular training;Community reintegration;Discharge planning;Cognitive remediation/compensation;Disease management/prevention;DME/adaptive equipment instruction;Functional mobility training;Neuromuscular re-education;Pain management;Patient/family education;Psychosocial support;Stair training;Therapeutic Activities;Therapeutic Exercise;UE/LE Strength taining/ROM;UE/LE Coordination activities;Visual/perceptual remediation/compensation PT Transfers Anticipated Outcome(s): Supervision PT Locomotion Anticipated Outcome(s): Supervision with RW PT Recommendation Follow Up Recommendations: Home health PT Patient destination: Home Equipment Recommended: Rolling walker with 5" wheels Equipment Details: pt owns rollator, may need RW  Skilled Therapeutic Intervention Evaluation completed (see details above and below) with education on PT POC and goals and individual treatment initiated with focus on functional transfers and education with patient and family about rehab expectations, goals, LOS, etc. Pt reports onset of BLE pain with mobility, subsides once at rest. Bed mobility min A for LE management. Sit to stand with mod A to RW, posterior lean upon initially standing. SPT bed to w/c with RW and mod A for balance. Ambulation 2 x 15 ft with RW and mod A for balance, w/c follow for safety. Ascend/descend two 3" stairs with 2 handrails and mod A for  balance, step-to gait pattern. Car transfer with mod A with max cueing for safety due to cognitive and visual deficits. Pt fatigues quickly with activity and needs cues to stay awake throughout therapy session. Pt's daughter and wife present  during therapy session and able to assist in providing PLOF information. Pt left seated in w/c in room with needs in reach, family present, quick-release belt and chair alarm in place at end of therapy eval.  PT Evaluation Precautions/Restrictions Precautions Precautions: Fall Precaution Comments: Pt is blind, can seen peripherally on the L slightly Restrictions Weight Bearing Restrictions: No Home Living/Prior Functioning Home Living Available Help at Discharge: Family;Available 24 hours/day Type of Home: House Home Access: Stairs to enter [employer] of Steps: 1 Entrance Stairs-Rails: None Home Layout: One level Alternate Level Stairs-Number of Steps: 1 Bathroom Shower/Tub: Multimedia programmer: Handicapped height Bathroom Accessibility: No Additional Comments: 1 daughter is local  Lives With: Family Prior Function Level of Independence: Independent with gait;Independent with transfers;Requires assistive device for independence  Able to Take Stairs?: Yes(one step with rollator) Driving: No Vocation: Retired Comments: reports no residual L weakness, some aphasia Vision/Perception  Vision - History Baseline Vision: Legally blind Vision - Assessment Eye Alignment: Impaired (comment)(R gaze preference) Ocular Range of Motion: Impaired-to be further tested in functional context Alignment/Gaze Preference: Gaze right Tracking/Visual Pursuits: Unable to hold eye position out of midline;Impaired - to be further tested in functional context Perception Perception: Within Functional Limits Praxis Praxis: Intact  Cognition Overall Cognitive Status: Impaired/Different from baseline Arousal/Alertness: Lethargic Orientation Level: Oriented to person;Oriented to situation;Disoriented to place;Disoriented to time Attention: Sustained Sustained Attention: Appears intact Memory: Impaired Memory Impairment: Decreased short term memory Decreased Short Term Memory:  Verbal basic;Functional complex Awareness: Impaired Awareness Impairment: Intellectual impairment Problem Solving: Impaired Problem Solving Impairment: Verbal basic Safety/Judgment: Impaired Sensation Sensation Light Touch: Appears Intact(pt reports numbness in BLE) Hot/Cold: Appears Intact Proprioception: Appears Intact Coordination Gross Motor Movements are Fluid and Coordinated: No Fine Motor Movements are Fluid and Coordinated: No Motor  Motor Motor: Other (comment) Motor - Skilled Clinical Observations: generalized weakness  Mobility Bed Mobility Bed Mobility: Sit to Supine Sit to Supine: Minimal Assistance - Patient > 75% Transfers Transfers: Sit to Stand;Stand Pivot Transfers Sit to Stand: Moderate Assistance - Patient 50-74% Stand to Sit: Moderate Assistance - Patient 50-74% Stand Pivot Transfers: Moderate Assistance - Patient 50 - 74% Stand Pivot Transfer Details: Verbal cues for technique;Verbal cues for safe use of DME/AE;Tactile cues for sequencing;Tactile cues for placement Transfer (Assistive device): Rolling walker Locomotion  Gait Gait Distance (Feet): 15 Feet Assistive device: Rolling walker Gait Gait Pattern: Impaired(flexed trunk, flexed B knees, decreased B step length) Knee - Stance Phase - Impaired Gait Pattern: Increased knee flexion - Left;Increased knee flexion - Right Ankle - Swing Phase- Impaired Gait Pattern: Increased time to advance - Left;Increased time to advance - Right Gait velocity: decreased Stairs / Additional Locomotion Stairs: Yes Stairs Assistance: Moderate Assistance - Patient 50 - 74% Stair Management Technique: Two rails;Step to pattern Number of Stairs: 2 Height of Stairs: 3 Wheelchair Mobility Wheelchair Mobility: No  Trunk/Postural Assessment  Cervical Assessment Cervical Assessment: Exceptions to WFL(forward head) Thoracic Assessment Thoracic Assessment: Exceptions to WFL(rounded shoulders) Lumbar Assessment Lumbar  Assessment: Exceptions to WFL(posterior pelvic tilt) Postural Control Postural Control: Deficits on evaluation Righting Reactions: delayed, posterior lean in standing  Balance Balance Balance Assessed: Yes Static Sitting Balance Static Sitting - Balance Support: Feet supported;No upper extremity supported Static Sitting - Level of  Assistance: 5: Stand by assistance Dynamic Sitting Balance Dynamic Sitting - Balance Support: Feet supported;No upper extremity supported Dynamic Sitting - Level of Assistance: 4: Min assist Dynamic Sitting - Balance Activities: Other (comment) Sitting balance - Comments: reaching forward Static Standing Balance Static Standing - Balance Support: Bilateral upper extremity supported;During functional activity Static Standing - Level of Assistance: 4: Min assist Dynamic Standing Balance Dynamic Standing - Balance Support: Bilateral upper extremity supported;During functional activity Dynamic Standing - Level of Assistance: 3: Mod assist Dynamic Standing - Balance Activities: Reaching across midline Extremity Assessment  RUE Assessment RUE Assessment: Exceptions to [facility] General Strength Comments: PLOF with AROM limitations LUE Assessment LUE Assessment: Exceptions to [facility] General Strength Comments: PLOF with AROM limitations RLE Assessment RLE Assessment: Exceptions to [facility] Active Range of Motion (AROM) Comments: tight HS General Strength Comments: 4/5 hip flex and knee ext, 3/5 knee flex, 4/5 ankle DF LLE Assessment LLE Assessment: Exceptions to [facility] Active Range of Motion (AROM) Comments: tight HS General Strength Comments: 4/5 hip flex and knee ext, 3/5 knee flex, 4/5 ankle DF    Refer to Care Plan for Long Term Goals  Recommendations for other services: None   Discharge Criteria: Patient will be discharged from PT if patient refuses treatment 3 consecutive times without medical reason, if treatment goals not met, if there is a change in medical  status, if patient makes no progress towards goals or if patient is discharged from hospital.  The above assessment, treatment plan, treatment alternatives and goals were discussed and mutually agreed upon: by patient and by family   Excell Seltzer, PT, DPT  02/25/2018, 12:28 PM

## 2018-02-25 NOTE — Progress Notes (Signed)
Subjective: Patient without complaints.  He is lying in bed.  He is eager for therapy to get started.  Objective: BP (!) 118/54 (BP Location: Right Arm)   Pulse 65   Temp 97.8 F (36.6 C)   Resp 18   Ht 5\' 10"  (1.778 m)   Wt 86.1 kg   SpO2 97%   BMI 27.24 kg/m   elderly well-nourished male in no acute distress. HEENT exam atraumatic, normocephalic, neck supple without jugular venous distention. Chest clear to auscultation cardiac exam S1-S2 are IRregular. Abdominal exam overweight with bowel sounds, soft and nontender. Extremities no edema. Neurologic exam is alert  Assessment and plan: 1.  Deficits with mobility, transfers, self-care secondary to debility status post extensive urological procedures 2.  DVT Prophylaxis/Anticoagulation: Mechanical: Sequential compression devices, below knee Bilateral lower extremities 3. Chronic pain/Pain Management: Continue gabapentin. Controlled by tylenol prn 4. Mood: LCSW to follow for evaluation and support.  5. Neuropsych: This patient is not fully capable of making decisions on his own behalf. 6. Skin/Wound Care: Routine pressure relief measures. Monitor wound daily for resolution of drainage.  7. Fluids/Electrolytes/Nutrition: Routine labs with HD. Renal diet with 1200 cc FR.  8. Bladder cancer s/p resection: Continue to monitor wound for healing. GU following for input.  9. ESRD: HD MWF at the end of the day to help with tolerance of therapy.  10.  CAD s/p PPM for bradycardia: Continue to monitor BP/HR bid.  11.  Anemia of chronic disease:  H/H stable post 2 units on 11/26. Continue to monitor with serial check on HD days.  12.  Chronic A Fib: Heart rate controlled on lanoxin daily. Off metoprolol and diltiazem at this time. Resume coumadin once drainage resolves.  13. H/o Emphysema: Continue prednisone --resume prn inhaler. Has been weaned off oxygen--encourage IS as continues to require supplemental oxygen.  14. Macular degeneration/glaucoma:  Blind except for one quadrant left lateral field.  9. H/o CVA: Loss of STM and vision.

## 2018-02-25 NOTE — Evaluation (Signed)
Occupational Therapy Assessment and Plan  Patient Details  Name: Alexander [name] MRN: [id number] Date of Birth: [birth year]  OT Diagnosis: disturbance of vision and muscle weakness (generalized) Rehab Potential: Rehab Potential (ACUTE ONLY): Fair ELOS: 10-14 days   Today's Date: 02/25/2018 OT Individual Time: 5916-3846 OT Individual Time Calculation (min): 75 min     Problem List:  Patient Active Problem List   Diagnosis Date Noted  . Debility 02/24/2018  . Hypoxia   . Acute on chronic anemia   . ESRD on dialysis (Home)   . Postoperative pain   . Chronic diastolic congestive heart failure ([place])   . Coronary artery disease involving native coronary artery of native heart without angina pectoris   . Status post placement of cardiac pacemaker   . History of CVA with residual deficit   . Diabetes mellitus type 2 in nonobese (HCC)   . Atrial fibrillation ([place])   . Bladder cancer ([place]) 02/17/2018  . Abnormal CT scan, colon   . Benign neoplasm of ascending colon   . Benign neoplasm of cecum   . Benign neoplasm of transverse colon   . Benign neoplasm of descending colon   . S/P TURP 12/14/2017  . Cancer of overlapping sites of bladder ([place]) 12/13/2017  . Post-operative complication 65/99/3570  . Balanitis 10/31/2017  . Failure to thrive syndrome, adult 10/30/2017  . Hematuria 10/21/2017  . UTI (urinary tract infection) 10/20/2017  . Syncope 05/12/2017  . Near syncope 05/12/2017  . Elevated troponin 05/12/2017  . CVA (cerebral vascular accident) ([place]) 04/12/2017  . Dialysis patient ([place]) 04/12/2017  . Seasonal allergies   . S/P epidural steroid injection   . Peptic ulcer   . Paroxysmal atrial fibrillation (HCC)   . Macular degeneration   . Legally blind   . Kidney stones   . Hypertension   . ESRD (end stage renal disease) ([place])   . Dyspnea   . Diverticulosis   . Diabetes mellitus without complication ([place])   . Colon polyps   . Anemia   . Presence of permanent cardiac  pacemaker 11/24/2016  . Atrial fibrillation with RVR ([place]) 11/23/2016  . Glaucoma 11/23/2016  . GERD (gastroesophageal reflux disease) 11/23/2016  . Hypomagnesemia 11/23/2016  . Diabetes mellitus, type II ([place]) 11/23/2016  . Depression 11/23/2016  . S/P TAVR (transcatheter aortic valve replacement) 08/31/2016  . Long term (current) use of anticoagulants [Z79.01] 08/13/2016  . Anticoagulated on Coumadin 01/19/2016  . Impacted cerumen of left ear 01/19/2016  . Otorrhagia of right ear 01/19/2016  . Sudden right hearing loss 01/19/2016  . Persistent atrial fibrillation   . Carotid occlusion, left 11/04/2015  . Carpal tunnel syndrome, bilateral 11/03/2015  . Diabetic peripheral neuropathy ([place]) 10/02/2015  . Orthostatic hypotension 09/09/2015  . Bradycardia 03/30/2015  . OAG (open angle glaucoma) suspect, high risk, left 05/28/2014  . Long-term (current) use of anticoagulants 04/08/2014  . Hyperlipidemia 04/04/2014  . Primary open-angle glaucoma 07/10/2013  . ESRD (end stage renal disease) on dialysis ([place]) 03/13/2013  . Pseudophakia 01/02/2013  . PVD (posterior vitreous detachment) 01/02/2013  . PAD (peripheral artery disease) ([place]) 12/12/2012  . Occlusion and stenosis of bilateral carotid arteries 08/17/2012  . Severe aortic stenosis 06/15/2012  . Aortic stenosis 06/15/2012  . Organ transplant candidate 01/20/2012  . Exudative macular degeneration ([place]) 12/02/2011  . Retinal detachment 12/02/2011  . Hx of adenomatous colonic polyps 02/03/2011  . Esophageal reflux 02/03/2011  . Anemia, unspecified 02/03/2011  . Barrett's esophagus 05/28/2003  . Tubular adenoma  of colon 07/27/2001    Past Medical History:  Past Medical History:  Diagnosis Date  . Anemia   . Aortic stenosis 06/15/12   TEE - EF 11-94%; grade 1 diastolic dysfunction; mild/mod aortic valve stenosis; Mitral valve had calcified annulus, mild pulm htn PA peak pressure 17mHg  . Barrett's esophagus 05/2003  .  Bradycardia 2017   [employer] 2240 Assurity dual-lead pacemaker  . Cancer ([place]    bladder and kidney  . Carpal tunnel syndrome, bilateral 11/03/2015  . CHF (congestive heart failure) ([place]   . Colon polyps   . Coronary artery disease   . CVA (cerebral infarction)    2004/affected left side  . Depression   . Diabetes mellitus without complication ([place]   . Diabetic peripheral neuropathy ([place] 10/02/2015  . Diverticulosis   . Dyspnea    with exertion  . ESRD (end stage renal disease) on dialysis ([place]    "[facility]; [place]; MWF" (05/12/2017)  . Failure to thrive syndrome, adult 10/30/2017  . GERD (gastroesophageal reflux disease)   . Glaucoma   . History of kidney stones   . Hyperlipidemia   . Hypertension   . Legally blind   . Macular degeneration    both eyes  . Orthostatic hypotension 09/09/2015  . Paroxysmal atrial fibrillation (HCC)   . Peptic ulcer    bleeding, 1969  . PONV (postoperative nausea and vomiting)   . Presence of permanent cardiac pacemaker   . S/P epidural steroid injection    last  injection over 10 years ago  . Seasonal allergies   . Tubular adenoma of colon 07/2001   Past Surgical History:  Past Surgical History:  Procedure Laterality Date  . AV FISTULA PLACEMENT  2009  . BACK SURGERY    . CARDIOVERSION N/A 11/13/2015   Procedure: CARDIOVERSION;  Surgeon: TTroy Sine MD;  [facility];  Service: Cardiovascular;  Laterality: N/A;  . CARDIOVERSION N/A 01/13/2016   Procedure: CARDIOVERSION;  Surgeon: Will MMeredith [name] MD;  [place]  Service: Cardiovascular;  Laterality: N/A;  . COLONOSCOPY WITH PROPOFOL N/A 01/05/2018   Procedure: COLONOSCOPY WITH PROPOFOL;  Surgeon: SLadene Artist MD;  [facility];  Service: Endoscopy;  Laterality: N/A;  . CORNEAL TRANSPLANT  1999   right eye  . CYSTOSCOPY  several times   kidney stones  . CYSTOSCOPY W/ RETROGRADES Bilateral 12/13/2017   Procedure: CYSTOSCOPY WITH RETROGRADE  PYELOGRAM;  Surgeon: WIrine Seal MD;  [facility];  Service: Urology;  Laterality: Bilateral;  . EP IMPLANTABLE DEVICE N/A 03/11/2015   Procedure: Pacemaker Implant;  Surgeon: Will MMeredith [name] MD;  [place]  Laterality: Left  . LAMINECTOMY  1969  . POLYPECTOMY  01/05/2018   Procedure: POLYPECTOMY;  Surgeon: SLadene Artist MD;  [facility];  Service: Endoscopy;;  . RIGHT/LEFT HEART CATH AND CORONARY ANGIOGRAPHY N/A 08/20/2016   Procedure: Right/Left Heart Cath and Coronary Angiography;  Surgeon: MBurnell Blanks MD;  [facility];  Service: Cardiovascular;  Laterality: N/A;  . ROBOT ASSITED LAPAROSCOPIC NEPHROURETERECTOMY Bilateral 02/17/2018   Procedure: XI ROBOT ASSITED LAPAROSCOPIC NEPHROURETERECTOMY;  Surgeon: MAlexis Frock MD;  [facility];  Service: Urology;  Laterality: Bilateral;  . TEE WITHOUT CARDIOVERSION N/A 07/22/2016   Procedure: TRANSESOPHAGEAL ECHOCARDIOGRAM (TEE);  Surgeon: TSkeet Latch MD;  [place]  Service: Cardiovascular;  Laterality: N/A;  . TEE WITHOUT CARDIOVERSION N/A 08/31/2016   Procedure: TRANSESOPHAGEAL ECHOCARDIOGRAM (TEE);  Surgeon: MLauree Chandler  D, MD;  Location: [place];  Service: Open Heart Surgery;  Laterality: N/A;  . TEE WITHOUT CARDIOVERSION N/A 12/01/2016   Procedure: TRANSESOPHAGEAL ECHOCARDIOGRAM (TEE);  Surgeon: Larey Dresser, MD;  [facility];  Service: Cardiovascular;  Laterality: N/A;  . TONSILLECTOMY  1964  . TRANSCATHETER AORTIC VALVE REPLACEMENT, TRANSFEMORAL N/A 08/31/2016   Procedure: TRANSCATHETER AORTIC VALVE REPLACEMENT, TRANSFEMORAL;  Surgeon: Burnell Blanks, MD;  Location: [place];  Service: Open Heart Surgery;  Laterality: N/A;  . TRANSURETHRAL RESECTION OF BLADDER TUMOR N/A 12/13/2017   Procedure: TRANSURETHRAL RESECTION OF BLADDER TUMOR (TURBT);  Surgeon: Irine Seal, MD;  [facility];  Service: Urology;  Laterality:  N/A;    Assessment & Plan Clinical Impression: JEKHI BOLIN is a 77 year old male with history of T2DM, ESRD-HD MWF, CAF-- on chronic Coumadin, CVA with loss of short-term memory as well as worsening of visual deficits, macular degeneration/glaucoma, recent diagnosis of aggressive variant bladder cancer who was admitted on 02/17/2018 for robotic assisted laparoscopic radical cystoprostatectomy, bilateral pelvic lymphadenectomy and bilateral nephroureterectomy ureterectomy by Dr. Tresa Moore.  History taken from chart review and patient.  Postop course significant for issues with hypotension, ABLA requiring transfusion, weakness/numbness LLE, wound drainage as well as constipation. Urology recommends holding coumadin till drainage from left flank has resolved X 24 hours.  Therapy evaluations done revealing debility and CIR recommended for follow-up therapy.  Patient transferred to CIR on 02/24/2018 .    Patient currently requires min with basic self-care skills secondary to muscle weakness, decreased cardiorespiratoy endurance, decreased standing balance, and decreased coordination.  Prior to hospitalization, patient could complete ADLs with modified independent .  Patient will benefit from skilled intervention to decrease level of assist with basic self-care skills and increase level of independence with iADL prior to discharge home with care partner.  Anticipate patient will require 24 hour supervision and follow up home health.  OT - End of Session Activity Tolerance: Tolerates 10 - 20 min activity with multiple rests Endurance Deficit: Yes Endurance Deficit Description: generalized weakness OT Assessment Rehab Potential (ACUTE ONLY): Fair OT Patient demonstrates impairments in the following area(s): Balance;Safety;Sensory;Endurance;Motor;Pain;Vision;Perception;Cognition OT Basic ADL's Functional Problem(s): Eating;Grooming;Bathing;Dressing;Toileting OT Transfers Functional Problem(s):  Toilet;Tub/Shower OT Additional Impairment(s): None OT Plan OT Intensity: Minimum of 1-2 x/day, 45 to 90 minutes OT Frequency: 5 out of 7 days OT Duration/Estimated Length of Stay: 10-14 days OT Treatment/Interventions: Balance/vestibular training;Discharge planning;Pain management;Self Care/advanced ADL retraining;Therapeutic Activities;UE/LE Coordination activities;Cognitive remediation/compensation;Functional mobility training;Disease mangement/prevention;Patient/family education;Skin care/wound managment;Therapeutic Exercise;Visual/perceptual remediation/compensation;DME/adaptive equipment instruction;Community reintegration;Neuromuscular re-education;Psychosocial support;UE/LE Strength taining/ROM;Wheelchair propulsion/positioning OT Self Feeding Anticipated Outcome(s): set up OT Basic Self-Care Anticipated Outcome(s): set up OT Toileting Anticipated Outcome(s): set up OT Bathroom Transfers Anticipated Outcome(s): set up OT Recommendation Recommendations for Other Services: Speech consult;Neuropsych consult Patient destination: Home Follow Up Recommendations: Home health OT Equipment Recommended: To be determined   Skilled Therapeutic Intervention Skilled OT evaluation initiated with pt. Pt and daughter present educated on OT POC, ELOS, goals, and condition insight. Pt completed b/d EOB with fair sitting balance. Pt donned shirt with (S). Min A required to don pants, with pt able to thread B LE through, requiring balance support when standing. Pt able to complete posterior peri hygiene with mod standing balance support. Pt donned socks with manual facilitation provided to get into figure 4 position, mod A overall. Pt very fatigued and requiring frequent extended rest breaks throughout session. Pt completed bicep curl test- scoring within the average range for his age group. Mod A  provided during stand pivot transfers. Pt was left supine in bed with all needs met and bed alarm set/telesitter  on.   OT Evaluation Precautions/Restrictions  Precautions Precautions: Fall Precaution Comments: Pt is blind, can seen peripherally on the L slightly Restrictions Weight Bearing Restrictions: No General Chart Reviewed: Yes Family/Caregiver Present: Yes Vital Signs Therapy Vitals BP: (!) 124/57 Patient Position (if appropriate): Lying Pain Pain Assessment Pain Scale: 0-10 Pain Score: 0-No pain Home Living/Prior Functioning Home Living Family/patient expects to be discharged to:: Private residence Living Arrangements: Spouse/significant other Available Help at Discharge: Family, Available 24 hours/day Type of Home: House Home Access: Stairs to enter Technical brewer of Steps: 1 Entrance Stairs-Rails: None Home Layout: One level Alternate Level Stairs-Number of Steps: 1 Bathroom Shower/Tub: Multimedia programmer: Handicapped height Bathroom Accessibility: No Additional Comments: 1 daughter is local  Lives With: Family IADL History Homemaking Responsibilities: No Current License: No Occupation: Retired Tax adviser: used to play golf Prior Function Level of Independence: Independent with gait, Independent with transfers, Requires assistive device for independence  Able to Take Stairs?: Yes(one step with rollator) Driving: No Vocation: Retired Comments: reports no residual L weakness, some aphasia   Vision Baseline Vision/History: Legally blind;Macular Degeneration Patient Visual Report: No change from baseline Vision Assessment?: Yes Eye Alignment: Impaired (comment)(R gaze preference) Ocular Range of Motion: Impaired-to be further tested in functional context Alignment/Gaze Preference: Gaze right Tracking/Visual Pursuits: Unable to hold eye position out of midline;Impaired - to be further tested in functional context Visual Fields: Right visual field deficit;Left visual field deficit Perception  Perception: Within Functional  Limits Praxis Praxis: Intact Cognition Overall Cognitive Status: Impaired/Different from baseline Orientation Level: Person;Place;Situation Person: Oriented Place: Oriented Situation: Oriented Year: Other (Comment)(1989) Month: November Day of Week: Correct Memory: Impaired Memory Impairment: Decreased short term memory Decreased Short Term Memory: Verbal basic;Functional complex Immediate Memory Recall: Sock;Blue;Bed Memory Recall: Blue Memory Recall Blue: Without Cue Attention: Sustained Sustained Attention: Appears intact Awareness: Impaired Awareness Impairment: Intellectual impairment Problem Solving: Impaired Problem Solving Impairment: Verbal basic Safety/Judgment: Impaired Sensation Sensation Light Touch: Appears Intact(pt reports numbness in BLE) Hot/Cold: Appears Intact Proprioception: Appears Intact Coordination Gross Motor Movements are Fluid and Coordinated: No Fine Motor Movements are Fluid and Coordinated: No Motor  Motor Motor: Other (comment) Motor - Skilled Clinical Observations: generalized weakness Mobility  Bed Mobility Bed Mobility: Sit to Supine Sit to Supine: Moderate Assistance - Patient 50-74% Transfers Sit to Stand: Minimal Assistance - Patient > 75% Stand to Sit: Minimal Assistance - Patient > 75%  Trunk/Postural Assessment  Cervical Assessment Cervical Assessment: Exceptions to WFL(forward head) Thoracic Assessment Thoracic Assessment: Exceptions to WFL(rounded shoulders) Lumbar Assessment Lumbar Assessment: Exceptions to WFL(posterior pelvic tilt) Postural Control Postural Control: Deficits on evaluation Righting Reactions: delayed, posterior lean in standing  Balance Balance Balance Assessed: Yes Static Sitting Balance Static Sitting - Balance Support: Feet supported Static Sitting - Level of Assistance: 5: Stand by assistance Dynamic Sitting Balance Dynamic Sitting - Balance Support: Feet supported Dynamic Sitting - Level  of Assistance: 4: Min assist Dynamic Sitting - Balance Activities: Other (comment) Sitting balance - Comments: reaching forward Static Standing Balance Static Standing - Balance Support: During functional activity Static Standing - Level of Assistance: 4: Min assist Dynamic Standing Balance Dynamic Standing - Balance Support: During functional activity Dynamic Standing - Level of Assistance: 3: Mod assist Dynamic Standing - Balance Activities: Reaching across midline Extremity/Trunk Assessment RUE Assessment RUE Assessment: Exceptions to [facility] General Strength Comments: PLOF with AROM  limitations LUE Assessment LUE Assessment: Exceptions to [facility] General Strength Comments: PLOF with AROM limitations     Refer to Care Plan for Long Term Goals  Recommendations for other services: Neuropsych   Discharge Criteria: Patient will be discharged from OT if patient refuses treatment 3 consecutive times without medical reason, if treatment goals not met, if there is a change in medical status, if patient makes no progress towards goals or if patient is discharged from hospital.  The above assessment, treatment plan, treatment alternatives and goals were discussed and mutually agreed upon: by patient and by family  Curtis Sites 02/25/2018, 11:56 AM

## 2018-02-25 NOTE — Plan of Care (Signed)
  Problem: Consults Goal: RH GENERAL PATIENT EDUCATION Description See Patient Education module for education specifics. Outcome: Progressing Goal: Skin Care Protocol Initiated - if Braden Score 18 or less Description If consults are not indicated, leave blank or document N/A Outcome: Progressing Goal: Diabetes Guidelines if Diabetic/Glucose > 140 Description If diabetic or lab glucose is > 140 mg/dl - Initiate Diabetes/Hyperglycemia Guidelines & Document Interventions  Outcome: Progressing   Problem: RH BOWEL ELIMINATION Goal: RH STG MANAGE BOWEL WITH ASSISTANCE Description STG Manage Bowel with moderate Assistance.  Outcome: Progressing Goal: RH STG MANAGE BOWEL W/MEDICATION W/ASSISTANCE Description STG Manage Bowel with Medication and with moderate Assistance.  Outcome: Progressing   Problem: RH SKIN INTEGRITY Goal: RH STG SKIN FREE OF INFECTION/BREAKDOWN Description Skin to remain free from infection and breakdown while on rehab with moderate assistance and using available skin care resources.  Outcome: Progressing   Problem: RH SAFETY Goal: RH STG ADHERE TO SAFETY PRECAUTIONS W/ASSISTANCE/DEVICE Description STG Adhere to Safety Precautions With min to mod Assistance and using appropriate assistive Device.  Outcome: Progressing   Problem: RH PAIN MANAGEMENT Goal: RH STG PAIN MANAGED AT OR BELOW PT'S PAIN GOAL Description Less than 3 on a 1-10 pain scale.  Outcome: Progressing   Problem: RH KNOWLEDGE DEFICIT GENERAL Goal: RH STG INCREASE KNOWLEDGE OF SELF CARE AFTER HOSPITALIZATION Description Patient and family will demonstrate knowledge of how to care for at discharge concerning medication management, dietary and fluid restrictions, and follow-up care with the physician with min assist from rehab staff.  Outcome: Progressing

## 2018-02-25 NOTE — Progress Notes (Addendum)
[facility] Progress Note   Dialysis Orders: MWF [facility]  4h  86.5kg  2/2 bath   Hep 2800   Qb 450  L RC AVF  - hect 3ug  - mircera 150 q 2wks, last ?  Assessment/ Plan 1. Bladder cancer, "aggressive variant" - sp cystoprostatectomy+ bilateral nephrectomy 11/22, path pending.  Post op hypotension resolved < 24hrs. Per urology  Path pending 2. ESRD: HD MWF. Next HD Monday - ok to use heparin with HD. Na still low still needs more volume off with HD Monday- HD orders written for Monday 3. Anemia due to CKD + ABL:  Symptomatic anemia, sp 2u prbc on 11/26 and 1u on 11/23. Hb 9.6 11/30 - stable- ESA not given during acute admission - will resume Monday 4. MBD ckd: Resumed phosphorus binders/sensipar and Hectorol with dialysis. 5. AFib/ SP PPM - heart rates 70's on digoxin, home metop/ diltiazem still on hold (resume prn). Coumadin on hold until "drainage from left sided incision has resolved x 24 hrs" per urology.  6. Debility - CIR adm 11/29 7. Blindness- chronic issue 8. H/o TAVRS  9. CAD - heart cath 2018 minimal CAD < 30% stenosis 10. Nutrition - very poor intake - spoke with wife and daughter - plan liberalize to regular diet with usual fluid restriction - ok to bring food from home alb 2.2 Needs adequate intake to help with rehab process 11. HTN/volume - net UF 2 L 11/29   Myriam Jacobson, PA-C [facility] Beeper [phone] 02/25/2018,11:10 AM  LOS: 1 day   Pt seen, examined and agree w A/P as above.  Kelly Splinter MD [facility] pager [phone]   02/25/2018, 12:46 PM    Subjective:   Eating poorly - no appetite. Exhausted from PT  Objective Vitals:   02/24/18 2024 02/25/18 0500 02/25/18 0525 02/25/18 1054  BP: 109/60  (!) 118/54 (!) 124/57  Pulse: 71  65   Resp: 14  18   Temp: 99.1 F (37.3 C)  97.8 F (36.6 C)   TempSrc:      SpO2: 97%  97%   Weight:  86.1 kg    Height:       Physical Exam General: elderly gentleman  breathing easily Heart: RRR Lungs: no rales Abdomen: soft NT  Extremities: no LE edema Dialysis Access:  Left lower AVF + bruit   Additional Objective Labs: Basic Metabolic Panel: Recent Labs  Lab 02/20/18 0829  02/24/18 0813 02/24/18 1530 02/25/18 0619  NA  --    < > 131* 134* 131*  K  --    < > 4.0 3.8 4.0  CL  --    < > 94* 97* 91*  CO2  --    < > 23 28 26   GLUCOSE  --    < > 132* 111* 117*  BUN  --    < > 67* 29* 42*  CREATININE  --    < > 8.28* 4.39* 5.64*  CALCIUM  --    < > 7.8* 7.9* 7.9*  PHOS 4.4  --  7.0*  --   --    < > = values in this interval not displayed.   Liver Function Tests: Recent Labs  Lab 02/18/18 1545 02/24/18 0813 02/25/18 0619  AST  --   --  24  ALT  --   --  13  ALKPHOS  --   --  61  BILITOT  --   --  1.2  PROT  --   --  5.0*  ALBUMIN 2.6* 2.4* 2.2*   No results for input(s): LIPASE, AMYLASE in the last 168 hours. CBC: Recent Labs  Lab 02/22/18 0327 02/24/18 0812 02/25/18 0619  WBC  --  10.8* 10.4  NEUTROABS  --   --  8.9*  HGB 9.6* 9.9* 9.6*  HCT 31.3* 32.7* 31.0*  MCV  --  95.6 94.8  PLT  --  214 184   Blood Culture    Component Value Date/Time   SDES  10/28/2017 1442    URINE, RANDOM Performed at [facility], [place] [address], [place], [place] [address]    SPECREQUEST  10/28/2017 1442    NONE Performed at [facility], [place] [address] [place], [place] [address]    CULT 80,000 COLONIES/mL ESCHERICHIA COLI (A) 10/28/2017 1442   REPTSTATUS 10/30/2017 FINAL 10/28/2017 1442    Cardiac Enzymes: No results for input(s): CKTOTAL, CKMB, CKMBINDEX, TROPONINI in the last 168 hours. CBG: Recent Labs  Lab 02/24/18 0608 02/24/18 1217 02/24/18 1641 02/24/18 2144 02/25/18 0700  GLUCAP 131* 124* 79 94 118*   Iron Studies: No results for input(s): IRON, TIBC, TRANSFERRIN, FERRITIN in the last 72 hours. Lab Results  Component Value Date   INR 1.33 02/25/2018   INR 1.33 02/24/2018    INR 1.8 (A) 02/08/2018   Studies/Results: No results found. Medications:  . bromocriptine  5 mg Oral QODAY  . Chlorhexidine Gluconate Cloth  6 each Topical Q0600  . cinacalcet  60 mg Oral Q supper  . digoxin  62.5 mcg Oral Daily  . docusate sodium  100 mg Oral BID  . dorzolamide-timolol  1 drop Both Eyes BID  . [START ON 02/27/2018] doxercalciferol  3 mcg Intravenous Q M,W,F-HD  . gabapentin  200 mg Oral QHS  . heparin  5,000 Units Subcutaneous Q8H  . insulin aspart  0-15 Units Subcutaneous TID WC  . latanoprost  1 drop Both Eyes QHS  . multivitamin  1 tablet Oral QHS  . pantoprazole  40 mg Oral Daily  . polyethylene glycol  17 g Oral Daily  . pravastatin  10 mg Oral Daily  . predniSONE  5 mg Oral QHS  . senna-docusate  2 tablet Oral QHS  . sertraline  50 mg Oral Daily  . sevelamer carbonate  1,600 mg Oral TID WC  . warfarin  5 mg Oral ONCE-1800  . Warfarin - Pharmacist Dosing Inpatient   Does not apply [phone]

## 2018-02-25 NOTE — Progress Notes (Signed)
Occupational Therapy Session Note  Patient Details  Name: Alexander [name] MRN: [id number] Date of Birth: 1940/12/23  Today's Date: 02/25/2018 OT Individual Time: 1430-1535 OT Individual Time Calculation (min): 65 min    Short Term Goals: Week 1:  OT Short Term Goal 1 (Week 1): Pt will complete bathing EOB with no more than 1 rest break OT Short Term Goal 2 (Week 1): Pt will don B socks with CGA OT Short Term Goal 3 (Week 1): Pt will transfer to [facility] with CGA OT Short Term Goal 4 (Week 1): Pt will don pants with CGA  Skilled Therapeutic Interventions/Progress Updates:  Session focused on functional activity tolerance and dynamic standing balance. Pt received supine with c/o fatigue, no pain. Pt completed stand pivot to w/c with mod A, vc for UE placement. Pt stood with RW and completed functional reaching posteriorly toward the R and L with unilateral support on the RW. Edu and cueing provided for body mechanics. Pt unable to maintain neutral pelvic tilt sitting EOM, so a slight wedge was placed underneath pt while sitting to promote neutral/anterior pelvic tilt and trunk extension. While in this position pt held a 3lb dowel and completed B UE reaching to promote muscle strengthening/conditioning. Pt returned to w/c and was returned to room with family present. Family edu on unit's fall prevention policy.   Therapy Documentation Precautions:  Precautions Precautions: Fall Precaution Comments: Pt is blind, can seen peripherally on the L slightly Restrictions Weight Bearing Restrictions: No General: General Chart Reviewed: Yes Family/Caregiver Present: Yes Vital Signs: Therapy Vitals BP: (!) 124/57 Patient Position (if appropriate): Lying Pain: Pain Assessment Pain Scale: 0-10 Pain Score: 0-No pain   Therapy/Group: Individual Therapy  Curtis Sites 02/25/2018, 12:53 PM

## 2018-02-25 NOTE — Progress Notes (Addendum)
ANTICOAGULATION CONSULT NOTE - Follow-up Consult  Pharmacy Consult for warfarin Indication: atrial fibrillation  Allergies  Allergen Reactions  . Penicillins Swelling and Rash    Has patient had a PCN reaction causing immediate rash, facial/tongue/throat swelling, SOB or lightheadedness with hypotension: Yes Has patient had a PCN reaction causing severe rash involving mucus membranes or skin necrosis: No Has patient had a PCN reaction that required hospitalization: No Has patient had a PCN reaction occurring within the last 10 years: No If all of the above answers are "NO", then may proceed with Cephalosporin use.   . Atorvastatin     MYALGIA   . Codeine Nausea Only  . Tramadol Nausea Only    Patient Measurements: Height: 5\' 10"  (177.8 cm) Weight: 189 lb 13.1 oz (86.1 kg) IBW/kg (Calculated) : 73  Vital Signs: Temp: 97.8 F (36.6 C) (11/30 0525) BP: 118/54 (11/30 0525) Pulse Rate: 65 (11/30 0525)  Labs: Recent Labs    02/24/18 0812 02/24/18 0813 02/24/18 1530 02/24/18 1535 02/25/18 0619  HGB 9.9*  --   --   --  9.6*  HCT 32.7*  --   --   --  31.0*  PLT 214  --   --   --  184  LABPROT  --   --   --  16.4* 16.3*  INR  --   --   --  1.33 1.33  CREATININE  --  8.28* 4.39*  --  5.64*    Estimated Creatinine Clearance: 11.3 mL/min (A) (by C-G formula based on SCr of 5.64 mg/dL (H)).  Assessment: Alexander [name] is a 77 year old male with T2DM, ESRD on MWF, Afib on chronic coumadin, CAD, CVA with loss of short-term memory, macular degeneration/glaucoma. Patient is now 8 days s/p bilateral nephrectomy, cystoprosatatectomy. PTA Warfarin dose is 2.5mg  MWF and 5mg  all other day.   Patient is on heparin 3000 units subcutaneously every 8 hours until INR >2.  Patient's INR is 1.33 today, no change from yesterday. Hgb 9.6, platelets 184; both stable.  Will redose Warfarin tonight and continue subq heparin until INR therapeutic.  Spoke with nurse and confirmed no signs or  symptoms of bleeding.  Goal of Therapy:  INR 2-3 Monitor platelets by anticoagulation protocol: Yes   Plan:  Warfarin 5 mg x  1 Continue heparin sq until INR > 2 Daily INR  Thank you for allowing pharmacy to be a part of this patient's care.  [name], PharmD 02/25/2018 8:33 AM PGY-1 Pharmacy Resident Direct Phone: [phone] Please check [email or web address] for unit-specific pharmacist phone numbers

## 2018-02-26 ENCOUNTER — Inpatient Hospital Stay (HOSPITAL_COMMUNITY): Payer: Medicare Other | Admitting: Physical Therapy

## 2018-02-26 LAB — PROTIME-INR
INR: 1.79
Prothrombin Time: 20.6 seconds — ABNORMAL HIGH (ref 11.4–15.2)

## 2018-02-26 LAB — CBC
HCT: 30.2 % — ABNORMAL LOW (ref 39.0–52.0)
Hemoglobin: 9.3 g/dL — ABNORMAL LOW (ref 13.0–17.0)
MCH: 29.1 pg (ref 26.0–34.0)
MCHC: 30.8 g/dL (ref 30.0–36.0)
MCV: 94.4 fL (ref 80.0–100.0)
PLATELETS: 178 10*3/uL (ref 150–400)
RBC: 3.2 MIL/uL — ABNORMAL LOW (ref 4.22–5.81)
RDW: 18.9 % — ABNORMAL HIGH (ref 11.5–15.5)
WBC: 9.9 10*3/uL (ref 4.0–10.5)
nRBC: 0 % (ref 0.0–0.2)

## 2018-02-26 MED ORDER — WARFARIN SODIUM 5 MG PO TABS
5.0000 mg | ORAL_TABLET | Freq: Once | ORAL | Status: AC
  Administered 2018-02-26: 5 mg via ORAL
  Filled 2018-02-26: qty 1

## 2018-02-26 NOTE — Progress Notes (Signed)
Subjective: Patient denies any complaints.  It is interesting that he does not remember his therapy sessions yesterday.  He denies any pain.  Objective: BP (!) 105/58 (BP Location: Right Arm)   Pulse 66   Temp 97.9 F (36.6 C) (Oral)   Resp 18   Ht 5\' 10"  (1.778 m)   Wt 87.1 kg   SpO2 97%   BMI 27.55 kg/m   elderly well-nourished male in no acute distress. HEENT exam atraumatic, normocephalic, neck supple without jugular venous distention. Chest clear to auscultation cardiac exam S1-S2 are IRregular, 2/6 holosystolic murmur at the apex.. Abdominal exam overweight with bowel sounds, soft and nontender. Extremities no edema. Neurologic exam is alert  Assessment and plan: 1.  Deficits with mobility, transfers, self-care secondary to debility status post extensive urological procedures 2.  DVT Prophylaxis/Anticoagulation: Mechanical: Sequential compression devices, below knee Bilateral lower extremities 3. Chronic pain/Pain Management: Continue gabapentin. Controlled by tylenol prn 4. Mood: LCSW to follow for evaluation and support.  5. Neuropsych: This patient is not fully capable of making decisions on his own behalf. 6. Skin/Wound Care: Routine pressure relief measures. Monitor wound daily for resolution of drainage.  7. Fluids/Electrolytes/Nutrition: Routine labs with HD. Renal diet with 1200 cc FR.  8. Bladder cancer s/p resection: Continue to monitor wound for healing. GU following for input.  9. ESRD: HD MWF at the end of the day to help with tolerance of therapy.  10.  CAD s/p PPM for bradycardia: Continue to monitor BP/HR bid.  11.  Anemia of chronic disease:  H/H stable post 2 units on 11/26. Continue to monitor with serial check on HD days.  12.  Chronic A Fib: Heart rate controlled on lanoxin daily. Off metoprolol and diltiazem at this time. Resume coumadin once drainage resolves.  13. H/o Emphysema: Continue prednisone --resume prn inhaler. Has been weaned off oxygen--encourage  IS as continues to require supplemental oxygen.  14. 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  50. H/o CVA: Loss of STM and vision.

## 2018-02-26 NOTE — Progress Notes (Addendum)
ANTICOAGULATION CONSULT NOTE - Follow-up Consult  Pharmacy Consult for warfarin Indication: atrial fibrillation  Allergies  Allergen Reactions  . Penicillins Swelling and Rash    Has patient had a PCN reaction causing immediate rash, facial/tongue/throat swelling, SOB or lightheadedness with hypotension: Yes Has patient had a PCN reaction causing severe rash involving mucus membranes or skin necrosis: No Has patient had a PCN reaction that required hospitalization: No Has patient had a PCN reaction occurring within the last 10 years: No If all of the above answers are "NO", then may proceed with Cephalosporin use.   . Atorvastatin     MYALGIA   . Codeine Nausea Only  . Tramadol Nausea Only    Patient Measurements: Height: 5\' 10"  (177.8 cm) Weight: 192 lb 0.3 oz (87.1 kg) IBW/kg (Calculated) : 73  Vital Signs: Temp: 97.9 F (36.6 C) (12/01 0430) Temp Source: Oral (12/01 0430) BP: 105/58 (12/01 0430) Pulse Rate: 66 (12/01 0430)  Labs: Recent Labs    02/24/18 0812 02/24/18 0813 02/24/18 1530 02/24/18 1535 02/25/18 0619 02/26/18 0532  HGB 9.9*  --   --   --  9.6* 9.3*  HCT 32.7*  --   --   --  31.0* 30.2*  PLT 214  --   --   --  184 178  LABPROT  --   --   --  16.4* 16.3* 20.6*  INR  --   --   --  1.33 1.33 1.79  CREATININE  --  8.28* 4.39*  --  5.64*  --     Estimated Creatinine Clearance: 11.3 mL/min (A) (by C-G formula based on SCr of 5.64 mg/dL (H)).  Assessment: Alexander [name] is a 77 year old male with T2DM, ESRD on MWF, Afib on chronic coumadin, CAD, CVA with loss of short-term memory, macular degeneration/glaucoma. Patient is now 9 days s/p bilateral nephrectomy, cystoprosatatectomy.  PTA Warfarin dose is 2.5mg  MWF and 5mg  all other day.   Patient is on heparin 3000 units subcutaneously every 8 hours until INR >2.  Patient's INR is 1.79 today, up from 1.33 yesterday. Hgb 9.3, platelets 178; both stable.  Will redose Warfarin tonight and continue subq  heparin until INR therapeutic.  No signs or symptoms of bleeding noted.  Goal of Therapy:  INR 2-3 Monitor platelets by anticoagulation protocol: Yes   Plan:  Warfarin 5 mg x  1 Continue heparin sq until INR > 2 Daily INR  Thank you for allowing pharmacy to be a part of this patient's care.  [name], PharmD 02/26/2018 8:17 AM PGY-1 Pharmacy Resident Direct Phone: [phone] Please check [email or web address] for unit-specific pharmacist phone numbers

## 2018-02-26 NOTE — Progress Notes (Signed)
Physical Therapy Session Note  Patient Details  Name: Alexander [name] MRN: [id number] Date of Birth: 04-19-1940  Today's Date: 02/26/2018 PT Individual Time: 0800-0900 PT Individual Time Calculation (min): 60 min   Short Term Goals: Week 1:  PT Short Term Goal 1 (Week 1): Pt will complete transfers with min A consistently PT Short Term Goal 2 (Week 1): Pt will ambulate x 50 ft with LRAD and min A PT Short Term Goal 3 (Week 1): Pt will ascend/descend one 6" step with mod A  Skilled Therapeutic Interventions/Progress Updates:   Pt in supine and agreeable to therapy, no c/o pain. Session focused on functional mobility and independence w/ self-care tasks. Transferred to EOB and provided set-up assist and min tactile and verbal cues to take a few bites of breakfast, did not want anything further to eat. Pt requesting to shower. Covered suture sites w/ waterproof bandage and used w/c to transfer to/from bathroom. Min assist for all stand pivot transfers, x1 w/ RW and x2 using grabs bars to get to/from tub bench. Supervision assist for bathing while seated and CGA for standing w/ verbal cues for balance strategies while washing back and bottom. Total assist to thread LE garments, CGA to pull over hips, supervision to don shirt. Extra verbal and tactile cues needed throughout session 2/2 blindness. Ended session seated in w/c and in care of wife, all needs met.   Therapy Documentation Precautions:  Precautions Precautions: Fall Precaution Comments: Pt is blind, can seen peripherally on the L slightly Restrictions Weight Bearing Restrictions: No  Therapy/Group: Individual Therapy  [name] [name] 02/26/2018, 9:00 AM

## 2018-02-26 NOTE — Plan of Care (Signed)
  Problem: Consults Goal: RH GENERAL PATIENT EDUCATION Description See Patient Education module for education specifics. Outcome: Progressing Goal: Skin Care Protocol Initiated - if Braden Score 18 or less Description If consults are not indicated, leave blank or document N/A Outcome: Progressing Goal: Diabetes Guidelines if Diabetic/Glucose > 140 Description If diabetic or lab glucose is > 140 mg/dl - Initiate Diabetes/Hyperglycemia Guidelines & Document Interventions  Outcome: Progressing   Problem: RH BOWEL ELIMINATION Goal: RH STG MANAGE BOWEL WITH ASSISTANCE Description STG Manage Bowel with moderate Assistance.  Outcome: Progressing Goal: RH STG MANAGE BOWEL W/MEDICATION W/ASSISTANCE Description STG Manage Bowel with Medication and with moderate Assistance.  Outcome: Progressing   Problem: RH SKIN INTEGRITY Goal: RH STG SKIN FREE OF INFECTION/BREAKDOWN Description Skin to remain free from infection and breakdown while on rehab with moderate assistance and using available skin care resources.  Outcome: Progressing   Problem: RH SAFETY Goal: RH STG ADHERE TO SAFETY PRECAUTIONS W/ASSISTANCE/DEVICE Description STG Adhere to Safety Precautions With min to mod Assistance and using appropriate assistive Device.  Outcome: Progressing   Problem: RH PAIN MANAGEMENT Goal: RH STG PAIN MANAGED AT OR BELOW PT'S PAIN GOAL Description Less than 3 on a 1-10 pain scale.  Outcome: Progressing   Problem: RH KNOWLEDGE DEFICIT GENERAL Goal: RH STG INCREASE KNOWLEDGE OF SELF CARE AFTER HOSPITALIZATION Description Patient and family will demonstrate knowledge of how to care for at discharge concerning medication management, dietary and fluid restrictions, and follow-up care with the physician with min assist from rehab staff.  Outcome: Progressing

## 2018-02-26 NOTE — Progress Notes (Signed)
MD Burnice Logan notified of family's request to stop insulin checks. Patient's family reports patient has not been on any insulin or meds for diabetes, well controlled for many years. Patient's wife refused all insulin even if required. Order changed to CBG check daily.

## 2018-02-27 ENCOUNTER — Inpatient Hospital Stay (HOSPITAL_COMMUNITY): Payer: Medicare Other | Admitting: Occupational Therapy

## 2018-02-27 ENCOUNTER — Inpatient Hospital Stay (HOSPITAL_COMMUNITY): Payer: Medicare Other | Admitting: Physical Therapy

## 2018-02-27 DIAGNOSIS — N186 End stage renal disease: Secondary | ICD-10-CM

## 2018-02-27 DIAGNOSIS — J438 Other emphysema: Secondary | ICD-10-CM

## 2018-02-27 DIAGNOSIS — Z992 Dependence on renal dialysis: Secondary | ICD-10-CM

## 2018-02-27 DIAGNOSIS — D638 Anemia in other chronic diseases classified elsewhere: Secondary | ICD-10-CM

## 2018-02-27 LAB — PROTIME-INR
INR: 3.08
Prothrombin Time: 31.3 seconds — ABNORMAL HIGH (ref 11.4–15.2)

## 2018-02-27 LAB — CBC
HCT: 30.1 % — ABNORMAL LOW (ref 39.0–52.0)
Hemoglobin: 9.1 g/dL — ABNORMAL LOW (ref 13.0–17.0)
MCH: 28.8 pg (ref 26.0–34.0)
MCHC: 30.2 g/dL (ref 30.0–36.0)
MCV: 95.3 fL (ref 80.0–100.0)
Platelets: 207 10*3/uL (ref 150–400)
RBC: 3.16 MIL/uL — ABNORMAL LOW (ref 4.22–5.81)
RDW: 18.6 % — ABNORMAL HIGH (ref 11.5–15.5)
WBC: 9.2 10*3/uL (ref 4.0–10.5)
nRBC: 0 % (ref 0.0–0.2)

## 2018-02-27 LAB — RENAL FUNCTION PANEL
Albumin: 2.2 g/dL — ABNORMAL LOW (ref 3.5–5.0)
Anion gap: 18 — ABNORMAL HIGH (ref 5–15)
BUN: 114 mg/dL — ABNORMAL HIGH (ref 8–23)
CO2: 20 mmol/L — ABNORMAL LOW (ref 22–32)
Calcium: 7.3 mg/dL — ABNORMAL LOW (ref 8.9–10.3)
Chloride: 92 mmol/L — ABNORMAL LOW (ref 98–111)
Creatinine, Ser: 8.99 mg/dL — ABNORMAL HIGH (ref 0.61–1.24)
GFR calc Af Amer: 6 mL/min — ABNORMAL LOW (ref >60)
GFR calc non Af Amer: 5 mL/min — ABNORMAL LOW (ref >60)
Glucose, Bld: 112 mg/dL — ABNORMAL HIGH (ref 70–99)
Phosphorus: 6.9 mg/dL — ABNORMAL HIGH (ref 2.5–4.6)
Potassium: 4.5 mmol/L (ref 3.5–5.1)
Sodium: 130 mmol/L — ABNORMAL LOW (ref 135–145)

## 2018-02-27 LAB — GLUCOSE, CAPILLARY
Glucose-Capillary: 108 mg/dL — ABNORMAL HIGH (ref 70–99)
Glucose-Capillary: 103 mg/dL — ABNORMAL HIGH (ref 70–99)
Glucose-Capillary: 97 mg/dL (ref 70–99)

## 2018-02-27 MED ORDER — SODIUM CHLORIDE 0.9 % IV SOLN: 100.0000 mL | INTRAVENOUS | Status: DC | PRN

## 2018-02-27 MED ORDER — WARFARIN SODIUM 2.5 MG PO TABS
2.5000 mg | ORAL_TABLET | Freq: Once | ORAL | Status: AC
  Administered 2018-02-27: 2.5 mg via ORAL
  Filled 2018-02-27: qty 1

## 2018-02-27 MED ORDER — LIDOCAINE HCL (PF) 1 % IJ SOLN: 5.0000 mL | INTRAMUSCULAR | Status: DC | PRN

## 2018-02-27 MED ORDER — DARBEPOETIN ALFA 150 MCG/0.3ML IJ SOSY
PREFILLED_SYRINGE | INTRAMUSCULAR | Status: AC
  Administered 2018-02-27: 150 ug via INTRAVENOUS
  Filled 2018-02-27: qty 0.3

## 2018-02-27 MED ORDER — ALTEPLASE 2 MG IJ SOLR: 2.0000 mg | Freq: Once | INTRAMUSCULAR | Status: DC | PRN

## 2018-02-27 MED ORDER — PENTAFLUOROPROP-TETRAFLUOROETH EX AERO: 1.0000 "application " | INHALATION_SPRAY | CUTANEOUS | Status: DC | PRN

## 2018-02-27 MED ORDER — HEPARIN SODIUM (PORCINE) 1000 UNIT/ML DIALYSIS: 1000.0000 [IU] | INTRAMUSCULAR | Status: DC | PRN

## 2018-02-27 MED ORDER — HEPARIN SODIUM (PORCINE) 1000 UNIT/ML DIALYSIS: 20.0000 [IU]/kg | INTRAMUSCULAR | Status: DC | PRN

## 2018-02-27 MED ORDER — DOXERCALCIFEROL 4 MCG/2ML IV SOLN
INTRAVENOUS | Status: AC
  Filled 2018-02-27: qty 2

## 2018-02-27 MED ORDER — LIDOCAINE-PRILOCAINE 2.5-2.5 % EX CREA: 1.0000 "application " | TOPICAL_CREAM | CUTANEOUS | Status: DC | PRN

## 2018-02-27 NOTE — Care Management (Signed)
[facility] Individual Statement of Services  Patient Name:  Alexander [name]  Date:  02/27/2018  Welcome to the [facility].  Our goal is to provide you with an individualized program based on your diagnosis and situation, designed to meet your specific needs.  With this comprehensive rehabilitation program, you will be expected to participate in at least 3 hours of rehabilitation therapies Monday-Friday, with modified therapy programming on the weekends.  Your rehabilitation program will include the following services:  Physical Therapy (PT), Occupational Therapy (OT), 24 hour per day rehabilitation nursing, Therapeutic Recreaction (TR), Neuropsychology, Case Management (Social Worker), Rehabilitation Medicine, Nutrition Services and Pharmacy Services  Weekly team conferences will be held on Tuesdays to discuss your progress.  Your Social Worker will talk with you frequently to get your input and to update you on team discussions.  Team conferences with you and your family in attendance may also be held.  Expected length of stay: 10-14 days   Overall anticipated outcome: supervision  Depending on your progress and recovery, your program may change. Your Social Worker will coordinate services and will keep you informed of any changes. Your Social Worker's name and contact numbers are listed  below.  The following services may also be recommended but are not provided by the [facility] will be made to provide these services after discharge if needed.  Arrangements include referral to agencies that provide these services.  Your insurance has been verified to be:  Medicare, [place] Your primary doctor is:  Dr. Shelia Media  Pertinent information will be shared with your doctor and your insurance company.  Social Worker:  [place], [place] or ([phone]   Information discussed with and copy given to patient by: Lennart Pall, 02/27/2018, 1:31 PM

## 2018-02-27 NOTE — Progress Notes (Signed)
Occupational Therapy Session Note  Patient Details  Name: Alexander [name] MRN: [id number] Date of Birth: 12-Apr-1940  Today's Date: 02/27/2018 OT Individual Time: 1100-1135 OT Individual Time Calculation (min): 35 min    Short Term Goals: Week 1:  OT Short Term Goal 1 (Week 1): Pt will complete bathing EOB with no more than 1 rest break OT Short Term Goal 2 (Week 1): Pt will don B socks with CGA OT Short Term Goal 3 (Week 1): Pt will transfer to [facility] with CGA OT Short Term Goal 4 (Week 1): Pt will don pants with CGA  Skilled Therapeutic Interventions/Progress Updates:    Pt with limited OT session secondary to lethargy and decreased BP.  Pt not oriented to place when asked.  He was able to state "yes" when asked if he was in a hospital.  He could not state city or the city he lives in (both [place]).  His wife reported that he had been having trouble with his memory some since his CVA, but today he looked really fatigued.  Noted pt already had completed over two hours of therapy prior to this visit.  Took pt down to the therapy gym with stand pivot transfer to the therapy mat with mod instructional cueing for sequencing and min assist.  Pt sits on the mat with posterior pelvic tilt with head flexion.  Noted twitching at times and falling somewhat asleep.  Completed sit to stand with mod assist and not assistive device.  He was able to maintain standing for one minute, but exhibited bilateral knee and trunk flexion.  Upon sitting down pt reporting feeling really tired.  BP taken X2 in sitting at 87/56 and 86/51.  Pt laid down on the mat with BP again at 84/50.  Had pt transfer back to the wheelchair with mod assist and no assistive device.  Therapist took him back to the room, with nursing made aware on the way.  He then transferred back to the bed.  Wife present throughout session as well.  Bed alarm in place with call button and phone in reach.     Therapy Documentation Precautions:   Precautions Precautions: Fall Precaution Comments: Pt is blind, can seen peripherally on the L slightly Restrictions Weight Bearing Restrictions: No General: General OT Amount of Missed Time: 25 Minutes Vital Signs: Therapy Vitals BP: (!) 86/51 Patient Position (if appropriate): Sitting Pain: Pain Assessment Pain Scale: Faces Pain Score: 0-No pain Faces Pain Scale: Hurts a little bit Pain Type: Acute pain Pain Location: Hip Pain Orientation: Left Pain Descriptors / Indicators: Discomfort Pain Onset: With Activity Pain Intervention(s): Repositioned;Emotional support  Therapy/Group: Individual Therapy  Navdeep Halt OTR/L 02/27/2018, 12:32 PM

## 2018-02-27 NOTE — Progress Notes (Signed)
[name] Progress Note   Subjective:  Seen in room, sleepy after therapy this morning. No CP/dyspnea. Wife reports that he had some L leg pains this morning, now resolved.    Objective Vitals:   02/26/18 1400 02/26/18 1926 02/27/18 0428 02/27/18 0513  BP: 132/69 (!) 107/59  106/62  Pulse: 66 62  66  Resp: 18 19  17   Temp: 98 F (36.7 C) (!) 97.5 F (36.4 C)  97.7 F (36.5 C)  TempSrc:      SpO2: 100% 100%  94%  Weight:   87.8 kg   Height:       Physical Exam General: Elderly man, NAD Heart: RRR; no murmur Lungs: CTAB Abdomen: soft, multiple healing incisions Extremities: No LE edema Dialysis Access: L AVF + thrill  Additional Objective Labs: Basic Metabolic Panel: Recent Labs  Lab 02/24/18 0813 02/24/18 1530 02/25/18 0619  NA 131* 134* 131*  K 4.0 3.8 4.0  CL 94* 97* 91*  CO2 23 28 26   GLUCOSE 132* 111* 117*  BUN 67* 29* 42*  CREATININE 8.28* 4.39* 5.64*  CALCIUM 7.8* 7.9* 7.9*  PHOS 7.0*  --   --    Liver Function Tests: Recent Labs  Lab 02/24/18 0813 02/25/18 0619  AST  --  24  ALT  --  13  ALKPHOS  --  61  BILITOT  --  1.2  PROT  --  5.0*  ALBUMIN 2.4* 2.2*   CBC: Recent Labs  Lab 02/24/18 0812 02/25/18 0619 02/26/18 0532  WBC 10.8* 10.4 9.9  NEUTROABS  --  8.9*  --   HGB 9.9* 9.6* 9.3*  HCT 32.7* 31.0* 30.2*  MCV 95.6 94.8 94.4  PLT 214 184 178   CBG: Recent Labs  Lab 02/25/18 0700 02/25/18 1212 02/25/18 1629 02/25/18 2141 02/27/18 0628  GLUCAP 118* 105* 107* 97 103*   Medications:  . bromocriptine  5 mg Oral QODAY  . Chlorhexidine Gluconate Cloth  6 each Topical Q0600  . Chlorhexidine Gluconate Cloth  6 each Topical Q0600  . cinacalcet  60 mg Oral Q supper  . darbepoetin (ARANESP) injection - DIALYSIS  150 mcg Intravenous Q Mon-HD  . digoxin  62.5 mcg Oral Daily  . docusate sodium  100 mg Oral BID  . dorzolamide-timolol  1 drop Both Eyes BID  . doxercalciferol  3 mcg Intravenous Q M,W,F-HD  . feeding  supplement (PRO-STAT SUGAR FREE 64)  30 mL Oral BID  . gabapentin  200 mg Oral QHS  . latanoprost  1 drop Both Eyes QHS  . multivitamin  1 tablet Oral QHS  . pantoprazole  40 mg Oral Daily  . polyethylene glycol  17 g Oral Daily  . pravastatin  10 mg Oral Daily  . predniSONE  5 mg Oral QHS  . senna-docusate  2 tablet Oral QHS  . sertraline  50 mg Oral Daily  . sevelamer carbonate  1,600 mg Oral TID WC  . warfarin  2.5 mg Oral ONCE-1800  . Warfarin - Pharmacist Dosing Inpatient   Does not apply q1800    Dialysis Orders: MWF NW 4h 86.5kg 2/2 bath Hep 2800 Qb 450 L RC AVF - Hectoral 50mcg IV q HD - Mircera 150 q 2wks (last given 11/20)  Assessment/Plan: 1. Bladder cancer: S/pcystoprostatectomy + bilateral nephrectomy 11/22, path showing "infiltrative high grade urothelial carcinoma with micropapillary and mucinous differentiation involving entire bladder"; resected LNs all benign, ureters/kidneys without cancerous changes. Per urology. 2. ESRD:Continue HD per usual MWF  schedule, for HD today. 3. Anemia due to CKD + ABL: S/p 2U PRBCs 11/26, 1U on 11/23. Hgb 9.3, resuming Aranesp today. 4. MBD-CKD: Resumed phosphorus binders/sensipar and Hectorol with dialysis. 5. Afib, Hx PPM: HR stable. Was on heparin bridge and warfarin, now warfarin alone. INR 3. 6. Debility: Admitted to CIR on 11/29 7. Blindness 8. Hx TAVR 9. CAD: heart cath 2018 minimal CAD < 30% stenosis 10. Nutrition: Alb low, continue supplements and liberalized diet. 11. HTN/volume: prior EDW still seems appropriate.   Veneta Penton, PA-C 02/27/2018, 11:34 AM  [facility] Pager: [phone]

## 2018-02-27 NOTE — Progress Notes (Addendum)
Physical Therapy Session Note  Patient Details  Name: Alexander [name] MRN: [id number] Date of Birth: [birth year]  Today's Date: 02/27/2018 PT Individual Time: 0730-0825 and [id number]  PT Individual Time Calculation (min): 55 min and 27 min  Short Term Goals: Week 1:  PT Short Term Goal 1 (Week 1): Pt will complete transfers with min A consistently PT Short Term Goal 2 (Week 1): Pt will ambulate x 50 ft with LRAD and min A PT Short Term Goal 3 (Week 1): Pt will ascend/descend one 6" step with mod A  Skilled Therapeutic Interventions/Progress Updates:    pt performs bed mobility with supervision, CGA for scooting to edge of bed.  Stand pivot transfers throughout session with RW with min A.  Gait training with RW 30' x 2 with min A, forward flexed posture and knees flexed, improves briefly with cuing but limited by fatigue. Sit <> stand repetitions for LE strengthening with and without RW with min A.  Standing wt shifts and fwd/bkwd stepping without RW with min/mod A.  nustep for LE/UE endurance and strengthening level 4 x 8 minutes.  Pt left in room with alarm set, RN present.  Session 2: Pt c/o Lt hip pain with exercises, eases with rest, pt refuses offer of ice.  Seated therex 2 x 10 for bilat LE strengthening with hip flexion, LAQ, HS curl, hip abd/add and add squeeze.  UE therex with 5# bar for shoulder and elbow strengthening. Pt left in w/c with needs at hand, wife present.   Therapy Documentation Precautions:  Precautions Precautions: Fall Precaution Comments: Pt is blind, can seen peripherally on the L slightly Restrictions Weight Bearing Restrictions: No Pain:  no c/o pain   Therapy/Group: Individual Therapy  , 02/27/2018, 8:26 AM

## 2018-02-27 NOTE — Progress Notes (Signed)
Alexander [name] PHYSICAL MEDICINE & REHABILITATION PROGRESS NOTE   Subjective/Complaints:    Objective:   No results found. Recent Labs    02/25/18 0619 02/26/18 0532  WBC 10.4 9.9  HGB 9.6* 9.3*  HCT 31.0* 30.2*  PLT 184 178   Recent Labs    02/24/18 1530 02/25/18 0619  NA 134* 131*  K 3.8 4.0  CL 97* 91*  CO2 28 26  GLUCOSE 111* 117*  BUN 29* 42*  CREATININE 4.39* 5.64*  CALCIUM 7.9* 7.9*    Intake/Output Summary (Last 24 hours) at 02/27/2018 0841 Last data filed at 02/26/2018 1823 Gross per 24 hour  Intake 720 ml  Output -  Net 720 ml     Physical Exam: Vital Signs Blood pressure 106/62, pulse 66, temperature 97.7 F (36.5 C), resp. rate 17, height 5\' 10"  (1.778 m), weight 87.8 kg, SpO2 94 %. Constitutional: No distress . Vital signs reviewed. HEENT: EOMI, oral membranes moist Neck: supple Cardiovascular: IRR with murmur. No JVD    Respiratory: CTA Bilaterally without wheezes or rales. Normal effort    GI: BS +, non-tender, non-distended, abdominal scars Musculoskeletal: He exhibits edema (left thigh).  Edema and tenderness left flank  Neurological: He is alert and oriented to person, place, and time.  Follow simple commands Oriented x2 Motor: Right upper extremity/right lower extremity: 5/5 proximal distal Left upper extremity: 4+/5 proximal distal--stable Left lower extremity: Hip flexion 3+/5, knee extension 2+/5, ankle dorsiflexion 4/5 Right lower extremity: Hip flexion 4-/5, knee extension 3/5, ankle dorsiflexion 4+/5 Sensation diminished light touch left upper and left lower extremity--stable Skin: Skin is warm and dry.  Serosanguineous drainage much improved at puncture site   Diffuse ecchymosis left flank down to left lateral thigh and up to mid back. Ecchymosis along  right flank.   Multiple small incision abdomen and midline incision C/D/I, scarred  Psychiatric:pleasant, cooperative. folllows all commands   Assessment/Plan: 1. Functional  deficits secondary to debility which require 3+ hours per day of interdisciplinary therapy in a comprehensive inpatient rehab setting.  Physiatrist is providing close team supervision and 24 hour management of active medical problems listed below.  Physiatrist and rehab team continue to assess barriers to discharge/monitor patient progress toward functional and medical goals  Care Tool:  Bathing  Bathing activity did not occur: Safety/medical concerns(first day admission) Body parts bathed by patient: Right arm, Left arm, Chest, Abdomen, Front perineal area, Right upper leg, Buttocks, Left lower leg, Face, Left upper leg, Right lower leg         Bathing assist Assist Level: Minimal Assistance - Patient > 75%     Upper Body Dressing/Undressing Upper body dressing Upper body dressing/undressing activity did not occur (including orthotics): N/A What is the patient wearing?: Pull over shirt    Upper body assist Assist Level: Contact Guard/Touching assist    Lower Body Dressing/Undressing Lower body dressing    Lower body dressing activity did not occur: N/A What is the patient wearing?: Underwear/pull up, Pants     Lower body assist Assist for lower body dressing: Minimal Assistance - Patient > 75%     Toileting Toileting Toileting Activity did not occur (Clothing management and hygiene only): N/A (no void or bm)  Toileting assist Assist for toileting: Moderate Assistance - Patient 50 - 74%     Transfers Chair/bed transfer  Transfers assist  Chair/bed transfer activity did not occur: Safety/medical concerns  Chair/bed transfer assist level: Minimal Assistance - Patient > 75%     Locomotion  Ambulation   Ambulation assist      Assist level: Minimal Assistance - Patient > 75% Assistive device: Walker-rolling Max distance: 30   Walk 10 feet activity   Assist     Assist level: Minimal Assistance - Patient > 75% Assistive device: Walker-rolling   Walk 50  feet activity   Assist Walk 50 feet with 2 turns activity did not occur: Safety/medical concerns         Walk 150 feet activity   Assist Walk 150 feet activity did not occur: Safety/medical concerns         Walk 10 feet on uneven surface  activity   Assist Walk 10 feet on uneven surfaces activity did not occur: Safety/medical concerns         Wheelchair     Assist Will patient use wheelchair at discharge?: No             Wheelchair 50 feet with 2 turns activity    Assist            Wheelchair 150 feet activity     Assist           Medical Problem List and Plan: 1.  Deficits with mobility, transfers, self-care secondary to debility status post extensive urological procedures  -continue therapies 2.  DVT Prophylaxis/Anticoagulation: Mechanical: Sequential compression devices, below knee Bilateral lower extremities 3. Chronic pain/Pain Management: Continue gabapentin. Controlled by tylenol prn 4. Mood: LCSW to follow for evaluation and support.  5. Neuropsych: This patient is not fully capable of making decisions on his own behalf. 6. Skin/Wound Care: Routine pressure relief measures. Monitor wound daily for resolution of drainage.  7. Fluids/Electrolytes/Nutrition: Routine labs with HD. Renal diet with 1200 cc FR.  8. Bladder cancer s/p resection: Continue to monitor wound for healing. GU following for input.   9. ESRD: HD MWF at the end of the day to help with tolerance of therapy.   -daily weights 10.  CAD s/p PPM for bradycardia: Continue to monitor BP/HR bid.  11.  Anemia of chronic disease:  H/H stable post 2 units on 11/26. Continue to monitor with serial check on HD days.  12.  Chronic A Fib: Heart rate controlled on lanoxin daily. Off metoprolol and diltiazem at this time. Resume coumadin once drainage resolves.  13. H/o Emphysema: Continue prednisone --resume prn inhaler. Has been weaned off oxygen--encourage IS as continues to  require supplemental oxygen.   -tolerating activities with therapy 14. 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  10. H/o CVA: Loss of STM and vision.     LOS: 3 days A FACE TO FACE EVALUATION WAS PERFORMED  Meredith Staggers 02/27/2018, 8:41 AM

## 2018-02-27 NOTE — Progress Notes (Signed)
ANTICOAGULATION CONSULT NOTE - Follow-up Consult  Pharmacy Consult for warfarin Indication: atrial fibrillation  Allergies  Allergen Reactions  . Penicillins Swelling and Rash    Has patient had a PCN reaction causing immediate rash, facial/tongue/throat swelling, SOB or lightheadedness with hypotension: Yes Has patient had a PCN reaction causing severe rash involving mucus membranes or skin necrosis: No Has patient had a PCN reaction that required hospitalization: No Has patient had a PCN reaction occurring within the last 10 years: No If all of the above answers are "NO", then may proceed with Cephalosporin use.   . Atorvastatin     MYALGIA   . Codeine Nausea Only  . Tramadol Nausea Only    Patient Measurements: Height: 5\' 10"  (177.8 cm) Weight: 193 lb 9 oz (87.8 kg) IBW/kg (Calculated) : 73  Vital Signs: Temp: 97.7 F (36.5 C) (12/02 0513) BP: 106/62 (12/02 0513) Pulse Rate: 66 (12/02 0513)  Labs: Recent Labs    02/24/18 1530  02/25/18 0619 02/26/18 0532 02/27/18 0606  HGB  --   --  9.6* 9.3*  --   HCT  --   --  31.0* 30.2*  --   PLT  --   --  184 178  --   LABPROT  --    < > 16.3* 20.6* 31.3*  INR  --    < > 1.33 1.79 3.08  CREATININE 4.39*  --  5.64*  --   --    < > = values in this interval not displayed.    Estimated Creatinine Clearance: 12.2 mL/min (A) (by C-G formula based on SCr of 5.64 mg/dL (H)).  Assessment: 77 year old male with T2DM, ESRD on MWF, Afib on chronic coumadin, CAD, CVA with loss of short-term memory, macular degeneration/glaucoma. Patient is now 9 days s/p bilateral nephrectomy, cystoprosatatectomy.  PTA Warfarin dose is 2.5mg  MWF and 5mg  all other day.   INR today slightly above goal 3.08  Goal of Therapy:  INR 2-3 Monitor platelets by anticoagulation protocol: Yes   Plan:  Warfarin 2.5 mg x  1 DC sq heparin Daily INR  Levester Fresh, PharmD, BCPS, BCCCP Clinical Pharmacist [phone]  Please check AMION for all [place] numbers  02/27/2018 10:11 AM

## 2018-02-27 NOTE — Procedures (Signed)
Patient seen on Hemodialysis. QB 350, UF goal 1.5L Mildly hypotensive--UF goal revised.  Elmarie Shiley MD [facility]. Office # [phone] Pager # [phone] 2:57 PM

## 2018-02-27 NOTE — Progress Notes (Signed)
Occupational Therapy Session Note  Patient Details  Name: Alexander [name] MRN: [id number] Date of Birth: 02/08/1941  Today's Date: 02/27/2018 OT Individual Time: 0830-0930 OT Individual Time Calculation (min): 60 min    Short Term Goals: Week 1:  OT Short Term Goal 1 (Week 1): Pt will complete bathing EOB with no more than 1 rest break OT Short Term Goal 2 (Week 1): Pt will don B socks with CGA OT Short Term Goal 3 (Week 1): Pt will transfer to BSC with CGA OT Short Term Goal 4 (Week 1): Pt will don pants with CGA  Skilled Therapeutic Interventions/Progress Updates:    Pt received in w/c with starting his breakfast with NT.  Pt continued eating with breakfast with this OT with occasional cues on where to locate items but otherwise ate with S.   Pt's spouse arrived.  He did not want to shower as he did yesterday but agreeable to washing up UB and changing clothing.  Supervision/ set up with UB self care.  For LB self care pt able to doff/don pants, socks and shoes over feet by figure 4 crossing of ankle to knee. Pt able to tie his shoes. CGA when standing to pull pants over hips and to straighten out pants.  Pt initially tried to stand with one hand on Rw. Cued to push up with B hands and then pt was able to stand up with CGA to RW.  Pt ambulated 10 ft to bathroom. BSC placed over toilet to give pt more height and B bars to push up from. He was not able to void.  Pt was able to manage pants over hips with CGA.  Walking out of the bathroom pt was struggling a bit more due to fatigue and needed increased support at hips.   Pt given several rest breaks and educated pt and spouse on the importance of energy conservation. She stated he tends to rush through things at home.  Discussed and demonstrated safe RW placement as pt tends to push the RW slightly too far forward.  Pt resting in w/c with chair alarm belt on and all needs met. telesitter on.   Therapy Documentation Precautions:   Precautions Precautions: Fall Precaution Comments: Pt is blind, can seen peripherally on the L slightly Restrictions Weight Bearing Restrictions: No       Pain: Pain Assessment Pain Scale: 0-10 Pain Score: 0-No pain    Therapy/Group: Individual Therapy  , 02/27/2018, 12:07 PM  

## 2018-02-27 NOTE — Progress Notes (Signed)
Inpatient Rehabilitation  Patient information reviewed and entered into eRehab system by Loeta Herst M. Horrace Hanak, M.A., CCC/SLP, PPS Coordinator.  Information including medical coding, functional ability and quality indicators will be reviewed and updated through discharge.    Per nursing patient was given "Data Collection Information Summary" for Patients in Inpatient Rehabilitation Facilities with attached "Privacy Act Statement-Health Care Records" upon admission.   

## 2018-02-28 ENCOUNTER — Inpatient Hospital Stay (HOSPITAL_COMMUNITY): Payer: Medicare Other | Admitting: Physical Therapy

## 2018-02-28 ENCOUNTER — Inpatient Hospital Stay (HOSPITAL_COMMUNITY): Payer: Medicare Other

## 2018-02-28 ENCOUNTER — Inpatient Hospital Stay (HOSPITAL_COMMUNITY): Payer: Medicare Other | Admitting: Occupational Therapy

## 2018-02-28 LAB — PROTIME-INR
INR: 3.94
Prothrombin Time: 37.9 seconds — ABNORMAL HIGH (ref 11.4–15.2)

## 2018-02-28 LAB — GLUCOSE, CAPILLARY: Glucose-Capillary: 98 mg/dL (ref 70–99)

## 2018-02-28 MED ORDER — WARFARIN SODIUM 1 MG PO TABS
1.0000 mg | ORAL_TABLET | Freq: Once | ORAL | Status: AC
  Administered 2018-02-28: 1 mg via ORAL
  Filled 2018-02-28: qty 1

## 2018-02-28 MED ORDER — CHLORHEXIDINE GLUCONATE CLOTH 2 % EX PADS
6.0000 | MEDICATED_PAD | Freq: Every day | CUTANEOUS | Status: DC
  Administered 2018-02-28 – 2018-03-03 (×3): 6 via TOPICAL

## 2018-02-28 MED ORDER — ACETAMINOPHEN 325 MG PO TABS: 325.0000 mg | ORAL_TABLET | ORAL | Status: AC | PRN

## 2018-02-28 NOTE — Progress Notes (Signed)
ANTICOAGULATION CONSULT NOTE - Follow-up Consult  Pharmacy Consult for warfarin Indication: atrial fibrillation  Allergies  Allergen Reactions  . Penicillins Swelling and Rash    Has patient had a PCN reaction causing immediate rash, facial/tongue/throat swelling, SOB or lightheadedness with hypotension: Yes Has patient had a PCN reaction causing severe rash involving mucus membranes or skin necrosis: No Has patient had a PCN reaction that required hospitalization: No Has patient had a PCN reaction occurring within the last 10 years: No If all of the above answers are "NO", then may proceed with Cephalosporin use.   . Atorvastatin     MYALGIA   . Codeine Nausea Only  . Tramadol Nausea Only    Patient Measurements: Height: 5\' 10"  (177.8 cm) Weight: 192 lb 14.4 oz (87.5 kg) IBW/kg (Calculated) : 73  Vital Signs: Temp: 97.8 F (36.6 C) (12/03 0448) BP: 120/52 (12/03 0448) Pulse Rate: 61 (12/03 0448)  Labs: Recent Labs    02/26/18 0532 02/27/18 0606 02/27/18 1412 02/27/18 1413 02/28/18 0437  HGB 9.3*  --   --  9.1*  --   HCT 30.2*  --   --  30.1*  --   PLT 178  --   --  207  --   LABPROT 20.6* 31.3*  --   --  37.9*  INR 1.79 3.08  --   --  3.94  CREATININE  --   --  8.99*  --   --     Estimated Creatinine Clearance: 7.1 mL/min (A) (by C-G formula based on SCr of 8.99 mg/dL (H)).  Assessment: 77 year old male with T2DM, ESRD on MWF, Afib on chronic coumadin, CAD, CVA with loss of short-term memory, macular degeneration/glaucoma. Patient is now 9 days s/p bilateral nephrectomy, cystoprosatatectomy.  PTA Warfarin dose is 2.5mg  MWF and 5mg  all other day.   INR today high at 3.94  Goal of Therapy:  INR 2-3 Monitor platelets by anticoagulation protocol: Yes   Plan:  Warfarin 1 mg x  1 Daily INR  Levester Fresh, PharmD, BCPS, BCCCP Clinical Pharmacist [phone]  Please check AMION for all [place] numbers  02/28/2018 9:30 AM

## 2018-02-28 NOTE — Progress Notes (Signed)
Physical Therapy Session Note  Patient Details  Name: Alexander [name] MRN: [id number] Date of Birth: 1940-08-03  Today's Date: 02/28/2018 PT Individual Time: 1000-1045 PT Individual Time Calculation (min): 45 min   Short Term Goals: Week 1:  PT Short Term Goal 1 (Week 1): Pt will complete transfers with min A consistently PT Short Term Goal 2 (Week 1): Pt will ambulate x 50 ft with LRAD and min A PT Short Term Goal 3 (Week 1): Pt will ascend/descend one 6" step with mod A  Skilled Therapeutic Interventions/Progress Updates:    Pt supine in bed upon PT arrival, agreeable to therapy tx and denies pain. Pt transported to the dayroom and performed stand pivot transfer from w/c<>nustep with mod assist. Pt worked on global strengthening and endurance using nustep x 6 minutes on workload 5 this session. Pt transported to the gym and performed 2 x 5 sit<>stands with RW and min assist, working on LE strength. Pt ambulated x 57 ft and 36 ft this session with min assist and RW, fading to mod assist with increased distance/fatigue. Pt transported back to room and left seated with wife present, NT called for toileting.   Therapy Documentation Precautions:  Precautions Precautions: Fall Precaution Comments: Pt is blind, can seen peripherally on the L slightly Restrictions Weight Bearing Restrictions: No    Therapy/Group: Individual Therapy  Netta Corrigan, PT, DPT 02/28/2018, 8:01 AM

## 2018-02-28 NOTE — Progress Notes (Signed)
[facility] Progress Note   Subjective:  Seen in room. Feels ok today. No dyspnea/CP, L leg pain resolved. On exam, noted to have crepitus/subcutaneous emphysema of L leg extending from L mid calf up to upper lateral thigh, as well as R upper calf area. No erythema or tenderness. Discussed with Algis Liming, rehab PA who examined the patient with me and will contact surgeon and evaluate further.  Objective Vitals:   02/27/18 1756 02/27/18 2037 02/28/18 0448 02/28/18 0457  BP: (!) 114/54 (!) 134/116 (!) 120/52   Pulse: 61 83 61   Resp: 18 16 17    Temp: (!) 97.5 F (36.4 C) 97.7 F (36.5 C) 97.8 F (36.6 C)   TempSrc: Oral Oral    SpO2:  100% (!) 88%   Weight:    87.5 kg  Height:       Physical Exam General: Well appearing man, NAD. Sitting in wheelchair. Heart: RRR; no murmur Lungs: CTAB Abdomen: soft, multiple healing incisions and scattered bruising Extremities: NEW crepitus along L calf and tracking up lateral leg to mid thigh, as well as in R upper calf around knee Dialysis Access: L AVF + thrill  Additional Objective Labs: Basic Metabolic Panel: Recent Labs  Lab 02/24/18 0813 02/24/18 1530 02/25/18 0619 02/27/18 1412  NA 131* 134* 131* 130*  K 4.0 3.8 4.0 4.5  CL 94* 97* 91* 92*  CO2 23 28 26  20*  GLUCOSE 132* 111* 117* 112*  BUN 67* 29* 42* 114*  CREATININE 8.28* 4.39* 5.64* 8.99*  CALCIUM 7.8* 7.9* 7.9* 7.3*  PHOS 7.0*  --   --  6.9*   Liver Function Tests: Recent Labs  Lab 02/24/18 0813 02/25/18 0619 02/27/18 1412  AST  --  24  --   ALT  --  13  --   ALKPHOS  --  61  --   BILITOT  --  1.2  --   PROT  --  5.0*  --   ALBUMIN 2.4* 2.2* 2.2*   CBC: Recent Labs  Lab 02/24/18 0812 02/25/18 0619 02/26/18 0532 02/27/18 1413  WBC 10.8* 10.4 9.9 9.2  NEUTROABS  --  8.9*  --   --   HGB 9.9* 9.6* 9.3* 9.1*  HCT 32.7* 31.0* 30.2* 30.1*  MCV 95.6 94.8 94.4 95.3  PLT 214 184 178 207   CBG: Recent Labs  Lab 02/25/18 2141 02/26/18 0641  02/27/18 0628 02/27/18 2202 02/28/18 0624  GLUCAP 97 108* 103* 97 98   Medications:  . bromocriptine  5 mg Oral QODAY  . Chlorhexidine Gluconate Cloth  6 each Topical Q0600  . Chlorhexidine Gluconate Cloth  6 each Topical Q0600  . cinacalcet  60 mg Oral Q supper  . darbepoetin (ARANESP) injection - DIALYSIS  150 mcg Intravenous Q Mon-HD  . digoxin  62.5 mcg Oral Daily  . docusate sodium  100 mg Oral BID  . dorzolamide-timolol  1 drop Both Eyes BID  . doxercalciferol  3 mcg Intravenous Q M,W,F-HD  . feeding supplement (PRO-STAT SUGAR FREE 64)  30 mL Oral BID  . gabapentin  200 mg Oral QHS  . latanoprost  1 drop Both Eyes QHS  . multivitamin  1 tablet Oral QHS  . pantoprazole  40 mg Oral Daily  . polyethylene glycol  17 g Oral Daily  . pravastatin  10 mg Oral Daily  . predniSONE  5 mg Oral QHS  . senna-docusate  2 tablet Oral QHS  . sertraline  50 mg Oral  Daily  . sevelamer carbonate  1,600 mg Oral TID WC  . warfarin  1 mg Oral ONCE-1800  . Warfarin - Pharmacist Dosing Inpatient   Does not apply q1800    Dialysis Orders: MWF NW 4h 86.5kg 2/2 bath Hep 2800 Qb 450 L RC AVF - Hectoral 11mcg IV q HD - Mircera 150 q 2wks (last given 11/20)  Assessment/Plan: 1. Bladder cancer: S/pcystoprostatectomy + bilateral nephrectomy 11/22, path showing "infiltrative high grade urothelial carcinoma with micropapillary and mucinous differentiation involving entire bladder"; resected LNs all benign, ureters/kidneys without cancerous changes. Per urology. 2. New BLE crepitus/subcutaneous emphysema: Rehab provider aware, surgery input pending. Pt does not appear toxic in anyway, perfusing legs, no pain --but would recommend getting abd/pelvic and B LE CT to evaluate. 3. ESRD:Continue HD per usual MWF schedule, next 12/4. 4. Anemia due to CKD + ABL: S/p 2U PRBCs 11/26, 1U on 11/23. Hgb 9.1, Aranesp q Monday. 5. MBD-CKD: Resumed phosphorus binders/sensiparand Hectorol with  dialysis. 6. Afib, Hx PPM: HR stable. Was on heparin bridge and warfarin, now warfarin alone. INR 3. 7. Debility: Admitted to CIR on 11/29 8. Blindness 9. Hx TAVR 10. CAD: heart cath 2018 minimal CAD < 30% stenosis 10. Nutrition: Alb low, continue supplements and liberalized diet. 11. HTN/volume: prior EDW still seems appropriate.  Veneta Penton, PA-C 02/28/2018, 10:17 AM  [facility] Pager: [phone]

## 2018-02-28 NOTE — Progress Notes (Signed)
Physical Therapy Session Note  Patient Details  Name: Alexander [name] MRN: [id number] Date of Birth: May 26, 1940  Today's Date: 02/28/2018 PT Individual Time: 1430-1505 PT Individual Time Calculation (min): 35 min   Short Term Goals: Week 1:  PT Short Term Goal 1 (Week 1): Pt will complete transfers with min A consistently PT Short Term Goal 2 (Week 1): Pt will ambulate x 50 ft with LRAD and min A PT Short Term Goal 3 (Week 1): Pt will ascend/descend one 6" step with mod A  Skilled Therapeutic Interventions/Progress Updates:    Pt received supine in bed, agreeable to PT. No complaints of pain. Supine to sit with mod A for BLE management. Stand pivot transfer bed to w/c with min A with max cueing for hand placement and safety during transfer. Pt is able to feed himself a snack of peanut butter and graham crackers with setup A. Sit to stand with min A to rollator. Ambulation 2 x 50 ft with rollator and min A for balance, v/c for upright trunk posture with gait. Pt tends to ambulate with flexed trunk and B knees flexed during gait. Raised rollator height with some improvement in posture noted. Pt reports feeling fatigued following ambulation. Pt agreeable to stay seated up in recliner in room to engage in socialization with family dog. Pt left seated in recliner in room with needs in reach, family present. Education with family to call for assist if they are going to be leaving patient alone to have someone assist pt back to bed or attach quick-release belt. Pt missed 25 min of scheduled therapy session due to onset of fatigue.  Therapy Documentation Precautions:  Precautions Precautions: Fall Precaution Comments: Pt is blind, can seen peripherally on the L slightly Restrictions Weight Bearing Restrictions: No General: PT Amount of Missed Time (min): 25 Minutes PT Missed Treatment Reason: Patient fatigue   Therapy/Group: Individual Therapy  Excell Seltzer, PT, DPT  02/28/2018, 3:09 PM

## 2018-02-28 NOTE — IPOC Note (Signed)
Overall Plan of Care [facility]) Patient Details Name: Alexander [name] MRN: [id number] DOB: 02/19/1941  Admitting Diagnosis: <principal problem not specified>  Hospital Problems: Active Problems:   Debility     Functional Problem List: Nursing Bowel, Endurance, Medication Management, Pain, Safety, Skin Integrity  PT Balance, Endurance, Pain, Perception, Safety, Sensory  OT Balance, Safety, Sensory, Endurance, Motor, Pain, Vision, Perception, Cognition  SLP    TR         Basic ADL's: OT Eating, Grooming, Bathing, Dressing, Toileting     Advanced  ADL's: OT       Transfers: PT Bed Mobility, Bed to Chair, Car, [employer], Futures trader, Metallurgist: PT Ambulation, Stairs     Additional Impairments: OT None  SLP        TR      Anticipated Outcomes Item Anticipated Outcome  Self Feeding set up  Swallowing      Basic self-care  set up  Toileting  set up   [employer] set up  Bowel/Bladder  Min assist with bowel movement every 1-2 days  Transfers  Supervision  Locomotion  Supervision with RW  Communication     Cognition     Pain  <3 on a 1-10 pain scale  Safety/Judgment  min assist with using soft touch call bell to call for assistance   Therapy Plan: PT Intensity: Minimum of 1-2 x/day ,45 to 90 minutes PT Frequency: 5 out of 7 days PT Duration Estimated Length of Stay: 12-14 days OT Intensity: Minimum of 1-2 x/day, 45 to 90 minutes OT Frequency: 5 out of 7 days OT Duration/Estimated Length of Stay: 10-14 days      Team Interventions: Nursing Interventions Patient/Family Education, Pain Management, Medication Management, Discharge Planning, Bowel Management, Skin Care/Wound Management, Disease Management/Prevention  PT interventions Ambulation/gait training, Training and development officer, Community reintegration, Discharge planning, Cognitive remediation/compensation, Disease management/prevention, DME/adaptive equipment  instruction, Functional mobility training, Neuromuscular re-education, Pain management, Patient/family education, Psychosocial support, Stair training, Therapeutic Activities, Therapeutic Exercise, UE/LE Strength taining/ROM, UE/LE Coordination activities, Visual/perceptual remediation/compensation  OT Interventions Balance/vestibular training, Discharge planning, Pain management, Self Care/advanced ADL retraining, Therapeutic Activities, UE/LE Coordination activities, Cognitive remediation/compensation, Functional mobility training, Disease mangement/prevention, Patient/family education, Skin care/wound managment, Therapeutic Exercise, Visual/perceptual remediation/compensation, DME/adaptive equipment instruction, Community reintegration, Neuromuscular re-education, Psychosocial support, UE/LE Strength taining/ROM, Wheelchair propulsion/positioning  SLP Interventions    TR Interventions    SW/CM Interventions Discharge Planning, Psychosocial Support, Patient/Family Education   Barriers to Discharge MD  Medical stability  Nursing Inaccessible home environment, Medical stability, Hemodialysis, Medication compliance    PT Medical stability, Home environment access/layout    OT      SLP      SW       Team Discharge Planning: Destination: PT-Home ,OT- Home , SLP-  Projected Follow-up: PT-Home health PT, OT-  Home health OT, SLP-  Projected Equipment Needs: PT-Rolling walker with 5" wheels, OT- To be determined, SLP-  Equipment Details: PT-pt owns rollator, may need RW, OT-  Patient/family involved in discharge planning: PT- Patient, Family member/caregiver,  OT-Family Midwife, Patient, SLP-   MD ELOS: 10-14 days Medical Rehab Prognosis:  Excellent Assessment: The patient has been admitted for CIR therapies with the diagnosis of debility after multiple medical. The team will be addressing functional mobility, strength, stamina, balance, safety, adaptive techniques and equipment,  self-care, bowel and bladder mgt, patient and caregiver education, activity tolerance, wound care, community reentry. Goals have been set at set up to supervision  with self-care tasks and supervision for transfers and locomotion.    Meredith Staggers, MD, FAAPMR      See Team Conference Notes for weekly updates to the plan of care

## 2018-02-28 NOTE — Progress Notes (Signed)
Physical Therapy Session Note  Patient Details  Name: Alexander [name] MRN: [id number] Date of Birth: 06-03-1940  Today's Date: 02/28/2018 PT Individual Time: 6629-4765 PT Individual Time Calculation (min): 13 min   Short Term Goals: Week 1:  PT Short Term Goal 1 (Week 1): Pt will complete transfers with min A consistently PT Short Term Goal 2 (Week 1): Pt will ambulate x 50 ft with LRAD and min A PT Short Term Goal 3 (Week 1): Pt will ascend/descend one 6" step with mod A  Skilled Therapeutic Interventions/Progress Updates:    pt in bed, fatigued from previous sessions.  Pt's wife states the PA has ordered a CT scan due to LE pain and crepitus.  Pt states he is too fatigued to participate this session.  BP supine 96/43, pt then performs supine to sit with min A.  BP in sitting 94/53.  Pt performs sit to supine with min A.  Pt left in supine with needs at hand, wife present, RN aware of low BP.  Therapy Documentation Precautions:  Precautions Precautions: Fall Precaution Comments: Pt is blind, can seen peripherally on the L slightly Restrictions Weight Bearing Restrictions: No General: PT Amount of Missed Time (min): 15 Minutes PT Missed Treatment Reason: Patient fatigue;Patient ill (Comment) Pain:  no c/o pain   Therapy/Group: Individual Therapy  DONAWERTH,[name] 02/28/2018, 12:06 PM

## 2018-02-28 NOTE — Progress Notes (Signed)
Occupational Therapy Session Note  Patient Details  Name: Alexander [name] MRN: [id number] Date of Birth: 1940/07/20  Today's Date: 02/28/2018 OT Individual Time: 0915-1000 OT Individual Time Calculation (min): 45 min   Short Term Goals: Week 1:  OT Short Term Goal 1 (Week 1): Pt will complete bathing EOB with no more than 1 rest break OT Short Term Goal 2 (Week 1): Pt will don B socks with CGA OT Short Term Goal 3 (Week 1): Pt will transfer to [facility] with CGA OT Short Term Goal 4 (Week 1): Pt will don pants with CGA  Skilled Therapeutic Interventions/Progress Updates:    Pt greeted seated in wc with daughter present after receiving medications and evaluation of LE's per PA Pam. Pt completed stand-pivot to therapy mat w/ RW and CGA. UB strengthening using 1lb dowel rod. 3 setx of 10 bicep curls, straight arm raises, and chest press. Pt needed extended rest break between sets. Activity tolerance and strengthening with sit<>stand card matching activity. Pt needed rest breaks after 2 sit<>stands and guided A to place cards 2/2 visual deficits. Pt ambulated 15 ft x2 with extended rest break. Pt returned to room and left seated in wc with family and PA present.   Therapy Documentation Precautions:  Precautions Precautions: Fall Precaution Comments: Pt is blind, can seen peripherally on the L slightly Restrictions Weight Bearing Restrictions: No General: General OT Amount of Missed Time: 15 Minutes 2/2 nursing care, PA  Checking pt's LEs Pain:   none/denies pain Other Treatments:     Therapy/Group: Individual Therapy  Valma Cava 02/28/2018, 10:13 AM

## 2018-02-28 NOTE — Progress Notes (Signed)
Patient examined per renal PA input. Noted to have crepitus along left medial/lateral thigh down to mid calf and along right lateral leg from knee to calf. Pain from last week has resolved. He denies any pain , weakness, numbness/tingling.BLE warm to touch and appear well perfused --pulses diminished but present.   --Message left at Dr. Zettie Pho office for input and recommendations.  --Discussed with Dr. Naaman [name]-- need for CT left leg? CT pelvis?. Contacted  vascular PA for input who felt that it did not sound to be vascular in nature and defer to surgery. Will monitor for now.

## 2018-02-28 NOTE — Progress Notes (Signed)
Social Work  Social Work Assessment and Plan  Patient Details  Name: Alexander [name] MRN: [id number] Date of Birth: Jul 17, 1940  Today's Date: 02/27/2018  Problem List:  Patient Active Problem List   Diagnosis Date Noted  . Debility 02/24/2018  . Hypoxia   . Acute on chronic anemia   . ESRD on dialysis ([place])   . Postoperative pain   . Chronic diastolic congestive heart failure ([place])   . Coronary artery disease involving native coronary artery of native heart without angina pectoris   . Status post placement of cardiac pacemaker   . History of CVA with residual deficit   . Diabetes mellitus type 2 in nonobese (HCC)   . Atrial fibrillation ([place])   . Bladder cancer ([place]) 02/17/2018  . Abnormal CT scan, colon   . Benign neoplasm of ascending colon   . Benign neoplasm of cecum   . Benign neoplasm of transverse colon   . Benign neoplasm of descending colon   . S/P TURP 12/14/2017  . Cancer of overlapping sites of bladder ([place]) 12/13/2017  . Post-operative complication 06/17/2246  . Balanitis 10/31/2017  . Failure to thrive syndrome, adult 10/30/2017  . Hematuria 10/21/2017  . UTI (urinary tract infection) 10/20/2017  . Syncope 05/12/2017  . Near syncope 05/12/2017  . Elevated troponin 05/12/2017  . CVA (cerebral vascular accident) ([place]) 04/12/2017  . Dialysis patient ([place]) 04/12/2017  . Seasonal allergies   . S/P epidural steroid injection   . Peptic ulcer   . Paroxysmal atrial fibrillation (HCC)   . Macular degeneration   . Legally blind   . Kidney stones   . Hypertension   . ESRD (end stage renal disease) ([place])   . Dyspnea   . Diverticulosis   . Diabetes mellitus without complication ([place])   . Colon polyps   . Anemia   . Presence of permanent cardiac pacemaker 11/24/2016  . Atrial fibrillation with RVR ([place]) 11/23/2016  . Glaucoma 11/23/2016  . GERD (gastroesophageal reflux disease) 11/23/2016  . Hypomagnesemia 11/23/2016  . Diabetes mellitus, type II ([place])  11/23/2016  . Depression 11/23/2016  . S/P TAVR (transcatheter aortic valve replacement) 08/31/2016  . Long term (current) use of anticoagulants [Z79.01] 08/13/2016  . Anticoagulated on Coumadin 01/19/2016  . Impacted cerumen of left ear 01/19/2016  . Otorrhagia of right ear 01/19/2016  . Sudden right hearing loss 01/19/2016  . Persistent atrial fibrillation   . Carotid occlusion, left 11/04/2015  . Carpal tunnel syndrome, bilateral 11/03/2015  . Diabetic peripheral neuropathy ([place]) 10/02/2015  . Orthostatic hypotension 09/09/2015  . Bradycardia 03/30/2015  . OAG (open angle glaucoma) suspect, high risk, left 05/28/2014  . Long-term (current) use of anticoagulants 04/08/2014  . Hyperlipidemia 04/04/2014  . Primary open-angle glaucoma 07/10/2013  . ESRD (end stage renal disease) on dialysis ([place]) 03/13/2013  . Pseudophakia 01/02/2013  . PVD (posterior vitreous detachment) 01/02/2013  . PAD (peripheral artery disease) ([place]) 12/12/2012  . Occlusion and stenosis of bilateral carotid arteries 08/17/2012  . Severe aortic stenosis 06/15/2012  . Aortic stenosis 06/15/2012  . Organ transplant candidate 01/20/2012  . Exudative macular degeneration ([place]) 12/02/2011  . Retinal detachment 12/02/2011  . Hx of adenomatous colonic polyps 02/03/2011  . Esophageal reflux 02/03/2011  . Anemia, unspecified 02/03/2011  . Barrett's esophagus 05/28/2003  . Tubular adenoma of colon 07/27/2001   Past Medical History:  Past Medical History:  Diagnosis Date  . Anemia   . Aortic stenosis 06/15/12   TEE - EF 55-60%; grade 1  diastolic dysfunction; mild/mod aortic valve stenosis; Mitral valve had calcified annulus, mild pulm htn PA peak pressure 42mmHg  . Barrett's esophagus 05/2003  . Bradycardia 2017   [employer] 2240 Assurity dual-lead pacemaker  . Cancer ([place])    bladder and kidney  . Carpal tunnel syndrome, bilateral 11/03/2015  . CHF (congestive heart failure) ([place])   . Colon polyps   .  Coronary artery disease   . CVA (cerebral infarction)    2004/affected left side  . Depression   . Diabetes mellitus without complication ([place])   . Diabetic peripheral neuropathy ([place]) 10/02/2015  . Diverticulosis   . Dyspnea    with exertion  . ESRD (end stage renal disease) on dialysis ([place])    "[facility]; [place]; MWF" (05/12/2017)  . Failure to thrive syndrome, adult 10/30/2017  . GERD (gastroesophageal reflux disease)   . Glaucoma   . History of kidney stones   . Hyperlipidemia   . Hypertension   . Legally blind   . Macular degeneration    both eyes  . Orthostatic hypotension 09/09/2015  . Paroxysmal atrial fibrillation (HCC)   . Peptic ulcer    bleeding, 1969  . PONV (postoperative nausea and vomiting)   . Presence of permanent cardiac pacemaker   . S/P epidural steroid injection    last  injection over 10 years ago  . Seasonal allergies   . Tubular adenoma of colon 07/2001   Past Surgical History:  Past Surgical History:  Procedure Laterality Date  . AV FISTULA PLACEMENT  2009  . BACK SURGERY    . CARDIOVERSION N/A 11/13/2015   Procedure: CARDIOVERSION;  Surgeon: Troy Sine, MD;  Location: [facility] ENDOSCOPY;  Service: Cardiovascular;  Laterality: N/A;  . CARDIOVERSION N/A 01/13/2016   Procedure: CARDIOVERSION;  Surgeon: Will Meredith [name], MD;  Location: [facility];  Service: Cardiovascular;  Laterality: N/A;  . COLONOSCOPY WITH PROPOFOL N/A 01/05/2018   Procedure: COLONOSCOPY WITH PROPOFOL;  Surgeon: Ladene Artist, MD;  Location[facility] ENDOSCOPY;  Service: Endoscopy;  Laterality: N/A;  . CORNEAL TRANSPLANT  1999   right eye  . CYSTOSCOPY  several times   kidney stones  . CYSTOSCOPY W/ RETROGRADES Bilateral 12/13/2017   Procedure: CYSTOSCOPY WITH RETROGRADE PYELOGRAM;  Surgeon: Irine Seal, MD;  Location: [facility] ORS;  Service: Urology;  Laterality: Bilateral;  . EP IMPLANTABLE DEVICE N/A 03/11/2015   Procedure: Pacemaker Implant;  Surgeon: Will Meredith [name], MD;  [place];  Laterality: Left  . LAMINECTOMY  1969  . POLYPECTOMY  01/05/2018   Procedure: POLYPECTOMY;  Surgeon: Ladene Artist, MD;  Location: [facility] ENDOSCOPY;  Service: Endoscopy;;  . RIGHT/LEFT HEART CATH AND CORONARY ANGIOGRAPHY N/A 08/20/2016   Procedure: Right/Left Heart Cath and Coronary Angiography;  Surgeon: Burnell Blanks, MD;  Location: [place] CV LAB;  Service: Cardiovascular;  Laterality: N/A;  . ROBOT ASSITED LAPAROSCOPIC NEPHROURETERECTOMY Bilateral 02/17/2018   Procedure: XI ROBOT ASSITED LAPAROSCOPIC NEPHROURETERECTOMY;  Surgeon: Alexis Frock, MD;  Location: [facility];  Service: Urology;  Laterality: Bilateral;  . TEE WITHOUT CARDIOVERSION N/A 07/22/2016   Procedure: TRANSESOPHAGEAL ECHOCARDIOGRAM (TEE);  Surgeon: Skeet Latch, MD;  Location: [place];  Service: Cardiovascular;  Laterality: N/A;  . TEE WITHOUT CARDIOVERSION N/A 08/31/2016   Procedure: TRANSESOPHAGEAL ECHOCARDIOGRAM (TEE);  Surgeon: Burnell Blanks, MD;  Location: [place];  Service: Open Heart Surgery;  Laterality: N/A;  . TEE WITHOUT CARDIOVERSION N/A 12/01/2016   Procedure: TRANSESOPHAGEAL ECHOCARDIOGRAM (TEE);  Surgeon: Larey Dresser, MD;  Location: [facility] ENDOSCOPY;  Service: Cardiovascular;  Laterality: N/A;  . TONSILLECTOMY  1964  . TRANSCATHETER AORTIC VALVE REPLACEMENT, TRANSFEMORAL N/A 08/31/2016   Procedure: TRANSCATHETER AORTIC VALVE REPLACEMENT, TRANSFEMORAL;  Surgeon: Burnell Blanks, MD;  Location: [facility];  Service: Open Heart Surgery;  Laterality: N/A;  . TRANSURETHRAL RESECTION OF BLADDER TUMOR N/A 12/13/2017   Procedure: TRANSURETHRAL RESECTION OF BLADDER TUMOR (TURBT);  Surgeon: Irine Seal, MD;  Location: [facility];  Service: Urology;  Laterality: N/A;   Social History:  reports that he quit smoking about 20 years ago. His smoking use included cigarettes. He has a 90.00 pack-year smoking history. He has never used smokeless tobacco. He reports that he does not  drink alcohol or use drugs.  Family / Support Systems Marital Status: Married Patient Roles: Spouse, Parent Spouse/Significant Other: wife, Sedrick Tober @ [phone] Children: daughter, Jaleen Finch - Chrystine Oiler @ (C) [phone];  daughter, Floyde Dingley @ [phone];  daughter, Claudia Pollock @ (C) [phone] - ALL LOCAL Anticipated Caregiver: both wife and 3 daugther live nearby Ability/Limitations of Caregiver: Min A Caregiver Availability: 24/7 Family Dynamics: Pt and wife report daughters are all very supportive and willing to assist as they are able.  Daughter, [name], on the phone per wife's choice to participate in assessment interivew process.  Social History Preferred language: English Religion: Baptist Cultural Background: NA Read: Yes Write: Yes Employment Status: Retired Freight forwarder Issues: None Guardian/Conservator: None - per MD, pt is capable of making decisions on his own behalf.   Abuse/Neglect Abuse/Neglect Assessment Can Be Completed: Yes Physical Abuse: Denies Verbal Abuse: Denies Sexual Abuse: Denies Exploitation of patient/patient's resources: Denies Self-Neglect: Denies  Emotional Status Pt's affect, behavior adn adjustment status: Pt very pleasant and completes assessment interview without much difficulty.  Wife and daughter also participate in providing information.  Pt eager to get home ASAP.  Pt denies any significant emotional distress.  Will monitor and refer for neuropsyc as indicated. Recent Psychosocial Issues: None Pyschiatric History: None Substance Abuse History: None  Patient / Family Perceptions, Expectations & Goals Pt/Family understanding of illness & functional limitations: Pt and family with good understanding of medical issues.  Both pleased with report today that "the cancer was contained in the bladder.  They got it all." Premorbid pt/family roles/activities: Independent overall.  Using a rollator.   Wife provided assist as needed due to pt's blindness Anticipated changes in roles/activities/participation: little change anticipated if pt able to reach supervision goals. Pt/family expectations/goals: "I just need to get my strength back up."  [employer]: Other (Comment)(HD at [facility]) Premorbid Home Care/DME Agencies: None Transportation available at discharge: yes  Discharge Planning Living Arrangements: Spouse/significant other Support Systems: Spouse/significant other, Children Type of Residence: Private residence Insurance Resources: [employer], Multimedia programmer (specify)(BCBS) Financial Resources: [place] Referred: No Living Expenses: Own Money Management: Spouse Does the patient have any problems obtaining your medications?: No Home Management: Pt and wife Patient/Family Preliminary Plans: Pt to return home with wife as primary caregiver. Social Work Anticipated Follow Up Needs: HH/OP Expected length of stay: 7-10 days  Clinical Impression Pt very pleasant but clearly fatigued.  Allows wife and daughter to answer most questions.  Family very involved and ready to provide 24/7 support.  Pt denies any s/s of emotional distress.  Will follow for d/c planning needs.  Toleen Lachapelle 02/27/2018, 4:01 PM

## 2018-02-28 NOTE — Progress Notes (Signed)
[place] PHYSICAL MEDICINE & REHABILITATION PROGRESS NOTE   Subjective/Complaints: No new complaints. Slept well. Was "worn out from therapy yesterday"  ROS: Patient denies fever, rash, sore throat, blurred vision, nausea, vomiting, diarrhea, cough, shortness of breath or chest pain, joint or back pain, headache, or mood change.    Objective:   No results found. Recent Labs    02/26/18 0532 02/27/18 1413  WBC 9.9 9.2  HGB 9.3* 9.1*  HCT 30.2* 30.1*  PLT 178 207   Recent Labs    02/27/18 1412  NA 130*  K 4.5  CL 92*  CO2 20*  GLUCOSE 112*  BUN 114*  CREATININE 8.99*  CALCIUM 7.3*    Intake/Output Summary (Last 24 hours) at 02/28/2018 0839 Last data filed at 02/28/2018 0758 Gross per 24 hour  Intake 240 ml  Output 1000 ml  Net -760 ml     Physical Exam: Vital Signs Blood pressure (!) 120/52, pulse 61, temperature 97.8 F (36.6 C), resp. rate 17, height 5\' 10"  (1.778 m), weight 87.5 kg, SpO2 (!) 88 %. Constitutional: No distress . Vital signs reviewed. HEENT: EOMI, oral membranes moist Neck: supple Cardiovascular:  IRR with murmur. No JVD    Respiratory: CTA Bilaterally without wheezes or rales. Normal effort    GI: BS +, non-tender, non-distended  Musculoskeletal: He exhibits edema (left thigh).  Edema and tenderness left flank  Neurological: He is alert and oriented to person, place, and time.  Follow simple commands Oriented to person, place, month/year Motor: Right upper extremity/right lower extremity: 5/5 proximal distal Left upper extremity: 4+/5 proximal distal--stable Left lower extremity: Hip flexion 3+/5, knee extension 2+/5, ankle dorsiflexion 4/5 Right lower extremity: Hip flexion 4-/5, knee extension 3/5, ankle dorsiflexion 4+/5 Sensation diminished light touch left upper and left lower extremity--stable Skin: Skin is warm and dry.  Abdominal and flank wounds all closed,scarre/scabbed  Psychiatric:pleasant, cooperative. folllows all  commands   Assessment/Plan: 1. Functional deficits secondary to debility which require 3+ hours per day of interdisciplinary therapy in a comprehensive inpatient rehab setting.  Physiatrist is providing close team supervision and 24 hour management of active medical problems listed below.  Physiatrist and rehab team continue to assess barriers to discharge/monitor patient progress toward functional and medical goals  Care Tool:  Bathing  Bathing activity did not occur: Safety/medical concerns(first day admission) Body parts bathed by patient: Right arm, Left arm, Chest, Abdomen, Face(UB only this am per pt request)         Bathing assist Assist Level: Supervision/Verbal cueing     Upper Body Dressing/Undressing Upper body dressing Upper body dressing/undressing activity did not occur (including orthotics): N/A What is the patient wearing?: Pull over shirt    Upper body assist Assist Level: Supervision/Verbal cueing    Lower Body Dressing/Undressing Lower body dressing    Lower body dressing activity did not occur: N/A What is the patient wearing?: Pants, Underwear/pull up     Lower body assist Assist for lower body dressing: Contact Guard/Touching assist     Toileting Toileting Toileting Activity did not occur (Clothing management and hygiene only): N/A (no void or bm)  Toileting assist Assist for toileting: Moderate Assistance - Patient 50 - 74%     Transfers Chair/bed transfer  Transfers assist  Chair/bed transfer activity did not occur: Safety/medical concerns  Chair/bed transfer assist level: Moderate Assistance - Patient 50 - 74%     Locomotion Ambulation   Ambulation assist      Assist level: Minimal Assistance -  Patient > 75% Assistive device: Walker-rolling Max distance: 30   Walk 10 feet activity   Assist     Assist level: Minimal Assistance - Patient > 75% Assistive device: Walker-rolling   Walk 50 feet activity   Assist Walk 50  feet with 2 turns activity did not occur: Safety/medical concerns         Walk 150 feet activity   Assist Walk 150 feet activity did not occur: Safety/medical concerns         Walk 10 feet on uneven surface  activity   Assist Walk 10 feet on uneven surfaces activity did not occur: Safety/medical concerns         Wheelchair     Assist Will patient use wheelchair at discharge?: No             Wheelchair 50 feet with 2 turns activity    Assist            Wheelchair 150 feet activity     Assist           Medical Problem List and Plan: 1.  Deficits with mobility, transfers, self-care secondary to debility status post extensive urological procedures  -Interdisciplinary Team Conference today   2.  DVT Prophylaxis/Anticoagulation: Mechanical: Sequential compression devices, below knee Bilateral lower extremities 3. Chronic pain/Pain Management: Continue gabapentin. Controlled by tylenol prn 4. Mood: LCSW to follow for evaluation and support.  5. Neuropsych: This patient is somewhat capable of making decisions on his own behalf. 6. Skin/Wound Care: Routine pressure relief measures. Monitor wound daily for resolution of drainage.  7. Fluids/Electrolytes/Nutrition: Routine labs with HD. Renal diet with 1200 cc FR.  8. Bladder cancer s/p resection: Continue to monitor wound for healing. GU following for input.   9. ESRD: HD MWF at the end of the day to help with tolerance of therapy.   -daily weights 10.  CAD s/p PPM for bradycardia: Continue to monitor BP/HR bid.  11.  Anemia of chronic disease:  H/H stable post 2 units on 11/26. Continue to monitor with serial check on HD days.  12.  Chronic A Fib: Heart rate controlled on lanoxin daily. Off metoprolol and diltiazem at this time.   -coumadin resumed.  13. H/o Emphysema: Continue prednisone --resume prn inhaler. Has been weaned off oxygen--  -IS, supp O2 if needed   -tolerating activities with  therapy 14. 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  51. H/o CVA: Loss of STM and vision.     LOS: 4 days A FACE TO FACE EVALUATION WAS PERFORMED  Meredith Staggers 02/28/2018, 8:39 AM

## 2018-03-01 ENCOUNTER — Inpatient Hospital Stay (HOSPITAL_COMMUNITY): Payer: Medicare Other | Admitting: Physical Therapy

## 2018-03-01 ENCOUNTER — Inpatient Hospital Stay (HOSPITAL_COMMUNITY): Payer: Medicare Other

## 2018-03-01 DIAGNOSIS — T797XXD Traumatic subcutaneous emphysema, subsequent encounter: Secondary | ICD-10-CM

## 2018-03-01 LAB — RENAL FUNCTION PANEL
Albumin: 2.2 g/dL — ABNORMAL LOW (ref 3.5–5.0)
Anion gap: 14 (ref 5–15)
BUN: 68 mg/dL — ABNORMAL HIGH (ref 8–23)
CO2: 24 mmol/L (ref 22–32)
Calcium: 7.9 mg/dL — ABNORMAL LOW (ref 8.9–10.3)
Chloride: 93 mmol/L — ABNORMAL LOW (ref 98–111)
Creatinine, Ser: 7.12 mg/dL — ABNORMAL HIGH (ref 0.61–1.24)
GFR calc Af Amer: 8 mL/min — ABNORMAL LOW (ref >60)
GFR calc non Af Amer: 7 mL/min — ABNORMAL LOW (ref >60)
GLUCOSE: 84 mg/dL (ref 70–99)
Phosphorus: 4.6 mg/dL (ref 2.5–4.6)
Potassium: 4.4 mmol/L (ref 3.5–5.1)
Sodium: 131 mmol/L — ABNORMAL LOW (ref 135–145)

## 2018-03-01 LAB — CBC
HCT: 29.3 % — ABNORMAL LOW (ref 39.0–52.0)
Hemoglobin: 9 g/dL — ABNORMAL LOW (ref 13.0–17.0)
MCH: 29 pg (ref 26.0–34.0)
MCHC: 30.7 g/dL (ref 30.0–36.0)
MCV: 94.5 fL (ref 80.0–100.0)
Platelets: 217 10*3/uL (ref 150–400)
RBC: 3.1 MIL/uL — ABNORMAL LOW (ref 4.22–5.81)
RDW: 18.6 % — ABNORMAL HIGH (ref 11.5–15.5)
WBC: 10.5 10*3/uL (ref 4.0–10.5)
nRBC: 0 % (ref 0.0–0.2)

## 2018-03-01 LAB — PROTIME-INR
INR: 3.56
Prothrombin Time: 35.1 seconds — ABNORMAL HIGH (ref 11.4–15.2)

## 2018-03-01 LAB — GLUCOSE, CAPILLARY: Glucose-Capillary: 104 mg/dL — ABNORMAL HIGH (ref 70–99)

## 2018-03-01 MED ORDER — DOXERCALCIFEROL 4 MCG/2ML IV SOLN
INTRAVENOUS | Status: AC
  Filled 2018-03-01: qty 2

## 2018-03-01 MED ORDER — WARFARIN SODIUM 1 MG PO TABS
1.0000 mg | ORAL_TABLET | Freq: Once | ORAL | Status: AC
  Administered 2018-03-01: 1 mg via ORAL
  Filled 2018-03-01: qty 1

## 2018-03-01 NOTE — Progress Notes (Signed)
Physical Therapy Session Note  Patient Details  Name: DEMARIAN EPPS MRN: [id number] Date of Birth: 26-Dec-1940  Today's Date: 03/01/2018 PT Individual Time: 0803-0900 PT Individual Time Calculation (min): 57 min   Short Term Goals: Week 1:  PT Short Term Goal 1 (Week 1): Pt will complete transfers with min A consistently PT Short Term Goal 2 (Week 1): Pt will ambulate x 50 ft with LRAD and min A PT Short Term Goal 3 (Week 1): Pt will ascend/descend one 6" step with mod A  Skilled Therapeutic Interventions/Progress Updates: Pt presented in sleeping easy to arouse and agreeable to therapy. Pt stating pain "as usual" in BLE but no numerical assessment. Performed supine to sit supervision with use of bed features. PTA provided minA for threading pants and total A donning shoes for time management. Performed STS from EOB CGA and supervision for pulling up pants. Discussed use of RW vs rollator, per pt feels more comfortable using rollator as what he's accustomed to. Pt ambulated 164ft with rollator and additional 5ft with seated rest on rollator. After therapeutic break pt participated in STS without use of AD x 5 for BLE and static balance. Pt initially required min tactile cues to decrease bracing LE against mat. Pt noted to have mild posterior lean upon standing however was able to correct. Participated in toe taps to 4in step with touching assist of rollator initially requiring tactile cues for decreased pressure of BUE. Standing balance on Airex x 2 bouts 30sec ea using rollator to step up onto mat and standing without AD. Pt with both posterior and lateral sway with PTA providing tactile cues for correction. Pt noted not to have eaten breakfast as of yet and is scheduled for dialysis today with dovetail therapy sessions. Pt transported back to room and returned to w/c. Pt left with belt alarm on, call bell within reach and meal tray set up.      Therapy Documentation Precautions:   Precautions Precautions: Fall Precaution Comments: Pt is blind, can seen peripherally on the L slightly Restrictions Weight Bearing Restrictions: No   Therapy/Group: Individual Therapy  Keymoni Mccaster  Ahmari Duerson, PTA  03/01/2018, 9:11 AM

## 2018-03-01 NOTE — Progress Notes (Signed)
[place] PHYSICAL MEDICINE & REHABILITATION PROGRESS NOTE   Subjective/Complaints: Pt slept well. No new complaints. Therapy wears him out.   ROS: Patient denies fever, rash, sore throat, blurred vision, nausea, vomiting, diarrhea, cough, shortness of breath or chest pain,  headache, or mood change.   Objective:   No results found. Recent Labs    02/27/18 1413  WBC 9.2  HGB 9.1*  HCT 30.1*  PLT 207   Recent Labs    02/27/18 1412  NA 130*  K 4.5  CL 92*  CO2 20*  GLUCOSE 112*  BUN 114*  CREATININE 8.99*  CALCIUM 7.3*    Intake/Output Summary (Last 24 hours) at 03/01/2018 0857 Last data filed at 03/01/2018 0800 Gross per 24 hour  Intake 360 ml  Output -  Net 360 ml     Physical Exam: Vital Signs Blood pressure (!) 145/96, pulse 72, temperature 98.1 F (36.7 C), resp. rate 18, height 5\' 10"  (1.778 m), weight 88.1 kg, SpO2 96 %. Constitutional: No distress . Vital signs reviewed. HEENT: EOMI, oral membranes moist Neck: supple Cardiovascular: RRR without murmur. No JVD    Respiratory: CTA Bilaterally without wheezes or rales. Normal effort    GI: BS +, non-tender, non-distended  Musculoskeletal: He exhibits edema (left thigh).  Edema and tenderness left flank. Crepitus in left>right lower ext. No pain with palpation. Normal pulses, skin warm, no discolorations Neurological: Oriented to person, place, month/year Motor: Right upper extremity/right lower extremity: 5/5 proximal distal Left upper extremity: 4+/5 proximal distal--stable Left lower extremity: Hip flexion 3+/5, knee extension 2+/5, ankle dorsiflexion 4/5 Right lower extremity: Hip flexion 4-/5, knee extension 3/5, ankle dorsiflexion 4+/5 Sensation diminished light touch left upper and left lower extremity--stable Skin: Skin is warm and dry.  Abdominal and flank wounds all closed,scarre/scabbed--no change, sutures in flank  Psychiatric: pleasant   Assessment/Plan: 1. Functional deficits secondary  to debility which require 15 hours over 7 days of interdisciplinary therapy in a comprehensive inpatient rehab setting.  Physiatrist is providing close team supervision and 24 hour management of active medical problems listed below.  Physiatrist and rehab team continue to assess barriers to discharge/monitor patient progress toward functional and medical goals  Care Tool:  Bathing  Bathing activity did not occur: Safety/medical concerns(first day admission) Body parts bathed by patient: Right arm, Left arm, Chest, Abdomen, Face(UB only this am per pt request)         Bathing assist Assist Level: Supervision/Verbal cueing     Upper Body Dressing/Undressing Upper body dressing Upper body dressing/undressing activity did not occur (including orthotics): N/A What is the patient wearing?: Pull over shirt    Upper body assist Assist Level: Supervision/Verbal cueing    Lower Body Dressing/Undressing Lower body dressing    Lower body dressing activity did not occur: N/A What is the patient wearing?: Pants, Underwear/pull up     Lower body assist Assist for lower body dressing: Contact Guard/Touching assist     Toileting Toileting Toileting Activity did not occur (Clothing management and hygiene only): N/A (no void or bm)  Toileting assist Assist for toileting: Moderate Assistance - Patient 50 - 74%     Transfers Chair/bed transfer  Transfers assist  Chair/bed transfer activity did not occur: Safety/medical concerns  Chair/bed transfer assist level: Minimal Assistance - Patient > 75%     Locomotion Ambulation   Ambulation assist      Assist level: Minimal Assistance - Patient > 75% Assistive device: Rollator Max distance: 34'   Walk  10 feet activity   Assist     Assist level: Minimal Assistance - Patient > 75% Assistive device: Rollator   Walk 50 feet activity   Assist Walk 50 feet with 2 turns activity did not occur: Safety/medical concerns  Assist  level: Minimal Assistance - Patient > 75% Assistive device: Rollator    Walk 150 feet activity   Assist Walk 150 feet activity did not occur: Safety/medical concerns         Walk 10 feet on uneven surface  activity   Assist Walk 10 feet on uneven surfaces activity did not occur: Safety/medical concerns         Wheelchair     Assist Will patient use wheelchair at discharge?: No             Wheelchair 50 feet with 2 turns activity    Assist            Wheelchair 150 feet activity     Assist           Medical Problem List and Plan: 1.  Deficits with mobility, transfers, self-care secondary to debility status post extensive urological procedures  --Continue CIR therapies including PT, OT   -change to 15/7 intensity  2.  DVT Prophylaxis/Anticoagulation: Mechanical: Sequential compression devices, below knee Bilateral lower extremities 3. Chronic pain/Pain Management: Continue gabapentin. Controlled by tylenol prn 4. Mood: LCSW to follow for evaluation and support.  5. Neuropsych: This patient is somewhat capable of making decisions on his own behalf. 6. Skin/Wound Care: Routine pressure relief measures. Monitor wound daily for resolution of drainage.  7. Fluids/Electrolytes/Nutrition: Routine labs with HD. Renal diet with 1200 cc FR.  8. Bladder cancer s/p resection: Continue to monitor wound for healing. GU following for input.   9. ESRD: HD MWF at the end of the day to help with tolerance of therapy.   -daily weights ongoing 10.  CAD s/p PPM for bradycardia: Continue to monitor BP/HR bid.  11.  Anemia of chronic disease:  H/H stable post 2 units on 11/26. Continue to monitor with serial check on HD days.  12.  Chronic A Fib: Heart rate controlled on lanoxin daily. Off metoprolol and diltiazem at this time.   -coumadin resumed.  13. H/o Emphysema: Continue prednisone -- prn inhaler. Has been weaned off oxygen--  -IS, supp O2 if needed    -tolerating activities with therapy 14. 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  95. H/o CVA: Loss of STM and vision.  16. Subcutaneous emphysema of lower exts: most likely surgically related. Patient has no pain in limbs above baseline. Is making progress with therapies.   -legs normal in appearance otherwise, normal pulses  -observe for now. Surgery made aware, await recs    LOS: 5 days A FACE TO FACE EVALUATION WAS PERFORMED  Meredith Staggers 03/01/2018, 8:57 AM

## 2018-03-01 NOTE — Progress Notes (Signed)
Occupational Therapy Session Note  Patient Details  Name: Alexander [name] MRN: [id number] Date of Birth: 1940/12/01  Today's Date: 03/01/2018 OT Individual Time: 1100-1200 OT Individual Time Calculation (min): 60 min    Short Term Goals: Week 1:  OT Short Term Goal 1 (Week 1): Pt will complete bathing EOB with no more than 1 rest break OT Short Term Goal 2 (Week 1): Pt will don B socks with CGA OT Short Term Goal 3 (Week 1): Pt will transfer to [facility] with CGA OT Short Term Goal 4 (Week 1): Pt will don pants with CGA  Skilled Therapeutic Interventions/Progress Updates:    1:1. Wife present and no c/o pian. Pt supine>sitting EOB with VC for using of bed rail. Pt completes ambulation with rolator EOB>BSC<>TTB>w/c at sink iwht S-CGA with VC for pathfiding 2/2 low vision. Pt completes unable to have BM. Pt completes bathing sit to stand with CGA for standing only and VC for sequencing. Pt completes dressing seated on BSC with CGA for reaching forward to threadin BLE into LB clothing/footwear. Pt grooms at sink with supervision/VC seated in w/c. Exited session with pt seated in w/c, call light in reach and wife present in room supervising.   Therapy Documentation Precautions:  Precautions Precautions: Fall Precaution Comments: Pt is blind, can seen peripherally on the L slightly Restrictions Weight Bearing Restrictions: No General:   Vital Signs:  Pain: Pain Assessment Pain Score: 0-No pain Faces Pain Scale: No hurt ADL:   Vision   Perception    Praxis   Exercises:   Other Treatments:     Therapy/Group: Individual Therapy  Tonny Branch 03/01/2018, 12:02 PM

## 2018-03-01 NOTE — Progress Notes (Signed)
Physical Therapy Session Note  Patient Details  Name: Alexander [name] MRN: [id number] Date of Birth: 1940/06/16  Today's Date: 03/01/2018 PT Individual Time: 0910-0950 PT Individual Time Calculation (min): 40 min   Short Term Goals: Week 1:  PT Short Term Goal 1 (Week 1): Pt will complete transfers with min A consistently PT Short Term Goal 2 (Week 1): Pt will ambulate x 50 ft with LRAD and min A PT Short Term Goal 3 (Week 1): Pt will ascend/descend one 6" step with mod A  Skilled Therapeutic Interventions/Progress Updates:   Pt received sitting in WC. Pt eating breakfast and requested 10 minutes to finish. PT returned and pt agreeable to PT.   Pt transported to rehab gym in [facility]. Gait training with Rollator x 151f with close supervision assist from PT. CGA from PT with turn to prepare for stand>sit transfer. Additional gait training with rollator at end of treatment x 1278fwith supervision assist fading to min assist for last 5 ft due to fatigue and poor R knee control.    PT instructed pt in standing therex in parallel bars.  Marches x 12 BIL Hip abduction x 10 Bil Hip extension x 8 Bil  Calf raises x 10 Bil  Mini squats x 8  Min assist for safety throughout therex. Min cues from PT for improved posture, proper speed, and full ROM to improve strengthening aspects of movements.   [facility] reach and all needs met.         Therapy Documentation Precautions:  Precautions Precautions: Fall Precaution Comments: Pt is blind, can seen peripherally on the L slightly Restrictions Weight Bearing Restrictions: No   Pain: Pain Assessment Pain Scale: 0-10 Pain Score: 0-No pain   Therapy/Group: Individual Therapy  AuLorie Phenix2/06/2017, 2:16 PM

## 2018-03-01 NOTE — Progress Notes (Signed)
ANTICOAGULATION CONSULT NOTE - Follow-up Consult  Pharmacy Consult for warfarin Indication: atrial fibrillation  Allergies  Allergen Reactions  . Penicillins Swelling and Rash    Has patient had a PCN reaction causing immediate rash, facial/tongue/throat swelling, SOB or lightheadedness with hypotension: Yes Has patient had a PCN reaction causing severe rash involving mucus membranes or skin necrosis: No Has patient had a PCN reaction that required hospitalization: No Has patient had a PCN reaction occurring within the last 10 years: No If all of the above answers are "NO", then may proceed with Cephalosporin use.   . Atorvastatin     MYALGIA   . Codeine Nausea Only  . Tramadol Nausea Only    Patient Measurements: Height: 5\' 10"  (177.8 cm) Weight: 194 lb 3.6 oz (88.1 kg) IBW/kg (Calculated) : 73  Vital Signs: Temp: 98.1 F (36.7 C) (12/04 0435) BP: 145/96 (12/04 0435) Pulse Rate: 72 (12/04 0435)  Labs: Recent Labs    02/27/18 0606 02/27/18 1412 02/27/18 1413 02/28/18 0437 03/01/18 0503  HGB  --   --  9.1*  --   --   HCT  --   --  30.1*  --   --   PLT  --   --  207  --   --   LABPROT 31.3*  --   --  37.9* 35.1*  INR 3.08  --   --  3.94 3.56  CREATININE  --  8.99*  --   --   --     Estimated Creatinine Clearance: 7.7 mL/min (A) (by C-G formula based on SCr of 8.99 mg/dL (H)).  Assessment: 77 year old male with T2DM, ESRD on MWF, Afib on chronic coumadin, CAD, CVA with loss of short-term memory, macular degeneration/glaucoma. Patient is now 9 days s/p bilateral nephrectomy, cystoprosatatectomy.  PTA Warfarin dose is 2.5mg  MWF and 5mg  all other day.   INR 3.56 but down from yesterday.  Goal of Therapy:  INR 2-3 Monitor platelets by anticoagulation protocol: Yes   Plan:  Warfarin 1 mg x  1 Daily INR  Onnie Boer, PharmD, BCIDP, AAHIVP, CPP Infectious Disease Pharmacist 03/01/2018 9:03 AM

## 2018-03-01 NOTE — Progress Notes (Signed)
[facility] Progress Note   Subjective:  Seen in room, eating breakfast. Therapy going well. No CP, dyspnea, leg pains.  Objective Vitals:   02/28/18 1434 02/28/18 2302 03/01/18 0435 03/01/18 0443  BP: (!) 117/48 (!) 123/108 (!) 145/96   Pulse: 74 60 72   Resp: 20 20 18    Temp:  98.1 F (36.7 C) 98.1 F (36.7 C)   TempSrc:      SpO2: 100% 96% 96%   Weight:    88.1 kg  Height:       Physical Exam General: Well appearing man, NAD. Sitting in wheelchair. Heart: RRR; no murmur Lungs: CTAB Abdomen: soft, multiple healing incisions and scattered bruising Extremities: Persistent crepitus along L calf and tracking up lateral leg to mid thigh, as well as in R upper calf around knee. 1+ BLE pitting edema Dialysis Access: L AVF + thrill  Additional Objective Labs: Basic Metabolic Panel: Recent Labs  Lab 02/24/18 0813 02/24/18 1530 02/25/18 0619 02/27/18 1412  NA 131* 134* 131* 130*  K 4.0 3.8 4.0 4.5  CL 94* 97* 91* 92*  CO2 23 28 26  20*  GLUCOSE 132* 111* 117* 112*  BUN 67* 29* 42* 114*  CREATININE 8.28* 4.39* 5.64* 8.99*  CALCIUM 7.8* 7.9* 7.9* 7.3*  PHOS 7.0*  --   --  6.9*   Liver Function Tests: Recent Labs  Lab 02/24/18 0813 02/25/18 0619 02/27/18 1412  AST  --  24  --   ALT  --  13  --   ALKPHOS  --  61  --   BILITOT  --  1.2  --   PROT  --  5.0*  --   ALBUMIN 2.4* 2.2* 2.2*   CBC: Recent Labs  Lab 02/24/18 0812 02/25/18 0619 02/26/18 0532 02/27/18 1413  WBC 10.8* 10.4 9.9 9.2  NEUTROABS  --  8.9*  --   --   HGB 9.9* 9.6* 9.3* 9.1*  HCT 32.7* 31.0* 30.2* 30.1*  MCV 95.6 94.8 94.4 95.3  PLT 214 184 178 207   Medications:  . bromocriptine  5 mg Oral QODAY  . Chlorhexidine Gluconate Cloth  6 each Topical Q0600  . cinacalcet  60 mg Oral Q supper  . darbepoetin (ARANESP) injection - DIALYSIS  150 mcg Intravenous Q Mon-HD  . digoxin  62.5 mcg Oral Daily  . docusate sodium  100 mg Oral BID  . dorzolamide-timolol  1 drop Both Eyes  BID  . doxercalciferol  3 mcg Intravenous Q M,W,F-HD  . feeding supplement (PRO-STAT SUGAR FREE 64)  30 mL Oral BID  . gabapentin  200 mg Oral QHS  . latanoprost  1 drop Both Eyes QHS  . multivitamin  1 tablet Oral QHS  . pantoprazole  40 mg Oral Daily  . polyethylene glycol  17 g Oral Daily  . pravastatin  10 mg Oral Daily  . predniSONE  5 mg Oral QHS  . senna-docusate  2 tablet Oral QHS  . sertraline  50 mg Oral Daily  . sevelamer carbonate  1,600 mg Oral TID WC  . warfarin  1 mg Oral ONCE-1800  . Warfarin - Pharmacist Dosing Inpatient   Does not apply q1800    Dialysis Orders: MWF NW 4h 86.5kg 2/2 bath Hep 2800 Qb 450 L RC AVF - Hectoral80mcg IV q HD - Mircera 150 q 2wks(last given 11/20)  Assessment/Plan: 1. Bladder cancer: S/pcystoprostatectomy + bilateral nephrectomy 11/22, pathshowing "infiltrative high grade urothelial carcinoma with micropapillary and mucinous differentiation involving  entire bladder"; resected LNs all benign, ureters/kidneys without cancerous changes. Per urology. 2. BLE crepitus/subcutaneous emphysema: New on exam 12/3 - Rehab providers aware, surgery input pending. Felt to be related to recent surgery although not present immediately after surgery. Primary team feels no imaging necessary. No erythema, pain. Perfused legs, afebrile. Monitor. 3. ESRD:Continue HD per usual MWF schedule, next later today. 4. Anemia due to CKD + ABL: S/p 2U PRBCs11/26, 1U on11/23.Hgb 9.1, Aranesp q Monday. 5. MBD-CKD: Resumed phosphorus binders/sensiparand Hectorol with dialysis. 6. Afib, HxPPM: HR stable. Back on warfarin.  7. Debility: Admitted to CIR on11/29 8. Blindness 9. HxTAVR 10. MBO:BOFPU cath 2018 minimal CAD < 30% stenosis 10. Nutrition: Alb low, continue supplements and liberalized diet. 11. HTN/volume: BP ok, + LE edema. Prior EDW still seems appropriate.  Veneta Penton, PA-C 03/01/2018, 9:29 AM  [facility] Pager:  [phone]

## 2018-03-01 NOTE — Progress Notes (Signed)
Occupational Therapy Session Note  Patient Details  Name: GARTH DIFFLEY MRN: [id number] Date of Birth: 1940/10/09  Today's Date: 03/01/2018 OT Individual Time: 1030-1100 OT Individual Time Calculation (min): 30 min    Short Term Goals: Week 1:  OT Short Term Goal 1 (Week 1): Pt will complete bathing EOB with no more than 1 rest break OT Short Term Goal 2 (Week 1): Pt will don B socks with CGA OT Short Term Goal 3 (Week 1): Pt will transfer to [facility] with CGA OT Short Term Goal 4 (Week 1): Pt will don pants with CGA  Skilled Therapeutic Interventions/Progress Updates:    Session focused on functional mobility, with an emphasis on activity pacing and self-identifying needs for rest break for fall prevention. Pt completed 150 ft of functional mobility using rollator with min A x2 trials. Moderate cueing for upright posture and core activation. Min cueing required for seated rest break and rollator management for safety. Pt was left supine in bed with all needs met, bed alarm set and wife present.   Therapy Documentation Precautions:  Precautions Precautions: Fall Precaution Comments: Pt is blind, can seen peripherally on the L slightly Restrictions Weight Bearing Restrictions: No  Pain: Pain Assessment Pain Scale: 0-10 Pain Score: 0-No pain Faces Pain Scale: No hurt   Therapy/Group: Individual Therapy  Curtis Sites 03/01/2018, 12:19 PM

## 2018-03-01 NOTE — Procedures (Signed)
Patient seen on Hemodialysis. QB 400, UF goal 2L No complaints at HD  Elmarie Shiley MD [facility]. Office # [phone] Pager # [phone] 2:41 PM

## 2018-03-02 ENCOUNTER — Inpatient Hospital Stay (HOSPITAL_COMMUNITY): Payer: Medicare Other | Admitting: Occupational Therapy

## 2018-03-02 ENCOUNTER — Inpatient Hospital Stay (HOSPITAL_COMMUNITY): Payer: Medicare Other | Admitting: Physical Therapy

## 2018-03-02 LAB — PROTIME-INR
INR: 3.42
Prothrombin Time: 34 seconds — ABNORMAL HIGH (ref 11.4–15.2)

## 2018-03-02 LAB — GLUCOSE, CAPILLARY: Glucose-Capillary: 116 mg/dL — ABNORMAL HIGH (ref 70–99)

## 2018-03-02 MED ORDER — WARFARIN 0.5 MG HALF TABLET
0.5000 mg | ORAL_TABLET | Freq: Once | ORAL | Status: AC
  Administered 2018-03-02: 0.5 mg via ORAL
  Filled 2018-03-02: qty 1

## 2018-03-02 NOTE — Progress Notes (Signed)
ANTICOAGULATION CONSULT NOTE - Follow-up Consult  Pharmacy Consult for warfarin Indication: atrial fibrillation  Allergies  Allergen Reactions  . Penicillins Swelling and Rash    Has patient had a PCN reaction causing immediate rash, facial/tongue/throat swelling, SOB or lightheadedness with hypotension: Yes Has patient had a PCN reaction causing severe rash involving mucus membranes or skin necrosis: No Has patient had a PCN reaction that required hospitalization: No Has patient had a PCN reaction occurring within the last 10 years: No If all of the above answers are "NO", then may proceed with Cephalosporin use.   . Atorvastatin     MYALGIA   . Codeine Nausea Only  . Tramadol Nausea Only    Patient Measurements: Height: 5\' 10"  (177.8 cm) Weight: 189 lb 13.1 oz (86.1 kg) IBW/kg (Calculated) : 73  Vital Signs: Temp: 97.7 F (36.5 C) (12/05 0616) BP: 120/53 (12/05 0616) Pulse Rate: 76 (12/05 0837)  Labs: Recent Labs    02/27/18 1412 02/27/18 1413 02/28/18 0437 03/01/18 0503 03/01/18 1518 03/01/18 1523 03/02/18 0646  HGB  --  9.1*  --   --   --  9.0*  --   HCT  --  30.1*  --   --   --  29.3*  --   PLT  --  207  --   --   --  217  --   LABPROT  --   --  37.9* 35.1*  --   --  34.0*  INR  --   --  3.94 3.56  --   --  3.42  CREATININE 8.99*  --   --   --  7.12*  --   --     Estimated Creatinine Clearance: 9 mL/min (A) (by C-G formula based on SCr of 7.12 mg/dL (H)).  Assessment: 77 year old male with T2DM, ESRD on MWF, Afib on chronic coumadin, CAD, CVA with loss of short-term memory, macular degeneration/glaucoma. Patient is now 9 days s/p bilateral nephrectomy, cystoprosatatectomy.  PTA Warfarin dose is 2.5mg  MWF and 5mg  all other day.   INR 3.42 and trending down after 2 lower doses of coumadin. We will use even a lower dose today.   Goal of Therapy:  INR 2-3 Monitor platelets by anticoagulation protocol: Yes   Plan:  Warfarin 0.5 mg x  1 Daily INR  Onnie Boer, PharmD, BCIDP, AAHIVP, CPP Infectious Disease Pharmacist 03/02/2018 8:53 AM

## 2018-03-02 NOTE — Progress Notes (Signed)
Occupational Therapy Discharge Summary  Patient Details  Name: Alexander [name] MRN: [id number] Date of Birth: [birth year]  Patient has met 9 of 9 long term goals due to improved activity tolerance, improved balance and postural control.  Patient to discharge at overall Supervision level.  Patient's care partner is independent to provide the necessary physical assistance at discharge.    Reasons goals not met: n/a  Recommendation:  Patient will benefit from ongoing skilled OT services in home health setting to continue to advance functional skills in the area of BADL.  Equipment: 3-in-1 BSC  Reasons for discharge: treatment goals met and discharge from hospital  Patient/family agrees with progress made and goals achieved: Yes  OT Discharge Precautions/Restrictions  Precautions Precautions: Fall Precaution Comments: Pt is blind, can seen peripherally on the L slightly Restrictions Weight Bearing Restrictions: No Pain Pain Assessment Pain Scale: 0-10 Pain Score: 0-No pain ADL ADL Eating: Set up Grooming: Setup Upper Body Bathing: Supervision/safety Lower Body Bathing: Supervision/safety Upper Body Dressing: Supervision/safety Lower Body Dressing: Supervision/safety Toileting: Supervision/safety Toilet Transfer: Close supervision Toilet Transfer Method: Counselling psychologist: Grab bars Tub/Shower Transfer: Close supervison Clinical cytogeneticist Method: Optometrist: Special educational needs teacher  Perception: Within Functional Limits Praxis Praxis: Intact Cognition Orientation Level: Oriented to person;Oriented to place;Oriented to situation Sustained Attention: Appears intact Safety/Judgment: Impaired Comments: Some decreased awareness of deficits. Reccomending 24 hour supervision for safety Sensation Sensation Light Touch: Appears Intact Hot/Cold: Appears Intact Proprioception: Appears Intact Coordination Gross Motor Movements are  Fluid and Coordinated: No Fine Motor Movements are Fluid and Coordinated: Yes Coordination and Movement Description: WFL Motor  Motor Motor: Other (comment) Motor - Discharge Observations: generalized weakness, improved since eval Mobility  Bed Mobility Bed Mobility: Sit to Supine Sit to Supine: Supervision/Verbal cueing Transfers Sit to Stand: Supervision/Verbal cueing Stand to Sit: Supervision/Verbal cueing  Trunk/Postural Assessment  Cervical Assessment Cervical Assessment: Exceptions to WFL(forward head) Thoracic Assessment Thoracic Assessment: Exceptions to WFL(rounded shoulders) Lumbar Assessment Lumbar Assessment: Exceptions to WFL(posterior pelvic tilt)  Balance Balance Balance Assessed: Yes Static Sitting Balance Static Sitting - Balance Support: Feet supported;No upper extremity supported Static Sitting - Level of Assistance: 7: Independent Dynamic Sitting Balance Dynamic Sitting - Balance Support: Feet supported;No upper extremity supported Dynamic Sitting - Level of Assistance: 7: Independent Static Standing Balance Static Standing - Balance Support: During functional activity Static Standing - Level of Assistance: 5: Stand by assistance Dynamic Standing Balance Dynamic Standing - Balance Support: During functional activity Dynamic Standing - Level of Assistance: 5: Stand by assistance Extremity/Trunk Assessment RUE Assessment RUE Assessment: Within Functional Limits General Strength Comments: PLOF with AROM limitations, strength 4/5 LUE Assessment LUE Assessment: Within Functional Limits General Strength Comments: PLOF with AROM limitations, strength 4/5   Daneen Schick Dyonna Jaspers 03/02/2018, 12:47 PM

## 2018-03-02 NOTE — Progress Notes (Signed)
Occupational Therapy Session Note  Patient Details  Name: Alexander [name] MRN: [id number] Date of Birth: 1940-07-28  Today's Date: 03/02/2018 OT Individual Time: 1330-1400 OT Individual Time Calculation (min): 30 min    Short Term Goals: Week 1:  OT Short Term Goal 1 (Week 1): Pt will complete bathing EOB with no more than 1 rest break OT Short Term Goal 2 (Week 1): Pt will don B socks with CGA OT Short Term Goal 3 (Week 1): Pt will transfer to [facility] with CGA OT Short Term Goal 4 (Week 1): Pt will don pants with CGA  Skilled Therapeutic Interventions/Progress Updates:    Patient in bed upon arrival with wife present.  He states that he is ready for therapy.  Bed mobility and SPT with RW at supervision level, ambulation on the unit with CS occ cues for direction but he is able to see large obstacles in his environment.   Completed standing OH reach and trunk mobility activity with CS/CG with focus on balance and posture.  Patient demonstrates good recall and awareness of his needs.  He returned to bed for a rest break at the close of the session with bed alarm set.    Therapy Documentation Precautions:  Precautions Precautions: Fall Precaution Comments: Pt is blind, can seen peripherally on the L slightly Restrictions Weight Bearing Restrictions: No General:   Vital Signs: Therapy Vitals Temp: 97.7 F (36.5 C) Temp Source: Oral Pulse Rate: 61 Resp: 18 BP: 104/72 Patient Position (if appropriate): Lying Oxygen Therapy SpO2: 90 % O2 Device: Room Air Pain: Pain Assessment Pain Scale: 0-10 Pain Score: 2    Therapy/Group: Individual Therapy  Carlos Levering 03/02/2018, 3:41 PM

## 2018-03-02 NOTE — Progress Notes (Signed)
Physical Therapy Session Note  Patient Details  Name: VALON GLASSCOCK MRN: [id number] Date of Birth: April 08, 1940  Today's Date: 03/02/2018 PT Individual Time: 0730-0825 PT Individual Time Calculation (min): 55 min   Short Term Goals: Week 1:  PT Short Term Goal 1 (Week 1): Pt will complete transfers with min A consistently PT Short Term Goal 2 (Week 1): Pt will ambulate x 50 ft with LRAD and min A PT Short Term Goal 3 (Week 1): Pt will ascend/descend one 6" step with mod A  Skilled Therapeutic Interventions/Progress Updates:    pt rec'd in bed and agreeable to therapy.  Gait throughout unit with rollator with supervision, pt able to gait 150' before needing to sit due to LE fatigue. Pt able to perform turning to sit in rollator safely with supervision.  Step up training to simulate home entry with pt able to perform 1 step up with rollator multiple attempts with min A.  [place] exercise program performed with 2# wts bilaterally with pt with good activity tolerance for these exercises. Pt left in room with needs at hand, alarm set.  Therapy Documentation Precautions:  Precautions Precautions: Fall Precaution Comments: Pt is blind, can seen peripherally on the L slightly Restrictions Weight Bearing Restrictions: No Pain:  no c/o pain   Therapy/Group: Individual Therapy  Makaria Poarch 03/02/2018, 8:27 AM

## 2018-03-02 NOTE — Progress Notes (Signed)
[facility] Progress Note   Subjective: Seen in room. No new complaints. Per PA, will be discharged home tomorrow afternoon, will plan on HD early AM. No CP/dyspnea or leg pains.    Objective Vitals:   03/01/18 1936 03/02/18 0616 03/02/18 0837 03/02/18 1414  BP: (!) 96/47 (!) 120/53  104/72  Pulse: 62 60 76 61  Resp: 18 18  18   Temp: 97.6 F (36.4 C) 97.7 F (36.5 C)  97.7 F (36.5 C)  TempSrc:    Oral  SpO2: 98% 99%  90%  Weight:  86.1 kg    Height:       Physical Exam General:Well appearing man, NAD. Heart:RRR; no murmur Lungs:CTAB Abdomen:soft, multiple healing incisions and scattered bruising Extremities:Persistent BLE crepitus in B lower legs. Dialysis Access:L AVF + thrill  Additional Objective Labs: Basic Metabolic Panel: Recent Labs  Lab 02/24/18 0813  02/25/18 0619 02/27/18 1412 03/01/18 1518  NA 131*   < > 131* 130* 131*  K 4.0   < > 4.0 4.5 4.4  CL 94*   < > 91* 92* 93*  CO2 23   < > 26 20* 24  GLUCOSE 132*   < > 117* 112* 84  BUN 67*   < > 42* 114* 68*  CREATININE 8.28*   < > 5.64* 8.99* 7.12*  CALCIUM 7.8*   < > 7.9* 7.3* 7.9*  PHOS 7.0*  --   --  6.9* 4.6   < > = values in this interval not displayed.   Liver Function Tests: Recent Labs  Lab 02/25/18 0619 02/27/18 1412 03/01/18 1518  AST 24  --   --   ALT 13  --   --   ALKPHOS 61  --   --   BILITOT 1.2  --   --   PROT 5.0*  --   --   ALBUMIN 2.2* 2.2* 2.2*   CBC: Recent Labs  Lab 02/24/18 0812 02/25/18 0619 02/26/18 0532 02/27/18 1413 03/01/18 1523  WBC 10.8* 10.4 9.9 9.2 10.5  NEUTROABS  --  8.9*  --   --   --   HGB 9.9* 9.6* 9.3* 9.1* 9.0*  HCT 32.7* 31.0* 30.2* 30.1* 29.3*  MCV 95.6 94.8 94.4 95.3 94.5  PLT 214 184 178 207 217   Medications:  . bromocriptine  5 mg Oral QODAY  . Chlorhexidine Gluconate Cloth  6 each Topical Q0600  . cinacalcet  60 mg Oral Q supper  . darbepoetin (ARANESP) injection - DIALYSIS  150 mcg Intravenous Q Mon-HD  .  digoxin  62.5 mcg Oral Daily  . docusate sodium  100 mg Oral BID  . dorzolamide-timolol  1 drop Both Eyes BID  . doxercalciferol  3 mcg Intravenous Q M,W,F-HD  . feeding supplement (PRO-STAT SUGAR FREE 64)  30 mL Oral BID  . gabapentin  200 mg Oral QHS  . latanoprost  1 drop Both Eyes QHS  . multivitamin  1 tablet Oral QHS  . pantoprazole  40 mg Oral Daily  . polyethylene glycol  17 g Oral Daily  . pravastatin  10 mg Oral Daily  . predniSONE  5 mg Oral QHS  . senna-docusate  2 tablet Oral QHS  . sertraline  50 mg Oral Daily  . sevelamer carbonate  1,600 mg Oral TID WC  . warfarin  0.5 mg Oral ONCE-1800  . Warfarin - Pharmacist Dosing Inpatient   Does not apply q1800    Dialysis Orders: MWF NW 4h 86.5kg 2/2  bath Hep 2800 Qb 450 L RC AVF - Hectoral77mcg IV q HD - Mircera 150 q 2wks(last given 11/20)  Assessment/Plan: 1. Bladder cancer: S/pcystoprostatectomy + bilateral nephrectomy 11/22, pathshowing "infiltrative high grade urothelial carcinoma with micropapillary and mucinous differentiation involving entire bladder"; resected LNs all benign, ureters/kidneys without cancerous changes. Per urology. 2. BLE crepitus/subcutaneous emphysema: New on exam 12/3 - Rehab providers aware, surgery input pending. Felt to be related to recent surgery although not present immediately after surgery. Primary team feels no imaging necessary. No erythema, pain. Perfused legs, afebrile. Monitor. 3. ESRD:Continue HD per usual MWF schedule, next 12/6. 4. Anemia due to CKD + ABL: S/p 2U PRBCs11/26, 1U on11/23.Hgb 9.1, Aranespq Monday. 5. MBD-CKD: Resumed phosphorus binders/sensiparand Hectorol with dialysis. 6. Afib, HxPPM: HR stable. Back on warfarin.  7. Debility: Admitted to CIR on11/29 8. Blindness 9. HxTAVR 10. OFV:WAQLR cath 2018 minimal CAD < 30% stenosis 10. Nutrition: Alb low, continue supplements and liberalized diet. 11. HTN/volume: BP ok, edema improved.   Veneta Penton, PA-C 03/02/2018, 2:42 PM  [facility] Pager: [phone]

## 2018-03-02 NOTE — Progress Notes (Signed)
Social Work Patient ID: Alexander [name], male   DOB: 1941/03/10, 77 y.o.   MRN: [id number]  Pt and wife pleased with change in originally targeted d/c to 12/6 and wife feels comfortable providing supervison - min assist overall. Pt has been placed on AM HD session to complete prior to d/c home.  Jiayi Lengacher, LCSW

## 2018-03-02 NOTE — Plan of Care (Signed)
  Problem: Consults Goal: RH GENERAL PATIENT EDUCATION Description See Patient Education module for education specifics. Outcome: Progressing Goal: Skin Care Protocol Initiated - if Braden Score 18 or less Description If consults are not indicated, leave blank or document N/A Outcome: Progressing Goal: Diabetes Guidelines if Diabetic/Glucose > 140 Description If diabetic or lab glucose is > 140 mg/dl - Initiate Diabetes/Hyperglycemia Guidelines & Document Interventions  Outcome: Progressing   Problem: RH BOWEL ELIMINATION Goal: RH STG MANAGE BOWEL WITH ASSISTANCE Description STG Manage Bowel with moderate Assistance.  Outcome: Progressing Goal: RH STG MANAGE BOWEL W/MEDICATION W/ASSISTANCE Description STG Manage Bowel with Medication and with moderate Assistance.  Outcome: Progressing   Problem: RH SKIN INTEGRITY Goal: RH STG SKIN FREE OF INFECTION/BREAKDOWN Description Skin to remain free from infection and breakdown while on rehab with moderate assistance and using available skin care resources.  Outcome: Progressing   Problem: RH SAFETY Goal: RH STG ADHERE TO SAFETY PRECAUTIONS W/ASSISTANCE/DEVICE Description STG Adhere to Safety Precautions With min to mod Assistance and using appropriate assistive Device.  Outcome: Progressing   Problem: RH PAIN MANAGEMENT Goal: RH STG PAIN MANAGED AT OR BELOW PT'S PAIN GOAL Description Less than 3 on a 1-10 pain scale.  Outcome: Progressing   Problem: RH KNOWLEDGE DEFICIT GENERAL Goal: RH STG INCREASE KNOWLEDGE OF SELF CARE AFTER HOSPITALIZATION Description Patient and family will demonstrate knowledge of how to care for at discharge concerning medication management, dietary and fluid restrictions, and follow-up care with the physician with min assist from rehab staff.  Outcome: Progressing

## 2018-03-02 NOTE — Progress Notes (Signed)
Occupational Therapy Session Note  Patient Details  Name: Alexander [name] MRN: [id number] Date of Birth: 02/01/1941  Today's Date: 03/02/2018  Session 1 OT Individual Time: 4098-1191 OT Individual Time Calculation (min): 58 min   Session 2 OT Individual Time: 1448-1530 OT Individual Time Calculation (min): 42 min    Short Term Goals: Week 1:  OT Short Term Goal 1 (Week 1): Pt will complete bathing EOB with no more than 1 rest break OT Short Term Goal 2 (Week 1): Pt will don B socks with CGA OT Short Term Goal 3 (Week 1): Pt will transfer to [facility] with CGA OT Short Term Goal 4 (Week 1): Pt will don pants with CGA  Skilled Therapeutic Interventions/Progress Updates:    Session 1 Pt greeted semi-reclined in bed with spouse present and agreeable to OT treatment session. Discussed OT goals, POC, and dc plan with pt and spouse. Discussed home bathroom set-up and safety modifications for toilet and shower transfers. Pt already has a seat in walk-in shower. Pt ambulated to bathroom with RW and close supervision. Bathing completed with overall supervision and min verbal cues for safety. Dressing completed with set-up A to orient shirt 2/2 low vision and tagless shirt. Supervision for balance when standing to pull up pants. Pt left seated in wc at end of session with safety belt on and spouse present.   Session 2 Pt greeted asleep in bed, but easy to wake and agreeable to OT. Pt came to sitting EOB with supervision and donned shoes with increased time and rest breaks while tying shoes. Stand-pivot bed>wc with verbal cues for chair position 2/2 visual deficits. B UE strengthening with  5 mins x 3  With 3 minute rest breaks on [employer] arm bike.  Pt returned to room at end of session and requested to return to bed with supervision for stand-pivot.    Therapy Documentation Precautions:  Precautions Precautions: Fall Precaution Comments: Pt is blind, can seen peripherally on the L  slightly Restrictions Weight Bearing Restrictions: No Pain: Pain Assessment Pain Scale: 0-10 Pain Score: 0-No pain ADL: ADL Eating: Set up Grooming: Setup Upper Body Bathing: Supervision/safety Lower Body Bathing: Supervision/safety Upper Body Dressing: Supervision/safety Lower Body Dressing: Supervision/safety Toileting: Supervision/safety Toilet Transfer: Close supervision Toilet Transfer Method: Counselling psychologist: Grab bars Tub/Shower Transfer: Close supervison Tub/Shower Transfer Method: Ambulating Tub/Shower Equipment: Transfer tub bench   Therapy/Group: Individual Therapy  Valma Cava 03/02/2018, 4:02 PM

## 2018-03-02 NOTE — Progress Notes (Signed)
[facility] PHYSICAL MEDICINE [facility] REHABILITATION PROGRESS NOTE   Subjective/Complaints: No new complaints. Slept well. Tells me pain is controlled this morning  ROS: Patient denies fever, rash, sore throat, blurred vision, nausea, vomiting, diarrhea, cough, shortness of breath or chest pain, joint or back pain, headache, or mood change.    Objective:   No results found. Recent Labs    02/27/18 1413 03/01/18 1523  WBC 9.2 10.5  HGB 9.1* 9.0*  HCT 30.1* 29.3*  PLT 207 217   Recent Labs    02/27/18 1412 03/01/18 1518  NA 130* 131*  K 4.5 4.4  CL 92* 93*  CO2 20* 24  GLUCOSE 112* 84  BUN 114* 68*  CREATININE 8.99* 7.12*  CALCIUM 7.3* 7.9*    Intake/Output Summary (Last 24 hours) at 03/02/2018 1011 Last data filed at 03/02/2018 0839 Gross per 24 hour  Intake 560 ml  Output 0 ml  Net 560 ml     Physical Exam: Vital Signs Blood pressure (!) 120/53, pulse 76, temperature 97.7 F (36.5 C), resp. rate 18, height 5\' 10"  (1.778 m), weight 86.1 kg, SpO2 99 %. Constitutional: No distress . Vital signs reviewed. HEENT: EOMI, oral membranes moist Neck: supple Cardiovascular: RRR without murmur. No JVD    Respiratory: CTA Bilaterally without wheezes or rales. Normal effort    GI: BS +, non-tender, non-distended  Musculoskeletal: He exhibits edema (left thigh).  Edema and tenderness left flank. Minimal to no crepitus appreciated today Neurological: Oriented to person, place, month/year Motor: Right upper extremity/right lower extremity: 5/5 proximal distal Left upper extremity: 4+/5 proximal distal-stable motor Left lower extremity: Hip flexion 3+/5, knee extension 2+/5, ankle dorsiflexion 4/5 Right lower extremity: Hip flexion 4-/5, knee extension 3/5, ankle dorsiflexion 4+/5 Sensation diminished light touch left upper and left lower extremity--stable Skin: Skin is warm and dry.  Abdominal and flank wounds all closed,scarre/scabbed--no change, sutures in flank in  place Psychiatric: pleasant   Assessment/Plan: 1. Functional deficits secondary to debility which require 15 hours over 7 days of interdisciplinary therapy in a comprehensive inpatient rehab setting.  Physiatrist is providing close team supervision and 24 hour management of active medical problems listed below.  Physiatrist and rehab team continue to assess barriers to discharge/monitor patient progress toward functional and medical goals  Care Tool:  Bathing  Bathing activity did not occur: Safety/medical concerns(first day admission) Body parts bathed by patient: Right arm, Left arm, Chest, Abdomen, Face(UB only this am per pt request)         Bathing assist Assist Level: Supervision/Verbal cueing     Upper Body Dressing/Undressing Upper body dressing Upper body dressing/undressing activity did not occur (including orthotics): N/A What is the patient wearing?: Pull over shirt    Upper body assist Assist Level: Supervision/Verbal cueing    Lower Body Dressing/Undressing Lower body dressing    Lower body dressing activity did not occur: N/A What is the patient wearing?: Pants, Underwear/pull up     Lower body assist Assist for lower body dressing: Contact Guard/Touching assist     Toileting Toileting Toileting Activity did not occur (Clothing management and hygiene only): N/A (no void or bm)  Toileting assist Assist for toileting: Moderate Assistance - Patient 50 - 74%     Transfers Chair/bed transfer  Transfers assist  Chair/bed transfer activity did not occur: Safety/medical concerns  Chair/bed transfer assist level: Supervision/Verbal cueing     Locomotion Ambulation   Ambulation assist      Assist level: Supervision/Verbal cueing Assistive device:  Walker-rolling Max distance: 150   Walk 10 feet activity   Assist     Assist level: Supervision/Verbal cueing Assistive device: Walker-rolling   Walk 50 feet activity   Assist Walk 50 feet  with 2 turns activity did not occur: Safety/medical concerns  Assist level: Supervision/Verbal cueing Assistive device: Walker-rolling    Walk 150 feet activity   Assist Walk 150 feet activity did not occur: Safety/medical concerns  Assist level: Supervision/Verbal cueing Assistive device: Walker-rolling    Walk 10 feet on uneven surface  activity   Assist Walk 10 feet on uneven surfaces activity did not occur: Safety/medical concerns     Assistive device: Aeronautical engineer Will patient use wheelchair at discharge?: No             Wheelchair 50 feet with 2 turns activity    Assist            Wheelchair 150 feet activity     Assist           Medical Problem List and Plan: 1.  Deficits with mobility, transfers, self-care secondary to debility status post extensive urological procedures  --Continue CIR therapies including PT, OT   -changed to 15/7 intensity  2.  DVT Prophylaxis/Anticoagulation: Mechanical: Sequential compression devices, below knee Bilateral lower extremities 3. Chronic pain/Pain Management: Continue gabapentin. Controlled by tylenol prn 4. Mood: LCSW to follow for evaluation and support.  5. Neuropsych: This patient is somewhat capable of making decisions on his own behalf. 6. Skin/Wound Care: Routine pressure relief measures. Monitor wound daily for resolution of drainage.  7. Fluids/Electrolytes/Nutrition: Routine labs with HD. Renal diet with 1200 cc FR.  8. Bladder cancer s/p resection: Continue to monitor wound for healing. GU following for input.   9. ESRD: HD MWF at the end of the day to help with tolerance of therapy.   -daily weights ongoing with HD 10.  CAD s/p PPM for bradycardia: Continue to monitor BP/HR bid.  11.  Anemia of chronic disease:  H/H stable post 2 units on 11/26. Continue to monitor with serial check on HD days.  12.  Chronic A Fib: Heart rate controlled on lanoxin daily. Off  metoprolol and diltiazem at this time.   -coumadin resumed.  13. H/o Emphysema: Continue prednisone -- prn inhaler. Has been weaned off oxygen--  -IS, supp O2 if needed   -tolerating activities with therapy 14. 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  75. H/o CVA: Loss of STM and vision.  16. Subcutaneous emphysema of lower exts: most likely surgically related.   -seems largely resolved today. No pain     normal pulses  -observe for now. Surgery made aware     LOS: 6 days A FACE TO FACE EVALUATION WAS PERFORMED  Meredith Staggers 03/02/2018, 10:11 AM

## 2018-03-02 NOTE — Progress Notes (Signed)
Patient returned from hemodialysis during shift change. Was given in report from previous nurse that patient's fistula was bleeding in dialysis. During bedside shift report, noted some blood on his sheets & the bed rail. His dressing was intact, no bleeding noted. Approximately an hour later, patient's family called stating that the patient was c/o numbness to the left hand & possibly due to the dressing to the fistula being too tight. Dressing was assessed & was loosened due to the tape being wrapped around his arm. Dressing was not removed & tape was still holding. Patient was instructed to open & close his hand a few times & to give it a few minutes. Patient family called again in approximately a half hour & stated that the numbness was gone, but he was c/o pain to the area. Tylenol was obtained but before being able to give it, patient stated that he needed to go to the restroom. He was taken to the restroom by rolling walker & stated that he needed to sit for a while. Checked patient a few times, but finally noted him wiping himself. When this nurse went to assist, he had feces on his hands, on the commode, on his clothes & on the seat. Feces in the toilet seemed to be broken up as if it was removed in pieces. Patient was cleaned, clean clothes applied & transferred to the bed. No acute distress noted. No c/o dizziness noted. Medications were given as ordered & patient helped to a comfortable position with call light in place. Will continue to monitor.

## 2018-03-02 NOTE — Patient Care Conference (Signed)
Inpatient RehabilitationTeam Conference and Plan of Care Update Date: 02/28/2018   Time: 2:45 PM    Patient Name: Alexander [name]      Medical Record Number: [id number]  Date of Birth: 1940/12/31 Sex: Male         Room/Bed: 4W06C/4W06C-01 Payor Info: Payor: MEDICARE / Plan: MEDICARE PART A AND B / Product Type: *No Product type* /    Admitting Diagnosis: debility  Admit Date/Time:  02/24/2018  3:05 PM Admission Comments: No comment available   Primary Diagnosis:  Debility Principal Problem: Debility  Patient Active Problem List   Diagnosis Date Noted  . Debility 02/24/2018  . Hypoxia   . Acute on chronic anemia   . ESRD on dialysis ([place])   . Postoperative pain   . Chronic diastolic congestive heart failure ([place])   . Coronary artery disease involving native coronary artery of native heart without angina pectoris   . Status post placement of cardiac pacemaker   . History of CVA with residual deficit   . Diabetes mellitus type 2 in nonobese (HCC)   . Atrial fibrillation ([place])   . Bladder cancer ([place]) 02/17/2018  . Abnormal CT scan, colon   . Benign neoplasm of ascending colon   . Benign neoplasm of cecum   . Benign neoplasm of transverse colon   . Benign neoplasm of descending colon   . S/P TURP 12/14/2017  . Cancer of overlapping sites of bladder ([place]) 12/13/2017  . Post-operative complication 35/00/9381  . Balanitis 10/31/2017  . Failure to thrive syndrome, adult 10/30/2017  . Hematuria 10/21/2017  . UTI (urinary tract infection) 10/20/2017  . Syncope 05/12/2017  . Near syncope 05/12/2017  . Elevated troponin 05/12/2017  . CVA (cerebral vascular accident) ([place]) 04/12/2017  . Dialysis patient ([place]) 04/12/2017  . Seasonal allergies   . S/P epidural steroid injection   . Peptic ulcer   . Paroxysmal atrial fibrillation (HCC)   . Macular degeneration   . Legally blind   . Kidney stones   . Hypertension   . ESRD (end stage renal disease) ([place])   . Dyspnea   .  Diverticulosis   . Diabetes mellitus without complication ([place])   . Colon polyps   . Anemia   . Presence of permanent cardiac pacemaker 11/24/2016  . Atrial fibrillation with RVR ([place]) 11/23/2016  . Glaucoma 11/23/2016  . GERD (gastroesophageal reflux disease) 11/23/2016  . Hypomagnesemia 11/23/2016  . Diabetes mellitus, type II ([place]) 11/23/2016  . Depression 11/23/2016  . S/P TAVR (transcatheter aortic valve replacement) 08/31/2016  . Long term (current) use of anticoagulants [Z79.01] 08/13/2016  . Anticoagulated on Coumadin 01/19/2016  . Impacted cerumen of left ear 01/19/2016  . Otorrhagia of right ear 01/19/2016  . Sudden right hearing loss 01/19/2016  . Persistent atrial fibrillation   . Carotid occlusion, left 11/04/2015  . Carpal tunnel syndrome, bilateral 11/03/2015  . Diabetic peripheral neuropathy ([place]) 10/02/2015  . Orthostatic hypotension 09/09/2015  . Bradycardia 03/30/2015  . OAG (open angle glaucoma) suspect, high risk, left 05/28/2014  . Long-term (current) use of anticoagulants 04/08/2014  . Hyperlipidemia 04/04/2014  . Primary open-angle glaucoma 07/10/2013  . ESRD (end stage renal disease) on dialysis ([place]) 03/13/2013  . Pseudophakia 01/02/2013  . PVD (posterior vitreous detachment) 01/02/2013  . PAD (peripheral artery disease) ([place]) 12/12/2012  . Occlusion and stenosis of bilateral carotid arteries 08/17/2012  . Severe aortic stenosis 06/15/2012  . Aortic stenosis 06/15/2012  . Organ transplant candidate 01/20/2012  . Exudative macular degeneration (  [place]) 12/02/2011  . Retinal detachment 12/02/2011  . Hx of adenomatous colonic polyps 02/03/2011  . Esophageal reflux 02/03/2011  . Anemia, unspecified 02/03/2011  . Barrett's esophagus 05/28/2003  . Tubular adenoma of colon 07/27/2001    Expected Discharge Date: Expected Discharge Date: 03/04/18  Team Members Present: Physician leading conference: Dr. Alger Simons Social Worker Present: Lennart Pall,  LCSW Nurse Present: Dorthula Nettles, RN PT Present: Roderic Ovens, PT OT Present: Cherylynn Ridges, OT SLP Present: Weston Anna, SLP PPS Coordinator present : Daiva Nakayama, RN, CRRN;Melissa Gertie Fey     Current Status/Progress Goal Weekly Team Focus  Medical   debility after multiple medical. wounds on abdomen healing, respiratory status stable. cognitively close to baseline  improve activity tolerance  volume status/renal, wound care   Bowel/Bladder   Patient is continent B/B. LBM 02/26/2018.  Remain continent B/B and maintain regular bowel pattern.  Assist with toileting needs PRN.   Swallow/Nutrition/ Hydration             ADL's   CGA to min A with self care, min -mod A with ADL transfers depending on fatigue level. follows directions well.   Fatigues easiliy.  S overall with BADLs  ADL training, functional mobility, activity tolerance, pt/ family education   Mobility   min A transfers and short distance gait  supervision overall  activity tolerance, pt/family ed, gait with rollator   Communication             Safety/Cognition/ Behavioral Observations            Pain   Patient has no complaints of pain.  Remain free from pain.  Assess pain Q shift and PRN.   Skin   Patient has 6 port sites to the abdomen that are sealed with liquid skin; an incision midline abdomen that is OTA; a suture on the L abdomen that is OTA; a skin tear to the L lower back; weeping to the R lower abdomen; and the abdomen is swollen and bruised.  Maintain skin integrity and prevent skin breakdown.   Assess skin Q shift and PRN. Manage skin care per order.    Rehab Goals Patient on target to meet rehab goals: Yes *See Care Plan and progress notes for long and short-term goals.     Barriers to Discharge  Current Status/Progress Possible Resolutions Date Resolved   Physician    Medical stability               Nursing                  PT                    OT                  SLP                 SW                Discharge Planning/Teaching Needs:  Home with wife who can provide 24/7 supervision;  adult children also available to for intermittent assistance.  Wife here daily.   Team Discussion:  MD monitoring crepetis in leg;  Await surgery input as well on this.  Change in schedule to 15/7 due to decreased endurance and HD scheduling.  Supervision to min assist overall with supervision goals.  Revisions to Treatment Plan:  Radene Knee in tx schedule.    Continued Need for Acute Rehabilitation Level of Care:  The patient requires daily medical management by a physician with specialized training in physical medicine and rehabilitation for the following conditions: Daily direction of a multidisciplinary physical rehabilitation program to ensure safe treatment while eliciting the highest outcome that is of practical value to the patient.: Yes Daily medical management of patient stability for increased activity during participation in an intensive rehabilitation regime.: Yes Daily analysis of laboratory values and/or radiology reports with any subsequent need for medication adjustment of medical intervention for : Renal problems;Wound care problems   I attest that I was present, lead the team conference, and concur with the assessment and plan of the team.   Katelan Hirt 03/02/2018, 3:37 PM

## 2018-03-03 ENCOUNTER — Inpatient Hospital Stay (HOSPITAL_COMMUNITY): Payer: Medicare Other | Admitting: Physical Therapy

## 2018-03-03 ENCOUNTER — Other Ambulatory Visit: Payer: Self-pay | Admitting: Physical Medicine and Rehabilitation

## 2018-03-03 DIAGNOSIS — T8182XA Emphysema (subcutaneous) resulting from a procedure, initial encounter: Secondary | ICD-10-CM

## 2018-03-03 DIAGNOSIS — E1142 Type 2 diabetes mellitus with diabetic polyneuropathy: Secondary | ICD-10-CM

## 2018-03-03 LAB — RENAL FUNCTION PANEL
ALBUMIN: 2.1 g/dL — AB (ref 3.5–5.0)
Anion gap: 14 (ref 5–15)
BUN: 47 mg/dL — ABNORMAL HIGH (ref 8–23)
CO2: 27 mmol/L (ref 22–32)
CREATININE: 5.96 mg/dL — AB (ref 0.61–1.24)
Calcium: 8.3 mg/dL — ABNORMAL LOW (ref 8.9–10.3)
Chloride: 96 mmol/L — ABNORMAL LOW (ref 98–111)
GFR calc Af Amer: 10 mL/min — ABNORMAL LOW (ref >60)
GFR calc non Af Amer: 8 mL/min — ABNORMAL LOW (ref >60)
Glucose, Bld: 112 mg/dL — ABNORMAL HIGH (ref 70–99)
Phosphorus: 4.5 mg/dL (ref 2.5–4.6)
Potassium: 4.5 mmol/L (ref 3.5–5.1)
Sodium: 137 mmol/L (ref 135–145)

## 2018-03-03 LAB — CBC
HCT: 29.2 % — ABNORMAL LOW (ref 39.0–52.0)
Hemoglobin: 8.8 g/dL — ABNORMAL LOW (ref 13.0–17.0)
MCH: 29.2 pg (ref 26.0–34.0)
MCHC: 30.1 g/dL (ref 30.0–36.0)
MCV: 97 fL (ref 80.0–100.0)
Platelets: 259 10*3/uL (ref 150–400)
RBC: 3.01 MIL/uL — ABNORMAL LOW (ref 4.22–5.81)
RDW: 18.6 % — ABNORMAL HIGH (ref 11.5–15.5)
WBC: 9.4 10*3/uL (ref 4.0–10.5)
nRBC: 0 % (ref 0.0–0.2)

## 2018-03-03 LAB — GLUCOSE, CAPILLARY
GLUCOSE-CAPILLARY: 116 mg/dL — AB (ref 70–99)
Glucose-Capillary: 103 mg/dL — ABNORMAL HIGH (ref 70–99)

## 2018-03-03 LAB — PROTIME-INR
INR: 3.41
PROTHROMBIN TIME: 33.9 s — AB (ref 11.4–15.2)

## 2018-03-03 MED ORDER — DOCUSATE SODIUM 100 MG PO CAPS: 100.0000 mg | ORAL_CAPSULE | Freq: Two times a day (BID) | ORAL | 0 refills | Status: AC

## 2018-03-03 MED ORDER — WARFARIN SODIUM 1 MG PO TABS: 1.0000 mg | ORAL_TABLET | Freq: Every day | ORAL | 0 refills | Status: AC

## 2018-03-03 MED ORDER — HEPARIN SODIUM (PORCINE) 1000 UNIT/ML IJ SOLN
INTRAMUSCULAR | Status: AC
  Filled 2018-03-03: qty 3

## 2018-03-03 MED ORDER — POLYETHYLENE GLYCOL 3350 17 G PO PACK: 17.0000 g | PACK | Freq: Every day | ORAL | 0 refills | Status: DC

## 2018-03-03 MED ORDER — SENNOSIDES-DOCUSATE SODIUM 8.6-50 MG PO TABS: 2.0000 | ORAL_TABLET | Freq: Every day | ORAL | 0 refills | Status: AC

## 2018-03-03 MED ORDER — HEPARIN SODIUM (PORCINE) 1000 UNIT/ML DIALYSIS
20.0000 [IU]/kg | INTRAMUSCULAR | Status: DC | PRN
  Administered 2018-03-03: 2700 [IU] via INTRAVENOUS_CENTRAL
  Filled 2018-03-03: qty 1.7

## 2018-03-03 MED ORDER — DOXERCALCIFEROL 4 MCG/2ML IV SOLN
INTRAVENOUS | Status: AC
  Administered 2018-03-03: 3 ug via INTRAVENOUS
  Filled 2018-03-03: qty 2

## 2018-03-03 NOTE — Discharge Summary (Addendum)
Physician Discharge Summary  Patient ID: Alexander [name] MRN: [id number] DOB/AGE: 77-Nov-1942 77 y.o.  Admit date: 02/24/2018 Discharge date: 03/03/2018  Discharge Diagnoses:  Principal Problem:   Debility Active Problems:   Esophageal reflux   Diabetic peripheral neuropathy (HCC)   Bladder cancer (HCC)   Atrial fibrillation (HCC)   Acute on chronic anemia   ESRD on dialysis ([place])   Subcutaneous emphysema associated with surgical procedure   Discharged Condition: stable   Significant Diagnostic Studies: No results found.  Labs:  Basic Metabolic Panel: Recent Labs  Lab 02/24/18 1530 02/25/18 0619 02/27/18 1412 03/01/18 1518 03/03/18 0811  NA 134* 131* 130* 131* 137  K 3.8 4.0 4.5 4.4 4.5  CL 97* 91* 92* 93* 96*  CO2 28 26 20* 24 27  GLUCOSE 111* 117* 112* 84 112*  BUN 29* 42* 114* 68* 47*  CREATININE 4.39* 5.64* 8.99* 7.12* 5.96*  CALCIUM 7.9* 7.9* 7.3* 7.9* 8.3*  PHOS  --   --  6.9* 4.6 4.5    CBC: Recent Labs  Lab 02/25/18 0619  02/27/18 1413 03/01/18 1523 03/03/18 0811  WBC 10.4   < > 9.2 10.5 9.4  NEUTROABS 8.9*  --   --   --   --   HGB 9.6*   < > 9.1* 9.0* 8.8*  HCT 31.0*   < > 30.1* 29.3* 29.2*  MCV 94.8   < > 95.3 94.5 97.0  PLT 184   < > 207 217 259   < > = values in this interval not displayed.    CBG: Recent Labs  Lab 02/28/18 0624 03/01/18 0606 03/02/18 0618 03/03/18 0622 03/03/18 0801  GLUCAP 98 104* 116* 116* 103*    Lab Results  Component Value Date   INR 3.41 03/03/2018   INR 3.42 03/02/2018   INR 3.56 03/01/2018    Brief HPI:   Alexander [name] is a 77 year old male with history of T2DM, ESRD, CAF-chronic Coumadin, CVA with loss of short-term memory and worsening visual deficits, macular degeneration/glaucoma, recent diagnosis of aggressive bladder cancer who was admitted on 02/17/2018 for robotic assisted laparoscopic radial cystoprostatectomy, bilateral pelvic lymphadenectomy and bilateral nephroureterectomy by Dr. Leafy Ro.   Postop course significant for issues with hypotension, acute blood loss anemia, reports of weakness and numbness LLE greater than RLE, as well as serosanguineous drainage from left flank wound.  Urology recommended holding Coumadin until drainage resolved x24 hours.  He was noted to be debilitated and CIR recommended due to functional decline  Hospital Course: Alexander [name] was admitted to rehab 02/24/2018 for inpatient therapies to consist of PT and OT at least three hours five days a week. Past admission physiatrist, therapy team and rehab RN have worked together to provide customized collaborative inpatient rehab.  Hemodialysis has been ongoing on Monday Wednesday Friday at end of day to help with tolerance of therapy.  He was started on bowel program to help manage constipation.  Nutritional supplements were added to help promote wound healing.  Midline abdominal incision is clean dry and intact.  Diffuse ecchymosis bilateral back/flank area is resolving.  He was found to have subcutaneous emphysema r LLE> RLE without signs or symptoms of ischemia or pain.  Pulses noted to be intact.  Urology was made aware of situation and felt that this was to be expected.   Coumadin was resumed and pharmacy has been assisting with dosing and monitoring.  INR supratherapeutic at discharge and family was advised to hold Coumadin for 1 day then  resume at 1 mg daily.  INR to be repeated by cardiology on office visit on 12/10.  His blood pressures have been monitored on twice daily basis and have been stable without use of ProAmatine.  Heart rate has been monitored on twice daily basis and is controlled on Lanoxin alone.  Subcutaneous emphysema BLE has almost resolved.  Neuropathy BLE has been controlled with use of gabapentin.  Diabetes is monitored with ac/hs CBG checks and is controlled by diet. His mood has been stable and he has made steady progress during his rehab stay and is at supervision level.  He will continue to  receive further follow-up home health PT and OT by [facility] after discharge   Rehab course: During patient's stay in rehab team conference was held to discuss  patient's progress, barriers to discharge and to set goals.  At admission, patient required min assist with ADL tasks and mod assist with mobility. He  has had improvement in activity tolerance, balance, postural control as well as ability to compensate for deficits.  He is able to complete ADL tasks with supervision.  He requires supervision for transfers and able to ambulate 150 feet with rolling walker and cues.  Family education was completed regarding all aspects of care.    Disposition: Home  Diet: Renal diet/carb modified restrictions  Special Instructions: 1.  Needs supervision with activity. 2.  Monitor left flank for any recurrence of drainage.  Contact Dr. Leafy Ro in case of BLE swelling or pain. 3.  Needs protime rechecked on Tues-03/07/18.   Discharge Instructions    Ambulatory referral to Physical Medicine Rehab   Complete by:  As directed    1-2 weeks transitional care appt     Allergies as of 03/03/2018      Reactions   Penicillins Swelling, Rash   Has patient had a PCN reaction causing immediate rash, facial/tongue/throat swelling, SOB or lightheadedness with hypotension: Yes Has patient had a PCN reaction causing severe rash involving mucus membranes or skin necrosis: No Has patient had a PCN reaction that required hospitalization: No Has patient had a PCN reaction occurring within the last 10 years: No If all of the above answers are "NO", then may proceed with Cephalosporin use.   Atorvastatin    MYALGIA   Codeine Nausea Only   Tramadol Nausea Only      Medication List    STOP taking these medications   CARDIZEM CD 120 MG 24 hr capsule Generic drug:  diltiazem   clindamycin 150 MG capsule Commonly known as:  CLEOCIN   metoprolol tartrate 25 MG tablet Commonly known as:  LOPRESSOR    oxyCODONE-acetaminophen 5-325 MG tablet Commonly known as:  PERCOCET/ROXICET     TAKE these medications   acetaminophen 325 MG tablet Commonly known as:  TYLENOL Take 1-2 tablets (325-650 mg total) by mouth every 4 (four) hours as needed for mild pain.   b complex-vitamin c-folic acid 0.8 MG Tabs tablet Take 1 tablet by mouth daily.   bromocriptine 5 MG capsule Commonly known as:  PARLODEL Take 5 mg by mouth every other day.   cetirizine 10 MG tablet Commonly known as:  ZYRTEC Take 10 mg by mouth at bedtime.   cinacalcet 60 MG tablet Commonly known as:  SENSIPAR Take 60 mg by mouth at bedtime.   DIGOX 0.125 MG tablet Generic drug:  digoxin TAKE 1/2 TABLET BY MOUTH EVERY DAY What changed:  how much to take   docusate sodium 100 MG  capsule Commonly known as:  COLACE Take 1 capsule (100 mg total) by mouth 2 (two) times daily.   dorzolamide-timolol 22.3-6.8 MG/ML ophthalmic solution Commonly known as:  COSOPT Place 1 drop into both eyes 2 (two) times daily.   gabapentin 100 MG capsule Commonly known as:  NEURONTIN Take 200 mg by mouth at bedtime.   latanoprost 0.005 % ophthalmic solution Commonly known as:  XALATAN Place 1 drop into both eyes at bedtime.   NEPRO Liqd Take 237 mLs by mouth every Monday, Wednesday, and Friday with hemodialysis.   omeprazole 20 MG capsule Commonly known as:  PRILOSEC Take 20 mg by mouth daily.   polyethylene glycol packet Commonly known as:  MIRALAX / GLYCOLAX Take 17 g by mouth daily.   pravastatin 10 MG tablet Commonly known as:  PRAVACHOL Take 1 tablet (10 mg total) by mouth daily.   predniSONE 5 MG tablet Commonly known as:  DELTASONE Take 5 mg by mouth at bedtime.   senna-docusate 8.6-50 MG tablet Commonly known as:  Senokot-S Take 2 tablets by mouth at bedtime. What changed:    how much to take  when to take this  additional instructions   sertraline 50 MG tablet Commonly known as:  ZOLOFT Take 50 mg by  mouth daily.   sevelamer carbonate 800 MG tablet Commonly known as:  RENVELA Take 2 tablets (1,600 mg total) by mouth 3 (three) times daily with meals.   STIOLTO RESPIMAT 2.5-2.5 MCG/ACT Aers Generic drug:  Tiotropium Bromide-Olodaterol Inhale 2 puffs into the lungs daily as needed for shortness of breath.   warfarin 1 MG tablet Commonly known as:  COUMADIN Take as directed. If you are unsure how to take this medication, talk to your nurse or doctor. Original instructions:  Take 1 tablet (1 mg total) by mouth daily at 6 PM. Start tomorrow Start taking on:  03/04/2018      Follow-up Information    Meredith Staggers, MD Follow up.   Specialty:  Physical Medicine and Rehabilitation Why:  Office will call you with follow up appointment Contact information: [address] [place] [address] [phone]        Alexis Frock, MD Follow up.   Specialty:  Urology Contact information: [place] [address] [phone]        Deland Pretty, MD Follow up on 03/14/2018.   Specialty:  Internal Medicine Why:  @ 10:00 am (hospital follow up appt) Contact information: [place] [address] [phone]        Skeet Latch, MD .   Specialty:  Cardiology Contact information: [address] [place] [address] [phone]           Signed: Bary Leriche 03/03/2018, 1:37 PM

## 2018-03-03 NOTE — Plan of Care (Signed)
  Problem: Consults Goal: RH GENERAL PATIENT EDUCATION Description See Patient Education module for education specifics. Outcome: Progressing Goal: Skin Care Protocol Initiated - if Braden Score 18 or less Description If consults are not indicated, leave blank or document N/A Outcome: Progressing Goal: Diabetes Guidelines if Diabetic/Glucose > 140 Description If diabetic or lab glucose is > 140 mg/dl - Initiate Diabetes/Hyperglycemia Guidelines & Document Interventions  Outcome: Progressing   Problem: RH BOWEL ELIMINATION Goal: RH STG MANAGE BOWEL WITH ASSISTANCE Description STG Manage Bowel with moderate Assistance.  Outcome: Progressing Goal: RH STG MANAGE BOWEL W/MEDICATION W/ASSISTANCE Description STG Manage Bowel with Medication and with moderate Assistance.  Outcome: Progressing   Problem: RH SKIN INTEGRITY Goal: RH STG SKIN FREE OF INFECTION/BREAKDOWN Description Skin to remain free from infection and breakdown while on rehab with moderate assistance and using available skin care resources.  Outcome: Progressing   Problem: RH SAFETY Goal: RH STG ADHERE TO SAFETY PRECAUTIONS W/ASSISTANCE/DEVICE Description STG Adhere to Safety Precautions With min to mod Assistance and using appropriate assistive Device.  Outcome: Progressing   Problem: RH PAIN MANAGEMENT Goal: RH STG PAIN MANAGED AT OR BELOW PT'S PAIN GOAL Description Less than 3 on a 1-10 pain scale.  Outcome: Progressing   Problem: RH KNOWLEDGE DEFICIT GENERAL Goal: RH STG INCREASE KNOWLEDGE OF SELF CARE AFTER HOSPITALIZATION Description Patient and family will demonstrate knowledge of how to care for at discharge concerning medication management, dietary and fluid restrictions, and follow-up care with the physician with min assist from rehab staff.  Outcome: Progressing

## 2018-03-03 NOTE — Procedures (Signed)
Patient seen on Hemodialysis. BP 139/61   Pulse 67   Temp 97.9 F (36.6 C) (Oral)   Resp 18   Ht 5\' 10"  (1.778 m)   Wt 88.5 kg   SpO2 94%   BMI 27.99 kg/m   QB 400, UF goal 1L Tolerating treatment without complaints at this time.  Plans for DC home later today noted.   Elmarie Shiley MD [facility] Office # [phone] Pager # [phone] 9:43 AM

## 2018-03-03 NOTE — Progress Notes (Signed)
Physical Therapy Discharge Summary  Patient Details  Name: Alexander [name] MRN: [id number] Date of Birth: Mar 02, 1941   Patient has met 6 of 6 long term goals due to improved activity tolerance, improved balance, increased strength and ability to compensate for deficits.  Patient to discharge at an ambulatory level Supervision.   Patient's care partner is independent to provide the necessary supervision assistance at discharge.  Reasons goals not met: n/a  Recommendation:  Patient will benefit from ongoing skilled PT services in home health setting to continue to advance safe functional mobility, address ongoing impairments in strength, activity tolerance, balance, and minimize fall risk.  Equipment: No equipment provided  Reasons for discharge: treatment goals met and discharge from hospital  Patient/family agrees with progress made and goals achieved: Yes  PT Discharge Precautions/Restrictions Precautions Precautions: Fall Restrictions Weight Bearing Restrictions: No Pain Pain Assessment Pain Scale: 0-10 Pain Score: 0-No pain  Cognition Overall Cognitive Status: Within Functional Limits for tasks assessed Orientation Level: Oriented to person;Oriented to place;Oriented to situation;Disoriented to time Sensation Sensation Light Touch: Appears Intact Proprioception: Appears Intact Coordination Gross Motor Movements are Fluid and Coordinated: Yes Fine Motor Movements are Fluid and Coordinated: Yes Coordination and Movement Description: [facility] Motor  Motor Motor - Discharge Observations: generalized weakness, improved since eval  Mobility Bed Mobility Sit to Supine: Supervision/Verbal cueing Transfers Sit to Stand: Supervision/Verbal cueing Stand to Sit: Supervision/Verbal cueing Stand Pivot Transfers: Supervision/Verbal cueing Transfer (Assistive device): Rolling walker Locomotion  Gait Ambulation: Yes Gait Assistance: Supervision/Verbal cueing Gait Distance (Feet):  150 Feet Assistive device: 4-wheeled walker Stairs / Additional Locomotion Stairs: Yes Stairs Assistance: Minimal Assistance - Patient > 75% Stair Management Technique: With walker Number of Stairs: 1 Ramp: Supervision/Verbal cueing Wheelchair Mobility Wheelchair Mobility: No  Trunk/Postural Assessment  Cervical Assessment Cervical Assessment: (fwd head) Thoracic Assessment Thoracic Assessment: (mild kyphosis) Lumbar Assessment Lumbar Assessment: (posterior pelvic tilt) Postural Control Righting Reactions: delayed, posterior lean in standing  Balance Static Sitting Balance Static Sitting - Level of Assistance: 7: Independent Dynamic Sitting Balance Dynamic Sitting - Level of Assistance: 7: Independent Static Standing Balance Static Standing - Level of Assistance: 5: Stand by assistance Dynamic Standing Balance Dynamic Standing - Level of Assistance: 5: Stand by assistance Extremity Assessment      RLE Assessment General Strength Comments: grossly 4/5 LLE Assessment General Strength Comments: grossly 4/5    Issiac Jamar 03/03/2018, 8:22 AM

## 2018-03-03 NOTE — Discharge Instructions (Signed)
Inpatient Rehab Discharge Instructions  Alexander [name] Discharge date and time:    Activities/Precautions/ Functional Status: Activity: no lifting, driving, or strenuous exercise for till cleared by MD Diet: renal diet Wound Care: keep wound clean and dry    Functional status:  ___ No restrictions     ___ Walk up steps independently _X_ 24/7 supervision/assistance   ___ Walk up steps with assistance ___ Intermittent supervision/assistance  ___ Bathe/dress independently ___ Walk with walker     _X__ Bathe/dress with assistance ___ Walk Independently    ___ Shower independently ___ Walk with assistance    ___ Shower with assistance _X__ No alcohol     ___ Return to work/school ________   COMMUNITY REFERRALS UPON DISCHARGE:    Home Health:   PT     OT                      Agency:  [facility]   Phone:  [phone]   Medical Equipment/Items Ordered:  Commode                                                      Agency/Supplier:  [facility] @ [phone]      Special Instructions:    My questions have been answered and I understand these instructions. I will adhere to these goals and the provided educational materials after my discharge from the hospital.  Patient/Caregiver Signature _______________________________ Date __________  Clinician Signature _______________________________________ Date __________  Please bring this form and your medication list with you to all your follow-up doctor's appointments.   Information on my medicine - Coumadin   (Warfarin)  This medication education was reviewed with me or my healthcare representative as part of my discharge preparation.  The pharmacist that spoke with me during my hospital stay was:  Onnie Boer, RPH-CPP  Why was Coumadin prescribed for you? Coumadin was prescribed for you because you have a blood clot or a medical condition that can cause an increased risk of forming blood clots. Blood clots can cause  serious health problems by blocking the flow of blood to the heart, lung, or brain. Coumadin can prevent harmful blood clots from forming. As a reminder your indication for Coumadin is:   Stroke Prevention Because Of Atrial Fibrillation  What test will check on my response to Coumadin? While on Coumadin (warfarin) you will need to have an INR test regularly to ensure that your dose is keeping you in the desired range. The INR (international normalized ratio) number is calculated from the result of the laboratory test called prothrombin time (PT).  If an INR APPOINTMENT HAS NOT ALREADY BEEN MADE FOR YOU please schedule an appointment to have this lab work done by your health care provider within 7 days. Your INR goal is usually a number between:  2 to 3 or your provider may give you a more narrow range like 2-2.5.  Ask your health care provider during an office visit what your goal INR is.  What  do you need to  know  About  COUMADIN? Take Coumadin (warfarin) exactly as prescribed by your healthcare provider about the same time each day.  DO NOT stop taking without talking to the doctor who prescribed the medication.  Stopping without other blood clot prevention medication to take  the place of Coumadin may increase your risk of developing a new clot or stroke.  Get refills before you run out.  What do you do if you miss a dose? If you miss a dose, take it as soon as you remember on the same day then continue your regularly scheduled regimen the next day.  Do not take two doses of Coumadin at the same time.  Important Safety Information A possible side effect of Coumadin (Warfarin) is an increased risk of bleeding. You should call your healthcare provider right away if you experience any of the following: ? Bleeding from an injury or your nose that does not stop. ? Unusual colored urine (red or dark brown) or unusual colored stools (red or black). ? Unusual bruising for unknown reasons. ? A serious  fall or if you hit your head (even if there is no bleeding).  Some foods or medicines interact with Coumadin (warfarin) and might alter your response to warfarin. To help avoid this: ? Eat a balanced diet, maintaining a consistent amount of Vitamin K. ? Notify your provider about major diet changes you plan to make. ? Avoid alcohol or limit your intake to 1 drink for women and 2 drinks for men per day. (1 drink is 5 oz. wine, 12 oz. beer, or 1.5 oz. liquor.)  Make sure that ANY health care provider who prescribes medication for you knows that you are taking Coumadin (warfarin).  Also make sure the healthcare provider who is monitoring your Coumadin knows when you have started a new medication including herbals and non-prescription products.  Coumadin (Warfarin)  Major Drug Interactions  Increased Warfarin Effect Decreased Warfarin Effect  Alcohol (large quantities) Antibiotics (esp. Septra/Bactrim, Flagyl, Cipro) Amiodarone (Cordarone) Aspirin (ASA) Cimetidine (Tagamet) Megestrol (Megace) NSAIDs (ibuprofen, naproxen, etc.) Piroxicam (Feldene) Propafenone (Rythmol SR) Propranolol (Inderal) Isoniazid (INH) Posaconazole (Noxafil) Barbiturates (Phenobarbital) Carbamazepine (Tegretol) Chlordiazepoxide (Librium) Cholestyramine (Questran) Griseofulvin Oral Contraceptives Rifampin Sucralfate (Carafate) Vitamin K   Coumadin (Warfarin) Major Herbal Interactions  Increased Warfarin Effect Decreased Warfarin Effect  Garlic Ginseng Ginkgo biloba Coenzyme Q10 Green tea St. Johns wort    Coumadin (Warfarin) FOOD Interactions  Eat a consistent number of servings per week of foods HIGH in Vitamin K (1 serving =  cup)  Collards (cooked, or boiled & drained) Kale (cooked, or boiled & drained) Mustard greens (cooked, or boiled & drained) Parsley *serving size only =  cup Spinach (cooked, or boiled & drained) Swiss chard (cooked, or boiled & drained) Turnip greens (cooked, or  boiled & drained)  Eat a consistent number of servings per week of foods MEDIUM-HIGH in Vitamin K (1 serving = 1 cup)  Asparagus (cooked, or boiled & drained) Broccoli (cooked, boiled & drained, or raw & chopped) Brussel sprouts (cooked, or boiled & drained) *serving size only =  cup Lettuce, raw (green leaf, endive, romaine) Spinach, raw Turnip greens, raw & chopped   These websites have more information on Coumadin (warfarin):  FailFactory.se; VeganReport.com.au;

## 2018-03-03 NOTE — Progress Notes (Signed)
Pt discharged home accompanied by wife. Pt stable at time of Discharge. Morning meds given after pt returned from dialysis. No pain reported. No further questions from pt or family.   Alexander [name] Alexander [name]

## 2018-03-03 NOTE — Progress Notes (Signed)
Discharge Note  The overall goal for the admission was met for:   Discharge location: Yes - home with wife who can provide 24/7 supervision  Length of Stay: Yes - 7 days  Discharge activity level: Yes - supervision  Home/community participation: Yes  Services provided included: MD, RD, PT, OT, RN, TR, Pharmacy and SW  Financial Services: Medicare and Private Insurance: [place]  Follow-up services arranged: Home Health: PT, OT via [facility], DME: 3n1 commode via [facility] and Patient/Family has no preference for HH/DME agencies  Comments (or additional information):  Patient/Family verbalized understanding of follow-up arrangements: Yes  Individual responsible for coordination of the follow-up plan: pt  Confirmed correct DME delivered: Laquinn Shippy 03/03/2018    Davette Nugent

## 2018-03-03 NOTE — Progress Notes (Signed)
[facility] PHYSICAL MEDICINE & REHABILITATION PROGRESS NOTE   Subjective/Complaints: Pt up in HD. No new issues. Still fatigues  ROS: Patient denies fever, rash, sore throat, blurred vision, nausea, vomiting, diarrhea, cough, shortness of breath or chest pain, joint or back pain, headache, or mood change.     Objective:   No results found. Recent Labs    03/01/18 1523 03/03/18 0811  WBC 10.5 9.4  HGB 9.0* 8.8*  HCT 29.3* 29.2*  PLT 217 259   Recent Labs    03/01/18 1518 03/03/18 0811  NA 131* 137  K 4.4 4.5  CL 93* 96*  CO2 24 27  GLUCOSE 84 112*  BUN 68* 47*  CREATININE 7.12* 5.96*  CALCIUM 7.9* 8.3*    Intake/Output Summary (Last 24 hours) at 03/03/2018 1120 Last data filed at 03/02/2018 1727 Gross per 24 hour  Intake 480 ml  Output -  Net 480 ml     Physical Exam: Vital Signs Blood pressure (!) 132/55, pulse 65, temperature 97.9 F (36.6 C), temperature source Oral, resp. rate 18, height 5\' 10"  (1.778 m), weight 88.5 kg, SpO2 94 %. Constitutional: No distress . Vital signs reviewed. HEENT: EOMI, oral membranes moist Neck: supple Cardiovascular: RRR without murmur. No JVD    Respiratory: CTA Bilaterally without wheezes or rales. Normal effort    GI: BS +, non-tender, non-distended  Musculoskeletal: He exhibits edema (left thigh).  Edema and tenderness left flank. Minimal to no crepitus appreciated today below lower thigh. Neurological: Oriented to person, place, month/year Motor: Right upper extremity/right lower extremity: 5/5 proximal distal Left upper extremity: 4+/5 proximal distal-stable motor Left lower extremity: Hip flexion 3+/5, knee extension 2+/5, ankle dorsiflexion 4/5 Right lower extremity: Hip flexion 4-/5, knee extension 3/5, ankle dorsiflexion 4+/5 Sensation diminished light touch left upper and left lower extremity--stable Skin: Skin is warm and dry.  Abdominal and flank wounds all closed, scabbed--no change, sutures in flank in  place Psychiatric: pleasant   Assessment/Plan: 1. Functional deficits secondary to debility which require 15 hours over 7 days of interdisciplinary therapy in a comprehensive inpatient rehab setting.  Physiatrist is providing close team supervision and 24 hour management of active medical problems listed below.  Physiatrist and rehab team continue to assess barriers to discharge/monitor patient progress toward functional and medical goals  Care Tool:  Bathing  Bathing activity did not occur: Safety/medical concerns(first day admission) Body parts bathed by patient: Right arm, Left arm, Chest, Abdomen, Front perineal area, Buttocks, Right upper leg, Left upper leg, Right lower leg, Left lower leg, Face         Bathing assist Assist Level: Supervision/Verbal cueing     Upper Body Dressing/Undressing Upper body dressing Upper body dressing/undressing activity did not occur (including orthotics): N/A What is the patient wearing?: Pull over shirt    Upper body assist Assist Level: Set up assist    Lower Body Dressing/Undressing Lower body dressing    Lower body dressing activity did not occur: N/A What is the patient wearing?: Pants, Underwear/pull up     Lower body assist Assist for lower body dressing: Set up assist     Toileting Toileting Toileting Activity did not occur (Clothing management and hygiene only): N/A (no void or bm)  Toileting assist Assist for toileting: Supervision/Verbal cueing     Transfers Chair/bed transfer  Transfers assist  Chair/bed transfer activity did not occur: Safety/medical concerns  Chair/bed transfer assist level: Supervision/Verbal cueing     Locomotion Ambulation   Ambulation assist  Assist level: Supervision/Verbal cueing Assistive device: Walker-rolling Max distance: 150   Walk 10 feet activity   Assist     Assist level: Supervision/Verbal cueing Assistive device: Walker-rolling   Walk 50 feet  activity   Assist Walk 50 feet with 2 turns activity did not occur: Safety/medical concerns  Assist level: Supervision/Verbal cueing Assistive device: Walker-rolling    Walk 150 feet activity   Assist Walk 150 feet activity did not occur: Safety/medical concerns  Assist level: Supervision/Verbal cueing Assistive device: Walker-rolling    Walk 10 feet on uneven surface  activity   Assist Walk 10 feet on uneven surfaces activity did not occur: Safety/medical concerns     Assistive device: Aeronautical engineer Will patient use wheelchair at discharge?: No             Wheelchair 50 feet with 2 turns activity    Assist            Wheelchair 150 feet activity     Assist           Medical Problem List and Plan: 1.  Deficits with mobility, transfers, self-care secondary to debility status post extensive urological procedures  --dc home today  -follow up with urology, primary, nephro 2.  DVT Prophylaxis/Anticoagulation: Mechanical: Sequential compression devices, below knee Bilateral lower extremities 3. Chronic pain/Pain Management: Continue gabapentin. Controlled by tylenol prn 4. Mood: LCSW to follow for evaluation and support.  5. Neuropsych: This patient is somewhat capable of making decisions on his own behalf. 6. Skin/Wound Care: local care, sutures out 7. Fluids/Electrolytes/Nutrition: Routine labs with HD. Renal diet with 1200 cc FR.  8. Bladder cancer s/p resection: Continue to monitor wound for healing. GU following for input.   9. ESRD: HD MWF at the end of the day to help with tolerance of therapy.   -daily weights ongoing with HD 10.  CAD s/p PPM for bradycardia: Continue to monitor BP/HR bid.  11.  Anemia of chronic disease:  H/H stable post 2 units on 11/26. Continue to monitor with serial check on HD days.  12.  Chronic A Fib: Heart rate controlled on lanoxin daily. Off metoprolol and diltiazem at this time.    -coumadin resumed.  13. H/o Emphysema: Continue prednisone -- prn inhaler. Has been weaned off oxygen--  -IS, supp O2 if needed   -tolerating activities with therapy 14. 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  81. H/o CVA: Loss of STM and vision.  16. Subcutaneous emphysema of lower exts: most likely surgically related.   -seems largely resolved today. Some still in thigh    normal pulses  -observe for now. Surgery   aware     LOS: 7 days A FACE TO FACE EVALUATION WAS PERFORMED  Meredith Staggers 03/03/2018, 11:19 AM

## 2018-03-04 DIAGNOSIS — Z483 Aftercare following surgery for neoplasm: Secondary | ICD-10-CM | POA: Diagnosis not present

## 2018-03-04 DIAGNOSIS — D631 Anemia in chronic kidney disease: Secondary | ICD-10-CM | POA: Diagnosis not present

## 2018-03-04 DIAGNOSIS — N186 End stage renal disease: Secondary | ICD-10-CM | POA: Diagnosis not present

## 2018-03-04 DIAGNOSIS — I120 Hypertensive chronic kidney disease with stage 5 chronic kidney disease or end stage renal disease: Secondary | ICD-10-CM | POA: Diagnosis not present

## 2018-03-04 DIAGNOSIS — Z992 Dependence on renal dialysis: Secondary | ICD-10-CM | POA: Diagnosis not present

## 2018-03-04 DIAGNOSIS — C678 Malignant neoplasm of overlapping sites of bladder: Secondary | ICD-10-CM | POA: Diagnosis not present

## 2018-03-06 ENCOUNTER — Telehealth: Payer: Self-pay | Admitting: Registered Nurse

## 2018-03-06 DIAGNOSIS — I482 Chronic atrial fibrillation, unspecified: Secondary | ICD-10-CM | POA: Diagnosis not present

## 2018-03-06 NOTE — Telephone Encounter (Signed)
Transitional Care call Transitional Care Call Questions answered by wife Ms. Clanton  Patient name: Alexander [name]  DOB: 10-29-1940 1. Are you/is patient experiencing any problems since coming home? Yes, diarrhea. Ms. Kurtz decreased the stool softners, she reports.  a. Are there any questions regarding any aspect of care? No 2. Are there any questions regarding medications administration/dosing? No a. Are meds being taken as prescribed? Yes, except stool softners due to diarrhea.  b. "Patient should review meds with caller to confirm"  Medication List Reviewed.  3. Have there been any falls? No 4. Has Home Health been to the house and/or have they contacted you? Yes, [facility]. a. If not, have you tried to contact them? NA b. Can we help you contact them? NA 5. Are bowels and bladder emptying properly? Yes: bladder: Bowel diarrhea stool softner decreased, continue to monitor.  a. Are there any unexpected incontinence issues? No b. If applicable, is patient following bowel/bladder programs? NA 6. Any fevers, problems with breathing, unexpected pain? No 7. Are there any skin problems or new areas of breakdown? No 8. Has the patient/family member arranged specialty MD follow up (ie cardiology/neurology/renal/surgical/etc.)?  Yes, all appointments have been schedules, Mrs. Birnie reports.  a. Can we help arrange? NA 9. Does the patient need any other services or support that we can help arrange? No 10. Are caregivers following through as expected in assisting the patient? Yes 11. Has the patient quit smoking, drinking alcohol, or using drugs as recommended? Mrs. Mells reports Mr. Casanova doesn't smoke, drink alcohol or use illicit drugs.   Appointment date/time 03/07/2018  arrival time 12:45, appointment time 1:00 with Danella Sensing ANP. [place]

## 2018-03-07 ENCOUNTER — Encounter: Payer: Self-pay | Admitting: Cardiovascular Disease

## 2018-03-07 ENCOUNTER — Ambulatory Visit (INDEPENDENT_AMBULATORY_CARE_PROVIDER_SITE_OTHER): Payer: Medicare Other | Admitting: Cardiovascular Disease

## 2018-03-07 ENCOUNTER — Ambulatory Visit (INDEPENDENT_AMBULATORY_CARE_PROVIDER_SITE_OTHER): Payer: Medicare Other | Admitting: Pharmacist Clinician (PhC)/ Clinical Pharmacy Specialist

## 2018-03-07 ENCOUNTER — Encounter: Payer: Medicare Other | Attending: Registered Nurse | Admitting: Registered Nurse

## 2018-03-07 ENCOUNTER — Encounter: Payer: Self-pay | Admitting: Registered Nurse

## 2018-03-07 VITALS — BP 145/72 | HR 74 | Ht 70.0 in | Wt 191.0 lb

## 2018-03-07 VITALS — BP 154/70 | HR 73 | Ht 70.5 in | Wt 191.8 lb

## 2018-03-07 DIAGNOSIS — N186 End stage renal disease: Secondary | ICD-10-CM | POA: Insufficient documentation

## 2018-03-07 DIAGNOSIS — E1142 Type 2 diabetes mellitus with diabetic polyneuropathy: Secondary | ICD-10-CM | POA: Insufficient documentation

## 2018-03-07 DIAGNOSIS — I509 Heart failure, unspecified: Secondary | ICD-10-CM | POA: Insufficient documentation

## 2018-03-07 DIAGNOSIS — Z8711 Personal history of peptic ulcer disease: Secondary | ICD-10-CM | POA: Insufficient documentation

## 2018-03-07 DIAGNOSIS — H548 Legal blindness, as defined in USA: Secondary | ICD-10-CM | POA: Insufficient documentation

## 2018-03-07 DIAGNOSIS — Z952 Presence of prosthetic heart valve: Secondary | ICD-10-CM

## 2018-03-07 DIAGNOSIS — I951 Orthostatic hypotension: Secondary | ICD-10-CM | POA: Diagnosis not present

## 2018-03-07 DIAGNOSIS — Z8249 Family history of ischemic heart disease and other diseases of the circulatory system: Secondary | ICD-10-CM | POA: Insufficient documentation

## 2018-03-07 DIAGNOSIS — C679 Malignant neoplasm of bladder, unspecified: Secondary | ICD-10-CM | POA: Insufficient documentation

## 2018-03-07 DIAGNOSIS — I5032 Chronic diastolic (congestive) heart failure: Secondary | ICD-10-CM

## 2018-03-07 DIAGNOSIS — E1122 Type 2 diabetes mellitus with diabetic chronic kidney disease: Secondary | ICD-10-CM | POA: Diagnosis not present

## 2018-03-07 DIAGNOSIS — I132 Hypertensive heart and chronic kidney disease with heart failure and with stage 5 chronic kidney disease, or end stage renal disease: Secondary | ICD-10-CM | POA: Diagnosis not present

## 2018-03-07 DIAGNOSIS — Z87442 Personal history of urinary calculi: Secondary | ICD-10-CM | POA: Insufficient documentation

## 2018-03-07 DIAGNOSIS — K219 Gastro-esophageal reflux disease without esophagitis: Secondary | ICD-10-CM | POA: Diagnosis not present

## 2018-03-07 DIAGNOSIS — R262 Difficulty in walking, not elsewhere classified: Secondary | ICD-10-CM | POA: Insufficient documentation

## 2018-03-07 DIAGNOSIS — R5381 Other malaise: Secondary | ICD-10-CM | POA: Insufficient documentation

## 2018-03-07 DIAGNOSIS — I2510 Atherosclerotic heart disease of native coronary artery without angina pectoris: Secondary | ICD-10-CM | POA: Insufficient documentation

## 2018-03-07 DIAGNOSIS — I4891 Unspecified atrial fibrillation: Secondary | ICD-10-CM

## 2018-03-07 DIAGNOSIS — Z8673 Personal history of transient ischemic attack (TIA), and cerebral infarction without residual deficits: Secondary | ICD-10-CM | POA: Insufficient documentation

## 2018-03-07 DIAGNOSIS — E785 Hyperlipidemia, unspecified: Secondary | ICD-10-CM | POA: Diagnosis not present

## 2018-03-07 DIAGNOSIS — Z87891 Personal history of nicotine dependence: Secondary | ICD-10-CM | POA: Diagnosis not present

## 2018-03-07 DIAGNOSIS — I495 Sick sinus syndrome: Secondary | ICD-10-CM

## 2018-03-07 DIAGNOSIS — Z950 Presence of cardiac pacemaker: Secondary | ICD-10-CM | POA: Diagnosis not present

## 2018-03-07 DIAGNOSIS — Z7901 Long term (current) use of anticoagulants: Secondary | ICD-10-CM

## 2018-03-07 DIAGNOSIS — R1031 Right lower quadrant pain: Secondary | ICD-10-CM

## 2018-03-07 DIAGNOSIS — I482 Chronic atrial fibrillation, unspecified: Secondary | ICD-10-CM | POA: Diagnosis not present

## 2018-03-07 DIAGNOSIS — Z992 Dependence on renal dialysis: Secondary | ICD-10-CM

## 2018-03-07 LAB — POCT INR: INR: 1.3 — AB (ref 2.0–3.0)

## 2018-03-07 LAB — PROTIME-INR

## 2018-03-07 MED ORDER — OXYCODONE HCL 5 MG PO TABS: 5.0000 mg | ORAL_TABLET | Freq: Two times a day (BID) | ORAL | 0 refills | Status: AC | PRN

## 2018-03-07 NOTE — Patient Instructions (Signed)
Medication Instructions:  TAKE OXYCODONE 5 MG TWICE A DAY AS NEEDED FOR PAIN   If you need a refill on your cardiac medications before your next appointment, please call your pharmacy.   Lab work: NONE  Testing/Procedures: NONE  Follow-Up: At [facility], you and your health needs are our priority.  As part of our continuing mission to provide you with exceptional heart care, we have created designated Provider Care Teams.  These Care Teams include your primary Cardiologist (physician) and Advanced Practice Providers (APPs -  Physician Assistants and Nurse Practitioners) who all work together to provide you with the care you need, when you need it. You will need a follow up appointment in 3 months.  You may see Skeet Latch, MD  or one of the following Advanced Practice Providers on your designated Care Team:   Kerin Ransom, PA-C Roby Lofts, [place] . Sande Rives, PA-C

## 2018-03-07 NOTE — Progress Notes (Signed)
Cardiology Office Note   Date:  03/07/2018   ID:  [name], DOB 01-26-1941, MRN [id number]  PCP:  Deland Pretty, MD  Cardiologist:   Skeet Latch, MD  Electrophysiologist: Allegra Lai, MD  No chief complaint on file.   History of Present Illness: Alexander [name] is a 77 y.o. male with severe aortic stenosis s/p TAVR, ESRD on HD, hypotension on midodrine, bladder cancer s/p TURBT, diabetes mellitus, hyperlipidemia, carotid artery disease with complete occlusion of the L carotid, CVA, and paroxysmal atrial fibrillation  who presents for follow up.  He was initially evaluated on 12/04/14 with hypotension requiring midodrine and syncope.  He had an echo that revealed LVEF 60-65% with grade 2 diastolic dysfunction, moderate AS and mild MR. He wore a 48 hour Holter 11/2014 that revealed episodes of dizziness associated with heart rates in the 50s. He had a PPM implanted 01/2015.  He had a repeat monitor 12/2014 that showed atrial fibrillation with occasional junctional escape.  He subsequently had a Lexiscan Cardiolite 01/2015 that was negative for ischemia but showed an area of distal inferior infarct vs artifact.  He saw Dr. Curt Bears on 10/06/15 where he was found to be in persistent atrial fibrillation.  He went into sinus rhythm briefly after DCCV with no appreciable change in symptoms.  [name] underwent placement of a 29 mm Sapien 3 TAVR on 08/2016.  Echo on 10/07/16 revealed LVEF 55-60% with mild LVH. The prostatic valve was functioning properly without any significant stenosis or regurgitation. 10/2016 he reported significant fatigue and shortness of breath. He had a chest x-ray 11/11/16 that revealed mild cardiomegaly and tiny pleural effusions consistent with mild CHF. BNP was 18,000.  His dry weight was lowered with HD with improvement in symptoms.  [name] was admitted to the hospital 11/2016 with sepsis 2/2 Strep bacteremia.  The source was unclear, but presumed to be 2/2 HD  access.  He had a CT of the spine that showed no evidence of discitis or abscess.  He also had a TEE 12/01/16 that revealed no vegetations on his PPM lead and normal TAVR valve function.  There was moderate TR and moderate MR.  He has continued to struggle with low BP during HD.  Diltiazem was discontinued and he was started on digoxin.  He had an episode of syncope that was attributed to orthostasis.  He had carotid Dopplers 04/2017 that were unchanged from prior and (100% L ICA stenosis 4-59% R).  No arrhythmias were noted on his ICD.  Metoprolol was reduced and he was started on pravastatin.  He followed up with Dr. Curt Bears on 05/2017 and had evidence of atrial fibrillation with rapid ventricular response on his pacemaker.  Metoprolol was discontinued and he was started on diltiazem 120 mg daily. At his last appointment [name] was feeling well.  His blood pressure was running in the 120s in the mornings.  Very rarely drops below 110 during dialysis.   Since his last appointment [name] was admitted 10/2017 with an antibiotic resistent Enterococcus UTI.  He was treated with vanomycin with dialysis but has persistent dysuria so he was admitted for IV fosfomycin.  That hospitalization was comp gated by an episode of altered mental status.  He had an EEG that did not show any seizure activity.  It was thought to be a TIA.  Since leaving the hospital [name] has been feeling well.  He was started on gabapentin which makes him feel drunk.  However it has helped with his neuropathy.  It is still present but much more tolerable.  His blood pressure has been stable.  At times he gets his high as 180 before dialysis.  However before he leaves it is down to 120s.  He has not had any issues with low blood pressures and reports that he is feeling the best he has in years.  In general his blood pressure is running in the 140s to 150s.  He has not noted any lower extremity edema and continues to wear compression socks  daily.  He denies orthopnea or PND.  He also has not expands any chest pain or pressure.  He underwent rototic assisted laparoscopic radical cystoprostatectomy and bilateral pelvic lymphadenectomy and bilateral nephroureterectomy.  That surgery was complicated by hypotension, acute blood loss anemia. He was discharged to [place] and returned home one week ago.  He has been working with PT 3 days per week on non-HD days.  His main complaint is pain.  He is in constant, 8/10 pain in the abdomen and pelvis. He takes scheduled Tylenol, which helps a little.  He has no chest pain or shortness of breath.  He denies LE edema. He struggles with feeling very worn out during HD.  He checks his BP and it hasn't been low.  He hasn't needed any midodrine recently.  He denies palpitations, lightheadedness or dizziness.    Past Medical History:  Diagnosis Date  . Anemia   . Aortic stenosis 06/15/12   TEE - EF 16-60%; grade 1 diastolic dysfunction; mild/mod aortic valve stenosis; Mitral valve had calcified annulus, mild pulm htn PA peak pressure 18mmHg  . Barrett's esophagus 05/2003  . Bradycardia 2017   [employer] 2240 Assurity dual-lead pacemaker  . Cancer ([place])    bladder and kidney  . Carpal tunnel syndrome, bilateral 11/03/2015  . CHF (congestive heart failure) ([place])   . Colon polyps   . Coronary artery disease   . CVA (cerebral infarction)    2004/affected left side  . Depression   . Diabetes mellitus without complication ([place])   . Diabetic peripheral neuropathy ([place]) 10/02/2015  . Diverticulosis   . Dyspnea    with exertion  . ESRD (end stage renal disease) on dialysis ([place])    "[facility]; [place]; MWF" (05/12/2017)  . Failure to thrive syndrome, adult 10/30/2017  . GERD (gastroesophageal reflux disease)   . Glaucoma   . History of kidney stones   . Hyperlipidemia   . Hypertension   . Legally blind   . Macular degeneration    both eyes  . Orthostatic hypotension 09/09/2015  .  Paroxysmal atrial fibrillation ([facility])   . Peptic ulcer    bleeding, 1969  . PONV (postoperative nausea and vomiting)   . Presence of permanent cardiac pacemaker   . S/P epidural steroid injection    last  injection over 10 years ago  . Seasonal allergies   . Tubular adenoma of colon 07/2001    Past Surgical History:  Procedure Laterality Date  . AV FISTULA PLACEMENT  2009  . BACK SURGERY    . CARDIOVERSION N/A 11/13/2015   Procedure: CARDIOVERSION;  Surgeon: Troy Sine, MD;  Location: [facility] ENDOSCOPY;  Service: Cardiovascular;  Laterality: N/A;  . CARDIOVERSION N/A 01/13/2016   Procedure: CARDIOVERSION;  Surgeon: Will Meredith [name], MD;  Location: [place];  Service: Cardiovascular;  Laterality: N/A;  . COLONOSCOPY WITH PROPOFOL N/A 01/05/2018   Procedure: COLONOSCOPY WITH PROPOFOL;  Surgeon:  Ladene Artist, MD;  Location: [facility] ENDOSCOPY;  Service: Endoscopy;  Laterality: N/A;  . CORNEAL TRANSPLANT  1999   right eye  . CYSTOSCOPY  several times   kidney stones  . CYSTOSCOPY W/ RETROGRADES Bilateral 12/13/2017   Procedure: CYSTOSCOPY WITH RETROGRADE PYELOGRAM;  Surgeon: Irine Seal, MD;  Location: [facility] ORS;  Service: Urology;  Laterality: Bilateral;  . EP IMPLANTABLE DEVICE N/A 03/11/2015   Procedure: Pacemaker Implant;  Surgeon: Will Meredith [name], MD;  [place];  Laterality: Left  . LAMINECTOMY  1969  . POLYPECTOMY  01/05/2018   Procedure: POLYPECTOMY;  Surgeon: Ladene Artist, MD;  Location: [facility] ENDOSCOPY;  Service: Endoscopy;;  . RIGHT/LEFT HEART CATH AND CORONARY ANGIOGRAPHY N/A 08/20/2016   Procedure: Right/Left Heart Cath and Coronary Angiography;  Surgeon: Burnell Blanks, MD;  Location: [place] CV LAB;  Service: Cardiovascular;  Laterality: N/A;  . ROBOT ASSITED LAPAROSCOPIC NEPHROURETERECTOMY Bilateral 02/17/2018   Procedure: XI ROBOT ASSITED LAPAROSCOPIC NEPHROURETERECTOMY;  Surgeon: Alexis Frock, MD;  Location: [facility];  Service:  Urology;  Laterality: Bilateral;  . TEE WITHOUT CARDIOVERSION N/A 07/22/2016   Procedure: TRANSESOPHAGEAL ECHOCARDIOGRAM (TEE);  Surgeon: Skeet Latch, MD;  Location: [place];  Service: Cardiovascular;  Laterality: N/A;  . TEE WITHOUT CARDIOVERSION N/A 08/31/2016   Procedure: TRANSESOPHAGEAL ECHOCARDIOGRAM (TEE);  Surgeon: Burnell Blanks, MD;  Location: [place];  Service: Open Heart Surgery;  Laterality: N/A;  . TEE WITHOUT CARDIOVERSION N/A 12/01/2016   Procedure: TRANSESOPHAGEAL ECHOCARDIOGRAM (TEE);  Surgeon: Larey Dresser, MD;  Location: [facility] ENDOSCOPY;  Service: Cardiovascular;  Laterality: N/A;  . TONSILLECTOMY  1964  . TRANSCATHETER AORTIC VALVE REPLACEMENT, TRANSFEMORAL N/A 08/31/2016   Procedure: TRANSCATHETER AORTIC VALVE REPLACEMENT, TRANSFEMORAL;  Surgeon: Burnell Blanks, MD;  Location: [place];  Service: Open Heart Surgery;  Laterality: N/A;  . TRANSURETHRAL RESECTION OF BLADDER TUMOR N/A 12/13/2017   Procedure: TRANSURETHRAL RESECTION OF BLADDER TUMOR (TURBT);  Surgeon: Irine Seal, MD;  Location: [facility];  Service: Urology;  Laterality: N/A;     Current Outpatient Medications  Medication Sig Dispense Refill  . acetaminophen (TYLENOL) 325 MG tablet Take 1-2 tablets (325-650 mg total) by mouth every 4 (four) hours as needed for mild pain.    [name] b complex-vitamin c-folic acid (NEPHRO-VITE) 0.8 MG TABS Take 1 tablet by mouth daily.     . bromocriptine (PARLODEL) 5 MG capsule Take 5 mg by mouth every other day.     . cetirizine (ZYRTEC) 10 MG tablet Take 10 mg by mouth at bedtime.     . cinacalcet (SENSIPAR) 60 MG tablet Take 60 mg by mouth at bedtime.    [name] DIGOX 125 MCG tablet TAKE 1/2 TABLET BY MOUTH EVERY DAY (Patient taking differently: Take 0.0625 mg by mouth daily. ) 15 tablet 4  . diltiazem (CARDIZEM) 120 MG tablet Take 120 mg by mouth daily.    [name] docusate sodium (COLACE) 100 MG capsule Take 1 capsule (100 mg total) by mouth 2 (two) times daily. 60 capsule 0  .  dorzolamide-timolol (COSOPT) 22.3-6.8 MG/ML ophthalmic solution Place 1 drop into both eyes 2 (two) times daily.     [name] gabapentin (NEURONTIN) 100 MG capsule Take 200 mg by mouth at bedtime.     [name] latanoprost (XALATAN) 0.005 % ophthalmic solution Place 1 drop into both eyes at bedtime.    . midodrine (PROAMATINE) 5 MG tablet Take 5 mg by mouth as directed. TAKE 1 TO 3 TABLETS DAILY AS NEEDED FOR LOW  BLOOD PRESSURE    . Nutritional Supplements (NEPRO) LIQD Take 237 mLs by mouth every Monday, Wednesday, and Friday with hemodialysis.     [name] omeprazole (PRILOSEC) 20 MG capsule Take 20 mg by mouth daily.    . pravastatin (PRAVACHOL) 10 MG tablet Take 1 tablet (10 mg total) by mouth daily. 30 tablet 0  . predniSONE (DELTASONE) 5 MG tablet Take 5 mg by mouth at bedtime.   1  . senna-docusate (SENOKOT-S) 8.6-50 MG tablet Take 2 tablets by mouth at bedtime. 60 tablet 0  . sertraline (ZOLOFT) 50 MG tablet Take 50 mg by mouth daily.  5  . sevelamer carbonate (RENVELA) 800 MG tablet Take 2 tablets (1,600 mg total) by mouth 3 (three) times daily with meals. 90 tablet 0  . warfarin (COUMADIN) 1 MG tablet Take 1 tablet (1 mg total) by mouth daily at 6 PM. Start tomorrow 30 tablet 0  . oxyCODONE (OXY IR/ROXICODONE) 5 MG immediate release tablet Take 1 tablet (5 mg total) by mouth 2 (two) times daily as needed for severe pain. 10 tablet 0   No current facility-administered medications for this visit.     Allergies:   Penicillins; Atorvastatin; Codeine; and Tramadol    Social History:  The patient  reports that he quit smoking about 20 years ago. His smoking use included cigarettes. He has a 90.00 pack-year smoking history. He has never used smokeless tobacco. He reports that he does not drink alcohol or use drugs.    Family History:  The patient's family history includes Cancer in his brother; Heart attack in his father; Heart disease in his sister; Hypertension in his father; Stomach cancer in his mother; Stroke  in his sister.    ROS:  Please see the history of present illness.   Otherwise, review of systems are positive for leg spasm.   All other systems are reviewed and negative.    PHYSICAL EXAM: VS:  BP (!) 154/70   Pulse 73   Ht 5' 10.5" (1.791 m)   Wt 191 lb 12.8 oz (87 kg)   SpO2 99%   BMI 27.13 kg/m  , BMI Body mass index is 27.13 kg/m. GENERAL:  Chronically ill-appearing.  No acute distress.  HEENT: Oral mucosa unremarkable NECK:  No jugular venous distention, waveform within normal limits, carotid upstroke brisk and symmetric, no bruits LUNGS:  Clear to auscultation bilaterally HEART:  Irregularly irregular.  PMI not displaced or sustained,S1 and S2 within normal limits, no S3, no S4, no clicks, no rubs, II/VI systolic murmur at the LUSB ABD:  Flat, positive bowel sounds normal in frequency in pitch, no bruits, no rebound, no guarding, no midline pulsatile mass, no hepatomegaly, no splenomegaly EXT:  2 plus pulses throughout, no edema, no cyanosis no clubbing.  L LE fistula +bruit SKIN:  No rashes no nodules NEURO:  Cranial nerves II through XII grossly intact, motor grossly intact throughout PSYCH:  Cognitively intact, oriented to person place and time   EKG:  EKG is not ordered today. 09/09/15: Atrial fibrillation rate 108 bpm.  Prior inferior infarct. 01/16/15: Sinus bradycardia.  First degree heart block.  LAFB.  U waves. The ekg from 11/19/14 demonstrates sinus bradycardia at 49 bpm. First degree heart block.  08/06/16: Atrial fibrillation.  Rate 82 bpm.  Occcasional VP. 11/11/16: Atrial fibrillation. Rate 97 bpm. Left bundle branch block.  03/15/17: Atrial fibrillation  Rate 105 bpm.  LBBB.  PVC  Lexiscan Cardiolite 01/28/15:  Nuclear stress EF: 67%.  The  left ventricular ejection fraction is hyperdynamic (>65%).  There was no ST segment deviation noted during stress.  This is a low risk study.  Low risk stress nuclear study with a small, severe, predominantly fixed  distal inferior defect consistent with small prior infarct vs thinning; no significant ischemia; EF 67 with normal wall motion.  Echo 10/07/16: Study Conclusions  - Left ventricle: The cavity size was normal. Wall thickness was   increased in a pattern of mild LVH. Systolic function was normal.   The estimated ejection fraction was in the range of 55% to 60%.   Wall motion was normal; there were no regional wall motion   abnormalities. Doppler parameters are consistent with high   ventricular filling pressure. - Aortic valve: A bioprosthesis was present. - Mitral valve: Calcified annulus. There was mild regurgitation.   Valve area by pressure half-time: 2.08 cm^2. - Left atrium: The atrium was mildly dilated. - Right atrium: The atrium was moderately dilated. - Pulmonary arteries: Systolic pressure was mildly increased. PA   peak pressure: 36 mm Hg (S).  Impressions:  - Normal LV systolic function; mild LVH; elevated LV filling   pressure; s/p AVR with normal mean gradient (9 mmHg) and   calculated AVA of 1.4 cm2; no AI; mild MR; biatrial enlargement;   mild TR with mildly elevated pulmonary pressure.   Recent Labs: 05/13/2017: Magnesium 1.8; TSH 1.526 02/25/2018: ALT 13 03/03/2018: BUN 47; Creatinine, Ser 5.96; Hemoglobin 8.8; Platelets 259; Potassium 4.5; Sodium 137    Lipid Panel    Component Value Date/Time   CHOL 147 08/23/2017 0916   TRIG 236 (H) 08/23/2017 0916   HDL 26 (L) 08/23/2017 0916   CHOLHDL 5.7 (H) 08/23/2017 0916   CHOLHDL 7.5 05/14/2017 0514   VLDL 55 (H) 05/14/2017 0514   LDLCALC 74 08/23/2017 0916      Wt Readings from Last 3 Encounters:  03/07/18 191 lb 12.8 oz (87 kg)  03/03/18 191 lb 9.3 oz (86.9 kg)  03/01/18 196 lb 3.4 oz (89 kg)     Other studies Reviewed: Additional studies/ records that were reviewed today include: [facility] records Review of the above records demonstrates:  Please see elsewhere in the note.     ASSESSMENT AND  PLAN:  # Severe aortic stenosis s/p TAVR:  # Shortness of breath: TAVR is functioning properly.  Symptoms have improved and TAVR looked good on echo 11/2016.  No changes.   # Hypotension/syncope:  # Hypertension: His BP is running high but he no longer requires regular midodrine.  We will tolerate permissive hypertension.  Continue with diltiazem.  # Bradycardia/longstanding persistent atrial fibrillation:  Chronic atrial fibrillation.  Now on digoxin and diltiazem.  Continue warfarin.    # Hyperlipidemia: He had myalgias on atorvastatin but is tolerating pravastatin much better.  LDL was 74 on 07/2017.  Continue pravastatin.  # Peripheral neuropathy:  Continue gabapentin.  # Post-op pain:  Mr. Mizrahi underwent TURBT and continues to have 8/10 pain.  He is taking Tylenol without relief.  We will give oxycodone 5mg  bid prn.  He has follow up with surgery in 2 days and has a pain management MD as well who reportedly told him oxycodone would be an OK choice for him.   # Goals of care: We discussed the big picture and code status.  For now he is full code.  He will discuss with his wife and daughter about what he would want in the case of a cardiopulmonary event.  He will also discuss whether he wants to continue to pursue aggressive treatments.   Current medicines are reviewed at length with the patient today.  The patient does not have concerns regarding medicines.  The following changes have been made: none  Labs/ tests ordered today include:   No orders of the defined types were placed in this encounter.    Disposition:   FU with Dr. Jonelle Sidle C. [place] in 3 months.   Signed, Skeet Latch, MD  03/07/2018 11:47 AM    [place]

## 2018-03-07 NOTE — Progress Notes (Signed)
Subjective:    Patient ID: Alexander [name], male    DOB: 1940-12-25, 77 y.o.   MRN: [id number]  HPI: Alexander [name] is a 77 y.o. male who is here for Transitional care visit in follow up of his  Debility, chronic atrial fibrillation, Diabetic peripheral neuropathy, bladder cancer, right lower quadrant abdominal painand ESRD. He has been home with [place] with [facility].  He states he has abdominal pain ( Flank pain)  ( mainly right side), this provider asked Alexander [name]  if the pain is the same as when he was in the hospital or increase, he states " the pain is the same. Tenderness noted to right flank region and at the surgical site has redness, Alexander [name] states the redness is the same as well. Mr. And Mrs. [name] and daughter was instructed to call surgeon ( Dr. Tresa Moore) if pain increases in intensity or go to the emergency department for evaluation, they verbalize understanding. Alexander [name] is afebrile.  He reports fair appetite.   Alexander [name] and daughter in room all questions answered.    Pain Inventory Average Pain 7 Pain Right Now 8 My pain is sharp, burning, dull and aching  In the last 24 hours, has pain interfered with the following? General activity 10 Relation with others 10 Enjoyment of life 10 What TIME of day is your pain at its worst? all Sleep (in general) Good  Pain is worse with: walking, bending and some activites Pain improves with: medication Relief from Meds: 5  Mobility walk with assistance use a walker how many minutes can you walk? 3 ability to climb steps?  no do you drive?  no use a wheelchair needs help with transfers Do you have any goals in this area?  yes  Function disabled: date disabled 2003 I need assistance with the following:  bathing  Neuro/Psych trouble walking dizziness confusion  Prior Studies Any changes since last visit?  no  Physicians involved in your care Primary care [facility]   Family History  Problem  Relation Age of Onset  . Stomach cancer Mother   . Hypertension Father        Died of heart attack  . Heart attack Father   . Stroke Sister   . Heart disease Sister   . Cancer Brother   . Colon cancer Neg Hx    Social History   Socioeconomic History  . Marital status: Married    Spouse name: Not on file  . Number of children: 3  . Years of education: 10  . Highest education level: Not on file  Occupational History  . 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 Financial resource strain: Not on file  . Food insecurity:    Worry: Not on file    Inability: Not on file  . Transportation needs:    Medical: Not on file    Non-medical: Not on file  Tobacco Use  . Smoking status: Former Smoker    Packs/day: 2.00    Years: 45.00    Pack years: 90.00    Types: Cigarettes    Last attempt to quit: 01/20/1998    Years since quitting: 20.1  . Smokeless tobacco: Never Used  Substance and Sexual Activity  . Alcohol use: No    Alcohol/week: 0.0 standard drinks  . Drug use: No  . Sexual activity: Yes  Lifestyle  . Physical activity:    Days per week: Not on file  Minutes per session: Not on file  . Stress: Not on file  Relationships  . Social connections:    Talks on phone: Not on file    Gets together: Not on file    Attends religious service: Not on file    Active member of club or organization: Not on file    Attends meetings of clubs or organizations: Not on file    Relationship status: Not on file  Other Topics Concern  . Not on file  Social History Narrative   Lives at home w/ his wife   Right-handed   Drinks 1 cup of coffee per day   Past Surgical History:  Procedure Laterality Date  . AV FISTULA PLACEMENT  2009  . BACK SURGERY    . CARDIOVERSION N/A 11/13/2015   Procedure: CARDIOVERSION;  Surgeon: Troy Sine, MD;  Location: [facility] ENDOSCOPY;  Service: Cardiovascular;  Laterality: N/A;  . CARDIOVERSION N/A 01/13/2016   Procedure:  CARDIOVERSION;  Surgeon: Will Meredith [name], MD;  Location: [place];  Service: Cardiovascular;  Laterality: N/A;  . COLONOSCOPY WITH PROPOFOL N/A 01/05/2018   Procedure: COLONOSCOPY WITH PROPOFOL;  Surgeon: Ladene Artist, MD;  Location: [facility] ENDOSCOPY;  Service: Endoscopy;  Laterality: N/A;  . CORNEAL TRANSPLANT  1999   right eye  . CYSTOSCOPY  several times   kidney stones  . CYSTOSCOPY W/ RETROGRADES Bilateral 12/13/2017   Procedure: CYSTOSCOPY WITH RETROGRADE PYELOGRAM;  Surgeon: Irine Seal, MD;  Location: [facility] ORS;  Service: Urology;  Laterality: Bilateral;  . EP IMPLANTABLE DEVICE N/A 03/11/2015   Procedure: Pacemaker Implant;  Surgeon: Will Meredith [name], MD;  [place];  Laterality: Left  . LAMINECTOMY  1969  . POLYPECTOMY  01/05/2018   Procedure: POLYPECTOMY;  Surgeon: Ladene Artist, MD;  Location: [facility] ENDOSCOPY;  Service: Endoscopy;;  . RIGHT/LEFT HEART CATH AND CORONARY ANGIOGRAPHY N/A 08/20/2016   Procedure: Right/Left Heart Cath and Coronary Angiography;  Surgeon: Burnell Blanks, MD;  Location: [place] CV LAB;  Service: Cardiovascular;  Laterality: N/A;  . ROBOT ASSITED LAPAROSCOPIC NEPHROURETERECTOMY Bilateral 02/17/2018   Procedure: XI ROBOT ASSITED LAPAROSCOPIC NEPHROURETERECTOMY;  Surgeon: Alexis Frock, MD;  Location: [facility];  Service: Urology;  Laterality: Bilateral;  . TEE WITHOUT CARDIOVERSION N/A 07/22/2016   Procedure: TRANSESOPHAGEAL ECHOCARDIOGRAM (TEE);  Surgeon: Skeet Latch, MD;  Location: [place];  Service: Cardiovascular;  Laterality: N/A;  . TEE WITHOUT CARDIOVERSION N/A 08/31/2016   Procedure: TRANSESOPHAGEAL ECHOCARDIOGRAM (TEE);  Surgeon: Burnell Blanks, MD;  Location: [place];  Service: Open Heart Surgery;  Laterality: N/A;  . TEE WITHOUT CARDIOVERSION N/A 12/01/2016   Procedure: TRANSESOPHAGEAL ECHOCARDIOGRAM (TEE);  Surgeon: Larey Dresser, MD;  Location[facility] ENDOSCOPY;  Service: Cardiovascular;   Laterality: N/A;  . TONSILLECTOMY  1964  . TRANSCATHETER AORTIC VALVE REPLACEMENT, TRANSFEMORAL N/A 08/31/2016   Procedure: TRANSCATHETER AORTIC VALVE REPLACEMENT, TRANSFEMORAL;  Surgeon: Burnell Blanks, MD;  Location: [place];  Service: Open Heart Surgery;  Laterality: N/A;  . TRANSURETHRAL RESECTION OF BLADDER TUMOR N/A 12/13/2017   Procedure: TRANSURETHRAL RESECTION OF BLADDER TUMOR (TURBT);  Surgeon: Irine Seal, MD;  Location: [facility];  Service: Urology;  Laterality: N/A;   Past Medical History:  Diagnosis Date  . Anemia   . Aortic stenosis 06/15/12   TEE - EF 70-35%; grade 1 diastolic dysfunction; mild/mod aortic valve stenosis; Mitral valve had calcified annulus, mild pulm htn PA peak pressure 70mmHg  . Barrett's esophagus 05/2003  . Bradycardia 2017   [employer] 2240 Assurity dual-lead pacemaker  . Cancer ([place])    bladder and kidney  . Carpal tunnel syndrome, bilateral 11/03/2015  . CHF (congestive heart failure) ([place])   . Colon polyps   . Coronary artery disease   . CVA (cerebral infarction)    2004/affected left side  . Depression   . Diabetes mellitus without complication ([place])   . Diabetic peripheral neuropathy ([place]) 10/02/2015  . Diverticulosis   . Dyspnea    with exertion  . ESRD (end stage renal disease) on dialysis ([place])    "[facility]; [place]; MWF" (05/12/2017)  . Failure to thrive syndrome, adult 10/30/2017  . GERD (gastroesophageal reflux disease)   . Glaucoma   . History of kidney stones   . Hyperlipidemia   . Hypertension   . Legally blind   . Macular degeneration    both eyes  . Orthostatic hypotension 09/09/2015  . Paroxysmal atrial fibrillation ([facility])   . Peptic ulcer    bleeding, 1969  . PONV (postoperative nausea and vomiting)   . Presence of permanent cardiac pacemaker   . S/P epidural steroid injection    last  injection over 10 years ago  . Seasonal allergies   . Tubular adenoma of colon 07/2001   BP (!) 145/72   Pulse 74   Ht 5\' 10"  (1.778 m)    Wt 191 lb (86.6 kg)   SpO2 97%   BMI 27.41 kg/m   Opioid Risk Score:   Fall Risk Score:  `1  Depression screen PHQ 2/9  Depression screen [facility] 2/9 03/07/2018 10/20/2017 04/07/2017 12/03/2016 10/26/2016  Decreased Interest 0 0 0 0 0  Down, Depressed, Hopeless 0 0 0 0 0  PHQ - 2 Score 0 0 0 0 0  Some recent data might be hidden    Review of Systems  Constitutional: Positive for appetite change.  HENT: Negative.   Eyes: Negative.   Respiratory: Positive for cough.   Cardiovascular: Negative.   Gastrointestinal: Positive for abdominal pain.  Endocrine: Negative.   Genitourinary: Negative.   Musculoskeletal: Negative.   Skin: Negative.   Allergic/Immunologic: Negative.   Neurological: Negative.   Hematological: Bruises/bleeds easily.  Psychiatric/Behavioral: Negative.   All other systems reviewed and are negative.      Objective:   Physical Exam  Constitutional: He is oriented to person, place, and time. He appears well-developed and well-nourished.  HENT:  Head: Normocephalic and atraumatic.  Neck: Normal range of motion. Neck supple.  Cardiovascular: Normal rate and regular rhythm.  Pulmonary/Chest: Effort normal and breath sounds normal.  Abdominal: Soft. There is tenderness.  Right Lower [place]  Musculoskeletal:  Normal Muscle Bulk and Muscle Testing Reveals:  Upper Extremities: Full ROM and Muscle Strength 5/5  Lower  Extremities: Full ROM and Muscle Strength 5/5 Arrived in wheelchair   Neurological: He is alert and oriented to person, place, and time.  Skin: Skin is warm and dry.  Nursing note and vitals reviewed.         Assessment & Plan:  1. Bladder Cancer/ Right Lower Quadrant Abdominal Pain : He has a scheduled appointment with Dr. Tresa Moore. Also instructed to call Dr. Tresa Moore or go to the Emergency Room if changes in pain. They verbalize understanding.  2. Debility: Continue Home Health Therapies. Continue to Monitor.  3. Chronic Atrial fibrillation:  Cardiology Following. 4. Diabetic peripheral neuropathy: Continue Gabapentin. Continue to monitor. PCP Following.  5.ESRD on dialysis: Nephrology following.   30 minutes of face to face patient care time  was spent during this visit. All questions were encouraged and answered.  F/U with Dr. Naaman [name] in 4-6 weeks

## 2018-03-14 ENCOUNTER — Encounter: Payer: Medicare Other | Admitting: Registered Nurse

## 2018-03-15 ENCOUNTER — Encounter: Payer: Medicare Other | Admitting: Registered Nurse

## 2018-03-15 ENCOUNTER — Other Ambulatory Visit: Payer: Self-pay | Admitting: *Deleted

## 2018-03-15 NOTE — Patient Outreach (Signed)
[name]) Care Management Alexander [name] Telephone Outreach Care Coordination- Advice only  03/15/2018  Alexander [name] [birth year] [id number]  Received call from from [name], caregiver/ daughter, on [name] written consent, ofFred C Starr,76 y.o.male previously active [place] CM. Patient has history including, but not limited to, PAD, Aortic stenosis with TAVR 08/31/16, ESRD on HD (M, W, F), A-Fib on coumadin, DM, CVA in 2004, CHF, macular degeneration with legal blindness, and multiple falls.  HIPAA- identity verified with caregiver.    Today, daughter/ caregiver updated me that patient had recent surgery for bladder cancer and reports that patient's kidneys, bladder, and prostate were removed.  Reports that she is concerned that both patient and his wife, her mother, need additional care in the home and possibly to be moved to an assisted living facility; [name] asked for advice on how to begin looking for both private duty caregivers as well as the process to take to find an assisted living facility.  Discussed possibility of re-opening [name] CM case with placement of [name] referral to assist her, however, she declines re-opening patient's [name] case today, stating that she believes things "are okay for now."  Stated that she and her siblings are starting this process and just wanted advice to guide them.    Discussed with caregiver value of keeping care providers informed of patient level of care needs and encouraged her to maintain frequent contact with care providers.  Discussed private duty options and process to find assisted living options available to patient.  Encouraged caregiver to contact both private duty agencies and assisted living facilities and to interview all in person.  Again confirmed that caregiver that she has valid concerns, and that patient safety is priority.  Several times throughout conversation offered [name] assistance to caregiver, however, she declined each offer, "for now," assuring me that she would re- contact me should she reconsider and want [facility] CM assistance.  Caregiver denies further issues, concerns, or problems today.  I confirmed that she has my direct phone number, the main [facility] CM office phone number, and the [facility] CM 24-hour nurse advice phone numbers, and encouraged her to contact me for further assistance as needed, and she agrees to do so.  [name], RN, BSN, [employer] [facility] Care Management  [phone]

## 2018-03-18 ENCOUNTER — Encounter (HOSPITAL_COMMUNITY): Payer: Self-pay

## 2018-03-18 ENCOUNTER — Emergency Department (HOSPITAL_COMMUNITY)
Admission: EM | Admit: 2018-03-18 | Discharge: 2018-03-29 | Disposition: E | Payer: Medicare Other | Attending: Emergency Medicine | Admitting: Emergency Medicine

## 2018-03-18 DIAGNOSIS — E119 Type 2 diabetes mellitus without complications: Secondary | ICD-10-CM | POA: Diagnosis not present

## 2018-03-18 DIAGNOSIS — Z8551 Personal history of malignant neoplasm of bladder: Secondary | ICD-10-CM | POA: Diagnosis not present

## 2018-03-18 DIAGNOSIS — Z7901 Long term (current) use of anticoagulants: Secondary | ICD-10-CM | POA: Diagnosis not present

## 2018-03-18 DIAGNOSIS — I469 Cardiac arrest, cause unspecified: Secondary | ICD-10-CM | POA: Diagnosis present

## 2018-03-18 DIAGNOSIS — I4891 Unspecified atrial fibrillation: Secondary | ICD-10-CM | POA: Insufficient documentation

## 2018-03-18 DIAGNOSIS — Z85528 Personal history of other malignant neoplasm of kidney: Secondary | ICD-10-CM | POA: Diagnosis not present

## 2018-03-29 NOTE — ED Provider Notes (Signed)
[facility] EMERGENCY DEPARTMENT Provider Note   CSN: [id number] Arrival date & time: 04/06/2018  1303     History   Chief Complaint Chief Complaint  Patient presents with  . Cardiac Arrest    HPI Alexander [name] is a 78 y.o. [name].  The history is provided by the EMS personnel.  Cardiac Arrest  Witnessed by:  Bystander Incident location:  Home Time since incident:  80 minutes Time before BLS initiated:  3-5 minutes Time before ALS initiated:  Immediate Condition upon EMS arrival:  Unresponsive Pulse:  Absent Initial cardiac rhythm per EMS:  Ventricular fibrillation Treatments prior to arrival:  ACLS protocol Medications given prior to ED:  Sodium bicarbonate, amiodarone and epinephrine (calcium) Airway:  Intubation prior to arrival Rhythm on admission to ED:  PEA   Past Medical History:  Diagnosis Date  . Anemia   . Aortic stenosis 06/15/12   TEE - EF 31-54%; grade 1 diastolic dysfunction; mild/mod aortic valve stenosis; Mitral valve had calcified annulus, mild pulm htn PA peak pressure 6mmHg  . Barrett's esophagus 05/2003  . Bradycardia 2017   [employer] 2240 Assurity dual-lead pacemaker  . Cancer ([place])    bladder and kidney  . Carpal tunnel syndrome, bilateral 11/03/2015  . CHF (congestive heart failure) ([place])   . Colon polyps   . Coronary artery disease   . CVA (cerebral infarction)    2004/affected left side  . Depression   . Diabetes mellitus without complication ([place])   . Diabetic peripheral neuropathy ([place]) 10/02/2015  . Diverticulosis   . Dyspnea    with exertion  . ESRD (end stage renal disease) on dialysis ([place])    "[facility]; [place]; MWF" (05/12/2017)  . Failure to thrive syndrome, adult 10/30/2017  . GERD (gastroesophageal reflux disease)   . Glaucoma   . History of kidney stones   . Hyperlipidemia   . Hypertension   . Legally blind   . Macular degeneration    both eyes  . Orthostatic hypotension 09/09/2015  . Paroxysmal atrial  fibrillation (HCC)   . Peptic ulcer    bleeding, 1969  . PONV (postoperative nausea and vomiting)   . Presence of permanent cardiac pacemaker   . S/P epidural steroid injection    last  injection over 10 years ago  . Seasonal allergies   . Tubular adenoma of colon 07/2001    Patient Active Problem List   Diagnosis Date Noted  . Subcutaneous emphysema associated with surgical procedure 03/03/2018  . Debility 02/24/2018  . Hypoxia   . Acute on chronic anemia   . ESRD on dialysis ([place])   . Postoperative pain   . Chronic diastolic congestive heart failure ([place])   . Coronary artery disease involving native coronary artery of native heart without angina pectoris   . Status post placement of cardiac pacemaker   . History of CVA with residual deficit   . Diabetes mellitus type 2 in nonobese (HCC)   . Atrial fibrillation ([place])   . Bladder cancer ([place]) 02/17/2018  . Abnormal CT scan, colon   . Benign neoplasm of ascending colon   . Benign neoplasm of cecum   . Benign neoplasm of transverse colon   . Benign neoplasm of descending colon   . S/P TURP 12/14/2017  . Cancer of overlapping sites of bladder ([place]) 12/13/2017  . Post-operative complication 00/86/7619  . Balanitis 10/31/2017  . Failure to thrive syndrome, adult 10/30/2017  . Hematuria 10/21/2017  . UTI (urinary  tract infection) 10/20/2017  . Syncope 05/12/2017  . Near syncope 05/12/2017  . Elevated troponin 05/12/2017  . CVA (cerebral vascular accident) ([place]) 04/12/2017  . Dialysis patient ([place]) 04/12/2017  . Seasonal allergies   . S/P epidural steroid injection   . Peptic ulcer   . Paroxysmal atrial fibrillation (HCC)   . Macular degeneration   . Legally blind   . Kidney stones   . Hypertension   . ESRD (end stage renal disease) ([place])   . Dyspnea   . Diverticulosis   . Diabetes mellitus without complication ([place])   . Colon polyps   . Anemia   . Presence of permanent cardiac pacemaker 11/24/2016  . Atrial  fibrillation with RVR ([place]) 11/23/2016  . Glaucoma 11/23/2016  . GERD (gastroesophageal reflux disease) 11/23/2016  . Hypomagnesemia 11/23/2016  . Diabetes mellitus, type II ([place]) 11/23/2016  . Depression 11/23/2016  . S/P TAVR (transcatheter aortic valve replacement) 08/31/2016  . Long term (current) use of anticoagulants [Z79.01] 08/13/2016  . Anticoagulated on Coumadin 01/19/2016  . Impacted cerumen of left ear 01/19/2016  . Otorrhagia of right ear 01/19/2016  . Sudden right hearing loss 01/19/2016  . Persistent atrial fibrillation   . Carotid occlusion, left 11/04/2015  . Carpal tunnel syndrome, bilateral 11/03/2015  . Diabetic peripheral neuropathy ([place]) 10/02/2015  . Orthostatic hypotension 09/09/2015  . Bradycardia 03/30/2015  . OAG (open angle glaucoma) suspect, high risk, left 05/28/2014  . Long-term (current) use of anticoagulants 04/08/2014  . Hyperlipidemia 04/04/2014  . Primary open-angle glaucoma 07/10/2013  . ESRD (end stage renal disease) on dialysis ([place]) 03/13/2013  . Pseudophakia 01/02/2013  . PVD (posterior vitreous detachment) 01/02/2013  . PAD (peripheral artery disease) ([place]) 12/12/2012  . Occlusion and stenosis of bilateral carotid arteries 08/17/2012  . Severe aortic stenosis 06/15/2012  . Aortic stenosis 06/15/2012  . Organ transplant candidate 01/20/2012  . Exudative macular degeneration ([place]) 12/02/2011  . Retinal detachment 12/02/2011  . Hx of adenomatous colonic polyps 02/03/2011  . Esophageal reflux 02/03/2011  . Anemia, unspecified 02/03/2011  . Barrett's esophagus 05/28/2003  . Tubular adenoma of colon 07/27/2001    Past Surgical History:  Procedure Laterality Date  . AV FISTULA PLACEMENT  2009  . BACK SURGERY    . CARDIOVERSION N/A 11/13/2015   Procedure: CARDIOVERSION;  Surgeon: Troy Sine, MD;  Location[facility] ENDOSCOPY;  Service: Cardiovascular;  Laterality: N/A;  . CARDIOVERSION N/A 01/13/2016   Procedure: CARDIOVERSION;  Surgeon:  Will Meredith [name], MD;  Location: [place];  Service: Cardiovascular;  Laterality: N/A;  . COLONOSCOPY WITH PROPOFOL N/A 01/05/2018   Procedure: COLONOSCOPY WITH PROPOFOL;  Surgeon: Ladene Artist, MD;  Location: [facility] ENDOSCOPY;  Service: Endoscopy;  Laterality: N/A;  . CORNEAL TRANSPLANT  1999   right eye  . CYSTOSCOPY  several times   kidney stones  . CYSTOSCOPY W/ RETROGRADES Bilateral 12/13/2017   Procedure: CYSTOSCOPY WITH RETROGRADE PYELOGRAM;  Surgeon: Irine Seal, MD;  Location: [facility] ORS;  Service: Urology;  Laterality: Bilateral;  . EP IMPLANTABLE DEVICE N/A 03/11/2015   Procedure: Pacemaker Implant;  Surgeon: Will Meredith [name], MD;  [place];  Laterality: Left  . LAMINECTOMY  1969  . POLYPECTOMY  01/05/2018   Procedure: POLYPECTOMY;  Surgeon: Ladene Artist, MD;  Location: [facility] ENDOSCOPY;  Service: Endoscopy;;  . RIGHT/LEFT HEART CATH AND CORONARY ANGIOGRAPHY N/A 08/20/2016   Procedure: Right/Left Heart Cath and Coronary Angiography;  Surgeon: Burnell Blanks, MD;  Location: [facility] CV LAB;  Service: Cardiovascular;  Laterality: N/A;  . ROBOT ASSITED LAPAROSCOPIC NEPHROURETERECTOMY Bilateral 02/17/2018   Procedure: XI ROBOT ASSITED LAPAROSCOPIC NEPHROURETERECTOMY;  Surgeon: Alexis Frock, MD;  Location: [facility] ORS;  Service: Urology;  Laterality: Bilateral;  . TEE WITHOUT CARDIOVERSION N/A 07/22/2016   Procedure: TRANSESOPHAGEAL ECHOCARDIOGRAM (TEE);  Surgeon: Skeet Latch, MD;  Location: [place];  Service: Cardiovascular;  Laterality: N/A;  . TEE WITHOUT CARDIOVERSION N/A 08/31/2016   Procedure: TRANSESOPHAGEAL ECHOCARDIOGRAM (TEE);  Surgeon: Burnell Blanks, MD;  Location: [place];  Service: Open Heart Surgery;  Laterality: N/A;  . TEE WITHOUT CARDIOVERSION N/A 12/01/2016   Procedure: TRANSESOPHAGEAL ECHOCARDIOGRAM (TEE);  Surgeon: Larey Dresser, MD;  Location: [facility] ENDOSCOPY;  Service: Cardiovascular;  Laterality: N/A;  .  TONSILLECTOMY  1964  . TRANSCATHETER AORTIC VALVE REPLACEMENT, TRANSFEMORAL N/A 08/31/2016   Procedure: TRANSCATHETER AORTIC VALVE REPLACEMENT, TRANSFEMORAL;  Surgeon: Burnell Blanks, MD;  Location: [place];  Service: Open Heart Surgery;  Laterality: N/A;  . TRANSURETHRAL RESECTION OF BLADDER TUMOR N/A 12/13/2017   Procedure: TRANSURETHRAL RESECTION OF BLADDER TUMOR (TURBT);  Surgeon: Irine Seal, MD;  Location: [facility] ORS;  Service: Urology;  Laterality: N/A;        Home Medications    Prior to Admission medications   Medication Sig Start Date End Date Taking? Authorizing Provider  acetaminophen (TYLENOL) 325 MG tablet Take 1-2 tablets (325-650 mg total) by mouth every 4 (four) hours as needed for mild pain. 02/28/18   Love, Ivan Anchors, PA-C  b complex-vitamin c-folic acid (NEPHRO-VITE) 0.8 MG TABS Take 1 tablet by mouth daily.     [provider]  bromocriptine (PARLODEL) 5 MG capsule Take 5 mg by mouth every other day.  11/30/10   [provider]  cetirizine (ZYRTEC) 10 MG tablet Take 10 mg by mouth at bedtime.     [provider]  cinacalcet (SENSIPAR) 60 MG tablet Take 60 mg by mouth at bedtime.    [provider]  [place] 125 MCG tablet TAKE 1/2 TABLET BY MOUTH EVERY DAY Patient taking differently: Take 0.0625 mg by mouth daily.  09/08/17   Skeet Latch, MD  diltiazem (CARDIZEM) 120 MG tablet Take 120 mg by mouth daily.    [provider]  docusate sodium (COLACE) 100 MG capsule Take 1 capsule (100 mg total) by mouth 2 (two) times daily. Patient not taking: Reported on 03/07/2018 03/03/18   Love, Ivan Anchors, PA-C  dorzolamide-timolol (COSOPT) 22.3-6.8 MG/ML ophthalmic solution Place 1 drop into both eyes 2 (two) times daily.  08/13/13   [provider]  gabapentin (NEURONTIN) 100 MG capsule Take 200 mg by mouth at bedtime.     [provider]  latanoprost (XALATAN) 0.005 % ophthalmic solution Place 1 drop into both eyes at  bedtime.    [provider]  midodrine (PROAMATINE) 5 MG tablet Take 5 mg by mouth as directed. TAKE 1 TO 3 TABLETS DAILY AS NEEDED FOR LOW BLOOD PRESSURE    [provider]  Nutritional Supplements (NEPRO) LIQD Take 237 mLs by mouth every Monday, Wednesday, and Friday with hemodialysis.     [provider]  omeprazole (PRILOSEC) 20 MG capsule Take 20 mg by mouth daily.    [provider]  oxyCODONE (OXY IR/ROXICODONE) 5 MG immediate release tablet Take 1 tablet (5 mg total) by mouth 2 (two) times daily as needed for severe pain. 03/07/18   Skeet Latch, MD  pravastatin (PRAVACHOL) 10 MG tablet Take 1 tablet (10 mg total) by mouth  daily. 08/08/17   Skeet Latch, MD  predniSONE (DELTASONE) 5 MG tablet Take 5 mg by mouth at bedtime.  04/27/16   [provider]  senna-docusate (SENOKOT-S) 8.6-50 MG tablet Take 2 tablets by mouth at bedtime. 03/03/18   Love, Ivan Anchors, PA-C  sertraline (ZOLOFT) 50 MG tablet Take 50 mg by mouth daily. 11/18/16   [provider]  sevelamer carbonate (RENVELA) 800 MG tablet Take 2 tablets (1,600 mg total) by mouth 3 (three) times daily with meals. 11/02/17   Shelly Coss, MD  warfarin (COUMADIN) 1 MG tablet Take 1 tablet (1 mg total) by mouth daily at 6 PM. Start tomorrow 03/04/18   Love, Ivan Anchors, PA-C    Family History Family History  Problem Relation Age of Onset  . Stomach cancer Mother   . Hypertension Father        Died of heart attack  . Heart attack Father   . Stroke Sister   . Heart disease Sister   . Cancer Brother   . Colon cancer Neg Hx     Social History Social History   Tobacco Use  . Smoking status: Former Smoker    Packs/day: 2.00    Years: 45.00    Pack years: 90.00    Types: Cigarettes    Last attempt to quit: 01/20/1998    Years since quitting: 20.1  . Smokeless tobacco: Never Used  Substance Use Topics  . Alcohol use: No    Alcohol/week: 0.0 standard drinks  . Drug use:  No     Allergies   Penicillins; Atorvastatin; Codeine; and Tramadol   Review of Systems Review of Systems  Unable to perform ROS: Intubated     Physical Exam Updated Vital Signs Ht 5\' 10"  (1.778 m)   Wt 86.6 kg   BMI 27.39 kg/m   Physical Exam Constitutional:      General: He is in acute distress.     Appearance: He is ill-appearing.     Interventions: He is intubated. Cervical collar in place.  HENT:     Head: Normocephalic and atraumatic.     Mouth/Throat:     Mouth: Mucous membranes are dry.     Pharynx: No posterior oropharyngeal erythema.  Eyes:     Pupils:     Right eye: Pupil is not reactive.     Left eye: Pupil is not reactive.  Neck:     Musculoskeletal: Neck supple.     Trachea: Trachea normal.  Cardiovascular:     Pulses: Decreased pulses.          Carotid pulses are 0 on the right side and 0 on the left side.      Radial pulses are 0 on the right side and 0 on the left side.       Dorsalis pedis pulses are 0 on the right side and 0 on the left side.  Pulmonary:     Effort: He is intubated.     Breath sounds: No decreased air movement. Decreased breath sounds present. No wheezing.  Chest:     Chest wall: No lacerations or crepitus.  Abdominal:     General: There is no distension.     Palpations: Abdomen is soft.  Musculoskeletal:        General: No swelling.     Right lower leg: No edema.     Left lower leg: No edema.  Skin:    Findings: No rash.  Neurological:     GCS: GCS  eye subscore is 1. GCS verbal subscore is 1. GCS motor subscore is 1.      ED Treatments / Results  Labs (all labs ordered are listed, but only abnormal results are displayed) Labs Reviewed - No data to display  EKG None  Radiology No results found.  Procedures .Critical Care Performed by: Lennice Sites, DO Authorized by: Lennice Sites, DO   Critical care provider statement:    Critical care time (minutes):  35   Critical care was necessary to treat or  prevent imminent or life-threatening deterioration of the following conditions:  Cardiac failure   Critical care was time spent personally by me on the following activities:  Development of treatment plan with patient or surrogate, obtaining history from patient or surrogate, interpretation of cardiac output measurements, evaluation of patient's response to treatment, re-evaluation of patient's condition and pulse oximetry   I assumed direction of critical care for this patient from another provider in my specialty: no     (including critical care time)  Medications Ordered in ED Medications - No data to display    EMERGENCY DEPARTMENT [place] CARDIAC EXAM "Study: Limited Ultrasound of the Heart and Pericardium"  INDICATIONS:Cardiac arrest Multiple views of the heart and pericardium were obtained in real-time with a multi-frequency probe.  PERFORMED HY:[id number] IMAGES ARCHIVED?: No LIMITATIONS:  Emergent procedure VIEWS USED: Subcostal 4 chamber INTERPRETATION: Cardiac activity absent and No coordinated contractions   Initial Impression / Assessment and Plan / ED Course  I have reviewed the triage vital signs and the nursing notes.  Pertinent labs & imaging results that were available during my care of the patient were reviewed by me and considered in my medical decision making (see chart for details).     Alexander [name] is a 78 year old [name] with history of end-stage renal disease on hemodialysis, bladder cancer/renal cancer status post surgery who presents to the ED in cardiac arrest.  Patient with around 80 minutes of CPR, 15 rounds of epinephrine, amiodarone, calcium, bicarb with no return of spontaneous circulation.  Talked with EMS prior to arrival about given him calcium bicarb but did not have any improvement.  Patient was shocked 2-3 times for ventricular fibrillation but for over the last 45 minutes patient has been in PEA.  Patient did have recent surgery.  History of renal disease.   Did have dialysis yesterday.  Upon arrival pulse check showed that patient had no pulses.  He was still in PEA.  Patient on bedside ultrasound showed no cardiac activity.  At that time patient was pronounced deceased at 67.  Talked on the phone with his primary care provider Dr. Shelia Media who will fill out death certificate.  No concern for trauma and family does not want autopsy.  No need for medical examiner involvement.  Multiple reasons for organic reason for death.  Family was in agreement with this plan.  Talked at length with family about today's events and they are aware of his death.  This chart was dictated using voice recognition software.  Despite best efforts to proofread,  errors can occur which can change the documentation meaning.   Final Clinical Impressions(s) / ED Diagnoses   Final diagnoses:  Cardiac arrest [facility])    ED Discharge Orders    None       Lennice Sites, DO 04-13-2018 1533

## 2018-03-29 NOTE — ED Notes (Signed)
Family arrived. Wife and 3 daughters are in consult room A. Remainder of family in lobby

## 2018-03-29 NOTE — ED Triage Notes (Addendum)
[facility] from home. Spouse reports that she heard patient fall and when she went into bathroom found patient unresponsive. Fire arrived 1st and bagged and shocked x 1 by AED. EMS arrived and administered 3 additional shocks pta.  Patient received the following PTA: Amiodarone     450 Epi                     10   Amps Calcium              1  Amp Sodium Bicarb    1 Amp [place] IO  Left tib/fib  CPR was ongoing over 1 hour prior to EMS arrival. On arrival ongoing CPR with no response No pulses on arrival, no cardiac activity per U/S  On arrival ongoing CPR with no response   CPR  D/C'd     1303

## 2018-03-29 DEATH — deceased

## 2018-04-02 LAB — CUP PACEART REMOTE DEVICE CHECK
Battery Remaining Longevity: 122 mo
Battery Remaining Percentage: 95.5 %
Battery Voltage: 3.01 V
Implantable Lead Implant Date: 20161213
Implantable Lead Implant Date: 20161213
Implantable Lead Location: 753859
Implantable Lead Location: 753860
Implantable Pulse Generator Implant Date: 20161213
Lead Channel Impedance Value: 430 Ohm
Lead Channel Pacing Threshold Amplitude: 1 V
Lead Channel Pacing Threshold Pulse Width: 0.5 ms
Lead Channel Sensing Intrinsic Amplitude: 9.6 mV
Lead Channel Setting Pacing Amplitude: 2.5 V
Lead Channel Setting Pacing Pulse Width: 0.5 ms
Lead Channel Setting Sensing Sensitivity: 2 mV
MDC IDC SESS DTM: 20191105070032
MDC IDC STAT BRADY RV PERCENT PACED: 60 %
Pulse Gen Serial Number: 7839215

## 2018-04-18 ENCOUNTER — Ambulatory Visit: Payer: Medicare Other | Admitting: Physical Medicine & Rehabilitation

## 2018-06-01 IMAGING — DX DG CHEST 2V
3 series · 3 of 3 positions shown · non-contrast
Comparison: 04/28/2017

CLINICAL DATA: Syncopal episode

EXAM:
CHEST  2 VIEW

[chest lat (1 of 2)]
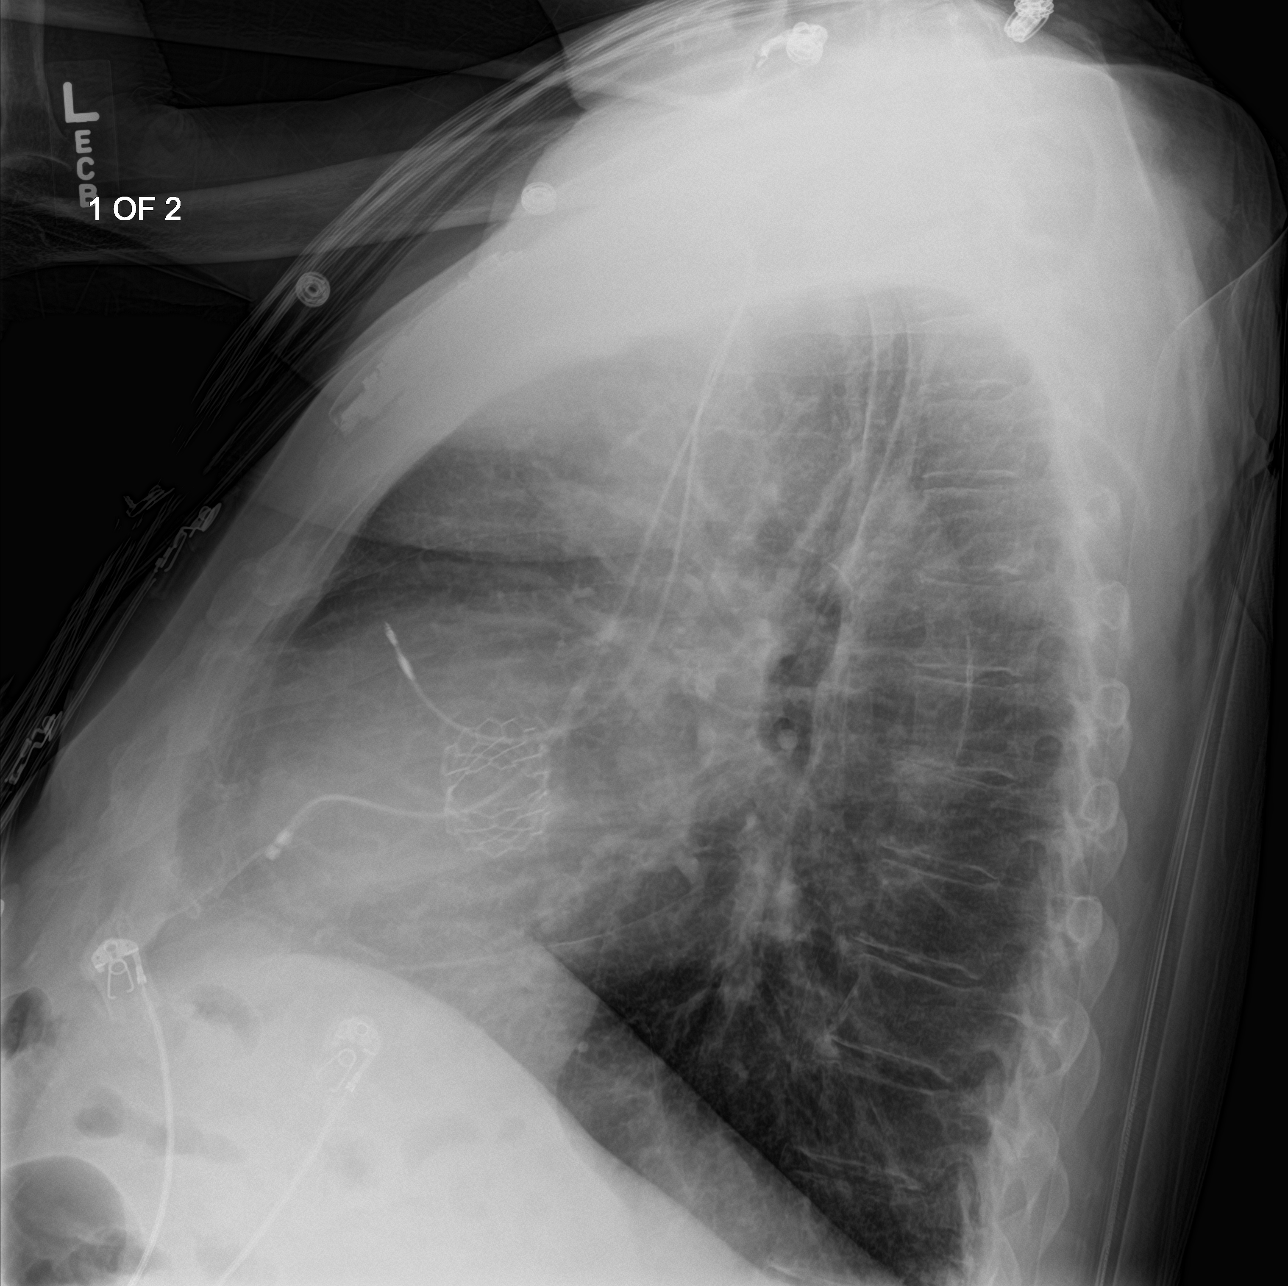

[chest ap]
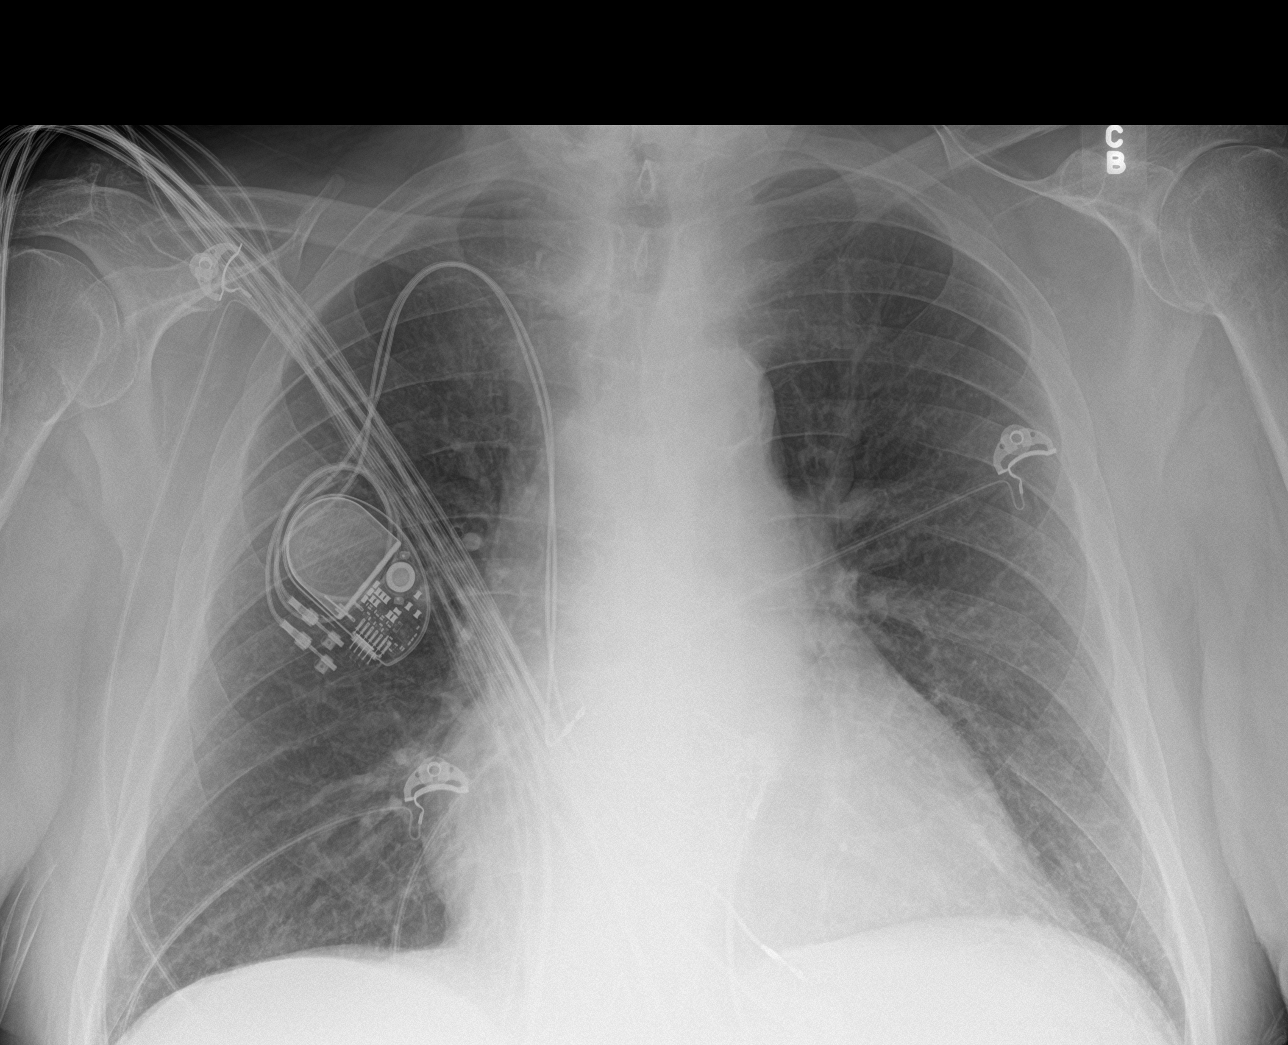

[chest lat (2 of 2)]
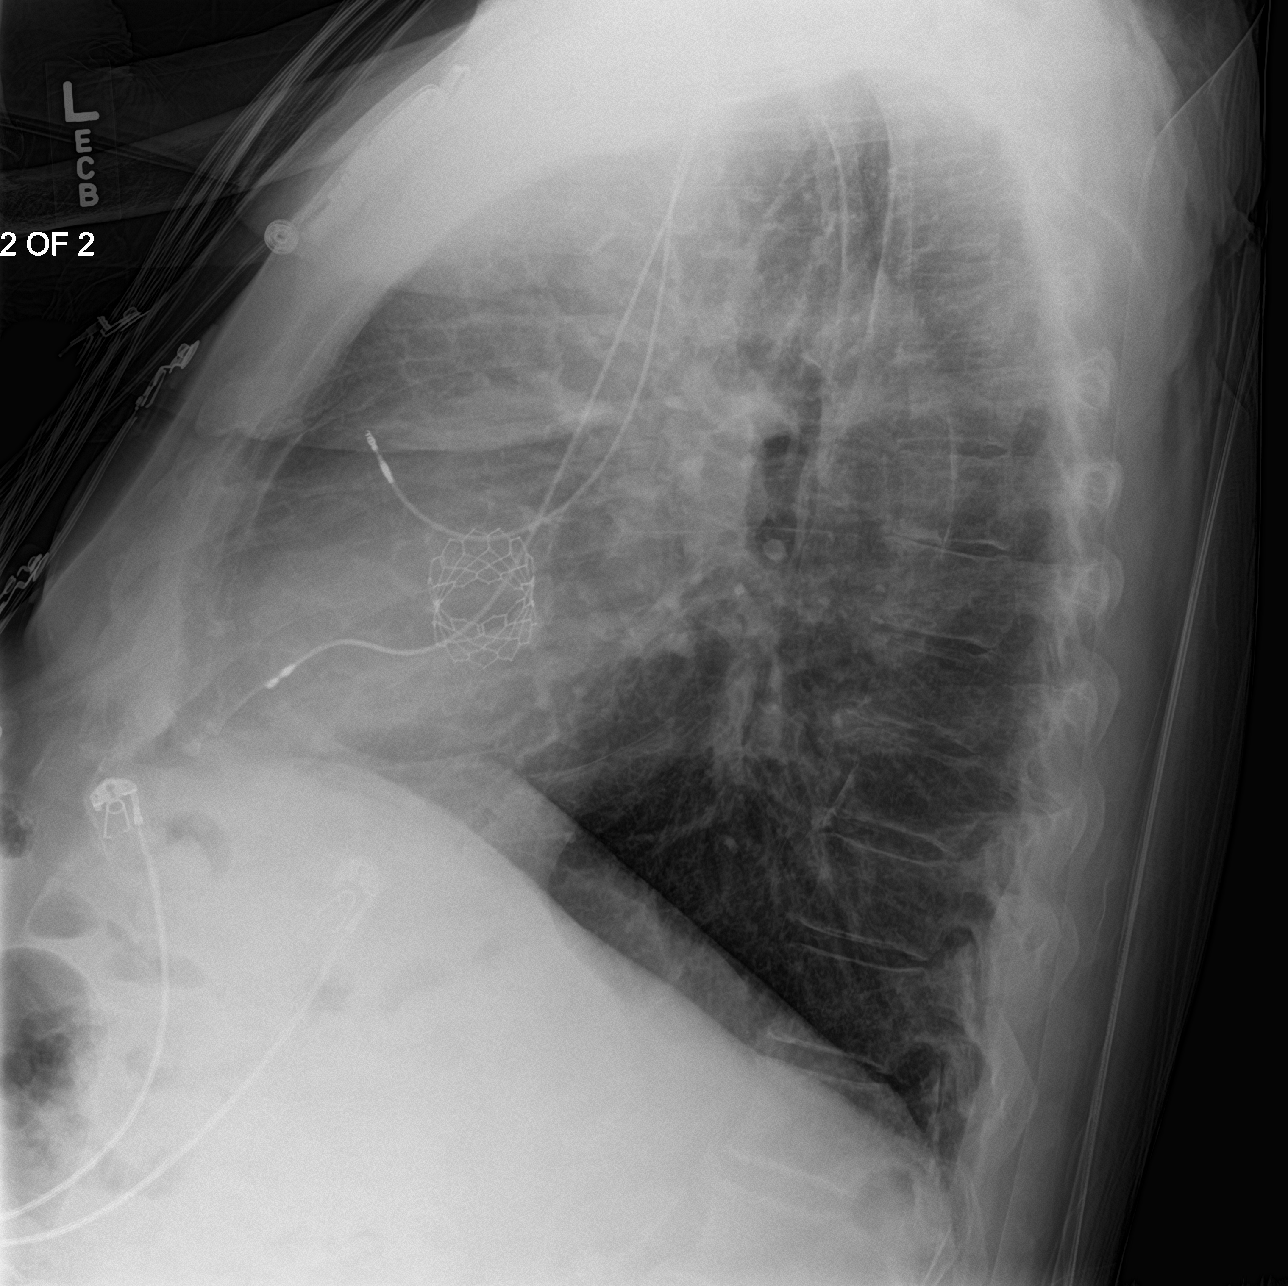

[3 of 3 positions shown; findings below may reference images not displayed]

FINDINGS: Cardiac shadow is enlarged. Pacing device is again seen. Changes
consistent with prior TAVR are noted and stable. The lungs are well
aerated bilaterally. No focal infiltrate or sizable effusion is
seen. No bony abnormality is noted.
IMPRESSION: No acute abnormality noted.

## 2018-06-06 ENCOUNTER — Ambulatory Visit: Payer: Medicare Other | Admitting: Cardiovascular Disease
